# Patient Record
Sex: Female | Born: 1950
Health system: Southern US, Community
[De-identification: ages and names within clinical notes are randomized; demographics above are authoritative.]

## PROBLEM LIST (undated history)

## (undated) DIAGNOSIS — M1990 Unspecified osteoarthritis, unspecified site: Secondary | ICD-10-CM

## (undated) DIAGNOSIS — I2510 Atherosclerotic heart disease of native coronary artery without angina pectoris: Secondary | ICD-10-CM

## (undated) DIAGNOSIS — J449 Chronic obstructive pulmonary disease, unspecified: Secondary | ICD-10-CM

## (undated) DIAGNOSIS — IMO0002 Reserved for concepts with insufficient information to code with codable children: Secondary | ICD-10-CM

## (undated) DIAGNOSIS — D649 Anemia, unspecified: Secondary | ICD-10-CM

## (undated) DIAGNOSIS — R7989 Other specified abnormal findings of blood chemistry: Secondary | ICD-10-CM

## (undated) DIAGNOSIS — R748 Abnormal levels of other serum enzymes: Secondary | ICD-10-CM

## (undated) DIAGNOSIS — Z8719 Personal history of other diseases of the digestive system: Secondary | ICD-10-CM

## (undated) DIAGNOSIS — N390 Urinary tract infection, site not specified: Secondary | ICD-10-CM

## (undated) DIAGNOSIS — R0602 Shortness of breath: Secondary | ICD-10-CM

## (undated) DIAGNOSIS — E039 Hypothyroidism, unspecified: Secondary | ICD-10-CM

## (undated) DIAGNOSIS — I10 Essential (primary) hypertension: Secondary | ICD-10-CM

## (undated) DIAGNOSIS — E7800 Pure hypercholesterolemia, unspecified: Secondary | ICD-10-CM

## (undated) DIAGNOSIS — R9430 Abnormal result of cardiovascular function study, unspecified: Secondary | ICD-10-CM

## (undated) DIAGNOSIS — C801 Malignant (primary) neoplasm, unspecified: Secondary | ICD-10-CM

## (undated) DIAGNOSIS — M419 Scoliosis, unspecified: Secondary | ICD-10-CM

## (undated) DIAGNOSIS — I253 Aneurysm of heart: Secondary | ICD-10-CM

## (undated) DIAGNOSIS — G589 Mononeuropathy, unspecified: Secondary | ICD-10-CM

## (undated) DIAGNOSIS — F329 Major depressive disorder, single episode, unspecified: Secondary | ICD-10-CM

## (undated) DIAGNOSIS — F32A Depression, unspecified: Secondary | ICD-10-CM

## (undated) DIAGNOSIS — E785 Hyperlipidemia, unspecified: Secondary | ICD-10-CM

## (undated) DIAGNOSIS — K219 Gastro-esophageal reflux disease without esophagitis: Secondary | ICD-10-CM

## (undated) HISTORY — DX: Mononeuropathy, unspecified: G58.9

## (undated) HISTORY — DX: Aneurysm of heart: I25.3

## (undated) HISTORY — DX: Malignant (primary) neoplasm, unspecified: C80.1

## (undated) HISTORY — PX: OOPHORECTOMY: SHX86

## (undated) HISTORY — PX: BACK SURGERY: SHX140

## (undated) HISTORY — DX: Abnormal result of cardiovascular function study, unspecified: R94.30

## (undated) HISTORY — DX: Hyperlipidemia, unspecified: E78.5

## (undated) HISTORY — DX: Pure hypercholesterolemia, unspecified: E78.00

## (undated) HISTORY — DX: Essential (primary) hypertension: I10

## (undated) HISTORY — DX: Scoliosis, unspecified: M41.9

## (undated) HISTORY — DX: Abnormal levels of other serum enzymes: R74.8

## (undated) HISTORY — DX: Other specified abnormal findings of blood chemistry: R79.89

## (undated) HISTORY — DX: Atherosclerotic heart disease of native coronary artery without angina pectoris: I25.10

## (undated) HISTORY — PX: ANKLE SURGERY: SHX546

## (undated) HISTORY — DX: Hypothyroidism, unspecified: E03.9

## (undated) HISTORY — DX: Chronic obstructive pulmonary disease, unspecified: J44.9

## (undated) HISTORY — DX: Reserved for concepts with insufficient information to code with codable children: IMO0002

## (undated) HISTORY — DX: Shortness of breath: R06.02

---

## 1985-04-25 HISTORY — PX: TOTAL ABDOMINAL HYSTERECTOMY: SHX209

## 1985-04-25 HISTORY — PX: APPENDECTOMY: SHX54

## 1987-04-26 HISTORY — PX: PELVIC LAPAROSCOPY: SHX162

## 1998-05-14 ENCOUNTER — Other Ambulatory Visit: Admission: RE | Admit: 1998-05-14 | Discharge: 1998-05-14 | Payer: Self-pay | Admitting: Obstetrics and Gynecology

## 1999-09-01 ENCOUNTER — Other Ambulatory Visit: Admission: RE | Admit: 1999-09-01 | Discharge: 1999-09-01 | Payer: Self-pay | Admitting: Obstetrics and Gynecology

## 1999-11-26 ENCOUNTER — Other Ambulatory Visit: Admission: RE | Admit: 1999-11-26 | Discharge: 1999-11-26 | Payer: Self-pay | Admitting: Orthopaedic Surgery

## 2000-04-25 DIAGNOSIS — C801 Malignant (primary) neoplasm, unspecified: Secondary | ICD-10-CM

## 2000-04-25 HISTORY — DX: Malignant (primary) neoplasm, unspecified: C80.1

## 2000-09-28 ENCOUNTER — Other Ambulatory Visit: Admission: RE | Admit: 2000-09-28 | Discharge: 2000-09-28 | Payer: Self-pay | Admitting: Obstetrics and Gynecology

## 2001-04-25 HISTORY — PX: BREAST LUMPECTOMY: SHX2

## 2001-04-25 HISTORY — PX: CATARACT EXTRACTION: SUR2

## 2001-11-02 ENCOUNTER — Encounter: Payer: Self-pay | Admitting: General Surgery

## 2001-11-07 ENCOUNTER — Ambulatory Visit (HOSPITAL_COMMUNITY): Admission: RE | Admit: 2001-11-07 | Discharge: 2001-11-07 | Payer: Self-pay | Admitting: General Surgery

## 2001-11-07 ENCOUNTER — Encounter (INDEPENDENT_AMBULATORY_CARE_PROVIDER_SITE_OTHER): Payer: Self-pay | Admitting: *Deleted

## 2001-11-07 ENCOUNTER — Encounter: Admission: RE | Admit: 2001-11-07 | Discharge: 2001-11-07 | Payer: Self-pay | Admitting: General Surgery

## 2001-11-07 ENCOUNTER — Encounter: Payer: Self-pay | Admitting: General Surgery

## 2001-11-26 ENCOUNTER — Ambulatory Visit: Admission: RE | Admit: 2001-11-26 | Discharge: 2002-02-01 | Payer: Self-pay | Admitting: *Deleted

## 2002-02-22 ENCOUNTER — Other Ambulatory Visit: Admission: RE | Admit: 2002-02-22 | Discharge: 2002-02-22 | Payer: Self-pay | Admitting: Obstetrics and Gynecology

## 2002-09-26 ENCOUNTER — Encounter: Payer: Self-pay | Admitting: Internal Medicine

## 2002-09-26 ENCOUNTER — Inpatient Hospital Stay (HOSPITAL_COMMUNITY): Admission: RE | Admit: 2002-09-26 | Discharge: 2002-09-29 | Payer: Self-pay | Admitting: Internal Medicine

## 2003-02-21 ENCOUNTER — Ambulatory Visit: Admission: RE | Admit: 2003-02-21 | Discharge: 2003-02-21 | Payer: Self-pay | Admitting: *Deleted

## 2003-07-01 ENCOUNTER — Other Ambulatory Visit: Admission: RE | Admit: 2003-07-01 | Discharge: 2003-07-01 | Payer: Self-pay | Admitting: Obstetrics and Gynecology

## 2003-07-31 ENCOUNTER — Inpatient Hospital Stay (HOSPITAL_COMMUNITY): Admission: AD | Admit: 2003-07-31 | Discharge: 2003-08-02 | Payer: Self-pay | Admitting: Internal Medicine

## 2004-03-31 ENCOUNTER — Ambulatory Visit: Payer: Self-pay | Admitting: Internal Medicine

## 2004-04-09 ENCOUNTER — Ambulatory Visit: Payer: Self-pay | Admitting: Internal Medicine

## 2004-04-28 ENCOUNTER — Ambulatory Visit: Payer: Self-pay | Admitting: *Deleted

## 2004-08-23 ENCOUNTER — Encounter (INDEPENDENT_AMBULATORY_CARE_PROVIDER_SITE_OTHER): Payer: Self-pay | Admitting: *Deleted

## 2004-08-23 LAB — CONVERTED CEMR LAB

## 2004-09-06 ENCOUNTER — Ambulatory Visit: Payer: Self-pay | Admitting: Internal Medicine

## 2004-09-15 ENCOUNTER — Ambulatory Visit: Payer: Self-pay | Admitting: Internal Medicine

## 2005-02-07 ENCOUNTER — Ambulatory Visit: Payer: Self-pay | Admitting: Oncology

## 2005-03-25 ENCOUNTER — Ambulatory Visit: Payer: Self-pay | Admitting: Oncology

## 2005-05-09 ENCOUNTER — Ambulatory Visit: Payer: Self-pay | Admitting: Internal Medicine

## 2005-08-18 ENCOUNTER — Other Ambulatory Visit: Admission: RE | Admit: 2005-08-18 | Discharge: 2005-08-18 | Payer: Self-pay | Admitting: Obstetrics and Gynecology

## 2005-08-29 ENCOUNTER — Ambulatory Visit: Payer: Self-pay | Admitting: *Deleted

## 2005-09-07 ENCOUNTER — Ambulatory Visit: Payer: Self-pay

## 2005-09-07 ENCOUNTER — Ambulatory Visit: Payer: Self-pay | Admitting: *Deleted

## 2005-09-07 HISTORY — PX: CARDIOVASCULAR STRESS TEST: SHX262

## 2006-01-26 ENCOUNTER — Ambulatory Visit: Payer: Self-pay | Admitting: Internal Medicine

## 2006-03-01 ENCOUNTER — Ambulatory Visit: Payer: Self-pay | Admitting: Internal Medicine

## 2006-03-23 ENCOUNTER — Ambulatory Visit: Payer: Self-pay | Admitting: Internal Medicine

## 2006-03-23 ENCOUNTER — Ambulatory Visit: Payer: Self-pay | Admitting: Oncology

## 2006-04-17 LAB — CBC WITH DIFFERENTIAL/PLATELET
BASO%: 0.4 % (ref 0.0–2.0)
EOS%: 12.4 % — ABNORMAL HIGH (ref 0.0–7.0)
MCH: 30.6 pg (ref 26.0–34.0)
MCHC: 33.3 g/dL (ref 32.0–36.0)
MCV: 92 fL (ref 81.0–101.0)
MONO%: 7.2 % (ref 0.0–13.0)
NEUT%: 61.8 % (ref 39.6–76.8)
RDW: 12.8 % (ref 11.3–14.5)
lymph#: 1.2 10*3/uL (ref 0.9–3.3)

## 2006-05-19 DIAGNOSIS — G43909 Migraine, unspecified, not intractable, without status migrainosus: Secondary | ICD-10-CM | POA: Insufficient documentation

## 2006-07-26 ENCOUNTER — Ambulatory Visit: Payer: Self-pay | Admitting: *Deleted

## 2006-08-09 ENCOUNTER — Ambulatory Visit: Payer: Self-pay | Admitting: *Deleted

## 2006-08-09 LAB — CONVERTED CEMR LAB
Bilirubin, Direct: 0.1 mg/dL (ref 0.0–0.3)
Cholesterol: 167 mg/dL (ref 0–200)
GFR calc Af Amer: 111 mL/min
GFR calc non Af Amer: 92 mL/min
Glucose, Bld: 96 mg/dL (ref 70–99)
HDL: 48.5 mg/dL (ref >39.0)
LDL Cholesterol: 97 mg/dL (ref 0–99)
Sodium: 144 meq/L (ref 135–145)
Total CHOL/HDL Ratio: 3.4
Triglycerides: 106 mg/dL (ref 0–149)

## 2006-09-01 ENCOUNTER — Ambulatory Visit: Payer: Self-pay | Admitting: Internal Medicine

## 2006-09-01 LAB — CONVERTED CEMR LAB
BUN: 14 mg/dL (ref 6–23)
Basophils Absolute: 0 10*3/uL (ref 0.0–0.1)
Cholesterol: 154 mg/dL (ref 0–200)
Eosinophils Absolute: 0.6 10*3/uL (ref 0.0–0.6)
HCT: 39.9 % (ref 36.0–46.0)
Lymphocytes Relative: 14.5 % (ref 12.0–46.0)
MCHC: 33.1 g/dL (ref 30.0–36.0)
MCV: 91.2 fL (ref 78.0–100.0)
Neutro Abs: 4.2 10*3/uL (ref 1.4–7.7)
Neutrophils Relative %: 67.3 % (ref 43.0–77.0)
RBC: 4.38 M/uL (ref 3.87–5.11)
TSH: 3.32 microintl units/mL (ref 0.35–5.50)
Total CHOL/HDL Ratio: 3.2
Triglycerides: 74 mg/dL (ref 0–149)

## 2006-10-04 ENCOUNTER — Encounter: Payer: Self-pay | Admitting: Internal Medicine

## 2006-10-12 ENCOUNTER — Telehealth (INDEPENDENT_AMBULATORY_CARE_PROVIDER_SITE_OTHER): Payer: Self-pay | Admitting: *Deleted

## 2006-11-15 ENCOUNTER — Other Ambulatory Visit: Admission: RE | Admit: 2006-11-15 | Discharge: 2006-11-15 | Payer: Self-pay | Admitting: Obstetrics and Gynecology

## 2006-11-22 ENCOUNTER — Ambulatory Visit: Payer: Self-pay | Admitting: Cardiology

## 2006-11-22 LAB — CONVERTED CEMR LAB
Cholesterol: 149 mg/dL (ref 0–200)
HDL: 46 mg/dL (ref >39.0)
TSH: 5.97 microintl units/mL — ABNORMAL HIGH (ref 0.35–5.50)
Total CHOL/HDL Ratio: 3.2
Triglycerides: 65 mg/dL (ref 0–149)

## 2007-01-10 ENCOUNTER — Ambulatory Visit: Payer: Self-pay | Admitting: Internal Medicine

## 2007-02-08 ENCOUNTER — Ambulatory Visit: Payer: Self-pay | Admitting: Cardiology

## 2007-02-13 ENCOUNTER — Ambulatory Visit: Payer: Self-pay | Admitting: Cardiology

## 2007-02-13 ENCOUNTER — Ambulatory Visit: Payer: Self-pay

## 2007-02-13 ENCOUNTER — Encounter: Payer: Self-pay | Admitting: Cardiology

## 2007-02-13 ENCOUNTER — Encounter: Payer: Self-pay | Admitting: Internal Medicine

## 2007-02-15 ENCOUNTER — Ambulatory Visit: Payer: Self-pay | Admitting: Cardiology

## 2007-03-07 ENCOUNTER — Ambulatory Visit: Payer: Self-pay | Admitting: Oncology

## 2007-03-12 ENCOUNTER — Encounter: Payer: Self-pay | Admitting: Internal Medicine

## 2007-03-12 LAB — CBC WITH DIFFERENTIAL/PLATELET
Basophils Absolute: 0 10*3/uL (ref 0.0–0.1)
EOS%: 14.2 % — ABNORMAL HIGH (ref 0.0–7.0)
Eosinophils Absolute: 0.9 10*3/uL — ABNORMAL HIGH (ref 0.0–0.5)
HCT: 38.1 % (ref 34.8–46.6)
HGB: 13 g/dL (ref 11.6–15.9)
MCH: 29.2 pg (ref 26.0–34.0)
MONO#: 0.4 10*3/uL (ref 0.1–0.9)
NEUT#: 4.1 10*3/uL (ref 1.5–6.5)
NEUT%: 64.6 % (ref 39.6–76.8)
lymph#: 1 10*3/uL (ref 0.9–3.3)

## 2007-03-12 LAB — COMPREHENSIVE METABOLIC PANEL
BUN: 18 mg/dL (ref 6–23)
CO2: 24 mEq/L (ref 19–32)
Calcium: 9.7 mg/dL (ref 8.4–10.5)
Chloride: 107 mEq/L (ref 96–112)
Creatinine, Ser: 0.69 mg/dL (ref 0.40–1.20)
Glucose, Bld: 94 mg/dL (ref 70–99)

## 2007-03-12 LAB — LACTATE DEHYDROGENASE: LDH: 234 U/L (ref 94–250)

## 2007-03-28 ENCOUNTER — Telehealth (INDEPENDENT_AMBULATORY_CARE_PROVIDER_SITE_OTHER): Payer: Self-pay | Admitting: *Deleted

## 2007-04-05 ENCOUNTER — Ambulatory Visit: Payer: Self-pay | Admitting: Internal Medicine

## 2007-04-09 ENCOUNTER — Encounter (INDEPENDENT_AMBULATORY_CARE_PROVIDER_SITE_OTHER): Payer: Self-pay | Admitting: *Deleted

## 2007-08-09 ENCOUNTER — Ambulatory Visit: Payer: Self-pay | Admitting: Internal Medicine

## 2007-08-22 ENCOUNTER — Encounter: Payer: Self-pay | Admitting: Internal Medicine

## 2007-10-16 ENCOUNTER — Encounter: Payer: Self-pay | Admitting: Internal Medicine

## 2007-12-07 ENCOUNTER — Encounter
Admission: RE | Admit: 2007-12-07 | Discharge: 2008-03-06 | Payer: Self-pay | Admitting: Physical Medicine & Rehabilitation

## 2007-12-11 ENCOUNTER — Ambulatory Visit: Payer: Self-pay | Admitting: Physical Medicine & Rehabilitation

## 2008-01-14 ENCOUNTER — Ambulatory Visit: Payer: Self-pay | Admitting: Physical Medicine & Rehabilitation

## 2008-01-23 ENCOUNTER — Ambulatory Visit: Payer: Self-pay | Admitting: Cardiology

## 2008-01-23 LAB — CONVERTED CEMR LAB
Albumin: 3.9 g/dL (ref 3.5–5.2)
HDL: 57.8 mg/dL (ref >39.0)
LDL Cholesterol: 95 mg/dL (ref 0–99)
TSH: 6.02 microintl units/mL — ABNORMAL HIGH (ref 0.35–5.50)
Total Bilirubin: 0.8 mg/dL (ref 0.3–1.2)
Total CHOL/HDL Ratio: 3
Triglycerides: 91 mg/dL (ref 0–149)
VLDL: 18 mg/dL (ref 0–40)

## 2008-02-11 ENCOUNTER — Ambulatory Visit: Payer: Self-pay | Admitting: Physical Medicine & Rehabilitation

## 2008-02-19 ENCOUNTER — Ambulatory Visit: Payer: Self-pay | Admitting: Family Medicine

## 2008-02-22 ENCOUNTER — Ambulatory Visit: Payer: Self-pay | Admitting: Internal Medicine

## 2008-03-07 ENCOUNTER — Ambulatory Visit: Payer: Self-pay | Admitting: Oncology

## 2008-03-11 ENCOUNTER — Encounter: Payer: Self-pay | Admitting: Internal Medicine

## 2008-03-11 LAB — CBC WITH DIFFERENTIAL/PLATELET
BASO%: 0.5 % (ref 0.0–2.0)
Basophils Absolute: 0 10*3/uL (ref 0.0–0.1)
EOS%: 5.4 % (ref 0.0–7.0)
HGB: 13.5 g/dL (ref 11.6–15.9)
MCH: 31.5 pg (ref 26.0–34.0)
MCHC: 33.7 g/dL (ref 32.0–36.0)
RBC: 4.28 10*6/uL (ref 3.70–5.32)
RDW: 14.9 % — ABNORMAL HIGH (ref 11.3–14.5)
lymph#: 1 10*3/uL (ref 0.9–3.3)

## 2008-03-11 LAB — COMPREHENSIVE METABOLIC PANEL
ALT: 20 U/L (ref 0–35)
AST: 16 U/L (ref 0–37)
Albumin: 4.3 g/dL (ref 3.5–5.2)
Alkaline Phosphatase: 93 U/L (ref 39–117)
BUN: 14 mg/dL (ref 6–23)
CO2: 27 mEq/L (ref 19–32)
Calcium: 9.3 mg/dL (ref 8.4–10.5)
Chloride: 105 mEq/L (ref 96–112)
Creatinine, Ser: 0.78 mg/dL (ref 0.40–1.20)
Glucose, Bld: 90 mg/dL (ref 70–99)
Potassium: 4.2 mEq/L (ref 3.5–5.3)
Sodium: 143 mEq/L (ref 135–145)
Total Bilirubin: 0.6 mg/dL (ref 0.3–1.2)
Total Protein: 6.6 g/dL (ref 6.0–8.3)

## 2008-03-13 ENCOUNTER — Encounter
Admission: RE | Admit: 2008-03-13 | Discharge: 2008-03-13 | Payer: Self-pay | Admitting: Physical Medicine & Rehabilitation

## 2008-03-13 ENCOUNTER — Ambulatory Visit: Payer: Self-pay | Admitting: Physical Medicine & Rehabilitation

## 2008-03-17 ENCOUNTER — Encounter
Admission: RE | Admit: 2008-03-17 | Discharge: 2008-04-24 | Payer: Self-pay | Admitting: Physical Medicine & Rehabilitation

## 2008-03-18 ENCOUNTER — Encounter: Payer: Self-pay | Admitting: Internal Medicine

## 2008-06-23 ENCOUNTER — Ambulatory Visit: Payer: Self-pay | Admitting: Obstetrics and Gynecology

## 2008-07-02 ENCOUNTER — Ambulatory Visit: Payer: Self-pay | Admitting: Obstetrics and Gynecology

## 2008-07-21 ENCOUNTER — Ambulatory Visit: Payer: Self-pay | Admitting: Internal Medicine

## 2008-10-13 ENCOUNTER — Encounter
Admission: RE | Admit: 2008-10-13 | Discharge: 2008-10-31 | Payer: Self-pay | Admitting: Physical Medicine & Rehabilitation

## 2008-10-14 ENCOUNTER — Ambulatory Visit: Payer: Self-pay | Admitting: Physical Medicine & Rehabilitation

## 2008-10-23 ENCOUNTER — Ambulatory Visit: Payer: Self-pay | Admitting: Physical Medicine & Rehabilitation

## 2008-10-30 ENCOUNTER — Telehealth (INDEPENDENT_AMBULATORY_CARE_PROVIDER_SITE_OTHER): Payer: Self-pay | Admitting: *Deleted

## 2008-10-31 ENCOUNTER — Ambulatory Visit (HOSPITAL_COMMUNITY)
Admission: RE | Admit: 2008-10-31 | Discharge: 2008-10-31 | Payer: Self-pay | Admitting: Physical Medicine & Rehabilitation

## 2008-10-31 ENCOUNTER — Ambulatory Visit: Payer: Self-pay | Admitting: Physical Medicine & Rehabilitation

## 2008-11-21 ENCOUNTER — Ambulatory Visit: Payer: Self-pay | Admitting: Internal Medicine

## 2009-01-02 ENCOUNTER — Ambulatory Visit (HOSPITAL_COMMUNITY): Admission: RE | Admit: 2009-01-02 | Discharge: 2009-01-02 | Payer: Self-pay | Admitting: Orthopaedic Surgery

## 2009-01-06 ENCOUNTER — Encounter (INDEPENDENT_AMBULATORY_CARE_PROVIDER_SITE_OTHER): Payer: Self-pay | Admitting: *Deleted

## 2009-01-14 ENCOUNTER — Ambulatory Visit: Payer: Self-pay | Admitting: Obstetrics and Gynecology

## 2009-01-20 ENCOUNTER — Encounter
Admission: RE | Admit: 2009-01-20 | Discharge: 2009-04-20 | Payer: Self-pay | Admitting: Physical Medicine & Rehabilitation

## 2009-01-23 ENCOUNTER — Ambulatory Visit: Payer: Self-pay | Admitting: Physical Medicine & Rehabilitation

## 2009-02-19 ENCOUNTER — Encounter: Payer: Self-pay | Admitting: Cardiology

## 2009-02-20 ENCOUNTER — Ambulatory Visit: Payer: Self-pay | Admitting: Cardiology

## 2009-02-20 ENCOUNTER — Ambulatory Visit: Payer: Self-pay | Admitting: Physical Medicine & Rehabilitation

## 2009-02-25 LAB — CONVERTED CEMR LAB
ALT: 22 units/L (ref 0–35)
Albumin: 3.8 g/dL (ref 3.5–5.2)
Cholesterol: 216 mg/dL — ABNORMAL HIGH (ref 0–200)
Direct LDL: 154.8 mg/dL
HDL: 50.6 mg/dL (ref >39.00)
Total Protein: 6.5 g/dL (ref 6.0–8.3)
Triglycerides: 95 mg/dL (ref 0.0–149.0)
VLDL: 19 mg/dL (ref 0.0–40.0)

## 2009-03-17 ENCOUNTER — Encounter: Payer: Self-pay | Admitting: Internal Medicine

## 2009-03-20 ENCOUNTER — Ambulatory Visit: Payer: Self-pay | Admitting: Oncology

## 2009-03-24 ENCOUNTER — Encounter: Payer: Self-pay | Admitting: Internal Medicine

## 2009-03-24 LAB — CBC WITH DIFFERENTIAL/PLATELET
Basophils Absolute: 0 10*3/uL (ref 0.0–0.1)
Eosinophils Absolute: 0.5 10*3/uL (ref 0.0–0.5)
HCT: 39.5 % (ref 34.8–46.6)
HGB: 13.1 g/dL (ref 11.6–15.9)
MCV: 95.5 fL (ref 79.5–101.0)
MONO%: 8.4 % (ref 0.0–14.0)
NEUT#: 3 10*3/uL (ref 1.5–6.5)
NEUT%: 54.9 % (ref 38.4–76.8)
RDW: 13.8 % (ref 11.2–14.5)

## 2009-03-25 LAB — CANCER ANTIGEN 27.29: CA 27.29: 24 U/mL (ref 0–39)

## 2009-03-25 LAB — COMPREHENSIVE METABOLIC PANEL
Albumin: 4.1 g/dL (ref 3.5–5.2)
Alkaline Phosphatase: 81 U/L (ref 39–117)
BUN: 14 mg/dL (ref 6–23)
Calcium: 9.6 mg/dL (ref 8.4–10.5)
Chloride: 105 mEq/L (ref 96–112)
Creatinine, Ser: 0.64 mg/dL (ref 0.40–1.20)
Glucose, Bld: 88 mg/dL (ref 70–99)
Potassium: 4.5 mEq/L (ref 3.5–5.3)

## 2009-03-25 LAB — VITAMIN D 25 HYDROXY (VIT D DEFICIENCY, FRACTURES): Vit D, 25-Hydroxy: 22 ng/mL — ABNORMAL LOW (ref 30–89)

## 2009-04-14 ENCOUNTER — Ambulatory Visit: Payer: Self-pay | Admitting: Cardiology

## 2009-04-27 ENCOUNTER — Encounter: Payer: Self-pay | Admitting: Cardiology

## 2009-04-27 LAB — CONVERTED CEMR LAB
Albumin: 3.8 g/dL (ref 3.5–5.2)
Cholesterol: 158 mg/dL (ref 0–200)
LDL Cholesterol: 84 mg/dL (ref 0–99)
Total Bilirubin: 0.7 mg/dL (ref 0.3–1.2)
Triglycerides: 73 mg/dL (ref 0.0–149.0)
VLDL: 14.6 mg/dL (ref 0.0–40.0)

## 2009-05-15 ENCOUNTER — Telehealth (INDEPENDENT_AMBULATORY_CARE_PROVIDER_SITE_OTHER): Payer: Self-pay | Admitting: *Deleted

## 2009-05-20 ENCOUNTER — Encounter
Admission: RE | Admit: 2009-05-20 | Discharge: 2009-05-21 | Payer: Self-pay | Admitting: Physical Medicine & Rehabilitation

## 2009-05-21 ENCOUNTER — Ambulatory Visit: Payer: Self-pay | Admitting: Physical Medicine & Rehabilitation

## 2009-06-04 ENCOUNTER — Telehealth: Payer: Self-pay | Admitting: Internal Medicine

## 2009-06-17 ENCOUNTER — Encounter (INDEPENDENT_AMBULATORY_CARE_PROVIDER_SITE_OTHER): Payer: Self-pay | Admitting: *Deleted

## 2009-06-17 ENCOUNTER — Ambulatory Visit: Payer: Self-pay | Admitting: Internal Medicine

## 2009-06-23 ENCOUNTER — Encounter: Payer: Self-pay | Admitting: Internal Medicine

## 2009-08-10 ENCOUNTER — Ambulatory Visit: Payer: Self-pay | Admitting: Cardiology

## 2009-08-11 ENCOUNTER — Encounter
Admission: RE | Admit: 2009-08-11 | Discharge: 2009-08-11 | Payer: Self-pay | Admitting: Physical Medicine & Rehabilitation

## 2009-08-11 ENCOUNTER — Ambulatory Visit: Payer: Self-pay | Admitting: Physical Medicine & Rehabilitation

## 2009-10-27 ENCOUNTER — Ambulatory Visit: Payer: Self-pay | Admitting: Physical Medicine & Rehabilitation

## 2009-10-27 ENCOUNTER — Encounter
Admission: RE | Admit: 2009-10-27 | Discharge: 2009-10-27 | Payer: Self-pay | Admitting: Physical Medicine & Rehabilitation

## 2009-11-03 ENCOUNTER — Encounter: Payer: Self-pay | Admitting: Cardiology

## 2009-12-24 ENCOUNTER — Ambulatory Visit: Payer: Self-pay | Admitting: Internal Medicine

## 2009-12-24 DIAGNOSIS — R5383 Other fatigue: Secondary | ICD-10-CM

## 2009-12-24 DIAGNOSIS — R5381 Other malaise: Secondary | ICD-10-CM | POA: Insufficient documentation

## 2009-12-24 DIAGNOSIS — F32A Depression, unspecified: Secondary | ICD-10-CM | POA: Insufficient documentation

## 2009-12-24 DIAGNOSIS — F329 Major depressive disorder, single episode, unspecified: Secondary | ICD-10-CM

## 2009-12-28 ENCOUNTER — Encounter: Payer: Self-pay | Admitting: Internal Medicine

## 2009-12-30 ENCOUNTER — Ambulatory Visit: Payer: Self-pay | Admitting: Cardiology

## 2010-01-01 ENCOUNTER — Encounter: Payer: Self-pay | Admitting: Cardiology

## 2010-01-01 LAB — CONVERTED CEMR LAB
ALT: 24 units/L (ref 0–35)
AST: 34 units/L (ref 0–37)
Alkaline Phosphatase: 96 units/L (ref 39–117)
BUN: 14 mg/dL (ref 6–23)
Basophils Absolute: 0 10*3/uL (ref 0.0–0.1)
Bilirubin, Direct: 0.1 mg/dL (ref 0.0–0.3)
Chloride: 104 meq/L (ref 96–112)
Cholesterol: 129 mg/dL (ref 0–200)
Creatinine, Ser: 0.6 mg/dL (ref 0.4–1.2)
Eosinophils Absolute: 0.5 10*3/uL (ref 0.0–0.7)
Glucose, Bld: 80 mg/dL (ref 70–99)
HCT: 36.4 % (ref 36.0–46.0)
Hemoglobin: 12.2 g/dL (ref 12.0–15.0)
Lymphs Abs: 0.8 10*3/uL (ref 0.7–4.0)
MCHC: 33.6 g/dL (ref 30.0–36.0)
Monocytes Absolute: 0.4 10*3/uL (ref 0.1–1.0)
Monocytes Relative: 8.1 % (ref 3.0–12.0)
Neutro Abs: 3.3 10*3/uL (ref 1.4–7.7)
Platelets: 267 10*3/uL (ref 150.0–400.0)
Potassium: 4.6 meq/L (ref 3.5–5.1)
RDW: 13.3 % (ref 11.5–14.6)
TSH: 9.65 microintl units/mL — ABNORMAL HIGH (ref 0.35–5.50)
Total Bilirubin: 0.5 mg/dL (ref 0.3–1.2)
Total Protein: 6.2 g/dL (ref 6.0–8.3)

## 2010-01-20 ENCOUNTER — Encounter
Admission: RE | Admit: 2010-01-20 | Discharge: 2010-04-20 | Payer: Self-pay | Source: Home / Self Care | Attending: Physical Medicine & Rehabilitation | Admitting: Physical Medicine & Rehabilitation

## 2010-01-26 ENCOUNTER — Ambulatory Visit: Payer: Self-pay | Admitting: Physical Medicine & Rehabilitation

## 2010-03-02 ENCOUNTER — Ambulatory Visit: Payer: Self-pay | Admitting: Physical Medicine & Rehabilitation

## 2010-03-23 ENCOUNTER — Encounter: Payer: Self-pay | Admitting: Internal Medicine

## 2010-04-27 ENCOUNTER — Telehealth: Payer: Self-pay | Admitting: Internal Medicine

## 2010-04-29 ENCOUNTER — Encounter: Payer: Self-pay | Admitting: Internal Medicine

## 2010-05-12 ENCOUNTER — Telehealth: Payer: Self-pay | Admitting: Internal Medicine

## 2010-05-25 NOTE — Miscellaneous (Signed)
Summary: Orders Update  Clinical Lists Changes  Orders: Added new Test order of TLB-BMP (Basic Metabolic Panel-BMET) (80048-METABOL) - Signed Added new Test order of TLB-TSH (Thyroid Stimulating Hormone) (84443-TSH) - Signed Added new Test order of TLB-CBC Platelet - w/Differential (85025-CBCD) - Signed 

## 2010-05-25 NOTE — Progress Notes (Signed)
Summary: sample qvar,proair  Phone Note Call from Patient Call back at Home Phone [phone]   Caller: Patient Summary of Call: pt left VM that she is currently out of work and dr hopper usually will give her a sample on inhaler to help out with cost. pt would like to know if we have any samples of this medication. called pt back informed pt that we do have med. pt was given 1 qvar sample and 1 proair sample, pt aware samples ready for pick-up.........Marland KitchenFelecia Deloach CMA  May 15, 2009 11:17 AM

## 2010-05-25 NOTE — Letter (Signed)
Summary: Appointment - Reminder 2  [facility], Main Office  [address]   [place], [place] [address]   Phone: [phone]  Fax: [phone]     June 17, 2009 MRN: [id number]   [facility] [place] [address] [place], [place]  [address]   Dear [name],  Our records indicate that it is time to schedule a follow-up appointment with Dr. Myrtis Ser. It is very important that we reach you to schedule this appointment. We look forward to participating in your health care needs. Please contact us at the number listed above at your earliest convenience to schedule your appointment.  If you are unable to make an appointment at this time, give [place] a call so we can update our records.     Sincerely,   Migdalia Dk [facility] Scheduling Team

## 2010-05-25 NOTE — Letter (Signed)
Summary: Health Assessment/[employer]  Health Assessment/[employer]   Imported By: Lanelle Bal 01/18/2010 12:58:55  _____________________________________________________________________  External Attachment:    Type:   Image     Comment:   External Document

## 2010-05-25 NOTE — Progress Notes (Signed)
Summary: Disability concerns  Phone Note Call from Patient Call back at Home Phone [phone]   Caller: Patient Summary of Call: Message left on VM: Please contact patient to dicuss disability  I called patient and she said that we need to contact Hedi at disablity [phone] [id number] I called Hedi and she would like for Dr.Taquan Bralley to  1.) ok for patient to have PFTs  2.) ? If cane use is medical necessary  Dr.Desare Duddy please advise./Ch[facility]  June 04, 2009 9:01 AM   Follow-up for Phone Call        PFTS OK (Code asthma, COAD); cane should prescribed by her Orthopedist or Physical Therapy Follow-up by: Marga Melnick MD,  June 04, 2009 9:41 AM  Additional Follow-up for Phone Call Additional follow up Details #1::        verbal ok given to [facility] at disablity................Marland KitchenFelecia Deloach CMA  June 04, 2009 10:20 AM     Additional Follow-up for Phone Call Additional follow up Details #2::    PT SCHEDULED FOR PFTs ON 06-17-2009 & PATIENT IS AWARE.  Follow-up by: Magdalen Spatz [facility],  June 08, 2009 11:11 AM

## 2010-05-25 NOTE — Miscellaneous (Signed)
Summary: Orders Update pft charges  Clinical Lists Changes  Orders: Added new Service order of Carbon Monoxide diffusing w/capacity (94720) - Signed Added new Service order of Lung Volumes (94240) - Signed Added new Service order of Spirometry (Pre & Post) (94060) - Signed 

## 2010-05-25 NOTE — Letter (Signed)
Summary: Custom - Lipid  [facility], Main Office  [address]   [place], [place] [address]   Phone: [phone]  Fax: [phone]     April 27, 2009 MRN: [id number]   [facility] [place] [address] [place], [place]  [address]   Dear Ms. Sedalia Muta,  We have reviewed your cholesterol results.  They are as follows:     Total Cholesterol:    158 (Desirable: less than 200)       HDL  Cholesterol:     59.20  (Desirable: greater than 40 for men and 50 for women)       LDL Cholesterol:       84  (Desirable: less than 100 for low risk and less than 70 for moderate to high risk)       Triglycerides:       73.0  (Desirable: less than 150)  Our recommendations include:  LDL much improved on Simvastatin, continue current meds   Call our office at the number listed above if you have any questions.  Lowering your LDL cholesterol is important, but it is only one of a large number of "risk factors" that may indicate that you are at risk for heart disease, stroke or other complications of hardening of the arteries.  Other risk factors include:   A.  Cigarette Smoking* B.  High Blood Pressure* C.  Obesity* D.   Low HDL Cholesterol (see yours above)* E.   Diabetes Mellitus (higher risk if your is uncontrolled) F.  Family history of premature heart disease G.  Previous history of stroke or cardiovascular disease    *These are risk factors YOU HAVE CONTROL OVER.  For more information, visit .  There is now evidence that lowering the TOTAL CHOLESTEROL AND LDL CHOLESTEROL can reduce the risk of heart disease.  The American Heart Association recommends the following guidelines for the treatment of elevated cholesterol:  1.  If there is now current heart disease and less than two risk factors, TOTAL CHOLESTEROL should be less than 200 and LDL CHOLESTEROL should be less than 100. 2.  If there is current heart disease or two or more risk factors, TOTAL CHOLESTEROL should be less than 200 and  LDL CHOLESTEROL should be less than 70.  A diet low in cholesterol, saturated fat, and calories is the cornerstone of treatment for elevated cholesterol.  Cessation of smoking and exercise are also important in the management of elevated cholesterol and preventing vascular disease.  Studies have shown that 30 to 60 minutes of physical activity most days can help lower blood pressure, lower cholesterol, and keep your weight at a healthy level.  Drug therapy is used when cholesterol levels do not respond to therapeutic lifestyle changes (smoking cessation, diet, and exercise) and remains unacceptably high.  If medication is started, it is important to have you levels checked periodically to evaluate the need for further treatment options.  Thank you,    [facility] Team

## 2010-05-25 NOTE — Letter (Signed)
Summary: Custom - Lipid  [facility], Main Office  [address]   [place], [place] [address]   Phone: [phone]  Fax: [phone]     January 01, 2010 MRN: [id number]   [facility] Hannah Scott [address] [place], [place]  [address]   Dear [name],  We have reviewed your cholesterol results.  They are as follows:     Total Cholesterol:    129 (Desirable: less than 200)       HDL  Cholesterol:     49.50  (Desirable: greater than 40 for men and 50 for women)       LDL Cholesterol:       71  (Desirable: less than 100 for low risk and less than 70 for moderate to high risk)       Triglycerides:       42.0  (Desirable: less than 150)  Our recommendations include:  Looks Good, continue current medications   Call our office at the number listed above if you have any questions.  Lowering your LDL cholesterol is important, but it is only one of a large number of "risk factors" that may indicate that you are at risk for heart disease, stroke or other complications of hardening of the arteries.  Other risk factors include:   A.  Cigarette Smoking* B.  High Blood Pressure* C.  Obesity* D.   Low HDL Cholesterol (see yours above)* E.   Diabetes Mellitus (higher risk if your is uncontrolled) F.  Family history of premature heart disease G.  Previous history of stroke or cardiovascular disease    *These are risk factors YOU HAVE CONTROL OVER.  For more information, visit .  There is now evidence that lowering the TOTAL CHOLESTEROL AND LDL CHOLESTEROL can reduce the risk of heart disease.  The American Heart Association recommends the following guidelines for the treatment of elevated cholesterol:  1.  If there is now current heart disease and less than two risk factors, TOTAL CHOLESTEROL should be less than 200 and LDL CHOLESTEROL should be less than 100. 2.  If there is current heart disease or two or more risk factors, TOTAL CHOLESTEROL should be less than 200 and LDL  CHOLESTEROL should be less than 70.  A diet low in cholesterol, saturated fat, and calories is the cornerstone of treatment for elevated cholesterol.  Cessation of smoking and exercise are also important in the management of elevated cholesterol and preventing vascular disease.  Studies have shown that 30 to 60 minutes of physical activity most days can help lower blood pressure, lower cholesterol, and keep your weight at a healthy level.  Drug therapy is used when cholesterol levels do not respond to therapeutic lifestyle changes (smoking cessation, diet, and exercise) and remains unacceptably high.  If medication is started, it is important to have you levels checked periodically to evaluate the need for further treatment options.  Thank you,    [facility] Team

## 2010-05-25 NOTE — Assessment & Plan Note (Signed)
Summary: per check out/sf  Medications Added NORTRIPTYLINE HCL 50 MG CAPS (NORTRIPTYLINE HCL) once daily      Allergies Added:   Visit Type:  Follow-up Primary Provider:  Marga Melnick MD  CC:  CAD.  History of Present Illness: The patient is seen for follow up of coronary disease.  I saw her last October, 2010.  She's not been having any significant chest pain.  She has known coronary disease.  This has been mild in the past.  Her last Myoview was in 2008.  Current Medications (verified): 1)  Amlodipine Besylate 5 Mg Tabs (Amlodipine Besylate) .... Take One-Half Tablet By Mouth Daily 2)  Lotensin 40 Mg Tabs (Benazepril Hcl) .... 1/2 By Mouth Once Daily 3)  Metoprolol Tartrate 25 Mg  Tabs (Metoprolol Tartrate) .[name].. 1 By Mouth Bid 4)  Ipratropium-Albuterol 0.5-2.5 (3) Mg/56ml Soln (Ipratropium-Albuterol) .... As Directed 5)  Xopenex 1.25 Mg/17ml Nebu (Levalbuterol Hcl) .[name].. 1 Amp Every 6 Hrs As Needed 6)  Proair Hfa 108 (90 Base) Mcg/act Aers (Albuterol Sulfate) .[name].. 1-2 Puffs As Needed 7)  Qvar 80 Mcg/act Aers (Beclomethasone Dipropionate) .... 2 Puffs Every 12 Hrs ; Gargle & Swallow After Use 8)  Hydrocodone-Acetaminophen 10-325 Mg Tabs (Hydrocodone-Acetaminophen) .... Three Times A Day 9)  Aspirin 81 Mg Tbec (Aspirin) .... Take One Tablet By Mouth Daily 10)  Nortriptyline Hcl 50 Mg Caps (Nortriptyline Hcl) .... Once Daily 11)  Simvastatin 40 Mg Tabs (Simvastatin) .... Take One Tablet By Mouth Daily At Bedtime  Allergies (verified): 1)  ! * Advair  Past History:  Past Medical History: Asthma...PFTs February, 2011 moderate obstructive airway disease with response to bronchodilators.  Normal lung volumes.  Moderate reduction in diffusion capacity. Hyperlipidemia Hypertension Hypothyroidism....reassessed over time...[name] patient tells me she does not need treatment. Myocardial infarction, hx of CAD...90%  distal LAD in past. /  ..myoview 2008.. EF 70%..no ischemia EF  55-60%...echo..01/2007.. Atrial septal aneurysm...echo...2008 Pinched nerve lower back Breast cancer ...treated Kyphoscoliosis Shortness of breath Back pain  Overweight  Review of Systems       Patient denies fever, chills, headache, sweats, rash, change in vision, change in hearing, chest pain, cough, nausea vomiting, urinary symptoms.  All of the systems are reviewed and are negative.  Vital Signs:  Patient profile:   60 year old female Height:      62 inches Weight:      168 pounds BMI:     30.84 Pulse rate:   65 / minute BP sitting:   124 / 80  (left arm) Cuff size:   regular  Vitals Entered By: Hardin Negus, RMA (August 10, 2009 11:49 AM)  Physical Exam  General:  patient is stable. Eyes:  no xanthelasma. Neck:  no jugular venous distention. Lungs:  lungs are clear.  Respiratory not labored. Heart:  cardiac exam reveals S1-S2.  No clicks or significant murmurs Abdomen:  abdomen soft. Extremities:  no peripheral edema. Psych:  patient is oriented to person time and place her affect is normal.   Impression & Recommendations:  Problem # 1:  SHORTNESS OF BREATH (ICD-786.05)  Her updated medication list for this problem includes:    Amlodipine Besylate 5 Mg Tabs (Amlodipine besylate) .[name]... Take one-half tablet by mouth daily    Lotensin 40 Mg Tabs (Benazepril hcl) .[name]... 1/2 by mouth once daily    Metoprolol Tartrate 25 Mg Tabs (Metoprolol tartrate) .[name]... 1 by mouth bid    Aspirin 81 Mg Tbec (Aspirin) .[name]... Take one tablet by mouth daily  The patient has pulmonary disease.  She is on medications for it.  She is not having shortness of breath on a cardiac basis.  Problem # 2:  CAD (ICD-414.00)  Her updated medication list for this problem includes:    Amlodipine Besylate 5 Mg Tabs (Amlodipine besylate) .[name]... Take one-half tablet by mouth daily    Lotensin 40 Mg Tabs (Benazepril hcl) .[name]... 1/2 by mouth once daily    Metoprolol Tartrate 25 Mg Tabs (Metoprolol  tartrate) .[name]... 1 by mouth bid    Aspirin 81 Mg Tbec (Aspirin) .[name]... Take one tablet by mouth daily Coronary disease is stable.  No further workup.  Problem # 3:  SCOLIOSIS (ICD-737.30) The patient has significant scoliosis and is being considered for disability.  Problem # 4:  HYPERLIPIDEMIA (ICD-272.4)  Her updated medication list for this problem includes:    Simvastatin 40 Mg Tabs (Simvastatin) .[name]... Take one tablet by mouth daily at bedtime Lipids are being treated.  Patient Instructions: 1)  Follow up in 1 year

## 2010-05-25 NOTE — Assessment & Plan Note (Signed)
Summary: FOR MED REFILL//PH   Vital Signs:  Patient profile:   60 year old female Height:      59.5 inches Weight:      166.4 pounds BMI:     33.17 Temp:     98.3 degrees F oral Pulse rate:   80 / minute Resp:     18 per minute BP sitting:   118 / 70  (left arm) Cuff size:   large  Vitals Entered By: Shonna Chock CMA (December 24, 2009 10:55 AM) CC: CPX , patient states heart and chlosterol followed by Dr.Katz, COPD follow-up   Primary Care Provider:  Marga Melnick MD  CC:  CPX , patient states heart and chlosterol followed by Dr.Katz, and COPD follow-up.  History of Present Illness: Hypertension Follow-Up      This is a 60 year old woman who presents for Hypertension follow-up.  The patient reports fatigue(PMH of replacement for  hypothyroidism), but denies lightheadedness, urinary frequency, headaches, and rash.  Associated symptoms include exercise intolerance due to back issues  and dyspnea , mainly from deconditioning rather than COAD.  The patient denies the following associated symptoms: chest pain, chest pressure, palpitations, syncope, leg edema, and pedal edema.  Compliance with medications (by patient report) has been near 100%.  The patient reports that dietary compliance has been good.  The patient reports no exercise.  Adjunctive measures currently used by the patient include modified salt restriction.  BP @ home 120-/70-80. Asthma/COPD Follow-Up      The patient also presents for  Asthma/COPD follow-up.  The patient denies wheezing, cough, increased sputum, and nocturnal awakening.  Symptoms occur monthly.  The patient reports limitation of most activities.  Current treatment includes home nebulizer, MDI with spacer, and annual flu shot.Rescue used < 1 X /month.  Medication use includes controller med daily, QVAR 1 puff regularly & 2  with flares.  Symptoms are worse with exercise.    Allergies: 1)  ! * Advair  Review of Systems General:  Denies chills and fever;  Weight down with stress of illness. Eyes:  Denies blurring, double vision, and vision loss-both eyes. ENT:  Denies difficulty swallowing and hoarseness. GI:  Denies bloody stools, constipation, dark tarry stools, and diarrhea. MS:  Complains of low back pain; Dr Ophelia Charter, Ortho  & Dr Wynn Banker, Pain Center treat her back. Derm:  Denies changes in nail beds, dryness, and hair loss. Neuro:  Denies tingling; Numbness RLE from back after standing. Psych:  Complains of anxiety, depression, and easily tearful; denies easily angered and irritability. Endo:  Denies cold intolerance and heat intolerance.  Physical Exam  General:  Deconditioned but in no acute distress; alert,appropriate and cooperative throughout examination Neck:  No deformities, masses, or tenderness noted. Physiologic asymmetry of thyroid w/o nodules Lungs:  Normal respiratory effort, chest expands symmetrically. Lungs are clear to auscultation, no crackles or wheezes. Heart:  Normal rate and regular rhythm. S1  normal without gallop, murmur, click, rub . S2 increased Abdomen:  Bowel sounds positive,abdomen soft and non-tender without masses, organomegaly or hernias noted. Msk:  Scoliosis Pulses:  R and L carotid,radial,dorsalis pedis and posterior tibial pulses are full and equal bilaterally Extremities:  No clubbing, cyanosis, edema. OA finger changes  Neurologic:  alert & oriented X3 and DTRs symmetrical  but 0+ @ knees Skin:  Intact without suspicious lesions or rashes Cervical Nodes:  No lymphadenopathy noted Axillary Nodes:  No palpable lymphadenopathy Psych:  memory intact for recent and remote, flat  affect, and subdued.     Impression & Recommendations:  Problem # 1:  HYPERTENSION, ESSENTIAL NOS (ICD-401.9) well controlledHer updated medication list for this problem includes:    Lotensin 40 Mg Tabs (Benazepril hcl) .[name]... 1/2 by mouth once daily,    Metoprolol Tartrate 25 Mg Tabs (Metoprolol tartrate) .[name]... 1 by mouth  two times a day  Problem # 2:  FATIGUE (ICD-780.79) in context of PMH hypothyroidism  Problem # 3:  ASTHMA (ICD-493.90) Mixed asthma/ COAD The following medications were removed from the medication list:    Xopenex 1.25 Mg/74ml Nebu (Levalbuterol hcl) .[name]... 1 amp every 6 hrs as needed Her updated medication list for this problem includes:    Ipratropium-albuterol 0.5-2.5 (3) Mg/46ml Soln (Ipratropium-albuterol) .[name]... As directed    Proair Hfa 108 (90 Base) Mcg/act Aers (Albuterol sulfate) .[name]... 1-2 puffs as needed    Qvar 80 Mcg/act Aers (Beclomethasone dipropionate) .[name]... 2 puffs every 12 hrs ; gargle & swallow after use  Problem # 4:  HYPERLIPIDEMIA (ICD-272.4) as per Dr Myrtis Ser Her updated medication list for this problem includes:    Lipitor 20 Mg Tabs (Atorvastatin calcium) .[name]... Take one tablet by mouth daily.  Problem # 5:  DEPRESSION (ICD-311)  Her updated medication list for this problem includes:    Nortriptyline Hcl 75 Mg Caps (Nortriptyline hcl) .[name]... 1 by mouth once daily    Citalopram Hydrobromide 20 Mg Tabs (Citalopram hydrobromide) .[name]... 1 each am  Complete Medication List: 1)  Amlodipine Besylate 2.5 Mg Tabs (amlodipine Besylate)  .[name].. 1 once daily 2)  Lotensin 40 Mg Tabs (Benazepril hcl) .... 1/2 by mouth once daily, 3)  Metoprolol Tartrate 25 Mg Tabs (Metoprolol tartrate) .[name].. 1 by mouth two times a day 4)  Ipratropium-albuterol 0.5-2.5 (3) Mg/19ml Soln (Ipratropium-albuterol) .... As directed 5)  Proair Hfa 108 (90 Base) Mcg/act Aers (Albuterol sulfate) .[name].. 1-2 puffs as needed 6)  Qvar 80 Mcg/act Aers (Beclomethasone dipropionate) .... 2 puffs every 12 hrs ; gargle & swallow after use 7)  Hydrocodone-acetaminophen 10-325 Mg Tabs (Hydrocodone-acetaminophen) .[name].. 1 by mouth 4-5 x daily 8)  Aspirin 81 Mg Tbec (Aspirin) .... Take one tablet by mouth daily 9)  Nortriptyline Hcl 75 Mg Caps (Nortriptyline hcl) .[name].. 1 by mouth once daily 10)  Lipitor 20 Mg Tabs (Atorvastatin  calcium) .... Take one tablet by mouth daily. 11)  Citalopram Hydrobromide 20 Mg Tabs (Citalopram hydrobromide) .[name].. 1 each am  Other Orders: Flu Vaccine 15yrs + ([address]) Administration Flu vaccine - MCR (U0454)  Patient Instructions: 1)  Assess trial of Citalopram 20 mg once daily for depression.  Fasting labs with Lipids next week: 2)  BMP :401.9; 3)  TSH :780.79; 4)  CBC w/ Diff : 780.79. Flu Vaccine Consent Questions     Do you have a history of severe allergic reactions to this vaccine? no    Any prior history of allergic reactions to egg and/or gelatin? no    Do you have a sensitivity to the preservative Thimersol? no    Do you have a past history of Guillan-Barre Syndrome? no    Do you currently have an acute febrile illness? no    Have you ever had a severe reaction to latex? no    Vaccine information given and explained to patient? yes    Are you currently pregnant? no    Lot Number:[id number]   Exp Date:10/23/2010   Site Given  Left Deltoid IM-CCC] Prescriptions: CITALOPRAM HYDROBROMIDE 20 MG TABS (CITALOPRAM HYDROBROMIDE) 1 each  am  #30 x 5   Entered and Authorized by:   Marga Melnick MD   Signed by:   Marga Melnick MD on 12/24/2009   Method used:   Print then Give to Patient   RxID:   [id number] QVAR 80 MCG/ACT AERS (BECLOMETHASONE DIPROPIONATE) 2 puffs every 12 hrs ; gargle & swallow after use  #1 x 11   Entered and Authorized by:   Marga Melnick MD   Signed by:   Marga Melnick MD on 12/24/2009   Method used:   Print then Give to Patient   RxID:   [id number] PROAIR HFA 108 (90 BASE) MCG/ACT AERS (ALBUTEROL SULFATE) 1-2 puffs as needed  #1 x 2   Entered and Authorized by:   Marga Melnick MD   Signed by:   Marga Melnick MD on 12/24/2009   Method used:   Print then Give to Patient   RxID:   [id number] IPRATROPIUM-ALBUTEROL 0.5-2.5 (3) MG/3ML SOLN (IPRATROPIUM-ALBUTEROL) as directed  #1 box x 11   Entered and Authorized by:   Marga Melnick MD    Signed by:   Marga Melnick MD on 12/24/2009   Method used:   Print then Give to Patient   RxID:   [id number] LOTENSIN 40 MG TABS (BENAZEPRIL HCL) 1/2 by mouth once daily,  #90 x 3   Entered and Authorized by:   Marga Melnick MD   Signed by:   Marga Melnick MD on 12/24/2009   Method used:   Print then Give to Patient   RxID:   [id number] METOPROLOL TARTRATE 25 MG  TABS (METOPROLOL TARTRATE) 1 by mouth two times a day  #180 x 3   Entered and Authorized by:   Marga Melnick MD   Signed by:   Marga Melnick MD on 12/24/2009   Method used:   Print then Give to Patient   RxID:   [id number] AMLODIPINE BESYLATE 2.5 MG TABS (AMLODIPINE BESYLATE) 1 once daily  #90 x 3   Entered and Authorized by:   Marga Melnick MD   Signed by:   Marga Melnick MD on 12/24/2009   Method used:   Print then Give to Patient   RxID:   [id number]    .lbmedflu

## 2010-05-25 NOTE — Letter (Signed)
Summary: lipid reminder   [facility]  [address]   [place], [place] [address]   Phone: [phone]  Fax: [phone]        November 03, 2009 MRN: [id number]    [facility] [place] [address] [place], [place]  [address]    Dear Ms. Sedalia Muta,  Our records indicate it is time to check your cholesterol.  Please call our office to schedule an appt for labwork.  Please remember it is a fasting lab.     Sincerely,  Meredith Staggers, RN Willa Rough, MD This letter has been electronically signed by your physician.

## 2010-05-27 NOTE — Progress Notes (Signed)
Summary: Prior Auth  Phone Note Refill Request Call back at [phone] Message from:  Pharmacy on April 27, 2010 11:47 AM  Refills Requested: Medication #1:  IPRATROPIUM-ALBUTEROL 0.5-2.5 (3) MG/3ML SOLN as directed   Dosage confirmed as above?Dosage Confirmed Prior Auth from [facility] [address] in [place]  Initial call taken by: Harold Barban,  April 27, 2010 11:47 AM  Follow-up for Phone Call        Forms requested. Lucious Groves CMA  April 28, 2010 11:07 AM   form completed, pending signature. Lucious Groves CMA  April 29, 2010 8:47 AM   Are you finished with the patient form? It has not been returned to me. Lucious Groves CMA  May 03, 2010 11:30 AM   Additional Follow-up for Phone Call Additional follow up Details #1::        Forms faxed, awaiting insurance company reply. Lucious Groves CMA  May 03, 2010 3:45 PM      Appended Document: Prior Lelon Perla from [facility] called and stated this prior authorization was denied because it is not covered under Medicare Part D.  He states the pharmacy should resubmit request to Medicare Part B and they will cover it.  He also spoke with the patient and she is aware.  Appended Document: Prior Auth--Update    Phone Note Outgoing Call   Summary of Call: I called patient insurance company (her ID is [phone]) and requested paperwork for part B coverage. I was notified that there is no additional paperwork and the patient was advised that she can go pick up the prescription from [facility]. Initial call taken by: Lucious Groves CMA,  May 04, 2010 9:42 AM

## 2010-05-27 NOTE — Progress Notes (Signed)
Summary: rx preferance--Ipratrop/Albuterol  Phone Note Call from Patient   Summary of Call: In order for part B to cover patient Ipratropium/Alb the pharmacy needs a specific dx code and specific instructions for usage. It has been denied by Medicare part  D. I have spoken with patient pharmacy and they can split the two meds where patient insurance company will cover it and only cost the patient $4 each. Please advise of which method you prefer for the patient. Initial call taken by: Lucious Groves CMA,  May 12, 2010 9:41 AM  Follow-up for Phone Call        this generic  can be used  up to every 4-6 hrs  as needed for her advanced COPD, asthmatic bronchitis. It would not be qid unless during an exacerbation. It this is not sufficient , referral to Pul is needed Follow-up by: Marga Melnick MD,  May 12, 2010 12:50 PM  Additional Follow-up for Phone Call Additional follow up Details #1::        Sent more detailed prescription to see if it will be covered. Lucious Groves CMA  May 12, 2010 2:24 PM     New/Updated Medications: IPRATROPIUM-ALBUTEROL 0.5-2.5 (3) MG/3ML SOLN (IPRATROPIUM-ALBUTEROL) inhale as directed q 4-6 hrs as needed must not be qid unless during an exacerbation Prescriptions: IPRATROPIUM-ALBUTEROL 0.5-2.5 (3) MG/3ML SOLN (IPRATROPIUM-ALBUTEROL) inhale as directed q 4-6 hrs as needed must not be qid unless during an exacerbation  #1 month x 3   Entered by:   Lucious Groves CMA   Authorized by:   Marga Melnick MD   Signed by:   Lucious Groves CMA on 05/12/2010   Method used:   Faxed to ...       [facility] [address]* (retail)       [address]       [place], [place]  [address]       Ph: [phone]       Fax: [phone]   RxID:   [id number] IPRATROPIUM-ALBUTEROL 0.5-2.5 (3) MG/3ML SOLN (IPRATROPIUM-ALBUTEROL) inhale as directed q 4-6 hrs as needed must not be qid unless during an exacerbation  #1 month x 3   Entered by:   Lucious Groves CMA   Authorized by:   Marga Melnick MD   Signed by:   Lucious Groves CMA on 05/12/2010   Method used:   Electronically to        [facility] [id number]* (retail)       [address]       [place], [place]  [address]       Ph: [phone]       Fax: [phone]   RxID:   [id number]

## 2010-06-01 ENCOUNTER — Ambulatory Visit: Payer: Medicare Other | Attending: Physical Medicine & Rehabilitation

## 2010-06-01 ENCOUNTER — Ambulatory Visit (HOSPITAL_BASED_OUTPATIENT_CLINIC_OR_DEPARTMENT_OTHER): Payer: Medicare Other | Admitting: Physical Medicine & Rehabilitation

## 2010-06-01 DIAGNOSIS — M169 Osteoarthritis of hip, unspecified: Secondary | ICD-10-CM | POA: Insufficient documentation

## 2010-06-01 DIAGNOSIS — M4120 Other idiopathic scoliosis, site unspecified: Secondary | ICD-10-CM | POA: Insufficient documentation

## 2010-06-01 DIAGNOSIS — R5381 Other malaise: Secondary | ICD-10-CM | POA: Insufficient documentation

## 2010-06-01 DIAGNOSIS — Z79899 Other long term (current) drug therapy: Secondary | ICD-10-CM | POA: Insufficient documentation

## 2010-06-01 DIAGNOSIS — M1610 Unilateral primary osteoarthritis, unspecified hip: Secondary | ICD-10-CM

## 2010-06-01 DIAGNOSIS — F3289 Other specified depressive episodes: Secondary | ICD-10-CM | POA: Insufficient documentation

## 2010-06-01 DIAGNOSIS — M961 Postlaminectomy syndrome, not elsewhere classified: Secondary | ICD-10-CM | POA: Insufficient documentation

## 2010-06-01 DIAGNOSIS — F411 Generalized anxiety disorder: Secondary | ICD-10-CM | POA: Insufficient documentation

## 2010-06-01 DIAGNOSIS — R209 Unspecified disturbances of skin sensation: Secondary | ICD-10-CM | POA: Insufficient documentation

## 2010-06-01 DIAGNOSIS — F329 Major depressive disorder, single episode, unspecified: Secondary | ICD-10-CM | POA: Insufficient documentation

## 2010-06-02 NOTE — Medication Information (Signed)
Summary: Prior Authorization for Ipratropium Albuterol  Prior Authorization for Ipratropium Albuterol   Imported By: Lanelle Bal 05/24/2010 11:36:49  _____________________________________________________________________  External Attachment:    Type:   Image     Comment:   External Document

## 2010-08-31 ENCOUNTER — Encounter: Payer: Medicare Other | Admitting: Neurosurgery

## 2010-08-31 ENCOUNTER — Ambulatory Visit: Payer: Medicare Other | Admitting: Physical Medicine & Rehabilitation

## 2010-09-07 NOTE — Assessment & Plan Note (Signed)
A 60 year old female, I last saw her in October 23, 2008.  She had a left SI  injection which was not particularly helpful, had some minimal effect.  She restarted her Naprosyn not much in terms of pain relief from that.  She has had good relief with hydrocodone in the past.  I reviewed her  urine drug screen performed October 23, 2008, which was consistent.  He has  been on hydrocodone in the past 5 mg dos,  this was not quite enough for  her.  She continues to have back pain and hip pain.   Examination, she has a antalgic gait favoring the right hip.  She has  mildly decreased right hip internal and external rotation.   Pain level is 5/10.   PHYSICAL EXAMINATION:  VITAL SIGNS:  Blood pressure 126/50, pulse 72,  respirations 80, O2 sat 94% on room air.  GENERAL:  No acute distress.  Mood and affect appropriate.  EXTREMITIES:  Gait is with a cane.  She has some valgus deformity at the  right knee.  Lower extremity strength is normal.  She has mildly reduced  internal and external rotation bilateral hips.   IMPRESSION:  1. Lumbar post laminectomy syndrome, chronic postoperative pain.  We      will start some hydrocodone 7.5/325 t.i.d.  2. Hip pain with antalgic gait.  We will check PA pelvis as well as 2-      views of right hip.   I will see her back in followup, if any significant hip OA, I will refer  to ortho.      Erick Colace, M.D.  Electronically Signed     AEK/MedQ  D:  10/31/2008 15:46:02  T:  11/01/2008 04:39:10  Job #:  [id number]

## 2010-09-07 NOTE — Consult Note (Signed)
CONSULT REQUESTED:  Mark C. Ophelia Charter, MD.   [name] is a 60 year old female who has a history of back pain, dating  from the 1990s, however, has increased in the last 2 years.  She  developed right lower extremity weakness last year, was seen initially  by Dr. Ophelia Charter who referred her to [facility] to be  evaluated by Dr. Thereasa Solo.  She has CT myelogram indicating  severe stenosis at L4-L5.  Because of the significant thoracolumbar  scoliosis, she was elected to decompress at L4-L5 and fuse that  particular level.  She had no postoperative complications, and her right  lower extremity pain and numbness got better.  She continues with back  pain that goes into the buttocks.  She also has had some pain that goes  into the right hip.   She has been trialed on oxycodone postoperatively and last prescription  on Aug 28, 2007, for #90, she still has 43 left.  She really does not use  this.  She has tried some hydrocodone 5/500, #90 prescribed on October 16, 2007, and still has 19 left of these, this helps a little.   She no longer sees Dr. Senaida Ores.  She does see Dr. Myrtis Ser from  Cardiology.  She has a history of MI, but has not undergone stenting.  She has had nuclear medicine scan, showed no evidence of wall motion  abnormalities.  Her last Oswestry scale was 22/50.   Her pain average is 7/10, described as sharp, burning, and aching.  Interferes with activity at 9/10 level.  Worse during the daytime as  well as with walking and standing, and improves with sitting.  Relief  from meds is just a little 3/10.  She walks without assistance,  sometimes uses the cane.  She does drive.  She was last employed on  March 21, 2007.  She does need assistance with household duties.  She  has problems with sleep, trouble walking, and depression in terms of her  review of systems as well as wheezing, shortness of breath.  She sees  Dr. __________ for her asthma.   PAST SURGICAL  HISTORY:  Significant for hysterectomy, ankle surgery in  2001, and cataract in 2001 and 2002.   SOCIAL HISTORY:  Married, lives with her husband.  No longer smokes.  She does not drink alcohol.   FAMILY HISTORY:  Heart disease and high blood pressure.   OTHER TREATMENTS TRIALED:  She has not tried any physical therapy.   Her blood pressure is 132/66, pulse 65, respiratory rate is 18, and O2  sat is 96% on room air.   In general, in no acute distress.  Mood and affect appropriate.  She  does have obvious kyphosis in the thoracic area.  She has S-shaped  spine.  She has full strength bilateral deltoid, biceps, triceps, grip,  as well as hip flexion, knee extension, and ankle dorsiflexion.  She has  some reduction of right hip internal and external rotation on range of  motion, normal on the left side.  She has tenderness to palpation in the  lumbosacral junction, as well as over the PSIS, right greater than left  side.   She has essentially no extension of her lumbar spine, but she has good  flexion.  Her gait is mildly unsteady.  She full time uses a cane.  No  evidence of toe drag or knee instability.  Her ankle and knee range of  motions are normal.  Sensation is normal.   IMPRESSION:  Lumbar postlaminectomy syndrome with chronic postoperative  pain.  Her main pain is axial rather than radicular, the surgery did  help with her radicular symptoms.  We believe her most likely pain  generator would be her facets in either at L5-S1 or above the level of  the fusion.  Also, in the differential is sacroiliac pain.  Certainly,  she may have some multifactorial pain as well.   RECOMMENDATIONS:  1. We will try to get a copy of her most recent CT myelogram and MRI.  2. We will set her up for facet injections, diagnostic/therapeutic.  3. Give Valium 10 mg p.o. prior to injection.  4. Trial physical therapy plus/minus aquatic exercise program.   Thank you for this interesting  consultation.  We will keep you apprised  of her progress.      Erick Colace, M.D.  Electronically Signed     AEK/MedQ  D:12/11/2007 15:11:58  T:12/12/2007 08:13:51  Job #:  [id number]   cc:   Loraine Leriche C. Ophelia Charter, M.D.  Fax: [phone]

## 2010-09-07 NOTE — Procedures (Signed)
NAME:  Hannah Scott, Hannah Scott                   ACCOUNT NO.:  [id number]   MEDICAL RECORD NO.:  [id number]         PATIENT TYPE:  aecp   LOCATION:                                 FACILITY:   PHYSICIAN:  Erick Colace, M.D.DATE OF BIRTH:  [birth year]   DATE OF PROCEDURE:  DATE OF DISCHARGE:                               OPERATIVE REPORT   PROCEDURE:  Bilateral L5-S1 intra-articular lumbar facet injection.   INDICATIONS:  Lumbar axial pain with spondylosis, history of L4-5  instrumented fusion, Oswestry score of 42%.  The patient at least 50%  responsive to lumbar facet injections performed 1 month ago.   Duration of benefit 2 weeks.   Informed consent was obtained after describing the risks and benefits of  the procedure with the patient.  These include bleeding, bruising,  infection.  She elects to proceed and has given written consent.  The  patient placed prone on fluoroscopy table.  Betadine prep, sterile drape  25-gauge inch and half needle was used to anesthetize the skin and subcu  tissue with 1% lidocaine x2 mL at 2 sites and a 22-gauge 3-1/2-inch  spinal needle was inserted under fluoroscopic guidance targeting  inferior and medial recess at L5-S1 zygapophyseal joint.  Posterior  approach bone contact made, confirmed with lateral imaging.  Omnipaque  180 showed no intravascular uptake followed by injection of 0.5 mL and  2% lidocaine, 0.5 mL of 40 mg/mL of Depo-Medrol.  Same procedure was  repeated on the opposite side with same needle injected and technique.  The patient tolerated the procedure well.  Pre and post injection vitals  stable.  Post-injection instructions given.  Follow up in 1 month.  We  will initiate physical therapy.  Pre-injection 5/10, and post-injection  3/10.      Erick Colace, M.D.  Electronically Signed     AEK/MEDQ  D:  03/13/2008 09:06:58  T:  03/13/2008 10:10:28  Job:  [id number]   cc:   Loraine Leriche C. Ophelia Charter, M.D.  Fax: [phone]

## 2010-09-07 NOTE — Letter (Signed)
April 01, 2007    [facility]  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   She is presently on Pulmicort two puffs daily, Singulair 10 mg daily,  Foradil inhalations twice a day, albuterol sulfate and ipratropium in a  nebulizer every 4-6 hours as needed for reactive airways disease or  chronic asthmatic bronchitis.   She is also on amlodipine 2.5 mg daily, metoprolol 25 mg b.i.d., and  benazepril 20 mg daily for hypertension.  For hypothyroidism she takes  Levothyroxine 25 mcg daily and is on Lipitor 20 mg daily for  dyslipidemia.   The Pulmicort, Singulair, and Foradil are not available in generic form  & are necessary for control of her asthmatic condition.  The benazepril,  levothyroxine, metoprolol are available as generics through [facility] or  [facility]  for $10 for 90 day supply.  Additionally Lipitor could be  changed to Pravastatin 40 mg which is  also on that formulary.   Additionally, she can contact the [facility] pharmacy support program through  [facility] Department concerning the non generic meds.   A copy of this letter will be provided to Ms. Dvorsky to be sent to the  [facility]. These options can be discussed with her at her  next office visit.    Sincerely,      Titus Dubin. Alwyn Ren, MD,FACP,FCCP  Electronically Signed    WFH/MedQ  DD: 04/01/2007  DT: 04/01/2007  Job #: [id number]

## 2010-09-07 NOTE — Letter (Signed)
March 19, 2007    [facility]  [address] [address]  [place], [place]  [address]   RE:  Hannah Scott, Hannah Scott  MRN:  [id number]  /  DOB:  16-Feb-1951   To whom it may concern:   I follow Alane Mccaskill for her cardiac disease.  She has a history of  several medical problems.   Problems include:  1. History of breast cancer.  2. History of hypertension.  3. History of hypothyroidism.  4. Lipid abnormalities.  5. Significant kyphoscoliosis.  6. Coronary artery disease.  7. Ongoing shortness of breath.   The patient has good left ventricular function.  Currently, she does not  have any ischemia on a Myoview scan.   The medications she requires include Foradil Aerolizer, Singulair, and  Pulmicort for her pulmonary disease.  She needs Lotensin, metoprolol,  Norvasc, Lipitor, and an aspirin for her coronary disease and  hypertension.  She requires aspirin for her coronary artery disease.   The purpose of this letter is to list the diagnoses for this patient.  I  saw her last in the office on February 15, 2007.   This information will be sent to [facility] at their request.    Sincerely,      Luis Abed, MD, [facility]  Electronically Signed    JDK/MedQ  DD: 03/19/2007  DT: 03/19/2007  Job #: [id number]

## 2010-09-07 NOTE — Procedures (Signed)
NAME:  Hannah Scott, Hannah Scott                   ACCOUNT NO.:  [id number]   MEDICAL RECORD NO.:  [id number]          PATIENT TYPE:  REC   LOCATION:  [facility]                          FACILITY:  [facility]   PHYSICIAN:  Erick Colace, M.D.DATE OF BIRTH:  [birth year]   DATE OF PROCEDURE:  01/14/2008  DATE OF DISCHARGE:                               OPERATIVE REPORT   PROCEDURE:  Bilateral sacroiliac injection under fluoroscopic guidance.   INDICATIONS:  Sacroiliac disorder following lumbar fusion.   Pain is only partially responsive to medication management and other  conservative care interferes with activity and self-care Oswestry score  42%.   Informed consent was obtained after describing risks and benefits of the  procedure with the patient.  These include bleeding, bruising, and  infection.  She elects proceed and has given written consent.  The  patient was placed prone on the fluoroscopy table.  Betadine prep,  sterile drape.  A 25-gauge 1-1/2-inch needle was used to anesthetize  skin and subcu tissue, 1% lidocaine x2 mL.  Then, a 25-gauge 3-1/2-inch  spinal needle was inserted under fluoroscopic guidance, first starting  at the left SI joint.  AP, lateral, and oblique imaging utilized.  Omnipaque 180 mg done under live fluoro demonstrated good joint outline,  no intravascular uptake followed by injection of 1 mL of 2% MPF  lidocaine and 0.5 mL of 40 mg/mL Depo-Medrol.  Same procedure repeated  on the right side using same needle, technique, and injectate.  The  patient tolerated the procedure well.  Post injection instructions were  given.       Erick Colace, M.D.  Electronically Signed     AEK/MEDQ  D:  01/14/2008 09:14:07  T:  01/14/2008 22:41:57  Job:  [id number]

## 2010-09-07 NOTE — Assessment & Plan Note (Signed)
Hannah Scott follows up today.  I have not seen her since March 13, 2008.  At that time she underwent bilateral L5-S1 intra-articular facet  injections which was not particularly helpful.  She had a previous that  was helpful.  She did quite well with sacroiliac injections last fall,  had several weeks' duration of effect.  She does not recall exactly how  long.   She has had exacerbation of her pain, still limited left-sided low back  area.  Average pain is 8, currently 6.  She has tried some tramadol in  the past, really did not help a whole lot, really has not tried  nonsteroidals.  She also has had a few of her hydrocodone that she used  sparingly, but once again these have not been particularly helpful.   Sleep is fair.  Pain is worse with walking and standing.  Relief from  meds is a little.  She can climb steps.  She can drive.  She is not  employed since November 2008.  Pain with household duties, trouble  walking on review of systems.   She has some shortness of breath and wheezing.  She still does her  housework as much she can, but she can no longer vacuum.   PAST MEDICAL HISTORY:  1. Heart disease.  2. High blood pressure.  3. Thyroid disease.  4. Arthritis.   SOCIAL HISTORY:  Married, lives with her husband, on 100% of [facility].   PHYSICAL EXAMINATION:  VITAL SIGNS:  Blood pressure 149/87, pulse 98,  respirations 16, and O2 sat 94% on room air.  MUSCULOSKELETAL:  She has pain over left PSIS.  She has marked  kyphoscoliosis which is unchanged.  Lower extremity strength normal.  Straight leg raising test negative.  Deep tendon reflex 1+ bilateral  lower extremity.  Hip range of motion mildly reduced bilaterally.  She  has no lumbar extension.  She has good lumbar flexion.  Gait is mildly  unsteady, uses cane at times.  No knee instability or foot drag.  Sensation is intact.   IMPRESSION:  1. Lumbar post-laminectomy syndrome, chronic postoperative pain     appears to be more sacroiliac related.  We will repeat the left SI      injection.  2. We will have her take some Naprosyn 500 b.i.d.  We will check urine      drug screen and we could institute a higher dose hydrocodone      7.5/325 if the Naprosyn not particularly helpful.  Due to insurance      reasons, would like to have her injection prior to November 01, 2008.      Hannah Scott, M.D.  Electronically Signed     AEK/MedQ  D:  10/14/2008 10:28:43  T:  10/15/2008 00:48:51  Job #:  [id number]   cc:   Loraine Leriche C. Ophelia Charter, M.D.  Fax: [phone]

## 2010-09-07 NOTE — Assessment & Plan Note (Signed)
HEALTHCARE                            CARDIOLOGY OFFICE NOTE   NAME:Hannah Scott, Hannah Scott                          MRN:          [id number]  DATE:02/08/2007                            DOB:          1950/08/11    Hannah Scott is here for cardiology followup.  She had been followed by Dr.  Cecil Cranker in the past and I took over her cardiology care with  my note of November 22, 2006.  At that time, we cleared her for back surgery  at [facility] which has been done.  She says that the numbness in her leg is  much better.  She still has some back pain.  The surgery has allowed her  to have increased activity and with this she has noticed limiting  shortness of breath.  She filled out a form today about the possible  chest pain.  In the areas where she marked that she was having pain, she  actually meant that she was having shortness of breath.  She has not had  chest pain.  She is as I mentioned going about regular activity level.   The patient does have known coronary disease.  Her last studies were  done in 2007.   PAST MEDICAL HISTORY:   ALLERGIES:  No known drug allergies.   MEDICATIONS:  Foradil Aerolizer, Lotensin, metoprolol, Singulair,  Pulmicort, Norvasc, Lipitor, Synthroid, and aspirin.   OTHER MEDICAL PROBLEMS:  See the list below.   REVIEW OF SYSTEMS:  See the HPI above.  Otherwise here review of systems  is negative.   PHYSICAL EXAMINATION:  VITAL SIGNS:  Weight is 192 pounds.  This is down  4 pounds since July 2008.  Blood pressure is 150/92 with a pulse of 68.  NEUROLOGIC:  The patient is oriented to person, time, and place.  Affect  is normal.  HEENT:  Reveals no xanthelasma. She has normal extraocular motion.  NECK:  There are no carotid bruits.  There is no jugular venous  distention.  GENERAL:  The patient is significantly overweight.  LUNGS:  Clear.  Respiratory effort is not labored.  CARDIAC:  Reveals an S1 with an S2.  There are no clicks  or significant  murmurs.  ABDOMEN:  Obese but soft.  She has normal bowel sounds.  EXTREMITIES:  There is no significant peripheral edema.  She does have  significant kyphoscoliosis.   EKG shows no significant change.   PROBLEM LIST:  1. History of breast cancer, treated in the past.  2. History of hypertension.  Her pressure is borderline high today.  I      have not changed her medications.  3. History of hypothyroidism.  The patient had been off her Synthroid      for 3 months.  Dr. Alwyn Ren is following this along.  She is on low      dose replacement now.  4. Lipid abnormalities.  In July 2008, her LDL was 90, triglycerides      65, HDL 46.  This is on Lipitor 20.  She will be  reminded about her      diet and encouraged and over time we will consider pushing her      Lipitor higher to get her LDL closer to recommended level of 70.  5. Significant kyphoscoliosis.  She has had a surgical procedure by      Dr. Senaida Ores at [facility] and she is improving.  6. History of coronary disease.  There is a question of an myocardial      infarction in 1991 and 1998.  There is a 90% distal left anterior      descending artery lesion documented in the past.  Myoview done in      May 2007, revealed an ejection fraction of 70% and no ischemia.      She tolerated her back surgery well.  7. ONGOING SHORTNESS OF BREATH.  This has started since the patient      had the higher activity level after her back surgery.  There is no      chest pain.  We do need to reassess her cardiac status fully at      this time.  She will need a chest x-ray.  A 2D echocardiogram will      be done to be sure there has no significant change in her left      ventricle or any valvular changes.  She will have a dobutamine      Myoview.  She has had this type of study before to help assess her      cardiac status.  She does not known pulmonary disease and she is on      pulmonary medications.  Followup with Dr. Alwyn Ren will also  be      helpful in this regard.  I will see her to follow up the cardiac      evaluation.     Luis Abed, MD, [facility]  Electronically Signed    JDK/MedQ  DD: 02/08/2007  DT: 02/08/2007  Job #: [id number]   cc:   Titus Dubin. Alwyn Ren, MD,FACP,FCCP  Mark C. Ophelia Charter, M.D.

## 2010-09-07 NOTE — Procedures (Signed)
NAME:  Hannah Scott, Hannah Scott                   ACCOUNT NO.:  [id number]   MEDICAL RECORD NO.:  [id number]         PATIENT TYPE:  aecp   LOCATION:                                 FACILITY:   PHYSICIAN:  Andrew E. Kirsteins, M.D.DATE OF BIRTH:  10/23/1950   DATE OF PROCEDURE:  DATE OF DISCHARGE:                               OPERATIVE REPORT   PROCEDURE:  Bilateral L5-S1 intra-articular lumbar facet injection.   INDICATIONS:  Lumbar axial pain with spondylosis, history of L4-5  instrumented fusion, Oswestry score of 42%.  The patient at least 50%  responsive to lumbar facet injections performed 1 month ago.   Duration of benefit 2 weeks.   Informed consent was obtained after describing the risks and benefits of  the procedure with the patient.  These include bleeding, bruising,  infection.  She elects to proceed and has given written consent.  The  patient placed prone on fluoroscopy table.  Betadine prep, sterile drape  25-gauge inch and half needle was used to anesthetize the skin and subcu  tissue with 1% lidocaine x2 mL at 2 sites and a 22-gauge 3-1/2-inch  spinal needle was inserted under fluoroscopic guidance targeting  inferior and medial recess at L5-S1 zygapophyseal joint.  Posterior  approach bone contact made, confirmed with lateral imaging.  Omnipaque  180 showed no intravascular uptake followed by injection of 0.5 mL and  2% lidocaine, 0.5 mL of 40 mg/mL of Depo-Medrol.  Same procedure was  repeated on the opposite side with same needle injected and technique.  The patient tolerated the procedure well.  Pre and post injection vitals  stable.  Post-injection instructions given.  Follow up in 1 month.  We  will initiate physical therapy.  Pre-injection 5/10, and post-injection  3/10.      Andrew E. Kirsteins, M.D.  Electronically Signed     AEK/MEDQ  D:  03/13/2008 09:06:58  T:  03/13/2008 10:10:28  Job:  [id number]   cc:   Mark C. Yates, M.D.  Fax: [phone] 

## 2010-09-07 NOTE — Assessment & Plan Note (Signed)
[facility]                            CARDIOLOGY OFFICE NOTE   NAME:Scott, Hannah PORT                          MRN:          [id number]  DATE:02/15/2007                            DOB:          [birth year]    Miss Hannah Scott is here for followup. She continues to have exertional  shortness of breath. She is not having any significant chest pain. When  I saw her on February 08, 2007, we decided to proceed with a 2D echo and  the plan was to proceed with a dobutamine Myoview and a 2D echo and  potentially chest x-ray. The 2D echo was done on 02/13/07. She has good  LV function with an ejection fraction of 55-60%. There were no regional  wall motion abnormalities. Aortic root was of upper normal limits size.  There was an atrial septal aneurysm which was of no clinical  significance at this time. The patient also had a dobutamine Myoview.  This was done on February 13, 2007. She received dobutamine. She had a  normal contractility. There was no sign of scar or ischemia. There was  no definite sign of ischemia. At this point, it is my understanding that  a chest x-ray has not been done. Today, the patient does mention that  she still has exertional shortness of breath.   PAST MEDICAL HISTORY:   ALLERGIES:  No known drug allergies.   CURRENT MEDICATIONS:  1. Foradil Aerolizer.  2. Lotensin.  3. Metoprolol.  4. Singulair.  5. Pulmicort.  6. Norvasc.  7. Lipitor.  8. Synthroid.  9. Aspirin.   OTHER MEDICAL PROBLEMS:  See the list on my note of February 08, 2007.   REVIEW OF SYSTEMS:  Other than her shortness of breath, she is doing  relatively well. Unfortunately, she is losing her insurance and her  medication costs are difficult and we talked about generics. She is on  mostly generics at this time. Otherwise, review of systems is negative.   PHYSICAL EXAMINATION:  Blood pressure 134/74, pulse 73. The patient is  oriented to person, time and place and her  affect is normal.   We had a complete discussion about her situation. She is stable. She  does not need any further cardiac workup. I talked with her about  whether we should do pulmonary function studies or other pulmonary  evaluation. Dr.  Alwyn Scott is an experienced pulmonary physician and she  has followed with him for years. She will see Dr.  Alwyn Scott back for the  next step of her workup.   Note, we do not have a recent chest x-ray.     Hannah Abed, MD, [facility]  Electronically Signed    JDK/MedQ  DD: 02/15/2007  DT: 02/15/2007  Job #: [id number]   cc:   Titus Dubin. Hannah Ren, MD,FACP,FCCP  Mark C. Ophelia Charter, M.D.

## 2010-09-07 NOTE — Assessment & Plan Note (Signed)
A 60 year old female who I last saw on October 19, 2008.  She has a history  of postlaminectomy syndrome.  She is here for sacroiliac injection  today.  She has had prior injection which was helpful January 13, 2009, she went from 6-0.  Has not had a SI injection since that time.  Had facet injection which was not very helpful.  She had facet injection  that was only moderately helpful, went from 5-3.   She is not on any blood thinners, anti-inflammatories, or antibiotics.   She has a driver today.   Informed consent was obtained after describing risks and benefits of the  procedure with the patient.  These include bleeding bruising, infection.  She elects to proceed and has given written consent.  The patient placed  prone on fluoroscopy table.  Betadine prep, sterile drape.  A 25-gauge  inch and a half needle was used to anesthetize the skin and subcu  tissue, 1% lidocaine x2 mL.  Then, a 22-guage 3-1/2 inch spinal needle  was inserted under fluoroscopic guidance targeting the left SI joint.  AP, lateral, and oblique images utilized.  Omnipaque 180 under live  fluoro demonstrated no intravascular uptake.  Then, 0.5 mL of 40 mg/mL  Depo-Medrol injected with 1 mL of 2% lidocaine.  The patient tolerated  the procedure well.  Pre and postinjection vitals stable.  Postinjection  instructions given.  I reviewed her urine drug screen.  I felt her to be  appropriate for narcotic analgesic medications; however, she did not  fill the Naprosyn because of her restrictions for upcoming injection,  and therefore we will trial that first and failing this may trial her on  some hydrocodone.      Erick Colace, M.D.  Electronically Signed     AEK/MedQ  D:  10/23/2008 15:28:23  T:  10/24/2008 06:17:50  Job #:  [id number]

## 2010-09-07 NOTE — Assessment & Plan Note (Signed)
[facility]                            CARDIOLOGY OFFICE NOTE   NAME:Hannah Scott, Hannah Scott                          MRN:          [id number]  DATE:11/22/2006                            DOB:          Feb 24, 1951    Hannah Scott is seen today for several reasons.  Her primary cardiologist has  been Dr. Glennon Hamilton, who recently retired and I am seeing her to take  over her care.  In addition she needs clearance to have surgery at [facility]  for her significant kyphoscoliosis that is causing significant symptoms  for her.  She does have known coronary disease.  She has been stable  over the years.  Her last cath was actually done in 1998, showing a 90%  distal LAD lesion.  She had a Myoview scan done on Sep 07, 2005 with  dobutamine stress.  She had an ejection fraction of 72%.  There was no  sign of ischemia.  She has been stable and there is no need to repeat  her exercise testing.  She has not had any significant chest pain.  She  has not had syncope or presyncope.   It is of importance that there was some variation in the approach to  taking her Synthroid.  I do not have all of the details, but the patient  tells me today that she has now been off Synthroid on her own for  approximately three months.  It is very important to recheck her TSH  today and this will be done with a copy sent to Dr. Alwyn Ren and the  patient will check with Dr. Alwyn Ren to be sure that they are in agreement  in the approach.   PAST MEDICAL HISTORY:   ALLERGIES:  No known drug allergies.   MEDICATIONS:  1. Foradil Aerolizer.  2. Lotensin 20.  3. Metoprolol 25 b.i.d.  4. Singulair 10.  5. Pulmicort 200 b.i.d.  6. Norvasc 2.5.  7. Lipitor 20.  Her Lipitor dose was changed recently and it is now      time for a follow-up fasting lipid profile.   OTHER MEDICAL PROBLEMS:  See the complete list below.   REVIEW OF SYSTEMS:  Overall she is doing well.  She has difficulty  exercising because of  her back discomfort.  She also has some mild  shortness of breath.  There is no PND or orthopnea.  She has no signs of  overt congestive heart failure.  Otherwise her review of systems is  negative.   PHYSICAL EXAMINATION:  Her weight is 196 pounds which is stable for her.  Blood pressure 138/82 with pulse of 83.  The patient is oriented to person, time, and place.  Affect is normal.  HEENT:  Reveals no xanthelasma.  She has normal extraocular motion.  There are no carotid bruits.  There is no jugular venous distention.  LUNGS:  Clear.  Respiratory effort is not labored.  CARDIAC:  Reveals an S1 with an S2.  There are no clicks or significant  murmurs.  ABDOMEN:  Obese.  She is  overweight in general.  She has normal bowel  sounds.  There is no significant peripheral edema.   There is no need for an EKG today.  In April of 2008 her EKG showed some  mild nonspecific ST/T-wave abnormalities.   In May of 2008 her hemoglobin was 13.2 with a BUN of 14 and creatinine  0.8.  Her LDL at that time had been 91.   PROBLEMS:  1. History of breast cancer treated in the past.  2. History of hypertension, treated.  3. History of hypothyroidism.  As outlined above the patient has been      off Synthroid for three months.  It is very important that this be      rechecked and this will be done today.  Results will go to Dr.      Alwyn Ren and she will be in touch with Dr. Alwyn Ren to be sure that      they are in agreement about the approach of her thyroid.  4. Lipid abnormalities.  She will  have a fasting lipid profile today      to see the effect of her higher dose of Lipitor.  5. Significant scoliosis.  From my viewpoint she is cleared for      surgery by Dr. Senaida Ores at [facility].  No further cardiac workup is      needed.  As outlined her thyroid status needs to be clarified.  6. History of coronary disease with question of a myocardial      infarction in 1991 and 1998.  There is a 90% distal LAD.  We  know      that her Cardiolite in 2007 was stable and no further testing is      needed.  She is cleared for this procedure.   From a cardiology viewpoint she can be seen back this time in six months  for follow-up.     Luis Abed, MD, [facility]  Electronically Signed    JDK/MedQ  DD: 11/22/2006  DT: 11/22/2006  Job #: [id number]   cc:   Titus Dubin. Alwyn Ren, MD,FACP,FCCP  Vincent Peyer C. Ophelia Charter, M.D.

## 2010-09-07 NOTE — Procedures (Signed)
NAME:  Hannah Scott, Hannah Scott                   ACCOUNT NO.:  [id number]   MEDICAL RECORD NO.:  [id number]         PATIENT TYPE:  AECP   LOCATION:                                 FACILITY:   PHYSICIAN:  Erick Colace, M.D.DATE OF BIRTH:  [birth year]   DATE OF PROCEDURE:  DATE OF DISCHARGE:                               OPERATIVE REPORT   PROCEDURE:  Bilateral L5-S1 intra-articular lumbar facet injection.   INDICATIONS:  Lumbar axial pain with spondylosis, history of L4-5  instrumented fusion.  Pain is less than 50% responsive to sacroiliac  injections performed 1 month ago.  Pain interferes with self-care and  mobility.  Oswestry index score 52%.   Informed consent was obtained after describing risks and benefits of  procedure with the patient.  These include bleeding, bruising,  infection.  She elects to proceed and has given written consent.  The  patient placed prone on fluoroscopy table.  Betadine prep, sterile  drape.  A 25-gauge 1-1/2 needle was used to anesthetize the skin and  subcu tissue, 1% lidocaine x2 mL at each of two sites.  Then, a 22-gauge  3-1/2-inch spinal needle was inserted, and under fluoroscopic guidance  targeting the inferior and medial recess, posterior and anterior  approach, bone contact made, confirmed with lateral imaging.  Omnipaque  180 showed good joint outline.  No intravascular uptake followed by  injection of 0.5 mL of 2% MPF lidocaine plus 0.5 mL of 40 mg/mL Depo-  Medrol.  The same procedure was repeated on left side with the same  needle procedure and technique.  The patient tolerated the procedure  well.  Post injection instructions given.  Pre and post injection vitals  stable.  Pre injection pain level 4, post injection 2.  We will have her  come back in 1 month for possible reinjection at that time.      Erick Colace, M.D.  Electronically Signed     AEK/MEDQ  D:  02/11/2008 08:54:14  T:  02/11/2008 22:41:40  Job:  [id number]   cc:    Loraine Leriche C. Ophelia Charter, M.D.  Fax: [phone]

## 2010-09-07 NOTE — Assessment & Plan Note (Signed)
[facility]                            CARDIOLOGY OFFICE NOTE   NAME:Hannah Scott, Hannah Scott                          MRN:          [id number]  DATE:01/23/2008                            DOB:          1951-02-15    The patient is here to follow up her history of coronary artery disease  and shortness of breath.  We know that she has coronary artery disease.  There was a question of an MI in 47 and 1998.  She had a 90% distal  LAD lesion documented in the past.  Myoview in May 2007 revealed an  ejection fraction of 70% and no ischemia.  She had a dobutamine Myoview  also in October 2008 and there was no sign of ischemia.  She has normal  LV function.  She is not having any significant chest pain.  She is not  having any significant palpitations.  There has been no syncope or  presyncope.  She has no PND or orthopnea.  She does have some ongoing  shortness of breath.  We have discussed this before and we think it is  not cardiac.  This is stable.  Her exercise tolerance is limited because  of her back pain.  She has other significant medical problems that  include hypothyroidism.  Her TSH had been mildly elevated in the past  and she needs a repeat TSH.  She also has a history of hypertension.  She is on medications for this.  She has a history of lipid  abnormalities and she will need followup lipid studies.   PAST MEDICAL HISTORY:   ALLERGIES:  No known drug allergies.   MEDICATIONS:  1. Foradil Aerolizer.  2. Lotensin 20.  3. Metoprolol 25 b.i.d.  4. Singulair.  5. Pulmicort.  6. Lipitor 20.  7. Synthroid 25 mcg.  8. Aspirin 81 mg.   OTHER PAST MEDICAL HISTORY:  Significant kyphoscoliosis.  There is also  a history of breast cancer that was treated in the past.   REVIEW OF SYSTEMS:  Other than the HPI, the patient is not having any  significant GI symptoms.  She is not having any GU symptoms.  She has no  major skin rashes and no significant  headaches.  Her review of systems,  otherwise, is negative.   PHYSICAL EXAMINATION:  VITAL SIGNS:  Blood pressure is 134/86 with a  pulse of 60.  The patient's weight is 183 pounds.  She has lost 8 pounds  since the visit last year in October 2008.  GENERAL:  The patient is oriented to person, time, and place.  Affect is  normal.  HEENT:  No xanthelasma.  She has normal extraocular motion.  NECK:  There are no carotid bruits.  There is no jugular venous  distention.  LUNGS:  Clear.  Respiratory effort is not labored.  CARDIAC:  S1 with an S2.  There are no clicks or significant murmurs.  ABDOMEN:  Soft.  EXTREMITIES:  She has no significant peripheral edema.  She does have  kyphoscoliosis.  She also is significantly overweight.  LABS STUDIES:  Her EKG today shows normal sinus rhythm and no  significant changes.   PROBLEMS:  1. History of breast cancer treated in the past.  2. History of hypertension.  Her blood pressure is good today.  We do      not need to adjust her medicines at all.  3. History of hypothyroidism.  Her last TSH that I have documented      revealed that her TSH was slightly above 5.  She is on 25 mcg of      Synthroid.  She needs a TSH today to reassess this.  4. History of lipid abnormalities.  The patient is on Lipitor.  She      has not eaten this morning and we will obtain a fasting lipid      profile today.  5. History of significant kyphoscoliosis.  6. History of coronary artery disease.  I have reviewed all of her      data.  She is stable.  She is not having any significant symptoms.      We do not need to proceed with any testing today.  7. Shortness of breath.  I had worked this up previously.  Her      shortness of breath continues, but is unchanged.  I have chosen not      to order any further studies regarding this at this time.  8. Significant back pain.  She is working through this with her other      doctors.  9. Obesity.  It is good that she  has lost weight and I encouraged her      to continue to lose more.   Studies will be obtained as outlined above.  We will be in touch with  her with the results.  I will plan to see her in 1 year for followup.     Luis Abed, MD, [facility]  Electronically Signed    JDK/MedQ  DD: 01/23/2008  DT: 01/23/2008  Job #: [id number]   cc:   Titus Dubin. Alwyn Ren, MD,FACP,FCCP

## 2010-09-10 ENCOUNTER — Encounter: Payer: Self-pay | Admitting: *Deleted

## 2010-09-10 ENCOUNTER — Ambulatory Visit (INDEPENDENT_AMBULATORY_CARE_PROVIDER_SITE_OTHER): Payer: Medicare Other | Admitting: Internal Medicine

## 2010-09-10 VITALS — BP 132/90 | HR 98 | Temp 98.3°F | Wt 160.0 lb

## 2010-09-10 DIAGNOSIS — J45909 Unspecified asthma, uncomplicated: Secondary | ICD-10-CM

## 2010-09-10 DIAGNOSIS — J441 Chronic obstructive pulmonary disease with (acute) exacerbation: Secondary | ICD-10-CM

## 2010-09-10 MED ORDER — PREDNISONE 20 MG PO TABS: 20.0000 mg | ORAL_TABLET | Freq: Two times a day (BID) | ORAL | Status: AC

## 2010-09-10 MED ORDER — BECLOMETHASONE DIPROPIONATE 80 MCG/ACT IN AERS: 2.0000 | INHALATION_SPRAY | Freq: Two times a day (BID) | RESPIRATORY_TRACT | Status: DC

## 2010-09-10 NOTE — Progress Notes (Signed)
  Subjective:    Patient ID: Hannah Scott, female    DOB: 1950/08/06, 60 y.o.   MRN: [id number]  HPI COPD flare over past several weeks w/o trigger Cough:no Sputum production:no Hemoptysis:no Dyspnea (rest/exertional/PND):only DOE Wheezing:yes Chest pain, edema, palpitations:occasional racing Treatment/efficacy: Nebulizer daily or qod Use of rescue inhaler:ran out  1 week ago Use of maintenance inhaler:QVAR 1-2 puffs nightly Smoking:never Past medical history: Asthma since 2 months old; no extrinsic component Family history pulmonary disease: no  The major and minor symptoms of rhinosinusitis were reviewed. These include nasal congestion/obstruction; nasal purulence; facial pain; anosmia;  fever; headache; halitosis; earache and dental pain.None are present.   Review of Systems     Objective:   Physical Exam General appearance is of good health and nourishment; no acute distress or increased work of breathing is present.  No  lymphadenopathy about the head, neck, or axilla noted.   Eyes: No conjunctival inflammation or lid edema is present. There is no scleral icterus.  Ears:  External ear exam shows no significant lesions or deformities.  Otoscopic examination reveals clear canals, tympanic membranes are intact bilaterally without bulging, retraction, inflammation or discharge.  Nose:  External nasal examination shows no deformity or inflammation. Nasal mucosa are pink and moist without lesions or exudates. No septal dislocation or dislocation.No obstruction to airflow.   Oral exam: Dental hygiene is good; lips and gums are healthy appearing.There is no oropharyngeal erythema or exudate noted.   Neck:  No deformities, thyromegaly, masses, or tenderness noted.   [name]   Heart:  Normal rate and regular rhythm. S1 and S2 normal without gallop, murmur, click, rub . S4 gallop   Lungs:Chest clear to auscultation; no wheezes, rhonchi,rales ,or rubs present.No increased work of breathing.BS  decreased    Extremities:  No cyanosis, edema, or clubbing  noted . Marked OA hand changes RUE . Marked scoliosis   Skin: Warm & dry w/o jaundice or tenting.           Assessment & Plan:

## 2010-09-10 NOTE — Discharge Summary (Signed)
NAME:  Hannah Scott, Hannah Scott                             ACCOUNT NO.:  [id number]   MEDICAL RECORD NO.:  [id number]                   PATIENT TYPE:  INP   LOCATION:  3733                                 FACILITY:  [facility]   PHYSICIAN:  Sean A. Everardo All, M.D. [facility]           DATE OF BIRTH:  [birth year]   DATE OF ADMISSION:  07/31/2003  DATE OF DISCHARGE:  08/02/2003                                 DISCHARGE SUMMARY   REASON FOR ADMISSION:  Asthma exacerbation.   HISTORY OF PRESENT ILLNESS:  The patient is a 59 year old woman admitted by  Dr. Alwyn Ren on July 31, 2003 with an asthma exacerbation.  Please refer to  her history and physical for details.   HOSPITAL COURSE:  The patient was admitted and treated with bronchodilators  and steroids.  Her outpatient medications have also continued.  On this, she  has steadily improved.  By August 02, 2003 she still had some wheezing, but  states that she was dramatically improved and was in her opinion ready for  discharge.  She was thus sent home in good condition.   DIAGNOSES AT THE TIME OF DISCHARGE:  1. Asthma exacerbation, probably due to a recent upper respiratory     infection.  2. Other chronic medical problems as noted in the history and physical.   MEDICATIONS:  1. Proventil and Atrovent nebulizer treatment every four hours as needed, as     prior to admission.  2. Foradil 12 mcg inhalation daily.  3. Norvasc 2.5 mg daily.  4. Avelox 400 mg daily for three more days.  5. Monopril 10 mg daily.  6. Synthroid 50 mcg daily except 75 on Wednesdays.  7. Lopressor 25 mg twice daily.  8. Aspirin 325 mg daily.  9. Zocor 20 mg daily.  10.      Prednisone 40 mg daily pending followup appointment.  11.      Effexor XR 75 mg daily.  12.      Singulair 10 mg daily.   No restriction on diet or activity.  Followup will be with Dr. Alwyn Ren within  seven days.                                                Sean A. Everardo All, M.D. [facility]    SAE/MEDQ  D:   08/02/2003  T:  08/03/2003  Job:  [id number]   cc:   Titus Dubin. Alwyn Ren, M.D. [facility]

## 2010-09-10 NOTE — Assessment & Plan Note (Signed)
[facility]                            CARDIOLOGY OFFICE NOTE   NAME:Hannah Scott, Hannah Scott                          MRN:          [id number]  DATE:07/26/2006                            DOB:          05/23/1950    Hannah Scott is a very pleasant 60 year old, obese, white female with  coronary artery disease with subendocardial infarction in 1991 and again  in 1998.   She has had no recent chest discomfort. She has had non-ischemic  Cardiolite a year ago. She also has hyperlipidemia, hypertension and  treated hypothyroidism.   She has had breast cancer treated with lumpectomy, radiation, and  tamoxifen.   Cardiac status is quite stable at this time. She is being seen at [facility]  for evaluation concerning her kyphoscoliosis.   MEDICATIONS:  1. Foradil Aerolizer.  2. Effexor 75.  3. Synthroid 50 mcg daily.  4. Lipitor 10.  5. Lotensin 20.  6. Metoprolol 25 b.i.d.  7. Singulair 10.  8. Pulmicort.  9. Aspirin 325 which was reduced to 162.  10.Norvasc 2.5.   PHYSICAL EXAMINATION:  Blood pressure is 130/93. Pulse 70 and in normal  sinus rhythm.  GENERAL APPEARANCE: Obese.  JVP is not elevated. Carotid pulse palpable  without bruits.  LUNGS:  Clear.  There is significant kyphoscoliosis.  CARDIAC: Normal. There is no murmur.  ABDOMEN: Normal.  EXTREMITIES: Normal.   EKG reveals minor T changes.   Diagnoses as above. The patient is being seen for consultation  concerning possible surgery for kyphoscoliosis. Although the patient has  coronary disease, she had a negative Cardiolite a year ago and should be  at reasonably low risk.   Concerning followup, the patient states that her husband sees Hannah Scott  and hopefully she can follow with Hannah Scott as well.     Cecil Cranker, MD, [facility]  Electronically Signed    EJL/MedQ  DD: 07/26/2006  DT: 07/26/2006  Job #: [id number]

## 2010-09-10 NOTE — Patient Instructions (Signed)
Increase the Qvar to 2 puffs every 12 hours; gargle and spit after use. Use the ProAir  every 4 hours as needed, 1-2 sprays. If you're not dramatically better over 24-36 hours, fill the prescription for the prednisone.

## 2010-09-10 NOTE — H&P (Signed)
NAME:  Hannah Scott, Hannah Scott                             ACCOUNT NO.:  [id number]   MEDICAL RECORD NO.:  [id number]                   PATIENT TYPE:  INP   LOCATION:  3733                                 FACILITY:  [facility]   PHYSICIAN:  Titus Dubin. Alwyn Ren, M.D. [facility]         DATE OF BIRTH:  04/30/1950   DATE OF ADMISSION:  07/31/2003  DATE OF DISCHARGE:                                HISTORY & PHYSICAL   HISTORY OF PRESENT ILLNESS:  Hannah Scott is a 60 year old white female  admitted with status asthmaticus.   Symptoms began July 25, 2003 with shortness of breath and wheezing. She  began taking Augmentin which had been prescribed on March 4 should she have  progression from a viral nonspecific respiratory tract infection to  rhinosinusitis. From March 2 to March 3, she began to have green sputum  despite taking antibiotics. Also during this time, she had an exacerbation  of her asthma; she thought she was better off April 4 through April 6. In  the evening of April 6, she had progression of her asthma symptoms despite  using a nebulizer every four hours.   In the office, she was found to exhibit clinical findings of status  asthmaticus with an O2 saturation of 88% on room air with an inability to  complete a peak flow. Even after nebulizer treatments, peak flow was only 60  liters per minute, and she had audible wheezing at 3 feet prompting  admission.   Admission was necessary not only because of the status asthmaticus but  because she has underlying coronary artery disease and is followed by  [facility] cardiology. Additionally, she has had a lumpectomy in 2003 followed  by radiation. She was hospitalized in 2000 with asthma. She was hospitalized  with status asthmaticus in June of 2004. In 1988, she had total abdominal  hysterectomy and oophorectomy. She has had myocardial infarction in 1991 and  1997.   Medical issues include hypertension, hyperlipidemia, hypothyroidism,  scoliosis with  progressive neuropathy, and the asthma.   FAMILY HISTORY:  Includes breast cancer in a maternal great aunt, stroke in  a grandfather, stroke in maternal great aunt, strokes __________ throughout  the maternal side of family, heart attack in father at 72, and MI in several  paternal uncles. His mother had congestive heart failure. Two uncles had  asthma.   She has never smoked and does not drink. She has headaches when she uses  Advair.   MEDICATIONS:  1. Norvasc 2.5 mg daily.  2. Lipitor 10 mg daily.  3. Lotensin 10 mg daily.  4. Enteric-coated aspirin daily.  5. Singulair 10 mg daily.  6. Synthroid 50 mcg daily except for one and a half on Wednesday.  7. Nebulizer with albuterol and Atrovent every four hours as needed.  8. Foradil inhalation every 12 hours.  9. Toprol 25 mg b.i.d.  10.      Effexor XR  75 mg daily.  11.      Her rescue drugs include Maxair metered dose inhaler.   REVIEW OF SYSTEMS:  Include some dyspepsia. She has no GI symptomatology  otherwise. Specifically, there has been no rectal bleeding and no melena.  She has no genitourinary symptoms.   PHYSICAL EXAMINATION:  GENERAL:  At the time of exam, she was sitting in a  wheelchair with a pulse ranging from 96 to 109, respiratory rate 28 to 30,  blood pressure 130/89.  HEENT:  She has some arterial narrowing. Tympanic membranes are slightly  dull. She has no lymphadenopathy about the head, neck, or axilla.  LUNGS:  She is using accessory muscles and is in obvious respiratory  distress at rest sitting in the wheelchair.  HEART:  She exhibits a tachycardia with a flow murmur. Heart sounds are  somewhat obscured by the diffuse wheezing.  ABDOMEN:  Protuberant but nontender.  EXTREMITIES:  Homan's sign is negative. She has no pedal edema. Pedal pulses  are intact. There is no cyanosis.   ASSESSMENT/PLAN:  She is now admitted with status asthmaticus unresponsive  to nebulizer treatments x2 in the office. It is  recommended that once she is  stable that followup be with pulmonary department as this is her second  episode of status asthmaticus within a year. The concept of early treatment  rather than waiting until she has had problems for several days was  discussed. It is anticipated that consultation with pulmonary may allow such  early intervention and prevent hospitalizations. She will be monitored on  telemetry and cardiac enzymes collected should she have any cardiac  symptoms.                                                Titus Dubin. Alwyn Ren, M.D. [facility]    WFH/MEDQ  D:  08/01/2003  T:  08/01/2003  Job:  [id number]

## 2010-09-10 NOTE — Discharge Summary (Signed)
NAME:  SHEQUITA, PEPLINSKI                             ACCOUNT NO.:  [id number]   MEDICAL RECORD NO.:  [id number]                   PATIENT TYPE:  INP   LOCATION:  5506                                 FACILITY:  [facility]   PHYSICIAN:  Wanda Plump, MD [facility]                 DATE OF BIRTH:  [birth year]   DATE OF ADMISSION:  09/26/2002  DATE OF DISCHARGE:  09/29/2002                                 DISCHARGE SUMMARY   ADMISSION DIAGNOSIS:  Status asthmaticus.   DISCHARGE DIAGNOSES:  1. Status asthmaticus, improved.  2. Coronary artery disease.  3. History of breast cancer, status post lumpectomy and radiation therapy.   BRIEF HISTORY AND PHYSICAL:  Mrs. Bateson is a 60 year old white female who was  admitted by Dr. Alwyn Ren for a history of exacerbation of asthma and a low-  grade fever.  On physical exam, she was in obvious respiratory distress with  a respiratory rate of 28 to 35, despite oxygen and nebulizer treatments, her  pulse was 101, blood pressure 112/92, initial O2 sat was 86 on room air, and  it went up to 94 on 2 liters.  Please see history and physical for further  details.   LABORATORY AND X-RAYS:  Portable chest x-ray showed no acute disease.  Initial CBC showed a white count of 10.9 with a hemoglobin of 12.9 and  platelets of 236.  Sodium was 144, potassium 3.6, chloride 107, CO2 of 27,  glucose 155, creatinine 0.8, calcium 9.2.  Three sets of cardiac enzymes  showed a CK of 301, 275, and 277; all of the three panels had negative  troponin tests and a negative CK as well.   HOSPITAL COURSE:  The patient was admitted to the hospital and started on IV  fluids, IV Solu-Medrol.  She was continued with her home medications,  received nebulization treatments.  She was initially at the step-down unit.  Her condition gradually improved; she was transferred to the regular floor  and was switched from IV antibiotics and steroids to p.o. medications.  She  continued to do well, and by September 29, 2002, she was feeling much better and  willing to go home.   DISCHARGE MEDICATIONS:  At this point, I think she got maximal hospital  benefit, and I am planning to discharge her home on her routine doses of:  1. Effexor.  2. Norvasc.  3. Synthroid.  4. Lipitor.  5. Lopressor.  6. Lotensin.  7. Singulair.  8. Pulmicort.  9. Aspirin.  10.      Tamoxifen.  11.      Additionally, she is going to take prednisone 10 mg three p.o.     daily until she goes to see her primary care doctor, Dr. Alwyn Ren.  12.      She is also going to have albuterol and Atrovent nebulizations four  times per day with an extra one or two nebulizations per day if needed.  13.      I will keep her on Ceftin 250 mg twice per day for seven days and     Zithromax 250 mg once per day for two days.  14.      She is to have over-the-counter Robitussin-DM two teaspoons four     times per day.   FOLLOW UP:  She was advised to call Dr. Alwyn Ren on Monday for an appointment  next week and to return to the emergency room if she feels worse.                                               Wanda Plump, MD [facility]    JEP/MEDQ  D:  09/29/2002  T:  09/29/2002  Job:  [id number]

## 2010-09-10 NOTE — Op Note (Signed)
[place]  Patient:    [name], [name] Visit Number: [id number] MRN: [id number]          Service Type: DSU Location: [facility] 2899 [id number] Attending Physician:  Delsa Bern Dictated by:   Lorne Skeens. Hoxworth, M.D. Proc. Date: 11/07/01 Admit Date:  11/07/2001 Discharge Date: 11/07/2001                             Operative Report  PREOPERATIVE DIAGNOSIS:  Ductal carcinoma in situ, right breast.  POSTOPERATIVE DIAGNOSIS:  Ductal carcinoma in situ, right breast.  PROCEDURE:  Needle-localization right partial mastectomy.  SURGEON:  Lorne Skeens. Hoxworth, M.D.  ANESTHESIA:  General.  BRIEF HISTORY:  Hannah Scott is a 60 year old white female who on a recent screening mammogram was found to have a 2 cm cluster of pleomorphic calcifications in the inferior aspect of the right breast.  A large-core needle biopsy has revealed carcinoma in situ.  Following discussion of treatment options, we elected to proceed with needle-localization right partial mastectomy.  The nature of the procedure, its indications, risks of bleeding, infection, and possible need for further surgery were discussed and understood preoperatively.  She is now brought to the operating room for this procedure.  DESCRIPTION OF PROCEDURE:  The patient was brought to the operating room and placed in the supine position on the operating table, and general endotracheal anesthesia was induced.  She had had a previous needle localization of the clustered calcifications in the inferior right breast.  The breast was sterilely prepped and draped.  A curvilinear incision was made at the areolar border near the wire insertion site, and dissection was carried down to the subcutaneous tissue.  The wire was brought into the incision.  A generous portion of breast tissue was then excised around the shaft of the wire down into the central and inferior breast.  This was sent for specimen  mammography, which confirmed calcifications within the specimens, and they appeared to extend up to the superior anterior margin underneath the nipple.  There was still approximately 0.5-1 cm of breast tissue beneath the nipple, and a further excision was performed of the entire anterior and superior margin back to the skin of the nipple and into the more superior breast tissue.  This was oriented as well and sent for permanent sections.  Hemostasis was obtained with cautery.  The wound was then closed with running subcuticular 4-0 Monocryl and Steri-Strips.  Sponge and instrument counts were correct.  The patient was taken to recovery in good condition. Dictated by:   Lorne Skeens. Hoxworth, M.D. Attending Physician:  Delsa Bern DD:  11/07/01 TD:  11/12/01 Job: [id number]

## 2010-09-10 NOTE — H&P (Signed)
NAME:  Hannah Scott, Hannah Scott                             ACCOUNT NO.:  [id number]   MEDICAL RECORD NO.:  [id number]                   PATIENT TYPE:  INP   LOCATION:  2904                                 FACILITY:  [facility]   PHYSICIAN:  Titus Dubin. Alwyn Ren, M.D. [facility]         DATE OF BIRTH:  Jul 04, 1950   DATE OF ADMISSION:  09/26/2002  DATE OF DISCHARGE:                                HISTORY & PHYSICAL   Her symptoms began on May 31 as a sore throat with almost simultaneous  initiation of some greenish discharge from her head. This progressed to  involve the chest as well with green sputum. Her asthma began to flare on  June 2 in the evening.  She initiated cold pills and used her metered dose  inhaler rescue drug every three hours with only a few minutes of relief with  each treatment.  She had a low grade fever to 100.   When seen in the office, she was using accessory muscles and was unable to  converse.   Significantly, she had a lumpectomy and radiation to the right breast in  2003.  She was hospitalized in 2000 with asthma.  In 1998 she had total  abdominal hysterectomy and oophorectomy. She has had two myocardial  infarctions in 1991 and 1997.   PAST MEDICAL HISTORY:  Include hypothyroidism, depression, hyperlipidemia,  and hypertension.  She also has scoliosis.   REVIEW OF SYSTEMS:  Surprisingly negative except for the acute asthma.  This  was associated with rhinitis and postnasal drainage.  She denies chest pain,  abdominal pain, reflux, or genitourinary symptoms. She has had no  significant musculoskeletal problems in the immediate past.   On May 26, she was seen with some pleuritic pain. At that time, chest x-ray  revealed scoliosis with no definite acute process.  Hiatal hernia was  suggested.  Plan was to perform rib detail or bone scan if the symptoms  persisted.  She was treated with Mobic with apparent relief.   FAMILY HISTORY:  Positive for stroke, breast cancer, and  myocardial  infarction.  Her father died at 76.  Her paternal uncle has had asthma.  Mother had congestive heart failure.   She does not smoke or drink.  She is intolerant to Advair which causes  headaches.   PHYSICAL EXAMINATION:  GENERAL: She is obvious respiratory distress with  respiratory rate of 28 to 35 despite oxygen and nebulized treatment.  VITAL SIGNS:  Pulse 101, blood pressure 112/92.  HEENT:  She has full extraocular motion. She is using accessory muscles of  the neck to breathe and exhibits pursed lip breathing.  Tympanic membranes  are slightly erythematous. There is no purulence in the nares and there is  no significant erythema of the oropharynx. She has no lymphadenopathy about  the head, neck, or axilla.  She exhibits some S4 tach at the apex.  Otherwise the heart  sounds are obscured by the diffuse wheezing.  ABDOMEN:  Protuberant, but nontender.  EXTREMITIES: She has no edema.  Homan's sign is negative.  Pedal pulses are  present.  She has scattered bruising over the forearms.   LABORATORY DATA:  O2 saturations were 86% on room air and 94% on 2 liters of  oxygen.   She was to be admitted for status asthmaticus in the setting of previous  coronary artery disease.  Initially, she declined non-emergency transport to  the hospital, but I explained the risks from a cardiopulmonary standpoint in  view of the low O2 saturations with possible potentiation of angina.  She  remitted and arrangements were made for transport with oxygen.  Because of  this, her coronary artery disease, EKG, and cardiac enzymes will be  collected.  She will be empirically started on Rocephin and Azithromycin.  Pulmonary toilette will be continued.   Her home medications were essentially all continued; they are listed on the  admission orders. The only significant change was that her Pulmicort was  increased to four puffs every 12 hours until stable at which time, it would  be weaned.                                                Titus Dubin. Alwyn Ren, M.D. [facility]    WFH/MEDQ  D:  09/27/2002  T:  09/27/2002  Job:  [id number]

## 2010-09-14 ENCOUNTER — Encounter: Payer: Medicare Other | Attending: Neurosurgery | Admitting: Neurosurgery

## 2010-09-14 DIAGNOSIS — G894 Chronic pain syndrome: Secondary | ICD-10-CM

## 2010-09-14 DIAGNOSIS — M961 Postlaminectomy syndrome, not elsewhere classified: Secondary | ICD-10-CM | POA: Insufficient documentation

## 2010-09-14 DIAGNOSIS — M1610 Unilateral primary osteoarthritis, unspecified hip: Secondary | ICD-10-CM | POA: Insufficient documentation

## 2010-09-14 DIAGNOSIS — M169 Osteoarthritis of hip, unspecified: Secondary | ICD-10-CM | POA: Insufficient documentation

## 2010-09-14 DIAGNOSIS — F3289 Other specified depressive episodes: Secondary | ICD-10-CM | POA: Insufficient documentation

## 2010-09-14 DIAGNOSIS — M545 Low back pain, unspecified: Secondary | ICD-10-CM | POA: Insufficient documentation

## 2010-09-14 DIAGNOSIS — Z79899 Other long term (current) drug therapy: Secondary | ICD-10-CM | POA: Insufficient documentation

## 2010-09-14 DIAGNOSIS — M48061 Spinal stenosis, lumbar region without neurogenic claudication: Secondary | ICD-10-CM

## 2010-09-14 DIAGNOSIS — F329 Major depressive disorder, single episode, unspecified: Secondary | ICD-10-CM | POA: Insufficient documentation

## 2010-09-14 DIAGNOSIS — M4120 Other idiopathic scoliosis, site unspecified: Secondary | ICD-10-CM | POA: Insufficient documentation

## 2010-09-15 NOTE — Assessment & Plan Note (Signed)
HISTORY OF PRESENT ILLNESS:  Hannah Scott is a 60 year old female who is seen here for kyphoscoliosis and back pain.  She also has some right hip osteoarthritis and post laminectomy syndrome.  She has been evaluated in Orthopedic Surgery before.  She has had trouble since her back surgery and still has a severe kyphoscoliotic tilt to the right.  She continues on her hydrocodone 10/325 about 5 times a day because it is the only medicine that she is taking at this point.  She is weaning herself off gabapentin.  Her pain level is about 6-7, activity level is up to level 10.  Pain is same 24 hours a day.  Walking, standing aggravate.  Rest and medication tend to help.  MOBILITY:  She uses a cane.  She is somewhat unstable but she can climb steps.  She does drive.  REVIEW OF SYSTEMS:  Notable for those difficulties as well as some numbness and tingling and trouble with ambulation and depression.  PAST MEDICAL HISTORY/SOCIAL HISTORY:  Unchanged.  PHYSICAL EXAMINATION:  VITAL SIGNS:  Blood pressure is 156/82, pulse 105, respirations 20, and O2 sats 97% on room air. NEUROLOGIC:  She does have good sensation in her lower extremities.  She is about 4/5 on motor strength in lower extremities.  She is alert and oriented x3.  Affect is somewhat depressed.  Constitutionally, she is mildly obese and does walk with a severely kyphotic gait.  IMPRESSION: 1. Severe lumbar scoliosis/stenosis. 2. Some depression.  PLAN: 1. We will go ahead and refill her hydrocodone 10/325 one p.o. up to 5     times a day, 150 with no refill. 2. She will follow up in the clinic in 3 months.  Her questions were     encouraged and answered.     Gerlad Pelzel L. Blima Dessert Electronically Signed    RLW/MedQ D:  09/14/2010 11:16:48  T:  09/15/2010 01:31:15  Job #:  [id number]

## 2010-10-05 ENCOUNTER — Other Ambulatory Visit: Payer: Self-pay | Admitting: Internal Medicine

## 2010-10-05 NOTE — Telephone Encounter (Signed)
Due for labs ASAP TSH (244.9), last TSH in 12/2009 was out of range, we have indicated on several refills that lab appointment is due. Please schedule labs with in 1-2 weeks.  Send phone note back to me to fill meds for 30 day supply once appointment scheduled.

## 2010-10-07 ENCOUNTER — Other Ambulatory Visit: Payer: Self-pay | Admitting: Internal Medicine

## 2010-10-07 NOTE — Telephone Encounter (Signed)
Start Levoxyl 0.25 mg once daily & recheck TSH in 9 weeks( 244.9). Allergic cell count elevated ; otherwise labs very good. [place]  COPIED FROM 12/2009, LABS OVERDUE

## 2010-10-11 NOTE — Telephone Encounter (Signed)
Patient has appt for Fri 6/22 at 9:30 for  TSH (244.9)--asked if she can have cholesterol too---has trouble giving blood so she wants it "all done at once" if she has to get stuck----if so, what are orders and codes??  Thanks  Maralyn Sago will call her back before appt to tell her if she is getting other labs drawn too)

## 2010-10-13 NOTE — Telephone Encounter (Signed)
Lipid/Hep 272.4/995.20  

## 2010-10-14 ENCOUNTER — Other Ambulatory Visit: Payer: Self-pay | Admitting: Internal Medicine

## 2010-10-14 DIAGNOSIS — T887XXA Unspecified adverse effect of drug or medicament, initial encounter: Secondary | ICD-10-CM

## 2010-10-14 DIAGNOSIS — E785 Hyperlipidemia, unspecified: Secondary | ICD-10-CM

## 2010-10-14 DIAGNOSIS — E039 Hypothyroidism, unspecified: Secondary | ICD-10-CM

## 2010-10-14 NOTE — Telephone Encounter (Signed)
Additional labs added for 6/22 lab, called patient and gave her info too

## 2010-10-15 ENCOUNTER — Other Ambulatory Visit (INDEPENDENT_AMBULATORY_CARE_PROVIDER_SITE_OTHER): Payer: Medicare Other

## 2010-10-15 DIAGNOSIS — E039 Hypothyroidism, unspecified: Secondary | ICD-10-CM

## 2010-10-15 DIAGNOSIS — T887XXA Unspecified adverse effect of drug or medicament, initial encounter: Secondary | ICD-10-CM

## 2010-10-15 DIAGNOSIS — I10 Essential (primary) hypertension: Secondary | ICD-10-CM

## 2010-10-15 DIAGNOSIS — E785 Hyperlipidemia, unspecified: Secondary | ICD-10-CM

## 2010-10-15 LAB — HEPATIC FUNCTION PANEL
Alkaline Phosphatase: 81 U/L (ref 39–117)
Bilirubin, Direct: 0.1 mg/dL (ref 0.0–0.3)
Total Bilirubin: 0.5 mg/dL (ref 0.3–1.2)
Total Protein: 6.1 g/dL (ref 6.0–8.3)

## 2010-10-15 LAB — LIPID PANEL
Cholesterol: 147 mg/dL (ref 0–200)
LDL Cholesterol: 83 mg/dL (ref 0–99)
Total CHOL/HDL Ratio: 3
VLDL: 14.2 mg/dL (ref 0.0–40.0)

## 2010-10-15 NOTE — Progress Notes (Signed)
Labs only

## 2010-12-14 ENCOUNTER — Ambulatory Visit: Payer: Medicare Other | Admitting: Neurosurgery

## 2010-12-28 ENCOUNTER — Encounter: Payer: Medicare Other | Attending: Neurosurgery | Admitting: Neurosurgery

## 2010-12-28 DIAGNOSIS — R209 Unspecified disturbances of skin sensation: Secondary | ICD-10-CM | POA: Insufficient documentation

## 2010-12-28 DIAGNOSIS — M545 Low back pain, unspecified: Secondary | ICD-10-CM | POA: Insufficient documentation

## 2010-12-28 DIAGNOSIS — M1610 Unilateral primary osteoarthritis, unspecified hip: Secondary | ICD-10-CM | POA: Insufficient documentation

## 2010-12-28 DIAGNOSIS — F329 Major depressive disorder, single episode, unspecified: Secondary | ICD-10-CM | POA: Insufficient documentation

## 2010-12-28 DIAGNOSIS — R29898 Other symptoms and signs involving the musculoskeletal system: Secondary | ICD-10-CM | POA: Insufficient documentation

## 2010-12-28 DIAGNOSIS — M961 Postlaminectomy syndrome, not elsewhere classified: Secondary | ICD-10-CM | POA: Insufficient documentation

## 2010-12-28 DIAGNOSIS — M4120 Other idiopathic scoliosis, site unspecified: Secondary | ICD-10-CM | POA: Insufficient documentation

## 2010-12-28 DIAGNOSIS — M169 Osteoarthritis of hip, unspecified: Secondary | ICD-10-CM | POA: Insufficient documentation

## 2010-12-28 DIAGNOSIS — M48061 Spinal stenosis, lumbar region without neurogenic claudication: Secondary | ICD-10-CM | POA: Insufficient documentation

## 2010-12-28 DIAGNOSIS — F3289 Other specified depressive episodes: Secondary | ICD-10-CM | POA: Insufficient documentation

## 2010-12-28 NOTE — Assessment & Plan Note (Signed)
This is a patient of Dr. Wynn Banker who is seen for chronic low back pain due to kyphoscoliosis.  She has some right hip osteoarthritis and has had a laminectomy in the past.  She has postlaminectomy syndrome.  She rates her pain as 6 to an 8.  It is a burning pain.  It is tingling and aching.  General activity level is 10.  Pain is worse during the day, evening, and night.  Sleep patterns are poor.  Pain is worse with walking, standing, and most activities.  Rest and medication tend to help.  She uses a cane for ambulation.  She has a kyphotic gait.  She can climb steps and drive.  She is on disability.  REVIEW OF SYSTEMS:  Notable for those difficulties describe above as well as some weakness, paresthesias, and trouble with depression.  No suicidal thoughts or aberrant behaviors.  She does need help with meal prep, household duties, and shopping.  Her Oswestry score is 72.  PAST MEDICAL HISTORY:  Unchanged.  SOCIAL HISTORY:  Unchanged.  FAMILY HISTORY:  Unchanged.  PHYSICAL EXAMINATION:  Her blood pressure 134/69, pulse 75, respirations 16, and O2 sats 96% on room air.  She is weak in her lower extremities. She is about 4/5 with confrontational testing.  Her sensation is intact. Constitutionally, she is within normal limits.  She is alert and orient x3.  She does still appear somewhat depressed and again she has a kyphotic gait.  ASSESSMENT: 1. Severe lumbar scoliosis and stenosis. 2. Depression. 3. Postlaminectomy syndrome with chronic back pain.  PLAN: 1. Refill hydrocodone 10/325 up to 5 times a day, #150 with no refill. 2. She asked about medicine for breakthrough pain given her heart     condition.  We are not going to try any NSAIDs, but we will give     her some tramadol to use b.i.d. as needed, provide her a     prescription for #30 with 2 refills. 3. We did discuss reevaluation with a spine surgeon.  I am going to     refer her to Dr. Sharolyn Douglas.  She may or may not  keep the     appointment, she states, but we did make a referral.  We will see     her back here in 3 months, otherwise.  Her questions were     encouraged and answered.     Amit Meloy L. Blima Dessert Electronically Signed    RLW/MedQ D:  12/28/2010 10:23:39  T:  12/28/2010 12:55:56  Job #:  [id number]

## 2011-01-23 ENCOUNTER — Other Ambulatory Visit: Payer: Self-pay | Admitting: Internal Medicine

## 2011-01-31 ENCOUNTER — Other Ambulatory Visit: Payer: Self-pay | Admitting: Cardiology

## 2011-02-15 ENCOUNTER — Other Ambulatory Visit: Payer: Self-pay | Admitting: Internal Medicine

## 2011-03-23 ENCOUNTER — Ambulatory Visit (INDEPENDENT_AMBULATORY_CARE_PROVIDER_SITE_OTHER): Payer: Medicare Other

## 2011-03-23 DIAGNOSIS — Z23 Encounter for immunization: Secondary | ICD-10-CM

## 2011-03-28 ENCOUNTER — Other Ambulatory Visit: Payer: Self-pay | Admitting: Internal Medicine

## 2011-03-29 ENCOUNTER — Encounter: Payer: Medicare Other | Attending: Neurosurgery

## 2011-03-29 ENCOUNTER — Ambulatory Visit (HOSPITAL_BASED_OUTPATIENT_CLINIC_OR_DEPARTMENT_OTHER): Payer: Medicare Other | Admitting: Physical Medicine & Rehabilitation

## 2011-03-29 DIAGNOSIS — M4120 Other idiopathic scoliosis, site unspecified: Secondary | ICD-10-CM

## 2011-03-29 DIAGNOSIS — M48061 Spinal stenosis, lumbar region without neurogenic claudication: Secondary | ICD-10-CM | POA: Insufficient documentation

## 2011-03-29 DIAGNOSIS — M169 Osteoarthritis of hip, unspecified: Secondary | ICD-10-CM | POA: Insufficient documentation

## 2011-03-29 DIAGNOSIS — M545 Low back pain, unspecified: Secondary | ICD-10-CM | POA: Insufficient documentation

## 2011-03-29 DIAGNOSIS — F329 Major depressive disorder, single episode, unspecified: Secondary | ICD-10-CM | POA: Insufficient documentation

## 2011-03-29 DIAGNOSIS — R29898 Other symptoms and signs involving the musculoskeletal system: Secondary | ICD-10-CM | POA: Insufficient documentation

## 2011-03-29 DIAGNOSIS — M961 Postlaminectomy syndrome, not elsewhere classified: Secondary | ICD-10-CM | POA: Insufficient documentation

## 2011-03-29 DIAGNOSIS — R209 Unspecified disturbances of skin sensation: Secondary | ICD-10-CM | POA: Insufficient documentation

## 2011-03-29 DIAGNOSIS — F3289 Other specified depressive episodes: Secondary | ICD-10-CM | POA: Insufficient documentation

## 2011-03-29 DIAGNOSIS — M25559 Pain in unspecified hip: Secondary | ICD-10-CM

## 2011-03-29 DIAGNOSIS — M1610 Unilateral primary osteoarthritis, unspecified hip: Secondary | ICD-10-CM | POA: Insufficient documentation

## 2011-03-29 NOTE — Assessment & Plan Note (Signed)
REASON FOR VISIT:  Back pain and right hip pain.  HISTORY:  A 60 year old female with chronic back pain.  She has a severe kyphoscoliosis.  She has undergone lumbar fusion surgery and has post laminectomy syndrome.  She also has hip pain.  She has been referred to Dr. Sharolyn Douglas, for surgical referral.  She has had previous right hip x- ray showing osteoarthritis.  There is subchondral sclerosis, hypertrophic spurring on the x-ray done on October 31, 2008.  Her last urine drug screen was consistent on Sep 14, 2010.  Her current pain medicine is hydrocodone 10/325 five times per day.  PHYSICAL EXAMINATION:  BACK:  Her back shows severe kyphoscoliosis.  She has a S-shaped curve, thoracic lumbar.  She walks with a severely kyphotic posture.  She has decreased weightbearing on the right hip which she states is secondary to pain. EXTREMITIES:  Decreased right hip internal rotation.  Left hip has normal internal rotation, no pain, limitation with external rotation. Right quad strength is 4/5 and left is 5/5.  She has normal reflexes bilateral knees and ankles.  IMPRESSION: 1. Lumbar post laminectomy syndrome, has a history of severe     kyphoscoliosis certainly she has many potential causes for back and     hip pain. 2. Right hip osteoarthritis I am not sure whether this is causing her     present right lower extremity complaints, this should not because     the quad weakness but certainly can cause decreased internal     rotation.  PLAN: 1. Given that she is really not getting adequate relief with her     current pain medications, we will convert her to a long-acting i.e.     morphine sulfate 30 mg b.i.d. MS Contin formulation.  Possibility     of needing to go to t.i.d. on this. 2. Right hip injection under ultrasound guidance. 3. Follow up in 2 weeks on her medication change.  At the same time,     we will do the injection.  Discussed with the patient and agrees     with plan.  She is  on aspirin, does not need to come off this prior     to the injection.     Erick Colace, M.D. Electronically Signed    AEK/MedQ D:  03/29/2011 11:11:05  T:  03/29/2011 12:31:56  Job #:  [id number]  cc:   Titus Dubin. Alwyn Ren, MD,FACP,FCCP [phone] [address] [place], [place] [address]  Luis Abed, MD, [facility] [address]  [address] [place] [address]

## 2011-03-31 ENCOUNTER — Encounter: Payer: Self-pay | Admitting: Obstetrics and Gynecology

## 2011-04-12 ENCOUNTER — Ambulatory Visit (HOSPITAL_BASED_OUTPATIENT_CLINIC_OR_DEPARTMENT_OTHER): Payer: Medicare Other | Admitting: Physical Medicine & Rehabilitation

## 2011-04-12 DIAGNOSIS — M1610 Unilateral primary osteoarthritis, unspecified hip: Secondary | ICD-10-CM

## 2011-04-12 NOTE — Procedures (Signed)
NAME:  Hannah Scott, Hannah Scott                   ACCOUNT NO.:  [id number]  MEDICAL RECORD NO.:  [id number]           PATIENT TYPE:  O  LOCATION:  TPC                          FACILITY:  [facility]  PHYSICIAN:  Erick Colace, M.D.DATE OF BIRTH:  May 06, 1950  DATE OF PROCEDURE:  04/12/2011 DATE OF DISCHARGE:                              OPERATIVE REPORT  PROCEDURE:  Right hip intra-articular injection under ultrasound guidance.  INDICATION:  Hip osteoarthritis with pain only partially response to medication management including narcotic analgesics.  Informed consent was obtained after describing risks and benefits of the procedure with the patient.  These include bleeding, bruising, and infection.  She elects to proceed and has given written consent.  The patient placed in a supine position.  Area marked and prepped with Betadine and alcohol after it was pre-scanned with a 4 Hz transducer.  A 22-gauge, 8 cm echo block needle was inserted under long axis views. The junction between the femoral head and femoral neck was targeted. Bone contact was made.  After negative drawback for blood, a solution containing 1 mL of 40 mg/mL Depo-Medrol and 4 mL of 1% lidocaine were injected.  The patient tolerated procedure well.  Postprocedure instructions given.  ADDENDUM:  The patient started on fentanyl patch 25 mcg q.72 h. in place of morphine which produced improved analgesia over hydrocodone, but not able to tolerate from a standpoint of nausea.  We will see in 2 weeks, followup on injection as well as fentanyl patch, may need to go up to 50 mcg.  If the hip injection not helpful, consider referral to Orthopedics for possible replacement.     Erick Colace, M.D. Electronically Signed    AEK/MEDQ  D:  04/12/2011 11:33:28  T:  04/12/2011 13:53:15  Job:  [id number]

## 2011-05-03 ENCOUNTER — Encounter: Payer: Self-pay | Admitting: Internal Medicine

## 2011-05-03 ENCOUNTER — Ambulatory Visit (INDEPENDENT_AMBULATORY_CARE_PROVIDER_SITE_OTHER): Payer: Medicare Other | Admitting: Internal Medicine

## 2011-05-03 ENCOUNTER — Ambulatory Visit (INDEPENDENT_AMBULATORY_CARE_PROVIDER_SITE_OTHER)
Admission: RE | Admit: 2011-05-03 | Discharge: 2011-05-03 | Disposition: A | Discharge status: Home / Self Care | Payer: Medicare Other | Source: Ambulatory Visit | Attending: Internal Medicine | Admitting: Internal Medicine

## 2011-05-03 VITALS — BP 114/80 | HR 78 | Temp 98.6°F | Wt 145.2 lb

## 2011-05-03 DIAGNOSIS — J209 Acute bronchitis, unspecified: Secondary | ICD-10-CM

## 2011-05-03 MED ORDER — PREDNISONE 20 MG PO TABS: 20.0000 mg | ORAL_TABLET | Freq: Two times a day (BID) | ORAL | Status: AC

## 2011-05-03 MED ORDER — AMOXICILLIN-POT CLAVULANATE 875-125 MG PO TABS: 1.0000 | ORAL_TABLET | Freq: Two times a day (BID) | ORAL | Status: DC

## 2011-05-03 NOTE — Patient Instructions (Signed)
QVAR one-2  inhalations every 12 hours; gargle and spit after use Order for x-rays entered into  the computer; these will be performed at [address] [facility]. across from [facility]. No appointment is necessary.  Use ProAir 1-2 puffs every 4-6 hours as needed for acute shortness of breath. A nebulizer will be ordered through [facility].

## 2011-05-03 NOTE — Progress Notes (Signed)
  Subjective:    Patient ID: Hannah Scott, female    DOB: Jun 29, 1950, 61 y.o.   MRN: [id number]  HPI Onset:04/29/11 as dry cough & burning in throat Trigger:no Course:to green sputum Treatment/efficacy:Alks Seltzer , Tylenol with some benefit Associated symptoms/signs:  URI symptoms: No facial pain, frontal headaches,  dental pain,or ear ache/otic discharge. Scant nasal discharge Extrinsic symptoms: No itchy eyes, sneezing, or angioedema Infectious symptoms: temp to 101+ with  chills, sweats Chest symptoms: No pleuritic pain,  Hemoptysis. Increased dyspnea & wheezing GI symptoms: No dyspepsia, dysphagia, reflux symptoms Occupational/environmental exposures:no Smoking history:never ACE inhibitor administration:Benazepril Past medical history/family history pulmonary disease: PMH asthma ; Mother & uncles on both sides had asthma    Review of Systems     Objective:   Physical Exam General appearance:chronically ill appearing ;in  no acute distress .No  lymphadenopathy about the head, neck, or axilla noted.   Eyes: No conjunctival inflammation or lid edema is present.   Ears:  External ear exam shows no significant lesions or deformities.  Otoscopic examination reveals clear canals, tympanic membranes are intact bilaterally without bulging, retraction, inflammation or discharge.  Nose:  External nasal examination shows no deformity or inflammation. Nasal mucosa are dry without lesions or exudates. No septal dislocation or deviation.No obstruction to airflow.   Oral exam: Dental hygiene is good; lips and gums are healthy appearing.There is minimal oropharyngeal erythema w/o  exudate .   Heart:  Normal rate and regular rhythm. S1 and S2 normal without gallop, murmur, click, rub or other extra sounds.   Lungs:Coarse diffuse rhonchi & rales. No rubs present.No  increased work of breathing.    Extremities:  No cyanosis, edema, or clubbing  noted .Osteoarthritic hand changes. Decreased  height.   Skin: Warm & dry      Assessment & Plan:   #1 bronchitis with purulent secretions and fever. Reactive airways as suggested by rhonchi and rales. There is no evidence of rhinosinusitis.  Plan: See orders and recommendations

## 2011-05-06 ENCOUNTER — Encounter: Payer: Medicare Other | Attending: Neurosurgery

## 2011-05-06 ENCOUNTER — Ambulatory Visit (HOSPITAL_BASED_OUTPATIENT_CLINIC_OR_DEPARTMENT_OTHER): Payer: Medicare Other | Admitting: Physical Medicine & Rehabilitation

## 2011-05-06 DIAGNOSIS — R29898 Other symptoms and signs involving the musculoskeletal system: Secondary | ICD-10-CM | POA: Insufficient documentation

## 2011-05-06 DIAGNOSIS — M1610 Unilateral primary osteoarthritis, unspecified hip: Secondary | ICD-10-CM

## 2011-05-06 DIAGNOSIS — M961 Postlaminectomy syndrome, not elsewhere classified: Secondary | ICD-10-CM

## 2011-05-06 DIAGNOSIS — M48061 Spinal stenosis, lumbar region without neurogenic claudication: Secondary | ICD-10-CM | POA: Insufficient documentation

## 2011-05-06 DIAGNOSIS — F3289 Other specified depressive episodes: Secondary | ICD-10-CM | POA: Insufficient documentation

## 2011-05-06 DIAGNOSIS — M545 Low back pain, unspecified: Secondary | ICD-10-CM | POA: Insufficient documentation

## 2011-05-06 DIAGNOSIS — F329 Major depressive disorder, single episode, unspecified: Secondary | ICD-10-CM | POA: Insufficient documentation

## 2011-05-06 DIAGNOSIS — M4120 Other idiopathic scoliosis, site unspecified: Secondary | ICD-10-CM | POA: Insufficient documentation

## 2011-05-06 DIAGNOSIS — R209 Unspecified disturbances of skin sensation: Secondary | ICD-10-CM | POA: Insufficient documentation

## 2011-05-06 DIAGNOSIS — M169 Osteoarthritis of hip, unspecified: Secondary | ICD-10-CM | POA: Insufficient documentation

## 2011-05-07 NOTE — Assessment & Plan Note (Signed)
HISTORY:  Hannah Scott is a 61 year old female, who has severe lumbar scoliosis and spinal stenosis primarily affecting L1-L2 nerve roots on the right side.  In addition, she has severe right hip osteoarthritis, hypertrophic spurring and subchondral sclerosis.  She has been managed with narcotic analgesics initially just with hydrocodone, then we trialed her on fentanyl patch, which she could not tolerate.  We trialed her on MS Contin, which she could not tolerate during the day, but she could tolerate at night.  When she takes it at night, it helps her sleep through the night.  Pain level at last visit was 7/10, currently 7/10, interferes with activity at a 10, however.  Needs help with meal prep, household duties and shopping.  REVIEW OF SYSTEMS:  Positive for weakness, numbness, spasms and depression.  Her current pain medications include hydrocodone 10/325.  She has 141 tablets left.  She can take up to 5 a day.  She still has close to 30 MS Contin 30 mg per day.  PHYSICAL EXAMINATION:  GENERAL:  This is a kyphotic female with profound levoconvex scoliosis. EXTREMITIES:  She has negative straight leg raise test.  Her hip internal rotation on the right side is nil.  On the left, it is normal. External rotation is 75% of normal on the right and normal on the left. Gait is forward flexed.  She bears little weight on the right lower extremity due to pain.  She is unable to extend her right hip.  IMPRESSION: 1. Right hip osteoarthritis.  I think this is contributing to her     overall disability more than she realizes.  2.  Lumbar scoliosis.     Prior history of fusion performed at [facility].  She has multilevel     stenosis as well, but no significant radicular discomfort that I     could appreciate.  PLAN: 1. I will ask her to call Dr. Ophelia Charter to discuss right hip replacement.     She states they have discussed this in the past.  The patient has     some insurance issues and now does have  insurance once again 2. In the meantime, we  will place the patient on hydrocodone 10/325     b.i.d. during the day and morphine ER 30 mg at bedtime.  Discussed     with the patient, agrees with plan.     Erick Colace, M.D. Electronically Signed    AEK/MedQ D:  05/06/2011 16:13:08  T:  05/07/2011 19:42:00  Job #:  [id number]  cc:   Loraine Leriche C. Ophelia Charter, M.D. Fax: [phone]  Titus Dubin. Alwyn Ren, MD,FACP,FCCP [phone] [address] [place], [place] [address]

## 2011-05-23 ENCOUNTER — Telehealth: Payer: Self-pay

## 2011-05-23 NOTE — Telephone Encounter (Signed)
Message left on Voicemail: Patient is still awaiting for Prior Auth process to be completed so that she may receive her medications for her Nebulizer. Patient contacted [facility] in [place] and they stated they have faxed this to our office x 2 for Prior Auth Request

## 2011-05-24 MED ORDER — IPRATROPIUM-ALBUTEROL 0.5-2.5 (3) MG/3ML IN SOLN: 3.0000 mL | RESPIRATORY_TRACT | Status: DC | PRN

## 2011-05-24 NOTE — Telephone Encounter (Signed)
Spoke to pharmacy Rx needs to include Dx code and Rx is dated from 05-06-10 which is expired. New Rx is needed with new info.

## 2011-05-31 ENCOUNTER — Other Ambulatory Visit: Payer: Self-pay

## 2011-05-31 NOTE — Telephone Encounter (Signed)
Patient was here with husband and stated she ran into some problems with [facility] delaying filling her meds and would now like to switch to [facility] in [place].   Dr.Hopper please review dose/instruction for Duo-neb, once reviewed and ok'd send back to me to forward rx to pharmacy

## 2011-05-31 NOTE — Telephone Encounter (Signed)
Generic DuoNeb 1 ampule every 4-6 hours as needed dispense 120

## 2011-05-31 NOTE — Telephone Encounter (Signed)
Mrs.Malenfant called back and indicated [facility] finally got her medication ready and will not charge her for med due to the delay in filling. Patient would like to Void this message

## 2011-06-03 ENCOUNTER — Ambulatory Visit: Payer: Medicare Other | Admitting: Physical Medicine & Rehabilitation

## 2011-06-16 ENCOUNTER — Telehealth: Payer: Self-pay | Admitting: Physical Medicine & Rehabilitation

## 2011-06-16 MED ORDER — MORPHINE SULFATE CR 30 MG PO TB12: 30.0000 mg | ORAL_TABLET | Freq: Every day | ORAL | Status: DC

## 2011-06-16 NOTE — Telephone Encounter (Signed)
Spoke to pt because last time she came in we documented that she wasn't taking this med. She states that you and her discussed her taking this only QHS. Last fill she had on this was 03/29/2011 MS Contin ER 30mg  1 BID #60. Okay to refill? Thanks.

## 2011-06-16 NOTE — Telephone Encounter (Signed)
Pt aware rx is ready for pickup.  

## 2011-06-16 NOTE — Telephone Encounter (Signed)
Pt needs r/f on morphine sulfate.  Would like to p/u today.

## 2011-06-16 NOTE — Telephone Encounter (Signed)
May give Rx for Morphine qhs #30

## 2011-06-21 DIAGNOSIS — N809 Endometriosis, unspecified: Secondary | ICD-10-CM | POA: Insufficient documentation

## 2011-06-29 ENCOUNTER — Ambulatory Visit: Payer: Medicare Other | Admitting: Obstetrics and Gynecology

## 2011-07-01 ENCOUNTER — Ambulatory Visit (HOSPITAL_BASED_OUTPATIENT_CLINIC_OR_DEPARTMENT_OTHER): Payer: Medicare Other | Admitting: Physical Medicine & Rehabilitation

## 2011-07-01 ENCOUNTER — Encounter: Payer: Medicare Other | Attending: Neurosurgery

## 2011-07-01 ENCOUNTER — Encounter: Payer: Self-pay | Admitting: Physical Medicine & Rehabilitation

## 2011-07-01 DIAGNOSIS — M1610 Unilateral primary osteoarthritis, unspecified hip: Secondary | ICD-10-CM | POA: Insufficient documentation

## 2011-07-01 DIAGNOSIS — M545 Low back pain, unspecified: Secondary | ICD-10-CM | POA: Insufficient documentation

## 2011-07-01 DIAGNOSIS — M1611 Unilateral primary osteoarthritis, right hip: Secondary | ICD-10-CM

## 2011-07-01 DIAGNOSIS — M48062 Spinal stenosis, lumbar region with neurogenic claudication: Secondary | ICD-10-CM | POA: Insufficient documentation

## 2011-07-01 DIAGNOSIS — M24559 Contracture, unspecified hip: Secondary | ICD-10-CM

## 2011-07-01 DIAGNOSIS — R29898 Other symptoms and signs involving the musculoskeletal system: Secondary | ICD-10-CM | POA: Insufficient documentation

## 2011-07-01 DIAGNOSIS — M169 Osteoarthritis of hip, unspecified: Secondary | ICD-10-CM | POA: Insufficient documentation

## 2011-07-01 DIAGNOSIS — F3289 Other specified depressive episodes: Secondary | ICD-10-CM | POA: Insufficient documentation

## 2011-07-01 DIAGNOSIS — M4120 Other idiopathic scoliosis, site unspecified: Secondary | ICD-10-CM | POA: Insufficient documentation

## 2011-07-01 DIAGNOSIS — M961 Postlaminectomy syndrome, not elsewhere classified: Secondary | ICD-10-CM | POA: Insufficient documentation

## 2011-07-01 DIAGNOSIS — M24551 Contracture, right hip: Secondary | ICD-10-CM

## 2011-07-01 DIAGNOSIS — R209 Unspecified disturbances of skin sensation: Secondary | ICD-10-CM | POA: Insufficient documentation

## 2011-07-01 DIAGNOSIS — M48061 Spinal stenosis, lumbar region without neurogenic claudication: Secondary | ICD-10-CM | POA: Insufficient documentation

## 2011-07-01 DIAGNOSIS — F329 Major depressive disorder, single episode, unspecified: Secondary | ICD-10-CM | POA: Insufficient documentation

## 2011-07-01 MED ORDER — HYDROCODONE-ACETAMINOPHEN 10-325 MG PO TABS: 1.0000 | ORAL_TABLET | Freq: Three times a day (TID) | ORAL | Status: DC | PRN

## 2011-07-01 MED ORDER — MORPHINE SULFATE CR 30 MG PO TB12: 30.0000 mg | ORAL_TABLET | Freq: Every day | ORAL | Status: DC

## 2011-07-01 NOTE — Progress Notes (Signed)
Subjective:    Patient ID: Hannah Scott, female    DOB: 1951/03/01, 61 y.o.   MRN: [id number]  HPI HISTORY: [name] is a 61 year old female, who has severe lumbar scoliosis  and spinal stenosis primarily affecting L1-L2 nerve roots on the right  side. In addition, she has severe right hip osteoarthritis,  hypertrophic spurring and subchondral sclerosis. She has been managed  with narcotic analgesics initially just with hydrocodone, then we  trialed her on fentanyl patch, which she could not tolerate. We trialed  her on MS Contin, which she could not tolerate during the day, but she  could tolerate at night. When she takes it at night, it helps her sleep  through the night.  Pain Inventory Average Pain 8 Pain Right Now 7 My pain is sharp, burning, tingling and aching  In the last 24 hours, has pain interfered with the following? General activity 10 Relation with others 10 Enjoyment of life 10 What TIME of day is your pain at its worst? Morning, Daytime, Evening, Night Sleep (in general) Poor  Pain is worse with: walking and standing Pain improves with: medication Relief from Meds: 5  Mobility use a cane ability to climb steps?  yes do you drive?  yes  Function disabled: date disabled 2008 I need assistance with the following:  meal prep, household duties and shopping  Neuro/Psych weakness numbness tingling trouble walking depression  Prior Studies Any changes since last visit?  no  Physicians involved in your care Any changes since last visit?  no       Review of Systems  Constitutional: Positive for unexpected weight change (Weight Loss).  HENT: Negative.   Eyes: Negative.   Respiratory: Negative.   Cardiovascular: Negative.   Gastrointestinal: Negative.   Genitourinary: Negative.   Musculoskeletal: Negative.   Skin: Negative.   Neurological: Positive for numbness.  Hematological: Negative.   Psychiatric/Behavioral: Negative.        Objective:   Physical Exam  Constitutional: She is oriented to person, place, and time. She appears well-developed and well-nourished.  HENT:  Head: Normocephalic and atraumatic.  Eyes: Conjunctivae are normal. Pupils are equal, round, and reactive to light.  Neck: Normal range of motion.  Musculoskeletal:       Lumbar back: She exhibits decreased range of motion, tenderness and pain.       Arms:      Scoliosis as illustrated  Neurological: She is alert and oriented to person, place, and time. No sensory deficit. Gait abnormal.  Reflex Scores:      Patellar reflexes are 1+ on the right side and 1+ on the left side.      Achilles reflexes are 1+ on the right side and 1+ on the left side.      Gait is forward flexed at the hips decreased right hip range of motion. Right toe drag Motor strength is normal in the arms Motor strength is 4/5 in the hip flexors, knee extensors, ankle dorsiflexors are 4 minus/5 Hip range of motion is reduced on the right side with internal and external rotation as well as flexion  Psychiatric: She has a normal mood and affect. Her behavior is normal. Judgment and thought content normal.          Assessment & Plan:  1. Lumbar and thoracic scoliosis with chronic pain 2 lumbar postlaminectomy syndrome 3. Lumbar spinal stenosis with intermittent claudication 4. Right hip osteoarthritis severe patient is considering surgery Chronic pain syndrome Discussed medication management although it  is not perfect it does help to some degree and is not producing any significant side effects. Discussed epidural steroid injections at this point we'll hold off. Over a half of the 25 minute visit was spent counseling and coordination care

## 2011-07-01 NOTE — Patient Instructions (Signed)
Hip Hemiarthroplasty The hip joint is located where the upper end of the femur meets the pelvis socket (acetabulum). The femur, or thigh bone, looks like a long stem with a ball on the end. The acetabulum is a socket or cup-like structure in the pelvis, or hip bone. This "ball and socket" allows your hip to move. During Hip hemiarthroplasty, your surgeon removes the diseased bone tissue and cartilage from the hip joint. The healthy parts of the hip are left intact. The head of the femur (the ball) and the socket (acetabulum) is replaced with new, artificial parts. The new hip is made of materials that allow a normal movement of the joint. This surgery usually lasts 2 to 3 hours.  The purpose of this surgery is to reduce pain and improve range of motion. It is one of the most successful joint replacement surgeries. It most often greatly improves the quality of life. LET YOUR CAREGIVERS KNOW ABOUT:  Allergies   Medications taken including herbs, eye drops, over the counter medications, and creams   Use of steroids (by mouth or creams)   Previous problems with anesthetics or Novocaine   Possibility of pregnancy, if this applies   History of blood clots (thrombophlebitis)   History of bleeding or blood problems   Previous surgery   Other health problems  BEFORE THE PROCEDURE  Do not eat or drink anything for as long as directed by your caregiver prior to surgery.   You should be present 60 minutes prior to your procedure or as directed.  Prior to surgery an IV (intravenous line for giving fluids) may be started. You may be given an anesthetic (medications and gas to help you sleep) during the procedure.  AFTER THE PROCEDURE After surgery, you will be taken to the recovery area. There a nurse will watch and check your progress. You may have a catheter (a long, narrow, hollow tube) in your bladder following surgery. The catheter helps you pass your water. Once you're awake, stable, and taking  fluids well, barring other problems you'll be returned to your room. You will receive physical therapy until you are doing well and your caregiver feels it is safe for you to be transferred home or to an extended care facility. HOME CARE INSTRUCTIONS   You may resume normal diet and activities as directed or allowed.   Change dressings if necessary or as directed.   Only take over-the-counter or prescription medicines for pain, discomfort, or fever as directed by your caregiver.  SEEK IMMEDIATE MEDICAL CARE IF: You develop:  Swelling of your calf or leg or develop shortness of breath or chest pain.   Redness, swelling, or increasing pain in the wound.   Pus coming from wound.   An unexplained oral temperature over 102 F (38.9 C) develops.   A foul smell coming from the wound or dressing.   A breaking open of the wound (edges not staying together) after sutures or staples have been removed.  MAKE SURE YOU:   Understand these instructions.   Will watch your condition.   Will get help right away if you are not doing well or get worse.  Document Released: 04/26/2006 Document Revised: 03/31/2011 Document Reviewed: 05/23/2006 [facility] Patient Information 2012 [place], [place].

## 2011-07-14 ENCOUNTER — Other Ambulatory Visit (HOSPITAL_COMMUNITY)
Admission: RE | Admit: 2011-07-14 | Discharge: 2011-07-14 | Disposition: A | Discharge status: Home / Self Care | Payer: Medicare Other | Source: Ambulatory Visit | Attending: Obstetrics and Gynecology | Admitting: Obstetrics and Gynecology

## 2011-07-14 ENCOUNTER — Ambulatory Visit (INDEPENDENT_AMBULATORY_CARE_PROVIDER_SITE_OTHER): Payer: Medicare Other | Admitting: Obstetrics and Gynecology

## 2011-07-14 ENCOUNTER — Encounter: Payer: Self-pay | Admitting: Obstetrics and Gynecology

## 2011-07-14 VITALS — BP 134/80 | Ht <= 58 in | Wt 142.0 lb

## 2011-07-14 DIAGNOSIS — Z01419 Encounter for gynecological examination (general) (routine) without abnormal findings: Secondary | ICD-10-CM

## 2011-07-14 DIAGNOSIS — N816 Rectocele: Secondary | ICD-10-CM

## 2011-07-14 DIAGNOSIS — C50919 Malignant neoplasm of unspecified site of unspecified female breast: Secondary | ICD-10-CM

## 2011-07-14 DIAGNOSIS — M419 Scoliosis, unspecified: Secondary | ICD-10-CM | POA: Insufficient documentation

## 2011-07-14 DIAGNOSIS — N8111 Cystocele, midline: Secondary | ICD-10-CM

## 2011-07-14 DIAGNOSIS — R351 Nocturia: Secondary | ICD-10-CM

## 2011-07-14 DIAGNOSIS — IMO0002 Reserved for concepts with insufficient information to code with codable children: Secondary | ICD-10-CM

## 2011-07-14 DIAGNOSIS — R3915 Urgency of urination: Secondary | ICD-10-CM

## 2011-07-14 NOTE — Progress Notes (Signed)
The patient came back to see me today for further followup. We've been watching her with a large cystocele. She is aware of it. She will have nocturia up to twice a night. Because of her back problems she has to wake up for that and then goes to the bathroom and she isn't sure whether she would wait just wake to void. She will occasionally have some incontinence if she waits to long and is associated with urgency. A lthough we have treated her for UTIs in the past she has been free of infection for a year. She will very occasionally have to use perineal pressure to pass stool. She is having no vaginal bleeding. She has had no pelvic pain. She has not had recurrence of her breast cancer. She had normal mammogram recently. She has had  normal bone densities. 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 Patient has also had a right S&O for endometriosis.  ROS: 12 system review done. Pertinent positives above. Other positives include spinal stenosis with post laminectomy syndrome with pain, hypothyroidism, hyperlipidemia,hypertension, history of myocardial infarction,asthma, osteoarthritis and scoliosis.patient also has a history of migraines.  HEENT: Within normal limits. Kennon Portela present Neck: No masses. Supraclavicular lymph nodes: Not enlarged. Breasts: Examined in both sitting and lying position. Divot in right breast from lumpectomy-no masses. Left breast no masses. Abdomen: Soft no masses guarding or rebound. No hernias. Pelvic: External within normal limits. BUS within normal limits. Vaginal examination shows good estrogen effect, large second-degree cystocele. No enterocele or vault prolapse. Small rectal defect consistent with rectocele.. Cervix and uterus absent. Adnexa within normal limits. Rectovaginal confirmatory. Extremities within normal limits.  Assessment: #1. 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  Plan: Continue yearly mammograms. Discussed anterior and posterior repair if symptoms get worse. Discussed medication for urgency symptoms get worse. I think patient now can come every 2 years unless  she's having a problem. It has been 26 years since her ovary was removed.

## 2011-07-15 LAB — URINALYSIS W MICROSCOPIC + REFLEX CULTURE
Bacteria, UA: NONE SEEN
Bilirubin Urine: NEGATIVE
Casts: NONE SEEN
Crystals: NONE SEEN
Glucose, UA: NEGATIVE mg/dL
Ketones, ur: NEGATIVE mg/dL
Nitrite: NEGATIVE
Specific Gravity, Urine: 1.012 (ref 1.005–1.030)
pH: 6 (ref 5.0–8.0)

## 2011-07-26 ENCOUNTER — Encounter: Payer: Self-pay | Admitting: Physical Medicine & Rehabilitation

## 2011-08-02 ENCOUNTER — Encounter: Payer: Medicare Other | Attending: Physical Medicine & Rehabilitation | Admitting: *Deleted

## 2011-08-02 ENCOUNTER — Encounter: Payer: Self-pay | Admitting: *Deleted

## 2011-08-02 VITALS — BP 148/74 | HR 109 | Resp 18 | Ht 60.0 in | Wt 141.0 lb

## 2011-08-02 DIAGNOSIS — M4120 Other idiopathic scoliosis, site unspecified: Secondary | ICD-10-CM | POA: Insufficient documentation

## 2011-08-02 DIAGNOSIS — M169 Osteoarthritis of hip, unspecified: Secondary | ICD-10-CM

## 2011-08-02 DIAGNOSIS — M48062 Spinal stenosis, lumbar region with neurogenic claudication: Secondary | ICD-10-CM

## 2011-08-02 DIAGNOSIS — Z79899 Other long term (current) drug therapy: Secondary | ICD-10-CM | POA: Insufficient documentation

## 2011-08-02 DIAGNOSIS — M1610 Unilateral primary osteoarthritis, unspecified hip: Secondary | ICD-10-CM | POA: Insufficient documentation

## 2011-08-02 DIAGNOSIS — Z981 Arthrodesis status: Secondary | ICD-10-CM | POA: Insufficient documentation

## 2011-08-02 MED ORDER — HYDROCODONE-ACETAMINOPHEN 10-325 MG PO TABS: 1.0000 | ORAL_TABLET | Freq: Three times a day (TID) | ORAL | Status: DC | PRN

## 2011-08-02 MED ORDER — MORPHINE SULFATE CR 30 MG PO TB12: 30.0000 mg | ORAL_TABLET | Freq: Every day | ORAL | Status: DC

## 2011-08-02 NOTE — Progress Notes (Signed)
Using pain meds as needed. Still experiences severe nausea as level of morphine increases in her system, so she stops it for a few days. Pill counts WNL. No questions voiced for MD. States keeps meds in safe place at home.

## 2011-08-17 ENCOUNTER — Encounter: Payer: Self-pay | Admitting: Cardiology

## 2011-08-17 DIAGNOSIS — E039 Hypothyroidism, unspecified: Secondary | ICD-10-CM | POA: Insufficient documentation

## 2011-08-17 DIAGNOSIS — I10 Essential (primary) hypertension: Secondary | ICD-10-CM | POA: Insufficient documentation

## 2011-08-17 DIAGNOSIS — I2510 Atherosclerotic heart disease of native coronary artery without angina pectoris: Secondary | ICD-10-CM | POA: Insufficient documentation

## 2011-08-17 DIAGNOSIS — I253 Aneurysm of heart: Secondary | ICD-10-CM | POA: Insufficient documentation

## 2011-08-17 DIAGNOSIS — E785 Hyperlipidemia, unspecified: Secondary | ICD-10-CM | POA: Insufficient documentation

## 2011-08-17 DIAGNOSIS — R9430 Abnormal result of cardiovascular function study, unspecified: Secondary | ICD-10-CM | POA: Insufficient documentation

## 2011-08-17 DIAGNOSIS — R0602 Shortness of breath: Secondary | ICD-10-CM | POA: Insufficient documentation

## 2011-08-17 DIAGNOSIS — J45909 Unspecified asthma, uncomplicated: Secondary | ICD-10-CM | POA: Insufficient documentation

## 2011-08-19 ENCOUNTER — Encounter: Payer: Self-pay | Admitting: Cardiology

## 2011-08-19 ENCOUNTER — Ambulatory Visit (INDEPENDENT_AMBULATORY_CARE_PROVIDER_SITE_OTHER): Payer: Medicare Other | Admitting: Cardiology

## 2011-08-19 VITALS — BP 147/77 | HR 75 | Ht 59.0 in | Wt 141.8 lb

## 2011-08-19 DIAGNOSIS — I2510 Atherosclerotic heart disease of native coronary artery without angina pectoris: Secondary | ICD-10-CM

## 2011-08-19 DIAGNOSIS — R5381 Other malaise: Secondary | ICD-10-CM

## 2011-08-19 DIAGNOSIS — R9430 Abnormal result of cardiovascular function study, unspecified: Secondary | ICD-10-CM

## 2011-08-19 DIAGNOSIS — R0989 Other specified symptoms and signs involving the circulatory and respiratory systems: Secondary | ICD-10-CM

## 2011-08-19 DIAGNOSIS — R0602 Shortness of breath: Secondary | ICD-10-CM

## 2011-08-19 DIAGNOSIS — I10 Essential (primary) hypertension: Secondary | ICD-10-CM

## 2011-08-19 NOTE — Assessment & Plan Note (Signed)
At this point the patient's shortness of breath is probably multifactorial. However we will obtain a 2-D echo to reassess LV function. The echo will also assist in deciding if the EKG change represents a significant finding.

## 2011-08-19 NOTE — Assessment & Plan Note (Signed)
The patient's last exercise test was in 2008. She is having some exertional shortness of breath. It is possible that this could represent ischemia. However it does not appear to be a significant recent change. I will consider exercise testing over time.

## 2011-08-19 NOTE — Assessment & Plan Note (Signed)
I feel that the patient's fatigue is not cardiac in origin. She will be checking with her primary physician soon.

## 2011-08-19 NOTE — Assessment & Plan Note (Signed)
Blood pressures controlled. No change in therapy. 

## 2011-08-19 NOTE — Progress Notes (Signed)
HPI  Patient is seen today for her cardiology followup. She does have known coronary artery disease.I saw her last April, 2011. As part of today's evaluation I have carefully reviewed all of the prior cardiac history and updated the current electronic medical record. We know that she had a 90% distal LAD in the past. Nuclear scan in 2008 showed a normal ejection fraction and no ischemia. She's also had normal ejection fraction by echo in 2008. There is history of an atrial septal aneurysm by echo in the past.  Recently she's felt fatigued in general. She does have some shortness of breath. It does not sound like PND orthopnea. There may be an exertional component. She's not having any significant chest pain. She does have some rare palpitations.    Allergies  Allergen Reactions  . Advair Hfa     REACTION: headache    Current Outpatient Prescriptions  Medication Sig Dispense Refill  . albuterol (PROAIR HFA) 108 (90 BASE) MCG/ACT inhaler Inhale 1-2 puffs into the lungs as needed.        [name] amLODipine (NORVASC) 2.5 MG tablet TAKE ONE TABLET BY MOUTH EVERY DAY  90 tablet  1  . aspirin 81 MG tablet Take 81 mg by mouth daily.        . beclomethasone (QVAR) 80 MCG/ACT inhaler Inhale 2 puffs into the lungs every 12 (twelve) hours. Gargle and swallow after use  8.7 Inhaler  11  . benazepril (LOTENSIN) 40 MG tablet TAKE ONE-HALF TABLET BY MOUTH EVERY DAY  90 tablet  0  . HYDROcodone-acetaminophen (NORCO) 10-325 MG per tablet Take 1 tablet by mouth every 8 (eight) hours as needed for pain.  90 tablet  0  . ipratropium-albuterol (DUONEB) 0.5-2.5 (3) MG/3ML SOLN Take 3 mLs by nebulization every 4 (four) hours as needed. Must not be qid unless during exacerbation. Dx 493.90  360 mL  0  . LIPITOR 20 MG tablet TAKE ONE TABLET BY MOUTH EVERY DAY  30 each  10  . metoprolol tartrate (LOPRESSOR) 25 MG tablet TAKE ONE TABLET BY MOUTH TWICE DAILY  180 tablet  1  . morphine (MS CONTIN) 30 MG 12 hr tablet Take 1  tablet (30 mg total) by mouth at bedtime.  30 tablet  0    History   Social History  . Marital Status: Married    Spouse Name: N/A    Number of Children: N/A  . Years of Education: N/A   Occupational History  . Not on file.   Social History Main Topics  . Smoking status: Never Smoker   . Smokeless tobacco: Not on file  . Alcohol Use: No  . Drug Use: No  . Sexually Active: No   Other Topics Concern  . Not on file   Social History Narrative  . No narrative on file    Family History  Problem Relation Age of Onset  . Heart attack Father   . Heart disease Father   . Heart failure Mother   . Pneumonia Mother   . Hypertension Mother   . Heart disease Mother   . Stroke Maternal Grandfather   . Hypertension Maternal Grandfather   . Asthma Paternal Uncle     PAT UNCLES  . Heart attack Paternal Uncle     Past Medical History  Diagnosis Date  . Asthma     PFTs, February, 2011, moderate obstructive disease with response to bronchodilators, normal lung volumes, moderate reduction in diffusing capacity  . Obstructive airway  disease   . Hyperlipidemia   . Hypertension   . Hypothyroidism     Patient has had in the past that she does not need treatment  . CAD (coronary artery disease)     90% distal LAD in the past  /   nuclear, 2008, no ischemia, ejection fraction 70%  . Atrial septal aneurysm     Echo, 2008  . Pinched nerve     lower back  . History of breast cancer   . Kyphoscoliosis   . Shortness of breath   . Back pain   . Overweight   . Endometriosis 1989    RIGHT TUBE  . Endometriosis 1987    LEFT TUBE/OVARY W FOCAL IN-SITU ENDOMETRIAL ADENOCARCINOMA  . Cancer 2002    DUCTAL CIS--S/P LUMPECTOMY, RADIATION AND 6 WEEKS OF TAMOXIFEN  . Scoliosis   . Ejection fraction     EF 60%, echo, October, 2008    Past Surgical History  Procedure Date  . Cataract extraction   . Cardiovascular stress test 09/07/05    Nuclear, was negative  . Breast lumpectomy 2003     radiation on right  . Appendectomy 1987    AT TAH  . Total abdominal hysterectomy 1987    LSO, APPENDECTOMY  . Pelvic laparoscopy 1989    RSO, LYSIS OF ADHESIONS  . Oophorectomy     LSO -RSO  . Back surgery     Fusion  . Ankle surgery     ROS  Patient denies fever, chills, headache, sweats, rash, change in vision, change in hearing, chest pain, cough, nausea vomiting, urinary symptoms. All other systems are reviewed and are negative.  PHYSICAL EXAM  Patient is oriented to person time and place. Affect is normal. She has a significant abnormality with one of her legs causing her gait to be difficult and she walks with a cane. There is no jugulovenous distention. There are no carotid bruits. Lungs are clear. Respiratory effort is nonlabored. Cardiac exam reveals S1 and S2. There are no clicks or significant murmurs. The abdomen is soft. There is no peripheral edema. There are no skin rashes.  Filed Vitals:   08/19/11 1041  BP: 147/77  Pulse: 75  Height: 4\' 11"  (1.499 m)  Weight: 141 lb 12.8 oz (64.32 kg)   EKG is done today and reviewed by me. There is decreased R wave in lead V3. This is a change from the past.  ASSESSMENT & PLAN

## 2011-08-19 NOTE — Patient Instructions (Signed)
Your physician has requested that you have an echocardiogram. Echocardiography is a painless test that uses sound waves to create images of your heart. It provides your doctor with information about the size and shape of your heart and how well your heart's chambers and valves are working. This procedure takes approximately one hour. There are no restrictions for this procedure.  Your physician recommends that you schedule a follow-up appointment in: 8 weeks  

## 2011-08-31 ENCOUNTER — Other Ambulatory Visit: Payer: Self-pay

## 2011-08-31 ENCOUNTER — Ambulatory Visit (HOSPITAL_COMMUNITY): Payer: Medicare Other | Attending: Cardiovascular Disease

## 2011-08-31 DIAGNOSIS — M4120 Other idiopathic scoliosis, site unspecified: Secondary | ICD-10-CM | POA: Insufficient documentation

## 2011-08-31 DIAGNOSIS — R5381 Other malaise: Secondary | ICD-10-CM | POA: Insufficient documentation

## 2011-08-31 DIAGNOSIS — I10 Essential (primary) hypertension: Secondary | ICD-10-CM | POA: Insufficient documentation

## 2011-08-31 DIAGNOSIS — R0609 Other forms of dyspnea: Secondary | ICD-10-CM | POA: Insufficient documentation

## 2011-08-31 DIAGNOSIS — R0989 Other specified symptoms and signs involving the circulatory and respiratory systems: Secondary | ICD-10-CM | POA: Insufficient documentation

## 2011-08-31 DIAGNOSIS — E785 Hyperlipidemia, unspecified: Secondary | ICD-10-CM | POA: Insufficient documentation

## 2011-08-31 DIAGNOSIS — I2510 Atherosclerotic heart disease of native coronary artery without angina pectoris: Secondary | ICD-10-CM

## 2011-08-31 DIAGNOSIS — R0602 Shortness of breath: Secondary | ICD-10-CM

## 2011-09-02 ENCOUNTER — Other Ambulatory Visit (HOSPITAL_COMMUNITY): Payer: Medicare Other

## 2011-09-03 ENCOUNTER — Other Ambulatory Visit: Payer: Self-pay | Admitting: Internal Medicine

## 2011-09-05 ENCOUNTER — Telehealth: Payer: Self-pay | Admitting: Cardiology

## 2011-09-05 NOTE — Telephone Encounter (Signed)
New Problem: ° ° ° °Patient returned your call.  Please call back. °

## 2011-09-05 NOTE — Progress Notes (Signed)
N/A.  LMTC. 

## 2011-09-05 NOTE — Telephone Encounter (Signed)
Pt was given results of echocardiogram.

## 2011-09-06 ENCOUNTER — Ambulatory Visit (HOSPITAL_BASED_OUTPATIENT_CLINIC_OR_DEPARTMENT_OTHER): Payer: Medicare Other | Admitting: Physical Medicine & Rehabilitation

## 2011-09-06 ENCOUNTER — Encounter: Payer: Self-pay | Admitting: Physical Medicine & Rehabilitation

## 2011-09-06 ENCOUNTER — Encounter: Payer: Medicare Other | Attending: Neurosurgery

## 2011-09-06 VITALS — BP 139/69 | HR 89 | Resp 16 | Ht 58.25 in | Wt 140.4 lb

## 2011-09-06 DIAGNOSIS — M961 Postlaminectomy syndrome, not elsewhere classified: Secondary | ICD-10-CM | POA: Insufficient documentation

## 2011-09-06 DIAGNOSIS — F329 Major depressive disorder, single episode, unspecified: Secondary | ICD-10-CM | POA: Insufficient documentation

## 2011-09-06 DIAGNOSIS — M545 Low back pain, unspecified: Secondary | ICD-10-CM | POA: Insufficient documentation

## 2011-09-06 DIAGNOSIS — M48062 Spinal stenosis, lumbar region with neurogenic claudication: Secondary | ICD-10-CM

## 2011-09-06 DIAGNOSIS — M169 Osteoarthritis of hip, unspecified: Secondary | ICD-10-CM | POA: Insufficient documentation

## 2011-09-06 DIAGNOSIS — R209 Unspecified disturbances of skin sensation: Secondary | ICD-10-CM | POA: Insufficient documentation

## 2011-09-06 DIAGNOSIS — R29898 Other symptoms and signs involving the musculoskeletal system: Secondary | ICD-10-CM | POA: Insufficient documentation

## 2011-09-06 DIAGNOSIS — M1610 Unilateral primary osteoarthritis, unspecified hip: Secondary | ICD-10-CM | POA: Insufficient documentation

## 2011-09-06 DIAGNOSIS — M4120 Other idiopathic scoliosis, site unspecified: Secondary | ICD-10-CM | POA: Insufficient documentation

## 2011-09-06 DIAGNOSIS — M24559 Contracture, unspecified hip: Secondary | ICD-10-CM

## 2011-09-06 DIAGNOSIS — M24551 Contracture, right hip: Secondary | ICD-10-CM

## 2011-09-06 DIAGNOSIS — M1611 Unilateral primary osteoarthritis, right hip: Secondary | ICD-10-CM

## 2011-09-06 DIAGNOSIS — F3289 Other specified depressive episodes: Secondary | ICD-10-CM | POA: Insufficient documentation

## 2011-09-06 DIAGNOSIS — M48061 Spinal stenosis, lumbar region without neurogenic claudication: Secondary | ICD-10-CM | POA: Insufficient documentation

## 2011-09-06 MED ORDER — HYDROCODONE-ACETAMINOPHEN 10-325 MG PO TABS: 1.0000 | ORAL_TABLET | Freq: Three times a day (TID) | ORAL | Status: DC | PRN

## 2011-09-06 MED ORDER — MORPHINE SULFATE ER 30 MG PO TBCR: 30.0000 mg | EXTENDED_RELEASE_TABLET | Freq: Every day | ORAL | Status: DC

## 2011-09-06 NOTE — Patient Instructions (Signed)
I recommend that you see Dr. Ophelia Charter for a orthopedic consultation in regards to your hip  If you're nerve pain increases in the right thigh we can do an epidural injection

## 2011-09-06 NOTE — Progress Notes (Signed)
  Subjective:    Patient ID: Hannah Scott, female    DOB: 1950/06/12, 61 y.o.   MRN: [id number]  HPI HISTORY: Hannah Scott is a 61 year old female, who has severe lumbar scoliosis  and spinal stenosis primarily affecting L1-L2 nerve roots on the right  side. In addition, she has severe right hip osteoarthritis,  hypertrophic spurring and subchondral sclerosis. She has been managed  with narcotic analgesics initially just with hydrocodone, then we  trialed her on fentanyl patch, which she could not tolerate. We trialed  her on MS Contin, which she could not tolerate during the day, but she  could tolerate at night. When she takes it at night, it helps her sleep  through the night.   Pain Inventory Average Pain 7 Pain Right Now 6 My pain is constant, sharp, burning, tingling and aching  In the last 24 hours, has pain interfered with the following? General activity 10 Relation with others 10 Enjoyment of life 10 What TIME of day is your pain at its worst? all of the time Sleep (in general) Poor  Pain is worse with: walking and standing Pain improves with: rest and medication Relief from Meds: 4  Mobility use a cane ability to climb steps?  yes do you drive?  yes  Function disabled: date disabled 2008 I need assistance with the following:  meal prep, household duties and shopping  Neuro/Psych weakness numbness tingling depression  Prior Studies Any changes since last visit?  no  Physicians involved in your care Any changes since last visit?  no        Review of Systems  Neurological: Positive for numbness.  Psychiatric/Behavioral: Positive for dysphoric mood.  All other systems reviewed and are negative.       Objective:   Physical Exam        Assessment & Plan:   1. Lumbar and thoracic scoliosis with chronic pain  2 lumbar postlaminectomy syndrome  3. Lumbar spinal stenosis with intermittent claudication  4. Right hip osteoarthritis severe patient is  considering surgery  Chronic pain syndrome  Discussed medication management although it is not perfect it does help to some degree and is not producing any significant side effects.  Discussed epidural steroid injections at this point we'll hold off.  Over a half of the 25 minute visit was spent counseling and coordination care

## 2011-10-06 ENCOUNTER — Encounter: Payer: Medicare Other | Attending: Physical Medicine & Rehabilitation | Admitting: Physical Medicine and Rehabilitation

## 2011-10-06 ENCOUNTER — Encounter: Payer: Self-pay | Admitting: Physical Medicine and Rehabilitation

## 2011-10-06 VITALS — BP 145/74 | HR 83 | Resp 14 | Ht 59.0 in | Wt 143.0 lb

## 2011-10-06 DIAGNOSIS — Z853 Personal history of malignant neoplasm of breast: Secondary | ICD-10-CM | POA: Insufficient documentation

## 2011-10-06 DIAGNOSIS — M24559 Contracture, unspecified hip: Secondary | ICD-10-CM

## 2011-10-06 DIAGNOSIS — M48062 Spinal stenosis, lumbar region with neurogenic claudication: Secondary | ICD-10-CM

## 2011-10-06 DIAGNOSIS — R209 Unspecified disturbances of skin sensation: Secondary | ICD-10-CM | POA: Insufficient documentation

## 2011-10-06 DIAGNOSIS — J45909 Unspecified asthma, uncomplicated: Secondary | ICD-10-CM | POA: Insufficient documentation

## 2011-10-06 DIAGNOSIS — M545 Low back pain, unspecified: Secondary | ICD-10-CM | POA: Insufficient documentation

## 2011-10-06 DIAGNOSIS — G8929 Other chronic pain: Secondary | ICD-10-CM | POA: Insufficient documentation

## 2011-10-06 DIAGNOSIS — M961 Postlaminectomy syndrome, not elsewhere classified: Secondary | ICD-10-CM

## 2011-10-06 DIAGNOSIS — I10 Essential (primary) hypertension: Secondary | ICD-10-CM | POA: Insufficient documentation

## 2011-10-06 DIAGNOSIS — F3289 Other specified depressive episodes: Secondary | ICD-10-CM | POA: Insufficient documentation

## 2011-10-06 DIAGNOSIS — Z981 Arthrodesis status: Secondary | ICD-10-CM | POA: Insufficient documentation

## 2011-10-06 DIAGNOSIS — E785 Hyperlipidemia, unspecified: Secondary | ICD-10-CM | POA: Insufficient documentation

## 2011-10-06 DIAGNOSIS — M169 Osteoarthritis of hip, unspecified: Secondary | ICD-10-CM

## 2011-10-06 DIAGNOSIS — F329 Major depressive disorder, single episode, unspecified: Secondary | ICD-10-CM | POA: Insufficient documentation

## 2011-10-06 DIAGNOSIS — M4120 Other idiopathic scoliosis, site unspecified: Secondary | ICD-10-CM | POA: Insufficient documentation

## 2011-10-06 DIAGNOSIS — G43909 Migraine, unspecified, not intractable, without status migrainosus: Secondary | ICD-10-CM | POA: Insufficient documentation

## 2011-10-06 DIAGNOSIS — E039 Hypothyroidism, unspecified: Secondary | ICD-10-CM | POA: Insufficient documentation

## 2011-10-06 DIAGNOSIS — I2510 Atherosclerotic heart disease of native coronary artery without angina pectoris: Secondary | ICD-10-CM | POA: Insufficient documentation

## 2011-10-06 MED ORDER — HYDROCODONE-ACETAMINOPHEN 10-325 MG PO TABS: 1.0000 | ORAL_TABLET | Freq: Three times a day (TID) | ORAL | Status: DC | PRN

## 2011-10-06 MED ORDER — MORPHINE SULFATE ER 30 MG PO TBCR: 30.0000 mg | EXTENDED_RELEASE_TABLET | Freq: Every day | ORAL | Status: DC

## 2011-10-06 NOTE — Patient Instructions (Signed)
Continue with medication, continue with walking program. 

## 2011-10-06 NOTE — Progress Notes (Signed)
Subjective:    Patient ID: Hannah Scott, female    DOB: Dec 29, 1950, 61 y.o.   MRN: [id number]  HPI The patient complains about chronic low back pain pain which radiates into right LE.  The patient also complains about numbness and tingling, intermittendly  After prolonged standing. The problem has been stable .  Pain Inventory Average Pain 7 Pain Right Now 6 My pain is constant, burning, tingling and aching  In the last 24 hours, has pain interfered with the following? General activity 10 Relation with others 10 Enjoyment of life 10 What TIME of day is your pain at its worst? all the time Sleep (in general) Poor  Pain is worse with: walking and standing Pain improves with: rest and medication Relief from Meds: 4  Mobility use a cane ability to climb steps?  yes do you drive?  yes  Function disabled: date disabled 2008 I need assistance with the following:  meal prep, household duties and shopping  Neuro/Psych weakness numbness tingling trouble walking depression  Prior Studies Any changes since last visit?  no  Physicians involved in your care Any changes since last visit?  no   Family History  Problem Relation Age of Onset  . Heart attack Father   . Heart disease Father   . Heart failure Mother   . Pneumonia Mother   . Hypertension Mother   . Heart disease Mother   . Stroke Maternal Grandfather   . Hypertension Maternal Grandfather   . Asthma Paternal Uncle     PAT UNCLES  . Heart attack Paternal Uncle    History   Social History  . Marital Status: Married    Spouse Name: N/A    Number of Children: N/A  . Years of Education: N/A   Social History Main Topics  . Smoking status: Never Smoker   . Smokeless tobacco: Never Used  . Alcohol Use: No  . Drug Use: No  . Sexually Active: No   Other Topics Concern  . None   Social History Narrative  . None   Past Surgical History  Procedure Date  . Cataract extraction   . Cardiovascular stress  test 09/07/05    Nuclear, was negative  . Breast lumpectomy 2003    radiation on right  . Appendectomy 1987    AT [facility]  . Total abdominal hysterectomy 1987    LSO, APPENDECTOMY  . Pelvic laparoscopy 1989    RSO, LYSIS OF ADHESIONS  . Oophorectomy     LSO -RSO  . Back surgery     Fusion  . Ankle surgery    Past Medical History  Diagnosis Date  . Asthma     PFTs, February, 2011, moderate obstructive disease with response to bronchodilators, normal lung volumes, moderate reduction in diffusing capacity  . Obstructive airway disease   . Hyperlipidemia   . Hypertension   . Hypothyroidism     Patient has had in the past that she does not need treatment  . CAD (coronary artery disease)     90% distal LAD in the past  /   nuclear, 2008, no ischemia, ejection fraction 70%  . Atrial septal aneurysm     Echo, 2008  . Pinched nerve     lower back  . History of breast cancer   . Kyphoscoliosis   . Shortness of breath   . Back pain   . Overweight   . Endometriosis 1989    RIGHT TUBE  . Endometriosis 1987  LEFT TUBE/OVARY W FOCAL IN-SITU ENDOMETRIAL ADENOCARCINOMA  . Cancer 2002    DUCTAL CIS--S/P LUMPECTOMY, RADIATION AND 6 WEEKS OF TAMOXIFEN  . Scoliosis   . Ejection fraction     EF 60%, echo, October, 2008   BP 145/74  Pulse 83  Resp 14  Ht 4\' 11"  (1.499 m)  Wt 143 lb (64.864 kg)  BMI 28.88 kg/m2  SpO2 96%     Review of Systems  Neurological: Positive for weakness and numbness.  All other systems reviewed and are negative.       Objective:   Physical Exam  Symmetric normal motor tone is noted throughout. Normal muscle bulk. Muscle testing reveals 5/5 muscle strength of the upper extremity, and 5/5 of the lower extremity. Full range of motion in upper and lower extremities. ROM of spine is severely restricted.   DTR in the upper and lower extremity are present and symmetric 2+. No clonus is noted.  Patient arises from chair with difficulty.Wide based gait with  extreme kyphotic posture, almost in a 90 degree angle . Severe thoracolumbar scoliosis.      Assessment & Plan:  This is year old female female with 1. Chronic low back pain, status post posterior fusion in the lumbar spine 2. Severe scoliosis 3. Migraines 4. CAD 5. depression Plan : Continue with medication, refilled pain medication today. Continue with walking as complains allow. Walking or exercising in the pool would be beneficial for the patient, but she has no access to a pool. Follow up in 1 month.

## 2011-10-07 ENCOUNTER — Ambulatory Visit: Payer: Medicare Other | Admitting: Physical Medicine and Rehabilitation

## 2011-10-17 ENCOUNTER — Other Ambulatory Visit: Payer: Self-pay | Admitting: Internal Medicine

## 2011-10-19 ENCOUNTER — Ambulatory Visit: Payer: Medicare Other | Admitting: Cardiology

## 2011-10-20 ENCOUNTER — Ambulatory Visit (INDEPENDENT_AMBULATORY_CARE_PROVIDER_SITE_OTHER): Payer: Medicare Other | Admitting: Internal Medicine

## 2011-10-20 ENCOUNTER — Encounter: Payer: Self-pay | Admitting: Internal Medicine

## 2011-10-20 VITALS — BP 120/78 | HR 79 | Temp 98.3°F | Wt 142.8 lb

## 2011-10-20 DIAGNOSIS — R946 Abnormal results of thyroid function studies: Secondary | ICD-10-CM

## 2011-10-20 DIAGNOSIS — R5381 Other malaise: Secondary | ICD-10-CM

## 2011-10-20 DIAGNOSIS — E785 Hyperlipidemia, unspecified: Secondary | ICD-10-CM

## 2011-10-20 MED ORDER — SERTRALINE HCL 50 MG PO TABS: ORAL_TABLET | ORAL | Status: DC

## 2011-10-20 NOTE — Patient Instructions (Addendum)
Please try to go on My Chart within the next 24 hours to allow me to release the results directly to you.  

## 2011-10-20 NOTE — Progress Notes (Signed)
Subjective:    Patient ID: Hannah Scott, female    DOB: 08-Sep-1950, 61 y.o.   MRN: [id number]  HPI FATIGUE Onset: several months  Fatigue @ rest : yes   ; with exertion: in context of RAD & deconditioning  Primarily motivational fatigue: yes  Primarily physical fatigue: no New medications: no                                                                       PMH/ FH of thyroid disease: thyroid supplement D/Ced last year 6/12 because her TSH was 1.97 off thyroid supplement. Her TSH had been 9.65 in September 2011    Review of Systems Fever/ chills : no  Night sweats: yes                                                                                           Vision changes ( blurred/ double/ loss): no                                                                                                 Hoarseness or swallowing dysfunction: no                                                                                         Bowel changes( constipation/ diarrhea): no                                                                                     Weight change: lost 25-30 lbs in past year, weight stable in past several months  Exertional chest pain: no Dyspnea on exertion: stable Cough: no Hemoptysis: no Leg swelling: no Orthopnea: no PND: no Melena/ rectal bleeding: no Adenopathy: no Severe snoring: no  Daytime sleepiness: yes Skin / hair / nail changes: no Temperature intolerance( heat/  cold) : hot flashes                                                                                      Feeling depressed: yes  Anhedonia:yes  Altered appetite: yes  Poor sleep/ Apnea : poor sleep, has not noticed apnea Abnormal bruising / bleeding or enlarged lymph nodes: bruises easily, no change from baseline     Objective:   Physical Exam Gen.:  well-nourished in appearance. Alert, appropriate and cooperative throughout exam but appears fatigued Head: Normocephalic without obvious  abnormalities  Eyes: No corneal or conjunctival inflammation noted. No lid lag.Extraocular motion intact.  Neck: No deformities, masses, or tenderness noted. Range of motion slightly limited. Thyroid enlarged. Lungs: Normal respiratory effort. Lungs are essentially clear to auscultation without rales, wheezes, or increased work of breathing. Heart: Normal rate and rhythm. Normal S1; accentuated S2. No gallop, click, or rub. Grade 1/2 over 6 systolic murmur   Abdomen: Bowel sounds normal; abdomen soft and nontender. No masses, organomegaly or hernias noted. Genitalia: deferred   .                                                                                   Musculoskeletal/extremities: Significant scoliosis. No clubbing, cyanosis,or edema noted. Arthritic changes of DIP and PIP joints. Range of motion  normal .Tone & strength  normal.Joints normal. Nail health  good. Vascular: Dorsalis pedis pulses are full and equal.  Neurologic: Alert and oriented x3. Deep tendon reflexes symmetrical and normal.          Skin: Intact without suspicious lesions or rashes. Lymph: No cervical, axillary lymphadenopathy present. Psych: Mood and affect are normal. Normally interactive                                                                                         Assessment & Plan:  #1 fatigue, multifactorial  #2 past medical history of abnormal thyroid function test  #3 clinical neurotransmitter deficiency  Plan: See orders & recommendations

## 2011-10-21 LAB — CBC WITH DIFFERENTIAL/PLATELET
Basophils Absolute: 0 10*3/uL (ref 0.0–0.1)
Eosinophils Relative: 6.1 % — ABNORMAL HIGH (ref 0.0–5.0)
HCT: 39.2 % (ref 36.0–46.0)
Hemoglobin: 12.8 g/dL (ref 12.0–15.0)
Lymphocytes Relative: 19.4 % (ref 12.0–46.0)
Monocytes Relative: 8.8 % (ref 3.0–12.0)
Platelets: 262 10*3/uL (ref 150.0–400.0)
RDW: 13.8 % (ref 11.5–14.6)
WBC: 5.5 10*3/uL (ref 4.5–10.5)

## 2011-10-21 LAB — HEPATIC FUNCTION PANEL
ALT: 18 U/L (ref 0–35)
Albumin: 4 g/dL (ref 3.5–5.2)
Total Protein: 6.5 g/dL (ref 6.0–8.3)

## 2011-10-21 LAB — T4, FREE: Free T4: 1.32 ng/dL (ref 0.60–1.60)

## 2011-10-21 LAB — LIPID PANEL
Cholesterol: 132 mg/dL (ref 0–200)
HDL: 64.7 mg/dL (ref >39.00)
LDL Cholesterol: 57 mg/dL (ref 0–99)
Triglycerides: 54 mg/dL (ref 0.0–149.0)
VLDL: 10.8 mg/dL (ref 0.0–40.0)

## 2011-10-21 LAB — BASIC METABOLIC PANEL
CO2: 29 mEq/L (ref 19–32)
Chloride: 105 mEq/L (ref 96–112)
Glucose, Bld: 91 mg/dL (ref 70–99)
Sodium: 141 mEq/L (ref 135–145)

## 2011-10-25 ENCOUNTER — Encounter: Payer: Self-pay | Admitting: *Deleted

## 2011-10-25 ENCOUNTER — Other Ambulatory Visit (INDEPENDENT_AMBULATORY_CARE_PROVIDER_SITE_OTHER): Payer: Medicare Other

## 2011-10-25 ENCOUNTER — Telehealth: Payer: Self-pay | Admitting: Internal Medicine

## 2011-10-25 DIAGNOSIS — E039 Hypothyroidism, unspecified: Secondary | ICD-10-CM

## 2011-10-25 NOTE — Telephone Encounter (Signed)
Pt coming in today @ 3pm for labs.

## 2011-10-25 NOTE — Telephone Encounter (Signed)
Message copied by Marshell Garfinkel on Tue Oct 25, 2011 11:06 AM ------      Message from: Pecola Lawless      Created: Mon Oct 24, 2011  4:29 PM       Please call to schedule Free T3      ----- Message -----         From: Vance Gather         Sent: 10/24/2011   4:11 PM           To: Pecola Lawless, MD             T3 free can not be added pt will have to come back in for  Blood draw..      ----- Message -----         From: Pecola Lawless, MD         Sent: 10/23/2011   4:46 PM           To: Vance Gather            Please add a free T3 if possible. If not, ask patient to return for this (794.5)

## 2011-10-26 LAB — T3, FREE: T3, Free: 4.5 pg/mL — ABNORMAL HIGH (ref 2.3–4.2)

## 2011-10-27 ENCOUNTER — Other Ambulatory Visit: Payer: Self-pay | Admitting: Internal Medicine

## 2011-10-27 DIAGNOSIS — E0590 Thyrotoxicosis, unspecified without thyrotoxic crisis or storm: Secondary | ICD-10-CM

## 2011-10-28 ENCOUNTER — Encounter: Payer: Self-pay | Admitting: Cardiology

## 2011-10-28 ENCOUNTER — Encounter: Payer: Self-pay | Admitting: *Deleted

## 2011-10-31 ENCOUNTER — Ambulatory Visit: Payer: Medicare Other | Admitting: Cardiology

## 2011-10-31 ENCOUNTER — Telehealth: Payer: Self-pay | Admitting: Internal Medicine

## 2011-10-31 NOTE — Telephone Encounter (Signed)
The thyroid function test suggests overactive thyroid. The uptake scan will help to determine whether this can be treated with radioactive iodine to correct the hyperactive state. If she does not  feel comfortable with these recommendations; she should come back and review these results.

## 2011-10-31 NOTE — Telephone Encounter (Signed)
I spoke with patient and discussed labs (copy was mailed-patient did not receive yet). After discussing labs patient ok'd having uptake scan

## 2011-10-31 NOTE — Telephone Encounter (Signed)
Patient is scheduled for her Thyroid Uptake scan on 7/15 & 11/08/11 of next week.  When I called to inform patient of her appointment, she states she doesn't know why she is having this scan, and what this scan is.  Please inform patient.

## 2011-11-03 ENCOUNTER — Encounter: Payer: Self-pay | Admitting: Physical Medicine and Rehabilitation

## 2011-11-03 ENCOUNTER — Encounter: Payer: Medicare Other | Attending: Physical Medicine & Rehabilitation | Admitting: Physical Medicine and Rehabilitation

## 2011-11-03 VITALS — BP 138/46 | HR 76 | Resp 16 | Ht 59.0 in | Wt 144.8 lb

## 2011-11-03 DIAGNOSIS — I10 Essential (primary) hypertension: Secondary | ICD-10-CM | POA: Insufficient documentation

## 2011-11-03 DIAGNOSIS — J45909 Unspecified asthma, uncomplicated: Secondary | ICD-10-CM | POA: Insufficient documentation

## 2011-11-03 DIAGNOSIS — F329 Major depressive disorder, single episode, unspecified: Secondary | ICD-10-CM | POA: Insufficient documentation

## 2011-11-03 DIAGNOSIS — Z981 Arthrodesis status: Secondary | ICD-10-CM | POA: Insufficient documentation

## 2011-11-03 DIAGNOSIS — E039 Hypothyroidism, unspecified: Secondary | ICD-10-CM | POA: Insufficient documentation

## 2011-11-03 DIAGNOSIS — E785 Hyperlipidemia, unspecified: Secondary | ICD-10-CM | POA: Insufficient documentation

## 2011-11-03 DIAGNOSIS — Z853 Personal history of malignant neoplasm of breast: Secondary | ICD-10-CM | POA: Insufficient documentation

## 2011-11-03 DIAGNOSIS — G43909 Migraine, unspecified, not intractable, without status migrainosus: Secondary | ICD-10-CM | POA: Insufficient documentation

## 2011-11-03 DIAGNOSIS — M4120 Other idiopathic scoliosis, site unspecified: Secondary | ICD-10-CM | POA: Insufficient documentation

## 2011-11-03 DIAGNOSIS — G8929 Other chronic pain: Secondary | ICD-10-CM | POA: Insufficient documentation

## 2011-11-03 DIAGNOSIS — M419 Scoliosis, unspecified: Secondary | ICD-10-CM

## 2011-11-03 DIAGNOSIS — F3289 Other specified depressive episodes: Secondary | ICD-10-CM | POA: Insufficient documentation

## 2011-11-03 DIAGNOSIS — M545 Low back pain, unspecified: Secondary | ICD-10-CM

## 2011-11-03 DIAGNOSIS — R209 Unspecified disturbances of skin sensation: Secondary | ICD-10-CM | POA: Insufficient documentation

## 2011-11-03 DIAGNOSIS — I2510 Atherosclerotic heart disease of native coronary artery without angina pectoris: Secondary | ICD-10-CM | POA: Insufficient documentation

## 2011-11-03 MED ORDER — HYDROCODONE-ACETAMINOPHEN 10-325 MG PO TABS: 1.0000 | ORAL_TABLET | Freq: Three times a day (TID) | ORAL | Status: DC | PRN

## 2011-11-03 MED ORDER — MORPHINE SULFATE ER 30 MG PO TBCR: 30.0000 mg | EXTENDED_RELEASE_TABLET | Freq: Every day | ORAL | Status: DC

## 2011-11-03 NOTE — Progress Notes (Signed)
Subjective:    Patient ID: Hannah Scott, female    DOB: Aug 19, 1950, 61 y.o.   MRN: [id number]  HPI The patient complains about chronic low back pain pain which radiates into right LE. The patient also complains about numbness and tingling, intermittendly After prolonged standing.  The problem has been stable .   Pain Inventory Average Pain 7 Pain Right Now 6 My pain is constant, burning, tingling and aching  In the last 24 hours, has pain interfered with the following? General activity 10 Relation with others 10 Enjoyment of life 10 What TIME of day is your pain at its worst? all of the time Sleep (in general) Poor  Pain is worse with: walking and standing Pain improves with: medication Relief from Meds: 4  Mobility use a cane ability to climb steps?  yes do you drive?  yes  Function disabled: date disabled 2008 I need assistance with the following:  meal prep, household duties and shopping  Neuro/Psych weakness numbness tingling trouble walking depression  Prior Studies Any changes since last visit?  no  Physicians involved in your care Any changes since last visit?  no   Family History  Problem Relation Age of Onset  . Heart attack Father   . Heart disease Father   . Heart failure Mother   . Pneumonia Mother   . Hypertension Mother   . Heart disease Mother   . Stroke Maternal Grandfather   . Hypertension Maternal Grandfather   . Asthma Paternal Uncle     PAT UNCLES  . Heart attack Paternal Uncle    History   Social History  . Marital Status: Married    Spouse Name: N/A    Number of Children: N/A  . Years of Education: N/A   Social History Main Topics  . Smoking status: Never Smoker   . Smokeless tobacco: Never Used  . Alcohol Use: No  . Drug Use: No  . Sexually Active: No   Other Topics Concern  . None   Social History Narrative  . None   Past Surgical History  Procedure Date  . Cataract extraction   . Cardiovascular stress test  09/07/05    Nuclear, was negative  . Breast lumpectomy 2003    radiation on right  . Appendectomy 1987    AT [facility]  . Total abdominal hysterectomy 1987    LSO, APPENDECTOMY  . Pelvic laparoscopy 1989    RSO, LYSIS OF ADHESIONS  . Oophorectomy     LSO -RSO  . Back surgery     Fusion  . Ankle surgery    Past Medical History  Diagnosis Date  . Asthma     PFTs, February, 2011, moderate obstructive disease with response to bronchodilators, normal lung volumes, moderate reduction in diffusing capacity  . Obstructive airway disease   . Hyperlipidemia   . Hypertension   . Hypothyroidism     Patient has had in the past that she does not need treatment  . CAD (coronary artery disease)     90% distal LAD in the past  /   nuclear, 2008, no ischemia, ejection fraction 70%  . Atrial septal aneurysm     Echo, 2008  . Pinched nerve     lower back  . Kyphoscoliosis   . Shortness of breath   . Endometriosis 1989    RIGHT TUBE  . Endometriosis 1987    LEFT TUBE/OVARY W FOCAL IN-SITU ENDOMETRIAL ADENOCARCINOMA  . Cancer 2002    DUCTAL  CIS--S/P LUMPECTOMY, RADIATION AND 6 WEEKS OF TAMOXIFEN  . Ejection fraction     EF 60%, echo, October, 2008   BP 138/46  Pulse 76  Resp 16  Ht 4\' 11"  (1.499 m)  Wt 144 lb 12.8 oz (65.681 kg)  BMI 29.25 kg/m2  SpO2 92%   Review of Systems  Musculoskeletal: Positive for back pain and gait problem.  Neurological: Positive for weakness and numbness.       Tingling  Psychiatric/Behavioral: Positive for dysphoric mood.  All other systems reviewed and are negative.       Objective:   Physical Exam Physical Exam  Symmetric normal motor tone is noted throughout. Normal muscle bulk. Muscle testing reveals 5/5 muscle strength of the upper extremity, and 5/5 of the lower extremity. Full range of motion in upper and lower extremities. ROM of spine is severely restricted.  DTR in the upper and lower extremity are present and symmetric 2+. No clonus is  noted.  Patient arises from chair with difficulty.Wide based gait with extreme kyphotic posture, almost in a 90 degree angle .  Severe thoracolumbar scoliosis.        Assessment & Plan:  This is year old female female with  1. Chronic low back pain, status post posterior fusion in the lumbar spine  2. Severe scoliosis  3. Migraines  4. CAD  5. depression  Plan :  Continue with medication, refilled pain medication today. Continue with walking as complains allow. Walking or exercising in the pool would be beneficial for the patient, but she has no access to a pool.  Follow up in 1 month.

## 2011-11-03 NOTE — Patient Instructions (Signed)
Try to stay as active as possible, continue with medications

## 2011-11-07 ENCOUNTER — Encounter (HOSPITAL_COMMUNITY)
Admission: RE | Admit: 2011-11-07 | Discharge: 2011-11-07 | Disposition: A | Discharge status: Home / Self Care | Payer: Medicare Other | Source: Ambulatory Visit | Attending: Internal Medicine | Admitting: Internal Medicine

## 2011-11-07 DIAGNOSIS — E0590 Thyrotoxicosis, unspecified without thyrotoxic crisis or storm: Secondary | ICD-10-CM | POA: Insufficient documentation

## 2011-11-08 ENCOUNTER — Encounter (HOSPITAL_COMMUNITY)
Admission: RE | Admit: 2011-11-08 | Discharge: 2011-11-08 | Disposition: A | Discharge status: Home / Self Care | Payer: Medicare Other | Source: Ambulatory Visit | Attending: Internal Medicine | Admitting: Internal Medicine

## 2011-11-08 DIAGNOSIS — E0590 Thyrotoxicosis, unspecified without thyrotoxic crisis or storm: Secondary | ICD-10-CM | POA: Insufficient documentation

## 2011-11-08 MED ORDER — SODIUM PERTECHNETATE TC 99M INJECTION
10.0000 | Freq: Once | INTRAVENOUS | Status: AC | PRN
  Administered 2011-11-08: 10 via INTRAVENOUS

## 2011-11-08 MED ORDER — SODIUM IODIDE I 131 CAPSULE: 10.0000 | Freq: Once | INTRAVENOUS | Status: AC | PRN

## 2011-11-11 ENCOUNTER — Telehealth: Payer: Self-pay

## 2011-11-11 DIAGNOSIS — E041 Nontoxic single thyroid nodule: Secondary | ICD-10-CM

## 2011-11-11 DIAGNOSIS — E0590 Thyrotoxicosis, unspecified without thyrotoxic crisis or storm: Secondary | ICD-10-CM

## 2011-11-11 NOTE — Telephone Encounter (Signed)
Message copied by Maurice Small on Fri Nov 11, 2011 12:07 PM ------      Message from: Pecola Lawless      Created: Wed Nov 09, 2011  6:02 PM       There is an overactive thyroid associated with thyroid nodules. Because of the history of coronary disease; I recommend we coordinate your care with an Endocrinologist to determine the optimal intervention/therapy. Please inform me  if you have a preference for the Endocrinologist. Alfonse Flavors

## 2011-11-17 NOTE — Telephone Encounter (Signed)
Pt left VM that she would like result of thyroid scan. .Left message to call office

## 2011-11-17 NOTE — Telephone Encounter (Signed)
I spoke with patient, patient ok with any endo

## 2011-12-01 ENCOUNTER — Encounter: Payer: Self-pay | Admitting: Endocrinology

## 2011-12-01 ENCOUNTER — Ambulatory Visit (INDEPENDENT_AMBULATORY_CARE_PROVIDER_SITE_OTHER): Payer: Medicare Other | Admitting: Endocrinology

## 2011-12-01 VITALS — BP 122/82 | HR 88 | Temp 98.5°F | Wt 144.0 lb

## 2011-12-01 DIAGNOSIS — E042 Nontoxic multinodular goiter: Secondary | ICD-10-CM | POA: Insufficient documentation

## 2011-12-01 NOTE — Patient Instructions (Addendum)
Let' check the ultrasound.  you will receive a phone call, about a day and time for an appointment   Based on the results, i hope to order for you a treatment pill of radioactive iodine.  Although it is a larger amount of radiation, you will again notice no symptoms from this.  The pill is gone from your body in a few days (during which you should stay away from other people), but takes several months to work.  Therefore, please return here approximately 6-8 weeks after the treatment.  This treatment has been available for many years, and the only known side-effect is an underactive thyroid.  It is possible that i would eventually prescribe for you a thyroid hormone pill, which is very inexpensive.  You don't have to worry about side-effects of this thyroid hormone pill, because it is the same molecule your thyroid makes.

## 2011-12-01 NOTE — Progress Notes (Signed)
Subjective:    Patient ID: Hannah Scott, female    DOB: [birth year], 61 y.o.   MRN: [id number]  HPI Pt reports few months of slight intermittent palpitations in the chest, and assoc fatigue.  She does not notice the goiter.  She was on synthroid for a few years, for mild hypothyroidism, but it was stopped more than 1 year ago.   Past Medical History  Diagnosis Date  . Asthma     PFTs, February, 2011, moderate obstructive disease with response to bronchodilators, normal lung volumes, moderate reduction in diffusing capacity  . Obstructive airway disease   . Hyperlipidemia   . Hypertension   . Hypothyroidism     Patient has had in the past that she does not need treatment  . CAD (coronary artery disease)     90% distal LAD in the past  /   nuclear, 2008, no ischemia, ejection fraction 70%  . Atrial septal aneurysm     Echo, 2008  . Pinched nerve     lower back  . Kyphoscoliosis   . Shortness of breath   . Endometriosis 1989    RIGHT TUBE  . Endometriosis 1987    LEFT TUBE/OVARY W FOCAL IN-SITU ENDOMETRIAL ADENOCARCINOMA  . Cancer 2002    DUCTAL CIS--S/P LUMPECTOMY, RADIATION AND 6 WEEKS OF TAMOXIFEN  . Ejection fraction     EF 60%, echo, October, 2008    Past Surgical History  Procedure Date  . Cataract extraction   . Cardiovascular stress test 09/07/05    Nuclear, was negative  . Breast lumpectomy 2003    radiation on right  . Appendectomy 1987    AT [facility]  . Total abdominal hysterectomy 1987    LSO, APPENDECTOMY  . Pelvic laparoscopy 1989    RSO, LYSIS OF ADHESIONS  . Oophorectomy     LSO -RSO  . Back surgery     Fusion  . Ankle surgery     History   Social History  . Marital Status: Married    Spouse Name: N/A    Number of Children: N/A  . Years of Education: N/A   Occupational History  . Not on file.   Social History Main Topics  . Smoking status: Never Smoker   . Smokeless tobacco: Never Used  . Alcohol Use: No  . Drug Use: No  . Sexually Active: No     Other Topics Concern  . Not on file   Social History Narrative  . No narrative on file    Current Outpatient Prescriptions on File Prior to Visit  Medication Sig Dispense Refill  . albuterol (PROAIR HFA) 108 (90 BASE) MCG/ACT inhaler Inhale 1-2 puffs into the lungs as needed.        [name] amLODipine (NORVASC) 2.5 MG tablet TAKE ONE TABLET BY MOUTH EVERY DAY  90 tablet  1  . aspirin 81 MG tablet Take 81 mg by mouth daily.        . beclomethasone (QVAR) 80 MCG/ACT inhaler Inhale 2 puffs into the lungs every 12 (twelve) hours. Gargle and swallow after use  8.7 Inhaler  11  . benazepril (LOTENSIN) 40 MG tablet TAKE ONE-HALF TABLET BY MOUTH EVERY DAY  45 tablet  1  . HYDROcodone-acetaminophen (NORCO) 10-325 MG per tablet Take 1 tablet by mouth every 8 (eight) hours as needed for pain.  90 tablet  0  . ipratropium-albuterol (DUONEB) 0.5-2.5 (3) MG/3ML SOLN Take 3 mLs by nebulization every 4 (four) hours as needed. Must  not be qid unless during exacerbation. Dx 493.90  360 mL  0  . LIPITOR 20 MG tablet TAKE ONE TABLET BY MOUTH EVERY DAY  30 each  10  . metoprolol tartrate (LOPRESSOR) 25 MG tablet TAKE ONE TABLET BY MOUTH TWICE DAILY  180 tablet  1  . morphine (MS CONTIN) 30 MG 12 hr tablet Take 1 tablet (30 mg total) by mouth at bedtime.  30 tablet  0  . sertraline (ZOLOFT) 50 MG tablet 1/2 day  30 tablet  2    Allergies  Allergen Reactions  . Fluticasone-Salmeterol     REACTION: headache    Family History  Problem Relation Age of Onset  . Heart attack Father   . Heart disease Father   . Heart failure Mother   . Pneumonia Mother   . Hypertension Mother   . Heart disease Mother   . Stroke Maternal Grandfather   . Hypertension Maternal Grandfather   . Asthma Paternal Uncle     PAT UNCLES  . Heart attack Paternal Uncle   mother had resection of a benign goiter  BP 122/82  Pulse 88  Temp 98.5 F (36.9 C) (Oral)  Wt 144 lb (65.318 kg)  SpO2 93%    Review of Systems denies  headache, hoarseness, double vision, palpitations, diarrhea, polyuria, myalgias, excessive diaphoresis, numbness, tremor, anxiety, hypoglycemia, and rhinorrhea She has lost 25 lbs x 1 year.  She attributes doe to asthma.  She has easy bruising. Pt reports few years of severe low-back pain, and depression.     Objective:   Physical Exam VS: see vs page GEN: no distress HEAD: head: no deformity eyes: no periorbital swelling, no proptosis external nose and ears are normal mouth: no lesion seen NECK: thyroid is 4x normal size, R>L, multinodular surface.   CHEST WALL: chronic gross deformity LUNGS:  Clear to auscultation CV: reg rate and rhythm, no murmur MUSCULOSKELETAL: muscle bulk and strength are grossly normal.  no obvious joint swelling.  gait is steady, with a walker EXTEMITIES: no deformity.  no ulcer on the feet.  feet are of normal color and temp.  no edema PULSES: dorsalis pedis intact bilat.  no carotid bruit NEURO:  cn 2-12 grossly intact.   readily moves all 4's.  sensation is intact to touch on the feet.  No tremor. SKIN:  Normal texture and temperature.  No rash or suspicious lesion is visible.  Not diaphoretic. NODES:  None palpable at the neck.   PSYCH: alert, oriented x3.  Does not appear anxious nor depressed.  Lab Results  Component Value Date   TSH 0.02* 10/20/2011      Assessment & Plan:  Hyperthyroidism, due to multinodular goiter, new H/o hypothyroidism, usually due to chronic thyroiditis.  Therefore, she probable has 2 diseases.  She would therefore need a baseline ultrasound Weight-loss, possibly due to hyperthyroidism

## 2011-12-06 ENCOUNTER — Ambulatory Visit
Admission: RE | Admit: 2011-12-06 | Discharge: 2011-12-06 | Disposition: A | Discharge status: Home / Self Care | Payer: Medicare Other | Source: Ambulatory Visit | Attending: Endocrinology | Admitting: Endocrinology

## 2011-12-06 DIAGNOSIS — E042 Nontoxic multinodular goiter: Secondary | ICD-10-CM

## 2011-12-09 ENCOUNTER — Encounter
Payer: Medicare Other | Attending: Physical Medicine and Rehabilitation | Admitting: Physical Medicine and Rehabilitation

## 2011-12-09 ENCOUNTER — Encounter: Payer: Self-pay | Admitting: Physical Medicine and Rehabilitation

## 2011-12-09 VITALS — BP 141/68 | HR 87 | Resp 14 | Ht 59.0 in | Wt 143.0 lb

## 2011-12-09 DIAGNOSIS — R209 Unspecified disturbances of skin sensation: Secondary | ICD-10-CM | POA: Insufficient documentation

## 2011-12-09 DIAGNOSIS — G8929 Other chronic pain: Secondary | ICD-10-CM | POA: Insufficient documentation

## 2011-12-09 DIAGNOSIS — F3289 Other specified depressive episodes: Secondary | ICD-10-CM | POA: Insufficient documentation

## 2011-12-09 DIAGNOSIS — M545 Low back pain, unspecified: Secondary | ICD-10-CM | POA: Insufficient documentation

## 2011-12-09 DIAGNOSIS — Z981 Arthrodesis status: Secondary | ICD-10-CM | POA: Insufficient documentation

## 2011-12-09 DIAGNOSIS — G43909 Migraine, unspecified, not intractable, without status migrainosus: Secondary | ICD-10-CM | POA: Insufficient documentation

## 2011-12-09 DIAGNOSIS — F329 Major depressive disorder, single episode, unspecified: Secondary | ICD-10-CM | POA: Insufficient documentation

## 2011-12-09 DIAGNOSIS — M419 Scoliosis, unspecified: Secondary | ICD-10-CM

## 2011-12-09 DIAGNOSIS — I2510 Atherosclerotic heart disease of native coronary artery without angina pectoris: Secondary | ICD-10-CM | POA: Insufficient documentation

## 2011-12-09 DIAGNOSIS — M961 Postlaminectomy syndrome, not elsewhere classified: Secondary | ICD-10-CM

## 2011-12-09 DIAGNOSIS — M4120 Other idiopathic scoliosis, site unspecified: Secondary | ICD-10-CM

## 2011-12-09 MED ORDER — MORPHINE SULFATE ER 30 MG PO TBCR
30.0000 mg | EXTENDED_RELEASE_TABLET | Freq: Every day | ORAL | Status: DC
Start: 2011-12-09 — End: 2012-01-06

## 2011-12-09 MED ORDER — HYDROCODONE-ACETAMINOPHEN 10-325 MG PO TABS: 1.0000 | ORAL_TABLET | Freq: Three times a day (TID) | ORAL | Status: DC | PRN

## 2011-12-09 NOTE — Patient Instructions (Signed)
Try to stay as active as possible 

## 2011-12-09 NOTE — Progress Notes (Signed)
Subjective:    Patient ID: Hannah Scott, female    DOB: October 12, 1950, 61 y.o.   MRN: [id number]  HPI The patient complains about chronic low back pain pain which radiates into right LE. The patient also complains about numbness and tingling, intermittendly After prolonged standing.  The problem has been stable .  Pain Inventory Average Pain 7 Pain Right Now 6 My pain is constant, burning, tingling and aching  In the last 24 hours, has pain interfered with the following? General activity 10 Relation with others 10 Enjoyment of life 10 What TIME of day is your pain at its worst? day, evening and night Sleep (in general) Fair  Pain is worse with: walking and standing Pain improves with: rest and medication Relief from Meds: 5  Mobility use a cane ability to climb steps?  yes do you drive?  yes  Function disabled: date disabled 2008 I need assistance with the following:  meal prep, household duties and shopping  Neuro/Psych weakness numbness tingling trouble walking depression  Prior Studies Any changes since last visit?  no  Physicians involved in your care Any changes since last visit?  no   Family History  Problem Relation Age of Onset  . Heart attack Father   . Heart disease Father   . Heart failure Mother   . Pneumonia Mother   . Hypertension Mother   . Heart disease Mother   . Stroke Maternal Grandfather   . Hypertension Maternal Grandfather   . Asthma Paternal Uncle     PAT UNCLES  . Heart attack Paternal Uncle    History   Social History  . Marital Status: Married    Spouse Name: N/A    Number of Children: N/A  . Years of Education: N/A   Social History Main Topics  . Smoking status: Never Smoker   . Smokeless tobacco: Never Used  . Alcohol Use: No  . Drug Use: No  . Sexually Active: No   Other Topics Concern  . None   Social History Narrative  . None   Past Surgical History  Procedure Date  . Cataract extraction   . Cardiovascular  stress test 09/07/05    Nuclear, was negative  . Breast lumpectomy 2003    radiation on right  . Appendectomy 1987    AT [facility]  . Total abdominal hysterectomy 1987    LSO, APPENDECTOMY  . Pelvic laparoscopy 1989    RSO, LYSIS OF ADHESIONS  . Oophorectomy     LSO -RSO  . Back surgery     Fusion  . Ankle surgery    Past Medical History  Diagnosis Date  . Asthma     PFTs, February, 2011, moderate obstructive disease with response to bronchodilators, normal lung volumes, moderate reduction in diffusing capacity  . Obstructive airway disease   . Hyperlipidemia   . Hypertension   . Hypothyroidism     Patient has had in the past that she does not need treatment  . CAD (coronary artery disease)     90% distal LAD in the past  /   nuclear, 2008, no ischemia, ejection fraction 70%  . Atrial septal aneurysm     Echo, 2008  . Pinched nerve     lower back  . Kyphoscoliosis   . Shortness of breath   . Endometriosis 1989    RIGHT TUBE  . Endometriosis 1987    LEFT TUBE/OVARY W FOCAL IN-SITU ENDOMETRIAL ADENOCARCINOMA  . Cancer 2002  DUCTAL CIS--S/P LUMPECTOMY, RADIATION AND 6 WEEKS OF TAMOXIFEN  . Ejection fraction     EF 60%, echo, October, 2008   BP 141/68  Pulse 87  Resp 14  Ht 4\' 11"  (1.499 m)  Wt 143 lb (64.864 kg)  BMI 28.88 kg/m2  SpO2 96%     Review of Systems  Musculoskeletal: Positive for myalgias, back pain and arthralgias.  Neurological: Positive for weakness and numbness.  Psychiatric/Behavioral: Positive for dysphoric mood.  All other systems reviewed and are negative.       Objective:   Physical Exam  Constitutional: She is oriented to person, place, and time. She appears well-developed and well-nourished.  HENT:  Head: Normocephalic.  Musculoskeletal: She exhibits tenderness.  Neurological: She is alert and oriented to person, place, and time.  Skin: Skin is warm and dry.  Psychiatric: She has a normal mood and affect.    Symmetric normal motor  tone is noted throughout. Normal muscle bulk. Muscle testing reveals 5/5 muscle strength of the upper extremity, and 5/5 of the lower extremity. Full range of motion in upper and lower extremities. ROM of spine is severely restricted.  DTR in the upper and lower extremity are present and symmetric 2+. No clonus is noted.  Patient arises from chair with difficulty.Wide based gait with extreme kyphotic posture, almost in a 90 degree angle .  Severe thoracolumbar scoliosis.        Assessment & Plan:  This is year old female female with  1. Chronic low back pain, status post posterior fusion in the lumbar spine  2. Severe scoliosis  3. Migraines  4. CAD  5. depression  Plan :  Continue with medication, refilled pain medication today. Continue with walking as complains allow. Walking or exercising in the pool would be beneficial for the patient, but she has no access to a pool[place]  Follow up in 1 month.

## 2011-12-20 ENCOUNTER — Telehealth: Payer: Self-pay

## 2011-12-20 DIAGNOSIS — E0590 Thyrotoxicosis, unspecified without thyrotoxic crisis or storm: Secondary | ICD-10-CM

## 2011-12-20 NOTE — Telephone Encounter (Signed)
i ordered Ret 6-8 weeks later

## 2011-12-20 NOTE — Telephone Encounter (Signed)
Pt informed of referral

## 2011-12-20 NOTE — Telephone Encounter (Signed)
Pt called to inform MD that she has decided to proceed with radioactive iodine treatment.

## 2011-12-28 ENCOUNTER — Ambulatory Visit: Payer: Medicare Other | Admitting: Cardiology

## 2011-12-30 ENCOUNTER — Encounter (HOSPITAL_COMMUNITY)
Admission: RE | Admit: 2011-12-30 | Discharge: 2011-12-30 | Disposition: A | Discharge status: Home / Self Care | Payer: Medicare Other | Source: Ambulatory Visit | Attending: Endocrinology | Admitting: Endocrinology

## 2011-12-30 DIAGNOSIS — E0590 Thyrotoxicosis, unspecified without thyrotoxic crisis or storm: Secondary | ICD-10-CM

## 2011-12-30 DIAGNOSIS — E0520 Thyrotoxicosis with toxic multinodular goiter without thyrotoxic crisis or storm: Secondary | ICD-10-CM | POA: Insufficient documentation

## 2011-12-30 MED ORDER — SODIUM IODIDE I 131 CAPSULE
32.0000 | Freq: Once | INTRAVENOUS | Status: AC | PRN
  Administered 2011-12-30: 32 via ORAL

## 2012-01-02 ENCOUNTER — Ambulatory Visit: Payer: Medicare Other | Admitting: Cardiology

## 2012-01-06 ENCOUNTER — Encounter
Payer: Medicare Other | Attending: Physical Medicine and Rehabilitation | Admitting: Physical Medicine and Rehabilitation

## 2012-01-06 ENCOUNTER — Encounter: Payer: Self-pay | Admitting: Physical Medicine and Rehabilitation

## 2012-01-06 VITALS — BP 162/89 | HR 78 | Resp 12 | Wt 146.0 lb

## 2012-01-06 DIAGNOSIS — M545 Low back pain, unspecified: Secondary | ICD-10-CM | POA: Insufficient documentation

## 2012-01-06 DIAGNOSIS — M4120 Other idiopathic scoliosis, site unspecified: Secondary | ICD-10-CM

## 2012-01-06 DIAGNOSIS — J45909 Unspecified asthma, uncomplicated: Secondary | ICD-10-CM | POA: Insufficient documentation

## 2012-01-06 DIAGNOSIS — I2510 Atherosclerotic heart disease of native coronary artery without angina pectoris: Secondary | ICD-10-CM | POA: Insufficient documentation

## 2012-01-06 DIAGNOSIS — F329 Major depressive disorder, single episode, unspecified: Secondary | ICD-10-CM | POA: Insufficient documentation

## 2012-01-06 DIAGNOSIS — E039 Hypothyroidism, unspecified: Secondary | ICD-10-CM | POA: Insufficient documentation

## 2012-01-06 DIAGNOSIS — Z981 Arthrodesis status: Secondary | ICD-10-CM | POA: Insufficient documentation

## 2012-01-06 DIAGNOSIS — G8928 Other chronic postprocedural pain: Secondary | ICD-10-CM | POA: Insufficient documentation

## 2012-01-06 DIAGNOSIS — G43909 Migraine, unspecified, not intractable, without status migrainosus: Secondary | ICD-10-CM | POA: Insufficient documentation

## 2012-01-06 DIAGNOSIS — M419 Scoliosis, unspecified: Secondary | ICD-10-CM

## 2012-01-06 DIAGNOSIS — R209 Unspecified disturbances of skin sensation: Secondary | ICD-10-CM | POA: Insufficient documentation

## 2012-01-06 DIAGNOSIS — I10 Essential (primary) hypertension: Secondary | ICD-10-CM | POA: Insufficient documentation

## 2012-01-06 DIAGNOSIS — F3289 Other specified depressive episodes: Secondary | ICD-10-CM | POA: Insufficient documentation

## 2012-01-06 DIAGNOSIS — E785 Hyperlipidemia, unspecified: Secondary | ICD-10-CM | POA: Insufficient documentation

## 2012-01-06 MED ORDER — MORPHINE SULFATE ER 30 MG PO TBCR: 30.0000 mg | EXTENDED_RELEASE_TABLET | Freq: Every day | ORAL | Status: DC

## 2012-01-06 MED ORDER — HYDROCODONE-ACETAMINOPHEN 10-325 MG PO TABS: 1.0000 | ORAL_TABLET | Freq: Three times a day (TID) | ORAL | Status: DC | PRN

## 2012-01-06 NOTE — Patient Instructions (Signed)
Try to be as active as tolerated. 

## 2012-01-06 NOTE — Progress Notes (Signed)
Subjective:    Patient ID: Hannah Scott, female    DOB: 04/04/1951, 61 y.o.   MRN: [id number]  HPI The patient complains about chronic low back pain pain which radiates into right LE. The patient also complains about numbness and tingling, intermittendly after prolonged standing.  The problem has been stable . Hx of PSF, Hx of severe scoliosis, is not considering further surgery at this point.  Pain Inventory Average Pain 7 Pain Right Now 6 My pain is burning, tingling and aching  In the last 24 hours, has pain interfered with the following? General activity 10 Relation with others 10 Enjoyment of life 10 What TIME of day is your pain at its worst? all of the time Sleep (in general) Poor  Pain is worse with: walking and standing Pain improves with: rest and medication Relief from Meds: 6  Mobility use a cane ability to climb steps?  yes do you drive?  yes  Function disabled: date disabled 2008 I need assistance with the following:  meal prep, household duties and shopping  Neuro/Psych weakness numbness tingling depression  Prior Studies Any changes since last visit?  no  Physicians involved in your care Any changes since last visit?  no   Family History  Problem Relation Age of Onset  . Heart attack Father   . Heart disease Father   . Heart failure Mother   . Pneumonia Mother   . Hypertension Mother   . Heart disease Mother   . Stroke Maternal Grandfather   . Hypertension Maternal Grandfather   . Asthma Paternal Uncle     PAT UNCLES  . Heart attack Paternal Uncle    History   Social History  . Marital Status: Married    Spouse Name: N/A    Number of Children: N/A  . Years of Education: N/A   Social History Main Topics  . Smoking status: Never Smoker   . Smokeless tobacco: Never Used  . Alcohol Use: No  . Drug Use: No  . Sexually Active: No   Other Topics Concern  . None   Social History Narrative  . None   Past Surgical History    Procedure Date  . Cataract extraction   . Cardiovascular stress test 09/07/05    Nuclear, was negative  . Breast lumpectomy 2003    radiation on right  . Appendectomy 1987    AT [facility]  . Total abdominal hysterectomy 1987    LSO, APPENDECTOMY  . Pelvic laparoscopy 1989    RSO, LYSIS OF ADHESIONS  . Oophorectomy     LSO -RSO  . Back surgery     Fusion  . Ankle surgery    Past Medical History  Diagnosis Date  . Asthma     PFTs, February, 2011, moderate obstructive disease with response to bronchodilators, normal lung volumes, moderate reduction in diffusing capacity  . Obstructive airway disease   . Hyperlipidemia   . Hypertension   . Hypothyroidism     Patient has had in the past that she does not need treatment  . CAD (coronary artery disease)     90% distal LAD in the past  /   nuclear, 2008, no ischemia, ejection fraction 70%  . Atrial septal aneurysm     Echo, 2008  . Pinched nerve     lower back  . Kyphoscoliosis   . Shortness of breath   . Endometriosis 1989    RIGHT TUBE  . Endometriosis 1987    LEFT  TUBE/OVARY W FOCAL IN-SITU ENDOMETRIAL ADENOCARCINOMA  . Cancer 2002    DUCTAL CIS--S/P LUMPECTOMY, RADIATION AND 6 WEEKS OF TAMOXIFEN  . Ejection fraction     EF 60%, echo, October, 2008   BP 162/89  Pulse 78  Resp 12  Wt 146 lb (66.225 kg)  SpO2 98%    Review of Systems  Musculoskeletal: Positive for back pain and gait problem.  Neurological: Positive for weakness and numbness.       Tingling  Psychiatric/Behavioral: Positive for dysphoric mood.  All other systems reviewed and are negative.       Objective:   Physical Exam Constitutional: She is oriented to person, place, and time. She appears well-developed and well-nourished.  HENT:  Head: Normocephalic.  Musculoskeletal: She exhibits tenderness.  Neurological: She is alert and oriented to person, place, and time.  Skin: Skin is warm and dry.  Psychiatric: She has a normal mood and affect.    Symmetric normal motor tone is noted throughout. Normal muscle bulk. Muscle testing reveals 5/5 muscle strength of the upper extremity, and 5/5 of the lower extremity. Full range of motion in upper and lower extremities. ROM of spine is severely restricted.  DTR in the upper and lower extremity are present and symmetric 2+. No clonus is noted.  Patient arises from chair with difficulty.Wide based gait with extreme kyphotic posture, almost in a 90 degree angle of her upper body .  Severe thoracolumbar scoliosis.        Assessment & Plan:  This is a 61 year old female with  1. Chronic low back pain, status post posterior fusion in the lumbar spine  2. Severe scoliosis  3. Migraines  4. CAD  5. depression  Plan :  Continue with medication, refilled pain medication today. Continue with walking as complains allow. Walking or exercising in the pool would be beneficial for the patient, but she has no access to a pool. [place] Wanted to prescribe aquatic PT, but patient denies.  Suggested seeing a pain psychologist to help her to cope with her situation, but patient denies. Follow up in 1 month.

## 2012-02-08 ENCOUNTER — Encounter
Payer: Medicare Other | Attending: Physical Medicine and Rehabilitation | Admitting: Physical Medicine and Rehabilitation

## 2012-02-08 ENCOUNTER — Encounter: Payer: Self-pay | Admitting: Physical Medicine and Rehabilitation

## 2012-02-08 VITALS — BP 132/49 | HR 88 | Resp 14 | Ht 62.0 in | Wt 147.0 lb

## 2012-02-08 DIAGNOSIS — M4120 Other idiopathic scoliosis, site unspecified: Secondary | ICD-10-CM | POA: Insufficient documentation

## 2012-02-08 DIAGNOSIS — E785 Hyperlipidemia, unspecified: Secondary | ICD-10-CM | POA: Insufficient documentation

## 2012-02-08 DIAGNOSIS — M545 Low back pain, unspecified: Secondary | ICD-10-CM | POA: Insufficient documentation

## 2012-02-08 DIAGNOSIS — Z981 Arthrodesis status: Secondary | ICD-10-CM | POA: Insufficient documentation

## 2012-02-08 DIAGNOSIS — I10 Essential (primary) hypertension: Secondary | ICD-10-CM | POA: Insufficient documentation

## 2012-02-08 DIAGNOSIS — G8929 Other chronic pain: Secondary | ICD-10-CM | POA: Insufficient documentation

## 2012-02-08 DIAGNOSIS — M419 Scoliosis, unspecified: Secondary | ICD-10-CM

## 2012-02-08 DIAGNOSIS — M963 Postlaminectomy kyphosis: Secondary | ICD-10-CM

## 2012-02-08 DIAGNOSIS — I2510 Atherosclerotic heart disease of native coronary artery without angina pectoris: Secondary | ICD-10-CM | POA: Insufficient documentation

## 2012-02-08 DIAGNOSIS — E039 Hypothyroidism, unspecified: Secondary | ICD-10-CM | POA: Insufficient documentation

## 2012-02-08 DIAGNOSIS — F3289 Other specified depressive episodes: Secondary | ICD-10-CM | POA: Insufficient documentation

## 2012-02-08 DIAGNOSIS — M961 Postlaminectomy syndrome, not elsewhere classified: Secondary | ICD-10-CM

## 2012-02-08 DIAGNOSIS — G43909 Migraine, unspecified, not intractable, without status migrainosus: Secondary | ICD-10-CM | POA: Insufficient documentation

## 2012-02-08 DIAGNOSIS — F329 Major depressive disorder, single episode, unspecified: Secondary | ICD-10-CM | POA: Insufficient documentation

## 2012-02-08 DIAGNOSIS — R209 Unspecified disturbances of skin sensation: Secondary | ICD-10-CM | POA: Insufficient documentation

## 2012-02-08 MED ORDER — HYDROCODONE-ACETAMINOPHEN 10-325 MG PO TABS: 1.0000 | ORAL_TABLET | Freq: Three times a day (TID) | ORAL | Status: DC | PRN

## 2012-02-08 MED ORDER — MORPHINE SULFATE ER 30 MG PO TBCR: 30.0000 mg | EXTENDED_RELEASE_TABLET | Freq: Every day | ORAL | Status: DC

## 2012-02-08 NOTE — Progress Notes (Signed)
Subjective:    Patient ID: Hannah Scott, female    DOB: 04/14/1951, 61 y.o.   MRN: [id number]  HPI The patient complains about chronic low back pain pain which radiates into right LE. The patient also complains about numbness and tingling, intermittendly after prolonged standing.  The problem has been stable . Hx of PSF, Hx of severe scoliosis, is not considering further surgery at this point.  Pain Inventory Average Pain 8 Pain Right Now 7 My pain is burning, tingling and aching  In the last 24 hours, has pain interfered with the following? General activity 10 Relation with others 10 Enjoyment of life 10 What TIME of day is your pain at its worst? All Day Sleep (in general) Poor  Pain is worse with: walking and standing Pain improves with: rest and medication Relief from Meds: 4  Mobility use a cane ability to climb steps?  yes do you drive?  yes  Function disabled: date disabled 2003 I need assistance with the following:  meal prep, household duties and shopping  Neuro/Psych weakness numbness tingling  Prior Studies Any changes since last visit?  no  Physicians involved in your care Any changes since last visit?  no   Family History  Problem Relation Age of Onset  . Heart attack Father   . Heart disease Father   . Heart failure Mother   . Pneumonia Mother   . Hypertension Mother   . Heart disease Mother   . Stroke Maternal Grandfather   . Hypertension Maternal Grandfather   . Asthma Paternal Uncle     PAT UNCLES  . Heart attack Paternal Uncle    History   Social History  . Marital Status: Married    Spouse Name: N/A    Number of Children: N/A  . Years of Education: N/A   Social History Main Topics  . Smoking status: Never Smoker   . Smokeless tobacco: Never Used  . Alcohol Use: No  . Drug Use: No  . Sexually Active: No   Other Topics Concern  . None   Social History Narrative  . None   Past Surgical History  Procedure Date  . Cataract  extraction   . Cardiovascular stress test 09/07/05    Nuclear, was negative  . Breast lumpectomy 2003    radiation on right  . Appendectomy 1987    AT [facility]  . Total abdominal hysterectomy 1987    LSO, APPENDECTOMY  . Pelvic laparoscopy 1989    RSO, LYSIS OF ADHESIONS  . Oophorectomy     LSO -RSO  . Back surgery     Fusion  . Ankle surgery    Past Medical History  Diagnosis Date  . Asthma     PFTs, February, 2011, moderate obstructive disease with response to bronchodilators, normal lung volumes, moderate reduction in diffusing capacity  . Obstructive airway disease   . Hyperlipidemia   . Hypertension   . Hypothyroidism     Patient has had in the past that she does not need treatment  . CAD (coronary artery disease)     90% distal LAD in the past  /   nuclear, 2008, no ischemia, ejection fraction 70%  . Atrial septal aneurysm     Echo, 2008  . Pinched nerve     lower back  . Kyphoscoliosis   . Shortness of breath   . Endometriosis 1989    RIGHT TUBE  . Endometriosis 1987    LEFT TUBE/OVARY W FOCAL IN-SITU  ENDOMETRIAL ADENOCARCINOMA  . Cancer 2002    DUCTAL CIS--S/P LUMPECTOMY, RADIATION AND 6 WEEKS OF TAMOXIFEN  . Ejection fraction     EF 60%, echo, October, 2008   BP 132/49  Pulse 88  Resp 14  Ht 5\' 2"  (1.575 m)  Wt 147 lb (66.679 kg)  BMI 26.89 kg/m2  SpO2 94%      Review of Systems  HENT: Negative.   Eyes: Negative.   Respiratory: Negative.   Cardiovascular: Negative.   Gastrointestinal: Negative.   Genitourinary: Negative.   Musculoskeletal: Positive for myalgias, back pain and arthralgias.  Skin: Negative.   Neurological: Positive for weakness and numbness.  Hematological: Negative.   Psychiatric/Behavioral: Negative.        Objective:   Physical Exam Constitutional: She is oriented to person, place, and time. She appears well-developed and well-nourished.  HENT:  Head: Normocephalic.  Musculoskeletal: She exhibits tenderness.    Neurological: She is alert and oriented to person, place, and time.  Skin: Skin is warm and dry.  Psychiatric: She has a normal mood and affect.  Symmetric normal motor tone is noted throughout. Normal muscle bulk. Muscle testing reveals 5/5 muscle strength of the upper extremity, and 5/5 of the lower extremity. Full range of motion in upper and lower extremities. ROM of spine is severely restricted.  DTR in the upper and lower extremity are present and symmetric 2+. No clonus is noted.  Patient arises from chair with difficulty.Wide based gait with extreme kyphotic posture, almost in a 90 degree angle of her upper body .  Severe thoracolumbar scoliosis.        Assessment & Plan:  This is a 61 year old female with  1. Chronic low back pain, status post posterior fusion in the lumbar spine  2. Severe scoliosis  3. Migraines  4. CAD  5. depression  Plan :  Continue with medication, refilled pain medication today. Continue with walking as complains allow. Walking or exercising in the pool would be beneficial for the patient, but she has no access to a pool. [place] Wanted to prescribe aquatic PT, but patient denies. Suggested seeing a pain psychologist to help her to cope with her situation, but patient denies.  Follow up in 1 month.

## 2012-02-08 NOTE — Patient Instructions (Signed)
Try to stay as active as pain allows. 

## 2012-02-15 ENCOUNTER — Ambulatory Visit (INDEPENDENT_AMBULATORY_CARE_PROVIDER_SITE_OTHER): Payer: Medicare Other | Admitting: Endocrinology

## 2012-02-15 ENCOUNTER — Encounter: Payer: Self-pay | Admitting: Endocrinology

## 2012-02-15 VITALS — BP 130/84 | HR 60 | Temp 98.7°F | Resp 16 | Wt 149.1 lb

## 2012-02-15 DIAGNOSIS — Z23 Encounter for immunization: Secondary | ICD-10-CM

## 2012-02-15 DIAGNOSIS — E0590 Thyrotoxicosis, unspecified without thyrotoxic crisis or storm: Secondary | ICD-10-CM

## 2012-02-15 NOTE — Progress Notes (Signed)
Subjective:    Patient ID: Hannah Scott, female    DOB: 1950/12/19, 61 y.o.   MRN: [id number]  HPI Pt is 7 weeks s/p i-131 rx for hyperthyroidism, due to multinodular goiter.  She still does not feel well.  She has intermittent crying spells.  Past Medical History  Diagnosis Date  . Asthma     PFTs, February, 2011, moderate obstructive disease with response to bronchodilators, normal lung volumes, moderate reduction in diffusing capacity  . Obstructive airway disease   . Hyperlipidemia   . Hypertension   . Hypothyroidism     Patient has had in the past that she does not need treatment  . CAD (coronary artery disease)     90% distal LAD in the past  /   nuclear, 2008, no ischemia, ejection fraction 70%  . Atrial septal aneurysm     Echo, 2008  . Pinched nerve     lower back  . Kyphoscoliosis   . Shortness of breath   . Endometriosis 1989    RIGHT TUBE  . Endometriosis 1987    LEFT TUBE/OVARY W FOCAL IN-SITU ENDOMETRIAL ADENOCARCINOMA  . Cancer 2002    DUCTAL CIS--S/P LUMPECTOMY, RADIATION AND 6 WEEKS OF TAMOXIFEN  . Ejection fraction     EF 60%, echo, October, 2008    Past Surgical History  Procedure Date  . Cataract extraction   . Cardiovascular stress test 09/07/05    Nuclear, was negative  . Breast lumpectomy 2003    radiation on right  . Appendectomy 1987    AT [facility]  . Total abdominal hysterectomy 1987    LSO, APPENDECTOMY  . Pelvic laparoscopy 1989    RSO, LYSIS OF ADHESIONS  . Oophorectomy     LSO -RSO  . Back surgery     Fusion  . Ankle surgery     History   Social History  . Marital Status: Married    Spouse Name: N/A    Number of Children: N/A  . Years of Education: N/A   Occupational History  . Not on file.   Social History Main Topics  . Smoking status: Never Smoker   . Smokeless tobacco: Never Used  . Alcohol Use: No  . Drug Use: No  . Sexually Active: No   Other Topics Concern  . Not on file   Social History Narrative  . No narrative  on file    Current Outpatient Prescriptions on File Prior to Visit  Medication Sig Dispense Refill  . albuterol (PROAIR HFA) 108 (90 BASE) MCG/ACT inhaler Inhale 1-2 puffs into the lungs as needed.        [name] amLODipine (NORVASC) 2.5 MG tablet TAKE ONE TABLET BY MOUTH EVERY DAY  90 tablet  1  . aspirin 81 MG tablet Take 81 mg by mouth daily.        . beclomethasone (QVAR) 80 MCG/ACT inhaler Inhale 2 puffs into the lungs every 12 (twelve) hours. Gargle and swallow after use  8.7 Inhaler  11  . benazepril (LOTENSIN) 40 MG tablet TAKE ONE-HALF TABLET BY MOUTH EVERY DAY  45 tablet  1  . HYDROcodone-acetaminophen (NORCO) 10-325 MG per tablet Take 1 tablet by mouth every 8 (eight) hours as needed for pain.  90 tablet  0  . ipratropium-albuterol (DUONEB) 0.5-2.5 (3) MG/3ML SOLN Take 3 mLs by nebulization every 4 (four) hours as needed. Must not be qid unless during exacerbation. Dx 493.90  360 mL  0  . LIPITOR 20 MG  tablet TAKE ONE TABLET BY MOUTH EVERY DAY  30 each  10  . metoprolol tartrate (LOPRESSOR) 25 MG tablet TAKE ONE TABLET BY MOUTH TWICE DAILY  180 tablet  1  . morphine (MS CONTIN) 30 MG 12 hr tablet Take 1 tablet (30 mg total) by mouth at bedtime.  30 tablet  0  . sertraline (ZOLOFT) 50 MG tablet 1/2 day  30 tablet  2    Allergies  Allergen Reactions  . Fluticasone-Salmeterol     REACTION: headache    Family History  Problem Relation Age of Onset  . Heart attack Father   . Heart disease Father   . Heart failure Mother   . Pneumonia Mother   . Hypertension Mother   . Heart disease Mother   . Stroke Maternal Grandfather   . Hypertension Maternal Grandfather   . Asthma Paternal Uncle     PAT UNCLES  . Heart attack Paternal Uncle     BP 130/84  Pulse 60  Temp 98.7 F (37.1 C) (Oral)  Resp 16  Wt 149 lb 1 oz (67.614 kg)  SpO2 96%  Review of Systems Denies fever and weight change.      Objective:   Physical Exam VITAL SIGNS:  See vs page GENERAL: no distress Neck:  thyroid is 4x normal size, (R>L), with multinodular surface.  Lab Results  Component Value Date   TSH 2.632 02/15/2012      Assessment & Plan:  Hyperthyroidism, better after i-131 rx

## 2012-02-15 NOTE — Patient Instructions (Addendum)
blood tests are being requested for you today.  You will be contacted with results. Please come back for a follow-up appointment in 4-6 weeks

## 2012-02-16 LAB — TSH: TSH: 2.632 u[IU]/mL (ref 0.350–4.500)

## 2012-02-29 ENCOUNTER — Ambulatory Visit (INDEPENDENT_AMBULATORY_CARE_PROVIDER_SITE_OTHER): Payer: Medicare Other | Admitting: Internal Medicine

## 2012-02-29 ENCOUNTER — Encounter: Payer: Self-pay | Admitting: Internal Medicine

## 2012-02-29 VITALS — BP 140/86 | HR 70 | Temp 98.1°F | Wt 152.0 lb

## 2012-02-29 DIAGNOSIS — J45909 Unspecified asthma, uncomplicated: Secondary | ICD-10-CM

## 2012-02-29 DIAGNOSIS — J441 Chronic obstructive pulmonary disease with (acute) exacerbation: Secondary | ICD-10-CM

## 2012-02-29 MED ORDER — PREDNISONE 10 MG PO TABS: ORAL_TABLET | ORAL | Status: DC

## 2012-02-29 MED ORDER — ALBUTEROL SULFATE HFA 108 (90 BASE) MCG/ACT IN AERS: 1.0000 | INHALATION_SPRAY | Freq: Four times a day (QID) | RESPIRATORY_TRACT | Status: DC | PRN

## 2012-02-29 MED ORDER — BECLOMETHASONE DIPROPIONATE 80 MCG/ACT IN AERS: 2.0000 | INHALATION_SPRAY | Freq: Two times a day (BID) | RESPIRATORY_TRACT | Status: DC

## 2012-02-29 MED ORDER — AZITHROMYCIN 250 MG PO TABS: ORAL_TABLET | ORAL | Status: DC

## 2012-02-29 NOTE — Patient Instructions (Addendum)
Rest, fluids , tylenol For cough, take Mucinex DM twice a day as needed  For wheezing: nebulizations every 6 hours x 2 days , then as needed prednisone as prescribed   Take the antibiotic as prescribed  (zpack) Call if no better in few days Call anytime if the symptoms are severe

## 2012-02-29 NOTE — Progress Notes (Signed)
  Subjective:    Patient ID: Hannah Scott, female    DOB: [birth year], 61 y.o.   MRN: [id number]  HPI 61 year old lady with history of asthma, CAD with several days history of postnasal dripping and increasing wheezing. Initially she was only taking inhalers, now is using nebulizations that help temporarily. Good compliance with medications. In the past, her symptoms have been well-controlled, she reports usually 2 exacerbations a year with current regimen  Past Medical History  Diagnosis Date  . Asthma     PFTs, February, 2011, moderate obstructive disease with response to bronchodilators, normal lung volumes, moderate reduction in diffusing capacity  . Obstructive airway disease   . Hyperlipidemia   . Hypertension   . Hypothyroidism     Patient has had in the past that she does not need treatment  . CAD (coronary artery disease)     90% distal LAD in the past  /   nuclear, 2008, no ischemia, ejection fraction 70%  . Atrial septal aneurysm     Echo, 2008  . Pinched nerve     lower back  . Kyphoscoliosis   . Shortness of breath   . Endometriosis 1989    RIGHT TUBE  . Endometriosis 1987    LEFT TUBE/OVARY W FOCAL IN-SITU ENDOMETRIAL ADENOCARCINOMA  . Cancer 2002    DUCTAL CIS--S/P LUMPECTOMY, RADIATION AND 6 WEEKS OF TAMOXIFEN  . Ejection fraction     EF 60%, echo, October, 2008   Past Surgical History  Procedure Date  . Cataract extraction   . Cardiovascular stress test 09/07/05    Nuclear, was negative  . Breast lumpectomy 2003    radiation on right  . Appendectomy 1987    AT [facility]  . Total abdominal hysterectomy 1987    LSO, APPENDECTOMY  . Pelvic laparoscopy 1989    RSO, LYSIS OF ADHESIONS  . Oophorectomy     LSO -RSO  . Back surgery     Fusion  . Ankle surgery    History   Social History  . Marital Status: Married    Spouse Name: N/A    Number of Children: N/A  . Years of Education: N/A   Occupational History  . Not on file.   Social History Main Topics    . Smoking status: Never Smoker   . Smokeless tobacco: Never Used  . Alcohol Use: No  . Drug Use: No  . Sexually Active: No   Other Topics Concern  . Not on file   Social History Narrative  . No narrative on file    Review of Systems No fever or chills Some cough with small amount of clear sputum. She is more short of breath than baseline but symptoms are not severe. Denies any chest pain, lower extremity edema. She had a flu shot 2 weeks ago.     Objective:   Physical Exam General -- alert, . No apparent distress.  HEENT -- TMs normal, throat w/o redness, face symmetric and not tender to palpation, nose not congested EOMI, PERRLA Lungs --  mild wheezing bilaterally, no rales or rhonchi. No increased work of breathing Heart-- normal rate, regular rhythm, no murmur, and no gallop.    Neurologic-- alert & oriented X3 and strength normal in all extremities. Psych-- Cognition and judgment appear intact. Alert and cooperative with normal attention span and concentration.  not anxious appearing and not depressed appearing.      Assessment & Plan:

## 2012-02-29 NOTE — Assessment & Plan Note (Addendum)
Presents with asthma exacerbation. The patient states that she usually gets better with a round of prednisone Plan: Prednisone, Z-Pak, see instructions. Refill Qvar She needs also a new nebulizer, her nebulizer is several years old. Will ask one of the home health agencies to deliver one to her house.

## 2012-03-01 ENCOUNTER — Other Ambulatory Visit: Payer: Self-pay

## 2012-03-01 MED ORDER — ALBUTEROL SULFATE (2.5 MG/3ML) 0.083% IN NEBU: INHALATION_SOLUTION | RESPIRATORY_TRACT | Status: DC

## 2012-03-01 NOTE — Addendum Note (Signed)
Addended by: Edwena Felty T on: 03/01/2012 10:21 AM   Modules accepted: Orders

## 2012-03-01 NOTE — Telephone Encounter (Signed)
Spoke with patient's husband, informed him rx for nebulized solution sent to local pharmacy. I also spoke with [facility] to verify rx for nebulizer received. Melissa indicated they will deliver nebulizer as soon as they can.

## 2012-03-14 ENCOUNTER — Encounter
Payer: Medicare Other | Attending: Physical Medicine and Rehabilitation | Admitting: Physical Medicine and Rehabilitation

## 2012-03-14 ENCOUNTER — Encounter: Payer: Self-pay | Admitting: Physical Medicine and Rehabilitation

## 2012-03-14 VITALS — BP 134/60 | HR 82 | Resp 14 | Ht 60.0 in | Wt 155.0 lb

## 2012-03-14 DIAGNOSIS — Z981 Arthrodesis status: Secondary | ICD-10-CM | POA: Insufficient documentation

## 2012-03-14 DIAGNOSIS — I10 Essential (primary) hypertension: Secondary | ICD-10-CM | POA: Insufficient documentation

## 2012-03-14 DIAGNOSIS — I2510 Atherosclerotic heart disease of native coronary artery without angina pectoris: Secondary | ICD-10-CM | POA: Insufficient documentation

## 2012-03-14 DIAGNOSIS — M4120 Other idiopathic scoliosis, site unspecified: Secondary | ICD-10-CM | POA: Insufficient documentation

## 2012-03-14 DIAGNOSIS — F329 Major depressive disorder, single episode, unspecified: Secondary | ICD-10-CM | POA: Insufficient documentation

## 2012-03-14 DIAGNOSIS — M545 Low back pain, unspecified: Secondary | ICD-10-CM | POA: Insufficient documentation

## 2012-03-14 DIAGNOSIS — E039 Hypothyroidism, unspecified: Secondary | ICD-10-CM | POA: Insufficient documentation

## 2012-03-14 DIAGNOSIS — E785 Hyperlipidemia, unspecified: Secondary | ICD-10-CM | POA: Insufficient documentation

## 2012-03-14 DIAGNOSIS — F3289 Other specified depressive episodes: Secondary | ICD-10-CM | POA: Insufficient documentation

## 2012-03-14 DIAGNOSIS — R209 Unspecified disturbances of skin sensation: Secondary | ICD-10-CM | POA: Insufficient documentation

## 2012-03-14 DIAGNOSIS — G8929 Other chronic pain: Secondary | ICD-10-CM | POA: Insufficient documentation

## 2012-03-14 DIAGNOSIS — G43909 Migraine, unspecified, not intractable, without status migrainosus: Secondary | ICD-10-CM | POA: Insufficient documentation

## 2012-03-14 DIAGNOSIS — M961 Postlaminectomy syndrome, not elsewhere classified: Secondary | ICD-10-CM

## 2012-03-14 DIAGNOSIS — M419 Scoliosis, unspecified: Secondary | ICD-10-CM

## 2012-03-14 MED ORDER — MORPHINE SULFATE ER 30 MG PO TBCR: 30.0000 mg | EXTENDED_RELEASE_TABLET | Freq: Every day | ORAL | Status: DC

## 2012-03-14 MED ORDER — HYDROCODONE-ACETAMINOPHEN 10-325 MG PO TABS: 1.0000 | ORAL_TABLET | Freq: Three times a day (TID) | ORAL | Status: DC | PRN

## 2012-03-14 NOTE — Patient Instructions (Signed)
Stay as active as pain permits 

## 2012-03-14 NOTE — Progress Notes (Signed)
Subjective:    Patient ID: Hannah Scott, female    DOB: 1950/05/27, 61 y.o.   MRN: [id number]  HPI The patient complains about chronic low back pain pain which radiates into right LE. The patient also complains about numbness and tingling, intermittendly after prolonged standing.  The problem has been stable . Hx of PSF, Hx of severe scoliosis, is not considering further surgery at this point.  Pain Inventory Average Pain 7 Pain Right Now 6 My pain is constant, burning, tingling and aching  In the last 24 hours, has pain interfered with the following? General activity 10 Relation with others 10 Enjoyment of life 10 What TIME of day is your pain at its worst? all of the time Sleep (in general) Poor  Pain is worse with: walking and standing Pain improves with: rest and medication Relief from Meds: 4  Mobility use a cane  Function disabled: date disabled  I need assistance with the following:  meal prep and household duties  Neuro/Psych weakness numbness tingling  Prior Studies Any changes since last visit?  no  Physicians involved in your care Any changes since last visit?  no   Family History  Problem Relation Age of Onset  . Heart attack Father   . Heart disease Father   . Heart failure Mother   . Pneumonia Mother   . Hypertension Mother   . Heart disease Mother   . Stroke Maternal Grandfather   . Hypertension Maternal Grandfather   . Asthma Paternal Uncle     PAT UNCLES  . Heart attack Paternal Uncle    History   Social History  . Marital Status: Married    Spouse Name: N/A    Number of Children: N/A  . Years of Education: N/A   Social History Main Topics  . Smoking status: Never Smoker   . Smokeless tobacco: Never Used  . Alcohol Use: No  . Drug Use: No  . Sexually Active: No   Other Topics Concern  . None   Social History Narrative  . None   Past Surgical History  Procedure Date  . Cataract extraction   . Cardiovascular stress test  09/07/05    Nuclear, was negative  . Breast lumpectomy 2003    radiation on right  . Appendectomy 1987    AT [facility]  . Total abdominal hysterectomy 1987    LSO, APPENDECTOMY  . Pelvic laparoscopy 1989    RSO, LYSIS OF ADHESIONS  . Oophorectomy     LSO -RSO  . Back surgery     Fusion  . Ankle surgery    Past Medical History  Diagnosis Date  . Asthma     PFTs, February, 2011, moderate obstructive disease with response to bronchodilators, normal lung volumes, moderate reduction in diffusing capacity  . Obstructive airway disease   . Hyperlipidemia   . Hypertension   . Hypothyroidism     Patient has had in the past that she does not need treatment  . CAD (coronary artery disease)     90% distal LAD in the past  /   nuclear, 2008, no ischemia, ejection fraction 70%  . Atrial septal aneurysm     Echo, 2008  . Pinched nerve     lower back  . Kyphoscoliosis   . Shortness of breath   . Endometriosis 1989    RIGHT TUBE  . Endometriosis 1987    LEFT TUBE/OVARY W FOCAL IN-SITU ENDOMETRIAL ADENOCARCINOMA  . Cancer 2002  DUCTAL CIS--S/P LUMPECTOMY, RADIATION AND 6 WEEKS OF TAMOXIFEN  . Ejection fraction     EF 60%, echo, October, 2008   BP 134/60  Pulse 82  Resp 14  Ht 5' (1.524 m)  Wt 155 lb (70.308 kg)  BMI 30.27 kg/m2  SpO2 94%    Review of Systems  Musculoskeletal: Positive for back pain.  Neurological: Positive for weakness and numbness.       Tingling  All other systems reviewed and are negative.       Objective:   Physical Exam Constitutional: She is oriented to person, place, and time. She appears well-developed and well-nourished.  HENT:  Head: Normocephalic.  Musculoskeletal: She exhibits tenderness.  Neurological: She is alert and oriented to person, place, and time.  Skin: Skin is warm and dry.  Psychiatric: She has a normal mood and affect.  Symmetric normal motor tone is noted throughout. Normal muscle bulk. Muscle testing reveals 5/5 muscle  strength of the upper extremity, and 5/5 of the lower extremity. Full range of motion in upper and lower extremities. ROM of spine is severely restricted.  DTR in the upper and lower extremity are present and symmetric 2+. No clonus is noted.  Patient arises from chair with difficulty.Wide based gait with extreme kyphotic posture, almost in a 90 degree angle of her upper body .  Severe thoracolumbar scoliosis.        Assessment & Plan:  This is a 61 year old female with  1. Chronic low back pain, status post posterior fusion in the lumbar spine  2. Severe scoliosis  3. Migraines  4. CAD  5. depression  Plan :  Continue with medication, refilled pain medication today. Continue with walking as complains allow. Walking or exercising in the pool would be beneficial for the patient, but she has no access to a pool. [place] Wanted to prescribe aquatic PT, but patient denies. Suggested seeing a pain psychologist to help her to cope with her situation, but patient denies.  Follow up in 1 month.

## 2012-03-16 ENCOUNTER — Telehealth: Payer: Self-pay | Admitting: Internal Medicine

## 2012-03-16 MED ORDER — PREDNISONE 20 MG PO TABS: ORAL_TABLET | ORAL | Status: DC

## 2012-03-16 NOTE — Telephone Encounter (Signed)
Prednisone 20 mg 1/2 tid # 9

## 2012-03-16 NOTE — Telephone Encounter (Signed)
Hopp please advise  

## 2012-03-16 NOTE — Telephone Encounter (Signed)
RX sent electronically. Patient aware

## 2012-03-16 NOTE — Telephone Encounter (Signed)
Patient called stating that she was in last week for an asthma flare up and it is flaring up again and would like to know if she can have a refill of the prednisone 20mg  Sig: 4 tablets x 2 days, 3 tabs x 2 days, 2 tabs x 2 days, 1 tab x 2 days called into her pharmacy, [address], [place]. Please advise and inform patient of advice.

## 2012-03-19 ENCOUNTER — Other Ambulatory Visit: Payer: Self-pay | Admitting: Internal Medicine

## 2012-03-20 NOTE — Telephone Encounter (Signed)
Rx sent.    MW 

## 2012-03-28 ENCOUNTER — Ambulatory Visit: Payer: Medicare Other | Admitting: Endocrinology

## 2012-04-02 ENCOUNTER — Other Ambulatory Visit: Payer: Self-pay

## 2012-04-02 MED ORDER — ATORVASTATIN CALCIUM 20 MG PO TABS: 20.0000 mg | ORAL_TABLET | Freq: Every day | ORAL | Status: DC

## 2012-04-10 ENCOUNTER — Encounter: Payer: Self-pay | Admitting: Physical Medicine & Rehabilitation

## 2012-04-10 ENCOUNTER — Encounter: Payer: Medicare Other | Attending: Physical Medicine and Rehabilitation

## 2012-04-10 ENCOUNTER — Ambulatory Visit (HOSPITAL_BASED_OUTPATIENT_CLINIC_OR_DEPARTMENT_OTHER): Payer: Medicare Other | Admitting: Physical Medicine & Rehabilitation

## 2012-04-10 VITALS — BP 137/78 | HR 70 | Resp 14 | Ht 59.0 in | Wt 159.0 lb

## 2012-04-10 DIAGNOSIS — M1611 Unilateral primary osteoarthritis, right hip: Secondary | ICD-10-CM

## 2012-04-10 DIAGNOSIS — M961 Postlaminectomy syndrome, not elsewhere classified: Secondary | ICD-10-CM

## 2012-04-10 DIAGNOSIS — F3289 Other specified depressive episodes: Secondary | ICD-10-CM | POA: Insufficient documentation

## 2012-04-10 DIAGNOSIS — G43909 Migraine, unspecified, not intractable, without status migrainosus: Secondary | ICD-10-CM | POA: Insufficient documentation

## 2012-04-10 DIAGNOSIS — E785 Hyperlipidemia, unspecified: Secondary | ICD-10-CM | POA: Insufficient documentation

## 2012-04-10 DIAGNOSIS — M4120 Other idiopathic scoliosis, site unspecified: Secondary | ICD-10-CM | POA: Insufficient documentation

## 2012-04-10 DIAGNOSIS — J45909 Unspecified asthma, uncomplicated: Secondary | ICD-10-CM | POA: Insufficient documentation

## 2012-04-10 DIAGNOSIS — M545 Low back pain, unspecified: Secondary | ICD-10-CM | POA: Insufficient documentation

## 2012-04-10 DIAGNOSIS — E039 Hypothyroidism, unspecified: Secondary | ICD-10-CM | POA: Insufficient documentation

## 2012-04-10 DIAGNOSIS — M419 Scoliosis, unspecified: Secondary | ICD-10-CM

## 2012-04-10 DIAGNOSIS — M549 Dorsalgia, unspecified: Secondary | ICD-10-CM

## 2012-04-10 DIAGNOSIS — Z5181 Encounter for therapeutic drug level monitoring: Secondary | ICD-10-CM

## 2012-04-10 DIAGNOSIS — Z981 Arthrodesis status: Secondary | ICD-10-CM | POA: Insufficient documentation

## 2012-04-10 DIAGNOSIS — M169 Osteoarthritis of hip, unspecified: Secondary | ICD-10-CM

## 2012-04-10 DIAGNOSIS — I10 Essential (primary) hypertension: Secondary | ICD-10-CM | POA: Insufficient documentation

## 2012-04-10 DIAGNOSIS — F329 Major depressive disorder, single episode, unspecified: Secondary | ICD-10-CM | POA: Insufficient documentation

## 2012-04-10 DIAGNOSIS — R209 Unspecified disturbances of skin sensation: Secondary | ICD-10-CM | POA: Insufficient documentation

## 2012-04-10 DIAGNOSIS — I2510 Atherosclerotic heart disease of native coronary artery without angina pectoris: Secondary | ICD-10-CM | POA: Insufficient documentation

## 2012-04-10 DIAGNOSIS — G8928 Other chronic postprocedural pain: Secondary | ICD-10-CM | POA: Insufficient documentation

## 2012-04-10 MED ORDER — MORPHINE SULFATE ER 30 MG PO TBCR: 30.0000 mg | EXTENDED_RELEASE_TABLET | Freq: Every day | ORAL | Status: DC

## 2012-04-10 MED ORDER — HYDROCODONE-ACETAMINOPHEN 10-325 MG PO TABS: 1.0000 | ORAL_TABLET | Freq: Three times a day (TID) | ORAL | Status: DC | PRN

## 2012-04-10 NOTE — Patient Instructions (Signed)
Your right leg is not shorter than the left instead it is a combination between the scoliosis in your spine as well as the hip arthritis You'll see Hannah Scott next visit Continue current medications I think you would benefit from a hip replacement I think you would benefit from pain psychology evaluation I think you would benefit from aquatic exercise

## 2012-04-10 NOTE — Progress Notes (Signed)
Subjective:    Patient ID: Hannah Scott, female    DOB: 22-Mar-1951, 61 y.o.   MRN: [id number]  HPI  Hannah Scott is a 61 year old female, who has severe lumbar scoliosis  and spinal stenosis primarily affecting L1-L2 nerve roots on the right  side. In addition, she has severe right hip osteoarthritis,  hypertrophic spurring and subchondral sclerosis. She has been managed  with narcotic analgesics initially just with hydrocodone, then we  trialed her on fentanyl patch, which she could not tolerate. We trialed  her on MS Contin, which she could not tolerate during the day, but she  could tolerate at night. When she takes it at night, it helps her sleep  through the night.  Pain Inventory Average Pain 8 Pain Right Now 7 My pain is constant, burning, tingling and aching  In the last 24 hours, has pain interfered with the following? General activity 10 Relation with others 10 Enjoyment of life 10 What TIME of day is your pain at its worst? varies Sleep (in general) Fair  Pain is worse with: walking, bending and some activites Pain improves with: medication Relief from Meds: 4  Mobility use a cane ability to climb steps?  yes do you drive?  yes  Function disabled: date disabled 2008 I need assistance with the following:  meal prep, household duties and shopping  Neuro/Psych weakness numbness tingling depression  Prior Studies Any changes since last visit?  no  Physicians involved in your care Any changes since last visit?  no   Family History  Problem Relation Age of Onset  . Heart attack Father   . Heart disease Father   . Heart failure Mother   . Pneumonia Mother   . Hypertension Mother   . Heart disease Mother   . Stroke Maternal Grandfather   . Hypertension Maternal Grandfather   . Asthma Paternal Uncle     PAT UNCLES  . Heart attack Paternal Uncle    History   Social History  . Marital Status: Married    Spouse Name: N/A    Number of Children: N/A  .  Years of Education: N/A   Social History Main Topics  . Smoking status: Never Smoker   . Smokeless tobacco: Never Used  . Alcohol Use: No  . Drug Use: No  . Sexually Active: No   Other Topics Concern  . None   Social History Narrative  . None   Past Surgical History  Procedure Date  . Cataract extraction   . Cardiovascular stress test 09/07/05    Nuclear, was negative  . Breast lumpectomy 2003    radiation on right  . Appendectomy 1987    AT [facility]  . Total abdominal hysterectomy 1987    LSO, APPENDECTOMY  . Pelvic laparoscopy 1989    RSO, LYSIS OF ADHESIONS  . Oophorectomy     LSO -RSO  . Back surgery     Fusion  . Ankle surgery    Past Medical History  Diagnosis Date  . Asthma     PFTs, February, 2011, moderate obstructive disease with response to bronchodilators, normal lung volumes, moderate reduction in diffusing capacity  . Obstructive airway disease   . Hyperlipidemia   . Hypertension   . Hypothyroidism     Patient has had in the past that she does not need treatment  . CAD (coronary artery disease)     90% distal LAD in the past  /   nuclear, 2008, no ischemia, ejection fraction  70%  . Atrial septal aneurysm     Echo, 2008  . Pinched nerve     lower back  . Kyphoscoliosis   . Shortness of breath   . Endometriosis 1989    RIGHT TUBE  . Endometriosis 1987    LEFT TUBE/OVARY W FOCAL IN-SITU ENDOMETRIAL ADENOCARCINOMA  . Cancer 2002    DUCTAL CIS--S/P LUMPECTOMY, RADIATION AND 6 WEEKS OF TAMOXIFEN  . Ejection fraction     EF 60%, echo, October, 2008   BP 137/78  Pulse 70  Resp 14  Ht 4\' 11"  (1.499 m)  Wt 159 lb (72.122 kg)  BMI 32.11 kg/m2  SpO2 97%     Review of Systems  Musculoskeletal: Positive for myalgias, back pain and arthralgias.  Neurological: Positive for weakness and numbness.  Psychiatric/Behavioral: Positive for dysphoric mood.  All other systems reviewed and are negative.       Objective:   Physical Exam  oriented to  person, place, and time. She appears well-developed and well-nourished.  HENT:  Head: Normocephalic and atraumatic.  Eyes: Conjunctivae are normal. Pupils are equal, round, and reactive to light.  Neck: Normal range of motion.  Musculoskeletal:       Lumbar back: She exhibits decreased range of motion, tenderness and pain.       Arms:      Scoliosis as illustrated  Neurological: She is alert and oriented to person, place, and time. No sensory deficit. Gait abnormal.  Reflex Scores:      Patellar reflexes are 1+ on the right side and 1+ on the left side.      Achilles reflexes are 1+ on the right side and 1+ on the left side.      Gait is forward flexed at the hips decreased right hip range of motion. Right toe drag Motor strength is normal in the arms Motor strength is 4/5 in the hip flexors, knee extensors, ankle dorsiflexors are 4 minus/5 Hip range of motion is reduced on the right side with internal and external rotation as well as flexion  Psychiatric: She has a normal mood and affect. Her behavior is normal. Judgment and thought content normal.       Assessment & Plan:  1. Lumbar and thoracic scoliosis with chronic pain 2 lumbar postlaminectomy syndrome 3. Lumbar spinal stenosis with intermittent claudication 4. Right hip osteoarthritis severe patient is considering surgery Chronic pain syndrome Discussed medication management although it is not perfect it does help to some degree and is not producing any significant side effects.

## 2012-04-11 ENCOUNTER — Ambulatory Visit: Payer: Medicare Other | Admitting: Endocrinology

## 2012-04-12 ENCOUNTER — Encounter: Payer: Self-pay | Admitting: Obstetrics and Gynecology

## 2012-05-02 ENCOUNTER — Ambulatory Visit (INDEPENDENT_AMBULATORY_CARE_PROVIDER_SITE_OTHER)
Admission: RE | Admit: 2012-05-02 | Discharge: 2012-05-02 | Disposition: A | Discharge status: Home / Self Care | Payer: Medicare Other | Source: Ambulatory Visit | Attending: Internal Medicine | Admitting: Internal Medicine

## 2012-05-02 ENCOUNTER — Encounter: Payer: Self-pay | Admitting: Internal Medicine

## 2012-05-02 ENCOUNTER — Ambulatory Visit (INDEPENDENT_AMBULATORY_CARE_PROVIDER_SITE_OTHER): Payer: Medicare Other | Admitting: Internal Medicine

## 2012-05-02 VITALS — BP 134/86 | HR 72 | Temp 98.1°F | Wt 160.0 lb

## 2012-05-02 DIAGNOSIS — J45909 Unspecified asthma, uncomplicated: Secondary | ICD-10-CM

## 2012-05-02 DIAGNOSIS — R5383 Other fatigue: Secondary | ICD-10-CM

## 2012-05-02 DIAGNOSIS — R5381 Other malaise: Secondary | ICD-10-CM

## 2012-05-02 DIAGNOSIS — R079 Chest pain, unspecified: Secondary | ICD-10-CM

## 2012-05-02 DIAGNOSIS — E039 Hypothyroidism, unspecified: Secondary | ICD-10-CM

## 2012-05-02 LAB — TROPONIN I: Troponin I: <0.01 ng/mL (ref <0.06)

## 2012-05-02 LAB — D-DIMER, QUANTITATIVE: D-Dimer, Quant: 0.62 ug/mL-FEU — ABNORMAL HIGH (ref 0.00–0.48)

## 2012-05-02 MED ORDER — MOMETASONE FURO-FORMOTEROL FUM 200-5 MCG/ACT IN AERO: 2.0000 | INHALATION_SPRAY | Freq: Two times a day (BID) | RESPIRATORY_TRACT | Status: DC

## 2012-05-02 MED ORDER — SERTRALINE HCL 50 MG PO TABS: ORAL_TABLET | ORAL | Status: DC

## 2012-05-02 NOTE — Patient Instructions (Addendum)
Order for x-rays entered into  the computer; these will be performed at [address] [facility]. across from [facility]. No appointment is necessary.  If you activate My Chart; the results can be released to you as soon as they populate from the lab. If you choose not to use this program; the labs have to be reviewed, copied & mailed   causing a delay in getting the results to you.  Dulera 2 inhalations every 12 hours; gargle and spit after use  Take the EKG to any emergency room or preop visits. There are nonspecific changes; as long as there is no new change these are not clinically significant

## 2012-05-02 NOTE — Progress Notes (Signed)
  Subjective:    Patient ID: Hannah Scott, female    DOB: 1951-04-07, 62 y.o.   MRN: [id number]  HPI  She has significant chest tightness even without wheezing 2-3 X/ day even @ rest.The rescue agent helps. She is on Qvar but no other maintenance agents. When she took Advair it caused headache. She's not tried the other maintenance agents such as  Dulera or Symbicort.  She denies palpitations or dysrhythmias. She also has no claudication    Review of Systems FATIGUE: Onset: several months   Fatigue without exertion: yes   Physical limitations: ADL mainly compromised by back issues Components of motivational & physical fatigue  Symptoms Fever: no  Night sweats: no  Weight loss: no; progressive weight gain since I 131 Rx  Exertional chest pain: no Cough: minor, dry  Hemoptysis:no  New medications:no Leg swelling: no  PND: only in early am  Melena:no Adenopathy: no Severe snoring:? Daytime sleepiness: some  Skin/hair/nails changes: skin dry chronically  Feeling depressed:yes ; Sertraline initially helped Anhedonia: yes Altered appetite: fair  Poor sleep: yes due to back pain        Objective:   Physical Exam Gen.:  Adequately nourished in appearance. Alert, appropriate and cooperative throughout exam. Head: Normocephalic without obvious abnormalities  Eyes: No corneal or conjunctival inflammation noted.  Extraocular motion intact. No lid lag or proptosis.  Nose: External nasal exam reveals no deformity or inflammation. Nasal mucosa are pink and moist. No lesions or exudates noted.  Mouth: Oral mucosa and oropharynx reveal no lesions or exudates. Teeth in good repair. Neck: No deformities, masses, or tenderness noted.  Thyroid normal. Lungs: Normal respiratory effort; chest expands symmetrically. Lungs are clear to auscultation without rales, wheezes, or increased work of breathing.DECREASED BS Heart: Normal rate and rhythm. Normal S1 and S2. No gallop, click, or rub. Grade  1/2 systolic basilar murmur. Abdomen:  abdomen soft and nontender. No masses, organomegaly or hernias noted.                                                                                   Musculoskeletal/extremities: Severe kyphoscoliosis noted of  the thoracic / lumbar spine. No clubbing, cyanosis, edema, or deformity noted. Joints : mild DIP changes. Nail health  good. Vascular: Carotid, radial artery, dorsalis pedis and  posterior tibial pulses are full and equal. No bruits present. Neurologic: Alert and oriented x3.  Skin: Intact without suspicious lesions or rashes.Dry hands Lymph: No cervical, axillary lymphadenopathy present. Psych: Mood and affect are normal. Normally interactive                                                                                         Assessment & Plan:

## 2012-05-03 ENCOUNTER — Encounter: Payer: Self-pay | Admitting: Cardiology

## 2012-05-03 ENCOUNTER — Ambulatory Visit (INDEPENDENT_AMBULATORY_CARE_PROVIDER_SITE_OTHER): Payer: Medicare Other | Admitting: Cardiology

## 2012-05-03 ENCOUNTER — Encounter: Payer: Self-pay | Admitting: Internal Medicine

## 2012-05-03 ENCOUNTER — Telehealth: Payer: Self-pay

## 2012-05-03 ENCOUNTER — Ambulatory Visit (INDEPENDENT_AMBULATORY_CARE_PROVIDER_SITE_OTHER): Payer: Medicare Other | Admitting: Internal Medicine

## 2012-05-03 ENCOUNTER — Other Ambulatory Visit: Payer: Self-pay | Admitting: Internal Medicine

## 2012-05-03 VITALS — BP 124/80 | HR 57 | Temp 98.4°F | Resp 16 | Wt 159.0 lb

## 2012-05-03 VITALS — BP 138/82 | HR 70 | Ht 59.0 in | Wt 158.1 lb

## 2012-05-03 DIAGNOSIS — E039 Hypothyroidism, unspecified: Secondary | ICD-10-CM

## 2012-05-03 DIAGNOSIS — R0600 Dyspnea, unspecified: Secondary | ICD-10-CM

## 2012-05-03 DIAGNOSIS — I2510 Atherosclerotic heart disease of native coronary artery without angina pectoris: Secondary | ICD-10-CM

## 2012-05-03 DIAGNOSIS — R791 Abnormal coagulation profile: Secondary | ICD-10-CM

## 2012-05-03 DIAGNOSIS — M419 Scoliosis, unspecified: Secondary | ICD-10-CM

## 2012-05-03 DIAGNOSIS — E890 Postprocedural hypothyroidism: Secondary | ICD-10-CM

## 2012-05-03 DIAGNOSIS — R7989 Other specified abnormal findings of blood chemistry: Secondary | ICD-10-CM | POA: Insufficient documentation

## 2012-05-03 DIAGNOSIS — R748 Abnormal levels of other serum enzymes: Secondary | ICD-10-CM | POA: Insufficient documentation

## 2012-05-03 DIAGNOSIS — J45909 Unspecified asthma, uncomplicated: Secondary | ICD-10-CM

## 2012-05-03 LAB — BASIC METABOLIC PANEL
CO2: 31 mEq/L (ref 19–32)
Calcium: 9.2 mg/dL (ref 8.4–10.5)
Creatinine, Ser: 0.8 mg/dL (ref 0.4–1.2)
Glucose, Bld: 100 mg/dL — ABNORMAL HIGH (ref 70–99)

## 2012-05-03 LAB — CBC WITH DIFFERENTIAL/PLATELET
Basophils Relative: 0.1 % (ref 0.0–3.0)
Eosinophils Relative: 5 % (ref 0.0–5.0)
HCT: 39.2 % (ref 36.0–46.0)
Hemoglobin: 12.8 g/dL (ref 12.0–15.0)
Lymphs Abs: 0.7 10*3/uL (ref 0.7–4.0)
Monocytes Relative: 4.5 % (ref 3.0–12.0)
Neutro Abs: 5.7 10*3/uL (ref 1.4–7.7)
RBC: 4.02 Mil/uL (ref 3.87–5.11)
WBC: 7 10*3/uL (ref 4.5–10.5)

## 2012-05-03 LAB — TSH: TSH: 81.43 u[IU]/mL — ABNORMAL HIGH (ref 0.35–5.50)

## 2012-05-03 MED ORDER — LEVOTHYROXINE SODIUM 75 MCG PO TABS: 75.0000 ug | ORAL_TABLET | Freq: Every day | ORAL | Status: DC

## 2012-05-03 NOTE — Assessment & Plan Note (Signed)
Patient has a mildly elevated d-dimer. I will leave it up to Dr. Alwyn Ren to see if he wants to follow this up.

## 2012-05-03 NOTE — Progress Notes (Signed)
Subjective:     Patient ID: Hannah Scott, female   DOB: 02/03/1951, 62 y.o.   MRN: [id number]  HPI [name] is a 62 y/o woman, a pt of Dr. George Hugh, sent over from Dr. Henrietta Hoover office for an acute visit for severe hypothyroidism.  Pt has developed hyperthyroidism in the last year. She has previously been hypothyroid and has been on Synthroid in the past. She had a Thyroid Uptake and Scan showing toxic MNG in 10/2011. She had RAI ablation on 12/30/2011. Last nl TSH 2.5 mo ago.   Today she saw Dr. Myrtis Ser for evaluation for SOB, fatigue and an elevated CPK obtained in Dr. Frederik Pear office. He called me with the below TSH: Lab Results  Component Value Date   TSH 81.43 Verified by manual dilution.* 05/02/2012   She has gained 20 lbs in these last 2.5 months. She feels exhausted. She is a little constipated. She does not have a lot of hair loss. She noticed hoarseness. She has back pain and cannot sleep well. She cannot concentrate to activities as well as before.   She had 2 AMI's she mentions, one in 26 and 1998.   Review of Systems Constitutional: + weight gain/loss, + fatigue, + subjective hypothermia Eyes: no blurry vision, no xerophthalmia ENT: no sore throat, no nodules palpated in throat, no dysphagia/odynophagia, + hoarseness Cardiovascular: no CP/has SOB/palpitations/leg swelling Respiratory: no cough/has SOB initially from asthma Gastrointestinal: no N/V/D/ a little C Musculoskeletal: + muscle/joint aches and back pain  Skin: no rashes, dry skin that is not new Neurological: no  - pinched nerve in back >> Right leg numbness Psychiatric: + depression/ + anxiety >> worse lately     Objective:   Physical Exam BP 124/80  Pulse 57  Temp 98.4 F (36.9 C) (Oral)  Resp 16  Wt 159 lb (72.122 kg)  SpO2 95% Constitutional: overweight, appears fatigued, slow speech Eyes: PERRLA, EOMI, no exophthalmos ENT: moist mucous membranes, symmetric slight thyromegaly, no cervical  lymphadenopathy Cardiovascular: RRR, No MRG Respiratory: CTA B Gastrointestinal: abdomen soft, NT, ND, BS+ Neurological: no tremor with outstretched hands, DTR with delayed relaxation in all 4 Skin: very dry    Assessment:     1. Severe, post-ablative hpothyroidism    Plan:     Pt with high TSH after ablation for Toxic MNG. - will start Synthroid brand name, at 75 mcg daily - we discussed proper intake of Levothyroxine, >30 min before b'fast, separated by >4h from antacids, calcium, iron, MVI - will schedule her to come back to see Dr Everardo All in 6 weeks for clinical eval. and repeat thyroid tests

## 2012-05-03 NOTE — Telephone Encounter (Signed)
Left message on VM for patient to return call when available  

## 2012-05-03 NOTE — Telephone Encounter (Signed)
Message copied by Maurice Small on Thu May 03, 2012  3:53 PM ------      Message from: Pecola Lawless      Created: Thu May 03, 2012  3:35 PM       Please call Rx  to drug store for Synthroid 25 mcg #30 to pharmacy . 1 qd x 5 then 2 qd

## 2012-05-03 NOTE — Patient Instructions (Addendum)
Please take Synthroid every day on an an empty stomach, separated by at least 4 h from calcium, iron or acid reflux medication.

## 2012-05-03 NOTE — Assessment & Plan Note (Signed)
Coronary disease is stable. There is no EKG change. The troponin yesterday was normal. The abnormal CPK is probably related to the hypothyroidism.

## 2012-05-03 NOTE — Progress Notes (Signed)
Patient ID: Hannah Scott, female   DOB: [birth year], 62 y.o.   MRN: [id number]   HPI  The patient was added to my schedule today to help with evaluation of chest tightness. She was seen yesterday by primary care. There was concern about her tightness. D-dimer was ordered and was slightly elevated. CPK was ordered and it came back 299 with an MB of 11.5. Troponin was normal. There was concern about these labs and the patient was referred to me for further followup. Today she tells me that she feels general marked fatigue. She's also gained 20 pounds. She did have her thyroid ablated in the fall of 2013. Several months ago her TSH was 2.6. It is my understanding that she was to have returned to see endocrinology 4-6 weeks later. I do not see any evidence of an office visit at that time.  As part of today's evaluation I have reviewed her records extensively. The first set of labs that came through were her cardiac enzymes. The other labs were not available until I went looking for them. Eventually these came back showing normal renal function and a normal hemoglobin. However her TSH is dramatically elevated at 81.  Allergies  Allergen Reactions  . Fluticasone-Salmeterol     REACTION: headache  Never tried Symbicort or Dulera    Current Outpatient Prescriptions  Medication Sig Dispense Refill  . albuterol (PROAIR HFA) 108 (90 BASE) MCG/ACT inhaler Inhale 1-2 puffs into the lungs every 6 (six) hours as needed.  1 Inhaler  6  . amLODipine (NORVASC) 2.5 MG tablet TAKE ONE TABLET BY MOUTH EVERY DAY  90 tablet  1  . aspirin 81 MG tablet Take 81 mg by mouth daily.        [name] atorvastatin (LIPITOR) 20 MG tablet Take 1 tablet (20 mg total) by mouth daily.  90 tablet  1  . beclomethasone (QVAR) 80 MCG/ACT inhaler Inhale 2 puffs into the lungs every 12 (twelve) hours. Gargle and swallow after use  8.7 Inhaler  11  . benazepril (LOTENSIN) 40 MG tablet TAKE ONE-HALF TABLET BY MOUTH EVERY DAY  45 tablet  1  .  HYDROcodone-acetaminophen (NORCO) 10-325 MG per tablet Take 1 tablet by mouth every 8 (eight) hours as needed for pain.  90 tablet  0  . ipratropium-albuterol (DUONEB) 0.5-2.5 (3) MG/3ML SOLN Take 3 mLs by nebulization every 4 (four) hours as needed. Must not be qid unless during exacerbation. Dx 493.90  360 mL  0  . metoprolol tartrate (LOPRESSOR) 25 MG tablet TAKE ONE TABLET BY MOUTH TWICE DAILY  180 tablet  1  . mometasone-formoterol (DULERA) 200-5 MCG/ACT AERO Inhale 2 puffs into the lungs 2 (two) times daily.  8.8 g  5  . morphine (MS CONTIN) 30 MG 12 hr tablet Take 1 tablet (30 mg total) by mouth at bedtime.  30 tablet  0  . sertraline (ZOLOFT) 50 MG tablet 1 qd  30 tablet  5    History   Social History  . Marital Status: Married    Spouse Name: N/A    Number of Children: N/A  . Years of Education: N/A   Occupational History  . Not on file.   Social History Main Topics  . Smoking status: Never Smoker   . Smokeless tobacco: Never Used  . Alcohol Use: No  . Drug Use: No  . Sexually Active: No   Other Topics Concern  . Not on file   Social History Narrative  .  No narrative on file    Family History  Problem Relation Age of Onset  . Heart attack Father   . Heart disease Father   . Heart failure Mother   . Pneumonia Mother   . Hypertension Mother   . Heart disease Mother   . Stroke Maternal Grandfather   . Hypertension Maternal Grandfather   . Asthma Paternal Uncle     PAT UNCLES  . Heart attack Paternal Uncle     Past Medical History  Diagnosis Date  . Asthma     PFTs, February, 2011, moderate obstructive disease with response to bronchodilators, normal lung volumes, moderate reduction in diffusing capacity  . Obstructive airway disease   . Hyperlipidemia   . Hypertension   . Hypothyroidism     Patient has had in the past that she does not need treatment  . CAD (coronary artery disease)     90% distal LAD in the past  /   nuclear, 2008, no ischemia,  ejection fraction 70%  . Atrial septal aneurysm     Echo, 2008  . Pinched nerve     lower back  . Kyphoscoliosis   . Shortness of breath   . Endometriosis 1989    RIGHT TUBE  . Endometriosis 1987    LEFT TUBE/OVARY W FOCAL IN-SITU ENDOMETRIAL ADENOCARCINOMA  . Cancer 2002    DUCTAL CIS--S/P LUMPECTOMY, RADIATION AND 6 WEEKS OF TAMOXIFEN  . Ejection fraction     EF 60%, echo, October, 2008  . Elevated CPK     January, 2014  . D-dimer, elevated     January, 2014    Past Surgical History  Procedure Date  . Cataract extraction   . Cardiovascular stress test 09/07/05    Nuclear, was negative  . Breast lumpectomy 2003    radiation on right  . Appendectomy 1987    AT TAH  . Total abdominal hysterectomy 1987    LSO, APPENDECTOMY  . Pelvic laparoscopy 1989    RSO, LYSIS OF ADHESIONS  . Oophorectomy     LSO -RSO  . Back surgery     Fusion  . Ankle surgery     Patient Active Problem List  Diagnosis  . DEPRESSION  . ESOPHAGEAL REFLUX  . FATIGUE  . MIGRAINES, HX OF  . Endometriosis  . Spinal stenosis, lumbar region, with neurogenic claudication  . Lumbar postlaminectomy syndrome  . Osteoarthritis of right hip  . Contracture of right hip  . Scoliosis  . Asthma  . Hypothyroidism  . CAD (coronary artery disease)  . Ejection fraction  . Atrial septal aneurysm  . Hyperlipidemia  . Hypertension  . Shortness of breath  . Multinodular goiter  . Hyperthyroidism  . Elevated CPK  . D-dimer, elevated    ROS   Patient denies fever, chills, headache, sweats, rash, change in vision, change in hearing, cough, nausea vomiting, urinary symptoms. All other systems are reviewed and are negative.  PHYSICAL EXAM   Patient is gaining weight. She is oriented to person time and place. Affect is normal. There is no jugular venous distention. Lungs are clear. Respiratory effort is nonlabored. Cardiac exam reveals S1 and S2. There no clicks or significant murmurs. The abdomen is soft.  There is no peripheral edema. There no musculoskeletal deformities. There are no skin rashes.  Filed Vitals:   05/03/12 1413  BP: 138/82  Pulse: 70  Height: 4\' 11"  (1.499 m)  Weight: 158 lb 1.9 oz (71.723 kg)  SpO2: 98%  I reviewed the EKG done yesterday the primary care office. There was no acute change.  ASSESSMENT & PLAN

## 2012-05-03 NOTE — Assessment & Plan Note (Signed)
Troponin is normal. Elevated CPK is probably related to hypothyroidism.

## 2012-05-03 NOTE — Assessment & Plan Note (Addendum)
It appears that over time the patient has become markedly hypothyroid after her I-131 ablation. I will arrange for very early followup with the endocrinology team to get her treatment started. I believe that her elevated CPK and her weight gain is related to this problem. I suspect that her overall fatigue he is also related to this problem. The patient received radioactive iodine in the fall of 2013. 2-3 months ago her TSH was in the range of 2.6. TSH today is 81. I will be contacting her primary physician and endocrinology to arrange for immediate followup.

## 2012-05-03 NOTE — Patient Instructions (Addendum)
You need to go to Dr George Hugh office this afternoon when you leave our clinic.  They are in the [facility] at: [address].  They are off [address]  Their phone number is: [phone].

## 2012-05-04 ENCOUNTER — Telehealth: Payer: Self-pay | Admitting: *Deleted

## 2012-05-04 NOTE — Telephone Encounter (Signed)
Hannah Scott, It is OK, she can continue the generic for now.

## 2012-05-04 NOTE — Telephone Encounter (Signed)
CALLED PATIENT TO CLARIFY SHE IS TAKING THE SYNTHROID DOSE OF 75MG . PATIENT STATES SHE TOOK HER FIRST DOSE THIS AM AND WHEN HER HUSBAND PICKED UP MEDICATION FROM PHARMACY THEY HAD FILLED IT FOR THE GENERIC OF LEVOTHYROXINE AND NOT THE BRAND NAME. AS YOU HAD DISCUSSED WITH HER YESTERDAY OF TAKING ONLY THE BRAND NAME. PLEASE ADVISE IF NEED TO CHANGE.

## 2012-05-04 NOTE — Telephone Encounter (Signed)
PATIENT NOTIFIED OF OK TO CONTINUE GENERIC SYNTHROID FOR NOW.

## 2012-05-04 NOTE — Telephone Encounter (Signed)
Message copied by Elnora Morrison on Fri May 04, 2012  2:13 PM ------      Message from: Carlus Pavlov      Created: Fri May 04, 2012  1:36 PM       Fannie Knee,      Can you call pt and make sure she is taking the 75 mcg Synthroid that I rx'ed and not the lower dose Rx'd by Dr. Alwyn Ren yesterday?      Thank you,      CG

## 2012-05-04 NOTE — Telephone Encounter (Signed)
Spoke with patient, Patient was seen by Endocrinologist yesterday and started on Thyroid medication

## 2012-05-09 ENCOUNTER — Encounter: Payer: Medicare Other | Admitting: Physical Medicine and Rehabilitation

## 2012-05-11 ENCOUNTER — Other Ambulatory Visit: Payer: Self-pay | Admitting: Internal Medicine

## 2012-05-14 ENCOUNTER — Encounter
Payer: Medicare Other | Attending: Physical Medicine and Rehabilitation | Admitting: Physical Medicine and Rehabilitation

## 2012-05-14 ENCOUNTER — Encounter: Payer: Self-pay | Admitting: Physical Medicine and Rehabilitation

## 2012-05-14 VITALS — BP 142/71 | HR 76 | Resp 14 | Ht 59.0 in | Wt 157.6 lb

## 2012-05-14 DIAGNOSIS — G8929 Other chronic pain: Secondary | ICD-10-CM

## 2012-05-14 DIAGNOSIS — M961 Postlaminectomy syndrome, not elsewhere classified: Secondary | ICD-10-CM

## 2012-05-14 DIAGNOSIS — M419 Scoliosis, unspecified: Secondary | ICD-10-CM

## 2012-05-14 DIAGNOSIS — M549 Dorsalgia, unspecified: Secondary | ICD-10-CM

## 2012-05-14 DIAGNOSIS — M545 Low back pain, unspecified: Secondary | ICD-10-CM | POA: Insufficient documentation

## 2012-05-14 DIAGNOSIS — M4120 Other idiopathic scoliosis, site unspecified: Secondary | ICD-10-CM | POA: Insufficient documentation

## 2012-05-14 DIAGNOSIS — F329 Major depressive disorder, single episode, unspecified: Secondary | ICD-10-CM | POA: Insufficient documentation

## 2012-05-14 DIAGNOSIS — Z981 Arthrodesis status: Secondary | ICD-10-CM | POA: Insufficient documentation

## 2012-05-14 DIAGNOSIS — F3289 Other specified depressive episodes: Secondary | ICD-10-CM | POA: Insufficient documentation

## 2012-05-14 DIAGNOSIS — G43909 Migraine, unspecified, not intractable, without status migrainosus: Secondary | ICD-10-CM | POA: Insufficient documentation

## 2012-05-14 DIAGNOSIS — I2510 Atherosclerotic heart disease of native coronary artery without angina pectoris: Secondary | ICD-10-CM | POA: Insufficient documentation

## 2012-05-14 MED ORDER — MORPHINE SULFATE ER 30 MG PO TBCR: 30.0000 mg | EXTENDED_RELEASE_TABLET | Freq: Every day | ORAL | Status: DC

## 2012-05-14 MED ORDER — HYDROCODONE-ACETAMINOPHEN 10-325 MG PO TABS: 1.0000 | ORAL_TABLET | Freq: Three times a day (TID) | ORAL | Status: DC | PRN

## 2012-05-14 NOTE — Progress Notes (Signed)
Subjective:    Patient ID: Hannah Scott, female    DOB: 07/09/1950, 62 y.o.   MRN: [id number]  HPI The patient complains about chronic low back pain pain which radiates into right LE. The patient also complains about numbness and tingling, intermittendly after prolonged standing.  The problem has been stable . Hx of PSF, Hx of severe scoliosis, is not considering further surgery at this point. Patient has seen endocrinologist , her TSH was 109, she is taking Synthroid right now, she states, that she is feeling better, but her thyroid medication might be increased at her next visit with the endocrinologist.  Pain Inventory Average Pain 8 Pain Right Now 6 My pain is constant, burning, tingling and aching  In the last 24 hours, has pain interfered with the following? General activity 10 Relation with others 10 Enjoyment of life 10 What TIME of day is your pain at its worst? daytime and night Sleep (in general) Poor  Pain is worse with: walking and standing Pain improves with: rest and medication Relief from Meds: 4  Mobility use a cane ability to climb steps?  yes do you drive?  yes  Function disabled: date disabled 2008 I need assistance with the following:  meal prep, household duties and shopping  Neuro/Psych weakness numbness tingling trouble walking  Prior Studies Any changes since last visit?  no  Physicians involved in your care Any changes since last visit?  no   Family History  Problem Relation Age of Onset  . Heart attack Father   . Heart disease Father   . Heart failure Mother   . Pneumonia Mother   . Hypertension Mother   . Heart disease Mother   . Stroke Maternal Grandfather   . Hypertension Maternal Grandfather   . Asthma Paternal Uncle     PAT UNCLES  . Heart attack Paternal Uncle    History   Social History  . Marital Status: Married    Spouse Name: N/A    Number of Children: N/A  . Years of Education: N/A   Social History Main Topics    . Smoking status: Never Smoker   . Smokeless tobacco: Never Used  . Alcohol Use: No  . Drug Use: No  . Sexually Active: No   Other Topics Concern  . None   Social History Narrative  . None   Past Surgical History  Procedure Date  . Cataract extraction   . Cardiovascular stress test 09/07/05    Nuclear, was negative  . Breast lumpectomy 2003    radiation on right  . Appendectomy 1987    AT TAH  . Total abdominal hysterectomy 1987    LSO, APPENDECTOMY  . Pelvic laparoscopy 1989    RSO, LYSIS OF ADHESIONS  . Oophorectomy     LSO -RSO  . Back surgery     Fusion  . Ankle surgery    Past Medical History  Diagnosis Date  . Asthma     PFTs, February, 2011, moderate obstructive disease with response to bronchodilators, normal lung volumes, moderate reduction in diffusing capacity  . Obstructive airway disease   . Hyperlipidemia   . Hypertension   . Hypothyroidism     Patient has had in the past that she does not need treatment  . CAD (coronary artery disease)     90% distal LAD in the past  /   nuclear, 2008, no ischemia, ejection fraction 70%  . Atrial septal aneurysm     Echo, 2008  .  Pinched nerve     lower back  . Kyphoscoliosis   . Shortness of breath   . Endometriosis 1989    RIGHT TUBE  . Endometriosis 1987    LEFT TUBE/OVARY W FOCAL IN-SITU ENDOMETRIAL ADENOCARCINOMA  . Cancer 2002    DUCTAL CIS--S/P LUMPECTOMY, RADIATION AND 6 WEEKS OF TAMOXIFEN  . Ejection fraction     EF 60%, echo, October, 2008  . Elevated CPK     January, 2014  . D-dimer, elevated     January, 2014   BP 142/71  Pulse 76  Resp 14  Ht 4\' 11"  (1.499 m)  Wt 157 lb 9.6 oz (71.487 kg)  BMI 31.83 kg/m2  SpO2 97%   Review of Systems  Musculoskeletal: Positive for gait problem.  Neurological: Positive for weakness and numbness.       Tingling   All other systems reviewed and are negative.       Objective:   Physical Exam Constitutional: She is oriented to person, place, and  time. She appears well-developed and well-nourished.  HENT:  Head: Normocephalic.  Musculoskeletal: She exhibits tenderness.  Neurological: She is alert and oriented to person, place, and time.  Skin: Skin is warm and dry.  Psychiatric: She has a normal mood and affect.  Symmetric normal motor tone is noted throughout. Normal muscle bulk. Muscle testing reveals 5/5 muscle strength of the upper extremity, and 5/5 of the lower extremity, except right iliopsoas 4/5 with pain. Full range of motion in upper and lower extremities, except right hip, restriction into IR and Ext, with pain. ROM of spine is severely restricted.  DTR in the upper and lower extremity are present and symmetric 2+. No clonus is noted.  Patient arises from chair with difficulty.Wide based gait with extreme kyphotic posture, almost in a 90 degree angle of her upper body .  Severe thoracolumbar scoliosis.        Assessment & Plan:  This is a 62 year old female with  1. Chronic low back pain, status post posterior fusion in the lumbar spine  2. Severe scoliosis  3. Migraines  4. CAD  5. depression  Plan :  Continue with medication, refilled pain medication today. Continue with walking as complains allow. Walking or exercising in the pool would be beneficial for the patient, but she has no access to a pool[place] Wanted to prescribe aquatic PT, but patient denies. Suggested seeing a pain psychologist to help her to cope with her situation, but patient denies.  Follow up in 1 month.

## 2012-05-14 NOTE — Patient Instructions (Signed)
Try to stay as active as pain permits. 

## 2012-05-23 ENCOUNTER — Telehealth: Payer: Self-pay | Admitting: Endocrinology

## 2012-05-23 ENCOUNTER — Other Ambulatory Visit: Payer: Self-pay | Admitting: Internal Medicine

## 2012-05-23 ENCOUNTER — Encounter: Payer: Self-pay | Admitting: Internal Medicine

## 2012-05-23 DIAGNOSIS — E039 Hypothyroidism, unspecified: Secondary | ICD-10-CM

## 2012-05-23 MED ORDER — LEVOTHYROXINE SODIUM 100 MCG PO TABS: 100.0000 ug | ORAL_TABLET | Freq: Every day | ORAL | Status: DC

## 2012-05-23 NOTE — Telephone Encounter (Signed)
PATIENT NOTIFIED OF INSTRUCTIONS PER DR. ELLISON OF LEVOTHYROXINE. PATIENT WILL WAIT AND COME IN FOR LAB WORK WHEN HER APPT. WITH DR. Everardo All ON 2/21.

## 2012-05-23 NOTE — Progress Notes (Signed)
Pt called that she started to feel tired again, just as before starting the thyroid hh and she is wondering if we can increase her dose further. Since she has been ~3 weeks on the Levothyroxine, let's have her return next week for labs and see if needs adjustment. She will then have an appt with Dr. Everardo All on 02/20 for f/u.

## 2012-05-23 NOTE — Telephone Encounter (Signed)
Please increase to 100 mcg/day. i have sent a prescription to your pharmacy i'll see you next time.

## 2012-05-23 NOTE — Addendum Note (Signed)
Addended by: Romero Belling on: 05/23/2012 02:21 PM   Modules accepted: Orders

## 2012-06-14 ENCOUNTER — Encounter: Payer: Self-pay | Admitting: Physical Medicine and Rehabilitation

## 2012-06-14 ENCOUNTER — Encounter
Payer: Medicare Other | Attending: Physical Medicine and Rehabilitation | Admitting: Physical Medicine and Rehabilitation

## 2012-06-14 VITALS — BP 140/51 | HR 79 | Resp 14 | Ht 60.0 in | Wt 154.0 lb

## 2012-06-14 DIAGNOSIS — F3289 Other specified depressive episodes: Secondary | ICD-10-CM | POA: Insufficient documentation

## 2012-06-14 DIAGNOSIS — G43909 Migraine, unspecified, not intractable, without status migrainosus: Secondary | ICD-10-CM | POA: Insufficient documentation

## 2012-06-14 DIAGNOSIS — M545 Low back pain, unspecified: Secondary | ICD-10-CM | POA: Insufficient documentation

## 2012-06-14 DIAGNOSIS — G8929 Other chronic pain: Secondary | ICD-10-CM | POA: Insufficient documentation

## 2012-06-14 DIAGNOSIS — I2510 Atherosclerotic heart disease of native coronary artery without angina pectoris: Secondary | ICD-10-CM | POA: Insufficient documentation

## 2012-06-14 DIAGNOSIS — Z981 Arthrodesis status: Secondary | ICD-10-CM | POA: Insufficient documentation

## 2012-06-14 DIAGNOSIS — M4120 Other idiopathic scoliosis, site unspecified: Secondary | ICD-10-CM | POA: Insufficient documentation

## 2012-06-14 DIAGNOSIS — M47817 Spondylosis without myelopathy or radiculopathy, lumbosacral region: Secondary | ICD-10-CM

## 2012-06-14 DIAGNOSIS — F329 Major depressive disorder, single episode, unspecified: Secondary | ICD-10-CM | POA: Insufficient documentation

## 2012-06-14 MED ORDER — HYDROCODONE-ACETAMINOPHEN 10-325 MG PO TABS: 1.0000 | ORAL_TABLET | Freq: Three times a day (TID) | ORAL | Status: DC | PRN

## 2012-06-14 MED ORDER — MORPHINE SULFATE ER 30 MG PO TBCR: 30.0000 mg | EXTENDED_RELEASE_TABLET | Freq: Every day | ORAL | Status: DC

## 2012-06-14 NOTE — Progress Notes (Signed)
Subjective:    Patient ID: Hannah Scott, female    DOB: 11-Aug-1950, 62 y.o.   MRN: [id number]  HPI The patient complains about chronic low back pain pain which radiates into right LE. The patient also complains about numbness and tingling, intermittendly after prolonged standing.  The problem has been stable . Hx of PSF, Hx of severe scoliosis, is not considering further surgery at this point.  Patient has seen endocrinologist , her TSH was 87, she is taking Synthroid right now, she states,that she is seeing her endocrinologist next week for a follow up.  Pain Inventory Average Pain 8 Pain Right Now 7 My pain is constant, burning, tingling and aching  In the last 24 hours, has pain interfered with the following? General activity 10 Relation with others 10 Enjoyment of life 10 What TIME of day is your pain at its worst? all the time Sleep (in general) Poor  Pain is worse with: walking and standing Pain improves with: rest and medication Relief from Meds: 5  Mobility use a cane ability to climb steps?  yes do you drive?  yes  Function disabled: date disabled 2008 I need assistance with the following:  meal prep, household duties and shopping  Neuro/Psych weakness numbness tingling trouble walking depression  Prior Studies Any changes since last visit?  no  Physicians involved in your care Any changes since last visit?  no   Family History  Problem Relation Age of Onset  . Heart attack Father   . Heart disease Father   . Heart failure Mother   . Pneumonia Mother   . Hypertension Mother   . Heart disease Mother   . Stroke Maternal Grandfather   . Hypertension Maternal Grandfather   . Asthma Paternal Uncle     PAT UNCLES  . Heart attack Paternal Uncle    History   Social History  . Marital Status: Married    Spouse Name: N/A    Number of Children: N/A  . Years of Education: N/A   Social History Main Topics  . Smoking status: Never Smoker   . Smokeless  tobacco: Never Used  . Alcohol Use: No  . Drug Use: No  . Sexually Active: No   Other Topics Concern  . None   Social History Narrative  . None   Past Surgical History  Procedure Laterality Date  . Cataract extraction    . Cardiovascular stress test  09/07/05    Nuclear, was negative  . Breast lumpectomy  2003    radiation on right  . Appendectomy  1987    AT TAH  . Total abdominal hysterectomy  1987    LSO, APPENDECTOMY  . Pelvic laparoscopy  1989    RSO, LYSIS OF ADHESIONS  . Oophorectomy      LSO -RSO  . Back surgery      Fusion  . Ankle surgery     Past Medical History  Diagnosis Date  . Asthma     PFTs, February, 2011, moderate obstructive disease with response to bronchodilators, normal lung volumes, moderate reduction in diffusing capacity  . Obstructive airway disease   . Hyperlipidemia   . Hypertension   . Hypothyroidism     Patient has had in the past that she does not need treatment  . CAD (coronary artery disease)     90% distal LAD in the past  /   nuclear, 2008, no ischemia, ejection fraction 70%  . Atrial septal aneurysm  Echo, 2008  . Pinched nerve     lower back  . Kyphoscoliosis   . Shortness of breath   . Endometriosis 1989    RIGHT TUBE  . Endometriosis 1987    LEFT TUBE/OVARY W FOCAL IN-SITU ENDOMETRIAL ADENOCARCINOMA  . Cancer 2002    DUCTAL CIS--S/P LUMPECTOMY, RADIATION AND 6 WEEKS OF TAMOXIFEN  . Ejection fraction     EF 60%, echo, October, 2008  . Elevated CPK     January, 2014  . D-dimer, elevated     January, 2014   BP 140/51  Pulse 79  Resp 14  Ht 5' (1.524 m)  Wt 154 lb (69.854 kg)  BMI 30.08 kg/m2  SpO2 91%      Review of Systems  Musculoskeletal: Positive for back pain and gait problem.  Neurological: Positive for weakness and numbness.  Psychiatric/Behavioral: Positive for dysphoric mood.  All other systems reviewed and are negative.       Objective:   Physical Exam Constitutional: She is oriented to  person, place, and time. She appears well-developed and well-nourished.  HENT:  Head: Normocephalic.  Musculoskeletal: She exhibits tenderness.  Neurological: She is alert and oriented to person, place, and time.  Skin: Skin is warm and dry.  Psychiatric: She has a normal mood and affect.  Symmetric normal motor tone is noted throughout. Normal muscle bulk. Muscle testing reveals 5/5 muscle strength of the upper extremity, and 5/5 of the lower extremity, except right iliopsoas 4/5 with pain. Full range of motion in upper and lower extremities, except right hip, restriction into IR and Ext, with pain. ROM of spine is severely restricted.  DTR in the upper and lower extremity are present and symmetric 2+. No clonus is noted.  Patient arises from chair with difficulty.Wide based gait with extreme kyphotic posture, almost in a 90 degree angle of her upper body .  Severe thoracolumbar scoliosis.        Assessment & Plan:  This is a 62 year old female with  1. Chronic low back pain, status post posterior fusion in the lumbar spine  2. Severe scoliosis  3. Migraines  4. CAD  5. depression  Plan :  Continue with medication, refilled pain medication today. Continue with walking as complains allow. Walking or exercising in the pool would be beneficial for the patient, but she has no access to a pool[place] Wanted to prescribe aquatic PT, but patient denies. Suggested seeing a pain psychologist to help her to cope with her situation, but patient denies.  Follow up in 1 month.

## 2012-06-14 NOTE — Patient Instructions (Signed)
Stay as active as pain permits 

## 2012-06-15 ENCOUNTER — Ambulatory Visit (INDEPENDENT_AMBULATORY_CARE_PROVIDER_SITE_OTHER): Payer: Medicare Other | Admitting: Endocrinology

## 2012-06-15 VITALS — BP 134/80 | HR 78 | Wt 151.0 lb

## 2012-06-15 DIAGNOSIS — E039 Hypothyroidism, unspecified: Secondary | ICD-10-CM

## 2012-06-15 LAB — TSH: TSH: 0.96 u[IU]/mL (ref 0.35–5.50)

## 2012-06-15 NOTE — Patient Instructions (Addendum)
A thyroid blood test is being requested for you today.  We'll contact you with results. Please come back for a follow-up appointment in 2 months.

## 2012-06-15 NOTE — Progress Notes (Signed)
Subjective:    Patient ID: Hannah Scott, female    DOB: Apr 27, 1950, 62 y.o.   MRN: [id number]  HPI Pt had i-131 rx for hyperthyroidism, due to multinodular goiter, in September, 2013.  In early 2014, TSH was high, and synthroid was started.  Since then, she feels much better in general.   Past Medical History  Diagnosis Date  . Asthma     PFTs, February, 2011, moderate obstructive disease with response to bronchodilators, normal lung volumes, moderate reduction in diffusing capacity  . Obstructive airway disease   . Hyperlipidemia   . Hypertension   . Hypothyroidism     Patient has had in the past that she does not need treatment  . CAD (coronary artery disease)     90% distal LAD in the past  /   nuclear, 2008, no ischemia, ejection fraction 70%  . Atrial septal aneurysm     Echo, 2008  . Pinched nerve     lower back  . Kyphoscoliosis   . Shortness of breath   . Endometriosis 1989    RIGHT TUBE  . Endometriosis 1987    LEFT TUBE/OVARY W FOCAL IN-SITU ENDOMETRIAL ADENOCARCINOMA  . Cancer 2002    DUCTAL CIS--S/P LUMPECTOMY, RADIATION AND 6 WEEKS OF TAMOXIFEN  . Ejection fraction     EF 60%, echo, October, 2008  . Elevated CPK     January, 2014  . D-dimer, elevated     January, 2014    Past Surgical History  Procedure Laterality Date  . Cataract extraction    . Cardiovascular stress test  09/07/05    Nuclear, was negative  . Breast lumpectomy  2003    radiation on right  . Appendectomy  1987    AT TAH  . Total abdominal hysterectomy  1987    LSO, APPENDECTOMY  . Pelvic laparoscopy  1989    RSO, LYSIS OF ADHESIONS  . Oophorectomy      LSO -RSO  . Back surgery      Fusion  . Ankle surgery      History   Social History  . Marital Status: Married    Spouse Name: N/A    Number of Children: N/A  . Years of Education: N/A   Occupational History  . Not on file.   Social History Main Topics  . Smoking status: Never Smoker   . Smokeless tobacco: Never Used  .  Alcohol Use: No  . Drug Use: No  . Sexually Active: No   Other Topics Concern  . Not on file   Social History Narrative  . No narrative on file    Current Outpatient Prescriptions on File Prior to Visit  Medication Sig Dispense Refill  . albuterol (PROAIR HFA) 108 (90 BASE) MCG/ACT inhaler Inhale 1-2 puffs into the lungs every 6 (six) hours as needed.  1 Inhaler  6  . amLODipine (NORVASC) 2.5 MG tablet TAKE ONE TABLET BY MOUTH EVERY DAY  90 tablet  1  . aspirin 81 MG tablet Take 81 mg by mouth daily.        [name] atorvastatin (LIPITOR) 20 MG tablet Take 1 tablet (20 mg total) by mouth daily.  90 tablet  1  . beclomethasone (QVAR) 80 MCG/ACT inhaler Inhale 2 puffs into the lungs every 12 (twelve) hours. Gargle and swallow after use  8.7 Inhaler  11  . benazepril (LOTENSIN) 40 MG tablet TAKE ONE-HALF TABLET BY MOUTH EVERY DAY  45 tablet  1  .  HYDROcodone-acetaminophen (NORCO) 10-325 MG per tablet Take 1 tablet by mouth every 8 (eight) hours as needed for pain.  90 tablet  0  . ipratropium-albuterol (DUONEB) 0.5-2.5 (3) MG/3ML SOLN Take 3 mLs by nebulization every 4 (four) hours as needed. Must not be qid unless during exacerbation. Dx 493.90  360 mL  0  . levothyroxine (SYNTHROID, LEVOTHROID) 100 MCG tablet Take 1 tablet (100 mcg total) by mouth daily.  30 tablet  0  . metoprolol tartrate (LOPRESSOR) 25 MG tablet TAKE ONE TABLET BY MOUTH TWICE DAILY  180 tablet  1  . mometasone-formoterol (DULERA) 200-5 MCG/ACT AERO Inhale 2 puffs into the lungs 2 (two) times daily.  8.8 g  5  . morphine (MS CONTIN) 30 MG 12 hr tablet Take 1 tablet (30 mg total) by mouth at bedtime.  30 tablet  0  . sertraline (ZOLOFT) 50 MG tablet 1 qd  30 tablet  5   No current facility-administered medications on file prior to visit.    Allergies  Allergen Reactions  . Fluticasone-Salmeterol     REACTION: headache  Never tried Symbicort or Dulera   Family History  Problem Relation Age of Onset  . Heart attack  Father   . Heart disease Father   . Heart failure Mother   . Pneumonia Mother   . Hypertension Mother   . Heart disease Mother   . Stroke Maternal Grandfather   . Hypertension Maternal Grandfather   . Asthma Paternal Uncle     PAT UNCLES  . Heart attack Paternal Uncle    BP 134/80  Pulse 78  Wt 151 lb (68.493 kg)  BMI 29.49 kg/m2  SpO2 98%  Review of Systems She has lost a few lbs.     Objective:   Physical Exam VITAL SIGNS:  See vs page GENERAL: no distress Neck: thyroid is 2x normal size, with multinodular surface.  Lab Results  Component Value Date   TSH 0.96 06/15/2012      Assessment & Plan:  Post-i-131 hypothyroidism, well-replaced

## 2012-06-19 ENCOUNTER — Other Ambulatory Visit: Payer: Self-pay | Admitting: Endocrinology

## 2012-07-12 ENCOUNTER — Encounter
Payer: Medicare Other | Attending: Physical Medicine and Rehabilitation | Admitting: Physical Medicine and Rehabilitation

## 2012-07-12 ENCOUNTER — Encounter: Payer: Self-pay | Admitting: Physical Medicine and Rehabilitation

## 2012-07-12 VITALS — BP 135/57 | HR 61 | Resp 14 | Ht 60.0 in | Wt 153.0 lb

## 2012-07-12 DIAGNOSIS — Z981 Arthrodesis status: Secondary | ICD-10-CM | POA: Insufficient documentation

## 2012-07-12 DIAGNOSIS — F329 Major depressive disorder, single episode, unspecified: Secondary | ICD-10-CM | POA: Insufficient documentation

## 2012-07-12 DIAGNOSIS — M545 Low back pain, unspecified: Secondary | ICD-10-CM | POA: Insufficient documentation

## 2012-07-12 DIAGNOSIS — M961 Postlaminectomy syndrome, not elsewhere classified: Secondary | ICD-10-CM

## 2012-07-12 DIAGNOSIS — J45909 Unspecified asthma, uncomplicated: Secondary | ICD-10-CM | POA: Insufficient documentation

## 2012-07-12 DIAGNOSIS — G43909 Migraine, unspecified, not intractable, without status migrainosus: Secondary | ICD-10-CM | POA: Insufficient documentation

## 2012-07-12 DIAGNOSIS — F3289 Other specified depressive episodes: Secondary | ICD-10-CM | POA: Insufficient documentation

## 2012-07-12 DIAGNOSIS — I2510 Atherosclerotic heart disease of native coronary artery without angina pectoris: Secondary | ICD-10-CM | POA: Insufficient documentation

## 2012-07-12 DIAGNOSIS — M4120 Other idiopathic scoliosis, site unspecified: Secondary | ICD-10-CM

## 2012-07-12 DIAGNOSIS — R209 Unspecified disturbances of skin sensation: Secondary | ICD-10-CM | POA: Insufficient documentation

## 2012-07-12 DIAGNOSIS — E039 Hypothyroidism, unspecified: Secondary | ICD-10-CM | POA: Insufficient documentation

## 2012-07-12 DIAGNOSIS — G8929 Other chronic pain: Secondary | ICD-10-CM | POA: Insufficient documentation

## 2012-07-12 DIAGNOSIS — I10 Essential (primary) hypertension: Secondary | ICD-10-CM | POA: Insufficient documentation

## 2012-07-12 DIAGNOSIS — E785 Hyperlipidemia, unspecified: Secondary | ICD-10-CM | POA: Insufficient documentation

## 2012-07-12 MED ORDER — MORPHINE SULFATE ER 30 MG PO TBCR: 30.0000 mg | EXTENDED_RELEASE_TABLET | Freq: Every day | ORAL | Status: DC

## 2012-07-12 MED ORDER — HYDROCODONE-ACETAMINOPHEN 10-325 MG PO TABS: 1.0000 | ORAL_TABLET | Freq: Three times a day (TID) | ORAL | Status: DC | PRN

## 2012-07-12 NOTE — Progress Notes (Signed)
Subjective:    Patient ID: Hannah Scott, female    DOB: Jan 31, 1951, 62 y.o.   MRN: [id number]  HPI The patient complains about chronic low back pain pain which radiates into right LE. The patient also complains about numbness and tingling, intermittendly after prolonged standing.  The problem has been stable . Hx of PSF, Hx of severe scoliosis, is not considering further surgery at this point.  Patient has seen endocrinologist , she is taking Synthroid right now, she states,that she is seeing her endocrinologist next week for a follow up.  Pain Inventory Average Pain 7 Pain Right Now 6 My pain is constant, burning, tingling and aching  In the last 24 hours, has pain interfered with the following? General activity 10 Relation with others 10 Enjoyment of life 10 What TIME of day is your pain at its worst? all the time Sleep (in general) Poor  Pain is worse with: walking and standing Pain improves with: rest and medication Relief from Meds: 5  Mobility use a cane ability to climb steps?  yes do you drive?  yes  Function disabled: date disabled 08 I need assistance with the following:  meal prep, household duties and shopping  Neuro/Psych weakness numbness tingling trouble walking  Prior Studies Any changes since last visit?  no  Physicians involved in your care Any changes since last visit?  no   Family History  Problem Relation Age of Onset  . Heart attack Father   . Heart disease Father   . Heart failure Mother   . Pneumonia Mother   . Hypertension Mother   . Heart disease Mother   . Stroke Maternal Grandfather   . Hypertension Maternal Grandfather   . Asthma Paternal Uncle     PAT UNCLES  . Heart attack Paternal Uncle    History   Social History  . Marital Status: Married    Spouse Name: N/A    Number of Children: N/A  . Years of Education: N/A   Social History Main Topics  . Smoking status: Never Smoker   . Smokeless tobacco: Never Used  .  Alcohol Use: No  . Drug Use: No  . Sexually Active: No   Other Topics Concern  . None   Social History Narrative  . None   Past Surgical History  Procedure Laterality Date  . Cataract extraction    . Cardiovascular stress test  09/07/05    Nuclear, was negative  . Breast lumpectomy  2003    radiation on right  . Appendectomy  1987    AT TAH  . Total abdominal hysterectomy  1987    LSO, APPENDECTOMY  . Pelvic laparoscopy  1989    RSO, LYSIS OF ADHESIONS  . Oophorectomy      LSO -RSO  . Back surgery      Fusion  . Ankle surgery     Past Medical History  Diagnosis Date  . Asthma     PFTs, February, 2011, moderate obstructive disease with response to bronchodilators, normal lung volumes, moderate reduction in diffusing capacity  . Obstructive airway disease   . Hyperlipidemia   . Hypertension   . Hypothyroidism     Patient has had in the past that she does not need treatment  . CAD (coronary artery disease)     90% distal LAD in the past  /   nuclear, 2008, no ischemia, ejection fraction 70%  . Atrial septal aneurysm     Echo, 2008  .  Pinched nerve     lower back  . Kyphoscoliosis   . Shortness of breath   . Endometriosis 1989    RIGHT TUBE  . Endometriosis 1987    LEFT TUBE/OVARY W FOCAL IN-SITU ENDOMETRIAL ADENOCARCINOMA  . Cancer 2002    DUCTAL CIS--S/P LUMPECTOMY, RADIATION AND 6 WEEKS OF TAMOXIFEN  . Ejection fraction     EF 60%, echo, October, 2008  . Elevated CPK     January, 2014  . D-dimer, elevated     January, 2014   BP 135/57  Pulse 61  Resp 14  Ht 5' (1.524 m)  Wt 153 lb (69.4 kg)  BMI 29.88 kg/m2  SpO2 94%     Review of Systems  Musculoskeletal: Positive for back pain and gait problem.  Neurological: Positive for weakness and numbness.  All other systems reviewed and are negative.       Objective:   Physical Exam Constitutional: She is oriented to person, place, and time. She appears well-developed and well-nourished.  HENT:   Head: Normocephalic.  Musculoskeletal: She exhibits tenderness.  Neurological: She is alert and oriented to person, place, and time.  Skin: Skin is warm and dry.  Psychiatric: She has a normal mood and affect.  Symmetric normal motor tone is noted throughout. Normal muscle bulk. Muscle testing reveals 5/5 muscle strength of the upper extremity, and 5/5 of the lower extremity, except right iliopsoas 4/5 with pain. Full range of motion in upper and lower extremities, except right hip, restriction into IR and Ext, with pain. ROM of spine is severely restricted.  DTR in the upper and lower extremity are present and symmetric 2+. No clonus is noted.  Patient arises from chair with difficulty.Wide based gait with extreme kyphotic posture, almost in a 90 degree angle of her upper body .  Severe thoracolumbar scoliosis.        Assessment & Plan:  This is a 62 year old female with  1. Chronic low back pain, status post posterior fusion in the lumbar spine  2. Severe scoliosis  3. Migraines  4. CAD  5. depression  Plan :  Continue with medication, refilled pain medication today. Continue with walking as complains allow. Walking or exercising in the pool would be beneficial for the patient, but she has no access to a pool[place] Wanted to prescribe aquatic PT, but patient denies. Suggested seeing a pain psychologist to help her to cope with her situation, but patient denies.  Follow up in 1 month.

## 2012-07-12 NOTE — Patient Instructions (Signed)
Stay as active as tolerated. 

## 2012-07-22 ENCOUNTER — Other Ambulatory Visit: Payer: Self-pay | Admitting: Endocrinology

## 2012-07-23 ENCOUNTER — Other Ambulatory Visit: Payer: Self-pay | Admitting: *Deleted

## 2012-07-23 MED ORDER — LEVOTHYROXINE SODIUM 100 MCG PO TABS: 100.0000 ug | ORAL_TABLET | Freq: Every day | ORAL | Status: DC

## 2012-08-09 ENCOUNTER — Encounter: Payer: Self-pay | Admitting: Physical Medicine and Rehabilitation

## 2012-08-09 ENCOUNTER — Encounter
Payer: Medicare Other | Attending: Physical Medicine and Rehabilitation | Admitting: Physical Medicine and Rehabilitation

## 2012-08-09 VITALS — BP 146/52 | HR 75 | Resp 16 | Ht 60.0 in | Wt 156.4 lb

## 2012-08-09 DIAGNOSIS — G43909 Migraine, unspecified, not intractable, without status migrainosus: Secondary | ICD-10-CM | POA: Insufficient documentation

## 2012-08-09 DIAGNOSIS — F329 Major depressive disorder, single episode, unspecified: Secondary | ICD-10-CM | POA: Insufficient documentation

## 2012-08-09 DIAGNOSIS — I2510 Atherosclerotic heart disease of native coronary artery without angina pectoris: Secondary | ICD-10-CM | POA: Insufficient documentation

## 2012-08-09 DIAGNOSIS — M545 Low back pain, unspecified: Secondary | ICD-10-CM | POA: Insufficient documentation

## 2012-08-09 DIAGNOSIS — G8929 Other chronic pain: Secondary | ICD-10-CM | POA: Insufficient documentation

## 2012-08-09 DIAGNOSIS — Z981 Arthrodesis status: Secondary | ICD-10-CM | POA: Insufficient documentation

## 2012-08-09 DIAGNOSIS — F3289 Other specified depressive episodes: Secondary | ICD-10-CM | POA: Insufficient documentation

## 2012-08-09 DIAGNOSIS — M4120 Other idiopathic scoliosis, site unspecified: Secondary | ICD-10-CM

## 2012-08-09 DIAGNOSIS — M961 Postlaminectomy syndrome, not elsewhere classified: Secondary | ICD-10-CM

## 2012-08-09 MED ORDER — MORPHINE SULFATE ER 30 MG PO TBCR: 30.0000 mg | EXTENDED_RELEASE_TABLET | Freq: Every day | ORAL | Status: DC

## 2012-08-09 MED ORDER — HYDROCODONE-ACETAMINOPHEN 10-325 MG PO TABS: 1.0000 | ORAL_TABLET | Freq: Three times a day (TID) | ORAL | Status: DC | PRN

## 2012-08-09 NOTE — Progress Notes (Signed)
Subjective:    Patient ID: Hannah Scott, female    DOB: [birth year], 62 y.o.   MRN: [id number]  HPI The patient complains about chronic low back pain pain which radiates into right LE. The patient also complains about numbness and tingling, intermittendly after prolonged standing.  The problem has been stable . Hx of PSF, Hx of severe scoliosis, is not considering further surgery at this point.  Patient has seen endocrinologist , she is taking Synthroid right now, she states,that she is seeing her endocrinologist for a follow up.  Pain Inventory Average Pain 7 Pain Right Now 6 My pain is constant, burning, tingling and aching  In the last 24 hours, has pain interfered with the following? General activity 10 Relation with others 10 Enjoyment of life 10 What TIME of day is your pain at its worst? n/a Sleep (in general) n/a  Pain is worse with: walking and standing Pain improves with: rest and medication Relief from Meds: 4  Mobility use a cane ability to climb steps?  yes do you drive?  yes  Function disabled: date disabled 2008 I need assistance with the following:  meal prep, household duties and shopping  Neuro/Psych weakness numbness tingling trouble walking  Prior Studies Any changes since last visit?  no  Physicians involved in your care Any changes since last visit?  no   Family History  Problem Relation Age of Onset  . Heart attack Father   . Heart disease Father   . Heart failure Mother   . Pneumonia Mother   . Hypertension Mother   . Heart disease Mother   . Stroke Maternal Grandfather   . Hypertension Maternal Grandfather   . Asthma Paternal Uncle     PAT UNCLES  . Heart attack Paternal Uncle    History   Social History  . Marital Status: Married    Spouse Name: N/A    Number of Children: N/A  . Years of Education: N/A   Social History Main Topics  . Smoking status: Never Smoker   . Smokeless tobacco: Never Used  . Alcohol Use: No  .  Drug Use: No  . Sexually Active: No   Other Topics Concern  . None   Social History Narrative  . None   Past Surgical History  Procedure Laterality Date  . Cataract extraction    . Cardiovascular stress test  09/07/05    Nuclear, was negative  . Breast lumpectomy  2003    radiation on right  . Appendectomy  1987    AT TAH  . Total abdominal hysterectomy  1987    LSO, APPENDECTOMY  . Pelvic laparoscopy  1989    RSO, LYSIS OF ADHESIONS  . Oophorectomy      LSO -RSO  . Back surgery      Fusion  . Ankle surgery     Past Medical History  Diagnosis Date  . Asthma     PFTs, February, 2011, moderate obstructive disease with response to bronchodilators, normal lung volumes, moderate reduction in diffusing capacity  . Obstructive airway disease   . Hyperlipidemia   . Hypertension   . Hypothyroidism     Patient has had in the past that she does not need treatment  . CAD (coronary artery disease)     90% distal LAD in the past  /   nuclear, 2008, no ischemia, ejection fraction 70%  . Atrial septal aneurysm     Echo, 2008  . Pinched nerve  lower back  . Kyphoscoliosis   . Shortness of breath   . Endometriosis 1989    RIGHT TUBE  . Endometriosis 1987    LEFT TUBE/OVARY W FOCAL IN-SITU ENDOMETRIAL ADENOCARCINOMA  . Cancer 2002    DUCTAL CIS--S/P LUMPECTOMY, RADIATION AND 6 WEEKS OF TAMOXIFEN  . Ejection fraction     EF 60%, echo, October, 2008  . Elevated CPK     January, 2014  . D-dimer, elevated     January, 2014   BP 146/52  Pulse 75  Resp 16  Ht 5' (1.524 m)  Wt 156 lb 6.4 oz (70.943 kg)  BMI 30.54 kg/m2  SpO2 96%       Review of Systems  Musculoskeletal: Positive for gait problem.  Neurological: Positive for weakness and numbness.  All other systems reviewed and are negative.       Objective:   Physical Exam Constitutional: She is oriented to person, place, and time. She appears well-developed and well-nourished.  HENT:  Head: Normocephalic.   Musculoskeletal: She exhibits tenderness.  Neurological: She is alert and oriented to person, place, and time.  Skin: Skin is warm and dry.  Psychiatric: She has a normal mood and affect.  Symmetric normal motor tone is noted throughout. Normal muscle bulk. Muscle testing reveals 5/5 muscle strength of the upper extremity, and 5/5 of the lower extremity, except right iliopsoas 4/5 with pain. Full range of motion in upper and lower extremities, except right hip, restriction into IR and Ext, with pain. ROM of spine is severely restricted.  DTR in the upper and lower extremity are present and symmetric 2+. No clonus is noted.  Patient arises from chair with difficulty.Wide based gait with extreme kyphotic posture, almost in a 90 degree angle of her upper body .  Severe thoracolumbar scoliosis.        Assessment & Plan:  This is a 62 year old female with  1. Chronic low back pain, status post posterior fusion in the lumbar spine  2. Severe scoliosis  3. Migraines  4. CAD  5. depression  Plan :  Continue with medication, refilled pain medication today. Continue with walking as complains allow. Walking or exercising in the pool would be beneficial for the patient, but she has no access to a pool. [place] Wanted to prescribe aquatic PT, but patient denies. Suggested seeing a pain psychologist to help her to cope with her situation, but patient denies.  Follow up in 1 month.

## 2012-08-09 NOTE — Patient Instructions (Signed)
Stay as active as tolerated. 

## 2012-08-15 ENCOUNTER — Encounter: Payer: Self-pay | Admitting: Endocrinology

## 2012-08-15 ENCOUNTER — Ambulatory Visit (INDEPENDENT_AMBULATORY_CARE_PROVIDER_SITE_OTHER): Payer: Medicare Other | Admitting: Endocrinology

## 2012-08-15 VITALS — BP 126/80 | HR 75 | Wt 155.0 lb

## 2012-08-15 DIAGNOSIS — E039 Hypothyroidism, unspecified: Secondary | ICD-10-CM

## 2012-08-15 MED ORDER — LEVOTHYROXINE SODIUM 50 MCG PO TABS: 50.0000 ug | ORAL_TABLET | Freq: Every day | ORAL | Status: DC

## 2012-08-15 NOTE — Patient Instructions (Addendum)
A thyroid blood test is being requested for you today.  We'll contact you with results. Please come back for a follow-up appointment in 4 months.    most of the time, a "lumpy thyroid" will eventually become overactive again.  this is usually a slow process, happening over the span of many years.

## 2012-08-15 NOTE — Progress Notes (Signed)
Subjective:    Patient ID: Hannah Scott, female    DOB: 1951-02-14, 62 y.o.   MRN: [id number]  HPI Pt had i-131 rx for hyperthyroidism, due to multinodular goiter, in September, 2013.  In early 2014, TSH was high, and synthroid was started.  Since then, she feels much better in general. pt states she feels well in general, except for hair loss.   Past Medical History  Diagnosis Date  . Asthma     PFTs, February, 2011, moderate obstructive disease with response to bronchodilators, normal lung volumes, moderate reduction in diffusing capacity  . Obstructive airway disease   . Hyperlipidemia   . Hypertension   . Hypothyroidism     Patient has had in the past that she does not need treatment  . CAD (coronary artery disease)     90% distal LAD in the past  /   nuclear, 2008, no ischemia, ejection fraction 70%  . Atrial septal aneurysm     Echo, 2008  . Pinched nerve     lower back  . Kyphoscoliosis   . Shortness of breath   . Endometriosis 1989    RIGHT TUBE  . Endometriosis 1987    LEFT TUBE/OVARY W FOCAL IN-SITU ENDOMETRIAL ADENOCARCINOMA  . Cancer 2002    DUCTAL CIS--S/P LUMPECTOMY, RADIATION AND 6 WEEKS OF TAMOXIFEN  . Ejection fraction     EF 60%, echo, October, 2008  . Elevated CPK     January, 2014  . D-dimer, elevated     January, 2014    Past Surgical History  Procedure Laterality Date  . Cataract extraction    . Cardiovascular stress test  09/07/05    Nuclear, was negative  . Breast lumpectomy  2003    radiation on right  . Appendectomy  1987    AT TAH  . Total abdominal hysterectomy  1987    LSO, APPENDECTOMY  . Pelvic laparoscopy  1989    RSO, LYSIS OF ADHESIONS  . Oophorectomy      LSO -RSO  . Back surgery      Fusion  . Ankle surgery      History   Social History  . Marital Status: Married    Spouse Name: N/A    Number of Children: N/A  . Years of Education: N/A   Occupational History  . Not on file.   Social History Main Topics  . Smoking  status: Never Smoker   . Smokeless tobacco: Never Used  . Alcohol Use: No  . Drug Use: No  . Sexually Active: No   Other Topics Concern  . Not on file   Social History Narrative  . No narrative on file    Current Outpatient Prescriptions on File Prior to Visit  Medication Sig Dispense Refill  . albuterol (PROAIR HFA) 108 (90 BASE) MCG/ACT inhaler Inhale 1-2 puffs into the lungs every 6 (six) hours as needed.  1 Inhaler  6  . amLODipine (NORVASC) 2.5 MG tablet TAKE ONE TABLET BY MOUTH EVERY DAY  90 tablet  1  . aspirin 81 MG tablet Take 81 mg by mouth daily.        [name] atorvastatin (LIPITOR) 20 MG tablet Take 1 tablet (20 mg total) by mouth daily.  90 tablet  1  . beclomethasone (QVAR) 80 MCG/ACT inhaler Inhale 2 puffs into the lungs every 12 (twelve) hours. Gargle and swallow after use  8.7 Inhaler  11  . benazepril (LOTENSIN) 40 MG tablet TAKE ONE-HALF TABLET  BY MOUTH EVERY DAY  45 tablet  1  . HYDROcodone-acetaminophen (NORCO) 10-325 MG per tablet Take 1 tablet by mouth every 8 (eight) hours as needed for pain.  90 tablet  0  . ipratropium-albuterol (DUONEB) 0.5-2.5 (3) MG/3ML SOLN Take 3 mLs by nebulization every 4 (four) hours as needed. Must not be qid unless during exacerbation. Dx 493.90  360 mL  0  . metoprolol tartrate (LOPRESSOR) 25 MG tablet TAKE ONE TABLET BY MOUTH TWICE DAILY  180 tablet  1  . mometasone-formoterol (DULERA) 200-5 MCG/ACT AERO Inhale 2 puffs into the lungs 2 (two) times daily.  8.8 g  5  . morphine (MS CONTIN) 30 MG 12 hr tablet Take 1 tablet (30 mg total) by mouth at bedtime.  30 tablet  0  . sertraline (ZOLOFT) 50 MG tablet 1 qd  30 tablet  5   No current facility-administered medications on file prior to visit.    Allergies  Allergen Reactions  . Fluticasone-Salmeterol     REACTION: headache  Never tried Symbicort or Dulera    Family History  Problem Relation Age of Onset  . Heart attack Father   . Heart disease Father   . Heart failure Mother    . Pneumonia Mother   . Hypertension Mother   . Heart disease Mother   . Stroke Maternal Grandfather   . Hypertension Maternal Grandfather   . Asthma Paternal Uncle     PAT UNCLES  . Heart attack Paternal Uncle     BP 126/80  Pulse 75  Wt 155 lb (70.308 kg)  BMI 30.27 kg/m2  SpO2 98%  Review of Systems Denies weight change    Objective:   Physical Exam VITAL SIGNS:  See vs page GENERAL: no distress Neck: thyroid is slightly enlarged, (L>R), with multinodular surface.    Lab Results  Component Value Date   TSH 0.17* 08/15/2012      Assessment & Plan:  Post-i-131 hypothyroidism.  She needs reduced rx.  She is at risk for recurrent hyperthyroidism.

## 2012-09-06 ENCOUNTER — Encounter: Payer: Medicare Other | Admitting: Physical Medicine and Rehabilitation

## 2012-09-10 ENCOUNTER — Encounter: Payer: Self-pay | Admitting: Physical Medicine and Rehabilitation

## 2012-09-10 ENCOUNTER — Encounter
Payer: Medicare Other | Attending: Physical Medicine and Rehabilitation | Admitting: Physical Medicine and Rehabilitation

## 2012-09-10 VITALS — BP 131/77 | HR 53 | Resp 14 | Ht 60.0 in | Wt 156.0 lb

## 2012-09-10 DIAGNOSIS — F3289 Other specified depressive episodes: Secondary | ICD-10-CM | POA: Insufficient documentation

## 2012-09-10 DIAGNOSIS — M4120 Other idiopathic scoliosis, site unspecified: Secondary | ICD-10-CM | POA: Insufficient documentation

## 2012-09-10 DIAGNOSIS — M961 Postlaminectomy syndrome, not elsewhere classified: Secondary | ICD-10-CM

## 2012-09-10 DIAGNOSIS — M79609 Pain in unspecified limb: Secondary | ICD-10-CM | POA: Insufficient documentation

## 2012-09-10 DIAGNOSIS — M4000 Postural kyphosis, site unspecified: Secondary | ICD-10-CM | POA: Insufficient documentation

## 2012-09-10 DIAGNOSIS — M545 Low back pain, unspecified: Secondary | ICD-10-CM | POA: Insufficient documentation

## 2012-09-10 DIAGNOSIS — F329 Major depressive disorder, single episode, unspecified: Secondary | ICD-10-CM | POA: Insufficient documentation

## 2012-09-10 DIAGNOSIS — R209 Unspecified disturbances of skin sensation: Secondary | ICD-10-CM | POA: Insufficient documentation

## 2012-09-10 DIAGNOSIS — I2510 Atherosclerotic heart disease of native coronary artery without angina pectoris: Secondary | ICD-10-CM | POA: Insufficient documentation

## 2012-09-10 DIAGNOSIS — Z981 Arthrodesis status: Secondary | ICD-10-CM | POA: Insufficient documentation

## 2012-09-10 DIAGNOSIS — G43909 Migraine, unspecified, not intractable, without status migrainosus: Secondary | ICD-10-CM | POA: Insufficient documentation

## 2012-09-10 DIAGNOSIS — G8929 Other chronic pain: Secondary | ICD-10-CM | POA: Insufficient documentation

## 2012-09-10 MED ORDER — HYDROCODONE-ACETAMINOPHEN 10-325 MG PO TABS: 1.0000 | ORAL_TABLET | Freq: Three times a day (TID) | ORAL | Status: DC | PRN

## 2012-09-10 MED ORDER — MORPHINE SULFATE ER 30 MG PO TBCR: 30.0000 mg | EXTENDED_RELEASE_TABLET | Freq: Every day | ORAL | Status: DC

## 2012-09-10 NOTE — Progress Notes (Signed)
Subjective:    Patient ID: Hannah Scott, female    DOB: [birth year], 62 y.o.   MRN: [id number]  HPI The patient complains about chronic low back pain pain which radiates into right LE. The patient also complains about numbness and tingling, intermittendly after prolonged standing.  The problem has been stable . Hx of PSF, Hx of severe scoliosis, is not considering further surgery at this point.  Patient has seen endocrinologist , she is taking Synthroid right now, she states,that she is seeing her endocrinologist for a follow up.  Pain Inventory Average Pain 8 Pain Right Now 7 My pain is constant, burning, tingling and aching  In the last 24 hours, has pain interfered with the following? General activity 10 Relation with others 10 Enjoyment of life 10 What TIME of day is your pain at its worst? constant Sleep (in general) Poor  Pain is worse with: walking and standing Pain improves with: rest and medication Relief from Meds: 4  Mobility use a cane ability to climb steps?  yes do you drive?  yes Do you have any goals in this area?  no  Function disabled: date disabled 2008 I need assistance with the following:  meal prep, household duties and shopping Do you have any goals in this area?  no  Neuro/Psych weakness numbness tingling  Prior Studies Any changes since last visit?  no  Physicians involved in your care Any changes since last visit?  no   Family History  Problem Relation Age of Onset  . Heart attack Father   . Heart disease Father   . Heart failure Mother   . Pneumonia Mother   . Hypertension Mother   . Heart disease Mother   . Stroke Maternal Grandfather   . Hypertension Maternal Grandfather   . Asthma Paternal Uncle     PAT UNCLES  . Heart attack Paternal Uncle    History   Social History  . Marital Status: Married    Spouse Name: N/A    Number of Children: N/A  . Years of Education: N/A   Social History Main Topics  . Smoking status:  Never Smoker   . Smokeless tobacco: Never Used  . Alcohol Use: No  . Drug Use: No  . Sexually Active: No   Other Topics Concern  . None   Social History Narrative  . None   Past Surgical History  Procedure Laterality Date  . Cataract extraction    . Cardiovascular stress test  09/07/05    Nuclear, was negative  . Breast lumpectomy  2003    radiation on right  . Appendectomy  1987    [facility]  . Total abdominal hysterectomy  1987    LSO, APPENDECTOMY  . Pelvic laparoscopy  1989    RSO, LYSIS OF ADHESIONS  . Oophorectomy      LSO -RSO  . Back surgery      Fusion  . Ankle surgery     Past Medical History  Diagnosis Date  . Asthma     PFTs, February, 2011, moderate obstructive disease with response to bronchodilators, normal lung volumes, moderate reduction in diffusing capacity  . Obstructive airway disease   . Hyperlipidemia   . Hypertension   . Hypothyroidism     Patient has had in the past that she does not need treatment  . CAD (coronary artery disease)     90% distal LAD in the past  /   nuclear, 2008, no ischemia, ejection fraction  70%  . Atrial septal aneurysm     Echo, 2008  . Pinched nerve     lower back  . Kyphoscoliosis   . Shortness of breath   . Endometriosis 1989    RIGHT TUBE  . Endometriosis 1987    LEFT TUBE/OVARY W FOCAL IN-SITU ENDOMETRIAL ADENOCARCINOMA  . Cancer 2002    DUCTAL CIS--S/P LUMPECTOMY, RADIATION AND 6 WEEKS OF TAMOXIFEN  . Ejection fraction     EF 60%, echo, October, 2008  . Elevated CPK     January, 2014  . D-dimer, elevated     January, 2014   BP 131/77  Pulse 53  Resp 14  Ht 5' (1.524 m)  Wt 156 lb (70.761 kg)  BMI 30.47 kg/m2  SpO2 95%     Review of Systems  Musculoskeletal: Positive for back pain.  Neurological: Positive for weakness and numbness.  All other systems reviewed and are negative.       Objective:   Physical Exam Constitutional: She is oriented to person, place, and time. She appears  well-developed and well-nourished.  HENT:  Head: Normocephalic.  Musculoskeletal: She exhibits tenderness.  Neurological: She is alert and oriented to person, place, and time.  Skin: Skin is warm and dry.  Psychiatric: She has a normal mood and affect.  Symmetric normal motor tone is noted throughout. Normal muscle bulk. Muscle testing reveals 5/5 muscle strength of the upper extremity, and 5/5 of the lower extremity, except right iliopsoas 4/5 with pain. Full range of motion in upper and lower extremities, except right hip, restriction into IR and Ext, with pain. ROM of spine is severely restricted.  DTR in the upper and lower extremity are present and symmetric 2+. No clonus is noted.  Patient arises from chair with difficulty.Wide based gait with extreme kyphotic posture, almost in a 90 degree angle of her upper body .  Severe thoracolumbar scoliosis.        Assessment & Plan:  This is a 62 year old female with  1. Chronic low back pain, status post posterior fusion in the lumbar spine  2. Severe scoliosis, kyphosis  3. Migraines  4. CAD  5. depression  Plan :  Continue with medication, refilled pain medication today. Continue with walking as complains allow. Walking or exercising in the pool would be beneficial for the patient, but she has no access to a pool[place] Wanted to prescribe aquatic PT, but patient denies. Suggested seeing a pain psychologist to help her to cope with her situation, but patient denies.  Follow up in 1 month.

## 2012-09-10 NOTE — Patient Instructions (Signed)
Stay as active as tolerated. 

## 2012-10-08 ENCOUNTER — Encounter: Payer: Medicare Other | Admitting: Physical Medicine and Rehabilitation

## 2012-10-08 ENCOUNTER — Telehealth: Payer: Self-pay

## 2012-10-08 MED ORDER — MORPHINE SULFATE ER 30 MG PO TBCR: 30.0000 mg | EXTENDED_RELEASE_TABLET | Freq: Every day | ORAL | Status: DC

## 2012-10-08 MED ORDER — HYDROCODONE-ACETAMINOPHEN 10-325 MG PO TABS: 1.0000 | ORAL_TABLET | Freq: Three times a day (TID) | ORAL | Status: DC | PRN

## 2012-10-08 NOTE — Telephone Encounter (Signed)
Patient called and was unable to keep appointment due to her husband being in the hospital.  She would like to know if she can just get her script and reschedule.  Scripts printed patient aware and will make a follow up appointment.

## 2012-10-12 ENCOUNTER — Other Ambulatory Visit: Payer: Self-pay | Admitting: Internal Medicine

## 2012-10-12 NOTE — Telephone Encounter (Signed)
Refill done.  

## 2012-11-01 ENCOUNTER — Encounter (HOSPITAL_BASED_OUTPATIENT_CLINIC_OR_DEPARTMENT_OTHER): Payer: Medicare Other | Attending: Internal Medicine

## 2012-11-01 DIAGNOSIS — S91009A Unspecified open wound, unspecified ankle, initial encounter: Secondary | ICD-10-CM | POA: Insufficient documentation

## 2012-11-01 DIAGNOSIS — S81009A Unspecified open wound, unspecified knee, initial encounter: Secondary | ICD-10-CM | POA: Insufficient documentation

## 2012-11-01 DIAGNOSIS — B9689 Other specified bacterial agents as the cause of diseases classified elsewhere: Secondary | ICD-10-CM | POA: Insufficient documentation

## 2012-11-01 DIAGNOSIS — X58XXXA Exposure to other specified factors, initial encounter: Secondary | ICD-10-CM | POA: Insufficient documentation

## 2012-11-01 NOTE — Progress Notes (Signed)
[facility]  NAMEMIRELLA, Hannah Scott                   ACCOUNT NO.:  [id number]  MEDICAL RECORD NO.:  [id number]      DATE OF BIRTH:  [birth year]  PHYSICIAN:  Maxwell Caul, M.D. VISIT DATE:  11/01/2012                                  OFFICE VISIT   LOCATION:  [facility].  HISTORY OF PRESENT ILLNESS:  Ms. 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  She went to an Urgent Care.  This was cauterized to control the bleeding and she tells me she required 9 sutures. Unfortunately things did not go well.  She was put on antibiotics.  Ten days later, the stitches were removed.  The culture was done, that grew 3 organisms including methicillin-sensitive Staph aureus, Burkholderia complex and a Bacillus species.  I am not completely certain about her antibiotic duration; however, it appears that she has had courses of Keflex, Cipro, and now was on Levaquin.  There is still a fair amount of discomfort in the area and this has not been close to healing.  She was recently prescribed gentamicin topical; however, she felt this made the erythema worse.  Therefore, she discontinued this.  She has not been systemically unwell.  She has had no fever, chills.  The area has not been x-rayed.  She is not a diabetic and she does not have a relevant wound history.  There is no suggestion of PAD.  PAST MEDICAL HISTORY:  Includes asthma, hypertension, severe lumbar scoliosis, thyroid treatment, depression, hyperlipidemia.  PAST SURGICAL HISTORY:  Left ankle ligament repair, appendectomy, hysterectomy, and bilateral cataracts.  MEDICATION LIST:  Reviewed.  She is on amlodipine 2.5 daily, metoprolol 25 b.i.d., Lotensin 20 daily, QVAR 80 mg 2 puffs twice daily, aspirin 81 daily, morphine 30 mg nightly, hydrocodone APAP 2-3 times a day, Lipitor 20 daily, ProAir HFA p.r.n., Atrovent nebulizers p.r.n.,  Synthroid 50 mcg daily, Zoloft 50 daily, and Levaquin 750 mg daily.  PHYSICAL EXAMINATION:  VITAL SIGNS:  Temperature is 98.1, pulse 92, respirations 18, blood pressure 131/80. GENERAL:  She does not look systemically unwell. RESPIRATORY:  Severe kyphoscoliosis; however, the air entry bilaterally is quite normal.  There is no wheezing or crackles. CARDIAC:  Heart sounds are normal.  There is no signs of congestive heart failure. EXTREMITIES:  Left ABI is normal at 1.27.  Dorsalis pedis pulse is easily palpable and capillary refill time appears to be normal. WOUND EXAM:  The areas over the left Achilles measures 4.5 x 0.7 x 0.2. This was covered with a tough greenish adherent eschar and biofilm. This underwent debridement with a curette.  The surface eschar and biofilm were almost completely removed.  There was still some adherent eschar present, postdebridement.  The surrounding tissue had a pinkish color to it, although any degree of infection here appears to be under control.  On the lateral aspect of the wound, some surrounding tissue edema, which will need to be controlled.  IMPRESSIONS:  Traumatic wound over the Achilles area as noted above. Debridement as noted above.  I extended her Levaquin, so she will complete 2 weeks of treatment.  No additional cultures were done.  We  dressed this with Santyl and alginate, and put her in a Kerlix, Coban wrap.  We will have her back for a nurse check early next week and I will see her again in a week's time.          ______________________________ Maxwell Caul, M.D.     MGR/MEDQ  D:  11/01/2012  T:  11/01/2012  Job:  [id number]

## 2012-11-06 ENCOUNTER — Encounter
Payer: Medicare Other | Attending: Physical Medicine and Rehabilitation | Admitting: Physical Medicine and Rehabilitation

## 2012-11-06 ENCOUNTER — Encounter: Payer: Self-pay | Admitting: Physical Medicine and Rehabilitation

## 2012-11-06 VITALS — BP 137/74 | HR 96 | Resp 14 | Ht 60.0 in | Wt 163.4 lb

## 2012-11-06 DIAGNOSIS — Z981 Arthrodesis status: Secondary | ICD-10-CM | POA: Insufficient documentation

## 2012-11-06 DIAGNOSIS — F329 Major depressive disorder, single episode, unspecified: Secondary | ICD-10-CM | POA: Insufficient documentation

## 2012-11-06 DIAGNOSIS — F3289 Other specified depressive episodes: Secondary | ICD-10-CM | POA: Insufficient documentation

## 2012-11-06 DIAGNOSIS — M545 Low back pain, unspecified: Secondary | ICD-10-CM | POA: Insufficient documentation

## 2012-11-06 DIAGNOSIS — M4120 Other idiopathic scoliosis, site unspecified: Secondary | ICD-10-CM

## 2012-11-06 DIAGNOSIS — G43909 Migraine, unspecified, not intractable, without status migrainosus: Secondary | ICD-10-CM | POA: Insufficient documentation

## 2012-11-06 DIAGNOSIS — E785 Hyperlipidemia, unspecified: Secondary | ICD-10-CM | POA: Insufficient documentation

## 2012-11-06 DIAGNOSIS — E039 Hypothyroidism, unspecified: Secondary | ICD-10-CM | POA: Insufficient documentation

## 2012-11-06 DIAGNOSIS — I10 Essential (primary) hypertension: Secondary | ICD-10-CM | POA: Insufficient documentation

## 2012-11-06 DIAGNOSIS — Z79899 Other long term (current) drug therapy: Secondary | ICD-10-CM | POA: Insufficient documentation

## 2012-11-06 DIAGNOSIS — I2510 Atherosclerotic heart disease of native coronary artery without angina pectoris: Secondary | ICD-10-CM | POA: Insufficient documentation

## 2012-11-06 DIAGNOSIS — J45909 Unspecified asthma, uncomplicated: Secondary | ICD-10-CM | POA: Insufficient documentation

## 2012-11-06 DIAGNOSIS — G8929 Other chronic pain: Secondary | ICD-10-CM | POA: Insufficient documentation

## 2012-11-06 DIAGNOSIS — M961 Postlaminectomy syndrome, not elsewhere classified: Secondary | ICD-10-CM

## 2012-11-06 DIAGNOSIS — Z5181 Encounter for therapeutic drug level monitoring: Secondary | ICD-10-CM

## 2012-11-06 MED ORDER — MORPHINE SULFATE ER 30 MG PO TBCR: 30.0000 mg | EXTENDED_RELEASE_TABLET | Freq: Every day | ORAL | Status: DC

## 2012-11-06 MED ORDER — HYDROCODONE-ACETAMINOPHEN 10-325 MG PO TABS: 1.0000 | ORAL_TABLET | Freq: Three times a day (TID) | ORAL | Status: DC | PRN

## 2012-11-06 NOTE — Patient Instructions (Addendum)
Continue with staying as active as tolerated, you can try Arnica cream for your joint pain

## 2012-11-06 NOTE — Progress Notes (Signed)
Subjective:    Patient ID: Hannah Scott, female    DOB: 1950/09/20, 62 y.o.   MRN: [id number]  HPI The patient complains about chronic low back pain pain which radiates into right LE. The patient also complains about numbness and tingling, intermittendly after prolonged standing.  The problem has been stable . Hx of PSF, Hx of severe scoliosis, is not considering further surgery at this point.  Patient has seen endocrinologist , she is taking Synthroid right now, she states,that she is seeing her endocrinologist for a follow up. Patient reports that a cinderblock fell on her left ankle on June the7th, she got 9 stitches, which got infected she is still on Ax and is following up with wound care at [facility] Pain Inventory Average Pain 8 Pain Right Now 7 My pain is constant, burning, tingling and aching  In the last 24 hours, has pain interfered with the following? General activity 10 Relation with others 10 Enjoyment of life 10 What TIME of day is your pain at its worst? all Sleep (in general) Poor  Pain is worse with: walking and standing Pain improves with: rest and medication Relief from Meds: 4  Mobility use a cane ability to climb steps?  yes do you drive?  yes  Function disabled: date disabled 2008 I need assistance with the following:  meal prep, household duties and shopping  Neuro/Psych weakness numbness tingling trouble walking  Prior Studies Any changes since last visit?  no  Physicians involved in your care Any changes since last visit?  no   Family History  Problem Relation Age of Onset  . Heart attack Father   . Heart disease Father   . Heart failure Mother   . Pneumonia Mother   . Hypertension Mother   . Heart disease Mother   . Stroke Maternal Grandfather   . Hypertension Maternal Grandfather   . Asthma Paternal Uncle     PAT UNCLES  . Heart attack Paternal Uncle    History   Social History  . Marital Status: Married    Spouse Name: N/A   Number of Children: N/A  . Years of Education: N/A   Social History Main Topics  . Smoking status: Never Smoker   . Smokeless tobacco: Never Used  . Alcohol Use: No  . Drug Use: No  . Sexually Active: No   Other Topics Concern  . None   Social History Narrative  . None   Past Surgical History  Procedure Laterality Date  . Cataract extraction    . Cardiovascular stress test  09/07/05    Nuclear, was negative  . Breast lumpectomy  2003    radiation on right  . Appendectomy  1987    [facility]  . Total abdominal hysterectomy  1987    LSO, APPENDECTOMY  . Pelvic laparoscopy  1989    RSO, LYSIS OF ADHESIONS  . Oophorectomy      LSO -RSO  . Back surgery      Fusion  . Ankle surgery     Past Medical History  Diagnosis Date  . Asthma     PFTs, February, 2011, moderate obstructive disease with response to bronchodilators, normal lung volumes, moderate reduction in diffusing capacity  . Obstructive airway disease   . Hyperlipidemia   . Hypertension   . Hypothyroidism     Patient has had in the past that she does not need treatment  . CAD (coronary artery disease)     90% distal LAD  in the past  /   nuclear, 2008, no ischemia, ejection fraction 70%  . Atrial septal aneurysm     Echo, 2008  . Pinched nerve     lower back  . Kyphoscoliosis   . Shortness of breath   . Endometriosis 1989    RIGHT TUBE  . Endometriosis 1987    LEFT TUBE/OVARY W FOCAL IN-SITU ENDOMETRIAL ADENOCARCINOMA  . Cancer 2002    DUCTAL CIS--S/P LUMPECTOMY, RADIATION AND 6 WEEKS OF TAMOXIFEN  . Ejection fraction     EF 60%, echo, October, 2008  . Elevated CPK     January, 2014  . D-dimer, elevated     January, 2014   BP 137/74  Pulse 96  Resp 14  Ht 5' (1.524 m)  Wt 163 lb 6.4 oz (74.118 kg)  BMI 31.91 kg/m2  SpO2 93%    Review of Systems  Musculoskeletal: Positive for gait problem.  All other systems reviewed and are negative.       Objective:   Physical Exam Constitutional: She  is oriented to person, place, and time. She appears well-developed and well-nourished.  HENT:  Head: Normocephalic.  Musculoskeletal: She exhibits tenderness.  Neurological: She is alert and oriented to person, place, and time.  Skin: Skin is warm and dry.  Psychiatric: She has a normal mood and affect.  Symmetric normal motor tone is noted throughout. Normal muscle bulk. Muscle testing reveals 5/5 muscle strength of the upper extremity, and 5/5 of the lower extremity, except right iliopsoas 4/5 with pain. Full range of motion in upper and lower extremities, except right hip, restriction into IR and Ext, with pain. ROM of spine is severely restricted.  DTR in the upper and lower extremity are present and symmetric 2+. No clonus is noted.  Patient arises from chair with difficulty.Wide based gait with extreme kyphotic posture, almost in a 90 degree angle of her upper body .  Severe thoracolumbar scoliosis.  Left lower leg wrapped      Assessment & Plan:  This is a 62 year old female with  1. Chronic low back pain, status post posterior fusion in the lumbar spine  2. Severe scoliosis, kyphosis  3. Migraines  4. CAD  5. depression  6. Wound /sutured,after a cinderblock fell on her left ankle on June the7th, she got 9 stitches, which got infected she is still on Ax and is following up with wound care at [facility]. Plan :  Continue with medication, refilled pain medication today. Continue with walking as complains allow. Walking or exercising in the pool would be beneficial for the patient, but she has no access to a pool. [place] Wanted to prescribe aquatic PT, but patient denies. Suggested seeing a pain psychologist to help her to cope with her situation, but patient denies.  Follow up in 1 month.

## 2012-11-09 ENCOUNTER — Other Ambulatory Visit: Payer: Self-pay | Admitting: Internal Medicine

## 2012-11-14 ENCOUNTER — Telehealth: Payer: Self-pay | Admitting: *Deleted

## 2012-11-14 DIAGNOSIS — E042 Nontoxic multinodular goiter: Secondary | ICD-10-CM

## 2012-11-14 NOTE — Telephone Encounter (Signed)
Patient called to see if she can have her labs done now (tsh). Patient stated that she is feeling off. Please advise?

## 2012-11-14 NOTE — Telephone Encounter (Signed)
Ok, i ordered 

## 2012-11-15 ENCOUNTER — Other Ambulatory Visit: Payer: Self-pay | Admitting: Endocrinology

## 2012-11-15 ENCOUNTER — Other Ambulatory Visit (INDEPENDENT_AMBULATORY_CARE_PROVIDER_SITE_OTHER): Payer: Medicare Other

## 2012-11-15 DIAGNOSIS — E039 Hypothyroidism, unspecified: Secondary | ICD-10-CM

## 2012-11-15 MED ORDER — LEVOTHYROXINE SODIUM 75 MCG PO TABS: 75.0000 ug | ORAL_TABLET | Freq: Every day | ORAL | Status: DC

## 2012-11-15 NOTE — Telephone Encounter (Signed)
Pt advised.

## 2012-12-03 ENCOUNTER — Other Ambulatory Visit: Payer: Self-pay | Admitting: Internal Medicine

## 2012-12-05 ENCOUNTER — Encounter
Payer: Medicare Other | Attending: Physical Medicine and Rehabilitation | Admitting: Physical Medicine and Rehabilitation

## 2012-12-05 ENCOUNTER — Encounter: Payer: Self-pay | Admitting: Physical Medicine and Rehabilitation

## 2012-12-05 VITALS — BP 120/61 | HR 84 | Resp 14 | Ht 60.0 in | Wt 161.2 lb

## 2012-12-05 DIAGNOSIS — G43909 Migraine, unspecified, not intractable, without status migrainosus: Secondary | ICD-10-CM | POA: Insufficient documentation

## 2012-12-05 DIAGNOSIS — R209 Unspecified disturbances of skin sensation: Secondary | ICD-10-CM | POA: Insufficient documentation

## 2012-12-05 DIAGNOSIS — Z981 Arthrodesis status: Secondary | ICD-10-CM | POA: Insufficient documentation

## 2012-12-05 DIAGNOSIS — Z853 Personal history of malignant neoplasm of breast: Secondary | ICD-10-CM | POA: Insufficient documentation

## 2012-12-05 DIAGNOSIS — I10 Essential (primary) hypertension: Secondary | ICD-10-CM | POA: Insufficient documentation

## 2012-12-05 DIAGNOSIS — F329 Major depressive disorder, single episode, unspecified: Secondary | ICD-10-CM | POA: Insufficient documentation

## 2012-12-05 DIAGNOSIS — M961 Postlaminectomy syndrome, not elsewhere classified: Secondary | ICD-10-CM

## 2012-12-05 DIAGNOSIS — M545 Low back pain, unspecified: Secondary | ICD-10-CM | POA: Insufficient documentation

## 2012-12-05 DIAGNOSIS — I2510 Atherosclerotic heart disease of native coronary artery without angina pectoris: Secondary | ICD-10-CM | POA: Insufficient documentation

## 2012-12-05 DIAGNOSIS — M4120 Other idiopathic scoliosis, site unspecified: Secondary | ICD-10-CM | POA: Insufficient documentation

## 2012-12-05 DIAGNOSIS — E785 Hyperlipidemia, unspecified: Secondary | ICD-10-CM | POA: Insufficient documentation

## 2012-12-05 DIAGNOSIS — M4000 Postural kyphosis, site unspecified: Secondary | ICD-10-CM | POA: Insufficient documentation

## 2012-12-05 DIAGNOSIS — E039 Hypothyroidism, unspecified: Secondary | ICD-10-CM | POA: Insufficient documentation

## 2012-12-05 DIAGNOSIS — G8929 Other chronic pain: Secondary | ICD-10-CM | POA: Insufficient documentation

## 2012-12-05 DIAGNOSIS — F3289 Other specified depressive episodes: Secondary | ICD-10-CM | POA: Insufficient documentation

## 2012-12-05 DIAGNOSIS — J45909 Unspecified asthma, uncomplicated: Secondary | ICD-10-CM | POA: Insufficient documentation

## 2012-12-05 MED ORDER — HYDROCODONE-ACETAMINOPHEN 10-325 MG PO TABS: 1.0000 | ORAL_TABLET | Freq: Three times a day (TID) | ORAL | Status: DC | PRN

## 2012-12-05 MED ORDER — MORPHINE SULFATE ER 30 MG PO TBCR: 30.0000 mg | EXTENDED_RELEASE_TABLET | Freq: Every day | ORAL | Status: DC

## 2012-12-05 NOTE — Progress Notes (Signed)
Subjective:    Patient ID: Hannah Scott, female    DOB: May 05, 1950, 62 y.o.   MRN: [id number]  HPI The patient complains about chronic low back pain pain which radiates into right LE. The patient also complains about numbness and tingling, intermittendly after prolonged standing.  The problem has been stable . Hx of PSF, Hx of severe scoliosis, is not considering further surgery at this point.  Patient has seen endocrinologist , she is taking Synthroid right now, she states,that she is seeing her endocrinologist for a follow up in 2 weeks.  Patient reports that a cinderblock fell on her left ankle on June the7th, she got 9 stitches, which got infected was on Ax and following up with wound care at [facility], the wound/incision has finally healed.  Pain Inventory Average Pain 8 Pain Right Now 7 My pain is constant, burning, tingling and aching  In the last 24 hours, has pain interfered with the following? General activity 10 Relation with others 10 Enjoyment of life 10 What TIME of day is your pain at its worst? all day Sleep (in general) Poor  Pain is worse with: walking and standing Pain improves with: rest and medication Relief from Meds: 4  Mobility walk with assistance use a cane ability to climb steps?  yes do you drive?  yes  Function disabled: date disabled 2008 I need assistance with the following:  meal prep, household duties and shopping  Neuro/Psych weakness numbness tingling  Prior Studies Any changes since last visit?  no  Physicians involved in your care Any changes since last visit?  no   Family History  Problem Relation Age of Onset  . Heart attack Father   . Heart disease Father   . Heart failure Mother   . Pneumonia Mother   . Hypertension Mother   . Heart disease Mother   . Stroke Maternal Grandfather   . Hypertension Maternal Grandfather   . Asthma Paternal Uncle     PAT UNCLES  . Heart attack Paternal Uncle    History   Social History  .  Marital Status: Married    Spouse Name: N/A    Number of Children: N/A  . Years of Education: N/A   Social History Main Topics  . Smoking status: Never Smoker   . Smokeless tobacco: Never Used  . Alcohol Use: No  . Drug Use: No  . Sexual Activity: No   Other Topics Concern  . None   Social History Narrative  . None   Past Surgical History  Procedure Laterality Date  . Cataract extraction    . Cardiovascular stress test  09/07/05    Nuclear, was negative  . Breast lumpectomy  2003    radiation on right  . Appendectomy  1987    [facility]  . Total abdominal hysterectomy  1987    LSO, APPENDECTOMY  . Pelvic laparoscopy  1989    RSO, LYSIS OF ADHESIONS  . Oophorectomy      LSO -RSO  . Back surgery      Fusion  . Ankle surgery     Past Medical History  Diagnosis Date  . Asthma     PFTs, February, 2011, moderate obstructive disease with response to bronchodilators, normal lung volumes, moderate reduction in diffusing capacity  . Obstructive airway disease   . Hyperlipidemia   . Hypertension   . Hypothyroidism     Patient has had in the past that she does not need treatment  .  CAD (coronary artery disease)     90% distal LAD in the past  /   nuclear, 2008, no ischemia, ejection fraction 70%  . Atrial septal aneurysm     Echo, 2008  . Pinched nerve     lower back  . Kyphoscoliosis   . Shortness of breath   . Endometriosis 1989    RIGHT TUBE  . Endometriosis 1987    LEFT TUBE/OVARY W FOCAL IN-SITU ENDOMETRIAL ADENOCARCINOMA  . Cancer 2002    DUCTAL CIS--S/P LUMPECTOMY, RADIATION AND 6 WEEKS OF TAMOXIFEN  . Ejection fraction     EF 60%, echo, October, 2008  . Elevated CPK     January, 2014  . D-dimer, elevated     January, 2014   BP 120/61  Pulse 84  Resp 14  Ht 5' (1.524 m)  Wt 161 lb 3.2 oz (73.12 kg)  BMI 31.48 kg/m2  SpO2 95%     Review of Systems  Neurological: Positive for weakness and numbness.       Tingling  All other systems reviewed and are  negative.       Objective:   Physical Exam Constitutional: She is oriented to person, place, and time. She appears well-developed and well-nourished.  HENT:  Head: Normocephalic.  Musculoskeletal: She exhibits tenderness.  Neurological: She is alert and oriented to person, place, and time.  Skin: Skin is warm and dry.  Psychiatric: She has a normal mood and affect.  Symmetric normal motor tone is noted throughout. Normal muscle bulk. Muscle testing reveals 5/5 muscle strength of the upper extremity, and 5/5 of the lower extremity, except right iliopsoas 4/5 with pain. Full range of motion in upper and lower extremities, except right hip, restriction into IR and Ext, with pain. ROM of spine is severely restricted.  DTR in the upper and lower extremity are present and symmetric 2+. No clonus is noted.  Patient arises from chair with difficulty.Wide based gait with extreme kyphotic posture, almost in a 90 degree angle of her upper body .  Severe thoracolumbar scoliosis.  Left lower leg , 6cm long wound/incision, has healed, mild redness around it, no increase in temperature         Assessment & Plan:  This is a 62 year old female with  1. Chronic low back pain, status post posterior fusion in the lumbar spine  2. Severe scoliosis, kyphosis  3. Migraines  4. CAD  5. depression  6. Wound /sutured,after a cinderblock fell on her left ankle on June the7th, she got 9 stitches, which got infected she was on Ax and now finally the wound has healed.   Plan :  Continue with medication, refilled pain medication today. Continue with walking as complains allow. Walking or exercising in the pool would be beneficial for the patient, but she has no access to a pool[place] Wanted to prescribe aquatic PT, but patient denies. Suggested seeing a pain psychologist to help her to cope with her situation, but patient denies.  Follow up in 1 month.

## 2012-12-05 NOTE — Patient Instructions (Signed)
Stay as active as tolerated. 

## 2012-12-19 ENCOUNTER — Encounter: Payer: Self-pay | Admitting: Endocrinology

## 2012-12-19 ENCOUNTER — Ambulatory Visit (INDEPENDENT_AMBULATORY_CARE_PROVIDER_SITE_OTHER): Payer: Medicare Other | Admitting: Endocrinology

## 2012-12-19 VITALS — BP 136/80 | HR 80 | Wt 162.0 lb

## 2012-12-19 DIAGNOSIS — E039 Hypothyroidism, unspecified: Secondary | ICD-10-CM

## 2012-12-19 NOTE — Progress Notes (Signed)
Subjective:    Patient ID: Hannah Scott, female    DOB: 23-Dec-1950, 62 y.o.   MRN: [id number]  HPI Pt had i-131 rx for hyperthyroidism, due to multinodular goiter, in September, 2013.  In early 2014, TSH was high, and synthroid was started.  Last month, she developed weakness of the arms, and assoc depression.  She has TSH done, and synthroid was increased.  Since then, sxs are not improved.   Past Medical History  Diagnosis Date  . Asthma     PFTs, February, 2011, moderate obstructive disease with response to bronchodilators, normal lung volumes, moderate reduction in diffusing capacity  . Obstructive airway disease   . Hyperlipidemia   . Hypertension   . Hypothyroidism     Patient has had in the past that she does not need treatment  . CAD (coronary artery disease)     90% distal LAD in the past  /   nuclear, 2008, no ischemia, ejection fraction 70%  . Atrial septal aneurysm     Echo, 2008  . Pinched nerve     lower back  . Kyphoscoliosis   . Shortness of breath   . Endometriosis 1989    RIGHT TUBE  . Endometriosis 1987    LEFT TUBE/OVARY W FOCAL IN-SITU ENDOMETRIAL ADENOCARCINOMA  . Cancer 2002    DUCTAL CIS--S/P LUMPECTOMY, RADIATION AND 6 WEEKS OF TAMOXIFEN  . Ejection fraction     EF 60%, echo, October, 2008  . Elevated CPK     January, 2014  . D-dimer, elevated     January, 2014    Past Surgical History  Procedure Laterality Date  . Cataract extraction    . Cardiovascular stress test  09/07/05    Nuclear, was negative  . Breast lumpectomy  2003    radiation on right  . Appendectomy  1987    AT [facility]  . Total abdominal hysterectomy  1987    LSO, APPENDECTOMY  . Pelvic laparoscopy  1989    RSO, LYSIS OF ADHESIONS  . Oophorectomy      LSO -RSO  . Back surgery      Fusion  . Ankle surgery      History   Social History  . Marital Status: Married    Spouse Name: N/A    Number of Children: N/A  . Years of Education: N/A   Occupational History  . Not on  file.   Social History Main Topics  . Smoking status: Never Smoker   . Smokeless tobacco: Never Used  . Alcohol Use: No  . Drug Use: No  . Sexual Activity: No   Other Topics Concern  . Not on file   Social History Narrative  . No narrative on file    Current Outpatient Prescriptions on File Prior to Visit  Medication Sig Dispense Refill  . albuterol (PROAIR HFA) 108 (90 BASE) MCG/ACT inhaler Inhale 1-2 puffs into the lungs every 6 (six) hours as needed.  1 Inhaler  6  . amLODipine (NORVASC) 2.5 MG tablet TAKE ONE TABLET BY MOUTH EVERY DAY  90 tablet  1  . aspirin 81 MG tablet Take 81 mg by mouth daily.        [name] atorvastatin (LIPITOR) 20 MG tablet Take 1 tablet (20 mg total) by mouth daily.  90 tablet  1  . beclomethasone (QVAR) 80 MCG/ACT inhaler Inhale 2 puffs into the lungs every 12 (twelve) hours. Gargle and swallow after use  8.7 Inhaler  11  .  benazepril (LOTENSIN) 40 MG tablet TAKE ONE-HALF TABLET BY MOUTH EVERY DAY  45 tablet  1  . HYDROcodone-acetaminophen (NORCO) 10-325 MG per tablet Take 1 tablet by mouth every 8 (eight) hours as needed for pain.  90 tablet  0  . ipratropium-albuterol (DUONEB) 0.5-2.5 (3) MG/3ML SOLN Take 3 mLs by nebulization every 4 (four) hours as needed. Must not be qid unless during exacerbation. Dx 493.90  360 mL  0  . levothyroxine (SYNTHROID, LEVOTHROID) 75 MCG tablet Take 1 tablet (75 mcg total) by mouth daily before breakfast.  30 tablet  1  . metoprolol tartrate (LOPRESSOR) 25 MG tablet TAKE ONE TABLET BY MOUTH TWICE DAILY  180 tablet  1  . mometasone-formoterol (DULERA) 200-5 MCG/ACT AERO Inhale 2 puffs into the lungs 2 (two) times daily.  8.8 g  5  . morphine (MS CONTIN) 30 MG 12 hr tablet Take 1 tablet (30 mg total) by mouth at bedtime.  30 tablet  0  . sertraline (ZOLOFT) 50 MG tablet TAKE ONE TABLET BY MOUTH EVERY DAY  30 tablet  5   No current facility-administered medications on file prior to visit.    Allergies  Allergen Reactions  .  Fluticasone-Salmeterol     REACTION: headache  Never tried Symbicort or Dulera    Family History  Problem Relation Age of Onset  . Heart attack Father   . Heart disease Father   . Heart failure Mother   . Pneumonia Mother   . Hypertension Mother   . Heart disease Mother   . Stroke Maternal Grandfather   . Hypertension Maternal Grandfather   . Asthma Paternal Uncle     PAT UNCLES  . Heart attack Paternal Uncle     BP 136/80  Pulse 80  Wt 162 lb (73.483 kg)  BMI 31.64 kg/m2  SpO2 91%  Review of Systems She has fatigue and weight gain.  She has myalgias    Objective:   Physical Exam VITAL SIGNS:  See vs page GENERAL: no distress Neck: thyroid is slightly enlarged, (L>R), with multinodular surface.   Lab Results  Component Value Date   TSH 1.96 12/19/2012      Assessment & Plan:  Multinodular goiter, clinically unchanged Hypothyroidism, due to i-131 rx, well-replaced Weight gain, not thyroid-related

## 2012-12-19 NOTE — Patient Instructions (Addendum)
A thyroid blood test is being requested for you today.  We'll contact you with results. Please come back for a follow-up appointment in 3 months.    most of the time, a "lumpy thyroid" will eventually become overactive again.  this is usually a slow process, happening over the span of many years.

## 2012-12-25 ENCOUNTER — Encounter: Payer: Self-pay | Admitting: Endocrinology

## 2013-01-02 ENCOUNTER — Encounter
Payer: Medicare Other | Attending: Physical Medicine and Rehabilitation | Admitting: Physical Medicine and Rehabilitation

## 2013-01-02 ENCOUNTER — Encounter: Payer: Self-pay | Admitting: Physical Medicine and Rehabilitation

## 2013-01-02 VITALS — BP 150/70 | HR 84 | Resp 14 | Ht 60.0 in | Wt 163.0 lb

## 2013-01-02 DIAGNOSIS — F329 Major depressive disorder, single episode, unspecified: Secondary | ICD-10-CM | POA: Insufficient documentation

## 2013-01-02 DIAGNOSIS — M961 Postlaminectomy syndrome, not elsewhere classified: Secondary | ICD-10-CM

## 2013-01-02 DIAGNOSIS — J45909 Unspecified asthma, uncomplicated: Secondary | ICD-10-CM | POA: Insufficient documentation

## 2013-01-02 DIAGNOSIS — M545 Low back pain, unspecified: Secondary | ICD-10-CM | POA: Insufficient documentation

## 2013-01-02 DIAGNOSIS — G8929 Other chronic pain: Secondary | ICD-10-CM | POA: Insufficient documentation

## 2013-01-02 DIAGNOSIS — R209 Unspecified disturbances of skin sensation: Secondary | ICD-10-CM | POA: Insufficient documentation

## 2013-01-02 DIAGNOSIS — I10 Essential (primary) hypertension: Secondary | ICD-10-CM | POA: Insufficient documentation

## 2013-01-02 DIAGNOSIS — E039 Hypothyroidism, unspecified: Secondary | ICD-10-CM | POA: Insufficient documentation

## 2013-01-02 DIAGNOSIS — E785 Hyperlipidemia, unspecified: Secondary | ICD-10-CM | POA: Insufficient documentation

## 2013-01-02 DIAGNOSIS — M4120 Other idiopathic scoliosis, site unspecified: Secondary | ICD-10-CM | POA: Insufficient documentation

## 2013-01-02 DIAGNOSIS — G43909 Migraine, unspecified, not intractable, without status migrainosus: Secondary | ICD-10-CM | POA: Insufficient documentation

## 2013-01-02 DIAGNOSIS — F3289 Other specified depressive episodes: Secondary | ICD-10-CM | POA: Insufficient documentation

## 2013-01-02 DIAGNOSIS — M4000 Postural kyphosis, site unspecified: Secondary | ICD-10-CM | POA: Insufficient documentation

## 2013-01-02 DIAGNOSIS — Z79899 Other long term (current) drug therapy: Secondary | ICD-10-CM | POA: Insufficient documentation

## 2013-01-02 DIAGNOSIS — Z981 Arthrodesis status: Secondary | ICD-10-CM | POA: Insufficient documentation

## 2013-01-02 DIAGNOSIS — I2510 Atherosclerotic heart disease of native coronary artery without angina pectoris: Secondary | ICD-10-CM | POA: Insufficient documentation

## 2013-01-02 MED ORDER — MORPHINE SULFATE ER 30 MG PO TBCR: 30.0000 mg | EXTENDED_RELEASE_TABLET | Freq: Every day | ORAL | Status: DC

## 2013-01-02 MED ORDER — HYDROCODONE-ACETAMINOPHEN 10-325 MG PO TABS: 1.0000 | ORAL_TABLET | Freq: Three times a day (TID) | ORAL | Status: DC | PRN

## 2013-01-02 NOTE — Patient Instructions (Signed)
Stay as active as tolerated. 

## 2013-01-02 NOTE — Progress Notes (Signed)
Subjective:    Patient ID: Hannah Scott, female    DOB: [birth year], 62 y.o.   MRN: [id number]  HPI  The patient complains about chronic low back pain pain which radiates into right LE. The patient also complains about numbness and tingling, intermittendly after prolonged standing.  The problem has been stable . Hx of PSF, Hx of severe scoliosis, is not considering further surgery at this point.  Patient has seen endocrinologist , she is taking Synthroid right now, she states,that she is seeing her endocrinologist for a follow up in 2 weeks.  Patient reports that a cinderblock fell on her left ankle on June the7th, she got 9 stitches, which got infected was on Ax and following up with wound care at [facility], the wound/incision has finally healed.  Pain Inventory Average Pain 8 Pain Right Now 7 My pain is burning and aching  In the last 24 hours, has pain interfered with the following? General activity 10 Relation with others 10 Enjoyment of life 10 What TIME of day is your pain at its worst? all day Sleep (in general) Poor  Pain is worse with: walking and standing Pain improves with: rest and medication Relief from Meds: 4  Mobility walk with assistance use a cane ability to climb steps?  yes do you drive?  yes  Function disabled: date disabled 2008 I need assistance with the following:  meal prep, household duties and shopping  Neuro/Psych weakness numbness tingling trouble walking  Prior Studies Any changes since last visit?  no  Physicians involved in your care Any changes since last visit?  no   Family History  Problem Relation Age of Onset  . Heart attack Father   . Heart disease Father   . Heart failure Mother   . Pneumonia Mother   . Hypertension Mother   . Heart disease Mother   . Stroke Maternal Grandfather   . Hypertension Maternal Grandfather   . Asthma Paternal Uncle     PAT UNCLES  . Heart attack Paternal Uncle    History   Social History  .  Marital Status: Married    Spouse Name: N/A    Number of Children: N/A  . Years of Education: N/A   Social History Main Topics  . Smoking status: Never Smoker   . Smokeless tobacco: Never Used  . Alcohol Use: No  . Drug Use: No  . Sexual Activity: No   Other Topics Concern  . None   Social History Narrative  . None   Past Surgical History  Procedure Laterality Date  . Cataract extraction    . Cardiovascular stress test  09/07/05    Nuclear, was negative  . Breast lumpectomy  2003    radiation on right  . Appendectomy  1987    AT [facility]  . Total abdominal hysterectomy  1987    LSO, APPENDECTOMY  . Pelvic laparoscopy  1989    RSO, LYSIS OF ADHESIONS  . Oophorectomy      LSO -RSO  . Back surgery      Fusion  . Ankle surgery     Past Medical History  Diagnosis Date  . Asthma     PFTs, February, 2011, moderate obstructive disease with response to bronchodilators, normal lung volumes, moderate reduction in diffusing capacity  . Obstructive airway disease   . Hyperlipidemia   . Hypertension   . Hypothyroidism     Patient has had in the past that she does not need treatment  .  CAD (coronary artery disease)     90% distal LAD in the past  /   nuclear, 2008, no ischemia, ejection fraction 70%  . Atrial septal aneurysm     Echo, 2008  . Pinched nerve     lower back  . Kyphoscoliosis   . Shortness of breath   . Endometriosis 1989    RIGHT TUBE  . Endometriosis 1987    LEFT TUBE/OVARY W FOCAL IN-SITU ENDOMETRIAL ADENOCARCINOMA  . Cancer 2002    DUCTAL CIS--S/P LUMPECTOMY, RADIATION AND 6 WEEKS OF TAMOXIFEN  . Ejection fraction     EF 60%, echo, October, 2008  . Elevated CPK     January, 2014  . D-dimer, elevated     January, 2014   BP 150/70  Pulse 84  Resp 14  Ht 5' (1.524 m)  Wt 163 lb (73.936 kg)  BMI 31.83 kg/m2  SpO2 94%     Review of Systems  Musculoskeletal: Positive for gait problem.  Neurological: Positive for weakness and numbness.        Tingling       Objective:   Physical Exam Constitutional: She is oriented to person, place, and time. She appears well-developed and well-nourished.  HENT:  Head: Normocephalic.  Musculoskeletal: She exhibits tenderness.  Neurological: She is alert and oriented to person, place, and time.  Skin: Skin is warm and dry.  Psychiatric: She has a normal mood and affect.  Symmetric normal motor tone is noted throughout. Normal muscle bulk. Muscle testing reveals 5/5 muscle strength of the upper extremity, and 5/5 of the lower extremity, except right iliopsoas 4/5 with pain. Full range of motion in upper and lower extremities, except right hip, restriction into IR and Ext, with pain. ROM of spine is severely restricted.  DTR in the upper and lower extremity are present and symmetric 2+. No clonus is noted.  Patient arises from chair with difficulty.Wide based gait with extreme kyphotic posture, almost in a 90 degree angle of her upper body .  Severe thoracolumbar scoliosis.  Left lower leg , 6cm long wound/incision, has healed well,no signs of infection.        Assessment & Plan:  This is a 62 year old female with  1. Chronic low back pain, status post posterior fusion in the lumbar spine  2. Severe scoliosis, kyphosis  3. Migraines  4. CAD  5. depression  6. Wound /sutured,after a cinderblock fell on her left ankle on June the7th, she got 9 stitches, which got infected she was on Ax and now finally the wound has healed.  Plan :  Continue with medication, refilled pain medication today. Continue with walking as complains allow. Walking or exercising in the pool would be beneficial for the patient, but she has no access to a pool[place] Wanted to prescribe aquatic PT, but patient denies. Suggested seeing a pain psychologist to help her to cope with her situation, but patient denies.  Follow up in 1 month.

## 2013-01-29 ENCOUNTER — Other Ambulatory Visit: Payer: Self-pay | Admitting: Endocrinology

## 2013-01-29 ENCOUNTER — Ambulatory Visit (INDEPENDENT_AMBULATORY_CARE_PROVIDER_SITE_OTHER): Payer: Medicare Other

## 2013-01-29 DIAGNOSIS — Z23 Encounter for immunization: Secondary | ICD-10-CM

## 2013-01-30 ENCOUNTER — Encounter: Payer: Self-pay | Admitting: Physical Medicine and Rehabilitation

## 2013-01-30 ENCOUNTER — Encounter
Payer: Medicare Other | Attending: Physical Medicine and Rehabilitation | Admitting: Physical Medicine and Rehabilitation

## 2013-01-30 VITALS — BP 147/66 | HR 79 | Resp 14 | Ht 60.0 in | Wt 164.0 lb

## 2013-01-30 DIAGNOSIS — F329 Major depressive disorder, single episode, unspecified: Secondary | ICD-10-CM | POA: Insufficient documentation

## 2013-01-30 DIAGNOSIS — F3289 Other specified depressive episodes: Secondary | ICD-10-CM | POA: Insufficient documentation

## 2013-01-30 DIAGNOSIS — Z981 Arthrodesis status: Secondary | ICD-10-CM | POA: Insufficient documentation

## 2013-01-30 DIAGNOSIS — R209 Unspecified disturbances of skin sensation: Secondary | ICD-10-CM | POA: Insufficient documentation

## 2013-01-30 DIAGNOSIS — I2510 Atherosclerotic heart disease of native coronary artery without angina pectoris: Secondary | ICD-10-CM | POA: Insufficient documentation

## 2013-01-30 DIAGNOSIS — E039 Hypothyroidism, unspecified: Secondary | ICD-10-CM | POA: Insufficient documentation

## 2013-01-30 DIAGNOSIS — M545 Low back pain, unspecified: Secondary | ICD-10-CM | POA: Insufficient documentation

## 2013-01-30 DIAGNOSIS — M4000 Postural kyphosis, site unspecified: Secondary | ICD-10-CM | POA: Insufficient documentation

## 2013-01-30 DIAGNOSIS — M4120 Other idiopathic scoliosis, site unspecified: Secondary | ICD-10-CM | POA: Insufficient documentation

## 2013-01-30 DIAGNOSIS — M961 Postlaminectomy syndrome, not elsewhere classified: Secondary | ICD-10-CM

## 2013-01-30 DIAGNOSIS — G8929 Other chronic pain: Secondary | ICD-10-CM | POA: Insufficient documentation

## 2013-01-30 DIAGNOSIS — I10 Essential (primary) hypertension: Secondary | ICD-10-CM | POA: Insufficient documentation

## 2013-01-30 DIAGNOSIS — E785 Hyperlipidemia, unspecified: Secondary | ICD-10-CM | POA: Insufficient documentation

## 2013-01-30 DIAGNOSIS — G43909 Migraine, unspecified, not intractable, without status migrainosus: Secondary | ICD-10-CM | POA: Insufficient documentation

## 2013-01-30 MED ORDER — HYDROCODONE-ACETAMINOPHEN 10-325 MG PO TABS: 1.0000 | ORAL_TABLET | Freq: Three times a day (TID) | ORAL | Status: DC | PRN

## 2013-01-30 MED ORDER — MORPHINE SULFATE ER 30 MG PO TBCR: 30.0000 mg | EXTENDED_RELEASE_TABLET | Freq: Every day | ORAL | Status: DC

## 2013-01-30 NOTE — Progress Notes (Signed)
Subjective:    Patient ID: Hannah Scott, female    DOB: February 16, 1951, 62 y.o.   MRN: [id number]  HPI The patient complains about chronic low back pain pain which radiates into right LE. The patient also complains about numbness and tingling, intermittendly after prolonged standing.  The problem has been stable . Hx of PSF, Hx of severe scoliosis, is not considering further surgery at this point.  Patient has seen endocrinologist , she is taking Synthroid right now, she states,that she is seeing her endocrinologist for a follow up in 6 weeks.  Patient reports that a cinderblock fell on her left ankle on June the7th, she got 9 stitches, which got infected was on Ax and following up with wound care at [facility], the wound/incision has finally healed.  Pain Inventory Average Pain 8 Pain Right Now 7 My pain is constant, tingling and aching  In the last 24 hours, has pain interfered with the following? General activity 10 Relation with others 10 Enjoyment of life 10 What TIME of day is your pain at its worst? all day Sleep (in general) Poor  Pain is worse with: walking and standing Pain improves with: rest and medication Relief from Meds: 5  Mobility walk with assistance use a cane ability to climb steps?  yes do you drive?  yes  Function disabled: date disabled 2008 I need assistance with the following:  meal prep, household duties and shopping  Neuro/Psych weakness numbness tingling trouble walking depression  Prior Studies Any changes since last visit?  yes  Physicians involved in your care Any changes since last visit?  yes   Family History  Problem Relation Age of Onset  . Heart attack Father   . Heart disease Father   . Heart failure Mother   . Pneumonia Mother   . Hypertension Mother   . Heart disease Mother   . Stroke Maternal Grandfather   . Hypertension Maternal Grandfather   . Asthma Paternal Uncle     PAT UNCLES  . Heart attack Paternal Uncle    History    Social History  . Marital Status: Married    Spouse Name: N/A    Number of Children: N/A  . Years of Education: N/A   Social History Main Topics  . Smoking status: Never Smoker   . Smokeless tobacco: Never Used  . Alcohol Use: No  . Drug Use: No  . Sexual Activity: No   Other Topics Concern  . None   Social History Narrative  . None   Past Surgical History  Procedure Laterality Date  . Cataract extraction    . Cardiovascular stress test  09/07/05    Nuclear, was negative  . Breast lumpectomy  2003    radiation on right  . Appendectomy  1987    AT [facility]  . Total abdominal hysterectomy  1987    LSO, APPENDECTOMY  . Pelvic laparoscopy  1989    RSO, LYSIS OF ADHESIONS  . Oophorectomy      LSO -RSO  . Back surgery      Fusion  . Ankle surgery     Past Medical History  Diagnosis Date  . Asthma     PFTs, February, 2011, moderate obstructive disease with response to bronchodilators, normal lung volumes, moderate reduction in diffusing capacity  . Obstructive airway disease   . Hyperlipidemia   . Hypertension   . Hypothyroidism     Patient has had in the past that she does not need treatment  .  CAD (coronary artery disease)     90% distal LAD in the past  /   nuclear, 2008, no ischemia, ejection fraction 70%  . Atrial septal aneurysm     Echo, 2008  . Pinched nerve     lower back  . Kyphoscoliosis   . Shortness of breath   . Endometriosis 1989    RIGHT TUBE  . Endometriosis 1987    LEFT TUBE/OVARY W FOCAL IN-SITU ENDOMETRIAL ADENOCARCINOMA  . Cancer 2002    DUCTAL CIS--S/P LUMPECTOMY, RADIATION AND 6 WEEKS OF TAMOXIFEN  . Ejection fraction     EF 60%, echo, October, 2008  . Elevated CPK     January, 2014  . D-dimer, elevated     January, 2014   BP 147/66  Pulse 79  Resp 14  Ht 5' (1.524 m)  Wt 164 lb (74.39 kg)  BMI 32.03 kg/m2  SpO2 93%     Review of Systems  Musculoskeletal: Positive for back pain and gait problem.  Neurological: Positive for  weakness and numbness.       Tingling  Psychiatric/Behavioral: Positive for dysphoric mood.  All other systems reviewed and are negative.       Objective:   Physical Exam Constitutional: She is oriented to person, place, and time. She appears well-developed and well-nourished.  HENT:  Head: Normocephalic.  Musculoskeletal: She exhibits tenderness.  Neurological: She is alert and oriented to person, place, and time.  Skin: Skin is warm and dry.  Psychiatric: She has a normal mood and affect.  Symmetric normal motor tone is noted throughout. Normal muscle bulk. Muscle testing reveals 5/5 muscle strength of the upper extremity, and 5/5 of the lower extremity, except right iliopsoas 4/5 with pain. Full range of motion in upper and lower extremities, except right hip, restriction into IR and Ext, with pain. ROM of spine is severely restricted.  DTR in the upper and lower extremity are present and symmetric 2+. No clonus is noted.  Patient arises from chair with difficulty.Wide based gait with extreme kyphotic posture, almost in a 90 degree angle of her upper body .  Severe thoracolumbar scoliosis.  Left lower leg , 6cm long wound/incision, has healed well,no signs of infection.        Assessment & Plan:  This is a 62 year old female with  1. Chronic low back pain, status post posterior fusion in the lumbar spine  2. Severe scoliosis, kyphosis  3. Migraines  4. CAD  5. depression  6. Wound /sutured,after a cinderblock fell on her left ankle on June the7th, she got 9 stitches, which got infected she was on Ax and now finally the wound has healed.  Plan :  Continue with medication, refilled pain medication today. Continue with walking as complains allow. Walking or exercising in the pool would be beneficial for the patient, but she has no access to a pool. [place] Wanted to prescribe aquatic PT, but patient denies. Suggested seeing a pain psychologist to  help her to cope with her situation, but patient denies.  Follow up in 1 month.

## 2013-01-30 NOTE — Patient Instructions (Signed)
Stay as active as tolerated. 

## 2013-02-22 ENCOUNTER — Other Ambulatory Visit: Payer: Self-pay | Admitting: *Deleted

## 2013-02-22 DIAGNOSIS — J45909 Unspecified asthma, uncomplicated: Secondary | ICD-10-CM

## 2013-02-22 MED ORDER — MOMETASONE FURO-FORMOTEROL FUM 200-5 MCG/ACT IN AERO: 2.0000 | INHALATION_SPRAY | Freq: Two times a day (BID) | RESPIRATORY_TRACT | Status: DC

## 2013-02-22 NOTE — Telephone Encounter (Signed)
Dulera refill sent to pharmacy 

## 2013-02-27 ENCOUNTER — Other Ambulatory Visit: Payer: Self-pay | Admitting: *Deleted

## 2013-02-27 MED ORDER — LEVOTHYROXINE SODIUM 75 MCG PO TABS: 75.0000 ug | ORAL_TABLET | Freq: Every day | ORAL | Status: DC

## 2013-02-28 ENCOUNTER — Encounter
Payer: Medicare Other | Attending: Physical Medicine and Rehabilitation | Admitting: Physical Medicine and Rehabilitation

## 2013-02-28 ENCOUNTER — Encounter: Payer: Self-pay | Admitting: Physical Medicine and Rehabilitation

## 2013-02-28 VITALS — BP 153/75 | HR 122 | Resp 16 | Ht 60.0 in | Wt 166.4 lb

## 2013-02-28 DIAGNOSIS — F3289 Other specified depressive episodes: Secondary | ICD-10-CM | POA: Insufficient documentation

## 2013-02-28 DIAGNOSIS — M545 Low back pain, unspecified: Secondary | ICD-10-CM | POA: Insufficient documentation

## 2013-02-28 DIAGNOSIS — M4120 Other idiopathic scoliosis, site unspecified: Secondary | ICD-10-CM | POA: Insufficient documentation

## 2013-02-28 DIAGNOSIS — G8929 Other chronic pain: Secondary | ICD-10-CM | POA: Insufficient documentation

## 2013-02-28 DIAGNOSIS — G43909 Migraine, unspecified, not intractable, without status migrainosus: Secondary | ICD-10-CM | POA: Insufficient documentation

## 2013-02-28 DIAGNOSIS — Z981 Arthrodesis status: Secondary | ICD-10-CM | POA: Insufficient documentation

## 2013-02-28 DIAGNOSIS — F329 Major depressive disorder, single episode, unspecified: Secondary | ICD-10-CM | POA: Insufficient documentation

## 2013-02-28 DIAGNOSIS — I2510 Atherosclerotic heart disease of native coronary artery without angina pectoris: Secondary | ICD-10-CM | POA: Insufficient documentation

## 2013-02-28 DIAGNOSIS — M961 Postlaminectomy syndrome, not elsewhere classified: Secondary | ICD-10-CM

## 2013-02-28 MED ORDER — MORPHINE SULFATE ER 30 MG PO TBCR: 30.0000 mg | EXTENDED_RELEASE_TABLET | Freq: Every day | ORAL | Status: DC

## 2013-02-28 MED ORDER — HYDROCODONE-ACETAMINOPHEN 10-325 MG PO TABS: 1.0000 | ORAL_TABLET | Freq: Three times a day (TID) | ORAL | Status: DC | PRN

## 2013-02-28 NOTE — Progress Notes (Signed)
Subjective:    Patient ID: Hannah Scott, female    DOB: 1950/07/28, 62 y.o.   MRN: [id number]  HPI The patient complains about chronic low back pain pain which radiates into right LE. The patient also complains about numbness and tingling, intermittendly after prolonged standing.  The problem has been stable . Hx of PSF, Hx of severe scoliosis, is not considering further surgery at this point.  Patient has seen endocrinologist , she is taking Synthroid right now, she states,that she is seeing her endocrinologist for a follow up in 2 weeks.  Patient reports that a cinderblock fell on her left ankle on June the7th, she got 9 stitches, which got infected was on Ax and following up with wound care at [facility], the wound/incision has finally healed last month.   Pain Inventory Average Pain 8 Pain Right Now 7 My pain is constant, burning, tingling and aching  In the last 24 hours, has pain interfered with the following? General activity 10 Relation with others 10 Enjoyment of life 10 What TIME of day is your pain at its worst? all Sleep (in general) Fair  Pain is worse with: walking and standing Pain improves with: rest and medication Relief from Meds: 7  Mobility use a cane ability to climb steps?  yes do you drive?  yes  Function disabled: date disabled . I need assistance with the following:  meal prep, household duties and shopping  Neuro/Psych weakness numbness  Prior Studies Any changes since last visit?  no  Physicians involved in your care Any changes since last visit?  no   Family History  Problem Relation Age of Onset  . Heart attack Father   . Heart disease Father   . Heart failure Mother   . Pneumonia Mother   . Hypertension Mother   . Heart disease Mother   . Stroke Maternal Grandfather   . Hypertension Maternal Grandfather   . Asthma Paternal Uncle     PAT UNCLES  . Heart attack Paternal Uncle    History   Social History  . Marital Status: Married   Spouse Name: N/A    Number of Children: N/A  . Years of Education: N/A   Social History Main Topics  . Smoking status: Never Smoker   . Smokeless tobacco: Never Used  . Alcohol Use: No  . Drug Use: No  . Sexual Activity: No   Other Topics Concern  . None   Social History Narrative  . None   Past Surgical History  Procedure Laterality Date  . Cataract extraction    . Cardiovascular stress test  09/07/05    Nuclear, was negative  . Breast lumpectomy  2003    radiation on right  . Appendectomy  1987    AT [facility]  . Total abdominal hysterectomy  1987    LSO, APPENDECTOMY  . Pelvic laparoscopy  1989    RSO, LYSIS OF ADHESIONS  . Oophorectomy      LSO -RSO  . Back surgery      Fusion  . Ankle surgery     Past Medical History  Diagnosis Date  . Asthma     PFTs, February, 2011, moderate obstructive disease with response to bronchodilators, normal lung volumes, moderate reduction in diffusing capacity  . Obstructive airway disease   . Hyperlipidemia   . Hypertension   . Hypothyroidism     Patient has had in the past that she does not need treatment  . CAD (coronary artery disease)  90% distal LAD in the past  /   nuclear, 2008, no ischemia, ejection fraction 70%  . Atrial septal aneurysm     Echo, 2008  . Pinched nerve     lower back  . Kyphoscoliosis   . Shortness of breath   . Endometriosis 1989    RIGHT TUBE  . Endometriosis 1987    LEFT TUBE/OVARY W FOCAL IN-SITU ENDOMETRIAL ADENOCARCINOMA  . Cancer 2002    DUCTAL CIS--S/P LUMPECTOMY, RADIATION AND 6 WEEKS OF TAMOXIFEN  . Ejection fraction     EF 60%, echo, October, 2008  . Elevated CPK     January, 2014  . D-dimer, elevated     January, 2014   BP 153/75  Pulse 122  Resp 16  Ht 5' (1.524 m)  Wt 166 lb 6.4 oz (75.479 kg)  BMI 32.50 kg/m2  SpO2 95%   Review of Systems  Neurological: Positive for weakness and numbness.  All other systems reviewed and are negative.       Objective:   Physical  Exam Constitutional: She is oriented to person, place, and time. She appears well-developed and well-nourished.  HENT:  Head: Normocephalic.  Musculoskeletal: She exhibits tenderness.  Neurological: She is alert and oriented to person, place, and time.  Skin: Skin is warm and dry.  Psychiatric: She has a normal mood and affect.  Symmetric normal motor tone is noted throughout. Normal muscle bulk. Muscle testing reveals 5/5 muscle strength of the upper extremity, and 5/5 of the lower extremity, except right iliopsoas 4/5 with pain. Full range of motion in upper and lower extremities, except right hip, restriction into IR and Ext, with pain. ROM of spine is severely restricted.  DTR in the upper and lower extremity are present and symmetric 2+. No clonus is noted.  Patient arises from chair with difficulty.Wide based gait with extreme kyphotic posture, almost in a 90 degree angle of her upper body .  Severe thoracolumbar scoliosis.  Left lower leg , 6cm long wound/incision, has healed well,no signs of infection.        Assessment & Plan:  This is a 62 year old female with  1. Chronic low back pain, status post posterior fusion in the lumbar spine  2. Severe scoliosis, kyphosis  3. Migraines  4. CAD  5. depression  6. Wound /sutured,after a cinderblock fell on her left ankle on June the7th, she got 9 stitches, which got infected she was on Ax and now finally the wound has healed.  Plan :  Continue with medication, refilled pain medication today. Continue with walking as complains allow. Walking or exercising in the pool would be beneficial for the patient, but she has no access to a pool. [place] Wanted to prescribe aquatic PT, but patient denies. Suggested seeing a pain psychologist to help her to cope with her situation, but patient denies.  Follow up in 1 month.

## 2013-02-28 NOTE — Patient Instructions (Signed)
Try to stay as active as tolerated 

## 2013-03-08 ENCOUNTER — Ambulatory Visit (INDEPENDENT_AMBULATORY_CARE_PROVIDER_SITE_OTHER): Payer: Medicare Other | Admitting: Cardiology

## 2013-03-08 ENCOUNTER — Encounter: Payer: Self-pay | Admitting: Cardiology

## 2013-03-08 VITALS — BP 120/82 | HR 74 | Ht 60.0 in | Wt 164.0 lb

## 2013-03-08 DIAGNOSIS — I2510 Atherosclerotic heart disease of native coronary artery without angina pectoris: Secondary | ICD-10-CM

## 2013-03-08 NOTE — Patient Instructions (Signed)
**Note De-Identified Ardyth Kelso Obfuscation** Your physician has requested that you have a lexiscan myoview. For further information please visit [email or web address] Please follow instruction sheet, as given.  Your physician recommends that you schedule a follow-up appointment in: 8 weeks

## 2013-03-08 NOTE — Progress Notes (Signed)
HPI  Patient is seen today to followup coronary disease. I had seen her last in January, 2014. At that time there was concern about lab abnormalities. However her TSH then came back and it was markedly elevated. Since that time her meds have been adjusted by the endocrinology team. It seems that there have been continued readjustments of her thyroid replacement.  The patient does have coronary disease. In the past she had a distal LAD lesion. She had a nuclear scan in 2008 that showed no definite ischemia. She tells me now that frequently when walking she gets tightness in her neck bilaterally. She stops and this feels better. It is not present all the time. She's not having any chest pain.  Allergies  Allergen Reactions  . Fluticasone-Salmeterol     REACTION: headache  Never tried Symbicort or Dulera    Current Outpatient Prescriptions  Medication Sig Dispense Refill  . albuterol (PROAIR HFA) 108 (90 BASE) MCG/ACT inhaler Inhale 1-2 puffs into the lungs every 6 (six) hours as needed.  1 Inhaler  6  . amLODipine (NORVASC) 2.5 MG tablet TAKE ONE TABLET BY MOUTH EVERY DAY  90 tablet  1  . aspirin 81 MG tablet Take 81 mg by mouth daily.        [name] atorvastatin (LIPITOR) 20 MG tablet Take 1 tablet (20 mg total) by mouth daily.  90 tablet  1  . beclomethasone (QVAR) 80 MCG/ACT inhaler Inhale 2 puffs into the lungs every 12 (twelve) hours. Gargle and swallow after use  8.7 Inhaler  11  . benazepril (LOTENSIN) 40 MG tablet TAKE ONE-HALF TABLET BY MOUTH EVERY DAY  45 tablet  1  . HYDROcodone-acetaminophen (NORCO) 10-325 MG per tablet Take 1 tablet by mouth every 8 (eight) hours as needed.  90 tablet  0  . ipratropium-albuterol (DUONEB) 0.5-2.5 (3) MG/3ML SOLN Take 3 mLs by nebulization every 4 (four) hours as needed. Must not be qid unless during exacerbation. Dx 493.90  360 mL  0  . levothyroxine (SYNTHROID, LEVOTHROID) 75 MCG tablet Take 1 tablet (75 mcg total) by mouth daily before breakfast.   30 tablet  1  . metoprolol tartrate (LOPRESSOR) 25 MG tablet TAKE ONE TABLET BY MOUTH TWICE DAILY  180 tablet  1  . mometasone-formoterol (DULERA) 200-5 MCG/ACT AERO Inhale 2 puffs into the lungs 2 (two) times daily.  8.8 g  5  . morphine (MS CONTIN) 30 MG 12 hr tablet Take 1 tablet (30 mg total) by mouth at bedtime.  30 tablet  0  . sertraline (ZOLOFT) 50 MG tablet TAKE ONE TABLET BY MOUTH EVERY DAY  30 tablet  5   No current facility-administered medications for this visit.    History   Social History  . Marital Status: Married    Spouse Name: N/A    Number of Children: N/A  . Years of Education: N/A   Occupational History  . Not on file.   Social History Main Topics  . Smoking status: Never Smoker   . Smokeless tobacco: Never Used  . Alcohol Use: No  . Drug Use: No  . Sexual Activity: No   Other Topics Concern  . Not on file   Social History Narrative  . No narrative on file    Family History  Problem Relation Age of Onset  . Heart attack Father   . Heart disease Father   . Heart failure Mother   . Pneumonia Mother   . Hypertension Mother   .  Heart disease Mother   . Stroke Maternal Grandfather   . Hypertension Maternal Grandfather   . Asthma Paternal Uncle     PAT UNCLES  . Heart attack Paternal Uncle     Past Medical History  Diagnosis Date  . Asthma     PFTs, February, 2011, moderate obstructive disease with response to bronchodilators, normal lung volumes, moderate reduction in diffusing capacity  . Obstructive airway disease   . Hyperlipidemia   . Hypertension   . Hypothyroidism     Patient has had in the past that she does not need treatment  . CAD (coronary artery disease)     90% distal LAD in the past  /   nuclear, 2008, no ischemia, ejection fraction 70%  . Atrial septal aneurysm     Echo, 2008  . Pinched nerve     lower back  . Kyphoscoliosis   . Shortness of breath   . Endometriosis 1989    RIGHT TUBE  . Endometriosis 1987    LEFT  TUBE/OVARY W FOCAL IN-SITU ENDOMETRIAL ADENOCARCINOMA  . Cancer 2002    DUCTAL CIS--S/P LUMPECTOMY, RADIATION AND 6 WEEKS OF TAMOXIFEN  . Ejection fraction     EF 60%, echo, October, 2008  . Elevated CPK     January, 2014  . D-dimer, elevated     January, 2014    Past Surgical History  Procedure Laterality Date  . Cataract extraction    . Cardiovascular stress test  09/07/05    Nuclear, was negative  . Breast lumpectomy  2003    radiation on right  . Appendectomy  1987    AT TAH  . Total abdominal hysterectomy  1987    LSO, APPENDECTOMY  . Pelvic laparoscopy  1989    RSO, LYSIS OF ADHESIONS  . Oophorectomy      LSO -RSO  . Back surgery      Fusion  . Ankle surgery      Patient Active Problem List   Diagnosis Date Noted  . Elevated CPK   . D-dimer, elevated   . H/O hyperthyroidism 12/20/2011  . Multinodular goiter 12/01/2011  . Asthma   . Hypothyroidism   . CAD (coronary artery disease)   . Ejection fraction   . Atrial septal aneurysm   . Hyperlipidemia   . Hypertension   . Shortness of breath   . Scoliosis   . Spinal stenosis, lumbar region, with neurogenic claudication 07/01/2011  . Lumbar postlaminectomy syndrome 07/01/2011  . Osteoarthritis of right hip 07/01/2011  . Contracture of right hip 07/01/2011  . Endometriosis   . DEPRESSION 12/24/2009  . FATIGUE 12/24/2009  . ESOPHAGEAL REFLUX 04/09/2007  . MIGRAINES, HX OF 05/19/2006    ROS   Patient denies fever, chills, headache, sweats, rash, change in vision, change in hearing, chest pain, cough, nausea or vomiting, urinary symptoms. All other systems are reviewed and are negative.  PHYSICAL EXAM  Patient is oriented to person time and place. Affect is normal. There is no jugulovenous distention. Lungs are clear. Respiratory effort is nonlabored. Cardiac exam reveals S1 and S2. There no clicks or significant murmurs. The abdomen is soft. There is no peripheral edema. There no musculoskeletal deformities.  There are no skin rashes.  Filed Vitals:   03/08/13 1520  BP: 120/82  Pulse: 74  Height: 5' (1.524 m)  Weight: 164 lb (74.39 kg)   EKG is done today and reviewed by me. There is no acute abnormality. There is decreased anterior R  wave progression. There is actually decreased voltage in leads V2 V3 and V4. I'm not sure that this represents a diagnostic change.  ASSESSMENT & PLAN

## 2013-03-08 NOTE — Assessment & Plan Note (Signed)
The patient has known coronary disease. She now has exertional sensation of neck tightness. I'm not sure that this represents angina. We will proceed with a nuclear stress study. I will then see her back for followup.

## 2013-03-15 ENCOUNTER — Ambulatory Visit (INDEPENDENT_AMBULATORY_CARE_PROVIDER_SITE_OTHER): Payer: Medicare Other | Admitting: Endocrinology

## 2013-03-15 ENCOUNTER — Encounter: Payer: Self-pay | Admitting: Endocrinology

## 2013-03-15 VITALS — BP 160/100 | HR 68 | Temp 98.3°F | Resp 16

## 2013-03-15 DIAGNOSIS — E039 Hypothyroidism, unspecified: Secondary | ICD-10-CM

## 2013-03-15 NOTE — Progress Notes (Signed)
Subjective:    Patient ID: Hannah Scott, female    DOB: [birth year], 62 y.o.   MRN: [id number]  HPI Pt had i-131 rx for hyperthyroidism, due to multinodular goiter, in September, 2013.  In early 2014, TSH was high, and synthroid was started.  She has hoarseness and hair loss.   Past Medical History  Diagnosis Date  . Asthma     PFTs, February, 2011, moderate obstructive disease with response to bronchodilators, normal lung volumes, moderate reduction in diffusing capacity  . Obstructive airway disease   . Hyperlipidemia   . Hypertension   . Hypothyroidism     Patient has had in the past that she does not need treatment  . CAD (coronary artery disease)     90% distal LAD in the past  /   nuclear, 2008, no ischemia, ejection fraction 70%  . Atrial septal aneurysm     Echo, 2008  . Pinched nerve     lower back  . Kyphoscoliosis   . Shortness of breath   . Endometriosis 1989    RIGHT TUBE  . Endometriosis 1987    LEFT TUBE/OVARY W FOCAL IN-SITU ENDOMETRIAL ADENOCARCINOMA  . Cancer 2002    DUCTAL CIS--S/P LUMPECTOMY, RADIATION AND 6 WEEKS OF TAMOXIFEN  . Ejection fraction     EF 60%, echo, October, 2008  . Elevated CPK     January, 2014  . D-dimer, elevated     January, 2014    Past Surgical History  Procedure Laterality Date  . Cataract extraction    . Cardiovascular stress test  09/07/05    Nuclear, was negative  . Breast lumpectomy  2003    radiation on right  . Appendectomy  1987    AT TAH  . Total abdominal hysterectomy  1987    LSO, APPENDECTOMY  . Pelvic laparoscopy  1989    RSO, LYSIS OF ADHESIONS  . Oophorectomy      LSO -RSO  . Back surgery      Fusion  . Ankle surgery      History   Social History  . Marital Status: Married    Spouse Name: N/A    Number of Children: N/A  . Years of Education: N/A   Occupational History  . Not on file.   Social History Main Topics  . Smoking status: Never Smoker   . Smokeless tobacco: Never Used  . Alcohol Use:  No  . Drug Use: No  . Sexual Activity: No   Other Topics Concern  . Not on file   Social History Narrative  . No narrative on file    Current Outpatient Prescriptions on File Prior to Visit  Medication Sig Dispense Refill  . albuterol (PROAIR HFA) 108 (90 BASE) MCG/ACT inhaler Inhale 1-2 puffs into the lungs every 6 (six) hours as needed.  1 Inhaler  6  . amLODipine (NORVASC) 2.5 MG tablet TAKE ONE TABLET BY MOUTH EVERY DAY  90 tablet  1  . aspirin 81 MG tablet Take 81 mg by mouth daily.        [name] atorvastatin (LIPITOR) 20 MG tablet Take 1 tablet (20 mg total) by mouth daily.  90 tablet  1  . beclomethasone (QVAR) 80 MCG/ACT inhaler Inhale 2 puffs into the lungs every 12 (twelve) hours. Gargle and swallow after use  8.7 Inhaler  11  . benazepril (LOTENSIN) 40 MG tablet TAKE ONE-HALF TABLET BY MOUTH EVERY DAY  45 tablet  1  . HYDROcodone-acetaminophen (NORCO)  10-325 MG per tablet Take 1 tablet by mouth every 8 (eight) hours as needed.  90 tablet  0  . ipratropium-albuterol (DUONEB) 0.5-2.5 (3) MG/3ML SOLN Take 3 mLs by nebulization every 4 (four) hours as needed. Must not be qid unless during exacerbation. Dx 493.90  360 mL  0  . levothyroxine (SYNTHROID, LEVOTHROID) 75 MCG tablet Take 1 tablet (75 mcg total) by mouth daily before breakfast.  30 tablet  1  . metoprolol tartrate (LOPRESSOR) 25 MG tablet TAKE ONE TABLET BY MOUTH TWICE DAILY  180 tablet  1  . mometasone-formoterol (DULERA) 200-5 MCG/ACT AERO Inhale 2 puffs into the lungs 2 (two) times daily.  8.8 g  5  . morphine (MS CONTIN) 30 MG 12 hr tablet Take 1 tablet (30 mg total) by mouth at bedtime.  30 tablet  0  . sertraline (ZOLOFT) 50 MG tablet TAKE ONE TABLET BY MOUTH EVERY DAY  30 tablet  5   No current facility-administered medications on file prior to visit.   Allergies  Allergen Reactions  . Fluticasone-Salmeterol     REACTION: headache  Never tried Symbicort or Dulera   Family History  Problem Relation Age of Onset   . Heart attack Father   . Heart disease Father   . Heart failure Mother   . Pneumonia Mother   . Hypertension Mother   . Heart disease Mother   . Stroke Maternal Grandfather   . Hypertension Maternal Grandfather   . Asthma Paternal Uncle     PAT UNCLES  . Heart attack Paternal Uncle     BP 160/100  Pulse 68  Temp(Src) 98.3 F (36.8 C) (Oral)  Resp 16  Review of Systems She has weight gain.      Objective:   Physical Exam VITAL SIGNS:  See vs page GENERAL: no distress NECK: There is no palpable thyroid enlargement.  No thyroid nodule is palpable.  No palpable lymphadenopathy at the anterior neck.   Lab Results  Component Value Date   TSH 1.49 03/15/2013      Assessment & Plan:  Hypothyroidism, due to i-131 rx, well-replaced

## 2013-03-15 NOTE — Patient Instructions (Signed)
A thyroid blood test is being requested for you today.  We'll contact you with results. Please come back for a follow-up appointment in 3 months.    Most of the time, your thyroid will start working more or less than it is now.  This means that your need for the levothyroxinw will go up or down with time, bult seldom stays the same.   

## 2013-03-26 ENCOUNTER — Encounter: Payer: Self-pay | Admitting: Cardiology

## 2013-03-28 ENCOUNTER — Ambulatory Visit (HOSPITAL_COMMUNITY): Payer: Medicare Other | Attending: Cardiology | Admitting: Radiology

## 2013-03-28 ENCOUNTER — Encounter: Payer: Self-pay | Admitting: Cardiology

## 2013-03-28 VITALS — BP 156/104 | HR 63 | Ht 60.0 in | Wt 166.0 lb

## 2013-03-28 DIAGNOSIS — E785 Hyperlipidemia, unspecified: Secondary | ICD-10-CM | POA: Insufficient documentation

## 2013-03-28 DIAGNOSIS — Z8249 Family history of ischemic heart disease and other diseases of the circulatory system: Secondary | ICD-10-CM | POA: Insufficient documentation

## 2013-03-28 DIAGNOSIS — R079 Chest pain, unspecified: Secondary | ICD-10-CM

## 2013-03-28 DIAGNOSIS — J45909 Unspecified asthma, uncomplicated: Secondary | ICD-10-CM | POA: Insufficient documentation

## 2013-03-28 DIAGNOSIS — R0602 Shortness of breath: Secondary | ICD-10-CM | POA: Insufficient documentation

## 2013-03-28 DIAGNOSIS — I10 Essential (primary) hypertension: Secondary | ICD-10-CM | POA: Insufficient documentation

## 2013-03-28 DIAGNOSIS — I2510 Atherosclerotic heart disease of native coronary artery without angina pectoris: Secondary | ICD-10-CM | POA: Insufficient documentation

## 2013-03-28 DIAGNOSIS — R0789 Other chest pain: Secondary | ICD-10-CM | POA: Insufficient documentation

## 2013-03-28 DIAGNOSIS — I252 Old myocardial infarction: Secondary | ICD-10-CM | POA: Insufficient documentation

## 2013-03-28 MED ORDER — TECHNETIUM TC 99M SESTAMIBI GENERIC - CARDIOLITE
30.0000 | Freq: Once | INTRAVENOUS | Status: AC | PRN
  Administered 2013-03-28: 30 via INTRAVENOUS

## 2013-03-28 MED ORDER — TECHNETIUM TC 99M SESTAMIBI GENERIC - CARDIOLITE
10.0000 | Freq: Once | INTRAVENOUS | Status: AC | PRN
  Administered 2013-03-28: 10 via INTRAVENOUS

## 2013-03-28 MED ORDER — REGADENOSON 0.4 MG/5ML IV SOLN
0.4000 mg | Freq: Once | INTRAVENOUS | Status: AC
  Administered 2013-03-28: 0.4 mg via INTRAVENOUS

## 2013-03-28 NOTE — Progress Notes (Signed)
[facility] SITE 3 NUCLEAR MED [address] [place], [place] [address] [phone]    Cardiology Nuclear Med Study  Hannah Scott is a 62 y.o. female     MRN : [id number]     DOB: 07-03-1950  Procedure Date: 03/28/2013  Nuclear Med Background Indication for Stress Test:  Evaluation for Ischemia History:  CAD, MI, Cath 1998, Echo 2008 EF 60%, MPI 2008 (ischemia) EF 70%, Asthma, Atrial Septal Aneurysm Cardiac Risk Factors: Family History - CAD, Hypertension and Lipids  Symptoms:  Chest Tightness and SOB   Nuclear Pre-Procedure Caffeine/Decaff Intake:  None NPO After: 7:00pm   Lungs:  clear O2 Sat: 94% on room air. IV 0.9% NS with Angio Cath:  22g  IV Site: R Antecubital  IV Started by:  Bonnita Levan, RN  Chest Size (in):  40 Cup Size: C  Height: 5' (1.524 m)  Weight:  166 lb (75.297 kg)  BMI:  Body mass index is 32.42 kg/(m^2). Tech Comments:  N/A    Nuclear Med Study 1 or 2 day study: 1 day  Stress Test Type:  Lexiscan  Reading MD: Tobias Alexander, MD  Order Authorizing Provider:  Willa Rough, MD  Resting Radionuclide: Technetium 11m Sestamibi  Resting Radionuclide Dose: 11.0 mCi   Stress Radionuclide:  Technetium 47m Sestamibi  Stress Radionuclide Dose: 33.0 mCi           Stress Protocol Rest HR: 63 Stress HR: 103  Rest BP: 156/104 Stress BP: 170/90  Exercise Time (min): n/a METS: n/a   Predicted Max HR: 157 bpm % Max HR: 65.61 bpm Rate Pressure Product: 21308   Dose of Adenosine (mg):  n/a Dose of Lexiscan: 0.4 mg  Dose of Atropine (mg): n/a Dose of Dobutamine: n/a mcg/kg/min (at max HR)  Stress Test Technologist: Nelson Chimes, BS-ES  Nuclear Technologist:  Harlow Asa, CNMT     Rest Procedure:  Myocardial perfusion imaging was performed at rest 45 minutes following the intravenous administration of Technetium 45m Sestamibi. Rest ECG: NSR - Normal EKG  Stress Procedure:  The patient received IV Lexiscan 0.4 mg over 15-seconds.  Technetium 22m  Sestamibi injected at 30-seconds.  Quantitative spect images were obtained after a 45 minute delay.  During the infusion of Lexiscan, patient felt like her heart was racing.   This resolved in recovery.  Stress ECG: No significant change from baseline ECG  QPS Raw Data Images:  Normal; no motion artifact; normal heart/lung ratio. Stress Images:  There is decreased uptake in the basal and mid inferolateral walls and basal inferior wall.  Rest Images:  There is decreased uptake in the basal inferolateral wall.  Subtraction (SDS):  There is a small area moderate severity ischemia in the basal inferior and mid inferolateral walls. (SDS 4) Transient Ischemic Dilatation (Normal <1.22):  0.96 Lung/Heart Ratio (Normal <0.45):  0.39  Quantitative Gated Spect Images QGS EDV:  95 ml QGS ESV:  40 ml  Impression Exercise Capacity:  Lexiscan with no exercise. BP Response:  Normal blood pressure response. Clinical Symptoms:  No significant symptoms noted. ECG Impression:  No significant ST segment change suggestive of ischemia. Comparison with Prior Nuclear Study: No images to compare  Overall Impression:  Intermediate risk stress nuclear study with a small area moderate severity reversible defect in PLVB territory. This read might be affected by a poor quality study with extracardiac attenuation.   LV Ejection Fraction: 58%.  LV Wall Motion:  NL LV Function; NL Wall Motion.  Tobias Alexander, H 03/28/2013

## 2013-03-29 ENCOUNTER — Encounter: Payer: Self-pay | Admitting: Cardiology

## 2013-04-01 ENCOUNTER — Other Ambulatory Visit: Payer: Self-pay | Admitting: *Deleted

## 2013-04-01 MED ORDER — MORPHINE SULFATE ER 30 MG PO TBCR: 30.0000 mg | EXTENDED_RELEASE_TABLET | Freq: Every day | ORAL | Status: DC

## 2013-04-01 MED ORDER — LEVOTHYROXINE SODIUM 75 MCG PO TABS: 75.0000 ug | ORAL_TABLET | Freq: Every day | ORAL | Status: DC

## 2013-04-01 MED ORDER — HYDROCODONE-ACETAMINOPHEN 10-325 MG PO TABS: 1.0000 | ORAL_TABLET | Freq: Three times a day (TID) | ORAL | Status: DC | PRN

## 2013-04-01 NOTE — Telephone Encounter (Signed)
rx printed early for controlled medication for the visit with RN on 04/04/13 (to be signed by MD)

## 2013-04-01 NOTE — Telephone Encounter (Signed)
rx printed early for controlled medication for the visit with RN on 04/05/13 (to be signed by MD) (reprinted due to wrong MD on first rxs, changed to DR Kirsteins)

## 2013-04-02 ENCOUNTER — Ambulatory Visit: Payer: Medicare Other | Admitting: Physical Medicine and Rehabilitation

## 2013-04-03 ENCOUNTER — Encounter: Payer: Self-pay | Admitting: Internal Medicine

## 2013-04-04 ENCOUNTER — Encounter: Payer: Medicare Other | Admitting: Physical Medicine and Rehabilitation

## 2013-04-04 ENCOUNTER — Encounter: Payer: Medicare Other | Attending: Physical Medicine & Rehabilitation | Admitting: *Deleted

## 2013-04-04 VITALS — BP 158/70 | HR 75 | Resp 14 | Ht 60.0 in | Wt 166.0 lb

## 2013-04-04 DIAGNOSIS — M419 Scoliosis, unspecified: Secondary | ICD-10-CM

## 2013-04-04 DIAGNOSIS — M961 Postlaminectomy syndrome, not elsewhere classified: Secondary | ICD-10-CM

## 2013-04-04 DIAGNOSIS — M24551 Contracture, right hip: Secondary | ICD-10-CM

## 2013-04-04 DIAGNOSIS — M48062 Spinal stenosis, lumbar region with neurogenic claudication: Secondary | ICD-10-CM

## 2013-04-04 DIAGNOSIS — M1611 Unilateral primary osteoarthritis, right hip: Secondary | ICD-10-CM

## 2013-04-04 NOTE — Progress Notes (Signed)
Here for pill count and medication refills. MS Contin 30 mg q hs # 30  Fill date 03/01/13   Today NV# 6, Hydrocodone 10-325 mg # 90  1 q 8hr fill date 03/26/13 Today NV# 66. All counts are appropriate.  Refills given.  Follow up in one month for pill count and med refills with RN/PA

## 2013-04-04 NOTE — Patient Instructions (Signed)
Please schedule one month follow up with RN for med management  

## 2013-04-05 ENCOUNTER — Encounter: Payer: Self-pay | Admitting: Cardiology

## 2013-04-05 ENCOUNTER — Ambulatory Visit (INDEPENDENT_AMBULATORY_CARE_PROVIDER_SITE_OTHER): Payer: Medicare Other | Admitting: Cardiology

## 2013-04-05 VITALS — BP 152/90 | HR 65 | Ht 60.0 in | Wt 167.6 lb

## 2013-04-05 DIAGNOSIS — R0602 Shortness of breath: Secondary | ICD-10-CM

## 2013-04-05 DIAGNOSIS — E039 Hypothyroidism, unspecified: Secondary | ICD-10-CM

## 2013-04-05 DIAGNOSIS — I2510 Atherosclerotic heart disease of native coronary artery without angina pectoris: Secondary | ICD-10-CM

## 2013-04-05 MED ORDER — AMLODIPINE BESYLATE 5 MG PO TABS: ORAL_TABLET | ORAL | Status: DC

## 2013-04-05 NOTE — Assessment & Plan Note (Addendum)
Patient's thyroid is being treated. Her TSH on March 15, 2013 was in the normal range at 1.5.

## 2013-04-05 NOTE — Progress Notes (Signed)
HPI  Patient is seen today to follow up coronary disease. She also has some shortness of breath. We know from the past that she had a distal LAD lesion. When I saw her last she was having some neck discomfort with exercise. More recently she is having some shortness of breath with exertion. This is not always associated with neck discomfort. She's not had any syncope or presyncope. She's had an abnormality noted on her mammogram and she's very concerned about this.  After I saw her last we did a nuclear stress study. The study was technically difficult. There was question of a small area of ischemia. Wall motion appears normal.  Allergies  Allergen Reactions  . Fluticasone-Salmeterol     REACTION: headache.  She is tolerating Dulera well.    Current Outpatient Prescriptions  Medication Sig Dispense Refill  . albuterol (PROAIR HFA) 108 (90 BASE) MCG/ACT inhaler Inhale 1-2 puffs into the lungs every 6 (six) hours as needed.  1 Inhaler  6  . amLODipine (NORVASC) 2.5 MG tablet TAKE ONE TABLET BY MOUTH EVERY DAY  90 tablet  1  . aspirin 81 MG tablet Take 81 mg by mouth daily.        [name] atorvastatin (LIPITOR) 20 MG tablet Take 1 tablet (20 mg total) by mouth daily.  90 tablet  1  . beclomethasone (QVAR) 80 MCG/ACT inhaler Inhale 2 puffs into the lungs every 12 (twelve) hours. Gargle and swallow after use  8.7 Inhaler  11  . benazepril (LOTENSIN) 40 MG tablet TAKE ONE-HALF TABLET BY MOUTH EVERY DAY  45 tablet  1  . HYDROcodone-acetaminophen (NORCO) 10-325 MG per tablet Take 1 tablet by mouth every 8 (eight) hours as needed.  90 tablet  0  . ipratropium-albuterol (DUONEB) 0.5-2.5 (3) MG/3ML SOLN Take 3 mLs by nebulization every 4 (four) hours as needed. Must not be qid unless during exacerbation. Dx 493.90  360 mL  0  . levothyroxine (SYNTHROID, LEVOTHROID) 75 MCG tablet Take 1 tablet (75 mcg total) by mouth daily before breakfast.  30 tablet  4  . metoprolol tartrate (LOPRESSOR) 25 MG tablet  TAKE ONE TABLET BY MOUTH TWICE DAILY  180 tablet  1  . mometasone-formoterol (DULERA) 200-5 MCG/ACT AERO Inhale 2 puffs into the lungs 2 (two) times daily.  8.8 g  5  . morphine (MS CONTIN) 30 MG 12 hr tablet Take 1 tablet (30 mg total) by mouth at bedtime.  30 tablet  0  . sertraline (ZOLOFT) 50 MG tablet TAKE ONE TABLET BY MOUTH EVERY DAY  30 tablet  5   No current facility-administered medications for this visit.    History   Social History  . Marital Status: Married    Spouse Name: N/A    Number of Children: N/A  . Years of Education: N/A   Occupational History  . Not on file.   Social History Main Topics  . Smoking status: Never Smoker   . Smokeless tobacco: Never Used  . Alcohol Use: No  . Drug Use: No  . Sexual Activity: No   Other Topics Concern  . Not on file   Social History Narrative  . No narrative on file    Family History  Problem Relation Age of Onset  . Heart attack Father   . Heart disease Father   . Heart failure Mother   . Pneumonia Mother   . Hypertension Mother   . Heart disease Mother   . Stroke Maternal Grandfather   .  Hypertension Maternal Grandfather   . Asthma Paternal Uncle     PAT UNCLES  . Heart attack Paternal Uncle     Past Medical History  Diagnosis Date  . Asthma     PFTs, February, 2011, moderate obstructive disease with response to bronchodilators, normal lung volumes, moderate reduction in diffusing capacity  . Obstructive airway disease   . Hyperlipidemia   . Hypertension   . Hypothyroidism     Patient has had in the past that she does not need treatment  . CAD (coronary artery disease)     90% distal LAD in the past  /   nuclear, 2008, no ischemia, ejection fraction 70%  . Atrial septal aneurysm     Echo, 2008  . Pinched nerve     lower back  . Kyphoscoliosis   . Shortness of breath   . Endometriosis 1989    RIGHT TUBE  . Endometriosis 1987    LEFT TUBE/OVARY W FOCAL IN-SITU ENDOMETRIAL ADENOCARCINOMA  . Cancer  2002    DUCTAL CIS--S/P LUMPECTOMY, RADIATION AND 6 WEEKS OF TAMOXIFEN  . Ejection fraction     EF 60%, echo, October, 2008  . Elevated CPK     January, 2014  . D-dimer, elevated     January, 2014    Past Surgical History  Procedure Laterality Date  . Cataract extraction    . Cardiovascular stress test  09/07/05    Nuclear, was negative  . Breast lumpectomy  2003    radiation on right  . Appendectomy  1987    AT TAH  . Total abdominal hysterectomy  1987    LSO, APPENDECTOMY  . Pelvic laparoscopy  1989    RSO, LYSIS OF ADHESIONS  . Oophorectomy      LSO -RSO  . Back surgery      Fusion  . Ankle surgery      Patient Active Problem List   Diagnosis Date Noted  . Elevated CPK   . D-dimer, elevated   . H/O hyperthyroidism 12/20/2011  . Multinodular goiter 12/01/2011  . Asthma   . Hypothyroidism   . CAD (coronary artery disease)   . Ejection fraction   . Atrial septal aneurysm   . Hyperlipidemia   . Hypertension   . Shortness of breath   . Scoliosis   . Spinal stenosis, lumbar region, with neurogenic claudication 07/01/2011  . Lumbar postlaminectomy syndrome 07/01/2011  . Osteoarthritis of right hip 07/01/2011  . Contracture of right hip 07/01/2011  . Endometriosis   . DEPRESSION 12/24/2009  . FATIGUE 12/24/2009  . ESOPHAGEAL REFLUX 04/09/2007  . MIGRAINES, HX OF 05/19/2006    ROS   Patient denies fever, chills, headache, sweats, rash, change in vision, change in hearing, cough, nausea vomiting, urinary symptoms. She is concerned about the abnormal breast finding. All other systems are reviewed and are negative.  PHYSICAL EXAM   Patient is stable today. She walks with a cane. She is oriented to person time and place. Affect is normal. There is no jugulovenous distention. Lungs are clear. Respiratory effort is nonlabored. Cardiac exam reveals S1 and S2. There no clicks or significant murmurs. The abdomen is soft. There is no peripheral edema.    Filed Vitals:    04/05/13 1032  BP: 152/90  Pulse: 65  Height: 5' (1.524 m)  Weight: 167 lb 9.6 oz (76.023 kg)     ASSESSMENT & PLAN

## 2013-04-05 NOTE — Assessment & Plan Note (Signed)
Patient is having some exertional shortness of breath. She does not have any volume overload. I will see if these increase in the amlodipine helps with this.

## 2013-04-05 NOTE — Patient Instructions (Signed)
Increase Amlodipine to 5mg  daily. An Rx has been sent to your pharmacy  Ok to hold Aspirin for any upcoming breast procedure.  You have an appt scheduled with Dr.Katz for 05/27/13 @ 12pm

## 2013-04-05 NOTE — Assessment & Plan Note (Addendum)
The patient has known coronary disease from the past. Her most recent nuclear study raises the question of slight ischemia. However the study was technically difficult. I discussed with her the possibility of more aggressive evaluation. She's had headaches from nitrates in the past. She is on a small dose of amlodipine. This dose will be increased in all follow her clinically. It will be okay for her to proceed with her breast evaluation.

## 2013-04-22 ENCOUNTER — Other Ambulatory Visit: Payer: Self-pay | Admitting: Cardiology

## 2013-04-26 ENCOUNTER — Other Ambulatory Visit: Payer: Self-pay | Admitting: Internal Medicine

## 2013-04-26 NOTE — Telephone Encounter (Signed)
Benazepril refilled per protocol. JG//CMA 

## 2013-04-29 ENCOUNTER — Ambulatory Visit: Payer: Medicare Other | Admitting: Cardiology

## 2013-05-02 ENCOUNTER — Other Ambulatory Visit: Payer: Self-pay | Admitting: *Deleted

## 2013-05-02 MED ORDER — HYDROCODONE-ACETAMINOPHEN 10-325 MG PO TABS: 1.0000 | ORAL_TABLET | Freq: Three times a day (TID) | ORAL | Status: DC | PRN

## 2013-05-02 MED ORDER — MORPHINE SULFATE ER 30 MG PO TBCR: 30.0000 mg | EXTENDED_RELEASE_TABLET | Freq: Every day | ORAL | Status: DC

## 2013-05-02 NOTE — Telephone Encounter (Signed)
RX printed early for controlled medication for the visit with RN on 05/06/13 (to be signed by MD) 

## 2013-05-06 ENCOUNTER — Encounter: Payer: Medicare Other | Admitting: *Deleted

## 2013-05-07 ENCOUNTER — Encounter: Payer: Medicare Other | Attending: Physical Medicine & Rehabilitation | Admitting: *Deleted

## 2013-05-07 ENCOUNTER — Encounter: Payer: Self-pay | Admitting: *Deleted

## 2013-05-07 VITALS — BP 144/59 | HR 69 | Resp 14 | Wt 167.0 lb

## 2013-05-07 DIAGNOSIS — M24551 Contracture, right hip: Secondary | ICD-10-CM

## 2013-05-07 DIAGNOSIS — M1611 Unilateral primary osteoarthritis, right hip: Secondary | ICD-10-CM

## 2013-05-07 DIAGNOSIS — M169 Osteoarthritis of hip, unspecified: Secondary | ICD-10-CM | POA: Insufficient documentation

## 2013-05-07 DIAGNOSIS — M961 Postlaminectomy syndrome, not elsewhere classified: Secondary | ICD-10-CM

## 2013-05-07 DIAGNOSIS — M4120 Other idiopathic scoliosis, site unspecified: Secondary | ICD-10-CM | POA: Insufficient documentation

## 2013-05-07 DIAGNOSIS — M48062 Spinal stenosis, lumbar region with neurogenic claudication: Secondary | ICD-10-CM | POA: Insufficient documentation

## 2013-05-07 DIAGNOSIS — Z79899 Other long term (current) drug therapy: Secondary | ICD-10-CM | POA: Insufficient documentation

## 2013-05-07 DIAGNOSIS — M24559 Contracture, unspecified hip: Secondary | ICD-10-CM | POA: Insufficient documentation

## 2013-05-07 DIAGNOSIS — M1610 Unilateral primary osteoarthritis, unspecified hip: Secondary | ICD-10-CM | POA: Insufficient documentation

## 2013-05-07 DIAGNOSIS — M419 Scoliosis, unspecified: Secondary | ICD-10-CM

## 2013-05-07 NOTE — Patient Instructions (Signed)
Follow up in one month with Dr Letta Pate or Algis Liming PA

## 2013-05-07 NOTE — Progress Notes (Signed)
Here for pill count and medication refills. She missed her appointment yesterday because she was in the ED with her husband who has now been put on the ventilator.  He started out with sinus infection and possible flu which has progressed to pneumonia. MS Contin 30 mg # 30  Fill date  04/10/13  Today NV# 3  Hydrocodone 10-325 # 45  Fill date 05/01/13 Today NV# 74 Pill counts are appropriate.  VSS    Pain level:6  Today her opioid risk score is 0.  Follow up in one month with Dr Letta Pate or PA

## 2013-05-24 ENCOUNTER — Other Ambulatory Visit: Payer: Self-pay | Admitting: Internal Medicine

## 2013-05-24 ENCOUNTER — Encounter: Payer: Self-pay | Admitting: Internal Medicine

## 2013-05-24 NOTE — Telephone Encounter (Signed)
Zoloft refilled per protocol. JG//CMA

## 2013-05-27 ENCOUNTER — Ambulatory Visit (INDEPENDENT_AMBULATORY_CARE_PROVIDER_SITE_OTHER): Payer: Medicare Other | Admitting: Cardiology

## 2013-05-27 ENCOUNTER — Encounter: Payer: Self-pay | Admitting: Cardiology

## 2013-05-27 VITALS — BP 142/69 | HR 62 | Ht 60.0 in | Wt 164.0 lb

## 2013-05-27 DIAGNOSIS — R748 Abnormal levels of other serum enzymes: Secondary | ICD-10-CM

## 2013-05-27 DIAGNOSIS — I2510 Atherosclerotic heart disease of native coronary artery without angina pectoris: Secondary | ICD-10-CM

## 2013-05-27 DIAGNOSIS — E785 Hyperlipidemia, unspecified: Secondary | ICD-10-CM

## 2013-05-27 MED ORDER — ATORVASTATIN CALCIUM 40 MG PO TABS: 40.0000 mg | ORAL_TABLET | Freq: Every day | ORAL | Status: DC

## 2013-05-27 NOTE — Assessment & Plan Note (Signed)
Historically she's had a controlled LDL on 20 of atorvastatin. We do not have recent labs. Guidelines call for 40 mg of atorvastatin and she will be switched to this.

## 2013-05-27 NOTE — Assessment & Plan Note (Signed)
She's feeling better with the increased dose of amlodipine. I will continue to follow her clinically. I'm changing the dose of her atorvastatin based on guidelines.

## 2013-05-27 NOTE — Progress Notes (Signed)
HPI  Patient is seen to followup coronary disease. I saw her last April 05, 2013. Just prior to that she had had a nuclear stress study. There was question of a small area of ischemia. She does have known distal LAD disease from the past. I increased the dose of her amlodipine. She has not had any recurrence of her symptoms. Her symptom was an unusual sensation in the neck. I'm not sure if this really was ischemia or not. She has not had any significant return.  Allergies  Allergen Reactions  . Fluticasone-Salmeterol     REACTION: headache.  She is tolerating Dulera well.    Current Outpatient Prescriptions  Medication Sig Dispense Refill  . albuterol (PROAIR HFA) 108 (90 BASE) MCG/ACT inhaler Inhale 1-2 puffs into the lungs every 6 (six) hours as needed.  1 Inhaler  6  . amLODipine (NORVASC) 5 MG tablet TAKE ONE TABLET BY MOUTH EVERY DAY  30 tablet  11  . aspirin 81 MG tablet Take 81 mg by mouth daily.        [name] atorvastatin (LIPITOR) 20 MG tablet TAKE ONE TABLET BY MOUTH EVERY DAY  90 tablet  2  . beclomethasone (QVAR) 80 MCG/ACT inhaler Inhale 2 puffs into the lungs every 12 (twelve) hours. Gargle and swallow after use  8.7 Inhaler  11  . benazepril (LOTENSIN) 40 MG tablet TAKE ONE-HALF TABLET BY MOUTH ONCE DAILY  45 tablet  0  . HYDROcodone-acetaminophen (NORCO) 10-325 MG per tablet Take 1 tablet by mouth every 8 (eight) hours as needed.  90 tablet  0  . ipratropium-albuterol (DUONEB) 0.5-2.5 (3) MG/3ML SOLN Take 3 mLs by nebulization every 4 (four) hours as needed. Must not be qid unless during exacerbation. Dx 493.90  360 mL  0  . levothyroxine (SYNTHROID, LEVOTHROID) 75 MCG tablet Take 1 tablet (75 mcg total) by mouth daily before breakfast.  30 tablet  4  . metoprolol tartrate (LOPRESSOR) 25 MG tablet TAKE ONE TABLET BY MOUTH TWICE DAILY  180 tablet  1  . mometasone-formoterol (DULERA) 200-5 MCG/ACT AERO Inhale 2 puffs into the lungs 2 (two) times daily.  8.8 g  5  . morphine  (MS CONTIN) 30 MG 12 hr tablet Take 1 tablet (30 mg total) by mouth at bedtime.  30 tablet  0  . sertraline (ZOLOFT) 50 MG tablet TAKE ONE TABLET BY MOUTH ONCE DAILY  30 tablet  0   No current facility-administered medications for this visit.    History   Social History  . Marital Status: Married    Spouse Name: N/A    Number of Children: N/A  . Years of Education: N/A   Occupational History  . Not on file.   Social History Main Topics  . Smoking status: Never Smoker   . Smokeless tobacco: Never Used  . Alcohol Use: No  . Drug Use: No  . Sexual Activity: No   Other Topics Concern  . Not on file   Social History Narrative  . No narrative on file    Family History  Problem Relation Age of Onset  . Heart attack Father   . Heart disease Father   . Heart failure Mother   . Pneumonia Mother   . Hypertension Mother   . Heart disease Mother   . Stroke Maternal Grandfather   . Hypertension Maternal Grandfather   . Asthma Paternal Uncle     PAT UNCLES  . Heart attack Paternal Uncle  Past Medical History  Diagnosis Date  . Asthma     PFTs, February, 2011, moderate obstructive disease with response to bronchodilators, normal lung volumes, moderate reduction in diffusing capacity  . Obstructive airway disease   . Hyperlipidemia   . Hypertension   . Hypothyroidism     Patient has had in the past that she does not need treatment  . CAD (coronary artery disease)     90% distal LAD in the past  /   nuclear, 2008, no ischemia, ejection fraction 70%  . Atrial septal aneurysm     Echo, 2008  . Pinched nerve     lower back  . Kyphoscoliosis   . Shortness of breath   . Endometriosis 1989    RIGHT TUBE  . Endometriosis 1987    LEFT TUBE/OVARY W FOCAL IN-SITU ENDOMETRIAL ADENOCARCINOMA  . Cancer 2002    DUCTAL CIS--S/P LUMPECTOMY, RADIATION AND 6 WEEKS OF TAMOXIFEN  . Ejection fraction     EF 60%, echo, October, 2008  . Elevated CPK     January, 2014  . D-dimer,  elevated     January, 2014    Past Surgical History  Procedure Laterality Date  . Cataract extraction    . Cardiovascular stress test  09/07/05    Nuclear, was negative  . Breast lumpectomy  2003    radiation on right  . Appendectomy  1987    AT TAH  . Total abdominal hysterectomy  1987    LSO, APPENDECTOMY  . Pelvic laparoscopy  1989    RSO, LYSIS OF ADHESIONS  . Oophorectomy      LSO -RSO  . Back surgery      Fusion  . Ankle surgery      Patient Active Problem List   Diagnosis Date Noted  . Elevated CPK   . D-dimer, elevated   . H/O hyperthyroidism 12/20/2011  . Multinodular goiter 12/01/2011  . Asthma   . Hypothyroidism   . CAD (coronary artery disease)   . Ejection fraction   . Atrial septal aneurysm   . Hyperlipidemia   . Hypertension   . Shortness of breath   . Scoliosis   . Spinal stenosis, lumbar region, with neurogenic claudication 07/01/2011  . Lumbar postlaminectomy syndrome 07/01/2011  . Osteoarthritis of right hip 07/01/2011  . Contracture of right hip 07/01/2011  . Endometriosis   . DEPRESSION 12/24/2009  . FATIGUE 12/24/2009  . ESOPHAGEAL REFLUX 04/09/2007  . MIGRAINES, HX OF 05/19/2006    ROS   Patient denies fever, chills, headache, sweats, rash, change in vision, change in hearing, chest pain, cough, nausea vomiting, urinary symptoms. All other systems are reviewed and are negative.  PHYSICAL EXAM   The patient comes in with her rolling chair. She has significant spinal disease. She is overweight. She is oriented to person time and place. Affect is normal. Lungs reveal a few scattered rhonchi. There is no respiratory distress. Cardiac exam reveals S1 and S2. There no clicks or significant murmurs. The abdomen is soft. There is no peripheral edema.  Filed Vitals:   05/27/13 1157  BP: 142/69  Pulse: 62  Height: 5' (1.524 m)  Weight: 164 lb (74.39 kg)     ASSESSMENT & PLAN

## 2013-05-27 NOTE — Patient Instructions (Signed)
Your physician has recommended you make the following change in your medication: increase Atorvastatin to 40 mg at bedtime. You may take 2 of your 20 mg tablets to equal 40 mg until you finish current bottle.  Your physician wants you to follow-up in: 6 months. You will receive a reminder letter in the mail two months in advance. If you don't receive a letter, please call our office to schedule the follow-up appointment.

## 2013-06-07 ENCOUNTER — Ambulatory Visit (HOSPITAL_BASED_OUTPATIENT_CLINIC_OR_DEPARTMENT_OTHER): Payer: Medicare Other | Admitting: Physical Medicine & Rehabilitation

## 2013-06-07 ENCOUNTER — Encounter: Payer: Self-pay | Admitting: Physical Medicine & Rehabilitation

## 2013-06-07 ENCOUNTER — Encounter: Payer: Medicare Other | Attending: Physical Medicine and Rehabilitation

## 2013-06-07 VITALS — BP 143/67 | HR 86 | Resp 14 | Ht 60.0 in | Wt 166.4 lb

## 2013-06-07 DIAGNOSIS — M169 Osteoarthritis of hip, unspecified: Secondary | ICD-10-CM

## 2013-06-07 DIAGNOSIS — I2510 Atherosclerotic heart disease of native coronary artery without angina pectoris: Secondary | ICD-10-CM | POA: Insufficient documentation

## 2013-06-07 DIAGNOSIS — M4120 Other idiopathic scoliosis, site unspecified: Secondary | ICD-10-CM | POA: Insufficient documentation

## 2013-06-07 DIAGNOSIS — M48062 Spinal stenosis, lumbar region with neurogenic claudication: Secondary | ICD-10-CM

## 2013-06-07 DIAGNOSIS — F329 Major depressive disorder, single episode, unspecified: Secondary | ICD-10-CM | POA: Insufficient documentation

## 2013-06-07 DIAGNOSIS — F3289 Other specified depressive episodes: Secondary | ICD-10-CM | POA: Insufficient documentation

## 2013-06-07 DIAGNOSIS — G8929 Other chronic pain: Secondary | ICD-10-CM | POA: Insufficient documentation

## 2013-06-07 DIAGNOSIS — M545 Low back pain, unspecified: Secondary | ICD-10-CM | POA: Insufficient documentation

## 2013-06-07 DIAGNOSIS — Z981 Arthrodesis status: Secondary | ICD-10-CM | POA: Insufficient documentation

## 2013-06-07 DIAGNOSIS — G43909 Migraine, unspecified, not intractable, without status migrainosus: Secondary | ICD-10-CM | POA: Insufficient documentation

## 2013-06-07 DIAGNOSIS — M961 Postlaminectomy syndrome, not elsewhere classified: Secondary | ICD-10-CM

## 2013-06-07 DIAGNOSIS — M419 Scoliosis, unspecified: Secondary | ICD-10-CM

## 2013-06-07 DIAGNOSIS — M1610 Unilateral primary osteoarthritis, unspecified hip: Secondary | ICD-10-CM

## 2013-06-07 DIAGNOSIS — M1611 Unilateral primary osteoarthritis, right hip: Secondary | ICD-10-CM

## 2013-06-07 MED ORDER — MORPHINE SULFATE ER 30 MG PO TBCR: 30.0000 mg | EXTENDED_RELEASE_TABLET | Freq: Every day | ORAL | Status: DC

## 2013-06-07 MED ORDER — HYDROCODONE-ACETAMINOPHEN 10-325 MG PO TABS: 1.0000 | ORAL_TABLET | Freq: Three times a day (TID) | ORAL | Status: DC | PRN

## 2013-06-07 NOTE — Patient Instructions (Signed)
Please stay active.  Walk 10 min 3 times aday

## 2013-06-07 NOTE — Progress Notes (Signed)
Subjective:    Patient ID: Hannah Scott, female    DOB: 06/30/1950, 63 y.o.   MRN: [id number]  HPI   Pain Inventory Average Pain 9 Pain Right Now 7 My pain is constant, burning, tingling and aching  In the last 24 hours, has pain interfered with the following? General activity 10 Relation with others 10 Enjoyment of life 10 What TIME of day is your pain at its worst? all Sleep (in general) Poor  Pain is worse with: walking and standing Pain improves with: rest and medication Relief from Meds: 6  Mobility use a cane use a walker  Function I need assistance with the following:  household duties and shopping  Neuro/Psych weakness numbness  Prior Studies Any changes since last visit?  no  Physicians involved in your care Any changes since last visit?  no   Family History  Problem Relation Age of Onset  . Heart attack Father   . Heart disease Father   . Heart failure Mother   . Pneumonia Mother   . Hypertension Mother   . Heart disease Mother   . Stroke Maternal Grandfather   . Hypertension Maternal Grandfather   . Asthma Paternal Uncle     PAT UNCLES  . Heart attack Paternal Uncle    History   Social History  . Marital Status: Married    Spouse Name: N/A    Number of Children: N/A  . Years of Education: N/A   Social History Main Topics  . Smoking status: Never Smoker   . Smokeless tobacco: Never Used  . Alcohol Use: No  . Drug Use: No  . Sexual Activity: No   Other Topics Concern  . None   Social History Narrative  . None   Past Surgical History  Procedure Laterality Date  . Cataract extraction    . Cardiovascular stress test  09/07/05    Nuclear, was negative  . Breast lumpectomy  2003    radiation on right  . Appendectomy  1987    AT TAH  . Total abdominal hysterectomy  1987    LSO, APPENDECTOMY  . Pelvic laparoscopy  1989    RSO, LYSIS OF ADHESIONS  . Oophorectomy      LSO -RSO  . Back surgery      Fusion  . Ankle surgery       Past Medical History  Diagnosis Date  . Asthma     PFTs, February, 2011, moderate obstructive disease with response to bronchodilators, normal lung volumes, moderate reduction in diffusing capacity  . Obstructive airway disease   . Hyperlipidemia   . Hypertension   . Hypothyroidism     Patient has had in the past that she does not need treatment  . CAD (coronary artery disease)     90% distal LAD in the past  /   nuclear, 2008, no ischemia, ejection fraction 70%  . Atrial septal aneurysm     Echo, 2008  . Pinched nerve     lower back  . Kyphoscoliosis   . Shortness of breath   . Endometriosis 1989    RIGHT TUBE  . Endometriosis 1987    LEFT TUBE/OVARY W FOCAL IN-SITU ENDOMETRIAL ADENOCARCINOMA  . Cancer 2002    DUCTAL CIS--S/P LUMPECTOMY, RADIATION AND 6 WEEKS OF TAMOXIFEN  . Ejection fraction     EF 60%, echo, October, 2008  . Elevated CPK     January, 2014  . D-dimer, elevated     January,  2014   BP 143/67  Pulse 86  Resp 14  Ht 5' (1.524 m)  Wt 166 lb 6.4 oz (75.479 kg)  BMI 32.50 kg/m2  SpO2 92%  Opioid Risk Score:   Fall Risk Score: Moderate Fall Risk (6-13 points) (educated and home fallrisk prevention handout given) Review of Systems  Musculoskeletal: Positive for back pain.  Neurological: Positive for weakness and numbness.  All other systems reviewed and are negative.       Objective:   Physical Exam Severe right hip flexion contracture Ambulates in a forward flexed posture from both the hip area as well as from thoracic kyphosis, right foot with toe-touch secondary to leg length discrepancy Motor strength normal right knee extensor ankle dorsiflexor as well as on the left. 4/5 bilateral hip flexors.        Assessment & Plan:  1.  Lumbar post laminectomy syndrome, s/p lumbar fusion  2.  R hip OA has been putting off surgery, Will call Dr Lorin Mercy when she is ready to schedule, patient will also notify our clinic if she decides to have hip  surgery

## 2013-06-14 ENCOUNTER — Ambulatory Visit: Payer: Medicare Other | Admitting: Endocrinology

## 2013-06-24 ENCOUNTER — Other Ambulatory Visit: Payer: Self-pay | Admitting: *Deleted

## 2013-06-24 MED ORDER — BENAZEPRIL HCL 40 MG PO TABS: ORAL_TABLET | ORAL | Status: DC

## 2013-06-24 MED ORDER — SERTRALINE HCL 50 MG PO TABS: ORAL_TABLET | ORAL | Status: DC

## 2013-06-24 NOTE — Telephone Encounter (Signed)
Rx's sent to the pharmacy by e-script.//AB/CMA 

## 2013-07-02 ENCOUNTER — Encounter: Payer: Self-pay | Admitting: Endocrinology

## 2013-07-02 ENCOUNTER — Ambulatory Visit (INDEPENDENT_AMBULATORY_CARE_PROVIDER_SITE_OTHER): Payer: Medicare Other | Admitting: Endocrinology

## 2013-07-02 VITALS — BP 130/86 | HR 77 | Temp 98.5°F | Ht 60.0 in | Wt 166.0 lb

## 2013-07-02 DIAGNOSIS — E890 Postprocedural hypothyroidism: Secondary | ICD-10-CM

## 2013-07-02 LAB — TSH: TSH: 0.24 u[IU]/mL — AB (ref 0.35–5.50)

## 2013-07-02 NOTE — Progress Notes (Signed)
Subjective:    Patient ID: Hannah Scott, female    DOB: 08-12-1950, 63 y.o.   MRN: [id number]  HPI Pt had i-131 rx for hyperthyroidism, due to multinodular goiter, in September, 2013.  In early 2014, TSH was high, and synthroid was started.  She has depression, due to the recent death of her husband.   Past Medical History  Diagnosis Date  . Asthma     PFTs, February, 2011, moderate obstructive disease with response to bronchodilators, normal lung volumes, moderate reduction in diffusing capacity  . Obstructive airway disease   . Hyperlipidemia   . Hypertension   . Hypothyroidism     Patient has had in the past that she does not need treatment  . CAD (coronary artery disease)     90% distal LAD in the past  /   nuclear, 2008, no ischemia, ejection fraction 70%  . Atrial septal aneurysm     Echo, 2008  . Pinched nerve     lower back  . Kyphoscoliosis   . Shortness of breath   . Endometriosis 1989    RIGHT TUBE  . Endometriosis 1987    LEFT TUBE/OVARY W FOCAL IN-SITU ENDOMETRIAL ADENOCARCINOMA  . Cancer 2002    DUCTAL CIS--S/P LUMPECTOMY, RADIATION AND 6 WEEKS OF TAMOXIFEN  . Ejection fraction     EF 60%, echo, October, 2008  . Elevated CPK     January, 2014  . D-dimer, elevated     January, 2014    Past Surgical History  Procedure Laterality Date  . Cataract extraction    . Cardiovascular stress test  09/07/05    Nuclear, was negative  . Breast lumpectomy  2003    radiation on right  . Appendectomy  1987    AT TAH  . Total abdominal hysterectomy  1987    LSO, APPENDECTOMY  . Pelvic laparoscopy  1989    RSO, LYSIS OF ADHESIONS  . Oophorectomy      LSO -RSO  . Back surgery      Fusion  . Ankle surgery      History   Social History  . Marital Status: Married    Spouse Name: N/A    Number of Children: N/A  . Years of Education: N/A   Occupational History  . Not on file.   Social History Main Topics  . Smoking status: Never Smoker   . Smokeless tobacco:  Never Used  . Alcohol Use: No  . Drug Use: No  . Sexual Activity: No   Other Topics Concern  . Not on file   Social History Narrative  . No narrative on file    Current Outpatient Prescriptions on File Prior to Visit  Medication Sig Dispense Refill  . albuterol (PROAIR HFA) 108 (90 BASE) MCG/ACT inhaler Inhale 1-2 puffs into the lungs every 6 (six) hours as needed.  1 Inhaler  6  . amLODipine (NORVASC) 5 MG tablet TAKE ONE TABLET BY MOUTH EVERY DAY  30 tablet  11  . aspirin 81 MG tablet Take 81 mg by mouth daily.        [name] atorvastatin (LIPITOR) 40 MG tablet Take 1 tablet (40 mg total) by mouth at bedtime.  90 tablet  2  . beclomethasone (QVAR) 80 MCG/ACT inhaler Inhale 2 puffs into the lungs every 12 (twelve) hours. Gargle and swallow after use  8.7 Inhaler  11  . benazepril (LOTENSIN) 40 MG tablet PT NEEDS COMPLETE PHYSICAL.  TAKE ONE-HALF TABLET BY  MOUTH ONCE DAILY.  25 tablet  0  . ipratropium-albuterol (DUONEB) 0.5-2.5 (3) MG/3ML SOLN Take 3 mLs by nebulization every 4 (four) hours as needed. Must not be qid unless during exacerbation. Dx 493.90  360 mL  0  . metoprolol tartrate (LOPRESSOR) 25 MG tablet TAKE ONE TABLET BY MOUTH TWICE DAILY  180 tablet  1  . mometasone-formoterol (DULERA) 200-5 MCG/ACT AERO Inhale 2 puffs into the lungs 2 (two) times daily.  8.8 g  5  . sertraline (ZOLOFT) 50 MG tablet PT NEEDS COMPLETE PHYSICAL.  TAKE ONE TABLET BY MOUTH ONCE DAILY.  15 tablet  0   No current facility-administered medications on file prior to visit.    Allergies  Allergen Reactions  . Fluticasone-Salmeterol     REACTION: headache.  She is tolerating Dulera well.    Family History  Problem Relation Age of Onset  . Heart attack Father   . Heart disease Father   . Heart failure Mother   . Pneumonia Mother   . Hypertension Mother   . Heart disease Mother   . Stroke Maternal Grandfather   . Hypertension Maternal Grandfather   . Asthma Paternal Uncle     PAT UNCLES  .  Heart attack Paternal Uncle     BP 130/86  Pulse 77  Temp(Src) 98.5 F (36.9 C) (Oral)  Ht 5' (1.524 m)  Wt 166 lb (75.297 kg)  BMI 32.42 kg/m2  SpO2 94%  Review of Systems She has hair loss.      Objective:   Physical Exam VITAL SIGNS:  See vs page GENERAL: no distress NECK: There is no palpable thyroid enlargement.  No thyroid nodule is palpable.  No palpable lymphadenopathy at the anterior neck.    Lab Results  Component Value Date   TSH 0.24* 07/02/2013      Assessment & Plan:  Post-i-131 hypothyroidism: slightly overreplaced.

## 2013-07-02 NOTE — Patient Instructions (Signed)
A thyroid blood test is being requested for you today.  We'll contact you with results. Please come back for a follow-up appointment in 3 months.    Most of the time, your thyroid will start working more or less than it is now.  This means that your need for the levothyroxinw will go up or down with time, bult seldom stays the same.

## 2013-07-03 ENCOUNTER — Other Ambulatory Visit: Payer: Self-pay | Admitting: *Deleted

## 2013-07-03 MED ORDER — HYDROCODONE-ACETAMINOPHEN 10-325 MG PO TABS: 1.0000 | ORAL_TABLET | Freq: Three times a day (TID) | ORAL | Status: DC | PRN

## 2013-07-03 MED ORDER — LEVOTHYROXINE SODIUM 50 MCG PO TABS: 50.0000 ug | ORAL_TABLET | Freq: Every day | ORAL | Status: DC

## 2013-07-03 MED ORDER — MORPHINE SULFATE ER 30 MG PO TBCR: 30.0000 mg | EXTENDED_RELEASE_TABLET | Freq: Every day | ORAL | Status: DC

## 2013-07-03 NOTE — Telephone Encounter (Signed)
RX printed for MD to sign for RN visit 07/05/13 

## 2013-07-05 ENCOUNTER — Encounter: Payer: Self-pay | Admitting: *Deleted

## 2013-07-05 ENCOUNTER — Encounter: Payer: Medicare Other | Attending: Physical Medicine & Rehabilitation | Admitting: *Deleted

## 2013-07-05 VITALS — BP 133/59 | HR 67 | Resp 14

## 2013-07-05 DIAGNOSIS — Z981 Arthrodesis status: Secondary | ICD-10-CM | POA: Insufficient documentation

## 2013-07-05 DIAGNOSIS — M4120 Other idiopathic scoliosis, site unspecified: Secondary | ICD-10-CM | POA: Insufficient documentation

## 2013-07-05 DIAGNOSIS — I2510 Atherosclerotic heart disease of native coronary artery without angina pectoris: Secondary | ICD-10-CM | POA: Insufficient documentation

## 2013-07-05 DIAGNOSIS — M545 Low back pain, unspecified: Secondary | ICD-10-CM | POA: Insufficient documentation

## 2013-07-05 DIAGNOSIS — M419 Scoliosis, unspecified: Secondary | ICD-10-CM

## 2013-07-05 DIAGNOSIS — J45909 Unspecified asthma, uncomplicated: Secondary | ICD-10-CM | POA: Insufficient documentation

## 2013-07-05 DIAGNOSIS — E785 Hyperlipidemia, unspecified: Secondary | ICD-10-CM | POA: Insufficient documentation

## 2013-07-05 DIAGNOSIS — M961 Postlaminectomy syndrome, not elsewhere classified: Secondary | ICD-10-CM | POA: Insufficient documentation

## 2013-07-05 DIAGNOSIS — G8929 Other chronic pain: Secondary | ICD-10-CM | POA: Insufficient documentation

## 2013-07-05 DIAGNOSIS — M1611 Unilateral primary osteoarthritis, right hip: Secondary | ICD-10-CM

## 2013-07-05 DIAGNOSIS — M48062 Spinal stenosis, lumbar region with neurogenic claudication: Secondary | ICD-10-CM

## 2013-07-05 DIAGNOSIS — E039 Hypothyroidism, unspecified: Secondary | ICD-10-CM | POA: Insufficient documentation

## 2013-07-05 DIAGNOSIS — I10 Essential (primary) hypertension: Secondary | ICD-10-CM | POA: Insufficient documentation

## 2013-07-05 NOTE — Patient Instructions (Signed)
Follow up next month with RN for med refill and 2 months with Dr Letta Pate

## 2013-07-05 NOTE — Progress Notes (Signed)
Here for pill count and medication refills.  Hydrocodone 10/325 # 37 Fill date 06/29/13   Today NV# 66  MS Contin 30 mg # 30  Fill date 06/08/13 Today NV# 4  Pill counts are appropriate. Refills given   VSS   She has had no falls.  She is a moderate fall risk and walks with a cane.  She was educated and given a handout on previous visit.  She has been dealing with tying up loose ends with her husbands death in 05-14-22.  We talked about the possibility of getting a pet to keep her company.  She said her kids have talked about it with her too.  She will return next month for a med refill and then two months with Dr Letta Pate.

## 2013-07-08 ENCOUNTER — Encounter: Payer: Self-pay | Admitting: Internal Medicine

## 2013-07-08 ENCOUNTER — Other Ambulatory Visit (INDEPENDENT_AMBULATORY_CARE_PROVIDER_SITE_OTHER): Payer: Medicare Other

## 2013-07-08 ENCOUNTER — Ambulatory Visit (INDEPENDENT_AMBULATORY_CARE_PROVIDER_SITE_OTHER): Payer: Medicare Other | Admitting: Internal Medicine

## 2013-07-08 VITALS — BP 152/92 | HR 72 | Temp 98.5°F | Resp 14 | Wt 166.0 lb

## 2013-07-08 DIAGNOSIS — E785 Hyperlipidemia, unspecified: Secondary | ICD-10-CM

## 2013-07-08 DIAGNOSIS — I10 Essential (primary) hypertension: Secondary | ICD-10-CM

## 2013-07-08 DIAGNOSIS — J45909 Unspecified asthma, uncomplicated: Secondary | ICD-10-CM

## 2013-07-08 DIAGNOSIS — F3289 Other specified depressive episodes: Secondary | ICD-10-CM

## 2013-07-08 DIAGNOSIS — F329 Major depressive disorder, single episode, unspecified: Secondary | ICD-10-CM

## 2013-07-08 LAB — LIPID PANEL
CHOL/HDL RATIO: 2
CHOLESTEROL: 164 mg/dL (ref 0–200)
HDL: 74.5 mg/dL (ref >39.00)
LDL Cholesterol: 79 mg/dL (ref 0–99)
Triglycerides: 51 mg/dL (ref 0.0–149.0)
VLDL: 10.2 mg/dL (ref 0.0–40.0)

## 2013-07-08 LAB — BASIC METABOLIC PANEL
BUN: 15 mg/dL (ref 6–23)
CO2: 30 meq/L (ref 19–32)
CREATININE: 0.6 mg/dL (ref 0.4–1.2)
Calcium: 9.3 mg/dL (ref 8.4–10.5)
Chloride: 102 mEq/L (ref 96–112)
GFR: 103.03 mL/min (ref >60.00)
Glucose, Bld: 96 mg/dL (ref 70–99)
POTASSIUM: 4.3 meq/L (ref 3.5–5.1)
Sodium: 138 mEq/L (ref 135–145)

## 2013-07-08 LAB — HEPATIC FUNCTION PANEL
ALK PHOS: 73 U/L (ref 39–117)
ALT: 22 U/L (ref 0–35)
AST: 26 U/L (ref 0–37)
Albumin: 4.4 g/dL (ref 3.5–5.2)
BILIRUBIN TOTAL: 0.6 mg/dL (ref 0.3–1.2)
Bilirubin, Direct: 0.1 mg/dL (ref 0.0–0.3)
Total Protein: 7.1 g/dL (ref 6.0–8.3)

## 2013-07-08 LAB — CK: Total CK: 110 U/L (ref 7–177)

## 2013-07-08 MED ORDER — SERTRALINE HCL 50 MG PO TABS: ORAL_TABLET | ORAL | Status: DC

## 2013-07-08 MED ORDER — BENAZEPRIL HCL 40 MG PO TABS: ORAL_TABLET | ORAL | Status: DC

## 2013-07-08 NOTE — Assessment & Plan Note (Signed)
Qvar sample

## 2013-07-08 NOTE — Patient Instructions (Signed)
Your next office appointment will be determined based upon review of your pending labs . Those instructions will be transmitted to you through My Chart.   Minimal Blood Pressure Goal= AVERAGE < 140/90;  Ideal is an AVERAGE < 135/85. This AVERAGE should be calculated from @ least 5-7 BP readings taken @ different times of day on different days of week. You should not respond to isolated BP readings , but rather the AVERAGE for that week .Please bring your  blood pressure cuff to office visits to verify that it is reliable.It  can also be checked against the blood pressure device at the pharmacy. Finger or wrist cuffs are not dependable; an arm cuff is. 

## 2013-07-08 NOTE — Assessment & Plan Note (Signed)
Lipids, LFTs,CK 

## 2013-07-08 NOTE — Progress Notes (Signed)
   Subjective:    Patient ID: Hannah Scott, female    DOB: 09/30/1950, 63 y.o.   MRN: [id number]  HPI Overall she is feeling well. Uses albuterol very sporadically. States she does have fatigue which is related to her thyroid she believes.Dr Hannah Scott is addressing this. Blood pressure range / average :120+/75; always < 130+/85  Compliant with anti hypertemsive medication. No lightheadedness or other adverse medication effect described.  A heart healthy /low salt diet is followed. Not exercising due to back issues.  She is unsure whether sertraline is helping her mood as she just lost her husband.She questions increasing it. Counseling except with minister declined.  She wants lipids checked, not done by Dr Hannah Scott recently.      Review of Systems  Constitutional: Positive for fatigue. Negative for chills and diaphoresis.  Respiratory: Positive for shortness of breath (on exertion.).  No nocturnal awakening from shortness of breath.  Cardiovascular: Positive for palpitations (Occasional). Negative for leg swelling.  Significant headaches, epistaxis, chest pain,or  claudication absent. Neurological: Negative for dizziness and light-headedness.         Objective:   Physical Exam  She appears adequately nourished. No distress.  HENT:  Head: Normocephalic and atraumatic.  Mouth/Throat: No oropharyngeal exudate.  Eyes: Pupils are equal, round, and reactive to light. Right eye exhibits clear, non purulent discharge. Left eye exhibits no discharge. No scleral icterus.  Neck: No thyromegaly present. No carotid bruits. Cardiovascular: Normal rate, regular rhythm and normal heart sounds. Exam reveals no gallop and no friction rub.  No murmur heard.  Pulmonary/Chest: Effort normal and breath sounds normal.  Lymphadenopathy:  She has no cervical adenopathy.  MS: severe kyphoscoliosis with dramatic loss of height Neurological: She is alert and oriented to person, place, and time.  Skin:  Skin is warm and dry.  Psychiatric: She has a depressed mood and affect. Intermittent tearing up.Her behavior is normal.            Assessment & Plan:  See Current Assessment & Plan in Problem List under specific Diagnosis

## 2013-07-08 NOTE — Progress Notes (Signed)
   Subjective:    Patient ID: Hannah Scott, female    DOB: 01-16-1951, 63 y.o.   MRN: [id number]  HPI Comments: Here for med refill. Overall she is feeling well. Uses albuterol very sporadically. States she does have fatigue which is related to her thyroid she believes.    Review of Systems  Constitutional: Positive for fatigue. Negative for chills and diaphoresis.  Respiratory: Positive for shortness of breath (on exertion.).        No nocturnal wakening from shortness of breath.  Cardiovascular: Positive for palpitations (Occasional). Negative for leg swelling.  Neurological: Negative for dizziness and light-headedness.       Objective:   Physical Exam  Constitutional: She is oriented to person, place, and time. She appears well-developed and well-nourished. No distress.  HENT:  Head: Normocephalic and atraumatic.  Mouth/Throat: No oropharyngeal exudate.  Eyes: Pupils are equal, round, and reactive to light. Right eye exhibits discharge. Left eye exhibits no discharge. No scleral icterus.  Neck: No thyromegaly present.  Cardiovascular: Normal rate, regular rhythm and normal heart sounds.  Exam reveals no gallop and no friction rub.   No murmur heard. Pulmonary/Chest: Effort normal and breath sounds normal.  Lymphadenopathy:    She has no cervical adenopathy.  Neurological: She is alert and oriented to person, place, and time.  Skin: Skin is warm and dry.  Psychiatric: She has a normal mood and affect. Her behavior is normal.          Assessment & Plan:

## 2013-07-08 NOTE — Assessment & Plan Note (Signed)
Refill meds BMET

## 2013-07-08 NOTE — Assessment & Plan Note (Signed)
Options discussed Renew Zoloft

## 2013-07-29 ENCOUNTER — Encounter: Payer: Self-pay | Admitting: Internal Medicine

## 2013-07-30 NOTE — Telephone Encounter (Signed)
Message copied by Harl Bowie on Tue Jul 30, 2013  2:19 PM ------      Message from: Hendricks Limes      Created: Mon Jul 29, 2013  6:52 PM       Please week sample of the high dose [facility] @ the front if that is available. ------

## 2013-07-30 NOTE — Telephone Encounter (Signed)
Spoke with the pt and informed her that we have a sample of the [facility] and I will leave it up front for her to pick-up.  Pt understood and agreed.//AB/CMA

## 2013-08-05 ENCOUNTER — Encounter: Payer: Medicare Other | Attending: Physical Medicine & Rehabilitation | Admitting: Registered Nurse

## 2013-08-05 ENCOUNTER — Encounter: Payer: Self-pay | Admitting: Registered Nurse

## 2013-08-05 VITALS — BP 150/75 | HR 64 | Resp 14 | Ht 60.0 in | Wt 166.0 lb

## 2013-08-05 DIAGNOSIS — M169 Osteoarthritis of hip, unspecified: Secondary | ICD-10-CM

## 2013-08-05 DIAGNOSIS — M4120 Other idiopathic scoliosis, site unspecified: Secondary | ICD-10-CM

## 2013-08-05 DIAGNOSIS — M1610 Unilateral primary osteoarthritis, unspecified hip: Secondary | ICD-10-CM

## 2013-08-05 DIAGNOSIS — M1611 Unilateral primary osteoarthritis, right hip: Secondary | ICD-10-CM

## 2013-08-05 DIAGNOSIS — Z79899 Other long term (current) drug therapy: Secondary | ICD-10-CM | POA: Insufficient documentation

## 2013-08-05 DIAGNOSIS — M961 Postlaminectomy syndrome, not elsewhere classified: Secondary | ICD-10-CM

## 2013-08-05 DIAGNOSIS — M419 Scoliosis, unspecified: Secondary | ICD-10-CM

## 2013-08-05 DIAGNOSIS — M24559 Contracture, unspecified hip: Secondary | ICD-10-CM | POA: Insufficient documentation

## 2013-08-05 DIAGNOSIS — M48062 Spinal stenosis, lumbar region with neurogenic claudication: Secondary | ICD-10-CM

## 2013-08-05 MED ORDER — TRAZODONE HCL 50 MG PO TABS
50.0000 mg | ORAL_TABLET | Freq: Every day | ORAL | Status: DC
Start: 2013-08-05 — End: 2013-10-21

## 2013-08-05 MED ORDER — HYDROCODONE-ACETAMINOPHEN 10-325 MG PO TABS: 1.0000 | ORAL_TABLET | Freq: Three times a day (TID) | ORAL | Status: DC | PRN

## 2013-08-05 MED ORDER — MORPHINE SULFATE ER 30 MG PO TBCR: 30.0000 mg | EXTENDED_RELEASE_TABLET | Freq: Every day | ORAL | Status: DC

## 2013-08-05 NOTE — Patient Instructions (Signed)
Trazodone ordered for insomnia. Start taking a half of tablet for four days if not affective use a whole tablet as needed. Call office with any questions.

## 2013-08-05 NOTE — Progress Notes (Signed)
Subjective:    Patient ID: Hannah Scott, female    DOB: [birth year], 63 y.o.   MRN: [id number]  HPI: [name] is a 63 year old female who returns for follow up for chronic pain and medication refill. She says her pain is in her lower back. Prolong standing and walking aggravates her pain. She rates her pain 7, she describes her pain as constant, burning, tingling and aching. She's taking short walks for her exercise regime with frequent rest periods.  She's living a sedentary lifestyle, with the loss of her husband and the pain she feels has lead to decrease activity. She has become stressed and depressed with her overall condition. Admits to feeling anxious at times. She became tearful when speaking about how she feels. Emotional support given. She denies anti-anxiety medication at this time. She is on Zoloft, will speak to her primary about increasing the dose. She's having insomnia, and will start trazodone. Will start at 25 mg hs for four days will increase dose to 50 mg if needed.   Pain Inventory Average Pain 7  Pain Right Now 7 My pain is constant, burning, tingling and aching  In the last 24 hours, has pain interfered with the following? General activity 10 Relation with others 10 Enjoyment of life 10 What TIME of day is your pain at its worst? n/a Sleep (in general) Poor  Pain is worse with: walking and standing Pain improves with: rest and medication Relief from Meds: 6  Mobility use a cane ability to climb steps?  yes do you drive?  yes  Function disabled: date disabled 2008  Neuro/Psych weakness numbness tingling depression  Prior Studies Any changes since last visit?  no  Physicians involved in your care Any changes since last visit?  no   Family History  Problem Relation Age of Onset  . Heart attack Father   . Heart disease Father   . Heart failure Mother   . Pneumonia Mother   . Hypertension Mother   . Heart disease Mother   . Stroke Maternal  Grandfather   . Hypertension Maternal Grandfather   . Asthma Paternal Uncle     PAT UNCLES  . Heart attack Paternal Uncle    History   Social History  . Marital Status: Married    Spouse Name: N/A    Number of Children: N/A  . Years of Education: N/A   Social History Main Topics  . Smoking status: Never Smoker   . Smokeless tobacco: Never Used  . Alcohol Use: No  . Drug Use: No  . Sexual Activity: No   Other Topics Concern  . None   Social History Narrative  . None   Past Surgical History  Procedure Laterality Date  . Cataract extraction    . Cardiovascular stress test  09/07/05    Nuclear, was negative  . Breast lumpectomy  2003    radiation on right  . Appendectomy  1987    AT [facility]  . Total abdominal hysterectomy  1987    LSO, APPENDECTOMY  . Pelvic laparoscopy  1989    RSO, LYSIS OF ADHESIONS  . Oophorectomy      LSO -RSO  . Back surgery      Fusion  . Ankle surgery     Past Medical History  Diagnosis Date  . Asthma     PFTs, February, 2011, moderate obstructive disease with response to bronchodilators, normal lung volumes, moderate reduction in diffusing capacity  .  Obstructive airway disease   . Hyperlipidemia   . Hypertension   . Hypothyroidism     Patient has had in the past that she does not need treatment  . CAD (coronary artery disease)     90% distal LAD in the past  /   nuclear, 2008, no ischemia, ejection fraction 70%  . Atrial septal aneurysm     Echo, 2008  . Pinched nerve     lower back  . Kyphoscoliosis   . Shortness of breath   . Endometriosis 1989    RIGHT TUBE  . Endometriosis 1987    LEFT TUBE/OVARY W FOCAL IN-SITU ENDOMETRIAL ADENOCARCINOMA  . Cancer 2002    DUCTAL CIS--S/P LUMPECTOMY, RADIATION AND 6 WEEKS OF TAMOXIFEN  . Ejection fraction     EF 60%, echo, October, 2008  . Elevated CPK     January, 2014  . D-dimer, elevated     January, 2014   BP 150/75  Pulse 64  Resp 14  Ht 5' (1.524 m)  Wt 166 lb (75.297 kg)   BMI 32.42 kg/m2  SpO2 96%  Opioid Risk Score:   Fall Risk Score: Moderate Fall Risk (6-13 points) (patient educated handout declined)   Review of Systems  Musculoskeletal: Positive for back pain.  Neurological: Positive for weakness and numbness.       Tingling  Psychiatric/Behavioral: Positive for dysphoric mood.  All other systems reviewed and are negative.      Objective:   Physical Exam  Nursing note and vitals reviewed. Constitutional: She appears well-developed and well-nourished.  HENT:  Head: Normocephalic.  Neck: Normal range of motion. Neck supple.  Cardiovascular: Normal rate, regular rhythm and normal heart sounds.   Pulmonary/Chest: Effort normal and breath sounds normal.  Musculoskeletal:  Normal Muscle Bulk: Muscle Testing Reveals:  Upper Extremities 5/5. Right Lower Extremity with decreased ROM. Right hip pain radiates to Right Knee. Left Leg Flexion: Tenderness notes. Antalgic Gait: Narrow Gait, uses a cane to walk. Ambulates in a forward flexed posture.  Thoracic Kyphosis           Assessment & Plan:  1. Lumbar post laminectomy syndrome, s/p lumbar fusion:  Refilled: Hydrocodone 10/325mg  one tablet every 8 hours #90, MS CONTIN 30 mg HS #30 2. Right Hip OA: Surgery Pending. 3.Depression: On Zoloft. Encourage Counseling and speaking to her clergy. Has Family Support and Friends.  30 minutes of face to face patient care time was spent during this visit. All questions were encouraged and answered.

## 2013-08-12 ENCOUNTER — Other Ambulatory Visit: Payer: Self-pay

## 2013-08-12 MED ORDER — METOPROLOL TARTRATE 25 MG PO TABS: ORAL_TABLET | ORAL | Status: DC

## 2013-09-03 ENCOUNTER — Encounter: Payer: Self-pay | Admitting: Physical Medicine & Rehabilitation

## 2013-09-03 ENCOUNTER — Encounter: Payer: Medicare Other | Attending: Physical Medicine and Rehabilitation

## 2013-09-03 ENCOUNTER — Ambulatory Visit (HOSPITAL_BASED_OUTPATIENT_CLINIC_OR_DEPARTMENT_OTHER): Payer: Medicare Other | Admitting: Physical Medicine & Rehabilitation

## 2013-09-03 VITALS — BP 150/74 | HR 88 | Resp 14 | Wt 171.0 lb

## 2013-09-03 DIAGNOSIS — M1610 Unilateral primary osteoarthritis, unspecified hip: Secondary | ICD-10-CM

## 2013-09-03 DIAGNOSIS — G8929 Other chronic pain: Secondary | ICD-10-CM | POA: Insufficient documentation

## 2013-09-03 DIAGNOSIS — I2510 Atherosclerotic heart disease of native coronary artery without angina pectoris: Secondary | ICD-10-CM | POA: Insufficient documentation

## 2013-09-03 DIAGNOSIS — M48062 Spinal stenosis, lumbar region with neurogenic claudication: Secondary | ICD-10-CM

## 2013-09-03 DIAGNOSIS — F329 Major depressive disorder, single episode, unspecified: Secondary | ICD-10-CM | POA: Insufficient documentation

## 2013-09-03 DIAGNOSIS — G43909 Migraine, unspecified, not intractable, without status migrainosus: Secondary | ICD-10-CM | POA: Insufficient documentation

## 2013-09-03 DIAGNOSIS — M1611 Unilateral primary osteoarthritis, right hip: Secondary | ICD-10-CM

## 2013-09-03 DIAGNOSIS — F3289 Other specified depressive episodes: Secondary | ICD-10-CM | POA: Insufficient documentation

## 2013-09-03 DIAGNOSIS — M545 Low back pain, unspecified: Secondary | ICD-10-CM | POA: Insufficient documentation

## 2013-09-03 DIAGNOSIS — M4120 Other idiopathic scoliosis, site unspecified: Secondary | ICD-10-CM | POA: Insufficient documentation

## 2013-09-03 DIAGNOSIS — M961 Postlaminectomy syndrome, not elsewhere classified: Secondary | ICD-10-CM

## 2013-09-03 DIAGNOSIS — M169 Osteoarthritis of hip, unspecified: Secondary | ICD-10-CM

## 2013-09-03 DIAGNOSIS — Z981 Arthrodesis status: Secondary | ICD-10-CM | POA: Insufficient documentation

## 2013-09-03 MED ORDER — HYDROCODONE-ACETAMINOPHEN 10-325 MG PO TABS: 1.0000 | ORAL_TABLET | Freq: Three times a day (TID) | ORAL | Status: DC | PRN

## 2013-09-03 MED ORDER — MORPHINE SULFATE ER 30 MG PO TBCR: 30.0000 mg | EXTENDED_RELEASE_TABLET | Freq: Every day | ORAL | Status: DC

## 2013-09-03 NOTE — Progress Notes (Signed)
Subjective:    Patient ID: Hannah Scott, female    DOB: 1950/09/29, 63 y.o.   MRN: [id number]  HPI Has appointment with orthopedics to discuss right hip replacement. Continues to have right hip pain as well as low back pain. Sometimes takes a half of hydrocodone in between. We discussed that she may take Tylenol one or 2 tablets in between as needed. No other new medical problems since last visit in February. Sleep is slightly improved on trazodone Pain Inventory Average Pain 8 Pain Right Now 7 My pain is constant, burning, tingling and aching  In the last 24 hours, has pain interfered with the following? General activity 10 Relation with others 10 Enjoyment of life 10 What TIME of day is your pain at its worst? daytime evening and night Sleep (in general) Fair  Pain is worse with: walking and standing Pain improves with: rest and medication Relief from Meds: 6  Mobility use a cane ability to climb steps?  yes do you drive?  yes  Function disabled: date disabled 2008 I need assistance with the following:  household duties and shopping  Neuro/Psych weakness numbness depression  Prior Studies Any changes since last visit?  no  Physicians involved in your care Any changes since last visit?  no   Family History  Problem Relation Age of Onset  . Heart attack Father   . Heart disease Father   . Heart failure Mother   . Pneumonia Mother   . Hypertension Mother   . Heart disease Mother   . Stroke Maternal Grandfather   . Hypertension Maternal Grandfather   . Asthma Paternal Uncle     PAT UNCLES  . Heart attack Paternal Uncle    History   Social History  . Marital Status: Married    Spouse Name: N/A    Number of Children: N/A  . Years of Education: N/A   Social History Main Topics  . Smoking status: Never Smoker   . Smokeless tobacco: Never Used  . Alcohol Use: No  . Drug Use: No  . Sexual Activity: No   Other Topics Concern  . None   Social  History Narrative  . None   Past Surgical History  Procedure Laterality Date  . Cataract extraction    . Cardiovascular stress test  09/07/05    Nuclear, was negative  . Breast lumpectomy  2003    radiation on right  . Appendectomy  1987    AT [facility]  . Total abdominal hysterectomy  1987    LSO, APPENDECTOMY  . Pelvic laparoscopy  1989    RSO, LYSIS OF ADHESIONS  . Oophorectomy      LSO -RSO  . Back surgery      Fusion  . Ankle surgery     Past Medical History  Diagnosis Date  . Asthma     PFTs, February, 2011, moderate obstructive disease with response to bronchodilators, normal lung volumes, moderate reduction in diffusing capacity  . Obstructive airway disease   . Hyperlipidemia   . Hypertension   . Hypothyroidism     Patient has had in the past that she does not need treatment  . CAD (coronary artery disease)     90% distal LAD in the past  /   nuclear, 2008, no ischemia, ejection fraction 70%  . Atrial septal aneurysm     Echo, 2008  . Pinched nerve     lower back  . Kyphoscoliosis   . Shortness of  breath   . Endometriosis 1989    RIGHT TUBE  . Endometriosis 1987    LEFT TUBE/OVARY W FOCAL IN-SITU ENDOMETRIAL ADENOCARCINOMA  . Cancer 2002    DUCTAL CIS--S/P LUMPECTOMY, RADIATION AND 6 WEEKS OF TAMOXIFEN  . Ejection fraction     EF 60%, echo, October, 2008  . Elevated CPK     January, 2014  . D-dimer, elevated     January, 2014   BP 150/74  Pulse 88  Resp 14  Wt 171 lb (77.565 kg)  SpO2 95%  Opioid Risk Score:   Fall Risk Score: Moderate Fall Risk (6-13 points) (educated and handout given at previous visit for fall prevention in the home)  Review of Systems  Neurological: Positive for weakness and numbness.  Psychiatric/Behavioral: Positive for dysphoric mood.  All other systems reviewed and are negative.      Objective:   Physical Exam Sensation normal in the L3 and L4 dermatomes Hip flexor strength 3 minus on the right side, 5/5 on left  side Knee extensor strength 5/5 bilateral Ankle dorsiflexor strength 5/5 bilateral Deep tendon reflexes 2+ bilateral knees, 1+ bilateral Achilles Hip range of motion absent internal rotation on the right side Normal internal rotation on the left side Normal external rotation bilateral hips Ambulates in forward flexed posture with decreased movement on the right side, leg length discrepancy for touching with right foot       Assessment & Plan:  #1. Lumbar postlaminectomy syndrome status post lumbar fusion. She does have some intermittent L3 radicular symptoms on the right side with some sensory abnormalities but no persistent neurologic abnormalities Unable to do epidural steroid injections secondary to being unable to lay in a prone position  Continue morphine 30 mg at night Continue hydrocodone 10 mg 4 times per day Overall goal is to reduce medications however may have to go up postoperatively Orthopedic surgery to manage postop pain medications for about 1-2 months  2. Right hip osteoarthritis with contracture. Will see orthopedic surgery next week. Hopefully will have improved mobility if she has total hip replacement and this may in turn help with low back issues as well.

## 2013-09-03 NOTE — Patient Instructions (Signed)
If patient undergoes total hip replacement then orthopedic surgery will prescribed pain medicine for the first month or 2 postoperatively  Please notify our office once surgery is scheduled

## 2013-09-20 ENCOUNTER — Other Ambulatory Visit: Payer: Self-pay | Admitting: Internal Medicine

## 2013-10-01 ENCOUNTER — Other Ambulatory Visit: Payer: Self-pay

## 2013-10-01 DIAGNOSIS — F3289 Other specified depressive episodes: Secondary | ICD-10-CM

## 2013-10-01 DIAGNOSIS — F329 Major depressive disorder, single episode, unspecified: Secondary | ICD-10-CM

## 2013-10-01 MED ORDER — SERTRALINE HCL 50 MG PO TABS: ORAL_TABLET | ORAL | Status: DC

## 2013-10-01 NOTE — Telephone Encounter (Signed)
90

## 2013-10-02 ENCOUNTER — Ambulatory Visit (INDEPENDENT_AMBULATORY_CARE_PROVIDER_SITE_OTHER): Payer: Medicare Other | Admitting: Endocrinology

## 2013-10-02 ENCOUNTER — Encounter: Payer: Self-pay | Admitting: Endocrinology

## 2013-10-02 VITALS — BP 134/90 | HR 78 | Temp 98.3°F | Ht 60.0 in | Wt 171.0 lb

## 2013-10-02 DIAGNOSIS — E890 Postprocedural hypothyroidism: Secondary | ICD-10-CM

## 2013-10-02 LAB — TSH: TSH: 15.91 u[IU]/mL — ABNORMAL HIGH (ref 0.35–4.50)

## 2013-10-02 MED ORDER — LEVOTHYROXINE SODIUM 75 MCG PO TABS: 75.0000 ug | ORAL_TABLET | Freq: Every day | ORAL | Status: DC

## 2013-10-02 NOTE — Patient Instructions (Signed)
A thyroid blood test is being requested for you today.  We'll contact you with results. Please come back for a follow-up appointment in 3 months.    Most of the time, your thyroid will start working more or less than it is now.  This means that your need for the levothyroxine will go up or down with time, bult seldom stays the same.

## 2013-10-02 NOTE — Progress Notes (Signed)
Subjective:    Patient ID: Hannah Scott, female    DOB: Jun 06, 1950, 63 y.o.   MRN: [id number]  HPI Pt returns for f/u of post-I-131 hypothyroidism (she had i-131 rx for hyperthyroidism, due to multinodular goiter, in September, 2013; in early 2014, TSH was high, and synthroid was started; she continues to have depression, due to the recent death of her husband.  In 2013/07/23, synthroid was reduced to 50 mcg/day.   Past Medical History  Diagnosis Date  . Asthma     PFTs, February, 2011, moderate obstructive disease with response to bronchodilators, normal lung volumes, moderate reduction in diffusing capacity  . Obstructive airway disease   . Hyperlipidemia   . Hypertension   . Hypothyroidism     Patient has had in the past that she does not need treatment  . CAD (coronary artery disease)     90% distal LAD in the past  /   nuclear, 2008, no ischemia, ejection fraction 70%  . Atrial septal aneurysm     Echo, 2008  . Pinched nerve     lower back  . Kyphoscoliosis   . Shortness of breath   . Endometriosis 1989    RIGHT TUBE  . Endometriosis 1987    LEFT TUBE/OVARY W FOCAL IN-SITU ENDOMETRIAL ADENOCARCINOMA  . Cancer 2002    DUCTAL CIS--S/P LUMPECTOMY, RADIATION AND 6 WEEKS OF TAMOXIFEN  . Ejection fraction     EF 60%, echo, October, 2008  . Elevated CPK     January, 2014  . D-dimer, elevated     January, 2014    Past Surgical History  Procedure Laterality Date  . Cataract extraction    . Cardiovascular stress test  09/07/05    Nuclear, was negative  . Breast lumpectomy  2003    radiation on right  . Appendectomy  1987    AT [facility]  . Total abdominal hysterectomy  1987    LSO, APPENDECTOMY  . Pelvic laparoscopy  1989    RSO, LYSIS OF ADHESIONS  . Oophorectomy      LSO -RSO  . Back surgery      Fusion  . Ankle surgery      History   Social History  . Marital Status: Married    Spouse Name: N/A    Number of Children: N/A  . Years of Education: N/A    Occupational History  . Not on file.   Social History Main Topics  . Smoking status: Never Smoker   . Smokeless tobacco: Never Used  . Alcohol Use: No  . Drug Use: No  . Sexual Activity: No   Other Topics Concern  . Not on file   Social History Narrative  . No narrative on file    Current Outpatient Prescriptions on File Prior to Visit  Medication Sig Dispense Refill  . albuterol (PROAIR HFA) 108 (90 BASE) MCG/ACT inhaler Inhale 1-2 puffs into the lungs every 6 (six) hours as needed.  1 Inhaler  6  . amLODipine (NORVASC) 5 MG tablet TAKE ONE TABLET BY MOUTH EVERY DAY  30 tablet  11  . aspirin 81 MG tablet Take 81 mg by mouth daily.        [name] atorvastatin (LIPITOR) 40 MG tablet Take 1 tablet (40 mg total) by mouth at bedtime.  90 tablet  2  . benazepril (LOTENSIN) 40 MG tablet TAKE ONE-HALF TABLET BY MOUTH ONCE DAILY.  45 tablet  1  . HYDROcodone-acetaminophen (NORCO) 10-325 MG  per tablet Take 1 tablet by mouth every 8 (eight) hours as needed.  90 tablet  0  . ipratropium-albuterol (DUONEB) 0.5-2.5 (3) MG/3ML SOLN Take 3 mLs by nebulization every 4 (four) hours as needed. Must not be qid unless during exacerbation. Dx 493.90  360 mL  0  . metoprolol tartrate (LOPRESSOR) 25 MG tablet TAKE ONE TABLET BY MOUTH TWICE DAILY  180 tablet  1  . mometasone-formoterol (DULERA) 200-5 MCG/ACT AERO Inhale 2 puffs into the lungs 2 (two) times daily.  8.8 g  5  . morphine (MS CONTIN) 30 MG 12 hr tablet Take 1 tablet (30 mg total) by mouth at bedtime.  30 tablet  0  . sertraline (ZOLOFT) 50 MG tablet TAKE ONE TABLET BY MOUTH ONCE DAILY.  90 tablet  0  . traZODone (DESYREL) 50 MG tablet Take 1 tablet (50 mg total) by mouth at bedtime.  30 tablet  2   No current facility-administered medications on file prior to visit.    Allergies  Allergen Reactions  . Fluticasone-Salmeterol     REACTION: headache.  She is tolerating Dulera well.    Family History  Problem Relation Age of Onset  . Heart  attack Father   . Heart disease Father   . Heart failure Mother   . Pneumonia Mother   . Hypertension Mother   . Heart disease Mother   . Stroke Maternal Grandfather   . Hypertension Maternal Grandfather   . Asthma Paternal Uncle     PAT UNCLES  . Heart attack Paternal Uncle     BP 134/90  Pulse 78  Temp(Src) 98.3 F (36.8 C) (Oral)  Ht 5' (1.524 m)  Wt 171 lb (77.565 kg)  BMI 33.40 kg/m2  SpO2 93%  Review of Systems She also reports fatigue and tremor.     Objective:   Physical Exam VITAL SIGNS:  See vs page GENERAL: no distress Skin: not diaphoretic.  Skin is dry.   Neuro: moderate tremor of the hands.   Lab Results  Component Value Date   TSH 15.91* 10/02/2013      Assessment & Plan:  Hypothyroidism: mild exacerbation Depression: this complicates the rx of thyroid sxs.  I'll work around this as best I can. Tremor: not thyroid-related.    Patient is advised the following: Patient Instructions  A thyroid blood test is being requested for you today.  We'll contact you with results. Please come back for a follow-up appointment in 3 months.    Most of the time, your thyroid will start working more or less than it is now.  This means that your need for the levothyroxine will go up or down with time, bult seldom stays the same.

## 2013-10-07 ENCOUNTER — Encounter: Payer: Self-pay | Admitting: Registered Nurse

## 2013-10-07 ENCOUNTER — Encounter: Payer: Medicare Other | Attending: Physical Medicine & Rehabilitation | Admitting: Registered Nurse

## 2013-10-07 VITALS — BP 140/54 | HR 78 | Resp 14 | Wt 170.8 lb

## 2013-10-07 DIAGNOSIS — M48062 Spinal stenosis, lumbar region with neurogenic claudication: Secondary | ICD-10-CM

## 2013-10-07 DIAGNOSIS — M24559 Contracture, unspecified hip: Secondary | ICD-10-CM | POA: Insufficient documentation

## 2013-10-07 DIAGNOSIS — M169 Osteoarthritis of hip, unspecified: Secondary | ICD-10-CM

## 2013-10-07 DIAGNOSIS — Z5181 Encounter for therapeutic drug level monitoring: Secondary | ICD-10-CM

## 2013-10-07 DIAGNOSIS — M961 Postlaminectomy syndrome, not elsewhere classified: Secondary | ICD-10-CM

## 2013-10-07 DIAGNOSIS — M1611 Unilateral primary osteoarthritis, right hip: Secondary | ICD-10-CM

## 2013-10-07 DIAGNOSIS — M1610 Unilateral primary osteoarthritis, unspecified hip: Secondary | ICD-10-CM | POA: Insufficient documentation

## 2013-10-07 DIAGNOSIS — Z79899 Other long term (current) drug therapy: Secondary | ICD-10-CM

## 2013-10-07 DIAGNOSIS — M4120 Other idiopathic scoliosis, site unspecified: Secondary | ICD-10-CM | POA: Insufficient documentation

## 2013-10-07 MED ORDER — HYDROCODONE-ACETAMINOPHEN 10-325 MG PO TABS: 1.0000 | ORAL_TABLET | Freq: Three times a day (TID) | ORAL | Status: DC | PRN

## 2013-10-07 MED ORDER — MORPHINE SULFATE ER 30 MG PO TBCR: 30.0000 mg | EXTENDED_RELEASE_TABLET | Freq: Every day | ORAL | Status: DC

## 2013-10-07 NOTE — Progress Notes (Signed)
Subjective:    Patient ID: Hannah Scott, female    DOB: 1950/05/27, 63 y.o.   MRN: [id number]  HPI: [name] is a 63 year old femal who returns for follow up for chronic pain and medication refill. She says her pain is located in her lower back. She arrived to clinic today using her walker ([place]). She rates her pain 7. She says she hasn't been feeling well lately her throid level is high and she has been extremely exhausted. Her PMD is following and her synthroid has been adjusted. Her current exercise regime is some walking. She is in need of Right Hip replacement she will decide on date as soon as she has her family support in place. Dr. Lorin Mercy will be performing surgery [place].   Pain Inventory Average Pain 8 Pain Right Now 7 My pain is constant, burning, tingling and aching  In the last 24 hours, has pain interfered with the following? General activity 10 Relation with others 10 Enjoyment of life 10 What TIME of day is your pain at its worst? all Sleep (in general) Fair  Pain is worse with: walking and standing Pain improves with: rest and medication Relief from Meds: 4  Mobility use a cane use a walker ability to climb steps?  yes do you drive?  yes  Function disabled: date disabled . I need assistance with the following:  household duties and shopping  Neuro/Psych numbness tingling trouble walking depression  Prior Studies Any changes since last visit?  no  Physicians involved in your care Any changes since last visit?  no   Family History  Problem Relation Age of Onset  . Heart attack Father   . Heart disease Father   . Heart failure Mother   . Pneumonia Mother   . Hypertension Mother   . Heart disease Mother   . Stroke Maternal Grandfather   . Hypertension Maternal Grandfather   . Asthma Paternal Uncle     PAT UNCLES  . Heart attack Paternal Uncle    History   Social History  . Marital Status: Married    Spouse Name: N/A      Number of Children: N/A  . Years of Education: N/A   Social History Main Topics  . Smoking status: Never Smoker   . Smokeless tobacco: Never Used  . Alcohol Use: No  . Drug Use: No  . Sexual Activity: No   Other Topics Concern  . None   Social History Narrative  . None   Past Surgical History  Procedure Laterality Date  . Cataract extraction    . Cardiovascular stress test  09/07/05    Nuclear, was negative  . Breast lumpectomy  2003    radiation on right  . Appendectomy  1987    AT [facility]  . Total abdominal hysterectomy  1987    LSO, APPENDECTOMY  . Pelvic laparoscopy  1989    RSO, LYSIS OF ADHESIONS  . Oophorectomy      LSO -RSO  . Back surgery      Fusion  . Ankle surgery     Past Medical History  Diagnosis Date  . Asthma     PFTs, February, 2011, moderate obstructive disease with response to bronchodilators, normal lung volumes, moderate reduction in diffusing capacity  . Obstructive airway disease   . Hyperlipidemia   . Hypertension   . Hypothyroidism     Patient has had in the past that she does not need  treatment  . CAD (coronary artery disease)     90% distal LAD in the past  /   nuclear, 2008, no ischemia, ejection fraction 70%  . Atrial septal aneurysm     Echo, 2008  . Pinched nerve     lower back  . Kyphoscoliosis   . Shortness of breath   . Endometriosis 1989    RIGHT TUBE  . Endometriosis 1987    LEFT TUBE/OVARY W FOCAL IN-SITU ENDOMETRIAL ADENOCARCINOMA  . Cancer 2002    DUCTAL CIS--S/P LUMPECTOMY, RADIATION AND 6 WEEKS OF TAMOXIFEN  . Ejection fraction     EF 60%, echo, October, 2008  . Elevated CPK     January, 2014  . D-dimer, elevated     January, 2014   BP 140/54  Pulse 78  Resp 14  Wt 170 lb 12.8 oz (77.474 kg)  SpO2 93%  Opioid Risk Score:   Fall Risk Score: Moderate Fall Risk (6-13 points) (previously educated and handout given for fall prevention in the home)  Review of Systems  Musculoskeletal: Positive for gait  problem.  Neurological: Positive for numbness.       Tingling  Psychiatric/Behavioral: Positive for dysphoric mood.  All other systems reviewed and are negative.      Objective:   Physical Exam  Nursing note and vitals reviewed. Constitutional: She is oriented to person, place, and time. She appears well-developed and well-nourished.  HENT:  Head: Normocephalic and atraumatic.  Neck: Normal range of motion. Neck supple.  Cardiovascular: Normal rate, regular rhythm and normal heart sounds.   Pulmonary/Chest: Effort normal and breath sounds normal.  Musculoskeletal:  Normal Muscle Bulk and Muscle testing Reveals: Upper Extremities: Full ROM and Muscle Strength 5/5. Thoracic Kyphosis/ Ambulates in a forward flexion. Spine Forward Flexion 30 degrees Lumbar Paraspinal Tenderness Noted: L-3- L-5 Lower Extremities: Right Leg Flexion Produces pain into Lumbar, Decreased ROM. Left Leg: Full ROM and Muscle Strength 5/5 Arises From Chair with Ease Antalgic Gait  Neurological: She is alert and oriented to person, place, and time.  Skin: Skin is warm and dry.  Psychiatric: She has a normal mood and affect.          Assessment & Plan:  1 Lumbar post laminectomy syndrome, s/p lumbar fusion:  Refilled: Hydrocodone 10/325mg  one tablet every 8 hours #90, MS CONTIN 30 mg HS #30  2. Right Hip OA: Surgery Pending. She has to schedule the date when family support is in line. 3.Depression: On Zoloft. Has Family Support and Friends.   20 minutes of face to face patient care time was spent during this visit. All questions were encouraged and answered.

## 2013-10-09 ENCOUNTER — Ambulatory Visit: Payer: Medicare Other | Admitting: Internal Medicine

## 2013-10-14 ENCOUNTER — Encounter: Payer: Self-pay | Admitting: Internal Medicine

## 2013-10-14 ENCOUNTER — Ambulatory Visit (INDEPENDENT_AMBULATORY_CARE_PROVIDER_SITE_OTHER): Payer: Medicare Other | Admitting: Internal Medicine

## 2013-10-14 VITALS — BP 130/86 | HR 86 | Temp 98.3°F | Wt 171.4 lb

## 2013-10-14 DIAGNOSIS — E039 Hypothyroidism, unspecified: Secondary | ICD-10-CM

## 2013-10-14 DIAGNOSIS — R609 Edema, unspecified: Secondary | ICD-10-CM

## 2013-10-14 MED ORDER — HYDROCHLOROTHIAZIDE 12.5 MG PO CAPS: 12.5000 mg | ORAL_CAPSULE | Freq: Every day | ORAL | Status: DC

## 2013-10-14 NOTE — Progress Notes (Signed)
   Subjective:    Patient ID: Hannah Scott, female    DOB: 24-Apr-1951, 63 y.o.   MRN: [id number]  HPI  She describes painless edema of her ankles for the last 2 - 2.5 weeks. It has been worse the last week. There was no specific trigger such as increased salt intake, immobilization, prolonged travel  She has chronic exertional dyspnea related to her kyphoscoliosis and reactive airways disease.  She is on amlodipine. She is not on a diuretic her creatinine was  0.6 on 07/08/13.  Thyroid dysfunction is being addressed; TSH was 15.91 on 10/02/13.    Review of Systems  She denies fever, chills, or sweats  She's had no chest pain, palpitations, or paroxysmal nocturnal dyspnea     Objective:   Physical Exam  Significant or distinguishing  findings on physical exam are documented first.  Below that are other systems examined & findings. She has profound kyphoscoliosis. It is very difficult for her to get up on the exam table and to lie supine or sit up without help. Breath sounds are decreased w/o rales, rhonchi, or wheezes. She has an S4 gallop with a grade 1 systolic flow murmur. There is one half-1+ pedal edema.   Appears well-nourished & in no acute distress  No carotid bruits are present.No neck pain distention present at 10 - 15 degrees. Thyroid normal to palpation  There is no evidence of aortic aneurysm or renal artery bruits  Abdomen soft with no organomegaly or masses. No HJR  No clubbing or cyanosis  present.  Pedal pulses are intact   No ischemic skin changes are present . Fingernails healthy   Alert and oriented. Strength decreased generally          Assessment & Plan:  #1 edema  #2 hypothyroidism  Plan: Hold amlodipine. Initiate HCTZ 12.5 mg daily.  If there is failure to improve then cardiopulmonary workup be pursued to rule out a component of cor pulmonale.

## 2013-10-14 NOTE — Progress Notes (Signed)
Pre visit review using our clinic review tool, if applicable. No additional management support is needed unless otherwise documented below in the visit note. 

## 2013-10-14 NOTE — Patient Instructions (Signed)
   Please stop the amlodipine and initiate HCTZ 12.5 mg daily. Continue to restrict sodium intake.  If the edema fails to improve with the low dose diuretic and correction of your hypothyroidism; additional studies would be indicated.

## 2013-10-18 ENCOUNTER — Other Ambulatory Visit (HOSPITAL_COMMUNITY): Payer: Self-pay | Admitting: Orthopaedic Surgery

## 2013-10-21 ENCOUNTER — Encounter (HOSPITAL_COMMUNITY): Payer: Self-pay

## 2013-10-23 ENCOUNTER — Encounter (HOSPITAL_COMMUNITY)
Admission: RE | Admit: 2013-10-23 | Discharge: 2013-10-23 | Disposition: A | Discharge status: Home / Self Care | Payer: Medicare Other | Source: Ambulatory Visit | Attending: Orthopaedic Surgery | Admitting: Orthopaedic Surgery

## 2013-10-23 ENCOUNTER — Encounter (HOSPITAL_COMMUNITY): Payer: Self-pay

## 2013-10-23 DIAGNOSIS — Z01818 Encounter for other preprocedural examination: Secondary | ICD-10-CM | POA: Insufficient documentation

## 2013-10-23 DIAGNOSIS — Z01812 Encounter for preprocedural laboratory examination: Secondary | ICD-10-CM | POA: Insufficient documentation

## 2013-10-23 HISTORY — DX: Anemia, unspecified: D64.9

## 2013-10-23 HISTORY — DX: Major depressive disorder, single episode, unspecified: F32.9

## 2013-10-23 HISTORY — DX: Unspecified osteoarthritis, unspecified site: M19.90

## 2013-10-23 HISTORY — DX: Gastro-esophageal reflux disease without esophagitis: K21.9

## 2013-10-23 HISTORY — DX: Urinary tract infection, site not specified: N39.0

## 2013-10-23 HISTORY — DX: Depression, unspecified: F32.A

## 2013-10-23 HISTORY — DX: Personal history of other diseases of the digestive system: Z87.19

## 2013-10-23 LAB — COMPREHENSIVE METABOLIC PANEL
ALT: 16 U/L (ref 0–35)
AST: 22 U/L (ref 0–37)
Albumin: 3.5 g/dL (ref 3.5–5.2)
Alkaline Phosphatase: 163 U/L — ABNORMAL HIGH (ref 39–117)
Anion gap: 13 (ref 5–15)
BUN: 11 mg/dL (ref 6–23)
CALCIUM: 9.1 mg/dL (ref 8.4–10.5)
CO2: 26 meq/L (ref 19–32)
CREATININE: 0.6 mg/dL (ref 0.50–1.10)
Chloride: 100 mEq/L (ref 96–112)
GFR calc non Af Amer: >90 mL/min (ref >90)
GLUCOSE: 99 mg/dL (ref 70–99)
Potassium: 4.5 mEq/L (ref 3.7–5.3)
Sodium: 139 mEq/L (ref 137–147)
Total Bilirubin: 0.2 mg/dL — ABNORMAL LOW (ref 0.3–1.2)
Total Protein: 6.6 g/dL (ref 6.0–8.3)

## 2013-10-23 LAB — URINALYSIS, ROUTINE W REFLEX MICROSCOPIC
Bilirubin Urine: NEGATIVE
Glucose, UA: NEGATIVE mg/dL
Hgb urine dipstick: NEGATIVE
KETONES UR: NEGATIVE mg/dL
Nitrite: NEGATIVE
Protein, ur: NEGATIVE mg/dL
Specific Gravity, Urine: 1.016 (ref 1.005–1.030)
Urobilinogen, UA: 1 mg/dL (ref 0.0–1.0)
pH: 6.5 (ref 5.0–8.0)

## 2013-10-23 LAB — CBC
HCT: 36.7 % (ref 36.0–46.0)
Hemoglobin: 11.6 g/dL — ABNORMAL LOW (ref 12.0–15.0)
MCH: 29.6 pg (ref 26.0–34.0)
MCHC: 31.6 g/dL (ref 30.0–36.0)
MCV: 93.6 fL (ref 78.0–100.0)
PLATELETS: 353 10*3/uL (ref 150–400)
RBC: 3.92 MIL/uL (ref 3.87–5.11)
RDW: 12.9 % (ref 11.5–15.5)
WBC: 6 10*3/uL (ref 4.0–10.5)

## 2013-10-23 LAB — TYPE AND SCREEN
ABO/RH(D): O POS
Antibody Screen: NEGATIVE

## 2013-10-23 LAB — SURGICAL PCR SCREEN
MRSA, PCR: NEGATIVE
Staphylococcus aureus: NEGATIVE

## 2013-10-23 LAB — PROTIME-INR
INR: 0.98 (ref 0.00–1.49)
PROTHROMBIN TIME: 13 s (ref 11.6–15.2)

## 2013-10-23 LAB — URINE MICROSCOPIC-ADD ON

## 2013-10-23 LAB — ABO/RH: ABO/RH(D): O POS

## 2013-10-23 LAB — APTT: aPTT: 28 seconds (ref 24–37)

## 2013-10-23 NOTE — Pre-Procedure Instructions (Addendum)
Hannah Scott  10/23/2013   Your procedure is scheduled on: 10/28/13   Report to [facility] short stay admitting at 72 AM.  Call this number if you have problems the morning of surgery: [phone]   Remember:   Do not eat food or drink liquids after midnight.   Take these medicines the morning of surgery with A SIP OF WATER: PAIN MED, INHALERS,NEBULIZER IF NEEDED(BRING INHALER WITH YOU),LEVOTHYROXINE,METOPROLOL     Take all meds as ordered until day of surgery except as instructed below or per dr    Hannah Scott all herbel meds, nsaids (aleve,naproxen,advil,ibuprofen) starting now including aspirin, vitamins   Do not wear jewelry, make-up or nail polish.  Do not wear lotions, powders, or perfumes. You may wear deodorant.  Do not shave 48 hours prior to surgery. Men may shave face and neck.  Do not bring valuables to the hospital.  [facility] is not responsible                  for any belongings or valuables.               Contacts, dentures or bridgework may not be worn into surgery.  Leave suitcase in the car. After surgery it may be brought to your room.  For patients admitted to the hospital, discharge time is determined by your                treatment team.               Patients discharged the day of surgery will not be allowed to drive  home.  Name and phone number of your driver:   Special Instructions:  Special Instructions: [place] - Preparing for Surgery  Before surgery, you can play an important role.  Because skin is not sterile, your skin needs to be as free of germs as possible.  You can reduce the number of germs on you skin by washing with CHG (chlorahexidine gluconate) soap before surgery.  CHG is an antiseptic cleaner which kills germs and bonds with the skin to continue killing germs even after washing.  Please DO NOT use if you have an allergy to CHG or antibacterial soaps.  If your skin becomes reddened/irritated stop using the CHG and inform your nurse when you  arrive at Short Stay.  Do not shave (including legs and underarms) for at least 48 hours prior to the first CHG shower.  You may shave your face.  Please follow these instructions carefully:   1.  Shower with CHG Soap the night before surgery and the morning of Surgery.  2.  If you choose to wash your hair, wash your hair first as usual with your normal shampoo.  3.  After you shampoo, rinse your hair and body thoroughly to remove the Shampoo.  4.  Use CHG as you would any other liquid soap.  You can apply chg directly  to the skin and wash gently with scrungie or a clean washcloth.  5.  Apply the CHG Soap to your body ONLY FROM THE NECK DOWN.  Do not use on open wounds or open sores.  Avoid contact with your eyes ears, mouth and genitals (private parts).  Wash genitals (private parts)       with your normal soap.  6.  Wash thoroughly, paying special attention to the area where your surgery will be performed.  7.  Thoroughly rinse your body with warm water from the neck down.  8.  DO NOT shower/wash with your normal soap after using and rinsing off the CHG Soap.  9.  Pat yourself dry with a clean towel.            10.  Wear clean pajamas.            11.  Place clean sheets on your bed the night of your first shower and do not sleep with pets.  Day of Surgery  Do not apply any lotions/deodorants the morning of surgery.  Please wear clean clothes to the hospital/surgery center.   Please read over the following fact sheets that you were given: Pain Booklet, Coughing and Deep Breathing, Blood Transfusion Information, Total Joint Packet, MRSA Information and Surgical Site Infection Prevention

## 2013-10-23 NOTE — H&P (Signed)
TOTAL HIP ADMISSION H&P  Patient is admitted for right total hip arthroplasty.  Subjective:  Chief Complaint: right hip pain  HPI: Hannah Scott, 63 y.o. female, has a history of pain and functional disability in the right hip(s) due to arthritis and patient has failed non-surgical conservative treatments for greater than 12 weeks to include NSAID's and/or analgesics, use of assistive devices and activity modification.  Onset of symptoms was gradual starting 3 years ago with gradually worsening course since that time.The patient noted no past surgery on the right hip(s).  Patient currently rates pain in the right hip at 8 out of 10 with activity. Patient has night pain, worsening of pain with activity and weight bearing, trendelenberg gait and 30 degree flexion contracture of the right hip. Patient has evidence of subchondral cysts, subchondral sclerosis, periarticular osteophytes and joint space narrowing by imaging studies. This condition presents safety issues increasing the risk of falls. There is no current active infection.  Patient Active Problem List   Diagnosis Date Noted  . Other postablative hypothyroidism 07/02/2013  . Elevated CPK   . D-dimer, elevated   . Multinodular goiter 12/01/2011  . Asthma   . Hypothyroidism   . CAD (coronary artery disease)   . Ejection fraction   . Atrial septal aneurysm   . Hyperlipidemia   . Hypertension   . Shortness of breath   . Scoliosis   . Spinal stenosis, lumbar region, with neurogenic claudication 07/01/2011  . Lumbar postlaminectomy syndrome 07/01/2011  . Osteoarthritis of right hip 07/01/2011  . Contracture of right hip 07/01/2011  . Endometriosis   . DEPRESSION 12/24/2009  . FATIGUE 12/24/2009  . ESOPHAGEAL REFLUX 04/09/2007  . MIGRAINES, HX OF 05/19/2006   Past Medical History  Diagnosis Date  . Asthma     PFTs, February, 2011, moderate obstructive disease with response to bronchodilators, normal lung volumes, moderate reduction  in diffusing capacity  . Obstructive airway disease   . Hyperlipidemia   . Hypertension   . Hypothyroidism     Patient has had in the past that she does not need treatment  . CAD (coronary artery disease)     90% distal LAD in the past  /   nuclear, 2008, no ischemia, ejection fraction 70%  . Atrial septal aneurysm     Echo, 2008-not noted on 13 echo  . Pinched nerve     lower back  . Kyphoscoliosis   . Shortness of breath   . Endometriosis 1989    RIGHT TUBE  . Endometriosis 1987    LEFT TUBE/OVARY W FOCAL IN-SITU ENDOMETRIAL ADENOCARCINOMA  . Cancer 2002    DUCTAL CIS--S/P LUMPECTOMY, RADIATION AND 6 WEEKS OF TAMOXIFEN  . Ejection fraction     EF 60%, echo, October, 2008  . Elevated CPK     January, 2014  . D-dimer, elevated     January, 2014  . Depression   . UTI (lower urinary tract infection)   . GERD (gastroesophageal reflux disease)     occ  . H/O hiatal hernia     ?  [name] Arthritis   . Anemia     hx    Past Surgical History  Procedure Laterality Date  . Cataract extraction Bilateral 01,03  . Cardiovascular stress test  09/07/05    Nuclear, was negative  . Breast lumpectomy  2003    radiation on right  . Appendectomy  1987    AT [facility]  . Total abdominal hysterectomy  1987  LSO, APPENDECTOMY  . Pelvic laparoscopy  1989    RSO, LYSIS OF ADHESIONS  . Oophorectomy      LSO -RSO  . Back surgery      Fusion  . Ankle surgery Left     ligament    No prescriptions prior to admission   Allergies  Allergen Reactions  . Tape     Prefers paper tape  . Fluticasone-Salmeterol     REACTION: headache.  She is tolerating Dulera well.    History  Substance Use Topics  . Smoking status: Never Smoker   . Smokeless tobacco: Never Used  . Alcohol Use: No    Family History  Problem Relation Age of Onset  . Heart attack Father   . Heart disease Father   . Heart failure Mother   . Pneumonia Mother   . Hypertension Mother   . Heart disease Mother   . Stroke  Maternal Grandfather   . Hypertension Maternal Grandfather   . Asthma Paternal Uncle     PAT UNCLES  . Heart attack Paternal Uncle      Review of Systems  Musculoskeletal: Positive for joint pain.       Right hip paiin  All other systems reviewed and are negative.   Objective:  Physical Exam  Constitutional: She is oriented to person, place, and time. She appears well-developed and well-nourished.  HENT:  Head: Normocephalic and atraumatic.  Eyes: EOM are normal. Pupils are equal, round, and reactive to light.  Neck: Normal range of motion.  Cardiovascular: Normal rate.   Respiratory: Effort normal.  GI: Soft.  Musculoskeletal:  She is ambulatory with a cane.  She cannot get up to an upright position.  Lower extremity reflexes 2+.  She has 15 degrees valgus of her left knee and 20 degrees varus of her right knee.  There is 30 degree hip flexion contracture, right hip, none on the left, severe pain with only 10 degrees of internal rotation of her right hip.    Neurological: She is alert and oriented to person, place, and time.  Skin: Skin is warm and dry.    Vital signs in last 24 hours: Temp:  [98.6 F (37 C)] 98.6 F (37 C) (07/01 1504) Pulse Rate:  [77] 77 (07/01 1504) Resp:  [18] 18 (07/01 1504) BP: (106)/(70) 106/70 mmHg (07/01 1504) SpO2:  [92 %] 92 % (07/01 1504) Weight:  [76.885 kg (169 lb 8 oz)] 76.885 kg (169 lb 8 oz) (07/01 1504)  Labs:   Estimated body mass index is 33.47 kg/(m^2) as calculated from the following:   Height as of 10/02/13: 5' (1.524 m).   Weight as of 10/14/13: 77.747 kg (171 lb 6.4 oz).   Imaging Review Plain radiographs demonstrate severe degenerative joint disease of the right hip(s). The bone quality appears to be adequate for age and reported activity level.  Assessment/Plan:  End stage arthritis, right hip(s)  The patient history, physical examination, clinical judgement of the provider and imaging studies are consistent with end  stage degenerative joint disease of the right hip(s) and total hip arthroplasty is deemed medically necessary. The treatment options including medical management, injection therapy, arthroscopy and arthroplasty were discussed at length. The risks and benefits of total hip arthroplasty were presented and reviewed. The risks due to aseptic loosening, infection, stiffness, dislocation/subluxation,  thromboembolic complications and other imponderables were discussed.  The patient acknowledged the explanation, agreed to proceed with the plan and consent was signed. Patient is being admitted  for inpatient treatment for surgery, pain control, PT, OT, prophylactic antibiotics, VTE prophylaxis, progressive ambulation and ADL's and discharge planning.The patient is planning to be discharged home with home health services

## 2013-10-24 NOTE — Progress Notes (Signed)
Spoke with patient and informed her of time change--she will arrive at 9:30am

## 2013-10-24 NOTE — Progress Notes (Addendum)
Anesthesia Chart Review:  Hannah Scott is a 63 year old female scheduled for right THA on 10/28/13 by Dr. Lorin Mercy.  History includes non-smoker, CAD ("90% distal LAD in the past" 2007 or sooner), atrial septal aneurysm on 2008 echo, GERD, question of hiatal hernia, anemia, COPD, asthma, arthritis, kyphoscoliosis, HLD, HTN, right breast cancer s/p lumpectomy and radiation '03, hyperthyroidism due to multinodular goiter s/p i-131 therapy 2013/2014 now with secondary hypothyroidism, appendectomy, hysterectomy, back surgery.  PCP is Dr. Mercy Moore. Endocrinologist is Dr. Renato Shin.  Cardiologist is Dr. Ron Parker.   Nuclear stress test on 03/28/13 showed: Overall Impression: Intermediate risk stress nuclear study with a small area moderate severity reversible defect in PLVB territory. This read might be affected by a poor quality study with extracardiac attenuation. LV Ejection Fraction: 58%. LV Wall Motion: NL LV Function; NL Wall Motion. (Hannah Scott has known distal LAD disease. She gets headaches with nitrates, so Dr. Ron Parker chose to increase her amlodipine and follow her clinically.)  Echo on 08/31/11 showed: - Left ventricle: The cavity size was normal. Wall thickness was increased in a pattern of mild LVH. Systolic function was normal. The estimated ejection fraction was 55%. Wall motion was normal; there were no regional wall motion abnormalities. - Left atrium: The atrium was mildly dilated. - Atrial septum: No defect or patent foramen ovale was identified. - Pulmonic valve: Trivial regurgitation. - Tricuspid valve: Mild regurgitation. - Pulmonary arteries: PA peak pressure: 49mm Hg (S).  Her last cath was at least 8 years or more ago.  There is no report in Epic or E-chart.  EKG on 03/08/13 showed: NSR, low voltage QRS, cannot rule out anterior infarct (age undetermined).  CXR on 10/23/13 showed: Bibasilar atelectasis, with some improvement on the left.  Hiatal hernia.  Preoperative labs noted.     Hannah Scott with known CAD involving the distal LAD by records.  Her last stress test was intermediate risk but poor quality.  I called and spoke with Hannah Scott.  She denies any recurrent neck tightness (which is why she initially had the stress test last year) and denies chest pain.  Unless she is having an asthma flare, she hasn't noticed any new SOB.  She has had very limited activity for the past 2 1/2 weeks due to severe hip pain. She was seen by Dr. Linna Darner on 10/14/13 for pedal edema.  He stopped her amlodipine and started her on low dose HCTZ.  Hannah Scott reports her edema has improved. I reviewed with anesthesiologist Dr. Tobias Alexander who agrees that she will need cardiac clearance.  I have notified [place] at Dr. Lorin Mercy office.  She contacted Dr. Ron Parker who asked that I contact him personally which I did. I gave him all the information that Hannah Scott gave me including that her amlodipine is now discontinued.  Dr. Ron Parker felt that because she had not had any new/recurrent chest/neck pain or new SOB that she would not need to see him again prior to surgery and could proceed as planned in no acute changes.  I left a voice message with Sherrie with update.  George Hugh Carnegie [facility] Short Stay Center/Anesthesiology Phone [phone] 10/24/2013 11:16 AM

## 2013-10-24 NOTE — Anesthesia Preprocedure Evaluation (Addendum)
Anesthesia Evaluation  Patient identified by MRN, date of birth, ID band Patient awake    Reviewed: Allergy & Precautions, H&P , NPO status , Patient's Chart, lab work & pertinent test results  History of Anesthesia Complications Negative for: history of anesthetic complications  Airway Mallampati: II TM Distance: >3 FB     Dental  (+) Teeth Intact, Dental Advisory Given   Pulmonary shortness of breath, asthma , COPD   Pulmonary exam normal       Cardiovascular hypertension, + CAD  90% distal LAD in the past  /   nuclear, 2008, no ischemia, ejection fraction 70%   Neuro/Psych PSYCHIATRIC DISORDERS Depression negative neurological ROS     GI/Hepatic Neg liver ROS, hiatal hernia, GERD-  ,  Endo/Other  Hypothyroidism   Renal/GU negative Renal ROS     Musculoskeletal   Abdominal   Peds  Hematology   Anesthesia Other Findings Missing filling upper tooth  Reproductive/Obstetrics                         Anesthesia Physical Anesthesia Plan  ASA: III  Anesthesia Plan: General   Post-op Pain Management:    Induction:   Airway Management Planned: Oral ETT  Additional Equipment:   Intra-op Plan:   Post-operative Plan: Extubation in OR  Informed Consent: I have reviewed the patients History and Physical, chart, labs and discussed the procedure including the risks, benefits and alternatives for the proposed anesthesia with the patient or authorized representative who has indicated his/her understanding and acceptance.   Dental advisory given and Dental Advisory Given  Plan Discussed with: CRNA, Anesthesiologist and Surgeon  Anesthesia Plan Comments: (See my anesthesia note.  Dr. Ron Parker felt patient could proceed with surgery. Myra Gianotti, PA-C)      Anesthesia Quick Evaluation

## 2013-10-27 MED ORDER — CEFAZOLIN SODIUM-DEXTROSE 2-3 GM-% IV SOLR
2.0000 g | INTRAVENOUS | Status: AC
  Administered 2013-10-28: 2 g via INTRAVENOUS
  Filled 2013-10-27: qty 50

## 2013-10-28 ENCOUNTER — Inpatient Hospital Stay (HOSPITAL_COMMUNITY): Payer: Medicare Other

## 2013-10-28 ENCOUNTER — Inpatient Hospital Stay (HOSPITAL_COMMUNITY): Payer: Medicare Other | Admitting: Certified Registered Nurse Anesthetist

## 2013-10-28 ENCOUNTER — Encounter (HOSPITAL_COMMUNITY): Payer: Self-pay | Admitting: Certified Registered Nurse Anesthetist

## 2013-10-28 ENCOUNTER — Inpatient Hospital Stay (HOSPITAL_COMMUNITY)
Admission: RE | Admit: 2013-10-28 | Discharge: 2013-10-31 | DRG: 470 | Disposition: A | Discharge status: Home Health | Payer: Medicare Other | Source: Ambulatory Visit | Attending: Orthopaedic Surgery | Admitting: Orthopaedic Surgery

## 2013-10-28 ENCOUNTER — Encounter (HOSPITAL_COMMUNITY): Payer: Medicare Other | Admitting: Vascular Surgery

## 2013-10-28 ENCOUNTER — Encounter (HOSPITAL_COMMUNITY)
Admission: RE | Disposition: A | Discharge status: Home Health | Payer: Self-pay | Source: Ambulatory Visit | Attending: Orthopaedic Surgery

## 2013-10-28 DIAGNOSIS — K449 Diaphragmatic hernia without obstruction or gangrene: Secondary | ICD-10-CM | POA: Diagnosis present

## 2013-10-28 DIAGNOSIS — F3289 Other specified depressive episodes: Secondary | ICD-10-CM | POA: Diagnosis present

## 2013-10-28 DIAGNOSIS — I2510 Atherosclerotic heart disease of native coronary artery without angina pectoris: Secondary | ICD-10-CM | POA: Diagnosis present

## 2013-10-28 DIAGNOSIS — K219 Gastro-esophageal reflux disease without esophagitis: Secondary | ICD-10-CM | POA: Diagnosis present

## 2013-10-28 DIAGNOSIS — D62 Acute posthemorrhagic anemia: Secondary | ICD-10-CM | POA: Diagnosis not present

## 2013-10-28 DIAGNOSIS — M1611 Unilateral primary osteoarthritis, right hip: Secondary | ICD-10-CM

## 2013-10-28 DIAGNOSIS — M1610 Unilateral primary osteoarthritis, unspecified hip: Secondary | ICD-10-CM | POA: Diagnosis not present

## 2013-10-28 DIAGNOSIS — Z9089 Acquired absence of other organs: Secondary | ICD-10-CM

## 2013-10-28 DIAGNOSIS — F329 Major depressive disorder, single episode, unspecified: Secondary | ICD-10-CM | POA: Diagnosis present

## 2013-10-28 DIAGNOSIS — M169 Osteoarthritis of hip, unspecified: Secondary | ICD-10-CM | POA: Diagnosis not present

## 2013-10-28 DIAGNOSIS — J988 Other specified respiratory disorders: Secondary | ICD-10-CM | POA: Diagnosis present

## 2013-10-28 DIAGNOSIS — Z888 Allergy status to other drugs, medicaments and biological substances status: Secondary | ICD-10-CM

## 2013-10-28 DIAGNOSIS — M4120 Other idiopathic scoliosis, site unspecified: Secondary | ICD-10-CM | POA: Diagnosis not present

## 2013-10-28 DIAGNOSIS — J45909 Unspecified asthma, uncomplicated: Secondary | ICD-10-CM | POA: Diagnosis present

## 2013-10-28 DIAGNOSIS — Z825 Family history of asthma and other chronic lower respiratory diseases: Secondary | ICD-10-CM

## 2013-10-28 DIAGNOSIS — I10 Essential (primary) hypertension: Secondary | ICD-10-CM | POA: Diagnosis present

## 2013-10-28 DIAGNOSIS — Z9849 Cataract extraction status, unspecified eye: Secondary | ICD-10-CM

## 2013-10-28 DIAGNOSIS — Z823 Family history of stroke: Secondary | ICD-10-CM

## 2013-10-28 DIAGNOSIS — E890 Postprocedural hypothyroidism: Secondary | ICD-10-CM | POA: Diagnosis present

## 2013-10-28 DIAGNOSIS — Z8249 Family history of ischemic heart disease and other diseases of the circulatory system: Secondary | ICD-10-CM

## 2013-10-28 DIAGNOSIS — E785 Hyperlipidemia, unspecified: Secondary | ICD-10-CM | POA: Diagnosis present

## 2013-10-28 DIAGNOSIS — Z8542 Personal history of malignant neoplasm of other parts of uterus: Secondary | ICD-10-CM

## 2013-10-28 DIAGNOSIS — Z981 Arthrodesis status: Secondary | ICD-10-CM

## 2013-10-28 HISTORY — PX: TOTAL HIP ARTHROPLASTY: SHX124

## 2013-10-28 SURGERY — ARTHROPLASTY, HIP, TOTAL, ANTERIOR APPROACH
Anesthesia: General | Site: Hip | Laterality: Right

## 2013-10-28 MED ORDER — HYDROMORPHONE HCL PF 1 MG/ML IJ SOLN
INTRAMUSCULAR | Status: AC
  Filled 2013-10-28: qty 1

## 2013-10-28 MED ORDER — OXYCODONE HCL 5 MG/5ML PO SOLN: 5.0000 mg | Freq: Once | ORAL | Status: DC | PRN

## 2013-10-28 MED ORDER — METOCLOPRAMIDE HCL 10 MG PO TABS
5.0000 mg | ORAL_TABLET | Freq: Three times a day (TID) | ORAL | Status: DC | PRN
Start: 2013-10-28 — End: 2013-10-31

## 2013-10-28 MED ORDER — LIDOCAINE HCL (CARDIAC) 20 MG/ML IV SOLN
INTRAVENOUS | Status: AC
  Filled 2013-10-28: qty 5

## 2013-10-28 MED ORDER — FLEET ENEMA 7-19 GM/118ML RE ENEM: 1.0000 | ENEMA | Freq: Once | RECTAL | Status: AC | PRN

## 2013-10-28 MED ORDER — PROMETHAZINE HCL 25 MG/ML IJ SOLN
INTRAMUSCULAR | Status: AC
  Filled 2013-10-28: qty 1

## 2013-10-28 MED ORDER — ASPIRIN EC 325 MG PO TBEC
325.0000 mg | DELAYED_RELEASE_TABLET | Freq: Every day | ORAL | Status: DC
  Administered 2013-10-29 – 2013-10-31 (×3): 325 mg via ORAL
  Filled 2013-10-28 (×4): qty 1

## 2013-10-28 MED ORDER — PROMETHAZINE HCL 25 MG/ML IJ SOLN
6.2500 mg | INTRAMUSCULAR | Status: DC | PRN
  Administered 2013-10-28: 12.5 mg via INTRAVENOUS

## 2013-10-28 MED ORDER — SERTRALINE HCL 50 MG PO TABS
50.0000 mg | ORAL_TABLET | Freq: Every day | ORAL | Status: DC
  Administered 2013-10-28 – 2013-10-30 (×3): 50 mg via ORAL
  Filled 2013-10-28 (×4): qty 1

## 2013-10-28 MED ORDER — LEVOTHYROXINE SODIUM 75 MCG PO TABS
75.0000 ug | ORAL_TABLET | Freq: Every day | ORAL | Status: DC
  Administered 2013-10-29 – 2013-10-31 (×3): 75 ug via ORAL
  Filled 2013-10-28 (×4): qty 1

## 2013-10-28 MED ORDER — DIPHENHYDRAMINE HCL 12.5 MG/5ML PO ELIX: 12.5000 mg | ORAL_SOLUTION | ORAL | Status: DC | PRN

## 2013-10-28 MED ORDER — ALBUTEROL SULFATE (2.5 MG/3ML) 0.083% IN NEBU: 2.5000 mg | INHALATION_SOLUTION | Freq: Four times a day (QID) | RESPIRATORY_TRACT | Status: DC | PRN

## 2013-10-28 MED ORDER — METHOCARBAMOL 500 MG PO TABS
ORAL_TABLET | ORAL | Status: AC
  Filled 2013-10-28: qty 1

## 2013-10-28 MED ORDER — PROPOFOL 10 MG/ML IV BOLUS
INTRAVENOUS | Status: AC
  Filled 2013-10-28: qty 20

## 2013-10-28 MED ORDER — ESMOLOL HCL 10 MG/ML IV SOLN
INTRAVENOUS | Status: AC
  Filled 2013-10-28: qty 10

## 2013-10-28 MED ORDER — BISACODYL 10 MG RE SUPP
10.0000 mg | Freq: Every day | RECTAL | Status: DC | PRN
  Administered 2013-10-30: 10 mg via RECTAL
  Filled 2013-10-28 (×2): qty 1

## 2013-10-28 MED ORDER — ACETAMINOPHEN 325 MG PO TABS: 650.0000 mg | ORAL_TABLET | Freq: Four times a day (QID) | ORAL | Status: DC | PRN

## 2013-10-28 MED ORDER — MORPHINE SULFATE ER 15 MG PO TBCR
30.0000 mg | EXTENDED_RELEASE_TABLET | Freq: Every day | ORAL | Status: DC
  Administered 2013-10-28 – 2013-10-30 (×3): 30 mg via ORAL
  Filled 2013-10-28 (×3): qty 2

## 2013-10-28 MED ORDER — HYDROMORPHONE HCL PF 1 MG/ML IJ SOLN
1.0000 mg | INTRAMUSCULAR | Status: DC | PRN
  Administered 2013-10-28 (×2): 1 mg via INTRAVENOUS
  Filled 2013-10-28 (×2): qty 1

## 2013-10-28 MED ORDER — METOPROLOL TARTRATE 25 MG PO TABS
25.0000 mg | ORAL_TABLET | Freq: Two times a day (BID) | ORAL | Status: DC
  Administered 2013-10-30 (×2): 25 mg via ORAL
  Filled 2013-10-28 (×7): qty 1

## 2013-10-28 MED ORDER — BENAZEPRIL HCL 20 MG PO TABS
20.0000 mg | ORAL_TABLET | Freq: Every day | ORAL | Status: DC
  Administered 2013-10-28: 20 mg via ORAL
  Filled 2013-10-28 (×4): qty 1

## 2013-10-28 MED ORDER — MIDAZOLAM HCL 5 MG/5ML IJ SOLN
INTRAMUSCULAR | Status: DC | PRN
  Administered 2013-10-28: 2 mg via INTRAVENOUS

## 2013-10-28 MED ORDER — KCL IN DEXTROSE-NACL 20-5-0.45 MEQ/L-%-% IV SOLN
INTRAVENOUS | Status: DC
  Administered 2013-10-28: 18:00:00 via INTRAVENOUS
  Administered 2013-10-29: 75 mL/h via INTRAVENOUS
  Filled 2013-10-28 (×6): qty 1000

## 2013-10-28 MED ORDER — ALBUTEROL SULFATE HFA 108 (90 BASE) MCG/ACT IN AERS
1.0000 | INHALATION_SPRAY | Freq: Four times a day (QID) | RESPIRATORY_TRACT | Status: DC | PRN
Start: 2013-10-28 — End: 2013-10-28

## 2013-10-28 MED ORDER — NEOSTIGMINE METHYLSULFATE 10 MG/10ML IV SOLN
INTRAVENOUS | Status: AC
Start: 2013-10-28 — End: 2013-10-28
  Filled 2013-10-28: qty 1

## 2013-10-28 MED ORDER — METHOCARBAMOL 500 MG PO TABS
500.0000 mg | ORAL_TABLET | Freq: Four times a day (QID) | ORAL | Status: DC | PRN
  Administered 2013-10-28 – 2013-10-30 (×4): 500 mg via ORAL
  Filled 2013-10-28 (×4): qty 1

## 2013-10-28 MED ORDER — VECURONIUM BROMIDE 10 MG IV SOLR
INTRAVENOUS | Status: DC | PRN
  Administered 2013-10-28: 1 mg via INTRAVENOUS

## 2013-10-28 MED ORDER — TRAZODONE 25 MG HALF TABLET
25.0000 mg | ORAL_TABLET | Freq: Every day | ORAL | Status: DC
  Administered 2013-10-28 – 2013-10-30 (×3): 25 mg via ORAL
  Filled 2013-10-28 (×4): qty 1

## 2013-10-28 MED ORDER — ONDANSETRON HCL 4 MG/2ML IJ SOLN: 4.0000 mg | Freq: Four times a day (QID) | INTRAMUSCULAR | Status: DC | PRN

## 2013-10-28 MED ORDER — ATORVASTATIN CALCIUM 40 MG PO TABS
40.0000 mg | ORAL_TABLET | Freq: Every day | ORAL | Status: DC
  Administered 2013-10-28 – 2013-10-30 (×3): 40 mg via ORAL
  Filled 2013-10-28 (×4): qty 1

## 2013-10-28 MED ORDER — PHENOL 1.4 % MT LIQD: 1.0000 | OROMUCOSAL | Status: DC | PRN

## 2013-10-28 MED ORDER — CHLORHEXIDINE GLUCONATE 4 % EX LIQD
60.0000 mL | Freq: Once | CUTANEOUS | Status: DC
  Filled 2013-10-28: qty 60

## 2013-10-28 MED ORDER — MENTHOL 3 MG MT LOZG: 1.0000 | LOZENGE | OROMUCOSAL | Status: DC | PRN

## 2013-10-28 MED ORDER — MIDAZOLAM HCL 2 MG/2ML IJ SOLN
INTRAMUSCULAR | Status: AC
  Filled 2013-10-28: qty 2

## 2013-10-28 MED ORDER — LACTATED RINGERS IV SOLN
INTRAVENOUS | Status: DC
  Administered 2013-10-28: 10:00:00 via INTRAVENOUS

## 2013-10-28 MED ORDER — STERILE WATER FOR INJECTION IJ SOLN
INTRAMUSCULAR | Status: AC
  Filled 2013-10-28: qty 10

## 2013-10-28 MED ORDER — ZOLPIDEM TARTRATE 5 MG PO TABS: 5.0000 mg | ORAL_TABLET | Freq: Every evening | ORAL | Status: DC | PRN

## 2013-10-28 MED ORDER — METHOCARBAMOL 500 MG PO TABS: 500.0000 mg | ORAL_TABLET | Freq: Four times a day (QID) | ORAL | Status: DC | PRN

## 2013-10-28 MED ORDER — PROPOFOL 10 MG/ML IV BOLUS
INTRAVENOUS | Status: DC | PRN
  Administered 2013-10-28: 150 mg via INTRAVENOUS

## 2013-10-28 MED ORDER — OXYCODONE HCL 5 MG PO TABS
15.0000 mg | ORAL_TABLET | ORAL | Status: DC | PRN
  Administered 2013-10-28: 15 mg via ORAL
  Administered 2013-10-29: 10 mg via ORAL
  Administered 2013-10-29 – 2013-10-31 (×7): 15 mg via ORAL
  Filled 2013-10-28 (×9): qty 3

## 2013-10-28 MED ORDER — GLYCOPYRROLATE 0.2 MG/ML IJ SOLN
INTRAMUSCULAR | Status: AC
  Filled 2013-10-28: qty 3

## 2013-10-28 MED ORDER — FENTANYL CITRATE 0.05 MG/ML IJ SOLN
INTRAMUSCULAR | Status: DC | PRN
Start: 2013-10-28 — End: 2013-10-28
  Administered 2013-10-28 (×2): 50 ug via INTRAVENOUS
  Administered 2013-10-28: 100 ug via INTRAVENOUS
  Administered 2013-10-28 (×4): 50 ug via INTRAVENOUS
  Administered 2013-10-28: 100 ug via INTRAVENOUS

## 2013-10-28 MED ORDER — ASPIRIN EC 325 MG PO TBEC: 325.0000 mg | DELAYED_RELEASE_TABLET | Freq: Every day | ORAL | Status: DC

## 2013-10-28 MED ORDER — GLYCOPYRROLATE 0.2 MG/ML IJ SOLN
INTRAMUSCULAR | Status: DC | PRN
  Administered 2013-10-28: 0.6 mg via INTRAVENOUS

## 2013-10-28 MED ORDER — OXYCODONE HCL 5 MG PO TABS: 5.0000 mg | ORAL_TABLET | Freq: Once | ORAL | Status: DC | PRN

## 2013-10-28 MED ORDER — ONDANSETRON HCL 4 MG/2ML IJ SOLN
INTRAMUSCULAR | Status: AC
  Filled 2013-10-28: qty 2

## 2013-10-28 MED ORDER — HYDROCHLOROTHIAZIDE 12.5 MG PO CAPS
12.5000 mg | ORAL_CAPSULE | Freq: Every day | ORAL | Status: DC
  Administered 2013-10-28: 12.5 mg via ORAL
  Filled 2013-10-28 (×4): qty 1

## 2013-10-28 MED ORDER — ONDANSETRON HCL 4 MG PO TABS: 4.0000 mg | ORAL_TABLET | Freq: Four times a day (QID) | ORAL | Status: DC | PRN

## 2013-10-28 MED ORDER — OXYCODONE-ACETAMINOPHEN 5-325 MG PO TABS: 1.0000 | ORAL_TABLET | ORAL | Status: DC | PRN

## 2013-10-28 MED ORDER — ARTIFICIAL TEARS OP OINT
TOPICAL_OINTMENT | OPHTHALMIC | Status: AC
  Filled 2013-10-28: qty 3.5

## 2013-10-28 MED ORDER — SENNOSIDES-DOCUSATE SODIUM 8.6-50 MG PO TABS: 1.0000 | ORAL_TABLET | Freq: Every evening | ORAL | Status: DC | PRN

## 2013-10-28 MED ORDER — DOCUSATE SODIUM 100 MG PO CAPS
100.0000 mg | ORAL_CAPSULE | Freq: Two times a day (BID) | ORAL | Status: DC
  Administered 2013-10-28 – 2013-10-31 (×6): 100 mg via ORAL
  Filled 2013-10-28 (×7): qty 1

## 2013-10-28 MED ORDER — ONDANSETRON HCL 4 MG/2ML IJ SOLN
INTRAMUSCULAR | Status: DC | PRN
  Administered 2013-10-28: 4 mg via INTRAVENOUS

## 2013-10-28 MED ORDER — NEOSTIGMINE METHYLSULFATE 10 MG/10ML IV SOLN
INTRAVENOUS | Status: DC | PRN
  Administered 2013-10-28: 4 mg via INTRAVENOUS

## 2013-10-28 MED ORDER — ROCURONIUM BROMIDE 50 MG/5ML IV SOLN
INTRAVENOUS | Status: AC
  Filled 2013-10-28: qty 1

## 2013-10-28 MED ORDER — LIDOCAINE HCL (CARDIAC) 20 MG/ML IV SOLN
INTRAVENOUS | Status: DC | PRN
  Administered 2013-10-28: 50 mg via INTRAVENOUS

## 2013-10-28 MED ORDER — OXYCODONE HCL 5 MG PO TABS
ORAL_TABLET | ORAL | Status: AC
  Filled 2013-10-28: qty 3

## 2013-10-28 MED ORDER — FENTANYL CITRATE 0.05 MG/ML IJ SOLN
INTRAMUSCULAR | Status: AC
  Filled 2013-10-28: qty 5

## 2013-10-28 MED ORDER — METOCLOPRAMIDE HCL 5 MG/ML IJ SOLN: 5.0000 mg | Freq: Three times a day (TID) | INTRAMUSCULAR | Status: DC | PRN

## 2013-10-28 MED ORDER — ACETAMINOPHEN 650 MG RE SUPP: 650.0000 mg | Freq: Four times a day (QID) | RECTAL | Status: DC | PRN

## 2013-10-28 MED ORDER — ROCURONIUM BROMIDE 100 MG/10ML IV SOLN
INTRAVENOUS | Status: DC | PRN
  Administered 2013-10-28: 50 mg via INTRAVENOUS

## 2013-10-28 MED ORDER — MOMETASONE FURO-FORMOTEROL FUM 200-5 MCG/ACT IN AERO
2.0000 | INHALATION_SPRAY | Freq: Two times a day (BID) | RESPIRATORY_TRACT | Status: DC
  Administered 2013-10-28 – 2013-10-31 (×6): 2 via RESPIRATORY_TRACT
  Filled 2013-10-28 (×2): qty 8.8

## 2013-10-28 MED ORDER — DEXTROSE 5 % IV SOLN
500.0000 mg | Freq: Four times a day (QID) | INTRAVENOUS | Status: DC | PRN
  Filled 2013-10-28: qty 5

## 2013-10-28 MED ORDER — LACTATED RINGERS IV SOLN
INTRAVENOUS | Status: DC | PRN
  Administered 2013-10-28 (×2): via INTRAVENOUS

## 2013-10-28 MED ORDER — HYDROMORPHONE HCL PF 1 MG/ML IJ SOLN
0.2500 mg | INTRAMUSCULAR | Status: DC | PRN
  Administered 2013-10-28: 1 mg via INTRAVENOUS

## 2013-10-28 MED ORDER — IPRATROPIUM-ALBUTEROL 0.5-2.5 (3) MG/3ML IN SOLN: 3.0000 mL | RESPIRATORY_TRACT | Status: DC | PRN

## 2013-10-28 MED ORDER — KETOROLAC TROMETHAMINE 15 MG/ML IJ SOLN
15.0000 mg | Freq: Four times a day (QID) | INTRAMUSCULAR | Status: AC
  Administered 2013-10-28 – 2013-10-29 (×4): 15 mg via INTRAVENOUS
  Filled 2013-10-28 (×7): qty 1

## 2013-10-28 MED ORDER — EPHEDRINE SULFATE 50 MG/ML IJ SOLN
INTRAMUSCULAR | Status: AC
  Filled 2013-10-28: qty 1

## 2013-10-28 SURGICAL SUPPLY — 51 items
ADH SKN CLS LQ APL DERMABOND (GAUZE/BANDAGES/DRESSINGS) ×1
BLADE SAW SGTL 18X1.27X75 (BLADE) ×2 IMPLANT
BLADE SURG ROTATE 9660 (MISCELLANEOUS) IMPLANT
CAPT HIP PF MOP ×1 IMPLANT
CELLS DAT CNTRL 66122 CELL SVR (MISCELLANEOUS) ×1 IMPLANT
COVER SURGICAL LIGHT HANDLE (MISCELLANEOUS) ×2 IMPLANT
DERMABOND ADHESIVE PROPEN (GAUZE/BANDAGES/DRESSINGS) ×1
DERMABOND ADVANCED .7 DNX6 (GAUZE/BANDAGES/DRESSINGS) ×1 IMPLANT
DRAPE C-ARM 42X72 X-RAY (DRAPES) ×2 IMPLANT
DRAPE STERI IOBAN 125X83 (DRAPES) ×2 IMPLANT
DRAPE U-SHAPE 47X51 STRL (DRAPES) ×6 IMPLANT
DRSG AQUACEL AG ADV 3.5X10 (GAUZE/BANDAGES/DRESSINGS) ×1 IMPLANT
DRSG MEPILEX BORDER 4X8 (GAUZE/BANDAGES/DRESSINGS) ×2 IMPLANT
DURAPREP 26ML APPLICATOR (WOUND CARE) ×2 IMPLANT
ELECT BLADE 4.0 EZ CLEAN MEGAD (MISCELLANEOUS)
ELECT BLADE TIP CTD 4 INCH (ELECTRODE) ×2 IMPLANT
ELECT CAUTERY BLADE 6.4 (BLADE) ×2 IMPLANT
ELECT REM PT RETURN 9FT ADLT (ELECTROSURGICAL) ×2
ELECTRODE BLDE 4.0 EZ CLN MEGD (MISCELLANEOUS) IMPLANT
ELECTRODE REM PT RTRN 9FT ADLT (ELECTROSURGICAL) ×1 IMPLANT
FACESHIELD WRAPAROUND (MASK) ×4 IMPLANT
FACESHIELD WRAPAROUND OR TEAM (MASK) ×2 IMPLANT
GLOVE BIOGEL PI IND STRL 7.5 (GLOVE) ×1 IMPLANT
GLOVE BIOGEL PI IND STRL 8 (GLOVE) ×1 IMPLANT
GLOVE BIOGEL PI INDICATOR 7.5 (GLOVE) ×1
GLOVE BIOGEL PI INDICATOR 8 (GLOVE) ×1
GLOVE ECLIPSE 7.0 STRL STRAW (GLOVE) ×2 IMPLANT
GLOVE ORTHO TXT STRL SZ7.5 (GLOVE) ×2 IMPLANT
GOWN STRL REUS W/ TWL LRG LVL3 (GOWN DISPOSABLE) ×2 IMPLANT
GOWN STRL REUS W/ TWL XL LVL3 (GOWN DISPOSABLE) ×1 IMPLANT
GOWN STRL REUS W/TWL LRG LVL3 (GOWN DISPOSABLE) ×4
GOWN STRL REUS W/TWL XL LVL3 (GOWN DISPOSABLE) ×2
KIT BASIN OR (CUSTOM PROCEDURE TRAY) ×2 IMPLANT
KIT ROOM TURNOVER OR (KITS) ×2 IMPLANT
MANIFOLD NEPTUNE II (INSTRUMENTS) ×2 IMPLANT
NS IRRIG 1000ML POUR BTL (IV SOLUTION) ×2 IMPLANT
PACK TOTAL JOINT (CUSTOM PROCEDURE TRAY) ×2 IMPLANT
PAD ARMBOARD 7.5X6 YLW CONV (MISCELLANEOUS) ×4 IMPLANT
RETRACTOR WND ALEXIS 18 MED (MISCELLANEOUS) ×1 IMPLANT
RTRCTR WOUND ALEXIS 18CM MED (MISCELLANEOUS) ×2
SUT ETHIBOND NAB CT1 #1 30IN (SUTURE) IMPLANT
SUT VIC AB 0 CT1 27 (SUTURE) ×2
SUT VIC AB 0 CT1 27XBRD ANBCTR (SUTURE) ×1 IMPLANT
SUT VIC AB 2-0 CT1 27 (SUTURE) ×2
SUT VIC AB 2-0 CT1 TAPERPNT 27 (SUTURE) ×1 IMPLANT
SUT VICRYL 4-0 PS2 18IN ABS (SUTURE) ×2 IMPLANT
SUT VLOC 180 0 24IN GS25 (SUTURE) ×2 IMPLANT
TOWEL OR 17X24 6PK STRL BLUE (TOWEL DISPOSABLE) ×2 IMPLANT
TOWEL OR 17X26 10 PK STRL BLUE (TOWEL DISPOSABLE) ×4 IMPLANT
TRAY FOLEY CATH 16FRSI W/METER (SET/KITS/TRAYS/PACK) IMPLANT
WATER STERILE IRR 1000ML POUR (IV SOLUTION) ×4 IMPLANT

## 2013-10-28 NOTE — Brief Op Note (Cosign Needed)
10/28/2013  2:58 PM  PATIENT:  Hannah Scott  63 y.o. female  PRE-OPERATIVE DIAGNOSIS:  Right Hip Osteoarthritis  POST-OPERATIVE DIAGNOSIS:  Right Hip Osteoarthritis  PROCEDURE:  Procedure(s) with comments: TOTAL HIP ARTHROPLASTY ANTERIOR APPROACH (Right) - Right Total Hip Arthroplasty, Direct Anterior Approach  SURGEON:  Surgeon(s) and Role:    * Marybelle Killings, MD - Primary  PHYSICIAN ASSISTANT: [facility]  ASSISTANTS: none   ANESTHESIA:   general  EBL:  Total I/O In: 1000 [I.V.:1000] Out: 350 [Blood:350]  BLOOD ADMINISTERED:none  DRAINS: none   LOCAL MEDICATIONS USED:  NONE  SPECIMEN:  No Specimen  DISPOSITION OF SPECIMEN:  N/A  COUNTS:  YES  TOURNIQUET:  * No tourniquets in log *  DICTATION: .Note written in EPIC  PLAN OF CARE: Admit to inpatient   PATIENT DISPOSITION:  PACU - hemodynamically stable.   Delay start of Pharmacological VTE agent (>24hrs) due to surgical blood loss or risk of bleeding: no

## 2013-10-28 NOTE — Progress Notes (Signed)
MD on call Notified that Pt had a bp of 102/38 hr 85 pt was scheduled to receive 25mg  of lopressor that she takes BID medication was placed on hold while awaiting MD order. MD states to hold medication for that dose. Arthor Captain LPN

## 2013-10-28 NOTE — Anesthesia Procedure Notes (Signed)
Procedure Name: Intubation Date/Time: 10/28/2013 12:37 PM Performed by: Neldon Newport Pre-anesthesia Checklist: Patient identified, Timeout performed, Emergency Drugs available, Suction available and Patient being monitored Patient Re-evaluated:Patient Re-evaluated prior to inductionOxygen Delivery Method: Circle system utilized Preoxygenation: Pre-oxygenation with 100% oxygen Intubation Type: IV induction Ventilation: Mask ventilation without difficulty Laryngoscope Size: Mac and 3 Grade View: Grade I Tube type: Oral Tube size: 7.0 mm Number of attempts: 1 Placement Confirmation: positive ETCO2,  ETT inserted through vocal cords under direct vision and breath sounds checked- equal and bilateral Secured at: 22 cm Tube secured with: Tape Dental Injury: Teeth and Oropharynx as per pre-operative assessment

## 2013-10-28 NOTE — Discharge Instructions (Signed)
Keep hip incision dry for 5 days post op then may wet while bathing. Therapy daily . Call if fever or chills or increased drainage. Go to ER if acutely short of breath or call for ambulance. Return for follow up in 2 weeks. May full weight bear on the surgical leg unless told otherwise. In house walking for first 2 weeks.  Ice packs as needed for pain and swelling

## 2013-10-28 NOTE — Transfer of Care (Signed)
Immediate Anesthesia Transfer of Care Note  Patient: Hannah Scott  Procedure(s) Performed: Procedure(s) with comments: TOTAL HIP ARTHROPLASTY ANTERIOR APPROACH (Right) - Right Total Hip Arthroplasty, Direct Anterior Approach  Patient Location: PACU  Anesthesia Type:General  Level of Consciousness: awake and alert   Airway & Oxygen Therapy: Patient Spontanous Breathing and Patient connected to nasal cannula oxygen  Post-op Assessment: Report given to PACU RN, Post -op Vital signs reviewed and stable and Patient moving all extremities X 4  Post vital signs: Reviewed and stable  Complications: No apparent anesthesia complications

## 2013-10-28 NOTE — Anesthesia Postprocedure Evaluation (Signed)
Anesthesia Post Note  Patient: Hannah Scott  Procedure(s) Performed: Procedure(s) (LRB): TOTAL HIP ARTHROPLASTY ANTERIOR APPROACH (Right)  Anesthesia type: General  Patient location: PACU  Post pain: Pain level controlled  Post assessment: Patient's Cardiovascular Status Stable  Last Vitals:  Filed Vitals:   10/28/13 1600  BP: 137/68  Pulse: 58  Temp:   Resp: 17    Post vital signs: Reviewed and stable  Level of consciousness: alert  Complications: No apparent anesthesia complications

## 2013-10-28 NOTE — Interval H&P Note (Signed)
History and Physical Interval Note:  10/28/2013 11:23 AM  Hannah Scott  has presented today for surgery, with the diagnosis of Right Hip Osteoarthritis  The various methods of treatment have been discussed with the patient and family. After consideration of risks, benefits and other options for treatment, the patient has consented to  Procedure(s) with comments: TOTAL HIP ARTHROPLASTY ANTERIOR APPROACH (Right) - Right Total Hip Arthroplasty, Direct Anterior Approach as a surgical intervention .  The patient's history has been reviewed, patient examined, no change in status, stable for surgery.  I have reviewed the patient's chart and labs.  Questions were answered to the patient's satisfaction.     Darline Faith C

## 2013-10-28 NOTE — Plan of Care (Signed)
Problem: Consults Goal: Diagnosis- Total Joint Replacement Primary Total Hip Right     

## 2013-10-29 ENCOUNTER — Encounter (HOSPITAL_COMMUNITY): Payer: Self-pay | Admitting: General Practice

## 2013-10-29 LAB — BASIC METABOLIC PANEL
Anion gap: 15 (ref 5–15)
BUN: 16 mg/dL (ref 6–23)
CO2: 22 mEq/L (ref 19–32)
Calcium: 8.3 mg/dL — ABNORMAL LOW (ref 8.4–10.5)
Chloride: 98 mEq/L (ref 96–112)
Creatinine, Ser: 1.03 mg/dL (ref 0.50–1.10)
GFR calc Af Amer: 65 mL/min — ABNORMAL LOW (ref >90)
GFR, EST NON AFRICAN AMERICAN: 56 mL/min — AB (ref >90)
GLUCOSE: 133 mg/dL — AB (ref 70–99)
Potassium: 4.6 mEq/L (ref 3.7–5.3)
Sodium: 135 mEq/L — ABNORMAL LOW (ref 137–147)

## 2013-10-29 LAB — CBC
HEMATOCRIT: 31.3 % — AB (ref 36.0–46.0)
Hemoglobin: 9.9 g/dL — ABNORMAL LOW (ref 12.0–15.0)
MCH: 30.6 pg (ref 26.0–34.0)
MCHC: 31.6 g/dL (ref 30.0–36.0)
MCV: 96.6 fL (ref 78.0–100.0)
Platelets: 346 10*3/uL (ref 150–400)
RBC: 3.24 MIL/uL — ABNORMAL LOW (ref 3.87–5.11)
RDW: 13 % (ref 11.5–15.5)
WBC: 8.3 10*3/uL (ref 4.0–10.5)

## 2013-10-29 NOTE — Progress Notes (Signed)
Subjective: 1 Day Post-Op Procedure(s) (LRB): TOTAL HIP ARTHROPLASTY ANTERIOR APPROACH (Right) Patient reports pain as 7 on 0-10 scale.    Objective: Vital signs in last 24 hours: Temp:  [97.4 F (36.3 C)-98.3 F (36.8 C)] 97.5 F (36.4 C) (07/07 0537) Pulse Rate:  [53-148] 79 (07/07 0537) Resp:  [15-23] 16 (07/07 0537) BP: (82-164)/(38-81) 82/40 mmHg (07/07 0700) SpO2:  [92 %-100 %] 99 % (07/07 0537) Weight:  [76.658 kg (169 lb)] 76.658 kg (169 lb) (07/06 0956)  Intake/Output from previous day: 07/06 0701 - 07/07 0700 In: 1400 [I.V.:1400] Out: 350 [Blood:350] Intake/Output this shift:     Recent Labs  10/29/13 0555  HGB 9.9*    Recent Labs  10/29/13 0555  WBC 8.3  RBC 3.24*  HCT 31.3*  PLT 346    Recent Labs  10/29/13 0555  NA 135*  K 4.6  CL 98  CO2 22  BUN 16  CREATININE 1.03  GLUCOSE 133*  CALCIUM 8.3*   No results found for this basename: LABPT, INR,  in the last 72 hours  Neurologically intact  Assessment/Plan: 1 Day Post-Op Procedure(s) (LRB): TOTAL HIP ARTHROPLASTY ANTERIOR APPROACH (Right) Up with therapy , she was only able to ambulate to BR before surgery for several weeks. She is deconditioned and will be slow and weak with PT.   YATES,MARK C 10/29/2013, 7:45 AM

## 2013-10-29 NOTE — Evaluation (Signed)
Physical Therapy Evaluation Patient Details Name: Hannah Scott MRN: [id number] DOB: [birth year] Today's Date: 10/29/2013   History of Present Illness  pt presents post R THA.    Clinical Impression  Pt very painful and c/o weakness in Bil UEs.  Pt unable to amb today and had to have 3-in-1 removed and recliner placed behind pt as pt states too weak to attempt to pivot to recliner.  Will continue to follow, but may need to consider SNF pending pt progress.      Follow Up Recommendations Home health PT;Supervision/Assistance - 24 hour    Equipment Recommendations  Rolling walker with 5" wheels;3in1 (PT)    Recommendations for Other Services       Precautions / Restrictions Precautions Precautions: Fall Precaution Comments: Direct Anterior Restrictions Weight Bearing Restrictions: Yes RLE Weight Bearing: Weight bearing as tolerated      Mobility  Bed Mobility               General bed mobility comments: pt sitting on 3-in-1 on arrival.    Transfers Overall transfer level: Needs assistance Equipment used: Rolling walker (2 wheeled) Transfers: Sit to/from Stand Sit to Stand: Mod assist         General transfer comment: cues for UE use, trunk/hip extension with coming to stand.  cues to control descent to sitting.    Ambulation/Gait                Stairs            Wheelchair Mobility    Modified Rankin (Stroke Patients Only)       Balance Overall balance assessment: Needs assistance Sitting-balance support: Single extremity supported;Feet supported Sitting balance-Leahy Scale: Poor     Standing balance support: Bilateral upper extremity supported Standing balance-Leahy Scale: Poor Standing balance comment: pt relies heavily on RW.                               Pertinent Vitals/Pain 7-8/10.  Rang for pain meds.      Home Living Family/patient expects to be discharged to:: Private residence Living Arrangements:  Alone Available Help at Discharge: Family;Available 24 hours/day Type of Home: House Home Access: Stairs to enter Entrance Stairs-Rails: Right Entrance Stairs-Number of Steps: 2 Home Layout: One level Home Equipment: None Additional Comments: pt's family lives near by and plans to take turns caring for pt.      Prior Function Level of Independence: Independent               Hand Dominance        Extremity/Trunk Assessment   Upper Extremity Assessment: Defer to OT evaluation           Lower Extremity Assessment: RLE deficits/detail RLE Deficits / Details: Strength and ROM limited by pain.      Cervical / Trunk Assessment: Kyphotic  Communication   Communication: No difficulties  Cognition Arousal/Alertness: Awake/alert Behavior During Therapy: WFL for tasks assessed/performed Overall Cognitive Status: Within Functional Limits for tasks assessed                      General Comments      Exercises Total Joint Exercises Ankle Circles/Pumps: AROM;Both;10 reps Quad Sets: AROM;Both;10 reps Long Arc Quad: AROM;Right;10 reps      Assessment/Plan    PT Assessment Patient needs continued PT services  PT Diagnosis Abnormality of gait;Acute pain   PT Problem List  Decreased strength;Decreased range of motion;Decreased activity tolerance;Decreased balance;Decreased mobility;Decreased knowledge of use of DME;Pain  PT Treatment Interventions DME instruction;Stair training;Gait training;Functional mobility training;Therapeutic activities;Therapeutic exercise;Balance training;Patient/family education   PT Goals (Current goals can be found in the Care Plan section) Acute Rehab PT Goals Patient Stated Goal: Walk without pain.   PT Goal Formulation: With patient Time For Goal Achievement: 11/05/13 Potential to Achieve Goals: Good    Frequency 7X/week   Barriers to discharge        Co-evaluation               End of Session Equipment Utilized  During Treatment: Gait belt Activity Tolerance: Patient tolerated treatment well Patient left: in chair;with call bell/phone within reach Nurse Communication: Mobility status         Time: 6712-4580 PT Time Calculation (min): 24 min   Charges:   PT Evaluation $Initial PT Evaluation Tier I: 1 Procedure PT Treatments $Therapeutic Activity: 8-22 mins   PT G CodesCatarina Hartshorn, [place] [phone] 10/29/2013, 11:45 AM

## 2013-10-29 NOTE — Evaluation (Signed)
Occupational Therapy Evaluation Patient Details Name: Hannah Scott MRN: [id number] DOB: 27-Jun-1950 Today's Date: 10/29/2013    History of Present Illness pt presents post R THA.     Clinical Impression   Pt admitted with the above diagnoses and presents with below problem list. Pt will benefit from continued acute OT to address the below listed deficits and maximize independence with basic ADLs prior to d/c home with 24 hour supervision/assistance. PTA pt was mod I with ADLs, using a cane and then eventually walker just before admission. Pt is currently performing LB ADLs with mod A. Pt completed stand-pivot to [facility] with Mod A for sit>stand and min A for pivoting. Pt states her son and daughter can provide 24 hour supervision/assistance at d/c.      Follow Up Recommendations  Supervision/Assistance - 24 hour;No OT follow up    Equipment Recommendations  None recommended by OT;Other (comment) (pt has equipment recommended by OT)    Recommendations for Other Services       Precautions / Restrictions Precautions Precautions: Fall Precaution Comments: Direct Anterior Restrictions Weight Bearing Restrictions: Yes RLE Weight Bearing: Weight bearing as tolerated      Mobility Bed Mobility               General bed mobility comments: pt in recliner  Transfers Overall transfer level: Needs assistance Equipment used: Rolling walker (2 wheeled) Transfers: Sit to/from [facility] Sit to Stand: Mod assist Stand pivot transfers: Min assist       General transfer comment: cues for hand placement, technique, walker placement in front during pivoting    Balance Overall balance assessment: Needs assistance Sitting-balance support: Single extremity supported;Feet supported Sitting balance-Leahy Scale: Poor     Standing balance support: Bilateral upper extremity supported;During functional activity Standing balance-Leahy Scale: Poor Standing balance comment:  relies on RW for balance                             ADL Overall ADL's : Needs assistance/impaired Eating/Feeding: Set up;Sitting   Grooming: Set up;Sitting   Upper Body Bathing: Set up;Sitting   Lower Body Bathing: Moderate assistance;Sit to/from stand   Upper Body Dressing : Set up;Sitting   Lower Body Dressing: Moderate assistance;Sit to/from stand;With adaptive equipment   Toilet Transfer: Moderate assistance;Stand-pivot;BSC;RW   Toileting- Clothing Manipulation and Hygiene: Minimal assistance;Sit to/from stand   Tub/ Banker: Moderate assistance;Stand-pivot;3 in 1;Rolling walker   Functional mobility during ADLs: Minimal assistance;Rolling walker General ADL Comments: Min A functional mobility to steady balance. Educated pt on techniques and use of AE for ADLs during recovery. Educated on importance of having someone with her 24 hour/day at time of d/c. Discussed shower transfer options.      Vision                     Perception     Praxis      Pertinent Vitals/Pain 4/10 pain in RLE. Initial vitals: O2: 93, BP: 84/52, bpm: 84; after stand pivot to BSC: O2: 95.      Hand Dominance     Extremity/Trunk Assessment Upper Extremity Assessment Upper Extremity Assessment: Overall WFL for tasks assessed;Generalized weakness   Lower Extremity Assessment Lower Extremity Assessment: Defer to PT evaluation RLE Deficits / Details: Strength and ROM limited by pain.   RLE: Unable to fully assess due to pain RLE Coordination: decreased fine motor;decreased gross motor   Cervical / Trunk  Assessment Cervical / Trunk Assessment: Kyphotic   Communication Communication Communication: No difficulties   Cognition Arousal/Alertness: Awake/alert;Lethargic Behavior During Therapy: WFL for tasks assessed/performed Overall Cognitive Status: Within Functional Limits for tasks assessed                     General Comments       Exercises  Exercises: Total Joint     Shoulder Instructions      Home Living Family/patient expects to be discharged to:: Private residence Living Arrangements: Alone Available Help at Discharge: Family;Available 24 hours/day Type of Home: House Home Access: Stairs to enter [employer] of Steps: 2 Entrance Stairs-Rails: Right Home Layout: One level     Bathroom Shower/Tub: Teacher, early years/pre: Standard Bathroom Accessibility: Yes How Accessible: Accessible via walker Home Equipment: Bedside commode;Tub bench;Adaptive equipment Adaptive Equipment: Reacher Additional Comments: pt's family lives near by and plans to take turns caring for pt.  Pt reports she has an alternative entrance to house with ramp and 1 step with rails on right.      Prior Functioning/Environment Level of Independence: Independent with assistive device(s)        Comments: pt reports using cane and eventually walker prior to admission    OT Diagnosis: Generalized weakness;Acute pain   OT Problem List: Impaired balance (sitting and/or standing);Decreased knowledge of precautions;Decreased knowledge of use of DME or AE;Pain;Decreased strength   OT Treatment/Interventions: Self-care/ADL training;Therapeutic exercise;DME and/or AE instruction;Therapeutic activities;Patient/family education;Balance training    OT Goals(Current goals can be found in the care plan section) Acute Rehab OT Goals Patient Stated Goal: walk better OT Goal Formulation: With patient Time For Goal Achievement: 11/05/13 Potential to Achieve Goals: Good ADL Goals Pt Will Perform Lower Body Bathing: with supervision;with adaptive equipment;sit to/from stand Pt Will Perform Lower Body Dressing: with supervision;with adaptive equipment;sit to/from stand Pt Will Transfer to Toilet: with supervision;ambulating (3n1 over toilet) Pt Will Perform Toileting - Clothing Manipulation and hygiene: with supervision;sit to/from  stand Pt Will Perform Tub/Shower Transfer: with supervision;ambulating;3 in 1;rolling walker  OT Frequency: Min 3X/week   Barriers to D/C:            Co-evaluation              End of Session Equipment Utilized During Treatment: Gait belt;Rolling walker Nurse Communication: Other (comment) (vitals, emesis)  Activity Tolerance: Patient limited by lethargy;Patient limited by pain Patient left: in chair;with call bell/phone within reach   Time: 1031-1118 OT Time Calculation (min): 47 min Charges:  OT General Charges $OT Visit: 1 Procedure OT Evaluation $Initial OT Evaluation Tier I: 1 Procedure OT Treatments $Self Care/Home Management : 23-37 mins G-Codes:    Hortencia Pilar 11/15/13, 12:24 PM

## 2013-10-29 NOTE — Care Management Note (Signed)
CARE MANAGEMENT NOTE 10/29/2013  Patient:  Hannah Scott, Hannah Scott   Account Number:  [id number]  Date Initiated:  10/29/2013  Documentation initiated by:  Ricki Miller  Subjective/Objective Assessment:   63 yr old female s/p right total hip arthroplasty.     Action/Plan:   Case manager spoke with patient concerning home health and DME needs. She has rolling walker and 3in1. Will wait for PT eval to determine HH needs.   Anticipated DC Date:  10/30/2013   Anticipated DC Plan:        DC Planning Services  CM consult      Choice offered to / List presented to:  C-1 Patient           [facility].   Status of service:  In process, will continue to follow

## 2013-10-29 NOTE — Op Note (Signed)
NAME:  Hannah Scott, Hannah Scott                   ACCOUNT NO.:  [id number]  MEDICAL RECORD NO.:  [id number]  LOCATION:  [id number]                        FACILITY:  [facility]  PHYSICIAN:  Equilla Que C. Lorin Mercy, M.D.    DATE OF BIRTH:  18-Sep-1950  DATE OF PROCEDURE:  10/28/2013 DATE OF DISCHARGE:                              OPERATIVE REPORT   PREOPERATIVE DIAGNOSIS:  Right hip osteoarthritis.  POSTOPERATIVE DIAGNOSIS:  Right hip osteoarthritis.  PROCEDURE:  Right tunnel total hip arthroplasty.  SURGEON:  Giovonni Poirier C. Lorin Mercy, M.D.  ASSISTANT:  Phillips Hay, PA-C, medically necessary and present for the entire procedure.  ANESTHESIA:  General.  COMPONENTS:  Corail #11 stem, +5 mm neck, 54 mm no hole acetabular shell, with 32 mm ball and polyethylene liner.  COMPLICATIONS:  None.  INDICATIONS:  This 63 year old female has had progressive hip osteoarthritis for years.  She has had intra-articular injections.  Pain got so severe, she cannot walk with an ambulator.  She cannot walk with a walker and has been nonambulatory for the last 2 weeks.  Going from bed to chair and having difficulty making it to the bathroom.  Hip x-ray show bone-on-bone changes.  PROCEDURE IN DETAIL:  After induction of general anesthesia, the patient was placed on the [facility] table with both feet placed in boots carefully. The patient had severe scoliosis, hip flexion contracture, and still had 20-degree hip flexion contracture even under general anesthesia. Tincture benzoin tape was applied to the panniculus to expose the groin. Two 10/15 drapes were applied followed by DuraPrep.  Preoperative Ancef was given prophylactically.  Sterile sticky drapes were applied.  The large shower curtain Betadine, Steri-Drape half sheet above and across the opposite side for the second assistant.  Time-out procedure was completed.  Direct anterior approach was made.  Subcutaneous tissue was sharply dissected with Bovie electrocautery.  Fascia was cleaned  off. The subcutaneous tissue was split, elevated with an Allis, and direct anterior interval was identified with blunt __________ placed over the top of the anterior capsule medially.  The second cover was placed laterally over the neck.  Capsule was opened after dividing fully through the fat identifying the transverse __________.  Capsule was opened.  It almost appeared as if the patient had a femoral neck fracture that had healed with nonunion due to the large joint spurs.  It was difficult to identify the head from the neck due to the large spur formation which is not well appreciated on plain radiograph.  My oscillating saw was placed in the area that felt to represent the neck was actually too low, was readjusted directly under C-arm.  Once it was in good position, angulation neck was cut.  Head was removed with a corkscrew with some difficulty, and there was raw burnished eburnated bone and the weightbearing portion of the head without changes to suggest avascular necrosis.  There was no evidence of infection. Sequential reaming up to a 53 for 54 cup placed under direct fluoroscopy with 45 degrees abduction and 15/20 of cup flexion, medialized, and inserted.  No overhang secured centralizer hole filled and then the permanent no lip poly was inserted.  Final hydraulic hook was  applied. Leg was taken down and over after excellent rotated to 90 degrees. Capsule was removed posteriorly.  Large trochanteric retractor was placed and then sequential reaming with cookie cutter, peanut, sequential broaching up to 11, which filled the canal well.  There was minimal cancellous bone proximally, however, the #11 size filled the canal with symmetric shape and had excellent stability.  It was 7 mm above the lesser trochanter and trial sizes showed a +5 neck restored leg length.  The permanent Corail #11 stem was inserted followed by the +5 neck.  The hip reduced, checked for stability.  AP and  lateral fluoroscopic views were obtained under fluoroscopic visualization showing both lesser trochanters to make sure leg length had been exactly restored.  The patient's leg would not come out into full extension with correction hip flexion.  No iliopsoas release was required.  Some small venous bleeders coagulated where the hook had been placed which were some branches coming medial in the subtroch region above the lesser trochanter.  Fascia was closed with V-Loc suture, 2-0 Vicryl, and subcutaneous tissue, skin subcuticular closure and Dermabond followed by postop dressing.  Instrument count and needle count was correct.  The patient tolerated the procedure well and was transferred to the recovery room in stable condition.     Shelie Lansing C. Lorin Mercy, M.D.     MCY/MEDQ  D:  10/28/2013  T:  10/29/2013  Job:  [id number]

## 2013-10-29 NOTE — Progress Notes (Signed)
Pt bp 80/40 rt arm manually pt is asymptomatic and requested to sit on bedside commode with rt leg elevated. MD was notified of earlier hypotension and metoprolol 25 mg was held. Will inform day Shift RN Arthor Captain LPN

## 2013-10-29 NOTE — Progress Notes (Signed)
Clinical Social Worker received referral for possible ST-SNF placement. Per CM, patient is going home with home health.  Chart reviewed.  Spoke with RN Case Manager who will follow up with patient to discuss home health needs.    CSW signing off - please re consult if social work needs arise.  Jeanette Caprice, MSW, [place]

## 2013-10-29 NOTE — Progress Notes (Signed)
Utilization review completed.  

## 2013-10-30 LAB — CBC
HCT: 26.3 % — ABNORMAL LOW (ref 36.0–46.0)
Hemoglobin: 8.6 g/dL — ABNORMAL LOW (ref 12.0–15.0)
MCH: 30.4 pg (ref 26.0–34.0)
MCHC: 32.7 g/dL (ref 30.0–36.0)
MCV: 92.9 fL (ref 78.0–100.0)
PLATELETS: 265 10*3/uL (ref 150–400)
RBC: 2.83 MIL/uL — AB (ref 3.87–5.11)
RDW: 13 % (ref 11.5–15.5)
WBC: 7.4 10*3/uL (ref 4.0–10.5)

## 2013-10-30 NOTE — Progress Notes (Signed)
Agree with note. [phone] [address] [place] 10/30/2013 Nestor Lewandowsky, OTR/L Pager: [phone]

## 2013-10-30 NOTE — Progress Notes (Signed)
Physical Therapy Treatment Patient Details Name: MELLA INCLAN MRN: [id number] DOB: January 15, 1951 Today's Date: 10/30/2013    History of Present Illness pt presents post R THA.      PT Comments    Pt continues to make slow progress.  She was able to increase amb distance today to 10' x2, but needs encouragement to attempt amb.  Definitely worth considering ST-SNF for continued rehab if pt would be agreeable.  Will continue to follow.    Follow Up Recommendations  Home health PT;Supervision/Assistance - 24 hour     Equipment Recommendations  Rolling walker with 5" wheels;3in1 (PT)    Recommendations for Other Services       Precautions / Restrictions Precautions Precautions: Fall Precaution Comments: Direct Anterior Restrictions Weight Bearing Restrictions: Yes RLE Weight Bearing: Weight bearing as tolerated    Mobility  Bed Mobility               General bed mobility comments: pt in recliner  Transfers Overall transfer level: Needs assistance Equipment used: Rolling walker (2 wheeled) Transfers: Sit to/from Stand Sit to Stand: Min guard;Min assist         General transfer comment: pt becomes MinA with fatigue.  Repeated transfers x4.    Ambulation/Gait Ambulation/Gait assistance: Min assist Ambulation Distance (Feet): 10 Feet (x2) Assistive device: Rolling walker (2 wheeled) Gait Pattern/deviations: Step-to pattern;Decreased step length - left;Decreased stance time - right;Trunk flexed     General Gait Details: pt able to increased amb distance, though needs encouragement to do so.  pt continues to remain very flexed and fatigues quickly.     Stairs            Wheelchair Mobility    Modified Rankin (Stroke Patients Only)       Balance Overall balance assessment: Needs assistance Sitting-balance support: Single extremity supported Sitting balance-Leahy Scale: Fair     Standing balance support: Bilateral upper extremity supported;During  functional activity Standing balance-Leahy Scale: Poor Standing balance comment: relies on RW for balance with heavy anterior lean while walking                    Cognition Arousal/Alertness: Awake/alert Behavior During Therapy: WFL for tasks assessed/performed Overall Cognitive Status: Within Functional Limits for tasks assessed                      Exercises Total Joint Exercises Hip ABduction/ADduction: AAROM;Right;15 reps Long Arc Quad: AROM;Right;15 reps Marching in Standing: AROM;Right;15 reps;Seated    General Comments        Pertinent Vitals/Pain 7/10.  Premedicated.      Home Living                      Prior Function            PT Goals (current goals can now be found in the care plan section) Acute Rehab PT Goals Patient Stated Goal: walk better Time For Goal Achievement: 11/05/13 Potential to Achieve Goals: Good Progress towards PT goals: Progressing toward goals    Frequency  7X/week    PT Plan Current plan remains appropriate    Co-evaluation             End of Session Equipment Utilized During Treatment: Gait belt Activity Tolerance: Patient tolerated treatment well Patient left: in chair;with call bell/phone within reach     Time: 1112-1139 PT Time Calculation (min): 27 min  Charges:  $Gait Training: 8-22 mins $  Therapeutic Exercise: 8-22 mins                    G CodesCatarina Hartshorn, [place] [phone] 10/30/2013, 11:45 AM

## 2013-10-30 NOTE — Progress Notes (Signed)
Physical Therapy Note   10/29/13 1500  PT Visit Information  Last PT Received On 10/30/13  Assistance Needed +1  History of Present Illness pt presents post R THA.    PT Time Calculation  PT Start Time 1326  PT Stop Time 1359  PT Time Calculation (min) 33 min  Subjective Data  Patient Stated Goal walk better  Precautions  Precautions Fall  Precaution Comments Direct Anterior  Restrictions  Weight Bearing Restrictions Yes  RLE Weight Bearing WBAT  Cognition  Arousal/Alertness Awake/alert;Lethargic  Behavior During Therapy WFL for tasks assessed/performed  Overall Cognitive Status Within Functional Limits for tasks assessed  Bed Mobility  General bed mobility comments pt in recliner  Transfers  Overall transfer level Needs assistance  Equipment used Rolling walker (2 wheeled)  Transfers Sit to/from Stand  Sit to Stand Min assist  General transfer comment pt required less A than this am to come to stand.    Ambulation/Gait  Ambulation/Gait assistance Min assist  Ambulation Distance (Feet) 5 Feet  Assistive device Rolling walker (2 wheeled)  Gait Pattern/deviations Step-to pattern;Decreased step length - left;Decreased stance time - right;Trunk flexed  General Gait Details pt with very flexed posture and states this is normal for her.  Max encouragement needed to amb.    Exercises  Exercises Total Joint  Total Joint Exercises  Long Arc Quad AROM;Right;10 reps  Marching in Standing AROM;Right;10 reps;Seated  PT - End of Session  Equipment Utilized During Treatment Gait belt  Activity Tolerance Patient tolerated treatment well  Patient left in chair;with call bell/phone within reach  Nurse Communication Mobility status  PT - Assessment/Plan  PT Plan Current plan remains appropriate  PT Frequency 7X/week  Follow Up Recommendations Home health PT;Supervision/Assistance - 24 hour  PT equipment Rolling walker with 5" wheels;3in1 (PT)  PT Goal Progression  Progress towards  PT goals Progressing toward goals  Acute Rehab PT Goals  Time For Goal Achievement 11/05/13  Potential to Achieve Goals Good  PT General Charges  $$ ACUTE PT VISIT 1 Procedure  PT Treatments  $Gait Training 8-22 mins  $Therapeutic Exercise 8-22 mins   [employer], [place]

## 2013-10-30 NOTE — Progress Notes (Signed)
Occupational Therapy Treatment and Discharge Patient Details Name: Hannah Scott MRN: [id number] DOB: [birth year] Today's Date: 10/30/2013    History of present illness pt presents post R THA.     OT comments  Focus of today's session was on practicing and tub transfer and reviewing LB dressing/bathing using AE. Reviewed  LB dressing/bathing sequence and technique, proper tub transfer technique using the tub bench and had pt demonstrate knowledge of use of the AE hip kit. Pt will have 24 hour supervision at home, has all DME and AE needs, and feels confident in self care tasks. No further OT is needed, we will sign off.  Follow Up Recommendations  Supervision/Assistance - 24 hour;No OT follow up    Equipment Recommendations  None recommended by OT;Other (comment) (pt has equipment recommended)       Precautions / Restrictions Precautions Precautions: Fall Precaution Comments: Direct Anterior Restrictions Weight Bearing Restrictions: Yes RLE Weight Bearing: Weight bearing as tolerated       Mobility Bed Mobility               General bed mobility comments: pt in recliner  Transfers Overall transfer level: Needs assistance Equipment used: Rolling walker (2 wheeled) Transfers: Sit to/from Stand Sit to Stand: Min guard         General transfer comment: with increased time    Balance Overall balance assessment: Needs assistance Sitting-balance support: Single extremity supported Sitting balance-Leahy Scale: Fair     Standing balance support: Bilateral upper extremity supported;During functional activity Standing balance-Leahy Scale: Poor Standing balance comment: relies on RW for balance with heavy anterior lean while walking                   ADL Overall ADL's : Needs assistance/impaired             Lower Body Bathing: Sit to/from stand;With adaptive equipment;Min guard       Lower Body Dressing: Sit to/from stand;With adaptive equipment;Minimal  assistance (to pull pants up)           Tub/ Shower Transfer: Rolling walker;Tub bench;Ambulation;Min guard     General ADL Comments: Educated and had pt demonstrate tub transfer using the tub bench. Pt states that she will most likely continue doing a sponge bath until she gets stronger and that she could benefit from a long handled sponge to assist with washing her LB and back. Pt demonstrated knowledge of AE that she has from a previous sx for LB dressing. Pt c/o numbness and tingling in R hand prior to tx, stating that it happens often in both hands.                Cognition   Behavior During Therapy: WFL for tasks assessed/performed Overall Cognitive Status: Within Functional Limits for tasks assessed                                Pertinent Vitals/ Pain       Pt c/o of pain at beginning of tx as 6/10. Pt had just received pain medicine and stated that her pain decreased throughout tx.            Progress Toward Goals  OT Goals(current goals can now be found in the care plan section)  Progress towards OT goals:  (All education completed)  Acute Rehab OT Goals Patient Stated Goal: walk better OT Goal Formulation: With patient Time For Goal  Achievement: 11/05/13 Potential to Achieve Goals: Good  Plan Discharge plan remains appropriate       End of Session Equipment Utilized During Treatment: Gait belt;Rolling walker   Activity Tolerance Patient tolerated treatment well   Patient Left in chair;with call bell/phone within reach           Time:  -     Charges:    Lyda Perone 10/30/2013, 10:51 AM

## 2013-10-30 NOTE — Progress Notes (Signed)
Physical Therapy Note   10/30/13 1400  PT Visit Information  Last PT Received On 10/30/13  Assistance Needed +1  History of Present Illness pt presents post R THA.    PT Time Calculation  PT Start Time 1325  PT Stop Time 1349  PT Time Calculation (min) 24 min  Subjective Data  Patient Stated Goal walk better  Precautions  Precautions Fall  Precaution Comments Direct Anterior  Restrictions  Weight Bearing Restrictions Yes  RLE Weight Bearing WBAT  Cognition  Arousal/Alertness Awake/alert  Behavior During Therapy WFL for tasks assessed/performed  Overall Cognitive Status Within Functional Limits for tasks assessed  Bed Mobility  General bed mobility comments pt in recliner  Transfers  Overall transfer level Needs assistance  Equipment used Rolling walker (2 wheeled)  Sit to Stand Min guard;Min assist  General transfer comment pt becomes MinA with fatigue.  Repeated transfers x4.    Ambulation/Gait  Ambulation/Gait assistance Min assist  Ambulation Distance (Feet) 15 Feet (x2)  Assistive device Rolling walker (2 wheeled)  Gait Pattern/deviations Step-to pattern;Decreased step length - left;Decreased stance time - right;Trunk flexed  General Gait Details pt able to increase amb distance this pm.  Needs seated rest break in between bouts of gait.  pt remains with flexed posture relying heavily on RW.    PT - End of Session  Equipment Utilized During Treatment Gait belt  Activity Tolerance Patient tolerated treatment well  Patient left in chair;with call bell/phone within reach  Nurse Communication Mobility status  PT - Assessment/Plan  PT Plan Current plan remains appropriate  PT Frequency 7X/week  Follow Up Recommendations Home health PT;Supervision/Assistance - 24 hour  PT equipment Rolling walker with 5" wheels;3in1 (PT)  PT Goal Progression  Progress towards PT goals Progressing toward goals  Acute Rehab PT Goals  Time For Goal Achievement 11/05/13  Potential to  Achieve Goals Good  PT General Charges  $$ ACUTE PT VISIT 1 Procedure  PT Treatments  $Gait Training 23-37 mins   [place], [place] [phone]

## 2013-10-30 NOTE — Progress Notes (Signed)
Subjective: 2 Days Post-Op Procedure(s) (LRB): TOTAL HIP ARTHROPLASTY ANTERIOR APPROACH (Right) Patient reports pain as moderate.    Objective: Vital signs in last 24 hours: Temp:  [98.2 F (36.8 C)-99.6 F (37.6 C)] 98.6 F (37 C) (07/08 0646) Pulse Rate:  [102-119] 103 (07/08 0646) Resp:  [16-18] 16 (07/08 0646) BP: (96-120)/(36-69) 113/44 mmHg (07/08 0646) SpO2:  [91 %-100 %] 96 % (07/08 0646)  Intake/Output from previous day: 07/07 0701 - 07/08 0700 In: 1440 [P.O.:1440] Out: -  Intake/Output this shift:     Recent Labs  10/29/13 0555 10/30/13 0550  HGB 9.9* 8.6*    Recent Labs  10/29/13 0555 10/30/13 0550  WBC 8.3 7.4  RBC 3.24* 2.83*  HCT 31.3* 26.3*  PLT 346 265    Recent Labs  10/29/13 0555  NA 135*  K 4.6  CL 98  CO2 22  BUN 16  CREATININE 1.03  GLUCOSE 133*  CALCIUM 8.3*   No results found for this basename: LABPT, INR,  in the last 72 hours  Neurologically intact  Assessment/Plan: 2 Days Post-Op Procedure(s) (LRB): TOTAL HIP ARTHROPLASTY ANTERIOR APPROACH (Right) Up with therapy  Is moving slow due to lack of ambulation before surgery.  Hgb 8.6.    Continue therapy, not likely home today.   Ardith Lewman C 10/30/2013, 7:51 AM

## 2013-10-31 LAB — CBC
HEMATOCRIT: 25.3 % — AB (ref 36.0–46.0)
Hemoglobin: 8.2 g/dL — ABNORMAL LOW (ref 12.0–15.0)
MCH: 30.3 pg (ref 26.0–34.0)
MCHC: 32.4 g/dL (ref 30.0–36.0)
MCV: 93.4 fL (ref 78.0–100.0)
PLATELETS: 289 10*3/uL (ref 150–400)
RBC: 2.71 MIL/uL — ABNORMAL LOW (ref 3.87–5.11)
RDW: 13.1 % (ref 11.5–15.5)
WBC: 8.4 10*3/uL (ref 4.0–10.5)

## 2013-10-31 NOTE — Progress Notes (Signed)
Subjective: 3 Days Post-Op Procedure(s) (LRB): TOTAL HIP ARTHROPLASTY ANTERIOR APPROACH (Right) Patient reports pain as 3 on 0-10 scale.    Objective: Vital signs in last 24 hours: Temp:  [98.3 F (36.8 C)-99.4 F (37.4 C)] 98.3 F (36.8 C) (07/09 0620) Pulse Rate:  [88-97] 89 (07/09 0620) Resp:  [16-17] 16 (07/09 0620) BP: (108-125)/(46-52) 125/50 mmHg (07/09 0620) SpO2:  [91 %-99 %] 96 % (07/09 0620)  Intake/Output from previous day: 07/08 0701 - 07/09 0700 In: 600 [P.O.:600] Out: -  Intake/Output this shift:     Recent Labs  10/29/13 0555 10/30/13 0550 10/31/13 0352  HGB 9.9* 8.6* 8.2*    Recent Labs  10/30/13 0550 10/31/13 0352  WBC 7.4 8.4  RBC 2.83* 2.71*  HCT 26.3* 25.3*  PLT 265 289    Recent Labs  10/29/13 0555  NA 135*  K 4.6  CL 98  CO2 22  BUN 16  CREATININE 1.03  GLUCOSE 133*  CALCIUM 8.3*   No results found for this basename: LABPT, INR,  in the last 72 hours  Neurologically intact  Assessment/Plan: 3 Days Post-Op Procedure(s) (LRB): TOTAL HIP ARTHROPLASTY ANTERIOR APPROACH (Right) Discharge home with home health   Acute blood loss anemia.   Hannah Scott C 10/31/2013, 7:45 AM

## 2013-10-31 NOTE — Progress Notes (Signed)
Pt, per rept, c/o numbness and noted to have a moderate to large amount of bruising to right hip posterior and upper yesterday. Pt denies a change in the numbness in her hip and bruising remains about the "same". Per rept, all repted to MD on rounds yesterday. Pt also c/o R hand numbness that pt states was present PTA due to her history of kyphosis and numbness hasn't "changed' since admission. Will continue to monitor.

## 2013-10-31 NOTE — Progress Notes (Signed)
Pt BP 97/33 pulse 97. Pt denies any adverse s/sx related to standing and low BP. Rept to Dr. Lorin Mercy and per his order will  hold pt's Lotensin, HCTZ and Lopressor today.. Will have pt check her BP at home and will proceed with discharge.

## 2013-10-31 NOTE — Progress Notes (Signed)
Physical Therapy Treatment Patient Details Name: Hannah Scott MRN: [id number] DOB: 10/23/1950 Today's Date: 10/31/2013    History of Present Illness pt presents post R THA.      PT Comments    Pt indicates she will have 24hr care from family at home and has a ramp to enter home.  Pt indicates plan is for D/C home today.  Will continue to follow if remains on acute.    Follow Up Recommendations  Home health PT;Supervision/Assistance - 24 hour     Equipment Recommendations  Rolling walker with 5" wheels;3in1 (PT)    Recommendations for Other Services       Precautions / Restrictions Precautions Precautions: Fall Precaution Comments: Direct Anterior Restrictions Weight Bearing Restrictions: Yes RLE Weight Bearing: Weight bearing as tolerated    Mobility  Bed Mobility               General bed mobility comments: pt in recliner  Transfers Overall transfer level: Needs assistance Equipment used: Rolling walker (2 wheeled) Transfers: Sit to/from Stand Sit to Stand: Min guard         General transfer comment: pt demos good use of UEs.  Repeated transfers x3  Ambulation/Gait Ambulation/Gait assistance: Min assist Ambulation Distance (Feet): 15 Feet (x2) Assistive device: Rolling walker (2 wheeled) Gait Pattern/deviations: Step-to pattern;Decreased step length - left;Decreased stance time - right;Trunk flexed     General Gait Details: pt indicates increased fatigue today limiting mobility.     Stairs            Wheelchair Mobility    Modified Rankin (Stroke Patients Only)       Balance                                    Cognition Arousal/Alertness: Awake/alert Behavior During Therapy: WFL for tasks assessed/performed Overall Cognitive Status: Within Functional Limits for tasks assessed                      Exercises      General Comments        Pertinent Vitals/Pain 7/10 during mobility.  Premedicated.       Home Living                      Prior Function            PT Goals (current goals can now be found in the care plan section) Acute Rehab PT Goals Patient Stated Goal: walk better Time For Goal Achievement: 11/05/13 Potential to Achieve Goals: Good Progress towards PT goals: Progressing toward goals    Frequency  7X/week    PT Plan Current plan remains appropriate    Co-evaluation             End of Session Equipment Utilized During Treatment: Gait belt Activity Tolerance: Patient tolerated treatment well Patient left: in chair;with call bell/phone within reach     Time: 0825-0852 PT Time Calculation (min): 27 min  Charges:  $Gait Training: 23-37 mins                    G CodesCatarina Scott, [place] 10/31/2013, 8:56 AM

## 2013-10-31 NOTE — Care Management Note (Signed)
CARE MANAGEMENT NOTE 10/31/2013  Patient:  Hannah Scott, Hannah Scott   Account Number:  [id number]  Date Initiated:  10/29/2013  Documentation initiated by:  Ricki Miller  Subjective/Objective Assessment:   63 yr old female s/p right total hip arthroplasty.     Action/Plan:   Case manager spoke with patient concerning home health and DME needs. She has rolling walker and 3in1. Will wait for PT eval to determine HH needs.   Anticipated DC Date:  10/30/2013   Anticipated DC Plan:  [place]  CM consult      Choice offered to / List presented to:  C-1 Patient           [place].   Status of service:  Completed, signed off Medicare Important Message given?  NA - LOS <3 / Initial given by admissions (If response is "NO", the following Medicare IM given date fields will be blank) Date Medicare IM given:   Medicare IM given by:   Date Additional Medicare IM given:   Additional Medicare IM given by:    Discharge Disposition:  [place]  Per UR Regulation:  Reviewed for med. necessity/level of care/duration of stay

## 2013-11-04 ENCOUNTER — Encounter: Payer: Medicare Other | Admitting: Registered Nurse

## 2013-11-18 NOTE — Discharge Summary (Signed)
Physician Discharge Summary  Patient ID: Hannah Scott MRN: [id number] DOB/AGE: [birth year] 63 y.o.  Admit date: 10/28/2013 Discharge date: 10/31/2013  Admission Diagnoses:  Osteoarthritis of right hip  Discharge Diagnoses:  Principal Problem:   Osteoarthritis of right hip Active Problems:   Postoperative anemia due to acute blood loss   Past Medical History  Diagnosis Date  . Asthma     PFTs, February, 2011, moderate obstructive disease with response to bronchodilators, normal lung volumes, moderate reduction in diffusing capacity  . Obstructive airway disease   . Hyperlipidemia   . Hypertension   . Hypothyroidism     Patient has had in the past that she does not need treatment  . CAD (coronary artery disease)     90% distal LAD in the past  /   nuclear, 2008, no ischemia, ejection fraction 70%  . Atrial septal aneurysm     Echo, 2008-not noted on 13 echo  . Pinched nerve     lower back  . Kyphoscoliosis   . Shortness of breath   . Endometriosis 1989    RIGHT TUBE  . Endometriosis 1987    LEFT TUBE/OVARY W FOCAL IN-SITU ENDOMETRIAL ADENOCARCINOMA  . Cancer 2002    DUCTAL CIS--S/P LUMPECTOMY, RADIATION AND 6 WEEKS OF TAMOXIFEN  . Ejection fraction     EF 60%, echo, October, 2008  . Elevated CPK     January, 2014  . D-dimer, elevated     January, 2014  . Depression   . UTI (lower urinary tract infection)   . GERD (gastroesophageal reflux disease)     occ  . H/O hiatal hernia     ?  [name] Arthritis   . Anemia     hx    Surgeries: Procedure(s): RIGHT TOTAL HIP ARTHROPLASTY ANTERIOR APPROACH on 10/28/2013   Consultants (if any):  NONE  Discharged Condition: Improved  Hospital Course: Hannah Scott is an 63 y.o. female who was admitted 10/28/2013 with a diagnosis of Osteoarthritis of right hip and went to the operating room on 10/28/2013 and underwent the above named procedures.    She was given perioperative antibiotics:      Anti-infectives   Start     Dose/Rate Route  Frequency Ordered Stop   10/28/13 0600  ceFAZolin (ANCEF) IVPB 2 g/50 mL premix     2 g 100 mL/hr over 30 Minutes Intravenous On call to O.R. 10/27/13 1918 10/28/13 1243    Pt slow with activity due to deconditioning pre op.  HTN meds held secondary to low BP which was stable at discharge. ABL anemia treated with oral iron supplement.  She was given sequential compression devices, early ambulation, and aspirin for DVT prophylaxis.  She benefited maximally from the hospital stay and there were no complications.    Recent vital signs:  Filed Vitals:   10/31/13 1037  BP: 97/33  Pulse: 97  Temp:   Resp:     Recent laboratory studies:  Lab Results  Component Value Date   HGB 8.2* 10/31/2013   HGB 8.6* 10/30/2013   HGB 9.9* 10/29/2013   Lab Results  Component Value Date   WBC 8.4 10/31/2013   PLT 289 10/31/2013   Lab Results  Component Value Date   INR 0.98 10/23/2013   Lab Results  Component Value Date   NA 135* 10/29/2013   K 4.6 10/29/2013   CL 98 10/29/2013   CO2 22 10/29/2013   BUN 16 10/29/2013   CREATININE 1.03 10/29/2013  GLUCOSE 133* 10/29/2013    Discharge Medications:     Medication List    STOP taking these medications       HYDROcodone-acetaminophen 10-325 MG per tablet  Commonly known as:  NORCO      TAKE these medications       albuterol 108 (90 BASE) MCG/ACT inhaler  Commonly known as:  PROVENTIL HFA;VENTOLIN HFA  Inhale 1-2 puffs into the lungs every 6 (six) hours as needed for wheezing or shortness of breath.     albuterol 108 (90 BASE) MCG/ACT inhaler  Commonly known as:  PROAIR HFA  Inhale 1-2 puffs into the lungs every 6 (six) hours as needed.     aspirin EC 325 MG tablet  Take 1 tablet (325 mg total) by mouth daily.     atorvastatin 40 MG tablet  Commonly known as:  LIPITOR  Take 1 tablet (40 mg total) by mouth at bedtime.     benazepril 20 MG tablet  Commonly known as:  LOTENSIN  Take 20 mg by mouth daily.     hydrochlorothiazide 12.5 MG capsule   Commonly known as:  MICROZIDE  Take 1 capsule (12.5 mg total) by mouth daily.     ipratropium-albuterol 0.5-2.5 (3) MG/3ML Soln  Commonly known as:  DUONEB  Take 3 mLs by nebulization every 4 (four) hours as needed. Must not be qid unless during exacerbation. Dx 493.90     levothyroxine 75 MCG tablet  Commonly known as:  SYNTHROID, LEVOTHROID  Take 1 tablet (75 mcg total) by mouth daily before breakfast.     methocarbamol 500 MG tablet  Commonly known as:  ROBAXIN  Take 1 tablet (500 mg total) by mouth every 6 (six) hours as needed for muscle spasms (spasm).     metoprolol tartrate 25 MG tablet  Commonly known as:  LOPRESSOR  Take 25 mg by mouth 2 (two) times daily.     mometasone-formoterol 200-5 MCG/ACT Aero  Commonly known as:  DULERA  Inhale 2 puffs into the lungs 2 (two) times daily.     morphine 30 MG 12 hr tablet  Commonly known as:  MS CONTIN  Take 1 tablet (30 mg total) by mouth at bedtime.     oxyCODONE-acetaminophen 5-325 MG per tablet  Commonly known as:  ROXICET  Take 1-2 tablets by mouth every 4 (four) hours as needed for severe pain.     sertraline 50 MG tablet  Commonly known as:  ZOLOFT  Take 50 mg by mouth at bedtime.     traZODone 50 MG tablet  Commonly known as:  DESYREL  Take 25 mg by mouth at bedtime.        Diagnostic Studies: Dg Chest 2 View  10/23/2013   CLINICAL DATA:  Preop hip surgery  EXAM: CHEST  2 VIEW  COMPARISON:  05/02/2012  FINDINGS: Eventration right hemidiaphragm unchanged. Right lower lobe atelectasis unchanged. Improvement in left lower lobe atelectasis. Moderate hiatal hernia unchanged  Negative for heart failure or edema.  No pleural effusion.  IMPRESSION: Bibasilar atelectasis, with some improvement on the left.  Hiatal hernia.   Electronically Signed   By: Franchot Gallo M.D.   On: 10/23/2013 16:13   Dg Hip Operative Right  10/28/2013   CLINICAL DATA:  Status post right total hip arthroplasty, anterior approach  EXAM: DG  OPERATIVE RIGHT HIP  TECHNIQUE: A single spot fluoroscopic AP image of the right hip is submitted.  COMPARISON:  Right hip radiographs 10/31/2008  FINDINGS: A single  intraoperative view of the right hip is submitted. The patient is status post right total hip arthroplasty. The hip appears located.  IMPRESSION: Status post right total hip arthroplasty without radiographic evidence for complication.   Electronically Signed   By: Lawrence Santiago M.D.   On: 10/28/2013 14:53    Disposition: 06-Home-Health Care Svc  DISCHARGE INSTRUCTIONS: Keep hip incision dry for 5 days post op then may wet while bathing. Therapy daily . Call if fever or chills or increased drainage. Go to ER if acutely short of breath or call for ambulance. Return for follow up in 2 weeks. May full weight bear on the surgical leg unless told otherwise. In house walking for first 2 weeks.  Ice packs as needed for pain and swelling Discharge Instructions   Full weight bearing    Complete by:  As directed   Laterality:  right  Extremity:  Lower           Follow-up Information   Follow up with YATES,MARK C, MD. Schedule an appointment as soon as possible for a visit in 2 weeks.   Specialty:  Orthopedic Surgery   Contact information:   [place] [address] [phone]        Signed: Epimenio Foot 11/18/2013, 3:38 PM

## 2013-11-27 ENCOUNTER — Ambulatory Visit (INDEPENDENT_AMBULATORY_CARE_PROVIDER_SITE_OTHER): Payer: Medicare Other | Admitting: Cardiology

## 2013-11-27 ENCOUNTER — Encounter: Payer: Self-pay | Admitting: Cardiology

## 2013-11-27 VITALS — BP 128/78 | HR 100 | Ht 60.0 in | Wt 167.0 lb

## 2013-11-27 DIAGNOSIS — R609 Edema, unspecified: Secondary | ICD-10-CM | POA: Insufficient documentation

## 2013-11-27 DIAGNOSIS — E785 Hyperlipidemia, unspecified: Secondary | ICD-10-CM

## 2013-11-27 DIAGNOSIS — D500 Iron deficiency anemia secondary to blood loss (chronic): Secondary | ICD-10-CM | POA: Insufficient documentation

## 2013-11-27 DIAGNOSIS — I10 Essential (primary) hypertension: Secondary | ICD-10-CM

## 2013-11-27 DIAGNOSIS — I2510 Atherosclerotic heart disease of native coronary artery without angina pectoris: Secondary | ICD-10-CM

## 2013-11-27 NOTE — Patient Instructions (Signed)
Your physician recommends that you continue on your current medications as directed. Please refer to the Current Medication list given to you today.  Your physician wants you to follow-up in: 1 year. You will receive a reminder letter in the mail two months in advance. If you don't receive a letter, please call our office to schedule the follow-up appointment.  Please contact your primary care MD concerning your anemia and possible Iron therapy.

## 2013-11-27 NOTE — Assessment & Plan Note (Signed)
The patient's last hemoglobin was 8.2 after her hip surgery. There was mention of transfusion. But the patient did not want this. She does not appear to be on iron at this time. I've encouraged her to followup with her primary care team to see about iron treatment and followup of her anemia. The evaluation of this problem is a new evaluation for me.

## 2013-11-27 NOTE — Progress Notes (Signed)
Patient ID: Hannah Scott, female   DOB: March 28, 1951, 63 y.o.   MRN: [id number]    HPI  Patient is seen today to followup coronary disease. She has been stable. She has nuclear stress test in December, 2014. There was a small area of ischemia. She has known distal LAD disease. We were using amlodipine around that time for a question of angina.  The patient had hip replacement. After that time she had some edema. Her amlodipine was stopped and she was treated with a diuretic for a period of time. Her edema improved and she is now stable without her diuretic and without amlodipine. Her blood pressure stable.  As part of today's evaluation I have reviewed the patient's hospital records concerning her hip surgery.  Allergies  Allergen Reactions  . Tape     Prefers paper tape  . Fluticasone-Salmeterol     REACTION: headache.  She is tolerating Dulera well.    Current Outpatient Prescriptions  Medication Sig Dispense Refill  . albuterol (PROAIR HFA) 108 (90 BASE) MCG/ACT inhaler Inhale 1-2 puffs into the lungs every 6 (six) hours as needed.  1 Inhaler  6  . albuterol (PROVENTIL HFA;VENTOLIN HFA) 108 (90 BASE) MCG/ACT inhaler Inhale 1-2 puffs into the lungs every 6 (six) hours as needed for wheezing or shortness of breath.      [name] aspirin EC 325 MG tablet Take 1 tablet (325 mg total) by mouth daily.  30 tablet  0  . atorvastatin (LIPITOR) 40 MG tablet Take 1 tablet (40 mg total) by mouth at bedtime.  90 tablet  2  . benazepril (LOTENSIN) 20 MG tablet Take 20 mg by mouth daily.      [name] HYDROcodone-acetaminophen (NORCO) 10-325 MG per tablet Take 1 tablet by mouth every 6 (six) hours as needed.      [name] ipratropium-albuterol (DUONEB) 0.5-2.5 (3) MG/3ML SOLN Take 3 mLs by nebulization every 4 (four) hours as needed. Must not be qid unless during exacerbation. Dx 493.90  360 mL  0  . levothyroxine (SYNTHROID, LEVOTHROID) 75 MCG tablet Take 1 tablet (75 mcg total) by mouth daily before breakfast.  30 tablet   5  . metoprolol tartrate (LOPRESSOR) 25 MG tablet Take 25 mg by mouth 2 (two) times daily.      . mometasone-formoterol (DULERA) 200-5 MCG/ACT AERO Inhale 2 puffs into the lungs 2 (two) times daily.  8.8 g  5  . sertraline (ZOLOFT) 50 MG tablet Take 50 mg by mouth at bedtime.      . traZODone (DESYREL) 50 MG tablet Take 25 mg by mouth at bedtime.       No current facility-administered medications for this visit.    History   Social History  . Marital Status: Married    Spouse Name: N/A    Number of Children: N/A  . Years of Education: N/A   Occupational History  . Not on file.   Social History Main Topics  . Smoking status: Never Smoker   . Smokeless tobacco: Never Used  . Alcohol Use: No  . Drug Use: No  . Sexual Activity: No   Other Topics Concern  . Not on file   Social History Narrative  . No narrative on file    Family History  Problem Relation Age of Onset  . Heart attack Father   . Heart disease Father   . Heart failure Mother   . Pneumonia Mother   . Hypertension Mother   . Heart disease Mother   .  Stroke Maternal Grandfather   . Hypertension Maternal Grandfather   . Asthma Paternal Uncle     PAT UNCLES  . Heart attack Paternal Uncle     Past Medical History  Diagnosis Date  . Asthma     PFTs, February, 2011, moderate obstructive disease with response to bronchodilators, normal lung volumes, moderate reduction in diffusing capacity  . Obstructive airway disease   . Hyperlipidemia   . Hypertension   . Hypothyroidism     Patient has had in the past that she does not need treatment  . CAD (coronary artery disease)     90% distal LAD in the past  /   nuclear, 2008, no ischemia, ejection fraction 70%  . Atrial septal aneurysm     Echo, 2008-not noted on 13 echo  . Pinched nerve     lower back  . Kyphoscoliosis   . Shortness of breath   . Endometriosis 1989    RIGHT TUBE  . Endometriosis 1987    LEFT TUBE/OVARY W FOCAL IN-SITU ENDOMETRIAL  ADENOCARCINOMA  . Cancer 2002    DUCTAL CIS--S/P LUMPECTOMY, RADIATION AND 6 WEEKS OF TAMOXIFEN  . Ejection fraction     EF 60%, echo, October, 2008  . Elevated CPK     January, 2014  . D-dimer, elevated     January, 2014  . Depression   . UTI (lower urinary tract infection)   . GERD (gastroesophageal reflux disease)     occ  . H/O hiatal hernia     ?  [name] Arthritis   . Anemia     hx    Past Surgical History  Procedure Laterality Date  . Cataract extraction Bilateral 01,03  . Cardiovascular stress test  09/07/05    Nuclear, was negative  . Breast lumpectomy  2003    radiation on right  . Appendectomy  1987    AT TAH  . Total abdominal hysterectomy  1987    LSO, APPENDECTOMY  . Pelvic laparoscopy  1989    RSO, LYSIS OF ADHESIONS  . Oophorectomy      LSO -RSO  . Back surgery      Fusion  . Ankle surgery Left     ligament  . Total hip arthroplasty Right 10/28/2013    dr Lorin Mercy  . Total hip arthroplasty Right 10/28/2013    Procedure: TOTAL HIP ARTHROPLASTY ANTERIOR APPROACH;  Surgeon: Marybelle Killings, MD;  Location: [facility];  Service: Orthopedics;  Laterality: Right;  Right Total Hip Arthroplasty, Direct Anterior Approach    Patient Active Problem List   Diagnosis Date Noted  . Postoperative anemia due to acute blood loss 10/31/2013  . Other postablative hypothyroidism 07/02/2013  . Elevated CPK   . D-dimer, elevated   . Multinodular goiter 12/01/2011  . Asthma   . Hypothyroidism   . CAD (coronary artery disease)   . Ejection fraction   . Atrial septal aneurysm   . Hyperlipidemia   . Hypertension   . Shortness of breath   . Scoliosis   . Spinal stenosis, lumbar region, with neurogenic claudication 07/01/2011  . Lumbar postlaminectomy syndrome 07/01/2011  . Osteoarthritis of right hip 07/01/2011  . Contracture of right hip 07/01/2011  . Endometriosis   . DEPRESSION 12/24/2009  . FATIGUE 12/24/2009  . ESOPHAGEAL REFLUX 04/09/2007  . MIGRAINES, HX OF 05/19/2006     ROS   Patient denies fever, chills, headache, sweats, rash, change in vision, change in hearing, chest pain, cough, nausea or vomiting, urinary  symptoms. All other systems are reviewed and are negative.  PHYSICAL EXAM  Patient is stable. She is walking with a rolling walker. She has kyphosis of the thoracic spine. She is oriented to person time and place. Affect is normal. Head is atraumatic. Sclera and conjunctiva are normal. There is no jugulovenous distention. Lungs are clear. Respiratory effort is nonlabored. Cardiac exam reveals S1 and S2. There no clicks or significant murmurs. Abdomen is soft. There is no peripheral edema today.  Filed Vitals:   11/27/13 0820  BP: 128/78  Pulse: 100  Height: 5' (1.524 m)  Weight: 167 lb (75.751 kg)   I reviewed a preoperative EKG from the hospital in July, 2015. There was normal sinus rhythm and no significant abnormality.  ASSESSMENT & PLAN

## 2013-11-27 NOTE — Assessment & Plan Note (Signed)
Blood pressure is controlled. No change in therapy. 

## 2013-11-27 NOTE — Assessment & Plan Note (Addendum)
Coronary disease is stable. We had used amlodipine at one point for the possibility of mild angina. She is off this medicine and not having any symptoms. No further workup.  As part of today's evaluation I spent greater than 25 minutes with her total care. More than half of this time has been spent directly with the patient. We reviewed all aspects of his cardiac status and her status after surgery for her hip. I also help to evaluate her anemia.

## 2013-11-27 NOTE — Assessment & Plan Note (Signed)
The patient's edema improved after diuresis over time. She does not have any further edema at this time.

## 2013-11-27 NOTE — Assessment & Plan Note (Signed)
She is receiving guidelines directed medical therapy.

## 2013-11-28 ENCOUNTER — Other Ambulatory Visit: Payer: Self-pay

## 2013-11-28 MED ORDER — BENAZEPRIL HCL 20 MG PO TABS: 20.0000 mg | ORAL_TABLET | Freq: Every day | ORAL | Status: DC

## 2013-12-18 ENCOUNTER — Other Ambulatory Visit: Payer: Self-pay

## 2013-12-18 MED ORDER — TRAZODONE HCL 50 MG PO TABS: 25.0000 mg | ORAL_TABLET | Freq: Every day | ORAL | Status: DC

## 2013-12-18 MED ORDER — MOMETASONE FURO-FORMOTEROL FUM 200-5 MCG/ACT IN AERO: 2.0000 | INHALATION_SPRAY | Freq: Two times a day (BID) | RESPIRATORY_TRACT | Status: DC

## 2013-12-23 ENCOUNTER — Telehealth: Payer: Self-pay | Admitting: Internal Medicine

## 2013-12-23 NOTE — Telephone Encounter (Signed)
Received call from [facility]. They would like to know if you want the patient to have a screening or diagnostic mammogram. She was to have follow-up mammogram 6 months from 03/2013. Please advise.

## 2013-12-23 NOTE — Telephone Encounter (Signed)
[facility] has been made aware. Thank you!

## 2013-12-23 NOTE — Telephone Encounter (Signed)
Diagnostic if 6 month F/U

## 2014-01-02 ENCOUNTER — Encounter: Payer: Self-pay | Admitting: Internal Medicine

## 2014-01-03 ENCOUNTER — Encounter: Payer: Self-pay | Admitting: Endocrinology

## 2014-01-03 ENCOUNTER — Ambulatory Visit (INDEPENDENT_AMBULATORY_CARE_PROVIDER_SITE_OTHER): Payer: Medicare Other | Admitting: Endocrinology

## 2014-01-03 VITALS — BP 142/98 | HR 82 | Temp 98.2°F | Ht 60.0 in | Wt 164.0 lb

## 2014-01-03 DIAGNOSIS — E039 Hypothyroidism, unspecified: Secondary | ICD-10-CM

## 2014-01-03 DIAGNOSIS — D500 Iron deficiency anemia secondary to blood loss (chronic): Secondary | ICD-10-CM

## 2014-01-03 DIAGNOSIS — Z23 Encounter for immunization: Secondary | ICD-10-CM

## 2014-01-03 DIAGNOSIS — E890 Postprocedural hypothyroidism: Secondary | ICD-10-CM

## 2014-01-03 LAB — CBC WITH DIFFERENTIAL/PLATELET
BASOS ABS: 0 10*3/uL (ref 0.0–0.1)
BASOS PCT: 0 % (ref 0–1)
EOS PCT: 8 % — AB (ref 0–5)
Eosinophils Absolute: 0.5 10*3/uL (ref 0.0–0.7)
HEMATOCRIT: 34 % — AB (ref 36.0–46.0)
Hemoglobin: 10.8 g/dL — ABNORMAL LOW (ref 12.0–15.0)
Lymphocytes Relative: 21 % (ref 12–46)
Lymphs Abs: 1.2 10*3/uL (ref 0.7–4.0)
MCH: 25 pg — ABNORMAL LOW (ref 26.0–34.0)
MCHC: 31.8 g/dL (ref 30.0–36.0)
MCV: 78.7 fL (ref 78.0–100.0)
Monocytes Absolute: 0.6 10*3/uL (ref 0.1–1.0)
Monocytes Relative: 11 % (ref 3–12)
NEUTROS ABS: 3.5 10*3/uL (ref 1.7–7.7)
Neutrophils Relative %: 60 % (ref 43–77)
PLATELETS: 386 10*3/uL (ref 150–400)
RBC: 4.32 MIL/uL (ref 3.87–5.11)
RDW: 16.3 % — AB (ref 11.5–15.5)
WBC: 5.8 10*3/uL (ref 4.0–10.5)

## 2014-01-03 LAB — IBC PANEL
%SAT: 5 % — ABNORMAL LOW (ref 20–55)
TIBC: 425 ug/dL (ref 250–470)
UIBC: 404 ug/dL — ABNORMAL HIGH (ref 125–400)

## 2014-01-03 LAB — IRON: Iron: 21 ug/dL — ABNORMAL LOW (ref 42–145)

## 2014-01-03 LAB — TSH: TSH: 2.467 u[IU]/mL (ref 0.350–4.500)

## 2014-01-03 NOTE — Patient Instructions (Addendum)
blood tests are being requested for you today.  We'll contact you with results. Please come back for a follow-up appointment in 2 months.    Most of the time, your thyroid will start working more or less than it is now.  This means that your need for the levothyroxine will go up or down with time, bult seldom stays the same.

## 2014-01-03 NOTE — Progress Notes (Signed)
Subjective:    Patient ID: Hannah Scott, female    DOB: 1950/04/26, 63 y.o.   MRN: [id number]  HPI The state of at least three ongoing medical problems is addressed today, with interval history of each noted here: Pt returns for f/u of post-I-131 hypothyroidism (she had i-131 rx for hyperthyroidism, due to multinodular goiter, in September, 2013; in early 2014, TSH was high, and synthroid was started; she continues to have depression, due to the recent death of her husband.  In Sep 27, 2013, synthroid was increased back up to 75 mcg/day.  She had severe anemia after surgery 2 mos ago.  She does not take fe tabs.  Denies BRBPR.   Mild hypocalcemia was noted in the hospital 2 mos ago.  Pt reports leg cramps.   Past Medical History  Diagnosis Date  . Asthma     PFTs, February, 2011, moderate obstructive disease with response to bronchodilators, normal lung volumes, moderate reduction in diffusing capacity  . Obstructive airway disease   . Hyperlipidemia   . Hypertension   . Hypothyroidism     Patient has had in the past that she does not need treatment  . CAD (coronary artery disease)     90% distal LAD in the past  /   nuclear, 2008, no ischemia, ejection fraction 70%  . Atrial septal aneurysm     Echo, 2008-not noted on 13 echo  . Pinched nerve     lower back  . Kyphoscoliosis   . Shortness of breath   . Endometriosis 1989    RIGHT TUBE  . Endometriosis 1987    LEFT TUBE/OVARY W FOCAL IN-SITU ENDOMETRIAL ADENOCARCINOMA  . Cancer 2002    DUCTAL CIS--S/P LUMPECTOMY, RADIATION AND 6 WEEKS OF TAMOXIFEN  . Ejection fraction     EF 60%, echo, October, 2008  . Elevated CPK     January, 2014  . D-dimer, elevated     January, 2014  . Depression   . UTI (lower urinary tract infection)   . GERD (gastroesophageal reflux disease)     occ  . H/O hiatal hernia     ?  [name] Arthritis   . Anemia     hx    Past Surgical History  Procedure Laterality Date  . Cataract extraction Bilateral  01,03  . Cardiovascular stress test  09/07/05    Nuclear, was negative  . Breast lumpectomy  2003    radiation on right  . Appendectomy  1987    AT [facility]  . Total abdominal hysterectomy  1987    LSO, APPENDECTOMY  . Pelvic laparoscopy  1989    RSO, LYSIS OF ADHESIONS  . Oophorectomy      LSO -RSO  . Back surgery      Fusion  . Ankle surgery Left     ligament  . Total hip arthroplasty Right 10/28/2013    dr Lorin Mercy  . Total hip arthroplasty Right 10/28/2013    Procedure: TOTAL HIP ARTHROPLASTY ANTERIOR APPROACH;  Surgeon: Marybelle Killings, MD;  Location: [facility];  Service: Orthopedics;  Laterality: Right;  Right Total Hip Arthroplasty, Direct Anterior Approach    History   Social History  . Marital Status: Married    Spouse Name: N/A    Number of Children: N/A  . Years of Education: N/A   Occupational History  . Not on file.   Social History Main Topics  . Smoking status: Never Smoker   . Smokeless tobacco: Never Used  .  Alcohol Use: No  . Drug Use: No  . Sexual Activity: No   Other Topics Concern  . Not on file   Social History Narrative  . No narrative on file    Current Outpatient Prescriptions on File Prior to Visit  Medication Sig Dispense Refill  . albuterol (PROAIR HFA) 108 (90 BASE) MCG/ACT inhaler Inhale 1-2 puffs into the lungs every 6 (six) hours as needed.  1 Inhaler  6  . aspirin EC 325 MG tablet Take 1 tablet (325 mg total) by mouth daily.  30 tablet  0  . atorvastatin (LIPITOR) 40 MG tablet Take 1 tablet (40 mg total) by mouth at bedtime.  90 tablet  2  . benazepril (LOTENSIN) 20 MG tablet Take 1 tablet (20 mg total) by mouth daily.  90 tablet  1  . ipratropium-albuterol (DUONEB) 0.5-2.5 (3) MG/3ML SOLN Take 3 mLs by nebulization every 4 (four) hours as needed. Must not be qid unless during exacerbation. Dx 493.90  360 mL  0  . levothyroxine (SYNTHROID, LEVOTHROID) 75 MCG tablet Take 1 tablet (75 mcg total) by mouth daily before breakfast.  30 tablet  5  .  metoprolol tartrate (LOPRESSOR) 25 MG tablet Take 25 mg by mouth 2 (two) times daily.      . mometasone-formoterol (DULERA) 200-5 MCG/ACT AERO Inhale 2 puffs into the lungs 2 (two) times daily.  8.8 g  5  . sertraline (ZOLOFT) 50 MG tablet Take 50 mg by mouth at bedtime.      . traZODone (DESYREL) 50 MG tablet Take 0.5 tablets (25 mg total) by mouth at bedtime.  30 tablet  0   No current facility-administered medications on file prior to visit.    Allergies  Allergen Reactions  . Tape     Prefers paper tape  . Fluticasone-Salmeterol     REACTION: headache.  She is tolerating Dulera well.    Family History  Problem Relation Age of Onset  . Heart attack Father   . Heart disease Father   . Heart failure Mother   . Pneumonia Mother   . Hypertension Mother   . Heart disease Mother   . Stroke Maternal Grandfather   . Hypertension Maternal Grandfather   . Asthma Paternal Uncle     PAT UNCLES  . Heart attack Paternal Uncle     BP 142/98  Pulse 82  Temp(Src) 98.2 F (36.8 C) (Oral)  Ht 5' (1.524 m)  Wt 164 lb (74.39 kg)  BMI 32.03 kg/m2  SpO2 92%   Review of Systems She has slight doe.  Denies falls.      Objective:   Physical Exam VITAL SIGNS:  See vs page GENERAL: no distress NECK: There is no palpable thyroid enlargement.  No thyroid nodule is palpable.  No palpable lymphadenopathy at the anterior neck. Gait: steady with a walker.       Assessment & Plan:  Postsurgical anemia.  therapy limited by intolerance of fe tabs Post-I-131 hypothyroidism, due for recheck. Hypocalcemia, new.  this usually resolved spontaneously, but we need to recheck.     Patient is advised the following: Patient Instructions  blood tests are being requested for you today.  We'll contact you with results. Please come back for a follow-up appointment in 2 months.    Most of the time, your thyroid will start working more or less than it is now.  This means that your need for the  levothyroxine will go up or down with time, bult  seldom stays the same.

## 2014-01-04 LAB — VITAMIN D 25 HYDROXY (VIT D DEFICIENCY, FRACTURES): Vit D, 25-Hydroxy: 26 ng/mL — ABNORMAL LOW (ref 30–89)

## 2014-01-06 LAB — PTH, INTACT AND CALCIUM
CALCIUM: 9.4 mg/dL (ref 8.4–10.5)
PTH: 46 pg/mL (ref 14–64)

## 2014-01-10 ENCOUNTER — Encounter: Payer: Self-pay | Admitting: Physical Medicine & Rehabilitation

## 2014-01-10 ENCOUNTER — Encounter: Payer: Medicare Other | Attending: Physical Medicine & Rehabilitation

## 2014-01-10 ENCOUNTER — Ambulatory Visit (HOSPITAL_BASED_OUTPATIENT_CLINIC_OR_DEPARTMENT_OTHER): Payer: Medicare Other | Admitting: Physical Medicine & Rehabilitation

## 2014-01-10 VITALS — BP 165/87 | HR 90 | Resp 14 | Wt 165.0 lb

## 2014-01-10 DIAGNOSIS — M4120 Other idiopathic scoliosis, site unspecified: Secondary | ICD-10-CM | POA: Diagnosis not present

## 2014-01-10 DIAGNOSIS — M24551 Contracture, right hip: Secondary | ICD-10-CM

## 2014-01-10 DIAGNOSIS — M419 Scoliosis, unspecified: Secondary | ICD-10-CM | POA: Insufficient documentation

## 2014-01-10 DIAGNOSIS — M24559 Contracture, unspecified hip: Secondary | ICD-10-CM | POA: Diagnosis not present

## 2014-01-10 DIAGNOSIS — M48062 Spinal stenosis, lumbar region with neurogenic claudication: Secondary | ICD-10-CM

## 2014-01-10 MED ORDER — GABAPENTIN 300 MG PO CAPS: 300.0000 mg | ORAL_CAPSULE | Freq: Every day | ORAL | Status: DC

## 2014-01-10 MED ORDER — TRAZODONE HCL 50 MG PO TABS: 50.0000 mg | ORAL_TABLET | Freq: Every evening | ORAL | Status: DC | PRN

## 2014-01-10 MED ORDER — HYDROCODONE-ACETAMINOPHEN 10-325 MG PO TABS: 1.0000 | ORAL_TABLET | Freq: Four times a day (QID) | ORAL | Status: DC | PRN

## 2014-01-10 NOTE — Patient Instructions (Addendum)
We'll see how the gabapentin helps. It was helpful several years ago May use trazodone on an as-needed basis

## 2014-01-10 NOTE — Progress Notes (Signed)
Subjective:    Patient ID: Hannah Scott, female    DOB: Mar 18, 1951, 63 y.o.   MRN: [id number]  HPI No postoperative complications after right hip replacement surgery. Has followed up with orthopedic surgery. Has completed home health PT Is able to stand upright with foot flat for short periods of time  Continues to have low back pain. Has stopped morphine 30 mg at night and has not noticed any major difference  Has some shooting pains as well as numbness and right thigh. This worsens with standing as well as laying down at night Pain Inventory Average Pain 6 Pain Right Now 6 My pain is constant, sharp, burning and aching  In the last 24 hours, has pain interfered with the following? General activity 10 Relation with others 10 Enjoyment of life 10 What TIME of day is your pain at its worst? all Sleep (in general) Fair  Pain is worse with: walking and standing Pain improves with: medication Relief from Meds: 5  Mobility use a cane use a walker ability to climb steps?  yes do you drive?  yes  Function disabled: date disabled . I need assistance with the following:  household duties and shopping  Neuro/Psych weakness numbness  Prior Studies Any changes since last visit?  no  Physicians involved in your care Any changes since last visit?  no   Family History  Problem Relation Age of Onset  . Heart attack Father   . Heart disease Father   . Heart failure Mother   . Pneumonia Mother   . Hypertension Mother   . Heart disease Mother   . Stroke Maternal Grandfather   . Hypertension Maternal Grandfather   . Asthma Paternal Uncle     PAT UNCLES  . Heart attack Paternal Uncle    History   Social History  . Marital Status: Married    Spouse Name: N/A    Number of Children: N/A  . Years of Education: N/A   Social History Main Topics  . Smoking status: Never Smoker   . Smokeless tobacco: Never Used  . Alcohol Use: No  . Drug Use: No  . Sexual Activity: No    Other Topics Concern  . None   Social History Narrative  . None   Past Surgical History  Procedure Laterality Date  . Cataract extraction Bilateral 01,03  . Cardiovascular stress test  09/07/05    Nuclear, was negative  . Breast lumpectomy  2003    radiation on right  . Appendectomy  1987    AT [facility]  . Total abdominal hysterectomy  1987    LSO, APPENDECTOMY  . Pelvic laparoscopy  1989    RSO, LYSIS OF ADHESIONS  . Oophorectomy      LSO -RSO  . Back surgery      Fusion  . Ankle surgery Left     ligament  . Total hip arthroplasty Right 10/28/2013    dr Lorin Mercy  . Total hip arthroplasty Right 10/28/2013    Procedure: TOTAL HIP ARTHROPLASTY ANTERIOR APPROACH;  Surgeon: Marybelle Killings, MD;  Location: [facility];  Service: Orthopedics;  Laterality: Right;  Right Total Hip Arthroplasty, Direct Anterior Approach   Past Medical History  Diagnosis Date  . Asthma     PFTs, February, 2011, moderate obstructive disease with response to bronchodilators, normal lung volumes, moderate reduction in diffusing capacity  . Obstructive airway disease   . Hyperlipidemia   . Hypertension   . Hypothyroidism  Patient has had in the past that she does not need treatment  . CAD (coronary artery disease)     90% distal LAD in the past  /   nuclear, 2008, no ischemia, ejection fraction 70%  . Atrial septal aneurysm     Echo, 2008-not noted on 13 echo  . Pinched nerve     lower back  . Kyphoscoliosis   . Shortness of breath   . Endometriosis 1989    RIGHT TUBE  . Endometriosis 1987    LEFT TUBE/OVARY W FOCAL IN-SITU ENDOMETRIAL ADENOCARCINOMA  . Cancer 2002    DUCTAL CIS--S/P LUMPECTOMY, RADIATION AND 6 WEEKS OF TAMOXIFEN  . Ejection fraction     EF 60%, echo, October, 2008  . Elevated CPK     January, 2014  . D-dimer, elevated     January, 2014  . Depression   . UTI (lower urinary tract infection)   . GERD (gastroesophageal reflux disease)     occ  . H/O hiatal hernia     ?  [name] Arthritis    . Anemia     hx   BP 165/87  Pulse 90  Resp 14  Wt 165 lb (74.844 kg)  SpO2 98%  Opioid Risk Score:   Fall Risk Score: Moderate Fall Risk (6-13 points) (previoulsy educated and given handout )  Review of Systems  Musculoskeletal: Positive for back pain.  Neurological: Positive for weakness and numbness.  All other systems reviewed and are negative.      Objective:   Physical Exam Right hip flexion contracture approximately 30 Able to stand for short duration with right foot flat, approximately 30 flexion at the hip  Motor strength is 4/5 in the right quad TA gastroc and 3 minus hip flexor 5/5 left quad TA gastroc and hip flexor  Ambulates with a rolling walker/posture.  Right dextro convex thoracic or lumbar scoliosis and kyphosis, severe        Assessment & Plan:  #1. Lumbar postlaminectomy syndrome status post lumbar fusion, his intermittent radicular symptoms mainly at night. We will prescribe gabapentin 300 mg each bedtime We'll discontinue morphine Change hydrocodone 10 mg 3 times per day May use trazodone 50 mg at night for sleep as needed  2. Right hip osteoarthritis with contracture. Contracture improved but still moderately severe at the hip flexors. Has completed PT. Now is able to stand upright with foot flat on the right side  Overall pain medication requirements have been reduced since surgery.  Return to clinic one month

## 2014-02-10 ENCOUNTER — Encounter: Payer: Medicare Other | Attending: Physical Medicine & Rehabilitation

## 2014-02-10 ENCOUNTER — Encounter: Payer: Self-pay | Admitting: Physical Medicine & Rehabilitation

## 2014-02-10 ENCOUNTER — Ambulatory Visit (HOSPITAL_BASED_OUTPATIENT_CLINIC_OR_DEPARTMENT_OTHER): Payer: Medicare Other | Admitting: Physical Medicine & Rehabilitation

## 2014-02-10 DIAGNOSIS — G894 Chronic pain syndrome: Secondary | ICD-10-CM

## 2014-02-10 DIAGNOSIS — M4806 Spinal stenosis, lumbar region: Secondary | ICD-10-CM | POA: Diagnosis not present

## 2014-02-10 DIAGNOSIS — M4126 Other idiopathic scoliosis, lumbar region: Secondary | ICD-10-CM | POA: Insufficient documentation

## 2014-02-10 DIAGNOSIS — Z5181 Encounter for therapeutic drug level monitoring: Secondary | ICD-10-CM

## 2014-02-10 DIAGNOSIS — M961 Postlaminectomy syndrome, not elsewhere classified: Secondary | ICD-10-CM | POA: Diagnosis not present

## 2014-02-10 DIAGNOSIS — M1611 Unilateral primary osteoarthritis, right hip: Secondary | ICD-10-CM | POA: Diagnosis present

## 2014-02-10 DIAGNOSIS — Z79899 Other long term (current) drug therapy: Secondary | ICD-10-CM

## 2014-02-10 MED ORDER — HYDROCODONE-ACETAMINOPHEN 10-325 MG PO TABS: 1.0000 | ORAL_TABLET | Freq: Four times a day (QID) | ORAL | Status: DC | PRN

## 2014-02-10 MED ORDER — GABAPENTIN 300 MG PO CAPS: 300.0000 mg | ORAL_CAPSULE | Freq: Every day | ORAL | Status: DC

## 2014-02-10 MED ORDER — ACETAMINOPHEN-CODEINE #4 300-60 MG PO TABS: 1.0000 | ORAL_TABLET | Freq: Three times a day (TID) | ORAL | Status: DC | PRN

## 2014-02-10 NOTE — Progress Notes (Signed)
Subjective:    Patient ID: Hannah Scott, female    DOB: 1950-08-30, 63 y.o.   MRN: [id number]  HPI Gabapentin 300 mg at night is very helpful for her legs jumping at night Her on morphine Doing well on increased hydrocodone 10 mg 3 times a day No longer needing trazodone.  No falls Walking with walker  No new medical issues since last visit. Sleep improving may go around 4 hours before awakening Pain Inventory Average Pain 6 Pain Right Now 6 My pain is constant, burning, tingling and aching  In the last 24 hours, has pain interfered with the following? General activity 10 Relation with others 10 Enjoyment of life 10 What TIME of day is your pain at its worst? all Sleep (in general) n/a  Pain is worse with: walking and standing Pain improves with: rest and medication Relief from Meds: 5  Mobility use a cane use a walker ability to climb steps?  yes do you drive?  yes  Function disabled: date disabled . I need assistance with the following:  household duties and shopping  Neuro/Psych weakness numbness tingling trouble walking  Prior Studies Any changes since last visit?  no  Physicians involved in your care Any changes since last visit?  no   Family History  Problem Relation Age of Onset  . Heart attack Father   . Heart disease Father   . Heart failure Mother   . Pneumonia Mother   . Hypertension Mother   . Heart disease Mother   . Stroke Maternal Grandfather   . Hypertension Maternal Grandfather   . Asthma Paternal Uncle     PAT UNCLES  . Heart attack Paternal Uncle    History   Social History  . Marital Status: Married    Spouse Name: N/A    Number of Children: N/A  . Years of Education: N/A   Social History Main Topics  . Smoking status: Never Smoker   . Smokeless tobacco: Never Used  . Alcohol Use: No  . Drug Use: No  . Sexual Activity: No   Other Topics Concern  . None   Social History Narrative  . None   Past Surgical  History  Procedure Laterality Date  . Cataract extraction Bilateral 01,03  . Cardiovascular stress test  09/07/05    Nuclear, was negative  . Breast lumpectomy  2003    radiation on right  . Appendectomy  1987    AT [facility]  . Total abdominal hysterectomy  1987    LSO, APPENDECTOMY  . Pelvic laparoscopy  1989    RSO, LYSIS OF ADHESIONS  . Oophorectomy      LSO -RSO  . Back surgery      Fusion  . Ankle surgery Left     ligament  . Total hip arthroplasty Right 10/28/2013    dr Lorin Mercy  . Total hip arthroplasty Right 10/28/2013    Procedure: TOTAL HIP ARTHROPLASTY ANTERIOR APPROACH;  Surgeon: Marybelle Killings, MD;  Location: [facility];  Service: Orthopedics;  Laterality: Right;  Right Total Hip Arthroplasty, Direct Anterior Approach   Past Medical History  Diagnosis Date  . Asthma     PFTs, February, 2011, moderate obstructive disease with response to bronchodilators, normal lung volumes, moderate reduction in diffusing capacity  . Obstructive airway disease   . Hyperlipidemia   . Hypertension   . Hypothyroidism     Patient has had in the past that she does not need treatment  . CAD (  coronary artery disease)     90% distal LAD in the past  /   nuclear, 2008, no ischemia, ejection fraction 70%  . Atrial septal aneurysm     Echo, 2008-not noted on 13 echo  . Pinched nerve     lower back  . Kyphoscoliosis   . Shortness of breath   . Endometriosis 1989    RIGHT TUBE  . Endometriosis 1987    LEFT TUBE/OVARY W FOCAL IN-SITU ENDOMETRIAL ADENOCARCINOMA  . Cancer 2002    DUCTAL CIS--S/P LUMPECTOMY, RADIATION AND 6 WEEKS OF TAMOXIFEN  . Ejection fraction     EF 60%, echo, October, 2008  . Elevated CPK     January, 2014  . D-dimer, elevated     January, 2014  . Depression   . UTI (lower urinary tract infection)   . GERD (gastroesophageal reflux disease)     occ  . H/O hiatal hernia     ?  [name] Arthritis   . Anemia     hx   There were no vitals taken for this visit.  Opioid Risk Score:     Fall Risk Score: Moderate Fall Risk (6-13 points) (previously educated and given handout) Review of Systems  Musculoskeletal: Positive for gait problem.  Neurological: Positive for weakness.       Tingling  All other systems reviewed and are negative.      Objective:   Physical Exam  Lower extremity strength 4/5 in the hip flexor knee extensor ankle dorsiflexor plantar flexor Ambulates with a rolling walker Also short distances without walker or holding onto other objects. Forward flexed posture. Able to stand with feet flat on the ground Severe kyphoscoliosis of the lumbar spine,  dextro convex     Assessment & Plan:  1. Lumbar postlaminectomy syndrome L4-L5 level performed over 5 years ago with chronic postoperative pain  2. Severe thoraco lumbar scoliosis, acquired  3. Hip osteoarthritis status post hip replacement 10/28/2013 with residual hip contracture  4. Lower extremity jumping sensation this appears to be restless legs rather than radicular discomfort. It is better on gabapentin  Continue hydrocodone 10 mg 3 times a day We did discuss that this would require monthly visits since it is a schedule 2 medication  I gave her an additional 20 tablets of Tylenol #4 to be taken 2 tablets 3 times a day in place of hydrocodone for 3 days. Will discuss how the substitution went next month and if helpful she can convert to this medication and have quarterly visits instead of monthly

## 2014-02-10 NOTE — Patient Instructions (Signed)
Try substituting Tylenol #4 one to 2 tablets 3 times a day as needed in place of hydrocodone for 2-3 days  We will discuss how this went for you next month

## 2014-02-11 ENCOUNTER — Encounter (HOSPITAL_BASED_OUTPATIENT_CLINIC_OR_DEPARTMENT_OTHER): Payer: Medicare Other | Attending: General Surgery

## 2014-02-11 DIAGNOSIS — S81811A Laceration without foreign body, right lower leg, initial encounter: Secondary | ICD-10-CM | POA: Diagnosis not present

## 2014-02-11 NOTE — H&P (Signed)
NAME:  Hannah Scott, Hannah Scott                   ACCOUNT NO.:  [id number]  MEDICAL RECORD NO.:  [id number]  LOCATION:  [facility]                          FACILITY:  [facility]  PHYSICIAN:  Elesa Hacker, M.D.        DATE OF BIRTH:  02-22-1951  DATE OF ADMISSION:  02/10/2014 DATE OF DISCHARGE:                             HISTORY & PHYSICAL   CHIEF COMPLAINT:  Wound on right leg.  HISTORY OF PRESENT ILLNESS:  Approximately 8 weeks ago, the patient banged her leg on a car door.  She did not seek medical help at that time or treatment for this since then.  PAST MEDICAL HISTORY:  Significant for, 1. Asthma. 2. Obstructive airway disease. 3. Hyperlipidemia. 4. Hypertension. 5. Hypothyroidism. 6. Coronary artery disease. 7. Anemia. 8. Arthritis. 9. Endometriosis. 10.Breast cancer. 11.Depression. 12.UTI. 13.GERD. 14.Hiatal hernia. 15.MI x2.  PAST SURGICAL HISTORY: 1. Cataract extraction. 2. Breast lumpectomy and radiation. 3. Appendectomy. 4. Hysterectomy. 5. Oophorectomy. 6. Back surgery. 7. Ankle surgery. 8. Right hip arthroplasty in July of this year. 9. Lumbar laminectomy.  SOCIAL HISTORY:  Cigarettes none.  Alcohol none.  MEDICATIONS:  Albuterol, aspirin, Lipitor, Lotensin, Neurontin, Norco, DuoNeb, Synthroid, Lopressor, Zoloft, and Desyrel.  REVIEW OF SYSTEMS:  As above.  PHYSICAL EXAMINATION:  VITAL SIGNS:  Temperature 98.4, pulse 70, respirations 16, blood pressure 162/65. GENERAL APPEARANCE:  Well developed, well nourished, no distress. CHEST:  Clear. HEART:  Regular rhythm. LOWER EXTREMITIES:  An eschar 3.5 x 1.3.  This was removed revealing a shaggy base to the wound.  Dorsalis pedis pulse is palpable.  ABI is 1.0.  IMPRESSION:  Trauma, left leg.  PLAN OF TREATMENT:  We will start with Santyl and Hydrogel.  We will see her in 7 days.     Elesa Hacker, M.D.     RA/MEDQ  D:  02/11/2014  T:  02/11/2014  Job:  [id number]

## 2014-02-20 DIAGNOSIS — S81811A Laceration without foreign body, right lower leg, initial encounter: Secondary | ICD-10-CM | POA: Diagnosis not present

## 2014-02-24 ENCOUNTER — Other Ambulatory Visit: Payer: Self-pay

## 2014-02-24 ENCOUNTER — Encounter: Payer: Self-pay | Admitting: Physical Medicine & Rehabilitation

## 2014-02-24 NOTE — Telephone Encounter (Signed)
Do not see on present medication list

## 2014-02-24 NOTE — Telephone Encounter (Signed)
OK X 2 mos; needs F/U

## 2014-02-25 MED ORDER — BENAZEPRIL HCL 40 MG PO TABS: ORAL_TABLET | ORAL | Status: DC

## 2014-02-27 ENCOUNTER — Encounter (HOSPITAL_BASED_OUTPATIENT_CLINIC_OR_DEPARTMENT_OTHER): Payer: Medicare Other | Attending: Internal Medicine

## 2014-02-27 DIAGNOSIS — X58XXXD Exposure to other specified factors, subsequent encounter: Secondary | ICD-10-CM | POA: Diagnosis not present

## 2014-02-27 DIAGNOSIS — S81801D Unspecified open wound, right lower leg, subsequent encounter: Secondary | ICD-10-CM | POA: Insufficient documentation

## 2014-03-03 ENCOUNTER — Encounter: Payer: Self-pay | Admitting: Endocrinology

## 2014-03-03 ENCOUNTER — Ambulatory Visit (INDEPENDENT_AMBULATORY_CARE_PROVIDER_SITE_OTHER): Payer: Medicare Other | Admitting: Endocrinology

## 2014-03-03 VITALS — BP 152/94 | HR 114 | Temp 98.0°F | Ht 60.0 in | Wt 162.0 lb

## 2014-03-03 DIAGNOSIS — D62 Acute posthemorrhagic anemia: Secondary | ICD-10-CM

## 2014-03-03 DIAGNOSIS — E042 Nontoxic multinodular goiter: Secondary | ICD-10-CM

## 2014-03-03 DIAGNOSIS — E890 Postprocedural hypothyroidism: Secondary | ICD-10-CM

## 2014-03-03 LAB — IBC PANEL
IRON: 51 ug/dL (ref 42–145)
Saturation Ratios: 12.4 % — ABNORMAL LOW (ref 20.0–50.0)
Transferrin: 294.8 mg/dL (ref 212.0–360.0)

## 2014-03-03 LAB — CBC WITH DIFFERENTIAL/PLATELET
BASOS PCT: 0.4 % (ref 0.0–3.0)
Basophils Absolute: 0 10*3/uL (ref 0.0–0.1)
Eosinophils Absolute: 0.5 10*3/uL (ref 0.0–0.7)
Eosinophils Relative: 9 % — ABNORMAL HIGH (ref 0.0–5.0)
HCT: 33.5 % — ABNORMAL LOW (ref 36.0–46.0)
Hemoglobin: 10.5 g/dL — ABNORMAL LOW (ref 12.0–15.0)
LYMPHS ABS: 0.8 10*3/uL (ref 0.7–4.0)
Lymphocytes Relative: 14.3 % (ref 12.0–46.0)
MCHC: 31.5 g/dL (ref 30.0–36.0)
MCV: 75.1 fl — AB (ref 78.0–100.0)
MONO ABS: 0.4 10*3/uL (ref 0.1–1.0)
Monocytes Relative: 6.4 % (ref 3.0–12.0)
Neutro Abs: 4 10*3/uL (ref 1.4–7.7)
Neutrophils Relative %: 69.9 % (ref 43.0–77.0)
PLATELETS: 348 10*3/uL (ref 150.0–400.0)
RBC: 4.45 Mil/uL (ref 3.87–5.11)
RDW: 19.5 % — ABNORMAL HIGH (ref 11.5–15.5)
WBC: 5.7 10*3/uL (ref 4.0–10.5)

## 2014-03-03 LAB — TSH: TSH: 1.03 u[IU]/mL (ref 0.35–4.50)

## 2014-03-03 NOTE — Patient Instructions (Addendum)
blood tests are being requested for you today.  We'll contact you with results. Also, let's recheck the ultrasound.  you will receive a phone call, about a day and time for an appointment Please come back for a follow-up appointment in 4 months.    Most of the time, your thyroid will start working more or less than it is now.  This means that your need for the levothyroxine will go up or down with time, bult seldom stays the same.

## 2014-03-03 NOTE — Progress Notes (Signed)
Subjective:    Patient ID: Hannah Scott, female    DOB: April 06, 1951, 63 y.o.   MRN: [id number]  HPI The state of at least three ongoing medical problems is addressed today, with interval history of each noted here: Pt returns for f/u of post-I-131 hypothyroidism (she had i-131 rx for hyperthyroidism, due to multinodular goiter, in September, 2013; in early 2014, TSH was high, and synthroid was started). She does not notice the goiter.  She denies weight change She had severe anemia after surgery 4 mos ago.  She stopped fe tabs, due to nausea.  Denies BRBPR.    Past Medical History  Diagnosis Date  . Asthma     PFTs, February, 2011, moderate obstructive disease with response to bronchodilators, normal lung volumes, moderate reduction in diffusing capacity  . Obstructive airway disease   . Hyperlipidemia   . Hypertension   . Hypothyroidism     Patient has had in the past that she does not need treatment  . CAD (coronary artery disease)     90% distal LAD in the past  /   nuclear, 2008, no ischemia, ejection fraction 70%  . Atrial septal aneurysm     Echo, 2008-not noted on 13 echo  . Pinched nerve     lower back  . Kyphoscoliosis   . Shortness of breath   . Endometriosis 1989    RIGHT TUBE  . Endometriosis 1987    LEFT TUBE/OVARY W FOCAL IN-SITU ENDOMETRIAL ADENOCARCINOMA  . Cancer 2002    DUCTAL CIS--S/P LUMPECTOMY, RADIATION AND 6 WEEKS OF TAMOXIFEN  . Ejection fraction     EF 60%, echo, October, 2008  . Elevated CPK     January, 2014  . D-dimer, elevated     January, 2014  . Depression   . UTI (lower urinary tract infection)   . GERD (gastroesophageal reflux disease)     occ  . H/O hiatal hernia     ?  [name] Arthritis   . Anemia     hx    Past Surgical History  Procedure Laterality Date  . Cataract extraction Bilateral 01,03  . Cardiovascular stress test  09/07/05    Nuclear, was negative  . Breast lumpectomy  2003    radiation on right  . Appendectomy  1987    AT  [facility]  . Total abdominal hysterectomy  1987    LSO, APPENDECTOMY  . Pelvic laparoscopy  1989    RSO, LYSIS OF ADHESIONS  . Oophorectomy      LSO -RSO  . Back surgery      Fusion  . Ankle surgery Left     ligament  . Total hip arthroplasty Right 10/28/2013    dr Lorin Mercy  . Total hip arthroplasty Right 10/28/2013    Procedure: TOTAL HIP ARTHROPLASTY ANTERIOR APPROACH;  Surgeon: Marybelle Killings, MD;  Location: [facility];  Service: Orthopedics;  Laterality: Right;  Right Total Hip Arthroplasty, Direct Anterior Approach    History   Social History  . Marital Status: Married    Spouse Name: N/A    Number of Children: N/A  . Years of Education: N/A   Occupational History  . Not on file.   Social History Main Topics  . Smoking status: Never Smoker   . Smokeless tobacco: Never Used  . Alcohol Use: No  . Drug Use: No  . Sexual Activity: No   Other Topics Concern  . Not on file   Social History Narrative  Current Outpatient Prescriptions on File Prior to Visit  Medication Sig Dispense Refill  . acetaminophen-codeine (TYLENOL #4) 300-60 MG per tablet Take 1-2 tablets by mouth every 8 (eight) hours as needed for moderate pain. 20 tablet 0  . albuterol (PROAIR HFA) 108 (90 BASE) MCG/ACT inhaler Inhale 1-2 puffs into the lungs every 6 (six) hours as needed. 1 Inhaler 6  . aspirin EC 325 MG tablet Take 1 tablet (325 mg total) by mouth daily. 30 tablet 0  . atorvastatin (LIPITOR) 40 MG tablet Take 1 tablet (40 mg total) by mouth at bedtime. 90 tablet 2  . benazepril (LOTENSIN) 20 MG tablet Take 1 tablet (20 mg total) by mouth daily. 90 tablet 1  . benazepril (LOTENSIN) 40 MG tablet Take 1/2 tablet by mouth every other day 60 tablet 0  . gabapentin (NEURONTIN) 300 MG capsule Take 1 capsule (300 mg total) by mouth at bedtime. 30 capsule 5  . HYDROcodone-acetaminophen (NORCO) 10-325 MG per tablet Take 1 tablet by mouth every 6 (six) hours as needed. 90 tablet 0  . ipratropium-albuterol (DUONEB)  0.5-2.5 (3) MG/3ML SOLN Take 3 mLs by nebulization every 4 (four) hours as needed. Must not be qid unless during exacerbation. Dx 493.90 360 mL 0  . levothyroxine (SYNTHROID, LEVOTHROID) 75 MCG tablet Take 1 tablet (75 mcg total) by mouth daily before breakfast. 30 tablet 5  . metoprolol tartrate (LOPRESSOR) 25 MG tablet Take 25 mg by mouth 2 (two) times daily.    . mometasone-formoterol (DULERA) 200-5 MCG/ACT AERO Inhale 2 puffs into the lungs 2 (two) times daily. 8.8 g 5  . sertraline (ZOLOFT) 50 MG tablet Take 50 mg by mouth at bedtime.     No current facility-administered medications on file prior to visit.    Allergies  Allergen Reactions  . Tape     Prefers paper tape  . Fluticasone-Salmeterol     REACTION: headache.  She is tolerating Dulera well.    Family History  Problem Relation Age of Onset  . Heart attack Father   . Heart disease Father   . Heart failure Mother   . Pneumonia Mother   . Hypertension Mother   . Heart disease Mother   . Stroke Maternal Grandfather   . Hypertension Maternal Grandfather   . Asthma Paternal Uncle     PAT UNCLES  . Heart attack Paternal Uncle     BP 152/94 mmHg  Pulse 114  Temp(Src) 98 F (36.7 C) (Oral)  Ht 5' (1.524 m)  Wt 162 lb (73.483 kg)  BMI 31.64 kg/m2  SpO2 90%  Review of Systems Denies hematuria.  She has intermittent hoarseness.      Objective:   Physical Exam VITAL SIGNS:  See vs page GENERAL: no distress NECK: There is no palpable thyroid enlargement.  No thyroid nodule is palpable.  No palpable lymphadenopathy at the anterior neck.   Lab Results  Component Value Date   TSH 1.03 03/03/2014   Lab Results  Component Value Date   WBC 5.7 03/03/2014   HGB 10.5* 03/03/2014   HCT 33.5* 03/03/2014   MCV 75.1* 03/03/2014   PLT 348.0 03/03/2014   Lab Results  Component Value Date   IRON 51 03/03/2014   TIBC 425 01/03/2014       Assessment & Plan:  Postsurgical anemia.  therapy limited by intolerance of  fe tabs Post-I-131 hypothyroidism, well-replaced fe-deficiency, persistent.    Patient is advised the following: Patient Instructions  blood tests are being requested  for you today.  We'll contact you with results. Also, let's recheck the ultrasound.  you will receive a phone call, about a day and time for an appointment Please come back for a follow-up appointment in 4 months.    Most of the time, your thyroid will start working more or less than it is now.  This means that your need for the levothyroxine will go up or down with time, bult seldom stays the same.    addendum: please see Dr Linna Darner about anemia.

## 2014-03-04 ENCOUNTER — Other Ambulatory Visit: Payer: Self-pay | Admitting: *Deleted

## 2014-03-04 MED ORDER — METOPROLOL TARTRATE 25 MG PO TABS: 25.0000 mg | ORAL_TABLET | Freq: Two times a day (BID) | ORAL | Status: DC

## 2014-03-04 NOTE — Telephone Encounter (Signed)
Pt stated pharmacy been trying to get her metoprolol fill since Friday they have still not received. Inform pt we haven't received any request, but will go ahead and send refill into [facility]...Johny Chess

## 2014-03-06 ENCOUNTER — Other Ambulatory Visit: Payer: Medicare Other

## 2014-03-06 DIAGNOSIS — S81801D Unspecified open wound, right lower leg, subsequent encounter: Secondary | ICD-10-CM | POA: Diagnosis not present

## 2014-03-10 ENCOUNTER — Encounter: Payer: Medicare Other | Attending: Physical Medicine & Rehabilitation

## 2014-03-10 ENCOUNTER — Ambulatory Visit (HOSPITAL_BASED_OUTPATIENT_CLINIC_OR_DEPARTMENT_OTHER): Payer: Medicare Other | Admitting: Physical Medicine & Rehabilitation

## 2014-03-10 ENCOUNTER — Encounter: Payer: Self-pay | Admitting: Physical Medicine & Rehabilitation

## 2014-03-10 VITALS — BP 158/63 | HR 84 | Resp 14 | Wt 162.0 lb

## 2014-03-10 DIAGNOSIS — M961 Postlaminectomy syndrome, not elsewhere classified: Secondary | ICD-10-CM | POA: Insufficient documentation

## 2014-03-10 DIAGNOSIS — M4126 Other idiopathic scoliosis, lumbar region: Secondary | ICD-10-CM | POA: Insufficient documentation

## 2014-03-10 DIAGNOSIS — M1611 Unilateral primary osteoarthritis, right hip: Secondary | ICD-10-CM | POA: Insufficient documentation

## 2014-03-10 DIAGNOSIS — M48062 Spinal stenosis, lumbar region with neurogenic claudication: Secondary | ICD-10-CM

## 2014-03-10 DIAGNOSIS — M4806 Spinal stenosis, lumbar region: Secondary | ICD-10-CM | POA: Insufficient documentation

## 2014-03-10 DIAGNOSIS — M419 Scoliosis, unspecified: Secondary | ICD-10-CM

## 2014-03-10 MED ORDER — HYDROCODONE-ACETAMINOPHEN 10-325 MG PO TABS: 1.0000 | ORAL_TABLET | Freq: Four times a day (QID) | ORAL | Status: DC | PRN

## 2014-03-10 NOTE — Progress Notes (Signed)
Subjective:    Patient ID: Hannah Scott, female    DOB: 1950/09/06, 63 y.o.   MRN: [id number]  HPI Tried Tylenol for in place of hydrocodone. When she was not active this medication did help with her pain however when her activity level increased it was not adequate for breakthrough pain.  Continues to get good relief from gabapentin at night has had 2 nights where it did not adequately relieve her pain. At this point she is satisfied with her current dosage. Pain Inventory Average Pain 7 Pain Right Now 6 My pain is constant, burning, tingling and aching  In the last 24 hours, has pain interfered with the following? General activity 10 Relation with others 10 Enjoyment of life 10 What TIME of day is your pain at its worst? all Sleep (in general) Poor  Pain is worse with: some activites Pain improves with: medication Relief from Meds: 6  Mobility use a cane use a walker ability to climb steps?  yes do you drive?  yes  Function disabled: date disabled . I need assistance with the following:  household duties and shopping  Neuro/Psych weakness numbness tingling  Prior Studies Any changes since last visit?  no  Physicians involved in your care Any changes since last visit?  no   Family History  Problem Relation Age of Onset  . Heart attack Father   . Heart disease Father   . Heart failure Mother   . Pneumonia Mother   . Hypertension Mother   . Heart disease Mother   . Stroke Maternal Grandfather   . Hypertension Maternal Grandfather   . Asthma Paternal Uncle     PAT UNCLES  . Heart attack Paternal Uncle    History   Social History  . Marital Status: Married    Spouse Name: N/A    Number of Children: N/A  . Years of Education: N/A   Social History Main Topics  . Smoking status: Never Smoker   . Smokeless tobacco: Never Used  . Alcohol Use: No  . Drug Use: No  . Sexual Activity: No   Other Topics Concern  . None   Social History Narrative    Past Surgical History  Procedure Laterality Date  . Cataract extraction Bilateral 01,03  . Cardiovascular stress test  09/07/05    Nuclear, was negative  . Breast lumpectomy  2003    radiation on right  . Appendectomy  1987    [facility]  . Total abdominal hysterectomy  1987    LSO, APPENDECTOMY  . Pelvic laparoscopy  1989    RSO, LYSIS OF ADHESIONS  . Oophorectomy      LSO -RSO  . Back surgery      Fusion  . Ankle surgery Left     ligament  . Total hip arthroplasty Right 10/28/2013    dr Lorin Mercy  . Total hip arthroplasty Right 10/28/2013    Procedure: TOTAL HIP ARTHROPLASTY ANTERIOR APPROACH;  Surgeon: Marybelle Killings, MD;  Location: [facility];  Service: Orthopedics;  Laterality: Right;  Right Total Hip Arthroplasty, Direct Anterior Approach   Past Medical History  Diagnosis Date  . Asthma     PFTs, February, 2011, moderate obstructive disease with response to bronchodilators, normal lung volumes, moderate reduction in diffusing capacity  . Obstructive airway disease   . Hyperlipidemia   . Hypertension   . Hypothyroidism     Patient has had in the past that she does not need treatment  .  CAD (coronary artery disease)     90% distal LAD in the past  /   nuclear, 2008, no ischemia, ejection fraction 70%  . Atrial septal aneurysm     Echo, 2008-not noted on 13 echo  . Pinched nerve     lower back  . Kyphoscoliosis   . Shortness of breath   . Endometriosis 1989    RIGHT TUBE  . Endometriosis 1987    LEFT TUBE/OVARY W FOCAL IN-SITU ENDOMETRIAL ADENOCARCINOMA  . Cancer 2002    DUCTAL CIS--S/P LUMPECTOMY, RADIATION AND 6 WEEKS OF TAMOXIFEN  . Ejection fraction     EF 60%, echo, October, 2008  . Elevated CPK     January, 2014  . D-dimer, elevated     January, 2014  . Depression   . UTI (lower urinary tract infection)   . GERD (gastroesophageal reflux disease)     occ  . H/O hiatal hernia     ?  [name] Arthritis   . Anemia     hx   BP 158/63 mmHg  Pulse 84  Resp 14  Wt 162 lb  (73.483 kg)  SpO2 94%  Opioid Risk Score:   Fall Risk Score:    Review of Systems  Neurological: Positive for weakness and numbness.       Tingling  All other systems reviewed and are negative.      Objective:   Physical Exam  Constitutional: She is oriented to person, place, and time.  Obese  HENT:  Head: Normocephalic and atraumatic.  Eyes: Conjunctivae and EOM are normal. Pupils are equal, round, and reactive to light.  Neurological: She is alert and oriented to person, place, and time.  Psychiatric: She has a normal mood and affect.  Nursing note and vitals reviewed.   Severe kyphoscoliosis of the lumbar spine, dextro convex No tenderness palpation along the lumbar spine 4/5 bilateral hip flexors, knee extensors, ankle dorsiflexors and plantar flexors Ambulates with rolling walker. Decreased hip extension, kyphotic posture Right hip range of motion reduced with extension internal/external rotation, Better than preop however      Assessment & Plan:  1. Lumbar postlaminectomy syndrome L4-L5 level performed over 5 years ago with chronic postoperative pain  2. Severe thoraco lumbar scoliosis, acquired  3. Hip osteoarthritis status post hip replacement 10/28/2013 with residual hip contracture  4. Lower extremity jumping sensation this appears to be restless legs rather than radicular discomfort. It is better on gabapentin  Continue hydrocodone 10 mg 3 times a day We discussed there is no reason to use both the Tylenol #4 and the hydrocodone.We will DC the Tylenol No. 4

## 2014-03-10 NOTE — Patient Instructions (Signed)
If gabapentin does not relieve leg symptoms at night we can increase the dose from 300 mg to 400 mg  Will discontinue Tylenol No. 4  Continue hydrocodone 10 mg 3 times per day

## 2014-03-12 ENCOUNTER — Ambulatory Visit
Admission: RE | Admit: 2014-03-12 | Discharge: 2014-03-12 | Disposition: A | Discharge status: Home / Self Care | Payer: Medicare Other | Source: Ambulatory Visit | Attending: Endocrinology | Admitting: Endocrinology

## 2014-03-12 DIAGNOSIS — E042 Nontoxic multinodular goiter: Secondary | ICD-10-CM

## 2014-03-27 ENCOUNTER — Encounter: Payer: Self-pay | Admitting: Internal Medicine

## 2014-03-27 ENCOUNTER — Other Ambulatory Visit: Payer: Self-pay | Admitting: Endocrinology

## 2014-04-07 ENCOUNTER — Encounter: Payer: Self-pay | Admitting: Internal Medicine

## 2014-04-09 ENCOUNTER — Encounter: Payer: Self-pay | Admitting: Registered Nurse

## 2014-04-09 ENCOUNTER — Encounter: Payer: Medicare Other | Attending: Physical Medicine & Rehabilitation | Admitting: Registered Nurse

## 2014-04-09 VITALS — BP 150/78 | HR 73 | Resp 14

## 2014-04-09 DIAGNOSIS — M4126 Other idiopathic scoliosis, lumbar region: Secondary | ICD-10-CM | POA: Diagnosis not present

## 2014-04-09 DIAGNOSIS — M4806 Spinal stenosis, lumbar region: Secondary | ICD-10-CM | POA: Diagnosis not present

## 2014-04-09 DIAGNOSIS — M419 Scoliosis, unspecified: Secondary | ICD-10-CM

## 2014-04-09 DIAGNOSIS — M1611 Unilateral primary osteoarthritis, right hip: Secondary | ICD-10-CM | POA: Diagnosis not present

## 2014-04-09 DIAGNOSIS — Z79899 Other long term (current) drug therapy: Secondary | ICD-10-CM

## 2014-04-09 DIAGNOSIS — G894 Chronic pain syndrome: Secondary | ICD-10-CM

## 2014-04-09 DIAGNOSIS — Z5181 Encounter for therapeutic drug level monitoring: Secondary | ICD-10-CM

## 2014-04-09 DIAGNOSIS — M961 Postlaminectomy syndrome, not elsewhere classified: Secondary | ICD-10-CM | POA: Diagnosis not present

## 2014-04-09 MED ORDER — HYDROCODONE-ACETAMINOPHEN 10-325 MG PO TABS: 1.0000 | ORAL_TABLET | Freq: Four times a day (QID) | ORAL | Status: DC | PRN

## 2014-04-09 NOTE — Progress Notes (Signed)
Subjective:    Patient ID: Hannah Scott, female    DOB: Aug 30, 1950, 63 y.o.   MRN: [id number]  HPI: Hannah Scott is a 63 year old female who returns for follow up for chronic pain and medication refill. She says her pain is located in her lower back. She rates her pain 6. Her current exercise regime is walking for short distances using her cadillac walker.  Hannah Scott tearful talking about the holidays as it relates to the loss of her husband. Emotional support given. She counsels with her Doristine Bosworth and talks with various widows at her church.  She admits to depression, no suicidal ideation or plan.  Pain Inventory Average Pain 7 Pain Right Now 6 My pain is constant, burning, tingling and aching  In the last 24 hours, has pain interfered with the following? General activity 10 Relation with others 10 Enjoyment of life 10 What TIME of day is your pain at its worst? all Sleep (in general) Fair  Pain is worse with: walking and standing Pain improves with: rest and medication Relief from Meds: 4  Mobility walk with assistance use a cane use a walker ability to climb steps?  yes do you drive?  yes  Function disabled: date disabled . I need assistance with the following:  household duties and shopping  Neuro/Psych weakness numbness tingling trouble walking  Prior Studies Any changes since last visit?  no  Physicians involved in your care Any changes since last visit?  no   Family History  Problem Relation Age of Onset  . Heart attack Father   . Heart disease Father   . Heart failure Mother   . Pneumonia Mother   . Hypertension Mother   . Heart disease Mother   . Stroke Maternal Grandfather   . Hypertension Maternal Grandfather   . Asthma Paternal Uncle     PAT UNCLES  . Heart attack Paternal Uncle    History   Social History  . Marital Status: Married    Spouse Name: N/A    Number of Children: N/A  . Years of Education: N/A   Social History Main Topics    . Smoking status: Never Smoker   . Smokeless tobacco: Never Used  . Alcohol Use: No  . Drug Use: No  . Sexual Activity: No   Other Topics Concern  . None   Social History Narrative   Past Surgical History  Procedure Laterality Date  . Cataract extraction Bilateral 01,03  . Cardiovascular stress test  09/07/05    Nuclear, was negative  . Breast lumpectomy  2003    radiation on right  . Appendectomy  1987    AT [facility]  . Total abdominal hysterectomy  1987    LSO, APPENDECTOMY  . Pelvic laparoscopy  1989    RSO, LYSIS OF ADHESIONS  . Oophorectomy      LSO -RSO  . Back surgery      Fusion  . Ankle surgery Left     ligament  . Total hip arthroplasty Right 10/28/2013    dr Lorin Mercy  . Total hip arthroplasty Right 10/28/2013    Procedure: TOTAL HIP ARTHROPLASTY ANTERIOR APPROACH;  Surgeon: Marybelle Killings, MD;  Location: [facility];  Service: Orthopedics;  Laterality: Right;  Right Total Hip Arthroplasty, Direct Anterior Approach   Past Medical History  Diagnosis Date  . Asthma     PFTs, February, 2011, moderate obstructive disease with response to bronchodilators, normal lung volumes, moderate reduction  in diffusing capacity  . Obstructive airway disease   . Hyperlipidemia   . Hypertension   . Hypothyroidism     Patient has had in the past that she does not need treatment  . CAD (coronary artery disease)     90% distal LAD in the past  /   nuclear, 2008, no ischemia, ejection fraction 70%  . Atrial septal aneurysm     Echo, 2008-not noted on 13 echo  . Pinched nerve     lower back  . Kyphoscoliosis   . Shortness of breath   . Endometriosis 1989    RIGHT TUBE  . Endometriosis 1987    LEFT TUBE/OVARY W FOCAL IN-SITU ENDOMETRIAL ADENOCARCINOMA  . Cancer 2002    DUCTAL CIS--S/P LUMPECTOMY, RADIATION AND 6 WEEKS OF TAMOXIFEN  . Ejection fraction     EF 60%, echo, October, 2008  . Elevated CPK     January, 2014  . D-dimer, elevated     January, 2014  . Depression   . UTI (lower  urinary tract infection)   . GERD (gastroesophageal reflux disease)     occ  . H/O hiatal hernia     ?  [name] Arthritis   . Anemia     hx   BP 150/78 mmHg  Pulse 73  Resp 14  SpO2 95%  Opioid Risk Score:   Fall Risk Score: Moderate Fall Risk (6-13 points) (pt has rec'd pamphlet during previous visit) Review of Systems  Musculoskeletal: Positive for gait problem.  Neurological: Positive for weakness and numbness.       Tingling  All other systems reviewed and are negative.      Objective:   Physical Exam  Constitutional: She is oriented to person, place, and time. She appears well-developed and well-nourished.  HENT:  Head: Normocephalic and atraumatic.  Neck: Normal range of motion. Neck supple.  Cardiovascular: Normal rate and regular rhythm.   Pulmonary/Chest: Effort normal and breath sounds normal.  Musculoskeletal:  Normal Muscle Bulk and Muscle testing Reveals: Upper Extremities: Full ROM and Muscle strength 5/5 Lumbar Scoliosis Noted Back without spinal or paraspinal tenderness Lower Extremities: Right Leg Flexion Produces pain into Right Hip Arises from chair with ease Narrow Based gait  Neurological: She is alert and oriented to person, place, and time.  Skin: Skin is warm and dry.  Psychiatric: She has a normal mood and affect.  Nursing note and vitals reviewed.         Assessment & Plan:  1 Lumbar post laminectomy syndrome, s/p lumbar fusion:  Refilled: Hydrocodone 10/325mg  one tablet every 8 hours #90 2. Right Hip OA: S/P Right Hip Replacement 10/28/2013. 3.Depression: On Zoloft. Has Family Support and Friends.  Counseling with Doristine Bosworth.  20 minutes of face to face patient care time was spent during this visit. All questions were encouraged and answered.

## 2014-04-10 ENCOUNTER — Encounter: Payer: Self-pay | Admitting: Oncology

## 2014-04-14 ENCOUNTER — Other Ambulatory Visit: Payer: Self-pay | Admitting: Internal Medicine

## 2014-04-14 MED ORDER — BENAZEPRIL HCL 40 MG PO TABS: ORAL_TABLET | ORAL | Status: DC

## 2014-04-14 MED ORDER — SERTRALINE HCL 50 MG PO TABS: 50.0000 mg | ORAL_TABLET | Freq: Every day | ORAL | Status: DC

## 2014-04-14 NOTE — Telephone Encounter (Signed)
Patient states pharmacy ([facility]) is faxing over refill requests.  She wanted to make sure that benazepril script is for 40mg  taking 1/2 pill daily for insurance issues.  Also should be a request for sertraline.

## 2014-04-14 NOTE — Telephone Encounter (Signed)
Okay for refill on Setraline?

## 2014-04-14 NOTE — Telephone Encounter (Signed)
Benazepril #45 Sertraline #90

## 2014-04-21 ENCOUNTER — Encounter: Payer: Self-pay | Admitting: Internal Medicine

## 2014-04-21 ENCOUNTER — Ambulatory Visit (INDEPENDENT_AMBULATORY_CARE_PROVIDER_SITE_OTHER): Payer: Medicare Other | Admitting: Internal Medicine

## 2014-04-21 VITALS — BP 154/108 | HR 99 | Temp 98.3°F | Wt 161.5 lb

## 2014-04-21 DIAGNOSIS — J449 Chronic obstructive pulmonary disease, unspecified: Secondary | ICD-10-CM

## 2014-04-21 DIAGNOSIS — J310 Chronic rhinitis: Secondary | ICD-10-CM

## 2014-04-21 DIAGNOSIS — J441 Chronic obstructive pulmonary disease with (acute) exacerbation: Secondary | ICD-10-CM

## 2014-04-21 MED ORDER — PREDNISONE 20 MG PO TABS: 20.0000 mg | ORAL_TABLET | Freq: Two times a day (BID) | ORAL | Status: DC

## 2014-04-21 MED ORDER — IPRATROPIUM-ALBUTEROL 0.5-2.5 (3) MG/3ML IN SOLN: 3.0000 mL | RESPIRATORY_TRACT | Status: DC | PRN

## 2014-04-21 MED ORDER — AMOXICILLIN 500 MG PO CAPS: 500.0000 mg | ORAL_CAPSULE | Freq: Three times a day (TID) | ORAL | Status: DC

## 2014-04-21 NOTE — Progress Notes (Signed)
Pre visit review using our clinic review tool, if applicable. No additional management support is needed unless otherwise documented below in the visit note. 

## 2014-04-21 NOTE — Patient Instructions (Addendum)
Plain Mucinex (NOT D) for thick secretions ;force NON dairy fluids .   Nasal cleansing in the shower as discussed with lather of mild shampoo.After 10 seconds wash off lather while  exhaling through nostrils. Make sure that all residual soap is removed to prevent irritation.   Nasacort AQ 1 spray in each nostril twice a day as needed. Use the "crossover" technique into opposite nostril spraying toward opposite ear @ 45 degree angle, not straight up into nostril.  Plain Allegra (NOT D )  160 daily , Loratidine 10 mg , OR Zyrtec 10 mg @ bedtime  as needed for itchy eyes & sneezing.   Breo one inhalation daily; gargle and spit after use. Lot #: [id number] Exp 07/17 Instructions written on box by Dr Luis Abed the  prescription for antibiotic it there is not dramatic improvement in the next 72 hours.

## 2014-04-21 NOTE — Progress Notes (Signed)
   Subjective:    Patient ID: Hannah Scott, female    DOB: 04-13-1951, 63 y.o.   MRN: [id number]  HPI  She's had progressive wheezing over the last week. It initially was low-grade but is now significant. The only trigger has been postnasal drainage. She's been using her nebulizer twice a day.  She has run out of the controlling agent,Dulera, and cannot afford to refill it  Her only other symptom was a slight scratchy throat.  Her wheezing and dyspnea have been significant.  She denies any symptoms or signs of an upper respiratory tract infection.  Review of Systems Frontal headache, facial pain , nasal purulence, dental pain, sore throat , otic pain or otic discharge denied. No fever , chills or sweats.     Objective:   Physical Exam  Pertinent or positive findings include: Breath sounds are distant with very faint wheezing in a homogenous pattern She has profound kyphoscoliosis and ambulates with a cane.  General appearance:adequately  nourished; no acute distress or increased work of breathing is present.  No  lymphadenopathy about the head, neck, or axilla noted.  Eyes: No conjunctival inflammation or lid edema is present. There is no scleral icterus. Ears:  External ear exam shows no significant lesions or deformities.  Otoscopic examination reveals clear canals, tympanic membranes are intact bilaterally without bulging, retraction, inflammation or discharge. Nose:  External nasal examination shows no deformity or inflammation. Nasal mucosa are pink and moist without lesions or exudates. No septal dislocation or deviation.No obstruction to airflow.  Oral exam: Dental hygiene is good; lips and gums are healthy appearing.There is no oropharyngeal erythema or exudate noted.  Neck:  No deformities, thyromegaly, masses, or tenderness noted.    Heart:  Normal rate and regular rhythm. S1 and S2 normal without gallop, murmur, click, rub or other extra sounds.  Extremities:  No cyanosis,  edema, or clubbing  noted  Skin: Warm & dry w/o jaundice or tenting.       Assessment & Plan:  #1 flare of asthmatic bronchitis acute on chronic in the context of nonallergic rhinitis  Plan: See orders and recommendations

## 2014-04-30 ENCOUNTER — Encounter: Payer: Self-pay | Admitting: Internal Medicine

## 2014-05-01 ENCOUNTER — Other Ambulatory Visit: Payer: Self-pay | Admitting: Internal Medicine

## 2014-05-01 DIAGNOSIS — J4521 Mild intermittent asthma with (acute) exacerbation: Secondary | ICD-10-CM

## 2014-05-01 MED ORDER — FLUTICASONE FUROATE-VILANTEROL 100-25 MCG/INH IN AEPB: 1.0000 | INHALATION_SPRAY | Freq: Every day | RESPIRATORY_TRACT | Status: DC

## 2014-05-05 ENCOUNTER — Other Ambulatory Visit: Payer: Self-pay

## 2014-05-05 MED ORDER — METOPROLOL TARTRATE 25 MG PO TABS: 25.0000 mg | ORAL_TABLET | Freq: Two times a day (BID) | ORAL | Status: DC

## 2014-05-09 ENCOUNTER — Encounter: Payer: Self-pay | Admitting: Registered Nurse

## 2014-05-09 ENCOUNTER — Encounter: Payer: Medicare Other | Attending: Physical Medicine & Rehabilitation | Admitting: Registered Nurse

## 2014-05-09 VITALS — BP 139/78 | HR 67

## 2014-05-09 DIAGNOSIS — M4126 Other idiopathic scoliosis, lumbar region: Secondary | ICD-10-CM | POA: Insufficient documentation

## 2014-05-09 DIAGNOSIS — G894 Chronic pain syndrome: Secondary | ICD-10-CM

## 2014-05-09 DIAGNOSIS — M419 Scoliosis, unspecified: Secondary | ICD-10-CM

## 2014-05-09 DIAGNOSIS — M1611 Unilateral primary osteoarthritis, right hip: Secondary | ICD-10-CM | POA: Insufficient documentation

## 2014-05-09 DIAGNOSIS — M961 Postlaminectomy syndrome, not elsewhere classified: Secondary | ICD-10-CM | POA: Insufficient documentation

## 2014-05-09 DIAGNOSIS — Z5181 Encounter for therapeutic drug level monitoring: Secondary | ICD-10-CM

## 2014-05-09 DIAGNOSIS — M4806 Spinal stenosis, lumbar region: Secondary | ICD-10-CM | POA: Diagnosis not present

## 2014-05-09 DIAGNOSIS — Z79899 Other long term (current) drug therapy: Secondary | ICD-10-CM

## 2014-05-09 MED ORDER — HYDROCODONE-ACETAMINOPHEN 10-325 MG PO TABS: 1.0000 | ORAL_TABLET | Freq: Four times a day (QID) | ORAL | Status: DC | PRN

## 2014-05-09 NOTE — Progress Notes (Signed)
Subjective:    Patient ID: Hannah Scott, female    DOB: 01/30/1951, 64 y.o.   MRN: [id number]  HPI: Hannah Scott is a 64 year old female who returns for follow up for chronic pain and medication refill. She says her pain is located in her lower back. She rates her pain 6. Her current exercise regime is walking for short distances using her cadillac walker and performing stretching exercises occasionally.  Pain Inventory Average Pain 7 Pain Right Now 6 My pain is burning, tingling and aching  In the last 24 hours, has pain interfered with the following? General activity 10 Relation with others 10 Enjoyment of life 10 What TIME of day is your pain at its worst? all Sleep (in general) Poor  Pain is worse with: walking and standing Pain improves with: rest and medication Relief from Meds: 5  Mobility walk with assistance use a cane use a walker ability to climb steps?  yes do you drive?  yes  Function disabled: date disabled . I need assistance with the following:  household duties and shopping  Neuro/Psych weakness numbness tingling trouble walking  Prior Studies Any changes since last visit?  no  Physicians involved in your care Any changes since last visit?  no   Family History  Problem Relation Age of Onset  . Heart attack Father   . Heart disease Father   . Heart failure Mother   . Pneumonia Mother   . Hypertension Mother   . Heart disease Mother   . Stroke Maternal Grandfather   . Hypertension Maternal Grandfather   . Asthma Paternal Uncle     PAT UNCLES  . Heart attack Paternal Uncle    History   Social History  . Marital Status: Married    Spouse Name: N/A    Number of Children: N/A  . Years of Education: N/A   Social History Main Topics  . Smoking status: Never Smoker   . Smokeless tobacco: Never Used  . Alcohol Use: No  . Drug Use: No  . Sexual Activity: No   Other Topics Concern  . None   Social History Narrative   Past  Surgical History  Procedure Laterality Date  . Cataract extraction Bilateral 01,03  . Cardiovascular stress test  09/07/05    Nuclear, was negative  . Breast lumpectomy  2003    radiation on right  . Appendectomy  1987    [facility]  . Total abdominal hysterectomy  1987    LSO, APPENDECTOMY  . Pelvic laparoscopy  1989    RSO, LYSIS OF ADHESIONS  . Oophorectomy      LSO -RSO  . Back surgery      Fusion  . Ankle surgery Left     ligament  . Total hip arthroplasty Right 10/28/2013    dr Lorin Mercy  . Total hip arthroplasty Right 10/28/2013    Procedure: TOTAL HIP ARTHROPLASTY ANTERIOR APPROACH;  Surgeon: Marybelle Killings, MD;  Location: [place] [facility];  Service: Orthopedics;  Laterality: Right;  Right Total Hip Arthroplasty, Direct Anterior Approach   Past Medical History  Diagnosis Date  . Asthma     PFTs, February, 2011, moderate obstructive disease with response to bronchodilators, normal lung volumes, moderate reduction in diffusing capacity  . Obstructive airway disease   . Hyperlipidemia   . Hypertension   . Hypothyroidism     Patient has had in the past that she does not need treatment  . CAD (coronary  artery disease)     90% distal LAD in the past  /   nuclear, 2008, no ischemia, ejection fraction 70%  . Atrial septal aneurysm     Echo, 2008-not noted on 13 echo  . Pinched nerve     lower back  . Kyphoscoliosis   . Shortness of breath   . Endometriosis 1989    RIGHT TUBE  . Endometriosis 1987    LEFT TUBE/OVARY W FOCAL IN-SITU ENDOMETRIAL ADENOCARCINOMA  . Cancer 2002    DUCTAL CIS--S/P LUMPECTOMY, RADIATION AND 6 WEEKS OF TAMOXIFEN  . Ejection fraction     EF 60%, echo, October, 2008  . Elevated CPK     January, 2014  . D-dimer, elevated     January, 2014  . Depression   . UTI (lower urinary tract infection)   . GERD (gastroesophageal reflux disease)     occ  . H/O hiatal hernia     ?  [name] Arthritis   . Anemia     hx   BP 139/78 mmHg  Pulse 67  SpO2 97%  Opioid Risk  Score:   Fall Risk Score: Moderate Fall Risk (6-13 points) Review of Systems  Musculoskeletal: Positive for gait problem.  Neurological: Positive for weakness and numbness.       Tingling       Objective:   Physical Exam  Constitutional: She is oriented to person, place, and time. She appears well-developed and well-nourished.  HENT:  Head: Normocephalic and atraumatic.  Neck: Normal range of motion. Neck supple.  Cardiovascular: Normal rate and regular rhythm.   Pulmonary/Chest: Effort normal and breath sounds normal.  Musculoskeletal:  Normal Muscle Bulk and Muscle Testing Reveals: Upper Extremities: Full ROM and Muscle strength 5/5 Lumbar Paraspinal Tenderness: L-3- L-5 Lumbar Scoliosis Lower Extremities: Full ROM and Muscle strength 5/5 Arises from chair with ease Using Cadillac walker    Neurological: She is alert and oriented to person, place, and time.  Skin: Skin is warm and dry.  Psychiatric: She has a normal mood and affect.  Nursing note and vitals reviewed.         Assessment & Plan:  1 Lumbar post laminectomy syndrome, s/p lumbar fusion:  Refilled: Hydrocodone 10/325mg  one tablet every 8 hours #90 2. Right Hip OA: S/P Right Hip Replacement 10/28/2013. 3.Depression: On Zoloft. Has Family Support and Friends.  Counseling with Doristine Bosworth.  20 minutes of face to face patient care time was spent during this visit. All questions were encouraged and answered.

## 2014-06-03 ENCOUNTER — Other Ambulatory Visit: Payer: Self-pay | Admitting: Cardiology

## 2014-06-13 ENCOUNTER — Encounter: Payer: Self-pay | Admitting: Registered Nurse

## 2014-06-13 ENCOUNTER — Other Ambulatory Visit: Payer: Self-pay | Admitting: Physical Medicine & Rehabilitation

## 2014-06-13 ENCOUNTER — Encounter: Payer: Medicare Other | Attending: Physical Medicine & Rehabilitation | Admitting: Registered Nurse

## 2014-06-13 VITALS — BP 153/71 | HR 72 | Resp 14

## 2014-06-13 DIAGNOSIS — M4126 Other idiopathic scoliosis, lumbar region: Secondary | ICD-10-CM

## 2014-06-13 DIAGNOSIS — M4806 Spinal stenosis, lumbar region: Secondary | ICD-10-CM | POA: Diagnosis not present

## 2014-06-13 DIAGNOSIS — M419 Scoliosis, unspecified: Secondary | ICD-10-CM

## 2014-06-13 DIAGNOSIS — M961 Postlaminectomy syndrome, not elsewhere classified: Secondary | ICD-10-CM

## 2014-06-13 DIAGNOSIS — M1611 Unilateral primary osteoarthritis, right hip: Secondary | ICD-10-CM | POA: Diagnosis present

## 2014-06-13 DIAGNOSIS — G894 Chronic pain syndrome: Secondary | ICD-10-CM

## 2014-06-13 DIAGNOSIS — Z5181 Encounter for therapeutic drug level monitoring: Secondary | ICD-10-CM

## 2014-06-13 DIAGNOSIS — Z79899 Other long term (current) drug therapy: Secondary | ICD-10-CM

## 2014-06-13 MED ORDER — HYDROCODONE-ACETAMINOPHEN 10-325 MG PO TABS: 1.0000 | ORAL_TABLET | Freq: Four times a day (QID) | ORAL | Status: DC | PRN

## 2014-06-13 NOTE — Progress Notes (Signed)
Subjective:    Patient ID: Hannah Scott, female    DOB: [birth year], 64 y.o.   MRN: [id number]  HPI: Ms. EMELI GOGUEN is a 64 year old female who returns for follow up for chronic pain and medication refill. She says her pain is located in her lower back. She rates her pain 7. Her current exercise regime is walking for short distances using her cadillac walker.  Pain Inventory Average Pain 8 Pain Right Now 7 My pain is constant, burning, tingling and aching  In the last 24 hours, has pain interfered with the following? General activity 10 Relation with others 10 Enjoyment of life 10 What TIME of day is your pain at its worst? daytime, evening, night Sleep (in general) Poor  Pain is worse with: walking and standing Pain improves with: rest and medication Relief from Meds: 4  Mobility use a cane use a walker ability to climb steps?  yes do you drive?  yes  Function disabled: date disabled . I need assistance with the following:  household duties and shopping  Neuro/Psych weakness numbness tingling trouble walking  Prior Studies Any changes since last visit?  no  Physicians involved in your care Any changes since last visit?  no   Family History  Problem Relation Age of Onset  . Heart attack Father   . Heart disease Father   . Heart failure Mother   . Pneumonia Mother   . Hypertension Mother   . Heart disease Mother   . Stroke Maternal Grandfather   . Hypertension Maternal Grandfather   . Asthma Paternal Uncle     PAT UNCLES  . Heart attack Paternal Uncle    History   Social History  . Marital Status: Married    Spouse Name: N/A  . Number of Children: N/A  . Years of Education: N/A   Social History Main Topics  . Smoking status: Never Smoker   . Smokeless tobacco: Never Used  . Alcohol Use: No  . Drug Use: No  . Sexual Activity: No   Other Topics Concern  . None   Social History Narrative   Past Surgical History  Procedure Laterality Date    . Cataract extraction Bilateral 01,03  . Cardiovascular stress test  09/07/05    Nuclear, was negative  . Breast lumpectomy  2003    radiation on right  . Appendectomy  1987    [facility]  . Total abdominal hysterectomy  1987    LSO, APPENDECTOMY  . Pelvic laparoscopy  1989    RSO, LYSIS OF ADHESIONS  . Oophorectomy      LSO -RSO  . Back surgery      Fusion  . Ankle surgery Left     ligament  . Total hip arthroplasty Right 10/28/2013    dr Lorin Mercy  . Total hip arthroplasty Right 10/28/2013    Procedure: TOTAL HIP ARTHROPLASTY ANTERIOR APPROACH;  Surgeon: Marybelle Killings, MD;  Location: [facility];  Service: Orthopedics;  Laterality: Right;  Right Total Hip Arthroplasty, Direct Anterior Approach   Past Medical History  Diagnosis Date  . Asthma     PFTs, February, 2011, moderate obstructive disease with response to bronchodilators, normal lung volumes, moderate reduction in diffusing capacity  . Obstructive airway disease   . Hyperlipidemia   . Hypertension   . Hypothyroidism     Patient has had in the past that she does not need treatment  . CAD (coronary artery disease)  90% distal LAD in the past  /   nuclear, 2008, no ischemia, ejection fraction 70%  . Atrial septal aneurysm     Echo, 2008-not noted on 13 echo  . Pinched nerve     lower back  . Kyphoscoliosis   . Shortness of breath   . Endometriosis 1989    RIGHT TUBE  . Endometriosis 1987    LEFT TUBE/OVARY W FOCAL IN-SITU ENDOMETRIAL ADENOCARCINOMA  . Cancer 2002    DUCTAL CIS--S/P LUMPECTOMY, RADIATION AND 6 WEEKS OF TAMOXIFEN  . Ejection fraction     EF 60%, echo, October, 2008  . Elevated CPK     January, 2014  . D-dimer, elevated     January, 2014  . Depression   . UTI (lower urinary tract infection)   . GERD (gastroesophageal reflux disease)     occ  . H/O hiatal hernia     ?  [name] Arthritis   . Anemia     hx   BP 153/71 mmHg  Pulse 72  Resp 14  SpO2 96%  Opioid Risk Score:   Fall Risk Score: Low Fall Risk  (0-5 points) (patient previously educated) Review of Systems  Musculoskeletal: Positive for gait problem.  Neurological: Positive for weakness and numbness.       Tingling  All other systems reviewed and are negative.      Objective:   Physical Exam  Constitutional: She is oriented to person, place, and time. She appears well-developed and well-nourished.  HENT:  Head: Normocephalic and atraumatic.  Neck: Normal range of motion. Neck supple.  Cardiovascular: Normal rate and regular rhythm.   Pulmonary/Chest: Effort normal and breath sounds normal.  Musculoskeletal:  Normal Muscle Bulk and Muscle Testing Reveals: Upper Extremities: Full ROM and Muscle Strength 5/5 Lumbar Scoliosis Lumbar Paraspinal Tenderness: L-3- L-5 Lower Extremities: Full ROM and Muscle Strength 5/5 Right Lower Extremity Flexion Produces pain into Patella Arises from chair with ease/ Using cadillac walker for support Narrow Based Gait   Neurological: She is alert and oriented to person, place, and time.  Skin: Skin is warm and dry.  Psychiatric: She has a normal mood and affect.  Nursing note and vitals reviewed.         Assessment & Plan:  1 Lumbar post laminectomy syndrome, s/p lumbar fusion:  Refilled: Hydrocodone 10/325mg  one tablet every 8 hours #90 2. Right Hip OA: S/P Right Hip Replacement 10/28/2013. 3.Depression: On Zoloft. Has Family Support and Friends.  Counseling with Doristine Bosworth.  20 minutes of face to face patient care time was spent during this visit. All questions were encouraged and answered.  F/U in 1 month

## 2014-06-14 LAB — PMP ALCOHOL METABOLITE (ETG): Ethyl Glucuronide (EtG): NEGATIVE ng/mL

## 2014-06-16 LAB — OPIATES/OPIOIDS (LC/MS-MS)
Codeine Urine: NEGATIVE ng/mL (ref <50)
Hydrocodone: 3049 ng/mL (ref <50)
Hydromorphone: 231 ng/mL (ref <50)
Morphine Urine: NEGATIVE ng/mL (ref <50)
NOROXYCODONE, UR: NEGATIVE ng/mL (ref <50)
Norhydrocodone, Ur: 2934 ng/mL (ref <50)
Oxycodone, ur: NEGATIVE ng/mL (ref <50)
Oxymorphone: NEGATIVE ng/mL (ref <50)

## 2014-06-17 LAB — PRESCRIPTION MONITORING PROFILE (SOLSTAS)
AMPHETAMINE/METH: NEGATIVE ng/mL
BARBITURATE SCREEN, URINE: NEGATIVE ng/mL
Benzodiazepine Screen, Urine: NEGATIVE ng/mL
Buprenorphine, Urine: NEGATIVE ng/mL
CARISOPRODOL, URINE: NEGATIVE ng/mL
COCAINE METABOLITES: NEGATIVE ng/mL
CREATININE, URINE: 74.81 mg/dL (ref >20.0)
Cannabinoid Scrn, Ur: NEGATIVE ng/mL
Fentanyl, Ur: NEGATIVE ng/mL
MDMA URINE: NEGATIVE ng/mL
Meperidine, Ur: NEGATIVE ng/mL
Methadone Screen, Urine: NEGATIVE ng/mL
Nitrites, Initial: NEGATIVE ug/mL
OXYCODONE SCRN UR: NEGATIVE ng/mL
PH URINE, INITIAL: 5.5 pH (ref 4.5–8.9)
Propoxyphene: NEGATIVE ng/mL
TAPENTADOLUR: NEGATIVE ng/mL
TRAMADOL UR: NEGATIVE ng/mL
Zolpidem, Urine: NEGATIVE ng/mL

## 2014-06-27 ENCOUNTER — Other Ambulatory Visit: Payer: Self-pay | Admitting: Endocrinology

## 2014-07-09 NOTE — Progress Notes (Signed)
Urine drug screen for this encounter is consistent for prescribed medication 

## 2014-07-11 ENCOUNTER — Encounter: Payer: Self-pay | Admitting: Registered Nurse

## 2014-07-11 ENCOUNTER — Encounter: Payer: Medicare Other | Attending: Physical Medicine & Rehabilitation | Admitting: Registered Nurse

## 2014-07-11 VITALS — BP 143/51 | HR 65 | Resp 14

## 2014-07-11 DIAGNOSIS — G894 Chronic pain syndrome: Secondary | ICD-10-CM

## 2014-07-11 DIAGNOSIS — M4806 Spinal stenosis, lumbar region: Secondary | ICD-10-CM | POA: Insufficient documentation

## 2014-07-11 DIAGNOSIS — M1611 Unilateral primary osteoarthritis, right hip: Secondary | ICD-10-CM | POA: Insufficient documentation

## 2014-07-11 DIAGNOSIS — M419 Scoliosis, unspecified: Secondary | ICD-10-CM

## 2014-07-11 DIAGNOSIS — M4126 Other idiopathic scoliosis, lumbar region: Secondary | ICD-10-CM | POA: Insufficient documentation

## 2014-07-11 DIAGNOSIS — Z5181 Encounter for therapeutic drug level monitoring: Secondary | ICD-10-CM

## 2014-07-11 DIAGNOSIS — M961 Postlaminectomy syndrome, not elsewhere classified: Secondary | ICD-10-CM | POA: Insufficient documentation

## 2014-07-11 DIAGNOSIS — Z79899 Other long term (current) drug therapy: Secondary | ICD-10-CM

## 2014-07-11 MED ORDER — HYDROCODONE-ACETAMINOPHEN 10-325 MG PO TABS: 1.0000 | ORAL_TABLET | Freq: Four times a day (QID) | ORAL | Status: DC | PRN

## 2014-07-11 NOTE — Progress Notes (Signed)
Subjective:    Patient ID: Hannah Scott, female    DOB: 01-22-1951, 64 y.o.   MRN: [id number]  HPI: [name] is a 64 year old female who returns for follow up for chronic pain and medication refill. She says her pain is located in her lower back and right hip. She rates her pain 7. Her current exercise regime is walking for short distances using her cadillac walker.  Pain Inventory Average Pain 8 Pain Right Now 7 My pain is constant, burning, tingling and aching  In the last 24 hours, has pain interfered with the following? General activity 10 Relation with others 10 Enjoyment of life 10 What TIME of day is your pain at its worst? all Sleep (in general) Good  Pain is worse with: walking and standing Pain improves with: rest and medication Relief from Meds: 6  Mobility use a cane use a walker ability to climb steps?  yes do you drive?  yes  Function disabled: date disabled 2008 I need assistance with the following:  household duties and shopping  Neuro/Psych weakness numbness tingling trouble walking depression  Prior Studies Any changes since last visit?  no  Physicians involved in your care Any changes since last visit?  no   Family History  Problem Relation Age of Onset  . Heart attack Father   . Heart disease Father   . Heart failure Mother   . Pneumonia Mother   . Hypertension Mother   . Heart disease Mother   . Stroke Maternal Grandfather   . Hypertension Maternal Grandfather   . Asthma Paternal Uncle     PAT UNCLES  . Heart attack Paternal Uncle    History   Social History  . Marital Status: Married    Spouse Name: N/A  . Number of Children: N/A  . Years of Education: N/A   Social History Main Topics  . Smoking status: Never Smoker   . Smokeless tobacco: Never Used  . Alcohol Use: No  . Drug Use: No  . Sexual Activity: No   Other Topics Concern  . None   Social History Narrative   Past Surgical History  Procedure  Laterality Date  . Cataract extraction Bilateral 01,03  . Cardiovascular stress test  09/07/05    Nuclear, was negative  . Breast lumpectomy  2003    radiation on right  . Appendectomy  1987    AT [facility]  . Total abdominal hysterectomy  1987    LSO, APPENDECTOMY  . Pelvic laparoscopy  1989    RSO, LYSIS OF ADHESIONS  . Oophorectomy      LSO -RSO  . Back surgery      Fusion  . Ankle surgery Left     ligament  . Total hip arthroplasty Right 10/28/2013    dr Lorin Mercy  . Total hip arthroplasty Right 10/28/2013    Procedure: TOTAL HIP ARTHROPLASTY ANTERIOR APPROACH;  Surgeon: Marybelle Killings, MD;  Location: [facility];  Service: Orthopedics;  Laterality: Right;  Right Total Hip Arthroplasty, Direct Anterior Approach   Past Medical History  Diagnosis Date  . Asthma     PFTs, February, 2011, moderate obstructive disease with response to bronchodilators, normal lung volumes, moderate reduction in diffusing capacity  . Obstructive airway disease   . Hyperlipidemia   . Hypertension   . Hypothyroidism     Patient has had in the past that she does not need treatment  . CAD (coronary artery disease)  90% distal LAD in the past  /   nuclear, 2008, no ischemia, ejection fraction 70%  . Atrial septal aneurysm     Echo, 2008-not noted on 13 echo  . Pinched nerve     lower back  . Kyphoscoliosis   . Shortness of breath   . Endometriosis 1989    RIGHT TUBE  . Endometriosis 1987    LEFT TUBE/OVARY W FOCAL IN-SITU ENDOMETRIAL ADENOCARCINOMA  . Cancer 2002    DUCTAL CIS--S/P LUMPECTOMY, RADIATION AND 6 WEEKS OF TAMOXIFEN  . Ejection fraction     EF 60%, echo, October, 2008  . Elevated CPK     January, 2014  . D-dimer, elevated     January, 2014  . Depression   . UTI (lower urinary tract infection)   . GERD (gastroesophageal reflux disease)     occ  . H/O hiatal hernia     ?  [name] Arthritis   . Anemia     hx   BP 155/55 mmHg  Pulse 57  Resp 14  SpO2 97%  Opioid Risk Score:   Fall Risk  Score: Low Fall Risk (0-5 points) (previously educated and given handout)`1  Depression screen PHQ 2/9  Depression screen PHQ 2/9 07/11/2014  Decreased Interest 3  Down, Depressed, Hopeless 2  PHQ - 2 Score 5  Altered sleeping 3  Tired, decreased energy 3  Change in appetite 0  Feeling bad or failure about yourself  0  Trouble concentrating 0  Moving slowly or fidgety/restless 0  Suicidal thoughts 0  PHQ-9 Score 11    Review of Systems  Musculoskeletal: Positive for gait problem.  Neurological: Positive for weakness and numbness.       Tingling  Psychiatric/Behavioral: Positive for dysphoric mood.  All other systems reviewed and are negative.      Objective:   Physical Exam  Constitutional: She is oriented to person, place, and time. She appears well-developed and well-nourished.  HENT:  Head: Normocephalic and atraumatic.  Neck: Normal range of motion. Neck supple.  Cardiovascular: Normal rate and regular rhythm.   Pulmonary/Chest: Effort normal and breath sounds normal.  Musculoskeletal:  Normal Muscle Bulk and Muscle Testing Reveals: Upper Extremities: Full ROM and Muscle strength 5/5 Lumbar Scoliosis Lumbar Paraspinal Tenderness: L-3- L-5 Lower Extremities: Full ROM and Muscle Strength 5/5 Right Lower Extremity Flexion Produces Pain into Right Hip Arises from chair slowly, using cadillac walker Wide Based gait    Neurological: She is alert and oriented to person, place, and time.  Skin: Skin is warm and dry.  Psychiatric: She has a normal mood and affect.  Nursing note and vitals reviewed.         Assessment & Plan:  1 Lumbar post laminectomy syndrome, s/p lumbar fusion:  Refilled: Hydrocodone 10/325mg  one tablet every 8 hours #90 2. Right Hip OA: S/P Right Hip Replacement 10/28/2013. 3.Depression: On Zoloft. Has Family Support and Friends.  Counseling with Doristine Bosworth.  20 minutes of face to face patient care time was spent during this visit. All  questions were encouraged and answered.  F/U in 1 month

## 2014-07-15 ENCOUNTER — Encounter: Payer: Self-pay | Admitting: Registered Nurse

## 2014-07-16 ENCOUNTER — Other Ambulatory Visit: Payer: Self-pay

## 2014-07-16 MED ORDER — SERTRALINE HCL 50 MG PO TABS: 50.0000 mg | ORAL_TABLET | Freq: Every day | ORAL | Status: DC

## 2014-07-16 NOTE — Telephone Encounter (Signed)
OK X 3 mos 

## 2014-08-08 ENCOUNTER — Other Ambulatory Visit: Payer: Self-pay

## 2014-08-08 ENCOUNTER — Other Ambulatory Visit: Payer: Self-pay | Admitting: *Deleted

## 2014-08-08 MED ORDER — BENAZEPRIL HCL 40 MG PO TABS: ORAL_TABLET | ORAL | Status: DC

## 2014-08-08 MED ORDER — GABAPENTIN 300 MG PO CAPS: 300.0000 mg | ORAL_CAPSULE | Freq: Every day | ORAL | Status: DC

## 2014-08-08 NOTE — Telephone Encounter (Signed)
Recd fax refill request for Gabapentin 300 mg capsules - take one cap QD at bedtime.  Sent in electronically

## 2014-08-11 ENCOUNTER — Encounter: Payer: Medicare Other | Attending: Physical Medicine & Rehabilitation | Admitting: Registered Nurse

## 2014-08-11 ENCOUNTER — Encounter: Payer: Self-pay | Admitting: Registered Nurse

## 2014-08-11 VITALS — BP 138/66 | HR 85 | Resp 16

## 2014-08-11 DIAGNOSIS — M1611 Unilateral primary osteoarthritis, right hip: Secondary | ICD-10-CM

## 2014-08-11 DIAGNOSIS — G894 Chronic pain syndrome: Secondary | ICD-10-CM | POA: Diagnosis not present

## 2014-08-11 DIAGNOSIS — M419 Scoliosis, unspecified: Secondary | ICD-10-CM

## 2014-08-11 DIAGNOSIS — Z79899 Other long term (current) drug therapy: Secondary | ICD-10-CM

## 2014-08-11 DIAGNOSIS — M4806 Spinal stenosis, lumbar region: Secondary | ICD-10-CM | POA: Insufficient documentation

## 2014-08-11 DIAGNOSIS — Z5181 Encounter for therapeutic drug level monitoring: Secondary | ICD-10-CM

## 2014-08-11 DIAGNOSIS — M4126 Other idiopathic scoliosis, lumbar region: Secondary | ICD-10-CM | POA: Diagnosis not present

## 2014-08-11 DIAGNOSIS — M961 Postlaminectomy syndrome, not elsewhere classified: Secondary | ICD-10-CM | POA: Insufficient documentation

## 2014-08-11 MED ORDER — HYDROCODONE-ACETAMINOPHEN 10-325 MG PO TABS: 1.0000 | ORAL_TABLET | Freq: Four times a day (QID) | ORAL | Status: DC | PRN

## 2014-08-11 NOTE — Progress Notes (Signed)
Subjective:    Patient ID: Hannah Scott, female    DOB: Oct 24, 1950, 64 y.o.   MRN: [id number]  HPI: [name] is a 64 year old female who returns for follow up for chronic pain and medication refill. She says her pain is located in her lower back and right hip. She rates her pain 6. Her current exercise regime is walking for short distances using her cadillac walker.  Pain Inventory Average Pain 7 Pain Right Now 6 My pain is constant, burning, tingling and aching  In the last 24 hours, has pain interfered with the following? General activity 10 Relation with others 10 Enjoyment of life 10 What TIME of day is your pain at its worst? all Sleep (in general) NA  Pain is worse with: walking and standing Pain improves with: rest and medication Relief from Meds: 6  Mobility use a cane use a walker ability to climb steps?  yes do you drive?  yes  Function disabled: date disabled . I need assistance with the following:  household duties  Neuro/Psych weakness numbness tingling trouble walking  Prior Studies Any changes since last visit?  no  Physicians involved in your care Any changes since last visit?  no   Family History  Problem Relation Age of Onset  . Heart attack Father   . Heart disease Father   . Heart failure Mother   . Pneumonia Mother   . Hypertension Mother   . Heart disease Mother   . Stroke Maternal Grandfather   . Hypertension Maternal Grandfather   . Asthma Paternal Uncle     PAT UNCLES  . Heart attack Paternal Uncle    History   Social History  . Marital Status: Married    Spouse Name: N/A  . Number of Children: N/A  . Years of Education: N/A   Social History Main Topics  . Smoking status: Never Smoker   . Smokeless tobacco: Never Used  . Alcohol Use: No  . Drug Use: No  . Sexual Activity: No   Other Topics Concern  . None   Social History Narrative   Past Surgical History  Procedure Laterality Date  . Cataract extraction  Bilateral 01,03  . Cardiovascular stress test  09/07/05    Nuclear, was negative  . Breast lumpectomy  2003    radiation on right  . Appendectomy  1987    AT [facility]  . Total abdominal hysterectomy  1987    LSO, APPENDECTOMY  . Pelvic laparoscopy  1989    RSO, LYSIS OF ADHESIONS  . Oophorectomy      LSO -RSO  . Back surgery      Fusion  . Ankle surgery Left     ligament  . Total hip arthroplasty Right 10/28/2013    dr Lorin Mercy  . Total hip arthroplasty Right 10/28/2013    Procedure: TOTAL HIP ARTHROPLASTY ANTERIOR APPROACH;  Surgeon: Marybelle Killings, MD;  Location: [facility];  Service: Orthopedics;  Laterality: Right;  Right Total Hip Arthroplasty, Direct Anterior Approach   Past Medical History  Diagnosis Date  . Asthma     PFTs, February, 2011, moderate obstructive disease with response to bronchodilators, normal lung volumes, moderate reduction in diffusing capacity  . Obstructive airway disease   . Hyperlipidemia   . Hypertension   . Hypothyroidism     Patient has had in the past that she does not need treatment  . CAD (coronary artery disease)     90%  distal LAD in the past  /   nuclear, 2008, no ischemia, ejection fraction 70%  . Atrial septal aneurysm     Echo, 2008-not noted on 13 echo  . Pinched nerve     lower back  . Kyphoscoliosis   . Shortness of breath   . Endometriosis 1989    RIGHT TUBE  . Endometriosis 1987    LEFT TUBE/OVARY W FOCAL IN-SITU ENDOMETRIAL ADENOCARCINOMA  . Cancer 2002    DUCTAL CIS--S/P LUMPECTOMY, RADIATION AND 6 WEEKS OF TAMOXIFEN  . Ejection fraction     EF 60%, echo, October, 2008  . Elevated CPK     January, 2014  . D-dimer, elevated     January, 2014  . Depression   . UTI (lower urinary tract infection)   . GERD (gastroesophageal reflux disease)     occ  . H/O hiatal hernia     ?  [name] Arthritis   . Anemia     hx   BP 138/66 mmHg  Pulse 85  Resp 16  SpO2 95%  Opioid Risk Score:   Fall Risk Score: Moderate Fall Risk (6-13  points)`1  Depression screen PHQ 2/9  Depression screen PHQ 2/9 07/11/2014  Decreased Interest 3  Down, Depressed, Hopeless 2  PHQ - 2 Score 5  Altered sleeping 3  Tired, decreased energy 3  Change in appetite 0  Feeling bad or failure about yourself  0  Trouble concentrating 0  Moving slowly or fidgety/restless 0  Suicidal thoughts 0  PHQ-9 Score 11    Review of Systems  Musculoskeletal: Positive for gait problem.  Neurological: Positive for weakness and numbness.       Tingling  All other systems reviewed and are negative.      Objective:   Physical Exam  Constitutional: She is oriented to person, place, and time. She appears well-developed and well-nourished.  HENT:  Head: Normocephalic and atraumatic.  Neck: Normal range of motion. Neck supple.  Cardiovascular: Normal rate and regular rhythm.   Pulmonary/Chest: Effort normal and breath sounds normal.  Musculoskeletal:  Normal Muscle Bulk and Muscle Testing Reveals: Upper Extremities: Full ROM and Muscle Strength 5/5 Lumbar Scoliosis Lumbar Paraspinal Tenderness: L-3- L-5 Right Greater Trochanteric Tenderness Lower Extremities: Full ROM and Muscle Strength 5/5 Right Lower Extremity Flexion Produces Pain into Right Hip Arises from chair with ease Narrow Based gait  Neurological: She is alert and oriented to person, place, and time.  Skin: Skin is warm and dry.  Psychiatric: She has a normal mood and affect.  Nursing note and vitals reviewed.         Assessment & Plan:  1 Lumbar post laminectomy syndrome, s/p lumbar fusion:  Refilled: Hydrocodone 10/325mg  one tablet every 8 hours #90 2. Right Hip OA: S/P Right Hip Replacement 10/28/2013. 3.Depression: On Zoloft. Has Family Support and Friends. Counseling with Doristine Bosworth. 4. Right Greater Trochanteric Tenderness: Continue with Ice and Heat Therapy.    20 minutes of face to face patient care time was spent during this visit. All questions were encouraged and  answered.  F/U in 1 month

## 2014-08-22 ENCOUNTER — Other Ambulatory Visit: Payer: Self-pay | Admitting: Endocrinology

## 2014-09-03 ENCOUNTER — Encounter: Payer: Self-pay | Admitting: Internal Medicine

## 2014-09-09 ENCOUNTER — Encounter: Payer: Medicare Other | Attending: Physical Medicine & Rehabilitation

## 2014-09-09 ENCOUNTER — Encounter: Payer: Self-pay | Admitting: Physical Medicine & Rehabilitation

## 2014-09-09 ENCOUNTER — Other Ambulatory Visit: Payer: Self-pay | Admitting: Physical Medicine & Rehabilitation

## 2014-09-09 ENCOUNTER — Ambulatory Visit (HOSPITAL_BASED_OUTPATIENT_CLINIC_OR_DEPARTMENT_OTHER): Payer: Medicare Other | Admitting: Physical Medicine & Rehabilitation

## 2014-09-09 VITALS — BP 186/96 | HR 70 | Resp 14

## 2014-09-09 DIAGNOSIS — M4126 Other idiopathic scoliosis, lumbar region: Secondary | ICD-10-CM | POA: Diagnosis not present

## 2014-09-09 DIAGNOSIS — Z79899 Other long term (current) drug therapy: Secondary | ICD-10-CM | POA: Diagnosis not present

## 2014-09-09 DIAGNOSIS — Z5181 Encounter for therapeutic drug level monitoring: Secondary | ICD-10-CM | POA: Diagnosis not present

## 2014-09-09 DIAGNOSIS — M961 Postlaminectomy syndrome, not elsewhere classified: Secondary | ICD-10-CM | POA: Diagnosis not present

## 2014-09-09 DIAGNOSIS — M419 Scoliosis, unspecified: Secondary | ICD-10-CM

## 2014-09-09 DIAGNOSIS — M4806 Spinal stenosis, lumbar region: Secondary | ICD-10-CM | POA: Diagnosis not present

## 2014-09-09 DIAGNOSIS — M24551 Contracture, right hip: Secondary | ICD-10-CM

## 2014-09-09 DIAGNOSIS — M48062 Spinal stenosis, lumbar region with neurogenic claudication: Secondary | ICD-10-CM

## 2014-09-09 DIAGNOSIS — M1611 Unilateral primary osteoarthritis, right hip: Secondary | ICD-10-CM | POA: Diagnosis present

## 2014-09-09 MED ORDER — HYDROCODONE-ACETAMINOPHEN 10-325 MG PO TABS: 1.0000 | ORAL_TABLET | Freq: Four times a day (QID) | ORAL | Status: DC | PRN

## 2014-09-09 MED ORDER — GABAPENTIN 300 MG PO CAPS: 300.0000 mg | ORAL_CAPSULE | Freq: Every day | ORAL | Status: DC

## 2014-09-09 NOTE — Patient Instructions (Signed)
Continue current medicines.

## 2014-09-09 NOTE — Progress Notes (Signed)
Subjective:    Patient ID: Hannah Scott, female    DOB: 1950/12/05, 64 y.o.   MRN: [id number]  HPI Does dressing bathing Mod I Needs help with vacuum, light housework Now able to place foot flat on the right side Pain Inventory Average Pain 7 Pain Right Now 6 My pain is intermittent, burning, tingling and aching  In the last 24 hours, has pain interfered with the following? General activity 10 Relation with others 10 Enjoyment of life 10 What TIME of day is your pain at its worst? ALL Sleep (in general) Poor  Pain is worse with: walking, standing and some activites Pain improves with: rest and medication Relief from Meds: 5  Mobility walk without assistance use a walker how many minutes can you walk? 5 ability to climb steps?  yes do you drive?  yes  Function retired  Neuro/Psych weakness numbness trouble walking  Prior Studies Any changes since last visit?  no  Physicians involved in your care Any changes since last visit?  no   Family History  Problem Relation Age of Onset  . Heart attack Father   . Heart disease Father   . Heart failure Mother   . Pneumonia Mother   . Hypertension Mother   . Heart disease Mother   . Stroke Maternal Grandfather   . Hypertension Maternal Grandfather   . Asthma Paternal Uncle     PAT UNCLES  . Heart attack Paternal Uncle    History   Social History  . Marital Status: Married    Spouse Name: N/A  . Number of Children: N/A  . Years of Education: N/A   Social History Main Topics  . Smoking status: Never Smoker   . Smokeless tobacco: Never Used  . Alcohol Use: No  . Drug Use: No  . Sexual Activity: No   Other Topics Concern  . None   Social History Narrative   Past Surgical History  Procedure Laterality Date  . Cataract extraction Bilateral 01,03  . Cardiovascular stress test  09/07/05    Nuclear, was negative  . Breast lumpectomy  2003    radiation on right  . Appendectomy  1987    AT [facility]  . Total  abdominal hysterectomy  1987    LSO, APPENDECTOMY  . Pelvic laparoscopy  1989    RSO, LYSIS OF ADHESIONS  . Oophorectomy      LSO -RSO  . Back surgery      Fusion  . Ankle surgery Left     ligament  . Total hip arthroplasty Right 10/28/2013    dr Lorin Mercy  . Total hip arthroplasty Right 10/28/2013    Procedure: TOTAL HIP ARTHROPLASTY ANTERIOR APPROACH;  Surgeon: Marybelle Killings, MD;  Location: [facility];  Service: Orthopedics;  Laterality: Right;  Right Total Hip Arthroplasty, Direct Anterior Approach   Past Medical History  Diagnosis Date  . Asthma     PFTs, February, 2011, moderate obstructive disease with response to bronchodilators, normal lung volumes, moderate reduction in diffusing capacity  . Obstructive airway disease   . Hyperlipidemia   . Hypertension   . Hypothyroidism     Patient has had in the past that she does not need treatment  . CAD (coronary artery disease)     90% distal LAD in the past  /   nuclear, 2008, no ischemia, ejection fraction 70%  . Atrial septal aneurysm     Echo, 2008-not noted on 13 echo  . Pinched nerve  lower back  . Kyphoscoliosis   . Shortness of breath   . Endometriosis 1989    RIGHT TUBE  . Endometriosis 1987    LEFT TUBE/OVARY W FOCAL IN-SITU ENDOMETRIAL ADENOCARCINOMA  . Cancer 2002    DUCTAL CIS--S/P LUMPECTOMY, RADIATION AND 6 WEEKS OF TAMOXIFEN  . Ejection fraction     EF 60%, echo, October, 2008  . Elevated CPK     January, 2014  . D-dimer, elevated     January, 2014  . Depression   . UTI (lower urinary tract infection)   . GERD (gastroesophageal reflux disease)     occ  . H/O hiatal hernia     ?  [name] Arthritis   . Anemia     hx   BP 186/96 mmHg  Pulse 70  Resp 14  SpO2 97%  Opioid Risk Score:   Fall Risk Score: Low Fall Risk (0-5 points)`1  Depression screen PHQ 2/9  Depression screen PHQ 2/9 07/11/2014  Decreased Interest 3  Down, Depressed, Hopeless 2  PHQ - 2 Score 5  Altered sleeping 3  Tired, decreased energy  3  Change in appetite 0  Feeling bad or failure about yourself  0  Trouble concentrating 0  Moving slowly or fidgety/restless 0  Suicidal thoughts 0  PHQ-9 Score 11     Review of Systems  HENT: Negative.   Eyes: Negative.   Respiratory: Negative.   Cardiovascular: Negative.   Gastrointestinal: Negative.   Endocrine: Negative.   Genitourinary: Negative.   Musculoskeletal: Positive for myalgias, back pain and arthralgias.       Right hand/arm numbness  Skin: Negative.   Allergic/Immunologic: Negative.   Neurological: Positive for weakness and numbness.       Trouble walking  Hematological: Negative.   Psychiatric/Behavioral: Negative.        Objective:   Physical Exam  Constitutional: She is oriented to person, place, and time.  Neurological: She is alert and oriented to person, place, and time. She has normal strength.  Psychiatric: She has a normal mood and affect.  Nursing note and vitals reviewed.   Patient with a forward flexed posture, able to stand with foot flat on the right side. Lumbar spine has S-shaped scoliosis thoracic lumbar area convex right No tenderness to palpation along the lumbosacral junction. Mood and affect are appropriate Lower extremity strength is normal. Does have a mild left hip flexion contracture      Assessment & Plan:  1. Thoracic lumbar scoliosis with chronic pain, patient has lumbar postlaminectomy syndrome as well. She is tolerating current medication dosing. No signs of aberrant drug behavior  Continue hydrocodone 10 mg 3 times a day Continue gabapentin 3 mg daily at bedtime  Continue opioid monitoring program. This consists of regular clinic visits, examinations, urine drug screen, pill counts as well as use of [place] controlled substance reporting System.

## 2014-09-10 LAB — PMP ALCOHOL METABOLITE (ETG): ETGU: NEGATIVE ng/mL

## 2014-09-13 LAB — OPIATES/OPIOIDS (LC/MS-MS)
CODEINE URINE: NEGATIVE ng/mL (ref <50)
HYDROCODONE: 6505 ng/mL (ref <50)
Hydromorphone: 288 ng/mL (ref <50)
Morphine Urine: NEGATIVE ng/mL (ref <50)
NORHYDROCODONE, UR: 5508 ng/mL (ref <50)
Noroxycodone, Ur: NEGATIVE ng/mL (ref <50)
Oxycodone, ur: NEGATIVE ng/mL (ref <50)
Oxymorphone: NEGATIVE ng/mL (ref <50)

## 2014-09-16 LAB — PRESCRIPTION MONITORING PROFILE (SOLSTAS)
Amphetamine/Meth: NEGATIVE ng/mL
BARBITURATE SCREEN, URINE: NEGATIVE ng/mL
Benzodiazepine Screen, Urine: NEGATIVE ng/mL
Buprenorphine, Urine: NEGATIVE ng/mL
CANNABINOID SCRN UR: NEGATIVE ng/mL
CREATININE, URINE: 126.78 mg/dL (ref >20.0)
Carisoprodol, Urine: NEGATIVE ng/mL
Cocaine Metabolites: NEGATIVE ng/mL
Fentanyl, Ur: NEGATIVE ng/mL
MDMA URINE: NEGATIVE ng/mL
METHADONE SCREEN, URINE: NEGATIVE ng/mL
Meperidine, Ur: NEGATIVE ng/mL
Nitrites, Initial: NEGATIVE ug/mL
Oxycodone Screen, Ur: NEGATIVE ng/mL
PH URINE, INITIAL: 5.7 pH (ref 4.5–8.9)
PROPOXYPHENE: NEGATIVE ng/mL
TAPENTADOLUR: NEGATIVE ng/mL
Tramadol Scrn, Ur: NEGATIVE ng/mL
Zolpidem, Urine: NEGATIVE ng/mL

## 2014-09-17 ENCOUNTER — Ambulatory Visit (INDEPENDENT_AMBULATORY_CARE_PROVIDER_SITE_OTHER): Payer: Medicare Other | Admitting: Endocrinology

## 2014-09-17 ENCOUNTER — Encounter: Payer: Self-pay | Admitting: Endocrinology

## 2014-09-17 VITALS — BP 132/80 | HR 76 | Temp 97.9°F | Ht 60.0 in | Wt 162.0 lb

## 2014-09-17 DIAGNOSIS — D500 Iron deficiency anemia secondary to blood loss (chronic): Secondary | ICD-10-CM

## 2014-09-17 DIAGNOSIS — E890 Postprocedural hypothyroidism: Secondary | ICD-10-CM | POA: Diagnosis not present

## 2014-09-17 LAB — CBC WITH DIFFERENTIAL/PLATELET
Basophils Absolute: 0 10*3/uL (ref 0.0–0.1)
Basophils Relative: 0.7 % (ref 0.0–3.0)
Eosinophils Absolute: 0.6 10*3/uL (ref 0.0–0.7)
Eosinophils Relative: 11.3 % — ABNORMAL HIGH (ref 0.0–5.0)
HCT: 36.6 % (ref 36.0–46.0)
Hemoglobin: 11.9 g/dL — ABNORMAL LOW (ref 12.0–15.0)
LYMPHS ABS: 1.1 10*3/uL (ref 0.7–4.0)
LYMPHS PCT: 21.9 % (ref 12.0–46.0)
MCHC: 32.6 g/dL (ref 30.0–36.0)
MCV: 83.6 fl (ref 78.0–100.0)
Monocytes Absolute: 0.5 10*3/uL (ref 0.1–1.0)
Monocytes Relative: 9.2 % (ref 3.0–12.0)
NEUTROS ABS: 2.9 10*3/uL (ref 1.4–7.7)
Neutrophils Relative %: 56.9 % (ref 43.0–77.0)
Platelets: 277 10*3/uL (ref 150.0–400.0)
RBC: 4.38 Mil/uL (ref 3.87–5.11)
RDW: 17.4 % — ABNORMAL HIGH (ref 11.5–15.5)
WBC: 5.1 10*3/uL (ref 4.0–10.5)

## 2014-09-17 LAB — TSH: TSH: 0.2 u[IU]/mL — AB (ref 0.35–4.50)

## 2014-09-17 LAB — IBC PANEL
IRON: 37 ug/dL — AB (ref 42–145)
SATURATION RATIOS: 8.2 % — AB (ref 20.0–50.0)
Transferrin: 321 mg/dL (ref 212.0–360.0)

## 2014-09-17 LAB — T4, FREE: Free T4: 1.2 ng/dL (ref 0.60–1.60)

## 2014-09-17 MED ORDER — LEVOTHYROXINE SODIUM 50 MCG PO TABS: 50.0000 ug | ORAL_TABLET | Freq: Every day | ORAL | Status: DC

## 2014-09-17 NOTE — Patient Instructions (Addendum)
blood tests are requested for you today.  We'll let you know about the results. Please come back for a follow-up appointment in 6 months.    here are some tests for blood in the bowels.  please follow the instructions, and return to the lab.   Most of the time, your thyroid will start working more or less than it is now.  This means that your need for the levothyroxine will go up or down with time, bult seldom stays the same.

## 2014-09-17 NOTE — Progress Notes (Signed)
Subjective:    Patient ID: Hannah Scott, female    DOB: Oct 27, 1950, 64 y.o.   MRN: [id number]  HPI Pt returns for f/u of post-I-131 hypothyroidism (she had i-131 rx for hyperthyroidism, due to multinodular goiter, in September, 2013; in early 2014, TSH was high, and synthroid was started; f/u US in late 2015 showed the goiter was smaller). She does not notice the goiter.  pt states she feels well in general, except for fatigue.   Postop anemia was noted in 2015: she denies BRBPR.   Past Medical History  Diagnosis Date  . Asthma     PFTs, February, 2011, moderate obstructive disease with response to bronchodilators, normal lung volumes, moderate reduction in diffusing capacity  . Obstructive airway disease   . Hyperlipidemia   . Hypertension   . Hypothyroidism     Patient has had in the past that she does not need treatment  . CAD (coronary artery disease)     90% distal LAD in the past  /   nuclear, 2008, no ischemia, ejection fraction 70%  . Atrial septal aneurysm     Echo, 2008-not noted on 13 echo  . Pinched nerve     lower back  . Kyphoscoliosis   . Shortness of breath   . Endometriosis 1989    RIGHT TUBE  . Endometriosis 1987    LEFT TUBE/OVARY W FOCAL IN-SITU ENDOMETRIAL ADENOCARCINOMA  . Cancer 2002    DUCTAL CIS--S/P LUMPECTOMY, RADIATION AND 6 WEEKS OF TAMOXIFEN  . Ejection fraction     EF 60%, echo, October, 2008  . Elevated CPK     January, 2014  . D-dimer, elevated     January, 2014  . Depression   . UTI (lower urinary tract infection)   . GERD (gastroesophageal reflux disease)     occ  . H/O hiatal hernia     ?  [name] Arthritis   . Anemia     hx    Past Surgical History  Procedure Laterality Date  . Cataract extraction Bilateral 01,03  . Cardiovascular stress test  09/07/05    Nuclear, was negative  . Breast lumpectomy  2003    radiation on right  . Appendectomy  1987    AT [facility]  . Total abdominal hysterectomy  1987    LSO, APPENDECTOMY  . Pelvic  laparoscopy  1989    RSO, LYSIS OF ADHESIONS  . Oophorectomy      LSO -RSO  . Back surgery      Fusion  . Ankle surgery Left     ligament  . Total hip arthroplasty Right 10/28/2013    dr Lorin Mercy  . Total hip arthroplasty Right 10/28/2013    Procedure: TOTAL HIP ARTHROPLASTY ANTERIOR APPROACH;  Surgeon: Marybelle Killings, MD;  Location: [facility];  Service: Orthopedics;  Laterality: Right;  Right Total Hip Arthroplasty, Direct Anterior Approach    History   Social History  . Marital Status: Married    Spouse Name: N/A  . Number of Children: N/A  . Years of Education: N/A   Occupational History  . Not on file.   Social History Main Topics  . Smoking status: Never Smoker   . Smokeless tobacco: Never Used  . Alcohol Use: No  . Drug Use: No  . Sexual Activity: No   Other Topics Concern  . Not on file   Social History Narrative    Current Outpatient Prescriptions on File Prior to Visit  Medication Sig Dispense  Refill  . albuterol (PROAIR HFA) 108 (90 BASE) MCG/ACT inhaler Inhale 1-2 puffs into the lungs every 6 (six) hours as needed. 1 Inhaler 6  . aspirin EC 325 MG tablet Take 1 tablet (325 mg total) by mouth daily. 30 tablet 0  . atorvastatin (LIPITOR) 40 MG tablet TAKE ONE TABLET BY MOUTH AT BEDTIME 90 tablet 3  . benazepril (LOTENSIN) 40 MG tablet Take 1/2 tablet by mouth daily 30 tablet 5  . Fluticasone Furoate-Vilanterol 100-25 MCG/INH AEPB Inhale 1 puff into the lungs daily. 60 each 5  . gabapentin (NEURONTIN) 300 MG capsule Take 1 capsule (300 mg total) by mouth at bedtime. 30 capsule 5  . HYDROcodone-acetaminophen (NORCO) 10-325 MG per tablet Take 1 tablet by mouth every 6 (six) hours as needed. 90 tablet 0  . ipratropium-albuterol (DUONEB) 0.5-2.5 (3) MG/3ML SOLN Take 3 mLs by nebulization every 4 (four) hours as needed. Must not be qid unless during exacerbation. Dx 493.90 360 mL 2  . metoprolol tartrate (LOPRESSOR) 25 MG tablet Take 1 tablet (25 mg total) by mouth 2 (two)  times daily. 60 tablet 5  . sertraline (ZOLOFT) 50 MG tablet Take 1 tablet (50 mg total) by mouth at bedtime. 90 tablet 0   No current facility-administered medications on file prior to visit.    Allergies  Allergen Reactions  . Tape     Prefers paper tape  . Fluticasone-Salmeterol     REACTION: headache.  She is tolerating Dulera well.    Family History  Problem Relation Age of Onset  . Heart attack Father   . Heart disease Father   . Heart failure Mother   . Pneumonia Mother   . Hypertension Mother   . Heart disease Mother   . Stroke Maternal Grandfather   . Hypertension Maternal Grandfather   . Asthma Paternal Uncle     PAT UNCLES  . Heart attack Paternal Uncle     BP 132/80 mmHg  Pulse 76  Temp(Src) 97.9 F (36.6 C) (Oral)  Ht 5' (1.524 m)  Wt 162 lb (73.483 kg)  BMI 31.64 kg/m2  SpO2 91%   Review of Systems She denies weight change.  Denies hematuria    Objective:   Physical Exam VITAL SIGNS:  See vs page GENERAL: no distress NECK: There is no palpable thyroid enlargement.  No thyroid nodule is palpable.  No palpable lymphadenopathy at the anterior neck. Skin: normal texture and temp.  Neuro: no tremor PSYCH: Alert and well-oriented.  Does not appear anxious nor depressed.   Lab Results  Component Value Date   TSH 0.20* 09/17/2014       Assessment & Plan:  Post-I-131 hypothyroidism: slightly overreplaced. Postop anemia: due for recheck Multinodular goiter: improved with i-131 rx.    Patient is advised the following: Patient Instructions  blood tests are requested for you today.  We'll let you know about the results. Please come back for a follow-up appointment in 6 months.    here are some tests for blood in the bowels.  please follow the instructions, and return to the lab.   Most of the time, your thyroid will start working more or less than it is now.  This means that your need for the levothyroxine will go up or down with time, bult seldom  stays the same.    addendum: i have sent a prescription to your pharmacy, to reduce the synthroid.

## 2014-09-24 NOTE — Progress Notes (Signed)
Urine drug screen for this encounter is consistent for prescribed medication 

## 2014-10-06 ENCOUNTER — Other Ambulatory Visit: Payer: Self-pay | Admitting: Emergency Medicine

## 2014-10-06 MED ORDER — METOPROLOL TARTRATE 25 MG PO TABS: 25.0000 mg | ORAL_TABLET | Freq: Two times a day (BID) | ORAL | Status: DC

## 2014-10-13 ENCOUNTER — Encounter: Payer: Self-pay | Admitting: Registered Nurse

## 2014-10-13 ENCOUNTER — Encounter: Payer: Medicare Other | Attending: Physical Medicine & Rehabilitation | Admitting: Registered Nurse

## 2014-10-13 VITALS — BP 155/79 | HR 72 | Resp 16

## 2014-10-13 DIAGNOSIS — M48062 Spinal stenosis, lumbar region with neurogenic claudication: Secondary | ICD-10-CM

## 2014-10-13 DIAGNOSIS — M4806 Spinal stenosis, lumbar region: Secondary | ICD-10-CM | POA: Insufficient documentation

## 2014-10-13 DIAGNOSIS — M961 Postlaminectomy syndrome, not elsewhere classified: Secondary | ICD-10-CM

## 2014-10-13 DIAGNOSIS — Z79899 Other long term (current) drug therapy: Secondary | ICD-10-CM

## 2014-10-13 DIAGNOSIS — M1611 Unilateral primary osteoarthritis, right hip: Secondary | ICD-10-CM | POA: Diagnosis present

## 2014-10-13 DIAGNOSIS — Z5181 Encounter for therapeutic drug level monitoring: Secondary | ICD-10-CM

## 2014-10-13 DIAGNOSIS — M419 Scoliosis, unspecified: Secondary | ICD-10-CM

## 2014-10-13 DIAGNOSIS — M4126 Other idiopathic scoliosis, lumbar region: Secondary | ICD-10-CM | POA: Diagnosis not present

## 2014-10-13 DIAGNOSIS — M24551 Contracture, right hip: Secondary | ICD-10-CM

## 2014-10-13 MED ORDER — HYDROCODONE-ACETAMINOPHEN 10-325 MG PO TABS: 1.0000 | ORAL_TABLET | Freq: Four times a day (QID) | ORAL | Status: DC | PRN

## 2014-10-13 NOTE — Progress Notes (Signed)
Subjective:    Patient ID: Hannah Scott, female    DOB: 1950-09-01, 64 y.o.   MRN: [id number]  HPI: [name] is a 64 year old female who returns for follow up for chronic pain and medication refill. She says her pain is located in her lower back. She rates her pain 7. Her current exercise regime is walking for short distances using her cadillac walker.  Pain Inventory Average Pain 7 Pain Right Now 7 My pain is constant, burning, tingling and aching  In the last 24 hours, has pain interfered with the following? General activity 10 Relation with others 10 Enjoyment of life 10 What TIME of day is your pain at its worst? all Sleep (in general) Poor  Pain is worse with: walking and standing Pain improves with: rest and medication Relief from Meds: 5  Mobility use a cane use a walker ability to climb steps?  yes do you drive?  yes  Function disabled: date disabled . I need assistance with the following:  household duties and shopping  Neuro/Psych weakness numbness tingling trouble walking  Prior Studies Any changes since last visit?  no  Physicians involved in your care Any changes since last visit?  no   Family History  Problem Relation Age of Onset  . Heart attack Father   . Heart disease Father   . Heart failure Mother   . Pneumonia Mother   . Hypertension Mother   . Heart disease Mother   . Stroke Maternal Grandfather   . Hypertension Maternal Grandfather   . Asthma Paternal Uncle     PAT UNCLES  . Heart attack Paternal Uncle    History   Social History  . Marital Status: Married    Spouse Name: N/A  . Number of Children: N/A  . Years of Education: N/A   Social History Main Topics  . Smoking status: Never Smoker   . Smokeless tobacco: Never Used  . Alcohol Use: No  . Drug Use: No  . Sexual Activity: No   Other Topics Concern  . None   Social History Narrative   Past Surgical History  Procedure Laterality Date  . Cataract  extraction Bilateral 01,03  . Cardiovascular stress test  09/07/05    Nuclear, was negative  . Breast lumpectomy  2003    radiation on right  . Appendectomy  1987    AT [facility]  . Total abdominal hysterectomy  1987    LSO, APPENDECTOMY  . Pelvic laparoscopy  1989    RSO, LYSIS OF ADHESIONS  . Oophorectomy      LSO -RSO  . Back surgery      Fusion  . Ankle surgery Left     ligament  . Total hip arthroplasty Right 10/28/2013    dr Lorin Mercy  . Total hip arthroplasty Right 10/28/2013    Procedure: TOTAL HIP ARTHROPLASTY ANTERIOR APPROACH;  Surgeon: Marybelle Killings, MD;  Location: [facility];  Service: Orthopedics;  Laterality: Right;  Right Total Hip Arthroplasty, Direct Anterior Approach   Past Medical History  Diagnosis Date  . Asthma     PFTs, February, 2011, moderate obstructive disease with response to bronchodilators, normal lung volumes, moderate reduction in diffusing capacity  . Obstructive airway disease   . Hyperlipidemia   . Hypertension   . Hypothyroidism     Patient has had in the past that she does not need treatment  . CAD (coronary artery disease)     90% distal  LAD in the past  /   nuclear, 2008, no ischemia, ejection fraction 70%  . Atrial septal aneurysm     Echo, 2008-not noted on 13 echo  . Pinched nerve     lower back  . Kyphoscoliosis   . Shortness of breath   . Endometriosis 1989    RIGHT TUBE  . Endometriosis 1987    LEFT TUBE/OVARY W FOCAL IN-SITU ENDOMETRIAL ADENOCARCINOMA  . Cancer 2002    DUCTAL CIS--S/P LUMPECTOMY, RADIATION AND 6 WEEKS OF TAMOXIFEN  . Ejection fraction     EF 60%, echo, October, 2008  . Elevated CPK     January, 2014  . D-dimer, elevated     January, 2014  . Depression   . UTI (lower urinary tract infection)   . GERD (gastroesophageal reflux disease)     occ  . H/O hiatal hernia     ?  [name] Arthritis   . Anemia     hx   BP 155/79 mmHg  Pulse 72  Resp 16  SpO2 93%  Opioid Risk Score:   Fall Risk Score: Moderate Fall Risk (6-13  points) (previously educated and given handout)`1  Depression screen PHQ 2/9  Depression screen PHQ 2/9 07/11/2014  Decreased Interest 3  Down, Depressed, Hopeless 2  PHQ - 2 Score 5  Altered sleeping 3  Tired, decreased energy 3  Change in appetite 0  Feeling bad or failure about yourself  0  Trouble concentrating 0  Moving slowly or fidgety/restless 0  Suicidal thoughts 0  PHQ-9 Score 11    Review of Systems  Musculoskeletal: Positive for gait problem.  Neurological: Positive for weakness and numbness.       Tingling  All other systems reviewed and are negative.      Objective:   Physical Exam  Constitutional: She is oriented to person, place, and time. She appears well-developed and well-nourished.  HENT:  Head: Normocephalic and atraumatic.  Cardiovascular: Normal rate and regular rhythm.   Pulmonary/Chest: Effort normal and breath sounds normal.  Musculoskeletal:  Normal Muscle Bulk and Muscle Testing Reveals: Upper Extremities: Full ROM and Muscle Strength 5/5 Lumbar Scoliosis Lumbar Paraspinal Tenderness: L-3- L-5 Lower Extremities: Full ROM and Muscle Strength 5/5 Right Lower Extremity Flexion Produces pain into Hip Arises from chair slowly/ Using Cadillac Walker Narrow Based gait   Neurological: She is alert and oriented to person, place, and time.  Skin: Skin is warm and dry.  Psychiatric: She has a normal mood and affect.  Nursing note and vitals reviewed.         Assessment & Plan:  1 Lumbar post laminectomy syndrome, s/p lumbar fusion:  Refilled: Hydrocodone 10/325mg  one tablet every 8 hours #90 2. Right Hip OA: S/P Right Hip Replacement 10/28/2013. 3.Depression: On Zoloft. Has Family Support and Friends. Counseling with Doristine Bosworth. 4. Right Greater Trochanteric Tenderness: Continue with Ice and Heat Therapy.   20 minutes of face to face patient care time was spent during this visit. All questions were encouraged and answered.  F/U in 1 month

## 2014-10-20 ENCOUNTER — Encounter: Payer: Self-pay | Admitting: Internal Medicine

## 2014-10-20 ENCOUNTER — Other Ambulatory Visit: Payer: Self-pay

## 2014-10-20 ENCOUNTER — Ambulatory Visit (INDEPENDENT_AMBULATORY_CARE_PROVIDER_SITE_OTHER): Payer: Medicare Other | Admitting: Internal Medicine

## 2014-10-20 VITALS — BP 180/112 | HR 80 | Temp 98.4°F | Resp 18 | Wt 168.0 lb

## 2014-10-20 DIAGNOSIS — M419 Scoliosis, unspecified: Secondary | ICD-10-CM | POA: Diagnosis not present

## 2014-10-20 DIAGNOSIS — J441 Chronic obstructive pulmonary disease with (acute) exacerbation: Secondary | ICD-10-CM

## 2014-10-20 DIAGNOSIS — J4530 Mild persistent asthma, uncomplicated: Secondary | ICD-10-CM

## 2014-10-20 DIAGNOSIS — I10 Essential (primary) hypertension: Secondary | ICD-10-CM

## 2014-10-20 DIAGNOSIS — J449 Chronic obstructive pulmonary disease, unspecified: Secondary | ICD-10-CM

## 2014-10-20 MED ORDER — ALBUTEROL SULFATE HFA 108 (90 BASE) MCG/ACT IN AERS: 1.0000 | INHALATION_SPRAY | Freq: Four times a day (QID) | RESPIRATORY_TRACT | Status: DC | PRN

## 2014-10-20 MED ORDER — HYDROCHLOROTHIAZIDE 12.5 MG PO CAPS: 12.5000 mg | ORAL_CAPSULE | Freq: Every day | ORAL | Status: DC

## 2014-10-20 MED ORDER — METOPROLOL TARTRATE 50 MG PO TABS: ORAL_TABLET | ORAL | Status: DC

## 2014-10-20 MED ORDER — IPRATROPIUM-ALBUTEROL 0.5-2.5 (3) MG/3ML IN SOLN: 3.0000 mL | RESPIRATORY_TRACT | Status: DC | PRN

## 2014-10-20 NOTE — Progress Notes (Signed)
   Subjective:    Patient ID: Hannah Scott, female    DOB: 03-22-1951, 64 y.o.   MRN: [id number]  HPI She describes severe fatigue;" I can hardly keep my head up". She's had some wheezing intermittently. She is here for refill of her albuterol as well as her DuoNeb. She uses the DuoNeb several times a week.  She's had rare heartburn which is relieved by TUMS. She denies reflux per se.  Dr. Loanne Drilling is treating her thyroid disease. On 5/25 the TSH was 0.2 & free T4 1.20. The thyroid dose was decreased from 75 g to 50 g.Hgb was 11.9.  She states her blood pressure at home is in the 130-150s over 70s-80s range. Her systolic blood pressure has been up since her hip replacement in July of last year.  Last PFTs in 2011 revealed moderate obstruction with BD response. No restriction despite spinal deformities.  Review of Systems She does have some postnasal drainage which is clear. This is associated with some hoarseness. She denies any sinus pressure or pain. She does not have watery, itchy eyes.She has no associated fever, chills, or sweats.   She denies chest pain or palpitations. There is no claudication but she is limited in activities. She's not had edema since she stopped her amlodipine.  She also denies dysphagia. She has no associated abdominal pain.  She has been depressed since her husband's death.    Objective:   Physical Exam Pertinent or positive findings include: Fundi could not be visualized optimally. Pupils are small. There is accentuation of the first and second heart sounds. Breath sounds are slightly decreased but surprisingly clear. She has dramatic kyphoscoliosis.She became upset when our primary focus was on her severe hypertension;risk was discussed.  General appearance :adequately nourished; in no distress.  Eyes: No conjunctival inflammation or scleral icterus is present.  Heart:  Normal rate and regular rhythm. S1 and S2 normal without gallop, murmur, click, rub  or other extra sounds    Lungs:Chest clear to auscultation; no wheezes, rhonchi,rales ,or rubs present.No increased work of breathing.   Abdomen: bowel sounds normal, soft and non-tender without masses, organomegaly or hernias noted.  No guarding or rebound.   Vascular : all pulses equal ; no bruits present.  Skin:Warm & dry.  Intact without suspicious lesions or rashes ; no tenting or jaundice   Lymphatic: No lymphadenopathy is noted about the head, neck, axilla   Neuro: Strength, tone decreased     Assessment & Plan:  #1 chronic asthmatic bronchitis with recent exacerbation. Clinically today she appears stable.Pulmonary assessment of status will be requested.R/O component of cor pulmonale.  #2 severely uncontrolled hypertension with dramatic risk   #3 PMH of edema with amlodipine Plan: See orders and recommendations

## 2014-10-20 NOTE — Patient Instructions (Signed)
Minimal Blood Pressure Goal= AVERAGE < 140/90;  Ideal is an AVERAGE < 135/85. This AVERAGE should be calculated from @ least 5-7 BP readings taken @ different times of day on different days of week. You should not respond to isolated BP readings , but rather the AVERAGE for that week .Please bring your  blood pressure cuff to office visits to verify that it is reliable.It  can also be checked against the blood pressure device at the pharmacy. Finger or wrist cuffs are not dependable; an arm cuff is.  Make appointment in 10-14 days for BP follow up

## 2014-10-20 NOTE — Progress Notes (Signed)
Pre visit review using our clinic review tool, if applicable. No additional management support is needed unless otherwise documented below in the visit note. 

## 2014-10-22 ENCOUNTER — Other Ambulatory Visit: Payer: Self-pay | Admitting: Emergency Medicine

## 2014-10-22 MED ORDER — ALBUTEROL SULFATE 108 (90 BASE) MCG/ACT IN AEPB: 1.0000 | INHALATION_SPRAY | Freq: Four times a day (QID) | RESPIRATORY_TRACT | Status: DC | PRN

## 2014-11-14 ENCOUNTER — Other Ambulatory Visit: Payer: Self-pay | Admitting: Emergency Medicine

## 2014-11-14 DIAGNOSIS — J4521 Mild intermittent asthma with (acute) exacerbation: Secondary | ICD-10-CM

## 2014-11-14 MED ORDER — FLUTICASONE FUROATE-VILANTEROL 100-25 MCG/INH IN AEPB: 1.0000 | INHALATION_SPRAY | Freq: Every day | RESPIRATORY_TRACT | Status: DC

## 2014-11-18 ENCOUNTER — Encounter: Payer: Self-pay | Admitting: Registered Nurse

## 2014-11-18 ENCOUNTER — Encounter: Payer: Medicare Other | Attending: Physical Medicine & Rehabilitation | Admitting: Registered Nurse

## 2014-11-18 VITALS — BP 140/70 | HR 78 | Resp 14

## 2014-11-18 DIAGNOSIS — Z5181 Encounter for therapeutic drug level monitoring: Secondary | ICD-10-CM | POA: Diagnosis not present

## 2014-11-18 DIAGNOSIS — M4126 Other idiopathic scoliosis, lumbar region: Secondary | ICD-10-CM | POA: Diagnosis not present

## 2014-11-18 DIAGNOSIS — Z79899 Other long term (current) drug therapy: Secondary | ICD-10-CM

## 2014-11-18 DIAGNOSIS — M961 Postlaminectomy syndrome, not elsewhere classified: Secondary | ICD-10-CM | POA: Diagnosis not present

## 2014-11-18 DIAGNOSIS — M24551 Contracture, right hip: Secondary | ICD-10-CM

## 2014-11-18 DIAGNOSIS — M1611 Unilateral primary osteoarthritis, right hip: Secondary | ICD-10-CM | POA: Diagnosis present

## 2014-11-18 DIAGNOSIS — M419 Scoliosis, unspecified: Secondary | ICD-10-CM

## 2014-11-18 DIAGNOSIS — M48062 Spinal stenosis, lumbar region with neurogenic claudication: Secondary | ICD-10-CM

## 2014-11-18 DIAGNOSIS — M4806 Spinal stenosis, lumbar region: Secondary | ICD-10-CM | POA: Diagnosis not present

## 2014-11-18 MED ORDER — HYDROCODONE-ACETAMINOPHEN 10-325 MG PO TABS: 1.0000 | ORAL_TABLET | Freq: Four times a day (QID) | ORAL | Status: DC | PRN

## 2014-11-18 NOTE — Progress Notes (Signed)
Subjective:    Patient ID: Hannah Scott, female    DOB: 02/10/1951, 64 y.o.   MRN: [id number]  HPI: Hannah Scott is a 64 year old female who returns for follow up for chronic pain and medication refill. She says her pain is located in her lower back. She rates her pain 6. Her current exercise regime is walking for short distances using her cadillac walker.  Pain Inventory Average Pain 7 Pain Right Now 6 My pain is constant, burning, tingling and aching  In the last 24 hours, has pain interfered with the following? General activity 10 Relation with others 10 Enjoyment of life 10 What TIME of day is your pain at its worst? ALL Sleep (in general) Poor  Pain is worse with: bending, standing and some activites Pain improves with: rest and medication Relief from Meds: 5  Mobility use a walker how many minutes can you walk? 5 ability to climb steps?  yes do you drive?  no  Function disabled: date disabled .  Neuro/Psych weakness numbness tingling trouble walking  Prior Studies Any changes since last visit?  no  Physicians involved in your care Any changes since last visit?  no/   Family History  Problem Relation Age of Onset  . Heart attack Father   . Heart disease Father   . Heart failure Mother   . Pneumonia Mother   . Hypertension Mother   . Heart disease Mother   . Stroke Maternal Grandfather   . Hypertension Maternal Grandfather   . Asthma Paternal Uncle     PAT UNCLES  . Heart attack Paternal Uncle    History   Social History  . Marital Status: Married    Spouse Name: N/A  . Number of Children: N/A  . Years of Education: N/A   Social History Main Topics  . Smoking status: Never Smoker   . Smokeless tobacco: Never Used  . Alcohol Use: No  . Drug Use: No  . Sexual Activity: No   Other Topics Concern  . None   Social History Narrative   Past Surgical History  Procedure Laterality Date  . Cataract extraction Bilateral 01,03  .  Cardiovascular stress test  09/07/05    Nuclear, was negative  . Breast lumpectomy  2003    radiation on right  . Appendectomy  1987    [facility]  . Total abdominal hysterectomy  1987    LSO, APPENDECTOMY  . Pelvic laparoscopy  1989    RSO, LYSIS OF ADHESIONS  . Oophorectomy      LSO -RSO  . Back surgery      Fusion  . Ankle surgery Left     ligament  . Total hip arthroplasty Right 10/28/2013    dr Lorin Mercy  . Total hip arthroplasty Right 10/28/2013    Procedure: TOTAL HIP ARTHROPLASTY ANTERIOR APPROACH;  Surgeon: Marybelle Killings, MD;  Location: [facility];  Service: Orthopedics;  Laterality: Right;  Right Total Hip Arthroplasty, Direct Anterior Approach   Past Medical History  Diagnosis Date  . Asthma     PFTs, February, 2011, moderate obstructive disease with response to bronchodilators, normal lung volumes, moderate reduction in diffusing capacity  . Obstructive airway disease   . Hyperlipidemia   . Hypertension   . Hypothyroidism     Patient has had in the past that she does not need treatment  . CAD (coronary artery disease)     90% distal LAD in the past  /  nuclear, 2008, no ischemia, ejection fraction 70%  . Atrial septal aneurysm     Echo, 2008-not noted on 13 echo  . Pinched nerve     lower back  . Kyphoscoliosis   . Shortness of breath   . Endometriosis 1989    RIGHT TUBE  . Endometriosis 1987    LEFT TUBE/OVARY W FOCAL IN-SITU ENDOMETRIAL ADENOCARCINOMA  . Cancer 2002    DUCTAL CIS--S/P LUMPECTOMY, RADIATION AND 6 WEEKS OF TAMOXIFEN  . Ejection fraction     EF 60%, echo, October, 2008  . Elevated CPK     January, 2014  . D-dimer, elevated     January, 2014  . Depression   . UTI (lower urinary tract infection)   . GERD (gastroesophageal reflux disease)     occ  . H/O hiatal hernia     ?  [name] Arthritis   . Anemia     hx   BP 140/70 mmHg  Pulse 78  Resp 14  SpO2 96%  Opioid Risk Score:   Fall Risk Score:  `1  Depression screen PHQ 2/9  Depression screen PHQ  2/9 07/11/2014  Decreased Interest 3  Down, Depressed, Hopeless 2  PHQ - 2 Score 5  Altered sleeping 3  Tired, decreased energy 3  Change in appetite 0  Feeling bad or failure about yourself  0  Trouble concentrating 0  Moving slowly or fidgety/restless 0  Suicidal thoughts 0  PHQ-9 Score 11     Review of Systems  HENT: Negative.   Eyes: Negative.   Respiratory: Negative.   Cardiovascular: Negative.   Gastrointestinal: Negative.   Endocrine: Negative.   Genitourinary: Negative.   Musculoskeletal: Positive for myalgias, back pain, joint swelling and arthralgias.       Right hip pain, right hand/arm pain  Skin: Negative.   Allergic/Immunologic: Negative.   Neurological: Positive for weakness and numbness.       Tingling, trouble walking  Hematological: Negative.   Psychiatric/Behavioral: Negative.        Objective:   Physical Exam  Constitutional: She is oriented to person, place, and time. She appears well-developed and well-nourished.  HENT:  Head: Normocephalic and atraumatic.  Neck: Normal range of motion. Neck supple.  Cardiovascular: Normal rate and regular rhythm.   Pulmonary/Chest: Effort normal and breath sounds normal.  Musculoskeletal:  Normal Muscle Bulk and Muscle Testing Reveals: Upper Extremities: Full ROM and Muscle Strength 5/5 Lumbar Scoliosis Lumbar Paraspinal Tenderness: L-3- L-5 Right Greater Trochanteric Tenderness Lower Extremities: Full ROM and Muscle Strength 5/5 Arises from chair slowly/ using cadillac walker for support Narrow Based gait   Neurological: She is alert and oriented to person, place, and time.  Skin: Skin is warm and dry.  Psychiatric: She has a normal mood and affect.  Nursing note and vitals reviewed.         Assessment & Plan:  1 Lumbar post laminectomy syndrome, s/p lumbar fusion:  Refilled: Hydrocodone 10/325mg  one tablet every 8 hours #90 2. Right Hip OA: S/P Right Hip Replacement 10/28/2013. 3.Depression:  On Zoloft. Has Family Support and Friends. Counseling with Doristine Bosworth. 4. Right Greater Trochanteric Tenderness: Continue with Ice and Heat Therapy.   20 minutes of face to face patient care time was spent during this visit. All questions were encouraged and answered.  F/U in 1 month

## 2014-11-24 ENCOUNTER — Other Ambulatory Visit: Payer: Self-pay | Admitting: *Deleted

## 2014-11-24 ENCOUNTER — Other Ambulatory Visit (INDEPENDENT_AMBULATORY_CARE_PROVIDER_SITE_OTHER): Payer: Medicare Other

## 2014-11-24 DIAGNOSIS — E039 Hypothyroidism, unspecified: Secondary | ICD-10-CM

## 2014-11-24 LAB — T4, FREE: Free T4: 0.93 ng/dL (ref 0.60–1.60)

## 2014-11-24 LAB — TSH: TSH: 3.42 u[IU]/mL (ref 0.35–4.50)

## 2014-12-12 ENCOUNTER — Ambulatory Visit: Payer: Medicare Other | Admitting: Endocrinology

## 2014-12-18 ENCOUNTER — Encounter: Payer: Self-pay | Admitting: Registered Nurse

## 2014-12-18 ENCOUNTER — Encounter: Payer: Medicare Other | Attending: Physical Medicine & Rehabilitation | Admitting: Registered Nurse

## 2014-12-18 VITALS — BP 163/87 | HR 73 | Resp 14

## 2014-12-18 DIAGNOSIS — M1611 Unilateral primary osteoarthritis, right hip: Secondary | ICD-10-CM | POA: Diagnosis present

## 2014-12-18 DIAGNOSIS — M419 Scoliosis, unspecified: Secondary | ICD-10-CM

## 2014-12-18 DIAGNOSIS — M4126 Other idiopathic scoliosis, lumbar region: Secondary | ICD-10-CM | POA: Diagnosis not present

## 2014-12-18 DIAGNOSIS — Z79899 Other long term (current) drug therapy: Secondary | ICD-10-CM

## 2014-12-18 DIAGNOSIS — M961 Postlaminectomy syndrome, not elsewhere classified: Secondary | ICD-10-CM | POA: Diagnosis not present

## 2014-12-18 DIAGNOSIS — M24551 Contracture, right hip: Secondary | ICD-10-CM

## 2014-12-18 DIAGNOSIS — M4806 Spinal stenosis, lumbar region: Secondary | ICD-10-CM | POA: Insufficient documentation

## 2014-12-18 DIAGNOSIS — Z5181 Encounter for therapeutic drug level monitoring: Secondary | ICD-10-CM

## 2014-12-18 MED ORDER — HYDROCODONE-ACETAMINOPHEN 10-325 MG PO TABS: 1.0000 | ORAL_TABLET | Freq: Four times a day (QID) | ORAL | Status: DC | PRN

## 2014-12-18 NOTE — Progress Notes (Deleted)
Subjective:    Patient ID: Hannah Scott, female    DOB: May 04, 1950, 64 y.o.   MRN: [id number]  HPI: [name] is a 64 year old female who returns for follow up for chronic pain and medication refill. She says her pain is located in her lower back.Also complaining of headache.  She rates her pain 6. Her current exercise regime is walking for short distances using her cadillac walker     Review of Systems  Musculoskeletal: Positive for gait problem.  Neurological: Positive for weakness and numbness.  All other systems reviewed and are negative.  Pain Inventory Average Pain 8 Pain Right Now 7 My pain is constant, burning, tingling and aching  In the last 24 hours, has pain interfered with the following? General activity 10 Relation with others 10 Enjoyment of life 10 What TIME of day is your pain at its worst? daytime, evening, night Sleep (in general) Poor  Pain is worse with: walking and standing Pain improves with: rest and medication Relief from Meds:6        Objective:   Physical Exam        Assessment & Plan:    Mobility walk without assistance use a cane use a walker ability to climb steps?  yes do you drive?  yes  Function disabled: date disabled . I need assistance with the following:  household duties and shopping  Neuro/Psych weakness numbness trouble walking  Prior Studies Any changes since last visit?  no  Physicians involved in your care Any changes since last visit?  no   Family History  Problem Relation Age of Onset  . Heart attack Father   . Heart disease Father   . Heart failure Mother   . Pneumonia Mother   . Hypertension Mother   . Heart disease Mother   . Stroke Maternal Grandfather   . Hypertension Maternal Grandfather   . Asthma Paternal Uncle     PAT UNCLES  . Heart attack Paternal Uncle    Social History   Social History  . Marital Status: Married    Spouse Name: N/A  . Number of Children: N/A  . Years  of Education: N/A   Social History Main Topics  . Smoking status: Never Smoker   . Smokeless tobacco: Never Used  . Alcohol Use: No  . Drug Use: No  . Sexual Activity: No   Other Topics Concern  . None   Social History Narrative   Past Surgical History  Procedure Laterality Date  . Cataract extraction Bilateral 01,03  . Cardiovascular stress test  09/07/05    Nuclear, was negative  . Breast lumpectomy  2003    radiation on right  . Appendectomy  1987    [facility]  . Total abdominal hysterectomy  1987    LSO, APPENDECTOMY  . Pelvic laparoscopy  1989    RSO, LYSIS OF ADHESIONS  . Oophorectomy      LSO -RSO  . Back surgery      Fusion  . Ankle surgery Left     ligament  . Total hip arthroplasty Right 10/28/2013    dr Lorin Mercy  . Total hip arthroplasty Right 10/28/2013    Procedure: TOTAL HIP ARTHROPLASTY ANTERIOR APPROACH;  Surgeon: Marybelle Killings, MD;  Location: [facility];  Service: Orthopedics;  Laterality: Right;  Right Total Hip Arthroplasty, Direct Anterior Approach   Past Medical History  Diagnosis Date  . Asthma     PFTs, February, 2011,  moderate obstructive disease with response to bronchodilators, normal lung volumes, moderate reduction in diffusing capacity  . Obstructive airway disease   . Hyperlipidemia   . Hypertension   . Hypothyroidism     Patient has had in the past that she does not need treatment  . CAD (coronary artery disease)     90% distal LAD in the past  /   nuclear, 2008, no ischemia, ejection fraction 70%  . Atrial septal aneurysm     Echo, 2008-not noted on 13 echo  . Pinched nerve     lower back  . Kyphoscoliosis   . Shortness of breath   . Endometriosis 1989    RIGHT TUBE  . Endometriosis 1987    LEFT TUBE/OVARY W FOCAL IN-SITU ENDOMETRIAL ADENOCARCINOMA  . Cancer 2002    DUCTAL CIS--S/P LUMPECTOMY, RADIATION AND 6 WEEKS OF TAMOXIFEN  . Ejection fraction     EF 60%, echo, October, 2008  . Elevated CPK     January, 2014  . D-dimer, elevated       January, 2014  . Depression   . UTI (lower urinary tract infection)   . GERD (gastroesophageal reflux disease)     occ  . H/O hiatal hernia     ?  [name] Arthritis   . Anemia     hx   BP 173/86 mmHg  Pulse 73  Resp 14  SpO2 93%  Opioid Risk Score:   Fall Risk Score:  `1  Depression screen PHQ 2/9  Depression screen PHQ 2/9 07/11/2014  Decreased Interest 3  Down, Depressed, Hopeless 2  PHQ - 2 Score 5  Altered sleeping 3  Tired, decreased energy 3  Change in appetite 0  Feeling bad or failure about yourself  0  Trouble concentrating 0  Moving slowly or fidgety/restless 0  Suicidal thoughts 0  PHQ-9 Score 11

## 2014-12-19 NOTE — Progress Notes (Signed)
Subjective:    Patient ID: Hannah Scott, female    DOB: [birth year], 64 y.o.   MRN: [id number]  HPI:[name] is a 64 year old female who returns for follow up for chronic pain and medication refill. She says her pain is located in her lower back.Also states she has a headache today. She rates her pain 7. Her current exercise regime is walking for short distances using her cadillac walker. Arrived to office hypertensive blood pressure rechecked twice. Hannah Scott states she's compliant with her antihypertensives. She will take her hydrochlorothiazide when she get's home. Educated on medication compliance and instructed to keep blood pressure log she verbalizes understanding. Instructed to follow up with her PCP she verbalizes understanding.   Pain Inventory Average Pain 8 Pain Right Now 7 My pain is constant, burning, tingling and aching  In the last 24 hours, has pain interfered with the following? General activity 10 Relation with others 10 Enjoyment of life 10 What TIME of day is your pain at its worst? daytime, evening, night Sleep (in general) Poor  Pain is worse with: walking and standing Pain improves with: rest and medication Relief from Meds: 6  Mobility walk with assistance use a cane use a walker ability to climb steps?  yes do you drive?  yes  Function disabled: date disabled . I need assistance with the following:  household duties and shopping  Neuro/Psych weakness numbness trouble walking  Prior Studies Any changes since last visit?  no  Physicians involved in your care Any changes since last visit?  no   Family History  Problem Relation Age of Onset  . Heart attack Father   . Heart disease Father   . Heart failure Mother   . Pneumonia Mother   . Hypertension Mother   . Heart disease Mother   . Stroke Maternal Grandfather   . Hypertension Maternal Grandfather   . Asthma Paternal Uncle     PAT UNCLES  . Heart attack Paternal Uncle    Social  History   Social History  . Marital Status: Married    Spouse Name: N/A  . Number of Children: N/A  . Years of Education: N/A   Social History Main Topics  . Smoking status: Never Smoker   . Smokeless tobacco: Never Used  . Alcohol Use: No  . Drug Use: No  . Sexual Activity: No   Other Topics Concern  . None   Social History Narrative   Past Surgical History  Procedure Laterality Date  . Cataract extraction Bilateral 01,03  . Cardiovascular stress test  09/07/05    Nuclear, was negative  . Breast lumpectomy  2003    radiation on right  . Appendectomy  1987    AT [facility]  . Total abdominal hysterectomy  1987    LSO, APPENDECTOMY  . Pelvic laparoscopy  1989    RSO, LYSIS OF ADHESIONS  . Oophorectomy      LSO -RSO  . Back surgery      Fusion  . Ankle surgery Left     ligament  . Total hip arthroplasty Right 10/28/2013    dr Lorin Mercy  . Total hip arthroplasty Right 10/28/2013    Procedure: TOTAL HIP ARTHROPLASTY ANTERIOR APPROACH;  Surgeon: Marybelle Killings, MD;  Location: [facility];  Service: Orthopedics;  Laterality: Right;  Right Total Hip Arthroplasty, Direct Anterior Approach   Past Medical History  Diagnosis Date  . Asthma     PFTs, February, 2011,  moderate obstructive disease with response to bronchodilators, normal lung volumes, moderate reduction in diffusing capacity  . Obstructive airway disease   . Hyperlipidemia   . Hypertension   . Hypothyroidism     Patient has had in the past that she does not need treatment  . CAD (coronary artery disease)     90% distal LAD in the past  /   nuclear, 2008, no ischemia, ejection fraction 70%  . Atrial septal aneurysm     Echo, 2008-not noted on 13 echo  . Pinched nerve     lower back  . Kyphoscoliosis   . Shortness of breath   . Endometriosis 1989    RIGHT TUBE  . Endometriosis 1987    LEFT TUBE/OVARY W FOCAL IN-SITU ENDOMETRIAL ADENOCARCINOMA  . Cancer 2002    DUCTAL CIS--S/P LUMPECTOMY, RADIATION AND 6 WEEKS OF TAMOXIFEN    . Ejection fraction     EF 60%, echo, October, 2008  . Elevated CPK     January, 2014  . D-dimer, elevated     January, 2014  . Depression   . UTI (lower urinary tract infection)   . GERD (gastroesophageal reflux disease)     occ  . H/O hiatal hernia     ?  [name] Arthritis   . Anemia     hx   BP 163/87 mmHg  Pulse 73  Resp 14  SpO2 93%  Opioid Risk Score:   Fall Risk Score:  `1  Depression screen PHQ 2/9  Depression screen PHQ 2/9 07/11/2014  Decreased Interest 3  Down, Depressed, Hopeless 2  PHQ - 2 Score 5  Altered sleeping 3  Tired, decreased energy 3  Change in appetite 0  Feeling bad or failure about yourself  0  Trouble concentrating 0  Moving slowly or fidgety/restless 0  Suicidal thoughts 0  PHQ-9 Score 11     Review of Systems  Musculoskeletal: Positive for gait problem.  Neurological: Positive for weakness and numbness.  All other systems reviewed and are negative.      Objective:   Physical Exam  Constitutional: She is oriented to person, place, and time. She appears well-developed and well-nourished.  HENT:  Head: Normocephalic and atraumatic.  Neck: Normal range of motion. Neck supple.  Cardiovascular: Normal rate and regular rhythm.   Pulmonary/Chest: Effort normal and breath sounds normal.  Musculoskeletal:  Normal Muscle Bulk and Muscle Testing Reveals: Upper Extremities: Full ROM and Muscle Strength 5/5 Lumbar Scoliosis Lumbar Paraspinal Tenderness: L-3- L-4 Lower Extremities: Full ROM and Muscle Strength 5/5 Arises from chair slowly using cadillac walker for support Antalgic gait  Neurological: She is alert and oriented to person, place, and time.  Skin: Skin is warm and dry.  Psychiatric: She has a normal mood and affect.  Nursing note and vitals reviewed.         Assessment & Plan:  1 Lumbar post laminectomy syndrome, s/p lumbar fusion:  Refilled: Hydrocodone 10/325mg  one tablet every 8 hours #90 2. Right Hip OA: S/P Right  Hip Replacement 10/28/2013. 3.Depression: On Zoloft. Has Family Support and Friends. Counseling with Doristine Bosworth. 4. Right Greater Trochanteric Tenderness: Continue with Ice and Heat Therapy.   20 minutes of face to face patient care time was spent during this visit. All questions were encouraged and answered.  F/U in 1 month

## 2015-01-20 ENCOUNTER — Encounter: Payer: Self-pay | Admitting: Registered Nurse

## 2015-01-20 ENCOUNTER — Encounter: Payer: Medicare Other | Attending: Physical Medicine & Rehabilitation | Admitting: Registered Nurse

## 2015-01-20 VITALS — BP 148/90 | HR 80 | Resp 14

## 2015-01-20 DIAGNOSIS — M24551 Contracture, right hip: Secondary | ICD-10-CM

## 2015-01-20 DIAGNOSIS — M419 Scoliosis, unspecified: Secondary | ICD-10-CM

## 2015-01-20 DIAGNOSIS — M1611 Unilateral primary osteoarthritis, right hip: Secondary | ICD-10-CM | POA: Insufficient documentation

## 2015-01-20 DIAGNOSIS — M961 Postlaminectomy syndrome, not elsewhere classified: Secondary | ICD-10-CM | POA: Diagnosis not present

## 2015-01-20 DIAGNOSIS — M4806 Spinal stenosis, lumbar region: Secondary | ICD-10-CM | POA: Insufficient documentation

## 2015-01-20 DIAGNOSIS — Z79899 Other long term (current) drug therapy: Secondary | ICD-10-CM | POA: Diagnosis not present

## 2015-01-20 DIAGNOSIS — Z5181 Encounter for therapeutic drug level monitoring: Secondary | ICD-10-CM | POA: Diagnosis not present

## 2015-01-20 DIAGNOSIS — M4126 Other idiopathic scoliosis, lumbar region: Secondary | ICD-10-CM | POA: Diagnosis not present

## 2015-01-20 MED ORDER — HYDROCODONE-ACETAMINOPHEN 10-325 MG PO TABS: 1.0000 | ORAL_TABLET | Freq: Four times a day (QID) | ORAL | Status: DC | PRN

## 2015-01-20 NOTE — Progress Notes (Signed)
Subjective:    Patient ID: Hannah Scott, female    DOB: 1951-03-23, 64 y.o.   MRN: [id number]  HPI: [name] is a 64 year old female who returns for follow up for chronic pain and medication refill. She says her pain is located in her lower back and right hip. Also states at times she has tingling in her right arm no pain at this moment. She rates her pain 6. Her current exercise regime is walking for short distances using her cadillac walker also using her stationary bicycle.   Pain Inventory Average Pain 7 Pain Right Now 6 My pain is constant, burning, tingling and aching  In the last 24 hours, has pain interfered with the following? General activity 10 Relation with others 10 Enjoyment of life 10 What TIME of day is your pain at its worst? daytime, evening, night Sleep (in general) Poor  Pain is worse with: walking and standing Pain improves with: rest and medication Relief from Meds: 6  Mobility walk with assistance use a cane use a walker ability to climb steps?  yes do you drive?  yes  Function disabled: date disabled . I need assistance with the following:  household duties  Neuro/Psych weakness numbness trouble walking  Prior Studies Any changes since last visit?  no  Physicians involved in your care Any changes since last visit?  no   Family History  Problem Relation Age of Onset  . Heart attack Father   . Heart disease Father   . Heart failure Mother   . Pneumonia Mother   . Hypertension Mother   . Heart disease Mother   . Stroke Maternal Grandfather   . Hypertension Maternal Grandfather   . Asthma Paternal Uncle     PAT UNCLES  . Heart attack Paternal Uncle    Social History   Social History  . Marital Status: Married    Spouse Name: N/A  . Number of Children: N/A  . Years of Education: N/A   Social History Main Topics  . Smoking status: Never Smoker   . Smokeless tobacco: Never Used  . Alcohol Use: No  . Drug Use: No  .  Sexual Activity: No   Other Topics Concern  . None   Social History Narrative   Past Surgical History  Procedure Laterality Date  . Cataract extraction Bilateral 01,03  . Cardiovascular stress test  09/07/05    Nuclear, was negative  . Breast lumpectomy  2003    radiation on right  . Appendectomy  1987    AT [facility]  . Total abdominal hysterectomy  1987    LSO, APPENDECTOMY  . Pelvic laparoscopy  1989    RSO, LYSIS OF ADHESIONS  . Oophorectomy      LSO -RSO  . Back surgery      Fusion  . Ankle surgery Left     ligament  . Total hip arthroplasty Right 10/28/2013    dr Lorin Mercy  . Total hip arthroplasty Right 10/28/2013    Procedure: TOTAL HIP ARTHROPLASTY ANTERIOR APPROACH;  Surgeon: Marybelle Killings, MD;  Location: [facility];  Service: Orthopedics;  Laterality: Right;  Right Total Hip Arthroplasty, Direct Anterior Approach   Past Medical History  Diagnosis Date  . Asthma     PFTs, February, 2011, moderate obstructive disease with response to bronchodilators, normal lung volumes, moderate reduction in diffusing capacity  . Obstructive airway disease   . Hyperlipidemia   . Hypertension   . Hypothyroidism  Patient has had in the past that she does not need treatment  . CAD (coronary artery disease)     90% distal LAD in the past  /   nuclear, 2008, no ischemia, ejection fraction 70%  . Atrial septal aneurysm     Echo, 2008-not noted on 13 echo  . Pinched nerve     lower back  . Kyphoscoliosis   . Shortness of breath   . Endometriosis 1989    RIGHT TUBE  . Endometriosis 1987    LEFT TUBE/OVARY W FOCAL IN-SITU ENDOMETRIAL ADENOCARCINOMA  . Cancer 2002    DUCTAL CIS--S/P LUMPECTOMY, RADIATION AND 6 WEEKS OF TAMOXIFEN  . Ejection fraction     EF 60%, echo, October, 2008  . Elevated CPK     January, 2014  . D-dimer, elevated     January, 2014  . Depression   . UTI (lower urinary tract infection)   . GERD (gastroesophageal reflux disease)     occ  . H/O hiatal hernia     ?  [name]  Arthritis   . Anemia     hx   BP 148/90 mmHg  Pulse 80  Resp 14  SpO2 96%  Opioid Risk Score:   Fall Risk Score:  `1  Depression screen PHQ 2/9  Depression screen PHQ 2/9 07/11/2014  Decreased Interest 3  Down, Depressed, Hopeless 2  PHQ - 2 Score 5  Altered sleeping 3  Tired, decreased energy 3  Change in appetite 0  Feeling bad or failure about yourself  0  Trouble concentrating 0  Moving slowly or fidgety/restless 0  Suicidal thoughts 0  PHQ-9 Score 11     Review of Systems  Musculoskeletal: Positive for gait problem.  Neurological: Positive for weakness and numbness.  All other systems reviewed and are negative.      Objective:   Physical Exam  Constitutional: She is oriented to person, place, and time. She appears well-developed and well-nourished.  HENT:  Head: Normocephalic and atraumatic.  Neck: Normal range of motion. Neck supple.  Cardiovascular: Normal rate and regular rhythm.   Pulmonary/Chest: Effort normal and breath sounds normal.  Musculoskeletal:  Normal Muscle Bulk and Muscle Testing Reveals:  Upper Extremities: Full ROM and Muscle Strength 5/5 Lumbar Scoliosis Lumbar Paraspinal Tenderness: L-3- L-5 Lower Extremities: Full ROM and Muscle Strength 5/5 Right Lower Extremity Flexion Produces Pain into Right Hip Arises from chair slowly/ Using cadillac walker for support Antalgic Gait    Neurological: She is alert and oriented to person, place, and time.  Skin: Skin is warm and dry.  Psychiatric: She has a normal mood and affect.  Nursing note and vitals reviewed.         Assessment & Plan:  1 Lumbar post laminectomy syndrome, s/p lumbar fusion:  Refilled: Hydrocodone 10/325mg  one tablet every 8 hours #90 2. Right Hip OA: S/P Right Hip Replacement 10/28/2013. 3.Depression: No Longer Taking  Zoloft. Has Family Support and Friends. Counseling with Doristine Bosworth. 4. Right Greater Trochanteric Tenderness: Continue with Ice and Heat Therapy.     20 minutes of face to face patient care time was spent during this visit. All questions were encouraged and answered.  F/U in 1 month

## 2015-01-28 ENCOUNTER — Other Ambulatory Visit: Payer: Self-pay

## 2015-01-28 DIAGNOSIS — I10 Essential (primary) hypertension: Secondary | ICD-10-CM

## 2015-01-28 MED ORDER — METOPROLOL TARTRATE 50 MG PO TABS: ORAL_TABLET | ORAL | Status: DC

## 2015-02-10 ENCOUNTER — Other Ambulatory Visit: Payer: Self-pay

## 2015-02-10 MED ORDER — GABAPENTIN 300 MG PO CAPS: 300.0000 mg | ORAL_CAPSULE | Freq: Every day | ORAL | Status: DC

## 2015-02-10 NOTE — Telephone Encounter (Signed)
Sent in Rx to Pharmacy.

## 2015-02-11 ENCOUNTER — Ambulatory Visit (INDEPENDENT_AMBULATORY_CARE_PROVIDER_SITE_OTHER): Payer: Medicare Other | Admitting: Internal Medicine

## 2015-02-11 ENCOUNTER — Ambulatory Visit (INDEPENDENT_AMBULATORY_CARE_PROVIDER_SITE_OTHER): Payer: Medicare Other | Admitting: Endocrinology

## 2015-02-11 ENCOUNTER — Encounter: Payer: Self-pay | Admitting: Internal Medicine

## 2015-02-11 ENCOUNTER — Encounter: Payer: Self-pay | Admitting: Endocrinology

## 2015-02-11 VITALS — BP 156/100 | HR 73 | Temp 98.3°F | Resp 14 | Wt 172.0 lb

## 2015-02-11 VITALS — BP 160/88 | HR 73 | Temp 98.0°F | Resp 18 | Wt 173.0 lb

## 2015-02-11 DIAGNOSIS — I10 Essential (primary) hypertension: Secondary | ICD-10-CM

## 2015-02-11 DIAGNOSIS — F329 Major depressive disorder, single episode, unspecified: Secondary | ICD-10-CM

## 2015-02-11 DIAGNOSIS — M4806 Spinal stenosis, lumbar region: Secondary | ICD-10-CM | POA: Diagnosis not present

## 2015-02-11 DIAGNOSIS — E039 Hypothyroidism, unspecified: Secondary | ICD-10-CM | POA: Diagnosis not present

## 2015-02-11 DIAGNOSIS — E785 Hyperlipidemia, unspecified: Secondary | ICD-10-CM | POA: Diagnosis not present

## 2015-02-11 DIAGNOSIS — M48062 Spinal stenosis, lumbar region with neurogenic claudication: Secondary | ICD-10-CM

## 2015-02-11 DIAGNOSIS — Z23 Encounter for immunization: Secondary | ICD-10-CM | POA: Diagnosis not present

## 2015-02-11 DIAGNOSIS — E042 Nontoxic multinodular goiter: Secondary | ICD-10-CM | POA: Diagnosis not present

## 2015-02-11 DIAGNOSIS — J4530 Mild persistent asthma, uncomplicated: Secondary | ICD-10-CM

## 2015-02-11 DIAGNOSIS — F32A Depression, unspecified: Secondary | ICD-10-CM

## 2015-02-11 DIAGNOSIS — G43909 Migraine, unspecified, not intractable, without status migrainosus: Secondary | ICD-10-CM

## 2015-02-11 DIAGNOSIS — D500 Iron deficiency anemia secondary to blood loss (chronic): Secondary | ICD-10-CM

## 2015-02-11 NOTE — Assessment & Plan Note (Signed)
Mild flare of asthma Likely related to post nasal drip, allergies Start zyrtec or other allergy medication

## 2015-02-11 NOTE — Assessment & Plan Note (Signed)
Secondary to hip replacement surgery July 2015 Will have CBC rechecked

## 2015-02-11 NOTE — Assessment & Plan Note (Signed)
Mild in nature Was on Zoloft and it did not help and she discontinued the medication Does not want to try a different medication

## 2015-02-11 NOTE — Progress Notes (Signed)
Subjective:    Patient ID: Hannah Scott, female    DOB: 08/27/1950, 64 y.o.   MRN: [id number]  HPI She is here to establish with a new pcp and for follow up of her routine medical problems.   Arthritis:  She states she was diagnosed with RA years ago. She knows she has OA.  She has chronic lower back pain.  She saw orthopedics and surgery is not an option.  She takes the hydrocodone helps some.  She walks with a walker.  She can not stand for long periods of time and has pain with walking.  She takes the gabapentin for her back pain.    Hip pain s/sp THR on the right.  The left hip has arthritis and does not hurt that much.  Her knees do not bother her.    Migraines headaches:  She has occasional migraines.  She takes otc medications and goes to a dark room.    Hypertension, CAD: She is taking her medication daily. She is compliant with a low sodium diet.  She denies chest pain, palpitations, edema, shortness of breath and regular headaches. She is exercising regularly.  She does  monitor her blood pressure at home and it is usually 130/80.  She has had two MIs.   She states they were spasms.  She does follow with a cardiologist.    Hypothyroidism:  She is taking all of her medications as prescribed.   Hyperlipidemia: She is taking her medication daily. She is compliant with a low fat/cholesterol diet. She is exercising regularly. She denies myalgias.   Asthma:  She has been having sob, wheezing and sinus drainage for for several weeks.  It is getting worse.  Using her nebulizer has helped and she used it a frew times.  The Breo she uses daily and she has used her resuce inhaler daily to every other day.  She has had a rare cough.  Depression:  She has some depression.  She gets down because of her physical limitations and she lost her husband.  Her sone recently moved away.  She was on zoloft and it did not help much.   Medications and allergies reviewed with patient and updated if  appropriate.  Patient Active Problem List   Diagnosis Date Noted  . Lumbar scoliosis 01/10/2014  . Hypocalcemia 01/03/2014  . Anemia due to blood loss 11/27/2013  . Edema 11/27/2013  . Elevated CPK   . D-dimer, elevated   . Multinodular goiter 12/01/2011  . Asthma   . Hypothyroidism   . CAD (coronary artery disease)   . Ejection fraction   . Atrial septal aneurysm   . Hyperlipidemia   . Hypertension   . Shortness of breath   . Scoliosis   . Spinal stenosis, lumbar region, with neurogenic claudication 07/01/2011  . Lumbar postlaminectomy syndrome 07/01/2011  . Osteoarthritis of right hip 07/01/2011  . Contracture of right hip 07/01/2011  . Endometriosis   . DEPRESSION 12/24/2009  . FATIGUE 12/24/2009  . MIGRAINES, HX OF 05/19/2006    Past Medical History  Diagnosis Date  . Asthma     PFTs, February, 2011, moderate obstructive disease with response to bronchodilators, normal lung volumes, moderate reduction in diffusing capacity  . Obstructive airway disease ([place])   . Hyperlipidemia   . Hypertension   . Hypothyroidism     Patient has had in the past that she does not need treatment  . CAD (coronary artery disease)  90% distal LAD in the past  /   nuclear, 2008, no ischemia, ejection fraction 70%  . Atrial septal aneurysm     Echo, 2008-not noted on 13 echo  . Pinched nerve     lower back  . Kyphoscoliosis   . Shortness of breath   . Endometriosis 1989    RIGHT TUBE  . Endometriosis 1987    LEFT TUBE/OVARY W FOCAL IN-SITU ENDOMETRIAL ADENOCARCINOMA  . Cancer ([place]) 2002    DUCTAL CIS--S/P LUMPECTOMY, RADIATION AND 6 WEEKS OF TAMOXIFEN  . Ejection fraction     EF 60%, echo, October, 2008  . Elevated CPK     January, 2014  . D-dimer, elevated     January, 2014  . Depression   . UTI (lower urinary tract infection)   . GERD (gastroesophageal reflux disease)     occ  . H/O hiatal hernia     ?  [name] Arthritis   . Anemia     hx    Past Surgical History    Procedure Laterality Date  . Cataract extraction Bilateral 01,03  . Cardiovascular stress test  09/07/05    Nuclear, was negative  . Breast lumpectomy  2003    radiation on right  . Appendectomy  1987    AT [facility]  . Total abdominal hysterectomy  1987    LSO, APPENDECTOMY  . Pelvic laparoscopy  1989    RSO, LYSIS OF ADHESIONS  . Oophorectomy      LSO -RSO  . Back surgery      Fusion  . Ankle surgery Left     ligament  . Total hip arthroplasty Right 10/28/2013    dr Lorin Mercy  . Total hip arthroplasty Right 10/28/2013    Procedure: TOTAL HIP ARTHROPLASTY ANTERIOR APPROACH;  Surgeon: Marybelle Killings, MD;  Location: [facility];  Service: Orthopedics;  Laterality: Right;  Right Total Hip Arthroplasty, Direct Anterior Approach    Social History   Social History  . Marital Status: Married    Spouse Name: N/A  . Number of Children: N/A  . Years of Education: N/A   Social History Main Topics  . Smoking status: Never Smoker   . Smokeless tobacco: Never Used  . Alcohol Use: No  . Drug Use: No  . Sexual Activity: No   Other Topics Concern  . None   Social History Narrative    Review of Systems  Constitutional: Negative for fever and chills.  HENT: Positive for congestion (occasional), postnasal drip and rhinorrhea. Negative for ear pain and sore throat.   Respiratory: Positive for shortness of breath and wheezing. Negative for cough.   Cardiovascular: Positive for palpitations (rare, transient). Negative for chest pain and leg swelling.  Gastrointestinal: Negative for abdominal pain, diarrhea, constipation and blood in stool.  Genitourinary: Negative for dysuria and hematuria.  Musculoskeletal: Positive for back pain and arthralgias.  Neurological: Positive for light-headedness (rare). Negative for dizziness and headaches.  Psychiatric/Behavioral: Positive for dysphoric mood. The patient is nervous/anxious.        Objective:   Filed Vitals:   02/11/15 1309  BP: 160/88  Pulse: 73   Temp: 98 F (36.7 C)  Resp: 18   Filed Weights   02/11/15 1309  Weight: 173 lb (78.472 kg)   Body mass index is 33.79 kg/(m^2).   Physical Exam  Constitutional: She appears well-developed and well-nourished. No distress.  HENT:  Head: Normocephalic and atraumatic.  Mouth/Throat: Oropharynx is clear and moist.  Eyes: Conjunctivae are  normal.  Neck: Neck supple. No tracheal deviation present. No thyromegaly present.  No carotid bruit  Cardiovascular: Normal rate and regular rhythm.   Murmur (1/6 systolic murmur) heard. Pulmonary/Chest: Effort normal and breath sounds normal. No respiratory distress. She has no wheezes.  Musculoskeletal: She exhibits no edema.  Lymphadenopathy:    She has no cervical adenopathy.  Skin: Skin is warm and dry.  Psychiatric: She has a normal mood and affect. Her behavior is normal.        Assessment & Plan:   Flu vaccine given today  Reviewed HM and discussed testing she should have done  See Problem List.  Follow up in 6 months

## 2015-02-11 NOTE — Progress Notes (Signed)
Subjective:    Patient ID: Hannah Scott, female    DOB: 11/05/1950, 64 y.o.   MRN: [id number]  HPI Pt returns for f/u of post-I-131 hypothyroidism (she had i-131 rx for hyperthyroidism, due to multinodular goiter, in September, 2013; in early 2014, TSH was high, and synthroid was started; f/u US in late 2015 showed the goiter was smaller). She does not notice the goiter.  pt reports hair loss Past Medical History  Diagnosis Date  . Asthma     PFTs, February, 2011, moderate obstructive disease with response to bronchodilators, normal lung volumes, moderate reduction in diffusing capacity  . Obstructive airway disease ([place])   . Hyperlipidemia   . Hypertension   . Hypothyroidism     Patient has had in the past that she does not need treatment  . CAD (coronary artery disease)     90% distal LAD in the past  /   nuclear, 2008, no ischemia, ejection fraction 70%  . Atrial septal aneurysm     Echo, 2008-not noted on 13 echo  . Pinched nerve     lower back  . Kyphoscoliosis   . Shortness of breath   . Endometriosis 1989    RIGHT TUBE  . Endometriosis 1987    LEFT TUBE/OVARY W FOCAL IN-SITU ENDOMETRIAL ADENOCARCINOMA  . Cancer ([place]) 2002    DUCTAL CIS--S/P LUMPECTOMY, RADIATION AND 6 WEEKS OF TAMOXIFEN  . Ejection fraction     EF 60%, echo, October, 2008  . Elevated CPK     January, 2014  . D-dimer, elevated     January, 2014  . Depression   . UTI (lower urinary tract infection)   . GERD (gastroesophageal reflux disease)     occ  . H/O hiatal hernia     ?  [name] Arthritis   . Anemia     hx    Past Surgical History  Procedure Laterality Date  . Cataract extraction Bilateral 01,03  . Cardiovascular stress test  09/07/05    Nuclear, was negative  . Breast lumpectomy  2003    radiation on right  . Appendectomy  1987    AT [facility]  . Total abdominal hysterectomy  1987    LSO, APPENDECTOMY  . Pelvic laparoscopy  1989    RSO, LYSIS OF ADHESIONS  . Oophorectomy      LSO -RSO  .  Back surgery      Fusion  . Ankle surgery Left     ligament  . Total hip arthroplasty Right 10/28/2013    dr Lorin Mercy  . Total hip arthroplasty Right 10/28/2013    Procedure: TOTAL HIP ARTHROPLASTY ANTERIOR APPROACH;  Surgeon: Marybelle Killings, MD;  Location: [facility];  Service: Orthopedics;  Laterality: Right;  Right Total Hip Arthroplasty, Direct Anterior Approach    Social History   Social History  . Marital Status: Married    Spouse Name: N/A  . Number of Children: N/A  . Years of Education: N/A   Occupational History  . Not on file.   Social History Main Topics  . Smoking status: Never Smoker   . Smokeless tobacco: Never Used  . Alcohol Use: No  . Drug Use: No  . Sexual Activity: No   Other Topics Concern  . Not on file   Social History Narrative    Current Outpatient Prescriptions on File Prior to Visit  Medication Sig Dispense Refill  . albuterol (PROAIR HFA) 108 (90 BASE) MCG/ACT inhaler Inhale 1-2 puffs into the  lungs every 6 (six) hours as needed. 1 Inhaler 2  . Albuterol Sulfate (PROAIR RESPICLICK) 169 (90 BASE) MCG/ACT AEPB Inhale 1-2 puffs into the lungs every 6 (six) hours as needed. 1 each 1  . aspirin EC 325 MG tablet Take 1 tablet (325 mg total) by mouth daily. 30 tablet 0  . atorvastatin (LIPITOR) 40 MG tablet TAKE ONE TABLET BY MOUTH AT BEDTIME 90 tablet 3  . benazepril (LOTENSIN) 40 MG tablet Take 1/2 tablet by mouth daily 30 tablet 5  . Fluticasone Furoate-Vilanterol 100-25 MCG/INH AEPB Inhale 1 puff into the lungs daily. 60 each 5  . gabapentin (NEURONTIN) 300 MG capsule Take 1 capsule (300 mg total) by mouth at bedtime. 30 capsule 5  . hydrochlorothiazide (MICROZIDE) 12.5 MG capsule Take 1 capsule (12.5 mg total) by mouth daily. 30 capsule 2  . HYDROcodone-acetaminophen (NORCO) 10-325 MG tablet Take 1 tablet by mouth every 6 (six) hours as needed. 90 tablet 0  . ipratropium-albuterol (DUONEB) 0.5-2.5 (3) MG/3ML SOLN Take 3 mLs by nebulization every 4 (four)  hours as needed. Must not be qid unless during exacerbation. Dx 493.90 360 mL 1  . metoprolol (LOPRESSOR) 50 MG tablet 1 q 12 hrs 60 tablet 5   No current facility-administered medications on file prior to visit.    Allergies  Allergen Reactions  . Norvasc [Amlodipine Besylate]     edema  . Tape     Prefers paper tape  . Fluticasone-Salmeterol     REACTION: headache.  She is tolerating Dulera well.    Family History  Problem Relation Age of Onset  . Heart attack Father   . Heart disease Father   . Heart failure Mother   . Pneumonia Mother   . Hypertension Mother   . Heart disease Mother   . Stroke Maternal Grandfather   . Hypertension Maternal Grandfather   . Asthma Paternal Uncle     PAT UNCLES  . Heart attack Paternal Uncle     BP 156/100 mmHg  Pulse 73  Temp(Src) 98.3 F (36.8 C) (Oral)  Resp 14  Wt 172 lb (78.019 kg)  SpO2 95%  Review of Systems She has weight gain.     Objective:   Physical Exam VITAL SIGNS:  See vs page GENERAL: no distress NECK: There is no palpable thyroid enlargement.  No thyroid nodule is palpable.  No palpable lymphadenopathy at the anterior neck.   Lab Results  Component Value Date   TSH 9.26* 02/12/2015      Assessment & Plan:  Post-I-131 hypothyroidism: she needs increased rx (pt requests lipid and bmet also) HTN: pt will see her new PCP today, to f/u this.    Patient is advised the following: Patient Instructions  blood tests are requested for you today.  We'll let you know about the results. Please come back for a follow-up appointment in 6 months.    Most of the time, your thyroid will start working more or less than it is now.  This means that your need for the levothyroxine will go up or down with time, bult seldom stays the same.     addendum: i have sent a prescription to your pharmacy, to increase synthroid.

## 2015-02-11 NOTE — Assessment & Plan Note (Addendum)
Follows with Dr. Loanne Drilling with endo Bloodwork and medication adjustment per endocrine

## 2015-02-11 NOTE — Assessment & Plan Note (Signed)
Occasional migraines Takes over-the-counter medications and goes to a dark room

## 2015-02-11 NOTE — Patient Instructions (Addendum)
blood tests are requested for you today.  We'll let you know about the results. Please come back for a follow-up appointment in 6 months.    Most of the time, your thyroid will start working more or less than it is now.  This means that your need for the levothyroxine will go up or down with time, bult seldom stays the same.

## 2015-02-11 NOTE — Assessment & Plan Note (Signed)
Blood pressure elevated here today, but he is controlled at home and usually runs 130/80 Continue current medications

## 2015-02-11 NOTE — Patient Instructions (Signed)
  We have reviewed your prior records including labs and tests today.  All other Health Maintenance issues reviewed.   Discussed getting a dexa, colonoscopy and shingles vaccine.  We can give you the prevnar vaccine at your next visit.   Flu vaccine administered today.   Medications reviewed and updated.  Try taking a daily anti-histamine to help with the asthma symptoms.  If they persist please call or return.   Please schedule followup in 6 months

## 2015-02-11 NOTE — Assessment & Plan Note (Signed)
Chronic pain in lower Uses walker to ambulate, unable to stand for long periods of time Has seen orthopedics in the past and surgery is not an option Takes hydrocodone as needed for pain Takes gabapentin nightly

## 2015-02-11 NOTE — Progress Notes (Signed)
Pre visit review using our clinic review tool, if applicable. No additional management support is needed unless otherwise documented below in the visit note. 

## 2015-02-12 ENCOUNTER — Other Ambulatory Visit (INDEPENDENT_AMBULATORY_CARE_PROVIDER_SITE_OTHER): Payer: Medicare Other

## 2015-02-12 DIAGNOSIS — E039 Hypothyroidism, unspecified: Secondary | ICD-10-CM | POA: Diagnosis not present

## 2015-02-12 DIAGNOSIS — E785 Hyperlipidemia, unspecified: Secondary | ICD-10-CM | POA: Diagnosis not present

## 2015-02-12 LAB — LIPID PANEL
CHOL/HDL RATIO: 2
Cholesterol: 120 mg/dL (ref 0–200)
HDL: 56.5 mg/dL (ref >39.00)
LDL CALC: 55 mg/dL (ref 0–99)
NonHDL: 63.92
TRIGLYCERIDES: 46 mg/dL (ref 0.0–149.0)
VLDL: 9.2 mg/dL (ref 0.0–40.0)

## 2015-02-12 LAB — BASIC METABOLIC PANEL
BUN: 9 mg/dL (ref 6–23)
CHLORIDE: 105 meq/L (ref 96–112)
CO2: 31 meq/L (ref 19–32)
Calcium: 9.3 mg/dL (ref 8.4–10.5)
Creatinine, Ser: 0.64 mg/dL (ref 0.40–1.20)
GFR: 98.82 mL/min (ref >60.00)
Glucose, Bld: 94 mg/dL (ref 70–99)
Potassium: 5 mEq/L (ref 3.5–5.1)
SODIUM: 142 meq/L (ref 135–145)

## 2015-02-12 LAB — TSH: TSH: 9.26 u[IU]/mL — AB (ref 0.35–4.50)

## 2015-02-12 MED ORDER — LEVOTHYROXINE SODIUM 75 MCG PO TABS: 75.0000 ug | ORAL_TABLET | Freq: Every day | ORAL | Status: DC

## 2015-02-19 ENCOUNTER — Encounter: Payer: Medicare Other | Attending: Physical Medicine & Rehabilitation | Admitting: Registered Nurse

## 2015-02-19 ENCOUNTER — Encounter: Payer: Self-pay | Admitting: Registered Nurse

## 2015-02-19 VITALS — BP 152/81 | HR 90 | Resp 14

## 2015-02-19 DIAGNOSIS — M24551 Contracture, right hip: Secondary | ICD-10-CM | POA: Diagnosis not present

## 2015-02-19 DIAGNOSIS — M1611 Unilateral primary osteoarthritis, right hip: Secondary | ICD-10-CM | POA: Diagnosis not present

## 2015-02-19 DIAGNOSIS — Z79899 Other long term (current) drug therapy: Secondary | ICD-10-CM

## 2015-02-19 DIAGNOSIS — M4806 Spinal stenosis, lumbar region: Secondary | ICD-10-CM | POA: Insufficient documentation

## 2015-02-19 DIAGNOSIS — M419 Scoliosis, unspecified: Secondary | ICD-10-CM

## 2015-02-19 DIAGNOSIS — M961 Postlaminectomy syndrome, not elsewhere classified: Secondary | ICD-10-CM

## 2015-02-19 DIAGNOSIS — Z5181 Encounter for therapeutic drug level monitoring: Secondary | ICD-10-CM | POA: Diagnosis not present

## 2015-02-19 DIAGNOSIS — M4126 Other idiopathic scoliosis, lumbar region: Secondary | ICD-10-CM | POA: Diagnosis not present

## 2015-02-19 MED ORDER — HYDROCODONE-ACETAMINOPHEN 10-325 MG PO TABS: 1.0000 | ORAL_TABLET | Freq: Four times a day (QID) | ORAL | Status: DC | PRN

## 2015-02-19 NOTE — Progress Notes (Signed)
Subjective:    Patient ID: Hannah Scott, female    DOB: 10/15/1950, 64 y.o.   MRN: [id number]  HPI: Hannah Scott is a 64 year old female who returns for follow up for chronic pain and medication refill. She says her pain is located in her lower back and right hip. She rates her pain 6. Her current exercise regime is walking for short distances using her cadillac walker also using her stationary bicycle.  Pain Inventory Average Pain 7 Pain Right Now 6 My pain is constant, burning, tingling and aching  In the last 24 hours, has pain interfered with the following? General activity 10 Relation with others 10 Enjoyment of life 10 What TIME of day is your pain at its worst? NA Sleep (in general) Poor  Pain is worse with: walking and standing Pain improves with: rest and medication Relief from Meds: 4  Mobility walk with assistance use a cane use a walker ability to climb steps?  yes do you drive?  yes  Function disabled: date disabled NA  Neuro/Psych weakness numbness tingling depression  Prior Studies Any changes since last visit?  no  Physicians involved in your care Any changes since last visit?  no   Family History  Problem Relation Age of Onset  . Heart attack Father   . Heart disease Father   . Heart failure Mother   . Pneumonia Mother   . Hypertension Mother   . Heart disease Mother   . Stroke Maternal Grandfather   . Hypertension Maternal Grandfather   . Asthma Paternal Uncle     PAT UNCLES  . Heart attack Paternal Uncle    Social History   Social History  . Marital Status: Married    Spouse Name: N/A  . Number of Children: N/A  . Years of Education: N/A   Social History Main Topics  . Smoking status: Never Smoker   . Smokeless tobacco: Never Used  . Alcohol Use: No  . Drug Use: No  . Sexual Activity: No   Other Topics Concern  . None   Social History Narrative   Past Surgical History  Procedure Laterality Date  . Cataract  extraction Bilateral 01,03  . Cardiovascular stress test  09/07/05    Nuclear, was negative  . Breast lumpectomy  2003    radiation on right  . Appendectomy  1987    AT [facility]  . Total abdominal hysterectomy  1987    LSO, APPENDECTOMY  . Pelvic laparoscopy  1989    RSO, LYSIS OF ADHESIONS  . Oophorectomy      LSO -RSO  . Back surgery      Fusion  . Ankle surgery Left     ligament  . Total hip arthroplasty Right 10/28/2013    dr Lorin Mercy  . Total hip arthroplasty Right 10/28/2013    Procedure: TOTAL HIP ARTHROPLASTY ANTERIOR APPROACH;  Surgeon: Marybelle Killings, MD;  Location: [facility];  Service: Orthopedics;  Laterality: Right;  Right Total Hip Arthroplasty, Direct Anterior Approach   Past Medical History  Diagnosis Date  . Asthma     PFTs, February, 2011, moderate obstructive disease with response to bronchodilators, normal lung volumes, moderate reduction in diffusing capacity  . Obstructive airway disease ([place])   . Hyperlipidemia   . Hypertension   . Hypothyroidism     Patient has had in the past that she does not need treatment  . CAD (coronary artery disease)     90%  distal LAD in the past  /   nuclear, 2008, no ischemia, ejection fraction 70%  . Atrial septal aneurysm     Echo, 2008-not noted on 13 echo  . Pinched nerve     lower back  . Kyphoscoliosis   . Shortness of breath   . Endometriosis 1989    RIGHT TUBE  . Endometriosis 1987    LEFT TUBE/OVARY W FOCAL IN-SITU ENDOMETRIAL ADENOCARCINOMA  . Cancer ([place]) 2002    DUCTAL CIS--S/P LUMPECTOMY, RADIATION AND 6 WEEKS OF TAMOXIFEN  . Ejection fraction     EF 60%, echo, October, 2008  . Elevated CPK     January, 2014  . D-dimer, elevated     January, 2014  . Depression   . UTI (lower urinary tract infection)   . GERD (gastroesophageal reflux disease)     occ  . H/O hiatal hernia     ?  [name] Arthritis   . Anemia     hx   BP 152/81 mmHg  Pulse 90  Resp 14  SpO2 96%  Opioid Risk Score:   Fall Risk Score:   `1  Depression screen PHQ 2/9  Depression screen PHQ 2/9 07/11/2014  Decreased Interest 3  Down, Depressed, Hopeless 2  PHQ - 2 Score 5  Altered sleeping 3  Tired, decreased energy 3  Change in appetite 0  Feeling bad or failure about yourself  0  Trouble concentrating 0  Moving slowly or fidgety/restless 0  Suicidal thoughts 0  PHQ-9 Score 11     Review of Systems  Neurological: Positive for weakness and numbness.       Tingling  Psychiatric/Behavioral:       Depression  All other systems reviewed and are negative.      Objective:   Physical Exam  Constitutional: She is oriented to person, place, and time. She appears well-developed and well-nourished.  HENT:  Head: Normocephalic and atraumatic.  Neck: Normal range of motion. Neck supple.  Pulmonary/Chest: Effort normal and breath sounds normal.  Musculoskeletal:  Normal Muscle Bulk and Muscle Testing Reveals:  Upper Extremities: Full ROM and Muscle Strength 5/5 Lumbar Scoliosis Lumbar Paraspinal Tenderness: L-3- L-5 Lower Extremities: Full ROM and Muscle Strength 5/5 Arises from chair slowly/ Using cadillac walker for support Antalgic gait  Neurological: She is alert and oriented to person, place, and time.  Skin: Skin is warm and dry.  Psychiatric: She has a normal mood and affect.  Nursing note and vitals reviewed.         Assessment & Plan:  1 Lumbar post laminectomy syndrome, s/p lumbar fusion:  Refilled: Hydrocodone 10/325mg  one tablet every 8 hours #90 2. Right Hip OA: S/P Right Hip Replacement 10/28/2013. 3.Depression: No Longer Taking Zoloft. Has Family Support and Friends. Counseling with Doristine Bosworth. 4. Right Greater Trochanteric Tenderness: Continue with Ice and Heat Therapy.   20 minutes of face to face patient care time was spent during this visit. All questions were encouraged and answered.  F/U in 1 month

## 2015-03-17 ENCOUNTER — Encounter: Payer: Self-pay | Admitting: Physical Medicine & Rehabilitation

## 2015-03-17 ENCOUNTER — Ambulatory Visit (HOSPITAL_BASED_OUTPATIENT_CLINIC_OR_DEPARTMENT_OTHER): Payer: Medicare Other | Admitting: Physical Medicine & Rehabilitation

## 2015-03-17 ENCOUNTER — Encounter: Payer: Medicare Other | Attending: Physical Medicine & Rehabilitation

## 2015-03-17 VITALS — BP 146/83 | HR 113

## 2015-03-17 DIAGNOSIS — M961 Postlaminectomy syndrome, not elsewhere classified: Secondary | ICD-10-CM | POA: Insufficient documentation

## 2015-03-17 DIAGNOSIS — M4126 Other idiopathic scoliosis, lumbar region: Secondary | ICD-10-CM | POA: Diagnosis not present

## 2015-03-17 DIAGNOSIS — M48062 Spinal stenosis, lumbar region with neurogenic claudication: Secondary | ICD-10-CM

## 2015-03-17 DIAGNOSIS — M1611 Unilateral primary osteoarthritis, right hip: Secondary | ICD-10-CM | POA: Diagnosis not present

## 2015-03-17 DIAGNOSIS — M4806 Spinal stenosis, lumbar region: Secondary | ICD-10-CM | POA: Diagnosis not present

## 2015-03-17 DIAGNOSIS — M419 Scoliosis, unspecified: Secondary | ICD-10-CM

## 2015-03-17 MED ORDER — HYDROCODONE-ACETAMINOPHEN 10-325 MG PO TABS: 1.0000 | ORAL_TABLET | Freq: Four times a day (QID) | ORAL | Status: DC | PRN

## 2015-03-17 NOTE — Progress Notes (Signed)
Subjective:    Patient ID: Hannah Scott, female    DOB: 10-15-1950, 64 y.o.   MRN: [id number]  HPI 64 year old female with history of lumbar kyphoscoliosis. She has undergone decompression of lumbar spine. In addition she's had right hip osteoarthritis and has undergone right total hip replacement by Dr. Lorin Mercy Approximately 18 months ago. Patient states that when she is not active, the 3 hydrocodone 10 mg tablets per day is helpful. When she is more active and doing housework or going shopping she needs some extra pain medication. Patient continues to take gabapentin at night for her shooting pains in the legs. Pain Inventory Average Pain 8 Pain Right Now 7 My pain is constant, sharp, tingling and aching  In the last 24 hours, has pain interfered with the following? General activity 10 Relation with others 10 Enjoyment of life 10 What TIME of day is your pain at its worst? NA Sleep (in general) NA  Pain is worse with: walking and standing Pain improves with: rest and medication Relief from Meds: 5  Mobility walk with assistance use a cane use a walker ability to climb steps?  yes do you drive?  yes  Function disabled: date disabled NA I need assistance with the following:  household duties  Neuro/Psych weakness numbness trouble walking  Prior Studies Any changes since last visit?  no  Physicians involved in your care Any changes since last visit?  no   Family History  Problem Relation Age of Onset  . Heart attack Father   . Heart disease Father   . Heart failure Mother   . Pneumonia Mother   . Hypertension Mother   . Heart disease Mother   . Stroke Maternal Grandfather   . Hypertension Maternal Grandfather   . Asthma Paternal Uncle     PAT UNCLES  . Heart attack Paternal Uncle    Social History   Social History  . Marital Status: Married    Spouse Name: N/A  . Number of Children: N/A  . Years of Education: N/A   Social History Main Topics  .  Smoking status: Never Smoker   . Smokeless tobacco: Never Used  . Alcohol Use: No  . Drug Use: No  . Sexual Activity: No   Other Topics Concern  . None   Social History Narrative   Past Surgical History  Procedure Laterality Date  . Cataract extraction Bilateral 01,03  . Cardiovascular stress test  09/07/05    Nuclear, was negative  . Breast lumpectomy  2003    radiation on right  . Appendectomy  1987    AT [facility]  . Total abdominal hysterectomy  1987    LSO, APPENDECTOMY  . Pelvic laparoscopy  1989    RSO, LYSIS OF ADHESIONS  . Oophorectomy      LSO -RSO  . Back surgery      Fusion  . Ankle surgery Left     ligament  . Total hip arthroplasty Right 10/28/2013    dr Lorin Mercy  . Total hip arthroplasty Right 10/28/2013    Procedure: TOTAL HIP ARTHROPLASTY ANTERIOR APPROACH;  Surgeon: Marybelle Killings, MD;  Location: [facility];  Service: Orthopedics;  Laterality: Right;  Right Total Hip Arthroplasty, Direct Anterior Approach   Past Medical History  Diagnosis Date  . Asthma     PFTs, February, 2011, moderate obstructive disease with response to bronchodilators, normal lung volumes, moderate reduction in diffusing capacity  . Obstructive airway disease ([place])   . Hyperlipidemia   .  Hypertension   . Hypothyroidism     Patient has had in the past that she does not need treatment  . CAD (coronary artery disease)     90% distal LAD in the past  /   nuclear, 2008, no ischemia, ejection fraction 70%  . Atrial septal aneurysm     Echo, 2008-not noted on 13 echo  . Pinched nerve     lower back  . Kyphoscoliosis   . Shortness of breath   . Endometriosis 1989    RIGHT TUBE  . Endometriosis 1987    LEFT TUBE/OVARY W FOCAL IN-SITU ENDOMETRIAL ADENOCARCINOMA  . Cancer ([place]) 2002    DUCTAL CIS--S/P LUMPECTOMY, RADIATION AND 6 WEEKS OF TAMOXIFEN  . Ejection fraction     EF 60%, echo, October, 2008  . Elevated CPK     January, 2014  . D-dimer, elevated     January, 2014  . Depression   . UTI  (lower urinary tract infection)   . GERD (gastroesophageal reflux disease)     occ  . H/O hiatal hernia     ?  [name] Arthritis   . Anemia     hx   BP 146/83 mmHg  Pulse 113  SpO2 97%  Opioid Risk Score:   Fall Risk Score:  `1  Depression screen PHQ 2/9  Depression screen PHQ 2/9 07/11/2014  Decreased Interest 3  Down, Depressed, Hopeless 2  PHQ - 2 Score 5  Altered sleeping 3  Tired, decreased energy 3  Change in appetite 0  Feeling bad or failure about yourself  0  Trouble concentrating 0  Moving slowly or fidgety/restless 0  Suicidal thoughts 0  PHQ-9 Score 11     Review of Systems  All other systems reviewed and are negative.      Objective:   Physical Exam  Constitutional: She appears well-developed and well-nourished.  HENT:  Head: Normocephalic and atraumatic.  Eyes: Conjunctivae and EOM are normal. Pupils are equal, round, and reactive to light.  Neck: Normal range of motion.  Neurological: She is alert.  Psychiatric: She has a normal mood and affect.  Nursing note and vitals reviewed.   Thoracic kyphosis, dextroconvex scoliosis in the upper lumbar and lower thoracic area Difficulty laying supine. Bilateral hip flexion contracture approximately 15 Decreased left hip internal rotation Knee extension is full, knee flexion is partial active but passively she can get to full range. Numbness over the right lateral thigh.    Assessment & Plan:  1. Lumbar postlaminectomy syndrome with chronic axial pain as well as radicular pain. She is taking hydrocodone 10 over 3 times a day but has days where she needs to take 4 tablets per day. With this reason we will go up to 100 tablets per month which will give her 10 days per month when she can take 4 tablets per day. Continue gabapentin at bedtime 2. Right greater than left hip contracture status post right THR, this is contributing to her overall disability 3. Severe kyphoscoliosis. Ambulating with a walker. Limited  functional mobility. Anticipate need for wheelchair in the next year or so

## 2015-03-17 NOTE — Patient Instructions (Signed)
Trigger Finger °Trigger finger (digital tendinitis and stenosing tenosynovitis) is a common disorder that causes an often painful catching of the fingers or thumb. It occurs as a clicking, snapping, or locking of a finger in the palm of the hand. This is caused by a problem with the tendons that flex or bend the fingers sliding smoothly through their sheaths. The condition may occur in any finger or a couple fingers at the same time.  °The finger may lock with the finger curled or suddenly straighten out with a snap. This is more common in patients with rheumatoid arthritis and diabetes. Left untreated, the condition may get worse to the point where the finger becomes locked in flexion, like making a fist, or less commonly locked with the finger straightened out. °CAUSES  °· Inflammation and scarring that lead to swelling around the tendon sheath. °· Repeated or forceful movements. °· Rheumatoid arthritis, an autoimmune disease that affects joints. °· Gout. °· Diabetes mellitus. °SIGNS AND SYMPTOMS °· Soreness and swelling of your finger. °· A painful clicking or snapping as you bend and straighten your finger. °DIAGNOSIS  °Your health care provider will do a physical exam of your finger to diagnose trigger finger. °TREATMENT  °· Splinting for 6-8 weeks may be helpful. °· Nonsteroidal anti-inflammatory medicines (NSAIDs) can help to relieve the pain and inflammation. °· Cortisone injections, along with splinting, may speed up recovery. Several injections may be required. Cortisone may give relief after one injection. °· Surgery is another treatment that may be used if conservative treatments do not work. Surgery can be minor, without incisions (a cut does not have to be made), and can be done with a needle through the skin. °· Other surgical choices involve an open procedure in which the surgeon opens the hand through a small incision and cuts the pulley so the tendon can again slide smoothly. Your hand will still  work fine. °HOME CARE INSTRUCTIONS °· Apply ice to the injured area, twice per day: °¨ Put ice in a plastic bag. °¨ Place a towel between your skin and the bag. °¨ Leave the ice on for 20 minutes, 3-4 times a day. °· Rest your hand often. °MAKE SURE YOU:  °· Understand these instructions. °· Will watch your condition. °· Will get help right away if you are not doing well or get worse. °  °This information is not intended to replace advice given to you by your health care provider. Make sure you discuss any questions you have with your health care provider. °  °Document Released: 01/30/2004 Document Revised: 12/12/2012 Document Reviewed: 09/11/2012 °Elsevier Interactive Patient Education ©2016 [employer] ° °

## 2015-04-08 LAB — HM MAMMOGRAPHY: HM Mammogram: NEGATIVE

## 2015-04-13 ENCOUNTER — Encounter: Payer: Self-pay | Admitting: Registered Nurse

## 2015-04-13 ENCOUNTER — Encounter: Payer: Medicare Other | Attending: Physical Medicine & Rehabilitation | Admitting: Registered Nurse

## 2015-04-13 VITALS — BP 159/77 | HR 62

## 2015-04-13 DIAGNOSIS — M4126 Other idiopathic scoliosis, lumbar region: Secondary | ICD-10-CM | POA: Insufficient documentation

## 2015-04-13 DIAGNOSIS — M419 Scoliosis, unspecified: Secondary | ICD-10-CM | POA: Diagnosis not present

## 2015-04-13 DIAGNOSIS — M1611 Unilateral primary osteoarthritis, right hip: Secondary | ICD-10-CM | POA: Insufficient documentation

## 2015-04-13 DIAGNOSIS — M4806 Spinal stenosis, lumbar region: Secondary | ICD-10-CM | POA: Diagnosis not present

## 2015-04-13 DIAGNOSIS — M961 Postlaminectomy syndrome, not elsewhere classified: Secondary | ICD-10-CM | POA: Insufficient documentation

## 2015-04-13 DIAGNOSIS — Z79899 Other long term (current) drug therapy: Secondary | ICD-10-CM

## 2015-04-13 DIAGNOSIS — M24551 Contracture, right hip: Secondary | ICD-10-CM | POA: Diagnosis not present

## 2015-04-13 DIAGNOSIS — M48062 Spinal stenosis, lumbar region with neurogenic claudication: Secondary | ICD-10-CM

## 2015-04-13 DIAGNOSIS — Z5181 Encounter for therapeutic drug level monitoring: Secondary | ICD-10-CM

## 2015-04-13 MED ORDER — HYDROCODONE-ACETAMINOPHEN 10-325 MG PO TABS: 1.0000 | ORAL_TABLET | Freq: Four times a day (QID) | ORAL | Status: DC | PRN

## 2015-04-13 NOTE — Progress Notes (Signed)
Subjective:    Patient ID: Hannah Scott, female    DOB: [birth year], 64 y.o.   MRN: [id number]  HPI: [name] is a 64 year old female who returns for follow up for chronic pain and medication refill. She says her pain is located in her lower back and right hip. She rates her pain 7. Her current exercise regime is walking for short distances using her cadillac walker also using her stationary bicycle.  Pain Inventory Average Pain 8 Pain Right Now 7 My pain is constant, burning, tingling and aching  In the last 24 hours, has pain interfered with the following? General activity 10 Relation with others 10 Enjoyment of life 10 What TIME of day is your pain at its worst? all Sleep (in general) Poor  Pain is worse with: walking and standing Pain improves with: rest and medication Relief from Meds: 4  Mobility use a cane use a walker ability to climb steps?  yes do you drive?  yes  Function disabled: date disabled . I need assistance with the following:  household duties  Neuro/Psych weakness numbness tingling trouble walking  Prior Studies Any changes since last visit?  no  Physicians involved in your care Any changes since last visit?  no   Family History  Problem Relation Age of Onset  . Heart attack Father   . Heart disease Father   . Heart failure Mother   . Pneumonia Mother   . Hypertension Mother   . Heart disease Mother   . Stroke Maternal Grandfather   . Hypertension Maternal Grandfather   . Asthma Paternal Uncle     PAT UNCLES  . Heart attack Paternal Uncle    Social History   Social History  . Marital Status: Married    Spouse Name: N/A  . Number of Children: N/A  . Years of Education: N/A   Social History Main Topics  . Smoking status: Never Smoker   . Smokeless tobacco: Never Used  . Alcohol Use: No  . Drug Use: No  . Sexual Activity: No   Other Topics Concern  . None   Social History Narrative   Past Surgical History    Procedure Laterality Date  . Cataract extraction Bilateral 01,03  . Cardiovascular stress test  09/07/05    Nuclear, was negative  . Breast lumpectomy  2003    radiation on right  . Appendectomy  1987    AT [facility]  . Total abdominal hysterectomy  1987    LSO, APPENDECTOMY  . Pelvic laparoscopy  1989    RSO, LYSIS OF ADHESIONS  . Oophorectomy      LSO -RSO  . Back surgery      Fusion  . Ankle surgery Left     ligament  . Total hip arthroplasty Right 10/28/2013    dr Lorin Mercy  . Total hip arthroplasty Right 10/28/2013    Procedure: TOTAL HIP ARTHROPLASTY ANTERIOR APPROACH;  Surgeon: Marybelle Killings, MD;  Location: [place];  Service: Orthopedics;  Laterality: Right;  Right Total Hip Arthroplasty, Direct Anterior Approach   Past Medical History  Diagnosis Date  . Asthma     PFTs, February, 2011, moderate obstructive disease with response to bronchodilators, normal lung volumes, moderate reduction in diffusing capacity  . Obstructive airway disease ([place])   . Hyperlipidemia   . Hypertension   . Hypothyroidism     Patient has had in the past that she does not need treatment  . CAD (  coronary artery disease)     90% distal LAD in the past  /   nuclear, 2008, no ischemia, ejection fraction 70%  . Atrial septal aneurysm     Echo, 2008-not noted on 13 echo  . Pinched nerve     lower back  . Kyphoscoliosis   . Shortness of breath   . Endometriosis 1989    RIGHT TUBE  . Endometriosis 1987    LEFT TUBE/OVARY W FOCAL IN-SITU ENDOMETRIAL ADENOCARCINOMA  . Cancer ([place]) 2002    DUCTAL CIS--S/P LUMPECTOMY, RADIATION AND 6 WEEKS OF TAMOXIFEN  . Ejection fraction     EF 60%, echo, October, 2008  . Elevated CPK     January, 2014  . D-dimer, elevated     January, 2014  . Depression   . UTI (lower urinary tract infection)   . GERD (gastroesophageal reflux disease)     occ  . H/O hiatal hernia     ?  [name] Arthritis   . Anemia     hx   BP 159/77 mmHg  Pulse 62  Opioid Risk Score:   Fall Risk  Score:  `1  Depression screen PHQ 2/9  Depression screen PHQ 2/9 07/11/2014  Decreased Interest 3  Down, Depressed, Hopeless 2  PHQ - 2 Score 5  Altered sleeping 3  Tired, decreased energy 3  Change in appetite 0  Feeling bad or failure about yourself  0  Trouble concentrating 0  Moving slowly or fidgety/restless 0  Suicidal thoughts 0  PHQ-9 Score 11     Review of Systems  All other systems reviewed and are negative.      Objective:   Physical Exam  Constitutional: She is oriented to person, place, and time. She appears well-developed and well-nourished.  HENT:  Head: Normocephalic and atraumatic.  Neck: Normal range of motion. Neck supple.  Cardiovascular: Normal rate and regular rhythm.   Pulmonary/Chest: Effort normal and breath sounds normal.  Musculoskeletal:  Normal Muscle Bulk and Muscle Testing Reveals: Upper Extremities: Full ROM and Muscle Strength 5/5 Lumbar Paraspinal Tenderness: L-3- L-5 Lumbar Scoliosis Lower Extremities: Full ROM and Muscle Strength 5/5 Arises from chair slolwy using walker for support Antalgic gait  Neurological: She is alert and oriented to person, place, and time.  Skin: Skin is warm and dry.  Psychiatric: She has a normal mood and affect.  Nursing note and vitals reviewed.         Assessment & Plan:  1 Lumbar post laminectomy syndrome, s/p lumbar fusion:  Refilled: Hydrocodone 10/325mg  one tablet every 8 hours #100 2. Right Hip OA: S/P Right Hip Replacement 10/28/2013. 3.Depression: No Longer Taking Zoloft. Has Family Support and Friends. Counseling with Doristine Bosworth. 4. Right Greater Trochanteric Tenderness: Continue with Ice and Heat Therapy.   20 minutes of face to face patient care time was spent during this visit. All questions were encouraged and answered.  F/U in 1 month

## 2015-04-16 ENCOUNTER — Encounter: Payer: Self-pay | Admitting: Internal Medicine

## 2015-04-22 ENCOUNTER — Ambulatory Visit (INDEPENDENT_AMBULATORY_CARE_PROVIDER_SITE_OTHER): Payer: Medicare Other | Admitting: Emergency Medicine

## 2015-04-22 ENCOUNTER — Emergency Department (HOSPITAL_COMMUNITY): Payer: Medicare Other

## 2015-04-22 ENCOUNTER — Inpatient Hospital Stay (HOSPITAL_COMMUNITY)
Admission: EM | Admit: 2015-04-22 | Discharge: 2015-04-28 | DRG: 189 | Disposition: A | Discharge status: Home / Self Care | Payer: Medicare Other | Attending: Internal Medicine | Admitting: Internal Medicine

## 2015-04-22 ENCOUNTER — Encounter (HOSPITAL_COMMUNITY): Payer: Self-pay | Admitting: *Deleted

## 2015-04-22 VITALS — BP 132/84 | HR 110 | Temp 98.7°F | Resp 30

## 2015-04-22 DIAGNOSIS — E039 Hypothyroidism, unspecified: Secondary | ICD-10-CM | POA: Diagnosis present

## 2015-04-22 DIAGNOSIS — I119 Hypertensive heart disease without heart failure: Secondary | ICD-10-CM | POA: Diagnosis present

## 2015-04-22 DIAGNOSIS — J4542 Moderate persistent asthma with status asthmaticus: Secondary | ICD-10-CM

## 2015-04-22 DIAGNOSIS — Z96641 Presence of right artificial hip joint: Secondary | ICD-10-CM | POA: Diagnosis present

## 2015-04-22 DIAGNOSIS — N809 Endometriosis, unspecified: Secondary | ICD-10-CM | POA: Diagnosis present

## 2015-04-22 DIAGNOSIS — Z823 Family history of stroke: Secondary | ICD-10-CM

## 2015-04-22 DIAGNOSIS — J9601 Acute respiratory failure with hypoxia: Secondary | ICD-10-CM | POA: Diagnosis not present

## 2015-04-22 DIAGNOSIS — J45902 Unspecified asthma with status asthmaticus: Secondary | ICD-10-CM | POA: Diagnosis present

## 2015-04-22 DIAGNOSIS — I10 Essential (primary) hypertension: Secondary | ICD-10-CM | POA: Diagnosis not present

## 2015-04-22 DIAGNOSIS — K219 Gastro-esophageal reflux disease without esophagitis: Secondary | ICD-10-CM | POA: Diagnosis present

## 2015-04-22 DIAGNOSIS — Z825 Family history of asthma and other chronic lower respiratory diseases: Secondary | ICD-10-CM

## 2015-04-22 DIAGNOSIS — Z7982 Long term (current) use of aspirin: Secondary | ICD-10-CM | POA: Diagnosis not present

## 2015-04-22 DIAGNOSIS — J101 Influenza due to other identified influenza virus with other respiratory manifestations: Secondary | ICD-10-CM

## 2015-04-22 DIAGNOSIS — Z8249 Family history of ischemic heart disease and other diseases of the circulatory system: Secondary | ICD-10-CM | POA: Diagnosis not present

## 2015-04-22 DIAGNOSIS — Z923 Personal history of irradiation: Secondary | ICD-10-CM | POA: Diagnosis not present

## 2015-04-22 DIAGNOSIS — F329 Major depressive disorder, single episode, unspecified: Secondary | ICD-10-CM | POA: Diagnosis present

## 2015-04-22 DIAGNOSIS — I2510 Atherosclerotic heart disease of native coronary artery without angina pectoris: Secondary | ICD-10-CM | POA: Diagnosis present

## 2015-04-22 DIAGNOSIS — M419 Scoliosis, unspecified: Secondary | ICD-10-CM | POA: Diagnosis present

## 2015-04-22 DIAGNOSIS — R0602 Shortness of breath: Secondary | ICD-10-CM | POA: Diagnosis present

## 2015-04-22 DIAGNOSIS — J9602 Acute respiratory failure with hypercapnia: Secondary | ICD-10-CM | POA: Diagnosis present

## 2015-04-22 DIAGNOSIS — M549 Dorsalgia, unspecified: Secondary | ICD-10-CM | POA: Diagnosis present

## 2015-04-22 DIAGNOSIS — E785 Hyperlipidemia, unspecified: Secondary | ICD-10-CM | POA: Diagnosis present

## 2015-04-22 DIAGNOSIS — J45909 Unspecified asthma, uncomplicated: Secondary | ICD-10-CM | POA: Diagnosis present

## 2015-04-22 DIAGNOSIS — I253 Aneurysm of heart: Secondary | ICD-10-CM | POA: Diagnosis present

## 2015-04-22 DIAGNOSIS — R000 Tachycardia, unspecified: Secondary | ICD-10-CM | POA: Diagnosis present

## 2015-04-22 DIAGNOSIS — G8929 Other chronic pain: Secondary | ICD-10-CM | POA: Diagnosis present

## 2015-04-22 DIAGNOSIS — M1990 Unspecified osteoarthritis, unspecified site: Secondary | ICD-10-CM | POA: Diagnosis present

## 2015-04-22 DIAGNOSIS — J45901 Unspecified asthma with (acute) exacerbation: Secondary | ICD-10-CM | POA: Diagnosis not present

## 2015-04-22 DIAGNOSIS — F32A Depression, unspecified: Secondary | ICD-10-CM | POA: Diagnosis present

## 2015-04-22 DIAGNOSIS — Z853 Personal history of malignant neoplasm of breast: Secondary | ICD-10-CM | POA: Diagnosis not present

## 2015-04-22 LAB — BASIC METABOLIC PANEL
Anion gap: 15 (ref 5–15)
BUN: 9 mg/dL (ref 6–20)
CHLORIDE: 105 mmol/L (ref 101–111)
CO2: 21 mmol/L — ABNORMAL LOW (ref 22–32)
CREATININE: 0.85 mg/dL (ref 0.44–1.00)
Calcium: 9.4 mg/dL (ref 8.9–10.3)
GFR calc non Af Amer: >60 mL/min (ref >60)
Glucose, Bld: 273 mg/dL — ABNORMAL HIGH (ref 65–99)
POTASSIUM: 3.4 mmol/L — AB (ref 3.5–5.1)
SODIUM: 141 mmol/L (ref 135–145)

## 2015-04-22 LAB — CBC WITH DIFFERENTIAL/PLATELET
HCT: 38.5 % (ref 36.0–46.0)
Hemoglobin: 11.8 g/dL — ABNORMAL LOW (ref 12.0–15.0)
MCH: 28.1 pg (ref 26.0–34.0)
MCHC: 30.6 g/dL (ref 30.0–36.0)
MCV: 91.7 fL (ref 78.0–100.0)
PLATELETS: 228 10*3/uL (ref 150–400)
RBC: 4.2 MIL/uL (ref 3.87–5.11)
RDW: 14.3 % (ref 11.5–15.5)
WBC: 5.4 10*3/uL (ref 4.0–10.5)

## 2015-04-22 LAB — I-STAT ARTERIAL BLOOD GAS, ED
Acid-base deficit: 1 mmol/L (ref 0.0–2.0)
Bicarbonate: 25.2 mEq/L — ABNORMAL HIGH (ref 20.0–24.0)
O2 Saturation: 100 %
PCO2 ART: 46.2 mmHg — AB (ref 35.0–45.0)
Patient temperature: 98.6
TCO2: 27 mmol/L (ref 0–100)
pH, Arterial: 7.344 — ABNORMAL LOW (ref 7.350–7.450)
pO2, Arterial: 224 mmHg — ABNORMAL HIGH (ref 80.0–100.0)

## 2015-04-22 LAB — INFLUENZA PANEL BY PCR (TYPE A & B)
H1N1FLUPCR: DETECTED — AB
INFLAPCR: POSITIVE — AB
INFLBPCR: NEGATIVE

## 2015-04-22 LAB — MRSA PCR SCREENING: MRSA by PCR: NEGATIVE

## 2015-04-22 LAB — PROTIME-INR
INR: 1.01 (ref 0.00–1.49)
PROTHROMBIN TIME: 13.5 s (ref 11.6–15.2)

## 2015-04-22 LAB — TROPONIN I: Troponin I: <0.03 ng/mL (ref <0.031)

## 2015-04-22 MED ORDER — ALBUTEROL SULFATE (2.5 MG/3ML) 0.083% IN NEBU
2.5000 mg | INHALATION_SOLUTION | RESPIRATORY_TRACT | Status: DC | PRN
  Filled 2015-04-22: qty 3

## 2015-04-22 MED ORDER — GABAPENTIN 300 MG PO CAPS
300.0000 mg | ORAL_CAPSULE | Freq: Every day | ORAL | Status: DC
  Administered 2015-04-22 – 2015-04-27 (×6): 300 mg via ORAL
  Filled 2015-04-22 (×6): qty 1

## 2015-04-22 MED ORDER — DM-GUAIFENESIN ER 30-600 MG PO TB12
1.0000 | ORAL_TABLET | Freq: Two times a day (BID) | ORAL | Status: DC
  Administered 2015-04-22 – 2015-04-28 (×12): 1 via ORAL
  Filled 2015-04-22 (×12): qty 1

## 2015-04-22 MED ORDER — ALBUTEROL (5 MG/ML) CONTINUOUS INHALATION SOLN
10.0000 mg/h | INHALATION_SOLUTION | Freq: Once | RESPIRATORY_TRACT | Status: AC
  Administered 2015-04-22: 10 mg/h via RESPIRATORY_TRACT
  Filled 2015-04-22: qty 20

## 2015-04-22 MED ORDER — ALBUTEROL SULFATE (2.5 MG/3ML) 0.083% IN NEBU
5.0000 mg | INHALATION_SOLUTION | Freq: Once | RESPIRATORY_TRACT | Status: AC
  Administered 2015-04-22: 5 mg via RESPIRATORY_TRACT

## 2015-04-22 MED ORDER — AZITHROMYCIN 500 MG PO TABS
500.0000 mg | ORAL_TABLET | Freq: Every day | ORAL | Status: AC
  Administered 2015-04-22: 500 mg via ORAL
  Filled 2015-04-22: qty 1

## 2015-04-22 MED ORDER — METHYLPREDNISOLONE SODIUM SUCC 125 MG IJ SOLR
125.0000 mg | Freq: Once | INTRAMUSCULAR | Status: AC
  Administered 2015-04-22: 125 mg via INTRAVENOUS

## 2015-04-22 MED ORDER — HEPARIN SODIUM (PORCINE) 5000 UNIT/ML IJ SOLN
5000.0000 [IU] | Freq: Three times a day (TID) | INTRAMUSCULAR | Status: DC
  Administered 2015-04-22 – 2015-04-27 (×14): 5000 [IU] via SUBCUTANEOUS
  Filled 2015-04-22 (×14): qty 1

## 2015-04-22 MED ORDER — METOPROLOL TARTRATE 1 MG/ML IV SOLN: 5.0000 mg | Freq: Once | INTRAVENOUS | Status: DC

## 2015-04-22 MED ORDER — BENAZEPRIL HCL 20 MG PO TABS
20.0000 mg | ORAL_TABLET | Freq: Every day | ORAL | Status: DC
  Administered 2015-04-23 – 2015-04-28 (×6): 20 mg via ORAL
  Filled 2015-04-22 (×7): qty 1

## 2015-04-22 MED ORDER — ATORVASTATIN CALCIUM 40 MG PO TABS
40.0000 mg | ORAL_TABLET | Freq: Every day | ORAL | Status: DC
  Administered 2015-04-22 – 2015-04-27 (×6): 40 mg via ORAL
  Filled 2015-04-22 (×6): qty 1

## 2015-04-22 MED ORDER — LEVOTHYROXINE SODIUM 75 MCG PO TABS
75.0000 ug | ORAL_TABLET | Freq: Every day | ORAL | Status: DC
  Administered 2015-04-23 – 2015-04-28 (×6): 75 ug via ORAL
  Filled 2015-04-22 (×6): qty 1

## 2015-04-22 MED ORDER — METHYLPREDNISOLONE SODIUM SUCC 125 MG IJ SOLR
60.0000 mg | Freq: Three times a day (TID) | INTRAMUSCULAR | Status: DC
  Administered 2015-04-22 – 2015-04-23 (×2): 60 mg via INTRAVENOUS
  Filled 2015-04-22 (×2): qty 2

## 2015-04-22 MED ORDER — METOPROLOL TARTRATE 1 MG/ML IV SOLN
5.0000 mg | INTRAVENOUS | Status: DC | PRN
  Administered 2015-04-22 – 2015-04-25 (×2): 5 mg via INTRAVENOUS
  Filled 2015-04-22 (×2): qty 5

## 2015-04-22 MED ORDER — HYDROCODONE-ACETAMINOPHEN 10-325 MG PO TABS
1.0000 | ORAL_TABLET | Freq: Three times a day (TID) | ORAL | Status: DC
  Administered 2015-04-22 – 2015-04-28 (×17): 1 via ORAL
  Filled 2015-04-22 (×17): qty 1

## 2015-04-22 MED ORDER — IPRATROPIUM BROMIDE 0.02 % IN SOLN
0.5000 mg | Freq: Once | RESPIRATORY_TRACT | Status: AC
  Administered 2015-04-22: 0.5 mg via RESPIRATORY_TRACT

## 2015-04-22 MED ORDER — ASPIRIN EC 325 MG PO TBEC
325.0000 mg | DELAYED_RELEASE_TABLET | Freq: Every day | ORAL | Status: DC
  Administered 2015-04-23 – 2015-04-28 (×6): 325 mg via ORAL
  Filled 2015-04-22 (×6): qty 1

## 2015-04-22 MED ORDER — METOPROLOL TARTRATE 50 MG PO TABS
50.0000 mg | ORAL_TABLET | Freq: Two times a day (BID) | ORAL | Status: DC
  Administered 2015-04-22 – 2015-04-28 (×12): 50 mg via ORAL
  Filled 2015-04-22 (×12): qty 1

## 2015-04-22 MED ORDER — AZITHROMYCIN 500 MG PO TABS
250.0000 mg | ORAL_TABLET | Freq: Every day | ORAL | Status: DC
  Administered 2015-04-23: 250 mg via ORAL
  Filled 2015-04-22: qty 1

## 2015-04-22 MED ORDER — LORAZEPAM 2 MG/ML IJ SOLN
0.5000 mg | Freq: Once | INTRAMUSCULAR | Status: AC
  Administered 2015-04-22: 0.5 mg via INTRAVENOUS
  Filled 2015-04-22: qty 1

## 2015-04-22 MED ORDER — LEVALBUTEROL HCL 1.25 MG/0.5ML IN NEBU: 1.2500 mg | INHALATION_SOLUTION | Freq: Four times a day (QID) | RESPIRATORY_TRACT | Status: DC

## 2015-04-22 MED ORDER — IPRATROPIUM BROMIDE 0.02 % IN SOLN
0.5000 mg | RESPIRATORY_TRACT | Status: DC
  Administered 2015-04-22 – 2015-04-23 (×2): 0.5 mg via RESPIRATORY_TRACT
  Filled 2015-04-22 (×2): qty 2.5

## 2015-04-22 MED ORDER — HYDROCHLOROTHIAZIDE 12.5 MG PO CAPS
12.5000 mg | ORAL_CAPSULE | Freq: Every day | ORAL | Status: DC | PRN
  Filled 2015-04-22: qty 1

## 2015-04-22 NOTE — H&P (Signed)
[facility] History and Physical  TONA SKUPIEN [id number] DOB: Dec 14, 1950 DOA: 04/22/2015  Referring physician: ED physician PCP: Binnie Rail, MD  Specialists:   Chief Complaint: Cough, shortness of breath and wheezing  HPI: Hannah Scott is a 64 y.o. female with PMH of asthma, hypertension, hyperlipidemia, GERD, hypothyroidism, depression, CAD, atrioseptal aneurysm, breast cancer 2032 (S/P of lumpectomy, radiation therapy and tamoxifen treatment) cyst, endometriosis, scoliosis, who presents with cough, short of breath and wheezing.  Patient reports that she has been having cough, shortness of breath, chest congestion, chest tightness in the past 4 days. She was seen in urgent care today, and received breathing treatment, but without significant improvement. Currently she has productive cough with greenish colored sputum production. Patient has chest tightness, but no chest pain. Patient reports have chronic back pain. No abdominal pain, diarrhea, symptoms of UTI, unilateral weakness.  In ED, patient was found to have WBC 5.4, temperature 98.7, tachycardia, tachypnea, potassium 3.4, renal functioning okay. Chest x-ray has no infiltration, but showed stable elevation of her right hemidiaphragm. ABG showed pH 7.344, PCO2 46.2, PO2 224. Patient's admitted to inpatient for further evaluation and treatment.  Where does patient live?   At home   Can patient participate in ADLs?  Some   Review of Systems:   General: no fevers, chills, no changes in body weight, has fatigue HEENT: no blurry vision, hearing changes or sore throat Pulm: has dyspnea, coughing, wheezing CV: no chest pain, palpitations Abd: no nausea, vomiting, abdominal pain, diarrhea, constipation GU: no dysuria, burning on urination, increased urinary frequency, hematuria  Ext: no leg edema Neuro: no unilateral weakness, numbness, or tingling, no vision change or hearing loss Skin: no rash MSK: has back pain Heme: No  easy bruising.  Travel history: No recent long distant travel.  Allergy:  Allergies  Allergen Reactions  . Fluticasone-Salmeterol Other (See Comments)    REACTION: headache.  She is tolerating Dulera well.  [name] Norvasc [Amlodipine Besylate] Swelling and Other (See Comments)    edema  . Tape Other (See Comments)    Prefers paper tape    Past Medical History  Diagnosis Date  . Asthma     PFTs, February, 2011, moderate obstructive disease with response to bronchodilators, normal lung volumes, moderate reduction in diffusing capacity  . Obstructive airway disease ([place])   . Hyperlipidemia   . Hypertension   . Hypothyroidism     Patient has had in the past that she does not need treatment  . CAD (coronary artery disease)     90% distal LAD in the past  /   nuclear, 2008, no ischemia, ejection fraction 70%  . Atrial septal aneurysm     Echo, 2008-not noted on 13 echo  . Pinched nerve     lower back  . Kyphoscoliosis   . Shortness of breath   . Endometriosis 1989    RIGHT TUBE  . Endometriosis 1987    LEFT TUBE/OVARY W FOCAL IN-SITU ENDOMETRIAL ADENOCARCINOMA  . Cancer ([place]) 2002    DUCTAL CIS--S/P LUMPECTOMY, RADIATION AND 6 WEEKS OF TAMOXIFEN  . Ejection fraction     EF 60%, echo, October, 2008  . Elevated CPK     January, 2014  . D-dimer, elevated     January, 2014  . Depression   . UTI (lower urinary tract infection)   . GERD (gastroesophageal reflux disease)     occ  . H/O hiatal hernia     ?  [name] Arthritis   .  Anemia     hx    Past Surgical History  Procedure Laterality Date  . Cataract extraction Bilateral 01,03  . Cardiovascular stress test  09/07/05    Nuclear, was negative  . Breast lumpectomy  2003    radiation on right  . Appendectomy  1987    AT [facility]  . Total abdominal hysterectomy  1987    LSO, APPENDECTOMY  . Pelvic laparoscopy  1989    RSO, LYSIS OF ADHESIONS  . Oophorectomy      LSO -RSO  . Back surgery      Fusion  . Ankle surgery Left      ligament  . Total hip arthroplasty Right 10/28/2013    dr Lorin Mercy  . Total hip arthroplasty Right 10/28/2013    Procedure: TOTAL HIP ARTHROPLASTY ANTERIOR APPROACH;  Surgeon: Marybelle Killings, MD;  Location: [place];  Service: Orthopedics;  Laterality: Right;  Right Total Hip Arthroplasty, Direct Anterior Approach    Social History:  reports that she has never smoked. She has never used smokeless tobacco. She reports that she does not drink alcohol or use illicit drugs.  Family History:  Family History  Problem Relation Age of Onset  . Heart attack Father   . Heart disease Father   . Heart failure Mother   . Pneumonia Mother   . Hypertension Mother   . Heart disease Mother   . Stroke Maternal Grandfather   . Hypertension Maternal Grandfather   . Asthma Paternal Uncle     PAT UNCLES  . Heart attack Paternal Uncle      Prior to Admission medications   Medication Sig Start Date End Date Taking? Authorizing Provider  albuterol (PROAIR HFA) 108 (90 BASE) MCG/ACT inhaler Inhale 1-2 puffs into the lungs every 6 (six) hours as needed. 10/20/14  Yes Hendricks Limes, MD  aspirin EC 325 MG tablet Take 1 tablet (325 mg total) by mouth daily. 10/28/13  Yes Phillips Hay, PA-C  atorvastatin (LIPITOR) 40 MG tablet TAKE ONE TABLET BY MOUTH AT BEDTIME 06/05/14  Yes Carlena Bjornstad, MD  benazepril (LOTENSIN) 40 MG tablet Take 1/2 tablet by mouth daily Patient taking differently: Take 20 mg by mouth daily. Take 1/2 tablet by mouth daily 08/08/14  Yes Hendricks Limes, MD  Fluticasone Furoate-Vilanterol 100-25 MCG/INH AEPB Inhale 1 puff into the lungs daily. 11/14/14  Yes Hendricks Limes, MD  gabapentin (NEURONTIN) 300 MG capsule Take 1 capsule (300 mg total) by mouth at bedtime. 02/10/15  Yes Bayard Hugger, NP  hydrochlorothiazide (MICROZIDE) 12.5 MG capsule Take 1 capsule (12.5 mg total) by mouth daily. Patient taking differently: Take 12.5 mg by mouth daily as needed (for fluid).  10/20/14  Yes Hendricks Limes,  MD  HYDROcodone-acetaminophen [facility]) 10-325 MG tablet Take 1 tablet by mouth every 6 (six) hours as needed. Patient taking differently: Take 1 tablet by mouth 3 (three) times daily.  04/13/15  Yes Bayard Hugger, NP  ipratropium-albuterol (DUONEB) 0.5-2.5 (3) MG/3ML SOLN Take 3 mLs by nebulization every 4 (four) hours as needed. Must not be qid unless during exacerbation. Dx 493.90 10/20/14  Yes Hendricks Limes, MD  levothyroxine (SYNTHROID, LEVOTHROID) 75 MCG tablet Take 1 tablet (75 mcg total) by mouth daily before breakfast. 02/12/15  Yes Renato Shin, MD  metoprolol (LOPRESSOR) 50 MG tablet 1 q 12 hrs Patient taking differently: Take 50 mg by mouth 2 (two) times daily. 1 q 12 hrs 01/28/15  Yes Hendricks Limes,  MD    Physical Exam: Filed Vitals:   04/22/15 1930 04/22/15 1945 04/22/15 2015 04/22/15 2030  BP: 160/87 161/84 139/74 147/90  Pulse: 151 148 141 143  Temp:      TempSrc:      Resp: 26     SpO2: 94% 93% 93% 93%   General: Not in acute distress HEENT:       Eyes: PERRL, EOMI, no scleral icterus.       ENT: No discharge from the ears and nose, no pharynx injection, no tonsillar enlargement.        Neck: No JVD, no bruit, no mass felt. Heme: No neck lymph node enlargement. Cardiac: S1/S2, RRR, tachycardia, No murmurs, No gallops or rubs. Pulm: decreased air movement bilaterally. Has descending bilaterally. No rales or rubs. Abd: Soft, nondistended, nontender, no rebound pain, no organomegaly, BS present. Ext: No pitting leg edema bilaterally. 2+DP/PT pulse bilaterally. Musculoskeletal: No joint deformities, No joint redness or warmth, no limitation of ROM in spin. Skin: No rashes.  Neuro: Alert, oriented X3, cranial nerves II-XII grossly intact, muscle strength 5/5 in all extremities, sensation to light touch intact.  Psych: Patient is not psychotic, no suicidal or hemocidal ideation.  Labs on Admission:  Basic Metabolic Panel:  Recent Labs Lab 04/22/15 1804  NA 141   K 3.4*  CL 105  CO2 21*  GLUCOSE 273*  BUN 9  CREATININE 0.85  CALCIUM 9.4   Liver Function Tests: No results for input(s): AST, ALT, ALKPHOS, BILITOT, PROT, ALBUMIN in the last 168 hours. No results for input(s): LIPASE, AMYLASE in the last 168 hours. No results for input(s): AMMONIA in the last 168 hours. CBC:  Recent Labs Lab 04/22/15 1804  WBC 5.4  HGB 11.8*  HCT 38.5  MCV 91.7  PLT 228   Cardiac Enzymes:  Recent Labs Lab 04/22/15 1804  TROPONINI <0.03    BNP (last 3 results) No results for input(s): BNP in the last 8760 hours.  ProBNP (last 3 results) No results for input(s): PROBNP in the last 8760 hours.  CBG: No results for input(s): GLUCAP in the last 168 hours.  Radiological Exams on Admission: Dg Chest Port 1 View  04/22/2015  CLINICAL DATA:  Shortness of Breath.  History of asthma and COPD. EXAM: PORTABLE CHEST 1 VIEW COMPARISON:  10/23/2013 FINDINGS: Elevation of the right hemidiaphragm is stable. Mild cardiomegaly. No confluent airspace opacities or effusions. No acute bony abnormality. Moderate-sized hiatal hernia, stable. IMPRESSION: Stable elevation of the right hemidiaphragm. Hiatal hernia. No acute findings. Electronically Signed   By: Rolm Baptise M.D.   On: 04/22/2015 18:20    EKG: Independently reviewed. QTC 580, poor R-wave progression, sinus tachycardia   Assessment/Plan Principal Problem:   Asthma exacerbation Active Problems:   Depression   Endometriosis   Scoliosis   Hypothyroidism   CAD (coronary artery disease)   Atrial septal aneurysm   Hypertension   Shortness of breath   Acute respiratory failure with hypoxia (HCC)   Status asthmaticus   Back pain  Acute respiratory failure with hypoxia ([facility]CC) due to asthma exacerbation: Patient's cough, shortness of breath and wheezing are consistent with asthma exacerbation. No pneumonia on chest x-ray. No history of congestive heart failure. No chest pain, less likely to have PE. ABG  showed pH 7.344, PCO2 46.2, PO2 224.   -will admit patient to SDU -Prn BiPAP -Nebulizers: scheduled Atrovent q3h and prn Xopenex -Solu-Medrol 60 mg IV tid -Oral azithromycin for 5 days.  -Mucinex for  cough  -Urine S. pneumococcal antigen -Follow up blood culture x2, sputum culture, Flu pcr  Sinus tachycardia: Likely due to hypoxia, which has been possibly worsened by albuterol nebs -prn Metoprolol IV for HR>130  Hypothyroidism: Last TSH was 9.8-6 on 02/12/15. Her Synthroid dose was increased from 50-75 g daily -Continue home Synthroid  Depression: Stable, no suicidal or homicidal ideations. Not all medications sent home.  -Observe  CAD (coronary artery disease): No chest pain - On ASA, metoprolol and Lipitor  HLD: Last LDL was 55 on 02/12/15 -Continue home medications: Lipitor  HTN:  -Lotensin, HCTZ, metoprolol  Back pain: -When necessary Norco  DVT ppx: SQ Heparin        Code Status: partial code (OK with CPR, but not intubation) Family Communication:  Yes, patient's  son     at bed side Disposition Plan: Admit to inpatient   Date of Service 04/22/2015    Ivor Costa [facility] Pager [phone]  If 7PM-7AM, please contact night-coverage [email or web address] Password [facility] 04/22/2015, 8:56 PM

## 2015-04-22 NOTE — ED Notes (Signed)
HR 140s-MD Jeneen Rinks aware.  Pt on continuous neb.

## 2015-04-22 NOTE — ED Provider Notes (Addendum)
CSN: [id number]     Arrival date & time 04/22/15  1416 History   First MD Initiated Contact with Patient 04/22/15 1715     Chief Complaint  Patient presents with  . Asthma      HPI  Patient presents for evaluation of difficulty breathing. Has a history of asthma. Uses home nebulizers. She states on Sunday, 4 days ago she started feeling like things were worse. Has been using her nebulized albuterol nearly 3 4 hours since that time. Feeling little improvement. She went to urgent care today. She states she received "5 or 7"  treatments with minimal relief.  Denies pain. No fever. Cough is productive of some yellow-green sputum. No hemoptysis.  Stay she has had asthma "my whole life". She is a lifetime nonsmoker. Had a previous heart attack. No history of congestive heart failure. Does not have orthopnea. No extremity swelling.  She has been admitted to hospital. She has never been on a ventilator. She states that she would not want to be. Is adamant that she would not want to be intubated should she develop worsening respiratory failure.  Does NOT have a written DO NOT RESUSCITATE.  Pt received IV Sol[facility]-Medrol 125mg IV at [facility] prior to transfer.  Past Medical History  Diagnosis Date  . Asthma     PFTs, February, 2011, moderate obstructive disease with response to bronchodilators, normal lung volumes, moderate reduction in diffusing capacity  . Obstructive airway disease (HCC)   . Hyperlipidemia   . Hypertension   . Hypothyroidism     Patient has had in the past that she does not need treatment  . CAD (coronary artery disease)     90 % distal LAD in the past  /   nuclear, 2008, no ischemia, ejection fraction 70%  . Atrial septal aneurysm     Echo, 2008-not noted on 13 echo  . Pinched nerve     lower back  . Kyphoscoliosis   . Shortness of breath   . Endometriosis 1989    RIGHT TUBE  . Endometriosis 1987    LEFT TUBE/OVARY W FOCAL IN-SITU ENDOMETRIAL ADENOCARCINOMA  . Cancer  ([place]) 2002    DUCTAL CIS--S/P LUMPECTOMY, RADIATION AND 6 WEEKS OF TAMOXIFEN  . Ejection fraction     EF 60%, echo, October, 2008  . Elevated CPK     January, 2014  . D-dimer, elevated     January, 2014  . Depression   . UTI (lower urinary tract infection)   . GERD (gastroesophageal reflux disease)     occ  . H/O hiatal hernia     ?  [name] Arthritis   . Anemia     hx   Past Surgical History  Procedure Laterality Date  . Cataract extraction Bilateral 01,03  . Cardiovascular stress test  09/07/05    Nuclear, was negative  . Breast lumpectomy  2003    radiation on right  . Appendectomy  1987    AT [facility]  . Total abdominal hysterectomy  1987    LSO, APPENDECTOMY  . Pelvic laparoscopy  1989    RSO, LYSIS OF ADHESIONS  . Oophorectomy      LSO -RSO  . Back surgery      Fusion  . Ankle surgery Left     ligament  . Total hip arthroplasty Right 10/28/2013    dr Lorin Mercy  . Total hip arthroplasty Right 10/28/2013    Procedure: TOTAL HIP ARTHROPLASTY ANTERIOR APPROACH;  Surgeon: Marybelle Killings, MD;  Location: [facility];  Service: Orthopedics;  Laterality: Right;  Right Total Hip Arthroplasty, Direct Anterior Approach   Family History  Problem Relation Age of Onset  . Heart attack Father   . Heart disease Father   . Heart failure Mother   . Pneumonia Mother   . Hypertension Mother   . Heart disease Mother   . Stroke Maternal Grandfather   . Hypertension Maternal Grandfather   . Asthma Paternal Uncle     PAT UNCLES  . Heart attack Paternal Uncle    Social History  Substance Use Topics  . Smoking status: Never Smoker   . Smokeless tobacco: Never Used  . Alcohol Use: No   OB History    Gravida Para Term Preterm AB TAB SAB Ectopic Multiple Living   2 2 2       2      Review of Systems  Constitutional: Negative for fever, chills, diaphoresis, appetite change and fatigue.  HENT: Negative for mouth sores, sore throat and trouble swallowing.   Eyes: Negative for visual disturbance.   Respiratory: Positive for cough, shortness of breath and wheezing. Negative for chest tightness.   Cardiovascular: Negative for chest pain.  Gastrointestinal: Negative for nausea, vomiting, abdominal pain, diarrhea and abdominal distention.  Endocrine: Negative for polydipsia, polyphagia and polyuria.  Genitourinary: Negative for dysuria, frequency and hematuria.  Musculoskeletal: Negative for gait problem.  Skin: Negative for color change, pallor and rash.  Neurological: Negative for dizziness, syncope, light-headedness and headaches.  Hematological: Does not bruise/bleed easily.  Psychiatric/Behavioral: Negative for behavioral problems and confusion.      Allergies  Fluticasone-salmeterol; Norvasc; and Tape  Home Medications   Prior to Admission medications   Medication Sig Start Date End Date Taking? Authorizing Provider  albuterol (PROAIR HFA) 108 (90 BASE) MCG/ACT inhaler Inhale 1-2 puffs into the lungs every 6 (six) hours as needed. 10/20/14  Yes Hendricks Limes, MD  aspirin EC 325 MG tablet Take 1 tablet (325 mg total) by mouth daily. 10/28/13  Yes Phillips Hay, PA-C  atorvastatin (LIPITOR) 40 MG tablet TAKE ONE TABLET BY MOUTH AT BEDTIME 06/05/14  Yes Carlena Bjornstad, MD  benazepril (LOTENSIN) 40 MG tablet Take 1/2 tablet by mouth daily Patient taking differently: Take 20 mg by mouth daily. Take 1/2 tablet by mouth daily 08/08/14  Yes Hendricks Limes, MD  Fluticasone Furoate-Vilanterol 100-25 MCG/INH AEPB Inhale 1 puff into the lungs daily. 11/14/14  Yes Hendricks Limes, MD  gabapentin (NEURONTIN) 300 MG capsule Take 1 capsule (300 mg total) by mouth at bedtime. 02/10/15  Yes Bayard Hugger, NP  hydrochlorothiazide (MICROZIDE) 12.5 MG capsule Take 1 capsule (12.5 mg total) by mouth daily. Patient taking differently: Take 12.5 mg by mouth daily as needed (for fluid).  10/20/14  Yes Hendricks Limes, MD  HYDROcodone-acetaminophen [facility]) 10-325 MG tablet Take 1 tablet by mouth  every 6 (six) hours as needed. Patient taking differently: Take 1 tablet by mouth 3 (three) times daily.  04/13/15  Yes Bayard Hugger, NP  ipratropium-albuterol (DUONEB) 0.5-2.5 (3) MG/3ML SOLN Take 3 mLs by nebulization every 4 (four) hours as needed. Must not be qid unless during exacerbation. Dx 493.90 10/20/14  Yes Hendricks Limes, MD  levothyroxine (SYNTHROID, LEVOTHROID) 75 MCG tablet Take 1 tablet (75 mcg total) by mouth daily before breakfast. 02/12/15  Yes Renato Shin, MD  metoprolol (LOPRESSOR) 50 MG tablet 1 q 12 hrs Patient taking differently: Take 50 mg by mouth 2 (two) times  daily. 1 q 12 hrs 01/28/15  Yes Hendricks Limes, MD   BP 151/77 mmHg  Pulse 150  Temp(Src) 98.7 F (37.1 C) (Oral)  Resp 21  SpO2 96% Physical Exam  Constitutional: She is oriented to person, place, and time. She appears well-developed and well-nourished. No distress.  HENT:  Head: Normocephalic.  Eyes: Conjunctivae are normal. Pupils are equal, round, and reactive to light. No scleral icterus.  Neck: Normal range of motion. Neck supple. No thyromegaly present.  Cardiovascular: Regular rhythm.  Tachycardia present.  Exam reveals no gallop and no friction rub.   No murmur heard. Pulmonary/Chest: Accessory muscle usage present. Tachypnea noted. No respiratory distress. She has decreased breath sounds in the right upper field, the right middle field, the right lower field, the left upper field, the left middle field and the left lower field. She has wheezes in the right upper field, the right middle field, the right lower field, the left upper field, the left middle field and the left lower field. She has no rales.  Speaking 3-4 words at a time.  Abdominal: Soft. Bowel sounds are normal. She exhibits no distension. There is no tenderness. There is no rebound.  Musculoskeletal: Normal range of motion.  Neurological: She is alert and oriented to person, place, and time.  Skin: Skin is warm and dry. No rash  noted.  Psychiatric: She has a normal mood and affect. Her behavior is normal.    ED Course  Procedures (including critical care time) Labs Review Labs Reviewed  CBC WITH DIFFERENTIAL/PLATELET - Abnormal; Notable for the following:    Hemoglobin 11.8 (*)    All other components within normal limits  I-STAT ARTERIAL BLOOD GAS, ED - Abnormal; Notable for the following:    pH, Arterial 7.344 (*)    pCO2 arterial 46.2 (*)    pO2, Arterial 224.0 (*)    Bicarbonate 25.2 (*)    All other components within normal limits  TROPONIN I  BASIC METABOLIC PANEL  BLOOD GAS, ARTERIAL    Imaging Review Dg Chest Port 1 View  04/22/2015  CLINICAL DATA:  Shortness of Breath.  History of asthma and COPD. EXAM: PORTABLE CHEST 1 VIEW COMPARISON:  10/23/2013 FINDINGS: Elevation of the right hemidiaphragm is stable. Mild cardiomegaly. No confluent airspace opacities or effusions. No acute bony abnormality. Moderate-sized hiatal hernia, stable. IMPRESSION: Stable elevation of the right hemidiaphragm. Hiatal hernia. No acute findings. Electronically Signed   By: Rolm Baptise M.D.   On: 04/22/2015 18:20   I have personally reviewed and evaluated these images and lab results as part of my medical decision-making.   EKG Interpretation   Date/Time:  Wednesday April 22 2015 17:49:06 EST Ventricular Rate:  117 PR Interval:  126 QRS Duration: 84 QT Interval:  416 QTC Calculation: 580 R Axis:   46 Text Interpretation:  Sinus tachycardia Prolonged QT interval Confirmed by  Jeneen Rinks  MD, [place] ([id number]) on 04/22/2015 6:08:33 PM      MDM   Final diagnoses:  Status asthmaticus, moderate persistent  Acute respiratory failure with hypercapnia ([place])     CRITICAL CARE Performed by: Tanna Furry JOSEPH   Total critical care time: 60 minutes into new his nebulized albuterol with continuous heart monitoring. Reevaluation 3 during this time period.   Critical care time was exclusive of separately billable  procedures and treating other patients.  Critical care was necessary to treat or prevent imminent or life-threatening deterioration.  Critical care was time spent personally by me on  the following activities: development of treatment plan with patient and/or surrogate as well as nursing, discussions with consultants, evaluation of patient's response to treatment, examination of patient, obtaining history from patient or surrogate, ordering and performing treatments and interventions, ordering and review of laboratory studies, ordering and review of radiographic studies, pulse oximetry and re-evaluation of patient's condition.  Given nebulized albuterol over an hour. Reexam 3 shows minimal improvement in aeration. However, she states she has some relief. Does not feel fatigued. ABGs shows respiratory failure with pH 7.34, PCO2 46, PO2 224.  X-ray shows no fluid or infiltrates.  Is mentating well. Is oxygenating well. Plan is admission. I have placed a call to the hospitalist to discuss disposition.    Tanna Furry, MD 04/22/15 1918  Tanna Furry, MD 04/22/15 [phone]

## 2015-04-22 NOTE — Progress Notes (Signed)
Subjective:  Patient ID: Hannah Scott, female    DOB: 1950-07-28  Age: 64 y.o. MRN: [id number]  CC: Asthma   HPI Hannah Scott presents  she has a history of asthma and COPD. She has been ill for the last 3 or 4 days. She says that she has not slept for the last 2 nights due to shortness of breath and wheezing. She has been using her nebulizer at home with little improvement in her shortness of breath and wheezing. She has a nonproductive cough no fever or chills. She has no chest pain tightness heaviness or pressure. She has no nausea vomiting or stool change.  History Hannah Scott has a past medical history of Asthma; Obstructive airway disease ([place]); Hyperlipidemia; Hypertension; Hypothyroidism; CAD (coronary artery disease); Atrial septal aneurysm; Pinched nerve; Kyphoscoliosis; Shortness of breath; Endometriosis (1989); Endometriosis (1987); Cancer ([place]) (2002); Ejection fraction; Elevated CPK; D-dimer, elevated; Depression; UTI (lower urinary tract infection); GERD (gastroesophageal reflux disease); H/O hiatal hernia; Arthritis; and Anemia.   She has past surgical history that includes Cataract extraction (Bilateral, 01,03); Cardiovascular stress test (09/07/05); Breast lumpectomy (2003); Appendectomy (1987); Total abdominal hysterectomy (1987); Pelvic laparoscopy (1989); Oophorectomy; Back surgery; Ankle surgery (Left); Total hip arthroplasty (Right, 10/28/2013); and Total hip arthroplasty (Right, 10/28/2013).   Her  family history includes Asthma in her paternal uncle; Heart attack in her father and paternal uncle; Heart disease in her father and mother; Heart failure in her mother; Hypertension in her maternal grandfather and mother; Pneumonia in her mother; Stroke in her maternal grandfather.  She   reports that she has never smoked. She has never used smokeless tobacco. She reports that she does not drink alcohol or use illicit drugs.  Outpatient Prescriptions Prior to Visit  Medication Sig  Dispense Refill  . albuterol (PROAIR HFA) 108 (90 BASE) MCG/ACT inhaler Inhale 1-2 puffs into the lungs every 6 (six) hours as needed. 1 Inhaler 2  . aspirin EC 325 MG tablet Take 1 tablet (325 mg total) by mouth daily. 30 tablet 0  . atorvastatin (LIPITOR) 40 MG tablet TAKE ONE TABLET BY MOUTH AT BEDTIME 90 tablet 3  . benazepril (LOTENSIN) 40 MG tablet Take 1/2 tablet by mouth daily 30 tablet 5  . Fluticasone Furoate-Vilanterol 100-25 MCG/INH AEPB Inhale 1 puff into the lungs daily. 60 each 5  . gabapentin (NEURONTIN) 300 MG capsule Take 1 capsule (300 mg total) by mouth at bedtime. 30 capsule 5  . hydrochlorothiazide (MICROZIDE) 12.5 MG capsule Take 1 capsule (12.5 mg total) by mouth daily. 30 capsule 2  . HYDROcodone-acetaminophen (NORCO) 10-325 MG tablet Take 1 tablet by mouth every 6 (six) hours as needed. 100 tablet 0  . ipratropium-albuterol (DUONEB) 0.5-2.5 (3) MG/3ML SOLN Take 3 mLs by nebulization every 4 (four) hours as needed. Must not be qid unless during exacerbation. Dx 493.90 360 mL 1  . levothyroxine (SYNTHROID, LEVOTHROID) 75 MCG tablet Take 1 tablet (75 mcg total) by mouth daily before breakfast. 30 tablet 11  . metoprolol (LOPRESSOR) 50 MG tablet 1 q 12 hrs 60 tablet 5   No facility-administered medications prior to visit.    Social History   Social History  . Marital Status: Married    Spouse Name: N/A  . Number of Children: N/A  . Years of Education: N/A   Social History Main Topics  . Smoking status: Never Smoker   . Smokeless tobacco: Never Used  . Alcohol Use: No  . Drug Use: No  . Sexual Activity:  No   Other Topics Concern  . None   Social History Narrative     Review of Systems  Constitutional: Negative for fever, chills and appetite change.  HENT: Negative for congestion, ear pain, postnasal drip, sinus pressure and sore throat.   Eyes: Negative for pain and redness.  Respiratory: Positive for chest tightness, shortness of breath and wheezing.  Negative for cough.   Cardiovascular: Negative for leg swelling.  Gastrointestinal: Negative for nausea, vomiting, abdominal pain, diarrhea, constipation and blood in stool.  Endocrine: Negative for polyuria.  Genitourinary: Negative for dysuria, urgency, frequency and flank pain.  Musculoskeletal: Negative for gait problem.  Skin: Negative for rash.  Neurological: Negative for weakness and headaches.  Psychiatric/Behavioral: Negative for confusion and decreased concentration. The patient is not nervous/anxious.     Objective:  BP 132/84 mmHg  Pulse 110  Temp(Src) 98.7 F (37.1 C)  Resp 30  SpO2 90%  Physical Exam  Constitutional: She is oriented to person, place, and time. She appears well-developed and well-nourished. No distress.  HENT:  Head: Normocephalic and atraumatic.  Right Ear: External ear normal.  Left Ear: External ear normal.  Nose: Nose normal.  Eyes: Conjunctivae and EOM are normal. Pupils are equal, round, and reactive to light. No scleral icterus.  Neck: Normal range of motion. Neck supple. No tracheal deviation present.  Cardiovascular: Normal rate, regular rhythm and normal heart sounds.   Pulmonary/Chest: Accessory muscle usage present. She is in respiratory distress. She has decreased breath sounds. She has wheezes. She has no rhonchi. She has no rales.  Her respiratory effort is increased. She is able to speak in complete sentences however. She has generalized wheezes and poor air movement. She's not cyanotic  Abdominal: She exhibits no mass. There is no tenderness. There is no rebound and no guarding.  Musculoskeletal: She exhibits no edema.  Lymphadenopathy:    She has no cervical adenopathy.  Neurological: She is alert and oriented to person, place, and time. Coordination normal.  Skin: Skin is warm and dry. No rash noted.  Psychiatric: She has a normal mood and affect. Her behavior is normal.      Assessment & Plan:   Hannah Scott was seen today for  asthma.  Diagnoses and all orders for this visit:  Status asthmaticus, moderate persistent -     albuterol (PROVENTIL) (2.5 MG/3ML) 0.083% nebulizer solution 5 mg; Take 6 mLs (5 mg total) by nebulization once. -     ipratropium (ATROVENT) nebulizer solution 0.5 mg; Take 2.5 mLs (0.5 mg total) by nebulization once. -     methylPREDNISolone sodium succinate (SOLU-MEDROL) 125 mg/2 mL injection 125 mg; Inject 2 mLs (125 mg total) into the vein once.   I am having Ms. Clauson maintain her aspirin EC, atorvastatin, benazepril, hydrochlorothiazide, albuterol, ipratropium-albuterol, Fluticasone Furoate-Vilanterol, metoprolol, gabapentin, levothyroxine, and HYDROcodone-acetaminophen. We administered albuterol, ipratropium, and methylPREDNISolone sodium succinate.  Meds ordered this encounter  Medications  . albuterol (PROVENTIL) (2.5 MG/3ML) 0.083% nebulizer solution 5 mg    Sig:   . ipratropium (ATROVENT) nebulizer solution 0.5 mg    Sig:   . methylPREDNISolone sodium succinate (SOLU-MEDROL) 125 mg/2 mL injection 125 mg    Sig:    She was given a total of 3 nebulized aerosol treatments the first with 2 albuterol and 1 Atrovent dose in the second 2 with 2 albuterol doses each. She had modest improvement and increased wheezing actually with each treatment. She was also given 125 mg of Solu-Medrol intravenously. She was hydrated  with 1 L saline. She didn't really improve enough to merit sending her home she is very fatigued and a decision was made in consultation with the patient to send her to the emergency room for direct admission of the hospital  Appropriate red flag conditions were discussed with the patient as well as actions that should be taken.  Patient expressed his understanding.  Follow-up: No Follow-up on file.  Roselee Culver, MD

## 2015-04-22 NOTE — Progress Notes (Signed)
Spoke with Dr Blaine Hamper regarding pts heart rate of 140's resting, anxiety and questioning Bipap orders. Pt is on room air  And sats are 96%. Will hold off on Bipap for now and use as needed, will give Ativan for anxiety and metoprolol for heart rate.

## 2015-04-22 NOTE — ED Notes (Signed)
Pt sent by [facility] for further eval of asthma. Pt states that she received multiple nebulizer at urgent care and has some relief. Pt states that she has had cough, congestion and nasal drainage as well.

## 2015-04-22 NOTE — Progress Notes (Signed)
Pt refused to wear BIPAP. RA/Sats 98%.

## 2015-04-22 NOTE — ED Notes (Signed)
Xray at bedside, RT at bedside

## 2015-04-23 DIAGNOSIS — J9601 Acute respiratory failure with hypoxia: Principal | ICD-10-CM

## 2015-04-23 DIAGNOSIS — I2510 Atherosclerotic heart disease of native coronary artery without angina pectoris: Secondary | ICD-10-CM

## 2015-04-23 DIAGNOSIS — J45901 Unspecified asthma with (acute) exacerbation: Secondary | ICD-10-CM

## 2015-04-23 DIAGNOSIS — J101 Influenza due to other identified influenza virus with other respiratory manifestations: Secondary | ICD-10-CM

## 2015-04-23 LAB — EXPECTORATED SPUTUM ASSESSMENT W GRAM STAIN, RFLX TO RESP C

## 2015-04-23 LAB — EXPECTORATED SPUTUM ASSESSMENT W REFEX TO RESP CULTURE

## 2015-04-23 LAB — STREP PNEUMONIAE URINARY ANTIGEN: Strep Pneumo Urinary Antigen: NEGATIVE

## 2015-04-23 MED ORDER — IPRATROPIUM-ALBUTEROL 0.5-2.5 (3) MG/3ML IN SOLN: 3.0000 mL | RESPIRATORY_TRACT | Status: DC | PRN

## 2015-04-23 MED ORDER — OSELTAMIVIR PHOSPHATE 75 MG PO CAPS
75.0000 mg | ORAL_CAPSULE | Freq: Two times a day (BID) | ORAL | Status: AC
  Administered 2015-04-23 – 2015-04-27 (×10): 75 mg via ORAL
  Filled 2015-04-23 (×11): qty 1

## 2015-04-23 MED ORDER — ACETAMINOPHEN 325 MG PO TABS
650.0000 mg | ORAL_TABLET | ORAL | Status: DC | PRN
  Administered 2015-04-23 (×2): 650 mg via ORAL
  Filled 2015-04-23 (×2): qty 2

## 2015-04-23 MED ORDER — PNEUMOCOCCAL VAC POLYVALENT 25 MCG/0.5ML IJ INJ
0.5000 mL | INJECTION | INTRAMUSCULAR | Status: DC
  Filled 2015-04-23: qty 0.5

## 2015-04-23 MED ORDER — METHYLPREDNISOLONE SODIUM SUCC 125 MG IJ SOLR
60.0000 mg | Freq: Two times a day (BID) | INTRAMUSCULAR | Status: DC
  Administered 2015-04-23: 60 mg via INTRAVENOUS
  Filled 2015-04-23: qty 2

## 2015-04-23 MED ORDER — HYDRALAZINE HCL 20 MG/ML IJ SOLN
5.0000 mg | Freq: Once | INTRAMUSCULAR | Status: AC
  Administered 2015-04-23: 5 mg via INTRAVENOUS
  Filled 2015-04-23: qty 1

## 2015-04-23 MED ORDER — IPRATROPIUM-ALBUTEROL 0.5-2.5 (3) MG/3ML IN SOLN
3.0000 mL | Freq: Four times a day (QID) | RESPIRATORY_TRACT | Status: DC
Start: 2015-04-23 — End: 2015-04-26
  Administered 2015-04-23 – 2015-04-26 (×14): 3 mL via RESPIRATORY_TRACT
  Filled 2015-04-23 (×15): qty 3

## 2015-04-23 NOTE — Progress Notes (Signed)
PROGRESS NOTE  Hannah Scott [id number] DOB: 1950-10-19 DOA: 04/22/2015 PCP: Binnie Rail, MD  Assessment/Plan: Acute respiratory failure with hypoxia ([place]) due to asthma exacerbation: Patient's cough, shortness of breath and wheezing are consistent with asthma exacerbation. No pneumonia on chest x-ray. No history of congestive heart failure. No chest pain, less likely to have PE. ABG showed pH 7.344, PCO2 46.2, PO2 224.  -flu A positive -Nebulizers: scheduled Atrovent q3h and prn Xopenex -Solu-Medrol 60 mg IV bid -d/c abx  -Mucinex for cough  -Urine S. pneumococcal antigen negative  Flu a + -tamiflu  Sinus tachycardia: Likely due to hypoxia, which has been possibly worsened by albuterol nebs -prn Metoprolol IV for HR>130  Hypothyroidism: Last TSH was 9.26 on 02/12/15. Her Synthroid dose was increased from 50 to 75 g daily -Continue home Synthroid  Depression: Stable, no suicidal or homicidal ideations. Not all medications sent home.  -Observe  CAD (coronary artery disease): No chest pain - On ASA, metoprolol and Lipitor  HLD: Last LDL was 55 on 02/12/15 -Continue home medications: Lipitor  HTN:  -Lotensin, HCTZ, metoprolol  Back pain: -When necessary Norco  Code Status: partial Family Communication: patient Disposition Plan: tx to tele   Consultants:    Procedures:      HPI/Subjective: Still not feeling well  Objective: Filed Vitals:   04/23/15 1054 04/23/15 1124  BP: 158/81   Pulse: 117 98  Temp:    Resp:  20    Intake/Output Summary (Last 24 hours) at 04/23/15 1226 Last data filed at 04/22/15 2046  Gross per 24 hour  Intake      0 ml  Output    275 ml  Net   -275 ml   Filed Weights   04/22/15 2100  Weight: 75.751 kg (167 lb)    Exam:   General:  Awake, ill appaering  Cardiovascular: rrr  Respiratory: wheezing b/l  Abdomen: +BS, soft  Musculoskeletal: no edema   Data Reviewed: Basic Metabolic Panel:  Recent Labs Lab  04/22/15 1804  NA 141  K 3.4*  CL 105  CO2 21*  GLUCOSE 273*  BUN 9  CREATININE 0.85  CALCIUM 9.4   Liver Function Tests: No results for input(s): AST, ALT, ALKPHOS, BILITOT, PROT, ALBUMIN in the last 168 hours. No results for input(s): LIPASE, AMYLASE in the last 168 hours. No results for input(s): AMMONIA in the last 168 hours. CBC:  Recent Labs Lab 04/22/15 1804  WBC 5.4  HGB 11.8*  HCT 38.5  MCV 91.7  PLT 228   Cardiac Enzymes:  Recent Labs Lab 04/22/15 1804  TROPONINI <0.03   BNP (last 3 results) No results for input(s): BNP in the last 8760 hours.  ProBNP (last 3 results) No results for input(s): PROBNP in the last 8760 hours.  CBG: No results for input(s): GLUCAP in the last 168 hours.  Recent Results (from the past 240 hour(s))  Culture, sputum-assessment     Status: None   Collection Time: 04/22/15  7:48 PM  Result Value Ref Range Status   Specimen Description SPUTUM  Final   Special Requests NONE  Final   Sputum evaluation   Final    MICROSCOPIC FINDINGS SUGGEST THAT THIS SPECIMEN IS NOT REPRESENTATIVE OF LOWER RESPIRATORY SECRETIONS. PLEASE RECOLLECT. Gram Stain Report Called to,Read Back By and Verified With: S VIVERITO,RN AT 1147 04/23/15 BY L BENFIELD    Report Status 04/23/2015 FINAL  Final  Culture, blood (routine x 2) Call MD if unable to obtain  prior to antibiotics being given     Status: None (Preliminary result)   Collection Time: 04/22/15  8:06 PM  Result Value Ref Range Status   Specimen Description BLOOD LEFT ARM  Final   Special Requests IN PEDIATRIC BOTTLE [place]  Final   Culture NO GROWTH < 12 HOURS  Final   Report Status PENDING  Incomplete  Culture, blood (routine x 2) Call MD if unable to obtain prior to antibiotics being given     Status: None (Preliminary result)   Collection Time: 04/22/15  8:08 PM  Result Value Ref Range Status   Specimen Description BLOOD RIGHT WRIST  Final   Special Requests BOTTLES DRAWN AEROBIC AND  ANAEROBIC 5CC  Final   Culture NO GROWTH < 12 HOURS  Final   Report Status PENDING  Incomplete  MRSA PCR Screening     Status: None   Collection Time: 04/22/15  9:23 PM  Result Value Ref Range Status   MRSA by PCR NEGATIVE NEGATIVE Final    Comment:        The GeneXpert MRSA Assay (FDA approved for NASAL specimens only), is one component of a comprehensive MRSA colonization surveillance program. It is not intended to diagnose MRSA infection nor to guide or monitor treatment for MRSA infections.      Studies: Dg Chest Port 1 View  04/22/2015  CLINICAL DATA:  Shortness of Breath.  History of asthma and COPD. EXAM: PORTABLE CHEST 1 VIEW COMPARISON:  10/23/2013 FINDINGS: Elevation of the right hemidiaphragm is stable. Mild cardiomegaly. No confluent airspace opacities or effusions. No acute bony abnormality. Moderate-sized hiatal hernia, stable. IMPRESSION: Stable elevation of the right hemidiaphragm. Hiatal hernia. No acute findings. Electronically Signed   By: Rolm Baptise M.D.   On: 04/22/2015 18:20    Scheduled Meds: . aspirin EC  325 mg Oral Daily  . atorvastatin  40 mg Oral QHS  . benazepril  20 mg Oral Daily  . dextromethorphan-guaiFENesin  1 tablet Oral BID  . gabapentin  300 mg Oral QHS  . heparin  5,000 Units Subcutaneous 3 times per day  . HYDROcodone-acetaminophen  1 tablet Oral TID  . ipratropium-albuterol  3 mL Nebulization QID  . levothyroxine  75 mcg Oral QAC breakfast  . methylPREDNISolone (SOLU-MEDROL) injection  60 mg Intravenous TID  . metoprolol  50 mg Oral BID  . oseltamivir  75 mg Oral BID  . [START ON 04/24/2015] pneumococcal 23 valent vaccine  0.5 mL Intramuscular Tomorrow-1000   Continuous Infusions:  Antibiotics Given (last 72 hours)    Date/Time Action Medication Dose   04/22/15 2300 Given   azithromycin (ZITHROMAX) tablet 500 mg 500 mg   04/23/15 0329 Given   oseltamivir (TAMIFLU) capsule 75 mg 75 mg   04/23/15 1055 Given   azithromycin  (ZITHROMAX) tablet 250 mg 250 mg   04/23/15 1056 Given   oseltamivir (TAMIFLU) capsule 75 mg 75 mg      Principal Problem:   Asthma exacerbation Active Problems:   Depression   Endometriosis   Scoliosis   Hypothyroidism   CAD (coronary artery disease)   Atrial septal aneurysm   Hypertension   Shortness of breath   Acute respiratory failure with hypoxia ([facility])   Status asthmaticus   Back pain    Time spent: 25 min    [facility] Pager [phone]. If 7PM-7AM, please contact night-coverage at [email or web address], password [facility] 04/23/2015, 12:26 PM  LOS: 1 day

## 2015-04-23 NOTE — Progress Notes (Signed)
Occupational Therapy Treatment Patient Details Name: Hannah Scott MRN: [id number] DOB: [birth year] Today's Date: 04/23/2015    History of present illness Pt admitted with asthma exacerbation and also found to be flu and H1N1 positive.    OT comments  Pt was independent with assistive devices prior to admission.  Presents with generalized weakness, decreased activity tolerance, and chronic back pain.  Pt requiring minimum assistance for transfers and ADL. Pt likely to progress well as medical issues resolve. She will have assistance of her family at home. Will follow acutely, but do not anticipate pt will need OT upon discharge.  Follow Up Recommendations  No OT follow up    Equipment Recommendations  None recommended by OT    Recommendations for Other Services      Precautions / Restrictions Precautions Precautions: Fall Restrictions Weight Bearing Restrictions: No       Mobility Bed Mobility               General bed mobility comments: pt in chair, appearing sleepy, but declining back to bed  Transfers Overall transfer level: Needs assistance Equipment used: 1 person hand held assist Transfers: Sit to/from Stand;Stand Pivot Transfers Sit to Stand: Min assist Stand pivot transfers: Min assist       General transfer comment: Pt with flexed posture at baseline, assisted to rise and to steady as she pivoted to/from 3 in 1    Balance Overall balance assessment: Needs assistance         Standing balance support: Bilateral upper extremity supported;During functional activity Standing balance-Leahy Scale: Poor Standing balance comment: relies on UEs for support.                    ADL Overall ADL's : Needs assistance/impaired Eating/Feeding: Independent;Sitting   Grooming: Wash/dry hands;Sitting;Set up   Upper Body Bathing: Set up;Sitting   Lower Body Bathing: Minimal assistance;Sit to/from stand   Upper Body Dressing : Set up;Sitting   Lower Body  Dressing: Minimal assistance;Sit to/from stand   Toilet Transfer: Minimal assistance;Stand-pivot;BSC   Toileting- Clothing Manipulation and Hygiene: Supervision/safety;Sitting/lateral lean         General ADL Comments: Pt with decreased activity tolerance. Only wanting to pivot chair<>3 in 1      Vision                     Perception     Praxis      Cognition   Behavior During Therapy: Flat affect Overall Cognitive Status: Within Functional Limits for tasks assessed                       Extremity/Trunk Assessment  Upper Extremity Assessment Upper Extremity Assessment: Overall WFL for tasks assessed   Lower Extremity Assessment Lower Extremity Assessment: Generalized weakness   Cervical / Trunk Assessment Cervical / Trunk Assessment: Kyphotic (scoliosis)    Exercises General Exercises - Lower Extremity Ankle Circles/Pumps: AROM;5 reps;Both;Seated Long Arc Quad: AROM;Both;10 reps;Seated   Shoulder Instructions       General Comments      Pertinent Vitals/ Pain       Pain Assessment: Faces Faces Pain Scale: Hurts little more Pain Location: back Pain Descriptors / Indicators: Aching Pain Intervention(s): Limited activity within patient's tolerance;Monitored during session;Premedicated before session;Repositioned  Home Living Family/patient expects to be discharged to:: Private residence Living Arrangements: Alone Available Help at Discharge: Family;Available PRN/intermittently Type of Home: House Home Access: Stairs to enter [employer] of  Steps: 1 Entrance Stairs-Rails: Right Home Layout: One level     Bathroom Shower/Tub: Teacher, early years/pre: Standard     Home Equipment: Environmental consultant - 4 wheels;Bedside commode;Tub bench   Additional Comments: pt states her family will assist at home      Prior Functioning/Environment Level of Independence: Independent with assistive device(s)            Frequency Min  2X/week     Progress Toward Goals  OT Goals(current goals can now be found in the care plan section)     Acute Rehab OT Goals Patient Stated Goal: to go home OT Goal Formulation: With patient Time For Goal Achievement: 04/30/15 Potential to Achieve Goals: Good ADL Goals Pt Will Perform Grooming: with modified independence;standing Pt Will Perform Lower Body Bathing: with modified independence;sit to/from stand Pt Will Perform Lower Body Dressing: with modified independence;sit to/from stand Pt Will Transfer to Toilet: with modified independence;ambulating Pt Will Perform Toileting - Clothing Manipulation and hygiene: with modified independence;sit to/from stand Pt Will Perform Tub/Shower Transfer: Tub transfer;ambulating;tub bench;rolling walker  Plan      Co-evaluation                 End of Session Equipment Utilized During Treatment: Gait belt   Activity Tolerance Patient limited by fatigue   Patient Left in chair;with call bell/phone within reach   Nurse Communication          Time: 1520-1540 OT Time Calculation (min): 20 min  Charges: OT General Charges $OT Visit: 1 Procedure OT Evaluation $Initial OT Evaluation Tier I: 1 Procedure  Malka So 04/23/2015, 3:56 PM  [phone]

## 2015-04-23 NOTE — Progress Notes (Signed)
NP made aware of pts BP with systolic's in the [id number].

## 2015-04-23 NOTE — Progress Notes (Signed)
Patient family visiting. Informed them that the patient has influenza and that masks are to be worn. They refused. Further informed them it was very contagious and they said "I know."  Milford Cage, RN

## 2015-04-23 NOTE — Progress Notes (Signed)
NP made aware of its positive influenza panel and H1N1 detected.

## 2015-04-23 NOTE — Evaluation (Signed)
Physical Therapy Evaluation Patient Details Name: Hannah Scott MRN: [id number] DOB: 15-Jun-1950 Today's Date: 04/23/2015   History of Present Illness  Pt admitted with asthma exacerbation and also found to be flu and H1N1 positive.   Clinical Impression  Pt admitted with above diagnosis. Pt currently with functional limitations due to the deficits listed below (see PT Problem List). Pt should be fine to go home with family with 24 hour assist at d/c.  States she is close to baseline.  [place] acutely.  Pt will benefit from skilled PT to increase their independence and safety with mobility to allow discharge to the venue listed below.      Follow Up Recommendations Home health PT;Supervision/Assistance - 24 hour    Equipment Recommendations  Other (comment) (TBA)    Recommendations for Other Services       Precautions / Restrictions Precautions Precautions: Fall Restrictions Weight Bearing Restrictions: No      Mobility  Bed Mobility               General bed mobility comments: on 3N1 on arrival  Transfers Overall transfer level: Needs assistance Equipment used: 2 person hand held assist Transfers: Sit to/from [employer] Sit to Stand: Min assist Stand pivot transfers: Min assist       General transfer comment: Pt needed min assist to power up and then to take pivotal steps to chair.  Pt needed Ues for support.  Pt stooped over and states this is her baseline due to scoliosis.  Pt needed no assist to clean herself.    Ambulation/Gait             General Gait Details: unable due to fatigue per pt  Stairs            Wheelchair Mobility    Modified Rankin (Stroke Patients Only)       Balance Overall balance assessment: Needs assistance         Standing balance support: Bilateral upper extremity supported;During functional activity Standing balance-Leahy Scale: Poor Standing balance comment: relies on UEs for support.                              Pertinent Vitals/Pain Pain Assessment: Faces Faces Pain Scale: Hurts even more Pain Location: back Pain Descriptors / Indicators: Aching Pain Intervention(s): Limited activity within patient's tolerance;Monitored during session;Repositioned  HR 113-141 bpm, >90% on 3LO2.    Home Living Family/patient expects to be discharged to:: Private residence Living Arrangements: Alone Available Help at Discharge: Family;Available PRN/intermittently Type of Home: House Home Access: Stairs to enter Entrance Stairs-Rails: Right Entrance Stairs-Number of Steps: 1 Home Layout: One level Home Equipment: Walker - 4 wheels;Bedside commode;Tub bench Additional Comments: pt states her family will assist at home    Prior Function Level of Independence: Independent with assistive device(s)               Hand Dominance        Extremity/Trunk Assessment   Upper Extremity Assessment: Defer to OT evaluation           Lower Extremity Assessment: Generalized weakness      Cervical / Trunk Assessment: Kyphotic;Other exceptions (scoliosis)  Communication   Communication: No difficulties  Cognition Arousal/Alertness: Awake/alert Behavior During Therapy: Flat affect Overall Cognitive Status: Within Functional Limits for tasks assessed  General Comments General comments (skin integrity, edema, etc.): Pt states she will be fine to go home with family assist even though deconditioned.      Exercises General Exercises - Lower Extremity Ankle Circles/Pumps: AROM;5 reps;Both;Seated Long Arc Quad: AROM;Both;10 reps;Seated      Assessment/Plan    PT Assessment Patient needs continued PT services  PT Diagnosis Generalized weakness   PT Problem List Decreased activity tolerance;Decreased strength;Decreased balance;Decreased mobility;Decreased knowledge of use of DME;Decreased safety awareness;Decreased knowledge of  precautions;Pain  PT Treatment Interventions DME instruction;Gait training;Therapeutic activities;Therapeutic exercise;Functional mobility training;Stair training;Balance training;Patient/family education   PT Goals (Current goals can be found in the Care Plan section) Acute Rehab PT Goals Patient Stated Goal: to go home PT Goal Formulation: With patient Time For Goal Achievement: 05/07/15 Potential to Achieve Goals: Good    Frequency Min 3X/week   Barriers to discharge        Co-evaluation               End of Session Equipment Utilized During Treatment: Gait belt;Oxygen Activity Tolerance: Patient limited by fatigue;Patient limited by pain Patient left: in chair;with call bell/phone within reach Nurse Communication: Mobility status         Time: 1020-1043 PT Time Calculation (min) (ACUTE ONLY): 23 min   Charges:   PT Evaluation $Initial PT Evaluation Tier I: 1 Procedure PT Treatments $Therapeutic Activity: 8-22 mins   PT G CodesDenice Paradise 04/25/2015, 2:12 PM Demesha Boorman,PT Acute Rehabilitation [phone] (pager)

## 2015-04-23 NOTE — Progress Notes (Signed)
Utilization Review Completed.Hannah Scott T12/29/2016  

## 2015-04-23 NOTE — Progress Notes (Signed)
Report given to [place], RN on 5W. Patient will be transported via bed with belongings. Belongings include two bags with clothing/belongings and a hurrycane. Son, [name] was present for transport of patient. Called and informed her son, Francee Piccolo, of the transfer as well. Called [facility] to alert them of the patient's transfer and that she will continue to be on telemetry on 5W.  Milford Cage, RN

## 2015-04-24 DIAGNOSIS — E039 Hypothyroidism, unspecified: Secondary | ICD-10-CM

## 2015-04-24 MED ORDER — SODIUM CHLORIDE 0.9 % IV SOLN
INTRAVENOUS | Status: DC
  Administered 2015-04-24 – 2015-04-26 (×4): via INTRAVENOUS

## 2015-04-24 MED ORDER — DOXYCYCLINE HYCLATE 100 MG PO TABS
100.0000 mg | ORAL_TABLET | Freq: Two times a day (BID) | ORAL | Status: DC
  Administered 2015-04-24 – 2015-04-28 (×9): 100 mg via ORAL
  Filled 2015-04-24 (×9): qty 1

## 2015-04-24 MED ORDER — METHYLPREDNISOLONE SODIUM SUCC 125 MG IJ SOLR
60.0000 mg | Freq: Three times a day (TID) | INTRAMUSCULAR | Status: DC
  Administered 2015-04-24 – 2015-04-26 (×7): 60 mg via INTRAVENOUS
  Filled 2015-04-24 (×7): qty 2

## 2015-04-24 MED ORDER — ALBUTEROL SULFATE (2.5 MG/3ML) 0.083% IN NEBU: 2.5000 mg | INHALATION_SOLUTION | RESPIRATORY_TRACT | Status: DC | PRN

## 2015-04-24 NOTE — Progress Notes (Signed)
PT Cancellation Note  Patient Details Name: Hannah Scott MRN: [id number] DOB: July 14, 1950   Cancelled Treatment:    Reason Eval/Treat Not Completed: Patient declined, no reason specified.  Patient declined PT twice due to asthma and difficulty breathing.  Pt states she would be happy to work with PT if she was breathing better.  Pt reports she is familiar with LE therex she can do in sitting. Encouraged pt to perform these later if feeling better.  Will check back.   [name] 04/24/2015, 12:00 PM

## 2015-04-24 NOTE — Progress Notes (Signed)
PROGRESS NOTE  Hannah Scott [id number] DOB: December 06, 1950 DOA: 04/22/2015 PCP: Binnie Rail, MD  Assessment/Plan: Acute respiratory failure with hypoxia ([place]) due to asthma exacerbation: Patient's cough, shortness of breath and wheezing are consistent with asthma exacerbation. No pneumonia on chest x-ray. No history of congestive heart failure. No chest pain, less likely to have PE. ABG showed pH 7.344, PCO2 46.2, PO2 224.  -flu A positive -Nebulizers: scheduled Atrovent q3h and prn Xopenex -Solu-Medrol 60 mg IV bid- increase to TID as patient feels worse today -add doxy -Mucinex for cough  -Urine S. pneumococcal antigen negative  Flu a + -tamiflu  Sinus tachycardia: Likely due to hypoxia, which has been possibly worsened by albuterol nebs -prn Metoprolol IV for HR>130  Hypothyroidism: Last TSH was 9.26 on 02/12/15. Her Synthroid dose was increased from 50 to 75 g daily -Continue home Synthroid  Depression: Stable, no suicidal or homicidal ideations. Not all medications sent home.  -Observe  CAD (coronary artery disease): No chest pain - On ASA, metoprolol and Lipitor  HLD: Last LDL was 55 on 02/12/15 -Continue home medications: Lipitor  HTN:  -Lotensin, HCTZ, metoprolol  Back pain: -When necessary Norco  Code Status: partial Family Communication: patient Disposition Plan: tx to tele   Consultants:    Procedures:      HPI/Subjective: Husband died 1 year ago here with the flu   Objective: Filed Vitals:   04/23/15 1742 04/23/15 2111  BP: 160/82 121/63  Pulse: 96 107  Temp: 98.3 F (36.8 C) 98.2 F (36.8 C)  Resp: 16 20    Intake/Output Summary (Last 24 hours) at 04/24/15 0831 Last data filed at 04/23/15 1700  Gross per 24 hour  Intake      0 ml  Output    400 ml  Net   -400 ml   Filed Weights   04/22/15 2100 04/23/15 1742  Weight: 75.751 kg (167 lb) 72.576 kg (160 lb)    Exam:   General:  Awake, ill appaering, dry  mouth  Cardiovascular: rrr  Respiratory: wheezing b/l- coarse breath sounds  Abdomen: +BS, soft  Musculoskeletal: no edema   Data Reviewed: Basic Metabolic Panel:  Recent Labs Lab 04/22/15 1804  NA 141  K 3.4*  CL 105  CO2 21*  GLUCOSE 273*  BUN 9  CREATININE 0.85  CALCIUM 9.4   Liver Function Tests: No results for input(s): AST, ALT, ALKPHOS, BILITOT, PROT, ALBUMIN in the last 168 hours. No results for input(s): LIPASE, AMYLASE in the last 168 hours. No results for input(s): AMMONIA in the last 168 hours. CBC:  Recent Labs Lab 04/22/15 1804  WBC 5.4  HGB 11.8*  HCT 38.5  MCV 91.7  PLT 228   Cardiac Enzymes:  Recent Labs Lab 04/22/15 1804  TROPONINI <0.03   BNP (last 3 results) No results for input(s): BNP in the last 8760 hours.  ProBNP (last 3 results) No results for input(s): PROBNP in the last 8760 hours.  CBG: No results for input(s): GLUCAP in the last 168 hours.  Recent Results (from the past 240 hour(s))  Culture, sputum-assessment     Status: None   Collection Time: 04/22/15  7:48 PM  Result Value Ref Range Status   Specimen Description SPUTUM  Final   Special Requests NONE  Final   Sputum evaluation   Final    MICROSCOPIC FINDINGS SUGGEST THAT THIS SPECIMEN IS NOT REPRESENTATIVE OF LOWER RESPIRATORY SECRETIONS. PLEASE RECOLLECT. Gram Stain Report Called to,Read Back By and Verified  With: Loretha Brasil AT 1147 04/23/15 BY L BENFIELD    Report Status 04/23/2015 FINAL  Final  Culture, blood (routine x 2) Call MD if unable to obtain prior to antibiotics being given     Status: None (Preliminary result)   Collection Time: 04/22/15  8:06 PM  Result Value Ref Range Status   Specimen Description BLOOD LEFT ARM  Final   Special Requests IN PEDIATRIC BOTTLE [place]  Final   Culture NO GROWTH < 12 HOURS  Final   Report Status PENDING  Incomplete  Culture, blood (routine x 2) Call MD if unable to obtain prior to antibiotics being given     Status:  None (Preliminary result)   Collection Time: 04/22/15  8:08 PM  Result Value Ref Range Status   Specimen Description BLOOD RIGHT WRIST  Final   Special Requests BOTTLES DRAWN AEROBIC AND ANAEROBIC 5CC  Final   Culture NO GROWTH < 12 HOURS  Final   Report Status PENDING  Incomplete  MRSA PCR Screening     Status: None   Collection Time: 04/22/15  9:23 PM  Result Value Ref Range Status   MRSA by PCR NEGATIVE NEGATIVE Final    Comment:        The GeneXpert MRSA Assay (FDA approved for NASAL specimens only), is one component of a comprehensive MRSA colonization surveillance program. It is not intended to diagnose MRSA infection nor to guide or monitor treatment for MRSA infections.      Studies: Dg Chest Port 1 View  04/22/2015  CLINICAL DATA:  Shortness of Breath.  History of asthma and COPD. EXAM: PORTABLE CHEST 1 VIEW COMPARISON:  10/23/2013 FINDINGS: Elevation of the right hemidiaphragm is stable. Mild cardiomegaly. No confluent airspace opacities or effusions. No acute bony abnormality. Moderate-sized hiatal hernia, stable. IMPRESSION: Stable elevation of the right hemidiaphragm. Hiatal hernia. No acute findings. Electronically Signed   By: Rolm Baptise M.D.   On: 04/22/2015 18:20    Scheduled Meds: . aspirin EC  325 mg Oral Daily  . atorvastatin  40 mg Oral QHS  . benazepril  20 mg Oral Daily  . dextromethorphan-guaiFENesin  1 tablet Oral BID  . doxycycline  100 mg Oral Q12H  . gabapentin  300 mg Oral QHS  . heparin  5,000 Units Subcutaneous 3 times per day  . HYDROcodone-acetaminophen  1 tablet Oral TID  . ipratropium-albuterol  3 mL Nebulization QID  . levothyroxine  75 mcg Oral QAC breakfast  . methylPREDNISolone (SOLU-MEDROL) injection  60 mg Intravenous Q8H  . metoprolol  50 mg Oral BID  . oseltamivir  75 mg Oral BID  . pneumococcal 23 valent vaccine  0.5 mL Intramuscular Tomorrow-1000   Continuous Infusions: . sodium chloride     Antibiotics Given (last 72  hours)    Date/Time Action Medication Dose   04/22/15 2300 Given   azithromycin (ZITHROMAX) tablet 500 mg 500 mg   04/23/15 0329 Given   oseltamivir (TAMIFLU) capsule 75 mg 75 mg   04/23/15 1055 Given   azithromycin (ZITHROMAX) tablet 250 mg 250 mg   04/23/15 1056 Given   oseltamivir (TAMIFLU) capsule 75 mg 75 mg   04/23/15 2153 Given   oseltamivir (TAMIFLU) capsule 75 mg 75 mg      Principal Problem:   Asthma exacerbation Active Problems:   Depression   Endometriosis   Scoliosis   Hypothyroidism   CAD (coronary artery disease)   Atrial septal aneurysm   Hypertension   Shortness of breath  Acute respiratory failure with hypoxia (HCC)   Status asthmaticus   Back pain   Influenza A    Time spent: 25 min    [place] Pager [phone]. If 7PM-7AM, please contact night-coverage at [email or web address], password [facility] 04/24/2015, 8:31 AM  LOS: 2 days

## 2015-04-25 LAB — CBC
HEMATOCRIT: 39.2 % (ref 36.0–46.0)
Hemoglobin: 11.8 g/dL — ABNORMAL LOW (ref 12.0–15.0)
MCH: 28 pg (ref 26.0–34.0)
MCHC: 30.1 g/dL (ref 30.0–36.0)
MCV: 92.9 fL (ref 78.0–100.0)
Platelets: 259 10*3/uL (ref 150–400)
RBC: 4.22 MIL/uL (ref 3.87–5.11)
RDW: 14.1 % (ref 11.5–15.5)
WBC: 6.2 10*3/uL (ref 4.0–10.5)

## 2015-04-25 LAB — BASIC METABOLIC PANEL
Anion gap: 11 (ref 5–15)
BUN: 20 mg/dL (ref 6–20)
CALCIUM: 9.3 mg/dL (ref 8.9–10.3)
CO2: 31 mmol/L (ref 22–32)
CREATININE: 0.45 mg/dL (ref 0.44–1.00)
Chloride: 100 mmol/L — ABNORMAL LOW (ref 101–111)
GFR calc non Af Amer: >60 mL/min (ref >60)
Glucose, Bld: 124 mg/dL — ABNORMAL HIGH (ref 65–99)
Potassium: 4.9 mmol/L (ref 3.5–5.1)
Sodium: 142 mmol/L (ref 135–145)

## 2015-04-25 NOTE — Progress Notes (Signed)
PROGRESS NOTE  Hannah Scott [id number] DOB: 1950-06-17 DOA: 04/22/2015 PCP: Binnie Rail, MD  Assessment/Plan: Acute respiratory failure with hypoxia ([place]) due to asthma exacerbation: Patient's cough, shortness of breath and wheezing are consistent with asthma exacerbation. No pneumonia on chest x-ray. No history of congestive heart failure. No chest pain, less likely to have PE. ABG showed pH 7.344, PCO2 46.2, PO2 224.  -flu A positive -Nebulizers: scheduled Atrovent q3h and prn Xopenex -Solu-Medrol 60 mg IV bid- increase to TID as patient feels worse today -add doxy -Mucinex for cough  -Urine S. pneumococcal antigen negative  Flu a + -tamiflu  Sinus tachycardia: Likely due to hypoxia, which has been possibly worsened by albuterol nebs -prn Metoprolol IV for HR>130  Hypothyroidism: Last TSH was 9.26 on 02/12/15. Her Synthroid dose was increased from 50 to 75 g daily -Continue home Synthroid  Depression: Stable, no suicidal or homicidal ideations. Not all medications sent home.  -Observe  CAD (coronary artery disease): No chest pain - On ASA, metoprolol and Lipitor  HLD: Last LDL was 55 on 02/12/15 -Continue home medications: Lipitor  HTN:  -Lotensin, HCTZ, metoprolol  Back pain: -When necessary Norco  Code Status: partial Family Communication: patient Disposition Plan: tx to tele   Consultants:    Procedures:      HPI/Subjective: Breathing finally started getting better yesterday Able to take a bath   Objective: Filed Vitals:   04/25/15 0658 04/25/15 1000  BP: 164/75 150/67  Pulse: 78 107  Temp:    Resp:      Intake/Output Summary (Last 24 hours) at 04/25/15 1204 Last data filed at 04/25/15 0547  Gross per 24 hour  Intake 1134.17 ml  Output    600 ml  Net 534.17 ml   Filed Weights   04/22/15 2100 04/23/15 1742  Weight: 75.751 kg (167 lb) 72.576 kg (160 lb)    Exam:   General:  Awake, breathing easier  Cardiovascular:  rrr  Respiratory: moving more air, decreased wheezing  Abdomen: +BS, soft  Musculoskeletal: no edema   Data Reviewed: Basic Metabolic Panel:  Recent Labs Lab 04/22/15 1804 04/25/15 0639  NA 141 142  K 3.4* 4.9  CL 105 100*  CO2 21* 31  GLUCOSE 273* 124*  BUN 9 20  CREATININE 0.85 0.45  CALCIUM 9.4 9.3   Liver Function Tests: No results for input(s): AST, ALT, ALKPHOS, BILITOT, PROT, ALBUMIN in the last 168 hours. No results for input(s): LIPASE, AMYLASE in the last 168 hours. No results for input(s): AMMONIA in the last 168 hours. CBC:  Recent Labs Lab 04/22/15 1804 04/25/15 0639  WBC 5.4 6.2  HGB 11.8* 11.8*  HCT 38.5 39.2  MCV 91.7 92.9  PLT 228 259   Cardiac Enzymes:  Recent Labs Lab 04/22/15 1804  TROPONINI <0.03   BNP (last 3 results) No results for input(s): BNP in the last 8760 hours.  ProBNP (last 3 results) No results for input(s): PROBNP in the last 8760 hours.  CBG: No results for input(s): GLUCAP in the last 168 hours.  Recent Results (from the past 240 hour(s))  Culture, sputum-assessment     Status: None   Collection Time: 04/22/15  7:48 PM  Result Value Ref Range Status   Specimen Description SPUTUM  Final   Special Requests NONE  Final   Sputum evaluation   Final    MICROSCOPIC FINDINGS SUGGEST THAT THIS SPECIMEN IS NOT REPRESENTATIVE OF LOWER RESPIRATORY SECRETIONS. PLEASE RECOLLECT. Gram Stain Report Called to,Read  Back By and Verified With: S VIVERITO,RN AT 1147 04/23/15 BY L BENFIELD    Report Status 04/23/2015 FINAL  Final  Culture, blood (routine x 2) Call MD if unable to obtain prior to antibiotics being given     Status: None (Preliminary result)   Collection Time: 04/22/15  8:06 PM  Result Value Ref Range Status   Specimen Description BLOOD LEFT ARM  Final   Special Requests IN PEDIATRIC BOTTLE [place]  Final   Culture NO GROWTH 3 DAYS  Final   Report Status PENDING  Incomplete  Culture, blood (routine x 2) Call MD if  unable to obtain prior to antibiotics being given     Status: None (Preliminary result)   Collection Time: 04/22/15  8:08 PM  Result Value Ref Range Status   Specimen Description BLOOD RIGHT WRIST  Final   Special Requests BOTTLES DRAWN AEROBIC AND ANAEROBIC 5CC  Final   Culture NO GROWTH 3 DAYS  Final   Report Status PENDING  Incomplete  MRSA PCR Screening     Status: None   Collection Time: 04/22/15  9:23 PM  Result Value Ref Range Status   MRSA by PCR NEGATIVE NEGATIVE Final    Comment:        The GeneXpert MRSA Assay (FDA approved for NASAL specimens only), is one component of a comprehensive MRSA colonization surveillance program. It is not intended to diagnose MRSA infection nor to guide or monitor treatment for MRSA infections.      Studies: No results found.  Scheduled Meds: . aspirin EC  325 mg Oral Daily  . atorvastatin  40 mg Oral QHS  . benazepril  20 mg Oral Daily  . dextromethorphan-guaiFENesin  1 tablet Oral BID  . doxycycline  100 mg Oral Q12H  . gabapentin  300 mg Oral QHS  . heparin  5,000 Units Subcutaneous 3 times per day  . HYDROcodone-acetaminophen  1 tablet Oral TID  . ipratropium-albuterol  3 mL Nebulization QID  . levothyroxine  75 mcg Oral QAC breakfast  . methylPREDNISolone (SOLU-MEDROL) injection  60 mg Intravenous Q8H  . metoprolol  50 mg Oral BID  . oseltamivir  75 mg Oral BID  . pneumococcal 23 valent vaccine  0.5 mL Intramuscular Tomorrow-1000   Continuous Infusions: . sodium chloride 50 mL/hr at 04/24/15 1719   Antibiotics Given (last 72 hours)    Date/Time Action Medication Dose   04/22/15 2300 Given   azithromycin (ZITHROMAX) tablet 500 mg 500 mg   04/23/15 0329 Given   oseltamivir (TAMIFLU) capsule 75 mg 75 mg   04/23/15 1055 Given   azithromycin (ZITHROMAX) tablet 250 mg 250 mg   04/23/15 1056 Given   oseltamivir (TAMIFLU) capsule 75 mg 75 mg   04/23/15 2153 Given   oseltamivir (TAMIFLU) capsule 75 mg 75 mg   04/24/15 0930  Given   oseltamivir (TAMIFLU) capsule 75 mg 75 mg   04/24/15 0930 Given   doxycycline (VIBRA-TABS) tablet 100 mg 100 mg   04/24/15 2117 Given   oseltamivir (TAMIFLU) capsule 75 mg 75 mg   04/24/15 2117 Given   doxycycline (VIBRA-TABS) tablet 100 mg 100 mg   04/25/15 1000 Given   oseltamivir (TAMIFLU) capsule 75 mg 75 mg   04/25/15 1000 Given   doxycycline (VIBRA-TABS) tablet 100 mg 100 mg      Principal Problem:   Asthma exacerbation Active Problems:   Depression   Endometriosis   Scoliosis   Hypothyroidism   CAD (coronary artery disease)  Atrial septal aneurysm   Hypertension   Shortness of breath   Acute respiratory failure with hypoxia (HCC)   Status asthmaticus   Back pain   Influenza A    Time spent: 25 min    [place] [facility] Pager [phone]. If 7PM-7AM, please contact night-coverage at [email or web address], password [facility] 04/25/2015, 12:04 PM  LOS: 3 days

## 2015-04-26 DIAGNOSIS — I10 Essential (primary) hypertension: Secondary | ICD-10-CM

## 2015-04-26 MED ORDER — METHYLPREDNISOLONE SODIUM SUCC 40 MG IJ SOLR
40.0000 mg | Freq: Three times a day (TID) | INTRAMUSCULAR | Status: DC
  Administered 2015-04-26 – 2015-04-27 (×3): 40 mg via INTRAVENOUS
  Filled 2015-04-26 (×3): qty 1

## 2015-04-26 MED ORDER — IPRATROPIUM-ALBUTEROL 0.5-2.5 (3) MG/3ML IN SOLN
3.0000 mL | Freq: Two times a day (BID) | RESPIRATORY_TRACT | Status: DC
  Administered 2015-04-27 – 2015-04-28 (×3): 3 mL via RESPIRATORY_TRACT
  Filled 2015-04-26 (×2): qty 3

## 2015-04-26 NOTE — Progress Notes (Signed)
SATURATION QUALIFICATIONS: (This note is used to comply with regulatory documentation for home oxygen)  Patient Saturations on Room Air at Rest = 97%  Patient Saturations on Room Air while Ambulating = 95%   Please briefly explain why patient needs home oxygen: does not qualify

## 2015-04-26 NOTE — Progress Notes (Signed)
PROGRESS NOTE  Hannah Scott [id number] DOB: 11-Dec-1950 DOA: 04/22/2015 PCP: Binnie Rail, MD  Assessment/Plan: Acute respiratory failure with hypoxia ([place]) due to asthma exacerbation: Patient's cough, shortness of breath and wheezing are consistent with asthma exacerbation.  -flu A positive -Nebulizers: scheduled Atrovent q3h and prn Xopenex -Solu-Medrol wean slowly -add doxy -Mucinex for cough  -Urine S. pneumococcal antigen negative  Flu a + -tamiflu x 5 days  Sinus tachycardia: Likely due to hypoxia, which has been possibly worsened by albuterol nebs -prn Metoprolol IV for HR>130  Hypothyroidism: Last TSH was 9.26 on 02/12/15. Her Synthroid dose was increased from 50 to 75 g daily -Continue home Synthroid  Depression: Stable, no suicidal or homicidal ideations. Not all medications sent home.  -Observe  CAD (coronary artery disease): No chest pain - On ASA, metoprolol and Lipitor  HLD: Last LDL was 55 on 02/12/15 -Continue home medications: Lipitor  HTN:  -Lotensin, HCTZ, metoprolol  Back pain: -When necessary Norco  Code Status: partial Family Communication: patient Disposition Plan: home 1-2 days- does not need O2   Consultants:    Procedures:      HPI/Subjective: Still wheezing some  Objective: Filed Vitals:   04/26/15 0517 04/26/15 1048  BP: 170/80 147/71  Pulse: 92 104  Temp: 98 F (36.7 C)   Resp: 18     Intake/Output Summary (Last 24 hours) at 04/26/15 1255 Last data filed at 04/26/15 1117  Gross per 24 hour  Intake 1222.5 ml  Output   2500 ml  Net -1277.5 ml   Filed Weights   04/22/15 2100 04/23/15 1742  Weight: 75.751 kg (167 lb) 72.576 kg (160 lb)    Exam:   General:  Awake, breathing easier  Cardiovascular: rrr  Respiratory: moving more air, decreased wheezing  Abdomen: +BS, soft  Musculoskeletal: no edema   Data Reviewed: Basic Metabolic Panel:  Recent Labs Lab 04/22/15 1804 04/25/15 0639  NA 141 142   K 3.4* 4.9  CL 105 100*  CO2 21* 31  GLUCOSE 273* 124*  BUN 9 20  CREATININE 0.85 0.45  CALCIUM 9.4 9.3   Liver Function Tests: No results for input(s): AST, ALT, ALKPHOS, BILITOT, PROT, ALBUMIN in the last 168 hours. No results for input(s): LIPASE, AMYLASE in the last 168 hours. No results for input(s): AMMONIA in the last 168 hours. CBC:  Recent Labs Lab 04/22/15 1804 04/25/15 0639  WBC 5.4 6.2  HGB 11.8* 11.8*  HCT 38.5 39.2  MCV 91.7 92.9  PLT 228 259   Cardiac Enzymes:  Recent Labs Lab 04/22/15 1804  TROPONINI <0.03   BNP (last 3 results) No results for input(s): BNP in the last 8760 hours.  ProBNP (last 3 results) No results for input(s): PROBNP in the last 8760 hours.  CBG: No results for input(s): GLUCAP in the last 168 hours.  Recent Results (from the past 240 hour(s))  Culture, sputum-assessment     Status: None   Collection Time: 04/22/15  7:48 PM  Result Value Ref Range Status   Specimen Description SPUTUM  Final   Special Requests NONE  Final   Sputum evaluation   Final    MICROSCOPIC FINDINGS SUGGEST THAT THIS SPECIMEN IS NOT REPRESENTATIVE OF LOWER RESPIRATORY SECRETIONS. PLEASE RECOLLECT. Gram Stain Report Called to,Read Back By and Verified With: S VIVERITO,RN AT 1147 04/23/15 BY L BENFIELD    Report Status 04/23/2015 FINAL  Final  Culture, blood (routine x 2) Call MD if unable to obtain prior to antibiotics  being given     Status: None (Preliminary result)   Collection Time: 04/22/15  8:06 PM  Result Value Ref Range Status   Specimen Description BLOOD LEFT ARM  Final   Special Requests IN PEDIATRIC BOTTLE [place]  Final   Culture NO GROWTH 3 DAYS  Final   Report Status PENDING  Incomplete  Culture, blood (routine x 2) Call MD if unable to obtain prior to antibiotics being given     Status: None (Preliminary result)   Collection Time: 04/22/15  8:08 PM  Result Value Ref Range Status   Specimen Description BLOOD RIGHT WRIST  Final   Special  Requests BOTTLES DRAWN AEROBIC AND ANAEROBIC 5CC  Final   Culture NO GROWTH 3 DAYS  Final   Report Status PENDING  Incomplete  MRSA PCR Screening     Status: None   Collection Time: 04/22/15  9:23 PM  Result Value Ref Range Status   MRSA by PCR NEGATIVE NEGATIVE Final    Comment:        The GeneXpert MRSA Assay (FDA approved for NASAL specimens only), is one component of a comprehensive MRSA colonization surveillance program. It is not intended to diagnose MRSA infection nor to guide or monitor treatment for MRSA infections.      Studies: No results found.  Scheduled Meds: . aspirin EC  325 mg Oral Daily  . atorvastatin  40 mg Oral QHS  . benazepril  20 mg Oral Daily  . dextromethorphan-guaiFENesin  1 tablet Oral BID  . doxycycline  100 mg Oral Q12H  . gabapentin  300 mg Oral QHS  . heparin  5,000 Units Subcutaneous 3 times per day  . HYDROcodone-acetaminophen  1 tablet Oral TID  . ipratropium-albuterol  3 mL Nebulization QID  . levothyroxine  75 mcg Oral QAC breakfast  . methylPREDNISolone (SOLU-MEDROL) injection  40 mg Intravenous Q8H  . metoprolol  50 mg Oral BID  . oseltamivir  75 mg Oral BID  . pneumococcal 23 valent vaccine  0.5 mL Intramuscular Tomorrow-1000   Continuous Infusions:   Antibiotics Given (last 72 hours)    Date/Time Action Medication Dose   04/23/15 2153 Given   oseltamivir (TAMIFLU) capsule 75 mg 75 mg   04/24/15 0930 Given   oseltamivir (TAMIFLU) capsule 75 mg 75 mg   04/24/15 0930 Given   doxycycline (VIBRA-TABS) tablet 100 mg 100 mg   04/24/15 2117 Given   oseltamivir (TAMIFLU) capsule 75 mg 75 mg   04/24/15 2117 Given   doxycycline (VIBRA-TABS) tablet 100 mg 100 mg   04/25/15 1000 Given   oseltamivir (TAMIFLU) capsule 75 mg 75 mg   04/25/15 1000 Given   doxycycline (VIBRA-TABS) tablet 100 mg 100 mg   04/25/15 2121 Given   oseltamivir (TAMIFLU) capsule 75 mg 75 mg   04/25/15 2122 Given   doxycycline (VIBRA-TABS) tablet 100 mg 100 mg    04/26/15 1049 Given   oseltamivir (TAMIFLU) capsule 75 mg 75 mg   04/26/15 1049 Given   doxycycline (VIBRA-TABS) tablet 100 mg 100 mg      Principal Problem:   Asthma exacerbation Active Problems:   Depression   Endometriosis   Scoliosis   Hypothyroidism   CAD (coronary artery disease)   Atrial septal aneurysm   Hypertension   Shortness of breath   Acute respiratory failure with hypoxia (HCC)   Status asthmaticus   Back pain   Influenza A    Time spent: 25 min    [place]  [facility] Pager [phone]. If 7PM-7AM, please contact night-coverage at [email or web address], password [facility] 04/26/2015, 12:55 PM  LOS: 4 days

## 2015-04-27 LAB — CULTURE, BLOOD (ROUTINE X 2)
CULTURE: NO GROWTH
Culture: NO GROWTH

## 2015-04-27 MED ORDER — PREDNISONE 50 MG PO TABS
50.0000 mg | ORAL_TABLET | Freq: Every day | ORAL | Status: DC
  Administered 2015-04-28: 50 mg via ORAL
  Filled 2015-04-27: qty 1

## 2015-04-27 NOTE — Progress Notes (Signed)
Physical Therapy Treatment Patient Details Name: Hannah Scott MRN: [id number] DOB: 1950-08-19 Today's Date: 04/27/2015    History of Present Illness Pt admitted with asthma exacerbation and also found to be flu and H1N1 positive.     PT Comments    Pt is feeling better and confident in her ability to manage at home; she is currently declining HHPT; noted her O2 sats remained stable with amb yesterday with RN, and pt walked on Room air and was mildly dyspneic, recovered quickly with seated rest  From a mobility standpoint, she is ready for dc home; While she could benefit from HHPT follow-up, she is declining   Follow Up Recommendations  No PT follow up;Supervision - Intermittent     Equipment Recommendations  None recommended by PT (Pt states she is well-equipped to manage at home)    Recommendations for Other Services       Precautions / Restrictions Precautions Precautions: Fall Restrictions Weight Bearing Restrictions: No    Mobility  Bed Mobility                  Transfers Overall transfer level: Needs assistance Equipment used: None Transfers: Sit to/from Stand Sit to Stand: Supervision         General transfer comment: Pt with flexed posture at baseline, Noted heavy dependence on UE push, but she did not need physical assist  Ambulation/Gait Ambulation/Gait assistance: Min guard (without physical contact) Ambulation Distance (Feet): 150 Feet Assistive device: Rolling walker (2 wheeled) Gait Pattern/deviations: Step-through pattern;Trunk flexed     General Gait Details: Walked with forearms on RW secondary to extreme trunk flexion from scoliosis/kyphosis; Cues to self-monitor for activity tolerance; walked on Room Air and dyspnea increased to 2/4   Stairs Stairs:  (Pt declined stairs)          Wheelchair Mobility    Modified Rankin (Stroke Patients Only)       Balance             Standing balance-Leahy Scale: Fair                      Cognition Arousal/Alertness: Awake/alert Behavior During Therapy: Flat affect Overall Cognitive Status: Within Functional Limits for tasks assessed                      Exercises      General Comments General comments (skin integrity, edema, etc.): [name] continues to state that she will be just fine at home, and she is declining HHPT      Pertinent Vitals/Pain Pain Assessment: Faces Faces Pain Scale: Hurts little more Pain Location: back and tingling in bi hands Pain Descriptors / Indicators: Aching;Pins and needles Pain Intervention(s): Monitored during session;Premedicated before session;Repositioned    Home Living                      Prior Function            PT Goals (current goals can now be found in the care plan section) Acute Rehab PT Goals Patient Stated Goal: to go home PT Goal Formulation: With patient Time For Goal Achievement: 05/07/15 Potential to Achieve Goals: Good Progress towards PT goals: Progressing toward goals (gait goal distance surpassed)    Frequency  Min 3X/week    PT Plan Discharge plan needs to be updated    Co-evaluation             End of  Session   Activity Tolerance: Patient tolerated treatment well Patient left: in chair;with call bell/phone within reach     Time: 1152-1204 PT Time Calculation (min) (ACUTE ONLY): 12 min  Charges:  $Gait Training: 8-22 mins                    G Codes:      Roney Marion Hamff 04/27/2015, 12:24 PM  Roney Marion, [place] Pager [phone] Office [phone]

## 2015-04-27 NOTE — Progress Notes (Signed)
PROGRESS NOTE  Hannah Scott [id number] DOB: 1950-09-23 DOA: 04/22/2015 PCP: Binnie Rail, MD  Assessment/Plan: Acute respiratory failure with hypoxia ([place]) due to asthma exacerbation: Patient's cough, shortness of breath and wheezing are consistent with asthma exacerbation.  -flu A positive -Nebulizers: scheduled Atrovent q3h and prn Xopenex -Solu-Medrol wean to PO prednisone for the AM -doxy -Mucinex for cough  -Urine S. pneumococcal antigen negative  Flu a + -tamiflu x 5 days  Sinus tachycardia: Likely due to hypoxia, which has been possibly worsened by albuterol nebs  Hypothyroidism: Last TSH was 9.26 on 02/12/15. Her Synthroid dose was increased from 50 to 75 g daily -Continue home Synthroid  Depression: Stable, no suicidal or homicidal ideations. Not all medications sent home.  -Observe  CAD (coronary artery disease): No chest pain - On ASA, metoprolol and Lipitor  HLD: Last LDL was 55 on 02/12/15 -Continue home medications: Lipitor  HTN:  -Lotensin, HCTZ, metoprolol  Back pain: -When necessary Norco  Code Status: partial Family Communication: patient Disposition Plan: home in AM does not need O2   Consultants:    Procedures:      HPI/Subjective: Breathing improved from admission, anxious to go home  Objective: Filed Vitals:   04/27/15 0550 04/27/15 0915  BP: 178/77 144/70  Pulse: 84 110  Temp: 97.6 F (36.4 C)   Resp: 20     Intake/Output Summary (Last 24 hours) at 04/27/15 1111 Last data filed at 04/26/15 1117  Gross per 24 hour  Intake      0 ml  Output    300 ml  Net   -300 ml   Filed Weights   04/22/15 2100 04/23/15 1742  Weight: 75.751 kg (167 lb) 72.576 kg (160 lb)    Exam:   General:  Awake, breathing easier  Cardiovascular: rrr  Respiratory: moving more air, decreased wheezing  Abdomen: +BS, soft  Musculoskeletal: no edema   Data Reviewed: Basic Metabolic Panel:  Recent Labs Lab 04/22/15 1804  04/25/15 0639  NA 141 142  K 3.4* 4.9  CL 105 100*  CO2 21* 31  GLUCOSE 273* 124*  BUN 9 20  CREATININE 0.85 0.45  CALCIUM 9.4 9.3   Liver Function Tests: No results for input(s): AST, ALT, ALKPHOS, BILITOT, PROT, ALBUMIN in the last 168 hours. No results for input(s): LIPASE, AMYLASE in the last 168 hours. No results for input(s): AMMONIA in the last 168 hours. CBC:  Recent Labs Lab 04/22/15 1804 04/25/15 0639  WBC 5.4 6.2  HGB 11.8* 11.8*  HCT 38.5 39.2  MCV 91.7 92.9  PLT 228 259   Cardiac Enzymes:  Recent Labs Lab 04/22/15 1804  TROPONINI <0.03   BNP (last 3 results) No results for input(s): BNP in the last 8760 hours.  ProBNP (last 3 results) No results for input(s): PROBNP in the last 8760 hours.  CBG: No results for input(s): GLUCAP in the last 168 hours.  Recent Results (from the past 240 hour(s))  Culture, sputum-assessment     Status: None   Collection Time: 04/22/15  7:48 PM  Result Value Ref Range Status   Specimen Description SPUTUM  Final   Special Requests NONE  Final   Sputum evaluation   Final    MICROSCOPIC FINDINGS SUGGEST THAT THIS SPECIMEN IS NOT REPRESENTATIVE OF LOWER RESPIRATORY SECRETIONS. PLEASE RECOLLECT. Gram Stain Report Called to,Read Back By and Verified With: S VIVERITO,RN AT 1147 04/23/15 BY L BENFIELD    Report Status 04/23/2015 FINAL  Final  Culture, blood (  routine x 2) Call MD if unable to obtain prior to antibiotics being given     Status: None   Collection Time: 04/22/15  8:06 PM  Result Value Ref Range Status   Specimen Description BLOOD LEFT ARM  Final   Special Requests IN PEDIATRIC BOTTLE [place]  Final   Culture NO GROWTH 5 DAYS  Final   Report Status 04/27/2015 FINAL  Final  Culture, blood (routine x 2) Call MD if unable to obtain prior to antibiotics being given     Status: None   Collection Time: 04/22/15  8:08 PM  Result Value Ref Range Status   Specimen Description BLOOD RIGHT WRIST  Final   Special Requests  BOTTLES DRAWN AEROBIC AND ANAEROBIC 5CC  Final   Culture NO GROWTH 5 DAYS  Final   Report Status 04/27/2015 FINAL  Final  MRSA PCR Screening     Status: None   Collection Time: 04/22/15  9:23 PM  Result Value Ref Range Status   MRSA by PCR NEGATIVE NEGATIVE Final    Comment:        The GeneXpert MRSA Assay (FDA approved for NASAL specimens only), is one component of a comprehensive MRSA colonization surveillance program. It is not intended to diagnose MRSA infection nor to guide or monitor treatment for MRSA infections.      Studies: No results found.  Scheduled Meds: . aspirin EC  325 mg Oral Daily  . atorvastatin  40 mg Oral QHS  . benazepril  20 mg Oral Daily  . dextromethorphan-guaiFENesin  1 tablet Oral BID  . doxycycline  100 mg Oral Q12H  . gabapentin  300 mg Oral QHS  . HYDROcodone-acetaminophen  1 tablet Oral TID  . ipratropium-albuterol  3 mL Nebulization BID  . levothyroxine  75 mcg Oral QAC breakfast  . metoprolol  50 mg Oral BID  . oseltamivir  75 mg Oral BID  . pneumococcal 23 valent vaccine  0.5 mL Intramuscular Tomorrow-1000  . [START ON 04/28/2015] predniSONE  50 mg Oral Q breakfast   Continuous Infusions:   Antibiotics Given (last 72 hours)    Date/Time Action Medication Dose   04/24/15 2117 Given   oseltamivir (TAMIFLU) capsule 75 mg 75 mg   04/24/15 2117 Given   doxycycline (VIBRA-TABS) tablet 100 mg 100 mg   04/25/15 1000 Given   oseltamivir (TAMIFLU) capsule 75 mg 75 mg   04/25/15 1000 Given   doxycycline (VIBRA-TABS) tablet 100 mg 100 mg   04/25/15 2121 Given   oseltamivir (TAMIFLU) capsule 75 mg 75 mg   04/25/15 2122 Given   doxycycline (VIBRA-TABS) tablet 100 mg 100 mg   04/26/15 1049 Given   oseltamivir (TAMIFLU) capsule 75 mg 75 mg   04/26/15 1049 Given   doxycycline (VIBRA-TABS) tablet 100 mg 100 mg   04/26/15 2123 Given   oseltamivir (TAMIFLU) capsule 75 mg 75 mg   04/26/15 2124 Given   doxycycline (VIBRA-TABS) tablet 100 mg 100  mg   04/27/15 0915 Given   doxycycline (VIBRA-TABS) tablet 100 mg 100 mg      Principal Problem:   Asthma exacerbation Active Problems:   Depression   Endometriosis   Scoliosis   Hypothyroidism   CAD (coronary artery disease)   Atrial septal aneurysm   Hypertension   Shortness of breath   Acute respiratory failure with hypoxia (HCC)   Status asthmaticus   Back pain   Influenza A    Time spent: 25 min    [place]  [facility] Pager [phone]. If 7PM-7AM, please contact night-coverage at [email or web address], password [facility] 04/27/2015, 11:11 AM  LOS: 5 days

## 2015-04-27 NOTE — Progress Notes (Signed)
Occupational Therapy Cancel and Discharge Patient Details Name: Hannah Scott MRN: [id number] DOB: 1950/07/13 Today's Date: 04/27/2015   Cancelled Treatment:    Reason Eval/Treat Not Completed: Patient declined, no reason specified.   Upon entering room, pt found seated in recliner with two family members present. As soon as therapist introduced self, pt rolled eyes and stated "I just walked the hallway". Therapist explained difference between OT and PT. Pt expressed that she did not need therapy and stated "I have scoliosis, that's why I have a hard time walking. I have asthma, that's why I have a hard time breathing". OT then proceeded to explain energy conservation strategies, however patient would cut therapist off and continued to state she did not need OT. Patient adamantly refusing even with OT explaining role and how OT could help patient. At this time, am signing off. Please send text page to OT services if any questions, concerns, or with new orders: [phone] OR call office at [phone].    Patient discharged from OT services secondary to Pt adamantly refusing and does not want OT to come back.  Please see latest therapy progress note for current level of functioning and progress toward goals.    Progress and discharge plan discussed with patient and/or caregiver: Patient/Caregiver agrees with plan. Family member present in room stating she would be available to assist patient prn post acute d/c.    Chrys Racer , MS, OTR/L, CLT ,Pager: [phone]  04/27/2015, 1:50 PM

## 2015-04-28 MED ORDER — PANTOPRAZOLE SODIUM 40 MG PO TBEC
40.0000 mg | DELAYED_RELEASE_TABLET | Freq: Every day | ORAL | Status: DC
  Administered 2015-04-28: 40 mg via ORAL
  Filled 2015-04-28: qty 1

## 2015-04-28 MED ORDER — DOXYCYCLINE HYCLATE 100 MG PO TABS: 100.0000 mg | ORAL_TABLET | Freq: Two times a day (BID) | ORAL | Status: DC

## 2015-04-28 MED ORDER — PREDNISONE 10 MG PO TABS: ORAL_TABLET | ORAL | Status: DC

## 2015-04-28 MED ORDER — DM-GUAIFENESIN ER 30-600 MG PO TB12: 1.0000 | ORAL_TABLET | Freq: Two times a day (BID) | ORAL | Status: DC

## 2015-04-28 MED ORDER — PANTOPRAZOLE SODIUM 40 MG PO TBEC: 40.0000 mg | DELAYED_RELEASE_TABLET | Freq: Every day | ORAL | Status: DC

## 2015-04-28 NOTE — Care Management Important Message (Signed)
  Important Message  Patient Details  Name: Hannah Scott MRN: [id number] Date of Birth: 03/05/1951   Medicare Important Message Given:  Yes    Carles Collet, RN 04/28/2015, 10:11 AMImportant Message  Patient Details  Name: Hannah Scott MRN: [id number] Date of Birth: [birth year]   Medicare Important Message Given:  Yes    Carles Collet, RN 04/28/2015, 10:11 AM

## 2015-04-28 NOTE — Care Management Note (Signed)
Case Management Note  Patient Details  Name: IDMAN VITTITOE MRN: [id number] Date of Birth: June 07, 1950  Subjective/Objective:                  Patient admitted from home, independent. Patient treated for asthma and the flu.  Action/Plan:  CM spoke with patient. No CM needs identified, patient DC to home, self care  Expected Discharge Date:                  Expected Discharge Plan:  Home/Self Care  In-House Referral:     Discharge planning Services  CM Consult  Post Acute Care Choice:    Choice offered to:     DME Arranged:    DME Agency:     HH Arranged:    [place] Agency:     Status of Service:  Completed, signed off  Medicare Important Message Given:  Yes Date Medicare IM Given:    Medicare IM give by:    Date Additional Medicare IM Given:    Additional Medicare Important Message give by:     If discussed at [place] of Stay Meetings, dates discussed:    Additional Comments:  Carles Collet, RN 04/28/2015, 11:47 AM

## 2015-04-28 NOTE — Progress Notes (Signed)
Pt given discharge instructions, prescriptions, and care notes. Pt verbalized understanding AEB no further questions or concerns at this time. IV was discontinued, no redness, pain, or swelling noted at this time. Telemetry discontinued and Centralized Telemetry was notified. Pt left the floor via wheelchair with staff in stable condition. 

## 2015-04-28 NOTE — Discharge Summary (Signed)
Physician Discharge Summary  Hannah Scott [id number] DOB: [birth year] DOA: 04/22/2015  PCP: Binnie Rail, MD  Admit date: 04/22/2015 Discharge date: 04/28/2015  Time spent: 35 minutes  Recommendations for Outpatient Follow-up:  1. Outpatient referral to pulm-- ? Dr. Lake Bells 2. Still with depression from husbands death last year-- may need SSRI   Discharge Diagnoses:  Principal Problem:   Asthma exacerbation Active Problems:   Depression   Endometriosis   Scoliosis   Hypothyroidism   CAD (coronary artery disease)   Atrial septal aneurysm   Hypertension   Shortness of breath   Acute respiratory failure with hypoxia ([place])   Status asthmaticus   Back pain   Influenza A   Discharge Condition: improved  Diet recommendation: cardiac  Filed Weights   04/22/15 2100 04/23/15 1742  Weight: 75.751 kg (167 lb) 72.576 kg (160 lb)    History of present illness:  Hannah Scott is a 65 y.o. female with PMH of asthma, hypertension, hyperlipidemia, GERD, hypothyroidism, depression, CAD, atrioseptal aneurysm, breast cancer 2032 (S/P of lumpectomy, radiation therapy and tamoxifen treatment) cyst, endometriosis, scoliosis, who presents with cough, short of breath and wheezing.  Patient reports that she has been having cough, shortness of breath, chest congestion, chest tightness in the past 4 days. She was seen in urgent care today, and received breathing treatment, but without significant improvement. Currently she has productive cough with greenish colored sputum production. Patient has chest tightness, but no chest pain. Patient reports have chronic back pain. No abdominal pain, diarrhea, symptoms of UTI, unilateral weakness.  In ED, patient was found to have WBC 5.4, temperature 98.7, tachycardia, tachypnea, potassium 3.4, renal functioning okay. Chest x-ray has no infiltration, but showed stable elevation of her right hemidiaphragm. ABG showed pH 7.344, PCO2 46.2, PO2 224. Patient's  admitted to inpatient for further evaluation and treatment.  Hospital Course:  Acute respiratory failure with hypoxia (HCC) due to asthma exacerbation: Patient's cough, shortness of breath and wheezing are consistent with asthma exacerbation.  -flu A positive-- treated -Nebulizers: continue at home -Solu-Medrol wean to PO prednisone and taper -doxy -Mucinex for cough  -Urine S. pneumococcal antigen negative  Flu a + -tamiflu x 5 days  Sinus tachycardia: resolved  Hypothyroidism: Last TSH was 9.26 on 02/12/15. Her Synthroid dose was increased from 50 to 75 g daily -Continue home Synthroid  Depression: Stable, no suicidal or homicidal ideations. Not all medications sent home.  -Observe  CAD (coronary artery disease): No chest pain - On ASA, metoprolol and Lipitor  HLD: Last LDL was 55 on 02/12/15 -Continue home medications: Lipitor  HTN:  -Lotensin, HCTZ, metoprolol  Back pain: -When necessary Norco   Procedures:    Consultations:    Discharge Exam: Filed Vitals:   04/27/15 2102 04/28/15 0507  BP: 151/90 153/79  Pulse: 93 73  Temp: 98.6 F (37 C) 97.8 F (36.6 C)  Resp: 20 20    General: awake, walking the unit  Discharge Instructions   Discharge Instructions    Diet - low sodium heart healthy    Complete by:  As directed      Increase activity slowly    Complete by:  As directed           Current Discharge Medication List    START taking these medications   Details  dextromethorphan-guaiFENesin (MUCINEX DM) 30-600 MG 12hr tablet Take 1 tablet by mouth 2 (two) times daily. Qty: 10 tablet, Refills: 0    doxycycline (VIBRA-TABS) 100 MG  tablet Take 1 tablet (100 mg total) by mouth every 12 (twelve) hours. Qty: 6 tablet, Refills: 0    pantoprazole (PROTONIX) 40 MG tablet Take 1 tablet (40 mg total) by mouth daily. Qty: 10 tablet, Refills: 0    predniSONE (DELTASONE) 10 MG tablet 50 mg x 2 days, then 40 mg x 2 days, then 30 mg x2 days then  20 mg x 2 days then 10 mg x 2 days then d/c Qty: 30 tablet, Refills: 0      CONTINUE these medications which have NOT CHANGED   Details  albuterol (PROAIR HFA) 108 (90 BASE) MCG/ACT inhaler Inhale 1-2 puffs into the lungs every 6 (six) hours as needed. Qty: 1 Inhaler, Refills: 2   Associated Diagnoses: Chronic asthmatic bronchitis with acute exacerbation (HCC)    aspirin EC 325 MG tablet Take 1 tablet (325 mg total) by mouth daily. Qty: 30 tablet, Refills: 0    atorvastatin (LIPITOR) 40 MG tablet TAKE ONE TABLET BY MOUTH AT BEDTIME Qty: 90 tablet, Refills: 3    benazepril (LOTENSIN) 40 MG tablet Take 1/2 tablet by mouth daily Qty: 30 tablet, Refills: 5    Fluticasone Furoate-Vilanterol 100-25 MCG/INH AEPB Inhale 1 puff into the lungs daily. Qty: 60 each, Refills: 5   Associated Diagnoses: Asthma, mild intermittent, with acute exacerbation    gabapentin (NEURONTIN) 300 MG capsule Take 1 capsule (300 mg total) by mouth at bedtime. Qty: 30 capsule, Refills: 5    hydrochlorothiazide (MICROZIDE) 12.5 MG capsule Take 1 capsule (12.5 mg total) by mouth daily. Qty: 30 capsule, Refills: 2   Associated Diagnoses: Essential hypertension    HYDROcodone-acetaminophen (NORCO) 10-325 MG tablet Take 1 tablet by mouth every 6 (six) hours as needed. Qty: 100 tablet, Refills: 0    ipratropium-albuterol (DUONEB) 0.5-2.5 (3) MG/3ML SOLN Take 3 mLs by nebulization every 4 (four) hours as needed. Must not be qid unless during exacerbation. Dx 493.90 Qty: 360 mL, Refills: 1   Associated Diagnoses: Chronic asthmatic bronchitis with acute exacerbation (HCC)    levothyroxine (SYNTHROID, LEVOTHROID) 75 MCG tablet Take 1 tablet (75 mcg total) by mouth daily before breakfast. Qty: 30 tablet, Refills: 11    metoprolol (LOPRESSOR) 50 MG tablet 1 q 12 hrs Qty: 60 tablet, Refills: 5   Associated Diagnoses: Essential hypertension       Allergies  Allergen Reactions  . Fluticasone-Salmeterol Other (See  Comments)    REACTION: headache.  She is tolerating Dulera well.  [name] Norvasc [Amlodipine Besylate] Swelling and Other (See Comments)    edema  . Tape Other (See Comments)    Prefers paper tape   Follow-up Information    Follow up with Binnie Rail, MD In 1 week.   Specialty:  Internal Medicine   Why:  patient would benefit from referral to [facility] pulm-- Dr. Lake Bells prefered    Contact information:   [place] [id number] [phone]        The results of significant diagnostics from this hospitalization (including imaging, microbiology, ancillary and laboratory) are listed below for reference.    Significant Diagnostic Studies: Dg Chest Port 1 View  04/22/2015  CLINICAL DATA:  Shortness of Breath.  History of asthma and COPD. EXAM: PORTABLE CHEST 1 VIEW COMPARISON:  10/23/2013 FINDINGS: Elevation of the right hemidiaphragm is stable. Mild cardiomegaly. No confluent airspace opacities or effusions. No acute bony abnormality. Moderate-sized hiatal hernia, stable. IMPRESSION: Stable elevation of the right hemidiaphragm. Hiatal hernia. No acute findings. Electronically Signed  By: Rolm Baptise M.D.   On: 04/22/2015 18:20    Microbiology: Recent Results (from the past 240 hour(s))  Culture, sputum-assessment     Status: None   Collection Time: 04/22/15  7:48 PM  Result Value Ref Range Status   Specimen Description SPUTUM  Final   Special Requests NONE  Final   Sputum evaluation   Final    MICROSCOPIC FINDINGS SUGGEST THAT THIS SPECIMEN IS NOT REPRESENTATIVE OF LOWER RESPIRATORY SECRETIONS. PLEASE RECOLLECT. Gram Stain Report Called to,Read Back By and Verified With: S VIVERITO,RN AT 1147 04/23/15 BY L BENFIELD    Report Status 04/23/2015 FINAL  Final  Culture, blood (routine x 2) Call MD if unable to obtain prior to antibiotics being given     Status: None   Collection Time: 04/22/15  8:06 PM  Result Value Ref Range Status   Specimen Description BLOOD LEFT ARM  Final    Special Requests IN PEDIATRIC BOTTLE [place]  Final   Culture NO GROWTH 5 DAYS  Final   Report Status 04/27/2015 FINAL  Final  Culture, blood (routine x 2) Call MD if unable to obtain prior to antibiotics being given     Status: None   Collection Time: 04/22/15  8:08 PM  Result Value Ref Range Status   Specimen Description BLOOD RIGHT WRIST  Final   Special Requests BOTTLES DRAWN AEROBIC AND ANAEROBIC 5CC  Final   Culture NO GROWTH 5 DAYS  Final   Report Status 04/27/2015 FINAL  Final  MRSA PCR Screening     Status: None   Collection Time: 04/22/15  9:23 PM  Result Value Ref Range Status   MRSA by PCR NEGATIVE NEGATIVE Final    Comment:        The GeneXpert MRSA Assay (FDA approved for NASAL specimens only), is one component of a comprehensive MRSA colonization surveillance program. It is not intended to diagnose MRSA infection nor to guide or monitor treatment for MRSA infections.      Labs: Basic Metabolic Panel:  Recent Labs Lab 04/22/15 1804 04/25/15 0639  NA 141 142  K 3.4* 4.9  CL 105 100*  CO2 21* 31  GLUCOSE 273* 124*  BUN 9 20  CREATININE 0.85 0.45  CALCIUM 9.4 9.3   Liver Function Tests: No results for input(s): AST, ALT, ALKPHOS, BILITOT, PROT, ALBUMIN in the last 168 hours. No results for input(s): LIPASE, AMYLASE in the last 168 hours. No results for input(s): AMMONIA in the last 168 hours. CBC:  Recent Labs Lab 04/22/15 1804 04/25/15 0639  WBC 5.4 6.2  HGB 11.8* 11.8*  HCT 38.5 39.2  MCV 91.7 92.9  PLT 228 259   Cardiac Enzymes:  Recent Labs Lab 04/22/15 1804  TROPONINI <0.03   BNP: BNP (last 3 results) No results for input(s): BNP in the last 8760 hours.  ProBNP (last 3 results) No results for input(s): PROBNP in the last 8760 hours.  CBG: No results for input(s): GLUCAP in the last 168 hours.     Signed:  Geradine Girt DO  [facility] 04/28/2015, 9:32 AM

## 2015-04-29 ENCOUNTER — Telehealth: Payer: Self-pay | Admitting: *Deleted

## 2015-04-29 NOTE — Telephone Encounter (Signed)
Transition Care Management Follow-up Telephone Call   Date discharged? 04/28/15   How have you been since you were released from the hospital? Pt states she is doing fine jus a little weak   Do you understand why you were in the hospital? YES   Do you understand the discharge instructions? YES   Where were you discharged to? Home   Items Reviewed:  Medications reviewed: YES  Allergies reviewed: YES  Dietary changes reviewed: YES  Referrals reviewed: YES, pt states she hasn't receive call with appt for pulmonologist yet   Functional Questionnaire:   Activities of Daily Living (ADLs):   She states she are independent in the following: ambulation, bathing and hygiene, feeding, continence, grooming, toileting and dressing States she doesn't require assistance   Any transportation issues/concerns?: NO   Any patient concerns? NO   Confirmed importance and date/time of follow-up visits scheduled YES, made appt 04/29/15  Provider Appointment booked with Dr. Billey Gosling  Confirmed with patient if condition begins to worsen call PCP or go to the ER.  Patient was given the office number and encouraged to call back with question or concerns.  : YES

## 2015-05-06 ENCOUNTER — Ambulatory Visit (INDEPENDENT_AMBULATORY_CARE_PROVIDER_SITE_OTHER): Payer: Medicare Other | Admitting: Internal Medicine

## 2015-05-06 ENCOUNTER — Encounter: Payer: Self-pay | Admitting: Internal Medicine

## 2015-05-06 VITALS — BP 138/80 | HR 67 | Temp 97.8°F | Resp 18 | Wt 165.0 lb

## 2015-05-06 DIAGNOSIS — J9601 Acute respiratory failure with hypoxia: Secondary | ICD-10-CM | POA: Diagnosis not present

## 2015-05-06 DIAGNOSIS — Z23 Encounter for immunization: Secondary | ICD-10-CM

## 2015-05-06 DIAGNOSIS — J45901 Unspecified asthma with (acute) exacerbation: Secondary | ICD-10-CM

## 2015-05-06 DIAGNOSIS — J4521 Mild intermittent asthma with (acute) exacerbation: Secondary | ICD-10-CM

## 2015-05-06 MED ORDER — FLUTICASONE FUROATE-VILANTEROL 100-25 MCG/INH IN AEPB: 1.0000 | INHALATION_SPRAY | Freq: Every day | RESPIRATORY_TRACT | Status: DC

## 2015-05-06 NOTE — Progress Notes (Signed)
Pre visit review using our clinic review tool, if applicable. No additional management support is needed unless otherwise documented below in the visit note. 

## 2015-05-06 NOTE — Patient Instructions (Addendum)
Prevnar vaccine administered today.   Medications reviewed and updated. No changes recommended at this time.  Your prescription(s) have been submitted to your pharmacy. Please take as directed and contact our office if you believe you are having problem(s) with the medication(s).  A referral was ordered for pulmonary

## 2015-05-06 NOTE — Progress Notes (Signed)
Subjective:    Patient ID: Hannah Scott, female    DOB: 1950-09-21, 65 y.o.   MRN: [id number]  HPI She is here for follow-up from her recent hospitalization. She was admitted 12/28 and discharged 1/3.  She presented to the emergency room with several days of cough that was productive, she had shortness of breath and tightness in her chest. She had gone to urgent care that day and received nebulizer treatments without much improvement. She was not experiencing chest pain. She was sent to the ED and admitted with acute respiratory failure with hypoxia related to an asthma exacerbation. Her flu A was positive and she was treated with Tamiflu for 5 days. She was placed on antibiotics and Solu-Medrol. She received nebulizer treatments and Mucinex. She had sinus tachycardia when she was admitted and that resolved during her hospitalization. She was discharged home on a prednisone taper, doxycycline, a short course of pantoprazole and Mucinex DM. Her other medications were continued without change.  She is almost done with the prednisone and has finished the antibiotics.  She did hear some wheezing yesterday and has had a little tightness.  She did use her nebulizer and it helped.  She has a mild cough and is not bringing up much phlegm.  She denies fever.  She is out of her breo and has not used it since being out of the hospital - she needs a refill.    She was recommended to see a pulmonologist when she was in the hospital.  She also is due for the prevnar.    Medications and allergies reviewed with patient and updated if appropriate.  Patient Active Problem List   Diagnosis Date Noted  . Influenza A 04/23/2015  . Acute respiratory failure with hypoxia ([place]) 04/22/2015  . Status asthmaticus 04/22/2015  . Back pain 04/22/2015  . Acute respiratory failure with hypercapnia ([place])   . Lumbar scoliosis 01/10/2014  . Hypocalcemia 01/03/2014  . Anemia due to blood loss 11/27/2013  . Edema 11/27/2013    . Elevated CPK   . D-dimer, elevated   . Multinodular goiter 12/01/2011  . Asthma exacerbation   . Hypothyroidism   . CAD (coronary artery disease)   . Ejection fraction   . Atrial septal aneurysm   . Hyperlipidemia   . Hypertension   . Shortness of breath   . Scoliosis   . Spinal stenosis, lumbar region, with neurogenic claudication 07/01/2011  . Lumbar postlaminectomy syndrome 07/01/2011  . Osteoarthritis of right hip 07/01/2011  . Contracture of right hip 07/01/2011  . Endometriosis   . Depression 12/24/2009  . FATIGUE 12/24/2009  . Migraine headache 05/19/2006    Current Outpatient Prescriptions on File Prior to Visit  Medication Sig Dispense Refill  . albuterol (PROAIR HFA) 108 (90 BASE) MCG/ACT inhaler Inhale 1-2 puffs into the lungs every 6 (six) hours as needed. 1 Inhaler 2  . aspirin EC 325 MG tablet Take 1 tablet (325 mg total) by mouth daily. 30 tablet 0  . atorvastatin (LIPITOR) 40 MG tablet TAKE ONE TABLET BY MOUTH AT BEDTIME 90 tablet 3  . benazepril (LOTENSIN) 40 MG tablet Take 1/2 tablet by mouth daily (Patient taking differently: Take 20 mg by mouth daily. Take 1/2 tablet by mouth daily) 30 tablet 5  . dextromethorphan-guaiFENesin (MUCINEX DM) 30-600 MG 12hr tablet Take 1 tablet by mouth 2 (two) times daily. 10 tablet 0  . Fluticasone Furoate-Vilanterol 100-25 MCG/INH AEPB Inhale 1 puff into the lungs daily. [place]  each 5  . gabapentin (NEURONTIN) 300 MG capsule Take 1 capsule (300 mg total) by mouth at bedtime. 30 capsule 5  . hydrochlorothiazide (MICROZIDE) 12.5 MG capsule Take 1 capsule (12.5 mg total) by mouth daily. (Patient taking differently: Take 12.5 mg by mouth daily as needed (for fluid). ) 30 capsule 2  . HYDROcodone-acetaminophen (NORCO) 10-325 MG tablet Take 1 tablet by mouth every 6 (six) hours as needed. (Patient taking differently: Take 1 tablet by mouth 3 (three) times daily. ) 100 tablet 0  . ipratropium-albuterol (DUONEB) 0.5-2.5 (3) MG/3ML SOLN  Take 3 mLs by nebulization every 4 (four) hours as needed. Must not be qid unless during exacerbation. Dx 493.90 360 mL 1  . levothyroxine (SYNTHROID, LEVOTHROID) 75 MCG tablet Take 1 tablet (75 mcg total) by mouth daily before breakfast. 30 tablet 11  . metoprolol (LOPRESSOR) 50 MG tablet 1 q 12 hrs (Patient taking differently: Take 50 mg by mouth 2 (two) times daily. 1 q 12 hrs) 60 tablet 5  . pantoprazole (PROTONIX) 40 MG tablet Take 1 tablet (40 mg total) by mouth daily. 10 tablet 0  . predniSONE (DELTASONE) 10 MG tablet 50 mg x 2 days, then 40 mg x 2 days, then 30 mg x2 days then 20 mg x 2 days then 10 mg x 2 days then d/c 30 tablet 0   No current facility-administered medications on file prior to visit.    Past Medical History  Diagnosis Date  . Asthma     PFTs, February, 2011, moderate obstructive disease with response to bronchodilators, normal lung volumes, moderate reduction in diffusing capacity  . Obstructive airway disease ([place])   . Hyperlipidemia   . Hypertension   . Hypothyroidism     Patient has had in the past that she does not need treatment  . CAD (coronary artery disease)     90% distal LAD in the past  /   nuclear, 2008, no ischemia, ejection fraction 70%  . Atrial septal aneurysm     Echo, 2008-not noted on 13 echo  . Pinched nerve     lower back  . Kyphoscoliosis   . Shortness of breath   . Endometriosis 1989    RIGHT TUBE  . Endometriosis 1987    LEFT TUBE/OVARY W FOCAL IN-SITU ENDOMETRIAL ADENOCARCINOMA  . Cancer ([place]) 2002    DUCTAL CIS--S/P LUMPECTOMY, RADIATION AND 6 WEEKS OF TAMOXIFEN  . Ejection fraction     EF 60%, echo, October, 2008  . Elevated CPK     January, 2014  . D-dimer, elevated     January, 2014  . Depression   . UTI (lower urinary tract infection)   . GERD (gastroesophageal reflux disease)     occ  . H/O hiatal hernia     ?  [name] Arthritis   . Anemia     hx    Past Surgical History  Procedure Laterality Date  . Cataract  extraction Bilateral 01,03  . Cardiovascular stress test  09/07/05    Nuclear, was negative  . Breast lumpectomy  2003    radiation on right  . Appendectomy  1987    AT TAH  . Total abdominal hysterectomy  1987    LSO, APPENDECTOMY  . Pelvic laparoscopy  1989    RSO, LYSIS OF ADHESIONS  . Oophorectomy      LSO -RSO  . Back surgery      Fusion  . Ankle surgery Left     ligament  .  Total hip arthroplasty Right 10/28/2013    dr Lorin Mercy  . Total hip arthroplasty Right 10/28/2013    Procedure: TOTAL HIP ARTHROPLASTY ANTERIOR APPROACH;  Surgeon: Marybelle Killings, MD;  Location: [facility];  Service: Orthopedics;  Laterality: Right;  Right Total Hip Arthroplasty, Direct Anterior Approach    Social History   Social History  . Marital Status: Married    Spouse Name: N/A  . Number of Children: N/A  . Years of Education: N/A   Social History Main Topics  . Smoking status: Never Smoker   . Smokeless tobacco: Never Used  . Alcohol Use: No  . Drug Use: No  . Sexual Activity: No   Other Topics Concern  . None   Social History Narrative    Family History  Problem Relation Age of Onset  . Heart attack Father   . Heart disease Father   . Heart failure Mother   . Pneumonia Mother   . Hypertension Mother   . Heart disease Mother   . Stroke Maternal Grandfather   . Hypertension Maternal Grandfather   . Asthma Paternal Uncle     PAT UNCLES  . Heart attack Paternal Uncle     Review of Systems  Constitutional: Positive for fatigue. Negative for fever.       Appetite improved  HENT: Positive for postnasal drip. Negative for congestion and sore throat.   Respiratory: Positive for cough, chest tightness, shortness of breath (little) and wheezing.   Cardiovascular: Negative for chest pain, palpitations and leg swelling.  Neurological: Positive for light-headedness (occasional). Negative for dizziness and headaches.       Objective:   Filed Vitals:   05/06/15 0920  BP: 138/80  Pulse: 67   Temp: 97.8 F (36.6 C)  Resp: 18   Filed Weights   05/06/15 0920  Weight: 165 lb (74.844 kg)   Body mass index is 32.22 kg/(m^2).   Physical Exam  Constitutional: She appears well-developed and well-nourished. No distress.  HENT:  Head: Normocephalic and atraumatic.  Right Ear: External ear normal.  Left Ear: External ear normal.  Mouth/Throat: Oropharynx is clear and moist. No oropharyngeal exudate.  Bilateral ear canals and tympanic membranes normal  Neck: Neck supple. No tracheal deviation present. No thyromegaly present.  Cardiovascular: Normal rate, regular rhythm and normal heart sounds.   Pulmonary/Chest: Effort normal. No respiratory distress. She has wheezes (mild, expiration only, not diffuse). She has no rales.  Musculoskeletal: She exhibits no edema.  Lymphadenopathy:    She has no cervical adenopathy.  Skin: She is not diaphoretic.         Assessment & Plan:   Follow up from hospital --   Asthma exacerbation, acute respiratory failure with hypoxia, influenza A Reviewed hospital course, imagine and tests Overall doing well Symptoms much improved, but still having some mild wheeze and tightness that improves with neb treatment Complete prednisone taper Neb treatments as needed Restart Breo Will refer to pulm  prevnar today  She will monitor her wheeze, chest tightness closely and if it worsens or does not resolve she will call - may need additional steroids.

## 2015-05-14 ENCOUNTER — Encounter: Payer: Self-pay | Admitting: Endocrinology

## 2015-05-14 ENCOUNTER — Ambulatory Visit (INDEPENDENT_AMBULATORY_CARE_PROVIDER_SITE_OTHER): Payer: Medicare Other | Admitting: Endocrinology

## 2015-05-14 VITALS — BP 148/84 | HR 80 | Temp 97.4°F | Resp 14 | Wt 161.0 lb

## 2015-05-14 DIAGNOSIS — E039 Hypothyroidism, unspecified: Secondary | ICD-10-CM

## 2015-05-14 DIAGNOSIS — R5383 Other fatigue: Secondary | ICD-10-CM | POA: Diagnosis not present

## 2015-05-14 DIAGNOSIS — R5381 Other malaise: Secondary | ICD-10-CM | POA: Diagnosis not present

## 2015-05-14 LAB — GLUCOSE, POCT (MANUAL RESULT ENTRY): POC Glucose: 122 mg/dl — AB (ref 70–99)

## 2015-05-14 LAB — TSH: TSH: 2.89 u[IU]/mL (ref 0.35–4.50)

## 2015-05-14 NOTE — Patient Instructions (Signed)
blood tests are requested for you today.  We'll let you know about the results. Please come back for a follow-up appointment in 6 months.    Most of the time, your thyroid will start working more or less than it is now.  This means that your need for the levothyroxine will go up or down with time, bult seldom stays the same.    

## 2015-05-14 NOTE — Progress Notes (Signed)
Subjective:    Patient ID: Hannah Scott, female    DOB: Jun 05, 1950, 65 y.o.   MRN: [id number]  HPI Pt returns for f/u of post-I-131 hypothyroidism (she had i-131 rx for hyperthyroidism, due to multinodular goiter, in September, 2013; in early 2014, TSH was high, and synthroid was started; f/u US in late 2015 showed the goiter was smaller). She does not notice the goiter.  pt reports fatigue.  Past Medical History  Diagnosis Date  . Asthma     PFTs, February, 2011, moderate obstructive disease with response to bronchodilators, normal lung volumes, moderate reduction in diffusing capacity  . Obstructive airway disease ([place])   . Hyperlipidemia   . Hypertension   . Hypothyroidism     Patient has had in the past that she does not need treatment  . CAD (coronary artery disease)     90% distal LAD in the past  /   nuclear, 2008, no ischemia, ejection fraction 70%  . Atrial septal aneurysm     Echo, 2008-not noted on 13 echo  . Pinched nerve     lower back  . Kyphoscoliosis   . Shortness of breath   . Endometriosis 1989    RIGHT TUBE  . Endometriosis 1987    LEFT TUBE/OVARY W FOCAL IN-SITU ENDOMETRIAL ADENOCARCINOMA  . Cancer ([place]) 2002    DUCTAL CIS--S/P LUMPECTOMY, RADIATION AND 6 WEEKS OF TAMOXIFEN  . Ejection fraction     EF 60%, echo, October, 2008  . Elevated CPK     January, 2014  . D-dimer, elevated     January, 2014  . Depression   . UTI (lower urinary tract infection)   . GERD (gastroesophageal reflux disease)     occ  . H/O hiatal hernia     ?  [name] Arthritis   . Anemia     hx    Past Surgical History  Procedure Laterality Date  . Cataract extraction Bilateral 01,03  . Cardiovascular stress test  09/07/05    Nuclear, was negative  . Breast lumpectomy  2003    radiation on right  . Appendectomy  1987    AT [facility]  . Total abdominal hysterectomy  1987    LSO, APPENDECTOMY  . Pelvic laparoscopy  1989    RSO, LYSIS OF ADHESIONS  . Oophorectomy      LSO -RSO  .  Back surgery      Fusion  . Ankle surgery Left     ligament  . Total hip arthroplasty Right 10/28/2013    dr Lorin Mercy  . Total hip arthroplasty Right 10/28/2013    Procedure: TOTAL HIP ARTHROPLASTY ANTERIOR APPROACH;  Surgeon: Marybelle Killings, MD;  Location: [facility];  Service: Orthopedics;  Laterality: Right;  Right Total Hip Arthroplasty, Direct Anterior Approach    Social History   Social History  . Marital Status: Married    Spouse Name: N/A  . Number of Children: N/A  . Years of Education: N/A   Occupational History  . Not on file.   Social History Main Topics  . Smoking status: Never Smoker   . Smokeless tobacco: Never Used  . Alcohol Use: No  . Drug Use: No  . Sexual Activity: No   Other Topics Concern  . Not on file   Social History Narrative    Current Outpatient Prescriptions on File Prior to Visit  Medication Sig Dispense Refill  . albuterol (PROAIR HFA) 108 (90 BASE) MCG/ACT inhaler Inhale 1-2 puffs into the  lungs every 6 (six) hours as needed. 1 Inhaler 2  . aspirin EC 325 MG tablet Take 1 tablet (325 mg total) by mouth daily. 30 tablet 0  . atorvastatin (LIPITOR) 40 MG tablet TAKE ONE TABLET BY MOUTH AT BEDTIME 90 tablet 3  . benazepril (LOTENSIN) 40 MG tablet Take 1/2 tablet by mouth daily (Patient taking differently: Take 20 mg by mouth daily. Take 1/2 tablet by mouth daily) 30 tablet 5  . Fluticasone Furoate-Vilanterol 100-25 MCG/INH AEPB Inhale 1 puff into the lungs daily. 60 each 5  . gabapentin (NEURONTIN) 300 MG capsule Take 1 capsule (300 mg total) by mouth at bedtime. 30 capsule 5  . hydrochlorothiazide (MICROZIDE) 12.5 MG capsule Take 1 capsule (12.5 mg total) by mouth daily. (Patient taking differently: Take 12.5 mg by mouth daily as needed (for fluid). ) 30 capsule 2  . HYDROcodone-acetaminophen (NORCO) 10-325 MG tablet Take 1 tablet by mouth every 6 (six) hours as needed. (Patient taking differently: Take 1 tablet by mouth 3 (three) times daily. ) 100 tablet  0  . ipratropium-albuterol (DUONEB) 0.5-2.5 (3) MG/3ML SOLN Take 3 mLs by nebulization every 4 (four) hours as needed. Must not be qid unless during exacerbation. Dx 493.90 360 mL 1  . levothyroxine (SYNTHROID, LEVOTHROID) 75 MCG tablet Take 1 tablet (75 mcg total) by mouth daily before breakfast. 30 tablet 11  . metoprolol (LOPRESSOR) 50 MG tablet 1 q 12 hrs (Patient taking differently: Take 50 mg by mouth 2 (two) times daily. 1 q 12 hrs) 60 tablet 5   No current facility-administered medications on file prior to visit.    Allergies  Allergen Reactions  . Fluticasone-Salmeterol Other (See Comments)    REACTION: headache.  She is tolerating Dulera well.  [name] Norvasc [Amlodipine Besylate] Swelling and Other (See Comments)    edema  . Tape Other (See Comments)    Prefers paper tape    Family History  Problem Relation Age of Onset  . Heart attack Father   . Heart disease Father   . Heart failure Mother   . Pneumonia Mother   . Hypertension Mother   . Heart disease Mother   . Stroke Maternal Grandfather   . Hypertension Maternal Grandfather   . Asthma Paternal Uncle     PAT UNCLES  . Heart attack Paternal Uncle     BP 148/84 mmHg  Pulse 80  Temp(Src) 97.4 F (36.3 C) (Oral)  Resp 14  Wt 161 lb (73.029 kg)  SpO2 95%  Review of Systems Denies weight change    Objective:   Physical Exam VITAL SIGNS:  See vs page.  GENERAL: no distress. NECK: There is no palpable thyroid enlargement.  No thyroid nodule is palpable.  No palpable lymphadenopathy at the anterior neck.   Lab Results  Component Value Date   TSH 2.89 05/14/2015   Cbg=123 (pt was recently on steroids)    Assessment & Plan:  post-I-131 hypothyroidism. Well-replaced. Fatigue: not explained by hyperglycemia.  Patient is advised the following: Patient Instructions  blood tests are requested for you today.  We'll let you know about the results.   Please come back for a follow-up appointment in 6 months.      Most of the time, your thyroid will start working more or less than it is now.  This means that your need for the levothyroxine will go up or down with time, bult seldom stays the same.

## 2015-05-18 ENCOUNTER — Encounter: Payer: Medicare Other | Admitting: Registered Nurse

## 2015-05-20 ENCOUNTER — Ambulatory Visit (INDEPENDENT_AMBULATORY_CARE_PROVIDER_SITE_OTHER): Payer: Medicare Other | Admitting: Internal Medicine

## 2015-05-20 ENCOUNTER — Encounter: Payer: Self-pay | Admitting: Internal Medicine

## 2015-05-20 VITALS — BP 136/76 | HR 82 | Temp 98.3°F | Resp 18

## 2015-05-20 DIAGNOSIS — R5381 Other malaise: Secondary | ICD-10-CM | POA: Insufficient documentation

## 2015-05-20 DIAGNOSIS — F329 Major depressive disorder, single episode, unspecified: Secondary | ICD-10-CM

## 2015-05-20 DIAGNOSIS — R5383 Other fatigue: Secondary | ICD-10-CM | POA: Diagnosis not present

## 2015-05-20 DIAGNOSIS — F32A Depression, unspecified: Secondary | ICD-10-CM

## 2015-05-20 MED ORDER — DULOXETINE HCL 20 MG PO CPEP: 20.0000 mg | ORAL_CAPSULE | Freq: Every day | ORAL | Status: DC

## 2015-05-20 NOTE — Progress Notes (Signed)
Subjective:    Patient ID: Hannah Scott, female    DOB: [birth year], 65 y.o.   MRN: [id number]  HPI I saw her two weeks ago after her hospitalization, and comes in today complaining of lack of energy that is getting worse.   She has no energy. Probably started in the fall and it is getting worse.  She just wants to sleep.  She is still getting SOB.  She has an appt with cardiology and pulmonary.  She just had her thyroid rechecked and it is not the thyroid.  She is tired of having no energy and just wants to feel better.  Difficulty sleeping: Sleep has been bad for a long time.  Sleeps a couple of hours and then wakes up.  Then gets another couple of hours.  This is chronic and there has been no change.  She thinks she gets 4-5 hours of hours.  She wakes up tired and stays tired.  Her back pain and swelling typically wakes her up.  She does have shortness of breath and maybe slightly worse than her baseline, bypass is not that has improved since the hospital. She denies any chest pain, coughing, wheezing.  ? Depression: She states she could possibly be depressed. She does not do much outside of her house except church.  She talks or sees her kids often.  No activities she finds pleasure in - can't move around much.  She cries a lot.    Back pain, leg pain: Her pain medication helps, but does not typically all of the pain. The gabapentin helps at night with her leg pain.  Her orthopedic recommended increasing the gabapentin, but she did not want to take more than what she was taking.  Medications and allergies reviewed with patient and updated if appropriate.  Patient Active Problem List   Diagnosis Date Noted  . Influenza A 04/23/2015  . Acute respiratory failure with hypoxia ([place]) 04/22/2015  . Status asthmaticus 04/22/2015  . Back pain 04/22/2015  . Acute respiratory failure with hypercapnia ([place])   . Lumbar scoliosis 01/10/2014  . Hypocalcemia 01/03/2014  . Anemia due to blood loss  11/27/2013  . Edema 11/27/2013  . Elevated CPK   . D-dimer, elevated   . Multinodular goiter 12/01/2011  . Asthma exacerbation   . Hypothyroidism   . CAD (coronary artery disease)   . Ejection fraction   . Atrial septal aneurysm   . Hyperlipidemia   . Hypertension   . Shortness of breath   . Scoliosis   . Spinal stenosis, lumbar region, with neurogenic claudication 07/01/2011  . Lumbar postlaminectomy syndrome 07/01/2011  . Osteoarthritis of right hip 07/01/2011  . Contracture of right hip 07/01/2011  . Endometriosis   . Depression 12/24/2009  . FATIGUE 12/24/2009  . Migraine headache 05/19/2006    Current Outpatient Prescriptions on File Prior to Visit  Medication Sig Dispense Refill  . albuterol (PROAIR HFA) 108 (90 BASE) MCG/ACT inhaler Inhale 1-2 puffs into the lungs every 6 (six) hours as needed. 1 Inhaler 2  . aspirin EC 325 MG tablet Take 1 tablet (325 mg total) by mouth daily. 30 tablet 0  . atorvastatin (LIPITOR) 40 MG tablet TAKE ONE TABLET BY MOUTH AT BEDTIME 90 tablet 3  . benazepril (LOTENSIN) 40 MG tablet Take 1/2 tablet by mouth daily (Patient taking differently: Take 20 mg by mouth daily. Take 1/2 tablet by mouth daily) 30 tablet 5  . Fluticasone Furoate-Vilanterol 100-25 MCG/INH AEPB Inhale 1  puff into the lungs daily. 60 each 5  . gabapentin (NEURONTIN) 300 MG capsule Take 1 capsule (300 mg total) by mouth at bedtime. 30 capsule 5  . hydrochlorothiazide (MICROZIDE) 12.5 MG capsule Take 1 capsule (12.5 mg total) by mouth daily. (Patient taking differently: Take 12.5 mg by mouth daily as needed (for fluid). ) 30 capsule 2  . HYDROcodone-acetaminophen (NORCO) 10-325 MG tablet Take 1 tablet by mouth every 6 (six) hours as needed. (Patient taking differently: Take 1 tablet by mouth 3 (three) times daily. ) 100 tablet 0  . ipratropium-albuterol (DUONEB) 0.5-2.5 (3) MG/3ML SOLN Take 3 mLs by nebulization every 4 (four) hours as needed. Must not be qid unless during  exacerbation. Dx 493.90 360 mL 1  . levothyroxine (SYNTHROID, LEVOTHROID) 75 MCG tablet Take 1 tablet (75 mcg total) by mouth daily before breakfast. 30 tablet 11  . metoprolol (LOPRESSOR) 50 MG tablet 1 q 12 hrs (Patient taking differently: Take 50 mg by mouth 2 (two) times daily. 1 q 12 hrs) 60 tablet 5   No current facility-administered medications on file prior to visit.    Past Medical History  Diagnosis Date  . Asthma     PFTs, February, 2011, moderate obstructive disease with response to bronchodilators, normal lung volumes, moderate reduction in diffusing capacity  . Obstructive airway disease ([place])   . Hyperlipidemia   . Hypertension   . Hypothyroidism     Patient has had in the past that she does not need treatment  . CAD (coronary artery disease)     90% distal LAD in the past  /   nuclear, 2008, no ischemia, ejection fraction 70%  . Atrial septal aneurysm     Echo, 2008-not noted on 13 echo  . Pinched nerve     lower back  . Kyphoscoliosis   . Shortness of breath   . Endometriosis 1989    RIGHT TUBE  . Endometriosis 1987    LEFT TUBE/OVARY W FOCAL IN-SITU ENDOMETRIAL ADENOCARCINOMA  . Cancer ([place]) 2002    DUCTAL CIS--S/P LUMPECTOMY, RADIATION AND 6 WEEKS OF TAMOXIFEN  . Ejection fraction     EF 60%, echo, October, 2008  . Elevated CPK     January, 2014  . D-dimer, elevated     January, 2014  . Depression   . UTI (lower urinary tract infection)   . GERD (gastroesophageal reflux disease)     occ  . H/O hiatal hernia     ?  [name] Arthritis   . Anemia     hx    Past Surgical History  Procedure Laterality Date  . Cataract extraction Bilateral 01,03  . Cardiovascular stress test  09/07/05    Nuclear, was negative  . Breast lumpectomy  2003    radiation on right  . Appendectomy  1987    AT TAH  . Total abdominal hysterectomy  1987    LSO, APPENDECTOMY  . Pelvic laparoscopy  1989    RSO, LYSIS OF ADHESIONS  . Oophorectomy      LSO -RSO  . Back surgery       Fusion  . Ankle surgery Left     ligament  . Total hip arthroplasty Right 10/28/2013    dr Lorin Mercy  . Total hip arthroplasty Right 10/28/2013    Procedure: TOTAL HIP ARTHROPLASTY ANTERIOR APPROACH;  Surgeon: Marybelle Killings, MD;  Location: [facility];  Service: Orthopedics;  Laterality: Right;  Right Total Hip Arthroplasty, Direct Anterior Approach  Social History   Social History  . Marital Status: Married    Spouse Name: N/A  . Number of Children: N/A  . Years of Education: N/A   Social History Main Topics  . Smoking status: Never Smoker   . Smokeless tobacco: Never Used  . Alcohol Use: No  . Drug Use: No  . Sexual Activity: No   Other Topics Concern  . Not on file   Social History Narrative    Family History  Problem Relation Age of Onset  . Heart attack Father   . Heart disease Father   . Heart failure Mother   . Pneumonia Mother   . Hypertension Mother   . Heart disease Mother   . Stroke Maternal Grandfather   . Hypertension Maternal Grandfather   . Asthma Paternal Uncle     PAT UNCLES  . Heart attack Paternal Uncle     Review of Systems  Constitutional: Positive for appetite change (decreased, nothing tastes good) and fatigue. Negative for fever, chills and unexpected weight change.  HENT: Negative for congestion, ear pain and sore throat.   Respiratory: Positive for cough (minimal, dry), shortness of breath (with moderate exertion - worse than baseline) and wheezing (little, occasional).   Cardiovascular: Positive for palpitations (occasional). Negative for chest pain and leg swelling.  Gastrointestinal: Negative for nausea, abdominal pain, diarrhea, constipation and blood in stool.       No GERD  Genitourinary: Negative for dysuria and hematuria.  Neurological: Positive for light-headedness (occasional with fast heart rate). Negative for dizziness and headaches.  Psychiatric/Behavioral: Positive for sleep disturbance and dysphoric mood.       Objective:    Filed Vitals:   05/20/15 1613  BP: 136/76  Pulse: 82  Temp: 98.3 F (36.8 C)  Resp: 18   There were no vitals filed for this visit. There is no weight on file to calculate BMI.   Physical Exam  Constitutional: She appears well-developed and well-nourished.  Neck: Neck supple. No tracheal deviation present. No thyromegaly present.  Cardiovascular: Normal rate, regular rhythm and normal heart sounds.   Pulmonary/Chest: Effort normal. No respiratory distress. She has wheezes (a couple of very mild wh with expiration). She has rales (Dry crackles at bases, mild).  Abdominal: Soft. She exhibits no distension. There is no tenderness.  Musculoskeletal: She exhibits no edema.  Lymphadenopathy:    She has no cervical adenopathy.  Psychiatric:  Depressed affect       Assessment & Plan:   See Problem List for Assessment and Plan of chronic medical problems.  Follow-up in 4 weeks, sooner if needed

## 2015-05-20 NOTE — Patient Instructions (Addendum)
We will start cymbalta 20 mg daily.  It was sent to your pharmacy.  Take as prescribed and call if you have any side effects or questions.    Follow up with me in 4 weeks, sooner if needed.

## 2015-05-20 NOTE — Assessment & Plan Note (Signed)
She has a history of depression and most likely she has depression now. This is likely contributing to her decreased energy Discussed trying Cymbalta, which may also help with some of her chronic pain. She was on sertraline in the past and it did not seem to help Start  Cymbalta 20 mg daily Discussed possible side effects-call if she experiences any or discontinue medication Follow-up in 4 weeks, sooner if needed

## 2015-05-20 NOTE — Progress Notes (Signed)
Pre visit review using our clinic review tool, if applicable. No additional management support is needed unless otherwise documented below in the visit note. 

## 2015-05-20 NOTE — Assessment & Plan Note (Signed)
Her fatigue and lack of energy is likely multifactorial Recent blood work within the past couple of months reveals no obvious cause Depression will be treated with Cymbalta She will see the pulmonologist and cardiologist rule out cardiac or pulmonary causes Her chronic pain and poor sleep is likely contributing as well. Advised her to consider increasing the gabapentin at night to see if that helps with both Follow-up in 4 weeks

## 2015-05-22 ENCOUNTER — Encounter: Payer: Medicare Other | Attending: Physical Medicine & Rehabilitation | Admitting: Registered Nurse

## 2015-05-22 ENCOUNTER — Encounter: Payer: Self-pay | Admitting: Registered Nurse

## 2015-05-22 VITALS — BP 133/52 | HR 73 | Resp 14

## 2015-05-22 DIAGNOSIS — G894 Chronic pain syndrome: Secondary | ICD-10-CM | POA: Diagnosis not present

## 2015-05-22 DIAGNOSIS — Z79899 Other long term (current) drug therapy: Secondary | ICD-10-CM | POA: Diagnosis not present

## 2015-05-22 DIAGNOSIS — M1611 Unilateral primary osteoarthritis, right hip: Secondary | ICD-10-CM | POA: Diagnosis present

## 2015-05-22 DIAGNOSIS — M961 Postlaminectomy syndrome, not elsewhere classified: Secondary | ICD-10-CM

## 2015-05-22 DIAGNOSIS — M419 Scoliosis, unspecified: Secondary | ICD-10-CM

## 2015-05-22 DIAGNOSIS — Z5181 Encounter for therapeutic drug level monitoring: Secondary | ICD-10-CM

## 2015-05-22 DIAGNOSIS — M4126 Other idiopathic scoliosis, lumbar region: Secondary | ICD-10-CM | POA: Insufficient documentation

## 2015-05-22 DIAGNOSIS — M48062 Spinal stenosis, lumbar region with neurogenic claudication: Secondary | ICD-10-CM

## 2015-05-22 DIAGNOSIS — M24551 Contracture, right hip: Secondary | ICD-10-CM

## 2015-05-22 DIAGNOSIS — M4806 Spinal stenosis, lumbar region: Secondary | ICD-10-CM | POA: Insufficient documentation

## 2015-05-22 MED ORDER — HYDROCODONE-ACETAMINOPHEN 10-325 MG PO TABS: 1.0000 | ORAL_TABLET | Freq: Four times a day (QID) | ORAL | Status: DC | PRN

## 2015-05-22 NOTE — Progress Notes (Signed)
Subjective:    Patient ID: Hannah Scott, female    DOB: 26-Apr-1950, 65 y.o.   MRN: [id number]  HPI: [name] is a 65 year old female who returns for follow up for chronic pain and medication refill. She says her pain is located in her lower back and right hip occassionally. She rates her pain 6. Her current exercise regime is walking for short distances using her cadillac walker also using her stationary bicycle. Hannah Scott was admitted at [facility] on 04/22/2015 and discharged  04/28/2015 for Asthma Exacerbation.  Pain Inventory Average Pain 7 Pain Right Now 6 My pain is constant, burning, tingling and aching  In the last 24 hours, has pain interfered with the following? General activity 10 Relation with others 10 Enjoyment of life 10 What TIME of day is your pain at its worst? daytime, evening, night  Sleep (in general) Poor  Pain is worse with: walking and standing Pain improves with: rest and medication Relief from Meds: 7  Mobility walk with assistance use a cane use a walker ability to climb steps?  yes do you drive?  yes  Function disabled: date disabled . I need assistance with the following:  household duties  Neuro/Psych weakness numbness tingling  Prior Studies Any changes since last visit?  no  Physicians involved in your care Any changes since last visit?  no   Family History  Problem Relation Age of Onset  . Heart attack Father   . Heart disease Father   . Heart failure Mother   . Pneumonia Mother   . Hypertension Mother   . Heart disease Mother   . Stroke Maternal Grandfather   . Hypertension Maternal Grandfather   . Asthma Paternal Uncle     PAT UNCLES  . Heart attack Paternal Uncle    Social History   Social History  . Marital Status: Married    Spouse Name: N/A  . Number of Children: N/A  . Years of Education: N/A   Social History Main Topics  . Smoking status: Never Smoker   . Smokeless tobacco: Never Used  . Alcohol  Use: No  . Drug Use: No  . Sexual Activity: No   Other Topics Concern  . None   Social History Narrative   Past Surgical History  Procedure Laterality Date  . Cataract extraction Bilateral 01,03  . Cardiovascular stress test  09/07/05    Nuclear, was negative  . Breast lumpectomy  2003    radiation on right  . Appendectomy  1987    AT TAH  . Total abdominal hysterectomy  1987    LSO, APPENDECTOMY  . Pelvic laparoscopy  1989    RSO, LYSIS OF ADHESIONS  . Oophorectomy      LSO -RSO  . Back surgery      Fusion  . Ankle surgery Left     ligament  . Total hip arthroplasty Right 10/28/2013    dr Lorin Mercy  . Total hip arthroplasty Right 10/28/2013    Procedure: TOTAL HIP ARTHROPLASTY ANTERIOR APPROACH;  Surgeon: Marybelle Killings, MD;  Location: [facility];  Service: Orthopedics;  Laterality: Right;  Right Total Hip Arthroplasty, Direct Anterior Approach   Past Medical History  Diagnosis Date  . Asthma     PFTs, February, 2011, moderate obstructive disease with response to bronchodilators, normal lung volumes, moderate reduction in diffusing capacity  . Obstructive airway disease ([place])   . Hyperlipidemia   . Hypertension   .  Hypothyroidism     Patient has had in the past that she does not need treatment  . CAD (coronary artery disease)     90% distal LAD in the past  /   nuclear, 2008, no ischemia, ejection fraction 70%  . Atrial septal aneurysm     Echo, 2008-not noted on 13 echo  . Pinched nerve     lower back  . Kyphoscoliosis   . Shortness of breath   . Endometriosis 1989    RIGHT TUBE  . Endometriosis 1987    LEFT TUBE/OVARY W FOCAL IN-SITU ENDOMETRIAL ADENOCARCINOMA  . Cancer ([place]) 2002    DUCTAL CIS--S/P LUMPECTOMY, RADIATION AND 6 WEEKS OF TAMOXIFEN  . Ejection fraction     EF 60%, echo, October, 2008  . Elevated CPK     January, 2014  . D-dimer, elevated     January, 2014  . Depression   . UTI (lower urinary tract infection)   . GERD (gastroesophageal reflux disease)      occ  . H/O hiatal hernia     ?  [name] Arthritis   . Anemia     hx   BP 133/52 mmHg  Pulse 73  Resp 14  SpO2 97%  Opioid Risk Score:   Fall Risk Score:  `1  Depression screen PHQ 2/9  Depression screen PHQ 2/9 07/11/2014  Decreased Interest 3  Down, Depressed, Hopeless 2  PHQ - 2 Score 5  Altered sleeping 3  Tired, decreased energy 3  Change in appetite 0  Feeling bad or failure about yourself  0  Trouble concentrating 0  Moving slowly or fidgety/restless 0  Suicidal thoughts 0  PHQ-9 Score 11     Review of Systems  All other systems reviewed and are negative.      Objective:   Physical Exam  Constitutional: She is oriented to person, place, and time. She appears well-developed and well-nourished.  HENT:  Head: Normocephalic and atraumatic.  Neck: Normal range of motion. Neck supple.  Cardiovascular: Normal rate and regular rhythm.   Pulmonary/Chest: Effort normal and breath sounds normal.  Musculoskeletal:  Normal Muscle Bulk and Muscle Testing Reveals: Upper Extremities: Full ROM and Muscle Strength 5/5 Lumbar Scoliosis Lumbar Paraspinal Tenderness: L-3-L-5 Lower Extremities: Full ROM and Muscle Strength 5/5 Arises from chair slowly using cadillac walker Antalgic Gait  Neurological: She is alert and oriented to person, place, and time.  Skin: Skin is warm and dry.  Psychiatric: She has a normal mood and affect.  Nursing note and vitals reviewed.         Assessment & Plan:  1 Lumbar post laminectomy syndrome, s/p lumbar fusion:  Refilled: Hydrocodone 10/325mg  one tablet every 6 hours #100 2. Right Hip OA: S/P Right Hip Replacement 10/28/2013. 3.Depression: No Longer Taking Zoloft. Has Family Support and Friends. Counseling with Doristine Bosworth. 4. Right Greater Trochanteric Tenderness: No complaints today. Continue with Ice and Heat Therapy.   20 minutes of face to face patient care time was spent during this visit. All questions were encouraged and  answered.  F/U in 1 month

## 2015-05-28 LAB — TOXASSURE SELECT,+ANTIDEPR,UR: PDF: 0

## 2015-05-29 NOTE — Progress Notes (Signed)
Urine drug screen for this encounter is consistent for prescribed medication 

## 2015-06-08 ENCOUNTER — Ambulatory Visit (INDEPENDENT_AMBULATORY_CARE_PROVIDER_SITE_OTHER): Payer: Medicare Other | Admitting: Cardiology

## 2015-06-08 ENCOUNTER — Encounter: Payer: Self-pay | Admitting: Cardiology

## 2015-06-08 VITALS — BP 122/74 | HR 65 | Ht 60.0 in | Wt 165.0 lb

## 2015-06-08 DIAGNOSIS — I2510 Atherosclerotic heart disease of native coronary artery without angina pectoris: Secondary | ICD-10-CM

## 2015-06-08 DIAGNOSIS — R0609 Other forms of dyspnea: Secondary | ICD-10-CM | POA: Insufficient documentation

## 2015-06-08 DIAGNOSIS — E785 Hyperlipidemia, unspecified: Secondary | ICD-10-CM | POA: Diagnosis not present

## 2015-06-08 DIAGNOSIS — I10 Essential (primary) hypertension: Secondary | ICD-10-CM

## 2015-06-08 DIAGNOSIS — R0600 Dyspnea, unspecified: Secondary | ICD-10-CM | POA: Insufficient documentation

## 2015-06-08 NOTE — Progress Notes (Signed)
Patient ID: Hannah Scott, female   DOB: May 15, 1950, 66 y.o.   MRN: [id number]      Cardiology Office Note  Date:  06/08/2015   ID:  Hannah Scott, DOB Nov 28, 1950, MRN [id number]  PCP:  Binnie Rail, MD  Cardiologist:  Dorothy Spark, MD , previously Dr Ron Parker  Chief complain: Re-establish care   History of Present Illness: Hannah Scott is a 65 y.o. female who presents for follow-up for coronary artery disease. The patient had 2 prior myocardial infarction and to cardiac catheterization the last one in 1999. She was told at the time that her myocardial infarction was result of coronary spasm and she didn't get any stents placed. She has known distal LAD disease.  She has been followed by Dr. Ron Parker and was stable, her last stress test was done in December 2014 where she had mild ischemia and conservative management was decided. She was taking amlodipine for years but developed significant lower extremity edema that resolved after she discontinued. In December 2016 she was hospitalized with acute respiratory failure secondary to asthma attack triggered by influenza A. She states that despite recovery from these her shortness of breath has never resolved. She feels short of breath intermittently on exertion and at rest. She is currently not wheezing and doesn't need to use rescue inhalers. She is otherwise compliant to her medications and denies any orthopnea, paroxysmal nocturnal dyspnea, palpitations or syncope.  Past Medical History  Diagnosis Date  . Asthma     PFTs, February, 2011, moderate obstructive disease with response to bronchodilators, normal lung volumes, moderate reduction in diffusing capacity  . Obstructive airway disease ([place])   . Hyperlipidemia   . Hypertension   . Hypothyroidism     Patient has had in the past that she does not need treatment  . CAD (coronary artery disease)     90% distal LAD in the past  /   nuclear, 2008, no ischemia, ejection fraction 70%  . Atrial septal  aneurysm     Echo, 2008-not noted on 13 echo  . Pinched nerve     lower back  . Kyphoscoliosis   . Shortness of breath   . Endometriosis 1989    RIGHT TUBE  . Endometriosis 1987    LEFT TUBE/OVARY W FOCAL IN-SITU ENDOMETRIAL ADENOCARCINOMA  . Cancer ([place]) 2002    DUCTAL CIS--S/P LUMPECTOMY, RADIATION AND 6 WEEKS OF TAMOXIFEN  . Ejection fraction     EF 60%, echo, October, 2008  . Elevated CPK     January, 2014  . D-dimer, elevated     January, 2014  . Depression   . UTI (lower urinary tract infection)   . GERD (gastroesophageal reflux disease)     occ  . H/O hiatal hernia     ?  [name] Arthritis   . Anemia     hx   Past Surgical History  Procedure Laterality Date  . Cataract extraction Bilateral 01,03  . Cardiovascular stress test  09/07/05    Nuclear, was negative  . Breast lumpectomy  2003    radiation on right  . Appendectomy  1987    AT TAH  . Total abdominal hysterectomy  1987    LSO, APPENDECTOMY  . Pelvic laparoscopy  1989    RSO, LYSIS OF ADHESIONS  . Oophorectomy      LSO -RSO  . Back surgery      Fusion  . Ankle surgery Left     ligament  .  Total hip arthroplasty Right 10/28/2013    dr Lorin Mercy  . Total hip arthroplasty Right 10/28/2013    Procedure: TOTAL HIP ARTHROPLASTY ANTERIOR APPROACH;  Surgeon: Marybelle Killings, MD;  Location: [facility];  Service: Orthopedics;  Laterality: Right;  Right Total Hip Arthroplasty, Direct Anterior Approach    Current Outpatient Prescriptions  Medication Sig Dispense Refill  . albuterol (PROAIR HFA) 108 (90 BASE) MCG/ACT inhaler Inhale 1-2 puffs into the lungs every 6 (six) hours as needed. 1 Inhaler 2  . aspirin EC 325 MG tablet Take 1 tablet (325 mg total) by mouth daily. 30 tablet 0  . atorvastatin (LIPITOR) 40 MG tablet TAKE ONE TABLET BY MOUTH AT BEDTIME 90 tablet 3  . DULoxetine (CYMBALTA) 20 MG capsule Take 1 capsule (20 mg total) by mouth daily. 30 capsule 3  . Fluticasone Furoate-Vilanterol 100-25 MCG/INH AEPB Inhale 1 puff  into the lungs daily. 60 each 5  . gabapentin (NEURONTIN) 300 MG capsule Take 1 capsule (300 mg total) by mouth at bedtime. 30 capsule 5  . hydrochlorothiazide (MICROZIDE) 12.5 MG capsule Take 12.5 mg by mouth as needed.    [name] HYDROcodone-acetaminophen (NORCO) 10-325 MG tablet Take 1 tablet by mouth every 6 (six) hours as needed. 100 tablet 0  . ipratropium-albuterol (DUONEB) 0.5-2.5 (3) MG/3ML SOLN Take 3 mLs by nebulization every 4 (four) hours as needed. Must not be qid unless during exacerbation. Dx 493.90 360 mL 1  . levothyroxine (SYNTHROID, LEVOTHROID) 75 MCG tablet Take 1 tablet (75 mcg total) by mouth daily before breakfast. 30 tablet 11   No current facility-administered medications for this visit.    Allergies:   Fluticasone-salmeterol; Norvasc; and Tape    Social History:  The patient  reports that she has never smoked. She has never used smokeless tobacco. She reports that she does not drink alcohol or use illicit drugs.   Family History:  The patient's family history includes Asthma in her paternal uncle; Heart attack in her father and paternal uncle; Heart disease in her father and mother; Heart failure in her mother; Hypertension in her maternal grandfather and mother; Pneumonia in her mother; Stroke in her maternal grandfather.   ROS:  Please see the history of present illness.   Otherwise, review of systems are positive for none.   All other systems are reviewed and negative.   PHYSICAL EXAM: VS:  Ht 5' (1.524 m)  Wt 165 lb (74.844 kg)  BMI 32.22 kg/m2 , BMI Body mass index is 32.22 kg/(m^2). GEN: Well nourished, well developed, in no acute distress HEENT: normal Neck: no JVD, carotid bruits, or masses Cardiac: RRR; no murmurs, rubs, or gallops,no edema  Respiratory:  clear to auscultation bilaterally, normal work of breathing GI: soft, nontender, nondistended, + BS MS: no deformity or atrophy Skin: warm and dry, no rash Neuro:  Strength and sensation are  intact Psych: euthymic mood, full affect  EKG:  EKG is not ordered today. The ekg ordered during hospitalization in December 2016 shows sinus tachycardia, otherwise EKG unchanged from prior in 2015.  Recent Labs: 04/25/2015: BUN 20; Creatinine, Ser 0.45; Hemoglobin 11.8*; Platelets 259; Potassium 4.9; Sodium 142 05/14/2015: TSH 2.89   Lipid Panel    Component Value Date/Time   CHOL 120 02/12/2015 1014   TRIG 46.0 02/12/2015 1014   HDL 56.50 02/12/2015 1014   CHOLHDL 2 02/12/2015 1014   VLDL 9.2 02/12/2015 1014   LDLCALC 55 02/12/2015 1014   LDLDIRECT 154.8 02/20/2009 1116     Wt  Readings from Last 3 Encounters:  06/08/15 165 lb (74.844 kg)  05/14/15 161 lb (73.029 kg)  05/06/15 165 lb (74.844 kg)    TTE: 08/31/2011 Left ventricle: The cavity size was normal. Wall thickness was increased in a pattern of mild LVH. Systolic function was normal. The estimated ejection fraction was 55%. Wall motion was normal; there were no regional wall motion abnormalities. - Left atrium: The atrium was mildly dilated. - Atrial septum: No defect or patent foramen ovale was identified. - Pulmonary arteries: PA peak pressure: 68mm Hg (S).    ASSESSMENT AND PLAN:  1. Coronary artery disease  - known LAD disease and prior to myocardial infarctions, now worsening dyspnea on exertion and shortness of breath at rest. We'll schedule a Lexiscan nuclear stress test to evaluate for ischemia.   2. Hyperlipidemia  - on atorvastatin 40 mg daily that swirl tolerated and her lipids are at goal.   3. Hypertension  - well controlled   4. Anemia - almost resolved, post surgical sec to blood loss.  Follow up in 2 months.  Signed, Dorothy Spark, MD  06/08/2015 1:48 PM    [facility] [address], [place], [place]  [address] Phone: [phone]; Fax: [phone]

## 2015-06-08 NOTE — Patient Instructions (Signed)
Your physician recommends that you continue on your current medications as directed. Please refer to the Current Medication list given to you today.  Your physician has requested that you have a lexiscan myoview. For further information please visit [email or web address]. Please follow instruction sheet, as given.  Your physician recommends that you schedule a follow-up appointment at Dr. Francesca Oman next available appointment day.

## 2015-06-15 ENCOUNTER — Telehealth (HOSPITAL_COMMUNITY): Payer: Self-pay | Admitting: Radiology

## 2015-06-15 NOTE — Telephone Encounter (Signed)
Patient given detailed instructions per Myocardial Perfusion Study Information Sheet for the test on 06/30/2015 at 7:45. Patient notified to arrive 15 minutes early and that it is imperative to arrive on time for appointment to keep from having the test rescheduled.  If you need to cancel or reschedule your appointment, please call the office within 24 hours of your appointment. Failure to do so may result in a cancellation of your appointment, and a $50 no show fee. Patient verbalized understanding.EHK

## 2015-06-17 ENCOUNTER — Ambulatory Visit: Payer: Medicare Other | Admitting: Internal Medicine

## 2015-06-18 ENCOUNTER — Encounter (HOSPITAL_COMMUNITY): Payer: Medicare Other

## 2015-06-25 ENCOUNTER — Telehealth (HOSPITAL_COMMUNITY): Payer: Self-pay | Admitting: *Deleted

## 2015-06-25 NOTE — Telephone Encounter (Signed)
Patient given detailed instructions per Myocardial Perfusion Study Information Sheet for the test on 06/30/15. Patient notified to arrive 15 minutes early and that it is imperative to arrive on time for appointment to keep from having the test rescheduled.  If you need to cancel or reschedule your appointment, please call the office within 24 hours of your appointment. Failure to do so may result in a cancellation of your appointment, and a $50 no show fee. Patient verbalized understanding.Oliverio Cho J Mack Thurmon,RN  

## 2015-06-29 ENCOUNTER — Encounter: Payer: Self-pay | Admitting: Registered Nurse

## 2015-06-29 ENCOUNTER — Encounter: Payer: Medicare Other | Attending: Physical Medicine & Rehabilitation | Admitting: Registered Nurse

## 2015-06-29 VITALS — BP 142/66 | HR 81 | Resp 14

## 2015-06-29 DIAGNOSIS — M419 Scoliosis, unspecified: Secondary | ICD-10-CM

## 2015-06-29 DIAGNOSIS — Z79899 Other long term (current) drug therapy: Secondary | ICD-10-CM

## 2015-06-29 DIAGNOSIS — M1611 Unilateral primary osteoarthritis, right hip: Secondary | ICD-10-CM | POA: Diagnosis not present

## 2015-06-29 DIAGNOSIS — G894 Chronic pain syndrome: Secondary | ICD-10-CM

## 2015-06-29 DIAGNOSIS — M4806 Spinal stenosis, lumbar region: Secondary | ICD-10-CM | POA: Insufficient documentation

## 2015-06-29 DIAGNOSIS — M961 Postlaminectomy syndrome, not elsewhere classified: Secondary | ICD-10-CM | POA: Diagnosis not present

## 2015-06-29 DIAGNOSIS — M24551 Contracture, right hip: Secondary | ICD-10-CM

## 2015-06-29 DIAGNOSIS — Z5181 Encounter for therapeutic drug level monitoring: Secondary | ICD-10-CM

## 2015-06-29 DIAGNOSIS — M4126 Other idiopathic scoliosis, lumbar region: Secondary | ICD-10-CM | POA: Diagnosis not present

## 2015-06-29 DIAGNOSIS — M48062 Spinal stenosis, lumbar region with neurogenic claudication: Secondary | ICD-10-CM

## 2015-06-29 MED ORDER — HYDROCODONE-ACETAMINOPHEN 10-325 MG PO TABS: 1.0000 | ORAL_TABLET | Freq: Four times a day (QID) | ORAL | Status: DC | PRN

## 2015-06-29 NOTE — Progress Notes (Signed)
Subjective:    Patient ID: Hannah Scott, female    DOB: 1950-08-30, 65 y.o.   MRN: [id number]  HPI: Hannah Scott is a 65 year old female who returns for follow up for chronic pain and medication refill. She states her pain is located in her lower back and right hip occassionally. She rates her pain 6. Her current exercise regime is walking for short distances using her cadillac walker or cane in her home and using her stationary bicycle.  Pain Inventory Average Pain 7 Pain Right Now 6 My pain is constant, tingling and aching  In the last 24 hours, has pain interfered with the following? General activity 10 Relation with others 10 Enjoyment of life 10 What TIME of day is your pain at its worst? all Sleep (in general) Poor  Pain is worse with: walking and standing Pain improves with: rest and medication Relief from Meds: 6  Mobility walk with assistance use a cane use a walker ability to climb steps?  yes do you drive?  yes  Function disabled: date disabled . I need assistance with the following:  household duties  Neuro/Psych weakness numbness trouble walking  Prior Studies Any changes since last visit?  no  Physicians involved in your care Any changes since last visit?  no   Family History  Problem Relation Age of Onset  . Heart attack Father   . Heart disease Father   . Heart failure Mother   . Pneumonia Mother   . Hypertension Mother   . Heart disease Mother   . Stroke Maternal Grandfather   . Hypertension Maternal Grandfather   . Asthma Paternal Uncle     PAT UNCLES  . Heart attack Paternal Uncle    Social History   Social History  . Marital Status: Married    Spouse Name: N/A  . Number of Children: N/A  . Years of Education: N/A   Social History Main Topics  . Smoking status: Never Smoker   . Smokeless tobacco: Never Used  . Alcohol Use: No  . Drug Use: No  . Sexual Activity: No   Other Topics Concern  . None   Social History  Narrative   Past Surgical History  Procedure Laterality Date  . Cataract extraction Bilateral 01,03  . Cardiovascular stress test  09/07/05    Nuclear, was negative  . Breast lumpectomy  2003    radiation on right  . Appendectomy  1987    AT TAH  . Total abdominal hysterectomy  1987    LSO, APPENDECTOMY  . Pelvic laparoscopy  1989    RSO, LYSIS OF ADHESIONS  . Oophorectomy      LSO -RSO  . Back surgery      Fusion  . Ankle surgery Left     ligament  . Total hip arthroplasty Right 10/28/2013    dr Lorin Mercy  . Total hip arthroplasty Right 10/28/2013    Procedure: TOTAL HIP ARTHROPLASTY ANTERIOR APPROACH;  Surgeon: Marybelle Killings, MD;  Location: [facility];  Service: Orthopedics;  Laterality: Right;  Right Total Hip Arthroplasty, Direct Anterior Approach   Past Medical History  Diagnosis Date  . Asthma     PFTs, February, 2011, moderate obstructive disease with response to bronchodilators, normal lung volumes, moderate reduction in diffusing capacity  . Obstructive airway disease ([place])   . Hyperlipidemia   . Hypertension   . Hypothyroidism     Patient has had in the past that she does  not need treatment  . CAD (coronary artery disease)     90% distal LAD in the past  /   nuclear, 2008, no ischemia, ejection fraction 70%  . Atrial septal aneurysm     Echo, 2008-not noted on 13 echo  . Pinched nerve     lower back  . Kyphoscoliosis   . Shortness of breath   . Endometriosis 1989    RIGHT TUBE  . Endometriosis 1987    LEFT TUBE/OVARY W FOCAL IN-SITU ENDOMETRIAL ADENOCARCINOMA  . Cancer ([place]) 2002    DUCTAL CIS--S/P LUMPECTOMY, RADIATION AND 6 WEEKS OF TAMOXIFEN  . Ejection fraction     EF 60%, echo, October, 2008  . Elevated CPK     January, 2014  . D-dimer, elevated     January, 2014  . Depression   . UTI (lower urinary tract infection)   . GERD (gastroesophageal reflux disease)     occ  . H/O hiatal hernia     ?  [name] Arthritis   . Anemia     hx   BP 142/66 mmHg  Pulse 81   Resp 14  SpO2 95%  Opioid Risk Score:   Fall Risk Score:  `1  Depression screen PHQ 2/9  Depression screen PHQ 2/9 07/11/2014  Decreased Interest 3  Down, Depressed, Hopeless 2  PHQ - 2 Score 5  Altered sleeping 3  Tired, decreased energy 3  Change in appetite 0  Feeling bad or failure about yourself  0  Trouble concentrating 0  Moving slowly or fidgety/restless 0  Suicidal thoughts 0  PHQ-9 Score 11     Review of Systems  All other systems reviewed and are negative.      Objective:   Physical Exam  Constitutional: She is oriented to person, place, and time. She appears well-developed and well-nourished.  HENT:  Head: Normocephalic and atraumatic.  Neck: Normal range of motion. Neck supple.  Cardiovascular: Normal rate and regular rhythm.   Pulmonary/Chest: Effort normal and breath sounds normal.  Musculoskeletal:  Normal Muscle Bulk and Muscle Testing Reveals: Upper Extremities: Full ROM and Muscle Strength 5/5 Lumbar Scoliosis Lumbar Paraspinal Tenderness: L-3- L-5 Lower Extremities: Full ROM and Muscle Strength 5/5 Right Lower Extremity Flexion Produces Pain into Right Hip Left: Full ROM and Muscle Strength 5/5 Arises from chair slowly using walker for support Antalgic gait  Neurological: She is alert and oriented to person, place, and time.  Skin: Skin is warm and dry.  Psychiatric: She has a normal mood and affect.  Nursing note and vitals reviewed.         Assessment & Plan:  1 Lumbar post laminectomy syndrome, s/p lumbar fusion:  Refilled: Hydrocodone 10/325mg  one tablet every 6 hours #100 2. Right Hip OA: S/P Right Hip Replacement 10/28/2013. 3.Depression: No Longer Taking Zoloft. Has Family Support and Friends. Counseling with Hannah Scott. 4. Right Greater Trochanteric Tenderness: Continue with Ice and Heat Therapy.   20 minutes of face to face patient care time was spent during this visit. All questions were encouraged and answered.  F/U in 1  month

## 2015-06-30 ENCOUNTER — Ambulatory Visit (HOSPITAL_COMMUNITY): Payer: Medicare Other | Attending: Cardiology

## 2015-06-30 DIAGNOSIS — R0602 Shortness of breath: Secondary | ICD-10-CM | POA: Insufficient documentation

## 2015-06-30 DIAGNOSIS — R0600 Dyspnea, unspecified: Secondary | ICD-10-CM

## 2015-06-30 DIAGNOSIS — R0609 Other forms of dyspnea: Secondary | ICD-10-CM | POA: Diagnosis not present

## 2015-06-30 DIAGNOSIS — I2510 Atherosclerotic heart disease of native coronary artery without angina pectoris: Secondary | ICD-10-CM

## 2015-06-30 DIAGNOSIS — Z8249 Family history of ischemic heart disease and other diseases of the circulatory system: Secondary | ICD-10-CM | POA: Diagnosis not present

## 2015-06-30 DIAGNOSIS — I10 Essential (primary) hypertension: Secondary | ICD-10-CM | POA: Insufficient documentation

## 2015-06-30 DIAGNOSIS — R9439 Abnormal result of other cardiovascular function study: Secondary | ICD-10-CM | POA: Insufficient documentation

## 2015-06-30 LAB — MYOCARDIAL PERFUSION IMAGING
Estimated workload: 1 METS
LV dias vol: 86 mL
LV sys vol: 38 mL
Peak HR: 90 {beats}/min
Percent of predicted max HR: 58 %
RATE: 0.41
Rest HR: 69 {beats}/min
SDS: 4
SRS: 4
SSS: 8
Stage 1 DBP: 91 mmHg
Stage 1 Grade: 0 %
Stage 1 HR: 71 {beats}/min
Stage 1 SBP: 153 mmHg
Stage 1 Speed: 0 mph
Stage 2 Grade: 0 %
Stage 2 HR: 71 {beats}/min
Stage 2 Speed: 0 mph
Stage 3 DBP: 78 mmHg
Stage 3 Grade: 0 %
Stage 3 HR: 85 {beats}/min
Stage 3 SBP: 152 mmHg
Stage 3 Speed: 0 mph
Stage 4 Grade: 0 %
Stage 4 HR: 90 {beats}/min
Stage 4 Speed: 0 mph
Stage 5 DBP: 84 mmHg
Stage 5 Grade: 0 %
Stage 5 HR: 90 {beats}/min
Stage 5 SBP: 156 mmHg
Stage 5 Speed: 0 mph
Stage 6 DBP: 86 mmHg
Stage 6 Grade: 0 %
Stage 6 HR: 81 {beats}/min
Stage 6 SBP: 161 mmHg
Stage 6 Speed: 0 mph
TID: 1

## 2015-06-30 MED ORDER — TECHNETIUM TC 99M SESTAMIBI GENERIC - CARDIOLITE
30.0000 | Freq: Once | INTRAVENOUS | Status: AC | PRN
  Administered 2015-06-30: 30 via INTRAVENOUS

## 2015-06-30 MED ORDER — REGADENOSON 0.4 MG/5ML IV SOLN
0.4000 mg | Freq: Once | INTRAVENOUS | Status: AC
  Administered 2015-06-30: 0.4 mg via INTRAVENOUS

## 2015-06-30 MED ORDER — TECHNETIUM TC 99M SESTAMIBI GENERIC - CARDIOLITE
10.7000 | Freq: Once | INTRAVENOUS | Status: AC | PRN
  Administered 2015-06-30: 11 via INTRAVENOUS

## 2015-07-03 ENCOUNTER — Ambulatory Visit (INDEPENDENT_AMBULATORY_CARE_PROVIDER_SITE_OTHER): Payer: Medicare Other | Admitting: Internal Medicine

## 2015-07-03 ENCOUNTER — Encounter: Payer: Self-pay | Admitting: Internal Medicine

## 2015-07-03 VITALS — BP 132/86 | HR 63 | Temp 98.6°F | Resp 16 | Wt 166.0 lb

## 2015-07-03 DIAGNOSIS — F32A Depression, unspecified: Secondary | ICD-10-CM

## 2015-07-03 DIAGNOSIS — F329 Major depressive disorder, single episode, unspecified: Secondary | ICD-10-CM

## 2015-07-03 MED ORDER — DULOXETINE HCL 30 MG PO CPEP: 30.0000 mg | ORAL_CAPSULE | Freq: Every day | ORAL | Status: DC

## 2015-07-03 NOTE — Progress Notes (Signed)
Patient received education resource, including the self-management goal and tool. Patient verbalized understanding. 

## 2015-07-03 NOTE — Progress Notes (Signed)
Subjective:    Patient ID: Hannah Scott, female    DOB: [birth year], 65 y.o.   MRN: [id number]  HPI She is here for follow up of depression and fatigue.   We started cymbalta 4 weeks ago.  She denies any side effects.  She is not crying as much and does not want to sit down and do nothing.  She feels a little better overall.  She can focus a little better.  Since starting the medication she has very little increase in energy.  She still getting out of breath. She has seen cardiology and had a stress test and it was negative.  She has an appointment with pulmonary at the end of this month.     She wonders about increasing the dose of the cymbalta.     Medications and allergies reviewed with patient and updated if appropriate.  Patient Active Problem List   Diagnosis Date Noted  . Essential hypertension 06/08/2015  . Coronary artery disease involving native coronary artery of native heart without angina pectoris 06/08/2015  . DOE (dyspnea on exertion) 06/08/2015  . Fatigue 05/20/2015  . Influenza A 04/23/2015  . Acute respiratory failure with hypoxia ([place]) 04/22/2015  . Status asthmaticus 04/22/2015  . Back pain 04/22/2015  . Acute respiratory failure with hypercapnia ([place])   . Lumbar scoliosis 01/10/2014  . Hypocalcemia 01/03/2014  . Anemia due to blood loss 11/27/2013  . Edema 11/27/2013  . Elevated CPK   . D-dimer, elevated   . Multinodular goiter 12/01/2011  . Asthma exacerbation   . Hypothyroidism   . CAD (coronary artery disease)   . Ejection fraction   . Atrial septal aneurysm   . Hyperlipidemia   . Hypertension   . Shortness of breath   . Scoliosis   . Spinal stenosis, lumbar region, with neurogenic claudication 07/01/2011  . Lumbar postlaminectomy syndrome 07/01/2011  . Osteoarthritis of right hip 07/01/2011  . Contracture of right hip 07/01/2011  . Endometriosis   . Depression 12/24/2009  . Migraine headache 05/19/2006    Current Outpatient Prescriptions on  File Prior to Visit  Medication Sig Dispense Refill  . albuterol (PROAIR HFA) 108 (90 BASE) MCG/ACT inhaler Inhale 1-2 puffs into the lungs every 6 (six) hours as needed. 1 Inhaler 2  . aspirin EC 325 MG tablet Take 1 tablet (325 mg total) by mouth daily. 30 tablet 0  . atorvastatin (LIPITOR) 40 MG tablet TAKE ONE TABLET BY MOUTH AT BEDTIME 90 tablet 3  . benazepril (LOTENSIN) 40 MG tablet Take 20 mg by mouth daily.    . DULoxetine (CYMBALTA) 20 MG capsule Take 1 capsule (20 mg total) by mouth daily. 30 capsule 3  . Fluticasone Furoate-Vilanterol 100-25 MCG/INH AEPB Inhale 1 puff into the lungs daily. 60 each 5  . gabapentin (NEURONTIN) 300 MG capsule Take 1 capsule (300 mg total) by mouth at bedtime. 30 capsule 5  . hydrochlorothiazide (MICROZIDE) 12.5 MG capsule Take 12.5 mg by mouth as needed.    [name] HYDROcodone-acetaminophen (NORCO) 10-325 MG tablet Take 1 tablet by mouth every 6 (six) hours as needed. 100 tablet 0  . ipratropium-albuterol (DUONEB) 0.5-2.5 (3) MG/3ML SOLN Take 3 mLs by nebulization every 4 (four) hours as needed. Must not be qid unless during exacerbation. Dx 493.90 360 mL 1  . levothyroxine (SYNTHROID, LEVOTHROID) 75 MCG tablet Take 1 tablet (75 mcg total) by mouth daily before breakfast. 30 tablet 11  . metoprolol (LOPRESSOR) 50 MG tablet Take 50 mg  by mouth 2 (two) times daily.     No current facility-administered medications on file prior to visit.    Past Medical History  Diagnosis Date  . Asthma     PFTs, February, 2011, moderate obstructive disease with response to bronchodilators, normal lung volumes, moderate reduction in diffusing capacity  . Obstructive airway disease ([place])   . Hyperlipidemia   . Hypertension   . Hypothyroidism     Patient has had in the past that she does not need treatment  . CAD (coronary artery disease)     90% distal LAD in the past  /   nuclear, 2008, no ischemia, ejection fraction 70%  . Atrial septal aneurysm     Echo, 2008-not  noted on 13 echo  . Pinched nerve     lower back  . Kyphoscoliosis   . Shortness of breath   . Endometriosis 1989    RIGHT TUBE  . Endometriosis 1987    LEFT TUBE/OVARY W FOCAL IN-SITU ENDOMETRIAL ADENOCARCINOMA  . Cancer ([place]) 2002    DUCTAL CIS--S/P LUMPECTOMY, RADIATION AND 6 WEEKS OF TAMOXIFEN  . Ejection fraction     EF 60%, echo, October, 2008  . Elevated CPK     January, 2014  . D-dimer, elevated     January, 2014  . Depression   . UTI (lower urinary tract infection)   . GERD (gastroesophageal reflux disease)     occ  . H/O hiatal hernia     ?  [name] Arthritis   . Anemia     hx    Past Surgical History  Procedure Laterality Date  . Cataract extraction Bilateral 01,03  . Cardiovascular stress test  09/07/05    Nuclear, was negative  . Breast lumpectomy  2003    radiation on right  . Appendectomy  1987    AT TAH  . Total abdominal hysterectomy  1987    LSO, APPENDECTOMY  . Pelvic laparoscopy  1989    RSO, LYSIS OF ADHESIONS  . Oophorectomy      LSO -RSO  . Back surgery      Fusion  . Ankle surgery Left     ligament  . Total hip arthroplasty Right 10/28/2013    dr Lorin Mercy  . Total hip arthroplasty Right 10/28/2013    Procedure: TOTAL HIP ARTHROPLASTY ANTERIOR APPROACH;  Surgeon: Marybelle Killings, MD;  Location: [facility];  Service: Orthopedics;  Laterality: Right;  Right Total Hip Arthroplasty, Direct Anterior Approach    Social History   Social History  . Marital Status: Married    Spouse Name: N/A  . Number of Children: N/A  . Years of Education: N/A   Social History Main Topics  . Smoking status: Never Smoker   . Smokeless tobacco: Never Used  . Alcohol Use: No  . Drug Use: No  . Sexual Activity: No   Other Topics Concern  . None   Social History Narrative    Family History  Problem Relation Age of Onset  . Heart attack Father   . Heart disease Father   . Heart failure Mother   . Pneumonia Mother   . Hypertension Mother   . Heart disease Mother     . Stroke Maternal Grandfather   . Hypertension Maternal Grandfather   . Asthma Paternal Uncle     PAT UNCLES  . Heart attack Paternal Uncle     Review of Systems  Constitutional: Positive for appetite change (still decreased).  Respiratory: Positive for  shortness of breath. Negative for cough and wheezing.   Cardiovascular: Positive for palpitations (infrequent). Negative for chest pain and leg swelling.  Neurological: Positive for light-headedness (infrequent with palptations.) and headaches (occasional only).       Objective:   Filed Vitals:   07/03/15 1610  BP: 132/86  Pulse: 63  Temp: 98.6 F (37 C)  Resp: 16   Filed Weights   07/03/15 1610  Weight: 166 lb (75.297 kg)   Body mass index is 32.42 kg/(m^2).   Physical Exam  Constitutional: She is oriented to person, place, and time. She appears well-developed and well-nourished. No distress.  Neurological: She is alert and oriented to person, place, and time.  Psychiatric: She has a normal mood and affect. Her behavior is normal. Judgment and thought content normal.        Assessment & Plan:   See Problem List for Assessment and Plan of chronic medical problems.  Follow up in 6 months, sooner if needed

## 2015-07-03 NOTE — Patient Instructions (Addendum)
We have increased the cymbalta to  30 mg daily.  If you have any questions or concerns please call or come.    Please followup in 6 months

## 2015-07-04 NOTE — Assessment & Plan Note (Signed)
Tolerating cymbalta 20 mg daily She has seen improvement in her depression and we both feel she would benefit from an increase in dose - hopefully it will further improve her depression and she will tolerate the higher dose Increase cymbalta to 30 mg daily She will call with questions or concerns.

## 2015-07-08 ENCOUNTER — Other Ambulatory Visit: Payer: Self-pay | Admitting: Cardiology

## 2015-07-22 ENCOUNTER — Ambulatory Visit (INDEPENDENT_AMBULATORY_CARE_PROVIDER_SITE_OTHER): Payer: Medicare Other | Admitting: Cardiology

## 2015-07-22 ENCOUNTER — Encounter: Payer: Self-pay | Admitting: Cardiology

## 2015-07-22 VITALS — BP 120/60 | HR 88 | Ht 60.0 in | Wt 164.0 lb

## 2015-07-22 DIAGNOSIS — I2510 Atherosclerotic heart disease of native coronary artery without angina pectoris: Secondary | ICD-10-CM

## 2015-07-22 DIAGNOSIS — E785 Hyperlipidemia, unspecified: Secondary | ICD-10-CM | POA: Diagnosis not present

## 2015-07-22 DIAGNOSIS — I2583 Coronary atherosclerosis due to lipid rich plaque: Secondary | ICD-10-CM

## 2015-07-22 DIAGNOSIS — I10 Essential (primary) hypertension: Secondary | ICD-10-CM | POA: Diagnosis not present

## 2015-07-22 MED ORDER — ATORVASTATIN CALCIUM 20 MG PO TABS: 20.0000 mg | ORAL_TABLET | Freq: Every day | ORAL | Status: DC

## 2015-07-22 NOTE — Patient Instructions (Signed)
Your physician has recommended you make the following change in your medication:  1.) decrease atorvastatin to 20 mg once daily--pick up your new prescription  Your physician recommends that you return for lab work in: 6 months prior to your next appointment (BMET, CBC, TSH, LFTS, LIPIDS) FASTING  Your physician wants you to follow-up in: [place].   You will receive a reminder letter in the mail two months in advance. If you don't receive a letter, please call our office to schedule the follow-up appointment.

## 2015-07-22 NOTE — Addendum Note (Signed)
Addended by: Roberts Gaudy on: 07/22/2015 01:08 PM   Modules accepted: Orders, Medications

## 2015-07-22 NOTE — Progress Notes (Signed)
Patient ID: Hannah Scott, female   DOB: 03/25/1951, 65 y.o.   MRN: [id number]      Cardiology Office Note  Date:  07/22/2015   ID:  Hannah Scott, DOB 1950/05/13, MRN [id number]  PCP:  Binnie Rail, MD  Cardiologist:  Dorothy Spark, MD , previously Dr Ron Parker  Chief complain: Re-establish care   History of Present Illness:  Hannah Scott is a 65 y.o. female who presents for follow-up for coronary artery disease. The patient had 2 prior myocardial infarction and to cardiac catheterization the last one in 1999. She was told at the time that her myocardial infarction was result of coronary spasm and she didn't get any stents placed. She has known distal LAD disease.  She has been followed by Dr. Ron Parker and was stable, her last stress test was done in December 2014 where she had mild ischemia and conservative management was decided. She was taking amlodipine for years but developed significant lower extremity edema that resolved after she discontinued. In December 2016 she was hospitalized with acute respiratory failure secondary to asthma attack triggered by influenza A. She states that despite recovery from these her shortness of breath has never resolved. She feels short of breath intermittently on exertion and at rest. She is currently not wheezing and doesn't need to use rescue inhalers. She is otherwise compliant to her medications and denies any orthopnea, paroxysmal nocturnal dyspnea, palpitations or syncope.  07/22/2015 - 6 months follow up, the recent stress test was negative for ischemia, normal LVEF, she denies worsening DOE, CP, syncope, LE edema, she has occasional very short palpitations that only lasts seconds and they're not associated with any other symptoms. Her major limitation for walking is history of scoliosis and back pain, she has limited walking abilities with a walker. She denies any orthopnea. He is compliant with her medicines and has no side effects from atorvastatin.  Past  Medical History  Diagnosis Date  . Asthma     PFTs, February, 2011, moderate obstructive disease with response to bronchodilators, normal lung volumes, moderate reduction in diffusing capacity  . Obstructive airway disease ([place])   . Hyperlipidemia   . Hypertension   . Hypothyroidism     Patient has had in the past that she does not need treatment  . CAD (coronary artery disease)     90% distal LAD in the past  /   nuclear, 2008, no ischemia, ejection fraction 70%  . Atrial septal aneurysm     Echo, 2008-not noted on 13 echo  . Pinched nerve     lower back  . Kyphoscoliosis   . Shortness of breath   . Endometriosis 1989    RIGHT TUBE  . Endometriosis 1987    LEFT TUBE/OVARY W FOCAL IN-SITU ENDOMETRIAL ADENOCARCINOMA  . Cancer ([place]) 2002    DUCTAL CIS--S/P LUMPECTOMY, RADIATION AND 6 WEEKS OF TAMOXIFEN  . Ejection fraction     EF 60%, echo, October, 2008  . Elevated CPK     January, 2014  . D-dimer, elevated     January, 2014  . Depression   . UTI (lower urinary tract infection)   . GERD (gastroesophageal reflux disease)     occ  . H/O hiatal hernia     ?  [name] Arthritis   . Anemia     hx   Past Surgical History  Procedure Laterality Date  . Cataract extraction Bilateral 01,03  . Cardiovascular stress test  09/07/05  Nuclear, was negative  . Breast lumpectomy  2003    radiation on right  . Appendectomy  1987    AT TAH  . Total abdominal hysterectomy  1987    LSO, APPENDECTOMY  . Pelvic laparoscopy  1989    RSO, LYSIS OF ADHESIONS  . Oophorectomy      LSO -RSO  . Back surgery      Fusion  . Ankle surgery Left     ligament  . Total hip arthroplasty Right 10/28/2013    dr Lorin Mercy  . Total hip arthroplasty Right 10/28/2013    Procedure: TOTAL HIP ARTHROPLASTY ANTERIOR APPROACH;  Surgeon: Marybelle Killings, MD;  Location: [facility];  Service: Orthopedics;  Laterality: Right;  Right Total Hip Arthroplasty, Direct Anterior Approach    Current Outpatient Prescriptions    Medication Sig Dispense Refill  . albuterol (PROAIR HFA) 108 (90 BASE) MCG/ACT inhaler Inhale 1-2 puffs into the lungs every 6 (six) hours as needed. 1 Inhaler 2  . aspirin EC 325 MG tablet Take 1 tablet (325 mg total) by mouth daily. 30 tablet 0  . atorvastatin (LIPITOR) 40 MG tablet TAKE ONE TABLET BY MOUTH AT BEDTIME 90 tablet 0  . benazepril (LOTENSIN) 40 MG tablet Take 20 mg by mouth daily.    . DULoxetine (CYMBALTA) 30 MG capsule Take 1 capsule (30 mg total) by mouth daily. 30 capsule 5  . Fluticasone Furoate-Vilanterol 100-25 MCG/INH AEPB Inhale 1 puff into the lungs daily. 60 each 5  . gabapentin (NEURONTIN) 300 MG capsule Take 1 capsule (300 mg total) by mouth at bedtime. 30 capsule 5  . hydrochlorothiazide (MICROZIDE) 12.5 MG capsule Take 12.5 mg by mouth as needed.    [name] HYDROcodone-acetaminophen (NORCO) 10-325 MG tablet Take 1 tablet by mouth every 6 (six) hours as needed. 100 tablet 0  . ipratropium-albuterol (DUONEB) 0.5-2.5 (3) MG/3ML SOLN Take 3 mLs by nebulization every 4 (four) hours as needed. Must not be qid unless during exacerbation. Dx 493.90 360 mL 1  . levothyroxine (SYNTHROID, LEVOTHROID) 75 MCG tablet Take 1 tablet (75 mcg total) by mouth daily before breakfast. 30 tablet 11  . metoprolol (LOPRESSOR) 50 MG tablet Take 50 mg by mouth 2 (two) times daily.     No current facility-administered medications for this visit.    Allergies:   Fluticasone-salmeterol; Norvasc; and Tape    Social History:  The patient  reports that she has never smoked. She has never used smokeless tobacco. She reports that she does not drink alcohol or use illicit drugs.   Family History:  The patient's family history includes Asthma in her paternal uncle; Heart attack in her father and paternal uncle; Heart disease in her father and mother; Heart failure in her mother; Hypertension in her maternal grandfather and mother; Pneumonia in her mother; Stroke in her maternal grandfather.   ROS:   Please see the history of present illness.   Otherwise, review of systems are positive for none.   All other systems are reviewed and negative.   PHYSICAL EXAM: VS:  BP 120/60 mmHg  Pulse 88  Ht 5' (1.524 m)  Wt 164 lb (74.39 kg)  BMI 32.03 kg/m2  SpO2 94% , BMI Body mass index is 32.03 kg/(m^2). GEN: Well nourished, well developed, in no acute distress HEENT: normal Neck: no JVD, carotid bruits, or masses Cardiac: RRR; no murmurs, rubs, or gallops,no edema  Respiratory:  clear to auscultation bilaterally, normal work of breathing GI: soft, nontender, nondistended, + BS  MS: no deformity or atrophy Skin: warm and dry, no rash Neuro:  Strength and sensation are intact Psych: euthymic mood, full affect  EKG:  EKG is not ordered today. The ekg ordered during hospitalization in December 2016 shows sinus tachycardia, otherwise EKG unchanged from prior in 2015.  Recent Labs: 04/25/2015: BUN 20; Creatinine, Ser 0.45; Hemoglobin 11.8*; Platelets 259; Potassium 4.9; Sodium 142 05/14/2015: TSH 2.89   Lipid Panel    Component Value Date/Time   CHOL 120 02/12/2015 1014   TRIG 46.0 02/12/2015 1014   HDL 56.50 02/12/2015 1014   CHOLHDL 2 02/12/2015 1014   VLDL 9.2 02/12/2015 1014   LDLCALC 55 02/12/2015 1014   LDLDIRECT 154.8 02/20/2009 1116     Wt Readings from Last 3 Encounters:  07/22/15 164 lb (74.39 kg)  07/03/15 166 lb (75.297 kg)  06/08/15 165 lb (74.844 kg)    TTE: 08/31/2011 Left ventricle: The cavity size was normal. Wall thickness was increased in a pattern of mild LVH. Systolic function was normal. The estimated ejection fraction was 55%. Wall motion was normal; there were no regional wall motion abnormalities. - Left atrium: The atrium was mildly dilated. - Atrial septum: No defect or patent foramen ovale was identified. - Pulmonary arteries: PA peak pressure: 54mm Hg (S).  Lexiscan nuclear stress test: 06/30/2015  Nuclear stress EF: 55%.  There was no ST  segment deviation noted during stress.  Defect 1: There is a small defect of mild severity present in the apical anterior location. This may represent distal LAD disease vs. apical thinning. There is no evidence of reversible ischemia  This is a low risk study.    ASSESSMENT AND PLAN:  1. Coronary artery disease  - known LAD disease and prior to myocardial infarctions, Normal stress test on 06/30/2015. We'll continue aspirin, atorvastatin, Dennis April and metoprolol.   2. Hyperlipidemia  - on atorvastatin 40 mg daily, her most recent lipid panel was excellent decrease atorvastatin 20 mg daily..   3. Hypertension  - well controlled , continue metoprolol and benazepril.  Follow up in 6 months. Lipids, CMP, CBC and TSH prior to that appointment.  Signed, Dorothy Spark, MD  07/22/2015 8:50 AM    [facility] [address], [place], [place]  [address] Phone: [phone]; Fax: [phone]

## 2015-07-23 ENCOUNTER — Encounter: Payer: Self-pay | Admitting: Pulmonary Disease

## 2015-07-23 ENCOUNTER — Ambulatory Visit (INDEPENDENT_AMBULATORY_CARE_PROVIDER_SITE_OTHER): Payer: Medicare Other | Admitting: Pulmonary Disease

## 2015-07-23 VITALS — BP 132/84 | HR 82 | Ht 60.0 in | Wt 164.0 lb

## 2015-07-23 DIAGNOSIS — J45901 Unspecified asthma with (acute) exacerbation: Secondary | ICD-10-CM | POA: Diagnosis not present

## 2015-07-23 DIAGNOSIS — R0600 Dyspnea, unspecified: Secondary | ICD-10-CM | POA: Insufficient documentation

## 2015-07-23 NOTE — Progress Notes (Signed)
Subjective:    Patient ID: Hannah Scott, female    DOB: Jun 09, 1950, 65 y.o.   MRN: [id number]  Synopsis: Former patient of Dr. Linna Darner who has asthma. Echocardiogram in 2013 showed a normal LVEF, PA pressure slightly elevated at 35 mmHg  HPI Chief Complaint  Patient presents with  . Advice Only    referred by Dr. Quay Burow for asthma.      Hannah Scott is here to see me to establish care for asthma.  She was hospitalized in December 2016 for an asthma flare up and she was referred here for further evaluation.  She says that she has had asthma since she was 67 months old.  She has struggled with attacks of her asthma in the winter when she gets a virus of some sort.  However, most of the year she will have no problems with dyspnea and wheezing.  With some exception though she notes that in the last few months she has been more short of breath than typical.  She isn't sure if she just haven't recovered from the flare she had in asthma.  She is not still coughing since that flare up and she is not bringing up mucus now.  She is no longer wheezing.  She notes that her dyspnea wasn't too bad prior to the episode in 03/2015.  She hasn't been exercising much due to back pain.  She has severe scoliosis and cna't do too much.  She will try to ride her stationary bike.    She says that she had lung function testing years ago.    She has been on Breo for about 2 years now, no recent changes to her medicines.  She had been on another inhaler prior that.    She has had two heart attacks, the last one was in 2001.  She saw cardiology yesterday and her recent stress testing was fine.  Her blood pressure has been better controlled recently.   She feels generally weaker, but not specifically in any one muscle group.  Past Medical History  Diagnosis Date  . Asthma     PFTs, February, 2011, moderate obstructive disease with response to bronchodilators, normal lung volumes, moderate reduction in diffusing capacity  .  Obstructive airway disease ([place])   . Hyperlipidemia   . Hypertension   . Hypothyroidism     Patient has had in the past that she does not need treatment  . CAD (coronary artery disease)     90% distal LAD in the past  /   nuclear, 2008, no ischemia, ejection fraction 70%  . Atrial septal aneurysm     Echo, 2008-not noted on 13 echo  . Pinched nerve     lower back  . Kyphoscoliosis   . Shortness of breath   . Endometriosis 1989    RIGHT TUBE  . Endometriosis 1987    LEFT TUBE/OVARY W FOCAL IN-SITU ENDOMETRIAL ADENOCARCINOMA  . Cancer ([place]) 2002    DUCTAL CIS--S/P LUMPECTOMY, RADIATION AND 6 WEEKS OF TAMOXIFEN  . Ejection fraction     EF 60%, echo, October, 2008  . Elevated CPK     January, 2014  . D-dimer, elevated     January, 2014  . Depression   . UTI (lower urinary tract infection)   . GERD (gastroesophageal reflux disease)     occ  . H/O hiatal hernia     ?  [name] Arthritis   . Anemia     hx  Family History  Problem Relation Age of Onset  . Heart attack Father   . Heart disease Father   . Heart failure Mother   . Pneumonia Mother   . Hypertension Mother   . Heart disease Mother   . Stroke Maternal Grandfather   . Hypertension Maternal Grandfather   . Asthma Paternal Uncle     PAT UNCLES  . Heart attack Paternal Uncle      Social History   Social History  . Marital Status: Married    Spouse Name: N/A  . Number of Children: N/A  . Years of Education: N/A   Occupational History  . Not on file.   Social History Main Topics  . Smoking status: Never Smoker   . Smokeless tobacco: Never Used  . Alcohol Use: No  . Drug Use: No  . Sexual Activity: No   Other Topics Concern  . Not on file   Social History Narrative     Allergies  Allergen Reactions  . Fluticasone-Salmeterol Other (See Comments)    REACTION: headache.  She is tolerating Dulera well.  [name] Norvasc [Amlodipine Besylate] Swelling and Other (See Comments)    edema  . Tape Other (See  Comments)    Prefers paper tape     Outpatient Prescriptions Prior to Visit  Medication Sig Dispense Refill  . albuterol (PROVENTIL HFA;VENTOLIN HFA) 108 (90 Base) MCG/ACT inhaler Inhale 1-2 puffs into the lungs every 6 (six) hours as needed for wheezing or shortness of breath.    [name] aspirin EC 325 MG tablet Take 1 tablet (325 mg total) by mouth daily. 30 tablet 0  . atorvastatin (LIPITOR) 20 MG tablet Take 1 tablet (20 mg total) by mouth daily. 90 tablet 3  . benazepril (LOTENSIN) 40 MG tablet Take 20 mg by mouth daily.    . DULoxetine (CYMBALTA) 30 MG capsule Take 1 capsule (30 mg total) by mouth daily. 30 capsule 5  . Fluticasone Furoate-Vilanterol 100-25 MCG/INH AEPB Inhale 1 puff into the lungs daily. 60 each 5  . gabapentin (NEURONTIN) 300 MG capsule Take 1 capsule (300 mg total) by mouth at bedtime. 30 capsule 5  . hydrochlorothiazide (MICROZIDE) 12.5 MG capsule Take 12.5 mg by mouth as directed.     [name] HYDROcodone-acetaminophen (NORCO) 10-325 MG tablet Take 1 tablet by mouth every 6 (six) hours as needed. 100 tablet 0  . ipratropium-albuterol (DUONEB) 0.5-2.5 (3) MG/3ML SOLN Take 3 mLs by nebulization as directed.     [name] levothyroxine (SYNTHROID, LEVOTHROID) 75 MCG tablet Take 1 tablet (75 mcg total) by mouth daily before breakfast. 30 tablet 11  . metoprolol (LOPRESSOR) 50 MG tablet Take 50 mg by mouth 2 (two) times daily.     No facility-administered medications prior to visit.       Review of Systems  Constitutional: Negative for fever and unexpected weight change.  HENT: Positive for postnasal drip. Negative for congestion, dental problem, ear pain, nosebleeds, rhinorrhea, sinus pressure, sneezing, sore throat and trouble swallowing.   Eyes: Negative for redness and itching.  Respiratory: Positive for chest tightness, shortness of breath and wheezing. Negative for cough.   Cardiovascular: Negative for palpitations and leg swelling.  Gastrointestinal: Negative for nausea and  vomiting.  Genitourinary: Negative for dysuria.  Musculoskeletal: Negative for joint swelling.  Skin: Negative for rash.  Neurological: Negative for headaches.  Hematological: Does not bruise/bleed easily.  Psychiatric/Behavioral: Negative for dysphoric mood. The patient is not nervous/anxious.        Objective:  Physical Exam  Filed Vitals:   07/23/15 1528  BP: 132/84  Pulse: 82  Height: 5' (1.524 m)  Weight: 164 lb (74.39 kg)  SpO2: 95%   RA  Gen: frail, chronically ill appearing, obese, no acute distress HENT: NCAT, OP clear, neck supple without masses Eyes: PERRL, EOMi Lymph: no cervical lymphadenopathy PULM: few crackles bases, no wheezing CV: RRR, no mgr, no JVD GI: BS+, soft, nontender, no hsm Derm: no rash or skin breakdown MSK: normal bulk and tone Neuro: A&Ox4, CN II-XII intact, strength 5/5 in all 4 extremities Psyche: normal mood and affect    CBC    Component Value Date/Time   WBC 6.2 04/25/2015 0639   WBC 5.4 03/24/2009 0941   RBC 4.22 04/25/2015 0639   RBC 4.13 03/24/2009 0941   HGB 11.8* 04/25/2015 0639   HGB 13.1 03/24/2009 0941   HCT 39.2 04/25/2015 0639   HCT 39.5 03/24/2009 0941   PLT 259 04/25/2015 0639   PLT 323 03/24/2009 0941   MCV 92.9 04/25/2015 0639   MCV 95.5 03/24/2009 0941   MCH 28.0 04/25/2015 0639   MCH 31.8 03/24/2009 0941   MCHC 30.1 04/25/2015 0639   MCHC 33.3 03/24/2009 0941   RDW 14.1 04/25/2015 0639   RDW 13.8 03/24/2009 0941   LYMPHSABS 1.1 09/17/2014 0806   LYMPHSABS 1.5 03/24/2009 0941   MONOABS 0.5 09/17/2014 0806   MONOABS 0.5 03/24/2009 0941   EOSABS 0.6 09/17/2014 0806   EOSABS 0.5 03/24/2009 0941   BASOSABS 0.0 09/17/2014 0806   BASOSABS 0.0 03/24/2009 0941   December 2016 chest x-ray images personally reviewed showing low lung volumes, cardiomegaly, question atelectasis left base      Assessment & Plan:  Asthma exacerbation She had a severe exacerbation of asthma in December 2016 due to influenza.  Since then she has continued to struggle with shortness of breath but she has not had evidence of active asthma in that she is no longer coughing, wheezing, or producing mucus. Generally she is generally weak, but there is no focal muscle weakness. I believe that her dyspnea is primarily related to generalized weakness and deconditioning at this point. Her chest x-ray from December 2016 did not show evidence of other pulmonary pathology in her lungs are clear today on exam.  Plan: Ambulatory O2 saturation monitoring Simple spirometry Continue Breo for now Focus our care on encouraging her to exercise more, make a referral to pulmonary rehabilitation though she may not be able to do this for financial reasons If she cannot participate in pulmonary rehabilitation and I have encouraged her to exercise more at home Follow-up 3 months  Dyspnea As above     Current outpatient prescriptions:  .  albuterol (PROVENTIL HFA;VENTOLIN HFA) 108 (90 Base) MCG/ACT inhaler, Inhale 1-2 puffs into the lungs every 6 (six) hours as needed for wheezing or shortness of breath., Disp: , Rfl:  .  aspirin EC 325 MG tablet, Take 1 tablet (325 mg total) by mouth daily., Disp: 30 tablet, Rfl: 0 .  atorvastatin (LIPITOR) 20 MG tablet, Take 1 tablet (20 mg total) by mouth daily., Disp: 90 tablet, Rfl: 3 .  benazepril (LOTENSIN) 40 MG tablet, Take 20 mg by mouth daily., Disp: , Rfl:  .  DULoxetine (CYMBALTA) 30 MG capsule, Take 1 capsule (30 mg total) by mouth daily., Disp: 30 capsule, Rfl: 5 .  Fluticasone Furoate-Vilanterol 100-25 MCG/INH AEPB, Inhale 1 puff into the lungs daily., Disp: 60 each, Rfl: 5 .  gabapentin (NEURONTIN) 300  MG capsule, Take 1 capsule (300 mg total) by mouth at bedtime., Disp: 30 capsule, Rfl: 5 .  hydrochlorothiazide (MICROZIDE) 12.5 MG capsule, Take 12.5 mg by mouth as directed. , Disp: , Rfl:  .  HYDROcodone-acetaminophen (NORCO) 10-325 MG tablet, Take 1 tablet by mouth every 6 (six) hours as  needed., Disp: 100 tablet, Rfl: 0 .  ipratropium-albuterol (DUONEB) 0.5-2.5 (3) MG/3ML SOLN, Take 3 mLs by nebulization as directed. , Disp: , Rfl:  .  levothyroxine (SYNTHROID, LEVOTHROID) 75 MCG tablet, Take 1 tablet (75 mcg total) by mouth daily before breakfast., Disp: 30 tablet, Rfl: 11 .  metoprolol (LOPRESSOR) 50 MG tablet, Take 50 mg by mouth 2 (two) times daily., Disp: , Rfl:

## 2015-07-23 NOTE — Assessment & Plan Note (Signed)
As above.

## 2015-07-23 NOTE — Addendum Note (Signed)
Addended by: Len Blalock on: 07/23/2015 04:11 PM   Modules accepted: Orders

## 2015-07-23 NOTE — Patient Instructions (Signed)
I want you to try to exercise more. We will make a referral to pulmonary rehabilitation, but if it is something that you cannot afford then I want you to try to use your exercise bike at home more frequently Keep Taking Breo as you're doing We will see you back in 3 months or sooner if needed

## 2015-07-23 NOTE — Assessment & Plan Note (Signed)
She had a severe exacerbation of asthma in December 2016 due to influenza. Since then she has continued to struggle with shortness of breath but she has not had evidence of active asthma in that she is no longer coughing, wheezing, or producing mucus. Generally she is generally weak, but there is no focal muscle weakness. I believe that her dyspnea is primarily related to generalized weakness and deconditioning at this point. Her chest x-ray from December 2016 did not show evidence of other pulmonary pathology in her lungs are clear today on exam.  Plan: Ambulatory O2 saturation monitoring Simple spirometry Continue Breo for now Focus our care on encouraging her to exercise more, make a referral to pulmonary rehabilitation though she may not be able to do this for financial reasons If she cannot participate in pulmonary rehabilitation and I have encouraged her to exercise more at home Follow-up 3 months

## 2015-07-23 NOTE — Addendum Note (Signed)
Addended by: Len Blalock on: 07/23/2015 05:01 PM   Modules accepted: Orders

## 2015-07-30 ENCOUNTER — Encounter: Payer: Medicare Other | Attending: Physical Medicine & Rehabilitation | Admitting: Registered Nurse

## 2015-07-30 ENCOUNTER — Encounter: Payer: Self-pay | Admitting: Registered Nurse

## 2015-07-30 VITALS — BP 155/80 | HR 72 | Resp 14

## 2015-07-30 DIAGNOSIS — M961 Postlaminectomy syndrome, not elsewhere classified: Secondary | ICD-10-CM | POA: Diagnosis not present

## 2015-07-30 DIAGNOSIS — M4806 Spinal stenosis, lumbar region: Secondary | ICD-10-CM | POA: Diagnosis not present

## 2015-07-30 DIAGNOSIS — M1611 Unilateral primary osteoarthritis, right hip: Secondary | ICD-10-CM | POA: Diagnosis not present

## 2015-07-30 DIAGNOSIS — M4126 Other idiopathic scoliosis, lumbar region: Secondary | ICD-10-CM | POA: Insufficient documentation

## 2015-07-30 DIAGNOSIS — Z79899 Other long term (current) drug therapy: Secondary | ICD-10-CM

## 2015-07-30 DIAGNOSIS — G894 Chronic pain syndrome: Secondary | ICD-10-CM

## 2015-07-30 DIAGNOSIS — M24551 Contracture, right hip: Secondary | ICD-10-CM

## 2015-07-30 DIAGNOSIS — Z5181 Encounter for therapeutic drug level monitoring: Secondary | ICD-10-CM

## 2015-07-30 DIAGNOSIS — M48062 Spinal stenosis, lumbar region with neurogenic claudication: Secondary | ICD-10-CM

## 2015-07-30 DIAGNOSIS — M419 Scoliosis, unspecified: Secondary | ICD-10-CM

## 2015-07-30 MED ORDER — HYDROCODONE-ACETAMINOPHEN 10-325 MG PO TABS: 1.0000 | ORAL_TABLET | Freq: Four times a day (QID) | ORAL | Status: DC | PRN

## 2015-07-30 NOTE — Progress Notes (Signed)
Subjective:    Patient ID: Hannah Scott, female    DOB: 1951-04-11, 65 y.o.   MRN: [id number]  HPI: Hannah Scott is a 64 year old female who returns for follow up for chronic pain and medication refill. She states her pain is located in her lower back and right hip. She rates her pain 6. Her current exercise regime is walking for short distances using her cadillac walker or cane in her home and using her stationary bicycle 3- 4 times a week for 5-10 minutes..  Pain Inventory Average Pain 7 Pain Right Now 6 My pain is constant, burning, tingling and aching  In the last 24 hours, has pain interfered with the following? General activity 10 Relation with others 10 Enjoyment of life 10 What TIME of day is your pain at its worst? daytime, evening, night Sleep (in general) Poor  Pain is worse with: walking and standing Pain improves with: rest and medication Relief from Meds: 4  Mobility Do you have any goals in this area?  no  Function disabled: date disabled 2008 I need assistance with the following:  household duties and shopping Do you have any goals in this area?  no  Neuro/Psych weakness numbness tingling trouble walking  Prior Studies Any changes since last visit?  no  Physicians involved in your care Any changes since last visit?  no   Family History  Problem Relation Age of Onset  . Heart attack Father   . Heart disease Father   . Heart failure Mother   . Pneumonia Mother   . Hypertension Mother   . Heart disease Mother   . Stroke Maternal Grandfather   . Hypertension Maternal Grandfather   . Asthma Paternal Uncle     PAT UNCLES  . Heart attack Paternal Uncle    Social History   Social History  . Marital Status: Married    Spouse Name: N/A  . Number of Children: N/A  . Years of Education: N/A   Social History Main Topics  . Smoking status: Never Smoker   . Smokeless tobacco: Never Used  . Alcohol Use: No  . Drug Use: No  . Sexual Activity:  No   Other Topics Concern  . None   Social History Narrative   Past Surgical History  Procedure Laterality Date  . Cataract extraction Bilateral 01,03  . Cardiovascular stress test  09/07/05    Nuclear, was negative  . Breast lumpectomy  2003    radiation on right  . Appendectomy  1987    AT [facility]  . Total abdominal hysterectomy  1987    LSO, APPENDECTOMY  . Pelvic laparoscopy  1989    RSO, LYSIS OF ADHESIONS  . Oophorectomy      LSO -RSO  . Back surgery      Fusion  . Ankle surgery Left     ligament  . Total hip arthroplasty Right 10/28/2013    dr Lorin Mercy  . Total hip arthroplasty Right 10/28/2013    Procedure: TOTAL HIP ARTHROPLASTY ANTERIOR APPROACH;  Surgeon: Marybelle Killings, MD;  Location: [facility];  Service: Orthopedics;  Laterality: Right;  Right Total Hip Arthroplasty, Direct Anterior Approach   Past Medical History  Diagnosis Date  . Asthma     PFTs, February, 2011, moderate obstructive disease with response to bronchodilators, normal lung volumes, moderate reduction in diffusing capacity  . Obstructive airway disease ([place])   . Hyperlipidemia   . Hypertension   . Hypothyroidism  Patient has had in the past that she does not need treatment  . CAD (coronary artery disease)     90% distal LAD in the past  /   nuclear, 2008, no ischemia, ejection fraction 70%  . Atrial septal aneurysm     Echo, 2008-not noted on 13 echo  . Pinched nerve     lower back  . Kyphoscoliosis   . Shortness of breath   . Endometriosis 1989    RIGHT TUBE  . Endometriosis 1987    LEFT TUBE/OVARY W FOCAL IN-SITU ENDOMETRIAL ADENOCARCINOMA  . Cancer ([place]) 2002    DUCTAL CIS--S/P LUMPECTOMY, RADIATION AND 6 WEEKS OF TAMOXIFEN  . Ejection fraction     EF 60%, echo, October, 2008  . Elevated CPK     January, 2014  . D-dimer, elevated     January, 2014  . Depression   . UTI (lower urinary tract infection)   . GERD (gastroesophageal reflux disease)     occ  . H/O hiatal hernia     ?  [name]  Arthritis   . Anemia     hx   BP 155/80 mmHg  Pulse 72  Resp 14  SpO2 100%  Opioid Risk Score:   Fall Risk Score:  `1  Depression screen PHQ 2/9  Depression screen PHQ 2/9 07/11/2014  Decreased Interest 3  Down, Depressed, Hopeless 2  PHQ - 2 Score 5  Altered sleeping 3  Tired, decreased energy 3  Change in appetite 0  Feeling bad or failure about yourself  0  Trouble concentrating 0  Moving slowly or fidgety/restless 0  Suicidal thoughts 0  PHQ-9 Score 11     Review of Systems  Musculoskeletal: Positive for gait problem.  Neurological: Positive for weakness and numbness.       Tingling   All other systems reviewed and are negative.      Objective:   Physical Exam  Constitutional: She is oriented to person, place, and time. She appears well-developed and well-nourished.  HENT:  Head: Normocephalic and atraumatic.  Neck: Normal range of motion. Neck supple.  Cardiovascular: Normal rate and regular rhythm.   Pulmonary/Chest: Effort normal and breath sounds normal.  Musculoskeletal:  Normal Muscle Bulk and Muscle Testing Reveals: Upper Extremities: Full ROM and Muscle Strength 5/5 Lumbar Scoliosis Lumbar Paraspinal Tenderness: L-3- L-5 Right Greater Trochanteric Tenderness Lower Extremities: Full ROM and Muscle Strength 5/5 Arises from chair with ease using walker for support Trendelenburg  Gait    Neurological: She is alert and oriented to person, place, and time.  Skin: Skin is warm and dry.  Psychiatric: She has a normal mood and affect.  Nursing note and vitals reviewed.         Assessment & Plan:  1 Lumbar post laminectomy syndrome, s/p lumbar fusion:  Refilled: Hydrocodone 10/325mg  one tablet every 6 hours #100. We will continue the opioid monitoring program, this consists of regular clinic visits, examinations, urine drug screen, pill counts as well as use of [place] Controlled Substance Reporting system. 2. Right Hip OA: S/P Right Hip  Replacement 10/28/2013. 3.Depression: No Longer Taking Zoloft. Has Family Support and Friends. Counseling with Doristine Bosworth. 4. Right Greater Trochanteric Tenderness: Continue with Ice and Heat Therapy.   20 minutes of face to face patient care time was spent during this visit. All questions were encouraged and answered.  F/U in 1 month

## 2015-08-11 ENCOUNTER — Other Ambulatory Visit: Payer: Self-pay | Admitting: Emergency Medicine

## 2015-08-11 MED ORDER — METOPROLOL TARTRATE 50 MG PO TABS: 50.0000 mg | ORAL_TABLET | Freq: Two times a day (BID) | ORAL | Status: DC

## 2015-08-20 ENCOUNTER — Telehealth: Payer: Self-pay | Admitting: Internal Medicine

## 2015-08-20 MED ORDER — BENAZEPRIL HCL 40 MG PO TABS: 20.0000 mg | ORAL_TABLET | Freq: Every day | ORAL | Status: DC

## 2015-08-20 NOTE — Telephone Encounter (Signed)
Patient is requesting benazepril to be sent to [facility]. [place] [phone].  Patient only has one pill left.

## 2015-08-21 ENCOUNTER — Ambulatory Visit (INDEPENDENT_AMBULATORY_CARE_PROVIDER_SITE_OTHER): Payer: Medicare Other | Admitting: Women's Health

## 2015-08-21 ENCOUNTER — Encounter: Payer: Self-pay | Admitting: Women's Health

## 2015-08-21 VITALS — BP 122/80 | Ht 60.0 in | Wt 164.0 lb

## 2015-08-21 DIAGNOSIS — R350 Frequency of micturition: Secondary | ICD-10-CM | POA: Diagnosis not present

## 2015-08-21 DIAGNOSIS — N3001 Acute cystitis with hematuria: Secondary | ICD-10-CM | POA: Diagnosis not present

## 2015-08-21 LAB — URINALYSIS W MICROSCOPIC + REFLEX CULTURE
BILIRUBIN URINE: NEGATIVE
CASTS: NONE SEEN [LPF]
Crystals: NONE SEEN [HPF]
Glucose, UA: NEGATIVE
KETONES UR: NEGATIVE
Nitrite: NEGATIVE
Specific Gravity, Urine: 1.015 (ref 1.001–1.035)
Yeast: NONE SEEN [HPF]
pH: 8 (ref 5.0–8.0)

## 2015-08-21 MED ORDER — CIPROFLOXACIN HCL 250 MG PO TABS: 250.0000 mg | ORAL_TABLET | Freq: Two times a day (BID) | ORAL | Status: DC

## 2015-08-21 NOTE — Progress Notes (Signed)
Patient ID: Hannah Scott, female   DOB: 04/27/1950, 65 y.o.   MRN: [id number] Presents with complaint of increased urinary frequency, urgency and burning for 1 day. Denies vaginal discharge, abdominal pain or fever. History of a hysterectomy on no HRT. 2002 breast cancer survivor. Walks with a walker, severe scoliosis.]  Exam: Appears well. No CVAT. UA: +3 blood, +2 leukocytes, packed WBCs, packed RBCs, many bacteria  UTI with hematuria  Plan: Cipro 250 twice daily for 3 days prescription, proper use given and reviewed. Return to office in 2 weeks for test of cure UA and resolution of hematuria.

## 2015-08-21 NOTE — Patient Instructions (Signed)

## 2015-08-23 LAB — URINE CULTURE: Colony Count: >=100000

## 2015-08-28 IMAGING — CR DG CHEST 2V
2 series · 2 of 2 positions shown · non-contrast
Comparison: 05/02/2012

CLINICAL DATA: Preop hip surgery

EXAM:
CHEST  2 VIEW

[w chest lat]
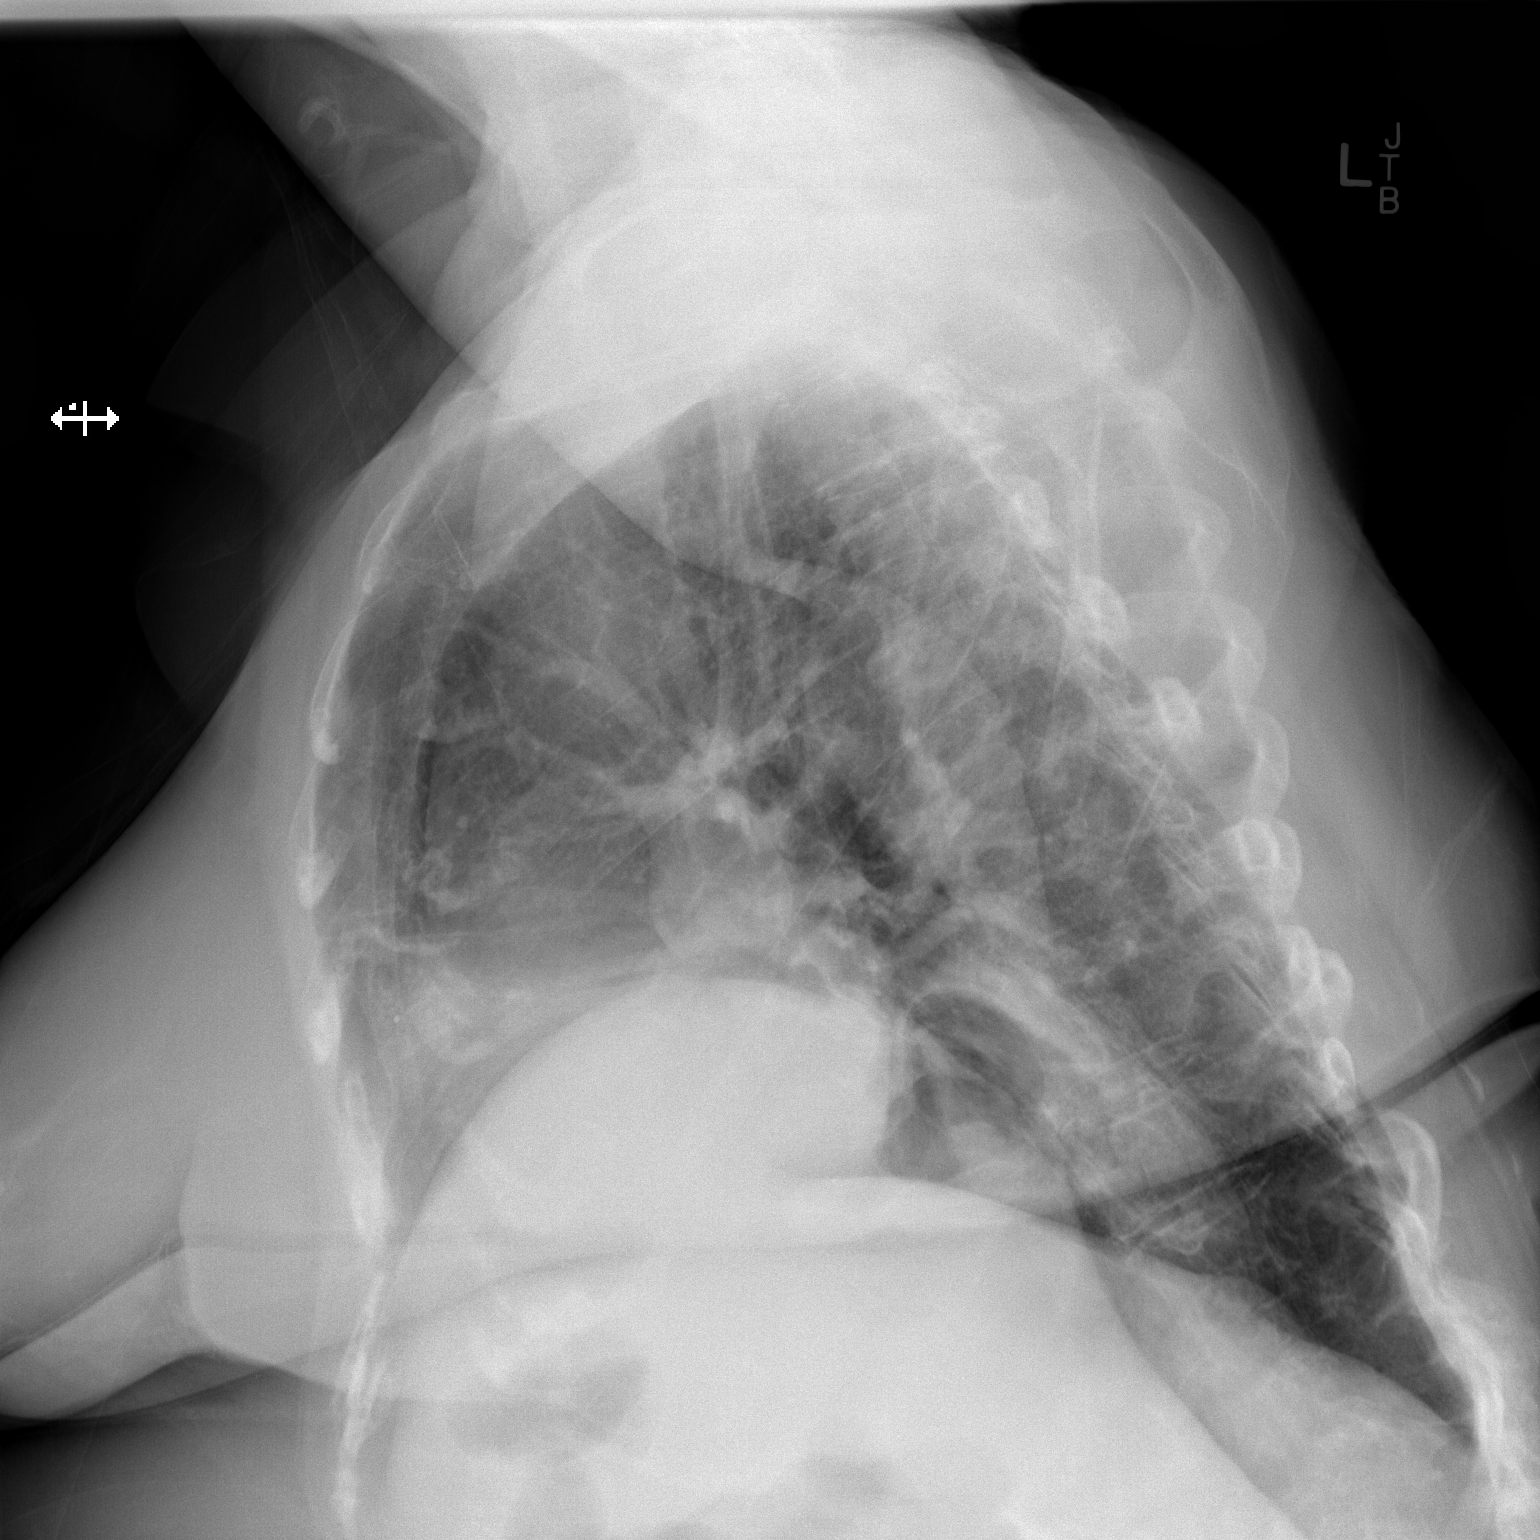

[x chest ap]
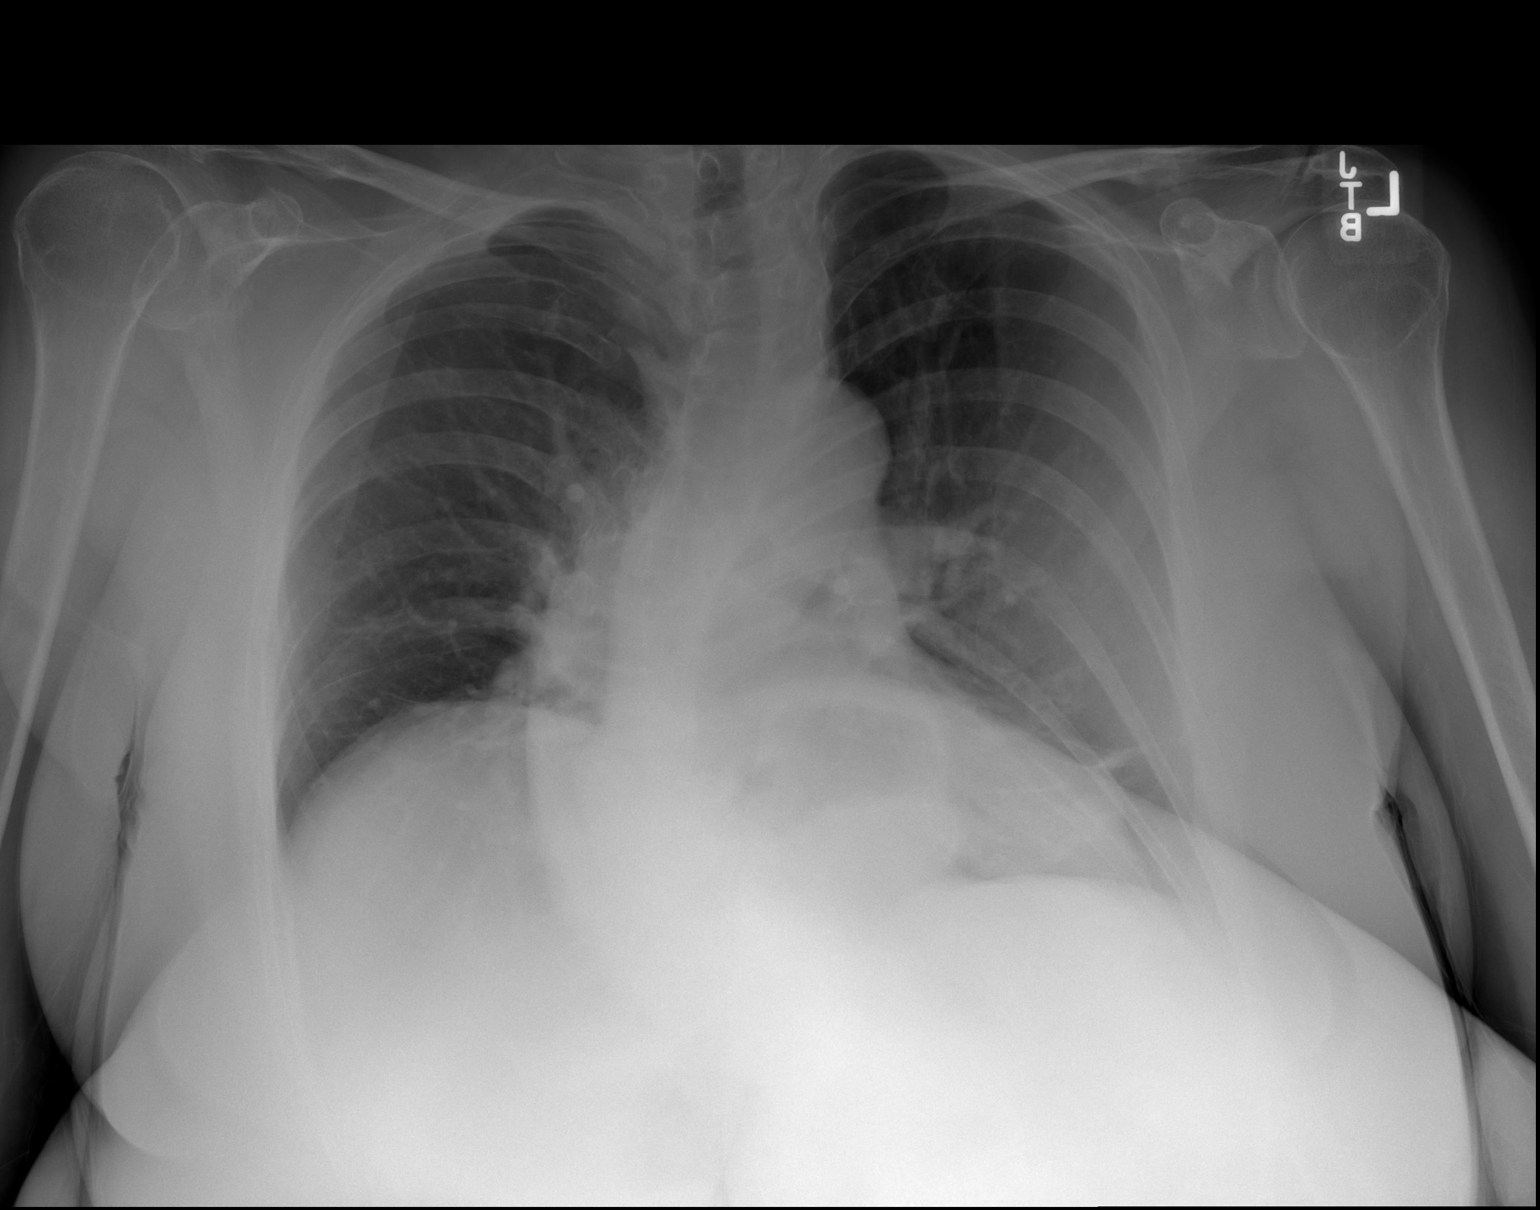

[2 of 2 positions shown; findings below may reference images not displayed]

FINDINGS: Eventration right hemidiaphragm unchanged. Right lower lobe
atelectasis unchanged. Improvement in left lower lobe atelectasis.
Moderate hiatal hernia unchanged

Negative for heart failure or edema.  No pleural effusion.
IMPRESSION: Bibasilar atelectasis, with some improvement on the left.

Hiatal hernia.

## 2015-08-31 ENCOUNTER — Other Ambulatory Visit: Payer: Medicare Other

## 2015-09-01 ENCOUNTER — Encounter: Payer: Medicare Other | Attending: Physical Medicine & Rehabilitation | Admitting: Registered Nurse

## 2015-09-01 ENCOUNTER — Other Ambulatory Visit: Payer: Medicare Other

## 2015-09-01 ENCOUNTER — Encounter: Payer: Self-pay | Admitting: Registered Nurse

## 2015-09-01 VITALS — BP 156/94 | HR 68 | Resp 15

## 2015-09-01 DIAGNOSIS — G894 Chronic pain syndrome: Secondary | ICD-10-CM | POA: Diagnosis not present

## 2015-09-01 DIAGNOSIS — M4806 Spinal stenosis, lumbar region: Secondary | ICD-10-CM | POA: Insufficient documentation

## 2015-09-01 DIAGNOSIS — Z79899 Other long term (current) drug therapy: Secondary | ICD-10-CM

## 2015-09-01 DIAGNOSIS — M4126 Other idiopathic scoliosis, lumbar region: Secondary | ICD-10-CM

## 2015-09-01 DIAGNOSIS — Z5181 Encounter for therapeutic drug level monitoring: Secondary | ICD-10-CM | POA: Diagnosis not present

## 2015-09-01 DIAGNOSIS — M961 Postlaminectomy syndrome, not elsewhere classified: Secondary | ICD-10-CM | POA: Insufficient documentation

## 2015-09-01 DIAGNOSIS — M24551 Contracture, right hip: Secondary | ICD-10-CM

## 2015-09-01 DIAGNOSIS — M1611 Unilateral primary osteoarthritis, right hip: Secondary | ICD-10-CM | POA: Insufficient documentation

## 2015-09-01 DIAGNOSIS — M48062 Spinal stenosis, lumbar region with neurogenic claudication: Secondary | ICD-10-CM

## 2015-09-01 DIAGNOSIS — N3001 Acute cystitis with hematuria: Secondary | ICD-10-CM | POA: Diagnosis not present

## 2015-09-01 DIAGNOSIS — M419 Scoliosis, unspecified: Secondary | ICD-10-CM

## 2015-09-01 MED ORDER — HYDROCODONE-ACETAMINOPHEN 10-325 MG PO TABS: 1.0000 | ORAL_TABLET | Freq: Four times a day (QID) | ORAL | Status: DC | PRN

## 2015-09-01 NOTE — Progress Notes (Signed)
Subjective:    Patient ID: Hannah Scott, female    DOB: 27-Aug-1950, 65 y.o.   MRN: [id number]  HPI: [name] is a 65 year old female who returns for follow up for chronic pain and medication refill. She states her pain is located in her lower back mainly right side and right hip. She rates her pain 7. Her current exercise regime is walking for short distances using her cadillac walker or cane in her home and using her stationary bicycle 3- 4 times a week for 5-10 minutes..  Pain Inventory Average Pain 7 Pain Right Now 7 My pain is constant, burning, tingling and aching  In the last 24 hours, has pain interfered with the following? General activity 10 Relation with others 10 Enjoyment of life 10 What TIME of day is your pain at its worst? morning, daytime, evening, night Sleep (in general) Poor  Pain is worse with: walking and standing Pain improves with: rest and medication Relief from Meds: 5  Mobility use a cane use a walker ability to climb steps?  yes do you drive?  yes  Function disabled: date disabled NA  Neuro/Psych weakness numbness tingling trouble walking  Prior Studies Any changes since last visit?  no  Physicians involved in your care Any changes since last visit?  no   Family History  Problem Relation Age of Onset  . Heart attack Father   . Heart disease Father   . Heart failure Mother   . Pneumonia Mother   . Hypertension Mother   . Heart disease Mother   . Stroke Maternal Grandfather   . Hypertension Maternal Grandfather   . Asthma Paternal Uncle     PAT UNCLES  . Heart attack Paternal Uncle    Social History   Social History  . Marital Status: Married    Spouse Name: N/A  . Number of Children: N/A  . Years of Education: N/A   Social History Main Topics  . Smoking status: Never Smoker   . Smokeless tobacco: Never Used  . Alcohol Use: No  . Drug Use: No  . Sexual Activity: No   Other Topics Concern  . None   Social  History Narrative   Past Surgical History  Procedure Laterality Date  . Cataract extraction Bilateral 01,03  . Cardiovascular stress test  09/07/05    Nuclear, was negative  . Breast lumpectomy  2003    radiation on right  . Appendectomy  1987    AT [facility]  . Total abdominal hysterectomy  1987    LSO, APPENDECTOMY  . Pelvic laparoscopy  1989    RSO, LYSIS OF ADHESIONS  . Oophorectomy      LSO -RSO  . Back surgery      Fusion  . Ankle surgery Left     ligament  . Total hip arthroplasty Right 10/28/2013    dr Lorin Mercy  . Total hip arthroplasty Right 10/28/2013    Procedure: TOTAL HIP ARTHROPLASTY ANTERIOR APPROACH;  Surgeon: Marybelle Killings, MD;  Location: [place];  Service: Orthopedics;  Laterality: Right;  Right Total Hip Arthroplasty, Direct Anterior Approach   Past Medical History  Diagnosis Date  . Asthma     PFTs, February, 2011, moderate obstructive disease with response to bronchodilators, normal lung volumes, moderate reduction in diffusing capacity  . Obstructive airway disease ([place])   . Hyperlipidemia   . Hypertension   . Hypothyroidism     Patient has had in the past  that she does not need treatment  . CAD (coronary artery disease)     90% distal LAD in the past  /   nuclear, 2008, no ischemia, ejection fraction 70%  . Atrial septal aneurysm     Echo, 2008-not noted on 13 echo  . Pinched nerve     lower back  . Kyphoscoliosis   . Shortness of breath   . Endometriosis 1989    RIGHT TUBE  . Endometriosis 1987    LEFT TUBE/OVARY W FOCAL IN-SITU ENDOMETRIAL ADENOCARCINOMA  . Cancer ([place]) 2002    DUCTAL CIS--S/P LUMPECTOMY, RADIATION AND 6 WEEKS OF TAMOXIFEN  . Ejection fraction     EF 60%, echo, October, 2008  . Elevated CPK     January, 2014  . D-dimer, elevated     January, 2014  . Depression   . UTI (lower urinary tract infection)   . GERD (gastroesophageal reflux disease)     occ  . H/O hiatal hernia     ?  [name] Arthritis   . Anemia     hx   BP 156/94 mmHg   Pulse 68  Resp 15  SpO2 97%  Opioid Risk Score:   Fall Risk Score:  `1  Depression screen PHQ 2/9  Depression screen PHQ 2/9 07/11/2014  Decreased Interest 3  Down, Depressed, Hopeless 2  PHQ - 2 Score 5  Altered sleeping 3  Tired, decreased energy 3  Change in appetite 0  Feeling bad or failure about yourself  0  Trouble concentrating 0  Moving slowly or fidgety/restless 0  Suicidal thoughts 0  PHQ-9 Score 11     Review of Systems  Musculoskeletal: Positive for gait problem.  Neurological: Positive for weakness and numbness.       Tingling   All other systems reviewed and are negative.      Objective:   Physical Exam  Constitutional: She is oriented to person, place, and time. She appears well-developed and well-nourished.  HENT:  Head: Normocephalic and atraumatic.  Neck: Normal range of motion. Neck supple.  Cardiovascular: Normal rate and regular rhythm.   Pulmonary/Chest: Effort normal and breath sounds normal.  Musculoskeletal:  Normal Muscle Bulk and Muscle Testing Reveals: Upper Extremities: Full ROM and Muscle Strength 5/5 Lumbar Paraspinal Tenderness: L-3-L-5 Right Greater Trochanteric Tenderness Lower Extremities: Full ROM and Muscle Strength 5/5 Arises from chair with ease Narrow based Gait  Neurological: She is alert and oriented to person, place, and time.  Skin: Skin is warm and dry.  Psychiatric: She has a normal mood and affect.  Nursing note and vitals reviewed.         Assessment & Plan:  1 Lumbar post laminectomy syndrome, s/p lumbar fusion:  Refilled: Hydrocodone 10/325mg  one tablet every 6 hours #100. We will continue the opioid monitoring program, this consists of regular clinic visits, examinations, urine drug screen, pill counts as well as use of [place] Controlled Substance Reporting system. 2. Right Hip OA: S/P Right Hip Replacement 10/28/2013. 3.Depression: No Longer Taking Zoloft. Has Family Support and Friends.  Counseling with Doristine Bosworth. 4. Right Greater Trochanteric Tenderness: Continue with Ice and Heat Therapy.   20 minutes of face to face patient care time was spent during this visit. All questions were encouraged and answered.  F/U in 1 month

## 2015-09-02 LAB — URINALYSIS W MICROSCOPIC + REFLEX CULTURE
Bacteria, UA: NONE SEEN [HPF]
Bilirubin Urine: NEGATIVE
CASTS: NONE SEEN [LPF]
CRYSTALS: NONE SEEN [HPF]
Glucose, UA: NEGATIVE
HGB URINE DIPSTICK: NEGATIVE
KETONES UR: NEGATIVE
Nitrite: NEGATIVE
PH: 7 (ref 5.0–8.0)
Protein, ur: NEGATIVE
Specific Gravity, Urine: 1.021 (ref 1.001–1.035)
Yeast: NONE SEEN [HPF]

## 2015-09-03 LAB — URINE CULTURE: Colony Count: 30000

## 2015-09-14 ENCOUNTER — Other Ambulatory Visit: Payer: Self-pay | Admitting: *Deleted

## 2015-09-14 MED ORDER — GABAPENTIN 300 MG PO CAPS: 300.0000 mg | ORAL_CAPSULE | Freq: Every day | ORAL | Status: DC

## 2015-09-28 ENCOUNTER — Encounter: Payer: Self-pay | Admitting: Physical Medicine & Rehabilitation

## 2015-09-28 ENCOUNTER — Encounter: Payer: Medicare Other | Attending: Physical Medicine & Rehabilitation

## 2015-09-28 ENCOUNTER — Ambulatory Visit (HOSPITAL_BASED_OUTPATIENT_CLINIC_OR_DEPARTMENT_OTHER): Payer: Medicare Other | Admitting: Physical Medicine & Rehabilitation

## 2015-09-28 VITALS — BP 144/80 | HR 71 | Resp 14

## 2015-09-28 DIAGNOSIS — M4806 Spinal stenosis, lumbar region: Secondary | ICD-10-CM | POA: Insufficient documentation

## 2015-09-28 DIAGNOSIS — M1611 Unilateral primary osteoarthritis, right hip: Secondary | ICD-10-CM | POA: Insufficient documentation

## 2015-09-28 DIAGNOSIS — Z79899 Other long term (current) drug therapy: Secondary | ICD-10-CM | POA: Diagnosis not present

## 2015-09-28 DIAGNOSIS — M4126 Other idiopathic scoliosis, lumbar region: Secondary | ICD-10-CM

## 2015-09-28 DIAGNOSIS — G894 Chronic pain syndrome: Secondary | ICD-10-CM

## 2015-09-28 DIAGNOSIS — M419 Scoliosis, unspecified: Secondary | ICD-10-CM

## 2015-09-28 DIAGNOSIS — M961 Postlaminectomy syndrome, not elsewhere classified: Secondary | ICD-10-CM

## 2015-09-28 DIAGNOSIS — M48062 Spinal stenosis, lumbar region with neurogenic claudication: Secondary | ICD-10-CM

## 2015-09-28 DIAGNOSIS — Z5181 Encounter for therapeutic drug level monitoring: Secondary | ICD-10-CM

## 2015-09-28 MED ORDER — GABAPENTIN 400 MG PO CAPS: 400.0000 mg | ORAL_CAPSULE | Freq: Every day | ORAL | Status: DC

## 2015-09-28 MED ORDER — HYDROCODONE-ACETAMINOPHEN 10-325 MG PO TABS: 1.0000 | ORAL_TABLET | Freq: Four times a day (QID) | ORAL | Status: DC | PRN

## 2015-09-28 NOTE — Progress Notes (Signed)
Subjective:    Patient ID: Hannah Scott, female    DOB: July 05, 1950, 65 y.o.   MRN: [id number]  HPI   Pain Inventory Average Pain 7 Pain Right Now 7 My pain is constant, burning, tingling and aching  In the last 24 hours, has pain interfered with the following? General activity 10 Relation with others 10 Enjoyment of life 10 What TIME of day is your pain at its worst? all Sleep (in general) Poor  Pain is worse with: walking and standing Pain improves with: rest and medication Relief from Meds: 6  Mobility walk with assistance use a cane ability to climb steps?  yes do you drive?  yes  Function disabled: date disabled . I need assistance with the following:  household duties  Neuro/Psych weakness numbness tingling trouble walking  Prior Studies Any changes since last visit?  no  Physicians involved in your care Any changes since last visit?  no   Family History  Problem Relation Age of Onset  . Heart attack Father   . Heart disease Father   . Heart failure Mother   . Pneumonia Mother   . Hypertension Mother   . Heart disease Mother   . Stroke Maternal Grandfather   . Hypertension Maternal Grandfather   . Asthma Paternal Uncle     PAT UNCLES  . Heart attack Paternal Uncle    Social History   Social History  . Marital Status: Married    Spouse Name: N/A  . Number of Children: N/A  . Years of Education: N/A   Social History Main Topics  . Smoking status: Never Smoker   . Smokeless tobacco: Never Used  . Alcohol Use: No  . Drug Use: No  . Sexual Activity: No   Other Topics Concern  . None   Social History Narrative   Past Surgical History  Procedure Laterality Date  . Cataract extraction Bilateral 01,03  . Cardiovascular stress test  09/07/05    Nuclear, was negative  . Breast lumpectomy  2003    radiation on right  . Appendectomy  1987    AT [facility]  . Total abdominal hysterectomy  1987    LSO, APPENDECTOMY  . Pelvic laparoscopy  1989      RSO, LYSIS OF ADHESIONS  . Oophorectomy      LSO -RSO  . Back surgery      Fusion  . Ankle surgery Left     ligament  . Total hip arthroplasty Right 10/28/2013    dr Lorin Mercy  . Total hip arthroplasty Right 10/28/2013    Procedure: TOTAL HIP ARTHROPLASTY ANTERIOR APPROACH;  Surgeon: Marybelle Killings, MD;  Location: [place];  Service: Orthopedics;  Laterality: Right;  Right Total Hip Arthroplasty, Direct Anterior Approach   Past Medical History  Diagnosis Date  . Asthma     PFTs, February, 2011, moderate obstructive disease with response to bronchodilators, normal lung volumes, moderate reduction in diffusing capacity  . Obstructive airway disease ([place])   . Hyperlipidemia   . Hypertension   . Hypothyroidism     Patient has had in the past that she does not need treatment  . CAD (coronary artery disease)     90% distal LAD in the past  /   nuclear, 2008, no ischemia, ejection fraction 70%  . Atrial septal aneurysm     Echo, 2008-not noted on 13 echo  . Pinched nerve     lower back  . Kyphoscoliosis   . Shortness  of breath   . Endometriosis 1989    RIGHT TUBE  . Endometriosis 1987    LEFT TUBE/OVARY W FOCAL IN-SITU ENDOMETRIAL ADENOCARCINOMA  . Cancer ([place]) 2002    DUCTAL CIS--S/P LUMPECTOMY, RADIATION AND 6 WEEKS OF TAMOXIFEN  . Ejection fraction     EF 60%, echo, October, 2008  . Elevated CPK     January, 2014  . D-dimer, elevated     January, 2014  . Depression   . UTI (lower urinary tract infection)   . GERD (gastroesophageal reflux disease)     occ  . H/O hiatal hernia     ?  [name] Arthritis   . Anemia     hx   BP 144/80 mmHg  Pulse 71  Resp 14  SpO2 96%  Opioid Risk Score:   Fall Risk Score:  `1  Depression screen PHQ 2/9  Depression screen PHQ 2/9 07/11/2014  Decreased Interest 3  Down, Depressed, Hopeless 2  PHQ - 2 Score 5  Altered sleeping 3  Tired, decreased energy 3  Change in appetite 0  Feeling bad or failure about yourself  0  Trouble concentrating  0  Moving slowly or fidgety/restless 0  Suicidal thoughts 0  PHQ-9 Score 11     Review of Systems  All other systems reviewed and are negative.      Objective:   Physical Exam  Constitutional: She is oriented to person, place, and time. She appears well-developed and well-nourished.  HENT:  Head: Normocephalic and atraumatic.  Eyes: Conjunctivae and EOM are normal. Pupils are equal, round, and reactive to light.  Musculoskeletal:       Right hip: She exhibits decreased range of motion. She exhibits no deformity.       Left hip: She exhibits decreased range of motion.       Right knee: She exhibits deformity. She exhibits normal range of motion. No tenderness found.       Left knee: She exhibits deformity. She exhibits normal range of motion. No tenderness found.       Lumbar back: She exhibits decreased range of motion, tenderness, deformity and pain.  Some pain with left hip internal rotation.  Mild bilateral valgus knee deformities and no pain with range of motion or to palpation no evidence of knee effusion  Neurological: She is alert and oriented to person, place, and time.  Psychiatric: She has a normal mood and affect.  Nursing note and vitals reviewed.  Dextroscoliosis thoracolumbar Kyphosis Thoracic spine Right Hip Good ROM with internal and external rotationAs well as flexion. Extension to neutral Sensation is intact to light touch in bilateral L3 L4 L5 and S1 dermatomal distribution       Assessment & Plan:  1 Lumbar post laminectomy syndrome, s/p lumbar fusion:  Refilled: Hydrocodone 10/325mg  one tablet every 6 hours #100. Patient also has radicular pain relieved by gabapentin 300 mg daily at bedtime but feels like she might need a bit higher dose to allow her to sleep better at night. We will continue the opioid monitoring program, this consists of regular clinic visits, examinations, urine drug screen, pill counts as well as use of [place] Controlled  Substance Reporting system. 2. Right Hip OA: S/P Right Hip Replacement 10/28/2013. 3.Bilateral valgus knees nonpainful likely osteoarthritis but asymptomatic at this point

## 2015-09-28 NOTE — Patient Instructions (Addendum)
Please get an ankle weight and strap around your ankle to do exercise every other day  Practice sitting down and standing up  Try the increased dose of gabapentin 400 mg for 1-2 months. If it is still not enough consider going up to 600 mg a day. If you have side effects such as too much drowsiness the day after, consider going back down to 300 mg.

## 2015-10-06 LAB — TOXASSURE SELECT,+ANTIDEPR,UR: PDF: 0

## 2015-10-08 ENCOUNTER — Encounter: Payer: Medicare Other | Admitting: Women's Health

## 2015-10-12 DIAGNOSIS — J449 Chronic obstructive pulmonary disease, unspecified: Secondary | ICD-10-CM | POA: Diagnosis not present

## 2015-10-14 NOTE — Progress Notes (Signed)
Urine drug screen for this encounter is consistent for prescribed medication 

## 2015-10-22 ENCOUNTER — Encounter: Payer: Self-pay | Admitting: Women's Health

## 2015-10-22 ENCOUNTER — Ambulatory Visit (INDEPENDENT_AMBULATORY_CARE_PROVIDER_SITE_OTHER): Payer: Medicare Other | Admitting: Women's Health

## 2015-10-22 VITALS — BP 130/80 | Ht 60.0 in | Wt 173.0 lb

## 2015-10-22 DIAGNOSIS — Z8543 Personal history of malignant neoplasm of ovary: Secondary | ICD-10-CM | POA: Diagnosis not present

## 2015-10-22 DIAGNOSIS — N8110 Cystocele, unspecified: Secondary | ICD-10-CM

## 2015-10-22 DIAGNOSIS — Z01419 Encounter for gynecological examination (general) (routine) without abnormal findings: Secondary | ICD-10-CM | POA: Diagnosis not present

## 2015-10-22 DIAGNOSIS — Z853 Personal history of malignant neoplasm of breast: Secondary | ICD-10-CM | POA: Diagnosis not present

## 2015-10-22 NOTE — Patient Instructions (Signed)
Zostavac and get dexa    Menopause is a normal process in which your reproductive ability comes to an end. This process happens gradually over a span of months to years, usually between the ages of 54 and 61. Menopause is complete when you have missed 12 consecutive menstrual periods. It is important to talk with your health care provider about some of the most common conditions that affect postmenopausal women, such as heart disease, cancer, and bone loss (osteoporosis). Adopting a healthy lifestyle and getting preventive care can help to promote your health and wellness. Those actions can also lower your chances of developing some of these common conditions. WHAT SHOULD I KNOW ABOUT MENOPAUSE? During menopause, you may experience a number of symptoms, such as:  Moderate-to-severe hot flashes.  Night sweats.  Decrease in sex drive.  Mood swings.  Headaches.  Tiredness.  Irritability.  Memory problems.  Insomnia. Choosing to treat or not to treat menopausal changes is an individual decision that you make with your health care provider. WHAT SHOULD I KNOW ABOUT HORMONE REPLACEMENT THERAPY AND SUPPLEMENTS? Hormone therapy products are effective for treating symptoms that are associated with menopause, such as hot flashes and night sweats. Hormone replacement carries certain risks, especially as you become older. If you are thinking about using estrogen or estrogen with progestin treatments, discuss the benefits and risks with your health care provider. WHAT SHOULD I KNOW ABOUT HEART DISEASE AND STROKE? Heart disease, heart attack, and stroke become more likely as you age. This may be due, in part, to the hormonal changes that your body experiences during menopause. These can affect how your body processes dietary fats, triglycerides, and cholesterol. Heart attack and stroke are both medical emergencies. There are many things that you can do to help prevent heart disease and  stroke:  Have your blood pressure checked at least every 1-2 years. High blood pressure causes heart disease and increases the risk of stroke.  If you are 3-81 years old, ask your health care provider if you should take aspirin to prevent a heart attack or a stroke.  Do not use any tobacco products, including cigarettes, chewing tobacco, or electronic cigarettes. If you need help quitting, ask your health care provider.  It is important to eat a healthy diet and maintain a healthy weight.  Be sure to include plenty of vegetables, fruits, low-fat dairy products, and lean protein.  Avoid eating foods that are high in solid fats, added sugars, or salt (sodium).  Get regular exercise. This is one of the most important things that you can do for your health.  Try to exercise for at least 150 minutes each week. The type of exercise that you do should increase your heart rate and make you sweat. This is known as moderate-intensity exercise.  Try to do strengthening exercises at least twice each week. Do these in addition to the moderate-intensity exercise.  Know your numbers.Ask your health care provider to check your cholesterol and your blood glucose. Continue to have your blood tested as directed by your health care provider. WHAT SHOULD I KNOW ABOUT CANCER SCREENING? There are several types of cancer. Take the following steps to reduce your risk and to catch any cancer development as early as possible. Breast Cancer  Practice breast self-awareness.  This means understanding how your breasts normally appear and feel.  It also means doing regular breast self-exams. Let your health care provider know about any changes, no matter how small.  If you are 40  or older, have a clinician do a breast exam (clinical breast exam or CBE) every year. Depending on your age, family history, and medical history, it may be recommended that you also have a yearly breast X-ray (mammogram).  If you have a  family history of breast cancer, talk with your health care provider about genetic screening.  If you are at high risk for breast cancer, talk with your health care provider about having an MRI and a mammogram every year.  Breast cancer (BRCA) gene test is recommended for women who have family members with BRCA-related cancers. Results of the assessment will determine the need for genetic counseling and BRCA1 and for BRCA2 testing. BRCA-related cancers include these types:  Breast. This occurs in males or females.  Ovarian.  Tubal. This may also be called fallopian tube cancer.  Cancer of the abdominal or pelvic lining (peritoneal cancer).  Prostate.  Pancreatic. Cervical, Uterine, and Ovarian Cancer Your health care provider may recommend that you be screened regularly for cancer of the pelvic organs. These include your ovaries, uterus, and vagina. This screening involves a pelvic exam, which includes checking for microscopic changes to the surface of your cervix (Pap test).  For women ages 21-65, health care providers may recommend a pelvic exam and a Pap test every three years. For women ages 73-65, they may recommend the Pap test and pelvic exam, combined with testing for human papilloma virus (HPV), every five years. Some types of HPV increase your risk of cervical cancer. Testing for HPV may also be done on women of any age who have unclear Pap test results.  Other health care providers may not recommend any screening for nonpregnant women who are considered low risk for pelvic cancer and have no symptoms. Ask your health care provider if a screening pelvic exam is right for you.  If you have had past treatment for cervical cancer or a condition that could lead to cancer, you need Pap tests and screening for cancer for at least 20 years after your treatment. If Pap tests have been discontinued for you, your risk factors (such as having a new sexual partner) need to be reassessed to  determine if you should start having screenings again. Some women have medical problems that increase the chance of getting cervical cancer. In these cases, your health care provider may recommend that you have screening and Pap tests more often.  If you have a family history of uterine cancer or ovarian cancer, talk with your health care provider about genetic screening.  If you have vaginal bleeding after reaching menopause, tell your health care provider.  There are currently no reliable tests available to screen for ovarian cancer. Lung Cancer Lung cancer screening is recommended for adults 15-12 years old who are at high risk for lung cancer because of a history of smoking. A yearly low-dose CT scan of the lungs is recommended if you:  Currently smoke.  Have a history of at least 30 pack-years of smoking and you currently smoke or have quit within the past 15 years. A pack-year is smoking an average of one pack of cigarettes per day for one year. Yearly screening should:  Continue until it has been 15 years since you quit.  Stop if you develop a health problem that would prevent you from having lung cancer treatment. Colorectal Cancer  This type of cancer can be detected and can often be prevented.  Routine colorectal cancer screening usually begins at age 26 and continues  through age 62.  If you have risk factors for colon cancer, your health care provider may recommend that you be screened at an earlier age.  If you have a family history of colorectal cancer, talk with your health care provider about genetic screening.  Your health care provider may also recommend using home test kits to check for hidden blood in your stool.  A small camera at the end of a tube can be used to examine your colon directly (sigmoidoscopy or colonoscopy). This is done to check for the earliest forms of colorectal cancer.  Direct examination of the colon should be repeated every 5-10 years until age  49. However, if early forms of precancerous polyps or small growths are found or if you have a family history or genetic risk for colorectal cancer, you may need to be screened more often. Skin Cancer  Check your skin from head to toe regularly.  Monitor any moles. Be sure to tell your health care provider:  About any new moles or changes in moles, especially if there is a change in a mole's shape or color.  If you have a mole that is larger than the size of a pencil eraser.  If any of your family members has a history of skin cancer, especially at a Anissa Abbs age, talk with your health care provider about genetic screening.  Always use sunscreen. Apply sunscreen liberally and repeatedly throughout the day.  Whenever you are outside, protect yourself by wearing long sleeves, pants, a wide-brimmed hat, and sunglasses. WHAT SHOULD I KNOW ABOUT OSTEOPOROSIS? Osteoporosis is a condition in which bone destruction happens more quickly than new bone creation. After menopause, you may be at an increased risk for osteoporosis. To help prevent osteoporosis or the bone fractures that can happen because of osteoporosis, the following is recommended:  If you are 66-77 years old, get at least 1,000 mg of calcium and at least 600 mg of vitamin D per day.  If you are older than age 18 but younger than age 12, get at least 1,200 mg of calcium and at least 600 mg of vitamin D per day.  If you are older than age 78, get at least 1,200 mg of calcium and at least 800 mg of vitamin D per day. Smoking and excessive alcohol intake increase the risk of osteoporosis. Eat foods that are rich in calcium and vitamin D, and do weight-bearing exercises several times each week as directed by your health care provider. WHAT SHOULD I KNOW ABOUT HOW MENOPAUSE AFFECTS [place]? Depression may occur at any age, but it is more common as you become older. Common symptoms of depression include:  Low or sad mood.  Changes  in sleep patterns.  Changes in appetite or eating patterns.  Feeling an overall lack of motivation or enjoyment of activities that you previously enjoyed.  Frequent crying spells. Talk with your health care provider if you think that you are experiencing depression. WHAT SHOULD I KNOW ABOUT IMMUNIZATIONS? It is important that you get and maintain your immunizations. These include:  Tetanus, diphtheria, and pertussis (Tdap) booster vaccine.  Influenza every year before the flu season begins.  Pneumonia vaccine.  Shingles vaccine. Your health care provider may also recommend other immunizations.   This information is not intended to replace advice given to you by your health care provider. Make sure you discuss any questions you have with your health care provider.   Document Released: 06/03/2005 Document Revised: 05/02/2014 Document Reviewed:  12/12/2013 Elsevier Interactive Patient Education [employer].

## 2015-10-22 NOTE — Progress Notes (Signed)
Hannah Scott 1951-02-13 [id number]    History:    Presents for breast and pelvic exam. 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 Normal Paps after. 2002 right breast cancer  carcinoma in situ had a lumpectomy and radiation. Normal mammograms after. History of an [place] and 38. Hypertension/hypercholesterolemia/hyperthyroidism managed by cardiologist and primary care. History of a cystocele without leakage of urine. Walks with a walker due to scoliosis, back pain is biggest problem. 2005 negative colonoscopy.  Past medical history, past surgical history, family history and social history were all reviewed and documented in the EPIC chart. Has 2 sons that live locally that helped. Husband died 2 years ago.  ROS:  A ROS was performed and pertinent positives and negatives are included.  Exam:  Filed Vitals:   10/22/15 1455  BP: 130/80    General appearance:  Scoliosis Thyroid:  Symmetrical, normal in size, without palpable masses or nodularity. Respiratory  Auscultation:  Clear without wheezing or rhonchi Cardiovascular  Auscultation:  Regular rate, without rubs, murmurs or gallops  Edema/varicosities:  Not grossly evident Abdominal  Soft,nontender, without masses, guarding or rebound.  Liver/spleen:  No organomegaly noted  Hernia:  None appreciated  Skin  Inspection:  Grossly normal   Breasts: Examined lying and sitting.     Right: Well-healed scar nipple inverted since lumpectomy    Left: Without masses retractions discharge or adenopathy  Gentitourinary   Inguinal/mons:  Normal without inguinal adenopathy  External genitalia: +2 cystocele  BUS/Urethra/Skene's glands:  Normal  Vagina:  Normal  Cervix:  And uterus absent Adnexa/parametria:     Rt: Without masses or tenderness.   Lt: Without masses or tenderness.  Anus and perineum: Normal  Digital rectal exam: Normal sphincter tone without palpated masses or  tenderness  Assessment/Plan:  65 y.o. W WF G2 P2 for breast and pelvic exam.  1987 TAH with BSO adenocarcinoma left ovary with normal Paps after +2 cystocele asymptomatic 2002 right breast cancer rectal carcinoma in situ lumpectomy with radiation Severe scoliosis-walks with a walker chronic pain on MI 1991 and 98 Hypertension/hypercholesterolemia/hyperthyroidism/depression-primary care and  cardiologist manages labs and meds  Plan: Repeat colonoscopy, will call GI. SBE's, continue annual 3-D screening mammogram. DEXA will schedule at [facility]. Home safety, fall prevention and importance of walking as much as she can reviewed. Calcium rich diet, started to have vitamin D level checked at primary care. Encouraged to drink plenty of water, history of UTIs in the past cystocele without symptoms. Instructed to call if urinary problems/concerns. Pap, reviewed if Pap normal will not need repeat Paps.   Huel Cote [facility], 3:33 PM 10/22/2015

## 2015-10-26 LAB — PAP, TP IMAGING W/ HPV RNA, RFLX HPV TYPE 16,18/45: HPV mRNA, High Risk: NOT DETECTED

## 2015-10-28 ENCOUNTER — Ambulatory Visit: Payer: Medicare Other | Admitting: Pulmonary Disease

## 2015-10-30 ENCOUNTER — Telehealth: Payer: Self-pay | Admitting: Pulmonary Disease

## 2015-10-30 DIAGNOSIS — J45902 Unspecified asthma with status asthmaticus: Secondary | ICD-10-CM

## 2015-10-30 NOTE — Telephone Encounter (Signed)
New order entered. Nothing further needed.  

## 2015-11-02 ENCOUNTER — Telehealth (HOSPITAL_COMMUNITY): Payer: Self-pay | Admitting: *Deleted

## 2015-11-09 ENCOUNTER — Encounter: Payer: Self-pay | Admitting: Registered Nurse

## 2015-11-09 ENCOUNTER — Encounter: Payer: Medicare Other | Attending: Physical Medicine & Rehabilitation | Admitting: Registered Nurse

## 2015-11-09 VITALS — BP 127/80 | HR 63

## 2015-11-09 DIAGNOSIS — M1611 Unilateral primary osteoarthritis, right hip: Secondary | ICD-10-CM | POA: Insufficient documentation

## 2015-11-09 DIAGNOSIS — M961 Postlaminectomy syndrome, not elsewhere classified: Secondary | ICD-10-CM | POA: Diagnosis not present

## 2015-11-09 DIAGNOSIS — M419 Scoliosis, unspecified: Secondary | ICD-10-CM

## 2015-11-09 DIAGNOSIS — G894 Chronic pain syndrome: Secondary | ICD-10-CM

## 2015-11-09 DIAGNOSIS — M4126 Other idiopathic scoliosis, lumbar region: Secondary | ICD-10-CM | POA: Diagnosis not present

## 2015-11-09 DIAGNOSIS — Z79899 Other long term (current) drug therapy: Secondary | ICD-10-CM

## 2015-11-09 DIAGNOSIS — M24551 Contracture, right hip: Secondary | ICD-10-CM | POA: Diagnosis not present

## 2015-11-09 DIAGNOSIS — M4806 Spinal stenosis, lumbar region: Secondary | ICD-10-CM | POA: Insufficient documentation

## 2015-11-09 DIAGNOSIS — M48062 Spinal stenosis, lumbar region with neurogenic claudication: Secondary | ICD-10-CM

## 2015-11-09 DIAGNOSIS — Z5181 Encounter for therapeutic drug level monitoring: Secondary | ICD-10-CM

## 2015-11-09 MED ORDER — HYDROCODONE-ACETAMINOPHEN 10-325 MG PO TABS: 1.0000 | ORAL_TABLET | Freq: Four times a day (QID) | ORAL | Status: DC | PRN

## 2015-11-09 MED ORDER — GABAPENTIN 400 MG PO CAPS: 400.0000 mg | ORAL_CAPSULE | Freq: Every day | ORAL | Status: DC

## 2015-11-09 NOTE — Progress Notes (Signed)
Subjective:    Patient ID: Hannah Scott, female    DOB: 11-29-1950, 65 y.o.   MRN: [id number]  HPI:  Ms. MAELIE SIRMAN is a 65 year old female who returns for follow up for chronic pain and medication refill. She states her pain is located in her mid-lower back mainly right side and right hip. She rates her pain 7. Her current exercise regime is walking for short distances using her cadillac walker or cane in her home and using her stationary bicycle  weekly for 5-10 minutes..   Pain Inventory Average Pain 8 Pain Right Now 7 My pain is constant, burning, tingling and aching  In the last 24 hours, has pain interfered with the following? General activity 10 Relation with others 10 Enjoyment of life 10 What TIME of day is your pain at its worst? all Sleep (in general) Poor  Pain is worse with: walking and standing Pain improves with: rest and medication Relief from Meds: 4  Mobility use a cane use a walker ability to climb steps?  yes do you drive?  yes  Function disabled: date disabled . I need assistance with the following:  household duties and shopping  Neuro/Psych weakness numbness tingling trouble walking  Prior Studies Any changes since last visit?  no  Physicians involved in your care Any changes since last visit?  no   Family History  Problem Relation Age of Onset  . Heart attack Father   . Heart disease Father   . Heart failure Mother   . Pneumonia Mother   . Hypertension Mother   . Heart disease Mother   . Stroke Maternal Grandfather   . Hypertension Maternal Grandfather   . Asthma Paternal Uncle     PAT UNCLES  . Heart attack Paternal Uncle    Social History   Social History  . Marital Status: Married    Spouse Name: N/A  . Number of Children: N/A  . Years of Education: N/A   Social History Main Topics  . Smoking status: Never Smoker   . Smokeless tobacco: Never Used  . Alcohol Use: No  . Drug Use: No  . Sexual Activity: No   Other  Topics Concern  . None   Social History Narrative   Past Surgical History  Procedure Laterality Date  . Cataract extraction Bilateral 01,03  . Cardiovascular stress test  09/07/05    Nuclear, was negative  . Breast lumpectomy  2003    radiation on right  . Appendectomy  1987    AT TAH  . Total abdominal hysterectomy  1987    LSO, APPENDECTOMY  . Pelvic laparoscopy  1989    RSO, LYSIS OF ADHESIONS  . Oophorectomy      LSO -RSO  . Back surgery      Fusion  . Ankle surgery Left     ligament  . Total hip arthroplasty Right 10/28/2013    dr Lorin Mercy  . Total hip arthroplasty Right 10/28/2013    Procedure: TOTAL HIP ARTHROPLASTY ANTERIOR APPROACH;  Surgeon: Marybelle Killings, MD;  Location: [place];  Service: Orthopedics;  Laterality: Right;  Right Total Hip Arthroplasty, Direct Anterior Approach   Past Medical History  Diagnosis Date  . Asthma     PFTs, February, 2011, moderate obstructive disease with response to bronchodilators, normal lung volumes, moderate reduction in diffusing capacity  . Obstructive airway disease ([place])   . Hyperlipidemia   . Hypertension   . Hypothyroidism  Patient has had in the past that she does not need treatment  . CAD (coronary artery disease)     90% distal LAD in the past  /   nuclear, 2008, no ischemia, ejection fraction 70%  . Atrial septal aneurysm     Echo, 2008-not noted on 13 echo  . Pinched nerve     lower back  . Kyphoscoliosis   . Shortness of breath   . Endometriosis 1989    RIGHT TUBE  . Endometriosis 1987    LEFT TUBE/OVARY W FOCAL IN-SITU ENDOMETRIAL ADENOCARCINOMA  . Cancer ([place]) 2002    DUCTAL CIS--S/P LUMPECTOMY, RADIATION AND 6 WEEKS OF TAMOXIFEN  . Ejection fraction     EF 60%, echo, October, 2008  . Elevated CPK     January, 2014  . D-dimer, elevated     January, 2014  . Depression   . UTI (lower urinary tract infection)   . GERD (gastroesophageal reflux disease)     occ  . H/O hiatal hernia     ?  [name] Arthritis   .  Anemia     hx   BP 127/80 mmHg  Pulse 63  SpO2 93%  Opioid Risk Score:   Fall Risk Score:  `1  Depression screen PHQ 2/9  Depression screen PHQ 2/9 07/11/2014  Decreased Interest 3  Down, Depressed, Hopeless 2  PHQ - 2 Score 5  Altered sleeping 3  Tired, decreased energy 3  Change in appetite 0  Feeling bad or failure about yourself  0  Trouble concentrating 0  Moving slowly or fidgety/restless 0  Suicidal thoughts 0  PHQ-9 Score 11    Review of Systems  All other systems reviewed and are negative.      Objective:   Physical Exam  Constitutional: She is oriented to person, place, and time. She appears well-developed and well-nourished.  HENT:  Head: Normocephalic and atraumatic.  Neck: Normal range of motion. Neck supple.  Cardiovascular: Normal rate and regular rhythm.   Pulmonary/Chest: Effort normal and breath sounds normal.  Musculoskeletal:  Normal Muscle Bulk and Muscle Testing Reveals: Upper Extremities: Full ROM and Muscle Strength 5/5 Lumbar Paraspinal Tenderness: L-3- L-5 Lumbar Scoliosis Lower Extremities: Full ROM and Muscle Strength 5/5 Arises from chair with ease using walker for support Narrow Based Gait   Neurological: She is alert and oriented to person, place, and time.  Skin: Skin is warm and dry.  Psychiatric: She has a normal mood and affect.  Nursing note and vitals reviewed.         Assessment & Plan:  1 Lumbar post laminectomy syndrome, s/p lumbar fusion:  Refilled: Hydrocodone 10/325mg  one tablet every 6 hours #100. We will continue the opioid monitoring program, this consists of regular clinic visits, examinations, urine drug screen, pill counts as well as use of [place] Controlled Substance Reporting system. 2. Right Hip OA: S/P Right Hip Replacement 10/28/2013. 3.Depression: Has Family Support and Friends. Counseling with Doristine Bosworth. 4. Right Greater Trochanteric Tenderness: Continue with Ice and Heat Therapy.   20  minutes of face to face patient care time was spent during this visit. All questions were encouraged and answered.  F/U in 1 month

## 2015-12-03 ENCOUNTER — Other Ambulatory Visit: Payer: Self-pay | Admitting: *Deleted

## 2015-12-03 DIAGNOSIS — N6452 Nipple discharge: Secondary | ICD-10-CM

## 2015-12-03 DIAGNOSIS — Z853 Personal history of malignant neoplasm of breast: Secondary | ICD-10-CM

## 2015-12-08 ENCOUNTER — Encounter: Payer: Medicare Other | Admitting: Registered Nurse

## 2015-12-10 ENCOUNTER — Encounter: Payer: Self-pay | Admitting: Oncology

## 2015-12-10 DIAGNOSIS — Z853 Personal history of malignant neoplasm of breast: Secondary | ICD-10-CM | POA: Diagnosis not present

## 2015-12-11 ENCOUNTER — Encounter: Payer: Self-pay | Admitting: Registered Nurse

## 2015-12-11 ENCOUNTER — Encounter: Payer: Medicare Other | Attending: Physical Medicine & Rehabilitation | Admitting: Registered Nurse

## 2015-12-11 VITALS — BP 144/64 | HR 74 | Resp 14

## 2015-12-11 DIAGNOSIS — M4126 Other idiopathic scoliosis, lumbar region: Secondary | ICD-10-CM | POA: Diagnosis not present

## 2015-12-11 DIAGNOSIS — M961 Postlaminectomy syndrome, not elsewhere classified: Secondary | ICD-10-CM | POA: Diagnosis not present

## 2015-12-11 DIAGNOSIS — G894 Chronic pain syndrome: Secondary | ICD-10-CM

## 2015-12-11 DIAGNOSIS — M24551 Contracture, right hip: Secondary | ICD-10-CM

## 2015-12-11 DIAGNOSIS — M48062 Spinal stenosis, lumbar region with neurogenic claudication: Secondary | ICD-10-CM

## 2015-12-11 DIAGNOSIS — M419 Scoliosis, unspecified: Secondary | ICD-10-CM

## 2015-12-11 DIAGNOSIS — M1611 Unilateral primary osteoarthritis, right hip: Secondary | ICD-10-CM | POA: Diagnosis not present

## 2015-12-11 DIAGNOSIS — M4806 Spinal stenosis, lumbar region: Secondary | ICD-10-CM

## 2015-12-11 DIAGNOSIS — Z5181 Encounter for therapeutic drug level monitoring: Secondary | ICD-10-CM

## 2015-12-11 DIAGNOSIS — Z79899 Other long term (current) drug therapy: Secondary | ICD-10-CM

## 2015-12-11 MED ORDER — HYDROCODONE-ACETAMINOPHEN 10-325 MG PO TABS: 1.0000 | ORAL_TABLET | Freq: Four times a day (QID) | ORAL | 0 refills | Status: DC | PRN

## 2015-12-11 NOTE — Progress Notes (Signed)
Subjective:    Patient ID: Hannah Scott, female    DOB: 05/13/1950, 65 y.o.   MRN: [id number]  HPI: [name] is a 65 year old female who returns for follow up for chronic pain and medication refill. She states her pain is located in her lower back mainly right side and right hip. She rates her pain 7. Her current exercise regime is walking for short distances using her cadillac walker or cane in her home and using her stationary bicycle  weekly for 5-10 minutes..  Pain Inventory Average Pain 7 Pain Right Now 7 My pain is constant, burning, tingling and aching  In the last 24 hours, has pain interfered with the following? General activity 10 Relation with others 10 Enjoyment of life 10 What TIME of day is your pain at its worst? all Sleep (in general) Poor  Pain is worse with: walking and standing Pain improves with: rest and medication Relief from Meds: 5  Mobility walk with assistance use a cane use a walker ability to climb steps?  yes do you drive?  yes  Function disabled: date disabled . I need assistance with the following:  household duties  Neuro/Psych weakness numbness trouble walking depression  Prior Studies Any changes since last visit?  no  Physicians involved in your care Any changes since last visit?  no   Family History  Problem Relation Age of Onset  . Heart attack Father   . Heart disease Father   . Heart failure Mother   . Pneumonia Mother   . Hypertension Mother   . Heart disease Mother   . Stroke Maternal Grandfather   . Hypertension Maternal Grandfather   . Asthma Paternal Uncle     PAT UNCLES  . Heart attack Paternal Uncle    Social History   Social History  . Marital status: Married    Spouse name: N/A  . Number of children: N/A  . Years of education: N/A   Social History Main Topics  . Smoking status: Never Smoker  . Smokeless tobacco: Never Used  . Alcohol use No  . Drug use: No  . Sexual activity: No   Other  Topics Concern  . None   Social History Narrative  . None   Past Surgical History:  Procedure Laterality Date  . ANKLE SURGERY Left    ligament  . APPENDECTOMY  1987   AT TAH  . BACK SURGERY     Fusion  . BREAST LUMPECTOMY  2003   radiation on right  . CARDIOVASCULAR STRESS TEST  09/07/05   Nuclear, was negative  . CATARACT EXTRACTION Bilateral 01,03  . OOPHORECTOMY     LSO -RSO  . PELVIC LAPAROSCOPY  1989   RSO, LYSIS OF ADHESIONS  . TOTAL ABDOMINAL HYSTERECTOMY  1987   LSO, APPENDECTOMY  . TOTAL HIP ARTHROPLASTY Right 10/28/2013   dr Lorin Mercy  . TOTAL HIP ARTHROPLASTY Right 10/28/2013   Procedure: TOTAL HIP ARTHROPLASTY ANTERIOR APPROACH;  Surgeon: Marybelle Killings, MD;  Location: [place];  Service: Orthopedics;  Laterality: Right;  Right Total Hip Arthroplasty, Direct Anterior Approach   Past Medical History:  Diagnosis Date  . Anemia    hx  . Arthritis   . Asthma    PFTs, February, 2011, moderate obstructive disease with response to bronchodilators, normal lung volumes, moderate reduction in diffusing capacity  . Atrial septal aneurysm    Echo, 2008-not noted on 13 echo  . CAD (coronary artery disease)  90% distal LAD in the past  /   nuclear, 2008, no ischemia, ejection fraction 70%  . Cancer ([place]) 2002   DUCTAL CIS--S/P LUMPECTOMY, RADIATION AND 6 WEEKS OF TAMOXIFEN  . D-dimer, elevated    January, 2014  . Depression   . Ejection fraction    EF 60%, echo, October, 2008  . Elevated CPK    January, 2014  . Endometriosis 1989   RIGHT TUBE  . Endometriosis 1987   LEFT TUBE/OVARY W FOCAL IN-SITU ENDOMETRIAL ADENOCARCINOMA  . GERD (gastroesophageal reflux disease)    occ  . H/O hiatal hernia    ?  [name] Hyperlipidemia   . Hypertension   . Hypothyroidism    Patient has had in the past that she does not need treatment  . Kyphoscoliosis   . Obstructive airway disease ([place])   . Pinched nerve    lower back  . Shortness of breath   . UTI (lower urinary tract infection)      BP (!) 144/64 (BP Location: Left Arm, Patient Position: Sitting, Cuff Size: Normal)   Pulse 74   Resp 14   SpO2 91%   Opioid Risk Score:   Fall Risk Score:  `1  Depression screen PHQ 2/9  Depression screen PHQ 2/9 07/11/2014  Decreased Interest 3  Down, Depressed, Hopeless 2  PHQ - 2 Score 5  Altered sleeping 3  Tired, decreased energy 3  Change in appetite 0  Feeling bad or failure about yourself  0  Trouble concentrating 0  Moving slowly or fidgety/restless 0  Suicidal thoughts 0  PHQ-9 Score 11  Some recent data might be hidden    Review of Systems  HENT: Negative.   Eyes: Negative.   Respiratory: Negative.   Cardiovascular: Negative.   Gastrointestinal: Negative.   Endocrine: Negative.   Genitourinary: Negative.   Musculoskeletal: Positive for back pain and gait problem.  Allergic/Immunologic: Negative.   Neurological: Positive for weakness and numbness.  Psychiatric/Behavioral: Positive for dysphoric mood.  All other systems reviewed and are negative.      Objective:   Physical Exam  Constitutional: She is oriented to person, place, and time. She appears well-developed and well-nourished.  HENT:  Head: Normocephalic and atraumatic.  Neck: Normal range of motion. Neck supple.  Cardiovascular: Normal rate and regular rhythm.   Pulmonary/Chest: Effort normal and breath sounds normal.  Musculoskeletal:  Normal Muscle Bulk and Muscle Testing Reveals: Upper Extremities: Full ROM and Muscle Strength 5/5 Lumbar Paraspinal Tenderness: L-3- L-5 Lumbar Scoliosis Lower Extremities: Full ROM and Muscle Strength 5/5 Arises from chair with ease Using walker for support Narrow based gait   Neurological: She is alert and oriented to person, place, and time.  Skin: Skin is warm and dry.  Psychiatric: She has a normal mood and affect.  Nursing note and vitals reviewed.         Assessment & Plan:  1 Lumbar post laminectomy syndrome, s/p lumbar fusion:   Refilled: Hydrocodone 10/325mg  one tablet every 6 hours #100. We will continue the opioid monitoring program, this consists of regular clinic visits, examinations, urine drug screen, pill counts as well as use of [place] Controlled Substance Reporting system. 2. Right Hip OA: S/P Right Hip Replacement 10/28/2013. 3.Depression: Has Family Support and Friends. Counseling with Doristine Bosworth. 4. Right Greater Trochanteric Tenderness: Continue with Ice and Heat Therapy.   20 minutes of face to face patient care time was spent during this visit. All questions were encouraged and answered.  F/U  in 1 month

## 2015-12-16 ENCOUNTER — Other Ambulatory Visit: Payer: Self-pay | Admitting: Radiology

## 2015-12-16 DIAGNOSIS — D242 Benign neoplasm of left breast: Secondary | ICD-10-CM | POA: Diagnosis not present

## 2015-12-16 DIAGNOSIS — N6452 Nipple discharge: Secondary | ICD-10-CM | POA: Diagnosis not present

## 2015-12-18 ENCOUNTER — Encounter: Payer: Self-pay | Admitting: Oncology

## 2016-01-01 ENCOUNTER — Ambulatory Visit: Payer: Self-pay | Admitting: General Surgery

## 2016-01-01 DIAGNOSIS — D242 Benign neoplasm of left breast: Secondary | ICD-10-CM

## 2016-01-06 ENCOUNTER — Encounter: Payer: Self-pay | Admitting: Internal Medicine

## 2016-01-06 ENCOUNTER — Telehealth: Payer: Self-pay | Admitting: Cardiology

## 2016-01-06 ENCOUNTER — Ambulatory Visit (INDEPENDENT_AMBULATORY_CARE_PROVIDER_SITE_OTHER): Payer: Medicare Other | Admitting: Internal Medicine

## 2016-01-06 ENCOUNTER — Other Ambulatory Visit (INDEPENDENT_AMBULATORY_CARE_PROVIDER_SITE_OTHER): Payer: Medicare Other

## 2016-01-06 VITALS — BP 164/92 | HR 67 | Temp 98.5°F | Resp 16 | Wt 159.0 lb

## 2016-01-06 DIAGNOSIS — Z23 Encounter for immunization: Secondary | ICD-10-CM

## 2016-01-06 DIAGNOSIS — I10 Essential (primary) hypertension: Secondary | ICD-10-CM

## 2016-01-06 DIAGNOSIS — F32A Depression, unspecified: Secondary | ICD-10-CM

## 2016-01-06 DIAGNOSIS — E039 Hypothyroidism, unspecified: Secondary | ICD-10-CM

## 2016-01-06 DIAGNOSIS — Z853 Personal history of malignant neoplasm of breast: Secondary | ICD-10-CM | POA: Insufficient documentation

## 2016-01-06 DIAGNOSIS — D242 Benign neoplasm of left breast: Secondary | ICD-10-CM | POA: Diagnosis not present

## 2016-01-06 DIAGNOSIS — F329 Major depressive disorder, single episode, unspecified: Secondary | ICD-10-CM

## 2016-01-06 LAB — LIPID PANEL
CHOL/HDL RATIO: 3
Cholesterol: 131 mg/dL (ref 0–200)
HDL: 50.1 mg/dL (ref >39.00)
LDL Cholesterol: 64 mg/dL (ref 0–99)
NONHDL: 80.56
Triglycerides: 83 mg/dL (ref 0.0–149.0)
VLDL: 16.6 mg/dL (ref 0.0–40.0)

## 2016-01-06 LAB — COMPREHENSIVE METABOLIC PANEL
ALT: 13 U/L (ref 0–35)
AST: 19 U/L (ref 0–37)
Albumin: 4.1 g/dL (ref 3.5–5.2)
Alkaline Phosphatase: 97 U/L (ref 39–117)
BILIRUBIN TOTAL: 0.5 mg/dL (ref 0.2–1.2)
BUN: 11 mg/dL (ref 6–23)
CO2: 34 meq/L — AB (ref 19–32)
CREATININE: 0.57 mg/dL (ref 0.40–1.20)
Calcium: 9.4 mg/dL (ref 8.4–10.5)
Chloride: 103 mEq/L (ref 96–112)
GFR: 112.64 mL/min (ref >60.00)
GLUCOSE: 108 mg/dL — AB (ref 70–99)
Potassium: 4.7 mEq/L (ref 3.5–5.1)
Sodium: 141 mEq/L (ref 135–145)
Total Protein: 6.4 g/dL (ref 6.0–8.3)

## 2016-01-06 LAB — CBC WITH DIFFERENTIAL/PLATELET
BASOS ABS: 0 10*3/uL (ref 0.0–0.1)
Basophils Relative: 0.4 % (ref 0.0–3.0)
EOS PCT: 11.6 % — AB (ref 0.0–5.0)
Eosinophils Absolute: 0.7 10*3/uL (ref 0.0–0.7)
HCT: 35.3 % — ABNORMAL LOW (ref 36.0–46.0)
HEMOGLOBIN: 11.2 g/dL — AB (ref 12.0–15.0)
LYMPHS PCT: 22.2 % (ref 12.0–46.0)
Lymphs Abs: 1.4 10*3/uL (ref 0.7–4.0)
MCHC: 31.8 g/dL (ref 30.0–36.0)
MCV: 74.3 fl — AB (ref 78.0–100.0)
MONOS PCT: 8.9 % (ref 3.0–12.0)
Monocytes Absolute: 0.6 10*3/uL (ref 0.1–1.0)
Neutro Abs: 3.6 10*3/uL (ref 1.4–7.7)
Neutrophils Relative %: 56.9 % (ref 43.0–77.0)
Platelets: 289 10*3/uL (ref 150.0–400.0)
RBC: 4.75 Mil/uL (ref 3.87–5.11)
RDW: 17.7 % — ABNORMAL HIGH (ref 11.5–15.5)
WBC: 6.2 10*3/uL (ref 4.0–10.5)

## 2016-01-06 LAB — TSH: TSH: 0.14 u[IU]/mL — AB (ref 0.35–4.50)

## 2016-01-06 NOTE — Assessment & Plan Note (Signed)
Will have a lumpectomy October 2017

## 2016-01-06 NOTE — Assessment & Plan Note (Signed)
Check tsh  Titrate med dose if needed Typically follows with endocrine - will see him next year

## 2016-01-06 NOTE — Assessment & Plan Note (Signed)
Improved with cymbalta 30 mg daily Discussed possibly increasing dose Continue cymbalta 30 mg daily for now

## 2016-01-06 NOTE — Assessment & Plan Note (Signed)
BP elevated here, well controlled at home - has had overall controlled Continue current medication at current doses Cmp, tsh

## 2016-01-06 NOTE — Progress Notes (Signed)
Pre visit review using our clinic review tool, if applicable. No additional management support is needed unless otherwise documented below in the visit note. 

## 2016-01-06 NOTE — Telephone Encounter (Signed)
New message ° ° ° °Pt verbalized that she is returning call for rn  °

## 2016-01-06 NOTE — Patient Instructions (Signed)
Monitor your BP at home   Test(s) ordered today. Your results will be released to [place] (or called to you) after review, usually within 72hours after test completion. If any changes need to be made, you will be notified at that same time.  All other Health Maintenance issues reviewed.   All recommended immunizations and age-appropriate screenings are up-to-date or discussed.  Flu vaccine administered today.   Medications reviewed and updated.  No changes recommended at this time.    Please followup in 6 months

## 2016-01-06 NOTE — Telephone Encounter (Signed)
Informed the pt that I received a fax from [facility] in regards to her needing cardiac clearance for her upcoming/unscheduled breast surgery.  Offered the pt an appt with Dr Meda Coffee on 02/01/16 at 0830 for her routine 6 month follow-up appt and we can start the clearance process then.  Informed the pt that Dr Meda Coffee did want her to have her labs done prior to that visit, to check a bmet, LFT, TSH, CBC W DIFF, and Lipids.  Informed the pt that I scheduled her a lab appt a week prior to her 10/9 appt for 01/26/16, to check labs mentioned above.  Informed the pt that Raquel Sarna CMA at [facility], is aware that the pt was offered these appt dates for cardiac clearance requested, and agreed with this.  Per the pt, she agrees to come in and see Dr Meda Coffee on 10/9 for f/u and clearance needed.  Per the pt, she states that she is going to her PCP office today at 4 pm, Dr Quay Burow, for routine follow-up.  Pt states that she will see if Dr Quay Burow will order and draw the labs Dr Meda Coffee recommended, at her appt today.  Informed the pt that Dr Quay Burow can see in our charting system the orders for labs Dr Meda Coffee requested, and if she agrees to do this today at the pts appt with her, that's fine too.  Pt states she will request this to Dr Quay Burow, for this will save her a commute twice to our office.  Pt states she will let me know tomorrow if Dr Quay Burow agreed to draw her labs at her appt today, or not.  Pt states to leave her lab appt with our office on 10/3 there, until she notify's our office tomorrow to cancel this or not.  Informed the pt that would be perfectly fine, and we will speak with her tomorrow.  Pt verbalized understanding, agrees with this plan, and very gracious for all the assistance provided.

## 2016-01-06 NOTE — Progress Notes (Signed)
Subjective:    Patient ID: Hannah Scott, female    DOB: 03-May-1950, 65 y.o.   MRN: [id number]  HPI She is here for follow up.  Hypothyroidism:  She is taking her medication daily.  She is feeling more tired and feels her thyroid is off. She follows with endocrine, but would rather not follow up with him now due to cost and having so many other things going on now.    Hypertension: She is taking her medication daily. She is compliant with a low sodium diet.  She denies chest pain, palpitations, edema, shortness of breath and lightheadedness. She is not exercising regularly.  She does monitor her blood pressure at home and it is better controlled.    Depression: She is taking her medication daily as prescribed. She denies any side effects from the medication. She feels her depression is better controlled.  She has had a lot happen in the past month and it is difficult to know if her depression is controlled or not.     She had biopsy of her left breast and has a papilloma.  She will be having a lumpectomy next month after getting cardiac clearance.  She is anxious due to her prior history of breast cancer.     Medications and allergies reviewed with patient and updated if appropriate.  Patient Active Problem List   Diagnosis Date Noted  . Dyspnea 07/23/2015  . Essential hypertension 06/08/2015  . Coronary artery disease involving native coronary artery of native heart without angina pectoris 06/08/2015  . DOE (dyspnea on exertion) 06/08/2015  . Fatigue 05/20/2015  . Influenza A 04/23/2015  . Acute respiratory failure with hypoxia ([place]) 04/22/2015  . Status asthmaticus 04/22/2015  . Back pain 04/22/2015  . Acute respiratory failure with hypercapnia ([place])   . Lumbar scoliosis 01/10/2014  . Hypocalcemia 01/03/2014  . Anemia due to blood loss 11/27/2013  . Edema 11/27/2013  . Elevated CPK   . D-dimer, elevated   . Multinodular goiter 12/01/2011  . Asthma exacerbation   .  Hypothyroidism   . CAD (coronary artery disease)   . Ejection fraction   . Atrial septal aneurysm   . Hyperlipidemia   . Shortness of breath   . Scoliosis   . Spinal stenosis, lumbar region, with neurogenic claudication 07/01/2011  . Lumbar postlaminectomy syndrome 07/01/2011  . Osteoarthritis of right hip 07/01/2011  . Contracture of right hip 07/01/2011  . Endometriosis   . Depression 12/24/2009  . Migraine headache 05/19/2006    Current Outpatient Prescriptions on File Prior to Visit  Medication Sig Dispense Refill  . albuterol (PROVENTIL HFA;VENTOLIN HFA) 108 (90 Base) MCG/ACT inhaler Inhale 1-2 puffs into the lungs every 6 (six) hours as needed for wheezing or shortness of breath.    [name] aspirin EC 325 MG tablet Take 1 tablet (325 mg total) by mouth daily. 30 tablet 0  . atorvastatin (LIPITOR) 20 MG tablet Take 1 tablet (20 mg total) by mouth daily. 90 tablet 3  . benazepril (LOTENSIN) 40 MG tablet Take 0.5 tablets (20 mg total) by mouth daily. 90 tablet 3  . DULoxetine (CYMBALTA) 30 MG capsule Take 1 capsule (30 mg total) by mouth daily. 30 capsule 5  . Fluticasone Furoate-Vilanterol 100-25 MCG/INH AEPB Inhale 1 puff into the lungs daily. 60 each 5  . gabapentin (NEURONTIN) 400 MG capsule Take 1 capsule (400 mg total) by mouth at bedtime. 30 capsule 2  . hydrochlorothiazide (MICROZIDE) 12.5 MG capsule Take 12.5  mg by mouth as directed.     [name] HYDROcodone-acetaminophen (NORCO) 10-325 MG tablet Take 1 tablet by mouth every 6 (six) hours as needed. 100 tablet 0  . ipratropium-albuterol (DUONEB) 0.5-2.5 (3) MG/3ML SOLN Take 3 mLs by nebulization as directed.     [name] levothyroxine (SYNTHROID, LEVOTHROID) 75 MCG tablet Take 1 tablet (75 mcg total) by mouth daily before breakfast. 30 tablet 11   No current facility-administered medications on file prior to visit.     Past Medical History:  Diagnosis Date  . Anemia    hx  . Arthritis   . Asthma    PFTs, February, 2011, moderate  obstructive disease with response to bronchodilators, normal lung volumes, moderate reduction in diffusing capacity  . Atrial septal aneurysm    Echo, 2008-not noted on 13 echo  . CAD (coronary artery disease)    90% distal LAD in the past  /   nuclear, 2008, no ischemia, ejection fraction 70%  . Cancer ([place]) 2002   DUCTAL CIS--S/P LUMPECTOMY, RADIATION AND 6 WEEKS OF TAMOXIFEN  . D-dimer, elevated    January, 2014  . Depression   . Ejection fraction    EF 60%, echo, October, 2008  . Elevated CPK    January, 2014  . Endometriosis 1989   RIGHT TUBE  . Endometriosis 1987   LEFT TUBE/OVARY W FOCAL IN-SITU ENDOMETRIAL ADENOCARCINOMA  . GERD (gastroesophageal reflux disease)    occ  . H/O hiatal hernia    ?  [name] Hyperlipidemia   . Hypertension   . Hypothyroidism    Patient has had in the past that she does not need treatment  . Kyphoscoliosis   . Obstructive airway disease ([place])   . Pinched nerve    lower back  . Shortness of breath   . UTI (lower urinary tract infection)     Past Surgical History:  Procedure Laterality Date  . ANKLE SURGERY Left    ligament  . APPENDECTOMY  1987   AT [facility]  . BACK SURGERY     Fusion  . BREAST LUMPECTOMY  2003   radiation on right  . CARDIOVASCULAR STRESS TEST  09/07/05   Nuclear, was negative  . CATARACT EXTRACTION Bilateral 01,03  . OOPHORECTOMY     LSO -RSO  . PELVIC LAPAROSCOPY  1989   RSO, LYSIS OF ADHESIONS  . TOTAL ABDOMINAL HYSTERECTOMY  1987   LSO, APPENDECTOMY  . TOTAL HIP ARTHROPLASTY Right 10/28/2013   dr Lorin Mercy  . TOTAL HIP ARTHROPLASTY Right 10/28/2013   Procedure: TOTAL HIP ARTHROPLASTY ANTERIOR APPROACH;  Surgeon: Marybelle Killings, MD;  Location: [facility];  Service: Orthopedics;  Laterality: Right;  Right Total Hip Arthroplasty, Direct Anterior Approach    Social History   Social History  . Marital status: Married    Spouse name: N/A  . Number of children: N/A  . Years of education: N/A   Social History Main Topics  .  Smoking status: Never Smoker  . Smokeless tobacco: Never Used  . Alcohol use No  . Drug use: No  . Sexual activity: No   Other Topics Concern  . Not on file   Social History Narrative  . No narrative on file    Family History  Problem Relation Age of Onset  . Heart attack Father   . Heart disease Father   . Heart failure Mother   . Pneumonia Mother   . Hypertension Mother   . Heart disease Mother   . Stroke Maternal  Grandfather   . Hypertension Maternal Grandfather   . Asthma Paternal Uncle     PAT UNCLES  . Heart attack Paternal Uncle     Review of Systems  Constitutional: Positive for appetite change (stress related - decreased) and fatigue. Negative for chills and fever.  Respiratory: Negative for cough, shortness of breath and wheezing.   Cardiovascular: Negative for chest pain, palpitations and leg swelling.  Neurological: Positive for headaches (occasional). Negative for dizziness and light-headedness.       Objective:   Vitals:   01/06/16 1550  BP: (!) 164/92  Pulse: 67  Resp: 16  Temp: 98.5 F (36.9 C)   Filed Weights   01/06/16 1550  Weight: 159 lb (72.1 kg)   Body mass index is 31.05 kg/m.   Physical Exam Constitutional: Appears well-developed and well-nourished. No distress.  HENT:  Head: Normocephalic and atraumatic.  Neck: Neck supple. No tracheal deviation present. No thyromegaly present.  Cardiovascular: Normal rate, regular rhythm and normal heart sounds.   2/6 systolic murmur heard.  No carotid bruit  Pulmonary/Chest: Effort normal and breath sounds normal. No respiratory distress. No has no wheezes. No rales.  Musculoskeletal: No edema. severe scoliosis Lymphadenopathy: No cervical adenopathy.  Skin: Skin is warm and dry. Not diaphoretic.  Psychiatric: Normal mood and affect. Behavior is normal.       Assessment & Plan:   See Problem List for Assessment and Plan of chronic medical problems.

## 2016-01-07 ENCOUNTER — Other Ambulatory Visit: Payer: Self-pay | Admitting: Internal Medicine

## 2016-01-08 ENCOUNTER — Encounter: Payer: Self-pay | Admitting: Endocrinology

## 2016-01-08 NOTE — Telephone Encounter (Signed)
Pt had her labs done at her PCP office, Dr Quay Burow, on 01/06/16.  Dr Quay Burow checked a cmet, TSH, cbc w diff, and lipids.  This was resulted by Dr Quay Burow and results with recommendations given to the pt.  These labs were recommended initially by Dr Meda Coffee, but the pt wanted her PCP to do it, for its an easier commute, than coming all the way to our office for lab.  Will cancel the pts lab appt at our office for 10/3.  Pt to still follow-up with Dr Meda Coffee on 10/9, for follow-up and cardiac clearance needed.

## 2016-01-08 NOTE — Telephone Encounter (Signed)
I contacted the patient and advised via voicemail to follow the advise of Dr. Quay Burow per Dr. Loanne Drilling.

## 2016-01-12 ENCOUNTER — Other Ambulatory Visit: Payer: Self-pay | Admitting: Internal Medicine

## 2016-01-13 ENCOUNTER — Encounter: Payer: Self-pay | Admitting: *Deleted

## 2016-01-13 ENCOUNTER — Encounter: Payer: Medicare Other | Attending: Physical Medicine & Rehabilitation | Admitting: Registered Nurse

## 2016-01-13 ENCOUNTER — Encounter: Payer: Self-pay | Admitting: Registered Nurse

## 2016-01-13 VITALS — BP 135/90 | HR 72 | Resp 14

## 2016-01-13 DIAGNOSIS — M4126 Other idiopathic scoliosis, lumbar region: Secondary | ICD-10-CM | POA: Diagnosis not present

## 2016-01-13 DIAGNOSIS — M1611 Unilateral primary osteoarthritis, right hip: Secondary | ICD-10-CM | POA: Insufficient documentation

## 2016-01-13 DIAGNOSIS — M961 Postlaminectomy syndrome, not elsewhere classified: Secondary | ICD-10-CM

## 2016-01-13 DIAGNOSIS — M419 Scoliosis, unspecified: Secondary | ICD-10-CM

## 2016-01-13 DIAGNOSIS — M4806 Spinal stenosis, lumbar region: Secondary | ICD-10-CM | POA: Diagnosis not present

## 2016-01-13 DIAGNOSIS — G894 Chronic pain syndrome: Secondary | ICD-10-CM

## 2016-01-13 DIAGNOSIS — M24551 Contracture, right hip: Secondary | ICD-10-CM | POA: Diagnosis not present

## 2016-01-13 DIAGNOSIS — Z5181 Encounter for therapeutic drug level monitoring: Secondary | ICD-10-CM

## 2016-01-13 DIAGNOSIS — M48062 Spinal stenosis, lumbar region with neurogenic claudication: Secondary | ICD-10-CM

## 2016-01-13 DIAGNOSIS — Z79899 Other long term (current) drug therapy: Secondary | ICD-10-CM

## 2016-01-13 MED ORDER — HYDROCODONE-ACETAMINOPHEN 10-325 MG PO TABS: 1.0000 | ORAL_TABLET | Freq: Four times a day (QID) | ORAL | 0 refills | Status: DC | PRN

## 2016-01-13 NOTE — Progress Notes (Signed)
Subjective:    Patient ID: Hannah Scott, female    DOB: Nov 29, 1950, 65 y.o.   MRN: [id number]  HPI:  Ms. KAHMILA SARTWELL is a 65 year old female who returns for follow up for chronic pain and medication refill. She states her pain is located in her lower back mainly right side and occasionally in her right hip. She rates her pain 7. Her current exercise regime is walking for short distances using her cadillac walker or cane in her home and using her stationary bicycle weekly for 5-10 minutes..  Pain Inventory Average Pain 8 Pain Right Now 7 My pain is constant, burning, tingling and aching  In the last 24 hours, has pain interfered with the following? General activity 10 Relation with others 10 Enjoyment of life 10 What TIME of day is your pain at its worst? all Sleep (in general) Poor  Pain is worse with: walking and standing Pain improves with: rest and medication Relief from Meds: 4  Mobility walk with assistance use a cane use a walker ability to climb steps?  yes do you drive?  yes  Function disabled: date disabled . I need assistance with the following:  household duties and shopping  Neuro/Psych weakness numbness tingling depression  Prior Studies Any changes since last visit?  no  Physicians involved in your care Any changes since last visit?  no   Family History  Problem Relation Age of Onset  . Heart failure Mother   . Pneumonia Mother   . Hypertension Mother   . Heart disease Mother   . Stroke Maternal Grandfather   . Hypertension Maternal Grandfather   . Heart attack Father   . Heart disease Father   . Asthma Paternal Uncle     PAT UNCLES  . Heart attack Paternal Uncle    Social History   Social History  . Marital status: Married    Spouse name: N/A  . Number of children: N/A  . Years of education: N/A   Social History Main Topics  . Smoking status: Never Smoker  . Smokeless tobacco: Never Used  . Alcohol use No  . Drug use: No  .  Sexual activity: No   Other Topics Concern  . None   Social History Narrative  . None   Past Surgical History:  Procedure Laterality Date  . ANKLE SURGERY Left    ligament  . APPENDECTOMY  1987   AT [facility]  . BACK SURGERY     Fusion  . BREAST LUMPECTOMY  2003   radiation on right  . CARDIOVASCULAR STRESS TEST  09/07/05   Nuclear, was negative  . CATARACT EXTRACTION Bilateral 01,03  . OOPHORECTOMY     LSO -RSO  . PELVIC LAPAROSCOPY  1989   RSO, LYSIS OF ADHESIONS  . TOTAL ABDOMINAL HYSTERECTOMY  1987   LSO, APPENDECTOMY  . TOTAL HIP ARTHROPLASTY Right 10/28/2013   dr Lorin Mercy  . TOTAL HIP ARTHROPLASTY Right 10/28/2013   Procedure: TOTAL HIP ARTHROPLASTY ANTERIOR APPROACH;  Surgeon: Marybelle Killings, MD;  Location: [facility];  Service: Orthopedics;  Laterality: Right;  Right Total Hip Arthroplasty, Direct Anterior Approach   Past Medical History:  Diagnosis Date  . Anemia    hx  . Arthritis   . Asthma    PFTs, February, 2011, moderate obstructive disease with response to bronchodilators, normal lung volumes, moderate reduction in diffusing capacity  . Atrial septal aneurysm    Echo, 2008-not noted on 13 echo  .  CAD (coronary artery disease)    90% distal LAD in the past  /   nuclear, 2008, no ischemia, ejection fraction 70%  . Cancer ([place]) 2002   DUCTAL CIS--S/P LUMPECTOMY, RADIATION AND 6 WEEKS OF TAMOXIFEN  . D-dimer, elevated    January, 2014  . Depression   . Ejection fraction    EF 60%, echo, October, 2008  . Elevated CPK    January, 2014  . Endometriosis 1989   RIGHT TUBE  . Endometriosis 1987   LEFT TUBE/OVARY W FOCAL IN-SITU ENDOMETRIAL ADENOCARCINOMA  . GERD (gastroesophageal reflux disease)    occ  . H/O hiatal hernia    ?  [name] Hyperlipidemia   . Hypertension   . Hypothyroidism    Patient has had in the past that she does not need treatment  . Kyphoscoliosis   . Obstructive airway disease ([place])   . Pinched nerve    lower back  . Shortness of breath   . UTI  (lower urinary tract infection)    BP 135/90 (BP Location: Right Arm, Patient Position: Sitting, Cuff Size: Normal)   Pulse 72   Resp 14   SpO2 96%   Opioid Risk Score:   Fall Risk Score:  `1  Depression screen PHQ 2/9  Depression screen [facility] 2/9 01/06/2016 07/11/2014  Decreased Interest 0 3  Down, Depressed, Hopeless 1 2  PHQ - 2 Score 1 5  Altered sleeping - 3  Tired, decreased energy - 3  Change in appetite - 0  Feeling bad or failure about yourself  - 0  Trouble concentrating - 0  Moving slowly or fidgety/restless - 0  Suicidal thoughts - 0  PHQ-9 Score - 11  Some recent data might be hidden    Review of Systems  HENT: Negative.   Cardiovascular: Negative.   Gastrointestinal: Negative.   Endocrine: Negative.   Genitourinary: Negative.   Musculoskeletal: Positive for back pain.  Skin: Negative.   Allergic/Immunologic: Negative.   Neurological: Positive for weakness and numbness.       Tingling  Psychiatric/Behavioral: Positive for dysphoric mood.  All other systems reviewed and are negative.      Objective:   Physical Exam  Constitutional: She is oriented to person, place, and time. She appears well-developed and well-nourished.  HENT:  Head: Normocephalic and atraumatic.  Neck: Normal range of motion. Neck supple.  Cardiovascular: Normal rate and regular rhythm.   Pulmonary/Chest: Effort normal and breath sounds normal.  Musculoskeletal:  Normal Muscle Bulk and Muscle Testing Reveals: Upper Extremities: Full ROM and Muscle Strength 5/5 Lumbar Paraspinal Tenderness: L-3- L-5 Lumbar Scoliosis Lower Extremities: Full ROM and Muscle Strength 5/5 Arises from chair with ease Using walker for support  Narrow Based Gait  Neurological: She is alert and oriented to person, place, and time.  Skin: Skin is warm and dry.  Psychiatric: She has a normal mood and affect.  Nursing note and vitals reviewed.         Assessment & Plan:  1 Lumbar post laminectomy  syndrome, s/p lumbar fusion:  Refilled: Hydrocodone 10/325mg  one tablet every 6 hours #100. We will continue the opioid monitoring program, this consists of regular clinic visits, examinations, urine drug screen, pill counts as well as use of [place] Controlled Substance Reporting system. 2. Right Hip OA: S/P Right Hip Replacement 10/28/2013. 3.Depression: Has Family Support and Friends. Counseling with Doristine Bosworth. 4. Right Greater Trochanteric Tenderness: Continue with Ice and Heat Therapy.   20 minutes of face to  face patient care time was spent during this visit. All questions were encouraged and answered.  F/U in 1 month

## 2016-01-14 ENCOUNTER — Encounter: Payer: Self-pay | Admitting: Cardiology

## 2016-01-26 ENCOUNTER — Other Ambulatory Visit: Payer: Medicare Other

## 2016-02-01 ENCOUNTER — Ambulatory Visit (INDEPENDENT_AMBULATORY_CARE_PROVIDER_SITE_OTHER): Payer: Medicare Other | Admitting: Cardiology

## 2016-02-01 VITALS — BP 126/72 | HR 73 | Ht 60.0 in | Wt 161.0 lb

## 2016-02-01 DIAGNOSIS — I10 Essential (primary) hypertension: Secondary | ICD-10-CM

## 2016-02-01 DIAGNOSIS — E782 Mixed hyperlipidemia: Secondary | ICD-10-CM | POA: Diagnosis not present

## 2016-02-01 DIAGNOSIS — Z01810 Encounter for preprocedural cardiovascular examination: Secondary | ICD-10-CM

## 2016-02-01 DIAGNOSIS — I2510 Atherosclerotic heart disease of native coronary artery without angina pectoris: Secondary | ICD-10-CM

## 2016-02-01 NOTE — Patient Instructions (Signed)
Medication Instructions:   Your physician recommends that you continue on your current medications as directed. Please refer to the Current Medication list given to you today.    Follow-Up:  Your physician wants you to follow-up in: [place] will receive a reminder letter in the mail two months in advance. If you don't receive a letter, please call our office to schedule the follow-up appointment.    WE WILL FORWARD DR NELSON'S OFFICE NOTE TO DR Excell Seltzer FOR HIS REVIEW AND FOR YOUR CLEARANCE FOR SURGERY     If you need a refill on your cardiac medications before your next appointment, please call your pharmacy.

## 2016-02-01 NOTE — Progress Notes (Signed)
Patient ID: Hannah Scott, female   DOB: 1950-11-02, 65 y.o.   MRN: [id number]      Cardiology Office Note  Date:  02/01/2016   ID:  Hannah Scott, DOB June 22, 1950, MRN [id number]  PCP:  Binnie Rail, MD  Cardiologist:  Ena Dawley, MD , previously Dr Ron Parker  Chief complain: Re-establish care   History of Present Illness:  Hannah Scott is a 65 y.o. female with h/o 2 prior MIs and to cardiac catheterization the last one in 1999. She was told at the time that her myocardial infarction was result of coronary spasm and she didn't get any stents placed. She has known distal LAD disease.  She has been followed by Dr. Ron Parker and was stable, her last stress test was done in December 2014 where she had mild ischemia and conservative management was decided. She was taking amlodipine for years but developed significant lower extremity edema that resolved after she discontinued. In December 2016 she was hospitalized with acute respiratory failure secondary to asthma attack triggered by influenza A. She states that despite recovery from these her shortness of breath has never resolved. She feels short of breath intermittently on exertion and at rest. She is currently not wheezing and doesn't need to use rescue inhalers.  02/01/16 - 6 months follow up, the patient has been doing well, denies any chest pain and has stable dyspnea on exertion. No recent asthma attacks or hospitalization. She has been compliant to her meds and tolerates them well. She underwent Lexiscan nuclear stress test in March 2017 that was negative for ischemia, normal LVEF. She also denies any recent LE edema, orthopnea no palpitations or syncope.  She walks with a walker very slowly but still able to perform all activities of daily living including shopping.  She has history of breast cancer and recently was found to have a new lump in her left breast, she is scheduled for lumpectomy on 02/12/2016.  Past Medical History:  Diagnosis Date  .  Anemia    hx  . Arthritis   . Asthma    PFTs, February, 2011, moderate obstructive disease with response to bronchodilators, normal lung volumes, moderate reduction in diffusing capacity  . Atrial septal aneurysm    Echo, 2008-not noted on 13 echo  . CAD (coronary artery disease)    90% distal LAD in the past  /   nuclear, 2008, no ischemia, ejection fraction 70%  . Cancer ([place]) 2002   DUCTAL CIS--S/P LUMPECTOMY, RADIATION AND 6 WEEKS OF TAMOXIFEN  . D-dimer, elevated    January, 2014  . Depression   . Ejection fraction    EF 60%, echo, October, 2008  . Elevated CPK    January, 2014  . Endometriosis 1989   RIGHT TUBE  . Endometriosis 1987   LEFT TUBE/OVARY W FOCAL IN-SITU ENDOMETRIAL ADENOCARCINOMA  . GERD (gastroesophageal reflux disease)    occ  . H/O hiatal hernia    ?  [name] Hyperlipidemia   . Hypertension   . Hypothyroidism    Patient has had in the past that she does not need treatment  . Kyphoscoliosis   . Obstructive airway disease ([place])   . Pinched nerve    lower back  . Shortness of breath   . UTI (lower urinary tract infection)    Past Surgical History:  Procedure Laterality Date  . ANKLE SURGERY Left    ligament  . APPENDECTOMY  1987   AT [facility]  . BACK SURGERY  Fusion  . BREAST LUMPECTOMY  2003   radiation on right  . CARDIOVASCULAR STRESS TEST  09/07/05   Nuclear, was negative  . CATARACT EXTRACTION Bilateral 01,03  . OOPHORECTOMY     LSO -RSO  . PELVIC LAPAROSCOPY  1989   RSO, LYSIS OF ADHESIONS  . TOTAL ABDOMINAL HYSTERECTOMY  1987   LSO, APPENDECTOMY  . TOTAL HIP ARTHROPLASTY Right 10/28/2013   dr Lorin Mercy  . TOTAL HIP ARTHROPLASTY Right 10/28/2013   Procedure: TOTAL HIP ARTHROPLASTY ANTERIOR APPROACH;  Surgeon: Marybelle Killings, MD;  Location: [facility];  Service: Orthopedics;  Laterality: Right;  Right Total Hip Arthroplasty, Direct Anterior Approach    Current Outpatient Prescriptions  Medication Sig Dispense Refill  . albuterol (PROVENTIL HFA;VENTOLIN  HFA) 108 (90 Base) MCG/ACT inhaler Inhale 1-2 puffs into the lungs every 6 (six) hours as needed for wheezing or shortness of breath.    [name] aspirin EC 325 MG tablet Take 1 tablet (325 mg total) by mouth daily. 30 tablet 0  . atorvastatin (LIPITOR) 20 MG tablet Take 1 tablet (20 mg total) by mouth daily. 90 tablet 3  . benazepril (LOTENSIN) 40 MG tablet Take 0.5 tablets (20 mg total) by mouth daily. 90 tablet 3  . DULoxetine (CYMBALTA) 30 MG capsule Take 1 capsule (30 mg total) by mouth daily. 30 capsule 5  . Fluticasone Furoate-Vilanterol 100-25 MCG/INH AEPB Inhale 1 puff into the lungs daily. 60 each 5  . gabapentin (NEURONTIN) 400 MG capsule Take 1 capsule (400 mg total) by mouth at bedtime. 30 capsule 2  . hydrochlorothiazide (MICROZIDE) 12.5 MG capsule Take 12.5 mg by mouth as directed.     [name] HYDROcodone-acetaminophen (NORCO) 10-325 MG tablet Take 1 tablet by mouth every 6 (six) hours as needed. 100 tablet 0  . ipratropium-albuterol (DUONEB) 0.5-2.5 (3) MG/3ML SOLN Take 3 mLs by nebulization as directed.     [name] levothyroxine (SYNTHROID, LEVOTHROID) 75 MCG tablet Take 1 tablet (75 mcg total) by mouth daily before breakfast. 30 tablet 11  . metoprolol (LOPRESSOR) 50 MG tablet TAKE ONE TABLET TWICE DAILY 60 tablet 5  . metoprolol tartrate (LOPRESSOR) 25 MG tablet Take 25 mg by mouth 2 (two) times daily.     No current facility-administered medications for this visit.     Allergies:   Fluticasone-salmeterol; Norvasc [amlodipine besylate]; and Tape    Social History:  The patient  reports that she has never smoked. She has never used smokeless tobacco. She reports that she does not drink alcohol or use drugs.   Family History:  The patient's family history includes Asthma in her paternal uncle; Heart attack in her father and paternal uncle; Heart disease in her father and mother; Heart failure in her mother; Hypertension in her maternal grandfather and mother; Pneumonia in her mother; Stroke in  her maternal grandfather.   ROS:  Please see the history of present illness.   Otherwise, review of systems are positive for none.   All other systems are reviewed and negative.   PHYSICAL EXAM: VS:  There were no vitals taken for this visit. , BMI There is no height or weight on file to calculate BMI. GEN: Well nourished, well developed, in no acute distress  HEENT: normal  Neck: no JVD, carotid bruits, or masses Cardiac: RRR; no murmurs, rubs, or gallops,no edema  Respiratory:  clear to auscultation bilaterally, normal work of breathing GI: soft, nontender, nondistended, + BS MS: no deformity or atrophy  Skin: warm and dry, no rash  Neuro:  Strength and sensation are intact Psych: euthymic mood, full affect  EKG:  EKG is not ordered today. The ekg ordered during hospitalization in December 2016 shows sinus tachycardia, otherwise EKG unchanged from prior in 2015.  Recent Labs: 01/06/2016: ALT 13; BUN 11; Creatinine, Ser 0.57; Hemoglobin 11.2; Platelets 289.0; Potassium 4.7; Sodium 141; TSH 0.14   Lipid Panel    Component Value Date/Time   CHOL 131 01/06/2016 1630   TRIG 83.0 01/06/2016 1630   HDL 50.10 01/06/2016 1630   CHOLHDL 3 01/06/2016 1630   VLDL 16.6 01/06/2016 1630   LDLCALC 64 01/06/2016 1630   LDLDIRECT 154.8 02/20/2009 1116     Wt Readings from Last 3 Encounters:  01/06/16 159 lb (72.1 kg)  10/22/15 173 lb (78.5 kg)  08/21/15 164 lb (74.4 kg)    TTE: 08/31/2011 Left ventricle: The cavity size was normal. Wall thickness was increased in a pattern of mild LVH. Systolic function was normal. The estimated ejection fraction was 55%. Wall motion was normal; there were no regional wall motion abnormalities. - Left atrium: The atrium was mildly dilated. - Atrial septum: No defect or patent foramen ovale was identified. - Pulmonary arteries: PA peak pressure: 64mm Hg (S).  Lexiscan nuclear stress test: 06/30/2015  Nuclear stress EF: 55%.  There was no ST  segment deviation noted during stress.  Defect 1: There is a small defect of mild severity present in the apical anterior location. This may represent distal LAD disease vs. apical thinning. There is no evidence of reversible ischemia  This is a low risk study.    ASSESSMENT AND PLAN:  1. Coronary artery disease  - known LAD disease and prior to myocardial infarctions, Normal stress test on 06/30/2015. We'll continue aspirin, atorvastatin, lisinopril and metoprolol. Unchanged EKG today.   2. Hyperlipidemia  - on atorvastatin 40 mg daily, her most recent lipid panel was excellent, with decreased atorvastatin to 20 mg daily and all of her lipids are still at goal on most recent blood work in September 2017.   3. Hypertension  - well controlled , continue metoprolol and benazepril.  4. Preoperative evaluation for lumpectomy in her left breast, patient is currently considered a low risk for an intermediate risk surgery, there is currently no contraindication for this patient to undergo the surgery. Please continue metoprolol in the perioperative period  5. Hypothyroidism on Synthroid, most recent TSH very low adjusted by her primary care physician.  Follow up in 1 year.  Signed, Ena Dawley, MD  02/01/2016 8:35 AM    [address], [place], [place]  [address] Phone: [phone]; Fax: [phone]

## 2016-02-05 ENCOUNTER — Encounter (HOSPITAL_BASED_OUTPATIENT_CLINIC_OR_DEPARTMENT_OTHER): Payer: Self-pay | Admitting: *Deleted

## 2016-02-10 DIAGNOSIS — Z01818 Encounter for other preprocedural examination: Secondary | ICD-10-CM | POA: Diagnosis not present

## 2016-02-10 DIAGNOSIS — D242 Benign neoplasm of left breast: Secondary | ICD-10-CM | POA: Diagnosis not present

## 2016-02-10 DIAGNOSIS — Z0000 Encounter for general adult medical examination without abnormal findings: Secondary | ICD-10-CM | POA: Diagnosis not present

## 2016-02-10 NOTE — Progress Notes (Signed)
Boost drink given with instructions to complete by 0415, pt verbalized understanding.

## 2016-02-11 ENCOUNTER — Ambulatory Visit: Payer: Medicare Other | Admitting: Registered Nurse

## 2016-02-11 ENCOUNTER — Telehealth: Payer: Self-pay | Admitting: *Deleted

## 2016-02-11 MED ORDER — GABAPENTIN 400 MG PO CAPS: 400.0000 mg | ORAL_CAPSULE | Freq: Every day | ORAL | 5 refills | Status: DC

## 2016-02-11 NOTE — Telephone Encounter (Signed)
Requesting refill ongabapentin. Sent to pharmacy and [facility] notified.

## 2016-02-12 ENCOUNTER — Ambulatory Visit (HOSPITAL_BASED_OUTPATIENT_CLINIC_OR_DEPARTMENT_OTHER)
Admission: RE | Admit: 2016-02-12 | Discharge: 2016-02-12 | Disposition: A | Discharge status: Home / Self Care | Payer: Medicare Other | Source: Ambulatory Visit | Attending: General Surgery | Admitting: General Surgery

## 2016-02-12 ENCOUNTER — Encounter (HOSPITAL_BASED_OUTPATIENT_CLINIC_OR_DEPARTMENT_OTHER)
Admission: RE | Disposition: A | Discharge status: Home / Self Care | Payer: Self-pay | Source: Ambulatory Visit | Attending: General Surgery

## 2016-02-12 ENCOUNTER — Encounter (HOSPITAL_BASED_OUTPATIENT_CLINIC_OR_DEPARTMENT_OTHER): Payer: Self-pay | Admitting: *Deleted

## 2016-02-12 ENCOUNTER — Ambulatory Visit (HOSPITAL_BASED_OUTPATIENT_CLINIC_OR_DEPARTMENT_OTHER): Payer: Medicare Other | Admitting: Anesthesiology

## 2016-02-12 DIAGNOSIS — R921 Mammographic calcification found on diagnostic imaging of breast: Secondary | ICD-10-CM | POA: Diagnosis not present

## 2016-02-12 DIAGNOSIS — Z7982 Long term (current) use of aspirin: Secondary | ICD-10-CM | POA: Insufficient documentation

## 2016-02-12 DIAGNOSIS — I2510 Atherosclerotic heart disease of native coronary artery without angina pectoris: Secondary | ICD-10-CM | POA: Insufficient documentation

## 2016-02-12 DIAGNOSIS — Z79891 Long term (current) use of opiate analgesic: Secondary | ICD-10-CM | POA: Insufficient documentation

## 2016-02-12 DIAGNOSIS — M1990 Unspecified osteoarthritis, unspecified site: Secondary | ICD-10-CM | POA: Diagnosis not present

## 2016-02-12 DIAGNOSIS — I10 Essential (primary) hypertension: Secondary | ICD-10-CM | POA: Diagnosis not present

## 2016-02-12 DIAGNOSIS — N6012 Diffuse cystic mastopathy of left breast: Secondary | ICD-10-CM | POA: Diagnosis not present

## 2016-02-12 DIAGNOSIS — Z79899 Other long term (current) drug therapy: Secondary | ICD-10-CM | POA: Insufficient documentation

## 2016-02-12 DIAGNOSIS — I252 Old myocardial infarction: Secondary | ICD-10-CM | POA: Insufficient documentation

## 2016-02-12 DIAGNOSIS — J449 Chronic obstructive pulmonary disease, unspecified: Secondary | ICD-10-CM | POA: Diagnosis not present

## 2016-02-12 DIAGNOSIS — Z923 Personal history of irradiation: Secondary | ICD-10-CM | POA: Insufficient documentation

## 2016-02-12 DIAGNOSIS — J45909 Unspecified asthma, uncomplicated: Secondary | ICD-10-CM | POA: Diagnosis not present

## 2016-02-12 DIAGNOSIS — E039 Hypothyroidism, unspecified: Secondary | ICD-10-CM | POA: Insufficient documentation

## 2016-02-12 DIAGNOSIS — D242 Benign neoplasm of left breast: Secondary | ICD-10-CM | POA: Diagnosis not present

## 2016-02-12 HISTORY — PX: BREAST LUMPECTOMY WITH RADIOACTIVE SEED LOCALIZATION: SHX6424

## 2016-02-12 SURGERY — BREAST LUMPECTOMY WITH RADIOACTIVE SEED LOCALIZATION
Anesthesia: General | Site: Breast | Laterality: Left

## 2016-02-12 MED ORDER — CHLORHEXIDINE GLUCONATE CLOTH 2 % EX PADS: 6.0000 | MEDICATED_PAD | Freq: Once | CUTANEOUS | Status: DC

## 2016-02-12 MED ORDER — PROPOFOL 10 MG/ML IV BOLUS
INTRAVENOUS | Status: DC | PRN
  Administered 2016-02-12: 150 mg via INTRAVENOUS

## 2016-02-12 MED ORDER — DEXAMETHASONE SODIUM PHOSPHATE 10 MG/ML IJ SOLN
INTRAMUSCULAR | Status: AC
  Filled 2016-02-12: qty 1

## 2016-02-12 MED ORDER — HYDROCODONE-ACETAMINOPHEN 5-325 MG PO TABS: 1.0000 | ORAL_TABLET | ORAL | 0 refills | Status: DC | PRN

## 2016-02-12 MED ORDER — ACETAMINOPHEN 500 MG PO TABS
ORAL_TABLET | ORAL | Status: AC
  Filled 2016-02-12: qty 2

## 2016-02-12 MED ORDER — ONDANSETRON HCL 4 MG/2ML IJ SOLN
INTRAMUSCULAR | Status: DC | PRN
  Administered 2016-02-12: 4 mg via INTRAVENOUS

## 2016-02-12 MED ORDER — GABAPENTIN 300 MG PO CAPS
300.0000 mg | ORAL_CAPSULE | ORAL | Status: AC
  Administered 2016-02-12: 300 mg via ORAL

## 2016-02-12 MED ORDER — SCOPOLAMINE 1 MG/3DAYS TD PT72: 1.0000 | MEDICATED_PATCH | Freq: Once | TRANSDERMAL | Status: DC | PRN

## 2016-02-12 MED ORDER — BUPIVACAINE-EPINEPHRINE 0.25% -1:200000 IJ SOLN
INTRAMUSCULAR | Status: AC
  Filled 2016-02-12: qty 1

## 2016-02-12 MED ORDER — LIDOCAINE 2% (20 MG/ML) 5 ML SYRINGE
INTRAMUSCULAR | Status: AC
  Filled 2016-02-12: qty 5

## 2016-02-12 MED ORDER — ONDANSETRON HCL 4 MG/2ML IJ SOLN
INTRAMUSCULAR | Status: AC
  Filled 2016-02-12: qty 2

## 2016-02-12 MED ORDER — CEFAZOLIN SODIUM-DEXTROSE 2-4 GM/100ML-% IV SOLN
INTRAVENOUS | Status: AC
  Filled 2016-02-12: qty 100

## 2016-02-12 MED ORDER — GABAPENTIN 300 MG PO CAPS
ORAL_CAPSULE | ORAL | Status: AC
  Filled 2016-02-12: qty 1

## 2016-02-12 MED ORDER — FENTANYL CITRATE (PF) 100 MCG/2ML IJ SOLN
INTRAMUSCULAR | Status: AC
  Filled 2016-02-12: qty 2

## 2016-02-12 MED ORDER — LACTATED RINGERS IV SOLN
INTRAVENOUS | Status: DC
  Administered 2016-02-12 (×2): via INTRAVENOUS

## 2016-02-12 MED ORDER — CELECOXIB 200 MG PO CAPS
ORAL_CAPSULE | ORAL | Status: AC
  Filled 2016-02-12: qty 2

## 2016-02-12 MED ORDER — BUPIVACAINE-EPINEPHRINE (PF) 0.25% -1:200000 IJ SOLN
INTRAMUSCULAR | Status: DC | PRN
  Administered 2016-02-12: 10 mL via PERINEURAL

## 2016-02-12 MED ORDER — ACETAMINOPHEN 500 MG PO TABS
1000.0000 mg | ORAL_TABLET | ORAL | Status: AC
  Administered 2016-02-12: 1000 mg via ORAL

## 2016-02-12 MED ORDER — EPHEDRINE SULFATE-NACL 50-0.9 MG/10ML-% IV SOSY
PREFILLED_SYRINGE | INTRAVENOUS | Status: DC | PRN
Start: 2016-02-12 — End: 2016-02-12
  Administered 2016-02-12: 5 mg via INTRAVENOUS
  Administered 2016-02-12: 10 mg via INTRAVENOUS
  Administered 2016-02-12: 5 mg via INTRAVENOUS

## 2016-02-12 MED ORDER — MIDAZOLAM HCL 2 MG/2ML IJ SOLN
1.0000 mg | INTRAMUSCULAR | Status: DC | PRN
  Administered 2016-02-12: 1 mg via INTRAVENOUS

## 2016-02-12 MED ORDER — CELECOXIB 400 MG PO CAPS
400.0000 mg | ORAL_CAPSULE | ORAL | Status: AC
  Administered 2016-02-12: 400 mg via ORAL

## 2016-02-12 MED ORDER — PROPOFOL 500 MG/50ML IV EMUL
INTRAVENOUS | Status: AC
  Filled 2016-02-12: qty 50

## 2016-02-12 MED ORDER — HYDROCODONE-ACETAMINOPHEN 5-325 MG PO TABS
ORAL_TABLET | ORAL | Status: AC
Start: 2016-02-12 — End: 2016-02-12
  Filled 2016-02-12: qty 1

## 2016-02-12 MED ORDER — PROMETHAZINE HCL 25 MG/ML IJ SOLN: 6.2500 mg | INTRAMUSCULAR | Status: DC | PRN

## 2016-02-12 MED ORDER — FENTANYL CITRATE (PF) 100 MCG/2ML IJ SOLN
50.0000 ug | INTRAMUSCULAR | Status: DC | PRN
  Administered 2016-02-12: 50 ug via INTRAVENOUS
  Administered 2016-02-12: 25 ug via INTRAVENOUS

## 2016-02-12 MED ORDER — GLYCOPYRROLATE 0.2 MG/ML IJ SOLN: 0.2000 mg | Freq: Once | INTRAMUSCULAR | Status: DC | PRN

## 2016-02-12 MED ORDER — LIDOCAINE 2% (20 MG/ML) 5 ML SYRINGE
INTRAMUSCULAR | Status: DC | PRN
  Administered 2016-02-12: 40 mg via INTRAVENOUS

## 2016-02-12 MED ORDER — CEFAZOLIN SODIUM-DEXTROSE 2-4 GM/100ML-% IV SOLN
2.0000 g | INTRAVENOUS | Status: AC
  Administered 2016-02-12: 2 g via INTRAVENOUS

## 2016-02-12 MED ORDER — HYDROMORPHONE HCL 1 MG/ML IJ SOLN: 0.2500 mg | INTRAMUSCULAR | Status: DC | PRN

## 2016-02-12 MED ORDER — HYDROCODONE-ACETAMINOPHEN 5-325 MG PO TABS
1.0000 | ORAL_TABLET | Freq: Once | ORAL | Status: AC | PRN
  Administered 2016-02-12: 1 via ORAL

## 2016-02-12 MED ORDER — MIDAZOLAM HCL 2 MG/2ML IJ SOLN
INTRAMUSCULAR | Status: AC
  Filled 2016-02-12: qty 2

## 2016-02-12 MED ORDER — DEXAMETHASONE SODIUM PHOSPHATE 4 MG/ML IJ SOLN
INTRAMUSCULAR | Status: DC | PRN
  Administered 2016-02-12: 10 mg via INTRAVENOUS

## 2016-02-12 SURGICAL SUPPLY — 50 items
ADH SKN CLS APL DERMABOND .7 (GAUZE/BANDAGES/DRESSINGS) ×1
BINDER BREAST LRG (GAUZE/BANDAGES/DRESSINGS) IMPLANT
BINDER BREAST MEDIUM (GAUZE/BANDAGES/DRESSINGS) IMPLANT
BINDER BREAST XLRG (GAUZE/BANDAGES/DRESSINGS) IMPLANT
BINDER BREAST XXLRG (GAUZE/BANDAGES/DRESSINGS) ×1 IMPLANT
BLADE SURG 15 STRL LF DISP TIS (BLADE) ×1 IMPLANT
BLADE SURG 15 STRL SS (BLADE) ×4
CANISTER SUC SOCK COL 7IN (MISCELLANEOUS) IMPLANT
CANISTER SUCT 1200ML W/VALVE (MISCELLANEOUS) IMPLANT
CHLORAPREP W/TINT 26ML (MISCELLANEOUS) ×2 IMPLANT
CLIP TI WIDE RED SMALL 6 (CLIP) ×1 IMPLANT
COVER BACK TABLE 60X90IN (DRAPES) ×2 IMPLANT
COVER MAYO STAND STRL (DRAPES) ×2 IMPLANT
COVER PROBE W GEL 5X96 (DRAPES) ×2 IMPLANT
DECANTER SPIKE VIAL GLASS SM (MISCELLANEOUS) IMPLANT
DERMABOND ADVANCED (GAUZE/BANDAGES/DRESSINGS) ×1
DERMABOND ADVANCED .7 DNX12 (GAUZE/BANDAGES/DRESSINGS) ×1 IMPLANT
DEVICE DUBIN W/COMP PLATE 8390 (MISCELLANEOUS) ×2 IMPLANT
DRAPE LAPAROSCOPIC ABDOMINAL (DRAPES) ×2 IMPLANT
DRAPE UTILITY XL STRL (DRAPES) ×2 IMPLANT
ELECT COATED BLADE 2.86 ST (ELECTRODE) ×2 IMPLANT
ELECT REM PT RETURN 9FT ADLT (ELECTROSURGICAL) ×2
ELECTRODE REM PT RTRN 9FT ADLT (ELECTROSURGICAL) ×1 IMPLANT
GLOVE BIOGEL PI IND STRL 7.0 (GLOVE) IMPLANT
GLOVE BIOGEL PI IND STRL 8 (GLOVE) ×1 IMPLANT
GLOVE BIOGEL PI INDICATOR 7.0 (GLOVE) ×2
GLOVE BIOGEL PI INDICATOR 8 (GLOVE) ×2
GLOVE ECLIPSE 6.5 STRL STRAW (GLOVE) ×1 IMPLANT
GLOVE ECLIPSE 7.5 STRL STRAW (GLOVE) ×3 IMPLANT
GLOVE SURG SS PI 8.0 STRL IVOR (GLOVE) ×1 IMPLANT
GOWN STRL REUS W/ TWL LRG LVL3 (GOWN DISPOSABLE) ×1 IMPLANT
GOWN STRL REUS W/ TWL XL LVL3 (GOWN DISPOSABLE) ×1 IMPLANT
GOWN STRL REUS W/TWL LRG LVL3 (GOWN DISPOSABLE) ×2
GOWN STRL REUS W/TWL XL LVL3 (GOWN DISPOSABLE) ×4
ILLUMINATOR WAVEGUIDE N/F (MISCELLANEOUS) IMPLANT
KIT MARKER MARGIN INK (KITS) ×2 IMPLANT
NDL HYPO 25X1 1.5 SAFETY (NEEDLE) ×1 IMPLANT
NEEDLE HYPO 25X1 1.5 SAFETY (NEEDLE) ×2 IMPLANT
NS IRRIG 1000ML POUR BTL (IV SOLUTION) IMPLANT
PACK BASIN DAY SURGERY FS (CUSTOM PROCEDURE TRAY) ×2 IMPLANT
PENCIL BUTTON HOLSTER BLD 10FT (ELECTRODE) ×2 IMPLANT
SLEEVE SCD COMPRESS KNEE MED (MISCELLANEOUS) ×2 IMPLANT
SPONGE LAP 4X18 X RAY DECT (DISPOSABLE) ×2 IMPLANT
SUT MON AB 5-0 PS2 18 (SUTURE) ×2 IMPLANT
SUT VICRYL 3-0 CR8 SH (SUTURE) ×2 IMPLANT
SYR CONTROL 10ML LL (SYRINGE) ×2 IMPLANT
TOWEL OR 17X24 6PK STRL BLUE (TOWEL DISPOSABLE) ×2 IMPLANT
TOWEL OR NON WOVEN STRL DISP B (DISPOSABLE) ×2 IMPLANT
TUBE CONNECTING 20X1/4 (TUBING) IMPLANT
YANKAUER SUCT BULB TIP NO VENT (SUCTIONS) IMPLANT

## 2016-02-12 NOTE — Anesthesia Postprocedure Evaluation (Signed)
Anesthesia Post Note  Patient: Hannah Scott  Procedure(s) Performed: Procedure(s) (LRB): LEFT BREAST LUMPECTOMY WITH RADIOACTIVE SEED LOCALIZATION (Left)  Patient location during evaluation: PACU Anesthesia Type: General Level of consciousness: awake and alert Pain management: pain level controlled Vital Signs Assessment: post-procedure vital signs reviewed and stable Respiratory status: spontaneous breathing, nonlabored ventilation, respiratory function stable and patient connected to nasal cannula oxygen Cardiovascular status: blood pressure returned to baseline and stable Postop Assessment: no signs of nausea or vomiting Anesthetic complications: no    Last Vitals:  Vitals:   02/12/16 0845 02/12/16 0900  BP: (!) 141/66 139/73  Pulse: 74 76  Resp: 19 13  Temp:      Last Pain:  Vitals:   02/12/16 0900  TempSrc:   PainSc: 0-No pain                 Tiajuana Amass

## 2016-02-12 NOTE — Anesthesia Procedure Notes (Signed)
Procedure Name: LMA Insertion Date/Time: 02/12/2016 7:33 AM Performed by: Lyndee Leo Pre-anesthesia Checklist: Patient identified, Emergency Drugs available, Suction available and Patient being monitored Patient Re-evaluated:Patient Re-evaluated prior to inductionOxygen Delivery Method: Circle system utilized Preoxygenation: Pre-oxygenation with 100% oxygen Intubation Type: IV induction Ventilation: Mask ventilation without difficulty LMA: LMA inserted LMA Size: 3.0 Number of attempts: 1 Airway Equipment and Method: Bite block Placement Confirmation: positive ETCO2 Tube secured with: Tape Dental Injury: Teeth and Oropharynx as per pre-operative assessment

## 2016-02-12 NOTE — Op Note (Signed)
Preoperative Diagnosis: LEFT BREAST PAPILLOMA  Postoprative Diagnosis: LEFT BREAST PAPILLOMA  Procedure: Procedure(s): LEFT BREAST LUMPECTOMY WITH RADIOACTIVE SEED LOCALIZATION   Surgeon: Excell Seltzer T   Assistants: None  Anesthesia:  General LMA anesthesia  Indications: Patient has a personal history of DCIS of the right breast and presents with persistent left nipple discharge. Ultrasound revealed an intraductal mass and large core needle biopsy returned showing papilloma. After discussion of options detailed elsewhere we have elected to proceed with radioactive seed localized lumpectomy of this area to rule out underlying malignancy.    Procedure Detail:  Patient had undergone accurate radioactive seed localization a day or 2 prior to the procedure. The seed was confirmed in the left breast in the holding area with the neoprobe. She was taken to the operating room, placed in the supine position on the operating table, and laryngeal mask general anesthesia induced. She received preoperative IV antibiotics. PAS were in place. The left breast was widely sterilely prepped and draped. Patient timeout was performed and correct procedure verified. The seed location was verified with the neoprobe and a circumareolar incision was made laterally and dissection carried down into the subcutaneous tissue. The dissection was widened and I did identify a dilated blood filled duct leading up to the nipple which was transected near the nipple and included with the specimen. Using the neoprobe for guidance a generous, approximately 3-4 cm area of tissue was excised with cautery. The tissue was very firm and fibrotic. The specimen was inked for margins and specimen x-ray showed the seed and marking clip centrally located as well as a second marking clip from the biopsy showing fibroadenoma also occluded at the periphery of the specimen. This was sent for permanent pathology. Hemostasis was obtained with  cautery and Vicryl sutures with complete hemostasis assured. The breast and subcutaneous tissue was closed with interrupted 3-0 Vicryl and the skin with running subcuticular 5-0 Monocryl and Dermabond. Sponge needle and instrument counts were correct.    Findings: As above  Estimated Blood Loss:  Minimal         Drains: None  Blood Given: none          Specimens: Left breast tissue oriented        Complications:  * No complications entered in OR log *         Disposition: PACU - hemodynamically stable.         Condition: stable

## 2016-02-12 NOTE — Discharge Instructions (Signed)
°Post Anesthesia Home Care Instructions ° °Activity: °Get plenty of rest for the remainder of the day. A responsible adult should stay with you for 24 hours following the procedure.  °For the next 24 hours, DO NOT: °-Drive a car °-Operate machinery °-Drink alcoholic beverages °-Take any medication unless instructed by your physician °-Make any legal decisions or sign important papers. ° °Meals: °Start with liquid foods such as gelatin or soup. Progress to regular foods as tolerated. Avoid greasy, spicy, heavy foods. If nausea and/or vomiting occur, drink only clear liquids until the nausea and/or vomiting subsides. Call your physician if vomiting continues. ° °Special Instructions/Symptoms: °Your throat may feel dry or sore from the anesthesia or the breathing tube placed in your throat during surgery. If this causes discomfort, gargle with warm salt water. The discomfort should disappear within 24 hours. ° °If you had a scopolamine patch placed behind your ear for the management of post- operative nausea and/or vomiting: ° °1. The medication in the patch is effective for 72 hours, after which it should be removed.  Wrap patch in a tissue and discard in the trash. Wash hands thoroughly with soap and water. °2. You may remove the patch earlier than 72 hours if you experience unpleasant side effects which may include dry mouth, dizziness or visual disturbances. °3. Avoid touching the patch. Wash your hands with soap and water after contact with the patch. °  ° ° ° ° ° ° ° ° °[facility] °Office Phone Number [phone] ° °BREAST BIOPSY/ PARTIAL MASTECTOMY: POST OP INSTRUCTIONS ° °Always review your discharge instruction sheet given to you by the facility where your surgery was performed. ° °IF YOU HAVE DISABILITY OR FAMILY LEAVE FORMS, YOU MUST BRING THEM TO THE OFFICE FOR PROCESSING.  DO NOT GIVE THEM TO YOUR DOCTOR. ° °1. A prescription for pain medication may be given to you upon discharge.  Take  your pain medication as prescribed, if needed.  If narcotic pain medicine is not needed, then you may take acetaminophen (Tylenol) or ibuprofen (Advil) as needed. °2. Take your usually prescribed medications unless otherwise directed °3. If you need a refill on your pain medication, please contact your pharmacy.  They will contact our office to request authorization.  Prescriptions will not be filled after 5pm or on week-ends. °4. You should eat very light the first 24 hours after surgery, such as soup, crackers, pudding, etc.  Resume your normal diet the day after surgery. °5. Most patients will experience some swelling and bruising in the breast.  Ice packs and a good support bra will help.  Swelling and bruising can take several days to resolve.  °6. It is common to experience some constipation if taking pain medication after surgery.  Increasing fluid intake and taking a stool softener will usually help or prevent this problem from occurring.  A mild laxative (Milk of Magnesia or Miralax) should be taken according to package directions if there are no bowel movements after 48 hours. °7. Unless discharge instructions indicate otherwise, you may remove your bandages 24-48 hours after surgery, and you may shower at that time.  You may have steri-strips (small skin tapes) in place directly over the incision.  These strips should be left on the skin for 7-10 days.  If your surgeon used skin glue on the incision, you may shower in 24 hours.  The glue will flake off over the next 2-3 weeks.  Any sutures or staples will be removed at the office   may resume regular daily activities (gradually increasing) beginning the next day.  Wearing a good support bra or sports bra minimizes pain and swelling.  You may have sexual intercourse when it is comfortable. a. You may drive when you no longer are taking prescription pain medication, you can comfortably wear a seatbelt, and you can  safely maneuver your car and apply brakes. b. RETURN TO WORK:  ______________________________________________________________________________________ 9. You should see your doctor in the office for a follow-up appointment approximately two weeks after your surgery.  Your doctors nurse will typically make your follow-up appointment when she calls you with your pathology report.  Expect your pathology report 2-3 business days after your surgery.  You may call to check if you do not hear from [place] after three days. 10. OTHER INSTRUCTIONS: _______________________________________________________________________________________________ _____________________________________________________________________________________________________________________________________ _____________________________________________________________________________________________________________________________________ _____________________________________________________________________________________________________________________________________  WHEN TO CALL YOUR DOCTOR: 1. Fever over 101.0 2. Nausea and/or vomiting. 3. Extreme swelling or bruising. 4. Continued bleeding from incision. 5. Increased pain, redness, or drainage from the incision.  The clinic staff is available to answer your questions during regular business hours.  Please dont hesitate to call and ask to speak to one of the nurses for clinical concerns.  If you have a medical emergency, go to the nearest emergency room or call 911.  A surgeon from [facility] is always on call at the hospital.  For further questions, please visit [email or web address]    Postoperative Anesthesia Instructions-Pediatric  Activity: Your child should rest for the remainder of the day. A responsible adult should stay with your child for 24 hours.  Meals: Your child should start with liquids and light foods such as gelatin or soup unless otherwise  instructed by the physician. Progress to regular foods as tolerated. Avoid spicy, greasy, and heavy foods. If nausea and/or vomiting occur, drink only clear liquids such as apple juice or Pedialyte until the nausea and/or vomiting subsides. Call your physician if vomiting continues.  Special Instructions/Symptoms: Your child may be drowsy for the rest of the day, although some children experience some hyperactivity a few hours after the surgery. Your child may also experience some irritability or crying episodes due to the operative procedure and/or anesthesia. Your child's throat may feel dry or sore from the anesthesia or the breathing tube placed in the throat during surgery. Use throat lozenges, sprays, or ice chips if needed.

## 2016-02-12 NOTE — Anesthesia Preprocedure Evaluation (Signed)
Anesthesia Evaluation  Patient identified by MRN, date of birth, ID band Patient awake    Reviewed: Allergy & Precautions, NPO status , Patient's Chart, lab work & pertinent test results, reviewed documented beta blocker date and time   Airway Mallampati: II  TM Distance: >3 FB Neck ROM: Full    Dental  (+) Dental Advisory Given   Pulmonary asthma , COPD,    breath sounds clear to auscultation       Cardiovascular hypertension, Pt. on medications and Pt. on home beta blockers + CAD and + DOE   Rhythm:Regular Rate:Normal     Neuro/Psych  Headaches, Depression    GI/Hepatic Neg liver ROS, hiatal hernia, GERD  ,  Endo/Other  Hypothyroidism   Renal/GU negative Renal ROS     Musculoskeletal  (+) Arthritis ,   Abdominal   Peds  Hematology negative hematology ROS (+)   Anesthesia Other Findings   Reproductive/Obstetrics                             Anesthesia Physical Anesthesia Plan  ASA: III  Anesthesia Plan: General   Post-op Pain Management:    Induction: Intravenous  Airway Management Planned: LMA  Additional Equipment:   Intra-op Plan:   Post-operative Plan: Extubation in OR  Informed Consent: I have reviewed the patients History and Physical, chart, labs and discussed the procedure including the risks, benefits and alternatives for the proposed anesthesia with the patient or authorized representative who has indicated his/her understanding and acceptance.   Dental advisory given  Plan Discussed with:   Anesthesia Plan Comments:         Anesthesia Quick Evaluation

## 2016-02-12 NOTE — Transfer of Care (Signed)
Immediate Anesthesia Transfer of Care Note  Patient: Hannah Scott  Procedure(s) Performed: Procedure(s): LEFT BREAST LUMPECTOMY WITH RADIOACTIVE SEED LOCALIZATION (Left)  Patient Location: PACU  Anesthesia Type:General  Level of Consciousness: awake, sedated and patient cooperative  Airway & Oxygen Therapy: Patient Spontanous Breathing and Patient connected to face mask oxygen  Post-op Assessment: Report given to RN and Post -op Vital signs reviewed and stable  Post vital signs: Reviewed and stable  Last Vitals:  Vitals:   02/12/16 0628  BP: (!) 161/79  Pulse: 69  Temp: 36.9 C    Last Pain:  Vitals:   02/12/16 0628  TempSrc: Oral  PainSc: 6       Patients Stated Pain Goal: 5 ([id number])  Complications: No apparent anesthesia complications

## 2016-02-12 NOTE — H&P (Signed)
History of Present Illness  Patient words: Left breast papilloma.  The patient is a 65 year old female who presents with a complaint of Breast problems. She returns to the office referred by Dr. Marcelo Baldy for recent abnormal ultrasound and biopsy revealing papilloma. She has a history of DCIS of the right breast treated with lumpectomy and radiation therapy in 2003. She has not had any breast problems since. A couple of months ago she noted some spontaneous brownish discharge from the left breast. She subsequently presented for imaging at [facility]. Digital tomosynthesis imaging showed category B density with no mammographic abnormality. A biopsy clip was noted. She subsequently underwent ultrasound which revealed a dilated duct in the left breast upper outer quadrant anterior depth with internal echoes. This was felt to be suspicious for a papilloma and ultrasound-guided biopsy was recommended. At the time of biopsy there were apparently 2 adjacent areas seen at 3:00 and 2:00. At 3:00 biopsy was obtained showing fibroadenoma and at 2:00 biopsy was obtained showing fibrotic ductal papilloma. From the report I'm not sure which marking clip is in the papilloma. She has not had any discharge in the last several weeks. Has not felt any breast lumps or had any skin changes.   Other Problems  Arthritis Asthma Back Pain Breast Cancer Depression Gastroesophageal Reflux Disease High blood pressure Migraine Headache Myocardial infarction Thyroid Disease  Past Surgical History  Breast Mass; Local Excision Right. Cataract Surgery Bilateral. Hip Surgery Right. Hysterectomy (not due to cancer) - Complete Spinal Surgery - Lower Back  Diagnostic Studies History  Colonoscopy >10 years ago Mammogram within last year Pap Smear 1-5 years ago  Allergies No Known Drug Allergies  Medication History  Hydrocodone-Acetaminophen (10-325MG  Tablet, Oral 2-3 a day)  Active. Atorvastatin Calcium (40MG  Tablet, Oral) Active. Metoprolol Tartrate (50MG  Tablet, Oral two times daily) Active. Benazepril HCl (40MG  Tablet, Oral) Active. Levothyroxine Sodium (75MCG Tablet, Oral) Active. Gabapentin (400MG  Capsule, Oral) Active. Aspirin (81MG  Tablet, Oral) Active. Medications Reconciled  Social Histor Caffeine use Carbonated beverages, Coffee, Tea. No alcohol use No drug use Tobacco use Never smoker.  Family History Alcohol Abuse Father. Arthritis Father, Mother. Heart Disease Father, Mother. Heart disease in female family member before age 13 Hypertension Mother. Migraine Headache Mother. Thyroid problems Mother.  Pregnancy / Birth History  Age at menarche 45 years. Age of menopause <44 Gravida 2 Maternal age 74-25 Para 2  Vitals   Weight: 158.8 lb Height: 60in Height was reported by patient. Body Surface Area: 1.69 m Body Mass Index: 31.01 kg/m  Temp.: 97.58F  Pulse: 76 (Regular)  BP: 130/90 (Sitting, Left Arm, Standard)       Physical Exam  The physical exam findings are as follows: Note:General: Alert, mildly obese Caucasian female, in no distress Skin: Warm and dry without rash or infection. HEENT: No palpable masses or thyromegaly. Sclera nonicteric. Pupils equal round and reactive. Lymph nodes: No cervical, supraclavicular, or inguinal nodes palpable. Breasts: On the right there is a inframammary healed lumpectomy scar with some slight thickening and some nipple inversion. On the left I cannot feel any masses. No skin changes or nipple crusting. No discharge elicited today. Lungs: Breath sounds clear and equal. No wheezing or increased work of breathing. Significant scoliosis. Cardiovascular: Regular rate and rhythm without murmer. No JVD or edema. Extremities: No edema or joint swelling or deformity. No chronic venous stasis changes. Neurologic: Alert and fully oriented. Walks with a walker due  to scoliosis . No focal weakness. Psychiatric: Normal mood and  affect. Thought content appropriate with normal judgement and insight    Assessment & Plan  PAPILLOMA OF LEFT BREAST (D24.2) Impression: Recent left nipple discharge and large core needle biopsy showing ductal papilloma. Personal history of DCIS of the right breast remotely. I discussed the diagnosis and implications with the patient. We discussed that there is a small, probably less than 10% incidence of underlying early cancer. I discussed options of close imaging follow-up versus excision. We discussed that excision is the general standard. Particularly with her personal history of breast cancer she feels strongly she would like this removed and I think this is reasonable. We discussed radioactive seed localized lumpectomy including the nature of surgery, expected recovery, risks of bleeding, infection, anesthetic complications or need for further surgery based on final diagnosis. She has a history of MI and we will obtain preoperative cardiac clearance. She already has an appointment with her cardiologist in about 2 weeks. Current Plans I recommended obtaining preoperative cardiac clearance. I am concerned about the health of the patient and the ability to tolerate the operation. Therefore, we will request clearance by cardiology to better assess operative risk & see if a reevaluation, further workup, etc is needed. Also recommendations on how medications such as for anticoagulation and blood pressure should be managed/held/restarted after surgery. Radioactive seed localized left breast lumpectomy under general anesthesia as an outpatient

## 2016-02-12 NOTE — Interval H&P Note (Signed)
History and Physical Interval Note:  02/12/2016 7:17 AM  Hannah Scott  has presented today for surgery, with the diagnosis of LEFT BREAST PAPILLOMA  The various methods of treatment have been discussed with the patient and family. After consideration of risks, benefits and other options for treatment, the patient has consented to  Procedure(s): LEFT BREAST LUMPECTOMY WITH RADIOACTIVE SEED LOCALIZATION (Left) as a surgical intervention .  The patient's history has been reviewed, patient examined, no change in status, stable for surgery.  I have reviewed the patient's chart and labs.  Questions were answered to the patient's satisfaction.     Zafir Schauer T

## 2016-02-15 ENCOUNTER — Encounter (HOSPITAL_BASED_OUTPATIENT_CLINIC_OR_DEPARTMENT_OTHER): Payer: Self-pay | Admitting: General Surgery

## 2016-02-16 ENCOUNTER — Other Ambulatory Visit: Payer: Self-pay | Admitting: Internal Medicine

## 2016-02-17 ENCOUNTER — Encounter: Payer: Medicare Other | Attending: Physical Medicine & Rehabilitation | Admitting: Registered Nurse

## 2016-02-17 ENCOUNTER — Encounter: Payer: Self-pay | Admitting: Registered Nurse

## 2016-02-17 VITALS — BP 138/92 | HR 74 | Resp 14

## 2016-02-17 DIAGNOSIS — G894 Chronic pain syndrome: Secondary | ICD-10-CM

## 2016-02-17 DIAGNOSIS — M961 Postlaminectomy syndrome, not elsewhere classified: Secondary | ICD-10-CM | POA: Diagnosis not present

## 2016-02-17 DIAGNOSIS — M24551 Contracture, right hip: Secondary | ICD-10-CM | POA: Diagnosis not present

## 2016-02-17 DIAGNOSIS — M4126 Other idiopathic scoliosis, lumbar region: Secondary | ICD-10-CM | POA: Diagnosis not present

## 2016-02-17 DIAGNOSIS — M419 Scoliosis, unspecified: Secondary | ICD-10-CM

## 2016-02-17 DIAGNOSIS — M48062 Spinal stenosis, lumbar region with neurogenic claudication: Secondary | ICD-10-CM | POA: Diagnosis not present

## 2016-02-17 DIAGNOSIS — M48061 Spinal stenosis, lumbar region without neurogenic claudication: Secondary | ICD-10-CM | POA: Insufficient documentation

## 2016-02-17 DIAGNOSIS — M1611 Unilateral primary osteoarthritis, right hip: Secondary | ICD-10-CM | POA: Insufficient documentation

## 2016-02-17 DIAGNOSIS — Z5181 Encounter for therapeutic drug level monitoring: Secondary | ICD-10-CM

## 2016-02-17 DIAGNOSIS — Z79899 Other long term (current) drug therapy: Secondary | ICD-10-CM

## 2016-02-17 MED ORDER — HYDROCODONE-ACETAMINOPHEN 10-325 MG PO TABS: 1.0000 | ORAL_TABLET | Freq: Four times a day (QID) | ORAL | 0 refills | Status: DC | PRN

## 2016-02-17 NOTE — Progress Notes (Signed)
Subjective:    Patient ID: Hannah Scott, female    DOB: 1950/12/31, 65 y.o.   MRN: [id number]  HPI: Hannah Scott is a 65 year old female who returns for follow up for chronic pain and medication refill. She states her pain is located in her lower back mainly right side and occasionally in her right hip. She rates her pain 6. Her current exercise regime is walking for short distances using her cadillac walker or cane in her home and using her stationary bicycle weekly for 5-10 minutes..  Hannah Scott had left breast lumpectomy with radioactive seed localization on 02/12/2016 by Dr. Excell Seltzer. She was given Hydrocodone prescription, she never filled it. NCCSR was reviewed, prescription not filled.   Pain Inventory Average Pain 6 Pain Right Now 6 My pain is constant, burning, tingling and aching  In the last 24 hours, has pain interfered with the following? General activity 10 Relation with others 10 Enjoyment of life 10 What TIME of day is your pain at its worst? all Sleep (in general) Poor  Pain is worse with: walking and standing Pain improves with: rest and medication Relief from Meds: 5  Mobility use a cane use a walker ability to climb steps?  yes do you drive?  yes  Function disabled: date disabled n/a  Neuro/Psych weakness numbness tingling depression  Prior Studies Any changes since last visit?  no  Physicians involved in your care Any changes since last visit?  no   Family History  Problem Relation Age of Onset  . Heart failure Mother   . Pneumonia Mother   . Hypertension Mother   . Heart disease Mother   . Stroke Maternal Grandfather   . Hypertension Maternal Grandfather   . Heart attack Father   . Heart disease Father   . Asthma Paternal Uncle     PAT UNCLES  . Heart attack Paternal Uncle    Social History   Social History  . Marital status: Married    Spouse name: N/A  . Number of children: N/A  . Years of education: N/A   Social History  Main Topics  . Smoking status: Never Smoker  . Smokeless tobacco: Never Used  . Alcohol use No  . Drug use: No  . Sexual activity: No   Other Topics Concern  . Not on file   Social History Narrative  . No narrative on file   Past Surgical History:  Procedure Laterality Date  . ANKLE SURGERY Left    ligament  . APPENDECTOMY  1987   AT [facility]  . BACK SURGERY     Fusion  . BREAST LUMPECTOMY  2003   radiation on right  . BREAST LUMPECTOMY WITH RADIOACTIVE SEED LOCALIZATION Left 02/12/2016   Procedure: LEFT BREAST LUMPECTOMY WITH RADIOACTIVE SEED LOCALIZATION;  Surgeon: Excell Seltzer, MD;  Location: [facility];  Service: General;  Laterality: Left;  . CARDIOVASCULAR STRESS TEST  09/07/05   Nuclear, was negative  . CATARACT EXTRACTION Bilateral 01,03  . OOPHORECTOMY     LSO -RSO  . PELVIC LAPAROSCOPY  1989   RSO, LYSIS OF ADHESIONS  . TOTAL ABDOMINAL HYSTERECTOMY  1987   LSO, APPENDECTOMY  . TOTAL HIP ARTHROPLASTY Right 10/28/2013   dr Lorin Mercy  . TOTAL HIP ARTHROPLASTY Right 10/28/2013   Procedure: TOTAL HIP ARTHROPLASTY ANTERIOR APPROACH;  Surgeon: Marybelle Killings, MD;  Location: [place];  Service: Orthopedics;  Laterality: Right;  Right Total Hip Arthroplasty, Direct Anterior Approach  Past Medical History:  Diagnosis Date  . Anemia    hx  . Arthritis   . Asthma    PFTs, February, 2011, moderate obstructive disease with response to bronchodilators, normal lung volumes, moderate reduction in diffusing capacity  . Atrial septal aneurysm    Echo, 2008-not noted on 13 echo  . CAD (coronary artery disease)    90% distal LAD in the past  /   nuclear, 2008, no ischemia, ejection fraction 70%  . Cancer ([place]) 2002   DUCTAL CIS--S/P LUMPECTOMY, RADIATION AND 6 WEEKS OF TAMOXIFEN  . D-dimer, elevated    January, 2014  . Depression   . Ejection fraction    EF 60%, echo, October, 2008  . Elevated CPK    January, 2014  . Endometriosis 1989   RIGHT TUBE  .  Endometriosis 1987   LEFT TUBE/OVARY W FOCAL IN-SITU ENDOMETRIAL ADENOCARCINOMA  . GERD (gastroesophageal reflux disease)    occ  . H/O hiatal hernia    ?  [name] Hyperlipidemia   . Hypertension   . Hypothyroidism    Patient has had in the past that she does not need treatment  . Kyphoscoliosis   . Obstructive airway disease ([place])   . Pinched nerve    lower back  . Shortness of breath   . UTI (lower urinary tract infection)    There were no vitals taken for this visit.  Opioid Risk Score:   Fall Risk Score:  `1  Depression screen PHQ 2/9  Depression screen [facility] 2/9 01/06/2016 07/11/2014  Decreased Interest 0 3  Down, Depressed, Hopeless 1 2  PHQ - 2 Score 1 5  Altered sleeping - 3  Tired, decreased energy - 3  Change in appetite - 0  Feeling bad or failure about yourself  - 0  Trouble concentrating - 0  Moving slowly or fidgety/restless - 0  Suicidal thoughts - 0  PHQ-9 Score - 11  Some recent data might be hidden    Review of Systems  All other systems reviewed and are negative.      Objective:   Physical Exam  Constitutional: She is oriented to person, place, and time. She appears well-developed and well-nourished.  HENT:  Head: Normocephalic and atraumatic.  Neck: Normal range of motion. Neck supple.  Cardiovascular: Normal rate and regular rhythm.   Pulmonary/Chest: Effort normal and breath sounds normal.  Musculoskeletal:  Normal Muscle Bulk and Muscle Testing Reveals: Upper Extremities: Full ROM and Muscle Strength 5/5 Back without spinal tenderness Lumbar Scoliosis Lower Extremities: Full ROM and Muscle Strength 5/5 Arises from Table slowly using walker for support Narrow Based gait  Neurological: She is alert and oriented to person, place, and time.  Skin: Skin is warm and dry.  Psychiatric: She has a normal mood and affect.  Nursing note and vitals reviewed.         Assessment & Plan:  1 Lumbar post laminectomy syndrome, s/p lumbar fusion:   Refilled: Hydrocodone 10/325mg  one tablet every 6 hours #100. We will continue the opioid monitoring program, this consists of regular clinic visits, examinations, urine drug screen, pill counts as well as use of [place] Controlled Substance Reporting system. 2. Right Hip OA: S/P Right Hip Replacement 10/28/2013. 3.Depression: Has Family Support and Friends. Counseling with Doristine Bosworth. 4. Right Greater Trochanteric Tenderness: No Complaints Today. Continue with Ice and Heat Therapy.   20 minutes of face to face patient care time was spent during this visit. All questions were encouraged and  answered.  F/U in 1 month

## 2016-03-08 ENCOUNTER — Other Ambulatory Visit: Payer: Self-pay | Admitting: Endocrinology

## 2016-03-14 ENCOUNTER — Ambulatory Visit (HOSPITAL_BASED_OUTPATIENT_CLINIC_OR_DEPARTMENT_OTHER): Payer: Medicare Other | Admitting: Physical Medicine & Rehabilitation

## 2016-03-14 ENCOUNTER — Encounter: Payer: Medicare Other | Attending: Physical Medicine & Rehabilitation

## 2016-03-14 ENCOUNTER — Encounter: Payer: Self-pay | Admitting: Physical Medicine & Rehabilitation

## 2016-03-14 VITALS — BP 129/83 | HR 74 | Resp 14

## 2016-03-14 DIAGNOSIS — M961 Postlaminectomy syndrome, not elsewhere classified: Secondary | ICD-10-CM | POA: Insufficient documentation

## 2016-03-14 DIAGNOSIS — M1611 Unilateral primary osteoarthritis, right hip: Secondary | ICD-10-CM | POA: Diagnosis not present

## 2016-03-14 DIAGNOSIS — M48061 Spinal stenosis, lumbar region without neurogenic claudication: Secondary | ICD-10-CM | POA: Diagnosis not present

## 2016-03-14 DIAGNOSIS — M48062 Spinal stenosis, lumbar region with neurogenic claudication: Secondary | ICD-10-CM

## 2016-03-14 DIAGNOSIS — M24551 Contracture, right hip: Secondary | ICD-10-CM

## 2016-03-14 DIAGNOSIS — M4126 Other idiopathic scoliosis, lumbar region: Secondary | ICD-10-CM | POA: Insufficient documentation

## 2016-03-14 MED ORDER — HYDROCODONE-ACETAMINOPHEN 10-325 MG PO TABS: 1.0000 | ORAL_TABLET | Freq: Four times a day (QID) | ORAL | 0 refills | Status: DC | PRN

## 2016-03-14 NOTE — Progress Notes (Signed)
Subjective:    Patient ID: Hannah Scott, female    DOB: 1951/04/14, 65 y.o.   MRN: [id number]  HPI  65 year old female with chronic low back pain. She has been on long-term hydrocodone taking 3-4 tablets per day, 100 tablets per month. Has a history of hip replacement 10/28/2013, but this is not her primary pain. Interval history positive for left breast lumpectomy, Dr. Excell Seltzer.  Patient uses cane for short distance ambulation and wheeled walker for longer distances. Independent with dressing, bathing Patient does light housekeeping as well as cooking. Children help her with vacuuming Pain Inventory Average Pain 7 Pain Right Now 7 My pain is constant, burning, tingling and aching  In the last 24 hours, has pain interfered with the following? General activity 10 Relation with others 10 Enjoyment of life 10 What TIME of day is your pain at its worst? all Sleep (in general) Fair  Pain is worse with: walking and standing Pain improves with: rest and medication Relief from Meds: 4  Mobility walk with assistance use a cane use a walker ability to climb steps?  yes do you drive?  yes  Function disabled: date disabled n/a I need assistance with the following:  household duties  Neuro/Psych weakness numbness trouble walking depression  Prior Studies Any changes since last visit?  no  Physicians involved in your care Any changes since last visit?  no   Family History  Problem Relation Age of Onset  . Heart failure Mother   . Pneumonia Mother   . Hypertension Mother   . Heart disease Mother   . Stroke Maternal Grandfather   . Hypertension Maternal Grandfather   . Heart attack Father   . Heart disease Father   . Asthma Paternal Uncle     PAT UNCLES  . Heart attack Paternal Uncle    Social History   Social History  . Marital status: Married    Spouse name: N/A  . Number of children: N/A  . Years of education: N/A   Social History Main Topics  . Smoking  status: Never Smoker  . Smokeless tobacco: Never Used  . Alcohol use No  . Drug use: No  . Sexual activity: No   Other Topics Concern  . None   Social History Narrative  . None   Past Surgical History:  Procedure Laterality Date  . ANKLE SURGERY Left    ligament  . APPENDECTOMY  1987   AT [facility]  . BACK SURGERY     Fusion  . BREAST LUMPECTOMY  2003   radiation on right  . BREAST LUMPECTOMY WITH RADIOACTIVE SEED LOCALIZATION Left 02/12/2016   Procedure: LEFT BREAST LUMPECTOMY WITH RADIOACTIVE SEED LOCALIZATION;  Surgeon: Excell Seltzer, MD;  Location: [facility];  Service: General;  Laterality: Left;  . CARDIOVASCULAR STRESS TEST  09/07/05   Nuclear, was negative  . CATARACT EXTRACTION Bilateral 01,03  . OOPHORECTOMY     LSO -RSO  . PELVIC LAPAROSCOPY  1989   RSO, LYSIS OF ADHESIONS  . TOTAL ABDOMINAL HYSTERECTOMY  1987   LSO, APPENDECTOMY  . TOTAL HIP ARTHROPLASTY Right 10/28/2013   dr Lorin Mercy  . TOTAL HIP ARTHROPLASTY Right 10/28/2013   Procedure: TOTAL HIP ARTHROPLASTY ANTERIOR APPROACH;  Surgeon: Marybelle Killings, MD;  Location: [facility];  Service: Orthopedics;  Laterality: Right;  Right Total Hip Arthroplasty, Direct Anterior Approach   Past Medical History:  Diagnosis Date  . Anemia    hx  . Arthritis   .  Asthma    PFTs, February, 2011, moderate obstructive disease with response to bronchodilators, normal lung volumes, moderate reduction in diffusing capacity  . Atrial septal aneurysm    Echo, 2008-not noted on 13 echo  . CAD (coronary artery disease)    90% distal LAD in the past  /   nuclear, 2008, no ischemia, ejection fraction 70%  . Cancer ([place]) 2002   DUCTAL CIS--S/P LUMPECTOMY, RADIATION AND 6 WEEKS OF TAMOXIFEN  . D-dimer, elevated    January, 2014  . Depression   . Ejection fraction    EF 60%, echo, October, 2008  . Elevated CPK    January, 2014  . Endometriosis 1989   RIGHT TUBE  . Endometriosis 1987   LEFT TUBE/OVARY W FOCAL IN-SITU  ENDOMETRIAL ADENOCARCINOMA  . GERD (gastroesophageal reflux disease)    occ  . H/O hiatal hernia    ?  [name] Hyperlipidemia   . Hypertension   . Hypothyroidism    Patient has had in the past that she does not need treatment  . Kyphoscoliosis   . Obstructive airway disease ([place])   . Pinched nerve    lower back  . Shortness of breath   . UTI (lower urinary tract infection)    BP 129/83   Pulse 74   Resp 14   SpO2 97%   Opioid Risk Score:   Fall Risk Score:  `1  Depression screen PHQ 2/9  Depression screen [facility] 2/9 01/06/2016 07/11/2014  Decreased Interest 0 3  Down, Depressed, Hopeless 1 2  PHQ - 2 Score 1 5  Altered sleeping - 3  Tired, decreased energy - 3  Change in appetite - 0  Feeling bad or failure about yourself  - 0  Trouble concentrating - 0  Moving slowly or fidgety/restless - 0  Suicidal thoughts - 0  PHQ-9 Score - 11  Some recent data might be hidden      Review of Systems  Constitutional: Negative.   HENT: Negative.   Eyes: Negative.   Respiratory: Negative.   Cardiovascular: Negative.   Gastrointestinal: Negative.   Endocrine: Negative.   Genitourinary: Negative.   Musculoskeletal: Negative.   Skin: Negative.   Allergic/Immunologic: Negative.   Neurological: Negative.   Hematological: Negative.   Psychiatric/Behavioral: Negative.   All other systems reviewed and are negative.      Objective:   Physical Exam  Constitutional: She is oriented to person, place, and time. She appears well-developed and well-nourished.  HENT:  Head: Normocephalic and atraumatic.  Eyes: Conjunctivae and EOM are normal. Pupils are equal, round, and reactive to light.  Neck: Normal range of motion.  Musculoskeletal: She exhibits deformity.  Neurological: She is alert and oriented to person, place, and time. Gait abnormal.  Psychiatric: She has a normal mood and affect.  Nursing note and vitals reviewed.   Mild left hip , reduced internal and external  rotation Right hip : Internal and external rotation. Right hip still has flexion contracture. levoconvex thoracic scoliosis,with kyphosis     Assessment & Plan:  1. Lumbar postlaminectomy syndrome with chronic back pain. Also has severe juvenile onset scoliosis. Continue hydrocodone 10/325 3 -4 tablets per day, 100 tablets per month Continue opioid monitoring program. This consists of regular clinic visits, examinations, urine drug screen, pill counts as well as use of [place] controlled substance reporting System. Last UDS 09/28/2015. Appropriate  2. Chronic radicular pain. Has had some issues with both legs feeling like they need to move. This symptom  is improved by gabapentin, but over time has recurred. If this persists, would recommend increasing gabapentin to 600 mg at bedtime  3. Right hip contracture. This is flexion. Her internal/external rotation have improved to normal following her total hip replacement approximately 2 years ago

## 2016-04-02 ENCOUNTER — Other Ambulatory Visit: Payer: Self-pay | Admitting: Internal Medicine

## 2016-04-02 DIAGNOSIS — J4521 Mild intermittent asthma with (acute) exacerbation: Secondary | ICD-10-CM

## 2016-04-07 ENCOUNTER — Encounter: Payer: Medicare Other | Attending: Physical Medicine & Rehabilitation | Admitting: Registered Nurse

## 2016-04-07 ENCOUNTER — Encounter: Payer: Self-pay | Admitting: Registered Nurse

## 2016-04-07 VITALS — BP 137/79 | HR 76 | Resp 14

## 2016-04-07 DIAGNOSIS — M48062 Spinal stenosis, lumbar region with neurogenic claudication: Secondary | ICD-10-CM | POA: Diagnosis not present

## 2016-04-07 DIAGNOSIS — M24551 Contracture, right hip: Secondary | ICD-10-CM

## 2016-04-07 DIAGNOSIS — M48061 Spinal stenosis, lumbar region without neurogenic claudication: Secondary | ICD-10-CM | POA: Diagnosis not present

## 2016-04-07 DIAGNOSIS — G894 Chronic pain syndrome: Secondary | ICD-10-CM

## 2016-04-07 DIAGNOSIS — M4126 Other idiopathic scoliosis, lumbar region: Secondary | ICD-10-CM | POA: Insufficient documentation

## 2016-04-07 DIAGNOSIS — M961 Postlaminectomy syndrome, not elsewhere classified: Secondary | ICD-10-CM | POA: Diagnosis not present

## 2016-04-07 DIAGNOSIS — Z79899 Other long term (current) drug therapy: Secondary | ICD-10-CM

## 2016-04-07 DIAGNOSIS — Z5181 Encounter for therapeutic drug level monitoring: Secondary | ICD-10-CM

## 2016-04-07 DIAGNOSIS — M419 Scoliosis, unspecified: Secondary | ICD-10-CM | POA: Diagnosis not present

## 2016-04-07 DIAGNOSIS — M1611 Unilateral primary osteoarthritis, right hip: Secondary | ICD-10-CM | POA: Insufficient documentation

## 2016-04-07 MED ORDER — HYDROCODONE-ACETAMINOPHEN 10-325 MG PO TABS: 1.0000 | ORAL_TABLET | Freq: Four times a day (QID) | ORAL | 0 refills | Status: DC | PRN

## 2016-04-07 NOTE — Progress Notes (Signed)
Subjective:    Patient ID: Hannah Scott, female    DOB: [birth year], 65 y.o.   MRN: [id number]  HPI: [name] is a 65 year old female who returns for follow up for chronic pain and medication refill. She states her pain is located in her lower back mainly right side.She rates her pain 6. Her current exercise regime is walking for short distances using her cadillac walker or cane in her home and using her stationary bicycle weekly for 5-10 minutes..  Pain Inventory Average Pain 7 Pain Right Now 6 My pain is constant, burning, tingling and aching  In the last 24 hours, has pain interfered with the following? General activity 10 Relation with others 10 Enjoyment of life 10 What TIME of day is your pain at its worst? all Sleep (in general) Poor  Pain is worse with: walking and standing Pain improves with: rest and medication Relief from Meds: 6  Mobility use a cane use a walker ability to climb steps?  yes do you drive?  yes  Function disabled: date disabled .  Neuro/Psych weakness numbness tingling trouble walking depression  Prior Studies Any changes since last visit?  no  Physicians involved in your care Any changes since last visit?  no   Family History  Problem Relation Age of Onset  . Heart failure Mother   . Pneumonia Mother   . Hypertension Mother   . Heart disease Mother   . Stroke Maternal Grandfather   . Hypertension Maternal Grandfather   . Heart attack Father   . Heart disease Father   . Asthma Paternal Uncle     PAT UNCLES  . Heart attack Paternal Uncle    Social History   Social History  . Marital status: Married    Spouse name: N/A  . Number of children: N/A  . Years of education: N/A   Social History Main Topics  . Smoking status: Never Smoker  . Smokeless tobacco: Never Used  . Alcohol use No  . Drug use: No  . Sexual activity: No   Other Topics Concern  . None   Social History Narrative  . None   Past Surgical  History:  Procedure Laterality Date  . ANKLE SURGERY Left    ligament  . APPENDECTOMY  1987   AT [facility]  . BACK SURGERY     Fusion  . BREAST LUMPECTOMY  2003   radiation on right  . BREAST LUMPECTOMY WITH RADIOACTIVE SEED LOCALIZATION Left 02/12/2016   Procedure: LEFT BREAST LUMPECTOMY WITH RADIOACTIVE SEED LOCALIZATION;  Surgeon: Excell Seltzer, MD;  Location: [place];  Service: General;  Laterality: Left;  . CARDIOVASCULAR STRESS TEST  09/07/05   Nuclear, was negative  . CATARACT EXTRACTION Bilateral 01,03  . OOPHORECTOMY     LSO -RSO  . PELVIC LAPAROSCOPY  1989   RSO, LYSIS OF ADHESIONS  . TOTAL ABDOMINAL HYSTERECTOMY  1987   LSO, APPENDECTOMY  . TOTAL HIP ARTHROPLASTY Right 10/28/2013   dr Lorin Mercy  . TOTAL HIP ARTHROPLASTY Right 10/28/2013   Procedure: TOTAL HIP ARTHROPLASTY ANTERIOR APPROACH;  Surgeon: Marybelle Killings, MD;  Location: [place];  Service: Orthopedics;  Laterality: Right;  Right Total Hip Arthroplasty, Direct Anterior Approach   Past Medical History:  Diagnosis Date  . Anemia    hx  . Arthritis   . Asthma    PFTs, February, 2011, moderate obstructive disease with response to bronchodilators, normal lung volumes, moderate reduction in diffusing  capacity  . Atrial septal aneurysm    Echo, 2008-not noted on 13 echo  . CAD (coronary artery disease)    90% distal LAD in the past  /   nuclear, 2008, no ischemia, ejection fraction 70%  . Cancer ([place]) 2002   DUCTAL CIS--S/P LUMPECTOMY, RADIATION AND 6 WEEKS OF TAMOXIFEN  . D-dimer, elevated    January, 2014  . Depression   . Ejection fraction    EF 60%, echo, October, 2008  . Elevated CPK    January, 2014  . Endometriosis 1989   RIGHT TUBE  . Endometriosis 1987   LEFT TUBE/OVARY W FOCAL IN-SITU ENDOMETRIAL ADENOCARCINOMA  . GERD (gastroesophageal reflux disease)    occ  . H/O hiatal hernia    ?  [name] Hyperlipidemia   . Hypertension   . Hypothyroidism    Patient has had in the past that she does  not need treatment  . Kyphoscoliosis   . Obstructive airway disease ([employer])   . Pinched nerve    lower back  . Shortness of breath   . UTI (lower urinary tract infection)    BP 137/79 (BP Location: Left Arm, Patient Position: Sitting, Cuff Size: Large)   Pulse 76   Resp 14   SpO2 94%   Opioid Risk Score:   Fall Risk Score:  `1  Depression screen PHQ 2/9  Depression screen [facility] 2/9 01/06/2016 07/11/2014  Decreased Interest 0 3  Down, Depressed, Hopeless 1 2  PHQ - 2 Score 1 5  Altered sleeping - 3  Tired, decreased energy - 3  Change in appetite - 0  Feeling bad or failure about yourself  - 0  Trouble concentrating - 0  Moving slowly or fidgety/restless - 0  Suicidal thoughts - 0  PHQ-9 Score - 11  Some recent data might be hidden    Review of Systems  HENT: Negative.   Eyes: Negative.   Respiratory: Negative.   Cardiovascular: Negative.   Gastrointestinal: Negative.   Endocrine: Negative.   Genitourinary: Negative.   Musculoskeletal: Positive for back pain and gait problem.  Skin: Negative.   Allergic/Immunologic: Negative.   Neurological: Positive for weakness and numbness.       Tingling  Psychiatric/Behavioral: Positive for dysphoric mood.       Objective:   Physical Exam  Constitutional: She is oriented to person, place, and time. She appears well-developed and well-nourished.  HENT:  Head: Normocephalic and atraumatic.  Neck: Normal range of motion. Neck supple.  Cardiovascular: Normal rate and regular rhythm.   Pulmonary/Chest: Effort normal and breath sounds normal.  Musculoskeletal:  Normal Muscle Bulk and Muscle Testing Reveals:  Upper Extremities: Full ROM and Muscle Strength 5/5 Lumbar Paraspinal Tenderness: L-4-L-5 Lumbar Scoliosis Lower Extremities: Full ROM and Muscle Strength 5/5 Arises from Table Slowly Narrow Based Gait  Neurological: She is alert and oriented to person, place, and time.  Skin: Skin is warm and dry.  Psychiatric: She has  a normal mood and affect.  Nursing note and vitals reviewed.         Assessment & Plan:  1 Lumbar post laminectomy syndrome, s/p lumbar fusion:  Refilled: Hydrocodone 10/325mg  one tablet every 6 hours #100. We will continue the opioid monitoring program, this consists of regular clinic visits, examinations, urine drug screen, pill counts as well as use of [place] Controlled Substance Reporting system. 2. Right Hip OA: S/P Right Hip Replacement 10/28/2013. 3.Depression: Has Family Support and Friends. Counseling with Doristine Bosworth. 4. Right Greater  Trochanteric Tenderness: No Complaints Today. Continue with Ice and Heat Therapy.   20 minutes of face to face patient care time was spent during this visit. All questions were encouraged and answered.  F/U in 1 month

## 2016-04-13 LAB — TOXASSURE SELECT,+ANTIDEPR,UR

## 2016-04-21 NOTE — Progress Notes (Signed)
Urine drug screen for this encounter is consistent for prescribed medication 

## 2016-05-17 ENCOUNTER — Encounter: Payer: Medicare Other | Attending: Physical Medicine & Rehabilitation | Admitting: Registered Nurse

## 2016-05-17 ENCOUNTER — Encounter: Payer: Self-pay | Admitting: Registered Nurse

## 2016-05-17 VITALS — BP 111/59 | HR 85 | Resp 14

## 2016-05-17 DIAGNOSIS — M48062 Spinal stenosis, lumbar region with neurogenic claudication: Secondary | ICD-10-CM | POA: Diagnosis not present

## 2016-05-17 DIAGNOSIS — M48061 Spinal stenosis, lumbar region without neurogenic claudication: Secondary | ICD-10-CM | POA: Insufficient documentation

## 2016-05-17 DIAGNOSIS — M419 Scoliosis, unspecified: Secondary | ICD-10-CM

## 2016-05-17 DIAGNOSIS — M1611 Unilateral primary osteoarthritis, right hip: Secondary | ICD-10-CM | POA: Insufficient documentation

## 2016-05-17 DIAGNOSIS — M961 Postlaminectomy syndrome, not elsewhere classified: Secondary | ICD-10-CM | POA: Diagnosis not present

## 2016-05-17 DIAGNOSIS — Z79899 Other long term (current) drug therapy: Secondary | ICD-10-CM

## 2016-05-17 DIAGNOSIS — M24551 Contracture, right hip: Secondary | ICD-10-CM | POA: Diagnosis not present

## 2016-05-17 DIAGNOSIS — G894 Chronic pain syndrome: Secondary | ICD-10-CM

## 2016-05-17 DIAGNOSIS — M4126 Other idiopathic scoliosis, lumbar region: Secondary | ICD-10-CM | POA: Insufficient documentation

## 2016-05-17 DIAGNOSIS — Z5181 Encounter for therapeutic drug level monitoring: Secondary | ICD-10-CM

## 2016-05-17 MED ORDER — HYDROCODONE-ACETAMINOPHEN 10-325 MG PO TABS: 1.0000 | ORAL_TABLET | Freq: Four times a day (QID) | ORAL | 0 refills | Status: DC | PRN

## 2016-05-17 NOTE — Progress Notes (Signed)
Subjective:    Patient ID: Hannah Scott, female    DOB: 1950/09/13, 66 y.o.   MRN: [id number]  HPI: [name] is a 66 year old female who returns for follow up appointment for chronic pain and medication refill. She states her pain is located in her lower back mainly right side.She rates her pain 7. Her current exercise regime is walking for short distances using her cadillac walker or cane in her home and using her stationary bicycle weekly for 5-10 minutes.  Pain Inventory Average Pain 7 Pain Right Now 7 My pain is constant, burning, tingling and aching  In the last 24 hours, has pain interfered with the following? General activity 10 Relation with others 10 Enjoyment of life 10 What TIME of day is your pain at its worst? All Sleep (in general) Poor  Pain is worse with: walking and standing Pain improves with: rest and medication Relief from Meds: 5  Mobility use a cane use a walker ability to climb steps?  yes do you drive?  yes  Function disabled: date disabled n/a I need assistance with the following:  household duties and shopping  Neuro/Psych weakness numbness tingling trouble walking depression  Prior Studies Any changes since last visit?  no  Physicians involved in your care Any changes since last visit?  no   Family History  Problem Relation Age of Onset  . Heart failure Mother   . Pneumonia Mother   . Hypertension Mother   . Heart disease Mother   . Stroke Maternal Grandfather   . Hypertension Maternal Grandfather   . Heart attack Father   . Heart disease Father   . Asthma Paternal Uncle     PAT UNCLES  . Heart attack Paternal Uncle    Social History   Social History  . Marital status: Married    Spouse name: N/A  . Number of children: N/A  . Years of education: N/A   Social History Main Topics  . Smoking status: Never Smoker  . Smokeless tobacco: Never Used  . Alcohol use No  . Drug use: No  . Sexual activity: No   Other  Topics Concern  . None   Social History Narrative  . None   Past Surgical History:  Procedure Laterality Date  . ANKLE SURGERY Left    ligament  . APPENDECTOMY  1987   AT [facility]  . BACK SURGERY     Fusion  . BREAST LUMPECTOMY  2003   radiation on right  . BREAST LUMPECTOMY WITH RADIOACTIVE SEED LOCALIZATION Left 02/12/2016   Procedure: LEFT BREAST LUMPECTOMY WITH RADIOACTIVE SEED LOCALIZATION;  Surgeon: Excell Seltzer, MD;  Location: [place];  Service: General;  Laterality: Left;  . CARDIOVASCULAR STRESS TEST  09/07/05   Nuclear, was negative  . CATARACT EXTRACTION Bilateral 01,03  . OOPHORECTOMY     LSO -RSO  . PELVIC LAPAROSCOPY  1989   RSO, LYSIS OF ADHESIONS  . TOTAL ABDOMINAL HYSTERECTOMY  1987   LSO, APPENDECTOMY  . TOTAL HIP ARTHROPLASTY Right 10/28/2013   dr Lorin Mercy  . TOTAL HIP ARTHROPLASTY Right 10/28/2013   Procedure: TOTAL HIP ARTHROPLASTY ANTERIOR APPROACH;  Surgeon: Marybelle Killings, MD;  Location: [place];  Service: Orthopedics;  Laterality: Right;  Right Total Hip Arthroplasty, Direct Anterior Approach   Past Medical History:  Diagnosis Date  . Anemia    hx  . Arthritis   . Asthma    PFTs, February, 2011, moderate obstructive  disease with response to bronchodilators, normal lung volumes, moderate reduction in diffusing capacity  . Atrial septal aneurysm    Echo, 2008-not noted on 13 echo  . CAD (coronary artery disease)    90% distal LAD in the past  /   nuclear, 2008, no ischemia, ejection fraction 70%  . Cancer ([place]) 2002   DUCTAL CIS--S/P LUMPECTOMY, RADIATION AND 6 WEEKS OF TAMOXIFEN  . D-dimer, elevated    January, 2014  . Depression   . Ejection fraction    EF 60%, echo, October, 2008  . Elevated CPK    January, 2014  . Endometriosis 1989   RIGHT TUBE  . Endometriosis 1987   LEFT TUBE/OVARY W FOCAL IN-SITU ENDOMETRIAL ADENOCARCINOMA  . GERD (gastroesophageal reflux disease)    occ  . H/O hiatal hernia    ?  [name] Hyperlipidemia   .  Hypertension   . Hypothyroidism    Patient has had in the past that she does not need treatment  . Kyphoscoliosis   . Obstructive airway disease ([place])   . Pinched nerve    lower back  . Shortness of breath   . UTI (lower urinary tract infection)    BP (!) 111/59   Pulse 85   Resp 14   SpO2 94%   Opioid Risk Score:   Fall Risk Score:  `1  Depression screen PHQ 2/9  Depression screen [facility] 2/9 01/06/2016 07/11/2014  Decreased Interest 0 3  Down, Depressed, Hopeless 1 2  PHQ - 2 Score 1 5  Altered sleeping - 3  Tired, decreased energy - 3  Change in appetite - 0  Feeling bad or failure about yourself  - 0  Trouble concentrating - 0  Moving slowly or fidgety/restless - 0  Suicidal thoughts - 0  PHQ-9 Score - 11  Some recent data might be hidden    Review of Systems  Constitutional: Negative.   HENT: Negative.   Eyes: Negative.   Respiratory: Negative.   Cardiovascular: Negative.   Gastrointestinal: Negative.   Endocrine: Negative.   Genitourinary: Negative.   Musculoskeletal: Negative.   Skin: Negative.   Allergic/Immunologic: Negative.   Neurological: Negative.   Hematological: Negative.   Psychiatric/Behavioral: Negative.   All other systems reviewed and are negative.      Objective:   Physical Exam  Constitutional: She is oriented to person, place, and time. She appears well-developed and well-nourished.  HENT:  Head: Normocephalic and atraumatic.  Neck: Normal range of motion. Neck supple.  Cardiovascular: Normal rate and regular rhythm.   Pulmonary/Chest: Effort normal and breath sounds normal.  Musculoskeletal:  Normal Muscle Bulk and Muscle testing Reveals: Upper Extremities: Full ROM and Muscle Strength 5/5 Lumbar Scoliosis Lumbar Paraspinal Tenderness: L-3-L-5 Lower Extremities: Full ROM and Muscle Strength 5/5 Arises from Table Slolwly using walker for support Antalgic gait  Neurological: She is alert and oriented to person, place, and time.    Skin: Skin is warm and dry.  Psychiatric: She has a normal mood and affect.  Nursing note and vitals reviewed.         Assessment & Plan:  1 Lumbar post laminectomy syndrome, s/p lumbar fusion:  Refilled: Hydrocodone 10/325mg  one tablet every 6 hours #100. We will continue the opioid monitoring program, this consists of regular clinic visits, examinations, urine drug screen, pill counts as well as use of [place] Controlled Substance Reporting system. 2. Right Hip OA: S/P Right Hip Replacement 10/28/2013. 3.Depression: Has Family Support and Friends. Counseling with  [place]. 4. Right Greater Trochanteric Tenderness: No Complaints Today. Continue with Ice and Heat Therapy.   20 minutes of face to face patient care time was spent during this visit. All questions were encouraged and answered.  F/U in 1 month

## 2016-06-16 ENCOUNTER — Encounter: Payer: Self-pay | Admitting: Family

## 2016-06-16 ENCOUNTER — Ambulatory Visit (INDEPENDENT_AMBULATORY_CARE_PROVIDER_SITE_OTHER): Payer: Medicare Other | Admitting: Family

## 2016-06-16 VITALS — BP 136/82 | HR 96 | Temp 98.8°F | Resp 18 | Ht 60.0 in | Wt 157.8 lb

## 2016-06-16 DIAGNOSIS — J4541 Moderate persistent asthma with (acute) exacerbation: Secondary | ICD-10-CM

## 2016-06-16 MED ORDER — PREDNISONE 20 MG PO TABS: ORAL_TABLET | ORAL | 0 refills | Status: DC

## 2016-06-16 MED ORDER — AZITHROMYCIN 250 MG PO TABS: ORAL_TABLET | ORAL | 0 refills | Status: DC

## 2016-06-16 MED ORDER — METHYLPREDNISOLONE ACETATE 80 MG/ML IJ SUSP
80.0000 mg | Freq: Once | INTRAMUSCULAR | Status: AC
  Administered 2016-06-16: 80 mg via INTRAMUSCULAR

## 2016-06-16 NOTE — Assessment & Plan Note (Signed)
Symptoms and exam consistent with acute asthma exacerbation with concern for developing bronchitis. In office injection of 80 mg of Depo-Medrol provided. Start prednisone taper. Sample of [place]. Written prescription for azithromycin provided if symptoms worsen or do not improve after starting prednisone. Follow-up if symptoms worsen or do not improve.

## 2016-06-16 NOTE — Addendum Note (Signed)
Addended by: Mauricio Po D on: 06/16/2016 09:22 PM   Modules accepted: Level of Service

## 2016-06-16 NOTE — Progress Notes (Signed)
Subjective:    Patient ID: Hannah Scott, female    DOB: 11-15-1950, 66 y.o.   MRN: [id number]  Chief Complaint  Patient presents with  . Asthma    states she is having an asthma flare up, SOB and wheezing    HPI:  Hannah Scott is a 66 y.o. female who  has a past medical history of Anemia; Arthritis; Asthma; Atrial septal aneurysm; CAD (coronary artery disease); Cancer ([place]) (2002); D-dimer, elevated; Depression; Ejection fraction; Elevated CPK; Endometriosis (1989); Endometriosis (1987); GERD (gastroesophageal reflux disease); H/O hiatal hernia; Hyperlipidemia; Hypertension; Hypothyroidism; Kyphoscoliosis; Obstructive airway disease ([place]); Pinched nerve; Shortness of breath; and UTI (lower urinary tract infection). and presents today to for an acute office visit.  Associated symptoms of shortness of breath and wheezing have been going on for about 1 week. Denies fevers. Endorses night time awakenings with coughing and wheezing. Reports taking the albuterol as prescribed which helps a little with her symptoms. Unable to afford Breo and has been without it. No recent antibiotics. Most recent breathing treatment about 2 hours prior to arrival.   Allergies  Allergen Reactions  . Fluticasone-Salmeterol Other (See Comments)    REACTION: headache.  She is tolerating Dulera well.  [name] Norvasc [Amlodipine Besylate] Swelling and Other (See Comments)    edema  . Tape Other (See Comments)    Prefers paper tape      Outpatient Medications Prior to Visit  Medication Sig Dispense Refill  . albuterol (PROVENTIL HFA;VENTOLIN HFA) 108 (90 Base) MCG/ACT inhaler Inhale 1-2 puffs into the lungs every 6 (six) hours as needed for wheezing or shortness of breath.    [name] atorvastatin (LIPITOR) 20 MG tablet Take 1 tablet (20 mg total) by mouth daily. 90 tablet 3  . benazepril (LOTENSIN) 40 MG tablet Take 0.5 tablets (20 mg total) by mouth daily. 90 tablet 3  . DULoxetine (CYMBALTA) 30 MG capsule TAKE ONE CAPSULE  EVERY DAY 30 capsule 2  . gabapentin (NEURONTIN) 400 MG capsule Take 1 capsule (400 mg total) by mouth at bedtime. 30 capsule 5  . HYDROcodone-acetaminophen (NORCO) 10-325 MG tablet Take 1 tablet by mouth every 6 (six) hours as needed. 100 tablet 0  . ipratropium-albuterol (DUONEB) 0.5-2.5 (3) MG/3ML SOLN Take 3 mLs by nebulization as directed.     [name] levothyroxine (SYNTHROID, LEVOTHROID) 75 MCG tablet TAKE ONE TABLET BY MOUTH DAILY BEFORE BREAKFAST 30 tablet 5  . metoprolol tartrate (LOPRESSOR) 25 MG tablet Take 25 mg by mouth 2 (two) times daily.    [name] BREO ELLIPTA 100-25 MCG/INH AEPB INHALE 1 PUFF BY MOUTH DAILY 60 each 5   No facility-administered medications prior to visit.      Past Medical History:  Diagnosis Date  . Anemia    hx  . Arthritis   . Asthma    PFTs, February, 2011, moderate obstructive disease with response to bronchodilators, normal lung volumes, moderate reduction in diffusing capacity  . Atrial septal aneurysm    Echo, 2008-not noted on 13 echo  . CAD (coronary artery disease)    90% distal LAD in the past  /   nuclear, 2008, no ischemia, ejection fraction 70%  . Cancer ([place]) 2002   DUCTAL CIS--S/P LUMPECTOMY, RADIATION AND 6 WEEKS OF TAMOXIFEN  . D-dimer, elevated    January, 2014  . Depression   . Ejection fraction    EF 60%, echo, October, 2008  . Elevated CPK    January, 2014  . Endometriosis 1989  RIGHT TUBE  . Endometriosis 1987   LEFT TUBE/OVARY W FOCAL IN-SITU ENDOMETRIAL ADENOCARCINOMA  . GERD (gastroesophageal reflux disease)    occ  . H/O hiatal hernia    ?  [name] Hyperlipidemia   . Hypertension   . Hypothyroidism    Patient has had in the past that she does not need treatment  . Kyphoscoliosis   . Obstructive airway disease ([place])   . Pinched nerve    lower back  . Shortness of breath   . UTI (lower urinary tract infection)       Review of Systems  Constitutional: Negative for chills and fever.  HENT: Positive for congestion.  Negative for ear pain, sinus pain, sinus pressure and sore throat.   Respiratory: Positive for cough, chest tightness, shortness of breath and wheezing.   Cardiovascular: Negative for chest pain, palpitations and leg swelling.      Objective:    BP 136/82 (BP Location: Left Arm, Patient Position: Sitting, Cuff Size: Normal)   Pulse 96   Temp 98.8 F (37.1 C) (Oral)   Resp 18   Ht 5' (1.524 m)   Wt 157 lb 12.8 oz (71.6 kg)   SpO2 90%   BMI 30.82 kg/m  Nursing note and vital signs reviewed.  BP Readings from Last 3 Encounters:  06/16/16 136/82  05/17/16 (!) 111/59  04/07/16 137/79   Physical Exam  Constitutional: She is oriented to person, place, and time. She appears well-developed and well-nourished. No distress.  HENT:  Right Ear: Hearing, tympanic membrane, external ear and ear canal normal.  Left Ear: Hearing, tympanic membrane, external ear and ear canal normal.  Nose: Nose normal.  Mouth/Throat: Uvula is midline, oropharynx is clear and moist and mucous membranes are normal.  Cardiovascular: Normal rate, regular rhythm, normal heart sounds and intact distal pulses.   Pulmonary/Chest: Breath sounds normal. No respiratory distress. She has no wheezes. She has no rales. She exhibits no tenderness.  Neurological: She is alert and oriented to person, place, and time.  Skin: Skin is warm and dry.  Psychiatric: She has a normal mood and affect. Her behavior is normal. Judgment and thought content normal.       Assessment & Plan:   Problem List Items Addressed This Visit      Respiratory   Asthma exacerbation - Primary    Symptoms and exam consistent with acute asthma exacerbation with concern for developing bronchitis. In office injection of 80 mg of Depo-Medrol provided. Start prednisone taper. Sample of [place]. Written prescription for azithromycin provided if symptoms worsen or do not improve after starting prednisone. Follow-up if symptoms worsen or do not improve.       Relevant Medications   predniSONE (DELTASONE) 20 MG tablet   azithromycin (ZITHROMAX) 250 MG tablet   methylPREDNISolone acetate (DEPO-MEDROL) injection 80 mg (Completed)       I have discontinued Ms. Deridder's BREO ELLIPTA. I am also having her start on predniSONE and azithromycin. Additionally, I am having her maintain her atorvastatin, albuterol, ipratropium-albuterol, benazepril, metoprolol tartrate, gabapentin, DULoxetine, levothyroxine, and HYDROcodone-acetaminophen. We administered methylPREDNISolone acetate.   Meds ordered this encounter  Medications  . predniSONE (DELTASONE) 20 MG tablet    Sig: Take 3 tablets by mouth for 3 days, 2 tablets by mouth for 3 days and 1 tablet by mouth for 3 days.    Dispense:  18 tablet    Refill:  0    Order Specific Question:   Supervising Provider    Answer:  McCulloch, Lee [id number]  . azithromycin (ZITHROMAX) 250 MG tablet    Sig: Take 2 tablets by mouth for 1 day and then 1 tablet daily for 4 days.    Dispense:  6 tablet    Refill:  0    Order Specific Question:   Supervising Provider    Answer:   Pricilla Holm A [id number]  . methylPREDNISolone acetate (DEPO-MEDROL) injection 80 mg     Follow-up: Return if symptoms worsen or fail to improve.  Mauricio Po, FNP

## 2016-06-16 NOTE — Patient Instructions (Addendum)
Thank you for choosing [facility].  SUMMARY AND INSTRUCTIONS:  Start the [facility] today.  Start the prednisone tomorrow.   Hold antibiotic unless symptoms are worsening or fever develops.  Check on prices of medications: Dulera, Symbicort   Medication:  Start the [facility] - 2 inhalations twice daily.   Your prescription(s) have been submitted to your pharmacy or been printed and provided for you. Please take as directed and contact our office if you believe you are having problem(s) with the medication(s) or have any questions.    Follow up:  If your symptoms worsen or fail to improve, please contact our office for further instruction, or in case of emergency go directly to the emergency room at the closest medical facility.     Bronchospasm, Adult A bronchospasm is when the tubes that carry air in and out of your lungs (airways) spasm or tighten. During a bronchospasm it is hard to breathe. This is because the airways get smaller. A bronchospasm can be triggered by:  Allergies. These may be to animals, pollen, food, or mold.  Infection. This is a common cause of bronchospasm.  Exercise.  Irritants. These include pollution, cigarette smoke, strong odors, aerosol sprays, and paint fumes.  Weather changes.  Stress.  Being emotional. Follow these instructions at home:  Always have a plan for getting help. Know when to call your doctor and local emergency services (911 in the [place] Know where you can get emergency care.  Only take medicines as told by your doctor.  If you were prescribed an inhaler or nebulizer machine, ask your doctor how to use it correctly. Always use a spacer with your inhaler if you were given one.  Stay calm during an attack. Try to relax and breathe more slowly.  Control your home environment:  Change your heating and air conditioning filter at least once a month.  Limit your use of fireplaces and wood stoves.  Do not  smoke. Do not   allow smoking in your home.  Avoid perfumes and fragrances.  Get rid of pests (such as roaches and mice) and their droppings.  Throw away plants if you see mold on them.  Keep your house clean and dust free.  Replace carpet with wood, tile, or vinyl flooring. Carpet can trap dander and dust.  Use allergy-proof pillows, mattress covers, and box spring covers.  Wash bed sheets and blankets every week in hot water. Dry them in a dryer.  Use blankets that are made of polyester or cotton.  Wash hands frequently. Contact a doctor if:  You have muscle aches.  You have chest pain.  The thick spit you spit or cough up (sputum) changes from clear or white to yellow, green, gray, or bloody.  The thick spit you spit or cough up gets thicker.  There are problems that may be related to the medicine you are given such as:  A rash.  Itching.  Swelling.  Trouble breathing. Get help right away if:  You feel you cannot breathe or catch your breath.  You cannot stop coughing.  Your treatment is not helping you breathe better.  You have very bad chest pain. This information is not intended to replace advice given to you by your health care provider. Make sure you discuss any questions you have with your health care provider. Document Released: 02/06/2009 Document Revised: 09/17/2015 Document Reviewed: 10/02/2012 Elsevier Interactive Patient Education  2017 [employer].

## 2016-06-22 ENCOUNTER — Encounter: Payer: Medicare Other | Admitting: Registered Nurse

## 2016-07-06 ENCOUNTER — Encounter: Payer: Self-pay | Admitting: Registered Nurse

## 2016-07-06 ENCOUNTER — Ambulatory Visit (INDEPENDENT_AMBULATORY_CARE_PROVIDER_SITE_OTHER): Payer: Medicare Other | Admitting: Internal Medicine

## 2016-07-06 ENCOUNTER — Encounter: Payer: Medicare Other | Attending: Physical Medicine & Rehabilitation | Admitting: Registered Nurse

## 2016-07-06 ENCOUNTER — Encounter: Payer: Self-pay | Admitting: Internal Medicine

## 2016-07-06 ENCOUNTER — Other Ambulatory Visit (INDEPENDENT_AMBULATORY_CARE_PROVIDER_SITE_OTHER): Payer: Medicare Other

## 2016-07-06 ENCOUNTER — Telehealth: Payer: Self-pay | Admitting: Registered Nurse

## 2016-07-06 VITALS — BP 160/100 | HR 94 | Temp 98.5°F | Resp 18 | Wt 157.0 lb

## 2016-07-06 VITALS — BP 143/70 | HR 77 | Resp 14

## 2016-07-06 DIAGNOSIS — Z79899 Other long term (current) drug therapy: Secondary | ICD-10-CM | POA: Diagnosis not present

## 2016-07-06 DIAGNOSIS — M1611 Unilateral primary osteoarthritis, right hip: Secondary | ICD-10-CM | POA: Insufficient documentation

## 2016-07-06 DIAGNOSIS — G894 Chronic pain syndrome: Secondary | ICD-10-CM | POA: Diagnosis not present

## 2016-07-06 DIAGNOSIS — E7800 Pure hypercholesterolemia, unspecified: Secondary | ICD-10-CM

## 2016-07-06 DIAGNOSIS — J4541 Moderate persistent asthma with (acute) exacerbation: Secondary | ICD-10-CM

## 2016-07-06 DIAGNOSIS — M961 Postlaminectomy syndrome, not elsewhere classified: Secondary | ICD-10-CM | POA: Insufficient documentation

## 2016-07-06 DIAGNOSIS — I10 Essential (primary) hypertension: Secondary | ICD-10-CM

## 2016-07-06 DIAGNOSIS — Z5181 Encounter for therapeutic drug level monitoring: Secondary | ICD-10-CM

## 2016-07-06 DIAGNOSIS — M4126 Other idiopathic scoliosis, lumbar region: Secondary | ICD-10-CM | POA: Insufficient documentation

## 2016-07-06 DIAGNOSIS — E039 Hypothyroidism, unspecified: Secondary | ICD-10-CM

## 2016-07-06 DIAGNOSIS — M48061 Spinal stenosis, lumbar region without neurogenic claudication: Secondary | ICD-10-CM | POA: Diagnosis not present

## 2016-07-06 DIAGNOSIS — F3289 Other specified depressive episodes: Secondary | ICD-10-CM

## 2016-07-06 DIAGNOSIS — Z23 Encounter for immunization: Secondary | ICD-10-CM

## 2016-07-06 DIAGNOSIS — M419 Scoliosis, unspecified: Secondary | ICD-10-CM | POA: Diagnosis not present

## 2016-07-06 DIAGNOSIS — M24551 Contracture, right hip: Secondary | ICD-10-CM | POA: Diagnosis not present

## 2016-07-06 DIAGNOSIS — R7303 Prediabetes: Secondary | ICD-10-CM

## 2016-07-06 LAB — CBC WITH DIFFERENTIAL/PLATELET
BASOS PCT: 0.5 % (ref 0.0–3.0)
Basophils Absolute: 0 10*3/uL (ref 0.0–0.1)
EOS ABS: 0.5 10*3/uL (ref 0.0–0.7)
Eosinophils Relative: 9.9 % — ABNORMAL HIGH (ref 0.0–5.0)
HCT: 36.6 % (ref 36.0–46.0)
HEMOGLOBIN: 11.4 g/dL — AB (ref 12.0–15.0)
LYMPHS PCT: 18.2 % (ref 12.0–46.0)
Lymphs Abs: 1 10*3/uL (ref 0.7–4.0)
MCHC: 31.2 g/dL (ref 30.0–36.0)
MCV: 78.8 fl (ref 78.0–100.0)
MONO ABS: 0.5 10*3/uL (ref 0.1–1.0)
MONOS PCT: 9.6 % (ref 3.0–12.0)
Neutro Abs: 3.4 10*3/uL (ref 1.4–7.7)
Neutrophils Relative %: 61.8 % (ref 43.0–77.0)
PLATELETS: 232 10*3/uL (ref 150.0–400.0)
RBC: 4.65 Mil/uL (ref 3.87–5.11)
RDW: 17.8 % — AB (ref 11.5–15.5)
WBC: 5.4 10*3/uL (ref 4.0–10.5)

## 2016-07-06 LAB — COMPREHENSIVE METABOLIC PANEL
ALBUMIN: 4 g/dL (ref 3.5–5.2)
ALT: 32 U/L (ref 0–35)
AST: 25 U/L (ref 0–37)
Alkaline Phosphatase: 85 U/L (ref 39–117)
BILIRUBIN TOTAL: 0.3 mg/dL (ref 0.2–1.2)
BUN: 16 mg/dL (ref 6–23)
CALCIUM: 9.5 mg/dL (ref 8.4–10.5)
CO2: 31 meq/L (ref 19–32)
CREATININE: 0.6 mg/dL (ref 0.40–1.20)
Chloride: 104 mEq/L (ref 96–112)
GFR: 106.01 mL/min (ref >60.00)
Glucose, Bld: 120 mg/dL — ABNORMAL HIGH (ref 70–99)
Potassium: 4.4 mEq/L (ref 3.5–5.1)
Sodium: 142 mEq/L (ref 135–145)
Total Protein: 6.1 g/dL (ref 6.0–8.3)

## 2016-07-06 LAB — TSH: TSH: 0.7 u[IU]/mL (ref 0.35–4.50)

## 2016-07-06 LAB — HEMOGLOBIN A1C: HEMOGLOBIN A1C: 6.2 % (ref 4.6–6.5)

## 2016-07-06 MED ORDER — HYDROCODONE-ACETAMINOPHEN 10-325 MG PO TABS: 1.0000 | ORAL_TABLET | Freq: Four times a day (QID) | ORAL | 0 refills | Status: DC | PRN

## 2016-07-06 MED ORDER — PREDNISONE 20 MG PO TABS
ORAL_TABLET | ORAL | 0 refills | Status: DC
Start: 2016-07-06 — End: 2016-10-01

## 2016-07-06 MED ORDER — METHYLPREDNISOLONE ACETATE 80 MG/ML IJ SUSP
80.0000 mg | Freq: Once | INTRAMUSCULAR | Status: AC
  Administered 2016-07-06: 80 mg via INTRAMUSCULAR

## 2016-07-06 NOTE — Progress Notes (Signed)
Subjective:    Patient ID: Hannah Scott, female    DOB: [birth year], 66 y.o.   MRN: [id number]  HPI The patient is here for follow up.  CAD, Hypertension: She is taking her medication daily. She is compliant with a low sodium diet.  She denies chest pain, edema,  and regular headaches. She does monitor her blood pressure at home.    Hypothyroidism:  She is following with endocrine.  She is taking her medication daily.  She states low energy and is concerned her thyroid may be off.  Hyperlipidemia: She is taking her medication daily. She is compliant with a low fat/cholesterol diet. She is not exercising regularly. She denies myalgias.   Depression: She is taking her medication daily as prescribed. She denies any side effects from the medication. She feels her depression is fairly controlled and she is happy with her current dose of medication.   Asthma:  Her asthma is acting up.  She is using the nebulizer 1-2 times a day and sometimes a night.  She is having some cough, wheezing, sputum with green color at times, SOB and chest tightness.  Last month she was here and had an asthma exacerbation and was given steroid injection and steroid taper.  She felt better, but once she got to the lowest dose of oral steroids she started to feel worse again.    Medications and allergies reviewed with patient and updated if appropriate.  Patient Active Problem List   Diagnosis Date Noted  . History of breast cancer 01/06/2016  . Intraductal papilloma of left breast 01/06/2016  . Essential hypertension 06/08/2015  . Coronary artery disease involving native coronary artery of native heart without angina pectoris 06/08/2015  . DOE (dyspnea on exertion) 06/08/2015  . Fatigue 05/20/2015  . Back pain 04/22/2015  . Lumbar scoliosis 01/10/2014  . Anemia due to blood loss 11/27/2013  . Elevated CPK   . Multinodular goiter 12/01/2011  . Asthma exacerbation   . Hypothyroidism   . CAD (coronary artery  disease)   . Atrial septal aneurysm   . Hyperlipidemia   . Scoliosis   . Spinal stenosis, lumbar region, with neurogenic claudication 07/01/2011  . Lumbar postlaminectomy syndrome 07/01/2011  . Osteoarthritis of right hip 07/01/2011  . Contracture of right hip 07/01/2011  . Endometriosis   . Depression 12/24/2009  . Migraine headache 05/19/2006    Current Outpatient Prescriptions on File Prior to Visit  Medication Sig Dispense Refill  . albuterol (PROVENTIL HFA;VENTOLIN HFA) 108 (90 Base) MCG/ACT inhaler Inhale 1-2 puffs into the lungs every 6 (six) hours as needed for wheezing or shortness of breath.    [name] atorvastatin (LIPITOR) 20 MG tablet Take 1 tablet (20 mg total) by mouth daily. 90 tablet 3  . benazepril (LOTENSIN) 40 MG tablet Take 0.5 tablets (20 mg total) by mouth daily. 90 tablet 3  . DULoxetine (CYMBALTA) 30 MG capsule TAKE ONE CAPSULE EVERY DAY 30 capsule 2  . gabapentin (NEURONTIN) 400 MG capsule Take 1 capsule (400 mg total) by mouth at bedtime. 30 capsule 5  . ipratropium-albuterol (DUONEB) 0.5-2.5 (3) MG/3ML SOLN Take 3 mLs by nebulization as directed.     [name] levothyroxine (SYNTHROID, LEVOTHROID) 75 MCG tablet TAKE ONE TABLET BY MOUTH DAILY BEFORE BREAKFAST 30 tablet 5  . metoprolol tartrate (LOPRESSOR) 25 MG tablet Take 25 mg by mouth 2 (two) times daily.     No current facility-administered medications on file prior to visit.  Past Medical History:  Diagnosis Date  . Anemia    hx  . Arthritis   . Asthma    PFTs, February, 2011, moderate obstructive disease with response to bronchodilators, normal lung volumes, moderate reduction in diffusing capacity  . Atrial septal aneurysm    Echo, 2008-not noted on 13 echo  . CAD (coronary artery disease)    90% distal LAD in the past  /   nuclear, 2008, no ischemia, ejection fraction 70%  . Cancer ([place]) 2002   DUCTAL CIS--S/P LUMPECTOMY, RADIATION AND 6 WEEKS OF TAMOXIFEN  . D-dimer, elevated    January, 2014  .  Depression   . Ejection fraction    EF 60%, echo, October, 2008  . Elevated CPK    January, 2014  . Endometriosis 1989   RIGHT TUBE  . Endometriosis 1987   LEFT TUBE/OVARY W FOCAL IN-SITU ENDOMETRIAL ADENOCARCINOMA  . GERD (gastroesophageal reflux disease)    occ  . H/O hiatal hernia    ?  [name] Hyperlipidemia   . Hypertension   . Hypothyroidism    Patient has had in the past that she does not need treatment  . Kyphoscoliosis   . Obstructive airway disease ([place])   . Pinched nerve    lower back  . Shortness of breath   . UTI (lower urinary tract infection)     Past Surgical History:  Procedure Laterality Date  . ANKLE SURGERY Left    ligament  . APPENDECTOMY  1987   AT [facility]  . BACK SURGERY     Fusion  . BREAST LUMPECTOMY  2003   radiation on right  . BREAST LUMPECTOMY WITH RADIOACTIVE SEED LOCALIZATION Left 02/12/2016   Procedure: LEFT BREAST LUMPECTOMY WITH RADIOACTIVE SEED LOCALIZATION;  Surgeon: Excell Seltzer, MD;  Location: [place];  Service: General;  Laterality: Left;  . CARDIOVASCULAR STRESS TEST  09/07/05   Nuclear, was negative  . CATARACT EXTRACTION Bilateral 01,03  . OOPHORECTOMY     LSO -RSO  . PELVIC LAPAROSCOPY  1989   RSO, LYSIS OF ADHESIONS  . TOTAL ABDOMINAL HYSTERECTOMY  1987   LSO, APPENDECTOMY  . TOTAL HIP ARTHROPLASTY Right 10/28/2013   dr Lorin Mercy  . TOTAL HIP ARTHROPLASTY Right 10/28/2013   Procedure: TOTAL HIP ARTHROPLASTY ANTERIOR APPROACH;  Surgeon: Marybelle Killings, MD;  Location: [place];  Service: Orthopedics;  Laterality: Right;  Right Total Hip Arthroplasty, Direct Anterior Approach    Social History   Social History  . Marital status: Married    Spouse name: N/A  . Number of children: N/A  . Years of education: N/A   Social History Main Topics  . Smoking status: Never Smoker  . Smokeless tobacco: Never Used  . Alcohol use No  . Drug use: No  . Sexual activity: No   Other Topics Concern  . Not on file   Social  History Narrative  . No narrative on file    Family History  Problem Relation Age of Onset  . Heart failure Mother   . Pneumonia Mother   . Hypertension Mother   . Heart disease Mother   . Stroke Maternal Grandfather   . Hypertension Maternal Grandfather   . Heart attack Father   . Heart disease Father   . Asthma Paternal Uncle     PAT UNCLES  . Heart attack Paternal Uncle     Review of Systems  Constitutional: Negative for chills and fever.  HENT: Positive for postnasal drip. Negative for  ear pain and sore throat.   Respiratory: Positive for cough, chest tightness, shortness of breath and wheezing.   Cardiovascular: Positive for palpitations (occ). Negative for chest pain and leg swelling.  Gastrointestinal: Negative for abdominal pain.       No gerd  Neurological: Negative for dizziness, light-headedness and headaches.       Objective:   Vitals:   07/06/16 1534  BP: (!) 160/100  Pulse: 94  Resp: 18  Temp: 98.5 F (36.9 C)   Wt Readings from Last 3 Encounters:  07/06/16 157 lb (71.2 kg)  06/16/16 157 lb 12.8 oz (71.6 kg)  02/12/16 163 lb (73.9 kg)   Body mass index is 30.66 kg/m.   Physical Exam    Constitutional: Appears well-developed and well-nourished. No respiratory distress, but appears ill.  HENT:  Head: Normocephalic and atraumatic.  Neck: Neck supple. No tracheal deviation present. No thyromegaly present.  No cervical lymphadenopathy Cardiovascular: Normal rate, regular rhythm and normal heart sounds.   No murmur heard. No carotid bruit .  trace edema Pulmonary/Chest: No respiratory distress. expiratory wheezes. No rales.  Skin: Skin is warm and dry. Not diaphoretic.  Psychiatric: Normal mood and affect. Behavior is normal.      Assessment & Plan:    See Problem List for Assessment and Plan of chronic medical problems.

## 2016-07-06 NOTE — Patient Instructions (Addendum)
Start over the counter zantac for heartburn.   Test(s) ordered today. Your results will be released to [place] (or called to you) after review, usually within 72hours after test completion. If any changes need to be made, you will be notified at that same time.  All other Health Maintenance issues reviewed.   All recommended immunizations and age-appropriate screenings are up-to-date or discussed.  Pneumovax immunization administered today.  Steroid injection today.  Medications reviewed and updated.  Changes include prednisone taper.  Your prescription(s) have been submitted to your pharmacy. Please take as directed and contact our office if you believe you are having problem(s) with the medication(s).    Please followup in 6 month, sooner if needed

## 2016-07-06 NOTE — Telephone Encounter (Signed)
On 07/06/2016 the [place] was reviewed no conflict was seen on the [place] with multiple prescribers. Hannah Scott has a signed narcotic contract with our office. If there were any discrepancies this would have been reported to her physician.

## 2016-07-06 NOTE — Assessment & Plan Note (Addendum)
Had treatment recently and did well short term - now feeling much worse again  Does not appear to have an infection at this time - will hold off on antibotics Depo-medrol 80 mg IM x 1 Prednisone taper - will give a longer taper Albuterol as needed Call if no improvement

## 2016-07-06 NOTE — Assessment & Plan Note (Addendum)
BP 140/70 earlier today at another doctor's office  -elevated here Will check at home. - she does monitor regularly and it is usually well controlled No change in meds today

## 2016-07-06 NOTE — Assessment & Plan Note (Signed)
Not fasting Continue statin 

## 2016-07-06 NOTE — Assessment & Plan Note (Signed)
Controlled, stable Continue current dose of medication  

## 2016-07-06 NOTE — Assessment & Plan Note (Addendum)
sees endocrine She feels the thyroid may be off - will check today

## 2016-07-06 NOTE — Progress Notes (Signed)
Pre visit review using our clinic review tool, if applicable. No additional management support is needed unless otherwise documented below in the visit note. 

## 2016-07-06 NOTE — Progress Notes (Signed)
Subjective:    Patient ID: Hannah Scott, female    DOB: Jan 24, 1951, 66 y.o.   MRN: [id number]  HPI: Hannah Scott is a 66 year old female who returns for follow up appointment for chronic pain and medication refill. She states her pain is located in her lower back mainly right side.She rates her pain 6. Her current exercise regime is walking for short distances using her cadillac walker or cane in her home and using her stationary bicycle weekly for 5-10 minutes.  Pain Inventory Average Pain 7 Pain Right Now 6 My pain is constant, tingling and aching  In the last 24 hours, has pain interfered with the following? General activity 10 Relation with others 10 Enjoyment of life 10 What TIME of day is your pain at its worst? all Sleep (in general) Poor  Pain is worse with: walking and standing Pain improves with: rest and medication Relief from Meds: 5  Mobility walk with assistance use a cane use a walker ability to climb steps?  yes do you drive?  yes  Function retired  Neuro/Psych weakness numbness tingling trouble walking  Prior Studies Any changes since last visit?  no  Physicians involved in your care Any changes since last visit?  no   Family History  Problem Relation Age of Onset  . Heart failure Mother   . Pneumonia Mother   . Hypertension Mother   . Heart disease Mother   . Stroke Maternal Grandfather   . Hypertension Maternal Grandfather   . Heart attack Father   . Heart disease Father   . Asthma Paternal Uncle     PAT UNCLES  . Heart attack Paternal Uncle    Social History   Social History  . Marital status: Married    Spouse name: N/A  . Number of children: N/A  . Years of education: N/A   Social History Main Topics  . Smoking status: Never Smoker  . Smokeless tobacco: Never Used  . Alcohol use No  . Drug use: No  . Sexual activity: No   Other Topics Concern  . None   Social History Narrative  . None   Past Surgical History:    Procedure Laterality Date  . ANKLE SURGERY Left    ligament  . APPENDECTOMY  1987   AT [facility]  . BACK SURGERY     Fusion  . BREAST LUMPECTOMY  2003   radiation on right  . BREAST LUMPECTOMY WITH RADIOACTIVE SEED LOCALIZATION Left 02/12/2016   Procedure: LEFT BREAST LUMPECTOMY WITH RADIOACTIVE SEED LOCALIZATION;  Surgeon: Excell Seltzer, MD;  Location: [facility];  Service: General;  Laterality: Left;  . CARDIOVASCULAR STRESS TEST  09/07/05   Nuclear, was negative  . CATARACT EXTRACTION Bilateral 01,03  . OOPHORECTOMY     LSO -RSO  . PELVIC LAPAROSCOPY  1989   RSO, LYSIS OF ADHESIONS  . TOTAL ABDOMINAL HYSTERECTOMY  1987   LSO, APPENDECTOMY  . TOTAL HIP ARTHROPLASTY Right 10/28/2013   dr Lorin Mercy  . TOTAL HIP ARTHROPLASTY Right 10/28/2013   Procedure: TOTAL HIP ARTHROPLASTY ANTERIOR APPROACH;  Surgeon: Marybelle Killings, MD;  Location: [facility];  Service: Orthopedics;  Laterality: Right;  Right Total Hip Arthroplasty, Direct Anterior Approach   Past Medical History:  Diagnosis Date  . Anemia    hx  . Arthritis   . Asthma    PFTs, February, 2011, moderate obstructive disease with response to bronchodilators, normal lung volumes, moderate reduction in diffusing  capacity  . Atrial septal aneurysm    Echo, 2008-not noted on 13 echo  . CAD (coronary artery disease)    90% distal LAD in the past  /   nuclear, 2008, no ischemia, ejection fraction 70%  . Cancer ([place]) 2002   DUCTAL CIS--S/P LUMPECTOMY, RADIATION AND 6 WEEKS OF TAMOXIFEN  . D-dimer, elevated    January, 2014  . Depression   . Ejection fraction    EF 60%, echo, October, 2008  . Elevated CPK    January, 2014  . Endometriosis 1989   RIGHT TUBE  . Endometriosis 1987   LEFT TUBE/OVARY W FOCAL IN-SITU ENDOMETRIAL ADENOCARCINOMA  . GERD (gastroesophageal reflux disease)    occ  . H/O hiatal hernia    ?  [name] Hyperlipidemia   . Hypertension   . Hypothyroidism    Patient has had in the past that she does not need  treatment  . Kyphoscoliosis   . Obstructive airway disease ([place])   . Pinched nerve    lower back  . Shortness of breath   . UTI (lower urinary tract infection)    BP (!) 143/70 (BP Location: Right Arm, Patient Position: Sitting, Cuff Size: Large)   Pulse 77   Resp 14   SpO2 91%   Opioid Risk Score:   Fall Risk Score:  `1  Depression screen PHQ 2/9  Depression screen [facility] 2/9 01/06/2016 07/11/2014  Decreased Interest 0 3  Down, Depressed, Hopeless 1 2  PHQ - 2 Score 1 5  Altered sleeping - 3  Tired, decreased energy - 3  Change in appetite - 0  Feeling bad or failure about yourself  - 0  Trouble concentrating - 0  Moving slowly or fidgety/restless - 0  Suicidal thoughts - 0  PHQ-9 Score - 11  Some recent data might be hidden    Review of Systems  HENT: Negative.   Eyes: Negative.   Respiratory: Negative.   Cardiovascular: Negative.   Gastrointestinal: Negative.   Endocrine: Negative.   Genitourinary: Negative.   Musculoskeletal: Positive for arthralgias, back pain and gait problem.  Skin: Negative.   Allergic/Immunologic: Negative.   Neurological: Positive for weakness and numbness.       Tingling  Hematological: Negative.   Psychiatric/Behavioral: Negative.   All other systems reviewed and are negative.      Objective:   Physical Exam  Constitutional: She is oriented to person, place, and time. She appears well-developed and well-nourished.  HENT:  Head: Normocephalic and atraumatic.  Neck: Normal range of motion. Neck supple.  Cardiovascular: Normal rate and regular rhythm.   Pulmonary/Chest: Effort normal and breath sounds normal.  Musculoskeletal:  Normal Muscle Bulk and Muscle Testing Reveals Upper Extremities: Full ROM and Muscle Strength 5/5 Back without spinal tenderness Lumbar Scoliosis Lower Extremities: Full ROM and Muscle Strength 5/5 Arises from Table slowly using walker for support Antalgic Gait  Neurological: She is alert and oriented to  person, place, and time.  Skin: Skin is warm and dry.  Psychiatric: She has a normal mood and affect.  Nursing note and vitals reviewed.         Assessment & Plan:  1 Lumbar post laminectomy syndrome, s/p lumbar fusion: 07/06/2016 Refilled: Hydrocodone 10/325mg  one tablet every 6 hours #100. We will continue the opioid monitoring program, this consists of regular clinic visits, examinations, urine drug screen, pill counts as well as use of [place] Controlled Substance Reporting system. 2. Right Hip OA: S/P Right Hip Replacement  10/28/2013. 07/06/2016 3.Depression: Has Family Support and Friends. Counseling with Doristine Bosworth. 07/06/2016 4. Right Greater Trochanteric Tenderness: No Complaints Today.  Continue with Ice and Heat Therapy. 07/06/2016  15 minutes of face to face patient care time was spent during this visit. All questions were encouraged and answered.   F/U in 1 month

## 2016-07-07 ENCOUNTER — Encounter: Payer: Self-pay | Admitting: Internal Medicine

## 2016-07-07 ENCOUNTER — Other Ambulatory Visit: Payer: Self-pay | Admitting: *Deleted

## 2016-07-07 ENCOUNTER — Other Ambulatory Visit: Payer: Self-pay | Admitting: Internal Medicine

## 2016-07-07 DIAGNOSIS — E119 Type 2 diabetes mellitus without complications: Secondary | ICD-10-CM | POA: Insufficient documentation

## 2016-07-07 DIAGNOSIS — R7303 Prediabetes: Secondary | ICD-10-CM | POA: Insufficient documentation

## 2016-07-07 MED ORDER — GABAPENTIN 400 MG PO CAPS: 400.0000 mg | ORAL_CAPSULE | Freq: Every day | ORAL | 5 refills | Status: DC

## 2016-07-25 ENCOUNTER — Other Ambulatory Visit: Payer: Self-pay | Admitting: Cardiology

## 2016-08-02 ENCOUNTER — Encounter: Payer: Self-pay | Admitting: Registered Nurse

## 2016-08-02 ENCOUNTER — Encounter: Payer: Medicare Other | Attending: Physical Medicine & Rehabilitation | Admitting: Registered Nurse

## 2016-08-02 VITALS — BP 136/82 | HR 80 | Resp 14

## 2016-08-02 DIAGNOSIS — M24551 Contracture, right hip: Secondary | ICD-10-CM | POA: Diagnosis not present

## 2016-08-02 DIAGNOSIS — M419 Scoliosis, unspecified: Secondary | ICD-10-CM | POA: Diagnosis not present

## 2016-08-02 DIAGNOSIS — M48061 Spinal stenosis, lumbar region without neurogenic claudication: Secondary | ICD-10-CM | POA: Insufficient documentation

## 2016-08-02 DIAGNOSIS — M1611 Unilateral primary osteoarthritis, right hip: Secondary | ICD-10-CM | POA: Insufficient documentation

## 2016-08-02 DIAGNOSIS — M4126 Other idiopathic scoliosis, lumbar region: Secondary | ICD-10-CM | POA: Diagnosis not present

## 2016-08-02 DIAGNOSIS — Z5181 Encounter for therapeutic drug level monitoring: Secondary | ICD-10-CM

## 2016-08-02 DIAGNOSIS — M961 Postlaminectomy syndrome, not elsewhere classified: Secondary | ICD-10-CM | POA: Diagnosis not present

## 2016-08-02 DIAGNOSIS — Z79899 Other long term (current) drug therapy: Secondary | ICD-10-CM

## 2016-08-02 DIAGNOSIS — G894 Chronic pain syndrome: Secondary | ICD-10-CM

## 2016-08-02 MED ORDER — HYDROCODONE-ACETAMINOPHEN 10-325 MG PO TABS: 1.0000 | ORAL_TABLET | Freq: Four times a day (QID) | ORAL | 0 refills | Status: DC | PRN

## 2016-08-02 NOTE — Progress Notes (Signed)
Subjective:    Patient ID: Hannah Scott, female    DOB: July 27, 1950, 66 y.o.   MRN: [id number]  HPI: [name] is a 66 year old female who returns for follow up appointmentfor chronic pain and medication refill. She states her pain is located in her lower back mainly right side.She rates her pain 7. Her current exercise regime is walking for short distances using her cadillac walker or cane in her home and using her stationary bicycle weekly for 5-10 minutes.  Her last UDS was on 04/07/16 it was consistent, she will be having a UDS today.    Pain Inventory Average Pain 7 Pain Right Now 7 My pain is constant, burning, tingling and aching  In the last 24 hours, has pain interfered with the following? General activity 10 Relation with others 10 Enjoyment of life 10 What TIME of day is your pain at its worst? all Sleep (in general) Poor  Pain is worse with: walking and standing Pain improves with: rest and medication Relief from Meds: 5  Mobility walk with assistance use a cane use a walker ability to climb steps?  yes do you drive?  yes  Function disabled: date disabled . I need assistance with the following:  household duties  Neuro/Psych weakness numbness tingling trouble walking depression  Prior Studies Any changes since last visit?  no  Physicians involved in your care Any changes since last visit?  no   Family History  Problem Relation Age of Onset  . Heart failure Mother   . Pneumonia Mother   . Hypertension Mother   . Heart disease Mother   . Stroke Maternal Grandfather   . Hypertension Maternal Grandfather   . Heart attack Father   . Heart disease Father   . Asthma Paternal Uncle     PAT UNCLES  . Heart attack Paternal Uncle    Social History   Social History  . Marital status: Married    Spouse name: N/A  . Number of children: N/A  . Years of education: N/A   Social History Main Topics  . Smoking status: Never Smoker  . Smokeless  tobacco: Never Used  . Alcohol use No  . Drug use: No  . Sexual activity: No   Other Topics Concern  . None   Social History Narrative  . None   Past Surgical History:  Procedure Laterality Date  . ANKLE SURGERY Left    ligament  . APPENDECTOMY  1987   AT [facility]  . BACK SURGERY     Fusion  . BREAST LUMPECTOMY  2003   radiation on right  . BREAST LUMPECTOMY WITH RADIOACTIVE SEED LOCALIZATION Left 02/12/2016   Procedure: LEFT BREAST LUMPECTOMY WITH RADIOACTIVE SEED LOCALIZATION;  Surgeon: Excell Seltzer, MD;  Location: [facility];  Service: General;  Laterality: Left;  . CARDIOVASCULAR STRESS TEST  09/07/05   Nuclear, was negative  . CATARACT EXTRACTION Bilateral 01,03  . OOPHORECTOMY     LSO -RSO  . PELVIC LAPAROSCOPY  1989   RSO, LYSIS OF ADHESIONS  . TOTAL ABDOMINAL HYSTERECTOMY  1987   LSO, APPENDECTOMY  . TOTAL HIP ARTHROPLASTY Right 10/28/2013   dr Lorin Mercy  . TOTAL HIP ARTHROPLASTY Right 10/28/2013   Procedure: TOTAL HIP ARTHROPLASTY ANTERIOR APPROACH;  Surgeon: Marybelle Killings, MD;  Location: [facility];  Service: Orthopedics;  Laterality: Right;  Right Total Hip Arthroplasty, Direct Anterior Approach   Past Medical History:  Diagnosis Date  . Anemia  hx  . Arthritis   . Asthma    PFTs, February, 2011, moderate obstructive disease with response to bronchodilators, normal lung volumes, moderate reduction in diffusing capacity  . Atrial septal aneurysm    Echo, 2008-not noted on 13 echo  . CAD (coronary artery disease)    90% distal LAD in the past  /   nuclear, 2008, no ischemia, ejection fraction 70%  . Cancer ([place]) 2002   DUCTAL CIS--S/P LUMPECTOMY, RADIATION AND 6 WEEKS OF TAMOXIFEN  . D-dimer, elevated    January, 2014  . Depression   . Ejection fraction    EF 60%, echo, October, 2008  . Elevated CPK    January, 2014  . Endometriosis 1989   RIGHT TUBE  . Endometriosis 1987   LEFT TUBE/OVARY W FOCAL IN-SITU ENDOMETRIAL ADENOCARCINOMA  . GERD  (gastroesophageal reflux disease)    occ  . H/O hiatal hernia    ?  [name] Hyperlipidemia   . Hypertension   . Hypothyroidism    Patient has had in the past that she does not need treatment  . Kyphoscoliosis   . Obstructive airway disease ([place])   . Pinched nerve    lower back  . Shortness of breath   . UTI (lower urinary tract infection)    BP 136/82 (BP Location: Left Arm, Patient Position: Sitting, Cuff Size: Normal)   Pulse 80   Resp 14   SpO2 93%   Opioid Risk Score:   Fall Risk Score:  `1  Depression screen PHQ 2/9  Depression screen [facility] 2/9 01/06/2016 07/11/2014  Decreased Interest 0 3  Down, Depressed, Hopeless 1 2  PHQ - 2 Score 1 5  Altered sleeping - 3  Tired, decreased energy - 3  Change in appetite - 0  Feeling bad or failure about yourself  - 0  Trouble concentrating - 0  Moving slowly or fidgety/restless - 0  Suicidal thoughts - 0  PHQ-9 Score - 11  Some recent data might be hidden    Review of Systems  HENT: Negative.   Eyes: Negative.   Respiratory: Negative.   Cardiovascular: Negative.   Gastrointestinal: Negative.   Endocrine: Negative.   Genitourinary: Negative.   Musculoskeletal: Positive for back pain.  Skin: Negative.   Allergic/Immunologic: Negative.   Neurological: Positive for weakness and numbness.       Tingling  Hematological: Negative.   Psychiatric/Behavioral: Positive for dysphoric mood.  All other systems reviewed and are negative.      Objective:   Physical Exam  Constitutional: She is oriented to person, place, and time. She appears well-developed and well-nourished.  HENT:  Head: Normocephalic and atraumatic.  Neck: Normal range of motion. Neck supple.  Cardiovascular: Normal rate and regular rhythm.   Pulmonary/Chest: Effort normal and breath sounds normal.  Musculoskeletal:  Normal Muscle Bulk and Muscle Testing Reveals: Upper Extremities: Full ROM and Muscle Strength 5/5 Lumbar Paraspinal Tenderness: L-3-L-5 Lower  Extremities: Full ROM and Muscle Strength 5/5 Arises from Table slowly using walker for support Narrow Based Gait   Neurological: She is alert and oriented to person, place, and time.  Skin: Skin is warm and dry.  Psychiatric: She has a normal mood and affect.  Nursing note and vitals reviewed.         Assessment & Plan:  1 Lumbar post laminectomy syndrome, s/p lumbar fusion: 08/02/2016 Refilled: Hydrocodone 10/325mg  one tablet every 6 hours #100. We will continue the opioid monitoring program, this consists of regular clinic visits,  examinations, urine drug screen, pill counts as well as use of [place] Controlled Substance Reporting system. 2. Right Hip OA: S/P Right Hip Replacement 10/28/2013. 08/02/2016 3.Depression: Has Family Support and Friends. Counseling with Doristine Bosworth. 08/02/2016 4. Right Greater Trochanteric Tenderness: No Complaints Today.  Continue with Ice and Heat Therapy. 08/02/2016  15 minutes of face to face patient care time was spent during this visit. All questions were encouraged and answered.   F/U in 1 month

## 2016-08-05 LAB — TOXASSURE SELECT,+ANTIDEPR,UR

## 2016-08-10 ENCOUNTER — Telehealth: Payer: Self-pay | Admitting: Physical Medicine & Rehabilitation

## 2016-08-10 NOTE — Telephone Encounter (Signed)
DOS 03/14/16 Jessicaann wrote a Check [phone], this check was not collected correctly and the money was not withdrawn from Patients account, please advise patient we need to collect $50.00.

## 2016-08-11 ENCOUNTER — Telehealth: Payer: Self-pay | Admitting: *Deleted

## 2016-08-11 NOTE — Telephone Encounter (Signed)
Urine drug screen for this encounter is consistent for prescribed medication 

## 2016-08-23 ENCOUNTER — Other Ambulatory Visit: Payer: Self-pay | Admitting: Internal Medicine

## 2016-08-30 ENCOUNTER — Telehealth: Payer: Self-pay | Admitting: Registered Nurse

## 2016-08-30 MED ORDER — HYDROCODONE-ACETAMINOPHEN 10-325 MG PO TABS: 1.0000 | ORAL_TABLET | Freq: Four times a day (QID) | ORAL | 0 refills | Status: DC | PRN

## 2016-08-30 NOTE — Telephone Encounter (Signed)
According to [facility] was reviewed, Hydrocodone prescription was picked up on 08/10/2016. Prescription Printed.

## 2016-08-31 ENCOUNTER — Encounter: Payer: Medicare Other | Admitting: Registered Nurse

## 2016-09-01 ENCOUNTER — Other Ambulatory Visit: Payer: Self-pay | Admitting: Endocrinology

## 2016-09-07 ENCOUNTER — Encounter: Payer: Self-pay | Admitting: Gynecology

## 2016-09-08 ENCOUNTER — Encounter: Payer: Medicare Other | Attending: Physical Medicine & Rehabilitation | Admitting: Registered Nurse

## 2016-09-08 ENCOUNTER — Encounter: Payer: Self-pay | Admitting: Registered Nurse

## 2016-09-08 ENCOUNTER — Telehealth: Payer: Self-pay | Admitting: Registered Nurse

## 2016-09-08 VITALS — BP 153/75 | HR 70

## 2016-09-08 DIAGNOSIS — M1611 Unilateral primary osteoarthritis, right hip: Secondary | ICD-10-CM | POA: Insufficient documentation

## 2016-09-08 DIAGNOSIS — Z5181 Encounter for therapeutic drug level monitoring: Secondary | ICD-10-CM | POA: Diagnosis not present

## 2016-09-08 DIAGNOSIS — M4126 Other idiopathic scoliosis, lumbar region: Secondary | ICD-10-CM | POA: Insufficient documentation

## 2016-09-08 DIAGNOSIS — M961 Postlaminectomy syndrome, not elsewhere classified: Secondary | ICD-10-CM | POA: Insufficient documentation

## 2016-09-08 DIAGNOSIS — M24551 Contracture, right hip: Secondary | ICD-10-CM

## 2016-09-08 DIAGNOSIS — M419 Scoliosis, unspecified: Secondary | ICD-10-CM

## 2016-09-08 DIAGNOSIS — M48061 Spinal stenosis, lumbar region without neurogenic claudication: Secondary | ICD-10-CM | POA: Insufficient documentation

## 2016-09-08 DIAGNOSIS — Z79899 Other long term (current) drug therapy: Secondary | ICD-10-CM

## 2016-09-08 DIAGNOSIS — G894 Chronic pain syndrome: Secondary | ICD-10-CM

## 2016-09-08 MED ORDER — HYDROCODONE-ACETAMINOPHEN 10-325 MG PO TABS: 1.0000 | ORAL_TABLET | Freq: Four times a day (QID) | ORAL | 0 refills | Status: DC | PRN

## 2016-09-08 NOTE — Progress Notes (Signed)
Subjective:    Patient ID: Hannah Scott, female    DOB: Oct 14, 1950, 66 y.o.   MRN: [id number]  HPI: [name] is a 66 year old female who returns for follow up appointmentfor chronic pain and medication refill. She states her pain is located in her lower back mainly right side.She rates her pain 7.  Her current exercise regime is walking for short distances using her cadillac walker or cane in her home and using her stationary bicycle weekly for 5-10 minutes.  Her last UDS was on 08/02/2016  it was consistent.     Pain Inventory Average Pain 7 Pain Right Now 7 My pain is tingling and aching  In the last 24 hours, has pain interfered with the following? General activity 10 Relation with others 10 Enjoyment of life 10 What TIME of day is your pain at its worst? all Sleep (in general) Poor  Pain is worse with: walking and standing Pain improves with: rest and medication Relief from Meds: 6  Mobility use a cane use a walker ability to climb steps?  yes do you drive?  yes  Function retired  Neuro/Psych weakness numbness tingling trouble walking depression  Prior Studies Any changes since last visit?  no  Physicians involved in your care Any changes since last visit?  no   Family History  Problem Relation Age of Onset  . Heart failure Mother   . Pneumonia Mother   . Hypertension Mother   . Heart disease Mother   . Stroke Maternal Grandfather   . Hypertension Maternal Grandfather   . Heart attack Father   . Heart disease Father   . Asthma Paternal Uncle        PAT UNCLES  . Heart attack Paternal Uncle    Social History   Social History  . Marital status: Married    Spouse name: N/A  . Number of children: N/A  . Years of education: N/A   Social History Main Topics  . Smoking status: Never Smoker  . Smokeless tobacco: Never Used  . Alcohol use No  . Drug use: No  . Sexual activity: No   Other Topics Concern  . None   Social History  Narrative  . None   Past Surgical History:  Procedure Laterality Date  . ANKLE SURGERY Left    ligament  . APPENDECTOMY  1987   AT [facility]  . BACK SURGERY     Fusion  . BREAST LUMPECTOMY  2003   radiation on right  . BREAST LUMPECTOMY WITH RADIOACTIVE SEED LOCALIZATION Left 02/12/2016   Procedure: LEFT BREAST LUMPECTOMY WITH RADIOACTIVE SEED LOCALIZATION;  Surgeon: Excell Seltzer, MD;  Location: [facility];  Service: General;  Laterality: Left;  . CARDIOVASCULAR STRESS TEST  09/07/05   Nuclear, was negative  . CATARACT EXTRACTION Bilateral 01,03  . OOPHORECTOMY     LSO -RSO  . PELVIC LAPAROSCOPY  1989   RSO, LYSIS OF ADHESIONS  . TOTAL ABDOMINAL HYSTERECTOMY  1987   LSO, APPENDECTOMY  . TOTAL HIP ARTHROPLASTY Right 10/28/2013   dr Lorin Mercy  . TOTAL HIP ARTHROPLASTY Right 10/28/2013   Procedure: TOTAL HIP ARTHROPLASTY ANTERIOR APPROACH;  Surgeon: Marybelle Killings, MD;  Location: [facility];  Service: Orthopedics;  Laterality: Right;  Right Total Hip Arthroplasty, Direct Anterior Approach   Past Medical History:  Diagnosis Date  . Anemia    hx  . Arthritis   . Asthma    PFTs, February, 2011, moderate  obstructive disease with response to bronchodilators, normal lung volumes, moderate reduction in diffusing capacity  . Atrial septal aneurysm    Echo, 2008-not noted on 13 echo  . CAD (coronary artery disease)    90% distal LAD in the past  /   nuclear, 2008, no ischemia, ejection fraction 70%  . Cancer ([place]) 2002   DUCTAL CIS--S/P LUMPECTOMY, RADIATION AND 6 WEEKS OF TAMOXIFEN  . D-dimer, elevated    January, 2014  . Depression   . Ejection fraction    EF 60%, echo, October, 2008  . Elevated CPK    January, 2014  . Endometriosis 1989   RIGHT TUBE  . Endometriosis 1987   LEFT TUBE/OVARY W FOCAL IN-SITU ENDOMETRIAL ADENOCARCINOMA  . GERD (gastroesophageal reflux disease)    occ  . H/O hiatal hernia    ?  [name] Hyperlipidemia   . Hypertension   . Hypothyroidism     Patient has had in the past that she does not need treatment  . Kyphoscoliosis   . Obstructive airway disease ([place])   . Pinched nerve    lower back  . Shortness of breath   . UTI (lower urinary tract infection)    BP (!) 153/75 (BP Location: Left Arm, Patient Position: Sitting, Cuff Size: Normal)   Pulse 70   SpO2 91%   Opioid Risk Score:  0 Fall Risk Score:  `1  Depression screen PHQ 2/9  Depression screen [facility] 2/9 09/08/2016 01/06/2016 07/11/2014  Decreased Interest 0 0 3  Down, Depressed, Hopeless 1 1 2   PHQ - 2 Score 1 1 5   Altered sleeping - - 3  Tired, decreased energy - - 3  Change in appetite - - 0  Feeling bad or failure about yourself  - - 0  Trouble concentrating - - 0  Moving slowly or fidgety/restless - - 0  Suicidal thoughts - - 0  PHQ-9 Score - - 11  Some recent data might be hidden    Review of Systems  HENT: Negative.   Eyes: Negative.   Respiratory: Negative.   Cardiovascular: Negative.   Gastrointestinal: Negative.   Endocrine: Negative.   Genitourinary: Negative.   Musculoskeletal: Positive for gait problem.  Skin: Negative.   Allergic/Immunologic: Negative.   Neurological: Positive for weakness and numbness.       Tingling  Hematological: Negative.   Psychiatric/Behavioral: Positive for dysphoric mood.  All other systems reviewed and are negative.      Objective:   Physical Exam  Constitutional: She is oriented to person, place, and time. She appears well-developed and well-nourished.  HENT:  Head: Normocephalic and atraumatic.  Neck: Normal range of motion. Neck supple.  Cardiovascular: Normal rate and regular rhythm.   Pulmonary/Chest: Effort normal and breath sounds normal.  Musculoskeletal:  Normal Muscle Bulk and Muscle Testing Reveals: Upper Extremities: Full ROM and Muscle Strength 5/5 Lumbar Paraspinal Tenderness: L-3-L-5 Lumbar Scoliosis Lower Extremities: Full ROM and Muscle Strength 5/5 Arises from Table Slowly using walker  for support Narrow Based Gait  Neurological: She is alert and oriented to person, place, and time.  Skin: Skin is warm and dry.  Psychiatric: She has a normal mood and affect.  Nursing note and vitals reviewed.         Assessment & Plan:  1 Lumbar post laminectomy syndrome, s/p lumbar fusion: 09/08/2016 Refilled: Hydrocodone 10/325mg  one tablet every 6 hours #100. Second script given to accommodate scheduled appointment.  We will continue the opioid monitoring program, this consists of  regular clinic visits, examinations, urine drug screen, pill counts as well as use of [place] Controlled Substance Reporting system. 2. Right Hip OA: S/P Right Hip Replacement 10/28/2013. 09/08/2016 3.Depression: Has Family Support and Friends. Counseling with Doristine Bosworth. 09/08/2016 4. Right Greater Trochanteric Tenderness: No Complaints Today. Continue with Ice and Heat Therapy. 09/08/2016  15 minutes of face to face patient care time was spent during this visit. All questions were encouraged and answered.   F/U in 1 month

## 2016-09-08 NOTE — Telephone Encounter (Signed)
On 09/08/2016 the [place] was reviewed no conflict was seen on the [place] with multiple prescribers. Ms. Mervine has a signed narcotic contract with our office. If there were any discrepancies this would have been reported to her physician.

## 2016-09-27 ENCOUNTER — Telehealth: Payer: Self-pay | Admitting: Emergency Medicine

## 2016-09-27 NOTE — Telephone Encounter (Signed)
Call her  -- how often is she using the neb treatments?  Her insurance co will not cover them 4 times a day unless she has an asthma flare

## 2016-09-27 NOTE — Telephone Encounter (Signed)
Received fax from [facility] stating pt can not take Duoneb four times a day unless during exacerbation. Pt last saw Pulm in 2017. Please advise.

## 2016-09-27 NOTE — Telephone Encounter (Signed)
LVM for pt to call back and discuss.  

## 2016-09-28 MED ORDER — IPRATROPIUM-ALBUTEROL 0.5-2.5 (3) MG/3ML IN SOLN: 3.0000 mL | RESPIRATORY_TRACT | 0 refills | Status: DC

## 2016-09-28 NOTE — Telephone Encounter (Signed)
Spoke with pt, she only used duoneb when needed, usually 2 xs a day maybe 1 time a month. Spoke with pharmacy, they were only needed refill auth. Refill sent.

## 2016-09-29 ENCOUNTER — Ambulatory Visit: Payer: Medicare Other | Admitting: Physical Medicine & Rehabilitation

## 2016-09-29 MED ORDER — IPRATROPIUM-ALBUTEROL 0.5-2.5 (3) MG/3ML IN SOLN: 3.0000 mL | Freq: Four times a day (QID) | RESPIRATORY_TRACT | 0 refills | Status: DC | PRN

## 2016-09-29 NOTE — Telephone Encounter (Signed)
Spoke with pt, informed RX has been resent to [facility].

## 2016-09-29 NOTE — Telephone Encounter (Signed)
Patient calling back in regard.  States pharmacy does not have script.  Patient use [facility].  Patient requesting follow up call.  Please call at [phone].

## 2016-09-29 NOTE — Addendum Note (Signed)
Addended by: Terence Lux on: 09/29/2016 03:08 PM   Modules accepted: Orders

## 2016-09-30 ENCOUNTER — Other Ambulatory Visit: Payer: Self-pay | Admitting: Internal Medicine

## 2016-10-01 ENCOUNTER — Ambulatory Visit (INDEPENDENT_AMBULATORY_CARE_PROVIDER_SITE_OTHER): Payer: Medicare Other | Admitting: Family Medicine

## 2016-10-01 ENCOUNTER — Encounter: Payer: Self-pay | Admitting: Family Medicine

## 2016-10-01 VITALS — BP 124/88 | Temp 98.2°F

## 2016-10-01 DIAGNOSIS — J209 Acute bronchitis, unspecified: Secondary | ICD-10-CM | POA: Diagnosis not present

## 2016-10-01 DIAGNOSIS — J4521 Mild intermittent asthma with (acute) exacerbation: Secondary | ICD-10-CM

## 2016-10-01 DIAGNOSIS — J45901 Unspecified asthma with (acute) exacerbation: Secondary | ICD-10-CM | POA: Diagnosis not present

## 2016-10-01 MED ORDER — IPRATROPIUM-ALBUTEROL 0.5-2.5 (3) MG/3ML IN SOLN
3.0000 mL | Freq: Four times a day (QID) | RESPIRATORY_TRACT | Status: DC
  Administered 2016-10-01: 3 mL via RESPIRATORY_TRACT

## 2016-10-01 MED ORDER — AZITHROMYCIN 250 MG PO TABS: ORAL_TABLET | ORAL | 0 refills | Status: DC

## 2016-10-01 MED ORDER — ALBUTEROL SULFATE HFA 108 (90 BASE) MCG/ACT IN AERS: 2.0000 | INHALATION_SPRAY | RESPIRATORY_TRACT | 0 refills | Status: DC | PRN

## 2016-10-01 MED ORDER — IPRATROPIUM-ALBUTEROL 0.5-2.5 (3) MG/3ML IN SOLN: 3.0000 mL | RESPIRATORY_TRACT | 0 refills | Status: DC | PRN

## 2016-10-01 MED ORDER — METHYLPREDNISOLONE 4 MG PO TBPK: ORAL_TABLET | ORAL | 0 refills | Status: DC

## 2016-10-01 NOTE — Progress Notes (Signed)
Pre visit review using our clinic review tool, if applicable. No additional management support is needed unless otherwise documented below in the visit note. 

## 2016-10-01 NOTE — Progress Notes (Signed)
   Subjective:    Patient ID: Hannah Scott, female    DOB: 05/06/1950, 66 y.o.   MRN: [id number]  HPI Here for one week of SOB, wheezing and coughing up green sputum. No fever. Using her nebulizer about twice a day.    Review of Systems  Constitutional: Negative.   HENT: Negative.   Eyes: Negative.   Respiratory: Positive for cough, chest tightness and shortness of breath.   Cardiovascular: Negative.        Objective:   Physical Exam  Constitutional:  Mildly SOB while sitting still  HENT:  Right Ear: External ear normal.  Left Ear: External ear normal.  Nose: Nose normal.  Mouth/Throat: Oropharynx is clear and moist.  Eyes: Conjunctivae are normal.  Neck: No thyromegaly present.  Cardiovascular: Normal rate, regular rhythm, normal heart sounds and intact distal pulses.   Pulmonary/Chest: She has no rales.  Quiet BS with diffuse wheezing, reduced airflow  Musculoskeletal:  Trace edema in the ankles  Lymphadenopathy:    She has cervical adenopathy.          Assessment & Plan:  She has a bronchitis which has triggered an acute flare of her asthma. Given a nebulizer treatment while here. We will treat with a Zpack and a Medrol dose pack. Refilled her nebulizer solution. Recheck prn.  Alysia Penna, MD

## 2016-10-03 ENCOUNTER — Ambulatory Visit: Payer: Medicare Other | Admitting: Internal Medicine

## 2016-10-17 ENCOUNTER — Ambulatory Visit (INDEPENDENT_AMBULATORY_CARE_PROVIDER_SITE_OTHER): Payer: Medicare Other | Admitting: Internal Medicine

## 2016-10-17 ENCOUNTER — Ambulatory Visit (INDEPENDENT_AMBULATORY_CARE_PROVIDER_SITE_OTHER)
Admission: RE | Admit: 2016-10-17 | Discharge: 2016-10-17 | Disposition: A | Discharge status: Home / Self Care | Payer: Medicare Other | Source: Ambulatory Visit | Attending: Internal Medicine | Admitting: Internal Medicine

## 2016-10-17 ENCOUNTER — Encounter: Payer: Self-pay | Admitting: Internal Medicine

## 2016-10-17 VITALS — BP 166/102 | HR 93 | Temp 98.1°F | Resp 18 | Wt 153.0 lb

## 2016-10-17 DIAGNOSIS — J4521 Mild intermittent asthma with (acute) exacerbation: Secondary | ICD-10-CM | POA: Diagnosis not present

## 2016-10-17 DIAGNOSIS — R0989 Other specified symptoms and signs involving the circulatory and respiratory systems: Secondary | ICD-10-CM | POA: Diagnosis not present

## 2016-10-17 DIAGNOSIS — J209 Acute bronchitis, unspecified: Secondary | ICD-10-CM | POA: Insufficient documentation

## 2016-10-17 MED ORDER — CEFDINIR 300 MG PO CAPS: 300.0000 mg | ORAL_CAPSULE | Freq: Two times a day (BID) | ORAL | 0 refills | Status: DC

## 2016-10-17 MED ORDER — PREDNISONE 10 MG PO TABS: ORAL_TABLET | ORAL | 0 refills | Status: DC

## 2016-10-17 NOTE — Assessment & Plan Note (Signed)
Not improved after recent antibiotic, medrol  likely  bacterial infection - causing asthma exacerbation Start omnicef Prednisone taper cxr today Continue neb treatments at home  Call if no improvement

## 2016-10-17 NOTE — Assessment & Plan Note (Signed)
Asthma exacerbation with wheezing, chest tightness, SOB and cough - likely secondary to bacterial infection Start omnicef Prednisone taper cxr today Continue neb treatments at home

## 2016-10-17 NOTE — Progress Notes (Signed)
/*   Subjective:    Patient ID: Hannah Scott, female    DOB: [birth year], 66 y.o.   MRN: [id number]  HPI She is here for an acute visit for asthma.  She saw Dr Sarajane Jews on 6/9 and was prescribed a zpak and medrol for bronchitis and asthma exacerbation.  Her symptoms improved.  Once she finished the medications her symptoms started getting worse again.    She now has wheezing, sob, chest tightness, mild coughing and will occasionally bring up green sputum.  She does not feel better and thinks she still has an infection.  She is using the nebulizer at home twice at it helps, but temporarily.   Medications and allergies reviewed with patient and updated if appropriate.  Patient Active Problem List   Diagnosis Date Noted  . Prediabetes 07/07/2016  . History of breast cancer 01/06/2016  . Intraductal papilloma of left breast 01/06/2016  . Essential hypertension 06/08/2015  . DOE (dyspnea on exertion) 06/08/2015  . Fatigue 05/20/2015  . Back pain 04/22/2015  . Lumbar scoliosis 01/10/2014  . Anemia due to blood loss 11/27/2013  . Elevated CPK   . Multinodular goiter 12/01/2011  . Asthma exacerbation   . Hypothyroidism   . CAD (coronary artery disease)   . Atrial septal aneurysm   . Hyperlipidemia   . Scoliosis   . Spinal stenosis, lumbar region, with neurogenic claudication 07/01/2011  . Lumbar postlaminectomy syndrome 07/01/2011  . Osteoarthritis of right hip 07/01/2011  . Contracture of right hip 07/01/2011  . Endometriosis   . Depression 12/24/2009  . Migraine headache 05/19/2006    Current Outpatient Prescriptions on File Prior to Visit  Medication Sig Dispense Refill  . albuterol (PROVENTIL HFA;VENTOLIN HFA) 108 (90 Base) MCG/ACT inhaler Inhale 2 puffs into the lungs every 4 (four) hours as needed for wheezing or shortness of breath. 1 Inhaler 0  . atorvastatin (LIPITOR) 20 MG tablet TAKE ONE TABLET EVERY DAY 90 tablet 1  . benazepril (LOTENSIN) 40 MG tablet TAKE 1/2 TABLET EVERY  DAY 45 tablet 1  . DULoxetine (CYMBALTA) 30 MG capsule TAKE ONE CAPSULE EVERY DAY 30 capsule 1  . gabapentin (NEURONTIN) 400 MG capsule Take 1 capsule (400 mg total) by mouth at bedtime. 30 capsule 5  . HYDROcodone-acetaminophen (NORCO) 10-325 MG tablet Take 1 tablet by mouth every 6 (six) hours as needed. 100 tablet 0  . ipratropium-albuterol (DUONEB) 0.5-2.5 (3) MG/3ML SOLN Take 3 mLs by nebulization every 4 (four) hours as needed (wheezing or SOB). During exacerbation 360 mL 0  . levothyroxine (SYNTHROID, LEVOTHROID) 75 MCG tablet TAKE ONE TABLET BY MOUTH DAILY BEFORE BREAKFAST 30 tablet 1  . metoprolol tartrate (LOPRESSOR) 25 MG tablet Take 25 mg by mouth 2 (two) times daily.     Current Facility-Administered Medications on File Prior to Visit  Medication Dose Route Frequency Provider Last Rate Last Dose  . ipratropium-albuterol (DUONEB) 0.5-2.5 (3) MG/3ML nebulizer solution 3 mL  3 mL Nebulization Q6H Laurey Morale, MD   3 mL at 10/01/16 8841    Past Medical History:  Diagnosis Date  . Anemia    hx  . Arthritis   . Asthma    PFTs, February, 2011, moderate obstructive disease with response to bronchodilators, normal lung volumes, moderate reduction in diffusing capacity  . Atrial septal aneurysm    Echo, 2008-not noted on 13 echo  . CAD (coronary artery disease)    90% distal LAD in the past  /   nuclear, 2008,  no ischemia, ejection fraction 70%  . Cancer ([place]) 2002   DUCTAL CIS--S/P LUMPECTOMY, RADIATION AND 6 WEEKS OF TAMOXIFEN  . D-dimer, elevated    January, 2014  . Depression   . Ejection fraction    EF 60%, echo, October, 2008  . Elevated CPK    January, 2014  . Endometriosis 1989   RIGHT TUBE  . Endometriosis 1987   LEFT TUBE/OVARY W FOCAL IN-SITU ENDOMETRIAL ADENOCARCINOMA  . GERD (gastroesophageal reflux disease)    occ  . H/O hiatal hernia    ?  [name] Hyperlipidemia   . Hypertension   . Hypothyroidism    Patient has had in the past that she does not need  treatment  . Kyphoscoliosis   . Obstructive airway disease ([place])   . Pinched nerve    lower back  . Shortness of breath   . UTI (lower urinary tract infection)     Past Surgical History:  Procedure Laterality Date  . ANKLE SURGERY Left    ligament  . APPENDECTOMY  1987   AT [facility]  . BACK SURGERY     Fusion  . BREAST LUMPECTOMY  2003   radiation on right  . BREAST LUMPECTOMY WITH RADIOACTIVE SEED LOCALIZATION Left 02/12/2016   Procedure: LEFT BREAST LUMPECTOMY WITH RADIOACTIVE SEED LOCALIZATION;  Surgeon: Excell Seltzer, MD;  Location: [facility];  Service: General;  Laterality: Left;  . CARDIOVASCULAR STRESS TEST  09/07/05   Nuclear, was negative  . CATARACT EXTRACTION Bilateral 01,03  . OOPHORECTOMY     LSO -RSO  . PELVIC LAPAROSCOPY  1989   RSO, LYSIS OF ADHESIONS  . TOTAL ABDOMINAL HYSTERECTOMY  1987   LSO, APPENDECTOMY  . TOTAL HIP ARTHROPLASTY Right 10/28/2013   dr Lorin Mercy  . TOTAL HIP ARTHROPLASTY Right 10/28/2013   Procedure: TOTAL HIP ARTHROPLASTY ANTERIOR APPROACH;  Surgeon: Marybelle Killings, MD;  Location: [facility];  Service: Orthopedics;  Laterality: Right;  Right Total Hip Arthroplasty, Direct Anterior Approach    Social History   Social History  . Marital status: Married    Spouse name: N/A  . Number of children: N/A  . Years of education: N/A   Social History Main Topics  . Smoking status: Never Smoker  . Smokeless tobacco: Never Used  . Alcohol use No  . Drug use: No  . Sexual activity: No   Other Topics Concern  . Not on file   Social History Narrative  . No narrative on file    Family History  Problem Relation Age of Onset  . Heart failure Mother   . Pneumonia Mother   . Hypertension Mother   . Heart disease Mother   . Stroke Maternal Grandfather   . Hypertension Maternal Grandfather   . Heart attack Father   . Heart disease Father   . Asthma Paternal Uncle        PAT UNCLES  . Heart attack Paternal Uncle     Review of  Systems  Constitutional: Negative for chills and fever.  HENT: Positive for postnasal drip. Negative for congestion, ear pain, sinus pressure and sore throat.   Respiratory: Positive for cough, chest tightness, shortness of breath and wheezing.   Gastrointestinal: Negative for diarrhea.  Neurological: Negative for light-headedness and headaches.       Objective:   Vitals:   10/17/16 1446 10/17/16 1501  BP: (!) 232/132 (!) 166/102  Pulse: 93   Resp: 18   Temp: 98.1 F (36.7 C)  Filed Weights   10/17/16 1446  Weight: 153 lb (69.4 kg)   Body mass index is 29.88 kg/m.  Wt Readings from Last 3 Encounters:  10/17/16 153 lb (69.4 kg)  07/06/16 157 lb (71.2 kg)  06/16/16 157 lb 12.8 oz (71.6 kg)     Physical Exam GENERAL APPEARANCE: Appears stated age, well appearing, NAD EYES: conjunctiva clear, no icterus HEENT: bilateral tympanic membranes and ear canals normal, oropharynx with no erythema, no thyromegaly, trachea midline, no cervical or supraclavicular lymphadenopathy LUNGS: Clear to auscultation without wheeze or crackles, unlabored breathing, good air entry bilaterally HEART: Normal S1,S2 without murmurs EXTREMITIES: Without clubbing, cyanosis, or edema        Assessment & Plan:   See Problem List for Assessment and Plan of chronic medical problems.

## 2016-10-17 NOTE — Patient Instructions (Signed)
Monitor your BP at home closely.    Have a chest xray today.   Start the antibiotic and prednisone taper and take as directed.   Call if no improvement

## 2016-10-18 ENCOUNTER — Ambulatory Visit (HOSPITAL_BASED_OUTPATIENT_CLINIC_OR_DEPARTMENT_OTHER): Payer: Medicare Other | Admitting: Physical Medicine & Rehabilitation

## 2016-10-18 ENCOUNTER — Encounter: Payer: Self-pay | Admitting: Physical Medicine & Rehabilitation

## 2016-10-18 ENCOUNTER — Other Ambulatory Visit: Payer: Self-pay | Admitting: Internal Medicine

## 2016-10-18 ENCOUNTER — Encounter: Payer: Medicare Other | Attending: Physical Medicine & Rehabilitation

## 2016-10-18 VITALS — BP 133/70 | HR 95 | Resp 14

## 2016-10-18 DIAGNOSIS — M48061 Spinal stenosis, lumbar region without neurogenic claudication: Secondary | ICD-10-CM | POA: Diagnosis not present

## 2016-10-18 DIAGNOSIS — M1611 Unilateral primary osteoarthritis, right hip: Secondary | ICD-10-CM | POA: Insufficient documentation

## 2016-10-18 DIAGNOSIS — J189 Pneumonia, unspecified organism: Secondary | ICD-10-CM

## 2016-10-18 DIAGNOSIS — M4126 Other idiopathic scoliosis, lumbar region: Secondary | ICD-10-CM | POA: Insufficient documentation

## 2016-10-18 DIAGNOSIS — Z5181 Encounter for therapeutic drug level monitoring: Secondary | ICD-10-CM | POA: Diagnosis not present

## 2016-10-18 DIAGNOSIS — M961 Postlaminectomy syndrome, not elsewhere classified: Secondary | ICD-10-CM | POA: Diagnosis not present

## 2016-10-18 DIAGNOSIS — G894 Chronic pain syndrome: Secondary | ICD-10-CM

## 2016-10-18 DIAGNOSIS — Z79899 Other long term (current) drug therapy: Secondary | ICD-10-CM | POA: Diagnosis not present

## 2016-10-18 DIAGNOSIS — J181 Lobar pneumonia, unspecified organism: Principal | ICD-10-CM

## 2016-10-18 MED ORDER — HYDROCODONE-ACETAMINOPHEN 10-325 MG PO TABS: 1.0000 | ORAL_TABLET | Freq: Three times a day (TID) | ORAL | 0 refills | Status: DC | PRN

## 2016-10-18 NOTE — Progress Notes (Signed)
Subjective:    Patient ID: Hannah Scott, female    DOB: 10-Nov-1950, 66 y.o.   MRN: [id number]  HPI 66 year old female with chronic low back pain and right hip pain. She underwent hip replacement 10/28/2013, this did not help with her back pain did relieve some of her hip pain but still has some residual contracture. She has history of severe kyphoscoliosis. She requires some assistance for housekeeping. Independent with all her self-care and mobility is with a walker.  Pain Inventory Average Pain 7 Pain Right Now 6 My pain is constant, burning, tingling and aching  In the last 24 hours, has pain interfered with the following? General activity 10 Relation with others 10 Enjoyment of life 10 What TIME of day is your pain at its worst? NA Sleep (in general) all  Pain is worse with: walking and standing Pain improves with: rest and medication Relief from Meds: 6  Mobility walk with assistance use a cane use a walker ability to climb steps?  yes do you drive?  yes  Function disabled: date disabled . I need assistance with the following:  toileting and household duties  Neuro/Psych weakness numbness tingling trouble walking depression  Prior Studies Any changes since last visit?  no  Physicians involved in your care Any changes since last visit?  no   Family History  Problem Relation Age of Onset  . Heart failure Mother   . Pneumonia Mother   . Hypertension Mother   . Heart disease Mother   . Stroke Maternal Grandfather   . Hypertension Maternal Grandfather   . Heart attack Father   . Heart disease Father   . Asthma Paternal Uncle        PAT UNCLES  . Heart attack Paternal Uncle    Social History   Social History  . Marital status: Married    Spouse name: N/A  . Number of children: N/A  . Years of education: N/A   Social History Main Topics  . Smoking status: Never Smoker  . Smokeless tobacco: Never Used  . Alcohol use No  . Drug use: No  .  Sexual activity: No   Other Topics Concern  . None   Social History Narrative  . None   Past Surgical History:  Procedure Laterality Date  . ANKLE SURGERY Left    ligament  . APPENDECTOMY  1987   AT [facility]  . BACK SURGERY     Fusion  . BREAST LUMPECTOMY  2003   radiation on right  . BREAST LUMPECTOMY WITH RADIOACTIVE SEED LOCALIZATION Left 02/12/2016   Procedure: LEFT BREAST LUMPECTOMY WITH RADIOACTIVE SEED LOCALIZATION;  Surgeon: Excell Seltzer, MD;  Location: [facility];  Service: General;  Laterality: Left;  . CARDIOVASCULAR STRESS TEST  09/07/05   Nuclear, was negative  . CATARACT EXTRACTION Bilateral 01,03  . OOPHORECTOMY     LSO -RSO  . PELVIC LAPAROSCOPY  1989   RSO, LYSIS OF ADHESIONS  . TOTAL ABDOMINAL HYSTERECTOMY  1987   LSO, APPENDECTOMY  . TOTAL HIP ARTHROPLASTY Right 10/28/2013   dr Lorin Mercy  . TOTAL HIP ARTHROPLASTY Right 10/28/2013   Procedure: TOTAL HIP ARTHROPLASTY ANTERIOR APPROACH;  Surgeon: Marybelle Killings, MD;  Location: [facility];  Service: Orthopedics;  Laterality: Right;  Right Total Hip Arthroplasty, Direct Anterior Approach   Past Medical History:  Diagnosis Date  . Anemia    hx  . Arthritis   . Asthma    PFTs, February, 2011, moderate obstructive  disease with response to bronchodilators, normal lung volumes, moderate reduction in diffusing capacity  . Atrial septal aneurysm    Echo, 2008-not noted on 13 echo  . CAD (coronary artery disease)    90% distal LAD in the past  /   nuclear, 2008, no ischemia, ejection fraction 70%  . Cancer ([place]) 2002   DUCTAL CIS--S/P LUMPECTOMY, RADIATION AND 6 WEEKS OF TAMOXIFEN  . D-dimer, elevated    January, 2014  . Depression   . Ejection fraction    EF 60%, echo, October, 2008  . Elevated CPK    January, 2014  . Endometriosis 1989   RIGHT TUBE  . Endometriosis 1987   LEFT TUBE/OVARY W FOCAL IN-SITU ENDOMETRIAL ADENOCARCINOMA  . GERD (gastroesophageal reflux disease)    occ  . H/O hiatal hernia     ?  [name] Hyperlipidemia   . Hypertension   . Hypothyroidism    Patient has had in the past that she does not need treatment  . Kyphoscoliosis   . Obstructive airway disease ([place])   . Pinched nerve    lower back  . Shortness of breath   . UTI (lower urinary tract infection)    BP 133/70 (BP Location: Left Arm, Patient Position: Sitting, Cuff Size: Normal)   Pulse 95   Resp 14   SpO2 93%   Opioid Risk Score:   Fall Risk Score:  `1  Depression screen PHQ 2/9  Depression screen [facility] 2/9 09/08/2016 01/06/2016 07/11/2014  Decreased Interest 0 0 3  Down, Depressed, Hopeless 1 1 2   PHQ - 2 Score 1 1 5   Altered sleeping - - 3  Tired, decreased energy - - 3  Change in appetite - - 0  Feeling bad or failure about yourself  - - 0  Trouble concentrating - - 0  Moving slowly or fidgety/restless - - 0  Suicidal thoughts - - 0  PHQ-9 Score - - 11  Some recent data might be hidden    Review of Systems  HENT: Negative.   Eyes: Negative.   Respiratory: Negative.   Cardiovascular: Negative.   Gastrointestinal: Negative.   Endocrine: Negative.   Genitourinary: Negative.   Musculoskeletal: Positive for back pain and gait problem.  Skin: Negative.   Allergic/Immunologic: Negative.   Neurological: Positive for weakness and numbness.       Tingling  Hematological: Negative.   Psychiatric/Behavioral: Positive for dysphoric mood.  All other systems reviewed and are negative.      Objective:   Physical Exam  Constitutional: She is oriented to person, place, and time. She appears well-developed and well-nourished.  HENT:  Head: Normocephalic and atraumatic.  Eyes: Conjunctivae and EOM are normal. Pupils are equal, round, and reactive to light.  Neck: Normal range of motion.  Neurological: She is alert and oriented to person, place, and time. Gait abnormal.  Motor strength is 4/5 bilateral, knee extensors, ankle dorsi flexors 3 minus, right hip flexor, 4 at the left hip flexor Gait is  using a walker. She has forward flexed posture with decreased stride length, right lower extremity. No evidence of foot drag  Psychiatric: She has a normal mood and affect.  Nursing note and vitals reviewed.         Assessment & Plan:  1. History of chronic low back pain, history of kyphoscoliosis, which is severe and deforming. Continued hydrocodone 10/325 one tablet 3 times a day, #90, 1 month supply. Continue Cymbalta 30 M per day Continue gabapentin 400 mg  daily at bedtime  Continue opioid monitoring program. This consists of regular clinic visits, examinations, urine drug screen, pill counts as well as use of [place] controlled substance reporting System. Last UDS. 2 months ago, which was consistent

## 2016-11-01 ENCOUNTER — Ambulatory Visit
Admission: RE | Admit: 2016-11-01 | Discharge: 2016-11-01 | Disposition: A | Discharge status: Home / Self Care | Payer: Medicare Other | Source: Ambulatory Visit | Attending: Internal Medicine | Admitting: Internal Medicine

## 2016-11-01 DIAGNOSIS — J181 Lobar pneumonia, unspecified organism: Principal | ICD-10-CM

## 2016-11-01 DIAGNOSIS — J189 Pneumonia, unspecified organism: Secondary | ICD-10-CM

## 2016-11-09 ENCOUNTER — Ambulatory Visit (INDEPENDENT_AMBULATORY_CARE_PROVIDER_SITE_OTHER)
Admission: RE | Admit: 2016-11-09 | Discharge: 2016-11-09 | Disposition: A | Discharge status: Home / Self Care | Payer: Medicare Other | Source: Ambulatory Visit | Attending: Internal Medicine | Admitting: Internal Medicine

## 2016-11-09 ENCOUNTER — Encounter: Payer: Self-pay | Admitting: Internal Medicine

## 2016-11-09 ENCOUNTER — Ambulatory Visit (INDEPENDENT_AMBULATORY_CARE_PROVIDER_SITE_OTHER): Payer: Medicare Other | Admitting: Family Medicine

## 2016-11-09 VITALS — BP 140/84 | HR 71 | Temp 98.0°F | Ht 60.0 in | Wt 154.0 lb

## 2016-11-09 DIAGNOSIS — J181 Lobar pneumonia, unspecified organism: Secondary | ICD-10-CM | POA: Diagnosis not present

## 2016-11-09 DIAGNOSIS — J4521 Mild intermittent asthma with (acute) exacerbation: Secondary | ICD-10-CM

## 2016-11-09 DIAGNOSIS — R05 Cough: Secondary | ICD-10-CM | POA: Diagnosis not present

## 2016-11-09 DIAGNOSIS — R0602 Shortness of breath: Secondary | ICD-10-CM | POA: Diagnosis not present

## 2016-11-09 MED ORDER — PREDNISONE 10 MG PO TABS: 10.0000 mg | ORAL_TABLET | Freq: Every day | ORAL | 0 refills | Status: DC

## 2016-11-09 NOTE — Progress Notes (Signed)
Pre visit review using our clinic review tool, if applicable. No additional management support is needed unless otherwise documented below in the visit note. 

## 2016-11-09 NOTE — Patient Instructions (Addendum)
Please have your follow up chest xray today  Please go to [email or web address] to enroll for patient assistance  You can also call the [phone] and ask if you can get your prescription from them  I have given you a Breo sample to use- it is once a day  I have also sent in a low dose of prednisone. If you are feeling better after 3-5 days of starting the [facility], you can stop the prednisone

## 2016-11-09 NOTE — Progress Notes (Signed)
Subjective:    Patient ID: Hannah Scott, female    DOB: [birth year], 66 y.o.   MRN: [id number]  HPI This is a 66 yo female who presents today with worsening of asthma symptoms. She was seen 10/01/16 and 10/17/16 and given steroids and antibiotics with improvement. Chest xray on 6/ 25 showed atelectasis or pneumonia. Tolerated prednisone well. Noticed that symptoms returned after prednisone finished. Hearing wheezing and feeling SOB. She has been using duo-neb nebulizer 4x/ day. Sputum production improved with treatment, much less cough now. No fever. Did duo-neb treatment about 1 hour ago.   Has been prescribed Brio in the past but couldn't afford to get it filled. Qvar on medication list    Past Medical History:  Diagnosis Date  . Anemia    hx  . Arthritis   . Asthma    PFTs, February, 2011, moderate obstructive disease with response to bronchodilators, normal lung volumes, moderate reduction in diffusing capacity  . Atrial septal aneurysm    Echo, 2008-not noted on 13 echo  . CAD (coronary artery disease)    90% distal LAD in the past  /   nuclear, 2008, no ischemia, ejection fraction 70%  . Cancer ([place]) 2002   DUCTAL CIS--S/P LUMPECTOMY, RADIATION AND 6 WEEKS OF TAMOXIFEN  . D-dimer, elevated    January, 2014  . Depression   . Ejection fraction    EF 60%, echo, October, 2008  . Elevated CPK    January, 2014  . Endometriosis 1989   RIGHT TUBE  . Endometriosis 1987   LEFT TUBE/OVARY W FOCAL IN-SITU ENDOMETRIAL ADENOCARCINOMA  . GERD (gastroesophageal reflux disease)    occ  . H/O hiatal hernia    ?  [name] Hyperlipidemia   . Hypertension   . Hypothyroidism    Patient has had in the past that she does not need treatment  . Kyphoscoliosis   . Obstructive airway disease ([place])   . Pinched nerve    lower back  . Shortness of breath   . UTI (lower urinary tract infection)    Past Surgical History:  Procedure Laterality Date  . ANKLE SURGERY Left    ligament  . APPENDECTOMY   1987   AT [facility]  . BACK SURGERY     Fusion  . BREAST LUMPECTOMY  2003   radiation on right  . BREAST LUMPECTOMY WITH RADIOACTIVE SEED LOCALIZATION Left 02/12/2016   Procedure: LEFT BREAST LUMPECTOMY WITH RADIOACTIVE SEED LOCALIZATION;  Surgeon: Excell Seltzer, MD;  Location: [facility];  Service: General;  Laterality: Left;  . CARDIOVASCULAR STRESS TEST  09/07/05   Nuclear, was negative  . CATARACT EXTRACTION Bilateral 01,03  . OOPHORECTOMY     LSO -RSO  . PELVIC LAPAROSCOPY  1989   RSO, LYSIS OF ADHESIONS  . TOTAL ABDOMINAL HYSTERECTOMY  1987   LSO, APPENDECTOMY  . TOTAL HIP ARTHROPLASTY Right 10/28/2013   dr Lorin Mercy  . TOTAL HIP ARTHROPLASTY Right 10/28/2013   Procedure: TOTAL HIP ARTHROPLASTY ANTERIOR APPROACH;  Surgeon: Marybelle Killings, MD;  Location: [facility];  Service: Orthopedics;  Laterality: Right;  Right Total Hip Arthroplasty, Direct Anterior Approach   Family History  Problem Relation Age of Onset  . Heart failure Mother   . Pneumonia Mother   . Hypertension Mother   . Heart disease Mother   . Stroke Maternal Grandfather   . Hypertension Maternal Grandfather   . Heart attack Father   . Heart disease Father   . Asthma Paternal  Uncle        PAT UNCLES  . Heart attack Paternal Uncle    Social History  Substance Use Topics  . Smoking status: Never Smoker  . Smokeless tobacco: Never Used  . Alcohol use No      Review of Systems Per HPI    Objective:   Physical Exam  Constitutional: She is oriented to person, place, and time. She appears well-developed and well-nourished. No distress.  obese  HENT:  Head: Normocephalic and atraumatic.  Mouth/Throat: Oropharynx is clear and moist.  Cardiovascular: Normal rate, regular rhythm and normal heart sounds.   Pulmonary/Chest: Effort normal and breath sounds normal. No respiratory distress. She has no wheezes. She has no rales.  Neurological: She is alert and oriented to person, place, and time.  Skin: Skin  is warm and dry. She is not diaphoretic.  Psychiatric: She has a normal mood and affect. Her behavior is normal. Judgment and thought content normal.  Vitals reviewed.        BP 140/84 (BP Location: Left Arm, Patient Position: Sitting, Cuff Size: Normal)   Pulse 71   Temp 98 F (36.7 C) (Oral)   Ht 5' (1.524 m)   Wt 154 lb (69.9 kg)   SpO2 (!) 89%   BMI 30.08 kg/m  Wt Readings from Last 3 Encounters:  11/09/16 154 lb (69.9 kg)  10/17/16 153 lb (69.4 kg)  07/06/16 157 lb (71.2 kg)   Repeat pulse ox 95  Assessment & Plan:  1. Mild intermittent asthma with exacerbation - patient needs to be on ICS but has been unable to afford, provided Breo sample provided to patient and she was given information to request assistance. She was instructed to call office if she is unable to get assistance. Discussed importance of transitioning from oral steroids to ICS. Will continue prednisone while getting on Breo. - Medication Samples have been provided to the patient. Drug name: Adair Patter       Strength: 200/25        Qty: 1  LOT: [id number]  Exp.Date: 01/2018 Dosing instructions: one puff daily  The patient has been instructed regarding the correct time, dose, and frequency of taking this medication, including desired effects and most common side effects.  - Meds ordered this encounter  Medications  . predniSONE (DELTASONE) 10 MG tablet    Sig: Take 1 tablet (10 mg total) by mouth daily with breakfast.    Dispense:  10 tablet    Refill:  0    Order Specific Question:   Supervising Provider    Answer:   Juleen China, ERICA [[id number]]    Elby Beck 8:16 AM 11/11/2016  - predniSONE (DELTASONE) 10 MG tablet; Take 1 tablet (10 mg total) by mouth daily with breakfast.  Dispense: 10 tablet; Refill: 0 - she will have her follow up CXR while here today (previously ordered by Dr. Quay Burow) - follow up as scheduled 9/18 with Dr. Quay Burow, sooner if symptoms not improved  Clarene Reamer,  FNP-BC  [facility]  11/11/2016 8:15 AM

## 2016-11-22 ENCOUNTER — Other Ambulatory Visit: Payer: Self-pay | Admitting: Endocrinology

## 2016-11-23 ENCOUNTER — Encounter: Payer: Self-pay | Admitting: Family Medicine

## 2016-12-22 ENCOUNTER — Other Ambulatory Visit: Payer: Self-pay | Admitting: Internal Medicine

## 2016-12-22 ENCOUNTER — Encounter: Payer: Self-pay | Admitting: Internal Medicine

## 2016-12-22 DIAGNOSIS — Z780 Asymptomatic menopausal state: Secondary | ICD-10-CM | POA: Diagnosis not present

## 2016-12-22 DIAGNOSIS — R928 Other abnormal and inconclusive findings on diagnostic imaging of breast: Secondary | ICD-10-CM | POA: Diagnosis not present

## 2016-12-22 DIAGNOSIS — M8589 Other specified disorders of bone density and structure, multiple sites: Secondary | ICD-10-CM | POA: Diagnosis not present

## 2016-12-22 LAB — HM MAMMOGRAPHY

## 2016-12-28 ENCOUNTER — Encounter: Payer: Self-pay | Admitting: Internal Medicine

## 2017-01-02 ENCOUNTER — Encounter: Payer: Self-pay | Admitting: Registered Nurse

## 2017-01-02 ENCOUNTER — Telehealth: Payer: Self-pay | Admitting: Registered Nurse

## 2017-01-02 ENCOUNTER — Encounter: Payer: Medicare Other | Attending: Physical Medicine & Rehabilitation | Admitting: Registered Nurse

## 2017-01-02 VITALS — BP 144/83 | HR 71

## 2017-01-02 DIAGNOSIS — Z5181 Encounter for therapeutic drug level monitoring: Secondary | ICD-10-CM

## 2017-01-02 DIAGNOSIS — M4126 Other idiopathic scoliosis, lumbar region: Secondary | ICD-10-CM | POA: Insufficient documentation

## 2017-01-02 DIAGNOSIS — M48061 Spinal stenosis, lumbar region without neurogenic claudication: Secondary | ICD-10-CM | POA: Diagnosis not present

## 2017-01-02 DIAGNOSIS — M1611 Unilateral primary osteoarthritis, right hip: Secondary | ICD-10-CM | POA: Insufficient documentation

## 2017-01-02 DIAGNOSIS — M419 Scoliosis, unspecified: Secondary | ICD-10-CM

## 2017-01-02 DIAGNOSIS — G894 Chronic pain syndrome: Secondary | ICD-10-CM

## 2017-01-02 DIAGNOSIS — Z79899 Other long term (current) drug therapy: Secondary | ICD-10-CM

## 2017-01-02 DIAGNOSIS — M961 Postlaminectomy syndrome, not elsewhere classified: Secondary | ICD-10-CM

## 2017-01-02 DIAGNOSIS — M24551 Contracture, right hip: Secondary | ICD-10-CM | POA: Diagnosis not present

## 2017-01-02 MED ORDER — HYDROCODONE-ACETAMINOPHEN 10-325 MG PO TABS: 1.0000 | ORAL_TABLET | Freq: Three times a day (TID) | ORAL | 0 refills | Status: DC | PRN

## 2017-01-02 NOTE — Telephone Encounter (Signed)
On 01/02/2017 the [facility] was reviewed no conflict was seen on the [place] with multiple prescribers. Hannah Scott has a signed narcotic contract with our office. If there were any discrepancies this would have been reported to her physician.

## 2017-01-02 NOTE — Progress Notes (Signed)
Subjective:    Patient ID: Hannah Scott, female    DOB: 08-09-1950, 66 y.o.   MRN: [id number]  HPI: Hannah Scott is a 66 year old female who returns for follow up appointmentfor chronic pain and medication refill. She states her pain is located in her lower back mainly right side and right hip. She rates her pain 7. Her current exercise regime is walking for short distances using her cadillac walker or cane in her home and using her stationary bicycle weekly for 5-10 minutes.  Her last UDS was on 08/02/2016  it was consistent.     Pain Inventory Average Pain 8 Pain Right Now 7 My pain is constant, burning, tingling and aching  In the last 24 hours, has pain interfered with the following? General activity 10 Relation with others 10 Enjoyment of life 10 What TIME of day is your pain at its worst? daytime evening and night Sleep (in general) Poor  Pain is worse with: walking and standing Pain improves with: rest and medication Relief from Meds: 4  Mobility use a cane use a walker ability to climb steps?  yes do you drive?  yes  Function disabled: date disabled . I need assistance with the following:  household duties  Neuro/Psych weakness numbness tingling trouble walking depression  Prior Studies Any changes since last visit?  no  Physicians involved in your care Any changes since last visit?  no   Family History  Problem Relation Age of Onset  . Heart failure Mother   . Pneumonia Mother   . Hypertension Mother   . Heart disease Mother   . Stroke Maternal Grandfather   . Hypertension Maternal Grandfather   . Heart attack Father   . Heart disease Father   . Asthma Paternal Uncle        PAT UNCLES  . Heart attack Paternal Uncle    Social History   Social History  . Marital status: Married    Spouse name: N/A  . Number of children: N/A  . Years of education: N/A   Social History Main Topics  . Smoking status: Never Smoker  . Smokeless tobacco:  Never Used  . Alcohol use No  . Drug use: No  . Sexual activity: No   Other Topics Concern  . None   Social History Narrative  . None   Past Surgical History:  Procedure Laterality Date  . ANKLE SURGERY Left    ligament  . APPENDECTOMY  1987   AT [facility]  . BACK SURGERY     Fusion  . BREAST LUMPECTOMY  2003   radiation on right  . BREAST LUMPECTOMY WITH RADIOACTIVE SEED LOCALIZATION Left 02/12/2016   Procedure: LEFT BREAST LUMPECTOMY WITH RADIOACTIVE SEED LOCALIZATION;  Surgeon: Excell Seltzer, MD;  Location: [place];  Service: General;  Laterality: Left;  . CARDIOVASCULAR STRESS TEST  09/07/05   Nuclear, was negative  . CATARACT EXTRACTION Bilateral 01,03  . OOPHORECTOMY     LSO -RSO  . PELVIC LAPAROSCOPY  1989   RSO, LYSIS OF ADHESIONS  . TOTAL ABDOMINAL HYSTERECTOMY  1987   LSO, APPENDECTOMY  . TOTAL HIP ARTHROPLASTY Right 10/28/2013   dr Lorin Mercy  . TOTAL HIP ARTHROPLASTY Right 10/28/2013   Procedure: TOTAL HIP ARTHROPLASTY ANTERIOR APPROACH;  Surgeon: Marybelle Killings, MD;  Location: [place];  Service: Orthopedics;  Laterality: Right;  Right Total Hip Arthroplasty, Direct Anterior Approach   Past Medical History:  Diagnosis Date  .  Anemia    hx  . Arthritis   . Asthma    PFTs, February, 2011, moderate obstructive disease with response to bronchodilators, normal lung volumes, moderate reduction in diffusing capacity  . Atrial septal aneurysm    Echo, 2008-not noted on 13 echo  . CAD (coronary artery disease)    90% distal LAD in the past  /   nuclear, 2008, no ischemia, ejection fraction 70%  . Cancer ([place]) 2002   DUCTAL CIS--S/P LUMPECTOMY, RADIATION AND 6 WEEKS OF TAMOXIFEN  . D-dimer, elevated    January, 2014  . Depression   . Ejection fraction    EF 60%, echo, October, 2008  . Elevated CPK    January, 2014  . Endometriosis 1989   RIGHT TUBE  . Endometriosis 1987   LEFT TUBE/OVARY W FOCAL IN-SITU ENDOMETRIAL ADENOCARCINOMA  . GERD  (gastroesophageal reflux disease)    occ  . H/O hiatal hernia    ?  [name] Hyperlipidemia   . Hypertension   . Hypothyroidism    Patient has had in the past that she does not need treatment  . Kyphoscoliosis   . Obstructive airway disease ([place])   . Pinched nerve    lower back  . Shortness of breath   . UTI (lower urinary tract infection)    BP (!) 144/83 (BP Location: Left Arm, Patient Position: Sitting, Cuff Size: Normal)   Pulse 71   SpO2 95%   Opioid Risk Score:  0 Fall Risk Score:  `1  Depression screen PHQ 2/9  Depression screen [facility] 2/9 01/02/2017 09/08/2016 01/06/2016 07/11/2014  Decreased Interest 0 0 0 3  Down, Depressed, Hopeless 1 1 1 2   PHQ - 2 Score 1 1 1 5   Altered sleeping - - - 3  Tired, decreased energy - - - 3  Change in appetite - - - 0  Feeling bad or failure about yourself  - - - 0  Trouble concentrating - - - 0  Moving slowly or fidgety/restless - - - 0  Suicidal thoughts - - - 0  PHQ-9 Score - - - 11  Some recent data might be hidden    Review of Systems  HENT: Negative.   Eyes: Negative.   Respiratory: Negative.   Cardiovascular: Negative.   Gastrointestinal: Negative.   Endocrine: Negative.   Genitourinary: Negative.   Musculoskeletal: Positive for gait problem.  Skin: Negative.   Allergic/Immunologic: Negative.   Neurological: Positive for weakness and numbness.       Tingling  Hematological: Negative.   Psychiatric/Behavioral: Positive for dysphoric mood.  All other systems reviewed and are negative.      Objective:   Physical Exam  Constitutional: She is oriented to person, place, and time. She appears well-developed and well-nourished.  HENT:  Head: Normocephalic and atraumatic.  Neck: Normal range of motion. Neck supple.  Cardiovascular: Normal rate and regular rhythm.   Pulmonary/Chest: Effort normal and breath sounds normal.  Musculoskeletal:  Normal Muscle Bulk and Muscle Testing Reveals: Upper Extremities: Full ROM and Muscle  Strength 5/5 Lumbar Paraspinal Tenderness: L-4-L-5 Mainly Right Side Lumbar Scoliosis Right Greater Trochanter Tenderness Lower Extremities: Full ROM and Muscle Strength 5/5 Right Lower Extremity Flexion Produces Pain into Right Hip Arises from Table Slowly using walker for support Narrow Based Gait  Neurological: She is alert and oriented to person, place, and time.  Skin: Skin is warm and dry.  Psychiatric: She has a normal mood and affect.  Nursing note and vitals reviewed.  Assessment & Plan:  1 Lumbar post laminectomy syndrome, s/p lumbar fusion: 01/02/2017 Refilled: Hydrocodone 10/325mg  one tablet every 6 hours #100. We will continue the opioid monitoring program, this consists of regular clinic visits, examinations, urine drug screen, pill counts as well as use of [place] Controlled Substance Reporting system. 2. Right Hip OA: S/P Right Hip Replacement 10/28/2013. 01/02/2017 3.Depression: Has Family Support and Friends. Counseling with Doristine Bosworth. 01/02/2017 4. Right Greater Trochanteric Tenderness: Continue with Ice and Heat Therapy. 01/02/2017  20 minutes of face to face patient care time was spent during this visit. All questions were encouraged and answered.  F/U in 1 month

## 2017-01-07 ENCOUNTER — Encounter: Payer: Self-pay | Admitting: Internal Medicine

## 2017-01-07 DIAGNOSIS — M810 Age-related osteoporosis without current pathological fracture: Secondary | ICD-10-CM | POA: Insufficient documentation

## 2017-01-09 NOTE — Progress Notes (Deleted)
Subjective:   Hannah Scott is a 66 y.o. female who presents for an Initial Medicare Annual Wellness Visit.  Review of Systems    No ROS.  Medicare Wellness Visit. Additional risk factors are reflected in the social history.     Sleep patterns: {SX; SLEEP PATTERNS:18802::"feels rested on waking","does not get up to void","gets up *** times nightly to void","sleeps *** hours nightly"}.    Home Safety/Smoke Alarms: Feels safe in home. Smoke alarms in place.  Living environment; residence and Firearm Safety: {Rehab home environment / accessibility:30080::"no firearms","firearms stored safely"}. Seat Belt Safety/Bike Helmet: Wears seat belt.     Objective:    There were no vitals filed for this visit. There is no height or weight on file to calculate BMI.   Current Medications (verified) Outpatient Encounter Prescriptions as of 01/10/2017  Medication Sig  . albuterol (PROVENTIL HFA;VENTOLIN HFA) 108 (90 Base) MCG/ACT inhaler Inhale 2 puffs into the lungs every 4 (four) hours as needed for wheezing or shortness of breath.  [name] atorvastatin (LIPITOR) 20 MG tablet TAKE ONE TABLET EVERY DAY  . benazepril (LOTENSIN) 40 MG tablet TAKE 1/2 TABLET EVERY DAY  . DULoxetine (CYMBALTA) 30 MG capsule TAKE ONE CAPSULE BY MOUTH EVERY DAY  . gabapentin (NEURONTIN) 400 MG capsule Take 1 capsule (400 mg total) by mouth at bedtime.  [name] HYDROcodone-acetaminophen (NORCO) 10-325 MG tablet Take 1 tablet by mouth every 8 (eight) hours as needed.  [name] ipratropium-albuterol (DUONEB) 0.5-2.5 (3) MG/3ML SOLN Take 3 mLs by nebulization every 4 (four) hours as needed (wheezing or SOB). During exacerbation  . levothyroxine (SYNTHROID, LEVOTHROID) 75 MCG tablet TAKE ONE TABLET BY MOUTH DAILY BEFORE BREAKFAST  . metoprolol tartrate (LOPRESSOR) 25 MG tablet Take 25 mg by mouth 2 (two) times daily.   No facility-administered encounter medications on file as of 01/10/2017.     Allergies (verified) Fluticasone-salmeterol;  Norvasc [amlodipine besylate]; and Tape   History: Past Medical History:  Diagnosis Date  . Anemia    hx  . Arthritis   . Asthma    PFTs, February, 2011, moderate obstructive disease with response to bronchodilators, normal lung volumes, moderate reduction in diffusing capacity  . Atrial septal aneurysm    Echo, 2008-not noted on 13 echo  . CAD (coronary artery disease)    90% distal LAD in the past  /   nuclear, 2008, no ischemia, ejection fraction 70%  . Cancer ([place]) 2002   DUCTAL CIS--S/P LUMPECTOMY, RADIATION AND 6 WEEKS OF TAMOXIFEN  . D-dimer, elevated    January, 2014  . Depression   . Ejection fraction    EF 60%, echo, October, 2008  . Elevated CPK    January, 2014  . Endometriosis 1989   RIGHT TUBE  . Endometriosis 1987   LEFT TUBE/OVARY W FOCAL IN-SITU ENDOMETRIAL ADENOCARCINOMA  . GERD (gastroesophageal reflux disease)    occ  . H/O hiatal hernia    ?  [name] Hyperlipidemia   . Hypertension   . Hypothyroidism    Patient has had in the past that she does not need treatment  . Kyphoscoliosis   . Obstructive airway disease ([place])   . Pinched nerve    lower back  . Shortness of breath   . UTI (lower urinary tract infection)    Past Surgical History:  Procedure Laterality Date  . ANKLE SURGERY Left    ligament  . APPENDECTOMY  1987   AT [facility]  . BACK SURGERY     Fusion  .  BREAST LUMPECTOMY  2003   radiation on right  . BREAST LUMPECTOMY WITH RADIOACTIVE SEED LOCALIZATION Left 02/12/2016   Procedure: LEFT BREAST LUMPECTOMY WITH RADIOACTIVE SEED LOCALIZATION;  Surgeon: Excell Seltzer, MD;  Location: [place];  Service: General;  Laterality: Left;  . CARDIOVASCULAR STRESS TEST  09/07/05   Nuclear, was negative  . CATARACT EXTRACTION Bilateral 01,03  . OOPHORECTOMY     LSO -RSO  . PELVIC LAPAROSCOPY  1989   RSO, LYSIS OF ADHESIONS  . TOTAL ABDOMINAL HYSTERECTOMY  1987   LSO, APPENDECTOMY  . TOTAL HIP ARTHROPLASTY Right 10/28/2013   dr Lorin Mercy    . TOTAL HIP ARTHROPLASTY Right 10/28/2013   Procedure: TOTAL HIP ARTHROPLASTY ANTERIOR APPROACH;  Surgeon: Marybelle Killings, MD;  Location: [place];  Service: Orthopedics;  Laterality: Right;  Right Total Hip Arthroplasty, Direct Anterior Approach   Family History  Problem Relation Age of Onset  . Heart failure Mother   . Pneumonia Mother   . Hypertension Mother   . Heart disease Mother   . Stroke Maternal Grandfather   . Hypertension Maternal Grandfather   . Heart attack Father   . Heart disease Father   . Asthma Paternal Uncle        PAT UNCLES  . Heart attack Paternal Uncle    Social History   Occupational History  . Not on file.   Social History Main Topics  . Smoking status: Never Smoker  . Smokeless tobacco: Never Used  . Alcohol use No  . Drug use: No  . Sexual activity: No    Tobacco Counseling Counseling given: Not Answered   Activities of Daily Living In your present state of health, do you have any difficulty performing the following activities: 02/12/2016  Hearing? N  Vision? N  Difficulty concentrating or making decisions? N  Walking or climbing stairs? N  Dressing or bathing? N  Some recent data might be hidden    Immunizations and Health Maintenance Immunization History  Administered Date(s) Administered  . Influenza Split 03/23/2011, 02/15/2012  . Influenza Whole 02/12/2003, 12/24/2009  . Influenza, High Dose Seasonal PF 01/06/2016  . Influenza,inj,Quad PF,6+ Mos 01/29/2013, 01/03/2014, 02/11/2015  . Pneumococcal Conjugate-13 05/06/2015  . Pneumococcal Polysaccharide-23 07/06/2016   Health Maintenance Due  Topic Date Due  . COLONOSCOPY  04/09/2014  . TETANUS/TDAP  11/14/2016  . INFLUENZA VACCINE  11/23/2016    Patient Care Team: Binnie Rail, MD as PCP - General (Internal Medicine)  Indicate any recent Medical Services you may have received from other than [facility] providers in the past year (date may be approximate).     Assessment:   This  is a routine wellness examination for Hannah Scott. Physical assessment deferred to PCP.   Hearing/Vision screen No exam data present  Dietary issues and exercise activities discussed:   Diet (meal preparation, eat out, water intake, caffeinated beverages, dairy products, fruits and vegetables): {Desc; diets:16563}   Goals    None     Depression Screen PHQ 2/9 Scores 01/02/2017 09/08/2016 01/06/2016 11/18/2014 07/11/2014  PHQ - 2 Score 1 1 1  - 5  PHQ- 9 Score - - - - 11  Exception Documentation - - - Other- indicate reason in comment box -  Not completed - - - already taken -    Fall Risk Fall Risk  01/02/2017 09/08/2016 08/02/2016 07/06/2016 05/17/2016  Falls in the past year? No No No No No  Risk for fall due to : - - - - -  Cognitive Function:        Screening Tests Health Maintenance  Topic Date Due  . COLONOSCOPY  04/09/2014  . TETANUS/TDAP  11/14/2016  . INFLUENZA VACCINE  11/23/2016  . MAMMOGRAM  12/22/2017  . DEXA SCAN  12/23/2018  . Hepatitis C Screening  Completed  . PNA vac Low Risk Adult  Completed      Plan:    I have personally reviewed and noted the following in the patient's chart:   . Medical and social history . Use of alcohol, tobacco or illicit drugs  . Current medications and supplements . Functional ability and status . Nutritional status . Physical activity . Advanced directives . List of other physicians . Vitals . Screenings to include cognitive, depression, and falls . Referrals and appointments  In addition, I have reviewed and discussed with patient certain preventive protocols, quality metrics, and best practice recommendations. A written personalized care plan for preventive services as well as general preventive health recommendations were provided to patient.     Michiel Cowboy, RN   01/09/2017

## 2017-01-09 NOTE — Progress Notes (Deleted)
Pre visit review using our clinic review tool, if applicable. No additional management support is needed unless otherwise documented below in the visit note. 

## 2017-01-10 ENCOUNTER — Ambulatory Visit (INDEPENDENT_AMBULATORY_CARE_PROVIDER_SITE_OTHER): Payer: Medicare Other | Admitting: Internal Medicine

## 2017-01-10 ENCOUNTER — Ambulatory Visit: Payer: Medicare Other

## 2017-01-10 ENCOUNTER — Encounter: Payer: Self-pay | Admitting: Internal Medicine

## 2017-01-10 ENCOUNTER — Other Ambulatory Visit (INDEPENDENT_AMBULATORY_CARE_PROVIDER_SITE_OTHER): Payer: Medicare Other

## 2017-01-10 VITALS — BP 138/82 | HR 80 | Temp 98.4°F | Resp 16 | Wt 156.0 lb

## 2017-01-10 DIAGNOSIS — F3289 Other specified depressive episodes: Secondary | ICD-10-CM

## 2017-01-10 DIAGNOSIS — E039 Hypothyroidism, unspecified: Secondary | ICD-10-CM

## 2017-01-10 DIAGNOSIS — R7303 Prediabetes: Secondary | ICD-10-CM | POA: Diagnosis not present

## 2017-01-10 DIAGNOSIS — M810 Age-related osteoporosis without current pathological fracture: Secondary | ICD-10-CM

## 2017-01-10 DIAGNOSIS — Z23 Encounter for immunization: Secondary | ICD-10-CM | POA: Diagnosis not present

## 2017-01-10 DIAGNOSIS — Z0000 Encounter for general adult medical examination without abnormal findings: Secondary | ICD-10-CM

## 2017-01-10 DIAGNOSIS — E7800 Pure hypercholesterolemia, unspecified: Secondary | ICD-10-CM

## 2017-01-10 DIAGNOSIS — J4530 Mild persistent asthma, uncomplicated: Secondary | ICD-10-CM | POA: Diagnosis not present

## 2017-01-10 DIAGNOSIS — I10 Essential (primary) hypertension: Secondary | ICD-10-CM | POA: Diagnosis not present

## 2017-01-10 LAB — COMPREHENSIVE METABOLIC PANEL
ALT: 8 U/L (ref 0–35)
AST: 16 U/L (ref 0–37)
Albumin: 3.7 g/dL (ref 3.5–5.2)
Alkaline Phosphatase: 75 U/L (ref 39–117)
BILIRUBIN TOTAL: 0.3 mg/dL (ref 0.2–1.2)
BUN: 14 mg/dL (ref 6–23)
CO2: 35 meq/L — AB (ref 19–32)
Calcium: 9.2 mg/dL (ref 8.4–10.5)
Chloride: 102 mEq/L (ref 96–112)
Creatinine, Ser: 0.59 mg/dL (ref 0.40–1.20)
GFR: 107.92 mL/min (ref >60.00)
GLUCOSE: 100 mg/dL — AB (ref 70–99)
Potassium: 4.6 mEq/L (ref 3.5–5.1)
SODIUM: 141 meq/L (ref 135–145)
Total Protein: 5.8 g/dL — ABNORMAL LOW (ref 6.0–8.3)

## 2017-01-10 LAB — VITAMIN D 25 HYDROXY (VIT D DEFICIENCY, FRACTURES): VITD: 10 ng/mL — ABNORMAL LOW (ref 30.00–100.00)

## 2017-01-10 LAB — TSH: TSH: 0.12 u[IU]/mL — AB (ref 0.35–4.50)

## 2017-01-10 LAB — HEMOGLOBIN A1C: Hgb A1c MFr Bld: 5.7 % (ref 4.6–6.5)

## 2017-01-10 MED ORDER — DULOXETINE HCL 60 MG PO CPEP: 60.0000 mg | ORAL_CAPSULE | Freq: Every day | ORAL | 3 refills | Status: DC

## 2017-01-10 MED ORDER — FLUTICASONE FUROATE-VILANTEROL 100-25 MCG/INH IN AEPB: 1.0000 | INHALATION_SPRAY | Freq: Every day | RESPIRATORY_TRACT | Status: DC

## 2017-01-10 NOTE — Patient Instructions (Addendum)
  Test(s) ordered today. Your results will be released to [place] (or called to you) after review, usually within 72hours after test completion. If any changes need to be made, you will be notified at that same time.  All other Health Maintenance issues reviewed.   All recommended immunizations and age-appropriate screenings are up-to-date or discussed.  Flu immunization administered today.   Medications reviewed and updated.  No changes recommended at this time.    Please followup in 6 months  Continue doing brain stimulating activities (puzzles, reading, adult coloring books, staying active) to keep memory sharp.   Continue to eat heart healthy diet (full of fruits, vegetables, whole grains, lean protein, water--limit salt, fat, and sugar intake) and increase physical activity as tolerated.   Hannah Scott , Thank you for taking time to come for your Medicare Wellness Visit. I appreciate your ongoing commitment to your health goals. Please review the following plan we discussed and let me know if I can assist you in the future.   These are the goals we discussed: Goals    . Stay as healthy and as independent as possible       This is a list of the screening recommended for you and due dates:  Health Maintenance  Topic Date Due  . Colon Cancer Screening  04/09/2014  . Tetanus Vaccine  11/14/2016  . Mammogram  12/22/2017  . DEXA scan (bone density measurement)  12/23/2018  . Flu Shot  Completed  .  Hepatitis C: One time screening is recommended by Center for Disease Control  (CDC) for  adults born from [birth year] through 1966.   Completed  . Pneumonia vaccines  Completed

## 2017-01-10 NOTE — Progress Notes (Signed)
Subjective:    Patient ID: Hannah Scott, female    DOB: 1950/11/29, 66 y.o.   MRN: [id number]  HPI The patient is here for follow up.  Osteoporosis: she is not able to exercise and is not currently taking calcium or vitamin D.  She just had her dexa done.   Hypertension: She is taking her medication daily. She is compliant with a low sodium diet.  She denies chest pain, frequent palpitations, edema, and regular headaches. She is not exercising regularly.  She does not monitor her blood pressure at home.    Hyperlipidemia: She is taking her medication daily. She is compliant with a low fat/cholesterol diet. She is not exercising regularly. She denies myalgias.   Hypothyroidism:  She is taking her medication daily.  She has had increased fatigue.   She would like her thyroid checked.   Depression: She is taking her medication daily as prescribed. She denies any side effects from the medication. She feels her depression is not well controlled.  She wonders about increasing the dose.      Medications and allergies reviewed with patient and updated if appropriate.  Patient Active Problem List   Diagnosis Date Noted  . Osteoporosis 01/07/2017  . Bronchitis, acute 10/17/2016  . Prediabetes 07/07/2016  . History of breast cancer 01/06/2016  . Intraductal papilloma of left breast 01/06/2016  . Essential hypertension 06/08/2015  . DOE (dyspnea on exertion) 06/08/2015  . Fatigue 05/20/2015  . Back pain 04/22/2015  . Lumbar scoliosis 01/10/2014  . Anemia due to blood loss 11/27/2013  . Elevated CPK   . Multinodular goiter 12/01/2011  . Asthma   . Hypothyroidism   . CAD (coronary artery disease)   . Atrial septal aneurysm   . Hyperlipidemia   . Scoliosis   . Spinal stenosis, lumbar region, with neurogenic claudication 07/01/2011  . Lumbar postlaminectomy syndrome 07/01/2011  . Osteoarthritis of right hip 07/01/2011  . Contracture of right hip 07/01/2011  . Endometriosis   .  Depression 12/24/2009  . Migraine headache 05/19/2006    Current Outpatient Prescriptions on File Prior to Visit  Medication Sig Dispense Refill  . albuterol (PROVENTIL HFA;VENTOLIN HFA) 108 (90 Base) MCG/ACT inhaler Inhale 2 puffs into the lungs every 4 (four) hours as needed for wheezing or shortness of breath. 1 Inhaler 0  . atorvastatin (LIPITOR) 20 MG tablet TAKE ONE TABLET EVERY DAY 90 tablet 1  . benazepril (LOTENSIN) 40 MG tablet TAKE 1/2 TABLET EVERY DAY 45 tablet 1  . DULoxetine (CYMBALTA) 30 MG capsule TAKE ONE CAPSULE BY MOUTH EVERY DAY 30 capsule 0  . gabapentin (NEURONTIN) 400 MG capsule Take 1 capsule (400 mg total) by mouth at bedtime. 30 capsule 5  . HYDROcodone-acetaminophen (NORCO) 10-325 MG tablet Take 1 tablet by mouth every 8 (eight) hours as needed. 90 tablet 0  . ipratropium-albuterol (DUONEB) 0.5-2.5 (3) MG/3ML SOLN Take 3 mLs by nebulization every 4 (four) hours as needed (wheezing or SOB). During exacerbation 360 mL 0  . levothyroxine (SYNTHROID, LEVOTHROID) 75 MCG tablet TAKE ONE TABLET BY MOUTH DAILY BEFORE BREAKFAST 30 tablet 2  . metoprolol tartrate (LOPRESSOR) 25 MG tablet Take 25 mg by mouth 2 (two) times daily.     No current facility-administered medications on file prior to visit.     Past Medical History:  Diagnosis Date  . Anemia    hx  . Arthritis   . Asthma    PFTs, February, 2011, moderate obstructive disease with response  to bronchodilators, normal lung volumes, moderate reduction in diffusing capacity  . Atrial septal aneurysm    Echo, 2008-not noted on 13 echo  . CAD (coronary artery disease)    90% distal LAD in the past  /   nuclear, 2008, no ischemia, ejection fraction 70%  . Cancer ([place]) 2002   DUCTAL CIS--S/P LUMPECTOMY, RADIATION AND 6 WEEKS OF TAMOXIFEN  . D-dimer, elevated    January, 2014  . Depression   . Ejection fraction    EF 60%, echo, October, 2008  . Elevated CPK    January, 2014  . Endometriosis 1989   RIGHT TUBE  .  Endometriosis 1987   LEFT TUBE/OVARY W FOCAL IN-SITU ENDOMETRIAL ADENOCARCINOMA  . GERD (gastroesophageal reflux disease)    occ  . H/O hiatal hernia    ?  [name] Hyperlipidemia   . Hypertension   . Hypothyroidism    Patient has had in the past that she does not need treatment  . Kyphoscoliosis   . Obstructive airway disease ([place])   . Pinched nerve    lower back  . Shortness of breath   . UTI (lower urinary tract infection)     Past Surgical History:  Procedure Laterality Date  . ANKLE SURGERY Left    ligament  . APPENDECTOMY  1987   AT [facility]  . BACK SURGERY     Fusion  . BREAST LUMPECTOMY  2003   radiation on right  . BREAST LUMPECTOMY WITH RADIOACTIVE SEED LOCALIZATION Left 02/12/2016   Procedure: LEFT BREAST LUMPECTOMY WITH RADIOACTIVE SEED LOCALIZATION;  Surgeon: Excell Seltzer, MD;  Location: [place];  Service: General;  Laterality: Left;  . CARDIOVASCULAR STRESS TEST  09/07/05   Nuclear, was negative  . CATARACT EXTRACTION Bilateral 01,03  . OOPHORECTOMY     [facility] -RSO  . PELVIC LAPAROSCOPY  1989   RSO, LYSIS OF ADHESIONS  . TOTAL ABDOMINAL HYSTERECTOMY  1987   LS[facility], APPENDECTOMY  . TOTAL HIP ARTHROPLASTY Right 10/28/2013   dr Lorin Mercy  . TOTAL HIP ARTHROPLASTY Right 10/28/2013   Procedure: TOTAL HIP ARTHROPLASTY ANTERIOR APPROACH;  Surgeon: Marybelle Killings, MD;  Location: [place];  Service: Orthopedics;  Laterality: Right;  Right Total Hip Arthroplasty, Direct Anterior Approach    Social History   Social History  . Marital status: Married    Spouse name: N/A  . Number of children: N/A  . Years of education: N/A   Social History Main Topics  . Smoking status: Never Smoker  . Smokeless tobacco: Never Used  . Alcohol use No  . Drug use: No  . Sexual activity: No   Other Topics Concern  . Not on file   Social History Narrative  . No narrative on file    Family History  Problem Relation Age of Onset  . Heart failure Mother   . Pneumonia Mother     . Hypertension Mother   . Heart disease Mother   . Stroke Maternal Grandfather   . Hypertension Maternal Grandfather   . Heart attack Father   . Heart disease Father   . Asthma Paternal Uncle        PAT UNCLES  . Heart attack Paternal Uncle     Review of Systems  Constitutional: Negative for chills and fever.  Respiratory: Positive for shortness of breath (minimal). Negative for cough and wheezing.   Cardiovascular: Positive for palpitations (chronic, occasional). Negative for chest pain and leg swelling.  Neurological: Positive for headaches (occasinal, not  new). Negative for light-headedness.       Objective:   Vitals:   01/10/17 1415  BP: 138/82  Pulse: 80  Resp: 16  Temp: 98.4 F (36.9 C)  SpO2: 94%   Wt Readings from Last 3 Encounters:  01/10/17 156 lb (70.8 kg)  11/09/16 154 lb (69.9 kg)  10/17/16 153 lb (69.4 kg)   Body mass index is 30.47 kg/m.   Physical Exam    Constitutional: Appears well-developed and well-nourished. No distress.  HENT:  Head: Normocephalic and atraumatic.  Neck: Neck supple. No tracheal deviation present. No thyromegaly present.  No cervical lymphadenopathy Cardiovascular: Normal rate, regular rhythm and normal heart sounds.   No murmur heard. No carotid bruit .  No edema Pulmonary/Chest: Effort normal . No respiratory distress. occasional wheeze. No rales.  Skin: Skin is warm and dry. Not diaphoretic.  Psychiatric: Normal mood and affect. Behavior is normal.      Assessment & Plan:    See Problem List for Assessment and Plan of chronic medical problems.

## 2017-01-10 NOTE — Assessment & Plan Note (Signed)
Continue statin. 

## 2017-01-10 NOTE — Progress Notes (Signed)
Pre visit review using our clinic review tool, if applicable. No additional management support is needed unless otherwise documented below in the visit note. 

## 2017-01-10 NOTE — Assessment & Plan Note (Signed)
Check a1c Low sugar / carb diet   

## 2017-01-10 NOTE — Assessment & Plan Note (Signed)
Not controlled Increase cymbalta to 60 mg daily

## 2017-01-10 NOTE — Assessment & Plan Note (Signed)
Discussed OP medications - she declined now, but will think about it Check vitamin D level Not sure if she can take calcium

## 2017-01-10 NOTE — Assessment & Plan Note (Signed)
Check tsh  Titrate med dose if needed  

## 2017-01-10 NOTE — Assessment & Plan Note (Signed)
BP well controlled Current regimen effective and well tolerated Continue current medications at current doses cmp  

## 2017-01-10 NOTE — Assessment & Plan Note (Addendum)
Must stay on dulera or breo  - when she tries to go off she starts wheezing Both are very expensive - she can not afford them - will give a sample today No active wheeze or cough

## 2017-01-10 NOTE — Progress Notes (Addendum)
Subjective:   Hannah Scott is a 66 y.o. female who presents for an Initial Medicare Annual Wellness Visit.  Review of Systems    No ROS.  Medicare Wellness Visit. Additional risk factors are reflected in the social history.   Cardiac Risk Factors include: advanced age (>21men, >38 women);hypertension Sleep patterns: has frequent nighttime awakenings, gets up 1-2 times nightly to void and sleeps 5-7 hours nightly. Patient reports insomnia issues, discussed recommended sleep tips and stress reduction tips.   Home Safety/Smoke Alarms: Feels safe in home. Smoke alarms in place.  Living environment; residence and Firearm Safety: 1-story house/ trailer, equipment: Radio producer, Type: [place], Walkers, Type: Conservation officer, nature and [employer], Type: Tub Surveyor, quantity, no firearms. Lives alone, no needs for DME, good support system Seat Belt Safety/Bike Helmet: Wears seat belt.     Objective:    Today's Vitals   01/10/17 1415 01/10/17 1527  BP: 138/82   Pulse: 80   Resp: 16   Temp: 98.4 F (36.9 C)   TempSrc: Oral   SpO2: 94%   Weight: 156 lb (70.8 kg)   PainSc:  6    Body mass index is 30.47 kg/m.   Current Medications (verified) Outpatient Encounter Prescriptions as of 01/10/2017  Medication Sig  . albuterol (PROVENTIL HFA;VENTOLIN HFA) 108 (90 Base) MCG/ACT inhaler Inhale 2 puffs into the lungs every 4 (four) hours as needed for wheezing or shortness of breath.  [name] atorvastatin (LIPITOR) 20 MG tablet TAKE ONE TABLET EVERY DAY  . benazepril (LOTENSIN) 40 MG tablet TAKE 1/2 TABLET EVERY DAY  . gabapentin (NEURONTIN) 400 MG capsule Take 1 capsule (400 mg total) by mouth at bedtime.  [name] HYDROcodone-acetaminophen (NORCO) 10-325 MG tablet Take 1 tablet by mouth every 8 (eight) hours as needed.  [name] ipratropium-albuterol (DUONEB) 0.5-2.5 (3) MG/3ML SOLN Take 3 mLs by nebulization every 4 (four) hours as needed (wheezing or SOB). During exacerbation  . levothyroxine (SYNTHROID, LEVOTHROID)  75 MCG tablet TAKE ONE TABLET BY MOUTH DAILY BEFORE BREAKFAST  . metoprolol tartrate (LOPRESSOR) 25 MG tablet Take 25 mg by mouth 2 (two) times daily.  . [DISCONTINUED] DULoxetine (CYMBALTA) 30 MG capsule TAKE ONE CAPSULE BY MOUTH EVERY DAY  . DULoxetine (CYMBALTA) 60 MG capsule Take 1 capsule (60 mg total) by mouth daily.  . fluticasone furoate-vilanterol (BREO ELLIPTA) 100-25 MCG/INH AEPB Inhale 1 puff into the lungs daily.   No facility-administered encounter medications on file as of 01/10/2017.     Allergies (verified) Fluticasone-salmeterol; Norvasc [amlodipine besylate]; and Tape   History: Past Medical History:  Diagnosis Date  . Anemia    hx  . Arthritis   . Asthma    PFTs, February, 2011, moderate obstructive disease with response to bronchodilators, normal lung volumes, moderate reduction in diffusing capacity  . Atrial septal aneurysm    Echo, 2008-not noted on 13 echo  . CAD (coronary artery disease)    90% distal LAD in the past  /   nuclear, 2008, no ischemia, ejection fraction 70%  . Cancer ([place]) 2002   DUCTAL CIS--S/P LUMPECTOMY, RADIATION AND 6 WEEKS OF TAMOXIFEN  . D-dimer, elevated    January, 2014  . Depression   . Ejection fraction    EF 60%, echo, October, 2008  . Elevated CPK    January, 2014  . Endometriosis 1989   RIGHT TUBE  . Endometriosis 1987   LEFT TUBE/OVARY W FOCAL IN-SITU ENDOMETRIAL ADENOCARCINOMA  . GERD (gastroesophageal reflux disease)    occ  .  H/O hiatal hernia    ?  [name] Hyperlipidemia   . Hypertension   . Hypothyroidism    Patient has had in the past that she does not need treatment  . Kyphoscoliosis   . Obstructive airway disease ([place])   . Pinched nerve    lower back  . Shortness of breath   . UTI (lower urinary tract infection)    Past Surgical History:  Procedure Laterality Date  . ANKLE SURGERY Left    ligament  . APPENDECTOMY  1987   AT [facility]  . BACK SURGERY     Fusion  . BREAST LUMPECTOMY  2003   radiation on right   . BREAST LUMPECTOMY WITH RADIOACTIVE SEED LOCALIZATION Left 02/12/2016   Procedure: LEFT BREAST LUMPECTOMY WITH RADIOACTIVE SEED LOCALIZATION;  Surgeon: Excell Seltzer, MD;  Location: [facility];  Service: General;  Laterality: Left;  . CARDIOVASCULAR STRESS TEST  09/07/05   Nuclear, was negative  . CATARACT EXTRACTION Bilateral 01,03  . OOPHORECTOMY     LSO -RSO  . PELVIC LAPAROSCOPY  1989   RSO, LYSIS OF ADHESIONS  . TOTAL ABDOMINAL HYSTERECTOMY  1987   LSO, APPENDECTOMY  . TOTAL HIP ARTHROPLASTY Right 10/28/2013   dr Lorin Mercy  . TOTAL HIP ARTHROPLASTY Right 10/28/2013   Procedure: TOTAL HIP ARTHROPLASTY ANTERIOR APPROACH;  Surgeon: Marybelle Killings, MD;  Location: [facility];  Service: Orthopedics;  Laterality: Right;  Right Total Hip Arthroplasty, Direct Anterior Approach   Family History  Problem Relation Age of Onset  . Heart failure Mother   . Pneumonia Mother   . Hypertension Mother   . Heart disease Mother   . Stroke Maternal Grandfather   . Hypertension Maternal Grandfather   . Heart attack Father   . Heart disease Father   . Asthma Paternal Uncle        PAT UNCLES  . Heart attack Paternal Uncle    Social History   Occupational History  . Not on file.   Social History Main Topics  . Smoking status: Never Smoker  . Smokeless tobacco: Never Used  . Alcohol use No  . Drug use: No  . Sexual activity: No    Tobacco Counseling Counseling given: Not Answered   Activities of Daily Living In your present state of health, do you have any difficulty performing the following activities: 01/10/2017 02/12/2016  Hearing? N N  Vision? N N  Difficulty concentrating or making decisions? N N  Walking or climbing stairs? N N  Dressing or bathing? N N  Doing errands, shopping? N -  Preparing Food and eating ? N -  Using the Toilet? N -  In the past six months, have you accidently leaked urine? N -  Do you have problems with loss of bowel control? N -  Managing your  Medications? N -  Managing your Finances? N -  Housekeeping or managing your Housekeeping? N -  Some recent data might be hidden    Immunizations and Health Maintenance Immunization History  Administered Date(s) Administered  . Influenza Split 03/23/2011, 02/15/2012  . Influenza Whole 02/12/2003, 12/24/2009  . Influenza, High Dose Seasonal PF 01/06/2016, 01/10/2017  . Influenza,inj,Quad PF,6+ Mos 01/29/2013, 01/03/2014, 02/11/2015  . Pneumococcal Conjugate-13 05/06/2015  . Pneumococcal Polysaccharide-23 07/06/2016   Health Maintenance Due  Topic Date Due  . COLONOSCOPY  04/09/2014  . TETANUS/TDAP  11/14/2016    Patient Care Team: Binnie Rail, MD as PCP - General (Internal Medicine)  Indicate any recent  Medical Services you may have received from other than [facility] providers in the past year (date may be approximate).     Assessment:   This is a routine wellness examination for Dharma. Physical assessment deferred to PCP.   Hearing/Vision screen  Visual Acuity Screening   Right eye Left eye Both eyes  Without correction:   20/32  With correction:     Comments: appointment yearly, has an upcoming appointment next week.  Hearing Screening Comments: Able to hear conversational tones w/o difficulty. No issues reported.    Dietary issues and exercise activities discussed: Current Exercise Habits: Home exercise routine, Type of exercise: walking;calisthenics, Time (Minutes): 20, Frequency (Times/Week): 4, Weekly Exercise (Minutes/Week): 80, Intensity: Mild, Exercise limited by: orthopedic condition(s)  Diet (meal preparation, eat out, water intake, caffeinated beverages, dairy products, fruits and vegetables): in general, a "healthy" diet  , well balanced   Reviewed heart healthy, encouraged patient to increase daily water intake.   Goals    . Stay as healthy and as independent as possible      Depression Screen PHQ 2/9 Scores 01/10/2017 01/02/2017 09/08/2016 01/06/2016  11/18/2014 07/11/2014  PHQ - 2 Score 2 1 1 1  - 5  PHQ- 9 Score 5 - - - - 11  Exception Documentation - - - - Other- indicate reason in comment box -  Not completed - - - - already taken -    Fall Risk Fall Risk  01/10/2017 01/02/2017 09/08/2016 08/02/2016 07/06/2016  Falls in the past year? No No No No No  Risk for fall due to : - - - - -    Cognitive Function: MMSE - Mini Mental State Exam 01/10/2017  Orientation to time 5  Orientation to Place 5  Registration 3  Attention/ Calculation 5  Recall 2  Language- name 2 objects 2  Language- repeat 1  Language- follow 3 step command 3  Language- read & follow direction 1  Write a sentence 1  Copy design 1  Total score 29        Screening Tests Health Maintenance  Topic Date Due  . COLONOSCOPY  04/09/2014  . TETANUS/TDAP  11/14/2016  . MAMMOGRAM  12/22/2017  . DEXA SCAN  12/23/2018  . INFLUENZA VACCINE  Completed  . Hepatitis C Screening  Completed  . PNA vac Low Risk Adult  Completed      Plan:    Continue doing brain stimulating activities (puzzles, reading, adult coloring books, staying active) to keep memory sharp.   Continue to eat heart healthy diet (full of fruits, vegetables, whole grains, lean protein, water--limit salt, fat, and sugar intake) and increase physical activity as tolerated.  I have personally reviewed and noted the following in the patient's chart:   . Medical and social history . Use of alcohol, tobacco or illicit drugs  . Current medications and supplements . Functional ability and status . Nutritional status . Physical activity . Advanced directives . List of other physicians . Vitals . Screenings to include cognitive, depression, and falls . Referrals and appointments  In addition, I have reviewed and discussed with patient certain preventive protocols, quality metrics, and best practice recommendations. A written personalized care plan for preventive services as well as general preventive  health recommendations were provided to patient.     Michiel Cowboy, RN   01/10/2017    Medical screening examination/treatment/procedure(s) were performed by non-physician practitioner and as supervising physician I was immediately available for consultation/collaboration. I agree with above. Claudina Lick  Quay Burow, MD

## 2017-01-17 ENCOUNTER — Telehealth: Payer: Self-pay | Admitting: Internal Medicine

## 2017-01-17 DIAGNOSIS — E039 Hypothyroidism, unspecified: Secondary | ICD-10-CM

## 2017-01-17 MED ORDER — VITAMIN D3 1.25 MG (50000 UT) PO TABS: 50000.0000 [IU] | ORAL_TABLET | ORAL | 2 refills | Status: DC

## 2017-01-17 MED ORDER — LEVOTHYROXINE SODIUM 50 MCG PO TABS: 50.0000 ug | ORAL_TABLET | Freq: Every day | ORAL | 0 refills | Status: DC

## 2017-01-17 NOTE — Telephone Encounter (Signed)
Noted, medications sent and labs ordered.

## 2017-01-17 NOTE — Telephone Encounter (Signed)
Pt given lab results from 9/18, would like Vit D and Synthroid sent to [facility], [place] verbalized understanding and had no questions Needs TSH blood worked entered was told to come in November for blood work.

## 2017-01-22 ENCOUNTER — Telehealth: Payer: Self-pay | Admitting: Internal Medicine

## 2017-01-25 ENCOUNTER — Other Ambulatory Visit: Payer: Self-pay

## 2017-01-25 MED ORDER — GABAPENTIN 400 MG PO CAPS: 400.0000 mg | ORAL_CAPSULE | Freq: Every day | ORAL | 5 refills | Status: DC

## 2017-01-25 NOTE — Telephone Encounter (Signed)
Patient returning [facility] call.

## 2017-01-27 ENCOUNTER — Telehealth: Payer: Self-pay | Admitting: Registered Nurse

## 2017-01-27 ENCOUNTER — Encounter: Payer: Medicare Other | Attending: Physical Medicine & Rehabilitation | Admitting: Registered Nurse

## 2017-01-27 ENCOUNTER — Encounter: Payer: Self-pay | Admitting: Registered Nurse

## 2017-01-27 VITALS — BP 116/75 | HR 71

## 2017-01-27 DIAGNOSIS — M1611 Unilateral primary osteoarthritis, right hip: Secondary | ICD-10-CM | POA: Diagnosis not present

## 2017-01-27 DIAGNOSIS — M48061 Spinal stenosis, lumbar region without neurogenic claudication: Secondary | ICD-10-CM | POA: Insufficient documentation

## 2017-01-27 DIAGNOSIS — G894 Chronic pain syndrome: Secondary | ICD-10-CM | POA: Diagnosis not present

## 2017-01-27 DIAGNOSIS — M4126 Other idiopathic scoliosis, lumbar region: Secondary | ICD-10-CM | POA: Diagnosis not present

## 2017-01-27 DIAGNOSIS — M419 Scoliosis, unspecified: Secondary | ICD-10-CM | POA: Diagnosis not present

## 2017-01-27 DIAGNOSIS — Z5181 Encounter for therapeutic drug level monitoring: Secondary | ICD-10-CM

## 2017-01-27 DIAGNOSIS — M24551 Contracture, right hip: Secondary | ICD-10-CM | POA: Diagnosis not present

## 2017-01-27 DIAGNOSIS — Z79899 Other long term (current) drug therapy: Secondary | ICD-10-CM

## 2017-01-27 DIAGNOSIS — M961 Postlaminectomy syndrome, not elsewhere classified: Secondary | ICD-10-CM | POA: Insufficient documentation

## 2017-01-27 MED ORDER — HYDROCODONE-ACETAMINOPHEN 10-325 MG PO TABS: 1.0000 | ORAL_TABLET | Freq: Three times a day (TID) | ORAL | 0 refills | Status: DC | PRN

## 2017-01-27 NOTE — Telephone Encounter (Signed)
On 01/27/2017 the [place] was reviewed no conflict was seen on the [place] with multiple prescribers. [name] has a signed narcotic contract with our office. If there were any discrepancies this would have been reported to her physician.

## 2017-01-27 NOTE — Progress Notes (Signed)
Subjective:    Patient ID: Hannah Scott, female    DOB: Jan 15, 1951, 66 y.o.   MRN: [id number]  HPI: [name] is a 66 year old female who returns for follow up appointmentfor chronic pain and medication refill. She states her pain is located in her lower back mainly right side She rates her pain 7. Her current exercise regime is walking for short distances using her cadillac walker or cane in her home and using her stationary bicycle weekly for 5-10 minutes.  [name] Morphine equivalent is 30 MME.  Her last UDS was on 08/02/2016  it was consistent.     Pain Inventory Average Pain 8 Pain Right Now 7 My pain is constant, burning, tingling and aching  In the last 24 hours, has pain interfered with the following? General activity 10 Relation with others 10 Enjoyment of life 10 What TIME of day is your pain at its worst? daytime evening and night Sleep (in general) Poor  Pain is worse with: walking and standing Pain improves with: rest and medication Relief from Meds: 6  Mobility walk with assistance use a cane use a walker ability to climb steps?  yes do you drive?  yes  Function disabled: date disabled . I need assistance with the following:  household duties  Neuro/Psych weakness numbness tingling trouble walking depression  Prior Studies Any changes since last visit?  no  Physicians involved in your care Any changes since last visit?  no   Family History  Problem Relation Age of Onset  . Heart failure Mother   . Pneumonia Mother   . Hypertension Mother   . Heart disease Mother   . Stroke Maternal Grandfather   . Hypertension Maternal Grandfather   . Heart attack Father   . Heart disease Father   . Asthma Paternal Uncle        PAT UNCLES  . Heart attack Paternal Uncle    Social History   Social History  . Marital status: Married    Spouse name: N/A  . Number of children: N/A  . Years of education: N/A   Social History Main Topics  .  Smoking status: Never Smoker  . Smokeless tobacco: Never Used  . Alcohol use No  . Drug use: No  . Sexual activity: No   Other Topics Concern  . Not on file   Social History Narrative  . No narrative on file   Past Surgical History:  Procedure Laterality Date  . ANKLE SURGERY Left    ligament  . APPENDECTOMY  1987   AT [facility]  . BACK SURGERY     Fusion  . BREAST LUMPECTOMY  2003   radiation on right  . BREAST LUMPECTOMY WITH RADIOACTIVE SEED LOCALIZATION Left 02/12/2016   Procedure: LEFT BREAST LUMPECTOMY WITH RADIOACTIVE SEED LOCALIZATION;  Surgeon: Excell Seltzer, MD;  Location: [facility];  Service: General;  Laterality: Left;  . CARDIOVASCULAR STRESS TEST  09/07/05   Nuclear, was negative  . CATARACT EXTRACTION Bilateral 01,03  . OOPHORECTOMY     LSO -RSO  . PELVIC LAPAROSCOPY  1989   RSO, LYSIS OF ADHESIONS  . TOTAL ABDOMINAL HYSTERECTOMY  1987   LSO, APPENDECTOMY  . TOTAL HIP ARTHROPLASTY Right 10/28/2013   dr Lorin Mercy  . TOTAL HIP ARTHROPLASTY Right 10/28/2013   Procedure: TOTAL HIP ARTHROPLASTY ANTERIOR APPROACH;  Surgeon: Marybelle Killings, MD;  Location: [facility];  Service: Orthopedics;  Laterality: Right;  Right Total Hip  Arthroplasty, Direct Anterior Approach   Past Medical History:  Diagnosis Date  . Anemia    hx  . Arthritis   . Asthma    PFTs, February, 2011, moderate obstructive disease with response to bronchodilators, normal lung volumes, moderate reduction in diffusing capacity  . Atrial septal aneurysm    Echo, 2008-not noted on 13 echo  . CAD (coronary artery disease)    90% distal LAD in the past  /   nuclear, 2008, no ischemia, ejection fraction 70%  . Cancer ([place]) 2002   DUCTAL CIS--S/P LUMPECTOMY, RADIATION AND 6 WEEKS OF TAMOXIFEN  . D-dimer, elevated    January, 2014  . Depression   . Ejection fraction    EF 60%, echo, October, 2008  . Elevated CPK    January, 2014  . Endometriosis 1989   RIGHT TUBE  . Endometriosis 1987   LEFT  TUBE/OVARY W FOCAL IN-SITU ENDOMETRIAL ADENOCARCINOMA  . GERD (gastroesophageal reflux disease)    occ  . H/O hiatal hernia    ?  [name] Hyperlipidemia   . Hypertension   . Hypothyroidism    Patient has had in the past that she does not need treatment  . Kyphoscoliosis   . Obstructive airway disease ([place])   . Pinched nerve    lower back  . Shortness of breath   . UTI (lower urinary tract infection)    There were no vitals taken for this visit.  Opioid Risk Score:  0 Fall Risk Score:  `1  Depression screen PHQ 2/9  Depression screen [facility] 2/9 01/27/2017 01/10/2017 01/02/2017 09/08/2016 01/06/2016 07/11/2014  Decreased Interest 1 0 0 0 0 3  Down, Depressed, Hopeless 1 2 1 1 1 2   PHQ - 2 Score 2 2 1 1 1 5   Altered sleeping - 1 - - - 3  Tired, decreased energy - 1 - - - 3  Change in appetite - 0 - - - 0  Feeling bad or failure about yourself  - 1 - - - 0  Trouble concentrating - 0 - - - 0  Moving slowly or fidgety/restless - 0 - - - 0  Suicidal thoughts - 0 - - - 0  PHQ-9 Score - 5 - - - 11  Difficult doing work/chores - Not difficult at all - - - -  Some recent data might be hidden    Review of Systems  HENT: Negative.   Eyes: Negative.   Respiratory: Negative.   Cardiovascular: Negative.   Gastrointestinal: Negative.   Endocrine: Negative.   Genitourinary: Negative.   Musculoskeletal: Positive for gait problem.  Skin: Negative.   Allergic/Immunologic: Negative.   Neurological: Positive for weakness and numbness.       Tingling  Hematological: Negative.   Psychiatric/Behavioral: Positive for dysphoric mood.  All other systems reviewed and are negative.      Objective:   Physical Exam  Constitutional: She is oriented to person, place, and time. She appears well-developed and well-nourished.  HENT:  Head: Normocephalic and atraumatic.  Neck: Normal range of motion. Neck supple.  Cardiovascular: Normal rate and regular rhythm.   Pulmonary/Chest: Effort normal and breath  sounds normal.  Musculoskeletal:  Normal Muscle Bulk and Muscle Testing Reveals: Upper Extremities: Full ROM and Muscle Strength 5/5 Lumbar Paraspinal Tenderness: L-4-L-5 Mainly Right Side Lumbar Scoliosis Lower Extremities: Full ROM and Muscle Strength 5/5 Arises from Table Slowly using walker for support Narrow Based Gait  Neurological: She is alert and oriented to person, place, and  time.  Skin: Skin is warm and dry.  Psychiatric: She has a normal mood and affect.  Nursing note and vitals reviewed.         Assessment & Plan:  1 Lumbar post laminectomy syndrome, s/p lumbar fusion: 01/27/2017 Refilled: Hydrocodone 10/325mg  one tablet every 6 hours #100. We will continue the opioid monitoring program, this consists of regular clinic visits, examinations, urine drug screen, pill counts as well as use of [place] Controlled Substance Reporting system. 2. Right Hip OA: S/P Right Hip Replacement 10/28/2013. 01/27/2017 3.Depression: Continue Cymbalta. Has Family Support and Friends. Counseling with Doristine Bosworth. 01/27/2017 4. Right Greater Trochanteric Tenderness: No complaints today. Continue with Ice and Heat Therapy.01/27/2017  15 minutes of face to face patient care time was spent during this visit. All questions were encouraged and answered.  F/U in 1 month

## 2017-02-06 DIAGNOSIS — H524 Presbyopia: Secondary | ICD-10-CM | POA: Diagnosis not present

## 2017-02-20 ENCOUNTER — Other Ambulatory Visit: Payer: Self-pay | Admitting: Internal Medicine

## 2017-02-21 ENCOUNTER — Other Ambulatory Visit: Payer: Self-pay | Admitting: Internal Medicine

## 2017-02-23 ENCOUNTER — Encounter: Payer: Self-pay | Admitting: Internal Medicine

## 2017-02-27 ENCOUNTER — Encounter: Payer: Medicare Other | Attending: Physical Medicine & Rehabilitation

## 2017-02-27 ENCOUNTER — Ambulatory Visit: Payer: Medicare Other | Admitting: Physical Medicine & Rehabilitation

## 2017-02-27 ENCOUNTER — Encounter: Payer: Self-pay | Admitting: Physical Medicine & Rehabilitation

## 2017-02-27 VITALS — BP 131/82 | HR 61

## 2017-02-27 DIAGNOSIS — M961 Postlaminectomy syndrome, not elsewhere classified: Secondary | ICD-10-CM

## 2017-02-27 DIAGNOSIS — M4126 Other idiopathic scoliosis, lumbar region: Secondary | ICD-10-CM | POA: Diagnosis not present

## 2017-02-27 DIAGNOSIS — M419 Scoliosis, unspecified: Secondary | ICD-10-CM | POA: Diagnosis not present

## 2017-02-27 DIAGNOSIS — M48061 Spinal stenosis, lumbar region without neurogenic claudication: Secondary | ICD-10-CM | POA: Diagnosis not present

## 2017-02-27 DIAGNOSIS — M1611 Unilateral primary osteoarthritis, right hip: Secondary | ICD-10-CM | POA: Diagnosis present

## 2017-02-27 MED ORDER — HYDROCODONE-ACETAMINOPHEN 10-325 MG PO TABS: 1.0000 | ORAL_TABLET | Freq: Three times a day (TID) | ORAL | 0 refills | Status: DC | PRN

## 2017-02-27 NOTE — Progress Notes (Signed)
Subjective:    Patient ID: Hannah Scott, female    DOB: 1951/04/07, 66 y.o.   MRN: [id number]  HPI Amb with walker mostly in home but tries to get out in yard. No falls Bathing and dressing independantly Drives without restrictions Takes Hydrocodone 10mg  3 times per day Early in am , in afternoon and at bedtime Takes gabapentin at hs for "legs jumping and tingling" Pain Inventory Average Pain 7 Pain Right Now 7 My pain is constant, burning, tingling and aching  In the last 24 hours, has pain interfered with the following? General activity 10 Relation with others 10 Enjoyment of life 10 What TIME of day is your pain at its worst? . Sleep (in general) Poor  Pain is worse with: walking and standing Pain improves with: rest and medication Relief from Meds: 6  Mobility use a cane use a walker ability to climb steps?  yes do you drive?  yes  Function I need assistance with the following:  household duties and shopping  Neuro/Psych weakness numbness tingling trouble walking depression  Prior Studies Any changes since last visit?  no  Physicians involved in your care Any changes since last visit?  no   Family History  Problem Relation Age of Onset  . Heart failure Mother   . Pneumonia Mother   . Hypertension Mother   . Heart disease Mother   . Stroke Maternal Grandfather   . Hypertension Maternal Grandfather   . Heart attack Father   . Heart disease Father   . Asthma Paternal Uncle        PAT UNCLES  . Heart attack Paternal Uncle    Social History   Socioeconomic History  . Marital status: Married    Spouse name: Not on file  . Number of children: Not on file  . Years of education: Not on file  . Highest education level: Not on file  Social Needs  . Financial resource strain: Not on file  . Food insecurity - worry: Not on file  . Food insecurity - inability: Not on file  . Transportation needs - medical: Not on file  . Transportation needs -  non-medical: Not on file  Occupational History  . Not on file  Tobacco Use  . Smoking status: Never Smoker  . Smokeless tobacco: Never Used  Substance and Sexual Activity  . Alcohol use: No    Alcohol/week: 0.0 oz  . Drug use: No  . Sexual activity: No    Birth control/protection: Surgical  Other Topics Concern  . Not on file  Social History Narrative  . Not on file   Past Surgical History:  Procedure Laterality Date  . ANKLE SURGERY Left    ligament  . APPENDECTOMY  1987   AT [facility]  . BACK SURGERY     Fusion  . BREAST LUMPECTOMY  2003   radiation on right  . CARDIOVASCULAR STRESS TEST  09/07/05   Nuclear, was negative  . CATARACT EXTRACTION Bilateral 01,03  . OOPHORECTOMY     LSO -RSO  . PELVIC LAPAROSCOPY  1989   RSO, LYSIS OF ADHESIONS  . TOTAL ABDOMINAL HYSTERECTOMY  1987   LSO, APPENDECTOMY  . TOTAL HIP ARTHROPLASTY Right 10/28/2013   dr Lorin Mercy   Past Medical History:  Diagnosis Date  . Anemia    hx  . Arthritis   . Asthma    PFTs, February, 2011, moderate obstructive disease with response to bronchodilators, normal lung volumes, moderate reduction in diffusing  capacity  . Atrial septal aneurysm    Echo, 2008-not noted on 13 echo  . CAD (coronary artery disease)    90% distal LAD in the past  /   nuclear, 2008, no ischemia, ejection fraction 70%  . Cancer ([place]) 2002   DUCTAL CIS--S/P LUMPECTOMY, RADIATION AND 6 WEEKS OF TAMOXIFEN  . D-dimer, elevated    January, 2014  . Depression   . Ejection fraction    EF 60%, echo, October, 2008  . Elevated CPK    January, 2014  . Endometriosis 1989   RIGHT TUBE  . Endometriosis 1987   LEFT TUBE/OVARY W FOCAL IN-SITU ENDOMETRIAL ADENOCARCINOMA  . GERD (gastroesophageal reflux disease)    occ  . H/O hiatal hernia    ?  [name] Hyperlipidemia   . Hypertension   . Hypothyroidism    Patient has had in the past that she does not need treatment  . Kyphoscoliosis   . Obstructive airway disease ([place])   . Pinched nerve      lower back  . Shortness of breath   . UTI (lower urinary tract infection)    There were no vitals taken for this visit.  Opioid Risk Score:   Fall Risk Score:  `1  Depression screen PHQ 2/9  Depression screen [facility] 2/9 01/27/2017 01/10/2017 01/02/2017 09/08/2016 01/06/2016 07/11/2014  Decreased Interest 1 0 0 0 0 3  Down, Depressed, Hopeless 1 2 1 1 1 2   PHQ - 2 Score 2 2 1 1 1 5   Altered sleeping - 1 - - - 3  Tired, decreased energy - 1 - - - 3  Change in appetite - 0 - - - 0  Feeling bad or failure about yourself  - 1 - - - 0  Trouble concentrating - 0 - - - 0  Moving slowly or fidgety/restless - 0 - - - 0  Suicidal thoughts - 0 - - - 0  PHQ-9 Score - 5 - - - 11  Difficult doing work/chores - Not difficult at all - - - -  Some recent data might be hidden    Review of Systems  Constitutional: Negative.   HENT: Negative.   Eyes: Negative.   Respiratory: Negative.   Cardiovascular: Negative.   Gastrointestinal: Negative.   Endocrine: Negative.   Genitourinary: Negative.   Musculoskeletal: Negative.   Skin: Negative.   Allergic/Immunologic: Negative.   Neurological: Negative.   Hematological: Negative.   Psychiatric/Behavioral: Negative.   All other systems reviewed and are negative.      Objective:   Physical Exam  Constitutional: She is oriented to person, place, and time. She appears well-developed and well-nourished. No distress.  HENT:  Head: Normocephalic and atraumatic.  Neurological: She is alert and oriented to person, place, and time.  Skin: She is not diaphoretic.  Psychiatric: She has a normal mood and affect. Her behavior is normal. Judgment and thought content normal.  Nursing note and vitals reviewed.  Dextroconvex kyphoscoliosis  4/5 in bilateral HF, KE, ADF Amb with forward flexed posture, no evidence of toe drag or knee instability  No tenderness to palpation along the lumbar paraspinal musculature. Has reduced range of motion in the right hip  compared to the left side.     Assessment & Plan:  1.  Severe Kyphoscoliosis, severe chronic pain, gets about 30% relief with her chronic pain medications.  We discussed that this is about average for narcotic analgesic  used on a chronic basis.  We will continue  current doses of hydrocodone 10 mg 3 times daily. Continue opioid monitoring program. This consists of regular clinic visits, examinations, urine drug screen, pill counts as well as use of [place] controlled substance reporting System. We reviewed PMP aware data.  Went over overdose risk as well as narcotic score. 2.  Chronic radicular pain, continue gabapentin 400 mg nightly  Continue monthly nurse practitioner visits MD visit in 6 months

## 2017-03-29 ENCOUNTER — Encounter: Payer: Self-pay | Admitting: Registered Nurse

## 2017-03-29 ENCOUNTER — Encounter: Payer: Medicare Other | Attending: Physical Medicine & Rehabilitation | Admitting: Registered Nurse

## 2017-03-29 VITALS — BP 135/78 | HR 75 | Resp 14

## 2017-03-29 DIAGNOSIS — M24551 Contracture, right hip: Secondary | ICD-10-CM

## 2017-03-29 DIAGNOSIS — M961 Postlaminectomy syndrome, not elsewhere classified: Secondary | ICD-10-CM

## 2017-03-29 DIAGNOSIS — G894 Chronic pain syndrome: Secondary | ICD-10-CM | POA: Diagnosis not present

## 2017-03-29 DIAGNOSIS — M1611 Unilateral primary osteoarthritis, right hip: Secondary | ICD-10-CM | POA: Insufficient documentation

## 2017-03-29 DIAGNOSIS — M5416 Radiculopathy, lumbar region: Secondary | ICD-10-CM | POA: Diagnosis not present

## 2017-03-29 DIAGNOSIS — Z79899 Other long term (current) drug therapy: Secondary | ICD-10-CM | POA: Diagnosis not present

## 2017-03-29 DIAGNOSIS — M4126 Other idiopathic scoliosis, lumbar region: Secondary | ICD-10-CM | POA: Diagnosis not present

## 2017-03-29 DIAGNOSIS — M48062 Spinal stenosis, lumbar region with neurogenic claudication: Secondary | ICD-10-CM

## 2017-03-29 DIAGNOSIS — M419 Scoliosis, unspecified: Secondary | ICD-10-CM | POA: Diagnosis not present

## 2017-03-29 DIAGNOSIS — M48061 Spinal stenosis, lumbar region without neurogenic claudication: Secondary | ICD-10-CM | POA: Diagnosis not present

## 2017-03-29 DIAGNOSIS — Z5181 Encounter for therapeutic drug level monitoring: Secondary | ICD-10-CM | POA: Diagnosis not present

## 2017-03-29 MED ORDER — HYDROCODONE-ACETAMINOPHEN 10-325 MG PO TABS: 1.0000 | ORAL_TABLET | Freq: Three times a day (TID) | ORAL | 0 refills | Status: DC | PRN

## 2017-03-29 NOTE — Progress Notes (Signed)
Subjective:    Patient ID: Hannah Scott, female    DOB: Sep 06, 1950, 65 y.o.   MRN: [id number]  HPI: Hannah Scott is a 66 year old female who returns for follow up appointmentfor chronic pain and medication refill. She states her pain is located in her lower back pain mainly right side and right hip pain. Also states with her increase activity she is experiencing increase intensity of pain, we will allow an increase in her Hydrocodone tablets #100. She rates her pain 7. Her current exercise regime is walking for short distances using her cadillac walker or cane in her home and using her stationary bicycle weekly for 5-10 minutes.  [name] Morphine equivalent is 33.33  MME.  Her last UDS was on 08/02/2016  it was consistent.     Pain Inventory Average Pain 8 Pain Right Now 7 My pain is constant, burning, tingling and aching  In the last 24 hours, has pain interfered with the following? General activity 10 Relation with others 10 Enjoyment of life 10 What TIME of day is your pain at its worst? daytime evening and night Sleep (in general) Poor  Pain is worse with: walking and standing Pain improves with: rest and medication Relief from Meds: 6  Mobility walk with assistance use a cane use a walker ability to climb steps?  yes do you drive?  yes  Function disabled: date disabled . I need assistance with the following:  household duties  Neuro/Psych weakness numbness tingling trouble walking depression  Prior Studies Any changes since last visit?  no  Physicians involved in your care Any changes since last visit?  no   Family History  Problem Relation Age of Onset  . Heart failure Mother   . Pneumonia Mother   . Hypertension Mother   . Heart disease Mother   . Stroke Maternal Grandfather   . Hypertension Maternal Grandfather   . Heart attack Father   . Heart disease Father   . Asthma Paternal Uncle        PAT UNCLES  . Heart attack Paternal Uncle     Social History   Socioeconomic History  . Marital status: Married    Spouse name: None  . Number of children: None  . Years of education: None  . Highest education level: None  Social Needs  . Financial resource strain: None  . Food insecurity - worry: None  . Food insecurity - inability: None  . Transportation needs - medical: None  . Transportation needs - non-medical: None  Occupational History  . None  Tobacco Use  . Smoking status: Never Smoker  . Smokeless tobacco: Never Used  Substance and Sexual Activity  . Alcohol use: No    Alcohol/week: 0.0 oz  . Drug use: No  . Sexual activity: No    Birth control/protection: Surgical  Other Topics Concern  . None  Social History Narrative  . None   Past Surgical History:  Procedure Laterality Date  . ANKLE SURGERY Left    ligament  . APPENDECTOMY  1987   AT [facility]  . BACK SURGERY     Fusion  . BREAST LUMPECTOMY  2003   radiation on right  . BREAST LUMPECTOMY WITH RADIOACTIVE SEED LOCALIZATION Left 02/12/2016   Procedure: LEFT BREAST LUMPECTOMY WITH RADIOACTIVE SEED LOCALIZATION;  Surgeon: Excell Seltzer, MD;  Location: [facility];  Service: General;  Laterality: Left;  . CARDIOVASCULAR STRESS TEST  09/07/05   Nuclear, was  negative  . CATARACT EXTRACTION Bilateral 01,03  . OOPHORECTOMY     LSO -RSO  . PELVIC LAPAROSCOPY  1989   RSO, LYSIS OF ADHESIONS  . TOTAL ABDOMINAL HYSTERECTOMY  1987   LSO, APPENDECTOMY  . TOTAL HIP ARTHROPLASTY Right 10/28/2013   dr Lorin Mercy  . TOTAL HIP ARTHROPLASTY Right 10/28/2013   Procedure: TOTAL HIP ARTHROPLASTY ANTERIOR APPROACH;  Surgeon: Marybelle Killings, MD;  Location: [facility];  Service: Orthopedics;  Laterality: Right;  Right Total Hip Arthroplasty, Direct Anterior Approach   Past Medical History:  Diagnosis Date  . Anemia    hx  . Arthritis   . Asthma    PFTs, February, 2011, moderate obstructive disease with response to bronchodilators, normal lung volumes, moderate  reduction in diffusing capacity  . Atrial septal aneurysm    Echo, 2008-not noted on 13 echo  . CAD (coronary artery disease)    90% distal LAD in the past  /   nuclear, 2008, no ischemia, ejection fraction 70%  . Cancer ([place]) 2002   DUCTAL CIS--S/P LUMPECTOMY, RADIATION AND 6 WEEKS OF TAMOXIFEN  . D-dimer, elevated    January, 2014  . Depression   . Ejection fraction    EF 60%, echo, October, 2008  . Elevated CPK    January, 2014  . Endometriosis 1989   RIGHT TUBE  . Endometriosis 1987   LEFT TUBE/OVARY W FOCAL IN-SITU ENDOMETRIAL ADENOCARCINOMA  . GERD (gastroesophageal reflux disease)    occ  . H/O hiatal hernia    ?  [name] Hyperlipidemia   . Hypertension   . Hypothyroidism    Patient has had in the past that she does not need treatment  . Kyphoscoliosis   . Obstructive airway disease ([place])   . Pinched nerve    lower back  . Shortness of breath   . UTI (lower urinary tract infection)    BP 135/78 (BP Location: Left Arm, Patient Position: Sitting, Cuff Size: Normal)   Pulse 75   Resp 14   SpO2 95%   Opioid Risk Score:  0 Fall Risk Score:  `1  Depression screen PHQ 2/9  Depression screen [facility] 2/9 01/27/2017 01/10/2017 01/02/2017 09/08/2016 01/06/2016 07/11/2014  Decreased Interest 1 0 0 0 0 3  Down, Depressed, Hopeless 1 2 1 1 1 2   PHQ - 2 Score 2 2 1 1 1 5   Altered sleeping - 1 - - - 3  Tired, decreased energy - 1 - - - 3  Change in appetite - 0 - - - 0  Feeling bad or failure about yourself  - 1 - - - 0  Trouble concentrating - 0 - - - 0  Moving slowly or fidgety/restless - 0 - - - 0  Suicidal thoughts - 0 - - - 0  PHQ-9 Score - 5 - - - 11  Difficult doing work/chores - Not difficult at all - - - -  Some recent data might be hidden    Review of Systems  HENT: Negative.   Eyes: Negative.   Respiratory: Negative.   Cardiovascular: Negative.   Gastrointestinal: Negative.   Endocrine: Negative.   Genitourinary: Negative.   Musculoskeletal: Positive for gait  problem.  Skin: Negative.   Allergic/Immunologic: Negative.   Neurological: Positive for weakness and numbness.       Tingling  Hematological: Negative.   Psychiatric/Behavioral: Positive for dysphoric mood.  All other systems reviewed and are negative.      Objective:   Physical Exam  Constitutional: She is oriented to person, place, and time. She appears well-developed and well-nourished.  HENT:  Head: Normocephalic and atraumatic.  Neck: Normal range of motion. Neck supple.  Cardiovascular: Normal rate and regular rhythm.  Pulmonary/Chest: Effort normal and breath sounds normal.  Musculoskeletal:  Normal Muscle Bulk and Muscle Testing Reveals: Upper Extremities: Full ROM and Muscle Strength 5/5 Lumbar Paraspinal Tenderness: L-4-L-5  Lumbar Scoliosis Right Greater Trochanter Tenderness Lower Extremities: Full ROM and Muscle Strength 5/5 Arises from Table Slowly using walker for support Narrow Based Gait  Neurological: She is alert and oriented to person, place, and time.  Skin: Skin is warm and dry.  Psychiatric: She has a normal mood and affect.  Nursing note and vitals reviewed.         Assessment & Plan:  1 Lumbar post laminectomy syndrome, s/p lumbar fusion: 03/29/2017 Refilled: Hydrocodone 10/325mg  one tablet every 6 hours #100. We will continue the opioid monitoring program, this consists of regular clinic visits, examinations, urine drug screen, pill counts as well as use of [place] Controlled Substance Reporting system. 2. Right Hip OA: S/P Right Hip Replacement 10/28/2013. 03/29/2017 3.Depression: Continue Cymbalta. Has Family Support and Friends. Counseling with Doristine Bosworth. 03/29/2017 4. Right Greater Trochanteric Tenderness: No complaints today. Continue with Ice and Heat Therapy.03/29/2017  15 minutes of face to face patient care time was spent during this visit. All questions were encouraged and answered.  F/U in 1 month

## 2017-04-20 ENCOUNTER — Other Ambulatory Visit: Payer: Self-pay | Admitting: Family Medicine

## 2017-04-20 ENCOUNTER — Encounter: Payer: Self-pay | Admitting: Internal Medicine

## 2017-04-20 DIAGNOSIS — J45901 Unspecified asthma with (acute) exacerbation: Secondary | ICD-10-CM | POA: Diagnosis not present

## 2017-04-20 MED ORDER — IPRATROPIUM-ALBUTEROL 0.5-2.5 (3) MG/3ML IN SOLN: 3.0000 mL | RESPIRATORY_TRACT | 0 refills | Status: DC | PRN

## 2017-04-27 ENCOUNTER — Encounter: Payer: Self-pay | Admitting: Internal Medicine

## 2017-04-28 NOTE — Telephone Encounter (Signed)
Please advise and I will send in RX

## 2017-04-29 MED ORDER — DULOXETINE HCL 60 MG PO CPEP: 60.0000 mg | ORAL_CAPSULE | Freq: Every day | ORAL | 3 refills | Status: DC

## 2017-05-03 ENCOUNTER — Encounter: Payer: Self-pay | Admitting: Registered Nurse

## 2017-05-03 ENCOUNTER — Encounter: Payer: Medicare Other | Attending: Physical Medicine & Rehabilitation | Admitting: Registered Nurse

## 2017-05-03 ENCOUNTER — Other Ambulatory Visit: Payer: Self-pay

## 2017-05-03 VITALS — BP 134/69 | HR 65

## 2017-05-03 DIAGNOSIS — M48061 Spinal stenosis, lumbar region without neurogenic claudication: Secondary | ICD-10-CM | POA: Insufficient documentation

## 2017-05-03 DIAGNOSIS — Z5181 Encounter for therapeutic drug level monitoring: Secondary | ICD-10-CM

## 2017-05-03 DIAGNOSIS — M1611 Unilateral primary osteoarthritis, right hip: Secondary | ICD-10-CM | POA: Diagnosis not present

## 2017-05-03 DIAGNOSIS — M4126 Other idiopathic scoliosis, lumbar region: Secondary | ICD-10-CM | POA: Insufficient documentation

## 2017-05-03 DIAGNOSIS — M24551 Contracture, right hip: Secondary | ICD-10-CM | POA: Diagnosis not present

## 2017-05-03 DIAGNOSIS — Z79899 Other long term (current) drug therapy: Secondary | ICD-10-CM

## 2017-05-03 DIAGNOSIS — M419 Scoliosis, unspecified: Secondary | ICD-10-CM | POA: Diagnosis not present

## 2017-05-03 DIAGNOSIS — G894 Chronic pain syndrome: Secondary | ICD-10-CM | POA: Diagnosis not present

## 2017-05-03 DIAGNOSIS — M961 Postlaminectomy syndrome, not elsewhere classified: Secondary | ICD-10-CM | POA: Insufficient documentation

## 2017-05-03 MED ORDER — HYDROCODONE-ACETAMINOPHEN 10-325 MG PO TABS: 1.0000 | ORAL_TABLET | Freq: Three times a day (TID) | ORAL | 0 refills | Status: DC | PRN

## 2017-05-03 NOTE — Progress Notes (Signed)
Subjective:    Patient ID: Hannah Scott, female    DOB: August 11, 1950, 67 y.o.   MRN: [id number]  HPI: [name] is a 67 year old female who returns for follow up appointmentfor chronic pain and medication refill. She states her pain is located in her lower back pain mainly right side and right hip pain. She rates her pain 6. Her current exercise regime is walking for short distances using her cadillac walker or cane in her home and stationary bicycle weekly for 5-10 minutes.  Hannah Scott equivalent is 34.11  MME.  Her last UDS was on 08/02/2016  it was consistent. Oral Swab was performed Today.   Pain Inventory Average Pain 7 Pain Right Now 6 My pain is constant, burning, tingling and aching  In the last 24 hours, has pain interfered with the following? General activity 10 Relation with others 10 Enjoyment of life 10 What TIME of day is your pain at its worst? daytime evening and night Sleep (in general) Poor  Pain is worse with: walking and standing Pain improves with: rest and medication Relief from Meds: 5  Mobility walk with assistance use a cane use a walker ability to climb steps?  yes do you drive?  yes  Function disabled: date disabled . I need assistance with the following:  household duties  Neuro/Psych weakness numbness tingling trouble walking spasms depression  Prior Studies Any changes since last visit?  no  Physicians involved in your care Any changes since last visit?  no   Family History  Problem Relation Age of Onset  . Heart failure Mother   . Pneumonia Mother   . Hypertension Mother   . Heart disease Mother   . Stroke Maternal Grandfather   . Hypertension Maternal Grandfather   . Heart attack Father   . Heart disease Father   . Asthma Paternal Uncle        PAT UNCLES  . Heart attack Paternal Uncle    Social History   Socioeconomic History  . Marital status: Married    Spouse name: None  . Number of children: None    . Years of education: None  . Highest education level: None  Social Needs  . Financial resource strain: None  . Food insecurity - worry: None  . Food insecurity - inability: None  . Transportation needs - medical: None  . Transportation needs - non-medical: None  Occupational History  . None  Tobacco Use  . Smoking status: Never Smoker  . Smokeless tobacco: Never Used  Substance and Sexual Activity  . Alcohol use: No    Alcohol/week: 0.0 oz  . Drug use: No  . Sexual activity: No    Birth control/protection: Surgical  Other Topics Concern  . None  Social History Narrative  . None   Past Surgical History:  Procedure Laterality Date  . ANKLE SURGERY Left    ligament  . APPENDECTOMY  1987   AT [facility]  . BACK SURGERY     Fusion  . BREAST LUMPECTOMY  2003   radiation on right  . BREAST LUMPECTOMY WITH RADIOACTIVE SEED LOCALIZATION Left 02/12/2016   Procedure: LEFT BREAST LUMPECTOMY WITH RADIOACTIVE SEED LOCALIZATION;  Surgeon: Excell Seltzer, MD;  Location: [facility];  Service: General;  Laterality: Left;  . CARDIOVASCULAR STRESS TEST  09/07/05   Nuclear, was negative  . CATARACT EXTRACTION Bilateral 01,03  . OOPHORECTOMY     LSO -RSO  . PELVIC LAPAROSCOPY  1989   RSO, LYSIS OF ADHESIONS  . TOTAL ABDOMINAL HYSTERECTOMY  1987   LSO, APPENDECTOMY  . TOTAL HIP ARTHROPLASTY Right 10/28/2013   dr Lorin Mercy  . TOTAL HIP ARTHROPLASTY Right 10/28/2013   Procedure: TOTAL HIP ARTHROPLASTY ANTERIOR APPROACH;  Surgeon: Marybelle Killings, MD;  Location: [facility];  Service: Orthopedics;  Laterality: Right;  Right Total Hip Arthroplasty, Direct Anterior Approach   Past Medical History:  Diagnosis Date  . Anemia    hx  . Arthritis   . Asthma    PFTs, February, 2011, moderate obstructive disease with response to bronchodilators, normal lung volumes, moderate reduction in diffusing capacity  . Atrial septal aneurysm    Echo, 2008-not noted on 13 echo  . CAD (coronary artery  disease)    90% distal LAD in the past  /   nuclear, 2008, no ischemia, ejection fraction 70%  . Cancer ([place]) 2002   DUCTAL CIS--S/P LUMPECTOMY, RADIATION AND 6 WEEKS OF TAMOXIFEN  . D-dimer, elevated    January, 2014  . Depression   . Ejection fraction    EF 60%, echo, October, 2008  . Elevated CPK    January, 2014  . Endometriosis 1989   RIGHT TUBE  . Endometriosis 1987   LEFT TUBE/OVARY W FOCAL IN-SITU ENDOMETRIAL ADENOCARCINOMA  . GERD (gastroesophageal reflux disease)    occ  . H/O hiatal hernia    ?  [name] Hyperlipidemia   . Hypertension   . Hypothyroidism    Patient has had in the past that she does not need treatment  . Kyphoscoliosis   . Obstructive airway disease ([place])   . Pinched nerve    lower back  . Shortness of breath   . UTI (lower urinary tract infection)    Pulse 65   SpO2 97%   Opioid Risk Score:  0 Fall Risk Score:  `1  Depression screen PHQ 2/9  Depression screen [facility] 2/9 05/03/2017 01/27/2017 01/10/2017 01/02/2017 09/08/2016 01/06/2016 07/11/2014  Decreased Interest 1 1 0 0 0 0 3  Down, Depressed, Hopeless 1 1 2 1 1 1 2   PHQ - 2 Score 2 2 2 1 1 1 5   Altered sleeping - - 1 - - - 3  Tired, decreased energy - - 1 - - - 3  Change in appetite - - 0 - - - 0  Feeling bad or failure about yourself  - - 1 - - - 0  Trouble concentrating - - 0 - - - 0  Moving slowly or fidgety/restless - - 0 - - - 0  Suicidal thoughts - - 0 - - - 0  PHQ-9 Score - - 5 - - - 11  Difficult doing work/chores - - Not difficult at all - - - -  Some recent data might be hidden    Review of Systems  HENT: Negative.   Eyes: Negative.   Respiratory: Negative.   Cardiovascular: Negative.   Gastrointestinal: Negative.   Endocrine: Negative.   Genitourinary: Negative.   Musculoskeletal: Positive for gait problem.  Skin: Negative.   Allergic/Immunologic: Negative.   Neurological: Positive for weakness and numbness.       Tingling  Hematological: Negative.   Psychiatric/Behavioral:  Positive for dysphoric mood.  All other systems reviewed and are negative.      Objective:   Physical Exam  Constitutional: She is oriented to person, place, and time. She appears well-developed and well-nourished.  HENT:  Head: Normocephalic and atraumatic.  Neck: Normal range of  motion. Neck supple.  Cardiovascular: Normal rate and regular rhythm.  Pulmonary/Chest: Effort normal and breath sounds normal.  Musculoskeletal:  Normal Muscle Bulk and Muscle Testing Reveals: Upper Extremities: Full ROM and Muscle Strength 5/5 Lumbar Paraspinal Tenderness: L-3-L-5  Lumbar Scoliosis Right Greater Trochanter Tenderness Lower Extremities: Full ROM and Muscle Strength 5/5 Arises from Table Slowly using walker for support Narrow Based Gait  Neurological: She is alert and oriented to person, place, and time.  Skin: Skin is warm and dry.  Psychiatric: She has a normal mood and affect.  Nursing note and vitals reviewed.         Assessment & Plan:  1 Lumbar post laminectomy syndrome, s/p lumbar fusion: 05/03/2017 Refilled: Hydrocodone 10/325mg  one tablet every 6 hours #100. We will continue the opioid monitoring program, this consists of regular clinic visits, examinations, urine drug screen, pill counts as well as use of [place] Controlled Substance Reporting system. 2. Right Hip OA: S/P Right Hip Replacement 10/28/2013. 05/03/2017 3.Depression: Continue Cymbalta. Has Family Support and Friends. Counseling with Doristine Bosworth. 05/03/2017 4. Right Greater Trochanteric Tenderness: Continue with Ice and Heat Therapy.05/03/2017  15 minutes of face to face patient care time was spent during this visit. All questions were encouraged and answered.  F/U in 1 month

## 2017-05-08 LAB — DRUG TOX MONITOR 1 W/CONF, ORAL FLD
Amphetamines: NEGATIVE ng/mL (ref <10)
Barbiturates: NEGATIVE ng/mL (ref <10)
Benzodiazepines: NEGATIVE ng/mL (ref <0.50)
Buprenorphine: NEGATIVE ng/mL (ref <0.10)
CODEINE: NEGATIVE ng/mL (ref <2.5)
Cocaine: NEGATIVE ng/mL (ref <5.0)
DIHYDROCODEINE: 19.1 ng/mL — AB (ref <2.5)
Fentanyl: NEGATIVE ng/mL (ref <0.10)
HYDROCODONE: 99.3 ng/mL — AB (ref <2.5)
Heroin Metabolite: NEGATIVE ng/mL (ref <1.0)
Hydromorphone: NEGATIVE ng/mL (ref <2.5)
MARIJUANA: NEGATIVE ng/mL (ref <2.5)
MDMA: NEGATIVE ng/mL (ref <10)
METHADONE: NEGATIVE ng/mL (ref <5.0)
Meprobamate: NEGATIVE ng/mL (ref <2.5)
Morphine: NEGATIVE ng/mL (ref <2.5)
NICOTINE METABOLITE: NEGATIVE ng/mL (ref <5.0)
Norhydrocodone: 3.7 ng/mL — ABNORMAL HIGH (ref <2.5)
Noroxycodone: NEGATIVE ng/mL (ref <2.5)
OPIATES: POSITIVE ng/mL — AB (ref <2.5)
OXYCODONE: NEGATIVE ng/mL (ref <2.5)
OXYMORPHONE: NEGATIVE ng/mL (ref <2.5)
PHENCYCLIDINE: NEGATIVE ng/mL (ref <10)
TAPENTADOL: NEGATIVE ng/mL (ref <5.0)
TRAMADOL: NEGATIVE ng/mL (ref <5.0)
ZOLPIDEM: NEGATIVE ng/mL (ref <5.0)

## 2017-05-08 LAB — DRUG TOX ALC METAB W/CON, ORAL FLD: ALCOHOL METABOLITE: NEGATIVE ng/mL (ref <25)

## 2017-05-09 ENCOUNTER — Telehealth: Payer: Self-pay | Admitting: *Deleted

## 2017-05-09 NOTE — Telephone Encounter (Signed)
Oral swab drug screen was consistent for prescribed medications.  ?

## 2017-05-10 ENCOUNTER — Other Ambulatory Visit: Payer: Self-pay | Admitting: Internal Medicine

## 2017-05-22 ENCOUNTER — Other Ambulatory Visit: Payer: Self-pay | Admitting: Cardiology

## 2017-05-25 ENCOUNTER — Other Ambulatory Visit: Payer: Self-pay | Admitting: Cardiology

## 2017-06-02 ENCOUNTER — Other Ambulatory Visit: Payer: Self-pay

## 2017-06-02 ENCOUNTER — Encounter: Payer: Self-pay | Admitting: Registered Nurse

## 2017-06-02 ENCOUNTER — Encounter: Payer: Medicare Other | Attending: Physical Medicine & Rehabilitation | Admitting: Registered Nurse

## 2017-06-02 VITALS — BP 147/92 | HR 79

## 2017-06-02 DIAGNOSIS — M24551 Contracture, right hip: Secondary | ICD-10-CM

## 2017-06-02 DIAGNOSIS — M1611 Unilateral primary osteoarthritis, right hip: Secondary | ICD-10-CM | POA: Insufficient documentation

## 2017-06-02 DIAGNOSIS — G894 Chronic pain syndrome: Secondary | ICD-10-CM

## 2017-06-02 DIAGNOSIS — M419 Scoliosis, unspecified: Secondary | ICD-10-CM | POA: Diagnosis not present

## 2017-06-02 DIAGNOSIS — M961 Postlaminectomy syndrome, not elsewhere classified: Secondary | ICD-10-CM | POA: Diagnosis not present

## 2017-06-02 DIAGNOSIS — Z79899 Other long term (current) drug therapy: Secondary | ICD-10-CM

## 2017-06-02 DIAGNOSIS — M48061 Spinal stenosis, lumbar region without neurogenic claudication: Secondary | ICD-10-CM | POA: Diagnosis not present

## 2017-06-02 DIAGNOSIS — M4126 Other idiopathic scoliosis, lumbar region: Secondary | ICD-10-CM | POA: Diagnosis not present

## 2017-06-02 DIAGNOSIS — Z5181 Encounter for therapeutic drug level monitoring: Secondary | ICD-10-CM | POA: Diagnosis not present

## 2017-06-02 MED ORDER — HYDROCODONE-ACETAMINOPHEN 10-325 MG PO TABS: 1.0000 | ORAL_TABLET | Freq: Three times a day (TID) | ORAL | 0 refills | Status: DC | PRN

## 2017-06-02 NOTE — Progress Notes (Signed)
Subjective:    Patient ID: Hannah Scott, female    DOB: Oct 28, 1950, 67 y.o.   MRN: [id number]  HPI: Hannah Scott is a 67 year old female who returns for follow up appointmentfor chronic pain and medication refill.She states her pain is located in her lower back. She rates her pain 6. Her current exercise regime is walking and using her stationary bicycle 5-10 minutes.  Ms. Ekman Morphine equivalent is 30.00  MME.  Oral Swab was performed on 05/03/2017  it was consistent.  Pain Inventory Average Pain 7 Pain Right Now 6 My pain is constant, burning, tingling and aching  In the last 24 hours, has pain interfered with the following? General activity 10 Relation with others 10 Enjoyment of life 10 What TIME of day is your pain at its worst? daytime evening and night Sleep (in general) Poor  Pain is worse with: walking and standing Pain improves with: rest and medication Relief from Meds: 4  Mobility walk with assistance use a cane use a walker ability to climb steps?  yes do you drive?  yes  Function disabled: date disabled . I need assistance with the following:  household duties  Neuro/Psych weakness numbness tingling trouble walking spasms depression  Prior Studies Any changes since last visit?  no  Physicians involved in your care Any changes since last visit?  no   Family History  Problem Relation Age of Onset  . Heart failure Mother   . Pneumonia Mother   . Hypertension Mother   . Heart disease Mother   . Stroke Maternal Grandfather   . Hypertension Maternal Grandfather   . Heart attack Father   . Heart disease Father   . Asthma Paternal Uncle        PAT UNCLES  . Heart attack Paternal Uncle    Social History   Socioeconomic History  . Marital status: Married    Spouse name: None  . Number of children: None  . Years of education: None  . Highest education level: None  Social Needs  . Financial resource strain: None  . Food insecurity -  worry: None  . Food insecurity - inability: None  . Transportation needs - medical: None  . Transportation needs - non-medical: None  Occupational History  . None  Tobacco Use  . Smoking status: Never Smoker  . Smokeless tobacco: Never Used  Substance and Sexual Activity  . Alcohol use: No    Alcohol/week: 0.0 oz  . Drug use: No  . Sexual activity: No    Birth control/protection: Surgical  Other Topics Concern  . None  Social History Narrative  . None   Past Surgical History:  Procedure Laterality Date  . ANKLE SURGERY Left    ligament  . APPENDECTOMY  1987   AT [facility]  . BACK SURGERY     Fusion  . BREAST LUMPECTOMY  2003   radiation on right  . BREAST LUMPECTOMY WITH RADIOACTIVE SEED LOCALIZATION Left 02/12/2016   Procedure: LEFT BREAST LUMPECTOMY WITH RADIOACTIVE SEED LOCALIZATION;  Surgeon: Excell Seltzer, MD;  Location: [place];  Service: General;  Laterality: Left;  . CARDIOVASCULAR STRESS TEST  09/07/05   Nuclear, was negative  . CATARACT EXTRACTION Bilateral 01,03  . OOPHORECTOMY     LSO -RSO  . PELVIC LAPAROSCOPY  1989   RSO, LYSIS OF ADHESIONS  . TOTAL ABDOMINAL HYSTERECTOMY  1987   LSO, APPENDECTOMY  . TOTAL HIP ARTHROPLASTY Right 10/28/2013  dr Lorin Mercy  . TOTAL HIP ARTHROPLASTY Right 10/28/2013   Procedure: TOTAL HIP ARTHROPLASTY ANTERIOR APPROACH;  Surgeon: Marybelle Killings, MD;  Location: [place];  Service: Orthopedics;  Laterality: Right;  Right Total Hip Arthroplasty, Direct Anterior Approach   Past Medical History:  Diagnosis Date  . Anemia    hx  . Arthritis   . Asthma    PFTs, February, 2011, moderate obstructive disease with response to bronchodilators, normal lung volumes, moderate reduction in diffusing capacity  . Atrial septal aneurysm    Echo, 2008-not noted on 13 echo  . CAD (coronary artery disease)    90% distal LAD in the past  /   nuclear, 2008, no ischemia, ejection fraction 70%  . Cancer ([place]) 2002   DUCTAL CIS--S/P  LUMPECTOMY, RADIATION AND 6 WEEKS OF TAMOXIFEN  . D-dimer, elevated    January, 2014  . Depression   . Ejection fraction    EF 60%, echo, October, 2008  . Elevated CPK    January, 2014  . Endometriosis 1989   RIGHT TUBE  . Endometriosis 1987   LEFT TUBE/OVARY W FOCAL IN-SITU ENDOMETRIAL ADENOCARCINOMA  . GERD (gastroesophageal reflux disease)    occ  . H/O hiatal hernia    ?  [name] Hyperlipidemia   . Hypertension   . Hypothyroidism    Patient has had in the past that she does not need treatment  . Kyphoscoliosis   . Obstructive airway disease ([place])   . Pinched nerve    lower back  . Shortness of breath   . UTI (lower urinary tract infection)    BP (!) 162/89   Pulse 79   SpO2 92%   Opioid Risk Score:  0 Fall Risk Score:  `1  Depression screen PHQ 2/9  Depression screen [facility] 2/9 06/02/2017 05/03/2017 01/27/2017 01/10/2017 01/02/2017 09/08/2016 01/06/2016  Decreased Interest 1 1 1  0 0 0 0  Down, Depressed, Hopeless 1 1 1 2 1 1 1   PHQ - 2 Score 2 2 2 2 1 1 1   Altered sleeping - - - 1 - - -  Tired, decreased energy - - - 1 - - -  Change in appetite - - - 0 - - -  Feeling bad or failure about yourself  - - - 1 - - -  Trouble concentrating - - - 0 - - -  Moving slowly or fidgety/restless - - - 0 - - -  Suicidal thoughts - - - 0 - - -  PHQ-9 Score - - - 5 - - -  Difficult doing work/chores - - - Not difficult at all - - -  Some recent data might be hidden    Review of Systems  HENT: Negative.   Eyes: Negative.   Respiratory: Negative.   Cardiovascular: Negative.   Gastrointestinal: Negative.   Endocrine: Negative.   Genitourinary: Negative.   Musculoskeletal: Positive for gait problem.  Skin: Negative.   Allergic/Immunologic: Negative.   Neurological: Positive for weakness and numbness.       Tingling  Hematological: Negative.   Psychiatric/Behavioral: Positive for dysphoric mood.  All other systems reviewed and are negative.      Objective:   Physical Exam    Constitutional: She is oriented to person, place, and time. She appears well-developed and well-nourished.  HENT:  Head: Normocephalic and atraumatic.  Neck: Normal range of motion. Neck supple.  Cardiovascular: Normal rate and regular rhythm.  Pulmonary/Chest: Effort normal and breath sounds normal.  Musculoskeletal:  Normal Muscle Bulk and Muscle Testing Reveals: Upper Extremities: Full ROM and Muscle Strength 5/5 Lumbar Paraspinal Tenderness: L-3-L-5 Lumbar Scoliosis Lower Extremities: Full ROM and Muscle Strength 5/5 Arises from Table Slowly using walker for support Narrow Based Gait  Neurological: She is alert and oriented to person, place, and time.  Skin: Skin is warm and dry.  Psychiatric: She has a normal mood and affect.  Nursing note and vitals reviewed.         Assessment & Plan:  1 Lumbar post laminectomy syndrome, s/p lumbar fusion: 06/02/2017 Refilled: Hydrocodone 10/325mg  one tablet every 6 hours #100. We will continue the opioid monitoring program, this consists of regular clinic visits, examinations, urine drug screen, pill counts as well as use of [place] Controlled Substance Reporting system. 2. Right Hip OA: S/P Right Hip Replacement 10/28/2013. 06/02/2017 3.Depression: Continue Cymbalta. Has Family Support and Friends. Counseling with Doristine Bosworth. 06/02/2017 4. Right Greater Trochanteric Tenderness: No complaints Today. Continue to Monitor. Continue with Ice and Heat Therapy.06/02/2017  15 minutes of face to face patient care time was spent during this visit. All questions were encouraged and answered.  F/U in 1 month

## 2017-06-19 ENCOUNTER — Ambulatory Visit: Payer: Medicare Other | Admitting: Cardiology

## 2017-06-19 ENCOUNTER — Encounter: Payer: Self-pay | Admitting: Cardiology

## 2017-06-19 VITALS — BP 140/90 | HR 77 | Ht 60.0 in | Wt 161.0 lb

## 2017-06-19 DIAGNOSIS — I2510 Atherosclerotic heart disease of native coronary artery without angina pectoris: Secondary | ICD-10-CM

## 2017-06-19 DIAGNOSIS — I10 Essential (primary) hypertension: Secondary | ICD-10-CM | POA: Diagnosis not present

## 2017-06-19 DIAGNOSIS — E782 Mixed hyperlipidemia: Secondary | ICD-10-CM

## 2017-06-19 MED ORDER — BENAZEPRIL HCL 40 MG PO TABS: 40.0000 mg | ORAL_TABLET | Freq: Every day | ORAL | 3 refills | Status: DC

## 2017-06-19 MED ORDER — ATORVASTATIN CALCIUM 20 MG PO TABS: 20.0000 mg | ORAL_TABLET | Freq: Every day | ORAL | 3 refills | Status: DC

## 2017-06-19 NOTE — Patient Instructions (Signed)
Medication Instructions:  1) INCREASE Benazepril to 40mg  once daily  Labwork: None  Testing/Procedures: .None  Follow-Up: Your physician wants you to follow-up in: 6 months with Dr. Meda Coffee.  You will receive a reminder letter in the mail two months in advance. If you don't receive a letter, please call our office to schedule the follow-up appointment.   Any Other Special Instructions Will Be Listed Below (If Applicable).     If you need a refill on your cardiac medications before your next appointment, please call your pharmacy.

## 2017-06-19 NOTE — Progress Notes (Signed)
Patient ID: Hannah Scott, female   DOB: 01/10/1951, 67 y.o.   MRN: [id number]      Cardiology Office Note  Date:  06/19/2017   ID:  Hannah Scott, DOB January 09, 1951, MRN [id number]  PCP:  Binnie Rail, MD  Cardiologist:  Ena Dawley, MD , previously Dr Ron Parker  Chief complain: Re-establish care   History of Present Illness:  Hannah Scott is a 67 y.o. female with h/o 2 prior MIs and to cardiac catheterization the last one in 1999. She was told at the time that her myocardial infarction was result of coronary spasm and she didn't get any stents placed. She has known distal LAD disease.  She has been followed by Dr. Ron Parker and was stable, her last stress test was done in December 2014 where she had mild ischemia and conservative management was decided. She was taking amlodipine for years but developed significant lower extremity edema that resolved after she discontinued. In December 2016 she was hospitalized with acute respiratory failure secondary to asthma attack triggered by influenza A. She states that despite recovery from these her shortness of breath has never resolved. She feels short of breath intermittently on exertion and at rest. She is currently not wheezing and doesn't need to use rescue inhalers.  02/01/16 - 6 months follow up, the patient has been doing well, denies any chest pain and has stable dyspnea on exertion. No recent asthma attacks or hospitalization. She has been compliant to her meds and tolerates them well. She underwent Lexiscan nuclear stress test in March 2017 that was negative for ischemia, normal LVEF. She also denies any recent LE edema, orthopnea no palpitations or syncope.  She walks with a walker very slowly but still able to perform all activities of daily living including shopping.  She has history of breast cancer and recently was found to have a new lump in her left breast, she is scheduled for lumpectomy on 02/12/2016.  06/19/2017 - the patient is coming after a  year, in the interim she has underwent breast lumpectomy that showed a benign lesion. She also underwent a lumbar laminectomy as a consequence of lifelong scoliosis. She denies any chest pain, she has stable dyspnea on exertion and shortness of breath at rest that she attributes to her asthma, this has been stable. She denies any palpitations dizziness or syncope. She is no lower extremity edema no claudications.  Past Medical History:  Diagnosis Date  . Anemia    hx  . Arthritis   . Asthma    PFTs, February, 2011, moderate obstructive disease with response to bronchodilators, normal lung volumes, moderate reduction in diffusing capacity  . Atrial septal aneurysm    Echo, 2008-not noted on 13 echo  . CAD (coronary artery disease)    90% distal LAD in the past  /   nuclear, 2008, no ischemia, ejection fraction 70%  . Cancer ([place]) 2002   DUCTAL CIS--S/P LUMPECTOMY, RADIATION AND 6 WEEKS OF TAMOXIFEN  . D-dimer, elevated    January, 2014  . Depression   . Ejection fraction    EF 60%, echo, October, 2008  . Elevated CPK    January, 2014  . Endometriosis 1989   RIGHT TUBE  . Endometriosis 1987   LEFT TUBE/OVARY W FOCAL IN-SITU ENDOMETRIAL ADENOCARCINOMA  . GERD (gastroesophageal reflux disease)    occ  . H/O hiatal hernia    ?  [name] Hyperlipidemia   . Hypertension   . Hypothyroidism    Patient  has had in the past that she does not need treatment  . Kyphoscoliosis   . Obstructive airway disease ([place])   . Pinched nerve    lower back  . Shortness of breath   . UTI (lower urinary tract infection)    Past Surgical History:  Procedure Laterality Date  . ANKLE SURGERY Left    ligament  . APPENDECTOMY  1987   AT [facility]  . BACK SURGERY     Fusion  . BREAST LUMPECTOMY  2003   radiation on right  . BREAST LUMPECTOMY WITH RADIOACTIVE SEED LOCALIZATION Left 02/12/2016   Procedure: LEFT BREAST LUMPECTOMY WITH RADIOACTIVE SEED LOCALIZATION;  Surgeon: Excell Seltzer, MD;  Location: [place];  Service: General;  Laterality: Left;  . CARDIOVASCULAR STRESS TEST  09/07/05   Nuclear, was negative  . CATARACT EXTRACTION Bilateral 01,03  . OOPHORECTOMY     LSO -RSO  . PELVIC LAPAROSCOPY  1989   RSO, LYSIS OF ADHESIONS  . TOTAL ABDOMINAL HYSTERECTOMY  1987   LSO, APPENDECTOMY  . TOTAL HIP ARTHROPLASTY Right 10/28/2013   dr Lorin Mercy  . TOTAL HIP ARTHROPLASTY Right 10/28/2013   Procedure: TOTAL HIP ARTHROPLASTY ANTERIOR APPROACH;  Surgeon: Marybelle Killings, MD;  Location: [place];  Service: Orthopedics;  Laterality: Right;  Right Total Hip Arthroplasty, Direct Anterior Approach    Current Outpatient Medications  Medication Sig Dispense Refill  . albuterol (PROVENTIL HFA;VENTOLIN HFA) 108 (90 Base) MCG/ACT inhaler Inhale 2 puffs into the lungs every 4 (four) hours as needed for wheezing or shortness of breath. 1 Inhaler 0  . atorvastatin (LIPITOR) 20 MG tablet Take 1 tablet (20 mg total) by mouth daily. Patient overdue for an appointment, needs to call and schedule for further refills 1st attempt 30 tablet 0  . benazepril (LOTENSIN) 40 MG tablet TAKE 1/2 TABLET EVERY DAY 45 tablet 1  . DULoxetine (CYMBALTA) 60 MG capsule Take 1 capsule (60 mg total) by mouth daily. 90 capsule 3  . fluticasone furoate-vilanterol (BREO ELLIPTA) 100-25 MCG/INH AEPB Inhale 1 puff into the lungs daily.    [name] gabapentin (NEURONTIN) 400 MG capsule Take 1 capsule (400 mg total) by mouth at bedtime. 30 capsule 5  . HYDROcodone-acetaminophen (NORCO) 10-325 MG tablet Take 1 tablet by mouth every 8 (eight) hours as needed. 100 tablet 0  . ipratropium-albuterol (DUONEB) 0.5-2.5 (3) MG/3ML SOLN Take 3 mLs by nebulization every 4 (four) hours as needed (wheezing or SOB). During exacerbation 360 mL 0  . levothyroxine (SYNTHROID, LEVOTHROID) 50 MCG tablet Take 1 tablet (50 mcg total) by mouth daily. --- Labs needed for refills. 90 tablet 0  . metoprolol tartrate (LOPRESSOR) 25 MG tablet Take 25 mg by mouth 2 (two)  times daily.     No current facility-administered medications for this visit.     Allergies:   Fluticasone-salmeterol; Norvasc [amlodipine besylate]; and Tape    Social History:  The patient  reports that  has never smoked. she has never used smokeless tobacco. She reports that she does not drink alcohol or use drugs.   Family History:  The patient's family history includes Asthma in her paternal uncle; Heart attack in her father and paternal uncle; Heart disease in her father and mother; Heart failure in her mother; Hypertension in her maternal grandfather and mother; Pneumonia in her mother; Stroke in her maternal grandfather.   ROS:  Please see the history of present illness.   Otherwise, review of systems are positive for none.  All other systems are reviewed and negative.   PHYSICAL EXAM: VS:  BP 140/90 (BP Location: Right Arm, Patient Position: Sitting, Cuff Size: Normal)   Pulse 77   Ht 5' (1.524 m)   Wt 161 lb (73 kg)   BMI 31.44 kg/m  , BMI Body mass index is 31.44 kg/m. GEN: Well nourished, well developed, in no acute distress  HEENT: normal  Neck: no JVD, carotid bruits, or masses Cardiac: RRR; no murmurs, rubs, or gallops,no edema  Respiratory:  clear to auscultation bilaterally, normal work of breathing GI: soft, nontender, nondistended, + BS MS: no deformity or atrophy  Skin: warm and dry, no rash Neuro:  Strength and sensation are intact Psych: euthymic mood, full affect  EKG:  EKG is ordered today and personally reviewed, and she was normal sinus rhythm Q-wave in anteriorly unchanged from prior.  Recent Labs: 07/06/2016: Hemoglobin 11.4; Platelets 232.0 01/10/2017: ALT 8; BUN 14; Creatinine, Ser 0.59; Potassium 4.6; Sodium 141; TSH 0.12   Lipid Panel    Component Value Date/Time   CHOL 131 01/06/2016 1630   TRIG 83.0 01/06/2016 1630   HDL 50.10 01/06/2016 1630   CHOLHDL 3 01/06/2016 1630   VLDL 16.6 01/06/2016 1630   LDLCALC 64 01/06/2016 1630    LDLDIRECT 154.8 02/20/2009 1116     Wt Readings from Last 3 Encounters:  06/19/17 161 lb (73 kg)  01/10/17 156 lb (70.8 kg)  11/09/16 154 lb (69.9 kg)    TTE: 08/31/2011 Left ventricle: The cavity size was normal. Wall thickness was increased in a pattern of mild LVH. Systolic function was normal. The estimated ejection fraction was 55%. Wall motion was normal; there were no regional wall motion abnormalities. - Left atrium: The atrium was mildly dilated. - Atrial septum: No defect or patent foramen ovale was identified. - Pulmonary arteries: PA peak pressure: 41mm Hg (S).  Lexiscan nuclear stress test: 06/30/2015  Nuclear stress EF: 55%.  There was no ST segment deviation noted during stress.  Defect 1: There is a small defect of mild severity present in the apical anterior location. This may represent distal LAD disease vs. apical thinning. There is no evidence of reversible ischemia  This is a low risk study.    ASSESSMENT AND PLAN:  1. Coronary artery disease  - known LAD disease and prior to myocardial infarctions, Normal stress test on 06/30/2015. Unchanged symptoms and ECG today, no ischemic workup needed right now. We'll continue aspirin, atorvastatin, lisinopril and metoprolol. Medications are tolerated well.   2. Hyperlipidemia  - on atorvastatin 20 mg daily, her lipid profile in September 2017 all of the lipids were at goal. She is going to follow with Dr. Quay Burow in March and her lipids will be checked at a time we will obtain the results.  3. Hypertension  - uncontrolled today and per patient recently was at the time and 140-150. Will increase Benazepril to 40 mg daily.  4. Hypothyroidism on Synthroid, most recent TSH very low, Dr. Quay Burow is going to repeat the lab in March.  Follow up in 6 months.  Signed, Ena Dawley, MD  06/19/2017 2:27 PM    [facility] [address], [place], [place]  [address] Phone: [phone]; Fax:  [phone]

## 2017-06-20 ENCOUNTER — Other Ambulatory Visit: Payer: Self-pay | Admitting: Internal Medicine

## 2017-06-30 ENCOUNTER — Encounter: Payer: Medicare Other | Admitting: Registered Nurse

## 2017-07-05 ENCOUNTER — Encounter: Payer: Medicare Other | Attending: Physical Medicine & Rehabilitation | Admitting: Registered Nurse

## 2017-07-05 ENCOUNTER — Encounter: Payer: Self-pay | Admitting: Registered Nurse

## 2017-07-05 VITALS — BP 119/81 | HR 68

## 2017-07-05 DIAGNOSIS — M4126 Other idiopathic scoliosis, lumbar region: Secondary | ICD-10-CM | POA: Insufficient documentation

## 2017-07-05 DIAGNOSIS — M961 Postlaminectomy syndrome, not elsewhere classified: Secondary | ICD-10-CM | POA: Diagnosis not present

## 2017-07-05 DIAGNOSIS — Z79899 Other long term (current) drug therapy: Secondary | ICD-10-CM | POA: Diagnosis not present

## 2017-07-05 DIAGNOSIS — M419 Scoliosis, unspecified: Secondary | ICD-10-CM

## 2017-07-05 DIAGNOSIS — M1611 Unilateral primary osteoarthritis, right hip: Secondary | ICD-10-CM | POA: Diagnosis present

## 2017-07-05 DIAGNOSIS — M24551 Contracture, right hip: Secondary | ICD-10-CM | POA: Diagnosis not present

## 2017-07-05 DIAGNOSIS — G894 Chronic pain syndrome: Secondary | ICD-10-CM | POA: Diagnosis not present

## 2017-07-05 DIAGNOSIS — Z5181 Encounter for therapeutic drug level monitoring: Secondary | ICD-10-CM | POA: Diagnosis not present

## 2017-07-05 DIAGNOSIS — M48061 Spinal stenosis, lumbar region without neurogenic claudication: Secondary | ICD-10-CM | POA: Diagnosis not present

## 2017-07-05 MED ORDER — HYDROCODONE-ACETAMINOPHEN 10-325 MG PO TABS: 1.0000 | ORAL_TABLET | Freq: Three times a day (TID) | ORAL | 0 refills | Status: DC | PRN

## 2017-07-05 NOTE — Progress Notes (Signed)
Subjective:    Patient ID: Hannah Scott, female    DOB: 1951-02-16, 67 y.o.   MRN: [id number]  HPI: Hannah Scott is a 67 year old female who returns for follow up appointmentfor chronic pain and medication refill. She states her pain is located in her lower back. She rates her pain 7. Her current exercise regime is walking.   Hannah Scott Morphine equivalent is 30.00 MME.  Oral Swab was performed on 05/03/2017  it was consistent.  Pain Inventory Average Pain 8 Pain Right Now 7 My pain is constant, burning, tingling and aching  In the last 24 hours, has pain interfered with the following? General activity 10 Relation with others 10 Enjoyment of life 10 What TIME of day is your pain at its worst? daytime evening and night Sleep (in general) Poor  Pain is worse with: walking and standing Pain improves with: rest and medication Relief from Meds: 6  Mobility walk with assistance use a cane use a walker ability to climb steps?  yes do you drive?  yes  Function disabled: date disabled . I need assistance with the following:  household duties  Neuro/Psych weakness numbness tingling trouble walking spasms depression  Prior Studies Any changes since last visit?  no  Physicians involved in your care Any changes since last visit?  no   Family History  Problem Relation Age of Onset  . Heart failure Mother   . Pneumonia Mother   . Hypertension Mother   . Heart disease Mother   . Stroke Maternal Grandfather   . Hypertension Maternal Grandfather   . Heart attack Father   . Heart disease Father   . Asthma Paternal Uncle        PAT UNCLES  . Heart attack Paternal Uncle    Social History   Socioeconomic History  . Marital status: Married    Spouse name: Not on file  . Number of children: Not on file  . Years of education: Not on file  . Highest education level: Not on file  Social Needs  . Financial resource strain: Not on file  . Food insecurity - worry: Not  on file  . Food insecurity - inability: Not on file  . Transportation needs - medical: Not on file  . Transportation needs - non-medical: Not on file  Occupational History  . Not on file  Tobacco Use  . Smoking status: Never Smoker  . Smokeless tobacco: Never Used  Substance and Sexual Activity  . Alcohol use: No    Alcohol/week: 0.0 oz  . Drug use: No  . Sexual activity: No    Birth control/protection: Surgical  Other Topics Concern  . Not on file  Social History Narrative  . Not on file   Past Surgical History:  Procedure Laterality Date  . ANKLE SURGERY Left    ligament  . APPENDECTOMY  1987   AT [facility]  . BACK SURGERY     Fusion  . BREAST LUMPECTOMY  2003   radiation on right  . BREAST LUMPECTOMY WITH RADIOACTIVE SEED LOCALIZATION Left 02/12/2016   Procedure: LEFT BREAST LUMPECTOMY WITH RADIOACTIVE SEED LOCALIZATION;  Surgeon: Excell Seltzer, MD;  Location: [facility];  Service: General;  Laterality: Left;  . CARDIOVASCULAR STRESS TEST  09/07/05   Nuclear, was negative  . CATARACT EXTRACTION Bilateral 01,03  . OOPHORECTOMY     LSO -RSO  . PELVIC LAPAROSCOPY  1989   RSO, LYSIS OF ADHESIONS  .  TOTAL ABDOMINAL HYSTERECTOMY  1987   LSO, APPENDECTOMY  . TOTAL HIP ARTHROPLASTY Right 10/28/2013   dr Lorin Mercy  . TOTAL HIP ARTHROPLASTY Right 10/28/2013   Procedure: TOTAL HIP ARTHROPLASTY ANTERIOR APPROACH;  Surgeon: Marybelle Killings, MD;  Location: [facility];  Service: Orthopedics;  Laterality: Right;  Right Total Hip Arthroplasty, Direct Anterior Approach   Past Medical History:  Diagnosis Date  . Anemia    hx  . Arthritis   . Asthma    PFTs, February, 2011, moderate obstructive disease with response to bronchodilators, normal lung volumes, moderate reduction in diffusing capacity  . Atrial septal aneurysm    Echo, 2008-not noted on 13 echo  . CAD (coronary artery disease)    90% distal LAD in the past  /   nuclear, 2008, no ischemia, ejection fraction 70%  .  Cancer ([place]) 2002   DUCTAL CIS--S/P LUMPECTOMY, RADIATION AND 6 WEEKS OF TAMOXIFEN  . D-dimer, elevated    January, 2014  . Depression   . Ejection fraction    EF 60%, echo, October, 2008  . Elevated CPK    January, 2014  . Endometriosis 1989   RIGHT TUBE  . Endometriosis 1987   LEFT TUBE/OVARY W FOCAL IN-SITU ENDOMETRIAL ADENOCARCINOMA  . GERD (gastroesophageal reflux disease)    occ  . H/O hiatal hernia    ?  [name] Hyperlipidemia   . Hypertension   . Hypothyroidism    Patient has had in the past that she does not need treatment  . Kyphoscoliosis   . Obstructive airway disease ([place])   . Pinched nerve    lower back  . Shortness of breath   . UTI (lower urinary tract infection)    There were no vitals taken for this visit.  Opioid Risk Score:  0 Fall Risk Score:  `1  Depression screen PHQ 2/9  Depression screen [facility] 2/9 06/02/2017 05/03/2017 01/27/2017 01/10/2017 01/02/2017 09/08/2016 01/06/2016  Decreased Interest 1 1 1  0 0 0 0  Down, Depressed, Hopeless 1 1 1 2 1 1 1   PHQ - 2 Score 2 2 2 2 1 1 1   Altered sleeping - - - 1 - - -  Tired, decreased energy - - - 1 - - -  Change in appetite - - - 0 - - -  Feeling bad or failure about yourself  - - - 1 - - -  Trouble concentrating - - - 0 - - -  Moving slowly or fidgety/restless - - - 0 - - -  Suicidal thoughts - - - 0 - - -  PHQ-9 Score - - - 5 - - -  Difficult doing work/chores - - - Not difficult at all - - -  Some recent data might be hidden    Review of Systems  HENT: Negative.   Eyes: Negative.   Respiratory: Negative.   Cardiovascular: Negative.   Gastrointestinal: Negative.   Endocrine: Negative.   Genitourinary: Negative.   Musculoskeletal: Positive for arthralgias, back pain, gait problem and myalgias.  Skin: Negative.   Allergic/Immunologic: Negative.   Neurological: Positive for weakness and numbness.       Tingling  Hematological: Negative.   Psychiatric/Behavioral: Positive for dysphoric mood.  All other  systems reviewed and are negative.      Objective:   Physical Exam  Constitutional: She is oriented to person, place, and time. She appears well-developed and well-nourished.  HENT:  Head: Normocephalic and atraumatic.  Neck: Normal range of motion. Neck supple.  Cardiovascular: Normal rate and regular rhythm.  Pulmonary/Chest: Effort normal and breath sounds normal.  Musculoskeletal:  Normal Muscle Bulk and Muscle Testing Reveals: Upper Extremities: Full ROM and Muscle Strength 5/5 Lumbar Paraspinal Tenderness: L-4-L-5 Lumbar Scoliosis Lower Extremities: Full ROM and Muscle Strength 5/5 Arises from Table Slowly using walker for support Narrow Based Gait  Neurological: She is alert and oriented to person, place, and time.  Skin: Skin is warm and dry.  Psychiatric: She has a normal mood and affect.  Nursing note and vitals reviewed.         Assessment & Plan:  1 Lumbar post laminectomy syndrome, s/p lumbar fusion: 07/05/2017 Refilled: Hydrocodone 10/325mg  one tablet every 6 hours #100. We will continue the opioid monitoring program, this consists of regular clinic visits, examinations, urine drug screen, pill counts as well as use of [place] Controlled Substance Reporting system. 2. Right Hip OA: S/P Right Hip Replacement 10/28/2013. 07/05/2017 3.Depression: Continue Cymbalta. Has Family Support and Friends. Counseling with Doristine Bosworth. 07/05/2017 4. Right Greater Trochanteric Tenderness: No complaints Today. Continue to Monitor. Continue with Ice and Heat Therapy.07/05/2017  15 minutes of face to face patient care time was spent during this visit. All questions were encouraged and answered.  F/U in 1 month

## 2017-07-12 ENCOUNTER — Ambulatory Visit: Payer: Medicare Other | Admitting: Internal Medicine

## 2017-07-12 ENCOUNTER — Encounter: Payer: Self-pay | Admitting: Internal Medicine

## 2017-07-12 ENCOUNTER — Other Ambulatory Visit (INDEPENDENT_AMBULATORY_CARE_PROVIDER_SITE_OTHER): Payer: Medicare Other

## 2017-07-12 VITALS — BP 152/90 | HR 81 | Temp 98.1°F | Resp 16 | Wt 162.0 lb

## 2017-07-12 DIAGNOSIS — I10 Essential (primary) hypertension: Secondary | ICD-10-CM | POA: Diagnosis not present

## 2017-07-12 DIAGNOSIS — I2510 Atherosclerotic heart disease of native coronary artery without angina pectoris: Secondary | ICD-10-CM | POA: Diagnosis not present

## 2017-07-12 DIAGNOSIS — F3289 Other specified depressive episodes: Secondary | ICD-10-CM | POA: Diagnosis not present

## 2017-07-12 DIAGNOSIS — E039 Hypothyroidism, unspecified: Secondary | ICD-10-CM

## 2017-07-12 DIAGNOSIS — J4530 Mild persistent asthma, uncomplicated: Secondary | ICD-10-CM | POA: Diagnosis not present

## 2017-07-12 DIAGNOSIS — E7849 Other hyperlipidemia: Secondary | ICD-10-CM

## 2017-07-12 DIAGNOSIS — M810 Age-related osteoporosis without current pathological fracture: Secondary | ICD-10-CM

## 2017-07-12 DIAGNOSIS — R7303 Prediabetes: Secondary | ICD-10-CM | POA: Diagnosis not present

## 2017-07-12 LAB — CBC WITH DIFFERENTIAL/PLATELET
BASOS PCT: 0.9 % (ref 0.0–3.0)
Basophils Absolute: 0 10*3/uL (ref 0.0–0.1)
EOS PCT: 10.6 % — AB (ref 0.0–5.0)
Eosinophils Absolute: 0.6 10*3/uL (ref 0.0–0.7)
HEMATOCRIT: 38.9 % (ref 36.0–46.0)
HEMOGLOBIN: 12.4 g/dL (ref 12.0–15.0)
LYMPHS PCT: 22.3 % (ref 12.0–46.0)
Lymphs Abs: 1.2 10*3/uL (ref 0.7–4.0)
MCHC: 31.8 g/dL (ref 30.0–36.0)
MCV: 82.8 fl (ref 78.0–100.0)
Monocytes Absolute: 0.4 10*3/uL (ref 0.1–1.0)
Monocytes Relative: 8.1 % (ref 3.0–12.0)
Neutro Abs: 3 10*3/uL (ref 1.4–7.7)
Neutrophils Relative %: 58.1 % (ref 43.0–77.0)
Platelets: 275 10*3/uL (ref 150.0–400.0)
RBC: 4.7 Mil/uL (ref 3.87–5.11)
RDW: 15.6 % — AB (ref 11.5–15.5)
WBC: 5.2 10*3/uL (ref 4.0–10.5)

## 2017-07-12 LAB — TSH: TSH: 8.28 u[IU]/mL — ABNORMAL HIGH (ref 0.35–4.50)

## 2017-07-12 LAB — VITAMIN D 25 HYDROXY (VIT D DEFICIENCY, FRACTURES): VITD: 21.44 ng/mL — ABNORMAL LOW (ref 30.00–100.00)

## 2017-07-12 LAB — HEMOGLOBIN A1C: HEMOGLOBIN A1C: 6 % (ref 4.6–6.5)

## 2017-07-12 NOTE — Assessment & Plan Note (Signed)
Chronic wheezing and shortness of breath-stable Asthma overall controlled Following with pulmonary Continue current inhalers

## 2017-07-12 NOTE — Assessment & Plan Note (Signed)
BP well controlled Current regimen effective and well tolerated Continue current medications at current doses cmp  

## 2017-07-12 NOTE — Patient Instructions (Addendum)

## 2017-07-12 NOTE — Assessment & Plan Note (Signed)
Check lipid panel  Continue daily statin Regular exercise and healthy diet encouraged  

## 2017-07-12 NOTE — Assessment & Plan Note (Signed)
Has been experiencing increased fatigue and some hair falling out She is concerned her thyroid may be off Check tsh  Titrate med dose if needed

## 2017-07-12 NOTE — Assessment & Plan Note (Signed)
Following with cardiology No chest pain or palpitations Has chronic shortness of breath from asthma-unchanged Check CBC, CMP, lipid panel Continue current medications

## 2017-07-12 NOTE — Progress Notes (Signed)
Subjective:    Patient ID: Hannah Scott, female    DOB: 06-05-1950, 67 y.o.   MRN: [id number]  HPI The patient is here for follow up.  CAD, Hypertension: She is taking her medication daily. She is compliant with a low sodium diet.  She denies chest pain, palpitations, edema, increased shortness of breath beyond her normal from her asthma and regular headaches. She is not exercising regularly.  She does monitor her blood pressure at home - typically 130-140's.    Asthma:  She is taking her inhalers.  She does have chronic wheezing and shortness of breath, which is unchanged.  She denies any cough, fever or chills.  She feels her asthma is adequately controlled.  Hypothyroidism:  She is taking her medication daily.  The past 3-4 weeks she has had no energy level.  Her hair is coming out more.    Osteoporosis:  She has declined taking medication in the past.  She had a very low vitamin D level last fall.  She did take the high-dose vitamin D for several weeks and did okay with it.  She did not tolerate the daily vitamin D and stopped it.  She is not able to exercise on a regular basis.  Prediabetes:  She is compliant with a low sugar/carbohydrate diet.  She is not exercising regularly.  Hyperlipidemia: She is taking her medication daily. She is compliant with a low fat/cholesterol diet. She is not exercising regularly. She denies myalgias.   Depression: She is taking her medication daily as prescribed. She denies any side effects from the medication. She feels her depression is fairly controlled and she is happy with her current dose of medication.   Chronic pain in back:  She is taking her pain medication and follows with Dr Marcello Moores for pain management. She does not sleep well and is tired all the time.   Medications and allergies reviewed with patient and updated if appropriate.  Patient Active Problem List   Diagnosis Date Noted  . Osteoporosis 01/07/2017  . Prediabetes 07/07/2016  .  History of breast cancer 01/06/2016  . Intraductal papilloma of left breast 01/06/2016  . Essential hypertension 06/08/2015  . DOE (dyspnea on exertion) 06/08/2015  . Fatigue 05/20/2015  . Back pain 04/22/2015  . Lumbar scoliosis 01/10/2014  . Anemia due to blood loss 11/27/2013  . Elevated CPK   . Multinodular goiter 12/01/2011  . Asthma   . Hypothyroidism   . CAD (coronary artery disease)   . Atrial septal aneurysm   . Hyperlipidemia   . Scoliosis   . Spinal stenosis, lumbar region, with neurogenic claudication 07/01/2011  . Lumbar postlaminectomy syndrome 07/01/2011  . Osteoarthritis of right hip 07/01/2011  . Contracture of right hip 07/01/2011  . Endometriosis   . Depression 12/24/2009  . Migraine headache 05/19/2006    Current Outpatient Medications on File Prior to Visit  Medication Sig Dispense Refill  . albuterol (PROVENTIL HFA;VENTOLIN HFA) 108 (90 Base) MCG/ACT inhaler Inhale 2 puffs into the lungs every 4 (four) hours as needed for wheezing or shortness of breath. 1 Inhaler 0  . atorvastatin (LIPITOR) 20 MG tablet Take 1 tablet (20 mg total) by mouth daily. 90 tablet 3  . benazepril (LOTENSIN) 40 MG tablet Take 1 tablet (40 mg total) by mouth daily. 90 tablet 3  . DULoxetine (CYMBALTA) 60 MG capsule Take 1 capsule (60 mg total) by mouth daily. 90 capsule 3  . fluticasone furoate-vilanterol (BREO ELLIPTA) 100-25 MCG/INH AEPB  Inhale 1 puff into the lungs daily.    [name] gabapentin (NEURONTIN) 400 MG capsule Take 1 capsule (400 mg total) by mouth at bedtime. 30 capsule 5  . HYDROcodone-acetaminophen (NORCO) 10-325 MG tablet Take 1 tablet by mouth every 8 (eight) hours as needed. 100 tablet 0  . ipratropium-albuterol (DUONEB) 0.5-2.5 (3) MG/3ML SOLN Take 3 mLs by nebulization every 4 (four) hours as needed (wheezing or SOB). During exacerbation 360 mL 0  . levothyroxine (SYNTHROID, LEVOTHROID) 50 MCG tablet Take 1 tablet (50 mcg total) by mouth daily. --- Labs needed for  refills. 90 tablet 0  . metoprolol tartrate (LOPRESSOR) 50 MG tablet TAKE ONE TABLET BY MOUTH TWICE DAILY 60 tablet 0   No current facility-administered medications on file prior to visit.     Past Medical History:  Diagnosis Date  . Anemia    hx  . Arthritis   . Asthma    PFTs, February, 2011, moderate obstructive disease with response to bronchodilators, normal lung volumes, moderate reduction in diffusing capacity  . Atrial septal aneurysm    Echo, 2008-not noted on 13 echo  . CAD (coronary artery disease)    90% distal LAD in the past  /   nuclear, 2008, no ischemia, ejection fraction 70%  . Cancer ([place]) 2002   DUCTAL CIS--S/P LUMPECTOMY, RADIATION AND 6 WEEKS OF TAMOXIFEN  . D-dimer, elevated    January, 2014  . Depression   . Ejection fraction    EF 60%, echo, October, 2008  . Elevated CPK    January, 2014  . Endometriosis 1989   RIGHT TUBE  . Endometriosis 1987   LEFT TUBE/OVARY W FOCAL IN-SITU ENDOMETRIAL ADENOCARCINOMA  . GERD (gastroesophageal reflux disease)    occ  . H/O hiatal hernia    ?  [name] Hyperlipidemia   . Hypertension   . Hypothyroidism    Patient has had in the past that she does not need treatment  . Kyphoscoliosis   . Obstructive airway disease ([place])   . Pinched nerve    lower back  . Shortness of breath   . UTI (lower urinary tract infection)     Past Surgical History:  Procedure Laterality Date  . ANKLE SURGERY Left    ligament  . APPENDECTOMY  1987   AT [facility]  . BACK SURGERY     Fusion  . BREAST LUMPECTOMY  2003   radiation on right  . BREAST LUMPECTOMY WITH RADIOACTIVE SEED LOCALIZATION Left 02/12/2016   Procedure: LEFT BREAST LUMPECTOMY WITH RADIOACTIVE SEED LOCALIZATION;  Surgeon: Excell Seltzer, MD;  Location: [facility];  Service: General;  Laterality: Left;  . CARDIOVASCULAR STRESS TEST  09/07/05   Nuclear, was negative  . CATARACT EXTRACTION Bilateral 01,03  . OOPHORECTOMY     LSO -RSO  . PELVIC LAPAROSCOPY   1989   RSO, LYSIS OF ADHESIONS  . TOTAL ABDOMINAL HYSTERECTOMY  1987   LSO, APPENDECTOMY  . TOTAL HIP ARTHROPLASTY Right 10/28/2013   dr Lorin Mercy  . TOTAL HIP ARTHROPLASTY Right 10/28/2013   Procedure: TOTAL HIP ARTHROPLASTY ANTERIOR APPROACH;  Surgeon: Marybelle Killings, MD;  Location: [facility];  Service: Orthopedics;  Laterality: Right;  Right Total Hip Arthroplasty, Direct Anterior Approach    Social History   Socioeconomic History  . Marital status: Married    Spouse name: None  . Number of children: None  . Years of education: None  . Highest education level: None  Social Needs  . Financial resource strain: None  .  Food insecurity - worry: None  . Food insecurity - inability: None  . Transportation needs - medical: None  . Transportation needs - non-medical: None  Occupational History  . None  Tobacco Use  . Smoking status: Never Smoker  . Smokeless tobacco: Never Used  Substance and Sexual Activity  . Alcohol use: No    Alcohol/week: 0.0 oz  . Drug use: No  . Sexual activity: No    Birth control/protection: Surgical  Other Topics Concern  . None  Social History Narrative  . None    Family History  Problem Relation Age of Onset  . Heart failure Mother   . Pneumonia Mother   . Hypertension Mother   . Heart disease Mother   . Stroke Maternal Grandfather   . Hypertension Maternal Grandfather   . Heart attack Father   . Heart disease Father   . Asthma Paternal Uncle        PAT UNCLES  . Heart attack Paternal Uncle     Review of Systems  Constitutional: Positive for fatigue. Negative for chills and fever.  Respiratory: Positive for shortness of breath (at baseline) and wheezing (at baseline). Negative for cough.   Cardiovascular: Negative for chest pain, palpitations and leg swelling.  Gastrointestinal: Negative for abdominal pain.       GERD occ  Skin:       Hair coming out  Neurological: Positive for light-headedness (rare) and headaches (occ). Negative for  dizziness.       Objective:   Vitals:   07/12/17 1517  BP: (!) 152/90  Pulse: 81  Resp: 16  Temp: 98.1 F (36.7 C)  SpO2: 95%   BP Readings from Last 3 Encounters:  07/12/17 (!) 152/90  07/05/17 119/81  06/19/17 140/90   Wt Readings from Last 3 Encounters:  07/12/17 162 lb (73.5 kg)  06/19/17 161 lb (73 kg)  01/10/17 156 lb (70.8 kg)   Body mass index is 31.64 kg/m.   Physical Exam    Constitutional: Appears well-developed and well-nourished. No distress.  HENT:  Head: Normocephalic and atraumatic.  Neck: Neck supple. No tracheal deviation present. No thyromegaly present.  No cervical lymphadenopathy Cardiovascular: Normal rate, regular rhythm and normal heart sounds.   1/6 systolic murmur heard. No carotid bruit .  No edema Pulmonary/Chest: Effort normal and breath sounds normal. No respiratory distress. No has no wheezes. No rales.  Skin: Skin is warm and dry. Not diaphoretic.  Psychiatric: Normal mood and affect. Behavior is normal.      Assessment & Plan:    See Problem List for Assessment and Plan of chronic medical problems.

## 2017-07-12 NOTE — Assessment & Plan Note (Signed)
Deferred any medication at this time Vitamin d level low last time - has taken high dose  - did not tolerated daily otc vitamin d Will recheck level Will retry daily dose,  But if not tolerated may need to consider high dose every 2 weeks

## 2017-07-12 NOTE — Assessment & Plan Note (Signed)
Controlled, stable Continue current dose of medication  

## 2017-07-12 NOTE — Assessment & Plan Note (Signed)
Check a1c Low sugar / carb diet Stressed regular exercise   

## 2017-07-13 LAB — COMPREHENSIVE METABOLIC PANEL
ALBUMIN: 4.4 g/dL (ref 3.5–5.2)
ALT: 11 U/L (ref 0–35)
AST: 20 U/L (ref 0–37)
Alkaline Phosphatase: 80 U/L (ref 39–117)
BUN: 11 mg/dL (ref 6–23)
CALCIUM: 9.8 mg/dL (ref 8.4–10.5)
CHLORIDE: 101 meq/L (ref 96–112)
CO2: 31 mEq/L (ref 19–32)
Creatinine, Ser: 0.63 mg/dL (ref 0.40–1.20)
GFR: 99.9 mL/min (ref >60.00)
Glucose, Bld: 101 mg/dL — ABNORMAL HIGH (ref 70–99)
POTASSIUM: 5 meq/L (ref 3.5–5.1)
Sodium: 140 mEq/L (ref 135–145)
Total Bilirubin: 0.1 mg/dL — ABNORMAL LOW (ref 0.2–1.2)
Total Protein: 6.4 g/dL (ref 6.0–8.3)

## 2017-07-13 LAB — LIPID PANEL
CHOLESTEROL: 152 mg/dL (ref 0–200)
HDL: 70.6 mg/dL (ref >39.00)
LDL Cholesterol: 66 mg/dL (ref 0–99)
NonHDL: 81.34
Total CHOL/HDL Ratio: 2
Triglycerides: 75 mg/dL (ref 0.0–149.0)
VLDL: 15 mg/dL (ref 0.0–40.0)

## 2017-07-14 ENCOUNTER — Other Ambulatory Visit: Payer: Self-pay | Admitting: Emergency Medicine

## 2017-07-14 ENCOUNTER — Other Ambulatory Visit: Payer: Self-pay | Admitting: Internal Medicine

## 2017-07-14 DIAGNOSIS — E039 Hypothyroidism, unspecified: Secondary | ICD-10-CM

## 2017-07-14 MED ORDER — LEVOTHYROXINE SODIUM 75 MCG PO TABS: 75.0000 ug | ORAL_TABLET | Freq: Every day | ORAL | 1 refills | Status: DC

## 2017-07-19 ENCOUNTER — Other Ambulatory Visit: Payer: Self-pay | Admitting: *Deleted

## 2017-07-19 MED ORDER — GABAPENTIN 400 MG PO CAPS: 400.0000 mg | ORAL_CAPSULE | Freq: Every day | ORAL | 5 refills | Status: DC

## 2017-08-09 ENCOUNTER — Encounter: Payer: Medicare Other | Attending: Physical Medicine & Rehabilitation | Admitting: Registered Nurse

## 2017-08-09 ENCOUNTER — Encounter: Payer: Self-pay | Admitting: Registered Nurse

## 2017-08-09 VITALS — BP 110/74 | HR 74 | Ht 59.0 in | Wt 164.0 lb

## 2017-08-09 DIAGNOSIS — Z5181 Encounter for therapeutic drug level monitoring: Secondary | ICD-10-CM

## 2017-08-09 DIAGNOSIS — M419 Scoliosis, unspecified: Secondary | ICD-10-CM

## 2017-08-09 DIAGNOSIS — M1611 Unilateral primary osteoarthritis, right hip: Secondary | ICD-10-CM | POA: Insufficient documentation

## 2017-08-09 DIAGNOSIS — M48061 Spinal stenosis, lumbar region without neurogenic claudication: Secondary | ICD-10-CM | POA: Insufficient documentation

## 2017-08-09 DIAGNOSIS — M24551 Contracture, right hip: Secondary | ICD-10-CM

## 2017-08-09 DIAGNOSIS — M4126 Other idiopathic scoliosis, lumbar region: Secondary | ICD-10-CM | POA: Insufficient documentation

## 2017-08-09 DIAGNOSIS — G894 Chronic pain syndrome: Secondary | ICD-10-CM | POA: Diagnosis not present

## 2017-08-09 DIAGNOSIS — M961 Postlaminectomy syndrome, not elsewhere classified: Secondary | ICD-10-CM | POA: Insufficient documentation

## 2017-08-09 DIAGNOSIS — M5416 Radiculopathy, lumbar region: Secondary | ICD-10-CM | POA: Diagnosis not present

## 2017-08-09 DIAGNOSIS — Z79899 Other long term (current) drug therapy: Secondary | ICD-10-CM

## 2017-08-09 MED ORDER — GABAPENTIN 600 MG PO TABS: 600.0000 mg | ORAL_TABLET | Freq: Every day | ORAL | 3 refills | Status: DC

## 2017-08-09 MED ORDER — HYDROCODONE-ACETAMINOPHEN 10-325 MG PO TABS: 1.0000 | ORAL_TABLET | Freq: Three times a day (TID) | ORAL | 0 refills | Status: DC | PRN

## 2017-08-09 NOTE — Patient Instructions (Signed)
For Your Neuropathic Pain we will Increase Gabapentin dose  to 600 mg at HS

## 2017-08-09 NOTE — Progress Notes (Signed)
Subjective:    Patient ID: Hannah Scott, female    DOB: 1950-06-05, 67 y.o.   MRN: [id number]  HPI: Hannah Scott is a 67 year old female who returns for follow up appointment for chronic pain and medication refill. She states her pain is located in her lower back. She rates her pain 6. Her current exercise regime is walking.   Hannah Scott Morphine Equivalent is 25.49 MME.   Oral Swab was Performed on 05/03/2017 it was consistent.   Pain Inventory Average Pain 7 Pain Right Now 6 My pain is burning, tingling and aching  In the last 24 hours, has pain interfered with the following? General activity 10 Relation with others 10 Enjoyment of life 10 What TIME of day is your pain at its worst? all Sleep (in general) Poor  Pain is worse with: walking and standing Pain improves with: rest and medication Relief from Meds: 4  Mobility walk with assistance use a cane use a walker ability to climb steps?  yes do you drive?  yes  Function disabled: date disabled .  Neuro/Psych weakness numbness tingling trouble walking spasms depression  Prior Studies Any changes since last visit?  no  Physicians involved in your care Any changes since last visit?  no   Family History  Problem Relation Age of Onset  . Heart failure Mother   . Pneumonia Mother   . Hypertension Mother   . Heart disease Mother   . Stroke Maternal Grandfather   . Hypertension Maternal Grandfather   . Heart attack Father   . Heart disease Father   . Asthma Paternal Uncle        PAT UNCLES  . Heart attack Paternal Uncle    Social History   Socioeconomic History  . Marital status: Married    Spouse name: Not on file  . Number of children: Not on file  . Years of education: Not on file  . Highest education level: Not on file  Occupational History  . Not on file  Social Needs  . Financial resource strain: Not on file  . Food insecurity:    Worry: Not on file    Inability: Not on file  .  Transportation needs:    Medical: Not on file    Non-medical: Not on file  Tobacco Use  . Smoking status: Never Smoker  . Smokeless tobacco: Never Used  Substance and Sexual Activity  . Alcohol use: No    Alcohol/week: 0.0 oz  . Drug use: No  . Sexual activity: Never    Birth control/protection: Surgical  Lifestyle  . Physical activity:    Days per week: Not on file    Minutes per session: Not on file  . Stress: Not on file  Relationships  . Social connections:    Talks on phone: Not on file    Gets together: Not on file    Attends religious service: Not on file    Active member of club or organization: Not on file    Attends meetings of clubs or organizations: Not on file    Relationship status: Not on file  Other Topics Concern  . Not on file  Social History Narrative  . Not on file   Past Surgical History:  Procedure Laterality Date  . ANKLE SURGERY Left    ligament  . APPENDECTOMY  1987   AT [facility]  . BACK SURGERY     Fusion  . BREAST LUMPECTOMY  2003  radiation on right  . BREAST LUMPECTOMY WITH RADIOACTIVE SEED LOCALIZATION Left 02/12/2016   Procedure: LEFT BREAST LUMPECTOMY WITH RADIOACTIVE SEED LOCALIZATION;  Surgeon: Excell Seltzer, MD;  Location: [facility];  Service: General;  Laterality: Left;  . CARDIOVASCULAR STRESS TEST  09/07/05   Nuclear, was negative  . CATARACT EXTRACTION Bilateral 01,03  . OOPHORECTOMY     LSO -RSO  . PELVIC LAPAROSCOPY  1989   RSO, LYSIS OF ADHESIONS  . TOTAL ABDOMINAL HYSTERECTOMY  1987   LSO, APPENDECTOMY  . TOTAL HIP ARTHROPLASTY Right 10/28/2013   dr Lorin Mercy  . TOTAL HIP ARTHROPLASTY Right 10/28/2013   Procedure: TOTAL HIP ARTHROPLASTY ANTERIOR APPROACH;  Surgeon: Marybelle Killings, MD;  Location: [facility];  Service: Orthopedics;  Laterality: Right;  Right Total Hip Arthroplasty, Direct Anterior Approach   Past Medical History:  Diagnosis Date  . Anemia    hx  . Arthritis   . Asthma    PFTs, February, 2011,  moderate obstructive disease with response to bronchodilators, normal lung volumes, moderate reduction in diffusing capacity  . Atrial septal aneurysm    Echo, 2008-not noted on 13 echo  . CAD (coronary artery disease)    90% distal LAD in the past  /   nuclear, 2008, no ischemia, ejection fraction 70%  . Cancer ([place]) 2002   DUCTAL CIS--S/P LUMPECTOMY, RADIATION AND 6 WEEKS OF TAMOXIFEN  . D-dimer, elevated    January, 2014  . Depression   . Ejection fraction    EF 60%, echo, October, 2008  . Elevated CPK    January, 2014  . Endometriosis 1989   RIGHT TUBE  . Endometriosis 1987   LEFT TUBE/OVARY W FOCAL IN-SITU ENDOMETRIAL ADENOCARCINOMA  . GERD (gastroesophageal reflux disease)    occ  . H/O hiatal hernia    ?  [name] Hyperlipidemia   . Hypertension   . Hypothyroidism    Patient has had in the past that she does not need treatment  . Kyphoscoliosis   . Obstructive airway disease ([place])   . Pinched nerve    lower back  . Shortness of breath   . UTI (lower urinary tract infection)    There were no vitals taken for this visit.  Opioid Risk Score:   Fall Risk Score:  `1  Depression screen PHQ 2/9  Depression screen [facility] 2/9 06/02/2017 05/03/2017 01/27/2017 01/10/2017 01/02/2017 09/08/2016 01/06/2016  Decreased Interest 1 1 1  0 0 0 0  Down, Depressed, Hopeless 1 1 1 2 1 1 1   PHQ - 2 Score 2 2 2 2 1 1 1   Altered sleeping - - - 1 - - -  Tired, decreased energy - - - 1 - - -  Change in appetite - - - 0 - - -  Feeling bad or failure about yourself  - - - 1 - - -  Trouble concentrating - - - 0 - - -  Moving slowly or fidgety/restless - - - 0 - - -  Suicidal thoughts - - - 0 - - -  PHQ-9 Score - - - 5 - - -  Difficult doing work/chores - - - Not difficult at all - - -  Some recent data might be hidden     Review of Systems  Constitutional: Negative.   HENT: Negative.   Eyes: Negative.   Respiratory: Negative.   Cardiovascular: Negative.   Gastrointestinal: Negative.   Endocrine:  Negative.   Genitourinary: Negative.   Musculoskeletal: Positive for arthralgias,  back pain, gait problem and myalgias.  Skin: Negative.   Allergic/Immunologic: Negative.   Neurological: Positive for weakness and numbness.  Hematological: Negative.   Psychiatric/Behavioral: Negative.   All other systems reviewed and are negative.      Objective:   Physical Exam  Constitutional: She is oriented to person, place, and time. She appears well-developed and well-nourished.  HENT:  Head: Normocephalic and atraumatic.  Neck: Normal range of motion. Neck supple.  Cardiovascular: Normal rate and regular rhythm.  Pulmonary/Chest: Effort normal and breath sounds normal.  Musculoskeletal:  Normal Muscle Bulk and Muscle Testing Reveals: Upper Extremities: Full ROM and Muscle Strength 5/5 Thoracic Paraspinal Tenderness: T-7-T-9 Lumbar Paraspinal Tenderness: L-3-L-5 Lower Extremities: Full ROM and Muscle Strength 5/5 Arises from Table Slowly using walker for support Antalgic gait  Neurological: She is alert and oriented to person, place, and time.  Skin: Skin is warm and dry.  Psychiatric: She has a normal mood and affect.  Nursing note and vitals reviewed.         Assessment & Plan:  1 Lumbar post laminectomy syndrome, s/p lumbar fusion: 08/09/2017 Continue current medication regimen. Refilled: Hydrocodone 10/325mg  one tablet every 6 hours #100. We will continue the opioid monitoring program, this consists of regular clinic visits, examinations, urine drug screen, pill counts as well as use of [place] Controlled Substance Reporting system. 2. Right Hip OA: S/P Right Hip Replacement 10/28/2013. 08/09/2017 3.Depression: Continue Cymbalta. Has Family Support and Friends. Counseling with Doristine Bosworth. 08/09/2017 4. Right Greater Trochanteric Tenderness: No complaints Today. Continue to Monitor. Continue with Ice and Heat Therapy.08/09/2017  15 minutes of face to face patient care time  was spent during this visit. All questions were encouraged and answered.  F/U in 1 month

## 2017-08-29 ENCOUNTER — Other Ambulatory Visit (INDEPENDENT_AMBULATORY_CARE_PROVIDER_SITE_OTHER): Payer: Medicare Other

## 2017-08-29 DIAGNOSIS — E039 Hypothyroidism, unspecified: Secondary | ICD-10-CM

## 2017-08-29 LAB — TSH: TSH: 0.26 u[IU]/mL — ABNORMAL LOW (ref 0.35–4.50)

## 2017-09-06 ENCOUNTER — Encounter: Payer: Self-pay | Admitting: Registered Nurse

## 2017-09-06 ENCOUNTER — Encounter: Payer: Medicare Other | Attending: Physical Medicine & Rehabilitation | Admitting: Registered Nurse

## 2017-09-06 VITALS — BP 139/88 | HR 69 | Ht 59.0 in | Wt 163.0 lb

## 2017-09-06 DIAGNOSIS — M4126 Other idiopathic scoliosis, lumbar region: Secondary | ICD-10-CM | POA: Diagnosis not present

## 2017-09-06 DIAGNOSIS — Z5181 Encounter for therapeutic drug level monitoring: Secondary | ICD-10-CM | POA: Diagnosis not present

## 2017-09-06 DIAGNOSIS — Z79899 Other long term (current) drug therapy: Secondary | ICD-10-CM | POA: Diagnosis not present

## 2017-09-06 DIAGNOSIS — M24551 Contracture, right hip: Secondary | ICD-10-CM

## 2017-09-06 DIAGNOSIS — M1611 Unilateral primary osteoarthritis, right hip: Secondary | ICD-10-CM | POA: Insufficient documentation

## 2017-09-06 DIAGNOSIS — M419 Scoliosis, unspecified: Secondary | ICD-10-CM

## 2017-09-06 DIAGNOSIS — G894 Chronic pain syndrome: Secondary | ICD-10-CM | POA: Diagnosis not present

## 2017-09-06 DIAGNOSIS — M48061 Spinal stenosis, lumbar region without neurogenic claudication: Secondary | ICD-10-CM | POA: Insufficient documentation

## 2017-09-06 DIAGNOSIS — M961 Postlaminectomy syndrome, not elsewhere classified: Secondary | ICD-10-CM

## 2017-09-06 DIAGNOSIS — M7062 Trochanteric bursitis, left hip: Secondary | ICD-10-CM | POA: Diagnosis not present

## 2017-09-06 DIAGNOSIS — M7061 Trochanteric bursitis, right hip: Secondary | ICD-10-CM

## 2017-09-06 MED ORDER — HYDROCODONE-ACETAMINOPHEN 10-325 MG PO TABS: 1.0000 | ORAL_TABLET | Freq: Three times a day (TID) | ORAL | 0 refills | Status: DC | PRN

## 2017-09-06 NOTE — Progress Notes (Signed)
Subjective:    Hannah Scott ID: Hannah Scott, female    DOB: Apr 11, 1951, 67 y.o.   MRN: [id number]  HPI: Hannah Scott is a 67 year old female who returns for follow up appointment for chronic pain and medication refill. She states her pain is located in her lower back and bilateral hips L>R.[name] She rates her pain 7. Her current exercise regime is walking.   Hannah Scott Morphine Equivalent is 30.92 MME.  Last Oral Swab was Performed on 05/03/2017, it was consistent.   Pain Inventory Average Pain 7 Pain Right Now 7 My pain is constant, burning, tingling and aching  In the last 24 hours, has pain interfered with the following? General activity 10 Relation with others 10 Enjoyment of life 10 What TIME of day is your pain at its worst? evening Sleep (in general) Poor  Pain is worse with: walking and standing Pain improves with: rest and medication Relief from Meds: 5  Mobility use a cane use a walker ability to climb steps?  yes do you drive?  yes  Function disabled: date disabled .  Neuro/Psych weakness numbness tingling trouble walking spasms depression  Prior Studies Any changes since last visit?  no  Physicians involved in your care Any changes since last visit?  no   Family History  Problem Relation Age of Onset  . Heart failure Mother   . Pneumonia Mother   . Hypertension Mother   . Heart disease Mother   . Stroke Maternal Grandfather   . Hypertension Maternal Grandfather   . Heart attack Father   . Heart disease Father   . Asthma Paternal Uncle        PAT UNCLES  . Heart attack Paternal Uncle    Social History   Socioeconomic History  . Marital status: Married    Spouse name: Not on file  . Number of children: Not on file  . Years of education: Not on file  . Highest education level: Not on file  Occupational History  . Not on file  Social Needs  . Financial resource strain: Not on file  . Food insecurity:    Worry: Not on file    Inability:  Not on file  . Transportation needs:    Medical: Not on file    Non-medical: Not on file  Tobacco Use  . Smoking status: Never Smoker  . Smokeless tobacco: Never Used  Substance and Sexual Activity  . Alcohol use: No    Alcohol/week: 0.0 oz  . Drug use: No  . Sexual activity: Never    Birth control/protection: Surgical  Lifestyle  . Physical activity:    Days per week: Not on file    Minutes per session: Not on file  . Stress: Not on file  Relationships  . Social connections:    Talks on phone: Not on file    Gets together: Not on file    Attends religious service: Not on file    Active member of club or organization: Not on file    Attends meetings of clubs or organizations: Not on file    Relationship status: Not on file  Other Topics Concern  . Not on file  Social History Narrative  . Not on file   Past Surgical History:  Procedure Laterality Date  . ANKLE SURGERY Left    ligament  . APPENDECTOMY  1987   AT [facility]  . BACK SURGERY     Fusion  . BREAST LUMPECTOMY  2003   radiation on right  . BREAST LUMPECTOMY WITH RADIOACTIVE SEED LOCALIZATION Left 02/12/2016   Procedure: LEFT BREAST LUMPECTOMY WITH RADIOACTIVE SEED LOCALIZATION;  Surgeon: Excell Seltzer, MD;  Location: [facility];  Service: General;  Laterality: Left;  . CARDIOVASCULAR STRESS TEST  09/07/05   Nuclear, was negative  . CATARACT EXTRACTION Bilateral 01,03  . OOPHORECTOMY     LSO -RSO  . PELVIC LAPAROSCOPY  1989   RSO, LYSIS OF ADHESIONS  . TOTAL ABDOMINAL HYSTERECTOMY  1987   LSO, APPENDECTOMY  . TOTAL HIP ARTHROPLASTY Right 10/28/2013   dr Lorin Mercy  . TOTAL HIP ARTHROPLASTY Right 10/28/2013   Procedure: TOTAL HIP ARTHROPLASTY ANTERIOR APPROACH;  Surgeon: Marybelle Killings, MD;  Location: [facility];  Service: Orthopedics;  Laterality: Right;  Right Total Hip Arthroplasty, Direct Anterior Approach   Past Medical History:  Diagnosis Date  . Anemia    hx  . Arthritis   . Asthma    PFTs,  February, 2011, moderate obstructive disease with response to bronchodilators, normal lung volumes, moderate reduction in diffusing capacity  . Atrial septal aneurysm    Echo, 2008-not noted on 13 echo  . CAD (coronary artery disease)    90% distal LAD in the past  /   nuclear, 2008, no ischemia, ejection fraction 70%  . Cancer ([place]) 2002   DUCTAL CIS--S/P LUMPECTOMY, RADIATION AND 6 WEEKS OF TAMOXIFEN  . D-dimer, elevated    January, 2014  . Depression   . Ejection fraction    EF 60%, echo, October, 2008  . Elevated CPK    January, 2014  . Endometriosis 1989   RIGHT TUBE  . Endometriosis 1987   LEFT TUBE/OVARY W FOCAL IN-SITU ENDOMETRIAL ADENOCARCINOMA  . GERD (gastroesophageal reflux disease)    occ  . H/O hiatal hernia    ?  [name] Hyperlipidemia   . Hypertension   . Hypothyroidism    Hannah Scott has had in the past that she does not need treatment  . Kyphoscoliosis   . Obstructive airway disease ([place])   . Pinched nerve    lower back  . Shortness of breath   . UTI (lower urinary tract infection)    BP 139/88   Pulse 69   Ht 4\' 11"  (1.499 m)   Wt 163 lb (73.9 kg)   SpO2 94%   BMI 32.92 kg/m   Opioid Risk Score:   Fall Risk Score:  `1  Depression screen PHQ 2/9  Depression screen [facility] 2/9 06/02/2017 05/03/2017 01/27/2017 01/10/2017 01/02/2017 09/08/2016 01/06/2016  Decreased Interest 1 1 1  0 0 0 0  Down, Depressed, Hopeless 1 1 1 2 1 1 1   PHQ - 2 Score 2 2 2 2 1 1 1   Altered sleeping - - - 1 - - -  Tired, decreased energy - - - 1 - - -  Change in appetite - - - 0 - - -  Feeling bad or failure about yourself  - - - 1 - - -  Trouble concentrating - - - 0 - - -  Moving slowly or fidgety/restless - - - 0 - - -  Suicidal thoughts - - - 0 - - -  PHQ-9 Score - - - 5 - - -  Difficult doing work/chores - - - Not difficult at all - - -  Some recent data might be hidden     Review of Systems  Constitutional: Negative.   HENT: Negative.   Eyes: Negative.  Respiratory: Negative.    Cardiovascular: Negative.   Gastrointestinal: Negative.   Endocrine: Negative.   Genitourinary: Negative.   Musculoskeletal: Positive for arthralgias, back pain, gait problem and myalgias.  Skin: Negative.   Allergic/Immunologic: Negative.   Neurological: Positive for weakness and numbness.  Hematological: Negative.   Psychiatric/Behavioral: Positive for dysphoric mood.  All other systems reviewed and are negative.      Objective:   Physical Exam  Constitutional: She is oriented to person, place, and time. She appears well-developed and well-nourished.  HENT:  Head: Normocephalic and atraumatic.  Neck: Normal range of motion. Neck supple.  Cardiovascular: Normal rate and regular rhythm.  Pulmonary/Chest: Effort normal and breath sounds normal.  Musculoskeletal:  Normal Muscle Bulk and Muscle Testing Reveals: Upper Extremities: Full ROM and Muscle Strength 5/5 Lumbar Paraspinal Tenderness: L-4-L-5 Lower Extremities: Full ROM and Muscle Strength 5/5 Arises from Table Slowly  Using walker for Support Narrow Based Gait  Neurological: She is alert and oriented to person, place, and time.  Skin: Skin is warm and dry.  Psychiatric: She has a normal mood and affect.  Nursing note and vitals reviewed.         Assessment & Plan:  1 Lumbar post laminectomy syndrome, s/p lumbar fusion: 09/06/2017 Continue current medication regimen. Refilled: Hydrocodone 10/325mg  one tablet every 6 hours #100. We will continue the opioid monitoring program, this consists of regular clinic visits, examinations, urine drug screen, pill counts as well as use of [place] Controlled Substance Reporting system. 2. Right Hip OA: S/P Right Hip Replacement 10/28/2013. 09/06/2017 3.Depression: Continue Cymbalta. Has Family Support and Friends. Counseling with Doristine Bosworth. 09/06/2017 4. Bilateral Greater Trochanteric Tenderness L>R:  Continue to Monitor. Continue with Ice and Heat  Therapy.09/06/2017  15 minutes of face to face Hannah Scott care time was spent during this visit. All questions were encouraged and answered.  F/U in 1 month

## 2017-09-08 ENCOUNTER — Ambulatory Visit: Payer: Medicare Other | Admitting: Cardiology

## 2017-09-08 ENCOUNTER — Encounter

## 2017-09-22 ENCOUNTER — Encounter: Payer: Self-pay | Admitting: Internal Medicine

## 2017-10-06 ENCOUNTER — Ambulatory Visit: Payer: Medicare Other

## 2017-10-06 ENCOUNTER — Ambulatory Visit: Payer: Medicare Other | Admitting: Physical Medicine & Rehabilitation

## 2017-10-12 ENCOUNTER — Encounter: Payer: Self-pay | Admitting: Physical Medicine & Rehabilitation

## 2017-10-12 ENCOUNTER — Ambulatory Visit: Payer: Medicare Other | Admitting: Physical Medicine & Rehabilitation

## 2017-10-12 ENCOUNTER — Encounter: Payer: Medicare Other | Attending: Physical Medicine & Rehabilitation

## 2017-10-12 VITALS — BP 127/83 | HR 78 | Ht 60.0 in | Wt 160.0 lb

## 2017-10-12 DIAGNOSIS — M24551 Contracture, right hip: Secondary | ICD-10-CM

## 2017-10-12 DIAGNOSIS — Z79899 Other long term (current) drug therapy: Secondary | ICD-10-CM

## 2017-10-12 DIAGNOSIS — M961 Postlaminectomy syndrome, not elsewhere classified: Secondary | ICD-10-CM | POA: Diagnosis not present

## 2017-10-12 DIAGNOSIS — M48061 Spinal stenosis, lumbar region without neurogenic claudication: Secondary | ICD-10-CM | POA: Diagnosis not present

## 2017-10-12 DIAGNOSIS — M4126 Other idiopathic scoliosis, lumbar region: Secondary | ICD-10-CM | POA: Insufficient documentation

## 2017-10-12 DIAGNOSIS — Z5181 Encounter for therapeutic drug level monitoring: Secondary | ICD-10-CM

## 2017-10-12 DIAGNOSIS — M419 Scoliosis, unspecified: Secondary | ICD-10-CM

## 2017-10-12 DIAGNOSIS — M1611 Unilateral primary osteoarthritis, right hip: Secondary | ICD-10-CM | POA: Insufficient documentation

## 2017-10-12 MED ORDER — HYDROCODONE-ACETAMINOPHEN 10-325 MG PO TABS: 1.0000 | ORAL_TABLET | Freq: Three times a day (TID) | ORAL | 0 refills | Status: DC | PRN

## 2017-10-12 NOTE — Progress Notes (Signed)
Subjective:    Patient ID: Hannah Scott, female    DOB: Apr 29, 1950, 67 y.o.   MRN: [id number]  HPI Hx Lumbar fusion L4-5 ~2008 as well as Right THA 4076 67 year old female with primary complaints of low back and bilateral hip pain.  She underwent right total hip arthroplasty for hip pain but this only helped her pain partially.  She has developed some increasing pain in the left hip.  She does not feel like she is ready to go to see her orthopedist to discuss left hip replacement. She denies any side effects from her pain medication such as constipation poor balance or lethargy. Pain Inventory Average Pain 6 Pain Right Now 5 My pain is constant, burning, tingling and aching  In the last 24 hours, has pain interfered with the following? General activity 10 Relation with others 10 Enjoyment of life 10 What TIME of day is your pain at its worst? all Sleep (in general) Poor  Pain is worse with: walking and standing Pain improves with: rest and medication Relief from Meds: 4  Mobility use a cane use a walker ability to climb steps?  yes do you drive?  yes  Function disabled: date disabled .  Neuro/Psych weakness numbness tingling trouble walking depression  Prior Studies Any changes since last visit?  no  Physicians involved in your care Any changes since last visit?  no   Family History  Problem Relation Age of Onset  . Heart failure Mother   . Pneumonia Mother   . Hypertension Mother   . Heart disease Mother   . Stroke Maternal Grandfather   . Hypertension Maternal Grandfather   . Heart attack Father   . Heart disease Father   . Asthma Paternal Uncle        PAT UNCLES  . Heart attack Paternal Uncle    Social History   Socioeconomic History  . Marital status: Married    Spouse name: Not on file  . Number of children: Not on file  . Years of education: Not on file  . Highest education level: Not on file  Occupational History  . Not on file  Social  Needs  . Financial resource strain: Not on file  . Food insecurity:    Worry: Not on file    Inability: Not on file  . Transportation needs:    Medical: Not on file    Non-medical: Not on file  Tobacco Use  . Smoking status: Never Smoker  . Smokeless tobacco: Never Used  Substance and Sexual Activity  . Alcohol use: No    Alcohol/week: 0.0 oz  . Drug use: No  . Sexual activity: Never    Birth control/protection: Surgical  Lifestyle  . Physical activity:    Days per week: Not on file    Minutes per session: Not on file  . Stress: Not on file  Relationships  . Social connections:    Talks on phone: Not on file    Gets together: Not on file    Attends religious service: Not on file    Active member of club or organization: Not on file    Attends meetings of clubs or organizations: Not on file    Relationship status: Not on file  Other Topics Concern  . Not on file  Social History Narrative  . Not on file   Past Surgical History:  Procedure Laterality Date  . ANKLE SURGERY Left    ligament  . APPENDECTOMY  1987  AT [facility]  . BACK SURGERY     Fusion  . BREAST LUMPECTOMY  2003   radiation on right  . BREAST LUMPECTOMY WITH RADIOACTIVE SEED LOCALIZATION Left 02/12/2016   Procedure: LEFT BREAST LUMPECTOMY WITH RADIOACTIVE SEED LOCALIZATION;  Surgeon: Excell Seltzer, MD;  Location: [facility];  Service: General;  Laterality: Left;  . CARDIOVASCULAR STRESS TEST  09/07/05   Nuclear, was negative  . CATARACT EXTRACTION Bilateral 01,03  . OOPHORECTOMY     [facility]O -RSO  . PELVIC LAPAROSCOPY  1989   RSO, LYSIS OF ADHESIONS  . TOTAL ABDOMINAL HYSTERECTOMY  1987   LSO, APPENDECTOMY  . TOTAL HIP ARTHROPLASTY Right 10/28/2013   dr Lorin Mercy  . TOTAL HIP ARTHROPLASTY Right 10/28/2013   Procedure: TOTAL HIP ARTHROPLASTY ANTERIOR APPROACH;  Surgeon: Marybelle Killings, MD;  Location: [facility];  Service: Orthopedics;  Laterality: Right;  Right Total Hip Arthroplasty, Direct Anterior  Approach   Past Medical History:  Diagnosis Date  . Anemia    hx  . Arthritis   . Asthma    PFTs, February, 2011, moderate obstructive disease with response to bronchodilators, normal lung volumes, moderate reduction in diffusing capacity  . Atrial septal aneurysm    Echo, 2008-not noted on 13 echo  . CAD (coronary artery disease)    90% distal LAD in the past  /   nuclear, 2008, no ischemia, ejection fraction 70%  . Cancer ([place]) 2002   DUCTAL CIS--S/P LUMPECTOMY, RADIATION AND 6 WEEKS OF TAMOXIFEN  . D-dimer, elevated    January, 2014  . Depression   . Ejection fraction    EF 60%, echo, October, 2008  . Elevated CPK    January, 2014  . Endometriosis 1989   RIGHT TUBE  . Endometriosis 1987   LEFT TUBE/OVARY W FOCAL IN-SITU ENDOMETRIAL ADENOCARCINOMA  . GERD (gastroesophageal reflux disease)    occ  . H/O hiatal hernia    ?  [name] Hyperlipidemia   . Hypertension   . Hypothyroidism    Patient has had in the past that she does not need treatment  . Kyphoscoliosis   . Obstructive airway disease ([place])   . Pinched nerve    lower back  . Shortness of breath   . UTI (lower urinary tract infection)    Ht 5' (1.524 m)   Wt 160 lb (72.6 kg)   BMI 31.25 kg/m   Opioid Risk Score:   Fall Risk Score:  `1  Depression screen PHQ 2/9  Depression screen [facility] 2/9 06/02/2017 05/03/2017 01/27/2017 01/10/2017 01/02/2017 09/08/2016 01/06/2016  Decreased Interest 1 1 1  0 0 0 0  Down, Depressed, Hopeless 1 1 1 2 1 1 1   PHQ - 2 Score 2 2 2 2 1 1 1   Altered sleeping - - - 1 - - -  Tired, decreased energy - - - 1 - - -  Change in appetite - - - 0 - - -  Feeling bad or failure about yourself  - - - 1 - - -  Trouble concentrating - - - 0 - - -  Moving slowly or fidgety/restless - - - 0 - - -  Suicidal thoughts - - - 0 - - -  PHQ-9 Score - - - 5 - - -  Difficult doing work/chores - - - Not difficult at all - - -  Some recent data might be hidden     Review of Systems  Constitutional: Negative.    HENT: Negative.  Eyes: Negative.   Respiratory: Negative.   Cardiovascular: Negative.   Gastrointestinal: Negative.   Endocrine: Negative.   Genitourinary: Negative.   Musculoskeletal: Positive for arthralgias, back pain, gait problem and myalgias.  Skin: Negative.   Allergic/Immunologic: Negative.   Neurological: Positive for weakness and numbness.  Hematological: Negative.   Psychiatric/Behavioral: Positive for dysphoric mood.  All other systems reviewed and are negative.      Objective:   Physical Exam  Constitutional: She is oriented to person, place, and time. She appears well-developed and well-nourished. No distress.  HENT:  Head: Normocephalic and atraumatic.  Neurological: She is alert and oriented to person, place, and time.  Skin: Skin is warm and dry. She is not diaphoretic.  Psychiatric: She has a normal mood and affect.  Nursing note and vitals reviewed.  Severe kyphoscoliosis with dextro convex curvature  Decreased Left hip internal rotation Reduced right hip extension she gets to -5 from neutral Get a straight leg raise Lumbar flexion is full, lumbar extension is less than 25% of normal, lumbar lateral bending less than 25% of normal bilaterally. Relates with a walker no evidence of toe drag or knee instability.     Assessment & Plan:  1.  Lumbar postlaminectomy syndrome with severe kyphoscoliosis thoracolumbar spine. Continue hydrocodone 10/325 1 tablet 3 times a day, on up to 10 days/month she can use an extra tablet Patient has some radicular symptoms only at night well managed with gabapentin 600 mill grams nightly  PMP aware website reviewed Last drug toxicology was performed 05/03/2017 was appropriate will repeat today Review controlled substance agreement and opioid consent in 1 month  2.  Left hip osteoarthritis, this is not limiting ambulation.  Has some diminished internal rotation.  We will continue to monitor.  At this point will not refer  back to orthopedics

## 2017-10-12 NOTE — Patient Instructions (Addendum)
Buprenorphine transdermal patch What is this medicine? BUPRENORPHINE (byoo pre NOR feen) is a pain reliever. It is used to treat moderate to severe pain. This medicine may be used for other purposes; ask your health care provider or pharmacist if you have questions. COMMON BRAND NAME(S): Butrans What should I tell my health care provider before I take this medicine? They need to know if you have any of these conditions: -blockage in your bowel -brain tumor -drink more than 3 alcohol-containing drinks per day -drug abuse or addiction -fever -head injury -kidney disease -liver disease -lung or breathing disease, like asthma -thyroid disease -trouble passing urine or change in the amount of urine -an unusual or allergic reaction to buprenorphine, other medicines, foods, dyes, or preservatives -pregnant or trying to get pregnant -breast-feeding How should I use this medicine? Apply the patch to your skin. Do not cut or damage the patch. A cut or damaged patch can be very dangerous because you may get too much medicine. Select a clean, dry area of skin on your upper outer arm, upper chest, upper back, or the side of the chest. Do not apply the patch to broken, burned, cut, or irritated skin. Use only water to clean the area. Do not use soap or alcohol to clean the skin because this can increase the effects of the medicine. If the area is hairy, clip the hair with scissors, but do not shave. Take the patch out of its wrapper. Bend the patch along the faint line and slowly peel the outer portion of the liner, which covers the sticky surface of the patch. Press the patch onto the skin and slowly peel off the protective liner. Do not use a patch if the packaging or backing is damaged. Do not touch the sticky part with your fingers. Press the patch to the skin using the palm of your hand. Press the patch to the skin for 15 seconds. Wash your hands at once. Keep patches far away from children. Do not  let children see you apply the patch and do not apply it where children can see it. Do not call the patch a sticker, tattoo, or bandage, as this could encourage the child to mimic your actions. Used patches still contain medicine. Children or pets can have serious side effects or die from putting used patches in their mouth or on their bodies. Take off the old patch before putting on a new patch. Apply each new patch to a different area of skin. If a patch comes off or causes irritation, remove it and apply a new patch to a different site. If the edges of the patch start to loosen, first apply first aid tape to the edges of the patch. If problems with the patch not sticking continue, cover the patch with a see-through adhesive dressing (like Bioclusive or Tegaderm). Never cover the patch with any other bandage or tape. To get rid of used patches, fold the patch in half with the sticky sides together. Then, flush it down the toilet. Alternately, you may dispose of the patch in the Patch-Disposal Unit provided. Never throw the patch away in the trash without sealing it in the Patch-Disposal unit. Replace the patch every 7 days. Follow the directions on the prescription label. Do not use more medicine than you are told to use. A special MedGuide will be given to you by the pharmacist with each prescription and refill. Be sure to read this information carefully each time. Talk to your pediatrician regarding  the use of this medicine in children. Special care may be needed. If a patch accidentally touches the skin, use only water to clean the area. Do not use soap or alcohol to clean the skin because this can increase the effects of the medicine. If someone accidentally uses a buprenorphine patch and is not awake and alert, immediately call 911 for help. If the person is awake and alert, call a doctor, health care professional, or the [employer]. Overdosage: If you think you have taken too much of this  medicine contact a poison control center or emergency room at once. NOTE: This medicine is only for you. Do not share this medicine with others. What if I miss a dose? If you forget to replace your patch, take off the old patch and put on a new patch as soon as you can. Do not apply an extra patch to your skin. Do not wear more than one patch at the same time unless told to do so by your doctor or health care professional. What may interact with this medicine? Do not take this medication with any of the following medicines: -cisapride -certain medicines for fungal infections like ketoconazole and itraconazole -dofetilide -dronedarone -pimozide -ritonavir -thioridazine -ziprasidone This medicine may interact with the following medications: -alcohol -antihistamines for allergy, cough and cold -antiviral medicines for HIV or AIDS -atropine -certain antibiotics like clarithromycin, erythromycin, linezold, rifampin -certain medicines for anxiety or sleep -certain medicines for bladder problems like oxybutynin, tolterodine -certain medicines for depression like amitriptyline, fluoxetine, sertraline -certain medicines for migraine headache like almotriptan, eletriptan, frovatriptan, naratriptan, rizatriptan, sumatriptan, zolmitriptan -certain medicines for nausea or vomiting like dolasetron, ondansetron, palonosetron -certain medicines for Parkinson's disease like benztropine, trihexyphenidyl -certain medicines for seizures like phenobarbital, primidone -certain medicines for stomach problems like cimetidine, dicyclomine, hyoscyamine -certain medicines for travel sickness like scopolamine -diuretics -general anesthetics like halothane, isoflurane, methoxyflurane, propofol -ipratropium -local anesthetics like lidocaine, pramoxine, tetracaine -MAOIs like Carbex, Eldepryl, Marplan, Nardil, and Parnate -medicines that relax muscles for surgery -methylene blue -other medicines that prolong  the QT interval (cause an abnormal heart rhythm) -other narcotic medicines for pain or cough -phenothiazines like chlorpromazine, mesoridazine, prochlorperazine, thioridazine This list may not describe all possible interactions. Give your health care provider a list of all the medicines, herbs, non-prescription drugs, or dietary supplements you use. Also tell them if you smoke, drink alcohol, or use illegal drugs. Some items may interact with your medicine. What should I watch for while using this medicine? Other pain medicine may be needed when you first start using the patch because the patch can take some time to start working. Tell your doctor or health care professional if your pain does not go away, if it gets worse, or if you have new or a different type of pain. You may develop tolerance to the medicine. Tolerance means that you will need a higher dose of the medicine for pain relief. Tolerance is normal and is expected if you take the medicine for a long time. Do not suddenly stop taking your medicine because you may develop a severe reaction. Your body becomes used to the medicine. This does NOT mean you are addicted. Addiction is a behavior related to getting and using a drug for a non-medical reason. If you have pain, you have a medical reason to take pain medicine. Your doctor will tell you how much medicine to take. If your doctor wants you to stop the medicine, the dose will be slowly lowered over  time to avoid any side effects. If you are also taking a narcotic medicine for pain or cough or another medicine that also causes drowsiness, you may have more side effects. Give your health care provider a list of all medicines you use. Your doctor will tell you how much medicine to take. Do not take more medicine than directed. Call emergency for help if you have problems breathing or unusual sleepiness. You may get drowsy or dizzy. Do not drive, use machinery, or do anything that needs mental  alertness until you know how this medicine affects you. Do not stand or sit up quickly, especially if you are an older patient. This reduces the risk of dizzy or fainting spells. Alcohol may interfere with the effect of this medicine. Avoid alcoholic drinks. The medicine will cause constipation. Try to have a bowel movement at least every 2 to 3 days. If you do not have a bowel movement for 3 days, call your doctor or health care professional. Your mouth may get dry. Chewing sugarless gum or sucking hard candy, and drinking plenty of water may help. Contact your doctor if the problem does not go away or is severe. This medicine patch is sensitive to certain body heat changes. If your skin gets too hot, more medicine will come out of the patch and can cause a deadly overdose. Call your healthcare provider if you get a fever. Do not take hot baths. Do not sunbathe. Do not use hot tubs, saunas, hair dryers, heating pads, electric blankets, heated waterbeds, or tanning lamps. Do not do exercise that increases your body temperature. What side effects may I notice from receiving this medicine? Side effects that you should report to your doctor or health care professional as soon as possible: -allergic reactions like skin rash, itching or hives, swelling of the face, lips, or tongue -breathing problems -confusion -signs and symptoms of a dangerous change in heartbeat or heart rhythm like chest pain; dizziness; fast or irregular heartbeat; palpitations; feeling faint or lightheaded, falls; breathing problems -signs and symptoms of liver injury like dark yellow or brown urine; general ill feeling or flu-like symptoms; light-colored stools; loss of appetite; nausea; right upper belly pain; unusually weak or tired; yellow of the eyes or skin -signs and symptoms of low blood pressure like dizziness; feeling faint or lightheaded, falls; unusually weak or tired -trouble passing urine or change in the amount of  urine Side effects that usually do not require medical attention (report to your doctor or health care professional if they continue or are bothersome): -constipation -dry mouth -itching, redness, or rash at the patch site -nausea, vomiting -tiredness This list may not describe all possible side effects. Call your doctor for medical advice about side effects. You may report side effects to FDA at 1-800-FDA-1088. Where should I keep my medicine? Keep out of the reach of children. This medicine can be abused. Keep your medicine in a safe place to protect it from theft. Do not share this medicine with anyone. Selling or giving away this medicine is dangerous and against the law. Store at room temperature between 59 and 86 degrees F (15 and 30 degrees C). Do not store the patches out of their wrappers. This medicine may cause accidental overdose and death if it is taken by other adults, children, or pets. Flush any unused medicine down the toilet as instructed above to reduce the chance of harm. Alternately, you may dispose of the patch in the Patch-Disposal Unit provided. Do not  use the medicine after the expiration date. NOTE: This sheet is a summary. It may not cover all possible information. If you have questions about this medicine, talk to your doctor, pharmacist, or health care provider.  2018 Elsevier/Gold Standard (2015-11-25 14:17:49)   Would only need 4 clinic visits per year

## 2017-10-17 LAB — TOXASSURE SELECT,+ANTIDEPR,UR

## 2017-10-20 ENCOUNTER — Telehealth: Payer: Self-pay | Admitting: *Deleted

## 2017-10-20 NOTE — Telephone Encounter (Signed)
Urine drug screen for this encounter is consistent for prescribed medication 

## 2017-11-06 ENCOUNTER — Ambulatory Visit: Payer: Medicare Other | Admitting: Family

## 2017-11-06 ENCOUNTER — Encounter: Payer: Self-pay | Admitting: Family

## 2017-11-06 ENCOUNTER — Other Ambulatory Visit (INDEPENDENT_AMBULATORY_CARE_PROVIDER_SITE_OTHER): Payer: Medicare Other

## 2017-11-06 VITALS — BP 142/84 | HR 88 | Temp 98.2°F | Ht 60.0 in | Wt 167.1 lb

## 2017-11-06 DIAGNOSIS — J45998 Other asthma: Secondary | ICD-10-CM | POA: Diagnosis not present

## 2017-11-06 DIAGNOSIS — E039 Hypothyroidism, unspecified: Secondary | ICD-10-CM

## 2017-11-06 DIAGNOSIS — J209 Acute bronchitis, unspecified: Secondary | ICD-10-CM

## 2017-11-06 LAB — TSH: TSH: 0.76 u[IU]/mL (ref 0.35–4.50)

## 2017-11-06 MED ORDER — METHYLPREDNISOLONE ACETATE 80 MG/ML IJ SUSP
80.0000 mg | Freq: Once | INTRAMUSCULAR | Status: AC
  Administered 2017-11-06: 80 mg via INTRAMUSCULAR

## 2017-11-06 MED ORDER — IPRATROPIUM-ALBUTEROL 0.5-2.5 (3) MG/3ML IN SOLN
3.0000 mL | Freq: Once | RESPIRATORY_TRACT | Status: AC
  Administered 2017-11-06: 3 mL via RESPIRATORY_TRACT

## 2017-11-06 MED ORDER — AMOXICILLIN-POT CLAVULANATE 875-125 MG PO TABS: 1.0000 | ORAL_TABLET | Freq: Two times a day (BID) | ORAL | 0 refills | Status: DC

## 2017-11-06 MED ORDER — METHYLPREDNISOLONE 4 MG PO TBPK: ORAL_TABLET | ORAL | 0 refills | Status: DC

## 2017-11-06 NOTE — Patient Instructions (Signed)
Start your oral steroids tomorrow; please start your antibiotics today; please try to take your [facility] everyday;

## 2017-11-06 NOTE — Progress Notes (Signed)
Hannah Scott is a 67 y.o. female with the following history as recorded in [employer]:  Patient Active Problem List   Diagnosis Date Noted  . Osteoporosis 01/07/2017  . Prediabetes 07/07/2016  . History of breast cancer 01/06/2016  . Intraductal papilloma of left breast 01/06/2016  . Essential hypertension 06/08/2015  . DOE (dyspnea on exertion) 06/08/2015  . Fatigue 05/20/2015  . Back pain 04/22/2015  . Lumbar scoliosis 01/10/2014  . Anemia due to blood loss 11/27/2013  . Elevated CPK   . Multinodular goiter 12/01/2011  . Asthma   . Hypothyroidism   . CAD (coronary artery disease)   . Atrial septal aneurysm   . Hyperlipidemia   . Scoliosis   . Spinal stenosis, lumbar region, with neurogenic claudication 07/01/2011  . Lumbar postlaminectomy syndrome 07/01/2011  . Osteoarthritis of right hip 07/01/2011  . Contracture of right hip 07/01/2011  . Endometriosis   . Depression 12/24/2009  . Migraine headache 05/19/2006    Current Outpatient Medications  Medication Sig Dispense Refill  . albuterol (PROVENTIL HFA;VENTOLIN HFA) 108 (90 Base) MCG/ACT inhaler Inhale 2 puffs into the lungs every 4 (four) hours as needed for wheezing or shortness of breath. 1 Inhaler 0  . atorvastatin (LIPITOR) 20 MG tablet Take 1 tablet (20 mg total) by mouth daily. 90 tablet 3  . benazepril (LOTENSIN) 40 MG tablet Take 1 tablet (40 mg total) by mouth daily. 90 tablet 3  . DULoxetine (CYMBALTA) 60 MG capsule Take 1 capsule (60 mg total) by mouth daily. 90 capsule 3  . fluticasone furoate-vilanterol (BREO ELLIPTA) 100-25 MCG/INH AEPB Inhale 1 puff into the lungs daily.    [name] gabapentin (NEURONTIN) 600 MG tablet Take 1 tablet (600 mg total) by mouth at bedtime. 30 tablet 3  . HYDROcodone-acetaminophen (NORCO) 10-325 MG tablet Take 1 tablet by mouth every 8 (eight) hours as needed. 100 tablet 0  . ipratropium-albuterol (DUONEB) 0.5-2.5 (3) MG/3ML SOLN Take 3 mLs by nebulization every 4 (four) hours as needed  (wheezing or SOB). During exacerbation 360 mL 0  . levothyroxine (SYNTHROID, LEVOTHROID) 75 MCG tablet Take 1 tablet (75 mcg total) by mouth daily. 90 tablet 1  . metoprolol tartrate (LOPRESSOR) 50 MG tablet TAKE ONE TABLET BY MOUTH TWICE DAILY 180 tablet 3  . amoxicillin-clavulanate (AUGMENTIN) 875-125 MG tablet Take 1 tablet by mouth every 12 (twelve) hours for 10 days. Take with meal 20 tablet 0  . methylPREDNISolone (MEDROL DOSEPAK) 4 MG TBPK tablet Taper as directed 21 tablet 0   No current facility-administered medications for this visit.     Allergies: Fluticasone-salmeterol; Norvasc [amlodipine besylate]; and Tape  Past Medical History:  Diagnosis Date  . Anemia    hx  . Arthritis   . Asthma    PFTs, February, 2011, moderate obstructive disease with response to bronchodilators, normal lung volumes, moderate reduction in diffusing capacity  . Atrial septal aneurysm    Echo, 2008-not noted on 13 echo  . CAD (coronary artery disease)    90% distal LAD in the past  /   nuclear, 2008, no ischemia, ejection fraction 70%  . Cancer ([place]) 2002   DUCTAL CIS--S/P LUMPECTOMY, RADIATION AND 6 WEEKS OF TAMOXIFEN  . D-dimer, elevated    January, 2014  . Depression   . Ejection fraction    EF 60%, echo, October, 2008  . Elevated CPK    January, 2014  . Endometriosis 1989   RIGHT TUBE  . Endometriosis 1987   LEFT TUBE/OVARY W FOCAL  IN-SITU ENDOMETRIAL ADENOCARCINOMA  . GERD (gastroesophageal reflux disease)    occ  . H/O hiatal hernia    ?  [name] Hyperlipidemia   . Hypertension   . Hypothyroidism    Patient has had in the past that she does not need treatment  . Kyphoscoliosis   . Obstructive airway disease ([place])   . Pinched nerve    lower back  . Shortness of breath   . UTI (lower urinary tract infection)     Past Surgical History:  Procedure Laterality Date  . ANKLE SURGERY Left    ligament  . APPENDECTOMY  1987   AT [facility]  . BACK SURGERY     F[facility]  . BREAST LUMPECTOMY   2003   radiation on right  . BREAST LUMPECTOMY WITH RADIOACTIVE SEED LOCALIZATION Left 02/12/2016   Procedure: LEFT BREAST LUMPECTOMY WITH RADIOACTIVE SEED LOCALIZATION;  Surgeon: Excell Seltzer, MD;  Location: [facility];  Service: General;  Laterality: Left;  . CARDIOVASCULAR STRESS TEST  09/07/05   Nuclear, was negative  . CATARACT EXTRACTION Bilateral 01,03  . OOPHORECTOMY     LSO -RSO  . PELVIC LAPAROSCOPY  1989   RSO, LYSIS OF ADHESIONS  . TOTAL ABDOMINAL HYSTERECTOMY  1987   LSO, APPENDECTOMY  . TOTAL HIP ARTHROPLASTY Right 10/28/2013   dr Lorin Mercy  . TOTAL HIP ARTHROPLASTY Right 10/28/2013   Procedure: TOTAL HIP ARTHROPLASTY ANTERIOR APPROACH;  Surgeon: Marybelle Killings, MD;  Location: [facility];  Service: Orthopedics;  Laterality: Right;  Right Total Hip Arthroplasty, Direct Anterior Approach    Family History  Problem Relation Age of Onset  . Heart failure Mother   . Pneumonia Mother   . Hypertension Mother   . Heart disease Mother   . Stroke Maternal Grandfather   . Hypertension Maternal Grandfather   . Heart attack Father   . Heart disease Father   . Asthma Paternal Uncle        PAT UNCLES  . Heart attack Paternal Uncle     Social History   Tobacco Use  . Smoking status: Never Smoker  . Smokeless tobacco: Never Used  Substance Use Topics  . Alcohol use: No    Alcohol/week: 0.0 oz    Subjective:  Patient presents with concerns for asthma flare for the past week- symptoms have gotten much worse this past weekend; admits that she is not taking her BREO everyday due to cost issues; having to use her nebulizer every 4 hours for the past 4-5 days; + nighttime symptoms; + productive cough; is concerned that she has an underlying infection as well; Also requesting new nebulizer machine if possible- feels that hers is not working as well recently; Also requesting to get her TSH re-checked- her PCP wanted to check it in early July.     Objective:  Vitals:    11/06/17 1352  BP: (!) 142/84  Pulse: 88  Temp: 98.2 F (36.8 C)  TempSrc: Oral  SpO2: 95%  Weight: 167 lb 1.3 oz (75.8 kg)  Height: 5' (1.524 m)    General: Well developed, well nourished, in no acute distress  Skin : Warm and dry.  Head: Normocephalic and atraumatic  Eyes: Sclera and conjunctiva clear; pupils round and reactive to light; extraocular movements intact  Ears: External normal; canals clear; tympanic membranes normal  Oropharynx: Pink, supple. No suspicious lesions  Neck: Supple without thyromegaly, adenopathy  Lungs: Respirations unlabored; audible wheezing on initial exam; improvement noted after nebulizer treatment; clear to  auscultation bilaterally without wheeze, rales, rhonchi  CVS exam: normal rate and regular rhythm.   Neurologic: Alert and oriented; speech intact; face symmetrical; moves all extremities well; CNII-XII intact without focal deficit  Assessment:  1. Hypothyroidism, unspecified type   2. Bronchitis, acute, with bronchospasm     Plan:  1. Check TSH today; 2. Duo-neb given in office today with benefit; Depo-medrol IM 80 mg; start oral prednisone tomorrow; Rx for Augmentin 875 mg bid x 10 days; sample of BREO given-stressed to take medication daily;   No follow-ups on file.  Orders Placed This Encounter  Procedures  . TSH    Standing Status:   Future    Number of Occurrences:   1    Standing Expiration Date:   11/06/2018    Requested Prescriptions   Signed Prescriptions Disp Refills  . methylPREDNISolone (MEDROL DOSEPAK) 4 MG TBPK tablet 21 tablet 0    Sig: Taper as directed  . amoxicillin-clavulanate (AUGMENTIN) 875-125 MG tablet 20 tablet 0    Sig: Take 1 tablet by mouth every 12 (twelve) hours for 10 days. Take with meal

## 2017-11-06 NOTE — Addendum Note (Signed)
Addended by: Marcina Millard on: 11/06/2017 04:25 PM   Modules accepted: Orders

## 2017-11-06 NOTE — Addendum Note (Signed)
Addended by: Marcina Millard on: 11/06/2017 04:28 PM   Modules accepted: Orders

## 2017-11-08 ENCOUNTER — Ambulatory Visit: Payer: Medicare Other | Admitting: Registered Nurse

## 2017-11-16 ENCOUNTER — Encounter: Payer: Medicare Other | Attending: Physical Medicine & Rehabilitation | Admitting: Registered Nurse

## 2017-11-16 ENCOUNTER — Encounter: Payer: Self-pay | Admitting: Registered Nurse

## 2017-11-16 VITALS — BP 124/85 | HR 80 | Ht 60.0 in | Wt 165.0 lb

## 2017-11-16 DIAGNOSIS — M48062 Spinal stenosis, lumbar region with neurogenic claudication: Secondary | ICD-10-CM | POA: Diagnosis not present

## 2017-11-16 DIAGNOSIS — M419 Scoliosis, unspecified: Secondary | ICD-10-CM | POA: Diagnosis not present

## 2017-11-16 DIAGNOSIS — M4126 Other idiopathic scoliosis, lumbar region: Secondary | ICD-10-CM | POA: Insufficient documentation

## 2017-11-16 DIAGNOSIS — G894 Chronic pain syndrome: Secondary | ICD-10-CM

## 2017-11-16 DIAGNOSIS — Z5181 Encounter for therapeutic drug level monitoring: Secondary | ICD-10-CM

## 2017-11-16 DIAGNOSIS — M961 Postlaminectomy syndrome, not elsewhere classified: Secondary | ICD-10-CM | POA: Diagnosis not present

## 2017-11-16 DIAGNOSIS — M48061 Spinal stenosis, lumbar region without neurogenic claudication: Secondary | ICD-10-CM | POA: Diagnosis not present

## 2017-11-16 DIAGNOSIS — M24551 Contracture, right hip: Secondary | ICD-10-CM | POA: Diagnosis not present

## 2017-11-16 DIAGNOSIS — M1611 Unilateral primary osteoarthritis, right hip: Secondary | ICD-10-CM | POA: Diagnosis not present

## 2017-11-16 DIAGNOSIS — Z79899 Other long term (current) drug therapy: Secondary | ICD-10-CM

## 2017-11-16 MED ORDER — HYDROCODONE-ACETAMINOPHEN 10-325 MG PO TABS: 1.0000 | ORAL_TABLET | Freq: Three times a day (TID) | ORAL | 0 refills | Status: DC | PRN

## 2017-11-16 NOTE — Progress Notes (Signed)
Subjective:    Patient ID: Hannah Scott, female    DOB: 1950/11/01, 67 y.o.   MRN: [id number]  HPI: [name] is a 67 year old female who returns for follow up appointment for chronic pain and medication refill. She states her pain is located in her lower back. She rates her pain 6. Her current exercise regime is walking.   Hannah Scott reports she had a Asthma Flare last week and followed up with her PCP.   Hannah Scott Morphine Equivalent is 33.33 MME.   Spoke with Hannah Scott regarding the [employer] Dr. Letta Pate spoke to her about last month, she would like to know the cost. Will have the [facility] check her formulary, she verbalizes understanding.   Pain Inventory Average Pain 7 Pain Right Now 6 My pain is constant, burning, tingling and aching  In the last 24 hours, has pain interfered with the following? General activity 10 Relation with others 10 Enjoyment of life 10 What TIME of day is your pain at its worst? all Sleep (in general) Poor  Pain is worse with: walking and standing Pain improves with: rest and medication Relief from Meds: 6  Mobility use a cane use a walker ability to climb steps?  yes do you drive?  yes  Function disabled: date disabled .  Neuro/Psych weakness numbness tingling trouble walking  Prior Studies Any changes since last visit?  no  Physicians involved in your care Any changes since last visit?  no   Family History  Problem Relation Age of Onset  . Heart failure Mother   . Pneumonia Mother   . Hypertension Mother   . Heart disease Mother   . Stroke Maternal Grandfather   . Hypertension Maternal Grandfather   . Heart attack Father   . Heart disease Father   . Asthma Paternal Uncle        PAT UNCLES  . Heart attack Paternal Uncle    Social History   Socioeconomic History  . Marital status: Married    Spouse name: Not on file  . Number of children: Not on file  . Years of education: Not on file  . Highest education level: Not  on file  Occupational History  . Not on file  Social Needs  . Financial resource strain: Not on file  . Food insecurity:    Worry: Not on file    Inability: Not on file  . Transportation needs:    Medical: Not on file    Non-medical: Not on file  Tobacco Use  . Smoking status: Never Smoker  . Smokeless tobacco: Never Used  Substance and Sexual Activity  . Alcohol use: No    Alcohol/week: 0.0 oz  . Drug use: No  . Sexual activity: Never    Birth control/protection: Surgical  Lifestyle  . Physical activity:    Days per week: Not on file    Minutes per session: Not on file  . Stress: Not on file  Relationships  . Social connections:    Talks on phone: Not on file    Gets together: Not on file    Attends religious service: Not on file    Active member of club or organization: Not on file    Attends meetings of clubs or organizations: Not on file    Relationship status: Not on file  Other Topics Concern  . Not on file  Social History Narrative  . Not on file   Past Surgical  History:  Procedure Laterality Date  . ANKLE SURGERY Left    ligament  . APPENDECTOMY  1987   AT [facility]  . BACK SURGERY     Fusion  . BREAST LUMPECTOMY  2003   radiation on right  . BREAST LUMPECTOMY WITH RADIOACTIVE SEED LOCALIZATION Left 02/12/2016   Procedure: LEFT BREAST LUMPECTOMY WITH RADIOACTIVE SEED LOCALIZATION;  Surgeon: Excell Seltzer, MD;  Location: [facility];  Service: General;  Laterality: Left;  . CARDIOVASCULAR STRESS TEST  09/07/05   Nuclear, was negative  . CATARACT EXTRACTION Bilateral 01,03  . OOPHORECTOMY     LSO -RSO  . PELVIC LAPAROSCOPY  1989   RSO, LYSIS OF ADHESIONS  . TOTAL ABDOMINAL HYSTERECTOMY  1987   LSO, APPENDECTOMY  . TOTAL HIP ARTHROPLASTY Right 10/28/2013   dr Lorin Mercy  . TOTAL HIP ARTHROPLASTY Right 10/28/2013   Procedure: TOTAL HIP ARTHROPLASTY ANTERIOR APPROACH;  Surgeon: Marybelle Killings, MD;  Location: [facility];  Service: Orthopedics;  Laterality:  Right;  Right Total Hip Arthroplasty, Direct Anterior Approach   Past Medical History:  Diagnosis Date  . Anemia    hx  . Arthritis   . Asthma    PFTs, February, 2011, moderate obstructive disease with response to bronchodilators, normal lung volumes, moderate reduction in diffusing capacity  . Atrial septal aneurysm    Echo, 2008-not noted on 13 echo  . CAD (coronary artery disease)    90% distal LAD in the past  /   nuclear, 2008, no ischemia, ejection fraction 70%  . Cancer ([place]) 2002   DUCTAL CIS--S/P LUMPECTOMY, RADIATION AND 6 WEEKS OF TAMOXIFEN  . D-dimer, elevated    January, 2014  . Depression   . Ejection fraction    EF 60%, echo, October, 2008  . Elevated CPK    January, 2014  . Endometriosis 1989   RIGHT TUBE  . Endometriosis 1987   LEFT TUBE/OVARY W FOCAL IN-SITU ENDOMETRIAL ADENOCARCINOMA  . GERD (gastroesophageal reflux disease)    occ  . H/O hiatal hernia    ?  [name] Hyperlipidemia   . Hypertension   . Hypothyroidism    Patient has had in the past that she does not need treatment  . Kyphoscoliosis   . Obstructive airway disease ([place])   . Pinched nerve    lower back  . Shortness of breath   . UTI (lower urinary tract infection)    BP 124/85   Pulse 80   Ht 5' (1.524 m)   Wt 165 lb (74.8 kg)   SpO2 92%   BMI 32.22 kg/m   Opioid Risk Score:   Fall Risk Score:  `1  Depression screen PHQ 2/9  Depression screen [facility] 2/9 06/02/2017 05/03/2017 01/27/2017 01/10/2017 01/02/2017 09/08/2016 01/06/2016  Decreased Interest 1 1 1  0 0 0 0  Down, Depressed, Hopeless 1 1 1 2 1 1 1   PHQ - 2 Score 2 2 2 2 1 1 1   Altered sleeping - - - 1 - - -  Tired, decreased energy - - - 1 - - -  Change in appetite - - - 0 - - -  Feeling bad or failure about yourself  - - - 1 - - -  Trouble concentrating - - - 0 - - -  Moving slowly or fidgety/restless - - - 0 - - -  Suicidal thoughts - - - 0 - - -  PHQ-9 Score - - - 5 - - -  Difficult doing work/chores - - -  Not difficult at all - -  -  Some recent data might be hidden     Review of Systems  Constitutional: Negative.   HENT: Negative.   Eyes: Negative.   Respiratory: Positive for shortness of breath and wheezing.   Cardiovascular: Negative.   Gastrointestinal: Negative.   Endocrine: Negative.   Genitourinary: Negative.   Musculoskeletal: Positive for arthralgias, back pain, gait problem and myalgias.  Skin: Negative.   Allergic/Immunologic: Negative.   Neurological: Positive for weakness and numbness.  Hematological: Negative.   Psychiatric/Behavioral: Negative.   All other systems reviewed and are negative.      Objective:   Physical Exam  Constitutional: She is oriented to person, place, and time. She appears well-developed and well-nourished.  HENT:  Head: Normocephalic and atraumatic.  Neck: Normal range of motion. Neck supple.  Cardiovascular: Normal rate and regular rhythm.  Pulmonary/Chest: Effort normal and breath sounds normal.  Musculoskeletal:  Normal Muscle Bulk and Muscle Testing Reveals: Upper Extremities: Full ROM and Muscle Strength 5/5 Lumbar Paraspinal Tenderness: L-3-L-5 Lower Extremities: Full ROM and Muscle Strength 5/5 Left Lower Extremity Flexion Produces Pain into Left Hip and Left Lower extremity Arises from Table Slowly Antalgic Gait   Neurological: She is alert and oriented to person, place, and time.  Skin: Skin is warm and dry.  Psychiatric: She has a normal mood and affect. Her behavior is normal.  Nursing note and vitals reviewed.         Assessment & Plan:  1 Lumbar post laminectomy syndrome, s/p lumbar fusion: 11/16/2017 Continue current medication regimen.Refilled: Hydrocodone 10/325mg  one tablet every 6 hours #100. We will continue the opioid monitoring program, this consists of regular clinic visits, examinations, urine drug screen, pill counts as well as use of [place] Controlled Substance Reporting system. 2. Right Hip OA: S/P Right Hip  Replacement 10/28/2013. 11/16/2017 3.Depression: Continue Cymbalta. Has Family Support and Friends. Counseling with Doristine Bosworth. 11/16/2017 4. Bilateral Greater Trochanteric Tenderness: No complaints Today. Continue to Monitor. Continue with Ice and Heat Therapy.11/16/2017  15 minutes of face to face patient care time was spent during this visit. All questions were encouraged and answered.  F/U in 1 month

## 2017-12-12 NOTE — Progress Notes (Signed)
Subjective:    Patient ID: Hannah Scott, female    DOB: 10/04/1950, 67 y.o.   MRN: [id number]  HPI The patient is here for an acute visit.  Asthma flare:  Her asthma flared up and she was here 7/15.  She was diagnosed with acute bronchitis with bronchospasm and given an injection, but no Medrol Dosepak and started on amoxicillin-clavulanate.  When she got down to 1-2 pills on the medrol dose pack her wheezing came back - she still had 5 days of antibiotic left and it was not kicking it out like the prednisone does.  She wanted to complete the antibiotic and states she should come in sooner, but his symptoms have been persistent since then.  She has a cough that is productive at times of green sputum.  Her chest feels tight and full.  She is wheezing, some mild shortness of breath.  She has not noticed any fevers or chills.  She has some hoarseness, sore throat and postnasal drip.  She denies other concerning symptoms.  She is doing her inhalers and breathing treatments and they do help.    Medications and allergies reviewed with patient and updated if appropriate.  Patient Active Problem List   Diagnosis Date Noted  . Osteoporosis 01/07/2017  . Prediabetes 07/07/2016  . History of breast cancer 01/06/2016  . Intraductal papilloma of left breast 01/06/2016  . Essential hypertension 06/08/2015  . DOE (dyspnea on exertion) 06/08/2015  . Fatigue 05/20/2015  . Back pain 04/22/2015  . Lumbar scoliosis 01/10/2014  . Anemia due to blood loss 11/27/2013  . Elevated CPK   . Multinodular goiter 12/01/2011  . Asthma   . Hypothyroidism   . CAD (coronary artery disease)   . Atrial septal aneurysm   . Hyperlipidemia   . Scoliosis   . Spinal stenosis, lumbar region, with neurogenic claudication 07/01/2011  . Lumbar postlaminectomy syndrome 07/01/2011  . Osteoarthritis of right hip 07/01/2011  . Contracture of right hip 07/01/2011  . Endometriosis   . Depression 12/24/2009  . Migraine  headache 05/19/2006    Current Outpatient Medications on File Prior to Visit  Medication Sig Dispense Refill  . albuterol (PROVENTIL HFA;VENTOLIN HFA) 108 (90 Base) MCG/ACT inhaler Inhale 2 puffs into the lungs every 4 (four) hours as needed for wheezing or shortness of breath. 1 Inhaler 0  . atorvastatin (LIPITOR) 20 MG tablet Take 1 tablet (20 mg total) by mouth daily. 90 tablet 3  . benazepril (LOTENSIN) 40 MG tablet Take 1 tablet (40 mg total) by mouth daily. 90 tablet 3  . DULoxetine (CYMBALTA) 60 MG capsule Take 1 capsule (60 mg total) by mouth daily. 90 capsule 3  . fluticasone furoate-vilanterol (BREO ELLIPTA) 100-25 MCG/INH AEPB Inhale 1 puff into the lungs daily.    [name] gabapentin (NEURONTIN) 600 MG tablet Take 1 tablet (600 mg total) by mouth at bedtime. 30 tablet 3  . HYDROcodone-acetaminophen (NORCO) 10-325 MG tablet Take 1 tablet by mouth every 8 (eight) hours as needed. 100 tablet 0  . ipratropium-albuterol (DUONEB) 0.5-2.5 (3) MG/3ML SOLN Take 3 mLs by nebulization every 4 (four) hours as needed (wheezing or SOB). During exacerbation 360 mL 0  . levothyroxine (SYNTHROID, LEVOTHROID) 75 MCG tablet Take 1 tablet (75 mcg total) by mouth daily. 90 tablet 1  . metoprolol tartrate (LOPRESSOR) 50 MG tablet TAKE ONE TABLET BY MOUTH TWICE DAILY 180 tablet 3   No current facility-administered medications on file prior to visit.  Past Medical History:  Diagnosis Date  . Anemia    hx  . Arthritis   . Asthma    PFTs, February, 2011, moderate obstructive disease with response to bronchodilators, normal lung volumes, moderate reduction in diffusing capacity  . Atrial septal aneurysm    Echo, 2008-not noted on 13 echo  . CAD (coronary artery disease)    90% distal LAD in the past  /   nuclear, 2008, no ischemia, ejection fraction 70%  . Cancer ([place]) 2002   DUCTAL CIS--S/P LUMPECTOMY, RADIATION AND 6 WEEKS OF TAMOXIFEN  . D-dimer, elevated    January, 2014  . Depression   .  Ejection fraction    EF 60%, echo, October, 2008  . Elevated CPK    January, 2014  . Endometriosis 1989   RIGHT TUBE  . Endometriosis 1987   LEFT TUBE/OVARY W FOCAL IN-SITU ENDOMETRIAL ADENOCARCINOMA  . GERD (gastroesophageal reflux disease)    occ  . H/O hiatal hernia    ?  [name] Hyperlipidemia   . Hypertension   . Hypothyroidism    Patient has had in the past that she does not need treatment  . Kyphoscoliosis   . Obstructive airway disease ([place])   . Pinched nerve    lower back  . Shortness of breath   . UTI (lower urinary tract infection)     Past Surgical History:  Procedure Laterality Date  . ANKLE SURGERY Left    ligament  . APPENDECTOMY  1987   AT [facility]  . BACK SURGERY     Fusion  . BREAST LUMPECTOMY  2003   radiation on right  . BREAST LUMPECTOMY WITH RADIOACTIVE SEED LOCALIZATION Left 02/12/2016   Procedure: LEFT BREAST LUMPECTOMY WITH RADIOACTIVE SEED LOCALIZATION;  Surgeon: Excell Seltzer, MD;  Location: [facility];  Service: General;  Laterality: Left;  . CARDIOVASCULAR STRESS TEST  09/07/05   Nuclear, was negative  . CATARACT EXTRACTION Bilateral 01,03  . OOPHORECTOMY     [facility] -RSO  . PELVIC LAPAROSCOPY  1989   RSO, LYSIS OF ADHESIONS  . TOTAL ABDOMINAL HYSTERECTOMY  1987   LSO, APPENDECTOMY  . TOTAL HIP ARTHROPLASTY Right 10/28/2013   dr Lorin Mercy  . TOTAL HIP ARTHROPLASTY Right 10/28/2013   Procedure: TOTAL HIP ARTHROPLASTY ANTERIOR APPROACH;  Surgeon: Marybelle Killings, MD;  Location: [facility];  Service: Orthopedics;  Laterality: Right;  Right Total Hip Arthroplasty, Direct Anterior Approach    Social History   Socioeconomic History  . Marital status: Married    Spouse name: Not on file  . Number of children: Not on file  . Years of education: Not on file  . Highest education level: Not on file  Occupational History  . Not on file  Social Needs  . Financial resource strain: Not on file  . Food insecurity:    Worry: Not on file    Inability:  Not on file  . Transportation needs:    Medical: Not on file    Non-medical: Not on file  Tobacco Use  . Smoking status: Never Smoker  . Smokeless tobacco: Never Used  Substance and Sexual Activity  . Alcohol use: No    Alcohol/week: 0.0 standard drinks  . Drug use: No  . Sexual activity: Never    Birth control/protection: Surgical  Lifestyle  . Physical activity:    Days per week: Not on file    Minutes per session: Not on file  . Stress: Not on file  Relationships  .  Social connections:    Talks on phone: Not on file    Gets together: Not on file    Attends religious service: Not on file    Active member of club or organization: Not on file    Attends meetings of clubs or organizations: Not on file    Relationship status: Not on file  Other Topics Concern  . Not on file  Social History Narrative  . Not on file    Family History  Problem Relation Age of Onset  . Heart failure Mother   . Pneumonia Mother   . Hypertension Mother   . Heart disease Mother   . Stroke Maternal Grandfather   . Hypertension Maternal Grandfather   . Heart attack Father   . Heart disease Father   . Asthma Paternal Uncle        PAT UNCLES  . Heart attack Paternal Uncle     Review of Systems  Constitutional: Negative for chills and fever.  HENT: Positive for postnasal drip, sore throat and voice change. Negative for congestion, ear pain, sinus pressure and sinus pain.   Respiratory: Positive for cough (productive at times - green discoloration), chest tightness, shortness of breath and wheezing.   Neurological: Negative for light-headedness and headaches.       Objective:   Vitals:   12/13/17 0914  BP: (!) 164/84  Pulse: 72  Resp: 20  Temp: 98 F (36.7 C)  SpO2: 92%   BP Readings from Last 3 Encounters:  12/13/17 (!) 164/84  11/16/17 124/85  11/06/17 (!) 142/84   Wt Readings from Last 3 Encounters:  12/13/17 166 lb (75.3 kg)  11/16/17 165 lb (74.8 kg)  11/06/17 167 lb  1.3 oz (75.8 kg)   Body mass index is 32.42 kg/m.   Physical Exam    GENERAL APPEARANCE: Appears stated age, mildly ill appearing, NAD EYES: conjunctiva clear, no icterus HEENT: bilateral tympanic membranes and ear canals normal, oropharynx with mild erythema, no thyromegaly, trachea midline, no cervical or supraclavicular lymphadenopathy LUNGS: unlabored breathing, good air entry bilaterally.  Diffuse expiratory wheeze at the end of expiration.  No crackles CARDIOVASCULAR: Normal S1,S2 without murmurs, no edema SKIN: Warm, dry      Assessment & Plan:    See Problem List for Assessment and Plan of chronic medical problems.

## 2017-12-13 ENCOUNTER — Encounter: Payer: Self-pay | Admitting: Internal Medicine

## 2017-12-13 ENCOUNTER — Ambulatory Visit (INDEPENDENT_AMBULATORY_CARE_PROVIDER_SITE_OTHER)
Admission: RE | Admit: 2017-12-13 | Discharge: 2017-12-13 | Disposition: A | Discharge status: Home / Self Care | Payer: Medicare Other | Source: Ambulatory Visit | Attending: Internal Medicine | Admitting: Internal Medicine

## 2017-12-13 ENCOUNTER — Ambulatory Visit: Payer: Medicare Other | Admitting: Internal Medicine

## 2017-12-13 VITALS — BP 164/84 | HR 72 | Temp 98.0°F | Resp 20 | Ht 60.0 in | Wt 166.0 lb

## 2017-12-13 DIAGNOSIS — J4541 Moderate persistent asthma with (acute) exacerbation: Secondary | ICD-10-CM

## 2017-12-13 DIAGNOSIS — J209 Acute bronchitis, unspecified: Secondary | ICD-10-CM

## 2017-12-13 DIAGNOSIS — R0602 Shortness of breath: Secondary | ICD-10-CM | POA: Diagnosis not present

## 2017-12-13 DIAGNOSIS — J4540 Moderate persistent asthma, uncomplicated: Secondary | ICD-10-CM | POA: Insufficient documentation

## 2017-12-13 DIAGNOSIS — R05 Cough: Secondary | ICD-10-CM | POA: Diagnosis not present

## 2017-12-13 MED ORDER — DOXYCYCLINE HYCLATE 100 MG PO TABS: 100.0000 mg | ORAL_TABLET | Freq: Two times a day (BID) | ORAL | 0 refills | Status: DC

## 2017-12-13 MED ORDER — METHYLPREDNISOLONE ACETATE 80 MG/ML IJ SUSP
80.0000 mg | Freq: Once | INTRAMUSCULAR | Status: AC
  Administered 2017-12-13: 80 mg via INTRAMUSCULAR

## 2017-12-13 MED ORDER — PREDNISONE 10 MG PO TABS: ORAL_TABLET | ORAL | 0 refills | Status: DC

## 2017-12-13 NOTE — Assessment & Plan Note (Signed)
asthma exacerbation secondary to bacterial bronchitis Treated last month, but symptoms have been persistent Chest x-ray to rule out pneumonia Doxycycline twice daily x10 days Depo-Medrol 80 mg IM x1 Prednisone taper starting tomorrow Continue inhalers and nebulizer treatments Call if no improvement

## 2017-12-13 NOTE — Patient Instructions (Addendum)
Have a chest x-ray today.  We will call you with the results.  You received a steroid injection today.     Take the prednisone taper starting tomorrow.  Start the doxycyline today.   Continue your inhalers/neb treatments.    Call if no improvement

## 2017-12-13 NOTE — Assessment & Plan Note (Signed)
Likely bacterial in nature and causing an asthma exacerbation Treated last month, but symptoms have been persistent Chest x-ray to rule out pneumonia Doxycycline twice daily x10 days Depo-Medrol 80 mg IM x1 Prednisone taper starting tomorrow Continue inhalers and nebulizer treatments Call if no improvement

## 2017-12-14 ENCOUNTER — Encounter: Payer: Self-pay | Admitting: Registered Nurse

## 2017-12-14 ENCOUNTER — Encounter: Payer: Medicare Other | Attending: Physical Medicine & Rehabilitation | Admitting: Registered Nurse

## 2017-12-14 ENCOUNTER — Encounter: Payer: Self-pay | Admitting: Internal Medicine

## 2017-12-14 VITALS — BP 135/84 | HR 84 | Ht 60.0 in | Wt 167.0 lb

## 2017-12-14 DIAGNOSIS — M4126 Other idiopathic scoliosis, lumbar region: Secondary | ICD-10-CM | POA: Diagnosis not present

## 2017-12-14 DIAGNOSIS — M961 Postlaminectomy syndrome, not elsewhere classified: Secondary | ICD-10-CM

## 2017-12-14 DIAGNOSIS — Z79899 Other long term (current) drug therapy: Secondary | ICD-10-CM

## 2017-12-14 DIAGNOSIS — M1611 Unilateral primary osteoarthritis, right hip: Secondary | ICD-10-CM | POA: Diagnosis present

## 2017-12-14 DIAGNOSIS — M24551 Contracture, right hip: Secondary | ICD-10-CM | POA: Diagnosis not present

## 2017-12-14 DIAGNOSIS — G894 Chronic pain syndrome: Secondary | ICD-10-CM

## 2017-12-14 DIAGNOSIS — M419 Scoliosis, unspecified: Secondary | ICD-10-CM

## 2017-12-14 DIAGNOSIS — Z5181 Encounter for therapeutic drug level monitoring: Secondary | ICD-10-CM

## 2017-12-14 DIAGNOSIS — M48062 Spinal stenosis, lumbar region with neurogenic claudication: Secondary | ICD-10-CM

## 2017-12-14 DIAGNOSIS — M48061 Spinal stenosis, lumbar region without neurogenic claudication: Secondary | ICD-10-CM | POA: Insufficient documentation

## 2017-12-14 MED ORDER — HYDROCODONE-ACETAMINOPHEN 10-325 MG PO TABS: 1.0000 | ORAL_TABLET | Freq: Three times a day (TID) | ORAL | 0 refills | Status: DC | PRN

## 2017-12-14 NOTE — Progress Notes (Signed)
Subjective:    Patient ID: Hannah Scott, female    DOB: 07-03-1950, 67 y.o.   MRN: [id number]  HPI: [name] is a 67 year old female who returns for follow up appointment for chronic pain and medication refill. She states her pain is located in her lower back. She rates her pain 6. Her current exercise regime is walking.   Hannah Scott Morphine Equivalent is 33.33 MME. Last UDS was Performed on 10/12/2017, it was consistent.   Discuss with Hannah Scott Butrans not covered under her drug formulary, Georganna Skeans is. We discuss treatment modality and cost. Hannah Scott states sue to her Financial hardship she will not be able to afford co-pay, she will remain on hydrocodone. She verbalizes understanding.   Pain Inventory Average Pain 7 Pain Right Now 6 My pain is constant, burning, tingling and aching  In the last 24 hours, has pain interfered with the following? General activity 10 Relation with others 10 Enjoyment of life 10 What TIME of day is your pain at its worst? all Sleep (in general) Poor  Pain is worse with: walking and standing Pain improves with: rest and medication Relief from Meds: 5  Mobility use a cane use a walker ability to climb steps?  yes do you drive?  yes  Function disabled: date disabled .  Neuro/Psych weakness numbness tingling trouble walking depression  Prior Studies Any changes since last visit?  no  Physicians involved in your care Any changes since last visit?  no   Family History  Problem Relation Age of Onset  . Heart failure Mother   . Pneumonia Mother   . Hypertension Mother   . Heart disease Mother   . Stroke Maternal Grandfather   . Hypertension Maternal Grandfather   . Heart attack Father   . Heart disease Father   . Asthma Paternal Uncle        PAT UNCLES  . Heart attack Paternal Uncle    Social History   Socioeconomic History  . Marital status: Married    Spouse name: Not on file  . Number of children: Not on file  . Years  of education: Not on file  . Highest education level: Not on file  Occupational History  . Not on file  Social Needs  . Financial resource strain: Not on file  . Food insecurity:    Worry: Not on file    Inability: Not on file  . Transportation needs:    Medical: Not on file    Non-medical: Not on file  Tobacco Use  . Smoking status: Never Smoker  . Smokeless tobacco: Never Used  Substance and Sexual Activity  . Alcohol use: No    Alcohol/week: 0.0 standard drinks  . Drug use: No  . Sexual activity: Never    Birth control/protection: Surgical  Lifestyle  . Physical activity:    Days per week: Not on file    Minutes per session: Not on file  . Stress: Not on file  Relationships  . Social connections:    Talks on phone: Not on file    Gets together: Not on file    Attends religious service: Not on file    Active member of club or organization: Not on file    Attends meetings of clubs or organizations: Not on file    Relationship status: Not on file  Other Topics Concern  . Not on file  Social History Narrative  . Not on file  Past Surgical History:  Procedure Laterality Date  . ANKLE SURGERY Left    ligament  . APPENDECTOMY  1987   AT [facility]  . BACK SURGERY     Fusion  . BREAST LUMPECTOMY  2003   radiation on right  . BREAST LUMPECTOMY WITH RADIOACTIVE SEED LOCALIZATION Left 02/12/2016   Procedure: LEFT BREAST LUMPECTOMY WITH RADIOACTIVE SEED LOCALIZATION;  Surgeon: Excell Seltzer, MD;  Location: [facility];  Service: General;  Laterality: Left;  . CARDIOVASCULAR STRESS TEST  09/07/05   Nuclear, was negative  . CATARACT EXTRACTION Bilateral 01,03  . OOPHORECTOMY     LSO -RSO  . PELVIC LAPAROSCOPY  1989   RSO, LYSIS OF ADHESIONS  . TOTAL ABDOMINAL HYSTERECTOMY  1987   LSO, APPENDECTOMY  . TOTAL HIP ARTHROPLASTY Right 10/28/2013   dr Lorin Mercy  . TOTAL HIP ARTHROPLASTY Right 10/28/2013   Procedure: TOTAL HIP ARTHROPLASTY ANTERIOR APPROACH;  Surgeon:  Marybelle Killings, MD;  Location: [facility];  Service: Orthopedics;  Laterality: Right;  Right Total Hip Arthroplasty, Direct Anterior Approach   Past Medical History:  Diagnosis Date  . Anemia    hx  . Arthritis   . Asthma    PFTs, February, 2011, moderate obstructive disease with response to bronchodilators, normal lung volumes, moderate reduction in diffusing capacity  . Atrial septal aneurysm    Echo, 2008-not noted on 13 echo  . CAD (coronary artery disease)    90% distal LAD in the past  /   nuclear, 2008, no ischemia, ejection fraction 70%  . Cancer ([place]) 2002   DUCTAL CIS--S/P LUMPECTOMY, RADIATION AND 6 WEEKS OF TAMOXIFEN  . D-dimer, elevated    January, 2014  . Depression   . Ejection fraction    EF 60%, echo, October, 2008  . Elevated CPK    January, 2014  . Endometriosis 1989   RIGHT TUBE  . Endometriosis 1987   LEFT TUBE/OVARY W FOCAL IN-SITU ENDOMETRIAL ADENOCARCINOMA  . GERD (gastroesophageal reflux disease)    occ  . H/O hiatal hernia    ?  [name] Hyperlipidemia   . Hypertension   . Hypothyroidism    Patient has had in the past that she does not need treatment  . Kyphoscoliosis   . Obstructive airway disease ([place])   . Pinched nerve    lower back  . Shortness of breath   . UTI (lower urinary tract infection)    BP 135/84   Pulse 84   Ht 5' (1.524 m)   Wt 167 lb (75.8 kg)   SpO2 91%   BMI 32.61 kg/m   Opioid Risk Score:   Fall Risk Score:  `1  Depression screen PHQ 2/9  Depression screen [facility] 2/9 06/02/2017 05/03/2017 01/27/2017 01/10/2017 01/02/2017 09/08/2016 01/06/2016  Decreased Interest 1 1 1  0 0 0 0  Down, Depressed, Hopeless 1 1 1 2 1 1 1   PHQ - 2 Score 2 2 2 2 1 1 1   Altered sleeping - - - 1 - - -  Tired, decreased energy - - - 1 - - -  Change in appetite - - - 0 - - -  Feeling bad or failure about yourself  - - - 1 - - -  Trouble concentrating - - - 0 - - -  Moving slowly or fidgety/restless - - - 0 - - -  Suicidal thoughts - - - 0 - - -  PHQ-9 Score -  - - 5 - - -  Difficult  doing work/chores - - - Not difficult at all - - -  Some recent data might be hidden     Review of Systems  Constitutional: Negative.   HENT: Negative.   Eyes: Negative.   Respiratory: Negative.   Cardiovascular: Negative.   Gastrointestinal: Negative.   Endocrine: Negative.   Genitourinary: Negative.   Musculoskeletal: Positive for arthralgias, back pain, gait problem and myalgias.  Skin: Negative.   Allergic/Immunologic: Negative.   Neurological: Positive for weakness and numbness.  Hematological: Negative.   Psychiatric/Behavioral: Positive for dysphoric mood.  All other systems reviewed and are negative.      Objective:   Physical Exam  Constitutional: She is oriented to person, place, and time. She appears well-developed and well-nourished.  HENT:  Head: Normocephalic and atraumatic.  Neck: Normal range of motion. Neck supple.  Cardiovascular: Normal rate and regular rhythm.  Pulmonary/Chest: Effort normal and breath sounds normal.  Musculoskeletal:  Normal Muscle Bulk and Muscle Testing Reveals: Upper Extremities: Full ROM and Muscle Strength 5/5 Lumbar Paraspinal Tenderness: L-4-L-5 Lower Extremities: Full ROM and Muscle Strength 5/5 Arises from Table slowly using walker for support Antalgic gait  Neurological: She is alert and oriented to person, place, and time.  Skin: Skin is warm and dry.  Psychiatric: She has a normal mood and affect. Her behavior is normal.  Nursing note and vitals reviewed.         Assessment & Plan:  1 Lumbar post laminectomy syndrome, s/p lumbar fusion: 12/14/2017 Continue current medication regimen.Refilled: Hydrocodone 10/325mg  one tablet every 6 hours #100. We will continue the opioid monitoring program, this consists of regular clinic visits, examinations, urine drug screen, pill counts as well as use of [place] Controlled Substance Reporting system. 2. Right Hip OA: S/P Right Hip Replacement  10/28/2013. 12/14/2017 3.Depression: Continue Cymbalta. Has Family Support and Friends. Counseling with Doristine Bosworth. 12/14/2017 4.BilateralGreater Trochanteric Tenderness: No complaints Today.Continue to Monitor. Continue with Ice and Heat Therapy.12/14/2017  15 minutes of face to face patient care time was spent during this visit. All questions were encouraged and answered.  F/U in 1 month

## 2017-12-27 ENCOUNTER — Other Ambulatory Visit: Payer: Self-pay | Admitting: Registered Nurse

## 2018-01-03 DIAGNOSIS — Z1231 Encounter for screening mammogram for malignant neoplasm of breast: Secondary | ICD-10-CM | POA: Diagnosis not present

## 2018-01-03 LAB — HM MAMMOGRAPHY

## 2018-01-09 ENCOUNTER — Encounter: Payer: Self-pay | Admitting: Internal Medicine

## 2018-01-15 NOTE — Progress Notes (Signed)
Subjective:    Patient ID: Hannah Scott, female    DOB: 1951-01-25, 67 y.o.   MRN: [id number]  HPI The patient is here for an acute visit.   Fatigue, ? Thyroid level off:  She has had no energy for a couple of weeks - it has been worse the past few days.  She has no energy.  She does not sleep well but that is not new.  She has interrupted sleep.  She gets maybe 4 hours of sleep at night and it has been like that for a long time.  She can barely keep her eyes open sometimes.  She has felt this way in the past when her thyroid was off and she wonders if it is off now.  She does have some depression.  She is taking her Cymbalta, but it does not seem to be working as good as it was previously.      Medications and allergies reviewed with patient and updated if appropriate.  Patient Active Problem List   Diagnosis Date Noted  . Moderate persistent asthma with exacerbation 12/13/2017  . Osteoporosis 01/07/2017  . Acute bronchitis 10/17/2016  . Prediabetes 07/07/2016  . History of breast cancer 01/06/2016  . Intraductal papilloma of left breast 01/06/2016  . Essential hypertension 06/08/2015  . DOE (dyspnea on exertion) 06/08/2015  . Fatigue 05/20/2015  . Back pain 04/22/2015  . Lumbar scoliosis 01/10/2014  . Anemia due to blood loss 11/27/2013  . Elevated CPK   . Multinodular goiter 12/01/2011  . Asthma   . Hypothyroidism   . CAD (coronary artery disease)   . Atrial septal aneurysm   . Hyperlipidemia   . Scoliosis   . Spinal stenosis, lumbar region, with neurogenic claudication 07/01/2011  . Lumbar postlaminectomy syndrome 07/01/2011  . Osteoarthritis of right hip 07/01/2011  . Contracture of right hip 07/01/2011  . Endometriosis   . Depression 12/24/2009  . Migraine headache 05/19/2006    Current Outpatient Medications on File Prior to Visit  Medication Sig Dispense Refill  . albuterol (PROVENTIL HFA;VENTOLIN HFA) 108 (90 Base) MCG/ACT inhaler Inhale 2 puffs into the  lungs every 4 (four) hours as needed for wheezing or shortness of breath. 1 Inhaler 0  . atorvastatin (LIPITOR) 20 MG tablet Take 1 tablet (20 mg total) by mouth daily. 90 tablet 3  . benazepril (LOTENSIN) 40 MG tablet Take 1 tablet (40 mg total) by mouth daily. 90 tablet 3  . DULoxetine (CYMBALTA) 60 MG capsule Take 60 mg by mouth daily.    . fluticasone furoate-vilanterol (BREO ELLIPTA) 100-25 MCG/INH AEPB Inhale 1 puff into the lungs daily.    [name] gabapentin (NEURONTIN) 600 MG tablet TAKE ONE TABLET BY MOUTH AT BEDTIME 30 tablet 3  . HYDROcodone-acetaminophen (NORCO) 10-325 MG tablet Take 1 tablet by mouth every 8 (eight) hours as needed. 100 tablet 0  . ipratropium-albuterol (DUONEB) 0.5-2.5 (3) MG/3ML SOLN Take 3 mLs by nebulization every 4 (four) hours as needed (wheezing or SOB). During exacerbation 360 mL 0  . levothyroxine (SYNTHROID, LEVOTHROID) 75 MCG tablet Take 1 tablet (75 mcg total) by mouth daily. 90 tablet 1  . metoprolol tartrate (LOPRESSOR) 50 MG tablet TAKE ONE TABLET BY MOUTH TWICE DAILY 180 tablet 3   No current facility-administered medications on file prior to visit.     Past Medical History:  Diagnosis Date  . Anemia    hx  . Arthritis   . Asthma    PFTs, February, 2011, moderate  obstructive disease with response to bronchodilators, normal lung volumes, moderate reduction in diffusing capacity  . Atrial septal aneurysm    Echo, 2008-not noted on 13 echo  . CAD (coronary artery disease)    90% distal LAD in the past  /   nuclear, 2008, no ischemia, ejection fraction 70%  . Cancer ([place]) 2002   DUCTAL CIS--S/P LUMPECTOMY, RADIATION AND 6 WEEKS OF TAMOXIFEN  . D-dimer, elevated    January, 2014  . Depression   . Ejection fraction    EF 60%, echo, October, 2008  . Elevated CPK    January, 2014  . Endometriosis 1989   RIGHT TUBE  . Endometriosis 1987   LEFT TUBE/OVARY W FOCAL IN-SITU ENDOMETRIAL ADENOCARCINOMA  . GERD (gastroesophageal reflux disease)    occ    . H/O hiatal hernia    ?  [name] Hyperlipidemia   . Hypertension   . Hypothyroidism    Patient has had in the past that she does not need treatment  . Kyphoscoliosis   . Obstructive airway disease ([place])   . Pinched nerve    lower back  . Shortness of breath   . UTI (lower urinary tract infection)     Past Surgical History:  Procedure Laterality Date  . ANKLE SURGERY Left    ligament  . APPENDECTOMY  1987   AT [facility]  . BACK SURGERY     Fusion  . BREAST LUMPECTOMY  2003   radiation on right  . BREAST LUMPECTOMY WITH RADIOACTIVE SEED LOCALIZATION Left 02/12/2016   Procedure: LEFT BREAST LUMPECTOMY WITH RADIOACTIVE SEED LOCALIZATION;  Surgeon: Excell Seltzer, MD;  Location: [place] [facility];  Service: General;  Laterality: Left;  . CARDIOVASCULAR STRESS TEST  09/07/05   Nuclear, was negative  . CATARACT EXTRACTION Bilateral 01,03  . OOPHORECTOMY     LSO -RSO  . PELVIC LAPAROSCOPY  1989   RSO, LYSIS OF ADHESIONS  . TOTAL ABDOMINAL HYSTERECTOMY  1987   LS[facility], APPENDECTOMY  . TOTAL HIP ARTHROPLASTY Right 10/28/2013   dr Lorin Mercy  . TOTAL HIP ARTHROPLASTY Right 10/28/2013   Procedure: TOTAL HIP ARTHROPLASTY ANTERIOR APPROACH;  Surgeon: Marybelle Killings, MD;  Location: [place];  Service: Orthopedics;  Laterality: Right;  Right Total Hip Arthroplasty, Direct Anterior Approach    Social History   Socioeconomic History  . Marital status: Married    Spouse name: Not on file  . Number of children: Not on file  . Years of education: Not on file  . Highest education level: Not on file  Occupational History  . Not on file  Social Needs  . Financial resource strain: Not on file  . Food insecurity:    Worry: Not on file    Inability: Not on file  . Transportation needs:    Medical: Not on file    Non-medical: Not on file  Tobacco Use  . Smoking status: Never Smoker  . Smokeless tobacco: Never Used  Substance and Sexual Activity  . Alcohol use: No    Alcohol/week: 0.0 standard  drinks  . Drug use: No  . Sexual activity: Never    Birth control/protection: Surgical  Lifestyle  . Physical activity:    Days per week: Not on file    Minutes per session: Not on file  . Stress: Not on file  Relationships  . Social connections:    Talks on phone: Not on file    Gets together: Not on file    Attends religious service:  Not on file    Active member of club or organization: Not on file    Attends meetings of clubs or organizations: Not on file    Relationship status: Not on file  Other Topics Concern  . Not on file  Social History Narrative  . Not on file    Family History  Problem Relation Age of Onset  . Heart failure Mother   . Pneumonia Mother   . Hypertension Mother   . Heart disease Mother   . Stroke Maternal Grandfather   . Hypertension Maternal Grandfather   . Heart attack Father   . Heart disease Father   . Asthma Paternal Uncle        PAT UNCLES  . Heart attack Paternal Uncle     Review of Systems  Constitutional: Positive for fatigue. Negative for chills and fever.  HENT: Positive for ear pain (left ear sunday night) and postnasal drip. Negative for congestion and sore throat.   Respiratory: Negative for cough, shortness of breath and wheezing.   Cardiovascular: Negative for chest pain and palpitations.  Gastrointestinal: Negative for abdominal pain and nausea.  Neurological: Negative for light-headedness and headaches.  Psychiatric/Behavioral: Positive for dysphoric mood and sleep disturbance.       Objective:   Vitals:   01/16/18 1320  BP: (!) 172/94  Pulse: 72  Resp: 16  Temp: 99.6 F (37.6 C)  SpO2: 93%   BP Readings from Last 3 Encounters:  01/16/18 (!) 172/94  12/14/17 135/84  12/13/17 (!) 164/84   Wt Readings from Last 3 Encounters:  01/16/18 167 lb (75.8 kg)  12/14/17 167 lb (75.8 kg)  12/13/17 166 lb (75.3 kg)   Body mass index is 32.61 kg/m.   Physical Exam    Constitutional: Appears well-developed and  well-nourished. No distress.  HENT:  Head: Normocephalic and atraumatic.  Neck: Neck supple. No tracheal deviation present. No thyromegaly present.  No cervical lymphadenopathy Cardiovascular: Normal rate, regular rhythm and normal heart sounds.   3/6 systolic  murmur heard. No carotid bruit .  No edema Pulmonary/Chest: Effort normal and breath sounds normal. No respiratory distress. No has no wheezes. No rales.  Skin: Skin is warm and dry. Not diaphoretic.  Psychiatric: depressed mood and affect. Behavior is normal.       Assessment & Plan:    See Problem List for Assessment and Plan of chronic medical problems.

## 2018-01-16 ENCOUNTER — Other Ambulatory Visit: Payer: Self-pay | Admitting: Internal Medicine

## 2018-01-16 ENCOUNTER — Ambulatory Visit: Payer: Medicare Other | Admitting: Internal Medicine

## 2018-01-16 ENCOUNTER — Other Ambulatory Visit (INDEPENDENT_AMBULATORY_CARE_PROVIDER_SITE_OTHER): Payer: Medicare Other

## 2018-01-16 ENCOUNTER — Encounter: Payer: Self-pay | Admitting: Internal Medicine

## 2018-01-16 VITALS — BP 172/94 | HR 72 | Temp 99.6°F | Resp 16 | Ht 60.0 in | Wt 167.0 lb

## 2018-01-16 DIAGNOSIS — R5383 Other fatigue: Secondary | ICD-10-CM

## 2018-01-16 DIAGNOSIS — R011 Cardiac murmur, unspecified: Secondary | ICD-10-CM

## 2018-01-16 DIAGNOSIS — E039 Hypothyroidism, unspecified: Secondary | ICD-10-CM

## 2018-01-16 DIAGNOSIS — I10 Essential (primary) hypertension: Secondary | ICD-10-CM | POA: Diagnosis not present

## 2018-01-16 LAB — CBC WITH DIFFERENTIAL/PLATELET
BASOS ABS: 0 10*3/uL (ref 0.0–0.1)
BASOS PCT: 0.5 % (ref 0.0–3.0)
EOS ABS: 0.4 10*3/uL (ref 0.0–0.7)
Eosinophils Relative: 6 % — ABNORMAL HIGH (ref 0.0–5.0)
HEMATOCRIT: 38.4 % (ref 36.0–46.0)
Hemoglobin: 12.3 g/dL (ref 12.0–15.0)
Lymphocytes Relative: 18.4 % (ref 12.0–46.0)
Lymphs Abs: 1.1 10*3/uL (ref 0.7–4.0)
MCHC: 32 g/dL (ref 30.0–36.0)
MCV: 85.5 fl (ref 78.0–100.0)
Monocytes Absolute: 0.5 10*3/uL (ref 0.1–1.0)
Monocytes Relative: 9 % (ref 3.0–12.0)
Neutro Abs: 4 10*3/uL (ref 1.4–7.7)
Neutrophils Relative %: 66.1 % (ref 43.0–77.0)
Platelets: 271 10*3/uL (ref 150.0–400.0)
RBC: 4.49 Mil/uL (ref 3.87–5.11)
RDW: 17.7 % — AB (ref 11.5–15.5)
WBC: 6 10*3/uL (ref 4.0–10.5)

## 2018-01-16 LAB — COMPREHENSIVE METABOLIC PANEL
ALBUMIN: 4.1 g/dL (ref 3.5–5.2)
ALT: 13 U/L (ref 0–35)
AST: 16 U/L (ref 0–37)
Alkaline Phosphatase: 70 U/L (ref 39–117)
BUN: 12 mg/dL (ref 6–23)
CHLORIDE: 101 meq/L (ref 96–112)
CO2: 34 mEq/L — ABNORMAL HIGH (ref 19–32)
Calcium: 9.4 mg/dL (ref 8.4–10.5)
Creatinine, Ser: 0.56 mg/dL (ref 0.40–1.20)
GFR: 114.27 mL/min (ref >60.00)
Glucose, Bld: 99 mg/dL (ref 70–99)
POTASSIUM: 4.5 meq/L (ref 3.5–5.1)
SODIUM: 140 meq/L (ref 135–145)
Total Bilirubin: 0.6 mg/dL (ref 0.2–1.2)
Total Protein: 6.5 g/dL (ref 6.0–8.3)

## 2018-01-16 LAB — TSH: TSH: 0.58 u[IU]/mL (ref 0.35–4.50)

## 2018-01-16 LAB — T4, FREE: Free T4: 1.2 ng/dL (ref 0.60–1.60)

## 2018-01-16 LAB — T3, FREE: T3, Free: 3.4 pg/mL (ref 2.3–4.2)

## 2018-01-16 NOTE — Assessment & Plan Note (Addendum)
BP at home 109 - 140, but high here She see cardio next month- advised her to monitor her BP and keep a log to bring to cardiology No change in medications today

## 2018-01-16 NOTE — Assessment & Plan Note (Signed)
Having fatigue for a couple of weeks - worse in past few days ? Thyroid - check tfts ? Poor quality sleep and not getting enough - may be contributing Depression not ideally controlled - will check tfts but may need adjustment to Cymbalta ? Cardiac cause contributing to fatigue Will check labs and determine further evaluation after

## 2018-01-16 NOTE — Assessment & Plan Note (Signed)
?   Increased fatigue She feels her thyroid may be off Check tfts Will adjust medication if needed

## 2018-01-16 NOTE — Patient Instructions (Addendum)
  Tests ordered today. Your results will be released to [facility] (or called to you) after review, usually within 72hours after test completion. If any changes need to be made, you will be notified at that same time.   Medications reviewed and updated.  Changes include :   None  An Echo was ordered - someone will call you to schedule this.   Further evaluation may be needed depending on your blood work.

## 2018-01-16 NOTE — Assessment & Plan Note (Signed)
?   More pronounced Will order echo Has cardio f/u next month

## 2018-01-17 ENCOUNTER — Other Ambulatory Visit: Payer: Self-pay

## 2018-01-17 ENCOUNTER — Encounter: Payer: Medicare Other | Attending: Physical Medicine & Rehabilitation | Admitting: Registered Nurse

## 2018-01-17 ENCOUNTER — Encounter: Payer: Self-pay | Admitting: Registered Nurse

## 2018-01-17 VITALS — BP 147/87 | HR 65 | Ht 60.0 in | Wt 166.0 lb

## 2018-01-17 DIAGNOSIS — M961 Postlaminectomy syndrome, not elsewhere classified: Secondary | ICD-10-CM | POA: Diagnosis not present

## 2018-01-17 DIAGNOSIS — M24551 Contracture, right hip: Secondary | ICD-10-CM

## 2018-01-17 DIAGNOSIS — M4126 Other idiopathic scoliosis, lumbar region: Secondary | ICD-10-CM | POA: Diagnosis not present

## 2018-01-17 DIAGNOSIS — M48062 Spinal stenosis, lumbar region with neurogenic claudication: Secondary | ICD-10-CM | POA: Diagnosis not present

## 2018-01-17 DIAGNOSIS — M419 Scoliosis, unspecified: Secondary | ICD-10-CM

## 2018-01-17 DIAGNOSIS — Z79899 Other long term (current) drug therapy: Secondary | ICD-10-CM

## 2018-01-17 DIAGNOSIS — M48061 Spinal stenosis, lumbar region without neurogenic claudication: Secondary | ICD-10-CM | POA: Insufficient documentation

## 2018-01-17 DIAGNOSIS — G894 Chronic pain syndrome: Secondary | ICD-10-CM

## 2018-01-17 DIAGNOSIS — Z5181 Encounter for therapeutic drug level monitoring: Secondary | ICD-10-CM

## 2018-01-17 DIAGNOSIS — M1611 Unilateral primary osteoarthritis, right hip: Secondary | ICD-10-CM | POA: Diagnosis not present

## 2018-01-17 MED ORDER — HYDROCODONE-ACETAMINOPHEN 10-325 MG PO TABS: 1.0000 | ORAL_TABLET | Freq: Three times a day (TID) | ORAL | 0 refills | Status: DC | PRN

## 2018-01-17 NOTE — Progress Notes (Signed)
Subjective:    Patient ID: Hannah Scott, female    DOB: 11/25/1950, 67 y.o.   MRN: [id number]  HPI: Hannah Scott is a 67 year old female who returns for follow up appointment for chronic pain and medication refill. She states her pain is located in her lower back. She rates her pain 6. Her current exercise regime is walking.   Hannah Scott Morphine equivalent is 33.33 MME. Last UDS was Performed on 10/12/2017, it was consistent.   Pain Inventory Average Pain 7 Pain Right Now 6 My pain is constant, burning, tingling and aching  In the last 24 hours, has pain interfered with the following? General activity 10 Relation with others 10 Enjoyment of life 10 What TIME of day is your pain at its worst? all Sleep (in general) Poor  Pain is worse with: walking and standing Pain improves with: rest and medication Relief from Meds: 6  Mobility walk with assistance use a cane use a walker  Function disabled: date disabled n/a I need assistance with the following:  household duties  Neuro/Psych weakness numbness depression  Prior Studies Any changes since last visit?  no  Physicians involved in your care Any changes since last visit?  no   Family History  Problem Relation Age of Onset  . Heart failure Mother   . Pneumonia Mother   . Hypertension Mother   . Heart disease Mother   . Stroke Maternal Grandfather   . Hypertension Maternal Grandfather   . Heart attack Father   . Heart disease Father   . Asthma Paternal Uncle        PAT UNCLES  . Heart attack Paternal Uncle    Social History   Socioeconomic History  . Marital status: Married    Spouse name: Not on file  . Number of children: Not on file  . Years of education: Not on file  . Highest education level: Not on file  Occupational History  . Not on file  Social Needs  . Financial resource strain: Not on file  . Food insecurity:    Worry: Not on file    Inability: Not on file  . Transportation needs:   Medical: Not on file    Non-medical: Not on file  Tobacco Use  . Smoking status: Never Smoker  . Smokeless tobacco: Never Used  Substance and Sexual Activity  . Alcohol use: No    Alcohol/week: 0.0 standard drinks  . Drug use: No  . Sexual activity: Never    Birth control/protection: Surgical  Lifestyle  . Physical activity:    Days per week: Not on file    Minutes per session: Not on file  . Stress: Not on file  Relationships  . Social connections:    Talks on phone: Not on file    Gets together: Not on file    Attends religious service: Not on file    Active member of club or organization: Not on file    Attends meetings of clubs or organizations: Not on file    Relationship status: Not on file  Other Topics Concern  . Not on file  Social History Narrative  . Not on file   Past Surgical History:  Procedure Laterality Date  . ANKLE SURGERY Left    ligament  . APPENDECTOMY  1987   AT [facility]  . BACK SURGERY     Fusion  . BREAST LUMPECTOMY  2003   radiation on right  . BREAST  LUMPECTOMY WITH RADIOACTIVE SEED LOCALIZATION Left 02/12/2016   Procedure: LEFT BREAST LUMPECTOMY WITH RADIOACTIVE SEED LOCALIZATION;  Surgeon: Excell Seltzer, MD;  Location: [facility];  Service: General;  Laterality: Left;  . CARDIOVASCULAR STRESS TEST  09/07/05   Nuclear, was negative  . CATARACT EXTRACTION Bilateral 01,03  . OOPHORECTOMY     LSO -RSO  . PELVIC LAPAROSCOPY  1989   RSO, LYSIS OF ADHESIONS  . TOTAL ABDOMINAL HYSTERECTOMY  1987   LSO, APPENDECTOMY  . TOTAL HIP ARTHROPLASTY Right 10/28/2013   dr Lorin Mercy  . TOTAL HIP ARTHROPLASTY Right 10/28/2013   Procedure: TOTAL HIP ARTHROPLASTY ANTERIOR APPROACH;  Surgeon: Marybelle Killings, MD;  Location: [facility];  Service: Orthopedics;  Laterality: Right;  Right Total Hip Arthroplasty, Direct Anterior Approach   Past Medical History:  Diagnosis Date  . Anemia    hx  . Arthritis   . Asthma    PFTs, February, 2011, moderate  obstructive disease with response to bronchodilators, normal lung volumes, moderate reduction in diffusing capacity  . Atrial septal aneurysm    Echo, 2008-not noted on 13 echo  . CAD (coronary artery disease)    90% distal LAD in the past  /   nuclear, 2008, no ischemia, ejection fraction 70%  . Cancer ([place]) 2002   DUCTAL CIS--S/P LUMPECTOMY, RADIATION AND 6 WEEKS OF TAMOXIFEN  . D-dimer, elevated    January, 2014  . Depression   . Ejection fraction    EF 60%, echo, October, 2008  . Elevated CPK    January, 2014  . Endometriosis 1989   RIGHT TUBE  . Endometriosis 1987   LEFT TUBE/OVARY W FOCAL IN-SITU ENDOMETRIAL ADENOCARCINOMA  . GERD (gastroesophageal reflux disease)    occ  . H/O hiatal hernia    ?  [name] Hyperlipidemia   . Hypertension   . Hypothyroidism    Patient has had in the past that she does not need treatment  . Kyphoscoliosis   . Obstructive airway disease ([place])   . Pinched nerve    lower back  . Shortness of breath   . UTI (lower urinary tract infection)    BP (!) 147/87   Pulse 65   Ht 5' (1.524 m)   Wt 166 lb (75.3 kg)   SpO2 94%   BMI 32.42 kg/m   Opioid Risk Score:   Fall Risk Score:  `1  Depression screen PHQ 2/9  Depression screen [facility] 2/9 01/17/2018 06/02/2017 05/03/2017 01/27/2017 01/10/2017 01/02/2017 09/08/2016  Decreased Interest 1 1 1 1  0 0 0  Down, Depressed, Hopeless 1 1 1 1 2 1 1   PHQ - 2 Score 2 2 2 2 2 1 1   Altered sleeping - - - - 1 - -  Tired, decreased energy - - - - 1 - -  Change in appetite - - - - 0 - -  Feeling bad or failure about yourself  - - - - 1 - -  Trouble concentrating - - - - 0 - -  Moving slowly or fidgety/restless - - - - 0 - -  Suicidal thoughts - - - - 0 - -  PHQ-9 Score - - - - 5 - -  Difficult doing work/chores - - - - Not difficult at all - -  Some recent data might be hidden    Review of Systems  Constitutional: Negative.   HENT: Negative.   Eyes: Negative.   Respiratory: Negative.   Cardiovascular:  Negative.   Gastrointestinal:  Negative.   Endocrine: Negative.   Genitourinary: Negative.   Musculoskeletal: Negative.   Skin: Negative.   Allergic/Immunologic: Negative.   Neurological: Negative.   Hematological: Negative.   Psychiatric/Behavioral: Negative.   All other systems reviewed and are negative.      Objective:   Physical Exam  Constitutional: She is oriented to person, place, and time. She appears well-developed and well-nourished.  HENT:  Head: Normocephalic and atraumatic.  Neck: Normal range of motion. Neck supple.  Cardiovascular: Normal rate and regular rhythm.  Pulmonary/Chest: Effort normal and breath sounds normal.  Musculoskeletal:  Normal Muscle Bulk and Muscle Testing Reveals: Upper Extremities: Full ROM and Muscle Strength 5.5 Lumbar Paraspinal Tenderness: L-3-L-5 Lower Extremities: Full ROM and Muscle Strength 5/5 Arises from Table Slowly using walker for support Narrow Based gait  Neurological: She is alert and oriented to person, place, and time.  Skin: Skin is warm and dry.  Psychiatric: She has a normal mood and affect. Her behavior is normal.  Nursing note and vitals reviewed.         Assessment & Plan:  1 Lumbar post laminectomy syndrome, s/p lumbar fusion: 01/15/2018 Continue current medication regimen.Refilled: Hydrocodone 10/325mg  one tablet every 6 hours #100. We will continue the opioid monitoring program, this consists of regular clinic visits, examinations, urine drug screen, pill counts as well as use of [place] Controlled Substance Reporting system. 2. Right Hip OA: S/P Right Hip Replacement 10/28/2013. 01/15/2018 3.Depression: Continue Cymbalta. Has Family Support and Friends. Counseling with Doristine Bosworth. 01/15/2018 4.BilateralGreater Trochanteric Tenderness: No complaints Today.Continue to Monitor. Continue with Ice and Heat Therapy.01/15/2018  15 minutes of face to face patient care time was spent during this visit. All  questions were encouraged and answered.  F/U in 1 month

## 2018-01-19 ENCOUNTER — Encounter: Payer: Self-pay | Admitting: Internal Medicine

## 2018-01-25 ENCOUNTER — Encounter: Payer: Self-pay | Admitting: Cardiology

## 2018-02-02 ENCOUNTER — Ambulatory Visit (HOSPITAL_COMMUNITY): Payer: Medicare Other | Attending: Cardiology

## 2018-02-02 ENCOUNTER — Other Ambulatory Visit: Payer: Self-pay

## 2018-02-02 DIAGNOSIS — R011 Cardiac murmur, unspecified: Secondary | ICD-10-CM

## 2018-02-03 ENCOUNTER — Encounter: Payer: Self-pay | Admitting: Internal Medicine

## 2018-02-07 DIAGNOSIS — H524 Presbyopia: Secondary | ICD-10-CM | POA: Diagnosis not present

## 2018-02-08 ENCOUNTER — Encounter: Payer: Self-pay | Admitting: Cardiology

## 2018-02-08 ENCOUNTER — Ambulatory Visit: Payer: Medicare Other | Admitting: Cardiology

## 2018-02-08 VITALS — BP 140/80 | HR 82 | Ht 60.0 in | Wt 169.0 lb

## 2018-02-08 DIAGNOSIS — R0609 Other forms of dyspnea: Secondary | ICD-10-CM | POA: Diagnosis not present

## 2018-02-08 DIAGNOSIS — I2510 Atherosclerotic heart disease of native coronary artery without angina pectoris: Secondary | ICD-10-CM | POA: Diagnosis not present

## 2018-02-08 DIAGNOSIS — R072 Precordial pain: Secondary | ICD-10-CM

## 2018-02-08 DIAGNOSIS — R5383 Other fatigue: Secondary | ICD-10-CM

## 2018-02-08 DIAGNOSIS — E782 Mixed hyperlipidemia: Secondary | ICD-10-CM

## 2018-02-08 DIAGNOSIS — I10 Essential (primary) hypertension: Secondary | ICD-10-CM | POA: Diagnosis not present

## 2018-02-08 DIAGNOSIS — R0600 Dyspnea, unspecified: Secondary | ICD-10-CM

## 2018-02-08 MED ORDER — METOPROLOL TARTRATE 50 MG PO TABS: 75.0000 mg | ORAL_TABLET | Freq: Two times a day (BID) | ORAL | 3 refills | Status: DC

## 2018-02-08 NOTE — Progress Notes (Signed)
Patient ID: Hannah Scott, female   DOB: [birth year], 67 y.o.   MRN: [id number]      Cardiology Office Note  Date:  02/08/2018   ID:  Hannah Scott, DOB [birth year], MRN [id number]  PCP:  Binnie Rail, MD  Cardiologist:  Ena Dawley, MD , previously Dr Ron Parker  Chief complain: Re-establish care   History of Present Illness:  Hannah Scott is a 67 y.o. female with h/o 2 prior MIs and to cardiac catheterization the last one in 1999. She was told at the time that her myocardial infarction was result of coronary spasm and she didn't get any stents placed. She has known distal LAD disease.  She has been followed by Dr. Ron Parker and was stable, her last stress test was done in December 2014 where she had mild ischemia and conservative management was decided. She was taking amlodipine for years but developed significant lower extremity edema that resolved after she discontinued. In December 2016 she was hospitalized with acute respiratory failure secondary to asthma attack triggered by influenza A. She states that despite recovery from these her shortness of breath has never resolved. She feels short of breath intermittently on exertion and at rest. She is currently not wheezing and doesn't need to use rescue inhalers.  02/01/16 - 6 months follow up, the patient has been doing well, denies any chest pain and has stable dyspnea on exertion. No recent asthma attacks or hospitalization. She has been compliant to her meds and tolerates them well. She underwent Lexiscan nuclear stress test in March 2017 that was negative for ischemia, normal LVEF. She also denies any recent LE edema, orthopnea no palpitations or syncope.  She walks with a walker very slowly but still able to perform all activities of daily living including shopping.  She has history of breast cancer and recently was found to have a new lump in her left breast, she is scheduled for lumpectomy on 02/12/2016.  06/19/2017 - the patient is coming after a  year, in the interim she has underwent breast lumpectomy that showed a benign lesion. She also underwent a lumbar laminectomy as a consequence of lifelong scoliosis. She denies any chest pain, she has stable dyspnea on exertion and shortness of breath at rest that she attributes to her asthma, this has been stable. She denies any palpitations dizziness or syncope. She is no lower extremity edema no claudications.  02/08/2018 -the patient is coming after 6 months, she has been experiencing worsening fatigue and dyspnea on exertion, she lives independently and able to take care of all activities of daily living however unable to do things like gardening or anything more strenuous.  She has been compliant with her medications and tolerates them well.  She brings her blood pressure log and there are a lot of blood pressure in 140s.  Past Medical History:  Diagnosis Date  . Anemia    hx  . Arthritis   . Asthma    PFTs, February, 2011, moderate obstructive disease with response to bronchodilators, normal lung volumes, moderate reduction in diffusing capacity  . Atrial septal aneurysm    Echo, 2008-not noted on 13 echo  . CAD (coronary artery disease)    90% distal LAD in the past  /   nuclear, 2008, no ischemia, ejection fraction 70%  . Cancer ([place]) 2002   DUCTAL CIS--S/P LUMPECTOMY, RADIATION AND 6 WEEKS OF TAMOXIFEN  . D-dimer, elevated    January, 2014  . Depression   . Ejection  fraction    EF 60%, echo, October, 2008  . Elevated CPK    January, 2014  . Endometriosis 1989   RIGHT TUBE  . Endometriosis 1987   LEFT TUBE/OVARY W FOCAL IN-SITU ENDOMETRIAL ADENOCARCINOMA  . GERD (gastroesophageal reflux disease)    occ  . H/O hiatal hernia    ?  [name] Hyperlipidemia   . Hypertension   . Hypothyroidism    Patient has had in the past that she does not need treatment  . Kyphoscoliosis   . Obstructive airway disease ([place])   . Pinched nerve    lower back  . Shortness of breath   . UTI (lower  urinary tract infection)    Past Surgical History:  Procedure Laterality Date  . ANKLE SURGERY Left    ligament  . APPENDECTOMY  1987   AT [facility]  . BACK SURGERY     Fusion  . BREAST LUMPECTOMY  2003   radiation on right  . BREAST LUMPECTOMY WITH RADIOACTIVE SEED LOCALIZATION Left 02/12/2016   Procedure: LEFT BREAST LUMPECTOMY WITH RADIOACTIVE SEED LOCALIZATION;  Surgeon: Excell Seltzer, MD;  Location: [facility];  Service: General;  Laterality: Left;  . CARDIOVASCULAR STRESS TEST  09/07/05   Nuclear, was negative  . CATARACT EXTRACTION Bilateral 01,03  . OOPHORECTOMY     LSO -RSO  . PELVIC LAPAROSCOPY  1989   RSO, LYSIS OF ADHESIONS  . TOTAL ABDOMINAL HYSTERECTOMY  1987   LSO, APPENDECTOMY  . TOTAL HIP ARTHROPLASTY Right 10/28/2013   dr Lorin Mercy  . TOTAL HIP ARTHROPLASTY Right 10/28/2013   Procedure: TOTAL HIP ARTHROPLASTY ANTERIOR APPROACH;  Surgeon: Marybelle Killings, MD;  Location: [facility];  Service: Orthopedics;  Laterality: Right;  Right Total Hip Arthroplasty, Direct Anterior Approach    Current Outpatient Medications  Medication Sig Dispense Refill  . albuterol (PROVENTIL HFA;VENTOLIN HFA) 108 (90 Base) MCG/ACT inhaler Inhale 2 puffs into the lungs every 4 (four) hours as needed for wheezing or shortness of breath. 1 Inhaler 0  . atorvastatin (LIPITOR) 20 MG tablet Take 1 tablet (20 mg total) by mouth daily. 90 tablet 3  . benazepril (LOTENSIN) 40 MG tablet Take 1 tablet (40 mg total) by mouth daily. 90 tablet 3  . DULoxetine (CYMBALTA) 60 MG capsule Take 60 mg by mouth daily.    . fluticasone furoate-vilanterol (BREO ELLIPTA) 100-25 MCG/INH AEPB Inhale 1 puff into the lungs daily.    [name] gabapentin (NEURONTIN) 600 MG tablet TAKE ONE TABLET BY MOUTH AT BEDTIME 30 tablet 3  . HYDROcodone-acetaminophen (NORCO) 10-325 MG tablet Take 1 tablet by mouth every 8 (eight) hours as needed. 100 tablet 0  . ipratropium-albuterol (DUONEB) 0.5-2.5 (3) MG/3ML SOLN Take 3 mLs by  nebulization every 4 (four) hours as needed (wheezing or SOB). During exacerbation 360 mL 0  . levothyroxine (SYNTHROID, LEVOTHROID) 75 MCG tablet TAKE ONE TABLET BY MOUTH DAILY 90 tablet 1  . metoprolol tartrate (LOPRESSOR) 50 MG tablet TAKE ONE TABLET BY MOUTH TWICE DAILY 180 tablet 3   No current facility-administered medications for this visit.     Allergies:   Fluticasone-salmeterol; Norvasc [amlodipine besylate]; and Tape    Social History:  The patient  reports that she has never smoked. She has never used smokeless tobacco. She reports that she does not drink alcohol or use drugs.   Family History:  The patient's family history includes Asthma in her paternal uncle; Heart attack in her father and paternal uncle; Heart disease in her father  and mother; Heart failure in her mother; Hypertension in her maternal grandfather and mother; Pneumonia in her mother; Stroke in her maternal grandfather.   ROS:  Please see the history of present illness.   Otherwise, review of systems are positive for none.   All other systems are reviewed and negative.   PHYSICAL EXAM: VS:  BP 140/80   Pulse 82   Ht 5' (1.524 m)   Wt 169 lb (76.7 kg)   SpO2 94%   BMI 33.01 kg/m  , BMI Body mass index is 33.01 kg/m. GEN: Well nourished, well developed, in no acute distress  HEENT: normal  Neck: no JVD, carotid bruits, or masses Cardiac: RRR; no murmurs, rubs, or gallops,no edema  Respiratory:  clear to auscultation bilaterally, normal work of breathing GI: soft, nontender, nondistended, + BS MS: no deformity or atrophy  Skin: warm and dry, no rash Neuro:  Strength and sensation are intact Psych: euthymic mood, full affect  EKG:  EKG is ordered today and personally reviewed, and she was normal sinus rhythm, negative T waves in the lateral leads, unchanged from prior, personally reviewed.  Recent Labs: 01/16/2018: ALT 13; BUN 12; Creatinine, Ser 0.56; Hemoglobin 12.3; Platelets 271.0; Potassium 4.5;  Sodium 140; TSH 0.58   Lipid Panel    Component Value Date/Time   CHOL 152 07/12/2017 1556   TRIG 75.0 07/12/2017 1556   HDL 70.60 07/12/2017 1556   CHOLHDL 2 07/12/2017 1556   VLDL 15.0 07/12/2017 1556   LDLCALC 66 07/12/2017 1556   LDLDIRECT 154.8 02/20/2009 1116     Wt Readings from Last 3 Encounters:  02/08/18 169 lb (76.7 kg)  01/17/18 166 lb (75.3 kg)  01/16/18 167 lb (75.8 kg)    TTE: 08/31/2011 Left ventricle: The cavity size was normal. Wall thickness was increased in a pattern of mild LVH. Systolic function was normal. The estimated ejection fraction was 55%. Wall motion was normal; there were no regional wall motion abnormalities. - Left atrium: The atrium was mildly dilated. - Atrial septum: No defect or patent foramen ovale was identified. - Pulmonary arteries: PA peak pressure: 60mm Hg (S).  Lexiscan nuclear stress test: 06/30/2015  Nuclear stress EF: 55%.  There was no ST segment deviation noted during stress.  Defect 1: There is a small defect of mild severity present in the apical anterior location. This may represent distal LAD disease vs. apical thinning. There is no evidence of reversible ischemia  This is a low risk study.    ASSESSMENT AND PLAN:  1. Coronary artery disease  - known LAD disease and prior myocardial infarctions, Normal stress test on 06/30/2015, now worsening symptoms, we will obtain coronary CTA.   2. Hyperlipidemia  - on atorvastatin 20 mg daily, tolerated well, will continue.  3. Hypertension  - uncontrolled, I will increase metoprolol to 75 mg p.o. twice daily.  4. Hypothyroidism on Synthroid, most recent TSH normal.  Follow up in 4 weeks for blood pressure control, if nonobstructive CAD then follow-up with me in 1 year.  Signed, Ena Dawley, MD  02/08/2018 3:50 PM    [facility] [address], [place], [place]  [address] Phone: [phone]; Fax: [phone]

## 2018-02-08 NOTE — Patient Instructions (Signed)
Medication Instructions:   INCREASE YOUR METOPROLOL  TARTRATE TO 75 MG BY MOUTH TWICE DAILY  If you need a refill on your cardiac medications before your next appointment, please call your pharmacy.      Testing/Procedures:  Please arrive at the [facility] main entrance of [facility] at xx:xx AM (30-45 minutes prior to test start time)  [facility] [place], [place] [address] [phone]  Proceed to the [facility] Radiology Department (First Floor).  Please follow these instructions carefully (unless otherwise directed):    On the Night Before the Test: . Be sure to Drink plenty of water. . Do not consume any caffeinated/decaffeinated beverages or chocolate 12 hours prior to your test. . Do not take any antihistamines 12 hours prior to your test. .  On the Day of the Test: . Drink plenty of water. Do not drink any water within one hour of the test. . Do not eat any food 4 hours prior to the test. . You may take your regular medications prior to the test.  . Take metoprolol (Lopressor) two hours prior to test.  TAKE 100 MG OF YOUR METOPROLOL TARTRATE THE MORNING OF YOUR CORONARY CT (THAT MEANS YOU WILL TAKE 2 TABS 2 HOURS PRIOR TO YOUR CT)       After the Test: . Drink plenty of water. . After receiving IV contrast, you may experience a mild flushed feeling. This is normal. . On occasion, you may experience a mild rash up to 24 hours after the test. This is not dangerous. If this occurs, you can take Benadryl 25 mg and increase your fluid intake. . If you experience trouble breathing, this can be serious. If it is severe call 911 IMMEDIATELY. If it is mild, please call our office.    Follow-Up:  4 WEEKS WITH OUR PHARMACIST IN [facility]   Your physician wants you to follow-up in: [place] will receive a reminder letter in the mail two months in advance. If you don't receive a letter, please call our office to  schedule the follow-up appointment.

## 2018-02-15 ENCOUNTER — Encounter: Payer: Medicare Other | Attending: Physical Medicine & Rehabilitation | Admitting: Registered Nurse

## 2018-02-15 ENCOUNTER — Encounter: Payer: Self-pay | Admitting: Registered Nurse

## 2018-02-15 VITALS — BP 130/73 | HR 63 | Ht 60.0 in | Wt 167.0 lb

## 2018-02-15 DIAGNOSIS — M961 Postlaminectomy syndrome, not elsewhere classified: Secondary | ICD-10-CM | POA: Insufficient documentation

## 2018-02-15 DIAGNOSIS — Z79899 Other long term (current) drug therapy: Secondary | ICD-10-CM

## 2018-02-15 DIAGNOSIS — M419 Scoliosis, unspecified: Secondary | ICD-10-CM

## 2018-02-15 DIAGNOSIS — M1611 Unilateral primary osteoarthritis, right hip: Secondary | ICD-10-CM | POA: Insufficient documentation

## 2018-02-15 DIAGNOSIS — Z5181 Encounter for therapeutic drug level monitoring: Secondary | ICD-10-CM

## 2018-02-15 DIAGNOSIS — M48061 Spinal stenosis, lumbar region without neurogenic claudication: Secondary | ICD-10-CM | POA: Insufficient documentation

## 2018-02-15 DIAGNOSIS — M24551 Contracture, right hip: Secondary | ICD-10-CM

## 2018-02-15 DIAGNOSIS — G894 Chronic pain syndrome: Secondary | ICD-10-CM

## 2018-02-15 DIAGNOSIS — M4126 Other idiopathic scoliosis, lumbar region: Secondary | ICD-10-CM | POA: Diagnosis not present

## 2018-02-15 DIAGNOSIS — M48062 Spinal stenosis, lumbar region with neurogenic claudication: Secondary | ICD-10-CM

## 2018-02-15 MED ORDER — HYDROCODONE-ACETAMINOPHEN 10-325 MG PO TABS: 1.0000 | ORAL_TABLET | Freq: Three times a day (TID) | ORAL | 0 refills | Status: DC | PRN

## 2018-02-15 NOTE — Progress Notes (Signed)
Subjective:    Patient ID: Hannah Scott, female    DOB: [birth year], 67 y.o.   MRN: [id number]  HPI: [name] is a 67 year old female who returns for follow up appointment for chronic pain and medication refill. She states her pain is located in her lower back. She rates her pain 7. Her current exercise regime is walking.   Hannah Scott equivalent is 33.33 MME. Last UDS was Performed on 10/12/2017, it was consistent.   Pain Inventory Average Pain 7 Pain Right Now 7 My pain is constant, burning, tingling and aching  In the last 24 hours, has pain interfered with the following? General activity 10 Relation with others 10 Enjoyment of life 10 What TIME of day is your pain at its worst? all Sleep (in general) Poor  Pain is worse with: walking and standing Pain improves with: rest Relief from Meds: 5  Mobility use a cane use a walker ability to climb steps?  yes do you drive?  yes  Function disabled: date disabled .  Neuro/Psych weakness numbness tingling trouble walking depression  Prior Studies Any changes since last visit?  no  Physicians involved in your care Any changes since last visit?  no   Family History  Problem Relation Age of Onset  . Heart failure Mother   . Pneumonia Mother   . Hypertension Mother   . Heart disease Mother   . Stroke Maternal Grandfather   . Hypertension Maternal Grandfather   . Heart attack Father   . Heart disease Father   . Asthma Paternal Uncle        PAT UNCLES  . Heart attack Paternal Uncle    Social History   Socioeconomic History  . Marital status: Married    Spouse name: Not on file  . Number of children: Not on file  . Years of education: Not on file  . Highest education level: Not on file  Occupational History  . Not on file  Social Needs  . Financial resource strain: Not on file  . Food insecurity:    Worry: Not on file    Inability: Not on file  . Transportation needs:    Medical: Not on file     Non-medical: Not on file  Tobacco Use  . Smoking status: Never Smoker  . Smokeless tobacco: Never Used  Substance and Sexual Activity  . Alcohol use: No    Alcohol/week: 0.0 standard drinks  . Drug use: No  . Sexual activity: Never    Birth control/protection: Surgical  Lifestyle  . Physical activity:    Days per week: Not on file    Minutes per session: Not on file  . Stress: Not on file  Relationships  . Social connections:    Talks on phone: Not on file    Gets together: Not on file    Attends religious service: Not on file    Active member of club or organization: Not on file    Attends meetings of clubs or organizations: Not on file    Relationship status: Not on file  Other Topics Concern  . Not on file  Social History Narrative  . Not on file   Past Surgical History:  Procedure Laterality Date  . ANKLE SURGERY Left    ligament  . APPENDECTOMY  1987   AT [facility]  . BACK SURGERY     Fusion  . BREAST LUMPECTOMY  2003   radiation on right  .  BREAST LUMPECTOMY WITH RADIOACTIVE SEED LOCALIZATION Left 02/12/2016   Procedure: LEFT BREAST LUMPECTOMY WITH RADIOACTIVE SEED LOCALIZATION;  Surgeon: Excell Seltzer, MD;  Location: [facility];  Service: General;  Laterality: Left;  . CARDIOVASCULAR STRESS TEST  09/07/05   Nuclear, was negative  . CATARACT EXTRACTION Bilateral 01,03  . OOPHORECTOMY     LSO -RSO  . PELVIC LAPAROSCOPY  1989   RSO, LYSIS OF ADHESIONS  . TOTAL ABDOMINAL HYSTERECTOMY  1987   LSO, APPENDECTOMY  . TOTAL HIP ARTHROPLASTY Right 10/28/2013   dr Lorin Mercy  . TOTAL HIP ARTHROPLASTY Right 10/28/2013   Procedure: TOTAL HIP ARTHROPLASTY ANTERIOR APPROACH;  Surgeon: Marybelle Killings, MD;  Location: [facility];  Service: Orthopedics;  Laterality: Right;  Right Total Hip Arthroplasty, Direct Anterior Approach   Past Medical History:  Diagnosis Date  . Anemia    hx  . Arthritis   . Asthma    PFTs, February, 2011, moderate obstructive disease with  response to bronchodilators, normal lung volumes, moderate reduction in diffusing capacity  . Atrial septal aneurysm    Echo, 2008-not noted on 13 echo  . CAD (coronary artery disease)    90% distal LAD in the past  /   nuclear, 2008, no ischemia, ejection fraction 70%  . Cancer ([place]) 2002   DUCTAL CIS--S/P LUMPECTOMY, RADIATION AND 6 WEEKS OF TAMOXIFEN  . D-dimer, elevated    January, 2014  . Depression   . Ejection fraction    EF 60%, echo, October, 2008  . Elevated CPK    January, 2014  . Endometriosis 1989   RIGHT TUBE  . Endometriosis 1987   LEFT TUBE/OVARY W FOCAL IN-SITU ENDOMETRIAL ADENOCARCINOMA  . GERD (gastroesophageal reflux disease)    occ  . H/O hiatal hernia    ?  [name] Hyperlipidemia   . Hypertension   . Hypothyroidism    Patient has had in the past that she does not need treatment  . Kyphoscoliosis   . Obstructive airway disease ([place])   . Pinched nerve    lower back  . Shortness of breath   . UTI (lower urinary tract infection)    Ht 5' (1.524 m)   Wt 167 lb (75.8 kg)   BMI 32.61 kg/m   Opioid Risk Score:   Fall Risk Score:  `1  Depression screen PHQ 2/9  Depression screen [facility] 2/9 01/17/2018 06/02/2017 05/03/2017 01/27/2017 01/10/2017 01/02/2017 09/08/2016  Decreased Interest 1 1 1 1  0 0 0  Down, Depressed, Hopeless 1 1 1 1 2 1 1   PHQ - 2 Score 2 2 2 2 2 1 1   Altered sleeping - - - - 1 - -  Tired, decreased energy - - - - 1 - -  Change in appetite - - - - 0 - -  Feeling bad or failure about yourself  - - - - 1 - -  Trouble concentrating - - - - 0 - -  Moving slowly or fidgety/restless - - - - 0 - -  Suicidal thoughts - - - - 0 - -  PHQ-9 Score - - - - 5 - -  Difficult doing work/chores - - - - Not difficult at all - -  Some recent data might be hidden     Review of Systems  Constitutional: Negative.   HENT: Negative.   Eyes: Negative.   Respiratory: Negative.   Cardiovascular: Negative.   Gastrointestinal: Negative.   Endocrine: Negative.     Genitourinary: Negative.  Musculoskeletal: Positive for arthralgias, back pain, gait problem and myalgias.  Skin: Negative.   Allergic/Immunologic: Negative.   Neurological: Positive for numbness.  Hematological: Negative.   Psychiatric/Behavioral: Positive for dysphoric mood.  All other systems reviewed and are negative.      Objective:   Physical Exam  Constitutional: She is oriented to person, place, and time. She appears well-developed and well-nourished.  HENT:  Head: Normocephalic and atraumatic.  Neck: Normal range of motion. Neck supple.  Cardiovascular: Normal rate and regular rhythm.  Pulmonary/Chest: Effort normal and breath sounds normal.  Musculoskeletal:  Normal Muscle Bulk and Muscle Testing Reveals: Upper Extremities: Full ROM and Muscle Strength 5/5 Lumbar Paraspinal Tenderness: L-4-L-5 Lower Extremities: Full ROM an Muscle Strength 5/5 Arises from Table with ease Narrow Based gait  Neurological: She is alert and oriented to person, place, and time.  Skin: Skin is warm and dry.  Psychiatric: She has a normal mood and affect. Her behavior is normal.  Nursing note and vitals reviewed.         Assessment & Plan:  1 Lumbar post laminectomy syndrome, s/p lumbar fusion: 02/15/2018 Continue current medication regimen.Refilled: Hydrocodone 10/325mg  one tablet every 6 hours #100. We will continue the opioid monitoring program, this consists of regular clinic visits, examinations, urine drug screen, pill counts as well as use of [place] Controlled Substance Reporting system. 2. Right Hip OA: S/P Right Hip Replacement 10/28/2013. 02/15/2018 3.Depression: Continue Cymbalta. Has Family Support and Friends. Counseling with Doristine Bosworth. 02/15/2018 4.BilateralGreater Trochanteric Tenderness: No complaints Today.Continue to Monitor. Continue with Ice and Heat Therapy.02/15/2018  15 minutes of face to face patient care time was spent during this visit. All  questions were encouraged and answered.  F/U in 1 month

## 2018-03-08 ENCOUNTER — Ambulatory Visit: Payer: Medicare Other

## 2018-03-16 ENCOUNTER — Encounter: Payer: Self-pay | Admitting: Physical Medicine & Rehabilitation

## 2018-03-16 ENCOUNTER — Ambulatory Visit: Payer: Medicare Other | Admitting: Physical Medicine & Rehabilitation

## 2018-03-16 ENCOUNTER — Ambulatory Visit (INDEPENDENT_AMBULATORY_CARE_PROVIDER_SITE_OTHER): Payer: Medicare Other

## 2018-03-16 ENCOUNTER — Encounter: Payer: Medicare Other | Attending: Physical Medicine & Rehabilitation

## 2018-03-16 VITALS — BP 135/85 | HR 62 | Ht 60.0 in | Wt 160.0 lb

## 2018-03-16 DIAGNOSIS — M961 Postlaminectomy syndrome, not elsewhere classified: Secondary | ICD-10-CM

## 2018-03-16 DIAGNOSIS — M24551 Contracture, right hip: Secondary | ICD-10-CM

## 2018-03-16 DIAGNOSIS — Z23 Encounter for immunization: Secondary | ICD-10-CM

## 2018-03-16 DIAGNOSIS — M419 Scoliosis, unspecified: Secondary | ICD-10-CM | POA: Diagnosis not present

## 2018-03-16 DIAGNOSIS — M48061 Spinal stenosis, lumbar region without neurogenic claudication: Secondary | ICD-10-CM | POA: Diagnosis not present

## 2018-03-16 DIAGNOSIS — M1611 Unilateral primary osteoarthritis, right hip: Secondary | ICD-10-CM | POA: Diagnosis not present

## 2018-03-16 DIAGNOSIS — M4126 Other idiopathic scoliosis, lumbar region: Secondary | ICD-10-CM | POA: Diagnosis not present

## 2018-03-16 MED ORDER — HYDROCODONE-ACETAMINOPHEN 10-325 MG PO TABS: 1.0000 | ORAL_TABLET | Freq: Three times a day (TID) | ORAL | 0 refills | Status: DC | PRN

## 2018-03-16 MED ORDER — GABAPENTIN 600 MG PO TABS: 600.0000 mg | ORAL_TABLET | Freq: Every day | ORAL | 3 refills | Status: DC

## 2018-03-16 NOTE — Progress Notes (Signed)
Subjective:    Patient ID: Hannah Scott, female    DOB: 1950/10/17, 67 y.o.   MRN: [id number] Hx Lumbar fusion L4-5 ~2008 as well as Right THA 6350 67 year old female with primary complaints of low back and bilateral hip pain.  She underwent right total hip arthroplasty for hip pain but this only helped her pain partially.  She has developed some increasing pain in the left hip.  HPI No further worsening of Left hip pain No falls Uses cane only for short distances but will use walker for longer household distances No numbnes or tingling in legs Right hand tingling,little  Scoliosis diagnosed at age 67 but was told it would not progress  Pain Inventory Average Pain 8 Pain Right Now 7 My pain is burning, tingling and aching  In the last 24 hours, has pain interfered with the following? General activity 10 Relation with others 10 Enjoyment of life 10 What TIME of day is your pain at its worst? na Sleep (in general) Poor  Pain is worse with: walking and standing Pain improves with: medication Relief from Meds: 6 No bowel issues Mobility use a cane use a walker ability to climb steps?  yes do you drive?  yes  Function disabled: date disabled .  Neuro/Psych weakness numbness tingling trouble walking depression  Prior Studies Any changes since last visit?  no  Physicians involved in your care Any changes since last visit?  no   Family History  Problem Relation Age of Onset  . Heart failure Mother   . Pneumonia Mother   . Hypertension Mother   . Heart disease Mother   . Stroke Maternal Grandfather   . Hypertension Maternal Grandfather   . Heart attack Father   . Heart disease Father   . Asthma Paternal Uncle        PAT UNCLES  . Heart attack Paternal Uncle    Social History   Socioeconomic History  . Marital status: Married    Spouse name: Not on file  . Number of children: Not on file  . Years of education: Not on file  . Highest education level: Not  on file  Occupational History  . Not on file  Social Needs  . Financial resource strain: Not on file  . Food insecurity:    Worry: Not on file    Inability: Not on file  . Transportation needs:    Medical: Not on file    Non-medical: Not on file  Tobacco Use  . Smoking status: Never Smoker  . Smokeless tobacco: Never Used  Substance and Sexual Activity  . Alcohol use: No    Alcohol/week: 0.0 standard drinks  . Drug use: No  . Sexual activity: Never    Birth control/protection: Surgical  Lifestyle  . Physical activity:    Days per week: Not on file    Minutes per session: Not on file  . Stress: Not on file  Relationships  . Social connections:    Talks on phone: Not on file    Gets together: Not on file    Attends religious service: Not on file    Active member of club or organization: Not on file    Attends meetings of clubs or organizations: Not on file    Relationship status: Not on file  Other Topics Concern  . Not on file  Social History Narrative  . Not on file   Past Surgical History:  Procedure Laterality Date  . ANKLE SURGERY  Left    ligament  . APPENDECTOMY  1987   AT [facility]  . BACK SURGERY     Fusion  . BREAST LUMPECTOMY  2003   radiation on right  . BREAST LUMPECTOMY WITH RADIOACTIVE SEED LOCALIZATION Left 02/12/2016   Procedure: LEFT BREAST LUMPECTOMY WITH RADIOACTIVE SEED LOCALIZATION;  Surgeon: Excell Seltzer, MD;  Location: [facility];  Service: General;  Laterality: Left;  . CARDIOVASCULAR STRESS TEST  09/07/05   Nuclear, was negative  . CATARACT EXTRACTION Bilateral 01,03  . OOPHORECTOMY     LSO -RSO  . PELVIC LAPAROSCOPY  1989   RSO, LYSIS OF ADHESIONS  . TOTAL ABDOMINAL HYSTERECTOMY  1987   LSO, APPENDECTOMY  . TOTAL HIP ARTHROPLASTY Right 10/28/2013   dr Lorin Mercy  . TOTAL HIP ARTHROPLASTY Right 10/28/2013   Procedure: TOTAL HIP ARTHROPLASTY ANTERIOR APPROACH;  Surgeon: Marybelle Killings, MD;  Location: [facility] [facility];  Service: Orthopedics;   Laterality: Right;  Right Total Hip Arthroplasty, Direct Anterior Approach   Past Medical History:  Diagnosis Date  . Anemia    hx  . Arthritis   . Asthma    PFTs, February, 2011, moderate obstructive disease with response to bronchodilators, normal lung volumes, moderate reduction in diffusing capacity  . Atrial septal aneurysm    Echo, 2008-not noted on 13 echo  . CAD (coronary artery disease)    90% distal LAD in the past  /   nuclear, 2008, no ischemia, ejection fraction 70%  . Cancer ([place]) 2002   DUCTAL CIS--S/P LUMPECTOMY, RADIATION AND 6 WEEKS OF TAMOXIFEN  . D-dimer, elevated    January, 2014  . Depression   . Ejection fraction    EF 60%, echo, October, 2008  . Elevated CPK    January, 2014  . Endometriosis 1989   RIGHT TUBE  . Endometriosis 1987   LEFT TUBE/OVARY W FOCAL IN-SITU ENDOMETRIAL ADENOCARCINOMA  . GERD (gastroesophageal reflux disease)    occ  . H/O hiatal hernia    ?  [name] Hyperlipidemia   . Hypertension   . Hypothyroidism    Patient has had in the past that she does not need treatment  . Kyphoscoliosis   . Obstructive airway disease ([place])   . Pinched nerve    lower back  . Shortness of breath   . UTI (lower urinary tract infection)    BP 135/85   Pulse 62   Ht 5' (1.524 m)   Wt 160 lb (72.6 kg)   SpO2 (!) 89%   BMI 31.25 kg/m   Opioid Risk Score:   Fall Risk Score:  `1  Depression screen PHQ 2/9  Depression screen [facility] 2/9 01/17/2018 06/02/2017 05/03/2017 01/27/2017 01/10/2017 01/02/2017 09/08/2016  Decreased Interest 1 1 1 1  0 0 0  Down, Depressed, Hopeless 1 1 1 1 2 1 1   PHQ - 2 Score 2 2 2 2 2 1 1   Altered sleeping - - - - 1 - -  Tired, decreased energy - - - - 1 - -  Change in appetite - - - - 0 - -  Feeling bad or failure about yourself  - - - - 1 - -  Trouble concentrating - - - - 0 - -  Moving slowly or fidgety/restless - - - - 0 - -  Suicidal thoughts - - - - 0 - -  PHQ-9 Score - - - - 5 - -  Difficult doing work/chores - - - - Not  difficult at all - -  Some recent data might be hidden     Review of Systems  Constitutional: Negative.   HENT: Negative.   Eyes: Negative.   Respiratory: Negative.   Cardiovascular: Negative.   Gastrointestinal: Negative.   Endocrine: Negative.   Genitourinary: Negative.   Musculoskeletal: Positive for arthralgias, back pain, gait problem and myalgias.  Skin: Negative.   Allergic/Immunologic: Negative.   Neurological: Positive for weakness and numbness.  Hematological: Negative.   Psychiatric/Behavioral: Positive for dysphoric mood.  All other systems reviewed and are negative.      Objective:   Physical Exam Reduced LT R lat thigh and R 4th and 5th digits       Assessment & Plan:   Last UDS June 2019 appropriate PMP Aware reviewed today- appropriate

## 2018-04-11 ENCOUNTER — Encounter: Payer: Self-pay | Admitting: Internal Medicine

## 2018-04-11 ENCOUNTER — Other Ambulatory Visit: Payer: Self-pay | Admitting: Internal Medicine

## 2018-04-12 MED ORDER — IPRATROPIUM-ALBUTEROL 0.5-2.5 (3) MG/3ML IN SOLN: 3.0000 mL | RESPIRATORY_TRACT | 1 refills | Status: DC | PRN

## 2018-04-13 ENCOUNTER — Other Ambulatory Visit: Payer: Medicare Other

## 2018-04-13 ENCOUNTER — Encounter: Payer: Medicare Other | Admitting: Registered Nurse

## 2018-04-13 DIAGNOSIS — R0600 Dyspnea, unspecified: Secondary | ICD-10-CM

## 2018-04-13 DIAGNOSIS — R5383 Other fatigue: Secondary | ICD-10-CM

## 2018-04-13 DIAGNOSIS — R0609 Other forms of dyspnea: Secondary | ICD-10-CM

## 2018-04-13 DIAGNOSIS — I2510 Atherosclerotic heart disease of native coronary artery without angina pectoris: Secondary | ICD-10-CM

## 2018-04-13 DIAGNOSIS — E782 Mixed hyperlipidemia: Secondary | ICD-10-CM | POA: Diagnosis not present

## 2018-04-13 DIAGNOSIS — I10 Essential (primary) hypertension: Secondary | ICD-10-CM

## 2018-04-13 DIAGNOSIS — R072 Precordial pain: Secondary | ICD-10-CM

## 2018-04-14 LAB — BASIC METABOLIC PANEL
BUN/Creatinine Ratio: 15 (ref 12–28)
BUN: 9 mg/dL (ref 8–27)
CO2: 29 mmol/L (ref 20–29)
Calcium: 9.7 mg/dL (ref 8.7–10.3)
Chloride: 101 mmol/L (ref 96–106)
Creatinine, Ser: 0.61 mg/dL (ref 0.57–1.00)
GFR calc Af Amer: 108 mL/min/{1.73_m2} (ref >59)
GFR calc non Af Amer: 93 mL/min/{1.73_m2} (ref >59)
Glucose: 82 mg/dL (ref 65–99)
Potassium: 4.9 mmol/L (ref 3.5–5.2)
Sodium: 144 mmol/L (ref 134–144)

## 2018-04-15 DIAGNOSIS — J45901 Unspecified asthma with (acute) exacerbation: Secondary | ICD-10-CM | POA: Diagnosis not present

## 2018-04-16 ENCOUNTER — Telehealth (HOSPITAL_COMMUNITY): Payer: Self-pay | Admitting: Emergency Medicine

## 2018-04-16 NOTE — Telephone Encounter (Signed)
Reaching out to patient to offer assistance regarding upcoming cardiac imaging study; pt verbalizes understanding of appt date/time, parking situation and where to check in, pre-test NPO status and medications ordered, and verified current allergies; name and call back number provided for further questions should they arise Lakiesha Ralphs RN Navigator Cardiac Imaging [phone] 

## 2018-04-17 ENCOUNTER — Other Ambulatory Visit: Payer: Self-pay | Admitting: Internal Medicine

## 2018-04-19 ENCOUNTER — Ambulatory Visit (HOSPITAL_COMMUNITY)
Admission: RE | Admit: 2018-04-19 | Discharge: 2018-04-19 | Disposition: A | Discharge status: Home / Self Care | Payer: Medicare Other | Source: Ambulatory Visit | Attending: Cardiology | Admitting: Cardiology

## 2018-04-19 ENCOUNTER — Ambulatory Visit (HOSPITAL_COMMUNITY): Admission: RE | Admit: 2018-04-19 | Payer: Medicare Other | Source: Ambulatory Visit

## 2018-04-19 DIAGNOSIS — R072 Precordial pain: Secondary | ICD-10-CM | POA: Insufficient documentation

## 2018-04-19 DIAGNOSIS — I2510 Atherosclerotic heart disease of native coronary artery without angina pectoris: Secondary | ICD-10-CM | POA: Insufficient documentation

## 2018-04-19 DIAGNOSIS — R0609 Other forms of dyspnea: Secondary | ICD-10-CM | POA: Insufficient documentation

## 2018-04-19 DIAGNOSIS — I10 Essential (primary) hypertension: Secondary | ICD-10-CM

## 2018-04-19 DIAGNOSIS — E782 Mixed hyperlipidemia: Secondary | ICD-10-CM | POA: Insufficient documentation

## 2018-04-19 DIAGNOSIS — R5383 Other fatigue: Secondary | ICD-10-CM | POA: Insufficient documentation

## 2018-04-19 DIAGNOSIS — R0600 Dyspnea, unspecified: Secondary | ICD-10-CM

## 2018-04-19 NOTE — Progress Notes (Signed)
!  8 ga PIV attempt for CT heart unsuccessful.  Left arm assessed with ultrasound find no suitable veins.  Attempt right arm x 1 unsuccessful.  No other suitable veins found.

## 2018-05-15 ENCOUNTER — Ambulatory Visit: Payer: Self-pay

## 2018-05-15 NOTE — Telephone Encounter (Signed)
Pt called to say she has had a flare of her Asthma.  She has been using her nebulizer without real relief.  She states she is good for about 2 hours and then needs to treat again. Pt states she takes Brio but had been skipping doses to make it last.  She states that it is very expensive. She is speaking in full sentences to me.  She rates her attack as moderate with SOB at rest.  She has had some sinus drainage but she does not consider it her trigger.  She states that sinus drainage is common for her. Per protocol pt was advised to seek attention at the ER. Pt refuses and says that she has dealt with asthma all her life and does not consider her attack that severe. Appointment schedule per patient request with her PCP. Care advice read to patient. Pt states that if things get worse she will go to the ER. Pt verbalized understanding of all instructions.  Reason for Disposition . [1] MODERATE asthma attack (e.g., SOB at rest, speaks in phrases, audible wheezes) AND [2] not resolved after 2 nebulizer or inhaler treatments given 20 minutes apart  Answer Assessment - Initial Assessment Questions 1. RESPIRATORY STATUS: "Describe your breathing?" (e.g., wheezing, shortness of breath, unable to speak, severe coughing)      Wheezing SOB  2. ONSET: "When did this asthma attack begin?"      Last week 3. TRIGGER: "What do you think triggered this attack?" (e.g., URI, exposure to pollen or other allergen, tobacco smoke)      Sinus drainage 4. PEAK EXPIRATORY FLOW RATE (PEFR): "Do you use a peak flow meter?" If so, ask: "What's the current peak flow? What's your personal best peak flow?"      no 5. SEVERITY: "How bad is this attack?"    - MILD: No SOB at rest, mild SOB with walking, speaks normally in sentences, can lay down, no retractions, pulse < 100. (GREEN Zone: PEFR 80-100%)   - MODERATE: SOB at rest, SOB with minimal exertion and prefers to sit, cannot lie down flat, speaks in phrases, mild retractions,  audible wheezing, pulse 100-120. (YELLOW Zone: PEFR 50-80%)    - SEVERE: Very SOB at rest, speaks in single words, struggling to breathe, sitting hunched forward, retractions, usually loud wheezing, sometimes minimal wheezing because of decreased air movement, pulse > 120. (RED Zone: PEFR < 50%).      Moderate 6. MEDICATIONS (Inhaler or nebs): "What are your asthma medications?" and "What treatments have you given so far?"    - Quick-relief: albuterol, metaproterenol, salbutamol, or other inhaled or nebulized beta-agonist medicines   - Long-term-control: steroids, cromolyn, or other anti-inflammatory medicines.     Nebulizer  Brio 7. OTHER SYMPTOMS: "Do you have any other symptoms? (e.g., runny nose, chest pain, fever)     no 8. PREGNANCY: "Is there any chance you are pregnant?" "When was your last menstrual period?"  N/A  Protocols used: ASTHMA ATTACK-A-AH

## 2018-05-15 NOTE — Progress Notes (Signed)
Subjective:    Patient ID: [name], female    DOB: October 25, 1950, 68 y.o.   MRN: [id number]  HPI The patient is here for an acute visit.   Asthma flare:  Her asthma flared up last week.  She was SOB last week.  Over the weekend she started wheezing and had to use the nebulizer every 3-4 hours.  There were obvious triggers - she has sinus drainage but no more than usual.  She denies cold symptoms.  She does not feel like she is sick.  She has noticed a slight cough, but does not think it is anything.  She was skipping some days of her Memory Dance to make it last.  Past couple of days she did take it and it did help.  Is to difficult for her to afford the medication because it is so expensive.  Medications and allergies reviewed with patient and updated if appropriate.  Patient Active Problem List   Diagnosis Date Noted  . Murmur, cardiac 01/16/2018  . Moderate persistent asthma with exacerbation 12/13/2017  . Osteoporosis 01/07/2017  . Acute bronchitis 10/17/2016  . Prediabetes 07/07/2016  . History of breast cancer 01/06/2016  . Intraductal papilloma of left breast 01/06/2016  . Essential hypertension 06/08/2015  . DOE (dyspnea on exertion) 06/08/2015  . Fatigue 05/20/2015  . Back pain 04/22/2015  . Anemia due to blood loss 11/27/2013  . Elevated CPK   . Multinodular goiter 12/01/2011  . Asthma   . Hypothyroidism   . CAD (coronary artery disease)   . Atrial septal aneurysm   . Hyperlipidemia   . Scoliosis   . Spinal stenosis, lumbar region, with neurogenic claudication 07/01/2011  . Lumbar postlaminectomy syndrome 07/01/2011  . Osteoarthritis of right hip 07/01/2011  . Contracture of right hip 07/01/2011  . Endometriosis   . Depression 12/24/2009  . Migraine headache 05/19/2006    Current Outpatient Medications on File Prior to Visit  Medication Sig Dispense Refill  . albuterol (PROVENTIL HFA;VENTOLIN HFA) 108 (90 Base) MCG/ACT inhaler Inhale 2 puffs into the lungs every  4 (four) hours as needed for wheezing or shortness of breath. 1 Inhaler 0  . atorvastatin (LIPITOR) 20 MG tablet Take 1 tablet (20 mg total) by mouth daily. 90 tablet 3  . benazepril (LOTENSIN) 40 MG tablet Take 1 tablet (40 mg total) by mouth daily. 90 tablet 3  . DULoxetine (CYMBALTA) 60 MG capsule TAKE ONE CAPSULE EVERY DAY 90 capsule 1  . gabapentin (NEURONTIN) 600 MG tablet Take 1 tablet (600 mg total) by mouth at bedtime. 30 tablet 3  . HYDROcodone-acetaminophen (NORCO) 10-325 MG tablet Take 1 tablet by mouth every 8 (eight) hours as needed. 100 tablet 0  . ipratropium-albuterol (DUONEB) 0.5-2.5 (3) MG/3ML SOLN Take 3 mLs by nebulization every 4 (four) hours as needed (wheezing or SOB). During exacerbation 360 mL 1  . levothyroxine (SYNTHROID, LEVOTHROID) 75 MCG tablet TAKE ONE TABLET BY MOUTH DAILY 90 tablet 1  . metoprolol tartrate (LOPRESSOR) 50 MG tablet Take 1.5 tablets (75 mg total) by mouth 2 (two) times daily. 270 tablet 3   No current facility-administered medications on file prior to visit.     Past Medical History:  Diagnosis Date  . Anemia    hx  . Arthritis   . Asthma    PFTs, February, 2011, moderate obstructive disease with response to bronchodilators, normal lung volumes, moderate reduction in diffusing capacity  . Atrial septal aneurysm    Echo, 2008-not noted  on 13 echo  . CAD (coronary artery disease)    90% distal LAD in the past  /   nuclear, 2008, no ischemia, ejection fraction 70%  . Cancer ([place]) 2002   DUCTAL CIS--S/P LUMPECTOMY, RADIATION AND 6 WEEKS OF TAMOXIFEN  . D-dimer, elevated    January, 2014  . Depression   . Ejection fraction    EF 60%, echo, October, 2008  . Elevated CPK    January, 2014  . Endometriosis 1989   RIGHT TUBE  . Endometriosis 1987   LEFT TUBE/OVARY W FOCAL IN-SITU ENDOMETRIAL ADENOCARCINOMA  . GERD (gastroesophageal reflux disease)    occ  . H/O hiatal hernia    ?  [name] Hyperlipidemia   . Hypertension   . Hypothyroidism      Patient has had in the past that she does not need treatment  . Kyphoscoliosis   . Obstructive airway disease ([place])   . Pinched nerve    lower back  . Shortness of breath   . UTI (lower urinary tract infection)     Past Surgical History:  Procedure Laterality Date  . ANKLE SURGERY Left    ligament  . APPENDECTOMY  1987   AT [facility]  . BACK SURGERY     Fusion  . BREAST LUMPECTOMY  2003   radiation on right  . BREAST LUMPECTOMY WITH RADIOACTIVE SEED LOCALIZATION Left 02/12/2016   Procedure: LEFT BREAST LUMPECTOMY WITH RADIOACTIVE SEED LOCALIZATION;  Surgeon: Excell Seltzer, MD;  Location: [facility];  Service: General;  Laterality: Left;  . CARDIOVASCULAR STRESS TEST  09/07/05   Nuclear, was negative  . CATARACT EXTRACTION Bilateral 01,03  . OOPHORECTOMY     LSO -RSO  . PELVIC LAPAROSCOPY  1989   RSO, LYSIS OF ADHESIONS  . TOTAL ABDOMINAL HYSTERECTOMY  1987   LSO, APPENDECTOMY  . TOTAL HIP ARTHROPLASTY Right 10/28/2013   dr Lorin Mercy  . TOTAL HIP ARTHROPLASTY Right 10/28/2013   Procedure: TOTAL HIP ARTHROPLASTY ANTERIOR APPROACH;  Surgeon: Marybelle Killings, MD;  Location: [facility];  Service: Orthopedics;  Laterality: Right;  Right Total Hip Arthroplasty, Direct Anterior Approach    Social History   Socioeconomic History  . Marital status: Widowed    Spouse name: Not on file  . Number of children: Not on file  . Years of education: Not on file  . Highest education level: Not on file  Occupational History  . Not on file  Social Needs  . Financial resource strain: Not on file  . Food insecurity:    Worry: Not on file    Inability: Not on file  . Transportation needs:    Medical: Not on file    Non-medical: Not on file  Tobacco Use  . Smoking status: Never Smoker  . Smokeless tobacco: Never Used  Substance and Sexual Activity  . Alcohol use: No    Alcohol/week: 0.0 standard drinks  . Drug use: No  . Sexual activity: Never    Birth control/protection: Surgical   Lifestyle  . Physical activity:    Days per week: Not on file    Minutes per session: Not on file  . Stress: Not on file  Relationships  . Social connections:    Talks on phone: Not on file    Gets together: Not on file    Attends religious service: Not on file    Active member of club or organization: Not on file    Attends meetings of clubs or organizations: Not  on file    Relationship status: Not on file  Other Topics Concern  . Not on file  Social History Narrative  . Not on file    Family History  Problem Relation Age of Onset  . Heart failure Mother   . Pneumonia Mother   . Hypertension Mother   . Heart disease Mother   . Stroke Maternal Grandfather   . Hypertension Maternal Grandfather   . Heart attack Father   . Heart disease Father   . Asthma Paternal Uncle        PAT UNCLES  . Heart attack Paternal Uncle     Review of Systems  Constitutional: Negative for chills and fever.  HENT: Positive for postnasal drip. Negative for congestion, ear pain, sinus pain and sore throat.   Respiratory: Positive for cough (occasional), chest tightness, shortness of breath and wheezing.   Cardiovascular: Negative for chest pain.  Gastrointestinal: Negative for nausea.       No gerd  Neurological: Negative for light-headedness and headaches.       Objective:   Vitals:   05/16/18 1319  BP: (!) 172/96  Pulse: 73  Resp: 18  Temp: 98.1 F (36.7 C)  SpO2: 94%   BP Readings from Last 3 Encounters:  05/16/18 (!) 172/96  05/16/18 133/83  04/19/18 (!) 168/85   Wt Readings from Last 3 Encounters:  05/16/18 168 lb 1.9 oz (76.3 kg)  05/16/18 168 lb (76.2 kg)  03/16/18 160 lb (72.6 kg)   Body mass index is 32.83 kg/m.   Physical Exam    GENERAL APPEARANCE: Appears stated age, well appearing, NAD EYES: conjunctiva clear, no icterus HEENT: bilateral tympanic membranes and ear canals normal, oropharynx with no erythema, no thyromegaly, trachea midline, no cervical or  supraclavicular lymphadenopathy LUNGS: unlabored breathing, good air entry bilaterally, occ mild wheeze, mild dry crackles at R base CARDIOVASCULAR: Normal S1,S2 without murmurs, no edema SKIN: Warm, dry     Assessment & Plan:    See Problem List for Assessment and Plan of chronic medical problems.

## 2018-05-16 ENCOUNTER — Encounter: Payer: Self-pay | Admitting: Internal Medicine

## 2018-05-16 ENCOUNTER — Encounter: Payer: Medicare Other | Attending: Physical Medicine & Rehabilitation | Admitting: Registered Nurse

## 2018-05-16 ENCOUNTER — Ambulatory Visit (INDEPENDENT_AMBULATORY_CARE_PROVIDER_SITE_OTHER): Payer: Medicare Other | Admitting: Internal Medicine

## 2018-05-16 ENCOUNTER — Encounter: Payer: Self-pay | Admitting: Registered Nurse

## 2018-05-16 VITALS — BP 133/83 | HR 75 | Ht 60.0 in | Wt 168.0 lb

## 2018-05-16 VITALS — BP 172/96 | HR 73 | Temp 98.1°F | Resp 18 | Ht 60.0 in | Wt 168.1 lb

## 2018-05-16 DIAGNOSIS — J4541 Moderate persistent asthma with (acute) exacerbation: Secondary | ICD-10-CM

## 2018-05-16 DIAGNOSIS — M48061 Spinal stenosis, lumbar region without neurogenic claudication: Secondary | ICD-10-CM | POA: Insufficient documentation

## 2018-05-16 DIAGNOSIS — M48062 Spinal stenosis, lumbar region with neurogenic claudication: Secondary | ICD-10-CM

## 2018-05-16 DIAGNOSIS — Z5181 Encounter for therapeutic drug level monitoring: Secondary | ICD-10-CM | POA: Diagnosis not present

## 2018-05-16 DIAGNOSIS — G894 Chronic pain syndrome: Secondary | ICD-10-CM | POA: Diagnosis not present

## 2018-05-16 DIAGNOSIS — M4126 Other idiopathic scoliosis, lumbar region: Secondary | ICD-10-CM | POA: Insufficient documentation

## 2018-05-16 DIAGNOSIS — M1611 Unilateral primary osteoarthritis, right hip: Secondary | ICD-10-CM | POA: Insufficient documentation

## 2018-05-16 DIAGNOSIS — M24551 Contracture, right hip: Secondary | ICD-10-CM

## 2018-05-16 DIAGNOSIS — M961 Postlaminectomy syndrome, not elsewhere classified: Secondary | ICD-10-CM | POA: Diagnosis not present

## 2018-05-16 DIAGNOSIS — M419 Scoliosis, unspecified: Secondary | ICD-10-CM

## 2018-05-16 DIAGNOSIS — Z79891 Long term (current) use of opiate analgesic: Secondary | ICD-10-CM | POA: Diagnosis not present

## 2018-05-16 MED ORDER — METHYLPREDNISOLONE ACETATE 80 MG/ML IJ SUSP
80.0000 mg | Freq: Once | INTRAMUSCULAR | Status: AC
  Administered 2018-05-16: 80 mg via INTRAMUSCULAR

## 2018-05-16 MED ORDER — FLUTICASONE FUROATE-VILANTEROL 100-25 MCG/INH IN AEPB: 1.0000 | INHALATION_SPRAY | Freq: Every day | RESPIRATORY_TRACT | 11 refills | Status: DC

## 2018-05-16 MED ORDER — PREDNISONE 10 MG PO TABS: ORAL_TABLET | ORAL | 0 refills | Status: DC

## 2018-05-16 MED ORDER — HYDROCODONE-ACETAMINOPHEN 10-325 MG PO TABS: 1.0000 | ORAL_TABLET | Freq: Three times a day (TID) | ORAL | 0 refills | Status: DC | PRN

## 2018-05-16 NOTE — Assessment & Plan Note (Signed)
Symptoms consistent with asthma exacerbation No obvious URI so we will hold off on antibiotics She has not been using the Breo daily and she will try to use it on a daily basis-sample given.  Prescription sent to pharmacy Continue nebulizer treatments at home Depo-Medrol 80 mg IM x1 Prescription for oral steroids given-she will only use this if her symptoms worsen over the next few days Her symptoms develop more into a bronchitis or pneumonia she will call and I will send an antibiotic

## 2018-05-16 NOTE — Telephone Encounter (Signed)
Sees you at 1:15

## 2018-05-16 NOTE — Patient Instructions (Addendum)
  You received a steroid injection today.    Continue the [place] daily.  Use the neb treatments.  Take the prednisone if needed.  Call if no improvement and we will consider an antibiotic.

## 2018-05-16 NOTE — Progress Notes (Signed)
Subjective:    Patient ID: Hannah Scott, female    DOB: 1951-02-14, 68 y.o.   MRN: [id number]  HPI: Hannah Scott is a 68 y.o. female who returns for follow up appointment for chronic pain and medication refill. She states her  pain is located in her lower back. She rates her pain 6. Her current exercise regime is walking.  Hannah Scott Morphine equivalent is 33.33 MME.  Last UDS was Performed 0n 10/12/2017, it was consistent. Oral Swab ordered  Today.  Pain Inventory Average Pain 7 Pain Right Now 6 My pain is constant, burning, tingling and aching  In the last 24 hours, has pain interfered with the following? General activity 10 Relation with others 10 Enjoyment of life 10 What TIME of day is your pain at its worst? all Sleep (in general) Poor  Pain is worse with: walking and standing Pain improves with: rest and medication Relief from Meds: 6  Mobility walk with assistance use a cane use a walker ability to climb steps?  yes do you drive?  yes  Function disabled: date disabled na I need assistance with the following:  household duties  Neuro/Psych weakness numbness tingling trouble walking spasms depression  Prior Studies Any changes since last visit?  no  Physicians involved in your care Any changes since last visit?  no   Family History  Problem Relation Age of Onset  . Heart failure Mother   . Pneumonia Mother   . Hypertension Mother   . Heart disease Mother   . Stroke Maternal Grandfather   . Hypertension Maternal Grandfather   . Heart attack Father   . Heart disease Father   . Asthma Paternal Uncle        PAT UNCLES  . Heart attack Paternal Uncle    Social History   Socioeconomic History  . Marital status: Widowed    Spouse name: Not on file  . Number of children: Not on file  . Years of education: Not on file  . Highest education level: Not on file  Occupational History  . Not on file  Social Needs  . Financial resource strain: Not on file    . Food insecurity:    Worry: Not on file    Inability: Not on file  . Transportation needs:    Medical: Not on file    Non-medical: Not on file  Tobacco Use  . Smoking status: Never Smoker  . Smokeless tobacco: Never Used  Substance and Sexual Activity  . Alcohol use: No    Alcohol/week: 0.0 standard drinks  . Drug use: No  . Sexual activity: Never    Birth control/protection: Surgical  Lifestyle  . Physical activity:    Days per week: Not on file    Minutes per session: Not on file  . Stress: Not on file  Relationships  . Social connections:    Talks on phone: Not on file    Gets together: Not on file    Attends religious service: Not on file    Active member of club or organization: Not on file    Attends meetings of clubs or organizations: Not on file    Relationship status: Not on file  Other Topics Concern  . Not on file  Social History Narrative  . Not on file   Past Surgical History:  Procedure Laterality Date  . ANKLE SURGERY Left    ligament  . APPENDECTOMY  1987   AT [facility]  .  BACK SURGERY     Fusion  . BREAST LUMPECTOMY  2003   radiation on right  . BREAST LUMPECTOMY WITH RADIOACTIVE SEED LOCALIZATION Left 02/12/2016   Procedure: LEFT BREAST LUMPECTOMY WITH RADIOACTIVE SEED LOCALIZATION;  Surgeon: Excell Seltzer, MD;  Location: [facility];  Service: General;  Laterality: Left;  . CARDIOVASCULAR STRESS TEST  09/07/05   Nuclear, was negative  . CATARACT EXTRACTION Bilateral 01,03  . OOPHORECTOMY     LSO -RSO  . PELVIC LAPAROSCOPY  1989   RSO, LYSIS OF ADHESIONS  . TOTAL ABDOMINAL HYSTERECTOMY  1987   LSO, APPENDECTOMY  . TOTAL HIP ARTHROPLASTY Right 10/28/2013   dr Lorin Mercy  . TOTAL HIP ARTHROPLASTY Right 10/28/2013   Procedure: TOTAL HIP ARTHROPLASTY ANTERIOR APPROACH;  Surgeon: Marybelle Killings, MD;  Location: [facility];  Service: Orthopedics;  Laterality: Right;  Right Total Hip Arthroplasty, Direct Anterior Approach   Past Medical History:   Diagnosis Date  . Anemia    hx  . Arthritis   . Asthma    PFTs, February, 2011, moderate obstructive disease with response to bronchodilators, normal lung volumes, moderate reduction in diffusing capacity  . Atrial septal aneurysm    Echo, 2008-not noted on 13 echo  . CAD (coronary artery disease)    90% distal LAD in the past  /   nuclear, 2008, no ischemia, ejection fraction 70%  . Cancer ([place]) 2002   DUCTAL CIS--S/P LUMPECTOMY, RADIATION AND 6 WEEKS OF TAMOXIFEN  . D-dimer, elevated    January, 2014  . Depression   . Ejection fraction    EF 60%, echo, October, 2008  . Elevated CPK    January, 2014  . Endometriosis 1989   RIGHT TUBE  . Endometriosis 1987   LEFT TUBE/OVARY W FOCAL IN-SITU ENDOMETRIAL ADENOCARCINOMA  . GERD (gastroesophageal reflux disease)    occ  . H/O hiatal hernia    ?  [name] Hyperlipidemia   . Hypertension   . Hypothyroidism    Patient has had in the past that she does not need treatment  . Kyphoscoliosis   . Obstructive airway disease ([place])   . Pinched nerve    lower back  . Shortness of breath   . UTI (lower urinary tract infection)    BP 133/83   Pulse 75   Ht 5' (1.524 m)   Wt 168 lb (76.2 kg)   SpO2 91%   BMI 32.81 kg/m   Opioid Risk Score:   Fall Risk Score:  `1  Depression screen PHQ 2/9  Depression screen [facility] 2/9 05/16/2018 01/17/2018 06/02/2017 05/03/2017 01/27/2017 01/10/2017 01/02/2017  Decreased Interest 1 1 1 1 1  0 0  Down, Depressed, Hopeless 1 1 1 1 1 2 1   PHQ - 2 Score 2 2 2 2 2 2 1   Altered sleeping - - - - - 1 -  Tired, decreased energy - - - - - 1 -  Change in appetite - - - - - 0 -  Feeling bad or failure about yourself  - - - - - 1 -  Trouble concentrating - - - - - 0 -  Moving slowly or fidgety/restless - - - - - 0 -  Suicidal thoughts - - - - - 0 -  PHQ-9 Score - - - - - 5 -  Difficult doing work/chores - - - - - Not difficult at all -  Some recent data might be hidden    Review of Systems  Constitutional: Negative.  HENT: Negative.   Eyes: Negative.   Respiratory: Negative.   Cardiovascular: Negative.   Gastrointestinal: Negative.   Endocrine: Negative.   Genitourinary: Negative.   Musculoskeletal: Negative.   Skin: Negative.   Allergic/Immunologic: Negative.   Neurological: Positive for weakness and numbness.       Tingling  Hematological: Negative.   Psychiatric/Behavioral: The patient is nervous/anxious.   All other systems reviewed and are negative.      Objective:   Physical Exam Vitals signs and nursing note reviewed.  Constitutional:      Appearance: Normal appearance.  Neck:     Musculoskeletal: Normal range of motion and neck supple.  Cardiovascular:     Rate and Rhythm: Normal rate and regular rhythm.     Pulses: Normal pulses.     Heart sounds: Normal heart sounds.  Pulmonary:     Effort: Pulmonary effort is normal.     Breath sounds: Normal breath sounds.  Musculoskeletal:     Comments: Normal Muscle Bulk and Muscle Testing Reveals:  Upper Extremities: Full ROM and Muscle Strength 5/5 , Lumbar Paraspinal Tenderness: L-3-L-5 Lower Extremities: Full ROM and Muscle Strength 5/5 Arises from Table slowly using walker for support Narrow Based Gait   Skin:    General: Skin is warm and dry.  Neurological:     Mental Status: She is alert and oriented to person, place, and time.  Psychiatric:        Mood and Affect: Mood normal.        Behavior: Behavior normal.           Assessment & Plan:  1 Lumbar post laminectomy syndrome with severe kyphoscoliosis thoracolumbar spine, s/p lumbar fusion/  05/16/2018 Continue current medication regimen.Refilled: Hydrocodone 10/325mg  one tablet every 8 hours may take an extra tablet when pain is severe #100. We will continue the opioid monitoring program, this consists of regular clinic visits, examinations, urine drug screen, pill counts as well as use of [place] Controlled Substance Reporting system. 2. Right Hip OA: S/P  Right Hip Replacement 10/28/2013. 05/16/2018 3.Depression: Continue Cymbalta. Has Family Support and Friends. Counseling with Doristine Bosworth. 05/16/2018 4.BilateralGreater Trochanteric Tenderness: No complaints Today.Continue to Monitor. Continue with Ice and Heat Therapy.05/16/2018  15 minutes of face to face patient care time was spent during this visit. All questions were encouraged and answered.  F/U in 1 month

## 2018-05-21 LAB — DRUG TOX MONITOR 1 W/CONF, ORAL FLD
Amphetamines: NEGATIVE ng/mL (ref <10)
Barbiturates: NEGATIVE ng/mL (ref <10)
Benzodiazepines: NEGATIVE ng/mL (ref <0.50)
Buprenorphine: NEGATIVE ng/mL (ref <0.10)
CODEINE: NEGATIVE ng/mL (ref <2.5)
Cocaine: NEGATIVE ng/mL (ref <5.0)
DIHYDROCODEINE: 5.8 ng/mL — AB (ref <2.5)
Fentanyl: NEGATIVE ng/mL (ref <0.10)
Heroin Metabolite: NEGATIVE ng/mL (ref <1.0)
Hydrocodone: 48.5 ng/mL — ABNORMAL HIGH (ref <2.5)
Hydromorphone: NEGATIVE ng/mL (ref <2.5)
MARIJUANA: NEGATIVE ng/mL (ref <2.5)
MDMA: NEGATIVE ng/mL (ref <10)
Meprobamate: NEGATIVE ng/mL (ref <2.5)
Methadone: NEGATIVE ng/mL (ref <5.0)
Morphine: NEGATIVE ng/mL (ref <2.5)
Nicotine Metabolite: NEGATIVE ng/mL (ref <5.0)
Norhydrocodone: NEGATIVE ng/mL (ref <2.5)
Noroxycodone: NEGATIVE ng/mL (ref <2.5)
Opiates: POSITIVE ng/mL — AB (ref <2.5)
Oxycodone: NEGATIVE ng/mL (ref <2.5)
Oxymorphone: NEGATIVE ng/mL (ref <2.5)
Phencyclidine: NEGATIVE ng/mL (ref <10)
TAPENTADOL: NEGATIVE ng/mL (ref <5.0)
Zolpidem: NEGATIVE ng/mL (ref <5.0)

## 2018-05-21 LAB — DRUG TOX ALC METAB W/CON, ORAL FLD: Alcohol Metabolite: NEGATIVE ng/mL (ref <25)

## 2018-05-22 ENCOUNTER — Telehealth: Payer: Self-pay | Admitting: *Deleted

## 2018-05-22 NOTE — Telephone Encounter (Signed)
Oral swab drug screen was consistent for prescribed medications.  ?

## 2018-06-14 ENCOUNTER — Other Ambulatory Visit: Payer: Self-pay | Admitting: Cardiology

## 2018-06-17 ENCOUNTER — Other Ambulatory Visit: Payer: Self-pay | Admitting: Internal Medicine

## 2018-06-18 ENCOUNTER — Encounter: Payer: Self-pay | Admitting: Registered Nurse

## 2018-06-18 ENCOUNTER — Encounter: Payer: Medicare Other | Attending: Physical Medicine & Rehabilitation | Admitting: Registered Nurse

## 2018-06-18 VITALS — BP 122/84 | HR 69 | Ht 60.0 in | Wt 162.0 lb

## 2018-06-18 DIAGNOSIS — M961 Postlaminectomy syndrome, not elsewhere classified: Secondary | ICD-10-CM | POA: Diagnosis not present

## 2018-06-18 DIAGNOSIS — M24551 Contracture, right hip: Secondary | ICD-10-CM | POA: Diagnosis not present

## 2018-06-18 DIAGNOSIS — M1611 Unilateral primary osteoarthritis, right hip: Secondary | ICD-10-CM | POA: Insufficient documentation

## 2018-06-18 DIAGNOSIS — G894 Chronic pain syndrome: Secondary | ICD-10-CM

## 2018-06-18 DIAGNOSIS — M4126 Other idiopathic scoliosis, lumbar region: Secondary | ICD-10-CM | POA: Diagnosis not present

## 2018-06-18 DIAGNOSIS — M48062 Spinal stenosis, lumbar region with neurogenic claudication: Secondary | ICD-10-CM

## 2018-06-18 DIAGNOSIS — Z79891 Long term (current) use of opiate analgesic: Secondary | ICD-10-CM

## 2018-06-18 DIAGNOSIS — M48061 Spinal stenosis, lumbar region without neurogenic claudication: Secondary | ICD-10-CM | POA: Insufficient documentation

## 2018-06-18 DIAGNOSIS — Z5181 Encounter for therapeutic drug level monitoring: Secondary | ICD-10-CM

## 2018-06-18 MED ORDER — HYDROCODONE-ACETAMINOPHEN 10-325 MG PO TABS: 1.0000 | ORAL_TABLET | Freq: Three times a day (TID) | ORAL | 0 refills | Status: DC | PRN

## 2018-06-18 NOTE — Progress Notes (Signed)
Subjective:    Patient ID: Hannah Scott, female    DOB: 23-Oct-1950, 68 y.o.   MRN: [id number]  HPI: Hannah Scott is a 68 y.o. female who returns for follow up appointment for chronic pain and medication refill. She states her  pain is located in her lower back. She rates her pain 7. Her  current exercise regime is walking.  Ms. Douse Morphine equivalent is 33.33 MME.  Last Oral Swab was Performed on 05/16/2018, it was consistent.   Pain Inventory Average Pain 8 Pain Right Now 7 My pain is constant, burning, tingling and aching  In the last 24 hours, has pain interfered with the following? General activity 10 Relation with others 10 Enjoyment of life 10 What TIME of day is your pain at its worst? all Sleep (in general) Poor  Pain is worse with: walking and standing Pain improves with: rest and medication Relief from Meds: 4  Mobility use a cane ability to climb steps?  yes do you drive?  yes  Function disabled: date disabled .  Neuro/Psych weakness numbness tingling trouble walking depression  Prior Studies Any changes since last visit?  no  Physicians involved in your care Any changes since last visit?  no   Family History  Problem Relation Age of Onset  . Heart failure Mother   . Pneumonia Mother   . Hypertension Mother   . Heart disease Mother   . Stroke Maternal Grandfather   . Hypertension Maternal Grandfather   . Heart attack Father   . Heart disease Father   . Asthma Paternal Uncle        PAT UNCLES  . Heart attack Paternal Uncle    Social History   Socioeconomic History  . Marital status: Widowed    Spouse name: Not on file  . Number of children: Not on file  . Years of education: Not on file  . Highest education level: Not on file  Occupational History  . Not on file  Social Needs  . Financial resource strain: Not on file  . Food insecurity:    Worry: Not on file    Inability: Not on file  . Transportation needs:    Medical: Not on  file    Non-medical: Not on file  Tobacco Use  . Smoking status: Never Smoker  . Smokeless tobacco: Never Used  Substance and Sexual Activity  . Alcohol use: No    Alcohol/week: 0.0 standard drinks  . Drug use: No  . Sexual activity: Never    Birth control/protection: Surgical  Lifestyle  . Physical activity:    Days per week: Not on file    Minutes per session: Not on file  . Stress: Not on file  Relationships  . Social connections:    Talks on phone: Not on file    Gets together: Not on file    Attends religious service: Not on file    Active member of club or organization: Not on file    Attends meetings of clubs or organizations: Not on file    Relationship status: Not on file  Other Topics Concern  . Not on file  Social History Narrative  . Not on file   Past Surgical History:  Procedure Laterality Date  . ANKLE SURGERY Left    ligament  . APPENDECTOMY  1987   AT [facility]  . BACK SURGERY     Fusion  . BREAST LUMPECTOMY  2003   radiation on right  .  BREAST LUMPECTOMY WITH RADIOACTIVE SEED LOCALIZATION Left 02/12/2016   Procedure: LEFT BREAST LUMPECTOMY WITH RADIOACTIVE SEED LOCALIZATION;  Surgeon: Excell Seltzer, MD;  Location: [facility];  Service: General;  Laterality: Left;  . CARDIOVASCULAR STRESS TEST  09/07/05   Nuclear, was negative  . CATARACT EXTRACTION Bilateral 01,03  . OOPHORECTOMY     LSO -RSO  . PELVIC LAPAROSCOPY  1989   RSO, LYSIS OF ADHESIONS  . TOTAL ABDOMINAL HYSTERECTOMY  1987   LSO, APPENDECTOMY  . TOTAL HIP ARTHROPLASTY Right 10/28/2013   dr Lorin Mercy  . TOTAL HIP ARTHROPLASTY Right 10/28/2013   Procedure: TOTAL HIP ARTHROPLASTY ANTERIOR APPROACH;  Surgeon: Marybelle Killings, MD;  Location: [facility];  Service: Orthopedics;  Laterality: Right;  Right Total Hip Arthroplasty, Direct Anterior Approach   Past Medical History:  Diagnosis Date  . Anemia    hx  . Arthritis   . Asthma    PFTs, February, 2011, moderate obstructive disease with  response to bronchodilators, normal lung volumes, moderate reduction in diffusing capacity  . Atrial septal aneurysm    Echo, 2008-not noted on 13 echo  . CAD (coronary artery disease)    90% distal LAD in the past  /   nuclear, 2008, no ischemia, ejection fraction 70%  . Cancer ([place]) 2002   DUCTAL CIS--S/P LUMPECTOMY, RADIATION AND 6 WEEKS OF TAMOXIFEN  . D-dimer, elevated    January, 2014  . Depression   . Ejection fraction    EF 60%, echo, October, 2008  . Elevated CPK    January, 2014  . Endometriosis 1989   RIGHT TUBE  . Endometriosis 1987   LEFT TUBE/OVARY W FOCAL IN-SITU ENDOMETRIAL ADENOCARCINOMA  . GERD (gastroesophageal reflux disease)    occ  . H/O hiatal hernia    ?  [name] Hyperlipidemia   . Hypertension   . Hypothyroidism    Patient has had in the past that she does not need treatment  . Kyphoscoliosis   . Obstructive airway disease ([place])   . Pinched nerve    lower back  . Shortness of breath   . UTI (lower urinary tract infection)    BP 122/84   Pulse 69   Ht 5' (1.524 m)   Wt 162 lb (73.5 kg)   SpO2 94%   BMI 31.64 kg/m   Opioid Risk Score:   Fall Risk Score:  `1  Depression screen PHQ 2/9  Depression screen [facility] 2/9 05/16/2018 01/17/2018 06/02/2017 05/03/2017 01/27/2017 01/10/2017 01/02/2017  Decreased Interest 1 1 1 1 1  0 0  Down, Depressed, Hopeless 1 1 1 1 1 2 1   PHQ - 2 Score 2 2 2 2 2 2 1   Altered sleeping - - - - - 1 -  Tired, decreased energy - - - - - 1 -  Change in appetite - - - - - 0 -  Feeling bad or failure about yourself  - - - - - 1 -  Trouble concentrating - - - - - 0 -  Moving slowly or fidgety/restless - - - - - 0 -  Suicidal thoughts - - - - - 0 -  PHQ-9 Score - - - - - 5 -  Difficult doing work/chores - - - - - Not difficult at all -  Some recent data might be hidden     Review of Systems  Constitutional: Negative.   HENT: Negative.   Eyes: Negative.   Respiratory: Negative.   Cardiovascular: Negative.   Gastrointestinal:  Negative.   Endocrine: Negative.   Genitourinary: Negative.   Musculoskeletal: Positive for arthralgias, gait problem and myalgias.  Skin: Negative.   Allergic/Immunologic: Negative.   Neurological: Positive for weakness and numbness.  Hematological: Negative.   Psychiatric/Behavioral: Positive for dysphoric mood.  All other systems reviewed and are negative.      Objective:   Physical Exam Vitals signs and nursing note reviewed.  Constitutional:      Appearance: Normal appearance.  Neck:     Musculoskeletal: Normal range of motion and neck supple.  Cardiovascular:     Rate and Rhythm: Normal rate and regular rhythm.     Pulses: Normal pulses.     Heart sounds: Normal heart sounds.  Pulmonary:     Effort: Pulmonary effort is normal.     Breath sounds: Normal breath sounds.  Musculoskeletal:     Comments: Normal Muscle Bulk and Muscle Testing Reveals:  Upper Extremities: Full ROM and Muscle Strength 5/5  Lumbar Paraspinal Tenderness: L-4-L-5  Lower Extremities: Full ROM and Muscle Strength 5/5 Arises from Table Slowly using walker for support Antalgic Gait   Skin:    General: Skin is warm and dry.  Neurological:     Mental Status: She is alert and oriented to person, place, and time.  Psychiatric:        Mood and Affect: Mood normal.        Behavior: Behavior normal.           Assessment & Plan:  1 Lumbar post laminectomy syndrome with severe kyphoscoliosis thoracolumbar spine, s/p lumbar fusion/ 06/18/2018 Continue current medication regimen.Refilled: Hydrocodone 10/325mg  one tablet every 8 hours may take an extra tablet when pain is severe #100. We will continue the opioid monitoring program, this consists of regular clinic visits, examinations, urine drug screen, pill counts as well as use of [place] Controlled Substance Reporting system. 2. Right Hip OA: S/P Right Hip Replacement 10/28/2013.06/18/2018 3.Depression: Continue Cymbalta. Has Family Support  and Friends. Counseling with Doristine Bosworth. 06/18/2018 4.BilateralGreater Trochanteric Tenderness: No complaints Today.Continue to Monitor. Continue with Ice and Heat Therapy.06/18/2018  15 minutes of face to face patient care time was spent during this visit. All questions were encouraged and answered.  F/U in 1 month

## 2018-06-22 ENCOUNTER — Ambulatory Visit: Payer: Medicare Other | Admitting: Registered Nurse

## 2018-06-26 ENCOUNTER — Other Ambulatory Visit: Payer: Self-pay | Admitting: Cardiology

## 2018-07-01 ENCOUNTER — Other Ambulatory Visit: Payer: Self-pay | Admitting: Cardiology

## 2018-07-01 DIAGNOSIS — E782 Mixed hyperlipidemia: Secondary | ICD-10-CM

## 2018-07-01 DIAGNOSIS — R072 Precordial pain: Secondary | ICD-10-CM

## 2018-07-01 DIAGNOSIS — R5383 Other fatigue: Secondary | ICD-10-CM

## 2018-07-01 DIAGNOSIS — R0600 Dyspnea, unspecified: Secondary | ICD-10-CM

## 2018-07-01 DIAGNOSIS — I2510 Atherosclerotic heart disease of native coronary artery without angina pectoris: Secondary | ICD-10-CM

## 2018-07-01 DIAGNOSIS — I10 Essential (primary) hypertension: Secondary | ICD-10-CM

## 2018-07-01 DIAGNOSIS — R0609 Other forms of dyspnea: Secondary | ICD-10-CM

## 2018-07-11 ENCOUNTER — Telehealth: Payer: Self-pay | Admitting: Physical Medicine & Rehabilitation

## 2018-07-11 NOTE — Telephone Encounter (Signed)
Patient was originally scheduled for 3/19 with Zella Ball, but is fearful of the Corona Virus.  She would like her medications refilled and I have reschedule her for next month.

## 2018-07-12 ENCOUNTER — Encounter: Payer: Medicare Other | Admitting: Registered Nurse

## 2018-07-12 MED ORDER — HYDROCODONE-ACETAMINOPHEN 10-325 MG PO TABS: 1.0000 | ORAL_TABLET | Freq: Three times a day (TID) | ORAL | 0 refills | Status: DC | PRN

## 2018-07-12 NOTE — Telephone Encounter (Signed)
Hannah Scott is afraid to leave her home due to the [facility] Virus, she was rescheduled and Hydrocodone E-scribed. PMP was reviewed. Hannah Scott is aware of the above.

## 2018-07-17 ENCOUNTER — Other Ambulatory Visit: Payer: Self-pay | Admitting: Internal Medicine

## 2018-08-09 ENCOUNTER — Encounter: Payer: Medicare Other | Attending: Physical Medicine & Rehabilitation | Admitting: Registered Nurse

## 2018-08-09 ENCOUNTER — Encounter: Payer: Self-pay | Admitting: Registered Nurse

## 2018-08-09 ENCOUNTER — Other Ambulatory Visit: Payer: Self-pay

## 2018-08-09 VITALS — BP 142/74 | Temp 97.8°F | Ht 60.0 in | Wt 162.0 lb

## 2018-08-09 DIAGNOSIS — Z79891 Long term (current) use of opiate analgesic: Secondary | ICD-10-CM

## 2018-08-09 DIAGNOSIS — M419 Scoliosis, unspecified: Secondary | ICD-10-CM

## 2018-08-09 DIAGNOSIS — M961 Postlaminectomy syndrome, not elsewhere classified: Secondary | ICD-10-CM | POA: Diagnosis not present

## 2018-08-09 DIAGNOSIS — M24551 Contracture, right hip: Secondary | ICD-10-CM

## 2018-08-09 DIAGNOSIS — M48061 Spinal stenosis, lumbar region without neurogenic claudication: Secondary | ICD-10-CM | POA: Insufficient documentation

## 2018-08-09 DIAGNOSIS — G894 Chronic pain syndrome: Secondary | ICD-10-CM | POA: Diagnosis not present

## 2018-08-09 DIAGNOSIS — M1611 Unilateral primary osteoarthritis, right hip: Secondary | ICD-10-CM | POA: Insufficient documentation

## 2018-08-09 DIAGNOSIS — M4126 Other idiopathic scoliosis, lumbar region: Secondary | ICD-10-CM | POA: Insufficient documentation

## 2018-08-09 DIAGNOSIS — M48062 Spinal stenosis, lumbar region with neurogenic claudication: Secondary | ICD-10-CM

## 2018-08-09 DIAGNOSIS — Z5181 Encounter for therapeutic drug level monitoring: Secondary | ICD-10-CM

## 2018-08-09 MED ORDER — HYDROCODONE-ACETAMINOPHEN 10-325 MG PO TABS: 1.0000 | ORAL_TABLET | Freq: Three times a day (TID) | ORAL | 0 refills | Status: DC | PRN

## 2018-08-09 NOTE — Progress Notes (Signed)
Subjective:    Patient ID: Hannah Scott, female    DOB: [birth year], 69 y.o.   MRN: [id number]  HPI: Hannah Scott is a 68 y.o. female  Her appointment was changed,due to national recommendations of social distancing due to [place] 19, an audio/video telehealth visit is felt to be most appropriate for this patient at this time.  See Chart message from today for the patient's consent to telehealth from [place].   She   states her ain is located in her lower back. She rates her pain 5. Her  current exercise regime is walking.   Ms. Fournier Morphine equivalent is  33.33 MME. Last Oral swab was Performed on 05/16/2018, it was consistent.   Geryl Rankins CMA asked the Health and History Questions, This provider and Mancel Parsons  verified we were speaking with the correct person using two identifiers.  Pain Inventory Average Pain 7 Pain Right Now 5 My pain is constant, burning, tingling and aching  In the last 24 hours, has pain interfered with the following? General activity 10 Relation with others 10 Enjoyment of life 10 What TIME of day is your pain at its worst? all Sleep (in general) Poor  Pain is worse with: walking and standing Pain improves with: rest and medication Relief from Meds: 6  Mobility walk with assistance use a cane use a walker how many minutes can you walk? ? ability to climb steps?  yes do you drive?  yes  Function disabled: date disabled . retired  Neuro/Psych weakness numbness depression  Prior Studies Any changes since last visit?  no  Physicians involved in your care Any changes since last visit?  no   Family History  Problem Relation Age of Onset  . Heart failure Mother   . Pneumonia Mother   . Hypertension Mother   . Heart disease Mother   . Stroke Maternal Grandfather   . Hypertension Maternal Grandfather   . Heart attack Father   . Heart disease Father   . Asthma Paternal Uncle        PAT UNCLES   . Heart attack Paternal Uncle    Social History   Socioeconomic History  . Marital status: Widowed    Spouse name: Not on file  . Number of children: Not on file  . Years of education: Not on file  . Highest education level: Not on file  Occupational History  . Not on file  Social Needs  . Financial resource strain: Not on file  . Food insecurity:    Worry: Not on file    Inability: Not on file  . Transportation needs:    Medical: Not on file    Non-medical: Not on file  Tobacco Use  . Smoking status: Never Smoker  . Smokeless tobacco: Never Used  Substance and Sexual Activity  . Alcohol use: No    Alcohol/week: 0.0 standard drinks  . Drug use: No  . Sexual activity: Never    Birth control/protection: Surgical  Lifestyle  . Physical activity:    Days per week: Not on file    Minutes per session: Not on file  . Stress: Not on file  Relationships  . Social connections:    Talks on phone: Not on file    Gets together: Not on file    Attends religious service: Not on file    Active member of club or organization: Not on file    Attends meetings of  clubs or organizations: Not on file    Relationship status: Not on file  Other Topics Concern  . Not on file  Social History Narrative  . Not on file   Past Surgical History:  Procedure Laterality Date  . ANKLE SURGERY Left    ligament  . APPENDECTOMY  1987   AT [facility]  . BACK SURGERY     Fusion  . BREAST LUMPECTOMY  2003   radiation on right  . BREAST LUMPECTOMY WITH RADIOACTIVE SEED LOCALIZATION Left 02/12/2016   Procedure: LEFT BREAST LUMPECTOMY WITH RADIOACTIVE SEED LOCALIZATION;  Surgeon: Excell Seltzer, MD;  Location: [facility];  Service: General;  Laterality: Left;  . CARDIOVASCULAR STRESS TEST  09/07/05   Nuclear, was negative  . CATARACT EXTRACTION Bilateral 01,03  . OOPHORECTOMY     LSO -RSO  . PELVIC LAPAROSCOPY  1989   RSO, LYSIS OF ADHESIONS  . TOTAL ABDOMINAL HYSTERECTOMY  1987    LSO, APPENDECTOMY  . TOTAL HIP ARTHROPLASTY Right 10/28/2013   dr Lorin Mercy  . TOTAL HIP ARTHROPLASTY Right 10/28/2013   Procedure: TOTAL HIP ARTHROPLASTY ANTERIOR APPROACH;  Surgeon: Marybelle Killings, MD;  Location: [facility];  Service: Orthopedics;  Laterality: Right;  Right Total Hip Arthroplasty, Direct Anterior Approach   Past Medical History:  Diagnosis Date  . Anemia    hx  . Arthritis   . Asthma    PFTs, February, 2011, moderate obstructive disease with response to bronchodilators, normal lung volumes, moderate reduction in diffusing capacity  . Atrial septal aneurysm    Echo, 2008-not noted on 13 echo  . CAD (coronary artery disease)    90% distal LAD in the past  /   nuclear, 2008, no ischemia, ejection fraction 70%  . Cancer ([place]) 2002   DUCTAL CIS--S/P LUMPECTOMY, RADIATION AND 6 WEEKS OF TAMOXIFEN  . D-dimer, elevated    January, 2014  . Depression   . Ejection fraction    EF 60%, echo, October, 2008  . Elevated CPK    January, 2014  . Endometriosis 1989   RIGHT TUBE  . Endometriosis 1987   LEFT TUBE/OVARY W FOCAL IN-SITU ENDOMETRIAL ADENOCARCINOMA  . GERD (gastroesophageal reflux disease)    occ  . H/O hiatal hernia    ?  [name] Hyperlipidemia   . Hypertension   . Hypothyroidism    Patient has had in the past that she does not need treatment  . Kyphoscoliosis   . Obstructive airway disease ([place])   . Pinched nerve    lower back  . Shortness of breath   . UTI (lower urinary tract infection)    BP (!) 142/74 Comment: home measurement  Temp 97.8 F (36.6 C)   Ht 5' (1.524 m)   Wt 162 lb (73.5 kg)   BMI 31.64 kg/m   Opioid Risk Score:   Fall Risk Score:  `1  Depression screen PHQ 2/9  Depression screen [facility] 2/9 05/16/2018 01/17/2018 06/02/2017 05/03/2017 01/27/2017 01/10/2017 01/02/2017  Decreased Interest 1 1 1 1 1  0 0  Down, Depressed, Hopeless 1 1 1 1 1 2 1   PHQ - 2 Score 2 2 2 2 2 2 1   Altered sleeping - - - - - 1 -  Tired, decreased energy - - - - - 1 -  Change in  appetite - - - - - 0 -  Feeling bad or failure about yourself  - - - - - 1 -  Trouble concentrating - - - - -  0 -  Moving slowly or fidgety/restless - - - - - 0 -  Suicidal thoughts - - - - - 0 -  PHQ-9 Score - - - - - 5 -  Difficult doing work/chores - - - - - Not difficult at all -  Some recent data might be hidden    Review of Systems  Constitutional: Negative.   HENT: Negative.   Eyes: Negative.   Respiratory: Positive for shortness of breath and wheezing.   Cardiovascular: Negative.   Gastrointestinal: Negative.   Endocrine: Negative.   Musculoskeletal: Positive for back pain and gait problem.  Skin: Negative.   Allergic/Immunologic: Negative.   Neurological: Positive for weakness and numbness.  Psychiatric/Behavioral: Positive for dysphoric mood.       Objective:   Physical Exam Vitals signs and nursing note reviewed.  Musculoskeletal:     Comments: No Physical Exam: Virtual Visit  Neurological:     Mental Status: She is oriented to person, place, and time.           Assessment & Plan:  1 Lumbar post laminectomy syndromewith severe kyphoscoliosis thoracolumbar spine, s/p lumbar fusion/ 08/09/2018 Continue current medication regimen.Refilled: Hydrocodone 10/325mg  one tablet every8hours may take an extra tablet when pain is severe#100. We will continue the opioid monitoring program, this consists of regular clinic visits, examinations, urine drug screen, pill counts as well as use of [place] Controlled Substance Reporting system. 2. Right Hip OA: S/P Right Hip Replacement 10/28/2013.08/09/2018 3.Depression: Continue Cymbalta. Has Family Support and Friends. Counseling with Doristine Bosworth. 08/09/2018 4.BilateralGreater Trochanteric Tenderness: No complaints today. Continue to Monitor. Continue with Ice and Heat Therapy.08/09/2018   F/U in 1 month Telephone call  Location of patient: In her Home Location of provider: Office Established patient  Time spent on call: 10 minutes

## 2018-08-23 ENCOUNTER — Other Ambulatory Visit: Payer: Self-pay | Admitting: *Deleted

## 2018-08-23 MED ORDER — GABAPENTIN 600 MG PO TABS: 600.0000 mg | ORAL_TABLET | Freq: Every day | ORAL | 3 refills | Status: DC

## 2018-09-10 ENCOUNTER — Other Ambulatory Visit: Payer: Self-pay | Admitting: Cardiology

## 2018-09-11 ENCOUNTER — Other Ambulatory Visit: Payer: Self-pay

## 2018-09-11 ENCOUNTER — Encounter: Payer: Self-pay | Admitting: Registered Nurse

## 2018-09-11 ENCOUNTER — Encounter: Payer: Medicare Other | Attending: Physical Medicine & Rehabilitation | Admitting: Registered Nurse

## 2018-09-11 DIAGNOSIS — M24551 Contracture, right hip: Secondary | ICD-10-CM

## 2018-09-11 DIAGNOSIS — M1611 Unilateral primary osteoarthritis, right hip: Secondary | ICD-10-CM | POA: Insufficient documentation

## 2018-09-11 DIAGNOSIS — M48062 Spinal stenosis, lumbar region with neurogenic claudication: Secondary | ICD-10-CM | POA: Diagnosis not present

## 2018-09-11 DIAGNOSIS — Z5181 Encounter for therapeutic drug level monitoring: Secondary | ICD-10-CM

## 2018-09-11 DIAGNOSIS — M4126 Other idiopathic scoliosis, lumbar region: Secondary | ICD-10-CM | POA: Insufficient documentation

## 2018-09-11 DIAGNOSIS — G894 Chronic pain syndrome: Secondary | ICD-10-CM | POA: Diagnosis not present

## 2018-09-11 DIAGNOSIS — M48061 Spinal stenosis, lumbar region without neurogenic claudication: Secondary | ICD-10-CM | POA: Insufficient documentation

## 2018-09-11 DIAGNOSIS — M961 Postlaminectomy syndrome, not elsewhere classified: Secondary | ICD-10-CM

## 2018-09-11 DIAGNOSIS — Z79891 Long term (current) use of opiate analgesic: Secondary | ICD-10-CM

## 2018-09-11 MED ORDER — HYDROCODONE-ACETAMINOPHEN 10-325 MG PO TABS: 1.0000 | ORAL_TABLET | Freq: Three times a day (TID) | ORAL | 0 refills | Status: DC | PRN

## 2018-09-11 NOTE — Progress Notes (Signed)
Subjective:    Patient ID: Hannah Scott, female    DOB: [birth year], 68 y.o.   MRN: [id number]  HPI: Hannah Scott is a 68 y.o. female who returns for follow up appointment her appointment was changed, due to national recommendations of social distancing due to [place] 19, an audio/video telehealth visit is felt to be most appropriate for this patient at this time.  See Chart message from today for the patient's consent to telehealth from [place].     She states her pain is located in her lower back. She rates her pain 6. Her . current exercise regime is walking.  Hannah Scott Morphine equivalent is 33.33 MME.  Last Oral Swab was Performed on 05/16/2018, it was consistent.   Hannah Scott asked the Health and History Questions. This provider and Hannah Scott verified we were  speaking with the correct person using two identifiers.  Pain Inventory Average Pain 7 Pain Right Now 6 My pain is constant, burning, tingling and aching  In the last 24 hours, has pain interfered with the following? General activity 10 Relation with others 10 Enjoyment of life 10 What TIME of day is your pain at its worst? anytime Sleep (in general) Poor  Pain is worse with: walking and standing Pain improves with: rest and medication Relief from Meds: 6  Mobility use a cane use a walker ability to climb steps?  yes do you drive?  yes  Function disabled: date disabled . I need assistance with the following:  household duties and shopping  Neuro/Psych weakness numbness tingling trouble walking spasms depression  Prior Studies Any changes since last visit?  no  Physicians involved in your care Any changes since last visit?  no   Family History  Problem Relation Age of Onset  . Heart failure Mother   . Pneumonia Mother   . Hypertension Mother   . Heart disease Mother   . Stroke Maternal Grandfather   . Hypertension Maternal Grandfather   . Heart  attack Father   . Heart disease Father   . Asthma Paternal Uncle        PAT UNCLES  . Heart attack Paternal Uncle    Social History   Socioeconomic History  . Marital status: Widowed    Spouse name: Not on file  . Number of children: Not on file  . Years of education: Not on file  . Highest education level: Not on file  Occupational History  . Not on file  Social Needs  . Financial resource strain: Not on file  . Food insecurity:    Worry: Not on file    Inability: Not on file  . Transportation needs:    Medical: Not on file    Non-medical: Not on file  Tobacco Use  . Smoking status: Never Smoker  . Smokeless tobacco: Never Used  Substance and Sexual Activity  . Alcohol use: No    Alcohol/week: 0.0 standard drinks  . Drug use: No  . Sexual activity: Never    Birth control/protection: Surgical  Lifestyle  . Physical activity:    Days per week: Not on file    Minutes per session: Not on file  . Stress: Not on file  Relationships  . Social connections:    Talks on phone: Not on file    Gets together: Not on file    Attends religious service: Not on file    Active member of club or organization:  Not on file    Attends meetings of clubs or organizations: Not on file    Relationship status: Not on file  Other Topics Concern  . Not on file  Social History Narrative  . Not on file   Past Surgical History:  Procedure Laterality Date  . ANKLE SURGERY Left    ligament  . APPENDECTOMY  1987   AT [facility]  . BACK SURGERY     Fusion  . BREAST LUMPECTOMY  2003   radiation on right  . BREAST LUMPECTOMY WITH RADIOACTIVE SEED LOCALIZATION Left 02/12/2016   Procedure: LEFT BREAST LUMPECTOMY WITH RADIOACTIVE SEED LOCALIZATION;  Surgeon: Excell Seltzer, MD;  Location: [facility];  Service: General;  Laterality: Left;  . CARDIOVASCULAR STRESS TEST  09/07/05   Nuclear, was negative  . CATARACT EXTRACTION Bilateral 01,03  . OOPHORECTOMY     LSO -RSO  . PELVIC  LAPAROSCOPY  1989   RSO, LYSIS OF ADHESIONS  . TOTAL ABDOMINAL HYSTERECTOMY  1987   LSO, APPENDECTOMY  . TOTAL HIP ARTHROPLASTY Right 10/28/2013   dr Lorin Mercy  . TOTAL HIP ARTHROPLASTY Right 10/28/2013   Procedure: TOTAL HIP ARTHROPLASTY ANTERIOR APPROACH;  Surgeon: Marybelle Killings, MD;  Location: [facility];  Service: Orthopedics;  Laterality: Right;  Right Total Hip Arthroplasty, Direct Anterior Approach   Past Medical History:  Diagnosis Date  . Anemia    hx  . Arthritis   . Asthma    PFTs, February, 2011, moderate obstructive disease with response to bronchodilators, normal lung volumes, moderate reduction in diffusing capacity  . Atrial septal aneurysm    Echo, 2008-not noted on 13 echo  . CAD (coronary artery disease)    90% distal LAD in the past  /   nuclear, 2008, no ischemia, ejection fraction 70%  . Cancer ([place]) 2002   DUCTAL CIS--S/P LUMPECTOMY, RADIATION AND 6 WEEKS OF TAMOXIFEN  . D-dimer, elevated    January, 2014  . Depression   . Ejection fraction    EF 60%, echo, October, 2008  . Elevated CPK    January, 2014  . Endometriosis 1989   RIGHT TUBE  . Endometriosis 1987   LEFT TUBE/OVARY W FOCAL IN-SITU ENDOMETRIAL ADENOCARCINOMA  . GERD (gastroesophageal reflux disease)    occ  . H/O hiatal hernia    ?  Hannah Scott Kitchen Hyperlipidemia   . Hypertension   . Hypothyroidism    Patient has had in the past that she does not need treatment  . Kyphoscoliosis   . Obstructive airway disease ([place])   . Pinched nerve    lower back  . Shortness of breath   . UTI (lower urinary tract infection)    There were no vitals taken for this visit.  Opioid Risk Score:   Fall Risk Score:  `1  Depression screen PHQ 2/9  Depression screen [facility] 2/9 09/11/2018 05/16/2018 01/17/2018 06/02/2017 05/03/2017 01/27/2017 01/10/2017  Decreased Interest 2 1 1 1 1 1  0  Down, Depressed, Hopeless 2 1 1 1 1 1 2   PHQ - 2 Score 4 2 2 2 2 2 2   Altered sleeping - - - - - - 1  Tired, decreased energy - - - - - - 1  Change in  appetite - - - - - - 0  Feeling bad or failure about yourself  - - - - - - 1  Trouble concentrating - - - - - - 0  Moving slowly or fidgety/restless - - - - - - 0  Suicidal thoughts - - - - - - 0  PHQ-9 Score - - - - - - 5  Difficult doing work/chores - - - - - - Not difficult at all  Some recent data might be hidden   Review of Systems  Constitutional: Negative.   HENT: Negative.   Eyes: Negative.   Respiratory: Negative.   Cardiovascular: Negative.   Gastrointestinal: Negative.   Endocrine: Negative.   Genitourinary: Negative.   Musculoskeletal: Positive for gait problem.  Skin: Negative.   Allergic/Immunologic: Negative.   Neurological: Positive for weakness and numbness.  Hematological: Negative.   Psychiatric/Behavioral: Positive for dysphoric mood.  All other systems reviewed and are negative.      Objective:   Physical Exam Vitals signs and nursing note reviewed.  Musculoskeletal:     Comments: No Physical Exam Performed: Virtual Visit  Neurological:     Mental Status: She is oriented to person, place, and time.           Assessment & Plan:   Lumbar post laminectomy syndromewith severe kyphoscoliosis thoracolumbar spine, s/p lumbar fusion/ 09/11/2018 Continue current medication regimen.Refilled: Hydrocodone 10/325mg  one tablet every8hours may take an extra tablet when pain is severe#100. We will continue the opioid monitoring program, this consists of regular clinic visits, examinations, urine drug screen, pill counts as well as use of [place] Controlled Substance Reporting system. 2. Right Hip OA: S/P Right Hip Replacement 10/28/2013.09/11/2018 3.Depression: Continue Cymbalta. Has Family Support and Friends. Counseling with Doristine Bosworth. 09/11/2018 4.BilateralGreater Trochanteric Tenderness: No complaints today. Continue to Monitor. Continue with Ice and Heat Therapy.09/11/2018  Telephone Call  Location of patient: In her Home Location of  provider: Office Established patient Time spent on call: 10 Minutes

## 2018-09-28 ENCOUNTER — Other Ambulatory Visit: Payer: Self-pay | Admitting: Cardiology

## 2018-09-28 DIAGNOSIS — I2510 Atherosclerotic heart disease of native coronary artery without angina pectoris: Secondary | ICD-10-CM

## 2018-09-28 DIAGNOSIS — R0600 Dyspnea, unspecified: Secondary | ICD-10-CM

## 2018-09-28 DIAGNOSIS — E782 Mixed hyperlipidemia: Secondary | ICD-10-CM

## 2018-09-28 DIAGNOSIS — R072 Precordial pain: Secondary | ICD-10-CM

## 2018-09-28 DIAGNOSIS — R5383 Other fatigue: Secondary | ICD-10-CM

## 2018-09-28 DIAGNOSIS — I10 Essential (primary) hypertension: Secondary | ICD-10-CM

## 2018-09-28 DIAGNOSIS — R0609 Other forms of dyspnea: Secondary | ICD-10-CM

## 2018-10-11 ENCOUNTER — Other Ambulatory Visit: Payer: Self-pay

## 2018-10-11 ENCOUNTER — Encounter: Payer: Medicare Other | Attending: Physical Medicine & Rehabilitation | Admitting: Registered Nurse

## 2018-10-11 ENCOUNTER — Encounter: Payer: Self-pay | Admitting: Registered Nurse

## 2018-10-11 VITALS — BP 146/89 | HR 75 | Temp 97.6°F | Resp 16 | Ht 60.0 in | Wt 167.0 lb

## 2018-10-11 DIAGNOSIS — M4126 Other idiopathic scoliosis, lumbar region: Secondary | ICD-10-CM | POA: Insufficient documentation

## 2018-10-11 DIAGNOSIS — M24551 Contracture, right hip: Secondary | ICD-10-CM | POA: Diagnosis not present

## 2018-10-11 DIAGNOSIS — M48062 Spinal stenosis, lumbar region with neurogenic claudication: Secondary | ICD-10-CM

## 2018-10-11 DIAGNOSIS — M1611 Unilateral primary osteoarthritis, right hip: Secondary | ICD-10-CM | POA: Diagnosis not present

## 2018-10-11 DIAGNOSIS — M961 Postlaminectomy syndrome, not elsewhere classified: Secondary | ICD-10-CM | POA: Diagnosis not present

## 2018-10-11 DIAGNOSIS — M419 Scoliosis, unspecified: Secondary | ICD-10-CM

## 2018-10-11 DIAGNOSIS — M48061 Spinal stenosis, lumbar region without neurogenic claudication: Secondary | ICD-10-CM | POA: Insufficient documentation

## 2018-10-11 DIAGNOSIS — Z79891 Long term (current) use of opiate analgesic: Secondary | ICD-10-CM

## 2018-10-11 DIAGNOSIS — G894 Chronic pain syndrome: Secondary | ICD-10-CM | POA: Diagnosis not present

## 2018-10-11 DIAGNOSIS — Z5181 Encounter for therapeutic drug level monitoring: Secondary | ICD-10-CM

## 2018-10-11 MED ORDER — HYDROCODONE-ACETAMINOPHEN 10-325 MG PO TABS: 1.0000 | ORAL_TABLET | Freq: Three times a day (TID) | ORAL | 0 refills | Status: DC | PRN

## 2018-10-11 NOTE — Progress Notes (Signed)
Subjective:    Patient ID: Hannah Scott, female    DOB: Mar 02, 1951, 68 y.o.   MRN: [id number]  HPI: Hannah Scott is a 68 y.o. female who returns for follow up appointment for chronic pain and medication refill. She states her pain is located in her lower back and occasionally in her right hip. She rates her  Pain 7. Her current exercise regime is walking.  Ms. Mullan Morphine equivalent is 33.33  MME.  Last Oral Swab was Performed on 05/16/2018, it was consistent.    Pain Inventory Average Pain 8 Pain Right Now 7 My pain is burning, tingling and aching  In the last 24 hours, has pain interfered with the following? General activity 10 Relation with others 10 Enjoyment of life 10 What TIME of day is your pain at its worst? morning, night, daytime, evening Sleep (in general) Poor  Pain is worse with: walking and standing Pain improves with: rest and medication Relief from Meds: 4  Mobility use a cane use a walker ability to climb steps?  yes do you drive?  yes  Function disabled: date disabled n/a Do you have any goals in this area?  yes household duties  Neuro/Psych weakness numbness tingling depression  Prior Studies n/a  Physicians involved in your care n/a   Family History  Problem Relation Age of Onset  . Heart failure Mother   . Pneumonia Mother   . Hypertension Mother   . Heart disease Mother   . Stroke Maternal Grandfather   . Hypertension Maternal Grandfather   . Heart attack Father   . Heart disease Father   . Asthma Paternal Uncle        PAT UNCLES  . Heart attack Paternal Uncle    Social History   Socioeconomic History  . Marital status: Widowed    Spouse name: Not on file  . Number of children: Not on file  . Years of education: Not on file  . Highest education level: Not on file  Occupational History  . Not on file  Social Needs  . Financial resource strain: Not on file  . Food insecurity    Worry: Not on file    Inability: Not on  file  . Transportation needs    Medical: Not on file    Non-medical: Not on file  Tobacco Use  . Smoking status: Never Smoker  . Smokeless tobacco: Never Used  Substance and Sexual Activity  . Alcohol use: No    Alcohol/week: 0.0 standard drinks  . Drug use: No  . Sexual activity: Never    Birth control/protection: Surgical  Lifestyle  . Physical activity    Days per week: Not on file    Minutes per session: Not on file  . Stress: Not on file  Relationships  . Social Herbalist on phone: Not on file    Gets together: Not on file    Attends religious service: Not on file    Active member of club or organization: Not on file    Attends meetings of clubs or organizations: Not on file    Relationship status: Not on file  Other Topics Concern  . Not on file  Social History Narrative  . Not on file   Past Surgical History:  Procedure Laterality Date  . ANKLE SURGERY Left    ligament  . APPENDECTOMY  1987   AT [facility]  . BACK SURGERY     Fusion  .  BREAST LUMPECTOMY  2003   radiation on right  . BREAST LUMPECTOMY WITH RADIOACTIVE SEED LOCALIZATION Left 02/12/2016   Procedure: LEFT BREAST LUMPECTOMY WITH RADIOACTIVE SEED LOCALIZATION;  Surgeon: Excell Seltzer, MD;  Location: [place];  Service: General;  Laterality: Left;  . CARDIOVASCULAR STRESS TEST  09/07/05   Nuclear, was negative  . CATARACT EXTRACTION Bilateral 01,03  . OOPHORECTOMY     LSO -RSO  . PELVIC LAPAROSCOPY  1989   RSO, LYSIS OF ADHESIONS  . TOTAL ABDOMINAL HYSTERECTOMY  1987   LSO, APPENDECTOMY  . TOTAL HIP ARTHROPLASTY Right 10/28/2013   dr Lorin Mercy  . TOTAL HIP ARTHROPLASTY Right 10/28/2013   Procedure: TOTAL HIP ARTHROPLASTY ANTERIOR APPROACH;  Surgeon: Marybelle Killings, MD;  Location: [place];  Service: Orthopedics;  Laterality: Right;  Right Total Hip Arthroplasty, Direct Anterior Approach   Past Medical History:  Diagnosis Date  . Anemia    hx  . Arthritis   . Asthma    PFTs,  February, 2011, moderate obstructive disease with response to bronchodilators, normal lung volumes, moderate reduction in diffusing capacity  . Atrial septal aneurysm    Echo, 2008-not noted on 13 echo  . CAD (coronary artery disease)    90% distal LAD in the past  /   nuclear, 2008, no ischemia, ejection fraction 70%  . Cancer ([place]) 2002   DUCTAL CIS--S/P LUMPECTOMY, RADIATION AND 6 WEEKS OF TAMOXIFEN  . D-dimer, elevated    January, 2014  . Depression   . Ejection fraction    EF 60%, echo, October, 2008  . Elevated CPK    January, 2014  . Endometriosis 1989   RIGHT TUBE  . Endometriosis 1987   LEFT TUBE/OVARY W FOCAL IN-SITU ENDOMETRIAL ADENOCARCINOMA  . GERD (gastroesophageal reflux disease)    occ  . H/O hiatal hernia    ?  [name] Hyperlipidemia   . Hypertension   . Hypothyroidism    Patient has had in the past that she does not need treatment  . Kyphoscoliosis   . Obstructive airway disease ([place])   . Pinched nerve    lower back  . Shortness of breath   . UTI (lower urinary tract infection)    Temp 97.6 F (36.4 C)   Ht 5' (1.524 m)   Wt 167 lb (75.8 kg)   BMI 32.61 kg/m   Opioid Risk Score:   Fall Risk Score:  `1  Depression screen PHQ 2/9  Depression screen [facility] 2/9 09/11/2018 05/16/2018 01/17/2018 06/02/2017 05/03/2017 01/27/2017 01/10/2017  Decreased Interest 2 1 1 1 1 1  0  Down, Depressed, Hopeless 2 1 1 1 1 1 2   PHQ - 2 Score 4 2 2 2 2 2 2   Altered sleeping - - - - - - 1  Tired, decreased energy - - - - - - 1  Change in appetite - - - - - - 0  Feeling bad or failure about yourself  - - - - - - 1  Trouble concentrating - - - - - - 0  Moving slowly or fidgety/restless - - - - - - 0  Suicidal thoughts - - - - - - 0  PHQ-9 Score - - - - - - 5  Difficult doing work/chores - - - - - - Not difficult at all  Some recent data might be hidden      Review of Systems     Objective:   Physical Exam Vitals signs and nursing note reviewed.  Constitutional:       Appearance: Normal appearance.  Neck:     Musculoskeletal: Normal range of motion and neck supple.  Cardiovascular:     Rate and Rhythm: Normal rate and regular rhythm.     Pulses: Normal pulses.     Heart sounds: Normal heart sounds.  Pulmonary:     Effort: Pulmonary effort is normal.     Breath sounds: Normal breath sounds.  Musculoskeletal:     Comments: Normal Muscle Bulk and Muscle Testing Reveals:  Upper Extremities: Full ROM and Muscle Strength 5/5 Lumbar Paraspinal Tenderness: L-4-L-5 Lower Extremities: Full ROM and Muscle Strength 5/5 Arises from chair slowly using walker for support Antalgic  Gait   Skin:    General: Skin is warm and dry.  Neurological:     Mental Status: She is alert and oriented to person, place, and time.  Psychiatric:        Mood and Affect: Mood normal.        Behavior: Behavior normal.           Assessment & Plan:  1. Lumbar post laminectomy syndromewith severe kyphoscoliosis thoracolumbar spine, s/p lumbar fusion/ 10/11/2018 Continue current medication regimen.Refilled: Hydrocodone 10/325mg  one tablet every8hours may take an extra tablet when pain is severe#100. We will continue the opioid monitoring program, this consists of regular clinic visits, examinations, urine drug screen, pill counts as well as use of [place] Controlled Substance Reporting system. 2. Right Hip OA: S/P Right Hip Replacement 10/28/2013.10/11/2018 3.Depression: Continue Cymbalta. Has Family Support and Friends. Counseling with Doristine Bosworth. 10/11/2018 4.BilateralGreater Trochanteric Tenderness:No complaints today.Continue to Monitor. Continue with Ice and Heat Therapy.10/11/2018  15 minutes of face to face patient care time was spent during this visit. All questions were encouraged and answered.  F/U in 1 month

## 2018-10-14 ENCOUNTER — Other Ambulatory Visit: Payer: Self-pay | Admitting: Internal Medicine

## 2018-11-08 ENCOUNTER — Encounter: Payer: Medicare Other | Attending: Physical Medicine & Rehabilitation | Admitting: Registered Nurse

## 2018-11-08 ENCOUNTER — Encounter: Payer: Self-pay | Admitting: Registered Nurse

## 2018-11-08 VITALS — BP 166/98 | HR 74 | Temp 97.6°F | Wt 166.0 lb

## 2018-11-08 DIAGNOSIS — M48061 Spinal stenosis, lumbar region without neurogenic claudication: Secondary | ICD-10-CM | POA: Diagnosis not present

## 2018-11-08 DIAGNOSIS — M1611 Unilateral primary osteoarthritis, right hip: Secondary | ICD-10-CM | POA: Diagnosis not present

## 2018-11-08 DIAGNOSIS — M4126 Other idiopathic scoliosis, lumbar region: Secondary | ICD-10-CM | POA: Insufficient documentation

## 2018-11-08 DIAGNOSIS — Z79891 Long term (current) use of opiate analgesic: Secondary | ICD-10-CM

## 2018-11-08 DIAGNOSIS — M24551 Contracture, right hip: Secondary | ICD-10-CM | POA: Diagnosis not present

## 2018-11-08 DIAGNOSIS — M961 Postlaminectomy syndrome, not elsewhere classified: Secondary | ICD-10-CM | POA: Insufficient documentation

## 2018-11-08 DIAGNOSIS — M48062 Spinal stenosis, lumbar region with neurogenic claudication: Secondary | ICD-10-CM | POA: Diagnosis not present

## 2018-11-08 DIAGNOSIS — G894 Chronic pain syndrome: Secondary | ICD-10-CM

## 2018-11-08 DIAGNOSIS — Z5181 Encounter for therapeutic drug level monitoring: Secondary | ICD-10-CM

## 2018-11-08 DIAGNOSIS — M419 Scoliosis, unspecified: Secondary | ICD-10-CM

## 2018-11-08 MED ORDER — HYDROCODONE-ACETAMINOPHEN 10-325 MG PO TABS: 1.0000 | ORAL_TABLET | Freq: Three times a day (TID) | ORAL | 0 refills | Status: DC | PRN

## 2018-11-08 NOTE — Progress Notes (Signed)
Subjective:    Patient ID: [name], female    DOB: [birth year], 68 y.o.   MRN: [id number]  HPI: Hannah Scott is a 68 y.o. female who returns for follow up appointment for chronic pain and medication refill. She states her pain is located in her lower back. She rates her pain 7. Her current exercise regime is walking.  Ms. Manz Morphine equivalent is 33.33 MME.  Last Oral Swab was Performed on 05/16/2018, it was consistent.   Pain Inventory Average Pain 7 Pain Right Now 7 My pain is constant, burning, stabbing and tingling  In the last 24 hours, has pain interfered with the following? General activity 10 Relation with others 10 Enjoyment of life 10 What TIME of day is your pain at its worst? all the time Sleep (in general) Poor  Pain is worse with: walking and standing Pain improves with: rest and medication Relief from Meds: 4  Mobility use a cane use a walker ability to climb steps?  yes do you drive?  yes  Function disabled: date disabled n/a I need assistance with the following:  household duties  Neuro/Psych weakness numbness tingling trouble walking spasms  Prior Studies no  Physicians involved in your care no   Family History  Problem Relation Age of Onset  . Heart failure Mother   . Pneumonia Mother   . Hypertension Mother   . Heart disease Mother   . Stroke Maternal Grandfather   . Hypertension Maternal Grandfather   . Heart attack Father   . Heart disease Father   . Asthma Paternal Uncle        PAT UNCLES  . Heart attack Paternal Uncle    Social History   Socioeconomic History  . Marital status: Widowed    Spouse name: Not on file  . Number of children: Not on file  . Years of education: Not on file  . Highest education level: Not on file  Occupational History  . Not on file  Social Needs  . Financial resource strain: Not on file  . Food insecurity    Worry: Not on file    Inability: Not on file  . Transportation needs   Medical: Not on file    Non-medical: Not on file  Tobacco Use  . Smoking status: Never Smoker  . Smokeless tobacco: Never Used  Substance and Sexual Activity  . Alcohol use: No    Alcohol/week: 0.0 standard drinks  . Drug use: No  . Sexual activity: Never    Birth control/protection: Surgical  Lifestyle  . Physical activity    Days per week: Not on file    Minutes per session: Not on file  . Stress: Not on file  Relationships  . Social Herbalist on phone: Not on file    Gets together: Not on file    Attends religious service: Not on file    Active member of club or organization: Not on file    Attends meetings of clubs or organizations: Not on file    Relationship status: Not on file  Other Topics Concern  . Not on file  Social History Narrative  . Not on file   Past Surgical History:  Procedure Laterality Date  . ANKLE SURGERY Left    ligament  . APPENDECTOMY  1987   AT [facility]  . BACK SURGERY     Fusion  . BREAST LUMPECTOMY  2003   radiation on right  . BREAST  LUMPECTOMY WITH RADIOACTIVE SEED LOCALIZATION Left 02/12/2016   Procedure: LEFT BREAST LUMPECTOMY WITH RADIOACTIVE SEED LOCALIZATION;  Surgeon: Excell Seltzer, MD;  Location: [place];  Service: General;  Laterality: Left;  . CARDIOVASCULAR STRESS TEST  09/07/05   Nuclear, was negative  . CATARACT EXTRACTION Bilateral 01,03  . OOPHORECTOMY     LSO -RSO  . PELVIC LAPAROSCOPY  1989   RSO, LYSIS OF ADHESIONS  . TOTAL ABDOMINAL HYSTERECTOMY  1987   LSO, APPENDECTOMY  . TOTAL HIP ARTHROPLASTY Right 10/28/2013   dr Lorin Mercy  . TOTAL HIP ARTHROPLASTY Right 10/28/2013   Procedure: TOTAL HIP ARTHROPLASTY ANTERIOR APPROACH;  Surgeon: Marybelle Killings, MD;  Location: [place];  Service: Orthopedics;  Laterality: Right;  Right Total Hip Arthroplasty, Direct Anterior Approach   Past Medical History:  Diagnosis Date  . Anemia    hx  . Arthritis   . Asthma    PFTs, February, 2011, moderate  obstructive disease with response to bronchodilators, normal lung volumes, moderate reduction in diffusing capacity  . Atrial septal aneurysm    Echo, 2008-not noted on 13 echo  . CAD (coronary artery disease)    90% distal LAD in the past  /   nuclear, 2008, no ischemia, ejection fraction 70%  . Cancer ([place]) 2002   DUCTAL CIS--S/P LUMPECTOMY, RADIATION AND 6 WEEKS OF TAMOXIFEN  . D-dimer, elevated    January, 2014  . Depression   . Ejection fraction    EF 60%, echo, October, 2008  . Elevated CPK    January, 2014  . Endometriosis 1989   RIGHT TUBE  . Endometriosis 1987   LEFT TUBE/OVARY W FOCAL IN-SITU ENDOMETRIAL ADENOCARCINOMA  . GERD (gastroesophageal reflux disease)    occ  . H/O hiatal hernia    ?  [name] Hyperlipidemia   . Hypertension   . Hypothyroidism    Patient has had in the past that she does not need treatment  . Kyphoscoliosis   . Obstructive airway disease ([place])   . Pinched nerve    lower back  . Shortness of breath   . UTI (lower urinary tract infection)    BP (!) 166/98   Pulse 74   Temp 97.6 F (36.4 C)   Wt 166 lb (75.3 kg)   SpO2 91%   BMI 32.42 kg/m   Opioid Risk Score:   Fall Risk Score:  `1  Depression screen PHQ 2/9  Depression screen [facility] 2/9 09/11/2018 05/16/2018 01/17/2018 06/02/2017 05/03/2017 01/27/2017 01/10/2017  Decreased Interest 2 1 1 1 1 1  0  Down, Depressed, Hopeless 2 1 1 1 1 1 2   PHQ - 2 Score 4 2 2 2 2 2 2   Altered sleeping - - - - - - 1  Tired, decreased energy - - - - - - 1  Change in appetite - - - - - - 0  Feeling bad or failure about yourself  - - - - - - 1  Trouble concentrating - - - - - - 0  Moving slowly or fidgety/restless - - - - - - 0  Suicidal thoughts - - - - - - 0  PHQ-9 Score - - - - - - 5  Difficult doing work/chores - - - - - - Not difficult at all  Some recent data might be hidden     Review of Systems  All other systems reviewed and are negative.      Objective:   Physical Exam Vitals signs and  nursing  note reviewed.  Constitutional:      Appearance: Normal appearance.  Neck:     Musculoskeletal: Normal range of motion and neck supple.  Cardiovascular:     Rate and Rhythm: Normal rate and regular rhythm.     Pulses: Normal pulses.     Heart sounds: Normal heart sounds.  Pulmonary:     Effort: Pulmonary effort is normal.     Breath sounds: Normal breath sounds.  Musculoskeletal:     Comments: Normal Muscle Bulk and Muscle Testing Reveals:  Upper Extremities: Full ROM and Muscle Strength 5/5  Lumbar Paraspinal Tenderness: L-3-L-5 Lower Extremities: Full ROM and Muscle Strength 5/5 Arises from Table Slowly Antalgic Gait   Skin:    General: Skin is warm and dry.  Neurological:     Mental Status: She is alert and oriented to person, place, and time.  Psychiatric:        Mood and Affect: Mood normal.        Behavior: Behavior normal.           Assessment & Plan:  1. Lumbar post laminectomy syndromewith severe kyphoscoliosis thoracolumbar spine, s/p lumbar fusion/ 11/08/2018 Continue current medication regimen.Refilled: Hydrocodone 10/325mg  one tablet every8hours may take an extra tablet when pain is severe#100. We will continue the opioid monitoring program, this consists of regular clinic visits, examinations, urine drug screen, pill counts as well as use of [place] Controlled Substance Reporting system. 2. Right Hip OA: S/P Right Hip Replacement 10/28/2013.11/08/2018 3.Depression: Continue Cymbalta. Has Family Support and Friends. Counseling with Doristine Bosworth. 11/08/2018 4.BilateralGreater Trochanteric Tenderness:No complaints today.Continue to Monitor. Continue with Ice and Heat Therapy.11/08/2018  15 minutes of face to face patient care time was spent during this visit. All questions were encouraged and answered.  F/U in 1 month

## 2018-12-14 ENCOUNTER — Other Ambulatory Visit: Payer: Self-pay | Admitting: *Deleted

## 2018-12-14 MED ORDER — GABAPENTIN 600 MG PO TABS: 600.0000 mg | ORAL_TABLET | Freq: Every day | ORAL | 5 refills | Status: DC

## 2018-12-18 ENCOUNTER — Encounter: Payer: Medicare Other | Attending: Physical Medicine & Rehabilitation | Admitting: Registered Nurse

## 2018-12-18 ENCOUNTER — Encounter: Payer: Self-pay | Admitting: Registered Nurse

## 2018-12-18 ENCOUNTER — Other Ambulatory Visit: Payer: Self-pay

## 2018-12-18 VITALS — BP 147/83 | HR 65 | Temp 98.7°F | Resp 16 | Ht 60.0 in | Wt 168.0 lb

## 2018-12-18 DIAGNOSIS — Z79891 Long term (current) use of opiate analgesic: Secondary | ICD-10-CM | POA: Diagnosis not present

## 2018-12-18 DIAGNOSIS — Z5181 Encounter for therapeutic drug level monitoring: Secondary | ICD-10-CM | POA: Diagnosis not present

## 2018-12-18 DIAGNOSIS — M4126 Other idiopathic scoliosis, lumbar region: Secondary | ICD-10-CM | POA: Diagnosis not present

## 2018-12-18 DIAGNOSIS — M961 Postlaminectomy syndrome, not elsewhere classified: Secondary | ICD-10-CM | POA: Diagnosis not present

## 2018-12-18 DIAGNOSIS — M7061 Trochanteric bursitis, right hip: Secondary | ICD-10-CM

## 2018-12-18 DIAGNOSIS — M1611 Unilateral primary osteoarthritis, right hip: Secondary | ICD-10-CM | POA: Diagnosis not present

## 2018-12-18 DIAGNOSIS — M48061 Spinal stenosis, lumbar region without neurogenic claudication: Secondary | ICD-10-CM | POA: Insufficient documentation

## 2018-12-18 DIAGNOSIS — G894 Chronic pain syndrome: Secondary | ICD-10-CM

## 2018-12-18 DIAGNOSIS — M24551 Contracture, right hip: Secondary | ICD-10-CM

## 2018-12-18 DIAGNOSIS — M48062 Spinal stenosis, lumbar region with neurogenic claudication: Secondary | ICD-10-CM

## 2018-12-18 MED ORDER — HYDROCODONE-ACETAMINOPHEN 10-325 MG PO TABS: 1.0000 | ORAL_TABLET | Freq: Three times a day (TID) | ORAL | 0 refills | Status: DC | PRN

## 2018-12-18 NOTE — Progress Notes (Signed)
Subjective:    Patient ID: Hannah Scott, female    DOB: 1951/01/23, 68 y.o.   MRN: [id number]  HPI: [name] is a 68 y.o. female who returns for follow up appointment for chronic pain and medication refill. She states her pain is located in her lower back pain and right hip pain. She rates her pain 7. Her current exercise regime is walking and performing stretching exercises.  [name] Morphine equivalent is 33.33 MME.  Last Oral Swab was Performed on 05/16/2018, it was consistent. Oral Swab was Performed today.   Pain Inventory Average Pain 7 Pain Right Now 7 My pain is constant, burning, tingling and aching  In the last 24 hours, has pain interfered with the following? General activity 10 Relation with others 10 Enjoyment of life 10 What TIME of day is your pain at its worst? all Sleep (in general) Poor  Pain is worse with: walking and standing Pain improves with: rest and medication Relief from Meds: 4  Mobility walk with assistance use a cane use a walker ability to climb steps?  yes do you drive?  yes  Function I need assistance with the following:  household duties and shopping  Neuro/Psych weakness tingling trouble walking depression  Prior Studies Any changes since last visit?  no  Physicians involved in your care Any changes since last visit?  no   Family History  Problem Relation Age of Onset  . Heart failure Mother   . Pneumonia Mother   . Hypertension Mother   . Heart disease Mother   . Stroke Maternal Grandfather   . Hypertension Maternal Grandfather   . Heart attack Father   . Heart disease Father   . Asthma Paternal Uncle        PAT UNCLES  . Heart attack Paternal Uncle    Social History   Socioeconomic History  . Marital status: Widowed    Spouse name: Not on file  . Number of children: Not on file  . Years of education: Not on file  . Highest education level: Not on file  Occupational History  . Not on file  Social Needs  .  Financial resource strain: Not on file  . Food insecurity    Worry: Not on file    Inability: Not on file  . Transportation needs    Medical: Not on file    Non-medical: Not on file  Tobacco Use  . Smoking status: Never Smoker  . Smokeless tobacco: Never Used  Substance and Sexual Activity  . Alcohol use: No    Alcohol/week: 0.0 standard drinks  . Drug use: No  . Sexual activity: Never    Birth control/protection: Surgical  Lifestyle  . Physical activity    Days per week: Not on file    Minutes per session: Not on file  . Stress: Not on file  Relationships  . Social Herbalist on phone: Not on file    Gets together: Not on file    Attends religious service: Not on file    Active member of club or organization: Not on file    Attends meetings of clubs or organizations: Not on file    Relationship status: Not on file  Other Topics Concern  . Not on file  Social History Narrative  . Not on file   Past Surgical History:  Procedure Laterality Date  . ANKLE SURGERY Left    ligament  . APPENDECTOMY  1987  AT [facility]  . BACK SURGERY     Fusion  . BREAST LUMPECTOMY  2003   radiation on right  . BREAST LUMPECTOMY WITH RADIOACTIVE SEED LOCALIZATION Left 02/12/2016   Procedure: LEFT BREAST LUMPECTOMY WITH RADIOACTIVE SEED LOCALIZATION;  Surgeon: Excell Seltzer, MD;  Location: [place];  Service: General;  Laterality: Left;  . CARDIOVASCULAR STRESS TEST  09/07/05   Nuclear, was negative  . CATARACT EXTRACTION Bilateral 01,03  . OOPHORECTOMY     LSO -RSO  . PELVIC LAPAROSCOPY  1989   RSO, LYSIS OF ADHESIONS  . TOTAL ABDOMINAL HYSTERECTOMY  1987   LSO, APPENDECTOMY  . TOTAL HIP ARTHROPLASTY Right 10/28/2013   dr Lorin Mercy  . TOTAL HIP ARTHROPLASTY Right 10/28/2013   Procedure: TOTAL HIP ARTHROPLASTY ANTERIOR APPROACH;  Surgeon: Marybelle Killings, MD;  Location: [place];  Service: Orthopedics;  Laterality: Right;  Right Total Hip Arthroplasty, Direct Anterior  Approach   Past Medical History:  Diagnosis Date  . Anemia    hx  . Arthritis   . Asthma    PFTs, February, 2011, moderate obstructive disease with response to bronchodilators, normal lung volumes, moderate reduction in diffusing capacity  . Atrial septal aneurysm    Echo, 2008-not noted on 13 echo  . CAD (coronary artery disease)    90% distal LAD in the past  /   nuclear, 2008, no ischemia, ejection fraction 70%  . Cancer ([place]) 2002   DUCTAL CIS--S/P LUMPECTOMY, RADIATION AND 6 WEEKS OF TAMOXIFEN  . D-dimer, elevated    January, 2014  . Depression   . Ejection fraction    EF 60%, echo, October, 2008  . Elevated CPK    January, 2014  . Endometriosis 1989   RIGHT TUBE  . Endometriosis 1987   LEFT TUBE/OVARY W FOCAL IN-SITU ENDOMETRIAL ADENOCARCINOMA  . GERD (gastroesophageal reflux disease)    occ  . H/O hiatal hernia    ?  [name] Hyperlipidemia   . Hypertension   . Hypothyroidism    Patient has had in the past that she does not need treatment  . Kyphoscoliosis   . Obstructive airway disease ([place])   . Pinched nerve    lower back  . Shortness of breath   . UTI (lower urinary tract infection)    There were no vitals taken for this visit.  Opioid Risk Score:   Fall Risk Score:  `1  Depression screen PHQ 2/9  Depression screen [facility] 2/9 09/11/2018 05/16/2018 01/17/2018 06/02/2017 05/03/2017 01/27/2017 01/10/2017  Decreased Interest 2 1 1 1 1 1  0  Down, Depressed, Hopeless 2 1 1 1 1 1 2   PHQ - 2 Score 4 2 2 2 2 2 2   Altered sleeping - - - - - - 1  Tired, decreased energy - - - - - - 1  Change in appetite - - - - - - 0  Feeling bad or failure about yourself  - - - - - - 1  Trouble concentrating - - - - - - 0  Moving slowly or fidgety/restless - - - - - - 0  Suicidal thoughts - - - - - - 0  PHQ-9 Score - - - - - - 5  Difficult doing work/chores - - - - - - Not difficult at all  Some recent data might be hidden     Review of Systems  Constitutional: Negative.   HENT:  Negative.   Eyes: Negative.   Respiratory: Negative.   Cardiovascular:  Negative.   Gastrointestinal: Negative.   Endocrine: Negative.   Genitourinary: Negative.   Musculoskeletal: Positive for back pain, gait problem and myalgias.  Skin: Negative.   Allergic/Immunologic: Negative.   Neurological: Positive for weakness.  Hematological: Negative.   Psychiatric/Behavioral: Positive for dysphoric mood.  All other systems reviewed and are negative.      Objective:   Physical Exam Vitals signs and nursing note reviewed.  Constitutional:      Appearance: Normal appearance.  Neck:     Musculoskeletal: Normal range of motion and neck supple.  Cardiovascular:     Rate and Rhythm: Normal rate and regular rhythm.     Pulses: Normal pulses.     Heart sounds: Normal heart sounds.  Pulmonary:     Effort: Pulmonary effort is normal.     Breath sounds: Normal breath sounds.  Musculoskeletal:     Comments: Normal Muscle Bulk and Muscle Testing Reveals:  Upper Extremities: Full ROM and Muscle Strength 5/5 Lumbar Paraspinal Tenderness: L-4-L-5 Lower Extremities: Full ROM and Muscle Strength 5/5 Arises from Table slowly using walker for support Antalgic  Gait   Skin:    General: Skin is warm and dry.  Neurological:     Mental Status: She is alert and oriented to person, place, and time.  Psychiatric:        Mood and Affect: Mood normal.        Behavior: Behavior normal.           Assessment & Plan:  1.Lumbar post laminectomy syndromewith severe kyphoscoliosis thoracolumbar spine, s/p lumbar fusion/ 12/18/2018 Continue current medication regimen.Refilled: Hydrocodone 10/325mg  one tablet every8hours may take an extra tablet when pain is severe#100. We will continue the opioid monitoring program, this consists of regular clinic visits, examinations, urine drug screen, pill counts as well as use of [place] Controlled Substance Reporting system. 2. Right Hip OA: S/P Right  Hip Replacement 10/28/2013.12/18/2018 3.Depression: Continue Cymbalta. Has Family Support and Friends. Counseling with Doristine Bosworth. 12/18/2018 4.Right Greater Trochanteric Tenderness:Continue to Monitor. Continue with Ice and Heat Therapy.12/18/2018  47minutes of face to face patient care time was spent during this visit. All questions were encouraged and answered.  F/U in 1 month

## 2018-12-22 LAB — DRUG TOX MONITOR 1 W/CONF, ORAL FLD
Amphetamines: NEGATIVE ng/mL (ref <10)
Barbiturates: NEGATIVE ng/mL (ref <10)
Benzodiazepines: NEGATIVE ng/mL (ref <0.50)
Buprenorphine: NEGATIVE ng/mL (ref <0.10)
Cocaine: NEGATIVE ng/mL (ref <5.0)
Codeine: NEGATIVE ng/mL (ref <2.5)
Dihydrocodeine: 40.6 ng/mL — ABNORMAL HIGH (ref <2.5)
Fentanyl: NEGATIVE ng/mL (ref <0.10)
Heroin Metabolite: NEGATIVE ng/mL (ref <1.0)
Hydrocodone: >250 ng/mL — ABNORMAL HIGH (ref <2.5)
Hydromorphone: NEGATIVE ng/mL (ref <2.5)
MARIJUANA: NEGATIVE ng/mL (ref <2.5)
MDMA: NEGATIVE ng/mL (ref <10)
Meprobamate: NEGATIVE ng/mL (ref <2.5)
Methadone: NEGATIVE ng/mL (ref <5.0)
Morphine: NEGATIVE ng/mL (ref <2.5)
Nicotine Metabolite: NEGATIVE ng/mL (ref <5.0)
Norhydrocodone: 7.9 ng/mL — ABNORMAL HIGH (ref <2.5)
Noroxycodone: NEGATIVE ng/mL (ref <2.5)
Opiates: POSITIVE ng/mL — AB (ref <2.5)
Oxycodone: NEGATIVE ng/mL (ref <2.5)
Oxymorphone: NEGATIVE ng/mL (ref <2.5)
Phencyclidine: NEGATIVE ng/mL (ref <10)
Tapentadol: NEGATIVE ng/mL (ref <5.0)
Tramadol: NEGATIVE ng/mL (ref <5.0)
Tramadol: NEGATIVE ng/mL (ref <5.0)
Zolpidem: NEGATIVE ng/mL (ref <5.0)

## 2018-12-22 LAB — DRUG TOX ALC METAB W/CON, ORAL FLD: Alcohol Metabolite: NEGATIVE ng/mL (ref <25)

## 2018-12-24 ENCOUNTER — Telehealth: Payer: Self-pay | Admitting: *Deleted

## 2018-12-24 NOTE — Telephone Encounter (Signed)
Oral swab drug screen was consistent for prescribed medications.  ?

## 2018-12-31 ENCOUNTER — Encounter: Payer: Self-pay | Admitting: Internal Medicine

## 2019-01-01 ENCOUNTER — Encounter: Payer: Self-pay | Admitting: Internal Medicine

## 2019-01-02 NOTE — Telephone Encounter (Signed)
appt scheduled

## 2019-01-03 NOTE — Progress Notes (Signed)
Subjective:    Patient ID: Hannah Scott, female    DOB: October 10, 1950, 68 y.o.   MRN: [id number]  HPI She is here for follow up of her chronic medical conditions.   She is not exercising regularly.     CAD, Hypertension: She is taking her medication daily. She is compliant with a low sodium diet.  She denies chest pain, palpitations, edema and regular headaches. She does monitor her blood pressure at home- usually her BP is 140-160.    Hypothyroidism:  She is taking her medication daily.  She feels more fatigued, her hair is coming out.   She feels her thyroid may be off.   Asthma:  She is using her inhalers as prscribed.  She follows with pulmonary.    Prediabetes:  She is compliant with a low sugar/carbohydrate diet.  She is not exercising regularly.  Hyperlipidemia: She is taking her medication daily. She is compliant with a low fat/cholesterol diet. She denies myalgias.   Depression: She is taking her medication daily as prescribed. She denies any side effects from the medication. She feels her depression is a little more than usual.  This may be related to COVID.     Medications and allergies reviewed with patient and updated if appropriate.  Patient Active Problem List   Diagnosis Date Noted  . Murmur, cardiac 01/16/2018  . Moderate persistent asthma with exacerbation 12/13/2017  . Osteoporosis 01/07/2017  . Acute bronchitis 10/17/2016  . Prediabetes 07/07/2016  . History of breast cancer 01/06/2016  . Intraductal papilloma of left breast 01/06/2016  . Essential hypertension 06/08/2015  . DOE (dyspnea on exertion) 06/08/2015  . Fatigue 05/20/2015  . Back pain 04/22/2015  . Anemia due to blood loss 11/27/2013  . Elevated CPK   . Multinodular goiter 12/01/2011  . Asthma   . Hypothyroidism   . CAD (coronary artery disease)   . Atrial septal aneurysm   . Hyperlipidemia   . Scoliosis   . Spinal stenosis, lumbar region, with neurogenic claudication 07/01/2011  .  Lumbar postlaminectomy syndrome 07/01/2011  . Osteoarthritis of right hip 07/01/2011  . Contracture of right hip 07/01/2011  . Endometriosis   . Depression 12/24/2009  . Migraine headache 05/19/2006    Current Outpatient Medications on File Prior to Visit  Medication Sig Dispense Refill  . albuterol (PROVENTIL HFA;VENTOLIN HFA) 108 (90 Base) MCG/ACT inhaler Inhale 2 puffs into the lungs every 4 (four) hours as needed for wheezing or shortness of breath. 1 Inhaler 0  . atorvastatin (LIPITOR) 20 MG tablet TAKE 1 TABLET BY MOUTH DAILY 90 tablet 3  . benazepril (LOTENSIN) 40 MG tablet TAKE 1 TABLET BY MOUTH DAILY 90 tablet 2  . DULoxetine (CYMBALTA) 60 MG capsule TAKE ONE CAPSULE EVERY DAY 90 capsule 1  . fluticasone furoate-vilanterol (BREO ELLIPTA) 100-25 MCG/INH AEPB Inhale 1 puff into the lungs daily. 1 each 11  . gabapentin (NEURONTIN) 600 MG tablet Take 1 tablet (600 mg total) by mouth at bedtime. 30 tablet 5  . HYDROcodone-acetaminophen (NORCO) 10-325 MG tablet Take 1 tablet by mouth every 8 (eight) hours as needed. Do Not Fill Before 12/27/2018 100 tablet 0  . ipratropium-albuterol (DUONEB) 0.5-2.5 (3) MG/3ML SOLN Take 3 mLs by nebulization every 4 (four) hours as needed (wheezing or SOB). During exacerbation 360 mL 1  . levothyroxine (SYNTHROID) 75 MCG tablet Take 1 tablet (75 mcg total) by mouth daily. Annual appt due in Sept must see provider for future refills 90 tablet 0  .  metoprolol tartrate (LOPRESSOR) 50 MG tablet TAKE 1& 1/2 TABLET BY MOUTH TWICE DAILY 270 tablet 1   No current facility-administered medications on file prior to visit.     Past Medical History:  Diagnosis Date  . Anemia    hx  . Arthritis   . Asthma    PFTs, February, 2011, moderate obstructive disease with response to bronchodilators, normal lung volumes, moderate reduction in diffusing capacity  . Atrial septal aneurysm    Echo, 2008-not noted on 13 echo  . CAD (coronary artery disease)    90% distal  LAD in the past  /   nuclear, 2008, no ischemia, ejection fraction 70%  . Cancer ([place]) 2002   DUCTAL CIS--S/P LUMPECTOMY, RADIATION AND 6 WEEKS OF TAMOXIFEN  . D-dimer, elevated    January, 2014  . Depression   . Ejection fraction    EF 60%, echo, October, 2008  . Elevated CPK    January, 2014  . Endometriosis 1989   RIGHT TUBE  . Endometriosis 1987   LEFT TUBE/OVARY W FOCAL IN-SITU ENDOMETRIAL ADENOCARCINOMA  . GERD (gastroesophageal reflux disease)    occ  . H/O hiatal hernia    ?  [name] Hyperlipidemia   . Hypertension   . Hypothyroidism    Patient has had in the past that she does not need treatment  . Kyphoscoliosis   . Obstructive airway disease ([place])   . Pinched nerve    lower back  . Shortness of breath   . UTI (lower urinary tract infection)     Past Surgical History:  Procedure Laterality Date  . ANKLE SURGERY Left    ligament  . APPENDECTOMY  1987   AT [facility]  . BACK SURGERY     Fusion  . BREAST LUMPECTOMY  2003   radiation on right  . BREAST LUMPECTOMY WITH RADIOACTIVE SEED LOCALIZATION Left 02/12/2016   Procedure: LEFT BREAST LUMPECTOMY WITH RADIOACTIVE SEED LOCALIZATION;  Surgeon: Excell Seltzer, MD;  Location: [facility];  Service: General;  Laterality: Left;  . CARDIOVASCULAR STRESS TEST  09/07/05   Nuclear, was negative  . CATARACT EXTRACTION Bilateral 01,03  . OOPHORECTOMY     LSO -RSO  . PELVIC LAPAROSCOPY  1989   RSO, LYSIS OF ADHESIONS  . TOTAL ABDOMINAL HYSTERECTOMY  1987   LSO, APPENDECTOMY  . TOTAL HIP ARTHROPLASTY Right 10/28/2013   dr Lorin Mercy  . TOTAL HIP ARTHROPLASTY Right 10/28/2013   Procedure: TOTAL HIP ARTHROPLASTY ANTERIOR APPROACH;  Surgeon: Marybelle Killings, MD;  Location: [facility];  Service: Orthopedics;  Laterality: Right;  Right Total Hip Arthroplasty, Direct Anterior Approach    Social History   Socioeconomic History  . Marital status: Widowed    Spouse name: Not on file  . Number of children: Not on file  . Years of  education: Not on file  . Highest education level: Not on file  Occupational History  . Not on file  Social Needs  . Financial resource strain: Not on file  . Food insecurity    Worry: Not on file    Inability: Not on file  . Transportation needs    Medical: Not on file    Non-medical: Not on file  Tobacco Use  . Smoking status: Never Smoker  . Smokeless tobacco: Never Used  Substance and Sexual Activity  . Alcohol use: No    Alcohol/week: 0.0 standard drinks  . Drug use: No  . Sexual activity: Never    Birth control/protection: Surgical  Lifestyle  . Physical activity    Days per week: Not on file    Minutes per session: Not on file  . Stress: Not on file  Relationships  . Social Herbalist on phone: Not on file    Gets together: Not on file    Attends religious service: Not on file    Active member of club or organization: Not on file    Attends meetings of clubs or organizations: Not on file    Relationship status: Not on file  Other Topics Concern  . Not on file  Social History Narrative  . Not on file    Family History  Problem Relation Age of Onset  . Heart failure Mother   . Pneumonia Mother   . Hypertension Mother   . Heart disease Mother   . Stroke Maternal Grandfather   . Hypertension Maternal Grandfather   . Heart attack Father   . Heart disease Father   . Asthma Paternal Uncle        PAT UNCLES  . Heart attack Paternal Uncle     Review of Systems  Constitutional: Positive for fatigue. Negative for chills and fever.  Respiratory: Positive for shortness of breath. Negative for cough and wheezing.   Cardiovascular: Negative for chest pain, palpitations and leg swelling.  Neurological: Negative for light-headedness and headaches.  Psychiatric/Behavioral: Positive for dysphoric mood (little worse) and sleep disturbance.       Objective:   Vitals:   01/04/19 0930  BP: (!) 168/82  Pulse: 73  Resp: 16  Temp: 98.7 F (37.1 C)   SpO2: 90%   BP Readings from Last 3 Encounters:  01/04/19 (!) 168/82  12/18/18 (!) 147/83  11/08/18 (!) 166/98   Wt Readings from Last 3 Encounters:  01/04/19 169 lb 1.9 oz (76.7 kg)  12/18/18 168 lb (76.2 kg)  11/08/18 166 lb (75.3 kg)   Body mass index is 33.03 kg/m.   Physical Exam    Constitutional: Appears well-developed and well-nourished. No distress.  HENT:  Head: Normocephalic and atraumatic.  Neck: Neck supple. No tracheal deviation present. No thyromegaly present.  No cervical lymphadenopathy Cardiovascular: Normal rate, regular rhythm and normal heart sounds.   2/6 systolic murmur heard. No carotid bruit .  No edema Pulmonary/Chest: Effort normal and breath sounds normal. No respiratory distress. No has no wheezes. No rales.  Musculoskeletal: Scoliosis Skin: Skin is warm and dry. Not diaphoretic.  Psychiatric: Depressed mood and affect. Behavior is normal.      Assessment & Plan:    See Problem List for Assessment and Plan of chronic medical problems.

## 2019-01-03 NOTE — Patient Instructions (Addendum)
  Tests ordered today. Your results will be released to [place] (or called to you) after review.  If any changes need to be made, you will be notified at that same time.   Flu immunizations administered today.   Medications reviewed and updated.  Changes include :   none   Please followup in 6 months

## 2019-01-04 ENCOUNTER — Ambulatory Visit: Payer: Medicare Other

## 2019-01-04 ENCOUNTER — Ambulatory Visit (INDEPENDENT_AMBULATORY_CARE_PROVIDER_SITE_OTHER): Payer: Medicare Other | Admitting: Internal Medicine

## 2019-01-04 ENCOUNTER — Other Ambulatory Visit (INDEPENDENT_AMBULATORY_CARE_PROVIDER_SITE_OTHER): Payer: Medicare Other

## 2019-01-04 ENCOUNTER — Other Ambulatory Visit: Payer: Self-pay

## 2019-01-04 ENCOUNTER — Encounter: Payer: Self-pay | Admitting: Internal Medicine

## 2019-01-04 VITALS — BP 168/82 | HR 73 | Temp 98.7°F | Resp 16 | Ht 60.0 in | Wt 169.1 lb

## 2019-01-04 DIAGNOSIS — I2510 Atherosclerotic heart disease of native coronary artery without angina pectoris: Secondary | ICD-10-CM

## 2019-01-04 DIAGNOSIS — E7849 Other hyperlipidemia: Secondary | ICD-10-CM

## 2019-01-04 DIAGNOSIS — F3289 Other specified depressive episodes: Secondary | ICD-10-CM

## 2019-01-04 DIAGNOSIS — E039 Hypothyroidism, unspecified: Secondary | ICD-10-CM | POA: Diagnosis not present

## 2019-01-04 DIAGNOSIS — R5383 Other fatigue: Secondary | ICD-10-CM

## 2019-01-04 DIAGNOSIS — Z23 Encounter for immunization: Secondary | ICD-10-CM | POA: Diagnosis not present

## 2019-01-04 DIAGNOSIS — J4530 Mild persistent asthma, uncomplicated: Secondary | ICD-10-CM | POA: Diagnosis not present

## 2019-01-04 DIAGNOSIS — I10 Essential (primary) hypertension: Secondary | ICD-10-CM | POA: Diagnosis not present

## 2019-01-04 DIAGNOSIS — M810 Age-related osteoporosis without current pathological fracture: Secondary | ICD-10-CM

## 2019-01-04 DIAGNOSIS — R7303 Prediabetes: Secondary | ICD-10-CM

## 2019-01-04 DIAGNOSIS — E782 Mixed hyperlipidemia: Secondary | ICD-10-CM

## 2019-01-04 LAB — CBC WITH DIFFERENTIAL/PLATELET
Basophils Absolute: 0 10*3/uL (ref 0.0–0.1)
Basophils Relative: 0.8 % (ref 0.0–3.0)
Eosinophils Absolute: 0.6 10*3/uL (ref 0.0–0.7)
Eosinophils Relative: 11.2 % — ABNORMAL HIGH (ref 0.0–5.0)
HCT: 39.1 % (ref 36.0–46.0)
Hemoglobin: 12.5 g/dL (ref 12.0–15.0)
Lymphocytes Relative: 19 % (ref 12.0–46.0)
Lymphs Abs: 1 10*3/uL (ref 0.7–4.0)
MCHC: 31.9 g/dL (ref 30.0–36.0)
MCV: 86 fl (ref 78.0–100.0)
Monocytes Absolute: 0.5 10*3/uL (ref 0.1–1.0)
Monocytes Relative: 9.2 % (ref 3.0–12.0)
Neutro Abs: 3.3 10*3/uL (ref 1.4–7.7)
Neutrophils Relative %: 59.8 % (ref 43.0–77.0)
Platelets: 230 10*3/uL (ref 150.0–400.0)
RBC: 4.55 Mil/uL (ref 3.87–5.11)
RDW: 15.4 % (ref 11.5–15.5)
WBC: 5.5 10*3/uL (ref 4.0–10.5)

## 2019-01-04 LAB — LIPID PANEL
Cholesterol: 126 mg/dL (ref 0–200)
HDL: 53.1 mg/dL (ref >39.00)
LDL Cholesterol: 57 mg/dL (ref 0–99)
NonHDL: 73.24
Total CHOL/HDL Ratio: 2
Triglycerides: 83 mg/dL (ref 0.0–149.0)
VLDL: 16.6 mg/dL (ref 0.0–40.0)

## 2019-01-04 LAB — COMPREHENSIVE METABOLIC PANEL
ALT: 18 U/L (ref 0–35)
AST: 23 U/L (ref 0–37)
Albumin: 3.9 g/dL (ref 3.5–5.2)
Alkaline Phosphatase: 88 U/L (ref 39–117)
BUN: 10 mg/dL (ref 6–23)
CO2: 35 mEq/L — ABNORMAL HIGH (ref 19–32)
Calcium: 9.1 mg/dL (ref 8.4–10.5)
Chloride: 102 mEq/L (ref 96–112)
Creatinine, Ser: 0.61 mg/dL (ref 0.40–1.20)
GFR: 97.13 mL/min (ref >60.00)
Glucose, Bld: 88 mg/dL (ref 70–99)
Potassium: 4.8 mEq/L (ref 3.5–5.1)
Sodium: 141 mEq/L (ref 135–145)
Total Bilirubin: 0.6 mg/dL (ref 0.2–1.2)
Total Protein: 6 g/dL (ref 6.0–8.3)

## 2019-01-04 LAB — HEMOGLOBIN A1C: Hgb A1c MFr Bld: 6.1 % (ref 4.6–6.5)

## 2019-01-04 LAB — TSH: TSH: 1.45 u[IU]/mL (ref 0.35–4.50)

## 2019-01-04 MED ORDER — METOPROLOL TARTRATE 50 MG PO TABS: 100.0000 mg | ORAL_TABLET | Freq: Two times a day (BID) | ORAL | 1 refills | Status: DC

## 2019-01-04 NOTE — Assessment & Plan Note (Signed)
Check a1c Low sugar / carb diet Stressed regular exercise   

## 2019-01-04 NOTE — Assessment & Plan Note (Signed)
Check lipid panel  Continue daily statin Regular exercise and healthy diet encouraged  

## 2019-01-04 NOTE — Assessment & Plan Note (Signed)
She is experiencing increased fatigue and she feels this is likely related to her thyroid being off Will check TSH and titrate medication dose if needed Check CBC, CMP Discussed that this may also be related to underlying depression and she will consider adjusting her depressant medication

## 2019-01-04 NOTE — Assessment & Plan Note (Signed)
BP elevated and has been elevated Increase metoprolol to 100 mg twice daily - if not effective will try hydralazine cmp

## 2019-01-04 NOTE — Assessment & Plan Note (Signed)
Experiencing symptoms suggestive of hypothyroidism Check TSH We will titrate medication if needed

## 2019-01-04 NOTE — Assessment & Plan Note (Signed)
No chest pain or palpitations Continue current medications Cmp, cbc, tsh

## 2019-01-04 NOTE — Assessment & Plan Note (Signed)
She is taking her antidepressant daily.  She states her depression may be a little bit more than usual Discussed that this could be contributing to her fatigue Advised her to think if she would like to increase the Cymbalta or add Wellbutrin to her Cymbalta-she will think about this and let me know

## 2019-01-05 ENCOUNTER — Encounter: Payer: Self-pay | Admitting: Internal Medicine

## 2019-01-23 ENCOUNTER — Encounter: Payer: Self-pay | Admitting: Registered Nurse

## 2019-01-23 ENCOUNTER — Encounter: Payer: Medicare Other | Attending: Physical Medicine & Rehabilitation | Admitting: Registered Nurse

## 2019-01-23 ENCOUNTER — Other Ambulatory Visit: Payer: Self-pay

## 2019-01-23 VITALS — BP 157/86 | HR 65 | Temp 97.7°F | Ht 60.0 in | Wt 170.0 lb

## 2019-01-23 DIAGNOSIS — M1611 Unilateral primary osteoarthritis, right hip: Secondary | ICD-10-CM | POA: Insufficient documentation

## 2019-01-23 DIAGNOSIS — M7061 Trochanteric bursitis, right hip: Secondary | ICD-10-CM | POA: Diagnosis not present

## 2019-01-23 DIAGNOSIS — M24551 Contracture, right hip: Secondary | ICD-10-CM | POA: Diagnosis not present

## 2019-01-23 DIAGNOSIS — M48061 Spinal stenosis, lumbar region without neurogenic claudication: Secondary | ICD-10-CM | POA: Insufficient documentation

## 2019-01-23 DIAGNOSIS — M4126 Other idiopathic scoliosis, lumbar region: Secondary | ICD-10-CM | POA: Diagnosis not present

## 2019-01-23 DIAGNOSIS — Z79891 Long term (current) use of opiate analgesic: Secondary | ICD-10-CM

## 2019-01-23 DIAGNOSIS — M961 Postlaminectomy syndrome, not elsewhere classified: Secondary | ICD-10-CM

## 2019-01-23 DIAGNOSIS — Z5181 Encounter for therapeutic drug level monitoring: Secondary | ICD-10-CM

## 2019-01-23 DIAGNOSIS — M48062 Spinal stenosis, lumbar region with neurogenic claudication: Secondary | ICD-10-CM

## 2019-01-23 DIAGNOSIS — G894 Chronic pain syndrome: Secondary | ICD-10-CM

## 2019-01-23 MED ORDER — HYDROCODONE-ACETAMINOPHEN 10-325 MG PO TABS: 1.0000 | ORAL_TABLET | Freq: Three times a day (TID) | ORAL | 0 refills | Status: DC | PRN

## 2019-01-23 NOTE — Progress Notes (Signed)
Subjective:    Patient ID: Hannah Scott, female    DOB: 07-18-1950, 68 y.o.   MRN: [id number]  HPI: Hannah Scott is a 68 y.o. female who returns for follow up appointment for chronic pain and medication refill. She states her pain is located in her lower back pain.She rates her pain 7. Her current exercise regime is walking with her walker.  Ms. Zwilling Morphine equivalent is 33.33 MME.  Last Oral Swab was Performed on 12/18/2018, it was consistent.    Pain Inventory Average Pain 7 Pain Right Now 7 My pain is constant, sharp, tingling and aching  In the last 24 hours, has pain interfered with the following? General activity 10 Relation with others 10 Enjoyment of life 10 What TIME of day is your pain at its worst? all Sleep (in general) Poor  Pain is worse with: walking and standing Pain improves with: rest and medication Relief from Meds: 6  Mobility walk with assistance use a cane use a walker ability to climb steps?  yes do you drive?  yes  Function disabled: date disabled .  Neuro/Psych weakness numbness tingling trouble walking spasms depression  Prior Studies Any changes since last visit?  no  Physicians involved in your care Any changes since last visit?  no   Family History  Problem Relation Age of Onset  . Heart failure Mother   . Pneumonia Mother   . Hypertension Mother   . Heart disease Mother   . Stroke Maternal Grandfather   . Hypertension Maternal Grandfather   . Heart attack Father   . Heart disease Father   . Asthma Paternal Uncle        PAT UNCLES  . Heart attack Paternal Uncle    Social History   Socioeconomic History  . Marital status: Widowed    Spouse name: Not on file  . Number of children: Not on file  . Years of education: Not on file  . Highest education level: Not on file  Occupational History  . Not on file  Social Needs  . Financial resource strain: Not on file  . Food insecurity    Worry: Not on file    Inability:  Not on file  . Transportation needs    Medical: Not on file    Non-medical: Not on file  Tobacco Use  . Smoking status: Never Smoker  . Smokeless tobacco: Never Used  Substance and Sexual Activity  . Alcohol use: No    Alcohol/week: 0.0 standard drinks  . Drug use: No  . Sexual activity: Never    Birth control/protection: Surgical  Lifestyle  . Physical activity    Days per week: Not on file    Minutes per session: Not on file  . Stress: Not on file  Relationships  . Social Herbalist on phone: Not on file    Gets together: Not on file    Attends religious service: Not on file    Active member of club or organization: Not on file    Attends meetings of clubs or organizations: Not on file    Relationship status: Not on file  Other Topics Concern  . Not on file  Social History Narrative  . Not on file   Past Surgical History:  Procedure Laterality Date  . ANKLE SURGERY Left    ligament  . APPENDECTOMY  1987   AT [facility]  . BACK SURGERY     Fusion  . BREAST  LUMPECTOMY  2003   radiation on right  . BREAST LUMPECTOMY WITH RADIOACTIVE SEED LOCALIZATION Left 02/12/2016   Procedure: LEFT BREAST LUMPECTOMY WITH RADIOACTIVE SEED LOCALIZATION;  Surgeon: Excell Seltzer, MD;  Location: [facility];  Service: General;  Laterality: Left;  . CARDIOVASCULAR STRESS TEST  09/07/05   Nuclear, was negative  . CATARACT EXTRACTION Bilateral 01,03  . OOPHORECTOMY     LSO -RSO  . PELVIC LAPAROSCOPY  1989   RSO, LYSIS OF ADHESIONS  . TOTAL ABDOMINAL HYSTERECTOMY  1987   LSO, APPENDECTOMY  . TOTAL HIP ARTHROPLASTY Right 10/28/2013   dr Lorin Mercy  . TOTAL HIP ARTHROPLASTY Right 10/28/2013   Procedure: TOTAL HIP ARTHROPLASTY ANTERIOR APPROACH;  Surgeon: Marybelle Killings, MD;  Location: [facility];  Service: Orthopedics;  Laterality: Right;  Right Total Hip Arthroplasty, Direct Anterior Approach   Past Medical History:  Diagnosis Date  . Anemia    hx  . Arthritis   . Asthma     PFTs, February, 2011, moderate obstructive disease with response to bronchodilators, normal lung volumes, moderate reduction in diffusing capacity  . Atrial septal aneurysm    Echo, 2008-not noted on 13 echo  . CAD (coronary artery disease)    90% distal LAD in the past  /   nuclear, 2008, no ischemia, ejection fraction 70%  . Cancer ([place]) 2002   DUCTAL CIS--S/P LUMPECTOMY, RADIATION AND 6 WEEKS OF TAMOXIFEN  . D-dimer, elevated    January, 2014  . Depression   . Ejection fraction    EF 60%, echo, October, 2008  . Elevated CPK    January, 2014  . Endometriosis 1989   RIGHT TUBE  . Endometriosis 1987   LEFT TUBE/OVARY W FOCAL IN-SITU ENDOMETRIAL ADENOCARCINOMA  . GERD (gastroesophageal reflux disease)    occ  . H/O hiatal hernia    ?  [name] Hyperlipidemia   . Hypertension   . Hypothyroidism    Patient has had in the past that she does not need treatment  . Kyphoscoliosis   . Obstructive airway disease ([place])   . Pinched nerve    lower back  . Shortness of breath   . UTI (lower urinary tract infection)    BP (!) 162/74   Pulse 66   Temp 97.7 F (36.5 C)   Ht 5' (1.524 m)   Wt 170 lb (77.1 kg)   SpO2 92%   BMI 33.20 kg/m   Opioid Risk Score:   Fall Risk Score:  `1  Depression screen PHQ 2/9  Depression screen [facility] 2/9 09/11/2018 05/16/2018 01/17/2018 06/02/2017 05/03/2017 01/27/2017 01/10/2017  Decreased Interest 2 1 1 1 1 1  0  Down, Depressed, Hopeless 2 1 1 1 1 1 2   PHQ - 2 Score 4 2 2 2 2 2 2   Altered sleeping - - - - - - 1  Tired, decreased energy - - - - - - 1  Change in appetite - - - - - - 0  Feeling bad or failure about yourself  - - - - - - 1  Trouble concentrating - - - - - - 0  Moving slowly or fidgety/restless - - - - - - 0  Suicidal thoughts - - - - - - 0  PHQ-9 Score - - - - - - 5  Difficult doing work/chores - - - - - - Not difficult at all  Some recent data might be hidden     Review of Systems  Constitutional: Negative.  HENT: Negative.   Eyes:  Negative.   Respiratory: Negative.   Cardiovascular: Negative.   Gastrointestinal: Negative.   Endocrine: Negative.   Genitourinary: Negative.   Musculoskeletal: Positive for back pain and gait problem.       Spasms   Skin: Negative.   Allergic/Immunologic: Negative.   Neurological: Positive for weakness and numbness.  Hematological: Negative.   Psychiatric/Behavioral: Positive for dysphoric mood.  All other systems reviewed and are negative.      Objective:   Physical Exam Vitals signs and nursing note reviewed.  Constitutional:      Appearance: Normal appearance.  Neck:     Musculoskeletal: Normal range of motion and neck supple.  Cardiovascular:     Rate and Rhythm: Normal rate and regular rhythm.     Pulses: Normal pulses.     Heart sounds: Normal heart sounds.  Pulmonary:     Effort: Pulmonary effort is normal.     Breath sounds: Normal breath sounds.  Musculoskeletal:     Comments: Normal Muscle Bulk and Muscle Testing Reveals:  Upper Extremities: Full ROM and Muscle Strength 5/5  Lumbar Paraspinal Tenderness: L-3-L-5 Right Greater Trochanter Tenderness  Lower Extremities: Full ROM and Muscle Strength 5/5 Arises from Chair slowly using walker for support Antalgic Gait   Skin:    General: Skin is warm and dry.  Neurological:     Mental Status: She is alert and oriented to person, place, and time.  Psychiatric:        Mood and Affect: Mood normal.        Behavior: Behavior normal.           Assessment & Plan:  1.Lumbar post laminectomy syndromewith severe kyphoscoliosis thoracolumbar spine, s/p lumbar fusion/ 01/23/2019 Continue current medication regimen.Refilled: Hydrocodone 10/325mg  one tablet every8hours may take an extra tablet when pain is severe#100. We will continue the opioid monitoring program, this consists of regular clinic visits, examinations, urine drug screen, pill counts as well as use of [place] Controlled Substance Reporting  system. 2. Right Hip OA: S/P Right Hip Replacement 10/28/2013.01/23/2019 3.Depression: Continue Cymbalta. Has Family Support and Friends. Counseling with Doristine Bosworth. 01/23/2019 4.Right Greater Trochanteric Tenderness:Continue to Monitor. Continue with Ice and Heat Therapy.01/23/2019  38minutes of face to face patient care time was spent during this visit. All questions were encouraged and answered.  F/U in 1 month

## 2019-02-20 ENCOUNTER — Other Ambulatory Visit: Payer: Self-pay

## 2019-02-22 ENCOUNTER — Encounter: Payer: Medicare Other | Admitting: Registered Nurse

## 2019-02-27 ENCOUNTER — Ambulatory Visit: Payer: Medicare Other | Admitting: Registered Nurse

## 2019-02-28 ENCOUNTER — Encounter: Payer: Self-pay | Admitting: Internal Medicine

## 2019-03-01 ENCOUNTER — Other Ambulatory Visit: Payer: Self-pay

## 2019-03-01 MED ORDER — IPRATROPIUM-ALBUTEROL 0.5-2.5 (3) MG/3ML IN SOLN: 3.0000 mL | RESPIRATORY_TRACT | 1 refills | Status: DC | PRN

## 2019-03-05 ENCOUNTER — Other Ambulatory Visit: Payer: Self-pay

## 2019-03-05 ENCOUNTER — Encounter: Payer: Medicare Other | Attending: Physical Medicine & Rehabilitation | Admitting: Registered Nurse

## 2019-03-05 ENCOUNTER — Encounter: Payer: Self-pay | Admitting: Registered Nurse

## 2019-03-05 VITALS — BP 158/93 | HR 86 | Temp 98.6°F | Ht 60.0 in | Wt 170.0 lb

## 2019-03-05 DIAGNOSIS — M48062 Spinal stenosis, lumbar region with neurogenic claudication: Secondary | ICD-10-CM | POA: Diagnosis not present

## 2019-03-05 DIAGNOSIS — M48061 Spinal stenosis, lumbar region without neurogenic claudication: Secondary | ICD-10-CM | POA: Insufficient documentation

## 2019-03-05 DIAGNOSIS — Z5181 Encounter for therapeutic drug level monitoring: Secondary | ICD-10-CM

## 2019-03-05 DIAGNOSIS — M24551 Contracture, right hip: Secondary | ICD-10-CM | POA: Diagnosis not present

## 2019-03-05 DIAGNOSIS — M961 Postlaminectomy syndrome, not elsewhere classified: Secondary | ICD-10-CM | POA: Insufficient documentation

## 2019-03-05 DIAGNOSIS — M419 Scoliosis, unspecified: Secondary | ICD-10-CM

## 2019-03-05 DIAGNOSIS — M4126 Other idiopathic scoliosis, lumbar region: Secondary | ICD-10-CM | POA: Insufficient documentation

## 2019-03-05 DIAGNOSIS — M1611 Unilateral primary osteoarthritis, right hip: Secondary | ICD-10-CM | POA: Diagnosis not present

## 2019-03-05 DIAGNOSIS — M7061 Trochanteric bursitis, right hip: Secondary | ICD-10-CM | POA: Diagnosis not present

## 2019-03-05 DIAGNOSIS — Z79891 Long term (current) use of opiate analgesic: Secondary | ICD-10-CM

## 2019-03-05 DIAGNOSIS — G894 Chronic pain syndrome: Secondary | ICD-10-CM

## 2019-03-05 MED ORDER — HYDROCODONE-ACETAMINOPHEN 10-325 MG PO TABS: 1.0000 | ORAL_TABLET | Freq: Three times a day (TID) | ORAL | 0 refills | Status: DC | PRN

## 2019-03-05 NOTE — Progress Notes (Signed)
Subjective:    Patient ID: Hannah Scott, female    DOB: 09-22-1950, 68 y.o.   MRN: [id number]  HPI: MIU SANDEFER is a 68 y.o. female who returns for follow up appointment for chronic pain and medication refill. She states her pain is located in her lower back and right hip pain.She rates her pain 7. Her  current exercise regime is walking with her walker or cane.   Ms. Schmalzried Morphine equivalent is 33.33  MME.  Last Oral Swab was Performed on 12/18/2018, it was consistent.    Pain Inventory Average Pain 7 Pain Right Now 7 My pain is constant, burning, tingling and aching  In the last 24 hours, has pain interfered with the following? General activity 10 Relation with others 10 Enjoyment of life 10 What TIME of day is your pain at its worst? all Sleep (in general) Poor  Pain is worse with: walking and standing Pain improves with: medication Relief from Meds: 4  Mobility walk with assistance use a cane use a walker ability to climb steps?  yes do you drive?  yes  Function disabled: date disabled .  Neuro/Psych weakness numbness tingling trouble walking depression  Prior Studies Any changes since last visit?  no  Physicians involved in your care Any changes since last visit?  no   Family History  Problem Relation Age of Onset  . Heart failure Mother   . Pneumonia Mother   . Hypertension Mother   . Heart disease Mother   . Stroke Maternal Grandfather   . Hypertension Maternal Grandfather   . Heart attack Father   . Heart disease Father   . Asthma Paternal Uncle        PAT UNCLES  . Heart attack Paternal Uncle    Social History   Socioeconomic History  . Marital status: Widowed    Spouse name: Not on file  . Number of children: Not on file  . Years of education: Not on file  . Highest education level: Not on file  Occupational History  . Not on file  Social Needs  . Financial resource strain: Not on file  . Food insecurity    Worry: Not on file   Inability: Not on file  . Transportation needs    Medical: Not on file    Non-medical: Not on file  Tobacco Use  . Smoking status: Never Smoker  . Smokeless tobacco: Never Used  Substance and Sexual Activity  . Alcohol use: No    Alcohol/week: 0.0 standard drinks  . Drug use: No  . Sexual activity: Never    Birth control/protection: Surgical  Lifestyle  . Physical activity    Days per week: Not on file    Minutes per session: Not on file  . Stress: Not on file  Relationships  . Social Herbalist on phone: Not on file    Gets together: Not on file    Attends religious service: Not on file    Active member of club or organization: Not on file    Attends meetings of clubs or organizations: Not on file    Relationship status: Not on file  Other Topics Concern  . Not on file  Social History Narrative  . Not on file   Past Surgical History:  Procedure Laterality Date  . ANKLE SURGERY Left    ligament  . APPENDECTOMY  1987   AT [facility]  . BACK SURGERY     Fusion  .  BREAST LUMPECTOMY  2003   radiation on right  . BREAST LUMPECTOMY WITH RADIOACTIVE SEED LOCALIZATION Left 02/12/2016   Procedure: LEFT BREAST LUMPECTOMY WITH RADIOACTIVE SEED LOCALIZATION;  Surgeon: Excell Seltzer, MD;  Location: [facility];  Service: General;  Laterality: Left;  . CARDIOVASCULAR STRESS TEST  09/07/05   Nuclear, was negative  . CATARACT EXTRACTION Bilateral 01,03  . OOPHORECTOMY     LSO -RSO  . PELVIC LAPAROSCOPY  1989   RSO, LYSIS OF ADHESIONS  . TOTAL ABDOMINAL HYSTERECTOMY  1987   LSO, APPENDECTOMY  . TOTAL HIP ARTHROPLASTY Right 10/28/2013   dr Lorin Mercy  . TOTAL HIP ARTHROPLASTY Right 10/28/2013   Procedure: TOTAL HIP ARTHROPLASTY ANTERIOR APPROACH;  Surgeon: Marybelle Killings, MD;  Location: [place];  Service: Orthopedics;  Laterality: Right;  Right Total Hip Arthroplasty, Direct Anterior Approach   Past Medical History:  Diagnosis Date  . Anemia    hx  . Arthritis   .  Asthma    PFTs, February, 2011, moderate obstructive disease with response to bronchodilators, normal lung volumes, moderate reduction in diffusing capacity  . Atrial septal aneurysm    Echo, 2008-not noted on 13 echo  . CAD (coronary artery disease)    90% distal LAD in the past  /   nuclear, 2008, no ischemia, ejection fraction 70%  . Cancer ([place]) 2002   DUCTAL CIS--S/P LUMPECTOMY, RADIATION AND 6 WEEKS OF TAMOXIFEN  . D-dimer, elevated    January, 2014  . Depression   . Ejection fraction    EF 60%, echo, October, 2008  . Elevated CPK    January, 2014  . Endometriosis 1989   RIGHT TUBE  . Endometriosis 1987   LEFT TUBE/OVARY W FOCAL IN-SITU ENDOMETRIAL ADENOCARCINOMA  . GERD (gastroesophageal reflux disease)    occ  . H/O hiatal hernia    ?  [name] Hyperlipidemia   . Hypertension   . Hypothyroidism    Patient has had in the past that she does not need treatment  . Kyphoscoliosis   . Obstructive airway disease ([place])   . Pinched nerve    lower back  . Shortness of breath   . UTI (lower urinary tract infection)    BP (!) 160/87   Pulse 68   Temp 98.6 F (37 C) (Oral)   Ht 5' (1.524 m)   Wt 170 lb (77.1 kg)   SpO2 94%   BMI 33.20 kg/m   Opioid Risk Score:   Fall Risk Score:  `1  Depression screen PHQ 2/9  Depression screen [facility] 2/9 09/11/2018 05/16/2018 01/17/2018 06/02/2017 05/03/2017 01/27/2017 01/10/2017  Decreased Interest 2 1 1 1 1 1  0  Down, Depressed, Hopeless 2 1 1 1 1 1 2   PHQ - 2 Score 4 2 2 2 2 2 2   Altered sleeping - - - - - - 1  Tired, decreased energy - - - - - - 1  Change in appetite - - - - - - 0  Feeling bad or failure about yourself  - - - - - - 1  Trouble concentrating - - - - - - 0  Moving slowly or fidgety/restless - - - - - - 0  Suicidal thoughts - - - - - - 0  PHQ-9 Score - - - - - - 5  Difficult doing work/chores - - - - - - Not difficult at all  Some recent data might be hidden    Review of Systems  Constitutional: Negative.  HENT: Negative.    Eyes: Negative.   Respiratory: Negative.   Cardiovascular: Negative.   Gastrointestinal: Negative.   Endocrine: Negative.   Genitourinary: Negative.   Musculoskeletal: Positive for arthralgias, back pain and gait problem.  Skin: Negative.   Allergic/Immunologic: Negative.   Neurological: Positive for weakness and numbness.       Tingling  All other systems reviewed and are negative.      Objective:   Physical Exam Vitals signs and nursing note reviewed.  Constitutional:      Appearance: Normal appearance.  HENT:     Head: Normocephalic and atraumatic.  Neck:     Musculoskeletal: Normal range of motion and neck supple.  Cardiovascular:     Rate and Rhythm: Normal rate and regular rhythm.     Pulses: Normal pulses.     Heart sounds: Normal heart sounds.  Pulmonary:     Effort: Pulmonary effort is normal.     Breath sounds: Normal breath sounds.  Musculoskeletal:     Comments: Normal Muscle Bulk and Muscle Testing Reveals:  Upper Extremities: Full ROM and Muscle Strength 5/5 , Lumbar Paraspinal Tenderness: L-3-L-5 Right Greater Trochanteric Tenderness Lower Extremities: Full ROM and Muscle Strength 5/5 Arises from Table slowly using walker for support Narrow Based Gait   Skin:    General: Skin is warm and dry.  Neurological:     Mental Status: She is alert and oriented to person, place, and time.  Psychiatric:        Mood and Affect: Mood normal.        Behavior: Behavior normal.           Assessment & Plan:  1.Lumbar post laminectomy syndromewith severe kyphoscoliosis thoracolumbar spine, s/p lumbar fusion/ 03/05/2019 Continue current medication regimen.Refilled: Hydrocodone 10/325mg  one tablet every8hours may take an extra tablet when pain is severe#100. We will continue the opioid monitoring program, this consists of regular clinic visits, examinations, urine drug screen, pill counts as well as use of [place] Controlled Substance Reporting  system. 2. Right Hip OA: S/P Right Hip Replacement 10/28/2013.03/05/2019 3.Depression: Continue Cymbalta. Has Family Support and Friends. Counseling with Doristine Bosworth. 03/05/2019 4.RightGreater Trochanteric Tenderness:Continue to Monitor. Continue with Ice and Heat Therapy.03/05/2019  37minutes of face to face patient care time was spent during this visit. All questions were encouraged and answered.  F/U in 1 month

## 2019-03-22 ENCOUNTER — Other Ambulatory Visit: Payer: Self-pay | Admitting: Cardiology

## 2019-03-22 DIAGNOSIS — I10 Essential (primary) hypertension: Secondary | ICD-10-CM

## 2019-03-28 ENCOUNTER — Other Ambulatory Visit: Payer: Self-pay | Admitting: Cardiology

## 2019-03-28 DIAGNOSIS — I10 Essential (primary) hypertension: Secondary | ICD-10-CM

## 2019-04-11 ENCOUNTER — Other Ambulatory Visit: Payer: Self-pay | Admitting: Internal Medicine

## 2019-04-11 ENCOUNTER — Encounter: Payer: Medicare Other | Admitting: Registered Nurse

## 2019-04-24 ENCOUNTER — Other Ambulatory Visit: Payer: Self-pay | Admitting: Cardiology

## 2019-04-24 ENCOUNTER — Telehealth: Payer: Self-pay | Admitting: Cardiology

## 2019-04-24 DIAGNOSIS — I10 Essential (primary) hypertension: Secondary | ICD-10-CM

## 2019-04-24 MED ORDER — METOPROLOL TARTRATE 50 MG PO TABS: 100.0000 mg | ORAL_TABLET | Freq: Two times a day (BID) | ORAL | 0 refills | Status: DC

## 2019-04-24 NOTE — Telephone Encounter (Signed)
Spoke with pt advised sending 30 day supply, need office visit scheduled for further refills.  She will call office when she has her calendar available.

## 2019-04-24 NOTE — Telephone Encounter (Signed)
New Message  Pt c/o medication issue:  1. Name of Medication: metoprolol tartrate (LOPRESSOR) 50 MG tablet  2. How are you currently taking this medication (dosage and times per day)? As written  3. Are you having a reaction (difficulty breathing--STAT)? No  4. What is your medication issue? Out of refills; needs a new prescription from Dr. Abbott Pao will be out of medication tomorrow, 04/25/19  Send to [facility]

## 2019-05-17 ENCOUNTER — Encounter: Payer: Medicare Other | Attending: Physical Medicine & Rehabilitation | Admitting: Registered Nurse

## 2019-05-17 ENCOUNTER — Other Ambulatory Visit: Payer: Self-pay

## 2019-05-17 ENCOUNTER — Encounter: Payer: Self-pay | Admitting: Registered Nurse

## 2019-05-17 VITALS — BP 151/85 | Ht 60.0 in | Wt 162.0 lb

## 2019-05-17 DIAGNOSIS — M48061 Spinal stenosis, lumbar region without neurogenic claudication: Secondary | ICD-10-CM | POA: Insufficient documentation

## 2019-05-17 DIAGNOSIS — Z5181 Encounter for therapeutic drug level monitoring: Secondary | ICD-10-CM

## 2019-05-17 DIAGNOSIS — M24551 Contracture, right hip: Secondary | ICD-10-CM | POA: Diagnosis not present

## 2019-05-17 DIAGNOSIS — M1611 Unilateral primary osteoarthritis, right hip: Secondary | ICD-10-CM | POA: Insufficient documentation

## 2019-05-17 DIAGNOSIS — M419 Scoliosis, unspecified: Secondary | ICD-10-CM

## 2019-05-17 DIAGNOSIS — Z79891 Long term (current) use of opiate analgesic: Secondary | ICD-10-CM

## 2019-05-17 DIAGNOSIS — M4126 Other idiopathic scoliosis, lumbar region: Secondary | ICD-10-CM | POA: Insufficient documentation

## 2019-05-17 DIAGNOSIS — M48062 Spinal stenosis, lumbar region with neurogenic claudication: Secondary | ICD-10-CM

## 2019-05-17 DIAGNOSIS — M7061 Trochanteric bursitis, right hip: Secondary | ICD-10-CM | POA: Diagnosis not present

## 2019-05-17 DIAGNOSIS — M961 Postlaminectomy syndrome, not elsewhere classified: Secondary | ICD-10-CM | POA: Diagnosis not present

## 2019-05-17 DIAGNOSIS — G894 Chronic pain syndrome: Secondary | ICD-10-CM

## 2019-05-17 MED ORDER — GABAPENTIN 600 MG PO TABS: 600.0000 mg | ORAL_TABLET | Freq: Every day | ORAL | 5 refills | Status: DC

## 2019-05-17 MED ORDER — HYDROCODONE-ACETAMINOPHEN 10-325 MG PO TABS: 1.0000 | ORAL_TABLET | Freq: Three times a day (TID) | ORAL | 0 refills | Status: DC | PRN

## 2019-05-17 NOTE — Progress Notes (Signed)
Subjective:    Patient ID: Hannah Scott, female    DOB: 1950-12-24, 69 y.o.   MRN: [id number]  HPI: Hannah Scott is a 69 y.o. female whose appointment was changed to a virtual office visit to reduce the risk of exposure to the COVID-19 virus and to help [name] remain healthy and safe. The virtual visit will also provide continuity of care. [name] Done agrees to virtual visit and verbalizes understanding.  She states her  pain is located in her lower back. She rates her pain 6. Her current exercise regime is walking with her walker.   [name] Morphine equivalent is 33.33 MME.  Last Oral Swab was performed on 12/18/2018, it was consistent.    Pain Inventory Average Pain 7 Pain Right Now 6 My pain is constant, burning and aching  In the last 24 hours, has pain interfered with the following? General activity 10 Relation with others 10 Enjoyment of life 10 What TIME of day is your pain at its worst? varies with activity Sleep (in general) Poor  Pain is worse with: walking, bending, standing and some activites Pain improves with: rest and medication Relief from Meds: 5  Mobility walk with assistance use a cane use a walker ability to climb steps?  yes do you drive?  yes  Function disabled: date disabled .  Neuro/Psych weakness numbness tingling trouble walking spasms depression  Prior Studies Any changes since last visit?  no  Physicians involved in your care    Family History  Problem Relation Age of Onset  . Heart failure Mother   . Pneumonia Mother   . Hypertension Mother   . Heart disease Mother   . Stroke Maternal Grandfather   . Hypertension Maternal Grandfather   . Heart attack Father   . Heart disease Father   . Asthma Paternal Uncle        PAT UNCLES  . Heart attack Paternal Uncle    Social History   Socioeconomic History  . Marital status: Widowed    Spouse name: Not on file  . Number of children: Not on file  . Years of education: Not on file    . Highest education level: Not on file  Occupational History  . Not on file  Tobacco Use  . Smoking status: Never Smoker  . Smokeless tobacco: Never Used  Substance and Sexual Activity  . Alcohol use: No    Alcohol/week: 0.0 standard drinks  . Drug use: No  . Sexual activity: Never    Birth control/protection: Surgical  Other Topics Concern  . Not on file  Social History Narrative  . Not on file   Social Determinants of Health   Financial Resource Strain:   . Difficulty of Paying Living Expenses: Not on file  Food Insecurity:   . Worried About Charity fundraiser in the Last Year: Not on file  . Ran Out of Food in the Last Year: Not on file  Transportation Needs:   . Lack of Transportation (Medical): Not on file  . Lack of Transportation (Non-Medical): Not on file  Physical Activity:   . Days of Exercise per Week: Not on file  . Minutes of Exercise per Session: Not on file  Stress:   . Feeling of Stress : Not on file  Social Connections:   . Frequency of Communication with Friends and Family: Not on file  . Frequency of Social Gatherings with Friends and Family: Not on file  . Attends  Religious Services: Not on file  . Active Member of Clubs or Organizations: Not on file  . Attends Archivist Meetings: Not on file  . Marital Status: Not on file   Past Surgical History:  Procedure Laterality Date  . ANKLE SURGERY Left    ligament  . APPENDECTOMY  1987   AT [facility]  . BACK SURGERY     Fusion  . BREAST LUMPECTOMY  2003   radiation on right  . BREAST LUMPECTOMY WITH RADIOACTIVE SEED LOCALIZATION Left 02/12/2016   Procedure: LEFT BREAST LUMPECTOMY WITH RADIOACTIVE SEED LOCALIZATION;  Surgeon: Excell Seltzer, MD;  Location: [place];  Service: General;  Laterality: Left;  . CARDIOVASCULAR STRESS TEST  09/07/05   Nuclear, was negative  . CATARACT EXTRACTION Bilateral 01,03  . OOPHORECTOMY     LSO -RSO  . PELVIC LAPAROSCOPY  1989   RSO,  LYSIS OF ADHESIONS  . TOTAL ABDOMINAL HYSTERECTOMY  1987   LSO, APPENDECTOMY  . TOTAL HIP ARTHROPLASTY Right 10/28/2013   dr Lorin Mercy  . TOTAL HIP ARTHROPLASTY Right 10/28/2013   Procedure: TOTAL HIP ARTHROPLASTY ANTERIOR APPROACH;  Surgeon: Marybelle Killings, MD;  Location: [place];  Service: Orthopedics;  Laterality: Right;  Right Total Hip Arthroplasty, Direct Anterior Approach   Past Medical History:  Diagnosis Date  . Anemia    hx  . Arthritis   . Asthma    PFTs, February, 2011, moderate obstructive disease with response to bronchodilators, normal lung volumes, moderate reduction in diffusing capacity  . Atrial septal aneurysm    Echo, 2008-not noted on 13 echo  . CAD (coronary artery disease)    90% distal LAD in the past  /   nuclear, 2008, no ischemia, ejection fraction 70%  . Cancer ([place]) 2002   DUCTAL CIS--S/P LUMPECTOMY, RADIATION AND 6 WEEKS OF TAMOXIFEN  . D-dimer, elevated    January, 2014  . Depression   . Ejection fraction    EF 60%, echo, October, 2008  . Elevated CPK    January, 2014  . Endometriosis 1989   RIGHT TUBE  . Endometriosis 1987   LEFT TUBE/OVARY W FOCAL IN-SITU ENDOMETRIAL ADENOCARCINOMA  . GERD (gastroesophageal reflux disease)    occ  . H/O hiatal hernia    ?  [name] Hyperlipidemia   . Hypertension   . Hypothyroidism    Patient has had in the past that she does not need treatment  . Kyphoscoliosis   . Obstructive airway disease ([place])   . Pinched nerve    lower back  . Shortness of breath   . UTI (lower urinary tract infection)    There were no vitals taken for this visit.  Opioid Risk Score:   Fall Risk Score:  `1  Depression screen PHQ 2/9  Depression screen [facility] 2/9 09/11/2018 05/16/2018 01/17/2018 06/02/2017 05/03/2017 01/27/2017 01/10/2017  Decreased Interest 2 1 1 1 1 1  0  Down, Depressed, Hopeless 2 1 1 1 1 1 2   PHQ - 2 Score 4 2 2 2 2 2 2   Altered sleeping - - - - - - 1  Tired, decreased energy - - - - - - 1  Change in appetite - - - - - - 0    Feeling bad or failure about yourself  - - - - - - 1  Trouble concentrating - - - - - - 0  Moving slowly or fidgety/restless - - - - - - 0  Suicidal thoughts - - - - - -  0  PHQ-9 Score - - - - - - 5  Difficult doing work/chores - - - - - - Not difficult at all  Some recent data might be hidden    Review of Systems  Constitutional: Negative.   HENT: Negative.   Eyes: Negative.   Respiratory: Positive for shortness of breath and wheezing.   Cardiovascular: Negative.   Gastrointestinal: Negative.   Endocrine: Negative.   Genitourinary: Negative.   Musculoskeletal: Positive for arthralgias, back pain, gait problem and neck pain.  Skin: Negative.   Allergic/Immunologic: Negative.   Neurological: Positive for weakness and numbness.       Tingling  Psychiatric/Behavioral: Positive for dysphoric mood.  All other systems reviewed and are negative.      Objective:   Physical Exam Vitals and nursing note reviewed.  Musculoskeletal:     Comments: No Physical Exam Performed: Virtual Visit           Assessment & Plan:  1.Lumbar post laminectomy syndromewith severe kyphoscoliosis thoracolumbar spine, s/p lumbar fusion/ 05/17/2019 Continue current medication regimen.Refilled: Hydrocodone 10/325mg  one tablet every8hours may take an extra tablet when pain is severe#100. We will continue the opioid monitoring program, this consists of regular clinic visits, examinations, urine drug screen, pill counts as well as use of [place] Controlled Substance Reporting system. 2. Right Hip OA: S/P Right Hip Replacement 10/28/2013.05/17/2019 3.Depression: Continue Cymbalta. Has Family Support and Friends. Counseling with Doristine Bosworth. 05/17/2019. 4.RightGreater Trochanteric Tenderness:No complaints today.Continue to Monitor. Continue with Ice and Heat Therapy.05/17/2019  F/U in 1 month  Tele-Health Visit Telephone Call Established Patient Location of Patient: In Her  Home Location of Provider: In Office Total Time Spent: 10 Minutes

## 2019-05-20 ENCOUNTER — Ambulatory Visit: Payer: Medicare Other | Admitting: Registered Nurse

## 2019-05-21 DIAGNOSIS — H34812 Central retinal vein occlusion, left eye, with macular edema: Secondary | ICD-10-CM | POA: Diagnosis not present

## 2019-05-21 DIAGNOSIS — H26493 Other secondary cataract, bilateral: Secondary | ICD-10-CM | POA: Diagnosis not present

## 2019-05-30 DIAGNOSIS — H3582 Retinal ischemia: Secondary | ICD-10-CM | POA: Diagnosis not present

## 2019-05-30 DIAGNOSIS — H3562 Retinal hemorrhage, left eye: Secondary | ICD-10-CM | POA: Diagnosis not present

## 2019-05-30 DIAGNOSIS — H34832 Tributary (branch) retinal vein occlusion, left eye, with macular edema: Secondary | ICD-10-CM | POA: Diagnosis not present

## 2019-05-30 DIAGNOSIS — H35033 Hypertensive retinopathy, bilateral: Secondary | ICD-10-CM | POA: Diagnosis not present

## 2019-05-31 DIAGNOSIS — H34832 Tributary (branch) retinal vein occlusion, left eye, with macular edema: Secondary | ICD-10-CM | POA: Diagnosis not present

## 2019-06-11 ENCOUNTER — Other Ambulatory Visit: Payer: Self-pay | Admitting: Internal Medicine

## 2019-06-11 MED ORDER — BREO ELLIPTA 100-25 MCG/INH IN AEPB: 1.0000 | INHALATION_SPRAY | Freq: Every day | RESPIRATORY_TRACT | 0 refills | Status: DC

## 2019-06-11 NOTE — Telephone Encounter (Signed)
Patient is requesting a refill on the following medication. She states she will out of the medication tomorrow.   fluticasone furoate-vilanterol (BREO ELLIPTA) 100-25 MCG/INH AEPB   [facility] [phone] - [place] AT [address] Phone:  [phone]  Fax:  [phone]

## 2019-06-12 ENCOUNTER — Encounter: Payer: Medicare Other | Attending: Physical Medicine & Rehabilitation | Admitting: Registered Nurse

## 2019-06-12 ENCOUNTER — Other Ambulatory Visit: Payer: Self-pay

## 2019-06-12 ENCOUNTER — Encounter: Payer: Self-pay | Admitting: Registered Nurse

## 2019-06-12 VITALS — BP 148/88 | Ht 60.0 in | Wt 160.0 lb

## 2019-06-12 DIAGNOSIS — M961 Postlaminectomy syndrome, not elsewhere classified: Secondary | ICD-10-CM | POA: Diagnosis not present

## 2019-06-12 DIAGNOSIS — M4126 Other idiopathic scoliosis, lumbar region: Secondary | ICD-10-CM | POA: Insufficient documentation

## 2019-06-12 DIAGNOSIS — M1611 Unilateral primary osteoarthritis, right hip: Secondary | ICD-10-CM | POA: Insufficient documentation

## 2019-06-12 DIAGNOSIS — G894 Chronic pain syndrome: Secondary | ICD-10-CM | POA: Diagnosis not present

## 2019-06-12 DIAGNOSIS — M24551 Contracture, right hip: Secondary | ICD-10-CM | POA: Diagnosis not present

## 2019-06-12 DIAGNOSIS — Z5181 Encounter for therapeutic drug level monitoring: Secondary | ICD-10-CM

## 2019-06-12 DIAGNOSIS — M48061 Spinal stenosis, lumbar region without neurogenic claudication: Secondary | ICD-10-CM | POA: Insufficient documentation

## 2019-06-12 DIAGNOSIS — Z79891 Long term (current) use of opiate analgesic: Secondary | ICD-10-CM

## 2019-06-12 DIAGNOSIS — M48062 Spinal stenosis, lumbar region with neurogenic claudication: Secondary | ICD-10-CM | POA: Diagnosis not present

## 2019-06-12 MED ORDER — HYDROCODONE-ACETAMINOPHEN 10-325 MG PO TABS: 1.0000 | ORAL_TABLET | Freq: Three times a day (TID) | ORAL | 0 refills | Status: DC | PRN

## 2019-06-12 NOTE — Progress Notes (Signed)
Subjective:    Patient ID: Hannah Scott, female    DOB: October 11, 1950, 69 y.o.   MRN: [id number]  HPI: Hannah Scott is a 69 y.o. female whose appointment was changed to a virtual office visit to reduce the risk of exposure to the COVID-19 virus and to help Hannah Scott remain healthy and safe. The virtual visit will also provide continuity of care. Hannah Scott agrees with virtual visit and verbalizes understanding. She states her pain is located in her lower back. She rates her pain 6. Her current exercise regime is walking.  Hannah Scott Morphine equivalent is 33.33 MME.  Last Oral Swab was Performed on 12/18/2018, it was consistent.   Pain Inventory Average Pain 6 Pain Right Now 6 My pain is sharp, tingling and aching  In the last 24 hours, has pain interfered with the following? General activity 6 Relation with others 6 Enjoyment of life 6 What TIME of day is your pain at its worst? all Sleep (in general) Poor  Pain is worse with: walking, standing and some activites Pain improves with: medication Relief from Meds: 5  Mobility use a cane use a walker ability to climb steps?  yes do you drive?  yes  Function disabled: date disabled .  Neuro/Psych weakness tingling trouble walking depression  Prior Studies Any changes since last visit?  no  Physicians involved in your care Any changes since last visit?  no   Family History  Problem Relation Age of Onset  . Heart failure Mother   . Pneumonia Mother   . Hypertension Mother   . Heart disease Mother   . Stroke Maternal Grandfather   . Hypertension Maternal Grandfather   . Heart attack Father   . Heart disease Father   . Asthma Paternal Uncle        PAT UNCLES  . Heart attack Paternal Uncle    Social History   Socioeconomic History  . Marital status: Widowed    Spouse name: Not on file  . Number of children: Not on file  . Years of education: Not on file  . Highest education level: Not on file  Occupational History  .  Not on file  Tobacco Use  . Smoking status: Never Smoker  . Smokeless tobacco: Never Used  Substance and Sexual Activity  . Alcohol use: No    Alcohol/week: 0.0 standard drinks  . Drug use: No  . Sexual activity: Never    Birth control/protection: Surgical  Other Topics Concern  . Not on file  Social History Narrative  . Not on file   Social Determinants of Health   Financial Resource Strain:   . Difficulty of Paying Living Expenses: Not on file  Food Insecurity:   . Worried About Charity fundraiser in the Last Year: Not on file  . Ran Out of Food in the Last Year: Not on file  Transportation Needs:   . Lack of Transportation (Medical): Not on file  . Lack of Transportation (Non-Medical): Not on file  Physical Activity:   . Days of Exercise per Week: Not on file  . Minutes of Exercise per Session: Not on file  Stress:   . Feeling of Stress : Not on file  Social Connections:   . Frequency of Communication with Friends and Family: Not on file  . Frequency of Social Gatherings with Friends and Family: Not on file  . Attends Religious Services: Not on file  . Active Member of Clubs or  Organizations: Not on file  . Attends Archivist Meetings: Not on file  . Marital Status: Not on file   Past Surgical History:  Procedure Laterality Date  . ANKLE SURGERY Left    ligament  . APPENDECTOMY  1987   AT [facility]  . BACK SURGERY     Fusion  . BREAST LUMPECTOMY  2003   radiation on right  . BREAST LUMPECTOMY WITH RADIOACTIVE SEED LOCALIZATION Left 02/12/2016   Procedure: LEFT BREAST LUMPECTOMY WITH RADIOACTIVE SEED LOCALIZATION;  Surgeon: Excell Seltzer, MD;  Location: [place];  Service: General;  Laterality: Left;  . CARDIOVASCULAR STRESS TEST  09/07/05   Nuclear, was negative  . CATARACT EXTRACTION Bilateral 01,03  . OOPHORECTOMY     LSO -RSO  . PELVIC LAPAROSCOPY  1989   RSO, LYSIS OF ADHESIONS  . TOTAL ABDOMINAL HYSTERECTOMY  1987   LSO,  APPENDECTOMY  . TOTAL HIP ARTHROPLASTY Right 10/28/2013   dr Lorin Mercy  . TOTAL HIP ARTHROPLASTY Right 10/28/2013   Procedure: TOTAL HIP ARTHROPLASTY ANTERIOR APPROACH;  Surgeon: Marybelle Killings, MD;  Location: [place];  Service: Orthopedics;  Laterality: Right;  Right Total Hip Arthroplasty, Direct Anterior Approach   Past Medical History:  Diagnosis Date  . Anemia    hx  . Arthritis   . Asthma    PFTs, February, 2011, moderate obstructive disease with response to bronchodilators, normal lung volumes, moderate reduction in diffusing capacity  . Atrial septal aneurysm    Echo, 2008-not noted on 13 echo  . CAD (coronary artery disease)    90% distal LAD in the past  /   nuclear, 2008, no ischemia, ejection fraction 70%  . Cancer ([place]) 2002   DUCTAL CIS--S/P LUMPECTOMY, RADIATION AND 6 WEEKS OF TAMOXIFEN  . D-dimer, elevated    January, 2014  . Depression   . Ejection fraction    EF 60%, echo, October, 2008  . Elevated CPK    January, 2014  . Endometriosis 1989   RIGHT TUBE  . Endometriosis 1987   LEFT TUBE/OVARY W FOCAL IN-SITU ENDOMETRIAL ADENOCARCINOMA  . GERD (gastroesophageal reflux disease)    occ  . H/O hiatal hernia    ?  [name] Hyperlipidemia   . Hypertension   . Hypothyroidism    Patient has had in the past that she does not need treatment  . Kyphoscoliosis   . Obstructive airway disease ([place])   . Pinched nerve    lower back  . Shortness of breath   . UTI (lower urinary tract infection)    There were no vitals taken for this visit.  Opioid Risk Score:   Fall Risk Score:  `1  Depression screen PHQ 2/9  Depression screen [facility] 2/9 09/11/2018 05/16/2018 01/17/2018 06/02/2017 05/03/2017 01/27/2017 01/10/2017  Decreased Interest 2 1 1 1 1 1  0  Down, Depressed, Hopeless 2 1 1 1 1 1 2   PHQ - 2 Score 4 2 2 2 2 2 2   Altered sleeping - - - - - - 1  Tired, decreased energy - - - - - - 1  Change in appetite - - - - - - 0  Feeling bad or failure about yourself  - - - - - - 1  Trouble  concentrating - - - - - - 0  Moving slowly or fidgety/restless - - - - - - 0  Suicidal thoughts - - - - - - 0  PHQ-9 Score - - - - - - 5  Difficult doing work/chores - - - - - - Not difficult at all  Some recent data might be hidden     Review of Systems  Constitutional: Negative.   HENT: Negative.   Eyes: Negative.   Respiratory: Negative.   Cardiovascular: Negative.   Gastrointestinal: Negative.   Endocrine: Negative.   Genitourinary: Negative.   Musculoskeletal: Positive for arthralgias, back pain, gait problem and myalgias.  Skin: Negative.   Allergic/Immunologic: Negative.   Neurological: Positive for weakness and numbness.  Hematological: Negative.   Psychiatric/Behavioral: Positive for dysphoric mood.  All other systems reviewed and are negative.      Objective:   Physical Exam Vitals and nursing note reviewed.  Musculoskeletal:     Comments: No Physical Examination Performed: Virtual Visit           Assessment & Plan:  1.Lumbar post laminectomy syndromewith severe kyphoscoliosis thoracolumbar spine, s/p lumbar fusion/ 06/12/2019 Continue current medication regimen.Refilled: Hydrocodone 10/325mg  one tablet every8hours may take an extra tablet when pain is severe#100. We will continue the opioid monitoring program, this consists of regular clinic visits, examinations, urine drug screen, pill counts as well as use of [place] Controlled Substance Reporting system. 2. Right Hip OA: S/P Right Hip Replacement 10/28/2013.06/12/2019 3.Depression: Continue Cymbalta. Has Family Support and Friends. Counseling with Doristine Bosworth.06/12/2019. 4.RightGreater Trochanteric Tenderness:No complaints today.Continue to Monitor. Continue with Ice and Heat Therapy.06/12/2019  F/U in 1 month  Tele-Health Visit Telephone Call Established Patient Location of Patient: In Her Home Location of Provider: In Office Total Time Spent: 10 Minutes

## 2019-06-13 ENCOUNTER — Ambulatory Visit: Payer: Medicare Other | Admitting: Internal Medicine

## 2019-06-16 NOTE — Progress Notes (Signed)
Subjective:    Patient ID: Hannah Scott, female    DOB: 1950-06-29, 69 y.o.   MRN: [id number]  HPI The patient is here for an acute visit.   BP running high at home 152/79, 191/93, 186/100, 187/87, 177/88, 178/99, 165/92, 150/85.  She states it has been high at home for a while, but she was concerned about coming into the office because of Covid.  She is taking all of her medications as prescribed.   She has had some blurry vision in her left eye and did see her eye doctor and she does have some swelling in the back of the eye that could be related to high blood pressure.  She has not had any headaches, lightheadedness or dizziness.   She gets more hoarsness and wonders about her thyroid.  She has a history of multinodular thyroid and has had radioactive iodine treatment in the past.  She was following with Dr. Loanne Drilling for a while, but stopped because of stability in her thyroid.  She denies any difficulty swallowing.  She denies frequent heartburn.  She has occasional postnasal drip.  Medications and allergies reviewed with patient and updated if appropriate.  Patient Active Problem List   Diagnosis Date Noted  . Murmur, cardiac 01/16/2018  . Moderate persistent asthma with exacerbation 12/13/2017  . Osteoporosis 01/07/2017  . Acute bronchitis 10/17/2016  . Prediabetes 07/07/2016  . History of breast cancer 01/06/2016  . Intraductal papilloma of left breast 01/06/2016  . Essential hypertension 06/08/2015  . DOE (dyspnea on exertion) 06/08/2015  . Fatigue 05/20/2015  . Back pain 04/22/2015  . Anemia due to blood loss 11/27/2013  . Elevated CPK   . Multinodular goiter 12/01/2011  . Asthma   . Hypothyroidism   . CAD (coronary artery disease)   . Atrial septal aneurysm   . Hyperlipidemia   . Scoliosis   . Spinal stenosis, lumbar region, with neurogenic claudication 07/01/2011  . Lumbar postlaminectomy syndrome 07/01/2011  . Osteoarthritis of right hip 07/01/2011  . Contracture  of right hip 07/01/2011  . Endometriosis   . Depression 12/24/2009  . Migraine headache 05/19/2006    Current Outpatient Medications on File Prior to Visit  Medication Sig Dispense Refill  . albuterol (PROVENTIL HFA;VENTOLIN HFA) 108 (90 Base) MCG/ACT inhaler Inhale 2 puffs into the lungs every 4 (four) hours as needed for wheezing or shortness of breath. 1 Inhaler 0  . atorvastatin (LIPITOR) 20 MG tablet TAKE 1 TABLET BY MOUTH DAILY 90 tablet 3  . benazepril (LOTENSIN) 40 MG tablet TAKE 1 TABLET BY MOUTH DAILY 90 tablet 2  . DULoxetine (CYMBALTA) 60 MG capsule TAKE ONE CAPSULE EVERY DAY 90 capsule 1  . fluticasone furoate-vilanterol (BREO ELLIPTA) 100-25 MCG/INH AEPB Inhale 1 puff into the lungs daily. 28 each 0  . gabapentin (NEURONTIN) 600 MG tablet Take 1 tablet (600 mg total) by mouth at bedtime. 30 tablet 5  . HYDROcodone-acetaminophen (NORCO) 10-325 MG tablet Take 1 tablet by mouth every 8 (eight) hours as needed. 100 tablet 0  . ipratropium-albuterol (DUONEB) 0.5-2.5 (3) MG/3ML SOLN Take 3 mLs by nebulization every 4 (four) hours as needed (wheezing or SOB). During exacerbation 360 mL 1  . levothyroxine (SYNTHROID) 75 MCG tablet Take 1 tablet (75 mcg total) by mouth daily. Annual appt due in Sept must see provider for future refills 90 tablet 0  . metoprolol tartrate (LOPRESSOR) 50 MG tablet Take 2 tablets (100 mg total) by mouth 2 (two) times daily. [place]  tablet 0   No current facility-administered medications on file prior to visit.    Past Medical History:  Diagnosis Date  . Anemia    hx  . Arthritis   . Asthma    PFTs, February, 2011, moderate obstructive disease with response to bronchodilators, normal lung volumes, moderate reduction in diffusing capacity  . Atrial septal aneurysm    Echo, 2008-not noted on 13 echo  . CAD (coronary artery disease)    90% distal LAD in the past  /   nuclear, 2008, no ischemia, ejection fraction 70%  . Cancer ([place]) 2002   DUCTAL CIS--S/P  LUMPECTOMY, RADIATION AND 6 WEEKS OF TAMOXIFEN  . D-dimer, elevated    January, 2014  . Depression   . Ejection fraction    EF 60%, echo, October, 2008  . Elevated CPK    January, 2014  . Endometriosis 1989   RIGHT TUBE  . Endometriosis 1987   LEFT TUBE/OVARY W FOCAL IN-SITU ENDOMETRIAL ADENOCARCINOMA  . GERD (gastroesophageal reflux disease)    occ  . H/O hiatal hernia    ?  [name] Hyperlipidemia   . Hypertension   . Hypothyroidism    Patient has had in the past that she does not need treatment  . Kyphoscoliosis   . Obstructive airway disease ([place])   . Pinched nerve    lower back  . Shortness of breath   . UTI (lower urinary tract infection)     Past Surgical History:  Procedure Laterality Date  . ANKLE SURGERY Left    ligament  . APPENDECTOMY  1987   AT [facility]  . BACK SURGERY     Fusion  . BREAST LUMPECTOMY  2003   radiation on right  . BREAST LUMPECTOMY WITH RADIOACTIVE SEED LOCALIZATION Left 02/12/2016   Procedure: LEFT BREAST LUMPECTOMY WITH RADIOACTIVE SEED LOCALIZATION;  Surgeon: Excell Seltzer, MD;  Location: [place];  Service: General;  Laterality: Left;  . CARDIOVASCULAR STRESS TEST  09/07/05   Nuclear, was negative  . CATARACT EXTRACTION Bilateral 01,03  . OOPHORECTOMY     LSO -RSO  . PELVIC LAPAROSCOPY  1989   RSO, LYSIS OF ADHESIONS  . TOTAL ABDOMINAL HYSTERECTOMY  1987   LSO, APPENDECTOMY  . TOTAL HIP ARTHROPLASTY Right 10/28/2013   dr Lorin Mercy  . TOTAL HIP ARTHROPLASTY Right 10/28/2013   Procedure: TOTAL HIP ARTHROPLASTY ANTERIOR APPROACH;  Surgeon: Marybelle Killings, MD;  Location: [place];  Service: Orthopedics;  Laterality: Right;  Right Total Hip Arthroplasty, Direct Anterior Approach    Social History   Socioeconomic History  . Marital status: Widowed    Spouse name: Not on file  . Number of children: Not on file  . Years of education: Not on file  . Highest education level: Not on file  Occupational History  . Not on file  Tobacco  Use  . Smoking status: Never Smoker  . Smokeless tobacco: Never Used  Substance and Sexual Activity  . Alcohol use: No    Alcohol/week: 0.0 standard drinks  . Drug use: No  . Sexual activity: Never    Birth control/protection: Surgical  Other Topics Concern  . Not on file  Social History Narrative  . Not on file   Social Determinants of Health   Financial Resource Strain:   . Difficulty of Paying Living Expenses: Not on file  Food Insecurity:   . Worried About Charity fundraiser in the Last Year: Not on file  . Ran Out of  Food in the Last Year: Not on file  Transportation Needs:   . Lack of Transportation (Medical): Not on file  . Lack of Transportation (Non-Medical): Not on file  Physical Activity:   . Days of Exercise per Week: Not on file  . Minutes of Exercise per Session: Not on file  Stress:   . Feeling of Stress : Not on file  Social Connections:   . Frequency of Communication with Friends and Family: Not on file  . Frequency of Social Gatherings with Friends and Family: Not on file  . Attends Religious Services: Not on file  . Active Member of Clubs or Organizations: Not on file  . Attends Archivist Meetings: Not on file  . Marital Status: Not on file    Family History  Problem Relation Age of Onset  . Heart failure Mother   . Pneumonia Mother   . Hypertension Mother   . Heart disease Mother   . Stroke Maternal Grandfather   . Hypertension Maternal Grandfather   . Heart attack Father   . Heart disease Father   . Asthma Paternal Uncle        PAT UNCLES  . Heart attack Paternal Uncle     Review of Systems  Constitutional: Negative for fever.  Eyes: Positive for visual disturbance (saw eye doctor - left eye blurry - may be related to HTN).  Cardiovascular: Negative for chest pain, palpitations and leg swelling.  Neurological: Negative for dizziness, light-headedness and headaches.       Objective:   Vitals:   06/17/19 1411  BP: (!)  194/100  Pulse: 71  Resp: 16  Temp: 98.1 F (36.7 C)  SpO2: 93%   BP Readings from Last 3 Encounters:  06/17/19 (!) 194/100  06/12/19 (!) 148/88  05/17/19 (!) 151/85   Wt Readings from Last 3 Encounters:  06/17/19 167 lb 6.4 oz (75.9 kg)  06/12/19 160 lb (72.6 kg)  05/17/19 162 lb (73.5 kg)   Body mass index is 32.69 kg/m.   Physical Exam    Constitutional: Appears well-developed and well-nourished. No distress.  Head: Normocephalic and atraumatic.  Neck: Neck supple. No tracheal deviation present.  Nodular thyromegaly present-does not feel enlarged.  No cervical lymphadenopathy Cardiovascular: Normal rate, regular rhythm and normal heart sounds.  No murmur heard. No carotid bruit .  No edema Pulmonary/Chest: Effort normal and breath sounds normal. No respiratory distress. No has no wheezes. No rales.  Skin: Skin is warm and dry. Not diaphoretic.  Psychiatric: Normal mood and affect. Behavior is normal.       Assessment & Plan:    See Problem List for Assessment and Plan of chronic medical problems.    This visit occurred during the SARS-CoV-2 public health emergency.  Safety protocols were in place, including screening questions prior to the visit, additional usage of staff PPE, and extensive cleaning of exam room while observing appropriate contact time as indicated for disinfecting solutions.

## 2019-06-17 ENCOUNTER — Ambulatory Visit (INDEPENDENT_AMBULATORY_CARE_PROVIDER_SITE_OTHER): Payer: Medicare Other | Admitting: Internal Medicine

## 2019-06-17 ENCOUNTER — Other Ambulatory Visit: Payer: Self-pay

## 2019-06-17 ENCOUNTER — Encounter: Payer: Self-pay | Admitting: Internal Medicine

## 2019-06-17 VITALS — BP 194/100 | HR 71 | Temp 98.1°F | Resp 16 | Ht 60.0 in | Wt 167.4 lb

## 2019-06-17 DIAGNOSIS — E042 Nontoxic multinodular goiter: Secondary | ICD-10-CM | POA: Diagnosis not present

## 2019-06-17 DIAGNOSIS — I10 Essential (primary) hypertension: Secondary | ICD-10-CM

## 2019-06-17 MED ORDER — HYDROCHLOROTHIAZIDE 25 MG PO TABS: 25.0000 mg | ORAL_TABLET | Freq: Every day | ORAL | 3 refills | Status: DC

## 2019-06-17 NOTE — Patient Instructions (Addendum)
   Medications reviewed and updated.  Changes include :   Start hydrochlorothiazide 25 mg daily  Your prescription(s) have been submitted to your pharmacy. Please take as directed and contact our office if you believe you are having problem(s) with the medication(s).  Monitor your BP at home.   Please followup in 2-3 weeks

## 2019-06-17 NOTE — Assessment & Plan Note (Signed)
Has nodular thyroid on exam-has not had an ultrasound in several years.  She is having intermittent hoarseness, which could be related to her thyroid, but discussed could also be related to GERD or postnasal drip Discussed getting a thyroid ultrasound, but she deferred at this time Will discuss at her next visit

## 2019-06-17 NOTE — Assessment & Plan Note (Signed)
Chronic Not ideally controlled Continue benazepril 40 mg daily Continue metoprolol 100 mg twice daily Has not tolerated amlodipine in the past because of swelling Has been on hydrochlorothiazide in the past and tolerated that-we will start hydrochlorothiazide 25 mg daily.  Discussed increased risk of dehydration Monitor BP at home and follow-up in 2-3 weeks Will check BMP at that time May need to add an additional medication depending on BP at her next visit Advised her to call with any questions or concerns prior to her next appointment

## 2019-06-24 ENCOUNTER — Other Ambulatory Visit: Payer: Self-pay

## 2019-07-05 DIAGNOSIS — H34832 Tributary (branch) retinal vein occlusion, left eye, with macular edema: Secondary | ICD-10-CM | POA: Diagnosis not present

## 2019-07-09 ENCOUNTER — Other Ambulatory Visit: Payer: Self-pay | Admitting: Internal Medicine

## 2019-07-09 NOTE — Progress Notes (Signed)
Subjective:    Patient ID: Hannah Scott, female    DOB: 10-21-1950, 69 y.o.   MRN: [id number]  HPI The patient is here for follow up of their chronic medical problems, including hypertension, prediabetes, hyperlipidemia, hypothyroidism, depression  She is taking all of her medications as prescribed.    We started hctz 3 weeks ago.   Her BP at home since then -  135/77, 124/77, 130/70, 129/74, 104/63, 124/76, 113/68    She is taking the Cymbalta daily as prescribed.  The other day she did feel depressed on it last a couple days, but that improved.  She thinks it was because she was not able to see her family that day.  If he continued she plans on calling me.  She feels okay and feels the Cymbalta dose is adequate at this time.   Medications and allergies reviewed with patient and updated if appropriate.  Patient Active Problem List   Diagnosis Date Noted  . Murmur, cardiac 01/16/2018  . Moderate persistent asthma with exacerbation 12/13/2017  . Osteoporosis 01/07/2017  . Acute bronchitis 10/17/2016  . Prediabetes 07/07/2016  . History of breast cancer 01/06/2016  . Intraductal papilloma of left breast 01/06/2016  . Essential hypertension 06/08/2015  . DOE (dyspnea on exertion) 06/08/2015  . Fatigue 05/20/2015  . Back pain 04/22/2015  . Anemia due to blood loss 11/27/2013  . Elevated CPK   . Multinodular goiter 12/01/2011  . Asthma   . Hypothyroidism   . CAD (coronary artery disease)   . Atrial septal aneurysm   . Hyperlipidemia   . Scoliosis   . Spinal stenosis, lumbar region, with neurogenic claudication 07/01/2011  . Lumbar postlaminectomy syndrome 07/01/2011  . Osteoarthritis of right hip 07/01/2011  . Contracture of right hip 07/01/2011  . Endometriosis   . Depression 12/24/2009  . Migraine headache 05/19/2006    Current Outpatient Medications on File Prior to Visit  Medication Sig Dispense Refill  . albuterol (PROVENTIL HFA;VENTOLIN HFA) 108 (90 Base)  MCG/ACT inhaler Inhale 2 puffs into the lungs every 4 (four) hours as needed for wheezing or shortness of breath. 1 Inhaler 0  . atorvastatin (LIPITOR) 20 MG tablet TAKE 1 TABLET BY MOUTH DAILY 90 tablet 3  . benazepril (LOTENSIN) 40 MG tablet TAKE 1 TABLET BY MOUTH DAILY 90 tablet 2  . BREO ELLIPTA 100-25 MCG/INH AEPB INHALE 1 PUFF INTO THE LUNGS DAILY 60 each 1  . DULoxetine (CYMBALTA) 60 MG capsule TAKE ONE CAPSULE EVERY DAY 90 capsule 1  . gabapentin (NEURONTIN) 600 MG tablet Take 1 tablet (600 mg total) by mouth at bedtime. 30 tablet 5  . hydrochlorothiazide (HYDRODIURIL) 25 MG tablet Take 1 tablet (25 mg total) by mouth daily. 90 tablet 3  . HYDROcodone-acetaminophen (NORCO) 10-325 MG tablet Take 1 tablet by mouth every 8 (eight) hours as needed. 100 tablet 0  . ipratropium-albuterol (DUONEB) 0.5-2.5 (3) MG/3ML SOLN Take 3 mLs by nebulization every 4 (four) hours as needed (wheezing or SOB). During exacerbation 360 mL 1  . levothyroxine (SYNTHROID) 75 MCG tablet Take 1 tablet (75 mcg total) by mouth daily. Annual appt due in Sept must see provider for future refills 90 tablet 0  . metoprolol tartrate (LOPRESSOR) 50 MG tablet Take 2 tablets (100 mg total) by mouth 2 (two) times daily. 120 tablet 0   No current facility-administered medications on file prior to visit.    Past Medical History:  Diagnosis Date  . Anemia    hx  .  Arthritis   . Asthma    PFTs, February, 2011, moderate obstructive disease with response to bronchodilators, normal lung volumes, moderate reduction in diffusing capacity  . Atrial septal aneurysm    Echo, 2008-not noted on 13 echo  . CAD (coronary artery disease)    90% distal LAD in the past  /   nuclear, 2008, no ischemia, ejection fraction 70%  . Cancer ([place]) 2002   DUCTAL CIS--S/P LUMPECTOMY, RADIATION AND 6 WEEKS OF TAMOXIFEN  . D-dimer, elevated    January, 2014  . Depression   . Ejection fraction    EF 60%, echo, October, 2008  . Elevated CPK     January, 2014  . Endometriosis 1989   RIGHT TUBE  . Endometriosis 1987   LEFT TUBE/OVARY W FOCAL IN-SITU ENDOMETRIAL ADENOCARCINOMA  . GERD (gastroesophageal reflux disease)    occ  . H/O hiatal hernia    ?  [name] Hyperlipidemia   . Hypertension   . Hypothyroidism    Patient has had in the past that she does not need treatment  . Kyphoscoliosis   . Obstructive airway disease ([place])   . Pinched nerve    lower back  . Shortness of breath   . UTI (lower urinary tract infection)     Past Surgical History:  Procedure Laterality Date  . ANKLE SURGERY Left    ligament  . APPENDECTOMY  1987   AT [facility]  . BACK SURGERY     Fusion  . BREAST LUMPECTOMY  2003   radiation on right  . BREAST LUMPECTOMY WITH RADIOACTIVE SEED LOCALIZATION Left 02/12/2016   Procedure: LEFT BREAST LUMPECTOMY WITH RADIOACTIVE SEED LOCALIZATION;  Surgeon: Excell Seltzer, MD;  Location: [facility];  Service: General;  Laterality: Left;  . CARDIOVASCULAR STRESS TEST  09/07/05   Nuclear, was negative  . CATARACT EXTRACTION Bilateral 01,03  . OOPHORECTOMY     LSO -RSO  . PELVIC LAPAROSCOPY  1989   RSO, LYSIS OF ADHESIONS  . TOTAL ABDOMINAL HYSTERECTOMY  1987   LSO, APPENDECTOMY  . TOTAL HIP ARTHROPLASTY Right 10/28/2013   dr Lorin Mercy  . TOTAL HIP ARTHROPLASTY Right 10/28/2013   Procedure: TOTAL HIP ARTHROPLASTY ANTERIOR APPROACH;  Surgeon: Marybelle Killings, MD;  Location: [facility];  Service: Orthopedics;  Laterality: Right;  Right Total Hip Arthroplasty, Direct Anterior Approach    Social History   Socioeconomic History  . Marital status: Widowed    Spouse name: Not on file  . Number of children: Not on file  . Years of education: Not on file  . Highest education level: Not on file  Occupational History  . Not on file  Tobacco Use  . Smoking status: Never Smoker  . Smokeless tobacco: Never Used  Substance and Sexual Activity  . Alcohol use: No    Alcohol/week: 0.0 standard drinks  . Drug use: No    . Sexual activity: Never    Birth control/protection: Surgical  Other Topics Concern  . Not on file  Social History Narrative  . Not on file   Social Determinants of Health   Financial Resource Strain:   . Difficulty of Paying Living Expenses:   Food Insecurity:   . Worried About Charity fundraiser in the Last Year:   . Arboriculturist in the Last Year:   Transportation Needs:   . Film/video editor (Medical):   [name] Lack of Transportation (Non-Medical):   Physical Activity:   . Days of Exercise per  Week:   . Minutes of Exercise per Session:   Stress:   . Feeling of Stress :   Social Connections:   . Frequency of Communication with Friends and Family:   . Frequency of Social Gatherings with Friends and Family:   . Attends Religious Services:   . Active Member of Clubs or Organizations:   . Attends Archivist Meetings:   [name] Marital Status:     Family History  Problem Relation Age of Onset  . Heart failure Mother   . Pneumonia Mother   . Hypertension Mother   . Heart disease Mother   . Stroke Maternal Grandfather   . Hypertension Maternal Grandfather   . Heart attack Father   . Heart disease Father   . Asthma Paternal Uncle        PAT UNCLES  . Heart attack Paternal Uncle     Review of Systems  Constitutional: Negative for fever.  Respiratory: Negative for cough, shortness of breath and wheezing.   Cardiovascular: Negative for chest pain, palpitations and leg swelling.  Neurological: Negative for dizziness, light-headedness and headaches.       Objective:   Vitals:   07/10/19 1418  BP: (!) 162/84  Pulse: 69  Resp: 16  Temp: 98.2 F (36.8 C)  SpO2: 95%   BP Readings from Last 3 Encounters:  07/10/19 (!) 162/84  06/17/19 (!) 194/100  06/12/19 (!) 148/88   Wt Readings from Last 3 Encounters:  07/10/19 164 lb (74.4 kg)  06/17/19 167 lb 6.4 oz (75.9 kg)  06/12/19 160 lb (72.6 kg)   Body mass index is 32.03 kg/m.   Physical Exam     Constitutional: Appears well-developed and well-nourished. No distress.  HENT:  Head: Normocephalic and atraumatic.  Neck: Neck supple. No tracheal deviation present. No thyromegaly present.  No cervical lymphadenopathy Cardiovascular: Normal rate, regular rhythm and normal heart sounds.   No murmur heard. No carotid bruit .  No edema Pulmonary/Chest: Effort normal and breath sounds normal. No respiratory distress. No has no wheezes. No rales.  Skin: Skin is warm and dry. Not diaphoretic.  Psychiatric: Normal mood and affect. Behavior is normal.      Assessment & Plan:    See Problem List for Assessment and Plan of chronic medical problems.    This visit occurred during the SARS-CoV-2 public health emergency.  Safety protocols were in place, including screening questions prior to the visit, additional usage of staff PPE, and extensive cleaning of exam room while observing appropriate contact time as indicated for disinfecting solutions.

## 2019-07-09 NOTE — Patient Instructions (Addendum)
  Blood work was ordered.   ° ° °Medications reviewed and updated.  Changes include :   none ° ° ° °Please followup in 6 months ° ° °

## 2019-07-10 ENCOUNTER — Ambulatory Visit (INDEPENDENT_AMBULATORY_CARE_PROVIDER_SITE_OTHER): Payer: Medicare Other | Admitting: Internal Medicine

## 2019-07-10 ENCOUNTER — Other Ambulatory Visit: Payer: Self-pay

## 2019-07-10 ENCOUNTER — Encounter: Payer: Self-pay | Admitting: Internal Medicine

## 2019-07-10 VITALS — BP 162/84 | HR 69 | Temp 98.2°F | Resp 16 | Ht 60.0 in | Wt 164.0 lb

## 2019-07-10 DIAGNOSIS — R7303 Prediabetes: Secondary | ICD-10-CM | POA: Diagnosis not present

## 2019-07-10 DIAGNOSIS — E7849 Other hyperlipidemia: Secondary | ICD-10-CM | POA: Diagnosis not present

## 2019-07-10 DIAGNOSIS — E039 Hypothyroidism, unspecified: Secondary | ICD-10-CM | POA: Diagnosis not present

## 2019-07-10 DIAGNOSIS — I10 Essential (primary) hypertension: Secondary | ICD-10-CM | POA: Diagnosis not present

## 2019-07-10 DIAGNOSIS — F3289 Other specified depressive episodes: Secondary | ICD-10-CM

## 2019-07-10 LAB — COMPREHENSIVE METABOLIC PANEL
ALT: 19 U/L (ref 0–35)
AST: 22 U/L (ref 0–37)
Albumin: 3.8 g/dL (ref 3.5–5.2)
Alkaline Phosphatase: 110 U/L (ref 39–117)
BUN: 17 mg/dL (ref 6–23)
CO2: 36 mEq/L — ABNORMAL HIGH (ref 19–32)
Calcium: 9.2 mg/dL (ref 8.4–10.5)
Chloride: 96 mEq/L (ref 96–112)
Creatinine, Ser: 0.82 mg/dL (ref 0.40–1.20)
GFR: 68.93 mL/min (ref >60.00)
Glucose, Bld: 98 mg/dL (ref 70–99)
Potassium: 4.3 mEq/L (ref 3.5–5.1)
Sodium: 139 mEq/L (ref 135–145)
Total Bilirubin: 0.6 mg/dL (ref 0.2–1.2)
Total Protein: 6.3 g/dL (ref 6.0–8.3)

## 2019-07-10 LAB — LIPID PANEL
Cholesterol: 124 mg/dL (ref 0–200)
HDL: 49.1 mg/dL (ref >39.00)
LDL Cholesterol: 58 mg/dL (ref 0–99)
NonHDL: 75.39
Total CHOL/HDL Ratio: 3
Triglycerides: 88 mg/dL (ref 0.0–149.0)
VLDL: 17.6 mg/dL (ref 0.0–40.0)

## 2019-07-10 LAB — TSH: TSH: 3.09 u[IU]/mL (ref 0.35–4.50)

## 2019-07-10 LAB — HEMOGLOBIN A1C: Hgb A1c MFr Bld: 6.1 % (ref 4.6–6.5)

## 2019-07-10 NOTE — Assessment & Plan Note (Signed)
Chronic Check lipid panel  Continue daily statin Regular exercise and healthy diet encouraged  

## 2019-07-10 NOTE — Assessment & Plan Note (Signed)
Chronic Blood pressure better controlled after addition of hydrochlorothiazide Had one low BP reading at home and she did hold the medication that day Continue current medications at current doses She will continue to monitor BP at home CMP Follow-up in 6 months, sooner if needed

## 2019-07-10 NOTE — Assessment & Plan Note (Signed)
Chronic Check a1c Low sugar / carb diet Stressed regular exercise  

## 2019-07-10 NOTE — Assessment & Plan Note (Signed)
Chronic Controlled, stable Continue current dose of medication Cymbalta 60 mg daily 

## 2019-07-10 NOTE — Assessment & Plan Note (Signed)
Chronic  Clinically euthyroid Check tsh  Titrate med dose if needed  

## 2019-07-11 ENCOUNTER — Encounter: Payer: Self-pay | Admitting: Internal Medicine

## 2019-07-12 ENCOUNTER — Encounter: Payer: Self-pay | Admitting: Registered Nurse

## 2019-07-12 ENCOUNTER — Encounter: Payer: Medicare Other | Attending: Physical Medicine & Rehabilitation | Admitting: Registered Nurse

## 2019-07-12 ENCOUNTER — Other Ambulatory Visit: Payer: Self-pay

## 2019-07-12 VITALS — BP 107/58 | HR 61 | Temp 97.7°F | Ht 60.0 in | Wt 165.0 lb

## 2019-07-12 DIAGNOSIS — M48061 Spinal stenosis, lumbar region without neurogenic claudication: Secondary | ICD-10-CM | POA: Insufficient documentation

## 2019-07-12 DIAGNOSIS — M961 Postlaminectomy syndrome, not elsewhere classified: Secondary | ICD-10-CM

## 2019-07-12 DIAGNOSIS — M48062 Spinal stenosis, lumbar region with neurogenic claudication: Secondary | ICD-10-CM | POA: Diagnosis not present

## 2019-07-12 DIAGNOSIS — M24551 Contracture, right hip: Secondary | ICD-10-CM

## 2019-07-12 DIAGNOSIS — M4126 Other idiopathic scoliosis, lumbar region: Secondary | ICD-10-CM | POA: Diagnosis not present

## 2019-07-12 DIAGNOSIS — M1611 Unilateral primary osteoarthritis, right hip: Secondary | ICD-10-CM | POA: Diagnosis not present

## 2019-07-12 DIAGNOSIS — Z79891 Long term (current) use of opiate analgesic: Secondary | ICD-10-CM

## 2019-07-12 DIAGNOSIS — G894 Chronic pain syndrome: Secondary | ICD-10-CM | POA: Diagnosis not present

## 2019-07-12 DIAGNOSIS — Z5181 Encounter for therapeutic drug level monitoring: Secondary | ICD-10-CM | POA: Diagnosis not present

## 2019-07-12 MED ORDER — HYDROCODONE-ACETAMINOPHEN 10-325 MG PO TABS: 1.0000 | ORAL_TABLET | Freq: Three times a day (TID) | ORAL | 0 refills | Status: DC | PRN

## 2019-07-12 NOTE — Progress Notes (Signed)
Subjective:    Patient ID: Hannah Scott, female    DOB: 01/03/1951, 69 y.o.   MRN: [id number]  HPI: Hannah Scott is a 69 y.o. female who returns for follow up appointment for chronic pain and medication refill. She states her pain is located in her lower back. She rates her pain 6. Her current exercise regime is walking with her walker or cane she reports.   [name] Morphine equivalent is 33.33 MME.    Last Oral Swab was performed on 12/18/2018, it was consistent.   Pain Inventory Average Pain 6 Pain Right Now 6 My pain is burning, tingling and aching  In the last 24 hours, has pain interfered with the following? General activity 10 Relation with others 10 Enjoyment of life 10 What TIME of day is your pain at its worst? all Sleep (in general) Poor  Pain is worse with: walking and standing Pain improves with: rest and medication Relief from Meds: 5  Mobility walk with assistance use a cane use a walker ability to climb steps?  yes do you drive?  yes  Function disabled: date disabled . I need assistance with the following:  household duties  Neuro/Psych weakness tingling trouble walking depression  Prior Studies Any changes since last visit?  no  Physicians involved in your care Any changes since last visit?  no   Family History  Problem Relation Age of Onset  . Heart failure Mother   . Pneumonia Mother   . Hypertension Mother   . Heart disease Mother   . Stroke Maternal Grandfather   . Hypertension Maternal Grandfather   . Heart attack Father   . Heart disease Father   . Asthma Paternal Uncle        PAT UNCLES  . Heart attack Paternal Uncle    Social History   Socioeconomic History  . Marital status: Widowed    Spouse name: Not on file  . Number of children: Not on file  . Years of education: Not on file  . Highest education level: Not on file  Occupational History  . Not on file  Tobacco Use  . Smoking status: Never Smoker  . Smokeless  tobacco: Never Used  Substance and Sexual Activity  . Alcohol use: No    Alcohol/week: 0.0 standard drinks  . Drug use: No  . Sexual activity: Never    Birth control/protection: Surgical  Other Topics Concern  . Not on file  Social History Narrative  . Not on file   Social Determinants of Health   Financial Resource Strain:   . Difficulty of Paying Living Expenses:   Food Insecurity:   . Worried About Charity fundraiser in the Last Year:   . Arboriculturist in the Last Year:   Transportation Needs:   . Film/video editor (Medical):   [name] Lack of Transportation (Non-Medical):   Physical Activity:   . Days of Exercise per Week:   . Minutes of Exercise per Session:   Stress:   . Feeling of Stress :   Social Connections:   . Frequency of Communication with Friends and Family:   . Frequency of Social Gatherings with Friends and Family:   . Attends Religious Services:   . Active Member of Clubs or Organizations:   . Attends Archivist Meetings:   [name] Marital Status:    Past Surgical History:  Procedure Laterality Date  . ANKLE SURGERY Left    ligament  .  APPENDECTOMY  1987   AT [facility]  . BACK SURGERY     Fusion  . BREAST LUMPECTOMY  2003   radiation on right  . BREAST LUMPECTOMY WITH RADIOACTIVE SEED LOCALIZATION Left 02/12/2016   Procedure: LEFT BREAST LUMPECTOMY WITH RADIOACTIVE SEED LOCALIZATION;  Surgeon: Excell Seltzer, MD;  Location: [facility];  Service: General;  Laterality: Left;  . CARDIOVASCULAR STRESS TEST  09/07/05   Nuclear, was negative  . CATARACT EXTRACTION Bilateral 01,03  . OOPHORECTOMY     LSO -RSO  . PELVIC LAPAROSCOPY  1989   RSO, LYSIS OF ADHESIONS  . TOTAL ABDOMINAL HYSTERECTOMY  1987   LSO, APPENDECTOMY  . TOTAL HIP ARTHROPLASTY Right 10/28/2013   dr Lorin Mercy  . TOTAL HIP ARTHROPLASTY Right 10/28/2013   Procedure: TOTAL HIP ARTHROPLASTY ANTERIOR APPROACH;  Surgeon: Marybelle Killings, MD;  Location: [facility];  Service:  Orthopedics;  Laterality: Right;  Right Total Hip Arthroplasty, Direct Anterior Approach   Past Medical History:  Diagnosis Date  . Anemia    hx  . Arthritis   . Asthma    PFTs, February, 2011, moderate obstructive disease with response to bronchodilators, normal lung volumes, moderate reduction in diffusing capacity  . Atrial septal aneurysm    Echo, 2008-not noted on 13 echo  . CAD (coronary artery disease)    90% distal LAD in the past  /   nuclear, 2008, no ischemia, ejection fraction 70%  . Cancer ([place]) 2002   DUCTAL CIS--S/P LUMPECTOMY, RADIATION AND 6 WEEKS OF TAMOXIFEN  . D-dimer, elevated    January, 2014  . Depression   . Ejection fraction    EF 60%, echo, October, 2008  . Elevated CPK    January, 2014  . Endometriosis 1989   RIGHT TUBE  . Endometriosis 1987   LEFT TUBE/OVARY W FOCAL IN-SITU ENDOMETRIAL ADENOCARCINOMA  . GERD (gastroesophageal reflux disease)    occ  . H/O hiatal hernia    ?  [name] Hyperlipidemia   . Hypertension   . Hypothyroidism    Patient has had in the past that she does not need treatment  . Kyphoscoliosis   . Obstructive airway disease ([place])   . Pinched nerve    lower back  . Shortness of breath   . UTI (lower urinary tract infection)    BP (!) 107/58   Pulse 61   Temp 97.7 F (36.5 C)   Ht 5' (1.524 m)   Wt 165 lb (74.8 kg)   SpO2 95%   BMI 32.22 kg/m   Opioid Risk Score:   Fall Risk Score:  `1  Depression screen PHQ 2/9  Depression screen [facility] 2/9 09/11/2018 05/16/2018 01/17/2018 06/02/2017 05/03/2017 01/27/2017 01/10/2017  Decreased Interest 2 1 1 1 1 1  0  Down, Depressed, Hopeless 2 1 1 1 1 1 2   PHQ - 2 Score 4 2 2 2 2 2 2   Altered sleeping - - - - - - 1  Tired, decreased energy - - - - - - 1  Change in appetite - - - - - - 0  Feeling bad or failure about yourself  - - - - - - 1  Trouble concentrating - - - - - - 0  Moving slowly or fidgety/restless - - - - - - 0  Suicidal thoughts - - - - - - 0  PHQ-9 Score - - - - - - 5    Difficult doing work/chores - - - - - - Not  difficult at all  Some recent data might be hidden    Review of Systems  Neurological: Positive for weakness.       Tingling  Psychiatric/Behavioral: Positive for dysphoric mood.  All other systems reviewed and are negative.      Objective:   Physical Exam Vitals and nursing note reviewed.  Constitutional:      Appearance: Normal appearance.  Cardiovascular:     Rate and Rhythm: Normal rate and regular rhythm.     Pulses: Normal pulses.     Heart sounds: Normal heart sounds.  Pulmonary:     Effort: Pulmonary effort is normal.     Breath sounds: Normal breath sounds.  Musculoskeletal:     Cervical back: Normal range of motion and neck supple.     Comments: Normal Muscle Bulk and Muscle Testing Reveals:  Upper Extremities: Full ROM and Muscle Strength 5/5  Lumbar Paraspinal Tenderness: L-4-L-5 Lower Extremities: Full ROM and Muscle Strength 5/5 Arises from Table slowly Narrow Based  Gait   Skin:    General: Skin is warm and dry.  Neurological:     Mental Status: She is alert and oriented to person, place, and time.  Psychiatric:        Mood and Affect: Mood normal.        Behavior: Behavior normal.           Assessment & Plan:  1.Lumbar post laminectomy syndromewith severe kyphoscoliosis thoracolumbar spine, s/p lumbar fusion/ 07/12/2019 Continue current medication regimen.Refilled: Hydrocodone 10/325mg  one tablet every8hours may take an extra tablet when pain is severe#100. We will continue the opioid monitoring program, this consists of regular clinic visits, examinations, urine drug screen, pill counts as well as use of [place] Controlled Substance Reporting system. 2. Right Hip OA: S/P Right Hip Replacement 10/28/2013.07/12/2019 3.Depression: Continue Cymbalta. Has Family Support and Friends. Counseling with Doristine Bosworth.07/12/2019. 4.RightGreater Trochanteric Tenderness:No complaints today.Continue to  Monitor. Continue with Ice and Heat Therapy.07/12/2019  F/U in 1 month  15 minutes of face to face patient care time was spent during this visit. All questions were encouraged and answered.

## 2019-07-15 LAB — DRUG TOX MONITOR 1 W/CONF, ORAL FLD
Amphetamines: NEGATIVE ng/mL (ref <10)
Barbiturates: NEGATIVE ng/mL (ref <10)
Benzodiazepines: NEGATIVE ng/mL (ref <0.50)
Buprenorphine: NEGATIVE ng/mL (ref <0.10)
Cocaine: NEGATIVE ng/mL (ref <5.0)
Codeine: NEGATIVE ng/mL (ref <2.5)
Dihydrocodeine: 17.8 ng/mL — ABNORMAL HIGH (ref <2.5)
Fentanyl: NEGATIVE ng/mL (ref <0.10)
Heroin Metabolite: NEGATIVE ng/mL (ref <1.0)
Hydrocodone: 96.7 ng/mL — ABNORMAL HIGH (ref <2.5)
Hydromorphone: NEGATIVE ng/mL (ref <2.5)
MARIJUANA: NEGATIVE ng/mL (ref <2.5)
MDMA: NEGATIVE ng/mL (ref <10)
Meprobamate: NEGATIVE ng/mL (ref <2.5)
Methadone: NEGATIVE ng/mL (ref <5.0)
Morphine: NEGATIVE ng/mL (ref <2.5)
Nicotine Metabolite: NEGATIVE ng/mL (ref <5.0)
Norhydrocodone: 3.6 ng/mL — ABNORMAL HIGH (ref <2.5)
Noroxycodone: NEGATIVE ng/mL (ref <2.5)
Opiates: POSITIVE ng/mL — AB (ref <2.5)
Oxycodone: NEGATIVE ng/mL (ref <2.5)
Oxymorphone: NEGATIVE ng/mL (ref <2.5)
Phencyclidine: NEGATIVE ng/mL (ref <10)
Tapentadol: NEGATIVE ng/mL (ref <5.0)
Tramadol: NEGATIVE ng/mL (ref <5.0)
Zolpidem: NEGATIVE ng/mL (ref <5.0)

## 2019-07-15 LAB — DRUG TOX ALC METAB W/CON, ORAL FLD: Alcohol Metabolite: NEGATIVE ng/mL (ref <25)

## 2019-07-17 ENCOUNTER — Telehealth: Payer: Self-pay

## 2019-07-17 NOTE — Telephone Encounter (Signed)
UDS RESULTS CONSISTENT WITH MEDICATIONS ON FILE  

## 2019-07-26 ENCOUNTER — Telehealth: Payer: Self-pay | Admitting: Internal Medicine

## 2019-07-26 NOTE — Progress Notes (Signed)
  Chronic Care Management   Outreach Note  07/26/2019 Name: Hannah Scott MRN: [id number] DOB: 12-18-1950  Referred by: Binnie Rail, MD Reason for referral : No chief complaint on file.   An unsuccessful telephone outreach was attempted today. The patient was referred to the pharmacist for assistance with care management and care coordination.   Follow Up Plan:   Raynicia Dukes [employer] Scheduler

## 2019-07-29 ENCOUNTER — Telehealth: Payer: Self-pay | Admitting: Internal Medicine

## 2019-07-29 ENCOUNTER — Other Ambulatory Visit: Payer: Self-pay | Admitting: Internal Medicine

## 2019-07-29 DIAGNOSIS — I2510 Atherosclerotic heart disease of native coronary artery without angina pectoris: Secondary | ICD-10-CM

## 2019-07-29 NOTE — Progress Notes (Signed)
°  Chronic Care Management   Note  07/29/2019 Name: Hannah Scott MRN: [id number] DOB: [birth year]  Hannah Scott is a 69 y.o. year old female who is a primary care patient of Burns, Claudina Lick, MD. I reached out to [place] by phone today in response to a referral sent by Hannah Scott's PCP, Binnie Rail, MD.   Hannah Scott was given information about Chronic Care Management services today including:  1. CCM service includes personalized support from designated clinical staff supervised by her physician, including individualized plan of care and coordination with other care providers 2. 24/7 contact phone numbers for assistance for urgent and routine care needs. 3. Service will only be billed when office clinical staff spend 20 minutes or more in a month to coordinate care. 4. Only one practitioner may furnish and bill the service in a calendar month. 5. The patient may stop CCM services at any time (effective at the end of the month) by phone call to the office staff.   Patient agreed to services and verbal consent obtained.   Follow up plan:   Raynicia Dukes [employer] Scheduler

## 2019-08-09 ENCOUNTER — Encounter: Payer: Self-pay | Admitting: Registered Nurse

## 2019-08-09 ENCOUNTER — Encounter: Payer: Medicare Other | Attending: Physical Medicine & Rehabilitation | Admitting: Registered Nurse

## 2019-08-09 ENCOUNTER — Other Ambulatory Visit: Payer: Self-pay

## 2019-08-09 VITALS — BP 101/62 | HR 63 | Temp 98.7°F | Ht 60.0 in | Wt 164.0 lb

## 2019-08-09 DIAGNOSIS — M24551 Contracture, right hip: Secondary | ICD-10-CM

## 2019-08-09 DIAGNOSIS — M48061 Spinal stenosis, lumbar region without neurogenic claudication: Secondary | ICD-10-CM | POA: Insufficient documentation

## 2019-08-09 DIAGNOSIS — M48062 Spinal stenosis, lumbar region with neurogenic claudication: Secondary | ICD-10-CM | POA: Diagnosis not present

## 2019-08-09 DIAGNOSIS — Z5181 Encounter for therapeutic drug level monitoring: Secondary | ICD-10-CM | POA: Diagnosis not present

## 2019-08-09 DIAGNOSIS — M1611 Unilateral primary osteoarthritis, right hip: Secondary | ICD-10-CM | POA: Diagnosis not present

## 2019-08-09 DIAGNOSIS — Z79891 Long term (current) use of opiate analgesic: Secondary | ICD-10-CM

## 2019-08-09 DIAGNOSIS — M961 Postlaminectomy syndrome, not elsewhere classified: Secondary | ICD-10-CM

## 2019-08-09 DIAGNOSIS — G894 Chronic pain syndrome: Secondary | ICD-10-CM | POA: Diagnosis not present

## 2019-08-09 DIAGNOSIS — M4126 Other idiopathic scoliosis, lumbar region: Secondary | ICD-10-CM | POA: Diagnosis not present

## 2019-08-09 MED ORDER — HYDROCODONE-ACETAMINOPHEN 10-325 MG PO TABS: 1.0000 | ORAL_TABLET | Freq: Three times a day (TID) | ORAL | 0 refills | Status: DC | PRN

## 2019-08-09 NOTE — Progress Notes (Signed)
Subjective:    Patient ID: Hannah Scott, female    DOB: 08-14-1950, 69 y.o.   MRN: [id number]  HPI: Hannah Scott is a 69 y.o. female who returns for follow up appointment for chronic pain and medication refill. She states her pain is located in her lower back. She rates her pain 6. Her current exercise regime is walking and performing stretching exercises.  [name] equivalent is 33.33 MME.  Last Oral Swab was Performed on 07/12/2019, it was consistent.   Pain Inventory Average Pain 7 Pain Right Now 6 My pain is constant, burning, tingling and aching  In the last 24 hours, has pain interfered with the following? General activity 10 Relation with others 10 Enjoyment of life 10 What TIME of day is your pain at its worst? all Sleep (in general) Poor  Pain is worse with: walking, standing and some activites Pain improves with: rest and medication Relief from Meds: 5  Mobility walk with assistance use a cane use a walker ability to climb steps?  yes do you drive?  yes  Function disabled: date disabled . I need assistance with the following:  household duties  Neuro/Psych weakness numbness tingling trouble walking depression  Prior Studies Any changes since last visit?  no  Physicians involved in your care Any changes since last visit?  no   Family History  Problem Relation Age of Onset  . Heart failure Mother   . Pneumonia Mother   . Hypertension Mother   . Heart disease Mother   . Stroke Maternal Grandfather   . Hypertension Maternal Grandfather   . Heart attack Father   . Heart disease Father   . Asthma Paternal Uncle        PAT UNCLES  . Heart attack Paternal Uncle    Social History   Socioeconomic History  . Marital status: Widowed    Spouse name: Not on file  . Number of children: Not on file  . Years of education: Not on file  . Highest education level: Not on file  Occupational History  . Not on file  Tobacco Use  . Smoking status:  Never Smoker  . Smokeless tobacco: Never Used  Substance and Sexual Activity  . Alcohol use: No    Alcohol/week: 0.0 standard drinks  . Drug use: No  . Sexual activity: Never    Birth control/protection: Surgical  Other Topics Concern  . Not on file  Social History Narrative  . Not on file   Social Determinants of Health   Financial Resource Strain:   . Difficulty of Paying Living Expenses:   Food Insecurity:   . Worried About Charity fundraiser in the Last Year:   . Arboriculturist in the Last Year:   Transportation Needs:   . Film/video editor (Medical):   [name] Lack of Transportation (Non-Medical):   Physical Activity:   . Days of Exercise per Week:   . Minutes of Exercise per Session:   Stress:   . Feeling of Stress :   Social Connections:   . Frequency of Communication with Friends and Family:   . Frequency of Social Gatherings with Friends and Family:   . Attends Religious Services:   . Active Member of Clubs or Organizations:   . Attends Archivist Meetings:   [name] Marital Status:    Past Surgical History:  Procedure Laterality Date  . ANKLE SURGERY Left    ligament  . APPENDECTOMY  1987   AT [facility]  . BACK SURGERY     Fusion  . BREAST LUMPECTOMY  2003   radiation on right  . BREAST LUMPECTOMY WITH RADIOACTIVE SEED LOCALIZATION Left 02/12/2016   Procedure: LEFT BREAST LUMPECTOMY WITH RADIOACTIVE SEED LOCALIZATION;  Surgeon: Excell Seltzer, MD;  Location: [facility];  Service: General;  Laterality: Left;  . CARDIOVASCULAR STRESS TEST  09/07/05   Nuclear, was negative  . CATARACT EXTRACTION Bilateral 01,03  . OOPHORECTOMY     LSO -RSO  . PELVIC LAPAROSCOPY  1989   RSO, LYSIS OF ADHESIONS  . TOTAL ABDOMINAL HYSTERECTOMY  1987   LSO, APPENDECTOMY  . TOTAL HIP ARTHROPLASTY Right 10/28/2013   dr Lorin Mercy  . TOTAL HIP ARTHROPLASTY Right 10/28/2013   Procedure: TOTAL HIP ARTHROPLASTY ANTERIOR APPROACH;  Surgeon: Marybelle Killings, MD;  Location:  [facility];  Service: Orthopedics;  Laterality: Right;  Right Total Hip Arthroplasty, Direct Anterior Approach   Past Medical History:  Diagnosis Date  . Anemia    hx  . Arthritis   . Asthma    PFTs, February, 2011, moderate obstructive disease with response to bronchodilators, normal lung volumes, moderate reduction in diffusing capacity  . Atrial septal aneurysm    Echo, 2008-not noted on 13 echo  . CAD (coronary artery disease)    90% distal LAD in the past  /   nuclear, 2008, no ischemia, ejection fraction 70%  . Cancer ([place]) 2002   DUCTAL CIS--S/P LUMPECTOMY, RADIATION AND 6 WEEKS OF TAMOXIFEN  . D-dimer, elevated    January, 2014  . Depression   . Ejection fraction    EF 60%, echo, October, 2008  . Elevated CPK    January, 2014  . Endometriosis 1989   RIGHT TUBE  . Endometriosis 1987   LEFT TUBE/OVARY W FOCAL IN-SITU ENDOMETRIAL ADENOCARCINOMA  . GERD (gastroesophageal reflux disease)    occ  . H/O hiatal hernia    ?  [name] Hyperlipidemia   . Hypertension   . Hypothyroidism    Patient has had in the past that she does not need treatment  . Kyphoscoliosis   . Obstructive airway disease ([place])   . Pinched nerve    lower back  . Shortness of breath   . UTI (lower urinary tract infection)    BP 101/62   Pulse 63   Temp 98.7 F (37.1 C)   Ht 5' (1.524 m)   Wt 164 lb (74.4 kg)   SpO2 92%   BMI 32.03 kg/m   Opioid Risk Score:   Fall Risk Score:  `1  Depression screen PHQ 2/9  Depression screen [facility] 2/9 09/11/2018 05/16/2018 01/17/2018 06/02/2017 05/03/2017 01/27/2017 01/10/2017  Decreased Interest 2 1 1 1 1 1  0  Down, Depressed, Hopeless 2 1 1 1 1 1 2   PHQ - 2 Score 4 2 2 2 2 2 2   Altered sleeping - - - - - - 1  Tired, decreased energy - - - - - - 1  Change in appetite - - - - - - 0  Feeling bad or failure about yourself  - - - - - - 1  Trouble concentrating - - - - - - 0  Moving slowly or fidgety/restless - - - - - - 0  Suicidal thoughts - - - - - - 0  PHQ-9 Score - - -  - - - 5  Difficult doing work/chores - - - - - - Not difficult at all  Some recent data might be hidden   Review of Systems  Musculoskeletal: Positive for back pain.  Neurological: Positive for weakness and numbness.       Tingling   Psychiatric/Behavioral: Positive for dysphoric mood.  All other systems reviewed and are negative.      Objective:   Physical Exam Vitals and nursing note reviewed.  Constitutional:      Appearance: Normal appearance.  Cardiovascular:     Rate and Rhythm: Normal rate and regular rhythm.     Pulses: Normal pulses.     Heart sounds: Normal heart sounds.  Pulmonary:     Effort: Pulmonary effort is normal.     Breath sounds: Normal breath sounds.  Musculoskeletal:     Cervical back: Normal range of motion and neck supple.     Comments: Normal Muscle Bulk and Muscle Testing Reveals:  Upper Extremities: Full ROM and Muscle Strength 5/5  Lumbar Paraspinal Tenderness: L-4-L-5 Lower Extremities: Full ROM and Muscle strength 5/5 Arises from Table slowly using walker for support Antalgic  Gait   Skin:    General: Skin is warm and dry.  Neurological:     Mental Status: She is alert and oriented to person, place, and time.  Psychiatric:        Mood and Affect: Mood normal.        Behavior: Behavior normal.           Assessment & Plan:  1.Lumbar post laminectomy syndromewith severe kyphoscoliosis thoracolumbar spine, s/p lumbar fusion/ 08/09/2019 Continue current medication regimen.Refilled: Hydrocodone 10/325mg  one tablet every8hours may take an extra tablet when pain is severe#100. We will continue the opioid monitoring program, this consists of regular clinic visits, examinations, urine drug screen, pill counts as well as use of [place] Controlled Substance Reporting system. 2. Right Hip OA: S/P Right Hip Replacement 10/28/2013.08/09/2019 3.Depression: Continue Cymbalta. Has Family Support and Friends. Counseling with  Doristine Bosworth.08/09/2019. 4.RightGreater Trochanteric Tenderness:No complaints today.Continue to Monitor. Continue with Ice and Heat Therapy.08/09/2019  F/U in 1 month  15 minutes of face to face patient care time was spent during this visit. All questions were encouraged and answered.

## 2019-08-14 DIAGNOSIS — H34832 Tributary (branch) retinal vein occlusion, left eye, with macular edema: Secondary | ICD-10-CM | POA: Diagnosis not present

## 2019-08-20 ENCOUNTER — Other Ambulatory Visit: Payer: Self-pay

## 2019-08-20 ENCOUNTER — Ambulatory Visit: Payer: Medicare Other

## 2019-08-20 DIAGNOSIS — E782 Mixed hyperlipidemia: Secondary | ICD-10-CM

## 2019-08-20 DIAGNOSIS — I10 Essential (primary) hypertension: Secondary | ICD-10-CM

## 2019-08-20 DIAGNOSIS — F3289 Other specified depressive episodes: Secondary | ICD-10-CM

## 2019-08-20 DIAGNOSIS — J4530 Mild persistent asthma, uncomplicated: Secondary | ICD-10-CM

## 2019-08-20 DIAGNOSIS — M810 Age-related osteoporosis without current pathological fracture: Secondary | ICD-10-CM

## 2019-08-20 NOTE — Patient Instructions (Addendum)
Visit Information  Thank you for meeting with me to discuss your medications! I look forward to working with you to achieve your health care goals. Below is a summary of what we talked about during the visit:  Goals Addressed            This Visit's Progress   . Asthma: reduce symptoms and costs       CARE PLAN ENTRY  Current Barriers:  . Chronic Disease Management support, education, and care coordination needs related to asthma . Current asthma regimen:   Breo Ellipta 100-25 mcg 1 puff daily  Duoneb 3 mL q4h PRN during exacerbation  Pharmacist Clinical Goal(s):  [name] Over the next 30 days, patient will work with PharmD and providers towards optimized asthma therapy  Interventions: . Comprehensive medication review performed. . Discussed cost of Breo and medication access during coverage gap ("donut hole") o May pursue patient assistance during donut hole  Patient Self Care Activities:  . Self administers medications as prescribed, Calls pharmacy for medication refills, and Calls provider office for new concerns or questions . Patient will contact pharmacist if [facility] cost increases  Initial goal documentation    . Cholesterol: goal LDL < 70       CARE PLAN ENTRY (see longitudinal plan of care for additional care plan information)  Current Barriers:  . Uncontrolled hyperlipidemia, complicated by HTN, CAD, prediabetes . Current antihyperlipidemic regimen:   Atorvastatin 20 mg daily . Previous antihyperlipidemic medications tried none . Most recent lipid panel:     Component Value Date/Time   CHOL 124 07/10/2019 1501   TRIG 88.0 07/10/2019 1501   HDL 49.10 07/10/2019 1501   CHOLHDL 3 07/10/2019 1501   VLDL 17.6 07/10/2019 1501   LDLCALC 58 07/10/2019 1501   LDLDIRECT 154.8 02/20/2009 1116   . ASCVD risk enhancing conditions: age >40,  HTN, history of MI  Pharmacist Clinical Goal(s):  [name] Over the next 30 days, patient will work with PharmD and providers towards  optimized antihyperlipidemic therapy to maintain LDL < 70  Interventions: . Comprehensive medication review performed; medication list updated in electronic medical record.  Bertram Savin care team collaboration (see longitudinal plan of care) . Discussed benefits of atorvastatin for reducing cardiovascular risk  Patient Self Care Activities:  . Patient will focus on medication adherence by pill box  Initial goal documentation     . Depression: improve symptoms       CARE PLAN ENTRY  Current Barriers:  . Chronic Disease Management support, education, and care coordination needs related to depression . Current regimen: duloxetine 60 mg daily  Pharmacist Clinical Goal(s):  [name] Over the next 30 days, patient will work with PharmD and providers towards optimized antidepressant therapy  Interventions: . Collaboration with provider re: medication management . Discussed adding bupropion vs increasing dose of duloxetine to address depressed mood o Patient would rather increase dose of current medication than start a new one right now  Patient Self Care Activities:  . Patient verbalizes understanding of plan to increase duloxetine to 90 mg daily, Self administers medications as prescribed, Calls pharmacy for medication refills, and Calls provider office for new concerns or questions  Initial goal documentation    . Hypertension: goal BP < 130/80       CARE PLAN ENTRY (see longitudinal plan of care for additional care plan information)  Current Barriers:  . Uncontrolled hypertension, complicated by HLD, CAD, asthma . Current antihypertensive regimen:   Benazepril 40 mg daily  HCTZ  25 mg daily  Metoprolol tartrate 100 mg (50 mg x 2) BID . Previous antihypertensives tried: amlodipine . Last practice recorded BP readings:  BP Readings from Last 3 Encounters:  08/09/19 101/62  07/12/19 (!) 107/58  07/10/19 (!) 162/84   . Current home BP readings: 123/68, 111/58 . Most  recent eGFR/CrCl: 68  Pharmacist Clinical Goal(s):  [name] Over the next 30 days, patient will work with PharmD and providers to optimize antihypertensive regimen  Interventions: . Inter-disciplinary care team collaboration (see longitudinal plan of care) . Comprehensive medication review performed; medication list updated in the electronic medical record.  . Patient is holding HCTZ about once a week due to low BP (<110/60) o Recommended to take 1/2 HCTZ dose (12.5 mg daily)  Patient Self Care Activities:  . Patient will continue to check BP daily , document, and provide at future appointments . Patient will focus on medication adherence . Patient will reduce hydrochlorothiazide to 12.5 mg (1/2 of 25 mg) daily  Initial goal documentation     . Osteoporosis: reduce fracture risk       CARE PLAN ENTRY  Current Barriers:  . Chronic Disease Management support, education, and care coordination needs related to osteoporosis . Current regimen: no medications  Pharmacist Clinical Goal(s):  [name] Over the next 30 days, patient will work with PharmD and providers towards optimized bone mineral density  Interventions: . Comprehensive medication review performed. . Discussed benefits of calcium and Vitamin D supplementation for bone health  Patient Self Care Activities:  . Patient will initiate calcium-Vitamin D supplement once daily to assess tolerability . If no side effects after 1 week, patient will increase to twice daily dosing  Initial goal documentation       Hannah Scott was given information about Chronic Care Management services today including:  1. CCM service includes personalized support from designated clinical staff supervised by her physician, including individualized plan of care and coordination with other care providers 2. 24/7 contact phone numbers for assistance for urgent and routine care needs. 3. Standard insurance, coinsurance, copays and deductibles apply for chronic care  management only during months in which we provide at least 20 minutes of these services. Most insurances cover these services at 100%, however patients may be responsible for any copay, coinsurance and/or deductible if applicable. This service may help you avoid the need for more expensive face-to-face services. 4. Only one practitioner may furnish and bill the service in a calendar month. 5. The patient may stop CCM services at any time (effective at the end of the month) by phone call to the office staff.  Patient agreed to services and verbal consent obtained.   The patient verbalized understanding of instructions provided today and agreed to receive a mailed copy of patient instruction and/or educational materials. Telephone follow up appointment with pharmacy team member scheduled for: 1 month  Charlene Brooke, PharmD Clinical Pharmacist [facility] protect organs, store calcium, anchor muscles, and support the whole body. Keeping your bones strong is important, especially as you get older. You can take actions to help keep your bones strong and healthy. Why is keeping my bones healthy important?  Keeping your bones healthy is important because your body constantly replaces bone cells. Cells get old, and new cells take their place. As we age, we lose bone cells because the body may not be able to make enough new cells to replace the old cells. The amount of  bone cells and bone tissue you have is referred to as bone mass. The higher your bone mass, the stronger your bones. The aging process leads to an overall loss of bone mass in the body, which can increase the likelihood of:  Joint pain and stiffness.  Broken bones.  A condition in which the bones become weak and brittle (osteoporosis). A large decline in bone mass occurs in older adults. In women, it occurs about the time of menopause. What actions can I take to keep my  bones healthy? Good health habits are important for maintaining healthy bones. This includes eating nutritious foods and exercising regularly. To have healthy bones, you need to get enough of the right minerals and vitamins. Most nutrition experts recommend getting these nutrients from the foods that you eat. In some cases, taking supplements may also be recommended. Doing certain types of exercise is also important for bone health. What are the nutritional recommendations for healthy bones?  Eating a well-balanced diet with plenty of calcium and vitamin D will help to protect your bones. Nutritional recommendations vary from person to person. Ask your health care provider what is healthy for you. Here are some general guidelines. Get enough calcium Calcium is the most important (essential) mineral for bone health. Most people can get enough calcium from their diet, but supplements may be recommended for people who are at risk for osteoporosis. Good sources of calcium include:  Dairy products, such as low-fat or nonfat milk, cheese, and yogurt.  Dark green leafy vegetables, such as bok choy and broccoli.  Calcium-fortified foods, such as orange juice, cereal, bread, soy beverages, and tofu products.  Nuts, such as almonds. Follow these recommended amounts for daily calcium intake:  Children, age 82-3: 700 mg.  Children, age 80-8: 1,000 mg.  Children, age 76-13: 1,300 mg.  Teens, age 807-18: 1,300 mg.  Adults, age 70-50: 1,000 mg.  Adults, age 58-70: ? Men: 1,000 mg. ? Women: 1,200 mg.  Adults, age 69 or older: 1,200 mg.  Pregnant and breastfeeding females: ? Teens: 1,300 mg. ? Adults: 1,000 mg. Get enough vitamin D Vitamin D is the most essential vitamin for bone health. It helps the body absorb calcium. Sunlight stimulates the skin to make vitamin D, so be sure to get enough sunlight. If you live in a cold climate or you do not get outside often, your health care provider may  recommend that you take vitamin D supplements. Good sources of vitamin D in your diet include:  Egg yolks.  Saltwater fish.  Milk and cereal fortified with vitamin D. Follow these recommended amounts for daily vitamin D intake:  Children and teens, age 82-18: 600 international units.  Adults, age 58 or younger: 400-800 international units.  Adults, age 84 or older: 800-1,000 international units. Get other important nutrients Other nutrients that are important for bone health include:  Phosphorus. This mineral is found in meat, poultry, dairy foods, nuts, and legumes. The recommended daily intake for adult men and adult women is 700 mg.  Magnesium. This mineral is found in seeds, nuts, dark green vegetables, and legumes. The recommended daily intake for adult men is 400-420 mg. For adult women, it is 310-320 mg.  Vitamin K. This vitamin is found in green leafy vegetables. The recommended daily intake is 120 mg for adult men and 90 mg for adult women. What type of physical activity is best for building and maintaining healthy bones? Weight-bearing and strength-building activities are important for building and maintaining  healthy bones. Weight-bearing activities cause muscles and bones to work against gravity. Strength-building activities increase the strength of the muscles that support bones. Weight-bearing and muscle-building activities include:  Walking and hiking.  Jogging and running.  Dancing.  Gym exercises.  Lifting weights.  Tennis and racquetball.  Climbing stairs.  Aerobics. Adults should get at least 30 minutes of moderate physical activity on most days. Children should get at least 60 minutes of moderate physical activity on most days. Ask your health care provider what type of exercise is best for you. How can I find out if my bone mass is low? Bone mass can be measured with an X-ray test called a bone mineral density (BMD) test. This test is recommended for all  women who are age 43 or older. It may also be recommended for:  Men who are age 26 or older.  People who are at risk for osteoporosis because of: ? Having bones that break easily. ? Having a long-term disease that weakens bones, such as kidney disease or rheumatoid arthritis. ? Having menopause earlier than normal. ? Taking medicine that weakens bones, such as steroids, thyroid hormones, or hormone treatment for breast cancer or prostate cancer. ? Smoking. ? Drinking three or more alcoholic drinks a day. If you find that you have a low bone mass, you may be able to prevent osteoporosis or further bone loss by changing your diet and lifestyle. Where can I find more information? For more information, check out the following websites:  [place]: AviationTales.fr  [employer] of Health: www.bones.SouthExposed.es  International Osteoporosis Foundation: Administrator.iofbonehealth.org Summary  The aging process leads to an overall loss of bone mass in the body, which can increase the likelihood of broken bones and osteoporosis.  Eating a well-balanced diet with plenty of calcium and vitamin D will help to protect your bones.  Weight-bearing and strength-building activities are also important for building and maintaining strong bones.  Bone mass can be measured with an X-ray test called a bone mineral density (BMD) test. This information is not intended to replace advice given to you by your health care provider. Make sure you discuss any questions you have with your health care provider. Document Revised: 05/08/2017 Document Reviewed: 05/08/2017 Elsevier Patient Education  2020 [employer].

## 2019-08-20 NOTE — Chronic Care Management (AMB) (Signed)
Chronic Care Management Pharmacy  Name: Hannah Scott  MRN: [id number] DOB: [birth year]  Chief Complaint/ HPI  Hannah Scott,  69 y.o. , female presents for their Initial CCM visit with the clinical pharmacist via telephone due to COVID-19 Pandemic.  PCP : Binnie Rail, MD  Their chronic conditions include: HTN, CAD (MI x 2 1999), HLD, asthma, migraine, depression, hypothyroidism, osteoporosis, osteoarthritis, Hx breast cancer, prediabetes  Lived in same place since 1971[birth year]. Husband passed away 6 years ago. Two sons, one lives 1/2 mile away, other lives in [place]. New granddaughter Ava Shirlee [place].  Office Visits: 07/10/19 Dr Quay Burow OV: started HCTZ  3 weeks prior. BP better controlled, pt held BP med one day due to low BP. BMP WNL.  06/17/19 Dr Quay Burow OV: BP 194/100, started HCTZ. Return 3 wks for BMP.  Consult Visit: 08/09/19 NP Marcello Moores (phys med): s/p lumbar fusion, refilled hydrocodone. Has office visit every month for opioid monitoring.  02/18/18 Dr Meda Coffee (cardiology): worsening sx of DOE, ordered coronary CTA. Increased metoprolol to 75 mg BID  Medications: Outpatient Encounter Medications as of 08/20/2019  Medication Sig  . albuterol (PROVENTIL HFA;VENTOLIN HFA) 108 (90 Base) MCG/ACT inhaler Inhale 2 puffs into the lungs every 4 (four) hours as needed for wheezing or shortness of breath.  [name] atorvastatin (LIPITOR) 20 MG tablet TAKE 1 TABLET BY MOUTH DAILY  . benazepril (LOTENSIN) 40 MG tablet TAKE 1 TABLET BY MOUTH DAILY  . BREO ELLIPTA 100-25 MCG/INH AEPB INHALE 1 PUFF INTO THE LUNGS DAILY  . DULoxetine (CYMBALTA) 60 MG capsule TAKE ONE CAPSULE EVERY DAY  . gabapentin (NEURONTIN) 600 MG tablet Take 1 tablet (600 mg total) by mouth at bedtime.  . hydrochlorothiazide (HYDRODIURIL) 25 MG tablet Take 1 tablet (25 mg total) by mouth daily.  [name] HYDROcodone-acetaminophen (NORCO) 10-325 MG tablet Take 1 tablet by mouth every 8 (eight) hours as needed.  [name] ipratropium-albuterol (DUONEB) 0.5-2.5  (3) MG/3ML SOLN Take 3 mLs by nebulization every 4 (four) hours as needed (wheezing or SOB). During exacerbation  . levothyroxine (SYNTHROID) 75 MCG tablet TAKE 1 TABLET BY MOUTH DAILY  . metoprolol tartrate (LOPRESSOR) 50 MG tablet Take 2 tablets (100 mg total) by mouth 2 (two) times daily.   No facility-administered encounter medications on file as of 08/20/2019.     Current Diagnosis/Assessment:  SDOH Interventions     Most Recent Value  SDOH Interventions  SDOH Interventions for the Following Domains  Financial Strain  Financial Strain Interventions  Other (Comment) [can pursue Breo PAP in donut hole]      Goals Addressed            This Visit's Progress   . Asthma: reduce symptoms and costs       CARE PLAN ENTRY  Current Barriers:  . Chronic Disease Management support, education, and care coordination needs related to asthma . Current asthma regimen:   Breo Ellipta 100-25 mcg 1 puff daily  Duoneb 3 mL q4h PRN during exacerbation  Pharmacist Clinical Goal(s):  [name] Over the next 30 days, patient will work with PharmD and providers towards optimized asthma therapy  Interventions: . Comprehensive medication review performed. . Discussed cost of Breo and medication access during coverage gap ("donut hole") o May pursue patient assistance during donut hole  Patient Self Care Activities:  . Self administers medications as prescribed, Calls pharmacy for medication refills, and Calls provider office for new concerns or questions . Patient will contact pharmacist if [facility] cost increases  Initial goal documentation    . Cholesterol: goal LDL < 70       CARE PLAN ENTRY (see longitudinal plan of care for additional care plan information)  Current Barriers:  . Uncontrolled hyperlipidemia, complicated by HTN, CAD, prediabetes . Current antihyperlipidemic regimen:   Atorvastatin 20 mg daily . Previous antihyperlipidemic medications tried none . Most recent lipid panel:      Component Value Date/Time   CHOL 124 07/10/2019 1501   TRIG 88.0 07/10/2019 1501   HDL 49.10 07/10/2019 1501   CHOLHDL 3 07/10/2019 1501   VLDL 17.6 07/10/2019 1501   LDLCALC 58 07/10/2019 1501   LDLDIRECT 154.8 02/20/2009 1116   . ASCVD risk enhancing conditions: age >47,  HTN, history of MI  Pharmacist Clinical Goal(s):  [name] Over the next 30 days, patient will work with PharmD and providers towards optimized antihyperlipidemic therapy to maintain LDL < 70  Interventions: . Comprehensive medication review performed; medication list updated in electronic medical record.  Bertram Savin care team collaboration (see longitudinal plan of care) . Discussed benefits of atorvastatin for reducing cardiovascular risk  Patient Self Care Activities:  . Patient will focus on medication adherence by pill box  Initial goal documentation     . Depression: improve symptoms       CARE PLAN ENTRY  Current Barriers:  . Chronic Disease Management support, education, and care coordination needs related to depression . Current regimen: duloxetine 60 mg daily  Pharmacist Clinical Goal(s):  [name] Over the next 30 days, patient will work with PharmD and providers towards optimized antidepressant therapy  Interventions: . Collaboration with provider re: medication management . Discussed adding bupropion vs increasing dose of duloxetine to address depressed mood o Patient would rather increase dose of current medication than start a new one right now  Patient Self Care Activities:  . Patient verbalizes understanding of plan to increase duloxetine to 90 mg daily, Self administers medications as prescribed, Calls pharmacy for medication refills, and Calls provider office for new concerns or questions  Initial goal documentation    . Hypertension: goal BP < 130/80       CARE PLAN ENTRY (see longitudinal plan of care for additional care plan information)  Current Barriers:  . Uncontrolled  hypertension, complicated by HLD, CAD, asthma . Current antihypertensive regimen:   Benazepril 40 mg daily  HCTZ 25 mg daily  Metoprolol tartrate 100 mg (50 mg x 2) BID . Previous antihypertensives tried: amlodipine . Last practice recorded BP readings:  BP Readings from Last 3 Encounters:  08/09/19 101/62  07/12/19 (!) 107/58  07/10/19 (!) 162/84   . Current home BP readings: 123/68, 111/58 . Most recent eGFR/CrCl: 68  Pharmacist Clinical Goal(s):  [name] Over the next 30 days, patient will work with PharmD and providers to optimize antihypertensive regimen  Interventions: . Inter-disciplinary care team collaboration (see longitudinal plan of care) . Comprehensive medication review performed; medication list updated in the electronic medical record.  . Patient is holding HCTZ about once a week due to low BP (<110/60) o Recommended to take 1/2 HCTZ dose (12.5 mg daily)  Patient Self Care Activities:  . Patient will continue to check BP daily , document, and provide at future appointments . Patient will focus on medication adherence . Patient will reduce hydrochlorothiazide to 12.5 mg (1/2 of 25 mg) daily  Initial goal documentation     . Osteoporosis: reduce fracture risk       CARE PLAN ENTRY  Current Barriers:  .  Chronic Disease Management support, education, and care coordination needs related to osteoporosis . Current regimen: no medications  Pharmacist Clinical Goal(s):  [name] Over the next 30 days, patient will work with PharmD and providers towards optimized bone mineral density  Interventions: . Comprehensive medication review performed. . Discussed benefits of calcium and Vitamin D supplementation for bone health  Patient Self Care Activities:  . Patient will initiate calcium-Vitamin D supplement once daily to assess tolerability . If no side effects after 1 week, patient will increase to twice daily dosing  Initial goal documentation       Hypertension   BP  today is:  <130/80  Office blood pressures are  BP Readings from Last 3 Encounters:  08/09/19 101/62  07/12/19 (!) 107/58  07/10/19 (!) 162/84   Kidney Function Lab Results  Component Value Date   CREATININE 0.82 07/10/2019   CREATININE 0.61 01/04/2019   CREATININE 0.61 04/13/2018      Component Value Date/Time   GFR 68.93 07/10/2019 1501   GFRNONAA 93 04/13/2018 1428   GFRAA 108 04/13/2018 1428    Patient has failed these meds in the past: amlodipine (edema) Patient is currently controlled on the following medications:   Benazepril 40 mg daily  HCTZ 25 mg daily  Metoprolol tartrate 100 mg (50 mg x 2) BID  Patient checks BP at home daily  Patient home BP readings are ranging: 123/68, yesterday 111/58 so held HCTZ  We discussed pt holds HCTZ dose about once a week due to low BP (<110/60). BP generally much better controlled since starting HCTZ.   Plan  Trial HCTZ 12.5 mg (1/2 of 25 mg) daily to see if this eliminates low BP issues Continue to monitor BP at home daily   Hyperlipidemia/CAD   Lipid Panel     Component Value Date/Time   CHOL 124 07/10/2019 1501   TRIG 88.0 07/10/2019 1501   HDL 49.10 07/10/2019 1501   CHOLHDL 3 07/10/2019 1501   VLDL 17.6 07/10/2019 1501   LDLCALC 58 07/10/2019 1501    CAD, Hx of MI x 2, last in 1999, no stents.  Patient has failed these meds in past: n/a Patient is currently controlled on the following medications:   Atorvastatin 20 mg daily  We discussed:  diet and exercise extensively  Plan  Continue current medications and control with diet and exercise   Asthma / Tobacco   Last spirometry score: n/a  Eosinophil count:   Lab Results  Component Value Date/Time   EOSPCT 11.2 (H) 01/04/2019 10:46 AM   EOSPCT 8.8 (H) 03/24/2009 09:41 AM  %                               Eos (Absolute):  Lab Results  Component Value Date/Time   EOSABS 0.6 01/04/2019 10:46 AM   EOSABS 0.5 03/24/2009 09:41 AM   Tobacco Status:   Social History   Tobacco Use  Smoking Status Never Smoker  Smokeless Tobacco Never Used   Patient has failed these meds in past: Qvar, Dulera Patient is currently controlled on the following medications:   Breo Ellipta 100-25 mcg 1 puff daily  Albuterol HFA prn - does not have one currently  Duoneb 3 mL q4h PRN during exacerbation  Using maintenance inhaler regularly? 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 Cost  of [facility] is reasonable once deductible is met.   Plan  Continue current medications   Hypothyroidism   TSH  Date Value Ref Range Status  07/10/2019 3.09 0.35 - 4.50 uIU/mL Final     Patient has failed these meds in past: n/a Patient is currently controlled on the following medications:   Levothyroxine 75 mcg dail  We discussed:  Pt takes med before breakfast and separate from all other meds. Denies s/sx of thyroid abnormalities.  Plan  Continue current medications   Depression    PHQ9 SCORE ONLY 09/11/2018 05/16/2018 01/17/2018  Score _0 Patient has failed these meds in past: citalopram, sertraline, Patient is currently uncontrolled on the following medications:   Duloxetine 60 mg daily  We discussed:  Pt reports she still feels significantly depressed. She has noticed an improvement with duloxetine, she has fewer crying spells, but still very down and unmotivated. Discussed bupropion as adjunctive therapy, however pt would rather try higher dose of current med than start a new medication. Discussed marginal benefit of duloxetine at higher doses than 60 mg, pt would like to try.   Plan  Recommend increase duloxetine to 90 mg (add 30 mg to current 60 mg capsule) Follow up in 1 month to assess effect   Osteoarthritis/Pain   Patient has failed these meds in past: n/a Patient is currently controlled on the following  medications:   Hydrocodone-APAP 10-325 mg -1 tablet q8h prn  Gabapentin 600 mg HS  Duloxetine 60 mg daily  We discussed:  Pt reports pain is controlled "enough" with current regimen. Follows with pain management.  Plan  Continue current medications   Osteopenia / Osteoporosis   Last DEXA Scan: 12/22/2016   T-Score femoral neck: -2.2  T-Score total hip: -2.8  10-year probability of major osteoporotic fracture: 12%  10-year probability of hip fracture: 2.6%  VitD level: 21.44 (07/12/2017:)  Patient is a candidate for pharmacologic treatment due to T-Score < -2.5 in total hip   Patient has failed these meds in past: Vitamin D 50,000 weekly, did not tolerate daily OTC vitamin D Patient is currently uncontrolled on the following medications: no medications  We discussed:  Recommend [phone] units of vitamin D daily. Recommend 1200 mg of calcium daily from dietary and supplemental sources. Patient is willing to try Vitamin D again since she hasn't tried in several years - she remembers she had GI upset with vitamin D previously, discussed that this is atypical side effect.  Plan  Patient will start calcium 500-Vitamin D 400 supplement once daily   Medication Management   Pt uses [facility] and [facility] for all medications Uses pill box? Yes Pt endorses 100% compliance  We discussed: Pt uses 2 different pharmacies because some medications are cheaper with independent despite non-preferred status per patient.   Plan  Continue current medication management strategy    Follow up: 1 month phone visit  Charlene Brooke, PharmD Clinical Pharmacist [facility] [phone]

## 2019-08-21 ENCOUNTER — Other Ambulatory Visit: Payer: Self-pay | Admitting: Internal Medicine

## 2019-08-21 NOTE — Telephone Encounter (Signed)
She had a recent visit with her pharmacist and discussed her depression.  We discussed possibly increasing the Cymbalta to 90 mg daily, which I am okay with.  Cymbalta 30 mg prescription is pending-please send to pharmacy of choice.  She will need to take a 60 mg pill and a 30 mg pill daily.  Because we are changing medication I would like to see her in the office in 3 weeks for follow-up.

## 2019-08-21 NOTE — Addendum Note (Signed)
Addended by: Karle Barr on: 08/21/2019 01:39 PM   Modules accepted: Orders

## 2019-08-22 ENCOUNTER — Other Ambulatory Visit: Payer: Self-pay | Admitting: Cardiology

## 2019-08-22 DIAGNOSIS — I10 Essential (primary) hypertension: Secondary | ICD-10-CM

## 2019-08-22 NOTE — Telephone Encounter (Signed)
LVM to call back in regards. 

## 2019-08-23 MED ORDER — DULOXETINE HCL 30 MG PO CPEP: 30.0000 mg | ORAL_CAPSULE | Freq: Every day | ORAL | 1 refills | Status: DC

## 2019-08-23 NOTE — Telephone Encounter (Signed)
Pt aware of response below. Rx sent and 3 week follow up made.

## 2019-09-05 ENCOUNTER — Other Ambulatory Visit: Payer: Self-pay | Admitting: Cardiology

## 2019-09-05 DIAGNOSIS — I10 Essential (primary) hypertension: Secondary | ICD-10-CM

## 2019-09-10 ENCOUNTER — Encounter: Payer: Self-pay | Admitting: Registered Nurse

## 2019-09-10 ENCOUNTER — Other Ambulatory Visit: Payer: Self-pay

## 2019-09-10 ENCOUNTER — Encounter: Payer: Medicare Other | Attending: Physical Medicine & Rehabilitation | Admitting: Registered Nurse

## 2019-09-10 VITALS — BP 110/73 | HR 76 | Ht 60.0 in | Wt 158.0 lb

## 2019-09-10 DIAGNOSIS — G894 Chronic pain syndrome: Secondary | ICD-10-CM

## 2019-09-10 DIAGNOSIS — M24551 Contracture, right hip: Secondary | ICD-10-CM | POA: Diagnosis not present

## 2019-09-10 DIAGNOSIS — Z5181 Encounter for therapeutic drug level monitoring: Secondary | ICD-10-CM | POA: Insufficient documentation

## 2019-09-10 DIAGNOSIS — M4126 Other idiopathic scoliosis, lumbar region: Secondary | ICD-10-CM | POA: Insufficient documentation

## 2019-09-10 DIAGNOSIS — M48062 Spinal stenosis, lumbar region with neurogenic claudication: Secondary | ICD-10-CM | POA: Diagnosis not present

## 2019-09-10 DIAGNOSIS — M961 Postlaminectomy syndrome, not elsewhere classified: Secondary | ICD-10-CM

## 2019-09-10 DIAGNOSIS — M48061 Spinal stenosis, lumbar region without neurogenic claudication: Secondary | ICD-10-CM | POA: Insufficient documentation

## 2019-09-10 DIAGNOSIS — M1611 Unilateral primary osteoarthritis, right hip: Secondary | ICD-10-CM | POA: Insufficient documentation

## 2019-09-10 DIAGNOSIS — Z79891 Long term (current) use of opiate analgesic: Secondary | ICD-10-CM

## 2019-09-10 MED ORDER — HYDROCODONE-ACETAMINOPHEN 10-325 MG PO TABS: 1.0000 | ORAL_TABLET | Freq: Three times a day (TID) | ORAL | 0 refills | Status: DC | PRN

## 2019-09-10 NOTE — Progress Notes (Signed)
/ ,  / Subjective:    Patient ID: Hannah Scott, female    DOB: [birth year], 69 y.o.   MRN: [id number]  HPI The patient is here for follow up of her depression.   BP at home [phone]. She is taking 1/2 of the hctz daily.     On 4/30 we increased her cymbalta to 90 mg daily after she discussed this with the pharmacist.  She has not seen any difference - she feels more tired and will probably will go back to 60 mg.  She does feel tired, depressed and has no energy.     Her sleep is not good.  She has chronic fatigue.  She maybe gets 4 hours at night.  Medications and allergies reviewed with patient and updated if appropriate.  Patient Active Problem List   Diagnosis Date Noted  . Murmur, cardiac 01/16/2018  . Moderate persistent asthma with exacerbation 12/13/2017  . Osteoporosis 01/07/2017  . Acute bronchitis 10/17/2016  . Prediabetes 07/07/2016  . History of breast cancer 01/06/2016  . Intraductal papilloma of left breast 01/06/2016  . Essential hypertension 06/08/2015  . DOE (dyspnea on exertion) 06/08/2015  . Fatigue 05/20/2015  . Back pain 04/22/2015  . Anemia due to blood loss 11/27/2013  . Elevated CPK   . Multinodular goiter 12/01/2011  . Asthma   . Hypothyroidism   . CAD (coronary artery disease)   . Atrial septal aneurysm   . Hyperlipidemia   . Scoliosis   . Spinal stenosis, lumbar region, with neurogenic claudication 07/01/2011  . Lumbar postlaminectomy syndrome 07/01/2011  . Osteoarthritis of right hip 07/01/2011  . Contracture of right hip 07/01/2011  . Endometriosis   . Depression 12/24/2009  . Migraine headache 05/19/2006    Current Outpatient Medications on File Prior to Visit  Medication Sig Dispense Refill  . albuterol (PROVENTIL HFA;VENTOLIN HFA) 108 (90 Base) MCG/ACT inhaler Inhale 2 puffs into the lungs every 4 (four) hours as needed for wheezing or shortness of breath. 1 Inhaler 0  . atorvastatin (LIPITOR) 20 MG tablet TAKE 1 TABLET BY MOUTH  DAILY 90 tablet 3  . benazepril (LOTENSIN) 40 MG tablet TAKE 1 TABLET BY MOUTH DAILY 90 tablet 2  . BREO ELLIPTA 100-25 MCG/INH AEPB INHALE 1 PUFF INTO THE LUNGS DAILY 60 each 1  . DULoxetine (CYMBALTA) 30 MG capsule Take 1 capsule (30 mg total) by mouth daily. Take an addition to 60 mg pill for a total of 90 mg daily. 90 capsule 1  . DULoxetine (CYMBALTA) 60 MG capsule TAKE ONE CAPSULE EVERY DAY 90 capsule 1  . gabapentin (NEURONTIN) 600 MG tablet Take 1 tablet (600 mg total) by mouth at bedtime. 30 tablet 5  . hydrochlorothiazide (HYDRODIURIL) 25 MG tablet Take 1 tablet (25 mg total) by mouth daily. 90 tablet 3  . HYDROcodone-acetaminophen (NORCO) 10-325 MG tablet Take 1 tablet by mouth every 8 (eight) hours as needed. 100 tablet 0  . ipratropium-albuterol (DUONEB) 0.5-2.5 (3) MG/3ML SOLN Take 3 mLs by nebulization every 4 (four) hours as needed (wheezing or SOB). During exacerbation 360 mL 1  . levothyroxine (SYNTHROID) 75 MCG tablet TAKE 1 TABLET BY MOUTH DAILY 90 tablet 0  . metoprolol tartrate (LOPRESSOR) 50 MG tablet Take 2 tablets (100 mg total) by mouth 2 (two) times daily. Please make overdue follow up appt for further refills. [phone] 3rd and final attempt 60 tablet 0   No current facility-administered medications on file prior to visit.    Past Medical  History:  Diagnosis Date  . Anemia    hx  . Arthritis   . Asthma    PFTs, February, 2011, moderate obstructive disease with response to bronchodilators, normal lung volumes, moderate reduction in diffusing capacity  . Atrial septal aneurysm    Echo, 2008-not noted on 13 echo  . CAD (coronary artery disease)    90% distal LAD in the past  /   nuclear, 2008, no ischemia, ejection fraction 70%  . Cancer ([place]) 2002   DUCTAL CIS--S/P LUMPECTOMY, RADIATION AND 6 WEEKS OF TAMOXIFEN  . D-dimer, elevated    January, 2014  . Depression   . Ejection fraction    EF 60%, echo, October, 2008  . Elevated CPK    January, 2014  .  Endometriosis 1989   RIGHT TUBE  . Endometriosis 1987   LEFT TUBE/OVARY W FOCAL IN-SITU ENDOMETRIAL ADENOCARCINOMA  . GERD (gastroesophageal reflux disease)    occ  . H/O hiatal hernia    ?  [name] Hyperlipidemia   . Hypertension   . Hypothyroidism    Patient has had in the past that she does not need treatment  . Kyphoscoliosis   . Obstructive airway disease ([place])   . Pinched nerve    lower back  . Shortness of breath   . UTI (lower urinary tract infection)     Past Surgical History:  Procedure Laterality Date  . ANKLE SURGERY Left    ligament  . APPENDECTOMY  1987   AT [facility]  . BACK SURGERY     Fusion  . BREAST LUMPECTOMY  2003   radiation on right  . BREAST LUMPECTOMY WITH RADIOACTIVE SEED LOCALIZATION Left 02/12/2016   Procedure: LEFT BREAST LUMPECTOMY WITH RADIOACTIVE SEED LOCALIZATION;  Surgeon: Excell Seltzer, MD;  Location: [facility];  Service: General;  Laterality: Left;  . CARDIOVASCULAR STRESS TEST  09/07/05   Nuclear, was negative  . CATARACT EXTRACTION Bilateral 01,03  . OOPHORECTOMY     LSO -RSO  . PELVIC LAPAROSCOPY  1989   RSO, LYSIS OF ADHESIONS  . TOTAL ABDOMINAL HYSTERECTOMY  1987   LSO, APPENDECTOMY  . TOTAL HIP ARTHROPLASTY Right 10/28/2013   dr Lorin Mercy  . TOTAL HIP ARTHROPLASTY Right 10/28/2013   Procedure: TOTAL HIP ARTHROPLASTY ANTERIOR APPROACH;  Surgeon: Marybelle Killings, MD;  Location: [facility];  Service: Orthopedics;  Laterality: Right;  Right Total Hip Arthroplasty, Direct Anterior Approach    Social History   Socioeconomic History  . Marital status: Widowed    Spouse name: Not on file  . Number of children: Not on file  . Years of education: Not on file  . Highest education level: Not on file  Occupational History  . Not on file  Tobacco Use  . Smoking status: Never Smoker  . Smokeless tobacco: Never Used  Substance and Sexual Activity  . Alcohol use: No    Alcohol/week: 0.0 standard drinks  . Drug use: No  . Sexual  activity: Never    Birth control/protection: Surgical  Other Topics Concern  . Not on file  Social History Narrative  . Not on file   Social Determinants of Health   Financial Resource Strain: Low Risk   . Difficulty of Paying Living Expenses: Not very hard  Food Insecurity:   . Worried About Charity fundraiser in the Last Year:   . Arboriculturist in the Last Year:   Transportation Needs:   . Film/video editor (Medical):   [name]  Lack of Transportation (Non-Medical):   Physical Activity:   . Days of Exercise per Week:   . Minutes of Exercise per Session:   Stress:   . Feeling of Stress :   Social Connections:   . Frequency of Communication with Friends and Family:   . Frequency of Social Gatherings with Friends and Family:   . Attends Religious Services:   . Active Member of Clubs or Organizations:   . Attends Archivist Meetings:   [name] Marital Status:     Family History  Problem Relation Age of Onset  . Heart failure Mother   . Pneumonia Mother   . Hypertension Mother   . Heart disease Mother   . Stroke Maternal Grandfather   . Hypertension Maternal Grandfather   . Heart attack Father   . Heart disease Father   . Asthma Paternal Uncle        PAT UNCLES  . Heart attack Paternal Uncle     Review of Systems  Constitutional: Positive for fatigue.  HENT: Positive for postnasal drip.   Respiratory: Positive for wheezing. Negative for cough and shortness of breath.   Cardiovascular: Negative for chest pain, palpitations and leg swelling.  Neurological: Positive for light-headedness (with low BP). Negative for headaches.  Psychiatric/Behavioral: Positive for sleep disturbance.       Objective:   Vitals:   09/11/19 1356  BP: 122/80  Pulse: 95  Resp: 16  Temp: 98.2 F (36.8 C)  SpO2: 96%   BP Readings from Last 3 Encounters:  09/11/19 122/80  09/11/19 122/80  09/10/19 110/73   Wt Readings from Last 3 Encounters:  09/11/19 165 lb (74.8 kg)    09/11/19 165 lb (74.8 kg)  09/10/19 158 lb (71.7 kg)   Body mass index is 32.22 kg/m.   Physical Exam    Constitutional: Appears well-developed and well-nourished. No distress.  HENT:  Head: Normocephalic and atraumatic.  Neck: Neck supple. No tracheal deviation present. No thyromegaly present.  No cervical lymphadenopathy Cardiovascular: Normal rate, regular rhythm and normal heart sounds.   No murmur heard. No carotid bruit .  No edema Pulmonary/Chest: Effort normal and breath sounds normal. No respiratory distress. No has no wheezes. No rales.  Skin: Skin is warm and dry. Not diaphoretic.  Psychiatric: depressed mood and affect. Behavior is normal.      Assessment & Plan:    See Problem List for Assessment and Plan of chronic medical problems.    This visit occurred during the SARS-CoV-2 public health emergency.  Safety protocols were in place, including screening questions prior to the visit, additional usage of staff PPE, and extensive cleaning of exam room while observing appropriate contact time as indicated for disinfecting solutions.

## 2019-09-10 NOTE — Progress Notes (Signed)
Subjective:    Patient ID: Hannah Scott, female    DOB: 1950/12/07, 69 y.o.   MRN: [id number]  HPI: Hannah Scott is a 69 y.o. female whose appointment was changed to a telephone call due to gas shortage. She agrees with tele-health visit and verbalizes understanding..She states her pain is located in her lower back. She rates her pain 6. Her  current exercise regime is walking and performing stretching exercises.  Geryl Rankins CMA asked The Health and History Questions. This Provider and Mancel Parsons verified we were speaking with the correct person using two identifiers.    Hannah Scott Morphine equivalent is 33.33 MME.  Last Oral Swab was Performed on 07/12/2019, it was consistent.    Pain Inventory Average Pain 6 Pain Right Now 6 My pain is intermittent, constant, burning, tingling and aching  In the last 24 hours, has pain interfered with the following? General activity 10 Relation with others 10 Enjoyment of life 10 What TIME of day is your pain at its worst? all Sleep (in general) Poor  Pain is worse with: walking and standing Pain improves with: rest and medication Relief from Meds: 5  Mobility walk with assistance use a cane use a walker ability to climb steps?  yes do you drive?  yes  Function disabled: date disabled .  Neuro/Psych weakness tingling trouble walking depression  Prior Studies Any changes since last visit?  no  Physicians involved in your care Any changes since last visit?  no   Family History  Problem Relation Age of Onset  . Heart failure Mother   . Pneumonia Mother   . Hypertension Mother   . Heart disease Mother   . Stroke Maternal Grandfather   . Hypertension Maternal Grandfather   . Heart attack Father   . Heart disease Father   . Asthma Paternal Uncle        PAT UNCLES  . Heart attack Paternal Uncle    Social History   Socioeconomic History  . Marital status: Widowed    Spouse name: Not on file  . Number of children:  Not on file  . Years of education: Not on file  . Highest education level: Not on file  Occupational History  . Not on file  Tobacco Use  . Smoking status: Never Smoker  . Smokeless tobacco: Never Used  Substance and Sexual Activity  . Alcohol use: No    Alcohol/week: 0.0 standard drinks  . Drug use: No  . Sexual activity: Never    Birth control/protection: Surgical  Other Topics Concern  . Not on file  Social History Narrative  . Not on file   Social Determinants of Health   Financial Resource Strain: Low Risk   . Difficulty of Paying Living Expenses: Not very hard  Food Insecurity:   . Worried About Charity fundraiser in the Last Year:   . Arboriculturist in the Last Year:   Transportation Needs:   . Film/video editor (Medical):   [name] Lack of Transportation (Non-Medical):   Physical Activity:   . Days of Exercise per Week:   . Minutes of Exercise per Session:   Stress:   . Feeling of Stress :   Social Connections:   . Frequency of Communication with Friends and Family:   . Frequency of Social Gatherings with Friends and Family:   . Attends Religious Services:   . Active Member of Clubs or Organizations:   . Attends Club  or Organization Meetings:   [name] Marital Status:    Past Surgical History:  Procedure Laterality Date  . ANKLE SURGERY Left    ligament  . APPENDECTOMY  1987   AT [facility]  . BACK SURGERY     Fusion  . BREAST LUMPECTOMY  2003   radiation on right  . BREAST LUMPECTOMY WITH RADIOACTIVE SEED LOCALIZATION Left 02/12/2016   Procedure: LEFT BREAST LUMPECTOMY WITH RADIOACTIVE SEED LOCALIZATION;  Surgeon: Excell Seltzer, MD;  Location: [facility];  Service: General;  Laterality: Left;  . CARDIOVASCULAR STRESS TEST  09/07/05   Nuclear, was negative  . CATARACT EXTRACTION Bilateral 01,03  . OOPHORECTOMY     LSO -RSO  . PELVIC LAPAROSCOPY  1989   RSO, LYSIS OF ADHESIONS  . TOTAL ABDOMINAL HYSTERECTOMY  1987   LSO, APPENDECTOMY  .  TOTAL HIP ARTHROPLASTY Right 10/28/2013   dr Lorin Mercy  . TOTAL HIP ARTHROPLASTY Right 10/28/2013   Procedure: TOTAL HIP ARTHROPLASTY ANTERIOR APPROACH;  Surgeon: Marybelle Killings, MD;  Location: [facility];  Service: Orthopedics;  Laterality: Right;  Right Total Hip Arthroplasty, Direct Anterior Approach   Past Medical History:  Diagnosis Date  . Anemia    hx  . Arthritis   . Asthma    PFTs, February, 2011, moderate obstructive disease with response to bronchodilators, normal lung volumes, moderate reduction in diffusing capacity  . Atrial septal aneurysm    Echo, 2008-not noted on 13 echo  . CAD (coronary artery disease)    90% distal LAD in the past  /   nuclear, 2008, no ischemia, ejection fraction 70%  . Cancer ([place]) 2002   DUCTAL CIS--S/P LUMPECTOMY, RADIATION AND 6 WEEKS OF TAMOXIFEN  . D-dimer, elevated    January, 2014  . Depression   . Ejection fraction    EF 60%, echo, October, 2008  . Elevated CPK    January, 2014  . Endometriosis 1989   RIGHT TUBE  . Endometriosis 1987   LEFT TUBE/OVARY W FOCAL IN-SITU ENDOMETRIAL ADENOCARCINOMA  . GERD (gastroesophageal reflux disease)    occ  . H/O hiatal hernia    ?  [name] Hyperlipidemia   . Hypertension   . Hypothyroidism    Patient has had in the past that she does not need treatment  . Kyphoscoliosis   . Obstructive airway disease ([place])   . Pinched nerve    lower back  . Shortness of breath   . UTI (lower urinary tract infection)    There were no vitals taken for this visit.  Opioid Risk Score:   Fall Risk Score:  `1  Depression screen PHQ 2/9  Depression screen [facility] 2/9 09/11/2018 05/16/2018 01/17/2018 06/02/2017 05/03/2017 01/27/2017 01/10/2017  Decreased Interest 2 1 1 1 1 1  0  Down, Depressed, Hopeless 2 1 1 1 1 1 2   PHQ - 2 Score 4 2 2 2 2 2 2   Altered sleeping - - - - - - 1  Tired, decreased energy - - - - - - 1  Change in appetite - - - - - - 0  Feeling bad or failure about yourself  - - - - - - 1  Trouble concentrating - - - -  - - 0  Moving slowly or fidgety/restless - - - - - - 0  Suicidal thoughts - - - - - - 0  PHQ-9 Score - - - - - - 5  Difficult doing work/chores - - - - - - Not difficult  at all  Some recent data might be hidden    Review of Systems  Constitutional: Negative.   HENT: Negative.   Eyes: Negative.   Respiratory: Negative.   Cardiovascular: Negative.   Gastrointestinal: Negative.   Endocrine: Negative.   Genitourinary: Negative.   Musculoskeletal: Positive for back pain.  Skin: Negative.   Allergic/Immunologic: Negative.   Neurological: Positive for weakness.       Tingling  Hematological: Negative.   Psychiatric/Behavioral: Negative.        Objective:   Physical Exam Vitals and nursing note reviewed.  Constitutional:      Appearance: Normal appearance.  Cardiovascular:     Rate and Rhythm: Normal rate and regular rhythm.     Pulses: Normal pulses.     Heart sounds: Normal heart sounds.  Pulmonary:     Effort: Pulmonary effort is normal.     Breath sounds: Normal breath sounds.  Musculoskeletal:     Cervical back: Normal range of motion and neck supple.     Comments: No Physical Exam Performed: Virtual Visit  Skin:    General: Skin is warm and dry.  Neurological:     Mental Status: She is alert and oriented to person, place, and time.  Psychiatric:        Mood and Affect: Mood normal.        Behavior: Behavior normal.           Assessment & Plan:  1.Lumbar post laminectomy syndromewith severe kyphoscoliosis thoracolumbar spine, s/p lumbar fusion/ 09/10/2019 Continue current medication regimen.Refilled: Hydrocodone 10/325mg  one tablet every8hours may take an extra tablet when pain is severe#100. We will continue the opioid monitoring program, this consists of regular clinic visits, examinations, urine drug screen, pill counts as well as use of [place] Controlled Substance Reporting system. 2. Right Hip OA: S/P Right Hip Replacement  10/28/2013.09/10/2019 3.Depression: Continue Cymbalta. Has Family Support and Friends. Counseling with Doristine Bosworth.09/10/2019. 4.RightGreater Trochanteric Tenderness:No complaints today.Continue to Monitor. Continue with Ice and Heat Therapy.09/10/2019  F/U in 1 month  Tele-Health Visit Establish Patient Location of Patient: In Her Home Location of provider: In the Office Total Time Spent: 10 Minutes

## 2019-09-11 ENCOUNTER — Encounter: Payer: Self-pay | Admitting: Internal Medicine

## 2019-09-11 ENCOUNTER — Other Ambulatory Visit: Payer: Self-pay

## 2019-09-11 ENCOUNTER — Ambulatory Visit (INDEPENDENT_AMBULATORY_CARE_PROVIDER_SITE_OTHER): Payer: Medicare Other

## 2019-09-11 ENCOUNTER — Ambulatory Visit (INDEPENDENT_AMBULATORY_CARE_PROVIDER_SITE_OTHER): Payer: Medicare Other | Admitting: Internal Medicine

## 2019-09-11 VITALS — BP 122/80 | HR 95 | Temp 98.2°F | Resp 16 | Ht 60.0 in | Wt 165.0 lb

## 2019-09-11 DIAGNOSIS — E039 Hypothyroidism, unspecified: Secondary | ICD-10-CM

## 2019-09-11 DIAGNOSIS — Z0000 Encounter for general adult medical examination without abnormal findings: Secondary | ICD-10-CM

## 2019-09-11 DIAGNOSIS — G4709 Other insomnia: Secondary | ICD-10-CM | POA: Diagnosis not present

## 2019-09-11 DIAGNOSIS — I10 Essential (primary) hypertension: Secondary | ICD-10-CM

## 2019-09-11 DIAGNOSIS — G4700 Insomnia, unspecified: Secondary | ICD-10-CM | POA: Insufficient documentation

## 2019-09-11 DIAGNOSIS — F3289 Other specified depressive episodes: Secondary | ICD-10-CM | POA: Diagnosis not present

## 2019-09-11 LAB — COMPREHENSIVE METABOLIC PANEL
ALT: 12 U/L (ref 0–35)
AST: 21 U/L (ref 0–37)
Albumin: 4.2 g/dL (ref 3.5–5.2)
Alkaline Phosphatase: 89 U/L (ref 39–117)
BUN: 16 mg/dL (ref 6–23)
CO2: 38 mEq/L — ABNORMAL HIGH (ref 19–32)
Calcium: 9.8 mg/dL (ref 8.4–10.5)
Chloride: 99 mEq/L (ref 96–112)
Creatinine, Ser: 0.84 mg/dL (ref 0.40–1.20)
GFR: 67.01 mL/min (ref >60.00)
Glucose, Bld: 101 mg/dL — ABNORMAL HIGH (ref 70–99)
Potassium: 5.6 mEq/L — ABNORMAL HIGH (ref 3.5–5.1)
Sodium: 143 mEq/L (ref 135–145)
Total Bilirubin: 0.7 mg/dL (ref 0.2–1.2)
Total Protein: 6.4 g/dL (ref 6.0–8.3)

## 2019-09-11 LAB — TSH: TSH: 1.37 u[IU]/mL (ref 0.35–4.50)

## 2019-09-11 MED ORDER — MIRTAZAPINE 15 MG PO TABS: 15.0000 mg | ORAL_TABLET | Freq: Every day | ORAL | 5 refills | Status: DC

## 2019-09-11 MED ORDER — BENAZEPRIL HCL 40 MG PO TABS: 20.0000 mg | ORAL_TABLET | Freq: Every day | ORAL | 2 refills | Status: DC

## 2019-09-11 NOTE — Assessment & Plan Note (Signed)
Chronic  Chronic fatigue, which is not new - Clinically euthyroid Check tsh  Titrate med dose if needed

## 2019-09-11 NOTE — Assessment & Plan Note (Signed)
Chronic Sleep is poor - only getting about 4 hrs at night Will try remeron at night for depression and sleep once off cymbalta

## 2019-09-11 NOTE — Patient Instructions (Signed)
Hannah Scott , Thank you for taking time to come for your Medicare Wellness Visit. I appreciate your ongoing commitment to your health goals. Please review the following plan we discussed and let me know if I can assist you in the future.   Screening recommendations/referrals: Colorectal Screening: overdue; referral to [facility] Mammogram: last done on 01/03/2018; will schedule for 2021 Bone Density: last done on 12/22/2016; will schedule for 2021  Vision and Dental Exams: Recommended annual ophthalmology exams for early detection of glaucoma and other disorders of the eye Recommended annual dental exams for proper oral hygiene  Vaccinations: Influenza vaccine: 01/04/2019 Pneumococcal vaccine: completed; 05/06/2015, 07/06/2016 Tdap vaccine: 01/09/2019; Due every 10 years. Please call your insurance company to determine your out of pocket expense. You may also receive this vaccine at your local pharmacy or Health Dept. Shingles vaccine: declined Covid vaccine: completed; Moderna 06/21/2019, 07/24/2019  Advanced directives: Advance directives discussed with you today. You have declined to receive documents for completion.   Goals:  Recommend to drink at least 6-8 8oz glasses of water per day.  Recommend to exercise for at least 150 minutes per week.  Recommend to remove any items from the home that may cause slips or trips.  Recommend to decrease portion sizes by eating 3 small healthy meals and at least 2 healthy snacks per day.  Recommend to begin DASH diet as directed below  Recommend to continue efforts to reduce smoking habits until no longer smoking. Smoking Cessation literature is attached below.  Next appointment: Please schedule your Annual Wellness Visit with your Nurse Health Advisor in one year.  Preventive Care 70 Years and Older, Female Preventive care refers to lifestyle choices and visits with your health care provider that can promote health and wellness. What  does preventive care include?  A yearly physical exam. This is also called an annual well check.  Dental exams once or twice a year.  Routine eye exams. Ask your health care provider how often you should have your eyes checked.  Personal lifestyle choices, including:  Daily care of your teeth and gums.  Regular physical activity.  Eating a healthy diet.  Avoiding tobacco and drug use.  Limiting alcohol use.  Practicing safe sex.  Taking low-dose aspirin every day if recommended by your health care provider.  Taking vitamin and mineral supplements as recommended by your health care provider. What happens during an annual well check? The services and screenings done by your health care provider during your annual well check will depend on your age, overall health, lifestyle risk factors, and family history of disease. Counseling  Your health care provider may ask you questions about your:  Alcohol use.  Tobacco use.  Drug use.  Emotional well-being.  Home and relationship well-being.  Sexual activity.  Eating habits.  History of falls.  Memory and ability to understand (cognition).  Work and work Statistician.  Reproductive health. Screening  You may have the following tests or measurements:  Height, weight, and BMI.  Blood pressure.  Lipid and cholesterol levels. These may be checked every 5 years, or more frequently if you are over 85 years old.  Skin check.  Lung cancer screening. You may have this screening every year starting at age 54 if you have a 30-pack-year history of smoking and currently smoke or have quit within the past 15 years.  Fecal occult blood test (FOBT) of the stool. You may have this test every year starting at age 56.  Flexible sigmoidoscopy  or colonoscopy. You may have a sigmoidoscopy every 5 years or a colonoscopy every 10 years starting at age 48.  Hepatitis C blood test.  Hepatitis B blood test.  Sexually transmitted  disease (STD) testing.  Diabetes screening. This is done by checking your blood sugar (glucose) after you have not eaten for a while (fasting). You may have this done every 1-3 years.  Bone density scan. This is done to screen for osteoporosis. You may have this done starting at age 17.  Mammogram. This may be done every 1-2 years. Talk to your health care provider about how often you should have regular mammograms. Talk with your health care provider about your test results, treatment options, and if necessary, the need for more tests. Vaccines  Your health care provider may recommend certain vaccines, such as:  Influenza vaccine. This is recommended every year.  Tetanus, diphtheria, and acellular pertussis (Tdap, Td) vaccine. You may need a Td booster every 10 years.  Zoster vaccine. You may need this after age 44.  Pneumococcal 13-valent conjugate (PCV13) vaccine. One dose is recommended after age 44.  Pneumococcal polysaccharide (PPSV23) vaccine. One dose is recommended after age 41. Talk to your health care provider about which screenings and vaccines you need and how often you need them. This information is not intended to replace advice given to you by your health care provider. Make sure you discuss any questions you have with your health care provider. Document Released: 05/08/2015 Document Revised: 12/30/2015 Document Reviewed: 02/10/2015 Elsevier Interactive Patient Education  2017 [employer] Prevention in the Home Falls can cause injuries. They can happen to people of all ages. There are many things you can do to make your home safe and to help prevent falls. What can I do on the outside of my home?  Regularly fix the edges of walkways and driveways and fix any cracks.  Remove anything that might make you trip as you walk through a door, such as a raised step or threshold.  Trim any bushes or trees on the path to your home.  Use bright outdoor  lighting.  Clear any walking paths of anything that might make someone trip, such as rocks or tools.  Regularly check to see if handrails are loose or broken. Make sure that both sides of any steps have handrails.  Any raised decks and porches should have guardrails on the edges.  Have any leaves, snow, or ice cleared regularly.  Use sand or salt on walking paths during winter.  Clean up any spills in your garage right away. This includes oil or grease spills. What can I do in the bathroom?  Use night lights.  Install grab bars by the toilet and in the tub and shower. Do not use towel bars as grab bars.  Use non-skid mats or decals in the tub or shower.  If you need to sit down in the shower, use a plastic, non-slip stool.  Keep the floor dry. Clean up any water that spills on the floor as soon as it happens.  Remove soap buildup in the tub or shower regularly.  Attach bath mats securely with double-sided non-slip rug tape.  Do not have throw rugs and other things on the floor that can make you trip. What can I do in the bedroom?  Use night lights.  Make sure that you have a light by your bed that is easy to reach.  Do not use any sheets or blankets that are  too big for your bed. They should not hang down onto the floor.  Have a firm chair that has side arms. You can use this for support while you get dressed.  Do not have throw rugs and other things on the floor that can make you trip. What can I do in the kitchen?  Clean up any spills right away.  Avoid walking on wet floors.  Keep items that you use a lot in easy-to-reach places.  If you need to reach something above you, use a strong step stool that has a grab bar.  Keep electrical cords out of the way.  Do not use floor polish or wax that makes floors slippery. If you must use wax, use non-skid floor wax.  Do not have throw rugs and other things on the floor that can make you trip. What can I do with my  stairs?  Do not leave any items on the stairs.  Make sure that there are handrails on both sides of the stairs and use them. Fix handrails that are broken or loose. Make sure that handrails are as long as the stairways.  Check any carpeting to make sure that it is firmly attached to the stairs. Fix any carpet that is loose or worn.  Avoid having throw rugs at the top or bottom of the stairs. If you do have throw rugs, attach them to the floor with carpet tape.  Make sure that you have a light switch at the top of the stairs and the bottom of the stairs. If you do not have them, ask someone to add them for you. What else can I do to help prevent falls?  Wear shoes that:  Do not have high heels.  Have rubber bottoms.  Are comfortable and fit you well.  Are closed at the toe. Do not wear sandals.  If you use a stepladder:  Make sure that it is fully opened. Do not climb a closed stepladder.  Make sure that both sides of the stepladder are locked into place.  Ask someone to hold it for you, if possible.  Clearly mark and make sure that you can see:  Any grab bars or handrails.  First and last steps.  Where the edge of each step is.  Use tools that help you move around (mobility aids) if they are needed. These include:  Canes.  Walkers.  Scooters.  Crutches.  Turn on the lights when you go into a dark area. Replace any light bulbs as soon as they burn out.  Set up your furniture so you have a clear path. Avoid moving your furniture around.  If any of your floors are uneven, fix them.  If there are any pets around you, be aware of where they are.  Review your medicines with your doctor. Some medicines can make you feel dizzy. This can increase your chance of falling. Ask your doctor what other things that you can do to help prevent falls. This information is not intended to replace advice given to you by your health care provider. Make sure you discuss any  questions you have with your health care provider. Document Released: 02/05/2009 Document Revised: 09/17/2015 Document Reviewed: 05/16/2014 Elsevier Interactive Patient Education  2017 [employer].

## 2019-09-11 NOTE — Assessment & Plan Note (Signed)
Chronic Not ideally controlled Increased cymbalta has not helped - will taper off - dec to 60 mg daily then 30 mg then off Start remeron once off which will hopefully help with depression and sleep

## 2019-09-11 NOTE — Progress Notes (Signed)
Subjective:   Hannah Scott is a 69 y.o. female who presents for Medicare Annual (Subsequent) preventive examination.  Review of Systems:  No ROS. Medicare Wellness Visit. Additional risk factors are reflected in social history. Cardiac Risk Factors include: advanced age (>36mn, >>66women);dyslipidemia;family history of premature cardiovascular disease;hypertension;obesity (BMI >30kg/m2);Other (see comment)(depression)  Sleep Patterns: Issues with insomnia, sleeps between 4-5 hours nightly and gets up periodically during the night. Home Safety/Smoke Alarms: Feels safe in home; uses home alarm. Smoke alarms in place. Living environment: 1-story home; Lives alone; yes to needs for DME; good support system. Seat Belt Safety/Bike Helmet: Wears seat belt.    Objective:     Vitals: BP 122/80 (BP Location: Left Arm, Patient Position: Sitting, Cuff Size: Normal)   Pulse 95   Temp 98.2 F (36.8 C)   Resp 16   Ht 5' (1.524 m)   Wt 165 lb (74.8 kg)   SpO2 96%   BMI 32.22 kg/m   Body mass index is 32.22 kg/m.  Advanced Directives 09/11/2019 01/27/2017 01/10/2017 10/18/2016 09/08/2016 08/02/2016 07/06/2016  Does Patient Have a Medical Advance Directive? No Yes No No No No No  Would patient like information on creating a medical advance directive? No - Patient declined - Yes (ED - Information included in AVS) - No - Patient declined - -    Tobacco Social History   Tobacco Use  Smoking Status Never Smoker  Smokeless Tobacco Never Used     Counseling given: No   Clinical Intake:  Pre-visit preparation completed: Yes  Pain : 0-10 Pain Score: 6  Pain Type: Chronic pain Pain Location: Back Pain Orientation: Lower Pain Descriptors / Indicators: Constant, Discomfort, Aching Pain Onset: More than a month ago Pain Frequency: Constant Pain Relieving Factors: Pain Medication  Pain Relieving Factors: Pain Medication  BMI - recorded: 32.2 Nutritional Status: BMI > 30   Obese Nutritional Risks: None Diabetes: No  How often do you need to have someone help you when you read instructions, pamphlets, or other written materials from your doctor or pharmacy?: 1 - Never What is the last grade level you completed in school?: [employer]  Interpreter Needed?: No  Information entered by :: Izaha Shughart N. HLowell Guitar LPN  Past Medical History:  Diagnosis Date  . Anemia    hx  . Arthritis   . Asthma    PFTs, February, 2011, moderate obstructive disease with response to bronchodilators, normal lung volumes, moderate reduction in diffusing capacity  . Atrial septal aneurysm    Echo, 2008-not noted on 13 echo  . CAD (coronary artery disease)    90% distal LAD in the past  /   nuclear, 2008, no ischemia, ejection fraction 70%  . [place]   DUCTAL CIS--S/P LUMPECTOMY, RADIATION AND 6 WEEKS OF TAMOXIFEN  . D-dimer, elevated    January, 2014  . Depression   . Ejection fraction    EF 60%, echo, October, 2008  . Elevated CPK    January, 2014  . Endometriosis 1989   RIGHT TUBE  . Endometriosis 1987   LEFT TUBE/OVARY W FOCAL IN-SITU ENDOMETRIAL ADENOCARCINOMA  . GERD (gastroesophageal reflux disease)    occ  . H/O hiatal hernia    ?  .Marland KitchenHyperlipidemia   . Hypertension   . Hypothyroidism    Patient has had in the past that she does not need treatment  . Kyphoscoliosis   . [place]   . Pinched nerve  lower back  . Shortness of breath   . UTI (lower urinary tract infection)    Past Surgical History:  Procedure Laterality Date  . ANKLE SURGERY Left    ligament  . APPENDECTOMY  1987   AT [facility]  . BACK SURGERY     Fusion  . BREAST LUMPECTOMY  2003   radiation on right  . BREAST LUMPECTOMY WITH RADIOACTIVE SEED LOCALIZATION Left 02/12/2016   Procedure: LEFT BREAST LUMPECTOMY WITH RADIOACTIVE SEED LOCALIZATION;  Surgeon: Excell Seltzer, MD;  Location: [facility];  Service: General;  Laterality:  Left;  . CARDIOVASCULAR STRESS TEST  09/07/05   Nuclear, was negative  . CATARACT EXTRACTION Bilateral 01,03  . OOPHORECTOMY     LSO -RSO  . PELVIC LAPAROSCOPY  1989   RSO, LYSIS OF ADHESIONS  . TOTAL ABDOMINAL HYSTERECTOMY  1987   LSO, APPENDECTOMY  . TOTAL HIP ARTHROPLASTY Right 10/28/2013   dr Lorin Mercy  . TOTAL HIP ARTHROPLASTY Right 10/28/2013   Procedure: TOTAL HIP ARTHROPLASTY ANTERIOR APPROACH;  Surgeon: Marybelle Killings, MD;  Location: [facility];  Service: Orthopedics;  Laterality: Right;  Right Total Hip Arthroplasty, Direct Anterior Approach   Family History  Problem Relation Age of Onset  . Heart failure Mother   . Pneumonia Mother   . Hypertension Mother   . Heart disease Mother   . Stroke Maternal Grandfather   . Hypertension Maternal Grandfather   . Heart attack Father   . Heart disease Father   . Asthma Paternal Uncle        PAT UNCLES  . Heart attack Paternal Uncle    Social History   Socioeconomic History  . Marital status: Widowed    Spouse name: Not on file  . Number of children: Not on file  . Years of education: Not on file  . Highest education level: Not on file  Occupational History  . Not on file  Tobacco Use  . Smoking status: Never Smoker  . Smokeless tobacco: Never Used  Substance and Sexual Activity  . Alcohol use: No    Alcohol/week: 0.0 standard drinks  . Drug use: No  . Sexual activity: Never    Birth control/protection: Surgical  Other Topics Concern  . Not on file  Social History Narrative  . Not on file   Social Determinants of Health   Financial Resource Strain: Low Risk   . Difficulty of Paying Living Expenses: Not very hard  Food Insecurity:   . Worried About Charity fundraiser in the Last Year:   . Arboriculturist in the Last Year:   Transportation Needs:   . Film/video editor (Medical):   [name] Lack of Transportation (Non-Medical):   Physical Activity:   . Days of Exercise per Week:   . Minutes of Exercise per Session:    Stress:   . Feeling of Stress :   Social Connections:   . Frequency of Communication with Friends and Family:   . Frequency of Social Gatherings with Friends and Family:   . Attends Religious Services:   . Active Member of Clubs or Organizations:   . Attends Archivist Meetings:   [name] Marital Status:     Outpatient Encounter Medications as of 09/11/2019  Medication Sig  . albuterol (PROVENTIL HFA;VENTOLIN HFA) 108 (90 Base) MCG/ACT inhaler Inhale 2 puffs into the lungs every 4 (four) hours as needed for wheezing or shortness of breath.  [name] atorvastatin (LIPITOR) 20 MG tablet TAKE  1 TABLET BY MOUTH DAILY  . benazepril (LOTENSIN) 40 MG tablet TAKE 1 TABLET BY MOUTH DAILY  . BREO ELLIPTA 100-25 MCG/INH AEPB INHALE 1 PUFF INTO THE LUNGS DAILY  . DULoxetine (CYMBALTA) 30 MG capsule Take 1 capsule (30 mg total) by mouth daily. Take an addition to 60 mg pill for a total of 90 mg daily.  . DULoxetine (CYMBALTA) 60 MG capsule TAKE ONE CAPSULE EVERY DAY  . gabapentin (NEURONTIN) 600 MG tablet Take 1 tablet (600 mg total) by mouth at bedtime.  . hydrochlorothiazide (HYDRODIURIL) 25 MG tablet Take 1 tablet (25 mg total) by mouth daily.  [name] HYDROcodone-acetaminophen (NORCO) 10-325 MG tablet Take 1 tablet by mouth every 8 (eight) hours as needed.  [name] ipratropium-albuterol (DUONEB) 0.5-2.5 (3) MG/3ML SOLN Take 3 mLs by nebulization every 4 (four) hours as needed (wheezing or SOB). During exacerbation  . levothyroxine (SYNTHROID) 75 MCG tablet TAKE 1 TABLET BY MOUTH DAILY  . metoprolol tartrate (LOPRESSOR) 50 MG tablet Take 2 tablets (100 mg total) by mouth 2 (two) times daily. Please make overdue follow up appt for further refills. [phone] 3rd and final attempt   No facility-administered encounter medications on file as of 09/11/2019.    Activities of Daily Living In your present state of health, do you have any difficulty performing the following activities: 09/11/2019  Hearing? N  Vision?  N  Comment wears readers  Difficulty concentrating or making decisions? N  Walking or climbing stairs? Y  Dressing or bathing? N  Doing errands, shopping? N  Preparing Food and eating ? N  Using the Toilet? N  In the past six months, have you accidently leaked urine? N  Do you have problems with loss of bowel control? N  Managing your Medications? N  Managing your Finances? N  Housekeeping or managing your Housekeeping? N  Some recent data might be hidden    Patient Care Team: Binnie Rail, MD as PCP - General (Internal Medicine) Dorothy Spark, MD as PCP - Cardiology (Cardiology) Charlton Haws, [facility] as Pharmacist (Pharmacist)    Assessment:   This is a routine wellness examination for Tyrianna.  Exercise Activities and Dietary recommendations Current Exercise Habits: The patient does not participate in regular exercise at present, Exercise limited by: orthopedic condition(s);psychological condition(s);respiratory conditions(s);neurologic condition(s);cardiac condition(s)  Goals    . Asthma: reduce symptoms and costs     CARE PLAN ENTRY  Current Barriers:  . Chronic Disease Management support, education, and care coordination needs related to asthma . Current asthma regimen:   Breo Ellipta 100-25 mcg 1 puff daily  Duoneb 3 mL q4h PRN during exacerbation  Pharmacist Clinical Goal(s):  [name] Over the next 30 days, patient will work with PharmD and providers towards optimized asthma therapy  Interventions: . Comprehensive medication review performed. . Discussed cost of Breo and medication access during coverage gap ("donut hole") o May pursue patient assistance during donut hole  Patient Self Care Activities:  . Self administers medications as prescribed, Calls pharmacy for medication refills, and Calls provider office for new concerns or questions . Patient will contact pharmacist if [facility] cost increases  Initial goal documentation    . Cholesterol: goal LDL <  70     CARE PLAN ENTRY (see longitudinal plan of care for additional care plan information)  Current Barriers:  . Uncontrolled hyperlipidemia, complicated by HTN, CAD, prediabetes . Current antihyperlipidemic regimen:   Atorvastatin 20 mg daily . Previous antihyperlipidemic medications tried none . Most recent  lipid panel:     Component Value Date/Time   CHOL 124 07/10/2019 1501   TRIG 88.0 07/10/2019 1501   HDL 49.10 07/10/2019 1501   CHOLHDL 3 07/10/2019 1501   VLDL 17.6 07/10/2019 1501   LDLCALC 58 07/10/2019 1501   LDLDIRECT 154.8 02/20/2009 1116   . ASCVD risk enhancing conditions: age >64,  HTN, history of MI  Pharmacist Clinical Goal(s):  [name] Over the next 30 days, patient will work with PharmD and providers towards optimized antihyperlipidemic therapy to maintain LDL < 70  Interventions: . Comprehensive medication review performed; medication list updated in electronic medical record.  Bertram Savin care team collaboration (see longitudinal plan of care) . Discussed benefits of atorvastatin for reducing cardiovascular risk  Patient Self Care Activities:  . Patient will focus on medication adherence by pill box  Initial goal documentation     . Depression: improve symptoms     CARE PLAN ENTRY  Current Barriers:  . Chronic Disease Management support, education, and care coordination needs related to depression . Current regimen: duloxetine 60 mg daily  Pharmacist Clinical Goal(s):  [name] Over the next 30 days, patient will work with PharmD and providers towards optimized antidepressant therapy  Interventions: . Collaboration with provider re: medication management . Discussed adding bupropion vs increasing dose of duloxetine to address depressed mood o Patient would rather increase dose of current medication than start a new one right now  Patient Self Care Activities:  . Patient verbalizes understanding of plan to increase duloxetine to 90 mg daily, Self  administers medications as prescribed, Calls pharmacy for medication refills, and Calls provider office for new concerns or questions  Initial goal documentation    . Hypertension: goal BP < 130/80     CARE PLAN ENTRY (see longitudinal plan of care for additional care plan information)  Current Barriers:  . Uncontrolled hypertension, complicated by HLD, CAD, asthma . Current antihypertensive regimen:   Benazepril 40 mg daily  HCTZ 25 mg daily  Metoprolol tartrate 100 mg (50 mg x 2) BID . Previous antihypertensives tried: amlodipine . Last practice recorded BP readings:  BP Readings from Last 3 Encounters:  08/09/19 101/62  07/12/19 (!) 107/58  07/10/19 (!) 162/84   . Current home BP readings: 123/68, 111/58 . Most recent eGFR/CrCl: 68  Pharmacist Clinical Goal(s):  [name] Over the next 30 days, patient will work with PharmD and providers to optimize antihypertensive regimen  Interventions: . Inter-disciplinary care team collaboration (see longitudinal plan of care) . Comprehensive medication review performed; medication list updated in the electronic medical record.  . Patient is holding HCTZ about once a week due to low BP (<110/60) o Recommended to take 1/2 HCTZ dose (12.5 mg daily)  Patient Self Care Activities:  . Patient will continue to check BP daily , document, and provide at future appointments . Patient will focus on medication adherence . Patient will reduce hydrochlorothiazide to 12.5 mg (1/2 of 25 mg) daily  Initial goal documentation     . Osteoporosis: reduce fracture risk     CARE PLAN ENTRY  Current Barriers:  . Chronic Disease Management support, education, and care coordination needs related to osteoporosis . Current regimen: no medications  Pharmacist Clinical Goal(s):  [name] Over the next 30 days, patient will work with PharmD and providers towards optimized bone mineral density  Interventions: . Comprehensive medication review performed. . Discussed  benefits of calcium and Vitamin D supplementation for bone health  Patient Self Care Activities:  . Patient  will initiate calcium-Vitamin D supplement once daily to assess tolerability . If no side effects after 1 week, patient will increase to twice daily dosing  Initial goal documentation    . Stay as healthy and as independent as possible       Fall Risk Fall Risk  09/11/2019 09/10/2019 08/09/2019 07/12/2019 07/10/2019  Falls in the past year? 0 0 0 0 0  Number falls in past yr: 0 - - - 0  Injury with Fall? 0 - - - 0  Risk for fall due to : Impaired balance/gait;Medication side effect;Mental status change;Orthopedic patient - - - -  Follow up Falls evaluation completed;Education provided;Falls prevention discussed - - - -   Is the patient's home free of loose throw rugs in walkways, pet beds, electrical cords, etc?   yes      Grab bars in the bathroom? yes      Handrails on the stairs?   yes      Adequate lighting?   yes  Depression Screen PHQ 2/9 Scores 09/11/2019 09/11/2018 05/16/2018 01/17/2018  PHQ - 2 Score '1 4 2 2  ' PHQ- 9 Score - - - -  Exception Documentation - - - -  Not completed - - - -     Cognitive Function MMSE - Mini Mental State Exam 01/10/2017  Orientation to time 5  Orientation to Place 5  Registration 3  Attention/ Calculation 5  Recall 2  Language- name 2 objects 2  Language- repeat 1  Language- follow 3 step command 3  Language- read & follow direction 1  Write a sentence 1  Copy design 1  Total score 29     6CIT Screen 09/11/2019  What Year? 0 points  What month? 0 points  What time? 3 points  Count back from 20 0 points  Months in reverse 0 points  Repeat phrase 0 points  Total Score 3    Immunization History  Administered Date(s) Administered  . Fluad Quad(high Dose 65+) 01/04/2019  . Influenza Split 03/23/2011, 02/15/2012  . Influenza Whole 02/12/2003, 12/24/2009  . Influenza, High Dose Seasonal PF 01/06/2016, 01/10/2017, 03/16/2018  .  Influenza,inj,Quad PF,6+ Mos 01/29/2013, 01/03/2014, 02/11/2015  . Pneumococcal Conjugate-13 05/06/2015  . Pneumococcal Polysaccharide-23 07/06/2016  . Tdap 01/09/2019    Qualifies for Shingles Vaccine? declined  Screening Tests Health Maintenance  Topic Date Due  . COVID-19 Vaccine (1) Never done  . COLONOSCOPY  04/09/2014  . DEXA SCAN  12/23/2018  . MAMMOGRAM  01/04/2019  . INFLUENZA VACCINE  11/24/2019  . TETANUS/TDAP  01/08/2029  . Hepatitis C Screening  Completed  . PNA vac Low Risk Adult  Completed    Cancer Screenings: Lung: Low Dose CT Chest recommended if Age 44-80 years, 30 pack-year currently smoking OR have quit w/in 15years. Patient does not qualify. Breast:  Up to date on Mammogram? No; will schedule for 2021 Up to date of Bone Density/Dexa? Yes Colorectal: No; will like to schedule with [facility]     Plan:     Reviewed health maintenance screenings with patient today and relevant education, vaccines, and/or referrals were provided.    Continue doing brain stimulating activities (puzzles, reading, adult coloring books, staying active) to keep memory sharp.    Continue to eat heart healthy diet (full of fruits, vegetables, whole grains, lean protein, water--limit salt, fat, and sugar intake) and increase physical activity as tolerated.  I have personally reviewed and noted the following in the patient's chart:   .  Medical and social history . Use of alcohol, tobacco or illicit drugs  . Current medications and supplements . Functional ability and status . Nutritional status . Physical activity . Advanced directives . List of other physicians . Hospitalizations, surgeries, and ER visits in previous 12 months . Vitals . Screenings to include cognitive, depression, and falls . Referrals and appointments  In addition, I have reviewed and discussed with patient certain preventive protocols, quality metrics, and best practice recommendations. A  written personalized care plan for preventive services as well as general preventive health recommendations were provided to patient.     Sheral Flow, LPN  [id number]/[id number] Nurse Health Advisor

## 2019-09-11 NOTE — Patient Instructions (Addendum)
  Blood work was ordered.     Medications reviewed and updated.  Changes include :    Decrease benazepril to 20 mg daily. Go down to 60 mg of cymbalta for 5 days then 30 mg daily of the Cymbalta and then stop them when you run.   Start remeron at night 15 mg    Your prescription(s) have been submitted to your pharmacy. Please take as directed and contact our office if you believe you are having problem(s) with the medication(s).    Please followup in 3 months

## 2019-09-11 NOTE — Assessment & Plan Note (Signed)
Chronic bp too low at times Will try decreasing benazepril to 20 mg daily Continue hctz and metoprolol Monitor BP at home cmp

## 2019-09-12 ENCOUNTER — Telehealth: Payer: Self-pay | Admitting: Internal Medicine

## 2019-09-12 ENCOUNTER — Other Ambulatory Visit: Payer: Self-pay | Admitting: Internal Medicine

## 2019-09-12 ENCOUNTER — Other Ambulatory Visit: Payer: Self-pay

## 2019-09-12 DIAGNOSIS — I10 Essential (primary) hypertension: Secondary | ICD-10-CM

## 2019-09-12 NOTE — Telephone Encounter (Signed)
Pt aware of results 

## 2019-09-12 NOTE — Telephone Encounter (Signed)
Please return  call to discuss lab results 

## 2019-09-13 ENCOUNTER — Other Ambulatory Visit (INDEPENDENT_AMBULATORY_CARE_PROVIDER_SITE_OTHER): Payer: Medicare Other

## 2019-09-13 DIAGNOSIS — I10 Essential (primary) hypertension: Secondary | ICD-10-CM

## 2019-09-13 LAB — BASIC METABOLIC PANEL
BUN: 16 mg/dL (ref 6–23)
CO2: 37 mEq/L — ABNORMAL HIGH (ref 19–32)
Calcium: 9.2 mg/dL (ref 8.4–10.5)
Chloride: 97 mEq/L (ref 96–112)
Creatinine, Ser: 0.85 mg/dL (ref 0.40–1.20)
GFR: 66.1 mL/min (ref >60.00)
Glucose, Bld: 108 mg/dL — ABNORMAL HIGH (ref 70–99)
Potassium: 5 mEq/L (ref 3.5–5.1)
Sodium: 137 mEq/L (ref 135–145)

## 2019-09-15 ENCOUNTER — Encounter: Payer: Self-pay | Admitting: Internal Medicine

## 2019-09-20 ENCOUNTER — Other Ambulatory Visit: Payer: Self-pay | Admitting: Cardiology

## 2019-09-20 DIAGNOSIS — I10 Essential (primary) hypertension: Secondary | ICD-10-CM

## 2019-09-22 ENCOUNTER — Other Ambulatory Visit: Payer: Self-pay | Admitting: Cardiology

## 2019-09-24 ENCOUNTER — Other Ambulatory Visit: Payer: Self-pay

## 2019-09-24 ENCOUNTER — Ambulatory Visit: Payer: Medicare Other | Admitting: Pharmacist

## 2019-09-24 ENCOUNTER — Other Ambulatory Visit: Payer: Self-pay | Admitting: Cardiology

## 2019-09-24 DIAGNOSIS — I10 Essential (primary) hypertension: Secondary | ICD-10-CM

## 2019-09-24 DIAGNOSIS — M810 Age-related osteoporosis without current pathological fracture: Secondary | ICD-10-CM

## 2019-09-24 DIAGNOSIS — E782 Mixed hyperlipidemia: Secondary | ICD-10-CM

## 2019-09-24 DIAGNOSIS — F3289 Other specified depressive episodes: Secondary | ICD-10-CM

## 2019-09-24 DIAGNOSIS — J4530 Mild persistent asthma, uncomplicated: Secondary | ICD-10-CM

## 2019-09-24 NOTE — Patient Instructions (Addendum)
Visit Information  Goals Addressed            This Visit's Progress   . Pharmacy Care Plan       CARE PLAN ENTRY  Current Barriers:  . Chronic Disease Management support, education, and care coordination needs related to Hypertension, Hyperlipidemia, Coronary Artery Disease, Asthma, Depression, and Osteoporosis   Hypertension BP Readings from Last 3 Encounters:  09/11/19 122/80  09/11/19 122/80  09/10/19 110/73 .  Pharmacist Clinical Goal(s): o Over the next 30 days, patient will work with PharmD and providers to maintain BP goal 110/60 - 130/80 . Current regimen:  o Benazepril 20 mg daily o HCTZ 12.5 mg daily o Metoprolol tartrate 100 mg (50 mg x 2) BID . Interventions: o Discussed BP goals and importance of maintaining BP in goal range to prevent dizziness (from low BP) and heart attack/stroke (from high BP) . Patient self care activities - Over the next 30 days, patient will: o Check BP daily, document, and provide at future appointments o Ensure daily salt intake < 2300 mg/day o Continue medications as prescribed  Hyperlipidemia/Coronary artery disease Lipid Panel     Component Value Date/Time   CHOL 124 07/10/2019 1501   TRIG 88.0 07/10/2019 1501   HDL 49.10 07/10/2019 1501   LDLCALC 58 07/10/2019 1501 .  Pharmacist Clinical Goal(s): o Over the next 30 days, patient will work with PharmD and providers to maintain LDL goal < 70 . Current regimen:  o Atorvastatin 20 mg daily . Interventions: o Discussed cholesterol goal and benefits of statin for prevention of heart attack and stroke . Patient self care activities - Over the next 30 days, patient will: o Continue medication as prescribed o Continue low cholesterol diet  Asthma . Pharmacist Clinical Goal(s) o Over the next 30 days, patient will work with PharmD and providers to optimize inhaler therapy and reduce costs . Current regimen:  o Breo Ellipta 100-25 mcg 1 puff daily o Albuterol HFA prn - does not  have one currently o Duoneb 3 mL q4h PRN during exacerbation . Interventions: o Discussed cost of Breo and medication access during coverage gap ("donut hole") - May pursue patient assistance during donut hole . Patient self care activities - Over the next 30 days, patient will: o Continue medication as prescribed o Contact pharmacist if Adair Patter becomes too expensive  Depression . Pharmacist Clinical Goal(s) o Over the next 30 days, patient will work with PharmD and providers to optimize antidepressant therapy . Current regimen:  o Mirtazapine 15 mg daily at bedtime . Interventions: o Discussed counter-intuitive nature of mirtazapine in relation to sleepiness - lower doses actually cause more sleepiness than higher doses o Recommend continue mirtazapine at current dose for another week to see if body adjusts . Patient self care activities - Over the next 30 days, patient will: o Continue medication as prescribed o Contact pharmacist or provider with issues or concerns  Osteoporosis . Pharmacist Clinical Goal(s) o Over the next 30 days, patient will work with PharmD and providers to optimize therapy to reduce fracture risk . Current regimen:  o Calcium + Vitamin D . Interventions: o Discussed benefits of calcium and Vitamin D supplementation for bone health . Patient self care activities - Over the next 30 days, patient will: o Initiate calcium-Vitamin D supplement once daily to assess tolerability o If no side effects after 1 week, patient will increase to twice daily dosing  Medication management . Pharmacist Clinical Goal(s): o Over the  next 30 days, patient will work with PharmD and providers to maintain optimal medication adherence . [facility] Interventions o Comprehensive medication review performed. o Continue current medication management strategy . Patient self care activities - Over the next 30 days, patient will: o Focus on  medication adherence by pill box o Take medications as prescribed o Report any questions or concerns to PharmD and/or provider(s)  Please see past updates related to this goal by clicking on the "Past Updates" button in the selected goal       The patient verbalized understanding of instructions provided today and declined a print copy of patient instruction materials.   Telephone follow up appointment with pharmacy team member scheduled for: 1 week  Charlene Brooke, PharmD Clinical Pharmacist [facility] [place] protect organs, store calcium, anchor muscles, and support the whole body. Keeping your bones strong is important, especially as you get older. You can take actions to help keep your bones strong and healthy. Why is keeping my bones healthy important?  Keeping your bones healthy is important because your body constantly replaces bone cells. Cells get old, and new cells take their place. As we age, we lose bone cells because the body may not be able to make enough new cells to replace the old cells. The amount of bone cells and bone tissue you have is referred to as bone mass. The higher your bone mass, the stronger your bones. The aging process leads to an overall loss of bone mass in the body, which can increase the likelihood of:  Joint pain and stiffness.  Broken bones.  A condition in which the bones become weak and brittle (osteoporosis). A large decline in bone mass occurs in older adults. In women, it occurs about the time of menopause. What actions can I take to keep my bones healthy? Good health habits are important for maintaining healthy bones. This includes eating nutritious foods and exercising regularly. To have healthy bones, you need to get enough of the right minerals and vitamins. Most nutrition experts recommend getting these nutrients from the foods that you eat. In some cases, taking supplements may  also be recommended. Doing certain types of exercise is also important for bone health. What are the nutritional recommendations for healthy bones?  Eating a well-balanced diet with plenty of calcium and vitamin D will help to protect your bones. Nutritional recommendations vary from person to person. Ask your health care provider what is healthy for you. Here are some general guidelines. Get enough calcium Calcium is the most important (essential) mineral for bone health. Most people can get enough calcium from their diet, but supplements may be recommended for people who are at risk for osteoporosis. Good sources of calcium include:  Dairy products, such as low-fat or nonfat milk, cheese, and yogurt.  Dark green leafy vegetables, such as bok choy and broccoli.  Calcium-fortified foods, such as orange juice, cereal, bread, soy beverages, and tofu products.  Nuts, such as almonds. Follow these recommended amounts for daily calcium intake:  Children, age 6-3: 700 mg.  Children, age 16-8: 1,000 mg.  Children, age 26-13: 1,300 mg.  Teens, age 71-18: 1,300 mg.  Adults, age 29-50: 1,000 mg.  Adults, age 65-70: ? Men: 1,000 mg. ? Women: 1,200 mg.  Adults, age 35 or older: 1,200 mg.  Pregnant and breastfeeding females: ? Teens: 1,300 mg. ? Adults: 1,000 mg. Get enough vitamin D  Vitamin D is the most essential vitamin for bone health. It helps the body absorb calcium. Sunlight stimulates the skin to make vitamin D, so be sure to get enough sunlight. If you live in a cold climate or you do not get outside often, your health care provider may recommend that you take vitamin D supplements. Good sources of vitamin D in your diet include:  Egg yolks.  Saltwater fish.  Milk and cereal fortified with vitamin D. Follow these recommended amounts for daily vitamin D intake:  Children and teens, age 59-18: 600 international units.  Adults, age 70 or younger: 400-800 international  units.  Adults, age 37 or older: 800-1,000 international units. Get other important nutrients Other nutrients that are important for bone health include:  Phosphorus. This mineral is found in meat, poultry, dairy foods, nuts, and legumes. The recommended daily intake for adult men and adult women is 700 mg.  Magnesium. This mineral is found in seeds, nuts, dark green vegetables, and legumes. The recommended daily intake for adult men is 400-420 mg. For adult women, it is 310-320 mg.  Vitamin K. This vitamin is found in green leafy vegetables. The recommended daily intake is 120 mg for adult men and 90 mg for adult women. What type of physical activity is best for building and maintaining healthy bones? Weight-bearing and strength-building activities are important for building and maintaining healthy bones. Weight-bearing activities cause muscles and bones to work against gravity. Strength-building activities increase the strength of the muscles that support bones. Weight-bearing and muscle-building activities include:  Walking and hiking.  Jogging and running.  Dancing.  Gym exercises.  Lifting weights.  Tennis and racquetball.  Climbing stairs.  Aerobics. Adults should get at least 30 minutes of moderate physical activity on most days. Children should get at least 60 minutes of moderate physical activity on most days. Ask your health care provider what type of exercise is best for you. How can I find out if my bone mass is low? Bone mass can be measured with an X-ray test called a bone mineral density (BMD) test. This test is recommended for all women who are age 599 or older. It may also be recommended for:  Men who are age 64 or older.  People who are at risk for osteoporosis because of: ? Having bones that break easily. ? Having a long-term disease that weakens bones, such as kidney disease or rheumatoid arthritis. ? Having menopause earlier than normal. ? Taking medicine  that weakens bones, such as steroids, thyroid hormones, or hormone treatment for breast cancer or prostate cancer. ? Smoking. ? Drinking three or more alcoholic drinks a day. If you find that you have a low bone mass, you may be able to prevent osteoporosis or further bone loss by changing your diet and lifestyle. Where can I find more information? For more information, check out the following websites:  [place]: AviationTales.fr  [employer] of Health: www.bones.SouthExposed.es  International Osteoporosis Foundation: Administrator.iofbonehealth.org Summary  The aging process leads to an overall loss of bone mass in the body, which can increase the likelihood of broken bones and osteoporosis.  Eating a well-balanced diet with plenty of calcium and vitamin D will help to protect your bones.  Weight-bearing and strength-building activities are also important for building and maintaining strong bones.  Bone mass can be measured with an X-ray test called a bone mineral density (BMD) test. This information is not intended to replace advice given to you by your  health care provider. Make sure you discuss any questions you have with your health care provider. Document Revised: 05/08/2017 Document Reviewed: 05/08/2017 Elsevier Patient Education  2020 [employer].

## 2019-09-24 NOTE — Chronic Care Management (AMB) (Signed)
Chronic Care Management Pharmacy  Name: Hannah Scott  MRN: [id number] DOB: 12-Jun-1950  Chief Complaint/ HPI  Hannah Scott,  69 y.o. , female presents for their Follow-Up CCM visit with the clinical pharmacist via telephone due to COVID-19 Pandemic.  PCP : Binnie Rail, MD  Their chronic conditions include: HTN, CAD (MI x 2 1999), HLD, asthma, migraine, depression, hypothyroidism, osteoporosis, osteoarthritis, Hx breast cancer, prediabetes  Lived in same place since 07/22/1971. Husband passed away 6 years ago. Two sons, one lives 1/2 mile away, other lives in [place]. New granddaughter Ava Shirlee [place].  Office Visits: 09/11/19 Dr Quay Burow OV: increased duloxetine did not help; taper off and start mirtazapine for depression and insomnia. BP low, decrease benazepril to 20 mg.  07/10/19 Dr Quay Burow OV: started HCTZ  3 weeks prior. BP better controlled, pt held BP med one day due to low BP. BMP WNL.  06/17/19 Dr Quay Burow OV: BP 194/100, started HCTZ. Return 3 wks for BMP.  Consult Visit: 08/09/19 NP Marcello Moores (phys med): s/p lumbar fusion, refilled hydrocodone. Has office visit every month for opioid monitoring.  02/18/18 Dr Meda Coffee (cardiology): worsening sx of DOE, ordered coronary CTA. Increased metoprolol to 75 mg BID  Medications: Outpatient Encounter Medications as of 09/24/2019  Medication Sig  . albuterol (PROVENTIL HFA;VENTOLIN HFA) 108 (90 Base) MCG/ACT inhaler Inhale 2 puffs into the lungs every 4 (four) hours as needed for wheezing or shortness of breath.  [name] atorvastatin (LIPITOR) 20 MG tablet Take 1 tablet (20 mg total) by mouth daily. Please make overdue follow up appt for further refills [phone]  . benazepril (LOTENSIN) 40 MG tablet Take 0.5 tablets (20 mg total) by mouth daily.  [name] BREO ELLIPTA 100-25 MCG/INH AEPB INHALE 1 PUFF INTO THE LUNGS DAILY  . gabapentin (NEURONTIN) 600 MG tablet Take 1 tablet (600 mg total) by mouth at bedtime.  . hydrochlorothiazide (HYDRODIURIL) 25 MG tablet Take 1  tablet (25 mg total) by mouth daily. (Patient taking differently: Take 12.5 mg by mouth daily. )  . HYDROcodone-acetaminophen (NORCO) 10-325 MG tablet Take 1 tablet by mouth every 8 (eight) hours as needed.  [name] ipratropium-albuterol (DUONEB) 0.5-2.5 (3) MG/3ML SOLN Take 3 mLs by nebulization every 4 (four) hours as needed (wheezing or SOB). During exacerbation  . levothyroxine (SYNTHROID) 75 MCG tablet TAKE 1 TABLET BY MOUTH DAILY  . metoprolol tartrate (LOPRESSOR) 50 MG tablet TAKE 2 TABLETS BY MOUTH TWICE DAILY  . mirtazapine (REMERON) 15 MG tablet Take 1 tablet (15 mg total) by mouth at bedtime.  . [DISCONTINUED] atorvastatin (LIPITOR) 20 MG tablet TAKE 1 TABLET BY MOUTH DAILY   No facility-administered encounter medications on file as of 09/24/2019.     Current Diagnosis/Assessment:  SDOH Interventions     Most Recent Value  SDOH Interventions  Transportation Interventions  Intervention Not Indicated      Goals Addressed            This Visit's Progress   . Pharmacy Care Plan       CARE PLAN ENTRY  Current Barriers:  . Chronic Disease Management support, education, and care coordination needs related to Hypertension, Hyperlipidemia, Coronary Artery Disease, Asthma, Depression, and Osteoporosis   Hypertension BP Readings from Last 3 Encounters:  09/11/19 122/80  09/11/19 122/80  09/10/19 110/73 .  Pharmacist Clinical Goal(s): o Over the next 30 days, patient will work with PharmD and providers to maintain BP goal 110/60 - 130/80 . Current regimen:  o Benazepril 20 mg daily  o HCTZ 12.5 mg daily o Metoprolol tartrate 100 mg (50 mg x 2) BID . Interventions: o Discussed BP goals and importance of maintaining BP in goal range to prevent dizziness (from low BP) and heart attack/stroke (from high BP) . Patient self care activities - Over the next 30 days, patient will: o Check BP daily, document, and provide at future appointments o Ensure daily salt intake < 2300  mg/day o Continue medications as prescribed  Hyperlipidemia/Coronary artery disease Lipid Panel     Component Value Date/Time   CHOL 124 07/10/2019 1501   TRIG 88.0 07/10/2019 1501   HDL 49.10 07/10/2019 1501   LDLCALC 58 07/10/2019 1501 .  Pharmacist Clinical Goal(s): o Over the next 30 days, patient will work with PharmD and providers to maintain LDL goal < 70 . Current regimen:  o Atorvastatin 20 mg daily . Interventions: o Discussed cholesterol goal and benefits of statin for prevention of heart attack and stroke . Patient self care activities - Over the next 30 days, patient will: o Continue medication as prescribed o Continue low cholesterol diet  Asthma . Pharmacist Clinical Goal(s) o Over the next 30 days, patient will work with PharmD and providers to optimize inhaler therapy and reduce costs . Current regimen:  o Breo Ellipta 100-25 mcg 1 puff daily o Albuterol HFA prn - does not have one currently o Duoneb 3 mL q4h PRN during exacerbation . Interventions: o Discussed cost of Breo and medication access during coverage gap ("donut hole") - May pursue patient assistance during donut hole . Patient self care activities - Over the next 30 days, patient will: o Continue medication as prescribed o Contact pharmacist if Adair Patter becomes too expensive  Depression . Pharmacist Clinical Goal(s) o Over the next 30 days, patient will work with PharmD and providers to optimize antidepressant therapy . Current regimen:  o Mirtazapine 15 mg daily at bedtime . Interventions: o Discussed counter-intuitive nature of mirtazapine in relation to sleepiness - lower doses actually cause more sleepiness than higher doses o Recommend continue mirtazapine at current dose for another week to see if body adjusts . Patient self care activities - Over the next 30 days, patient will: o Continue medication as prescribed o Contact pharmacist or provider with issues or  concerns  Osteoporosis . Pharmacist Clinical Goal(s) o Over the next 30 days, patient will work with PharmD and providers to optimize therapy to reduce fracture risk . Current regimen:  o Calcium + Vitamin D . Interventions: o Discussed benefits of calcium and Vitamin D supplementation for bone health . Patient self care activities - Over the next 30 days, patient will: o Initiate calcium-Vitamin D supplement once daily to assess tolerability o If no side effects after 1 week, patient will increase to twice daily dosing  Medication management . Pharmacist Clinical Goal(s): o Over the next 30 days, patient will work with PharmD and providers to maintain optimal medication adherence . [facility] Interventions o Comprehensive medication review performed. o Continue current medication management strategy . Patient self care activities - Over the next 30 days, patient will: o Focus on medication adherence by pill box o Take medications as prescribed o Report any questions or concerns to PharmD and/or provider(s)  Please see past updates related to this goal by clicking on the "Past Updates" button in the selected goal        Hypertension   BP today is:  <130/80  Office blood pressures are  BP Readings from Last 3 Encounters:  09/11/19 122/80  09/11/19 122/80  09/10/19 110/73   Kidney Function Lab Results  Component Value Date/Time   CREATININE 0.85 09/13/2019 10:45 AM   CREATININE 0.84 09/11/2019 02:57 PM   GFR 66.10 09/13/2019 10:45 AM   GFRNONAA 93 04/13/2018 02:28 PM   GFRAA 108 04/13/2018 02:28 PM   Patient has failed these meds in the past: amlodipine (edema) Patient is currently controlled on the following medications:   Benazepril 20 mg daily  HCTZ 12.5 mg daily  Metoprolol tartrate 100 mg (50 mg x 2) BID  Patient checks BP at home daily  Patient home BP readings are ranging: 115-117/70s  We discussed: last visit  decreased HCTZ to 12.5 mg to occassional low BP and PCP reduced benazepril to 20 mg in interim; today pt reports BP is much better controlled, no longer having low BP or associated dizziness.  Plan  Continue current medications   Hyperlipidemia/CAD   Lipid Panel     Component Value Date/Time   CHOL 124 07/10/2019 1501   TRIG 88.0 07/10/2019 1501   HDL 49.10 07/10/2019 1501   CHOLHDL 3 07/10/2019 1501   VLDL 17.6 07/10/2019 1501   LDLCALC 58 07/10/2019 1501    CAD, Hx of MI x 2, last in 1999, no stents.  Patient has failed these meds in past: n/a Patient is currently controlled on the following medications:   Atorvastatin 20 mg daily  We discussed:  diet and exercise extensively  Plan  Continue current medications and control with diet and exercise   Asthma / Tobacco   Last spirometry score: n/a  Eosinophil count:   Lab Results  Component Value Date/Time   EOSPCT 11.2 (H) 01/04/2019 10:46 AM   EOSPCT 8.8 (H) 03/24/2009 09:41 AM  %                               Eos (Absolute):  Lab Results  Component Value Date/Time   EOSABS 0.6 01/04/2019 10:46 AM   EOSABS 0.5 03/24/2009 09:41 AM   Tobacco Status:  Social History   Tobacco Use  Smoking Status Never Smoker  Smokeless Tobacco Never Used   Patient has failed these meds in past: Qvar, Dulera Patient is currently controlled on the following medications:   Breo Ellipta 100-25 mcg 1 puff daily  Albuterol HFA prn - does not have one currently  Duoneb 3 mL q4h PRN during exacerbation  Using maintenance inhaler regularly? 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 Cost of [employer] is reasonable once deductible is met.   Plan  Continue current medications   Hypothyroidism   Lab Results  Component Value Date/Time   TSH 1.37 09/11/2019 02:57 PM   TSH 3.09 07/10/2019 03:01 PM   TSH 1.45  01/04/2019 10:46 AM   Patient has failed these meds in past: n/a Patient is currently controlled on the following medications:  . Levothyroxine 75 mcg daily  We discussed:  Signs/symptoms of hypo- and hyperthyroidism; pt denies issues.  Plan  Continue current medications    Depression   Depression screen [facility] 2/9 09/11/2019 09/11/2018 05/16/2018  Decreased Interest 0 2 1  Down, Depressed, Hopeless '1 2 1  ' PHQ - 2 Score '1 4 2  ' Altered sleeping - - -  Tired, decreased energy - - -  Change in appetite - - -  Feeling bad or failure about yourself  - - -  Trouble concentrating - - -  Moving slowly or fidgety/restless - - -  Suicidal thoughts - - -  PHQ-9 Score - - -  Difficult doing work/chores - - -  Some recent data might be hidden   Patient has failed these meds in past: citalopram, sertraline, duloxetine Patient is currently uncontrolled on the following medications:   Mirtazapine 15 mg daily HS  We discussed:  Last visit pt reports she was still depressed, discussed augmentation with bupropion vs increasing duloxetine, pt wanted to increase duloxetine. Due to no benefit, PCP tapered duloxetine and started mirtazapine in the interim;   Today patient reports she just started mirtazapine 3-4 days ago and has noticed an improvement in sleep and mood already. She is having excess sedation in the morning; discussed paradoxical lessening in sleep side effect with higher doses; encouraged patient to continue current dose for another week to allow her body to adjust. If still having excess morning sedation at follow up, may increase mirtazapine to 30 mg (due to less sedation at higher doses)  Plan  Continue current medication F/U in 1 week to assess morning sedation - consider increasing mirtazapine if still an issue  Osteoarthritis/Pain   Patient has failed these meds in past: duloxetine Patient is currently controlled on the following medications:   Hydrocodone-APAP 10-325 mg -1  tablet q8h prn  Gabapentin 600 mg HS  We discussed:  Pt reports pain is controlled "enough" with current regimen. Follows with pain management.  Plan  Continue current medications   Osteoporosis   Last DEXA Scan: 12/22/2016   T-Score femoral neck: -2.2  T-Score total hip: -2.8  10-year probability of major osteoporotic fracture: 12%  10-year probability of hip fracture: 2.6%  VitD level: 21.44 (07/12/2017:)  Patient is a candidate for pharmacologic treatment due to T-Score < -2.5 in total hip   Patient has failed these meds in past: Vitamin D 50,000 weekly, did not tolerate daily OTC vitamin D Patient is currently uncontrolled on the following medications:   Calcium-Vitamin D supplement  We discussed:  Recommend [phone] units of vitamin D daily. Recommend 1200 mg of calcium daily from dietary and supplemental sources. Patient is willing to try Vitamin D again since she hasn't tried in several years - she remembers she had GI upset with vitamin D previously, discussed that this is atypical side effect.  Plan  Continue current medications   Medication Management   Pt uses [facility] and [facility] for all medications Uses pill box? Yes Pt endorses 100% compliance  We discussed: Pt uses 2 different pharmacies because some medications are cheaper with independent despite non-preferred status per patient.   Plan  Continue current medication management strategy    Follow up: 1 week phone visit  Charlene Brooke, PharmD Clinical Pharmacist [facility] [phone]

## 2019-09-25 ENCOUNTER — Other Ambulatory Visit: Payer: Self-pay | Admitting: Cardiology

## 2019-09-25 DIAGNOSIS — I10 Essential (primary) hypertension: Secondary | ICD-10-CM

## 2019-09-30 ENCOUNTER — Other Ambulatory Visit: Payer: Self-pay | Admitting: Cardiology

## 2019-09-30 DIAGNOSIS — I10 Essential (primary) hypertension: Secondary | ICD-10-CM

## 2019-10-01 ENCOUNTER — Ambulatory Visit: Payer: Medicare Other | Admitting: Pharmacist

## 2019-10-01 ENCOUNTER — Other Ambulatory Visit: Payer: Self-pay

## 2019-10-01 DIAGNOSIS — F3289 Other specified depressive episodes: Secondary | ICD-10-CM

## 2019-10-01 DIAGNOSIS — G4709 Other insomnia: Secondary | ICD-10-CM

## 2019-10-01 NOTE — Patient Instructions (Signed)
Visit Information  Phone number for Pharmacist: [phone]  Goals Addressed            This Visit's Progress   . Pharmacy Care Plan       CARE PLAN ENTRY  Current Barriers:  . Chronic Disease Management support, education, and care coordination needs related to Hypertension, Hyperlipidemia, Coronary Artery Disease, Asthma, Depression, and Osteoporosis   Hypertension BP Readings from Last 3 Encounters:  09/11/19 122/80  09/11/19 122/80  09/10/19 110/73 .  Pharmacist Clinical Goal(s): o Over the next 30 days, patient will work with PharmD and providers to maintain BP goal 110/60 - 130/80 . Current regimen:  o Benazepril 20 mg daily o HCTZ 12.5 mg daily o Metoprolol tartrate 100 mg (50 mg x 2) BID . Interventions: o Discussed BP goals and importance of maintaining BP in goal range to prevent dizziness (from low BP) and heart attack/stroke (from high BP) . Patient self care activities - Over the next 30 days, patient will: o Check BP daily, document, and provide at future appointments o Ensure daily salt intake < 2300 mg/day o Continue medications as prescribed  Hyperlipidemia/Coronary artery disease Lipid Panel     Component Value Date/Time   CHOL 124 07/10/2019 1501   TRIG 88.0 07/10/2019 1501   HDL 49.10 07/10/2019 1501   LDLCALC 58 07/10/2019 1501 .  Pharmacist Clinical Goal(s): o Over the next 30 days, patient will work with PharmD and providers to maintain LDL goal < 70 . Current regimen:  o Atorvastatin 20 mg daily . Interventions: o Discussed cholesterol goal and benefits of statin for prevention of heart attack and stroke . Patient self care activities - Over the next 30 days, patient will: o Continue medication as prescribed o Continue low cholesterol diet  Asthma . Pharmacist Clinical Goal(s) o Over the next 30 days, patient will work with PharmD and providers to optimize inhaler therapy and reduce costs . Current regimen:  o Breo Ellipta 100-25 mcg 1  puff daily o Albuterol HFA prn - does not have one currently o Duoneb 3 mL q4h PRN during exacerbation . Interventions: o Discussed cost of Breo and medication access during coverage gap ("donut hole") - May pursue patient assistance during donut hole . Patient self care activities - Over the next 30 days, patient will: o Continue medication as prescribed o Contact pharmacist if Adair Patter becomes too expensive  Depression . Pharmacist Clinical Goal(s) o Over the next 30 days, patient will work with PharmD and providers to optimize antidepressant therapy . Current regimen:  o Mirtazapine 15 mg daily at bedtime . Interventions: o Discussed counter-intuitive nature of mirtazapine in relation to sleepiness - lower doses actually cause more sleepiness than higher doses o Recommend to increase mirtazapine to 2 tablets at bedtime and reduce gabapentin to 1/2 tablet at bedtime (300 mg) . Patient self care activities - Over the next 30 days, patient will: o Trial medication as above o Contact pharmacist or provider with issues or concerns  Osteoporosis . Pharmacist Clinical Goal(s) o Over the next 30 days, patient will work with PharmD and providers to optimize therapy to reduce fracture risk . Current regimen:  o Calcium + Vitamin D . Interventions: o Discussed benefits of calcium and Vitamin D supplementation for bone health . Patient self care activities - Over the next 30 days, patient will: o Initiate calcium-Vitamin D supplement once daily to assess tolerability o If no side effects after 1 week, patient will increase to twice daily  dosing  Medication management . Pharmacist Clinical Goal(s): o Over the next 30 days, patient will work with PharmD and providers to maintain optimal medication adherence . [facility] Interventions o Comprehensive medication review performed. o Continue current medication management strategy . Patient self care  activities - Over the next 30 days, patient will: o Focus on medication adherence by pill box o Take medications as prescribed o Report any questions or concerns to PharmD and/or provider(s)  Please see past updates related to this goal by clicking on the "Past Updates" button in the selected goal       The patient verbalized understanding of instructions provided today and declined a print copy of patient instruction materials.   Telephone follow up appointment with pharmacy team member scheduled for: 1 week   Charlene Brooke, PharmD Clinical Pharmacist [facility] at [facility] [phone]

## 2019-10-01 NOTE — Addendum Note (Signed)
Addended by: Carter Kitten D on: 10/01/2019 04:36 PM   Modules accepted: Orders

## 2019-10-01 NOTE — Chronic Care Management (AMB) (Signed)
Chronic Care Management Pharmacy  Name: VISTA SAWATZKY  MRN: [id number] DOB: 1951-04-02  Chief Complaint/ HPI  Guy Sandifer Corsello,  69 y.o. , female presents for their Follow-Up CCM visit with the clinical pharmacist via telephone due to COVID-19 Pandemic.  PCP : Binnie Rail, MD  Their chronic conditions include: HTN, CAD (MI x 2 1999), HLD, asthma, migraine, depression, hypothyroidism, osteoporosis, osteoarthritis, Hx breast cancer, prediabetes  Lived in same place since 08/09/1971. Husband passed away 6 years ago. Two sons, one lives 1/2 mile away, other lives in [place]. New granddaughter Ava Shirlee [place].  Office Visits: 09/11/19 Dr Quay Burow OV: increased duloxetine did not help; taper off and start mirtazapine for depression and insomnia. BP low, decrease benazepril to 20 mg.  07/10/19 Dr Quay Burow OV: started HCTZ  3 weeks prior. BP better controlled, pt held BP med one day due to low BP. BMP WNL.  06/17/19 Dr Quay Burow OV: BP 194/100, started HCTZ. Return 3 wks for BMP.  Consult Visit: 08/09/19 NP Marcello Moores (phys med): s/p lumbar fusion, refilled hydrocodone. Has office visit every month for opioid monitoring.  02/18/18 Dr Meda Coffee (cardiology): worsening sx of DOE, ordered coronary CTA. Increased metoprolol to 75 mg BID  Medications: Outpatient Encounter Medications as of 10/01/2019  Medication Sig  . albuterol (PROVENTIL HFA;VENTOLIN HFA) 108 (90 Base) MCG/ACT inhaler Inhale 2 puffs into the lungs every 4 (four) hours as needed for wheezing or shortness of breath.  [name] atorvastatin (LIPITOR) 20 MG tablet Take 1 tablet (20 mg total) by mouth daily. Please make overdue follow up appt for further refills [phone]  . benazepril (LOTENSIN) 40 MG tablet Take 0.5 tablets (20 mg total) by mouth daily.  [name] BREO ELLIPTA 100-25 MCG/INH AEPB INHALE 1 PUFF INTO THE LUNGS DAILY  . gabapentin (NEURONTIN) 600 MG tablet Take 1 tablet (600 mg total) by mouth at bedtime.  . hydrochlorothiazide (HYDRODIURIL) 25 MG tablet Take 1  tablet (25 mg total) by mouth daily. (Patient taking differently: Take 12.5 mg by mouth daily. )  . HYDROcodone-acetaminophen (NORCO) 10-325 MG tablet Take 1 tablet by mouth every 8 (eight) hours as needed.  [name] ipratropium-albuterol (DUONEB) 0.5-2.5 (3) MG/3ML SOLN Take 3 mLs by nebulization every 4 (four) hours as needed (wheezing or SOB). During exacerbation  . levothyroxine (SYNTHROID) 75 MCG tablet TAKE 1 TABLET BY MOUTH DAILY  . metoprolol tartrate (LOPRESSOR) 50 MG tablet Take 2 tablets (100 mg total) by mouth 2 (two) times daily. Please schedule an appt for further refills 1st attempt  . mirtazapine (REMERON) 15 MG tablet Take 1 tablet (15 mg total) by mouth at bedtime.   No facility-administered encounter medications on file as of 10/01/2019.     Current Diagnosis/Assessment:    Goals Addressed            This Visit's Progress   . Pharmacy Care Plan       CARE PLAN ENTRY  Current Barriers:  . Chronic Disease Management support, education, and care coordination needs related to Hypertension, Hyperlipidemia, Coronary Artery Disease, Asthma, Depression, and Osteoporosis   Hypertension BP Readings from Last 3 Encounters:  09/11/19 122/80  09/11/19 122/80  09/10/19 110/73 .  Pharmacist Clinical Goal(s): o Over the next 30 days, patient will work with PharmD and providers to maintain BP goal 110/60 - 130/80 . Current regimen:  o Benazepril 20 mg daily o HCTZ 12.5 mg daily o Metoprolol tartrate 100 mg (50 mg x 2) BID . Interventions: o Discussed BP goals and  importance of maintaining BP in goal range to prevent dizziness (from low BP) and heart attack/stroke (from high BP) . Patient self care activities - Over the next 30 days, patient will: o Check BP daily, document, and provide at future appointments o Ensure daily salt intake < 2300 mg/day o Continue medications as prescribed  Hyperlipidemia/Coronary artery disease Lipid Panel     Component Value Date/Time   CHOL  124 07/10/2019 1501   TRIG 88.0 07/10/2019 1501   HDL 49.10 07/10/2019 1501   LDLCALC 58 07/10/2019 1501 .  Pharmacist Clinical Goal(s): o Over the next 30 days, patient will work with PharmD and providers to maintain LDL goal < 70 . Current regimen:  o Atorvastatin 20 mg daily . Interventions: o Discussed cholesterol goal and benefits of statin for prevention of heart attack and stroke . Patient self care activities - Over the next 30 days, patient will: o Continue medication as prescribed o Continue low cholesterol diet  Asthma . Pharmacist Clinical Goal(s) o Over the next 30 days, patient will work with PharmD and providers to optimize inhaler therapy and reduce costs . Current regimen:  o Breo Ellipta 100-25 mcg 1 puff daily o Albuterol HFA prn - does not have one currently o Duoneb 3 mL q4h PRN during exacerbation . Interventions: o Discussed cost of Breo and medication access during coverage gap ("donut hole") - May pursue patient assistance during donut hole . Patient self care activities - Over the next 30 days, patient will: o Continue medication as prescribed o Contact pharmacist if Adair Patter becomes too expensive  Depression . Pharmacist Clinical Goal(s) o Over the next 30 days, patient will work with PharmD and providers to optimize antidepressant therapy . Current regimen:  o Mirtazapine 15 mg daily at bedtime . Interventions: o Discussed counter-intuitive nature of mirtazapine in relation to sleepiness - lower doses actually cause more sleepiness than higher doses o Recommend to increase mirtazapine to 2 tablets at bedtime and reduce gabapentin to 1/2 tablet at bedtime (300 mg) . Patient self care activities - Over the next 30 days, patient will: o Trial medication as above o Contact pharmacist or provider with issues or concerns  Osteoporosis . Pharmacist Clinical Goal(s) o Over the next 30 days, patient will work with PharmD and providers to optimize  therapy to reduce fracture risk . Current regimen:  o Calcium + Vitamin D . Interventions: o Discussed benefits of calcium and Vitamin D supplementation for bone health . Patient self care activities - Over the next 30 days, patient will: o Initiate calcium-Vitamin D supplement once daily to assess tolerability o If no side effects after 1 week, patient will increase to twice daily dosing  Medication management . Pharmacist Clinical Goal(s): o Over the next 30 days, patient will work with PharmD and providers to maintain optimal medication adherence . [facility] pharmacy[facility] Interventions o Comprehensive medication review performed. o Continue current medication management strategy . Patient self care activities - Over the next 30 days, patient will: o Focus on medication adherence by pill box o Take medications as prescribed o Report any questions or concerns to PharmD and/or provider(s)  Please see past updates related to this goal by clicking on the "Past Updates" button in the selected goal         Depression   Depression screen [facility] 2/9 09/11/2019 09/11/2018 05/16/2018  Decreased Interest 0 2 1  Down, Depressed, Hopeless 1 2 1   PHQ - 2 Score 1  4 2  Altered sleeping - - -  Tired, decreased energy - - -  Change in appetite - - -  Feeling bad or failure about yourself  - - -  Trouble concentrating - - -  Moving slowly or fidgety/restless - - -  Suicidal thoughts - - -  PHQ-9 Score - - -  Difficult doing work/chores - - -  Some recent data might be hidden   Patient has failed these meds in past: citalopram, sertraline, duloxetine Patient is currently uncontrolled on the following medications:   Mirtazapine 15 mg daily HS  We discussed:  Pt recently switched from duloxetine to mirtazapine due to inadequate benefit with duloxetine.  Pt reports symptoms were well controlled on mirtazapine, she reported fewer crying spells and better sleep, however  she was more sedated in the morning to the point of slurring words so she stopped mirtazapine and restarted duloxetine. Given paradoxical reduction in sedation with higher doses of mirtazapine, pt may benefit from higher dose of mirtazapine.  Plan  Stop duloxetine Recommend to increase mirtazapine to 2 tablets (30 mg) at bedtime F/u 1 week to assess effect on morning sedation  Osteoarthritis/Pain   Patient has failed these meds in past: duloxetine Patient is currently controlled on the following medications:   Hydrocodone-APAP 10-325 mg -1 tablet q8h prn  Gabapentin 600 mg HS  We discussed:  Pt reports pain is controlled "enough" with current regimen. Follows with pain management. She states gabapentin was started to help with restless leg and does help with this. With recent oversedation, she is open to trying a lower dose of gabapentin.  Plan  Recommend reducing gabapentin to 1/2 tablet (300 mg) HS    Follow up: 1 week phone visit  Charlene Brooke, PharmD Clinical Pharmacist [facility] at [facility] [phone]

## 2019-10-07 ENCOUNTER — Other Ambulatory Visit: Payer: Self-pay

## 2019-10-07 ENCOUNTER — Other Ambulatory Visit: Payer: Self-pay | Admitting: Internal Medicine

## 2019-10-07 ENCOUNTER — Ambulatory Visit: Payer: Medicare Other | Admitting: Pharmacist

## 2019-10-07 DIAGNOSIS — F3289 Other specified depressive episodes: Secondary | ICD-10-CM

## 2019-10-07 DIAGNOSIS — G4709 Other insomnia: Secondary | ICD-10-CM

## 2019-10-07 NOTE — Chronic Care Management (AMB) (Signed)
Chronic Care Management Pharmacy  Name: Hannah Scott  MRN: [id number] DOB: 11-16-1950  Chief Complaint/ HPI  Hannah Scott,  69 y.o. , female presents for their Follow-Up CCM visit with the clinical pharmacist via telephone due to COVID-19 Pandemic  PCP : Binnie Rail, MD  Their chronic conditions include: HTN, CAD (MI x 2 1999), HLD, asthma, migraine, depression, hypothyroidism, osteoporosis, osteoarthritis, Hx breast cancer, prediabetes  Lived in same place since Jul 19, 1971. Husband passed away 6 years ago. Two sons, one lives 1/2 mile away, other lives in [place]. New granddaughter Hannah Scott.  Office Visits: 09/11/19 Dr Quay Burow OV: increased duloxetine did not help; taper off and start mirtazapine for depression and insomnia. BP low, decrease benazepril to 20 mg.  07/10/19 Dr Quay Burow OV: started HCTZ  3 weeks prior. BP better controlled, pt held BP med one day due to low BP. BMP WNL.  06/17/19 Dr Quay Burow OV: BP 194/100, started HCTZ. Return 3 wks for BMP.  Consult Visit: 08/09/19 NP Marcello Moores (phys med): s/p lumbar fusion, refilled hydrocodone. Has office visit every month for opioid monitoring.  02/18/18 Dr Meda Coffee (cardiology): worsening sx of DOE, ordered coronary CTA. Increased metoprolol to 75 mg BID  Medications: Outpatient Encounter Medications as of 10/07/2019  Medication Sig Note  . albuterol (PROVENTIL HFA;VENTOLIN HFA) 108 (90 Base) MCG/ACT inhaler Inhale 2 puffs into the lungs every 4 (four) hours as needed for wheezing or shortness of breath.   [name] atorvastatin (LIPITOR) 20 MG tablet Take 1 tablet (20 mg total) by mouth daily. Please make overdue follow up appt for further refills [phone]   . benazepril (LOTENSIN) 40 MG tablet Take 0.5 tablets (20 mg total) by mouth daily.   [name] BREO ELLIPTA 100-25 MCG/INH AEPB INHALE 1 PUFF INTO THE LUNGS DAILY   . DULoxetine (CYMBALTA) 60 MG capsule Take 1 capsule (60 mg total) by mouth daily. Do NOT chew, crush, or open capsule.... Annual appt due  in Sept must see provider for future refills   . gabapentin (NEURONTIN) 600 MG tablet Take 1 tablet (600 mg total) by mouth at bedtime.   . hydrochlorothiazide (HYDRODIURIL) 25 MG tablet Take 1 tablet (25 mg total) by mouth daily. (Patient taking differently: Take 12.5 mg by mouth daily. )   . HYDROcodone-acetaminophen (NORCO) 10-325 MG tablet Take 1 tablet by mouth every 8 (eight) hours as needed.   [name] ipratropium-albuterol (DUONEB) 0.5-2.5 (3) MG/3ML SOLN Take 3 mLs by nebulization every 4 (four) hours as needed (wheezing or SOB). During exacerbation   . levothyroxine (SYNTHROID) 75 MCG tablet TAKE 1 TABLET BY MOUTH DAILY   . metoprolol tartrate (LOPRESSOR) 50 MG tablet Take 2 tablets (100 mg total) by mouth 2 (two) times daily. Please schedule an appt for further refills 1st attempt   . mirtazapine (REMERON) 15 MG tablet Take 1 tablet (15 mg total) by mouth at bedtime. (Patient not taking: Reported on 10/07/2019) 10/07/2019: oversedation   No facility-administered encounter medications on file as of 10/07/2019.     Current Diagnosis/Assessment:    Goals Addressed            This Visit's Progress   . Pharmacy Care Plan       CARE PLAN ENTRY  Current Barriers:  . Chronic Disease Management support, education, and care coordination needs related to Hypertension, Hyperlipidemia, Coronary Artery Disease, Asthma, Depression, and Osteoporosis   Hypertension BP Readings from Last 3 Encounters:  09/11/19 122/80  09/11/19 122/80  09/10/19 110/73 .  Pharmacist Clinical Goal(s): o Over the next 120 days, patient will work with PharmD and providers to maintain BP goal 110/60 - 130/80 . Current regimen:  o Benazepril 20 mg daily o HCTZ 12.5 mg daily o Metoprolol tartrate 100 mg (50 mg x 2) BID . Interventions: o Discussed BP goals and importance of maintaining BP in goal range to prevent dizziness (from low BP) and heart attack/stroke (from high BP) . Patient self care activities - Over  the next 120 days, patient will: o Check BP daily, document, and provide at future appointments o Ensure daily salt intake < 2300 mg/day o Continue medications as prescribed  Hyperlipidemia/Coronary artery disease Lab Results  Component Value Date/Time   LDLCALC 58 07/10/2019 03:01 PM   LDLDIRECT 154.8 02/20/2009 11:16 AM .  Pharmacist Clinical Goal(s): o Over the next 120 days, patient will work with PharmD and providers to maintain LDL goal < 70 . Current regimen:  o Atorvastatin 20 mg daily . Interventions: o Discussed cholesterol goal and benefits of statin for prevention of heart attack and stroke . Patient self care activities - Over the next 120 days, patient will: o Continue medication as prescribed o Continue low cholesterol diet  Asthma . Pharmacist Clinical Goal(s) o Over the next 120 days, patient will work with PharmD and providers to optimize inhaler therapy and reduce costs . Current regimen:  o Breo Ellipta 100-25 mcg 1 puff daily o Albuterol HFA prn - does not have one currently o Duoneb 3 mL q4h PRN during exacerbation . Interventions: o Discussed cost of Breo and medication access during coverage gap ("donut hole") - May pursue patient assistance during donut hole . Patient self care activities - Over the next 120 days, patient will: o Continue medication as prescribed o Contact pharmacist if Adair Patter becomes too expensive  Depression . Pharmacist Clinical Goal(s) o Over the next 120 days, patient will work with PharmD and providers to optimize antidepressant therapy . Current regimen:  o Mirtazapine 30 mg at bedtime . Interventions: o Discussed side effects of mirtazapine are not tolerable; may return to previous dose of duloxetine . Patient self care activities - Over the next 120 days, patient will: o Stop Mirtazapine and restart duloxetine 60 mg daily o Contact pharmacist or provider with issues or concerns  Osteoporosis . Pharmacist Clinical  Goal(s) o Over the next 120 days, patient will work with PharmD and providers to optimize therapy to reduce fracture risk . Current regimen:  o Calcium + Vitamin D . Interventions: o Discussed benefits of calcium and Vitamin D supplementation for bone health . Patient self care activities - Over the next 120 days, patient will: o Initiate calcium-Vitamin D supplement once daily to assess tolerability o If no side effects after 1 week, patient will increase to twice daily dosing  Medication management . Pharmacist Clinical Goal(s): o Over the next 120 days, patient will work with PharmD and providers to maintain optimal medication adherence . [facility] Interventions o Comprehensive medication review performed. o Continue current medication management strategy . Patient self care activities - Over the next 120 days, patient will: o Focus on medication adherence by pill box o Take medications as prescribed o Report any questions or concerns to PharmD and/or provider(s)  Please see past updates related to this goal by clicking on the "Past Updates" button in the selected goal         Depression   Depression screen [facility] 2/9 09/11/2019 09/11/2018  05/16/2018  Decreased Interest 0 2 1  Down, Depressed, Hopeless 1 2 1   PHQ - 2 Score 1 4 2   Altered sleeping - - -  Tired, decreased energy - - -  Change in appetite - - -  Feeling bad or failure about yourself  - - -  Trouble concentrating - - -  Moving slowly or fidgety/restless - - -  Suicidal thoughts - - -  PHQ-9 Score - - -  Difficult doing work/chores - - -  Some recent data might be hidden   Patient has failed these meds in past: citalopram, sertraline, duloxetine Patient is currently uncontrolled on the following medications:   Duloxetine 60 mg daily  We discussed:  Pt recently switched from duloxetine to mirtazapine due to inadequate benefit with duloxetine, however she experienced  oversedation and "fogginess" in the AM even after trial of higher 30 mg dose. Pt stopped mirtazapine and restarted previously-tolerated dose of duloxetine over the weekend.  Plan  Continue current medications  Osteoarthritis/Pain   Patient has failed these meds in past: duloxetine Patient is currently controlled on the following medications:   Hydrocodone-APAP 10-325 mg -1 tablet q8h prn  Gabapentin 600 mg HS  We discussed:  Pt had reduced gabapentin to 1/2 tablet due to sedation issues with mirtazapine, however now that she has returned to duloxetine she is taking full dose of gabapentin again. Denies issues.   Plan  Continue current medications    Follow up: 4 month phone visit  Charlene Brooke, PharmD Clinical Pharmacist [facility] at [facility] [phone]

## 2019-10-07 NOTE — Patient Instructions (Signed)
Visit Information  Phone number for Pharmacist: [phone]  Goals Addressed            This Visit's Progress   . Pharmacy Care Plan       CARE PLAN ENTRY  Current Barriers:  . Chronic Disease Management support, education, and care coordination needs related to Hypertension, Hyperlipidemia, Coronary Artery Disease, Asthma, Depression, and Osteoporosis   Hypertension BP Readings from Last 3 Encounters:  09/11/19 122/80  09/11/19 122/80  09/10/19 110/73 .  Pharmacist Clinical Goal(s): o Over the next 120 days, patient will work with PharmD and providers to maintain BP goal 110/60 - 130/80 . Current regimen:  o Benazepril 20 mg daily o HCTZ 12.5 mg daily o Metoprolol tartrate 100 mg (50 mg x 2) BID . Interventions: o Discussed BP goals and importance of maintaining BP in goal range to prevent dizziness (from low BP) and heart attack/stroke (from high BP) . Patient self care activities - Over the next 120 days, patient will: o Check BP daily, document, and provide at future appointments o Ensure daily salt intake < 2300 mg/day o Continue medications as prescribed  Hyperlipidemia/Coronary artery disease Lab Results  Component Value Date/Time   LDLCALC 58 07/10/2019 03:01 PM   LDLDIRECT 154.8 02/20/2009 11:16 AM .  Pharmacist Clinical Goal(s): o Over the next 120 days, patient will work with PharmD and providers to maintain LDL goal < 70 . Current regimen:  o Atorvastatin 20 mg daily . Interventions: o Discussed cholesterol goal and benefits of statin for prevention of heart attack and stroke . Patient self care activities - Over the next 120 days, patient will: o Continue medication as prescribed o Continue low cholesterol diet  Asthma . Pharmacist Clinical Goal(s) o Over the next 120 days, patient will work with PharmD and providers to optimize inhaler therapy and reduce costs . Current regimen:  o Breo Ellipta 100-25 mcg 1 puff daily o Albuterol HFA prn - does not  have one currently o Duoneb 3 mL q4h PRN during exacerbation . Interventions: o Discussed cost of Breo and medication access during coverage gap ("donut hole") - May pursue patient assistance during donut hole . Patient self care activities - Over the next 120 days, patient will: o Continue medication as prescribed o Contact pharmacist if Adair Patter becomes too expensive  Depression . Pharmacist Clinical Goal(s) o Over the next 120 days, patient will work with PharmD and providers to optimize antidepressant therapy . Current regimen:  o Mirtazapine 30 mg at bedtime . Interventions: o Discussed side effects of mirtazapine are not tolerable; may return to previous dose of duloxetine . Patient self care activities - Over the next 120 days, patient will: o Stop Mirtazapine and restart duloxetine 60 mg daily o Contact pharmacist or provider with issues or concerns  Osteoporosis . Pharmacist Clinical Goal(s) o Over the next 120 days, patient will work with PharmD and providers to optimize therapy to reduce fracture risk . Current regimen:  o Calcium + Vitamin D . Interventions: o Discussed benefits of calcium and Vitamin D supplementation for bone health . Patient self care activities - Over the next 120 days, patient will: o Initiate calcium-Vitamin D supplement once daily to assess tolerability o If no side effects after 1 week, patient will increase to twice daily dosing  Medication management . Pharmacist Clinical Goal(s): o Over the next 120 days, patient will work with PharmD and providers to maintain optimal medication adherence . Current pharmacy: [facility] Interventions  o Comprehensive medication review performed. o Continue current medication management strategy . Patient self care activities - Over the next 120 days, patient will: o Focus on medication adherence by pill box o Take medications as prescribed o Report any questions or concerns to  PharmD and/or provider(s)  Please see past updates related to this goal by clicking on the "Past Updates" button in the selected goal       The patient verbalized understanding of instructions provided today and declined a print copy of patient instruction materials.   Telephone follow up appointment with pharmacy team member scheduled for: 4 months  Charlene Brooke, PharmD Clinical Pharmacist [facility] at [facility] [phone]

## 2019-10-08 ENCOUNTER — Other Ambulatory Visit: Payer: Self-pay | Admitting: Internal Medicine

## 2019-10-11 ENCOUNTER — Encounter: Payer: Medicare Other | Attending: Physical Medicine & Rehabilitation | Admitting: Registered Nurse

## 2019-10-11 ENCOUNTER — Telehealth: Payer: Self-pay | Admitting: Cardiology

## 2019-10-11 ENCOUNTER — Other Ambulatory Visit: Payer: Self-pay

## 2019-10-11 ENCOUNTER — Encounter: Payer: Self-pay | Admitting: Registered Nurse

## 2019-10-11 ENCOUNTER — Ambulatory Visit: Payer: Medicare Other | Admitting: Registered Nurse

## 2019-10-11 VITALS — BP 124/78 | HR 71 | Temp 97.5°F | Ht 60.0 in | Wt 168.4 lb

## 2019-10-11 DIAGNOSIS — M4126 Other idiopathic scoliosis, lumbar region: Secondary | ICD-10-CM | POA: Diagnosis not present

## 2019-10-11 DIAGNOSIS — M48061 Spinal stenosis, lumbar region without neurogenic claudication: Secondary | ICD-10-CM | POA: Diagnosis not present

## 2019-10-11 DIAGNOSIS — M961 Postlaminectomy syndrome, not elsewhere classified: Secondary | ICD-10-CM | POA: Insufficient documentation

## 2019-10-11 DIAGNOSIS — M24551 Contracture, right hip: Secondary | ICD-10-CM

## 2019-10-11 DIAGNOSIS — M1611 Unilateral primary osteoarthritis, right hip: Secondary | ICD-10-CM | POA: Insufficient documentation

## 2019-10-11 DIAGNOSIS — G894 Chronic pain syndrome: Secondary | ICD-10-CM | POA: Insufficient documentation

## 2019-10-11 DIAGNOSIS — M48062 Spinal stenosis, lumbar region with neurogenic claudication: Secondary | ICD-10-CM | POA: Diagnosis not present

## 2019-10-11 DIAGNOSIS — Z5181 Encounter for therapeutic drug level monitoring: Secondary | ICD-10-CM | POA: Diagnosis not present

## 2019-10-11 DIAGNOSIS — Z79891 Long term (current) use of opiate analgesic: Secondary | ICD-10-CM | POA: Diagnosis not present

## 2019-10-11 DIAGNOSIS — I10 Essential (primary) hypertension: Secondary | ICD-10-CM

## 2019-10-11 MED ORDER — HYDROCODONE-ACETAMINOPHEN 10-325 MG PO TABS: 1.0000 | ORAL_TABLET | Freq: Three times a day (TID) | ORAL | 0 refills | Status: DC | PRN

## 2019-10-11 MED ORDER — GABAPENTIN 600 MG PO TABS: 600.0000 mg | ORAL_TABLET | Freq: Every day | ORAL | 5 refills | Status: DC

## 2019-10-11 MED ORDER — METOPROLOL TARTRATE 50 MG PO TABS: 100.0000 mg | ORAL_TABLET | Freq: Two times a day (BID) | ORAL | 0 refills | Status: DC

## 2019-10-11 NOTE — Progress Notes (Signed)
Subjective:    Patient ID: Hannah Scott, female    DOB: January 17, 1951, 69 y.o.   MRN: [id number]  HPI: Hannah Scott is a 69 y.o. female who returns for follow up appointment for chronic pain and medication refill. She states her pain is located in her lower back. She rates her pain 6. Her  current exercise regime is walking and performing house work.  Ms. Mcgaugh Morphine equivalent is 33.33 MME.  Oral Swab Performed Today.    Pain Inventory Average Pain 6 Pain Right Now 6 My pain is constant, burning, tingling and aching  In the last 24 hours, has pain interfered with the following? General activity 10 Relation with others 10 Enjoyment of life 10 What TIME of day is your pain at its worst? all the time. Sleep (in general) Poor  Pain is worse with: walking and standing Pain improves with: rest and medication Relief from Meds: 5  Mobility walk with assistance use a cane use a walker how many minutes can you walk? Unknown ability to climb steps?  yes do you drive?  yes  Function disabled: date disabled 2008 I need assistance with the following:  household duties Do you have any goals in this area?  no  Neuro/Psych tingling depression  Prior Studies Any changes since last visit?  no  Physicians involved in your care Any changes since last visit?  no   Family History  Problem Relation Age of Onset  . Heart failure Mother   . Pneumonia Mother   . Hypertension Mother   . Heart disease Mother   . Stroke Maternal Grandfather   . Hypertension Maternal Grandfather   . Heart attack Father   . Heart disease Father   . Asthma Paternal Uncle        PAT UNCLES  . Heart attack Paternal Uncle    Social History   Socioeconomic History  . Marital status: Widowed    Spouse name: Not on file  . Number of children: Not on file  . Years of education: Not on file  . Highest education level: Not on file  Occupational History  . Not on file  Tobacco Use  . Smoking status:  Never Smoker  . Smokeless tobacco: Never Used  Substance and Sexual Activity  . Alcohol use: No    Alcohol/week: 0.0 standard drinks  . Drug use: No  . Sexual activity: Never    Birth control/protection: Surgical  Other Topics Concern  . Not on file  Social History Narrative  . Not on file   Social Determinants of Health   Financial Resource Strain: Low Risk   . Difficulty of Paying Living Expenses: Not very hard  Food Insecurity:   . Worried About Charity fundraiser in the Last Year:   . Arboriculturist in the Last Year:   Transportation Needs: No Transportation Needs  . Lack of Transportation (Medical): No  . Lack of Transportation (Non-Medical): No  Physical Activity:   . Days of Exercise per Week:   . Minutes of Exercise per Session:   Stress:   . Feeling of Stress :   Social Connections:   . Frequency of Communication with Friends and Family:   . Frequency of Social Gatherings with Friends and Family:   . Attends Religious Services:   . Active Member of Clubs or Organizations:   . Attends Archivist Meetings:   [name] Marital Status:    Past Surgical History:  Procedure Laterality Date  . ANKLE SURGERY Left    ligament  . APPENDECTOMY  1987   AT [facility]  . BACK SURGERY     Fusion  . BREAST LUMPECTOMY  2003   radiation on right  . BREAST LUMPECTOMY WITH RADIOACTIVE SEED LOCALIZATION Left 02/12/2016   Procedure: LEFT BREAST LUMPECTOMY WITH RADIOACTIVE SEED LOCALIZATION;  Surgeon: Excell Seltzer, MD;  Location: [place];  Service: General;  Laterality: Left;  . CARDIOVASCULAR STRESS TEST  09/07/05   Nuclear, was negative  . CATARACT EXTRACTION Bilateral 01,03  . OOPHORECTOMY     LSO -RSO  . PELVIC LAPAROSCOPY  1989   RSO, LYSIS OF ADHESIONS  . TOTAL ABDOMINAL HYSTERECTOMY  1987   LSO, APPENDECTOMY  . TOTAL HIP ARTHROPLASTY Right 10/28/2013   dr Lorin Mercy  . TOTAL HIP ARTHROPLASTY Right 10/28/2013   Procedure: TOTAL HIP ARTHROPLASTY ANTERIOR  APPROACH;  Surgeon: Marybelle Killings, MD;  Location: [place];  Service: Orthopedics;  Laterality: Right;  Right Total Hip Arthroplasty, Direct Anterior Approach   Past Medical History:  Diagnosis Date  . Anemia    hx  . Arthritis   . Asthma    PFTs, February, 2011, moderate obstructive disease with response to bronchodilators, normal lung volumes, moderate reduction in diffusing capacity  . Atrial septal aneurysm    Echo, 2008-not noted on 13 echo  . CAD (coronary artery disease)    90% distal LAD in the past  /   nuclear, 2008, no ischemia, ejection fraction 70%  . Cancer ([place]) 2002   DUCTAL CIS--S/P LUMPECTOMY, RADIATION AND 6 WEEKS OF TAMOXIFEN  . D-dimer, elevated    January, 2014  . Depression   . Ejection fraction    EF 60%, echo, October, 2008  . Elevated CPK    January, 2014  . Endometriosis 1989   RIGHT TUBE  . Endometriosis 1987   LEFT TUBE/OVARY W FOCAL IN-SITU ENDOMETRIAL ADENOCARCINOMA  . GERD (gastroesophageal reflux disease)    occ  . H/O hiatal hernia    ?  [name] Hyperlipidemia   . Hypertension   . Hypothyroidism    Patient has had in the past that she does not need treatment  . Kyphoscoliosis   . Obstructive airway disease ([place])   . Pinched nerve    lower back  . Shortness of breath   . UTI (lower urinary tract infection)    BP 124/78   Pulse 71   Temp (!) 97.5 F (36.4 C)   Ht 5' (1.524 m)   Wt 168 lb 6.4 oz (76.4 kg)   BMI 32.89 kg/m   Opioid Risk Score:   Fall Risk Score:  `1  Depression screen PHQ 2/9  Depression screen [facility] 2/9 09/11/2019 09/11/2018 05/16/2018 01/17/2018 06/02/2017 05/03/2017 01/27/2017  Decreased Interest 0 2 1 1 1 1 1   Down, Depressed, Hopeless 1 2 1 1 1 1 1   PHQ - 2 Score 1 4 2 2 2 2 2   Altered sleeping - - - - - - -  Tired, decreased energy - - - - - - -  Change in appetite - - - - - - -  Feeling bad or failure about yourself  - - - - - - -  Trouble concentrating - - - - - - -  Moving slowly or fidgety/restless - - - - - - -    Suicidal thoughts - - - - - - -  PHQ-9 Score - - - - - - -  Difficult doing work/chores - - - - - - -  Some recent data might be hidden   Review of Systems  Constitutional: Negative.   HENT: Negative.   Eyes: Negative.   Respiratory: Positive for shortness of breath.   Cardiovascular:       2 heart attacks in the past  Gastrointestinal: Negative.   Endocrine: Negative.   Genitourinary: Negative.   Musculoskeletal: Positive for back pain, gait problem, joint swelling and neck stiffness.  Skin: Negative.   Allergic/Immunologic: Negative.   Neurological:       Tingling in hands & legs  Hematological: Negative.   Psychiatric/Behavioral:       Depression       Objective:   Physical Exam Vitals and nursing note reviewed.  Constitutional:      Appearance: Normal appearance.  Cardiovascular:     Rate and Rhythm: Normal rate and regular rhythm.     Pulses: Normal pulses.     Heart sounds: Normal heart sounds.  Musculoskeletal:     Cervical back: Normal range of motion and neck supple.     Comments: Normal Muscle Bulk and Muscle Testing Reveals:  Upper Extremities: Full ROM and Muscle Strength 5/5  Lumbar Paraspinal Tenderness: L-3-L-5 Lower Extremities: Full ROM and Muscle Strength 5/5 Arises from Table Slowly using walker for support Antalgic Gait   Skin:    General: Skin is warm and dry.  Neurological:     Mental Status: She is alert and oriented to person, place, and time.  Psychiatric:        Mood and Affect: Mood normal.        Behavior: Behavior normal.           Assessment & Plan:  1.Lumbar post laminectomy syndromewith severe kyphoscoliosis thoracolumbar spine, s/p lumbar fusion/ 10/11/2019 Continue current medication regimen.Refilled: Hydrocodone 10/325mg  one tablet every8hours may take an extra tablet when pain is severe#100. We will continue the opioid monitoring program, this consists of regular clinic visits, examinations, urine drug screen,  pill counts as well as use of [place] Controlled Substance Reporting system. 2. Right Hip OA: S/P Right Hip Replacement 10/28/2013.10/11/2019 3.Depression: Continue Cymbalta. Has Family Support and Friends. Counseling with Doristine Bosworth.10/11/2019. 4.RightGreater Trochanteric Tenderness:No complaints today.Continue to Monitor. Continue with Ice and Heat Therapy.10/11/2019  F/U in 1 month

## 2019-10-11 NOTE — Telephone Encounter (Signed)
Pt's medication was sent to pt's pharmacy as requested. Confirmation received.  °

## 2019-10-11 NOTE — Telephone Encounter (Signed)
*  STAT* If patient is at the pharmacy, call can be transferred to refill team.   1. Which medications need to be refilled? (please list name of each medication and dose if known)  metoprolol tartrate (LOPRESSOR) 50 MG tablet  2. Which pharmacy/location (including street and city if local pharmacy) is medication to be sent to? [place] [address]  3. Do they need a 30 day or 90 day supply? 90 day    Patient is completely out of medication.

## 2019-10-12 ENCOUNTER — Other Ambulatory Visit: Payer: Self-pay | Admitting: Cardiology

## 2019-10-14 ENCOUNTER — Other Ambulatory Visit: Payer: Self-pay | Admitting: Cardiology

## 2019-10-17 LAB — DRUG TOX MONITOR 1 W/CONF, ORAL FLD
Amphetamines: NEGATIVE ng/mL (ref <10)
Barbiturates: NEGATIVE ng/mL (ref <10)
Benzodiazepines: NEGATIVE ng/mL (ref <0.50)
Buprenorphine: NEGATIVE ng/mL (ref <0.10)
Cocaine: NEGATIVE ng/mL (ref <5.0)
Codeine: NEGATIVE ng/mL (ref <2.5)
Dihydrocodeine: 28.5 ng/mL — ABNORMAL HIGH (ref <2.5)
Fentanyl: NEGATIVE ng/mL (ref <0.10)
Heroin Metabolite: NEGATIVE ng/mL (ref <1.0)
Hydrocodone: 153.6 ng/mL — ABNORMAL HIGH (ref <2.5)
Hydromorphone: NEGATIVE ng/mL (ref <2.5)
MARIJUANA: NEGATIVE ng/mL (ref <2.5)
MDMA: NEGATIVE ng/mL (ref <10)
Meprobamate: NEGATIVE ng/mL (ref <2.5)
Methadone: NEGATIVE ng/mL (ref <5.0)
Morphine: NEGATIVE ng/mL (ref <2.5)
Nicotine Metabolite: NEGATIVE ng/mL (ref <5.0)
Norhydrocodone: 5.4 ng/mL — ABNORMAL HIGH (ref <2.5)
Noroxycodone: NEGATIVE ng/mL (ref <2.5)
Opiates: POSITIVE ng/mL — AB (ref <2.5)
Oxycodone: NEGATIVE ng/mL (ref <2.5)
Oxymorphone: NEGATIVE ng/mL (ref <2.5)
Phencyclidine: NEGATIVE ng/mL (ref <10)
Tapentadol: NEGATIVE ng/mL (ref <5.0)
Tramadol: NEGATIVE ng/mL (ref <5.0)
Zolpidem: NEGATIVE ng/mL (ref <5.0)

## 2019-10-17 LAB — DRUG TOX ALC METAB W/CON, ORAL FLD: Alcohol Metabolite: NEGATIVE ng/mL (ref <25)

## 2019-10-21 ENCOUNTER — Telehealth: Payer: Self-pay | Admitting: *Deleted

## 2019-10-21 NOTE — Telephone Encounter (Signed)
Oral swab drug screen was consistent for prescribed medications.  ?

## 2019-10-28 ENCOUNTER — Other Ambulatory Visit: Payer: Self-pay | Admitting: Internal Medicine

## 2019-11-08 ENCOUNTER — Other Ambulatory Visit: Payer: Self-pay | Admitting: Internal Medicine

## 2019-11-08 ENCOUNTER — Encounter: Payer: Medicare Other | Attending: Physical Medicine & Rehabilitation | Admitting: Registered Nurse

## 2019-11-08 ENCOUNTER — Encounter: Payer: Self-pay | Admitting: Registered Nurse

## 2019-11-08 ENCOUNTER — Other Ambulatory Visit: Payer: Self-pay

## 2019-11-08 VITALS — BP 111/78 | HR 66 | Temp 98.5°F | Wt 165.4 lb

## 2019-11-08 DIAGNOSIS — G894 Chronic pain syndrome: Secondary | ICD-10-CM | POA: Insufficient documentation

## 2019-11-08 DIAGNOSIS — M24551 Contracture, right hip: Secondary | ICD-10-CM | POA: Diagnosis not present

## 2019-11-08 DIAGNOSIS — M4126 Other idiopathic scoliosis, lumbar region: Secondary | ICD-10-CM | POA: Diagnosis not present

## 2019-11-08 DIAGNOSIS — M48061 Spinal stenosis, lumbar region without neurogenic claudication: Secondary | ICD-10-CM | POA: Diagnosis not present

## 2019-11-08 DIAGNOSIS — M1611 Unilateral primary osteoarthritis, right hip: Secondary | ICD-10-CM | POA: Insufficient documentation

## 2019-11-08 DIAGNOSIS — M961 Postlaminectomy syndrome, not elsewhere classified: Secondary | ICD-10-CM | POA: Insufficient documentation

## 2019-11-08 DIAGNOSIS — M48062 Spinal stenosis, lumbar region with neurogenic claudication: Secondary | ICD-10-CM | POA: Diagnosis not present

## 2019-11-08 DIAGNOSIS — Z79891 Long term (current) use of opiate analgesic: Secondary | ICD-10-CM | POA: Insufficient documentation

## 2019-11-08 DIAGNOSIS — Z5181 Encounter for therapeutic drug level monitoring: Secondary | ICD-10-CM | POA: Insufficient documentation

## 2019-11-08 MED ORDER — HYDROCODONE-ACETAMINOPHEN 10-325 MG PO TABS: 1.0000 | ORAL_TABLET | Freq: Three times a day (TID) | ORAL | 0 refills | Status: DC | PRN

## 2019-11-08 NOTE — Progress Notes (Signed)
Subjective:     Patient ID: Hannah Scott, female   DOB: [birth year], 69 y.o.   MRN: [id number]  HPI: Hannah Scott is a 69 y.o. female who returns for follow up appointment for chronic pain and medication refill. She states her pain is located in her lower back. She rates her pain 6. Her current exercise regime is walking with her walker.  Hannah Scott Morphine equivalent is 33.33 MME.  Last Oral Swab was Performed on 10/11/2019, it was consistent.   Pain Inventory Average Pain 7 Pain Right Now 6 My pain is burning, tingling and aching  In the last 24 hours, has pain interfered with the following? General activity 10 Relation with others 10 Enjoyment of life 10 What TIME of day is your pain at its worst? Morning, Daytime, Evening & Night Sleep (in general) Poor  Pain is worse with: walking and standing Pain improves with: rest and medication Relief from Meds: 5  Mobility use a cane use a walker how many minutes can you walk? 10 mins ability to climb steps?  yes do you drive?  yes  Function disabled: date disabled Maybe 2008 retired I need assistance with the following:  household duties and shopping Do you have any goals in this area?  no  Neuro/Psych weakness tingling trouble walking depression  Prior Studies Any changes since last visit?  no  Physicians involved in your care Any changes since last visit?  no   Family History  Problem Relation Age of Onset  . Heart failure Mother   . Pneumonia Mother   . Hypertension Mother   . Heart disease Mother   . Stroke Maternal Grandfather   . Hypertension Maternal Grandfather   . Heart attack Father   . Heart disease Father   . Asthma Paternal Uncle        PAT UNCLES  . Heart attack Paternal Uncle    Social History   Socioeconomic History  . Marital status: Widowed    Spouse name: Not on file  . Number of children: Not on file  . Years of education: Not on file  . Highest education level: Not on file   Occupational History  . Not on file  Tobacco Use  . Smoking status: Never Smoker  . Smokeless tobacco: Never Used  Substance and Sexual Activity  . Alcohol use: No    Alcohol/week: 0.0 standard drinks  . Drug use: No  . Sexual activity: Never    Birth control/protection: Surgical  Other Topics Concern  . Not on file  Social History Narrative  . Not on file   Social Determinants of Health   Financial Resource Strain: Low Risk   . Difficulty of Paying Living Expenses: Not very hard  Food Insecurity:   . Worried About Charity fundraiser in the Last Year:   . Arboriculturist in the Last Year:   Transportation Needs: No Transportation Needs  . Lack of Transportation (Medical): No  . Lack of Transportation (Non-Medical): No  Physical Activity:   . Days of Exercise per Week:   . Minutes of Exercise per Session:   Stress:   . Feeling of Stress :   Social Connections:   . Frequency of Communication with Friends and Family:   . Frequency of Social Gatherings with Friends and Family:   . Attends Religious Services:   . Active Member of Clubs or Organizations:   . Attends Archivist Meetings:   [name] Marital  Status:    Past Surgical History:  Procedure Laterality Date  . ANKLE SURGERY Left    ligament  . APPENDECTOMY  1987   AT [facility]  . BACK SURGERY     Fusion  . BREAST LUMPECTOMY  2003   radiation on right  . BREAST LUMPECTOMY WITH RADIOACTIVE SEED LOCALIZATION Left 02/12/2016   Procedure: LEFT BREAST LUMPECTOMY WITH RADIOACTIVE SEED LOCALIZATION;  Surgeon: Excell Seltzer, MD;  Location: [place];  Service: General;  Laterality: Left;  . CARDIOVASCULAR STRESS TEST  09/07/05   Nuclear, was negative  . CATARACT EXTRACTION Bilateral 01,03  . OOPHORECTOMY     LSO -RSO  . PELVIC LAPAROSCOPY  1989   RSO, LYSIS OF ADHESIONS  . TOTAL ABDOMINAL HYSTERECTOMY  1987   LSO, APPENDECTOMY  . TOTAL HIP ARTHROPLASTY Right 10/28/2013   dr Lorin Mercy  . TOTAL HIP  ARTHROPLASTY Right 10/28/2013   Procedure: TOTAL HIP ARTHROPLASTY ANTERIOR APPROACH;  Surgeon: Marybelle Killings, MD;  Location: [place];  Service: Orthopedics;  Laterality: Right;  Right Total Hip Arthroplasty, Direct Anterior Approach   Past Medical History:  Diagnosis Date  . Anemia    hx  . Arthritis   . Asthma    PFTs, February, 2011, moderate obstructive disease with response to bronchodilators, normal lung volumes, moderate reduction in diffusing capacity  . Atrial septal aneurysm    Echo, 2008-not noted on 13 echo  . CAD (coronary artery disease)    90% distal LAD in the past  /   nuclear, 2008, no ischemia, ejection fraction 70%  . Cancer ([place]) 2002   DUCTAL CIS--S/P LUMPECTOMY, RADIATION AND 6 WEEKS OF TAMOXIFEN  . D-dimer, elevated    January, 2014  . Depression   . Ejection fraction    EF 60%, echo, October, 2008  . Elevated CPK    January, 2014  . Endometriosis 1989   RIGHT TUBE  . Endometriosis 1987   LEFT TUBE/OVARY W FOCAL IN-SITU ENDOMETRIAL ADENOCARCINOMA  . GERD (gastroesophageal reflux disease)    occ  . H/O hiatal hernia    ?  [name] Hyperlipidemia   . Hypertension   . Hypothyroidism    Patient has had in the past that she does not need treatment  . Kyphoscoliosis   . Obstructive airway disease ([place])   . Pinched nerve    lower back  . Shortness of breath   . UTI (lower urinary tract infection)    BP 111/78   Pulse 66   Temp 98.5 F (36.9 C)   Wt 165 lb 6.4 oz (75 kg)   SpO2 95%   BMI 32.30 kg/m   Opioid Risk Score:   Fall Risk Score:  `1  Depression screen PHQ 2/9  Depression screen [facility] 2/9 10/11/2019 09/11/2019 09/11/2018 05/16/2018 01/17/2018 06/02/2017 05/03/2017  Decreased Interest 3 0 2 1 1 1 1   Down, Depressed, Hopeless 3 1 2 1 1 1 1   PHQ - 2 Score 6 1 4 2 2 2 2   Altered sleeping 3 - - - - - -  Tired, decreased energy 3 - - - - - -  Change in appetite 3 - - - - - -  Feeling bad or failure about yourself  0 - - - - - -  Trouble concentrating 2 - - -  - - -  Moving slowly or fidgety/restless 0 - - - - - -  Suicidal thoughts 0 - - - - - -  PHQ-9 Score 17 - - - - - -  Difficult doing work/chores - - - - - - -  Some recent data might be hidden   Review of Systems  Constitutional: Negative.   HENT: Negative.   Eyes: Negative.   Respiratory: Positive for shortness of breath and wheezing.   Cardiovascular: Negative.   Gastrointestinal: Negative.   Endocrine: Negative.   Genitourinary: Positive for urgency.  Musculoskeletal: Positive for back pain and gait problem.       Spasms  Skin: Negative.   Allergic/Immunologic: Negative.   Neurological: Positive for weakness and numbness.  Hematological: Negative.   Psychiatric/Behavioral:       Depression       Objective:   Physical Exam Vitals and nursing note reviewed.  Constitutional:      Appearance: Normal appearance.  Cardiovascular:     Rate and Rhythm: Normal rate and regular rhythm.     Pulses: Normal pulses.     Heart sounds: Normal heart sounds.  Pulmonary:     Effort: Pulmonary effort is normal.     Breath sounds: Normal breath sounds. No stridor.  Musculoskeletal:     Cervical back: Normal range of motion and neck supple.     Comments: Normal Muscle Bulk and Muscle Testing Reveals:  Upper Extremities: Full ROM and Muscle Strength 5/5  Lumbar Paraspinal Tenderness: L-3-L-5 Lower Extremities: Full ROM and Muscle Strength 5/5 Arises from Table slowly using walker for support Antalgic Gait   Skin:    General: Skin is warm and dry.  Neurological:     Mental Status: She is alert and oriented to person, place, and time.  Psychiatric:        Mood and Affect: Mood normal.        Behavior: Behavior normal.        Assessment:Plan  1.Lumbar post laminectomy syndromewith severe kyphoscoliosis thoracolumbar spine, s/p lumbar fusion/ 11/08/2019 Continue current medication regimen.Refilled: Hydrocodone 10/325mg  one tablet every8hours may take an extra tablet when pain  is severe#100. We will continue the opioid monitoring program, this consists of regular clinic visits, examinations, urine drug screen, pill counts as well as use of [place] Controlled Substance Reporting system. 2. Right Hip OA: S/P Right Hip Replacement 10/28/2013.11/08/2019 3.Depression: Continue Cymbalta. Has Family Support and Friends. Counseling with Doristine Bosworth.11/08/2019. 4.RightGreater Trochanteric Tenderness:No complaints today.Continue to Monitor. Continue with Ice and Heat Therapy.11/08/2019  F/U in 1 month  20  minutes of face to face patient care time was spent during this visit. All questions were encouraged and answered.

## 2019-11-21 ENCOUNTER — Telehealth: Payer: Medicare Other

## 2019-12-05 ENCOUNTER — Other Ambulatory Visit: Payer: Self-pay | Admitting: Registered Nurse

## 2019-12-06 ENCOUNTER — Encounter: Payer: Self-pay | Admitting: Registered Nurse

## 2019-12-06 ENCOUNTER — Other Ambulatory Visit: Payer: Self-pay

## 2019-12-06 ENCOUNTER — Encounter: Payer: Medicare Other | Attending: Physical Medicine & Rehabilitation | Admitting: Registered Nurse

## 2019-12-06 VITALS — BP 117/78 | HR 67 | Temp 98.2°F | Ht 60.0 in | Wt 165.2 lb

## 2019-12-06 DIAGNOSIS — M961 Postlaminectomy syndrome, not elsewhere classified: Secondary | ICD-10-CM | POA: Diagnosis not present

## 2019-12-06 DIAGNOSIS — M48062 Spinal stenosis, lumbar region with neurogenic claudication: Secondary | ICD-10-CM

## 2019-12-06 DIAGNOSIS — M48061 Spinal stenosis, lumbar region without neurogenic claudication: Secondary | ICD-10-CM | POA: Diagnosis not present

## 2019-12-06 DIAGNOSIS — G894 Chronic pain syndrome: Secondary | ICD-10-CM

## 2019-12-06 DIAGNOSIS — M1611 Unilateral primary osteoarthritis, right hip: Secondary | ICD-10-CM | POA: Diagnosis not present

## 2019-12-06 DIAGNOSIS — Z79891 Long term (current) use of opiate analgesic: Secondary | ICD-10-CM | POA: Diagnosis not present

## 2019-12-06 DIAGNOSIS — M24551 Contracture, right hip: Secondary | ICD-10-CM

## 2019-12-06 DIAGNOSIS — Z5181 Encounter for therapeutic drug level monitoring: Secondary | ICD-10-CM | POA: Diagnosis not present

## 2019-12-06 DIAGNOSIS — M4126 Other idiopathic scoliosis, lumbar region: Secondary | ICD-10-CM | POA: Insufficient documentation

## 2019-12-06 MED ORDER — HYDROCODONE-ACETAMINOPHEN 10-325 MG PO TABS: 1.0000 | ORAL_TABLET | Freq: Three times a day (TID) | ORAL | 0 refills | Status: DC | PRN

## 2019-12-06 NOTE — Progress Notes (Signed)
Subjective:    Patient ID: Hannah Scott, female    DOB: [birth year], 69 y.o.   MRN: [id number]  HPI: Hannah Scott is a 69 y.o. female who returns for follow up appointment for chronic pain and medication refill. She states her pain is located in her lower back. She rates her pain 6. Her current exercise regime is walking with her walker.  Hannah Scott Morphine equivalent is 30.30 MME.  Last Oral Swab was Performed on 10/11/2019, it was consistent.   Pain Inventory Average Pain 7 Pain Right Now 6 My pain is constant, burning, tingling and aching  In the last 24 hours, has pain interfered with the following? General activity 10 Relation with others 10 Enjoyment of life 10 What TIME of day is your pain at its worst? morning , daytime, evening and night Sleep (in general) Poor  Pain is worse with: walking and standing Pain improves with: rest and medication Relief from Meds: 6  Family History  Problem Relation Age of Onset  . Heart failure Mother   . Pneumonia Mother   . Hypertension Mother   . Heart disease Mother   . Stroke Maternal Grandfather   . Hypertension Maternal Grandfather   . Heart attack Father   . Heart disease Father   . Asthma Paternal Uncle        PAT UNCLES  . Heart attack Paternal Uncle    Social History   Socioeconomic History  . Marital status: Widowed    Spouse name: Not on file  . Number of children: Not on file  . Years of education: Not on file  . Highest education level: Not on file  Occupational History  . Not on file  Tobacco Use  . Smoking status: Never Smoker  . Smokeless tobacco: Never Used  Substance and Sexual Activity  . Alcohol use: No    Alcohol/week: 0.0 standard drinks  . Drug use: No  . Sexual activity: Never    Birth control/protection: Surgical  Other Topics Concern  . Not on file  Social History Narrative  . Not on file   Social Determinants of Health   Financial Resource Strain: Low Risk   . Difficulty of Paying Living  Expenses: Not very hard  Food Insecurity:   . Worried About Charity fundraiser in the Last Year:   . Arboriculturist in the Last Year:   Transportation Needs: No Transportation Needs  . Lack of Transportation (Medical): No  . Lack of Transportation (Non-Medical): No  Physical Activity:   . Days of Exercise per Week:   . Minutes of Exercise per Session:   Stress:   . Feeling of Stress :   Social Connections:   . Frequency of Communication with Friends and Family:   . Frequency of Social Gatherings with Friends and Family:   . Attends Religious Services:   . Active Member of Clubs or Organizations:   . Attends Archivist Meetings:   [name] Marital Status:    Past Surgical History:  Procedure Laterality Date  . ANKLE SURGERY Left    ligament  . APPENDECTOMY  1987   AT [facility]  . BACK SURGERY     Fusion  . BREAST LUMPECTOMY  2003   radiation on right  . BREAST LUMPECTOMY WITH RADIOACTIVE SEED LOCALIZATION Left 02/12/2016   Procedure: LEFT BREAST LUMPECTOMY WITH RADIOACTIVE SEED LOCALIZATION;  Surgeon: Excell Seltzer, MD;  Location: [place];  Service: General;  Laterality: Left;  . CARDIOVASCULAR STRESS TEST  09/07/05   Nuclear, was negative  . CATARACT EXTRACTION Bilateral 01,03  . OOPHORECTOMY     LSO -RSO  . PELVIC LAPAROSCOPY  1989   RSO, LYSIS OF ADHESIONS  . TOTAL ABDOMINAL HYSTERECTOMY  1987   LSO, APPENDECTOMY  . TOTAL HIP ARTHROPLASTY Right 10/28/2013   dr Lorin Mercy  . TOTAL HIP ARTHROPLASTY Right 10/28/2013   Procedure: TOTAL HIP ARTHROPLASTY ANTERIOR APPROACH;  Surgeon: Marybelle Killings, MD;  Location: [facility];  Service: Orthopedics;  Laterality: Right;  Right Total Hip Arthroplasty, Direct Anterior Approach   Past Surgical History:  Procedure Laterality Date  . ANKLE SURGERY Left    ligament  . APPENDECTOMY  1987   AT [facility]  . BACK SURGERY     Fusion  . BREAST LUMPECTOMY  2003   radiation on right  . BREAST LUMPECTOMY WITH RADIOACTIVE SEED  LOCALIZATION Left 02/12/2016   Procedure: LEFT BREAST LUMPECTOMY WITH RADIOACTIVE SEED LOCALIZATION;  Surgeon: Excell Seltzer, MD;  Location: [facility];  Service: General;  Laterality: Left;  . CARDIOVASCULAR STRESS TEST  09/07/05   Nuclear, was negative  . CATARACT EXTRACTION Bilateral 01,03  . OOPHORECTOMY     LSO -RSO  . PELVIC LAPAROSCOPY  1989   RSO, LYSIS OF ADHESIONS  . TOTAL ABDOMINAL HYSTERECTOMY  1987   LSO, APPENDECTOMY  . TOTAL HIP ARTHROPLASTY Right 10/28/2013   dr Lorin Mercy  . TOTAL HIP ARTHROPLASTY Right 10/28/2013   Procedure: TOTAL HIP ARTHROPLASTY ANTERIOR APPROACH;  Surgeon: Marybelle Killings, MD;  Location: [place] [facility];  Service: Orthopedics;  Laterality: Right;  Right Total Hip Arthroplasty, Direct Anterior Approach   Past Medical History:  Diagnosis Date  . Anemia    hx  . Arthritis   . Asthma    PFTs, February, 2011, moderate obstructive disease with response to bronchodilators, normal lung volumes, moderate reduction in diffusing capacity  . Atrial septal aneurysm    Echo, 2008-not noted on 13 echo  . CAD (coronary artery disease)    90% distal LAD in the past  /   nuclear, 2008, no ischemia, ejection fraction 70%  . Cancer ([place]) 2002   DUCTAL CIS--S/P LUMPECTOMY, RADIATION AND 6 WEEKS OF TAMOXIFEN  . D-dimer, elevated    January, 2014  . Depression   . Ejection fraction    EF 60%, echo, October, 2008  . Elevated CPK    January, 2014  . Endometriosis 1989   RIGHT TUBE  . Endometriosis 1987   LEFT TUBE/OVARY W FOCAL IN-SITU ENDOMETRIAL ADENOCARCINOMA  . GERD (gastroesophageal reflux disease)    occ  . H/O hiatal hernia    ?  [name] Hyperlipidemia   . Hypertension   . Hypothyroidism    Patient has had in the past that she does not need treatment  . Kyphoscoliosis   . Obstructive airway disease ([place])   . Pinched nerve    lower back  . Shortness of breath   . UTI (lower urinary tract infection)    BP 117/78   Pulse 67   Temp 98.2 F (36.8 C)    Ht 5' (1.524 m)   Wt 165 lb 3.2 oz (74.9 kg)   SpO2 94%   BMI 32.26 kg/m   Opioid Risk Score:   Fall Risk Score:  `1  Depression screen PHQ 2/9  Depression screen [facility] 2/9 12/06/2019 11/08/2019 10/11/2019 09/11/2019 09/11/2018 05/16/2018 01/17/2018  Decreased Interest 3 3 3  0 2 1 1  Down, Depressed, Hopeless 2 2 3 1 2 1 1   PHQ - 2 Score 5 5 6 1 4 2 2   Altered sleeping - - 3 - - - -  Tired, decreased energy - - 3 - - - -  Change in appetite - - 3 - - - -  Feeling bad or failure about yourself  - - 0 - - - -  Trouble concentrating - - 2 - - - -  Moving slowly or fidgety/restless - - 0 - - - -  Suicidal thoughts - - 0 - - - -  PHQ-9 Score - - 17 - - - -  Difficult doing work/chores - - - - - - -  Some recent data might be hidden    Review of Systems  Constitutional: Negative.   HENT: Negative.   Eyes: Negative.   Respiratory: Negative.   Cardiovascular: Negative.   Gastrointestinal: Negative.   Endocrine: Negative.   Genitourinary: Negative.   Musculoskeletal: Positive for back pain.  Skin: Negative.   Allergic/Immunologic: Negative.   Hematological: Negative.   Psychiatric/Behavioral: Negative.   All other systems reviewed and are negative.      Objective:   Physical Exam Vitals and nursing note reviewed.  Constitutional:      Appearance: Normal appearance.  Cardiovascular:     Rate and Rhythm: Normal rate and regular rhythm.     Pulses: Normal pulses.     Heart sounds: Normal heart sounds.  Pulmonary:     Effort: Pulmonary effort is normal.     Breath sounds: Normal breath sounds.  Musculoskeletal:     Cervical back: Normal range of motion and neck supple.     Comments: Normal Muscle Bulk and Muscle Testing Reveals:  Upper Extremities: Full ROM and Muscle Strength 5/5 Lumbar Paraspinal Tenderness: L-4-L-5 Lower Extremities: Full ROM and Muscle Strength  Arises from Table Slowly using walker for support Narrow Based  Gait   Skin:    General: Skin is warm and  dry.  Neurological:     Mental Status: She is alert and oriented to person, place, and time.  Psychiatric:        Mood and Affect: Mood normal.        Behavior: Behavior normal.           Assessment & Plan:  1.Lumbar post laminectomy syndromewith severe kyphoscoliosis thoracolumbar spine, s/p lumbar fusion/ 12/06/2019 Continue current medication regimen.Refilled: Hydrocodone 10/325mg  one tablet every8hours may take an extra tablet when pain is severe#100. We will continue the opioid monitoring program, this consists of regular clinic visits, examinations, urine drug screen, pill counts as well as use of [place] Controlled Substance Reporting system. 2. Right Hip OA: S/P Right Hip Replacement 10/28/2013.12/06/2019 3.Depression: Continue Cymbalta. Has Family Support and Friends. Counseling with Doristine Bosworth.12/06/2019. 4.RightGreater Trochanteric Tenderness:No complaints today.Continue to Monitor. Continue with Ice and Heat Therapy.12/06/2019  F/U in 1 month  20  minutes of face to face patient care time was spent during this visit. All questions were encouraged and answered.

## 2019-12-11 ENCOUNTER — Other Ambulatory Visit: Payer: Self-pay | Admitting: Cardiology

## 2019-12-12 NOTE — Patient Instructions (Addendum)
Calcium 600 mg twice a day.  Vitamin d - will advise after labs   Blood work was ordered.     Medications reviewed and updated.  Changes include :   none  Your prescription(s) have been submitted to your pharmacy. Please take as directed and contact our office if you believe you are having problem(s) with the medication(s).   A bone density was ordered   Please followup in 6 months

## 2019-12-12 NOTE — Progress Notes (Signed)
Subjective:    Patient ID: Hannah Scott, female    DOB: May 23, 1950, 69 y.o.   MRN: [id number]  HPI The patient is here for follow up of their chronic medical problems, including htn, prediabetes, hyperlipidemia, hypothyroidism, depression   She is taking all of her medications as prescribed.   Her BP at home has been 106/65, 112/68, 98/60, 93/61, 104/75, 116/69, 88/57, 109/65, 112/78, 102/62, 97/60, 92/61, 128/71  In reviewing meds she is taking 40 mg of benazepril instead of 20 mg.    She is eating the wrong things - eats sandwiches for dinner.  She tries to eat vegetables and fruits, but still does not feel she eats as well as she should be.  She still feels very depressed and at times wants to cry a lot.  When she sees her granddaughter she feels better.  She is only able to see her once-twice a week and wishes she could see her more.  She is also depressed about the fact that she cannot do a lot of things with her like pick her up and carry her because of her physical problems.  Medications and allergies reviewed with patient and updated if appropriate.  Patient Active Problem List   Diagnosis Date Noted  . Insomnia 09/11/2019  . Murmur, cardiac 01/16/2018  . Moderate persistent asthma with exacerbation 12/13/2017  . Osteoporosis 01/07/2017  . Acute bronchitis 10/17/2016  . Prediabetes 07/07/2016  . History of breast cancer 01/06/2016  . Intraductal papilloma of left breast 01/06/2016  . Essential hypertension 06/08/2015  . DOE (dyspnea on exertion) 06/08/2015  . Fatigue 05/20/2015  . Back pain 04/22/2015  . Anemia due to blood loss 11/27/2013  . Elevated CPK   . Multinodular goiter 12/01/2011  . Asthma   . Hypothyroidism   . CAD (coronary artery disease)   . Atrial septal aneurysm   . Hyperlipidemia   . Scoliosis   . Spinal stenosis, lumbar region, with neurogenic claudication 07/01/2011  . Lumbar postlaminectomy syndrome 07/01/2011  . Osteoarthritis of right hip  07/01/2011  . Contracture of right hip 07/01/2011  . Endometriosis   . Depression 12/24/2009  . Migraine headache 05/19/2006    Current Outpatient Medications on File Prior to Visit  Medication Sig Dispense Refill  . albuterol (PROVENTIL HFA;VENTOLIN HFA) 108 (90 Base) MCG/ACT inhaler Inhale 2 puffs into the lungs every 4 (four) hours as needed for wheezing or shortness of breath. 1 Inhaler 0  . atorvastatin (LIPITOR) 20 MG tablet TABLET 1 TABLET BY MOUTH DAILY. PLEASE KEEP UPCOMING APPT IN AUGUST WITH DOCTOR NELSON BEFORE ANYMORE REFILLS. FINAL ATTEMPT. 30 tablet 0  . benazepril (LOTENSIN) 40 MG tablet Take 0.5 tablets (20 mg total) by mouth daily. 90 tablet 2  . BREO ELLIPTA 100-25 MCG/INH AEPB INHALE 1 PUFF INTO THE LUNGS DAILY 60 each 1  . DULoxetine (CYMBALTA) 60 MG capsule Take 1 capsule (60 mg total) by mouth daily. Do NOT chew, crush, or open capsule.... Annual appt due in Sept must see provider for future refills 90 capsule 0  . gabapentin (NEURONTIN) 600 MG tablet TAKE ONE TABLET EVERY DAY AT BEDTIME 30 tablet 5  . hydrochlorothiazide (HYDRODIURIL) 25 MG tablet Take 1 tablet (25 mg total) by mouth daily. (Patient taking differently: Take 12.5 mg by mouth daily. ) 90 tablet 3  . HYDROcodone-acetaminophen (NORCO) 10-325 MG tablet Take 1 tablet by mouth every 8 (eight) hours as needed. 100 tablet 0  . ipratropium-albuterol (DUONEB) 0.5-2.5 (3) MG/3ML SOLN  Take 3 mLs by nebulization every 4 (four) hours as needed (wheezing or SOB). During exacerbation 360 mL 1  . levothyroxine (SYNTHROID) 75 MCG tablet TAKE 1 TABLET BY MOUTH DAILY 90 tablet 0  . metoprolol tartrate (LOPRESSOR) 50 MG tablet Take 2 tablets (100 mg total) by mouth 2 (two) times daily. Please keep upcoming appt with Dr. Meda Coffee in August before anymore refills. Final Attempt 360 tablet 0   No current facility-administered medications on file prior to visit.    Past Medical History:  Diagnosis Date  . Anemia    hx  .  Arthritis   . Asthma    PFTs, February, 2011, moderate obstructive disease with response to bronchodilators, normal lung volumes, moderate reduction in diffusing capacity  . Atrial septal aneurysm    Echo, 2008-not noted on 13 echo  . CAD (coronary artery disease)    90% distal LAD in the past  /   nuclear, 2008, no ischemia, ejection fraction 70%  . Cancer ([place]) 2002   DUCTAL CIS--S/P LUMPECTOMY, RADIATION AND 6 WEEKS OF TAMOXIFEN  . D-dimer, elevated    January, 2014  . Depression   . Ejection fraction    EF 60%, echo, October, 2008  . Elevated CPK    January, 2014  . Endometriosis 1989   RIGHT TUBE  . Endometriosis 1987   LEFT TUBE/OVARY W FOCAL IN-SITU ENDOMETRIAL ADENOCARCINOMA  . GERD (gastroesophageal reflux disease)    occ  . H/O hiatal hernia    ?  [name] Hyperlipidemia   . Hypertension   . Hypothyroidism    Patient has had in the past that she does not need treatment  . Kyphoscoliosis   . Obstructive airway disease ([place])   . Pinched nerve    lower back  . Shortness of breath   . UTI (lower urinary tract infection)     Past Surgical History:  Procedure Laterality Date  . ANKLE SURGERY Left    ligament  . APPENDECTOMY  1987   AT [facility]  . BACK SURGERY     Fusion  . BREAST LUMPECTOMY  2003   radiation on right  . BREAST LUMPECTOMY WITH RADIOACTIVE SEED LOCALIZATION Left 02/12/2016   Procedure: LEFT BREAST LUMPECTOMY WITH RADIOACTIVE SEED LOCALIZATION;  Surgeon: Excell Seltzer, MD;  Location: [place];  Service: General;  Laterality: Left;  . CARDIOVASCULAR STRESS TEST  09/07/05   Nuclear, was negative  . CATARACT EXTRACTION Bilateral 01,03  . OOPHORECTOMY     LSO -RSO  . PELVIC LAPAROSCOPY  1989   RSO, LYSIS OF ADHESIONS  . TOTAL ABDOMINAL HYSTERECTOMY  1987   LSO, APPENDECTOMY  . TOTAL HIP ARTHROPLASTY Right 10/28/2013   dr Lorin Mercy  . TOTAL HIP ARTHROPLASTY Right 10/28/2013   Procedure: TOTAL HIP ARTHROPLASTY ANTERIOR APPROACH;  Surgeon: Marybelle Killings, MD;  Location: [place];  Service: Orthopedics;  Laterality: Right;  Right Total Hip Arthroplasty, Direct Anterior Approach    Social History   Socioeconomic History  . Marital status: Widowed    Spouse name: Not on file  . Number of children: Not on file  . Years of education: Not on file  . Highest education level: Not on file  Occupational History  . Not on file  Tobacco Use  . Smoking status: Never Smoker  . Smokeless tobacco: Never Used  Substance and Sexual Activity  . Alcohol use: No    Alcohol/week: 0.0 standard drinks  . Drug use: No  . Sexual activity: Never  Birth control/protection: Surgical  Other Topics Concern  . Not on file  Social History Narrative  . Not on file   Social Determinants of Health   Financial Resource Strain: Low Risk   . Difficulty of Paying Living Expenses: Not very hard  Food Insecurity:   . Worried About Charity fundraiser in the Last Year: Not on file  . Ran Out of Food in the Last Year: Not on file  Transportation Needs: No Transportation Needs  . Lack of Transportation (Medical): No  . Lack of Transportation (Non-Medical): No  Physical Activity:   . Days of Exercise per Week: Not on file  . Minutes of Exercise per Session: Not on file  Stress:   . Feeling of Stress : Not on file  Social Connections:   . Frequency of Communication with Friends and Family: Not on file  . Frequency of Social Gatherings with Friends and Family: Not on file  . Attends Religious Services: Not on file  . Active Member of Clubs or Organizations: Not on file  . Attends Archivist Meetings: Not on file  . Marital Status: Not on file    Family History  Problem Relation Age of Onset  . Heart failure Mother   . Pneumonia Mother   . Hypertension Mother   . Heart disease Mother   . Stroke Maternal Grandfather   . Hypertension Maternal Grandfather   . Heart attack Father   . Heart disease Father   . Asthma Paternal Uncle        PAT  UNCLES  . Heart attack Paternal Uncle     Review of Systems  Constitutional: Negative for chills and fever.  HENT: Positive for postnasal drip and voice change (hoarseness intermittent).   Respiratory: Positive for cough, shortness of breath and wheezing.   Cardiovascular: Negative for chest pain, palpitations and leg swelling.  Neurological: Positive for light-headedness (with low BP) and headaches (occ).       Objective:   Vitals:   12/13/19 1353  BP: 110/68  Pulse: 78  Temp: 98.2 F (36.8 C)  SpO2: 92%   BP Readings from Last 3 Encounters:  12/13/19 110/68  12/06/19 117/78  11/08/19 111/78   Wt Readings from Last 3 Encounters:  12/13/19 167 lb (75.8 kg)  12/06/19 165 lb 3.2 oz (74.9 kg)  11/08/19 165 lb 6.4 oz (75 kg)   Body mass index is 32.61 kg/m.   Physical Exam    Constitutional: Appears well-developed and well-nourished. No distress.  HENT:  Head: Normocephalic and atraumatic.  Neck: Neck supple. No tracheal deviation present. No thyromegaly present.  No cervical lymphadenopathy Cardiovascular: Normal rate, regular rhythm and normal heart sounds.   No murmur heard. No carotid bruit .  No edema Pulmonary/Chest: Effort normal and breath sounds normal. No respiratory distress. No has no wheezes. No rales.  Skin: Skin is warm and dry. Not diaphoretic.  Psychiatric: Normal mood and affect. Behavior is normal.      Assessment & Plan:    See Problem List for Assessment and Plan of chronic medical problems.    This visit occurred during the SARS-CoV-2 public health emergency.  Safety protocols were in place, including screening questions prior to the visit, additional usage of staff PPE, and extensive cleaning of exam room while observing appropriate contact time as indicated for disinfecting solutions.

## 2019-12-13 ENCOUNTER — Ambulatory Visit (INDEPENDENT_AMBULATORY_CARE_PROVIDER_SITE_OTHER): Payer: Medicare Other | Admitting: Internal Medicine

## 2019-12-13 ENCOUNTER — Other Ambulatory Visit: Payer: Self-pay

## 2019-12-13 ENCOUNTER — Encounter: Payer: Self-pay | Admitting: Internal Medicine

## 2019-12-13 VITALS — BP 110/68 | HR 78 | Temp 98.2°F | Ht 60.0 in | Wt 167.0 lb

## 2019-12-13 DIAGNOSIS — I10 Essential (primary) hypertension: Secondary | ICD-10-CM

## 2019-12-13 DIAGNOSIS — E039 Hypothyroidism, unspecified: Secondary | ICD-10-CM

## 2019-12-13 DIAGNOSIS — R7303 Prediabetes: Secondary | ICD-10-CM | POA: Diagnosis not present

## 2019-12-13 DIAGNOSIS — F3289 Other specified depressive episodes: Secondary | ICD-10-CM | POA: Diagnosis not present

## 2019-12-13 DIAGNOSIS — E7849 Other hyperlipidemia: Secondary | ICD-10-CM | POA: Diagnosis not present

## 2019-12-13 DIAGNOSIS — M810 Age-related osteoporosis without current pathological fracture: Secondary | ICD-10-CM

## 2019-12-13 MED ORDER — BENAZEPRIL HCL 20 MG PO TABS: 20.0000 mg | ORAL_TABLET | Freq: Every day | ORAL | 3 refills | Status: DC

## 2019-12-13 NOTE — Assessment & Plan Note (Signed)
Chronic Check a1c 

## 2019-12-13 NOTE — Assessment & Plan Note (Addendum)
chronic BP too low and she is symptomatic I was under the impression she was taking benazepril 20 mg daily, but she has been taking 40 mg daily-decrease to 20 mg daily Continue other medications She will monitor her BP at home and let me know if it goes too high or continues to be low  CMP, CBC

## 2019-12-13 NOTE — Assessment & Plan Note (Signed)
Chronic Check lipid panel  Continue daily statin Regular exercise and healthy diet encouraged  

## 2019-12-13 NOTE — Assessment & Plan Note (Signed)
Chronic Not controlled Did not tolerate remeron in the past Discussed changing medication or referral to psych - she deferred now She will try to see her granddaughter as much as possible which is what really helps

## 2019-12-13 NOTE — Assessment & Plan Note (Addendum)
Chronic  Clinically euthyroid Check tsh  Titrate med dose if needed  

## 2019-12-13 NOTE — Assessment & Plan Note (Signed)
Chronic She is not currently taking calcium and vitamin D-advised calcium 600 mg twice daily and will determine how much vitamin D she should take based on vitamin D level, which will be checked today DEXA ordered She is unsure if she wants to consider medication for her osteoporosis or not-we will see what DEXA shows

## 2019-12-14 LAB — CBC WITH DIFFERENTIAL/PLATELET
Absolute Monocytes: 422 cells/uL (ref 200–950)
Basophils Absolute: 29 cells/uL (ref 0–200)
Basophils Relative: 0.6 %
Eosinophils Absolute: 461 cells/uL (ref 15–500)
Eosinophils Relative: 9.6 %
HCT: 40.9 % (ref 35.0–45.0)
Hemoglobin: 12.9 g/dL (ref 11.7–15.5)
Lymphs Abs: 1008 cells/uL (ref 850–3900)
MCH: 28.8 pg (ref 27.0–33.0)
MCHC: 31.5 g/dL — ABNORMAL LOW (ref 32.0–36.0)
MCV: 91.3 fL (ref 80.0–100.0)
MPV: 11.3 fL (ref 7.5–12.5)
Monocytes Relative: 8.8 %
Neutro Abs: 2880 cells/uL (ref 1500–7800)
Neutrophils Relative %: 60 %
Platelets: 222 10*3/uL (ref 140–400)
RBC: 4.48 10*6/uL (ref 3.80–5.10)
RDW: 13.3 % (ref 11.0–15.0)
Total Lymphocyte: 21 %
WBC: 4.8 10*3/uL (ref 3.8–10.8)

## 2019-12-14 LAB — COMPLETE METABOLIC PANEL WITH GFR
AG Ratio: 2.1 (calc) (ref 1.0–2.5)
ALT: 13 U/L (ref 6–29)
AST: 20 U/L (ref 10–35)
Albumin: 3.9 g/dL (ref 3.6–5.1)
Alkaline phosphatase (APISO): 86 U/L (ref 37–153)
BUN: 17 mg/dL (ref 7–25)
CO2: 37 mmol/L — ABNORMAL HIGH (ref 20–32)
Calcium: 9.1 mg/dL (ref 8.6–10.4)
Chloride: 98 mmol/L (ref 98–110)
Creat: 0.82 mg/dL (ref 0.60–0.93)
GFR, Est African American: 84 mL/min/{1.73_m2} (ref >=60)
GFR, Est Non African American: 72 mL/min/{1.73_m2} (ref >=60)
Globulin: 1.9 g/dL (calc) (ref 1.9–3.7)
Glucose, Bld: 76 mg/dL (ref 65–99)
Potassium: 4.5 mmol/L (ref 3.5–5.3)
Sodium: 138 mmol/L (ref 135–146)
Total Bilirubin: 0.7 mg/dL (ref 0.2–1.2)
Total Protein: 5.8 g/dL — ABNORMAL LOW (ref 6.1–8.1)

## 2019-12-14 LAB — LIPID PANEL
Cholesterol: 132 mg/dL (ref <200)
HDL: 52 mg/dL (ref >=50)
LDL Cholesterol (Calc): 61 mg/dL (calc)
Non-HDL Cholesterol (Calc): 80 mg/dL (calc) (ref <130)
Total CHOL/HDL Ratio: 2.5 (calc) (ref <5.0)
Triglycerides: 106 mg/dL (ref <150)

## 2019-12-14 LAB — HEMOGLOBIN A1C
Hgb A1c MFr Bld: 5.8 % of total Hgb — ABNORMAL HIGH (ref <5.7)
Mean Plasma Glucose: 120 (calc)
eAG (mmol/L): 6.6 (calc)

## 2019-12-14 LAB — VITAMIN D 25 HYDROXY (VIT D DEFICIENCY, FRACTURES): Vit D, 25-Hydroxy: 18 ng/mL — ABNORMAL LOW (ref 30–100)

## 2019-12-14 LAB — TSH: TSH: 1.1 mIU/L (ref 0.40–4.50)

## 2019-12-19 ENCOUNTER — Encounter: Payer: Self-pay | Admitting: Cardiology

## 2019-12-19 ENCOUNTER — Encounter: Payer: Self-pay | Admitting: *Deleted

## 2019-12-19 ENCOUNTER — Ambulatory Visit: Payer: Medicare Other | Admitting: Cardiology

## 2019-12-19 ENCOUNTER — Other Ambulatory Visit: Payer: Self-pay

## 2019-12-19 VITALS — BP 124/74 | HR 71 | Ht 60.0 in | Wt 166.0 lb

## 2019-12-19 DIAGNOSIS — I10 Essential (primary) hypertension: Secondary | ICD-10-CM

## 2019-12-19 DIAGNOSIS — E782 Mixed hyperlipidemia: Secondary | ICD-10-CM

## 2019-12-19 DIAGNOSIS — R0600 Dyspnea, unspecified: Secondary | ICD-10-CM | POA: Diagnosis not present

## 2019-12-19 DIAGNOSIS — I2510 Atherosclerotic heart disease of native coronary artery without angina pectoris: Secondary | ICD-10-CM | POA: Diagnosis not present

## 2019-12-19 DIAGNOSIS — R0609 Other forms of dyspnea: Secondary | ICD-10-CM

## 2019-12-19 NOTE — Progress Notes (Signed)
Patient ID: Hannah Scott, female   DOB: 1951/04/20, 69 y.o.   MRN: [id number]      Cardiology Office Note  Date:  12/19/2019   ID:  Hannah Scott, DOB [birth year], MRN [id number]  PCP:  Binnie Rail, MD  Cardiologist:  Ena Dawley, MD , previously Dr Ron Parker  Chief complain: Dyspnea on exertion   History of Present Illness:  Hannah Scott is a 69 y.o. female with h/o 2 prior MIs and to cardiac catheterization the last one in 1999. She was told at the time that her myocardial infarction was result of coronary spasm and she didn't get any stents placed. She has known distal LAD disease.  She has been followed by Dr. Ron Parker and was stable, her last stress test was done in December 2014 where she had mild ischemia and conservative management was decided. She was taking amlodipine for years but developed significant lower extremity edema that resolved after she discontinued. In December 2016 she was hospitalized with acute respiratory failure secondary to asthma attack triggered by influenza A. She states that despite recovery from these her shortness of breath has never resolved. She feels short of breath intermittently on exertion and at rest. She is currently not wheezing and doesn't need to use rescue inhalers.  The patient is coming for regular follow-up, she is overall doing well, she has noticed worsening dyspnea on exertion worse than her baseline asthma but no chest pain.  She denies any palpitation dizziness or syncope.  No lower extremity edema.  She has been experiencing orthostatic hypotension episodes and has noted that her blood pressure gets as low as 90.  Her primary care physician decreased dosage of her blood pressure medications last week with improvement of symptoms.  Past Medical History:  Diagnosis Date  . Anemia    hx  . Arthritis   . Asthma    PFTs, February, 2011, moderate obstructive disease with response to bronchodilators, normal lung volumes, moderate reduction in diffusing  capacity  . Atrial septal aneurysm    Echo, 2008-not noted on 13 echo  . CAD (coronary artery disease)    90% distal LAD in the past  /   nuclear, 2008, no ischemia, ejection fraction 70%  . Cancer (May) 2002   DUCTAL CIS--S/P LUMPECTOMY, RADIATION AND 6 WEEKS OF TAMOXIFEN  . D-dimer, elevated    January, 2014  . Depression   . Ejection fraction    EF 60%, echo, October, 2008  . Elevated CPK    January, 2014  . Endometriosis 1989   RIGHT TUBE  . Endometriosis 1987   LEFT TUBE/OVARY W FOCAL IN-SITU ENDOMETRIAL ADENOCARCINOMA  . GERD (gastroesophageal reflux disease)    occ  . H/O hiatal hernia    ?  [name] Hyperlipidemia   . Hypertension   . Hypothyroidism    Patient has had in the past that she does not need treatment  . Kyphoscoliosis   . Obstructive airway disease ([place])   . Pinched nerve    lower back  . Shortness of breath   . UTI (lower urinary tract infection)    Past Surgical History:  Procedure Laterality Date  . ANKLE SURGERY Left    ligament  . APPENDECTOMY  1987   AT [facility]  . BACK SURGERY     Fusion  . BREAST LUMPECTOMY  2003   radiation on right  . BREAST LUMPECTOMY WITH RADIOACTIVE SEED LOCALIZATION Left 02/12/2016   Procedure: LEFT BREAST LUMPECTOMY WITH RADIOACTIVE SEED  LOCALIZATION;  Surgeon: Excell Seltzer, MD;  Location: [facility];  Service: General;  Laterality: Left;  . CARDIOVASCULAR STRESS TEST  09/07/05   Nuclear, was negative  . CATARACT EXTRACTION Bilateral 01,03  . OOPHORECTOMY     LSO -RSO  . PELVIC LAPAROSCOPY  1989   RSO, LYSIS OF ADHESIONS  . TOTAL ABDOMINAL HYSTERECTOMY  1987   LSO, APPENDECTOMY  . TOTAL HIP ARTHROPLASTY Right 10/28/2013   dr Lorin Mercy  . TOTAL HIP ARTHROPLASTY Right 10/28/2013   Procedure: TOTAL HIP ARTHROPLASTY ANTERIOR APPROACH;  Surgeon: Marybelle Killings, MD;  Location: [facility];  Service: Orthopedics;  Laterality: Right;  Right Total Hip Arthroplasty, Direct Anterior Approach    Current Outpatient Medications   Medication Sig Dispense Refill  . albuterol (PROVENTIL HFA;VENTOLIN HFA) 108 (90 Base) MCG/ACT inhaler Inhale 2 puffs into the lungs every 4 (four) hours as needed for wheezing or shortness of breath. 1 Inhaler 0  . atorvastatin (LIPITOR) 20 MG tablet TABLET 1 TABLET BY MOUTH DAILY. PLEASE KEEP UPCOMING APPT IN AUGUST WITH DOCTOR Yulieth Carrender BEFORE ANYMORE REFILLS. FINAL ATTEMPT. 30 tablet 0  . benazepril (LOTENSIN) 20 MG tablet Take 1 tablet (20 mg total) by mouth daily. 90 tablet 3  . BREO ELLIPTA 100-25 MCG/INH AEPB INHALE 1 PUFF INTO THE LUNGS DAILY 60 each 1  . DULoxetine (CYMBALTA) 60 MG capsule Take 1 capsule (60 mg total) by mouth daily. Do NOT chew, crush, or open capsule.... Annual appt due in Sept must see provider for future refills 90 capsule 0  . gabapentin (NEURONTIN) 600 MG tablet TAKE ONE TABLET EVERY DAY AT BEDTIME 30 tablet 5  . hydrochlorothiazide (HYDRODIURIL) 25 MG tablet Take 1 tablet (25 mg total) by mouth daily. 90 tablet 3  . HYDROcodone-acetaminophen (NORCO) 10-325 MG tablet Take 1 tablet by mouth every 8 (eight) hours as needed. 100 tablet 0  . ipratropium-albuterol (DUONEB) 0.5-2.5 (3) MG/3ML SOLN Take 3 mLs by nebulization every 4 (four) hours as needed (wheezing or SOB). During exacerbation 360 mL 1  . levothyroxine (SYNTHROID) 75 MCG tablet TAKE 1 TABLET BY MOUTH DAILY 90 tablet 0  . metoprolol tartrate (LOPRESSOR) 50 MG tablet Take 2 tablets (100 mg total) by mouth 2 (two) times daily. Please keep upcoming appt with Dr. Meda Coffee in August before anymore refills. Final Attempt 360 tablet 0   No current facility-administered medications for this visit.    Allergies:   Fluticasone-salmeterol, Norvasc [amlodipine besylate], and Tape    Social History:  The patient  reports that she has never smoked. She has never used smokeless tobacco. She reports that she does not drink alcohol and does not use drugs.   Family History:  The patient's family history includes Asthma in  her paternal uncle; Heart attack in her father and paternal uncle; Heart disease in her father and mother; Heart failure in her mother; Hypertension in her maternal grandfather and mother; Pneumonia in her mother; Stroke in her maternal grandfather.   ROS:  Please see the history of present illness.   Otherwise, review of systems are positive for none.   All other systems are reviewed and negative.   PHYSICAL EXAM: VS:  BP 124/74   Pulse 71   Ht 5' (1.524 m)   Wt 166 lb (75.3 kg)   SpO2 97%   BMI 32.42 kg/m  , BMI Body mass index is 32.42 kg/m. GEN: Well nourished, well developed, in no acute distress  HEENT: normal  Neck: no JVD, carotid bruits,  or masses Cardiac: RRR; no murmurs, rubs, or gallops,no edema  Respiratory:  clear to auscultation bilaterally, normal work of breathing GI: soft, nontender, nondistended, + BS MS: no deformity or atrophy  Skin: warm and dry, no rash Neuro:  Strength and sensation are intact Psych: euthymic mood, full affect  EKG:  EKG is ordered today and personally reviewed, and she was normal sinus rhythm, normal EKG, unchanged from prior.  Recent Labs: 12/13/2019: ALT 13; BUN 17; Creat 0.82; Hemoglobin 12.9; Platelets 222; Potassium 4.5; Sodium 138; TSH 1.10   Lipid Panel    Component Value Date/Time   CHOL 132 12/13/2019 1430   TRIG 106 12/13/2019 1430   HDL 52 12/13/2019 1430   CHOLHDL 2.5 12/13/2019 1430   VLDL 17.6 07/10/2019 1501   LDLCALC 61 12/13/2019 1430   LDLDIRECT 154.8 02/20/2009 1116     Wt Readings from Last 3 Encounters:  12/19/19 166 lb (75.3 kg)  12/13/19 167 lb (75.8 kg)  12/06/19 165 lb 3.2 oz (74.9 kg)    TTE: 08/31/2011 Left ventricle: The cavity size was normal. Wall thickness was increased in a pattern of mild LVH. Systolic function was normal. The estimated ejection fraction was 55%. Wall motion was normal; there were no regional wall motion abnormalities. - Left atrium: The atrium was mildly dilated. -  Atrial septum: No defect or patent foramen ovale was identified. - Pulmonary arteries: PA peak pressure: 62mm Hg (S).  Lexiscan nuclear stress test: 06/30/2015  Nuclear stress EF: 55%.  There was no ST segment deviation noted during stress.  Defect 1: There is a small defect of mild severity present in the apical anterior location. This may represent distal LAD disease vs. apical thinning. There is no evidence of reversible ischemia  This is a low risk study.    ASSESSMENT AND PLAN:  1. Coronary artery disease  - known LAD disease and prior myocardial infarctions, Normal stress test on 06/30/2015, now worsening symptoms, we tried to obtain coronary CTA but she was not able to get IV access, we will repeat nuclear stress test since it has been 4 years since the last 1 on our D SPECT machine.  2. Hyperlipidemia  - on atorvastatin 20 mg daily, tolerated well, will continue.  All of her lipids were at goal April 2021.  3. Hypertension  -recently her medications have been decreased as she was experiencing orthostatic hypotension that has resolved.  4. Hypothyroidism on Synthroid, most recent TSH normal.  If stress test normal follow-up in 6 months.  Signed, Ena Dawley, MD  12/19/2019 2:25 PM    [facility] [place], [place], [place]  [address] Phone: [phone]; Fax: [phone]

## 2019-12-19 NOTE — Patient Instructions (Signed)
Medication Instructions:   Your physician recommends that you continue on your current medications as directed. Please refer to the Current Medication list given to you today.  *If you need a refill on your cardiac medications before your next appointment, please call your pharmacy*  Testing/Procedures:  Your physician has requested that you have a lexiscan myoview. For further information please visit [email or web address]. Please follow instruction sheet, as given.  DO ON D-SPECT PER DR. Meda Coffee  Follow-Up: At [facility], you and your health needs are our priority.  As part of our continuing mission to provide you with exceptional heart care, we have created designated Provider Care Teams.  These Care Teams include your primary Cardiologist (physician) and Advanced Practice Providers (APPs -  Physician Assistants and Nurse Practitioners) who all work together to provide you with the care you need, when you need it.  We recommend signing up for the patient portal called "MyChart".  Sign up information is provided on this After Visit Summary.  MyChart is used to connect with patients for Virtual Visits (Telemedicine).  Patients are able to view lab/test results, encounter notes, upcoming appointments, etc.  Non-urgent messages can be sent to your provider as well.   To learn more about what you can do with MyChart, go to [email or web address].    Your next appointment:   6 month(s)  The format for your next appointment:   In Person  Provider:   Ena Dawley, MD

## 2019-12-25 ENCOUNTER — Telehealth (HOSPITAL_COMMUNITY): Payer: Self-pay

## 2019-12-25 NOTE — Telephone Encounter (Signed)
Detailed instructions left on the patient's answering machine. Asked to call back with any questions. S.Braylon Lemmons EMTP 

## 2019-12-26 ENCOUNTER — Ambulatory Visit (HOSPITAL_COMMUNITY): Payer: Medicare Other | Attending: Cardiology

## 2019-12-26 ENCOUNTER — Other Ambulatory Visit: Payer: Self-pay

## 2019-12-26 DIAGNOSIS — E782 Mixed hyperlipidemia: Secondary | ICD-10-CM | POA: Diagnosis not present

## 2019-12-26 DIAGNOSIS — R0600 Dyspnea, unspecified: Secondary | ICD-10-CM | POA: Diagnosis not present

## 2019-12-26 DIAGNOSIS — I2510 Atherosclerotic heart disease of native coronary artery without angina pectoris: Secondary | ICD-10-CM

## 2019-12-26 DIAGNOSIS — I10 Essential (primary) hypertension: Secondary | ICD-10-CM

## 2019-12-26 DIAGNOSIS — R0609 Other forms of dyspnea: Secondary | ICD-10-CM

## 2019-12-26 LAB — MYOCARDIAL PERFUSION IMAGING
LV dias vol: 52 mL (ref 46–106)
LV sys vol: 19 mL
Peak HR: 96 {beats}/min
Rest HR: 75 {beats}/min
SDS: 0
SRS: 2
SSS: 2
TID: 0.96

## 2019-12-26 MED ORDER — REGADENOSON 0.4 MG/5ML IV SOLN
0.4000 mg | Freq: Once | INTRAVENOUS | Status: AC
  Administered 2019-12-26: 0.4 mg via INTRAVENOUS

## 2019-12-26 MED ORDER — TECHNETIUM TC 99M TETROFOSMIN IV KIT
31.5000 | PACK | Freq: Once | INTRAVENOUS | Status: AC | PRN
  Administered 2019-12-26: 31.5 via INTRAVENOUS
  Filled 2019-12-26: qty 32

## 2019-12-26 MED ORDER — TECHNETIUM TC 99M TETROFOSMIN IV KIT
9.8000 | PACK | Freq: Once | INTRAVENOUS | Status: AC | PRN
  Administered 2019-12-26: 9.8 via INTRAVENOUS
  Filled 2019-12-26: qty 10

## 2019-12-28 ENCOUNTER — Other Ambulatory Visit: Payer: Self-pay | Admitting: Cardiology

## 2020-01-07 ENCOUNTER — Other Ambulatory Visit: Payer: Self-pay | Admitting: Cardiology

## 2020-01-07 DIAGNOSIS — I10 Essential (primary) hypertension: Secondary | ICD-10-CM

## 2020-01-08 ENCOUNTER — Other Ambulatory Visit: Payer: Self-pay | Admitting: Cardiology

## 2020-01-08 ENCOUNTER — Other Ambulatory Visit: Payer: Self-pay | Admitting: Internal Medicine

## 2020-01-10 ENCOUNTER — Encounter: Payer: Medicare Other | Admitting: Registered Nurse

## 2020-01-13 ENCOUNTER — Ambulatory Visit: Payer: Medicare Other | Admitting: Internal Medicine

## 2020-01-16 ENCOUNTER — Encounter: Payer: Self-pay | Admitting: Internal Medicine

## 2020-01-16 DIAGNOSIS — Z1231 Encounter for screening mammogram for malignant neoplasm of breast: Secondary | ICD-10-CM | POA: Diagnosis not present

## 2020-01-16 DIAGNOSIS — M8589 Other specified disorders of bone density and structure, multiple sites: Secondary | ICD-10-CM | POA: Diagnosis not present

## 2020-01-16 LAB — HM MAMMOGRAPHY

## 2020-01-21 ENCOUNTER — Encounter: Payer: Self-pay | Admitting: Internal Medicine

## 2020-01-25 ENCOUNTER — Encounter: Payer: Self-pay | Admitting: Internal Medicine

## 2020-02-04 ENCOUNTER — Telehealth: Payer: Medicare Other

## 2020-02-04 NOTE — Chronic Care Management (AMB) (Deleted)
Chronic Care Management Pharmacy  Name: CLEOLA PERRYMAN  MRN: [id number] DOB: 1950/12/22  Chief Complaint/ HPI  Danalee J Ragsdale,  69 y.o. , female presents for their Follow-Up CCM visit with the clinical pharmacist via telephone due to COVID-19 Pandemic  PCP : Binnie Rail, MD  Their chronic conditions include: HTN, CAD (MI x 2 1999), HLD, asthma, migraine, depression, hypothyroidism, osteoporosis, osteoarthritis, Hx breast cancer, prediabetes  Pt has lived in same place since Jun 25, 1971. Her husband passed away 6 years ago. She has two sons, one lives 1/2 mile away, other lives in [place]. New granddaughter Ava Shirlee [place]imer[place].  Office Visits: 12/13/19 Dr Quay Burow OV: depression is still a problem, seeing her granddaughter helps, deferred psych referral. Ordered DEXA, Advised calcium- 500 mg BID and Vitamin D 1000-2000 IU. Decreased benazepril to 20 mg.  09/11/19 Dr Quay Burow OV: increased duloxetine did not help; taper off and start mirtazapine for depression and insomnia. BP low, decrease benazepril to 20 mg.  07/10/19 Dr Quay Burow OV: started HCTZ  3 weeks prior. BP better controlled, pt held BP med one day due to low BP. BMP WNL.  06/17/19 Dr Quay Burow OV: BP 194/100, started HCTZ. Return 3 wks for BMP.  Consult Visit: 12/19/19 Dr Meda Coffee (cardiology): worsening DOE. Repeat nuclear stress test. Orthostasis resolved with reduced dose of benazepril.  10/11/19 NP Marcello Moores (phys med/pain mgmt): UDS appropriate. Refilled hydrocodone.  08/09/19 NP Marcello Moores (phys med): s/p lumbar fusion, refilled hydrocodone. Has office visit every month for opioid monitoring.  02/18/18 Dr Meda Coffee (cardiology): worsening sx of DOE, ordered coronary CTA. Increased metoprolol to 75 mg BID  Allergies  Allergen Reactions  . Fluticasone-Salmeterol Other (See Comments)    REACTION: headache.  She is tolerating Dulera well.  [name] Norvasc [Amlodipine Besylate] Swelling and Other (See Comments)    edema  . Tape Other (See Comments)    Prefers paper  tape    Medications: Outpatient Encounter Medications as of 02/04/2020  Medication Sig  . albuterol (PROVENTIL HFA;VENTOLIN HFA) 108 (90 Base) MCG/ACT inhaler Inhale 2 puffs into the lungs every 4 (four) hours as needed for wheezing or shortness of breath.  [name] atorvastatin (LIPITOR) 20 MG tablet Take 1 tablet (20 mg total) by mouth daily.  . benazepril (LOTENSIN) 20 MG tablet Take 1 tablet (20 mg total) by mouth daily.  [name] BREO ELLIPTA 100-25 MCG/INH AEPB INHALE 1 PUFF INTO THE LUNGS DAILY  . DULoxetine (CYMBALTA) 60 MG capsule Take 1 capsule (60 mg total) by mouth daily. Do NOT chew, crush, or open capsule.... Annual appt due in Sept must see provider for future refills  . gabapentin (NEURONTIN) 600 MG tablet TAKE ONE TABLET EVERY DAY AT BEDTIME  . hydrochlorothiazide (HYDRODIURIL) 25 MG tablet Take 1 tablet (25 mg total) by mouth daily.  [name] HYDROcodone-acetaminophen (NORCO) 10-325 MG tablet Take 1 tablet by mouth every 8 (eight) hours as needed.  [name] ipratropium-albuterol (DUONEB) 0.5-2.5 (3) MG/3ML SOLN Take 3 mLs by nebulization every 4 (four) hours as needed (wheezing or SOB). During exacerbation  . levothyroxine (SYNTHROID) 75 MCG tablet TAKE 1 TABLET BY MOUTH DAILY  . metoprolol tartrate (LOPRESSOR) 50 MG tablet TAKE 2 TABLETS BY MOUTH TWICE DAILY   No facility-administered encounter medications on file as of 02/04/2020.   Wt Readings from Last 3 Encounters:  12/26/19 166 lb (75.3 kg)  12/19/19 166 lb (75.3 kg)  12/13/19 167 lb (75.8 kg)    Current Diagnosis/Assessment:    Goals Addressed   None  Hypertension   BP goal is:  <130/80  Office blood pressures are  BP Readings from Last 3 Encounters:  12/19/19 124/74  12/13/19 110/68  12/06/19 117/78   Patient checks BP at home {CHL HP BP Monitoring [phone]} Patient home BP readings are ranging: ***  Patient has failed these meds in the past: amlodipine (edema) Patient is currently controlled on the  following medications:   Benazepril 20 mg daily  HCTZ 25 mg daily  Metoprolol tartrate 100 mg (50 mg x 2) BID  We discussed: last visit decreased HCTZ to 12.5 mg to occassional low BP and PCP reduced benazepril to 20 mg in interim; today pt reports BP is much better controlled, no longer having low BP or associated dizziness.  Plan  Continue {CHL HP Upstream Pharmacy Plans:[phone]}   Hyperlipidemia / CAD   LDL goal < 70 CAD, Hx of MI x 2, last in 1999, no stents  Lipid Panel     Component Value Date/Time   CHOL 132 12/13/2019 1430   TRIG 106 12/13/2019 1430   HDL 52 12/13/2019 1430   LDLCALC 61 12/13/2019 1430   LDLDIRECT 154.8 02/20/2009 1116    Hepatic Function Latest Ref Rng & Units 12/13/2019 09/11/2019 07/10/2019  Total Protein 6.1 - 8.1 g/dL 5.8(L) 6.4 6.3  Albumin 3.5 - 5.2 g/dL - 4.2 3.8  AST 10 - 35 U/L _0 ALT 6 - 29 U/L _1 Alk Phosphatase 39 - 117 U/L - 89 110  Total Bilirubin 0.2 - 1.2 mg/dL 0.7 0.7 0.6  Bilirubin, Direct 0.0 - 0.3 mg/dL - - -     The 10-year ASCVD risk score Mikey Bussing DC Jr., et al., 2013) is: 10.6%   Values used to calculate the score:     Age: 89 years     Sex: Female     Is Non-Hispanic African American: No     Diabetic: No     Tobacco smoker: No     Systolic Blood Pressure: 349 mmHg     Is BP treated: Yes     HDL Cholesterol: 52 mg/dL     Total Cholesterol: 132 mg/dL   Patient has failed these meds in past: *** Patient is currently {CHL Controlled/[phone]} on the following medications:  . Atorvastatin 20 mg daily  We discussed:  {CHL HP Upstream Pharmacy [phone]}  Plan  Continue {CHL HP Upstream Pharmacy Plans:[phone]}   Asthma   Last spirometry score: ***  Lab Results  Component Value Date/Time   EOSPCT 9.6 12/13/2019 02:30 PM   EOSPCT 8.8 (H) 03/24/2009 09:41 AM   EOSABS 461 12/13/2019 02:30 PM   EOSABS 0.5 03/24/2009 09:41 AM   Patient has failed these meds in past:  Qvar, Dulera Patient is currently controlled on the following medications:   Breo Ellipta 100-25 mcg 1 puff daily  Albuterol HFA prn - does not have one currently  Duoneb 3 mL q4h PRN during exacerbation  Using maintenance inhaler regularly? 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 Cost of [employer] is reasonable once deductible is met.   Plan  Continue current medications  Depression   Depression screen [facility] 2/9 12/06/2019 11/08/2019 10/11/2019  Decreased Interest _2 Down, Depressed, Hopeless _3 PHQ - 2 Score _4 Altered sleeping - - 3  Tired, decreased energy - - 3  Change in appetite - - 3  Feeling bad or failure about yourself  - - 0  Trouble concentrating - - 2  Moving slowly or fidgety/restless - - 0  Suicidal thoughts - - 0  PHQ-9 Score - - 17  Difficult doing work/chores - - -  Some recent data might be hidden   Patient has failed these meds in past: citalopram, sertraline, duloxetine Patient is currently uncontrolled on the following medications:   Duloxetine 60 mg daily  We discussed:  Pt recently switched from duloxetine to mirtazapine due to inadequate benefit with duloxetine, however she experienced oversedation and "fogginess" in the AM even after trial of higher 30 mg dose. Pt stopped mirtazapine and restarted previously-tolerated dose of duloxetine over the weekend.  Plan  Continue current medications  Osteoarthritis/Pain   Patient has failed these meds in past: duloxetine Patient is currently controlled on the following medications:   Hydrocodone-APAP 10-325 mg -1 tablet q8h prn  Gabapentin 600 mg HS  We discussed:  Pt had reduced gabapentin to 1/2 tablet due to sedation issues with mirtazapine, however now that she has returned to duloxetine she is taking full dose of gabapentin again. Denies issues.   Plan  Continue  current medications  Osteopenia    Last DEXA Scan: 11/2019 -osteopenia, elevated hip fracture risk  Lab Results  Component Value Date/Time   VD25OH 18 (L) 12/13/2019 02:30 PM   VD25OH 21.44 (L) 07/12/2017 03:56 PM   Patient is a candidate for pharmacologic treatment due to T-Score -1.0 to -2.5 and 10-year risk of hip fracture > 3%  Patient has failed these meds in past: n/a Patient is currently {CHL Controlled/[phone]} on the following medications:  . Calcium 600 mg BID . Vitamin D 2000 IU daily  We discussed:  {Osteoporosis Counseling:[phone]}  Plan  Continue {CHL HP Upstream Pharmacy IOMBT:[id number]}   Follow up: *** month phone visit  Charlene Brooke, PharmD, BCACP Clinical Pharmacist [facility] [phone]

## 2020-02-07 ENCOUNTER — Other Ambulatory Visit: Payer: Self-pay

## 2020-02-07 ENCOUNTER — Encounter: Payer: Self-pay | Admitting: Registered Nurse

## 2020-02-07 ENCOUNTER — Encounter: Payer: Medicare Other | Attending: Physical Medicine & Rehabilitation | Admitting: Registered Nurse

## 2020-02-07 VITALS — BP 120/73 | HR 71 | Temp 97.8°F | Ht 60.0 in | Wt 166.0 lb

## 2020-02-07 DIAGNOSIS — M961 Postlaminectomy syndrome, not elsewhere classified: Secondary | ICD-10-CM | POA: Diagnosis not present

## 2020-02-07 DIAGNOSIS — M48062 Spinal stenosis, lumbar region with neurogenic claudication: Secondary | ICD-10-CM | POA: Diagnosis not present

## 2020-02-07 DIAGNOSIS — Z79891 Long term (current) use of opiate analgesic: Secondary | ICD-10-CM | POA: Diagnosis not present

## 2020-02-07 DIAGNOSIS — M24551 Contracture, right hip: Secondary | ICD-10-CM | POA: Diagnosis not present

## 2020-02-07 DIAGNOSIS — Z5181 Encounter for therapeutic drug level monitoring: Secondary | ICD-10-CM | POA: Insufficient documentation

## 2020-02-07 DIAGNOSIS — G894 Chronic pain syndrome: Secondary | ICD-10-CM | POA: Diagnosis not present

## 2020-02-07 MED ORDER — HYDROCODONE-ACETAMINOPHEN 10-325 MG PO TABS: 1.0000 | ORAL_TABLET | Freq: Three times a day (TID) | ORAL | 0 refills | Status: DC | PRN

## 2020-02-07 NOTE — Progress Notes (Signed)
Subjective:    Patient ID: Hannah Scott, female    DOB: 11/25/1950, 69 y.o.   MRN: [id number]  HPI: Hannah Scott is a 69 y.o. female who returns for follow up appointment for chronic pain and medication refill. She states her  pain is located in her lower back pain. She rates her pain 7.Her current exercise regime is walking and performing stretching exercises.  [name] states she is having financial hardship, we will allow her to return in two months, the above will be discussed with Dr Letta Pate. She verbalizes understanding.   Ms. Linhart Morphine equivalent is 30.30   MME.    Last Oral Swab was Performed on 10/11/2019, it was consistent.    Pain Inventory Average Pain 7 Pain Right Now 7 My pain is constant, burning, tingling and aching  In the last 24 hours, has pain interfered with the following? General activity 10 Relation with others 10 Enjoyment of life 10 What TIME of day is your pain at its worst? morning , daytime, evening and night Sleep (in general) Poor  Pain is worse with: walking and standing Pain improves with: rest and medication Relief from Meds: 5  Family History  Problem Relation Age of Onset  . Heart failure Mother   . Pneumonia Mother   . Hypertension Mother   . Heart disease Mother   . Stroke Maternal Grandfather   . Hypertension Maternal Grandfather   . Heart attack Father   . Heart disease Father   . Asthma Paternal Uncle        PAT UNCLES  . Heart attack Paternal Uncle    Social History   Socioeconomic History  . Marital status: Widowed    Spouse name: Not on file  . Number of children: Not on file  . Years of education: Not on file  . Highest education level: Not on file  Occupational History  . Not on file  Tobacco Use  . Smoking status: Never Smoker  . Smokeless tobacco: Never Used  Substance and Sexual Activity  . Alcohol use: No    Alcohol/week: 0.0 standard drinks  . Drug use: No  . Sexual activity: Never    Birth  control/protection: Surgical  Other Topics Concern  . Not on file  Social History Narrative  . Not on file   Social Determinants of Health   Financial Resource Strain: Low Risk   . Difficulty of Paying Living Expenses: Not very hard  Food Insecurity:   . Worried About Charity fundraiser in the Last Year: Not on file  . Ran Out of Food in the Last Year: Not on file  Transportation Needs: No Transportation Needs  . Lack of Transportation (Medical): No  . Lack of Transportation (Non-Medical): No  Physical Activity:   . Days of Exercise per Week: Not on file  . Minutes of Exercise per Session: Not on file  Stress:   . Feeling of Stress : Not on file  Social Connections:   . Frequency of Communication with Friends and Family: Not on file  . Frequency of Social Gatherings with Friends and Family: Not on file  . Attends Religious Services: Not on file  . Active Member of Clubs or Organizations: Not on file  . Attends Archivist Meetings: Not on file  . Marital Status: Not on file   Past Surgical History:  Procedure Laterality Date  . ANKLE SURGERY Left    ligament  . APPENDECTOMY  1987  AT [facility]  . BACK SURGERY     Fusion  . BREAST LUMPECTOMY  2003   radiation on right  . BREAST LUMPECTOMY WITH RADIOACTIVE SEED LOCALIZATION Left 02/12/2016   Procedure: LEFT BREAST LUMPECTOMY WITH RADIOACTIVE SEED LOCALIZATION;  Surgeon: Excell Seltzer, MD;  Location: [facility];  Service: General;  Laterality: Left;  . CARDIOVASCULAR STRESS TEST  09/07/05   Nuclear, was negative  . CATARACT EXTRACTION Bilateral 01,03  . OOPHORECTOMY     LSO -RSO  . PELVIC LAPAROSCOPY  1989   RSO, LYSIS OF ADHESIONS  . TOTAL ABDOMINAL HYSTERECTOMY  1987   LSO, APPENDECTOMY  . TOTAL HIP ARTHROPLASTY Right 10/28/2013   dr Lorin Mercy  . TOTAL HIP ARTHROPLASTY Right 10/28/2013   Procedure: TOTAL HIP ARTHROPLASTY ANTERIOR APPROACH;  Surgeon: Marybelle Killings, MD;  Location: [facility];  Service:  Orthopedics;  Laterality: Right;  Right Total Hip Arthroplasty, Direct Anterior Approach   Past Surgical History:  Procedure Laterality Date  . ANKLE SURGERY Left    ligament  . APPENDECTOMY  1987   AT [facility]  . BACK SURGERY     Fusion  . BREAST LUMPECTOMY  2003   radiation on right  . BREAST LUMPECTOMY WITH RADIOACTIVE SEED LOCALIZATION Left 02/12/2016   Procedure: LEFT BREAST LUMPECTOMY WITH RADIOACTIVE SEED LOCALIZATION;  Surgeon: Excell Seltzer, MD;  Location: [facility];  Service: General;  Laterality: Left;  . CARDIOVASCULAR STRESS TEST  09/07/05   Nuclear, was negative  . CATARACT EXTRACTION Bilateral 01,03  . OOPHORECTOMY     LSO -RSO  . PELVIC LAPAROSCOPY  1989   RSO, LYSIS OF ADHESIONS  . TOTAL ABDOMINAL HYSTERECTOMY  1987   LSO, APPENDECTOMY  . TOTAL HIP ARTHROPLASTY Right 10/28/2013   dr Lorin Mercy  . TOTAL HIP ARTHROPLASTY Right 10/28/2013   Procedure: TOTAL HIP ARTHROPLASTY ANTERIOR APPROACH;  Surgeon: Marybelle Killings, MD;  Location: [facility];  Service: Orthopedics;  Laterality: Right;  Right Total Hip Arthroplasty, Direct Anterior Approach   Past Medical History:  Diagnosis Date  . Anemia    hx  . Arthritis   . Asthma    PFTs, February, 2011, moderate obstructive disease with response to bronchodilators, normal lung volumes, moderate reduction in diffusing capacity  . Atrial septal aneurysm    Echo, 2008-not noted on 13 echo  . CAD (coronary artery disease)    90% distal LAD in the past  /   nuclear, 2008, no ischemia, ejection fraction 70%  . Cancer ([place]) 2002   DUCTAL CIS--S/P LUMPECTOMY, RADIATION AND 6 WEEKS OF TAMOXIFEN  . D-dimer, elevated    January, 2014  . Depression   . Ejection fraction    EF 60%, echo, October, 2008  . Elevated CPK    January, 2014  . Endometriosis 1989   RIGHT TUBE  . Endometriosis 1987   LEFT TUBE/OVARY W FOCAL IN-SITU ENDOMETRIAL ADENOCARCINOMA  . GERD (gastroesophageal reflux disease)    occ  . H/O hiatal hernia     ?  [name] Hyperlipidemia   . Hypertension   . Hypothyroidism    Patient has had in the past that she does not need treatment  . Kyphoscoliosis   . Obstructive airway disease ([place])   . Pinched nerve    lower back  . Shortness of breath   . UTI (lower urinary tract infection)    BP 120/73   Pulse 71   Temp 97.8 F (36.6 C)   Ht 5' (1.524 m)  Wt 166 lb (75.3 kg)   SpO2 91%   BMI 32.42 kg/m   Opioid Risk Score:   Fall Risk Score:  `1  Depression screen PHQ 2/9  Depression screen [facility] 2/9 12/06/2019 11/08/2019 10/11/2019 09/11/2019 09/11/2018 05/16/2018 01/17/2018  Decreased Interest 3 3 3  0 2 1 1   Down, Depressed, Hopeless 2 2 3 1 2 1 1   PHQ - 2 Score 5 5 6 1 4 2 2   Altered sleeping - - 3 - - - -  Tired, decreased energy - - 3 - - - -  Change in appetite - - 3 - - - -  Feeling bad or failure about yourself  - - 0 - - - -  Trouble concentrating - - 2 - - - -  Moving slowly or fidgety/restless - - 0 - - - -  Suicidal thoughts - - 0 - - - -  PHQ-9 Score - - 17 - - - -  Difficult doing work/chores - - - - - - -  Some recent data might be hidden   Review of Systems  Constitutional: Negative.   HENT: Negative.   Eyes: Negative.   Respiratory: Negative.   Cardiovascular: Negative.   Gastrointestinal: Negative.   Endocrine: Negative.   Genitourinary: Negative.   Musculoskeletal: Positive for arthralgias, back pain and gait problem.  Skin: Negative.   Allergic/Immunologic: Negative.   Hematological: Negative.   Psychiatric/Behavioral: Positive for dysphoric mood.  All other systems reviewed and are negative.      Objective:   Physical Exam Vitals and nursing note reviewed.  Constitutional:      Appearance: Normal appearance.  Cardiovascular:     Rate and Rhythm: Normal rate and regular rhythm.     Pulses: Normal pulses.     Heart sounds: Normal heart sounds.  Pulmonary:     Effort: Pulmonary effort is normal.     Breath sounds: Normal breath sounds.  Musculoskeletal:      Cervical back: Normal range of motion and neck supple.     Comments: Normal Muscle Bulk and Muscle Testing Reveals:  Upper Extremities: Full ROM and Muscle Strength 5/5  Lumbar Paraspinal Tenderness: L-4-L-5 Lower Extremities: Full ROM and Muscle Strength 5/5 Arises from Table Slowly using walker for support Narrow Based Gait   Skin:    General: Skin is warm and dry.  Neurological:     Mental Status: She is alert and oriented to person, place, and time.  Psychiatric:        Mood and Affect: Mood normal.        Behavior: Behavior normal.           Assessment & Plan:  1.Lumbar post laminectomy syndromewith severe kyphoscoliosis thoracolumbar spine, s/p lumbar fusion/ 02/07/2020 Continue current medication regimen.Refilled: Hydrocodone 10/325mg  one tablet every8hours may take an extra tablet when pain is severe#100. We will continue the opioid monitoring program, this consists of regular clinic visits, examinations, urine drug screen, pill counts as well as use of [place] Controlled Substance Reporting system. A 12 month History has been reviewed on the [place] Controlled Substance Reporting System on 02/07/2020 2. Right Hip OA: S/P Right Hip Replacement 10/28/2013.02/07/2020 3.Depression: Continue Cymbalta. Has Family Support and Friends. Counseling with Doristine Bosworth.02/07/2020. 4.RightGreater Trochanteric Tenderness:No complaints today.Continue to Monitor. Continue with Ice and Heat Therapy.02/07/2020  F/U in 1 month  20  minutes of face to face patient care time was spent during this visit. All questions were encouraged and answered.

## 2020-03-04 ENCOUNTER — Telehealth: Payer: Self-pay | Admitting: Registered Nurse

## 2020-03-04 MED ORDER — HYDROCODONE-ACETAMINOPHEN 10-325 MG PO TABS: 1.0000 | ORAL_TABLET | Freq: Three times a day (TID) | ORAL | 0 refills | Status: DC | PRN

## 2020-03-04 NOTE — Telephone Encounter (Signed)
PMP was Reviewed. Hydrocodone e-scribed today. Hannah Scott has a scheduled appointment in December due to financial hardship.

## 2020-03-10 ENCOUNTER — Other Ambulatory Visit: Payer: Self-pay | Admitting: Cardiology

## 2020-03-27 ENCOUNTER — Other Ambulatory Visit: Payer: Self-pay | Admitting: Internal Medicine

## 2020-03-27 MED ORDER — LEVOTHYROXINE SODIUM 75 MCG PO TABS: 75.0000 ug | ORAL_TABLET | Freq: Every day | ORAL | 2 refills | Status: DC

## 2020-04-02 ENCOUNTER — Telehealth: Payer: Self-pay | Admitting: Pharmacist

## 2020-04-02 NOTE — Progress Notes (Signed)
Chronic Care Management Pharmacy Assistant   Name: SANDER SPECKMAN  MRN: [id number] DOB: Apr 11, 1951  Reason for Encounter: General Adherence Call   PCP : Binnie Rail, MD  Allergies:   Allergies  Allergen Reactions  . Fluticasone-Salmeterol Other (See Comments)    REACTION: headache.  She is tolerating Dulera well.  [name] Norvasc [Amlodipine Besylate] Swelling and Other (See Comments)    edema  . Tape Other (See Comments)    Prefers paper tape    Medications: Outpatient Encounter Medications as of 04/02/2020  Medication Sig  . albuterol (PROVENTIL HFA;VENTOLIN HFA) 108 (90 Base) MCG/ACT inhaler Inhale 2 puffs into the lungs every 4 (four) hours as needed for wheezing or shortness of breath.  [name] atorvastatin (LIPITOR) 20 MG tablet Take 1 tablet (20 mg total) by mouth daily.  . benazepril (LOTENSIN) 20 MG tablet Take 1 tablet (20 mg total) by mouth daily.  [name] BREO ELLIPTA 100-25 MCG/INH AEPB INHALE 1 PUFF INTO THE LUNGS DAILY  . DULoxetine (CYMBALTA) 60 MG capsule Take 1 capsule (60 mg total) by mouth daily. Do NOT chew, crush, or open capsule.... Annual appt due in Sept must see provider for future refills  . gabapentin (NEURONTIN) 600 MG tablet TAKE ONE TABLET EVERY DAY AT BEDTIME  . hydrochlorothiazide (HYDRODIURIL) 25 MG tablet Take 1 tablet (25 mg total) by mouth daily.  [name] HYDROcodone-acetaminophen (NORCO) 10-325 MG tablet Take 1 tablet by mouth every 8 (eight) hours as needed. Do Not Fill Before 03/10/2020  . ipratropium-albuterol (DUONEB) 0.5-2.5 (3) MG/3ML SOLN Take 3 mLs by nebulization every 4 (four) hours as needed (wheezing or SOB). During exacerbation  . levothyroxine (SYNTHROID) 75 MCG tablet Take 1 tablet (75 mcg total) by mouth daily.  . metoprolol tartrate (LOPRESSOR) 50 MG tablet TAKE 2 TABLETS BY MOUTH TWICE DAILY   No facility-administered encounter medications on file as of 04/02/2020.    Current Diagnosis: Patient Active Problem List   Diagnosis Date Noted  .  Insomnia 09/11/2019  . Murmur, cardiac 01/16/2018  . Moderate persistent asthma with exacerbation 12/13/2017  . Osteoporosis 01/07/2017  . Acute bronchitis 10/17/2016  . Prediabetes 07/07/2016  . History of breast cancer 01/06/2016  . Intraductal papilloma of left breast 01/06/2016  . Essential hypertension 06/08/2015  . DOE (dyspnea on exertion) 06/08/2015  . Fatigue 05/20/2015  . Back pain 04/22/2015  . Anemia due to blood loss 11/27/2013  . Elevated CPK   . Multinodular goiter 12/01/2011  . Asthma   . Hypothyroidism   . CAD (coronary artery disease)   . Atrial septal aneurysm   . Hyperlipidemia   . Scoliosis   . Spinal stenosis, lumbar region, with neurogenic claudication 07/01/2011  . Lumbar postlaminectomy syndrome 07/01/2011  . Osteoarthritis of right hip 07/01/2011  . Contracture of right hip 07/01/2011  . Endometriosis   . Depression 12/24/2009  . Migraine headache 05/19/2006    Goals Addressed   None     Follow-Up:  Pharmacist Review   A general adherence call was made to Ms. Marcos for a wellness call to see how she has been doing since her last visit with the clinical pharmacist Mendel Ryder. The patient stated that she is doing well at this time not having any problems and is back on track with her medications. She stated that she would like for Dr. Quay Burow to continue with her meds. I let the patient know that I would pass along the information with the clinical pharmacist Mendel Ryder.  Wendy Poet,  [place]

## 2020-04-02 NOTE — Telephone Encounter (Signed)
  CCM enrollment status changed to "previously enrolled" as per patient request on 04/02/2020 to discontinue enrollment. Case closed to case management services in primary care home.   Charlton Haws, [facility]

## 2020-04-03 ENCOUNTER — Encounter: Payer: Self-pay | Admitting: Registered Nurse

## 2020-04-03 ENCOUNTER — Other Ambulatory Visit: Payer: Self-pay

## 2020-04-03 ENCOUNTER — Encounter: Payer: Medicare Other | Attending: Physical Medicine & Rehabilitation | Admitting: Registered Nurse

## 2020-04-03 VITALS — BP 148/86 | HR 65 | Temp 97.9°F | Ht 60.0 in | Wt 165.8 lb

## 2020-04-03 DIAGNOSIS — M48062 Spinal stenosis, lumbar region with neurogenic claudication: Secondary | ICD-10-CM | POA: Insufficient documentation

## 2020-04-03 DIAGNOSIS — G894 Chronic pain syndrome: Secondary | ICD-10-CM | POA: Diagnosis not present

## 2020-04-03 DIAGNOSIS — Z79891 Long term (current) use of opiate analgesic: Secondary | ICD-10-CM | POA: Diagnosis not present

## 2020-04-03 DIAGNOSIS — Z5181 Encounter for therapeutic drug level monitoring: Secondary | ICD-10-CM | POA: Diagnosis not present

## 2020-04-03 DIAGNOSIS — M419 Scoliosis, unspecified: Secondary | ICD-10-CM

## 2020-04-03 DIAGNOSIS — M24551 Contracture, right hip: Secondary | ICD-10-CM

## 2020-04-03 DIAGNOSIS — M961 Postlaminectomy syndrome, not elsewhere classified: Secondary | ICD-10-CM | POA: Diagnosis not present

## 2020-04-03 MED ORDER — HYDROCODONE-ACETAMINOPHEN 10-325 MG PO TABS: 1.0000 | ORAL_TABLET | Freq: Three times a day (TID) | ORAL | 0 refills | Status: DC | PRN

## 2020-04-03 NOTE — Progress Notes (Signed)
Subjective:    Patient ID: Hannah Scott, female    DOB: [birth year], 69 y.o.   MRN: [id number]  HPI: Hannah Scott is a 69 y.o. female who returns for follow up appointment for chronic pain and medication refill. She states her pain is located in her lower back. She rates her pain 7. Her current exercise regime is walking and performing stretching exercises.  Hannah Scott Morphine equivalent is  35.56 MME.    Last Oral Swab was Performed on 10/11/2019, it was consistent.   Pain Inventory Average Pain 7 Pain Right Now 7 My pain is constant, burning, tingling and aching  In the last 24 hours, has pain interfered with the following? General activity 10 Relation with others 10 Enjoyment of life 10 What TIME of day is your pain at its worst? morning , daytime, evening and night Sleep (in general) Poor  Pain is worse with: walking and standing Pain improves with: rest and medication Relief from Meds: 5  Family History  Problem Relation Age of Onset  . Heart failure Mother   . Pneumonia Mother   . Hypertension Mother   . Heart disease Mother   . Stroke Maternal Grandfather   . Hypertension Maternal Grandfather   . Heart attack Father   . Heart disease Father   . Asthma Paternal Uncle        PAT UNCLES  . Heart attack Paternal Uncle    Social History   Socioeconomic History  . Marital status: Widowed    Spouse name: Not on file  . Number of children: Not on file  . Years of education: Not on file  . Highest education level: Not on file  Occupational History  . Not on file  Tobacco Use  . Smoking status: Never Smoker  . Smokeless tobacco: Never Used  Substance and Sexual Activity  . Alcohol use: No    Alcohol/week: 0.0 standard drinks  . Drug use: No  . Sexual activity: Never    Birth control/protection: Surgical  Other Topics Concern  . Not on file  Social History Narrative  . Not on file   Social Determinants of Health   Financial Resource Strain: Low Risk   .  Difficulty of Paying Living Expenses: Not very hard  Food Insecurity: Not on file  Transportation Needs: No Transportation Needs  . Lack of Transportation (Medical): No  . Lack of Transportation (Non-Medical): No  Physical Activity: Not on file  Stress: Not on file  Social Connections: Not on file   Past Surgical History:  Procedure Laterality Date  . ANKLE SURGERY Left    ligament  . APPENDECTOMY  1987   AT [facility]  . BACK SURGERY     Fusion  . BREAST LUMPECTOMY  2003   radiation on right  . BREAST LUMPECTOMY WITH RADIOACTIVE SEED LOCALIZATION Left 02/12/2016   Procedure: LEFT BREAST LUMPECTOMY WITH RADIOACTIVE SEED LOCALIZATION;  Surgeon: Excell Seltzer, MD;  Location: [place] [facility];  Service: General;  Laterality: Left;  . CARDIOVASCULAR STRESS TEST  09/07/05   Nuclear, was negative  . CATARACT EXTRACTION Bilateral 01,03  . OOPHORECTOMY     LSO -RSO  . PELVIC LAPAROSCOPY  1989   RSO, LYSIS OF ADHESIONS  . TOTAL ABDOMINAL HYSTERECTOMY  1987   LSO, APPENDECTOMY  . TOTAL HIP ARTHROPLASTY Right 10/28/2013   dr Lorin Mercy  . TOTAL HIP ARTHROPLASTY Right 10/28/2013   Procedure: TOTAL HIP ARTHROPLASTY ANTERIOR APPROACH;  Surgeon: Marybelle Killings, MD;  Location: [facility];  Service: Orthopedics;  Laterality: Right;  Right Total Hip Arthroplasty, Direct Anterior Approach   Past Surgical History:  Procedure Laterality Date  . ANKLE SURGERY Left    ligament  . APPENDECTOMY  1987   AT [facility]  . BACK SURGERY     Fusion  . BREAST LUMPECTOMY  2003   radiation on right  . BREAST LUMPECTOMY WITH RADIOACTIVE SEED LOCALIZATION Left 02/12/2016   Procedure: LEFT BREAST LUMPECTOMY WITH RADIOACTIVE SEED LOCALIZATION;  Surgeon: Excell Seltzer, MD;  Location: [place];  Service: General;  Laterality: Left;  . CARDIOVASCULAR STRESS TEST  09/07/05   Nuclear, was negative  . CATARACT EXTRACTION Bilateral 01,03  . OOPHORECTOMY     LSO -RSO  . PELVIC LAPAROSCOPY  1989   RSO,  LYSIS OF ADHESIONS  . TOTAL ABDOMINAL HYSTERECTOMY  1987   LSO, APPENDECTOMY  . TOTAL HIP ARTHROPLASTY Right 10/28/2013   dr Lorin Mercy  . TOTAL HIP ARTHROPLASTY Right 10/28/2013   Procedure: TOTAL HIP ARTHROPLASTY ANTERIOR APPROACH;  Surgeon: Marybelle Killings, MD;  Location: [place];  Service: Orthopedics;  Laterality: Right;  Right Total Hip Arthroplasty, Direct Anterior Approach   Past Medical History:  Diagnosis Date  . Anemia    hx  . Arthritis   . Asthma    PFTs, February, 2011, moderate obstructive disease with response to bronchodilators, normal lung volumes, moderate reduction in diffusing capacity  . Atrial septal aneurysm    Echo, 2008-not noted on 13 echo  . CAD (coronary artery disease)    90% distal LAD in the past  /   nuclear, 2008, no ischemia, ejection fraction 70%  . Cancer ([place]) 2002   DUCTAL CIS--S/P LUMPECTOMY, RADIATION AND 6 WEEKS OF TAMOXIFEN  . D-dimer, elevated    January, 2014  . Depression   . Ejection fraction    EF 60%, echo, October, 2008  . Elevated CPK    January, 2014  . Endometriosis 1989   RIGHT TUBE  . Endometriosis 1987   LEFT TUBE/OVARY W FOCAL IN-SITU ENDOMETRIAL ADENOCARCINOMA  . GERD (gastroesophageal reflux disease)    occ  . H/O hiatal hernia    ?  [name] Hyperlipidemia   . Hypertension   . Hypothyroidism    Patient has had in the past that she does not need treatment  . Kyphoscoliosis   . Obstructive airway disease ([place])   . Pinched nerve    lower back  . Shortness of breath   . UTI (lower urinary tract infection)    BP (!) 148/86   Pulse 65   Temp 97.9 F (36.6 C)   Ht 5' (1.524 m)   Wt 165 lb 12.8 oz (75.2 kg)   SpO2 92%   BMI 32.38 kg/m   Opioid Risk Score:   Fall Risk Score:  `1  Depression screen PHQ 2/9  Depression screen [facility] 2/9 04/03/2020 12/06/2019 11/08/2019 10/11/2019 09/11/2019 09/11/2018 05/16/2018  Decreased Interest 3 3 3 3  0 2 1  Down, Depressed, Hopeless 3 2 2 3 1 2 1   PHQ - 2 Score 6 5 5 6 1 4 2   Altered sleeping  - - - 3 - - -  Tired, decreased energy - - - 3 - - -  Change in appetite - - - 3 - - -  Feeling bad or failure about yourself  - - - 0 - - -  Trouble concentrating - - - 2 - - -  Moving slowly or fidgety/restless - - -  0 - - -  Suicidal thoughts - - - 0 - - -  PHQ-9 Score - - - 17 - - -  Difficult doing work/chores - - - - - - -  Some recent data might be hidden    Review of Systems  Constitutional: Negative.   HENT: Negative.   Eyes: Negative.   Respiratory: Positive for shortness of breath.        Asthma  Cardiovascular: Negative.   Gastrointestinal: Negative.   Endocrine: Negative.   Genitourinary: Negative.   Musculoskeletal: Positive for back pain and gait problem.  Skin: Negative.   Allergic/Immunologic: Negative.   Neurological: Positive for weakness and numbness.       Tingling  Hematological: Negative.   Psychiatric/Behavioral: Positive for dysphoric mood.  All other systems reviewed and are negative.      Objective:   Physical Exam Vitals and nursing note reviewed.  Constitutional:      Appearance: Normal appearance.  Cardiovascular:     Rate and Rhythm: Normal rate and regular rhythm.     Pulses: Normal pulses.     Heart sounds: Normal heart sounds.  Pulmonary:     Effort: Pulmonary effort is normal.     Breath sounds: Normal breath sounds.  Musculoskeletal:     Cervical back: Normal range of motion and neck supple.     Comments: Normal Muscle Bulk and Muscle Testing Reveals:  Upper Extremities: Full ROM and Muscle Strength 5/5 Lumbar Paraspinal Tenderness: L-3-L-5 Lower Extremities: Full ROM and Muscle Strength 5/5  Arises from Table slowly using walker for support Antalgic Gait   Skin:    General: Skin is warm and dry.  Neurological:     Mental Status: She is alert and oriented to person, place, and time.  Psychiatric:        Mood and Affect: Mood normal.        Behavior: Behavior normal.           Assessment & Plan:  1.Lumbar post  laminectomy syndromewith severe kyphoscoliosis thoracolumbar spine, s/p lumbar fusion/ 04/03/2020 Continue current medication regimen.Refilled: Hydrocodone 10/325mg  one tablet every8hours may take an extra tablet when pain is severe#100. We will continue the opioid monitoring program, this consists of regular clinic visits, examinations, urine drug screen, pill counts as well as use of [place] Controlled Substance Reporting system. A 12 month History has been reviewed on the [place] Controlled Substance Reporting System on 04/03/2020 2. Right Hip OA: S/P Right Hip Replacement 10/28/2013.04/03/2020 3.Depression: Continue Cymbalta. Has Family Support and Friends. Counseling with Doristine Bosworth.04/03/2020. 4.RightGreater Trochanteric Tenderness:No complaints today.Continue to Monitor. Continue with Ice and Heat Therapy.04/03/2020  F/U in 1 month

## 2020-04-09 ENCOUNTER — Other Ambulatory Visit: Payer: Self-pay | Admitting: Internal Medicine

## 2020-04-12 ENCOUNTER — Other Ambulatory Visit: Payer: Self-pay | Admitting: Internal Medicine

## 2020-05-04 ENCOUNTER — Other Ambulatory Visit: Payer: Self-pay | Admitting: Internal Medicine

## 2020-05-12 ENCOUNTER — Other Ambulatory Visit: Payer: Self-pay

## 2020-05-12 ENCOUNTER — Encounter: Payer: Self-pay | Admitting: Registered Nurse

## 2020-05-12 ENCOUNTER — Encounter: Payer: Medicare Other | Attending: Physical Medicine & Rehabilitation | Admitting: Registered Nurse

## 2020-05-12 VITALS — BP 109/64 | HR 67 | Ht 60.0 in | Wt 162.0 lb

## 2020-05-12 DIAGNOSIS — M24551 Contracture, right hip: Secondary | ICD-10-CM | POA: Insufficient documentation

## 2020-05-12 DIAGNOSIS — G894 Chronic pain syndrome: Secondary | ICD-10-CM | POA: Insufficient documentation

## 2020-05-12 DIAGNOSIS — M961 Postlaminectomy syndrome, not elsewhere classified: Secondary | ICD-10-CM | POA: Insufficient documentation

## 2020-05-12 DIAGNOSIS — M48062 Spinal stenosis, lumbar region with neurogenic claudication: Secondary | ICD-10-CM | POA: Insufficient documentation

## 2020-05-12 DIAGNOSIS — Z5181 Encounter for therapeutic drug level monitoring: Secondary | ICD-10-CM

## 2020-05-12 DIAGNOSIS — Z79891 Long term (current) use of opiate analgesic: Secondary | ICD-10-CM | POA: Insufficient documentation

## 2020-05-12 MED ORDER — GABAPENTIN 600 MG PO TABS: ORAL_TABLET | ORAL | 5 refills | Status: DC

## 2020-05-12 MED ORDER — HYDROCODONE-ACETAMINOPHEN 10-325 MG PO TABS: 1.0000 | ORAL_TABLET | Freq: Three times a day (TID) | ORAL | 0 refills | Status: DC | PRN
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
## 2020-06-02 ENCOUNTER — Other Ambulatory Visit: Payer: Self-pay

## 2020-06-02 ENCOUNTER — Other Ambulatory Visit: Payer: Self-pay | Admitting: Registered Nurse

## 2020-06-02 ENCOUNTER — Ambulatory Visit: Payer: Medicare Other | Admitting: Cardiology

## 2020-06-04 NOTE — Progress Notes (Signed)
Cardiology Office Note:    Date:  06/05/2020   ID:  Hannah Scott, DOB 28-May-1950, MRN [id number]  PCP:  Binnie Rail, MD   [place]  Cardiologist:  Ena Dawley, MD   Advanced Practice Provider:  No care team member to display Electrophysiologist:  None       Referring MD: Binnie Rail, MD   Chief Complaint:  Shortness of Breath    Patient Profile:    Hannah Scott is a 70 y.o. female with:   Coronary artery disease   S/p MI in 1991, 1998  Hx of vasospasm   Cath 1998: dLAD 90  [place] in 2014: mild ischemia >> Med Rx   Myoview in 2017: low risk   Coronary CTA 12/19: unsuccessful  Myoview in 9/21: low risk   Asthma   Breast CA  GERD  Hypertension   Hyperlipidemia   Hypothyroidism   Scoliosis   Prior CV studies: Myoview 12/26/19 EF 64, no infarct or ischemia, low risk  Echocardiogram 02/02/18 Mod LVH, EF 55-60, no RWMA, Gr 1 DD, trivial AI, mild dilated asc Ao  History of Present Illness:    [name] was last seen by Dr. Meda Coffee in 8/21.  She returns for f/u.  She is here alone.  She notes shortness of breath with minimal activity in the past few months.  She usually is short of breath with her asthma.  However, this is different.  She is not wheezing.  She has not had any fevers or cough.  She has not had chest discomfort.  Her previous anginal equivalent was back pain.  She has not had any back pain.  However, she has noted some tightness in her neck.  This has happened only a few times and was associated with her shortness of breath.  She has not had PND, leg edema, weight increase or syncope.  Her blood pressure has run low at times and she has been lightheaded with this.  She sleeps in a recliner because of her back problems.  She is unable to lay flat.      Past Medical History:  Diagnosis Date  . Anemia    hx  . Arthritis   . Asthma    PFTs, February, 2011, moderate obstructive disease with response to  bronchodilators, normal lung volumes, moderate reduction in diffusing capacity  . Atrial septal aneurysm    Echo, 2008-not noted on 13 echo  . CAD (coronary artery disease)    90% distal LAD in the past  /   nuclear, 2008, no ischemia, ejection fraction 70%  . Cancer ([place]) 2002   DUCTAL CIS--S/P LUMPECTOMY, RADIATION AND 6 WEEKS OF TAMOXIFEN  . D-dimer, elevated    January, 2014  . Depression   . Ejection fraction    EF 60%, echo, October, 2008  . Elevated CPK    January, 2014  . Endometriosis 1989   RIGHT TUBE  . Endometriosis 1987   LEFT TUBE/OVARY W FOCAL IN-SITU ENDOMETRIAL ADENOCARCINOMA  . GERD (gastroesophageal reflux disease)    occ  . H/O hiatal hernia    ?  [name] Hyperlipidemia   . Hypertension   . Hypothyroidism    Patient has had in the past that she does not need treatment  . Kyphoscoliosis   . Obstructive airway disease ([place])   . Pinched nerve    lower back  . Shortness of breath   . UTI (lower urinary tract infection)  Current Medications: Current Meds  Medication Sig  . albuterol (PROVENTIL HFA;VENTOLIN HFA) 108 (90 Base) MCG/ACT inhaler Inhale 2 puffs into the lungs every 4 (four) hours as needed for wheezing or shortness of breath.  [name] amLODipine (NORVASC) 5 MG tablet Take 0.5 tablets (2.5 mg total) by mouth daily.  [name] aspirin EC 81 MG tablet Take 1 tablet (81 mg total) by mouth daily. Swallow whole.  [name] atorvastatin (LIPITOR) 20 MG tablet Take 1 tablet (20 mg total) by mouth daily.  . benazepril (LOTENSIN) 20 MG tablet Take 1 tablet (20 mg total) by mouth daily.  [name] BREO ELLIPTA 100-25 MCG/INH AEPB INHALE 1 PUFF INTO THE LUNGS DAILY  . DULoxetine (CYMBALTA) 60 MG capsule TAKE ONE CAPSULE BY MOUTH DAILY  . gabapentin (NEURONTIN) 600 MG tablet TAKE ONE TABLET EVERY DAY AT BEDTIME  . HYDROcodone-acetaminophen (NORCO) 10-325 MG tablet Take 1 tablet by mouth every 8 (eight) hours as needed. Do Not Fill Before 05/14/2020  . ipratropium-albuterol (DUONEB) 0.5-2.5  (3) MG/3ML SOLN Take 3 mLs by nebulization every 4 (four) hours as needed (wheezing or SOB). During exacerbation  . levothyroxine (SYNTHROID) 75 MCG tablet Take 1 tablet (75 mcg total) by mouth daily.  . metoprolol tartrate (LOPRESSOR) 50 MG tablet TAKE 2 TABLETS BY MOUTH TWICE DAILY  . [DISCONTINUED] hydrochlorothiazide (HYDRODIURIL) 25 MG tablet Take 1 tablet (25 mg total) by mouth daily.     Allergies:   Fluticasone-salmeterol, Norvasc [amlodipine besylate], and Tape   Social History   Tobacco Use  . Smoking status: Never Smoker  . Smokeless tobacco: Never Used  Substance Use Topics  . Alcohol use: No    Alcohol/week: 0.0 standard drinks  . Drug use: No     Family Hx: The patient's family history includes Asthma in her paternal uncle; Heart attack in her father and paternal uncle; Heart disease in her father and mother; Heart failure in her mother; Hypertension in her maternal grandfather and mother; Pneumonia in her mother; Stroke in her maternal grandfather.  Review of Systems  Constitutional: Negative for chills and fever.  Respiratory: Negative for cough and wheezing.   Gastrointestinal: Negative for diarrhea, hematochezia, melena and vomiting.  Genitourinary: Negative for hematuria.     EKGs/Labs/Other Test Reviewed:    EKG:  EKG is  ordered today.  The ekg ordered today demonstrates normal sinus rhythm, heart rate 68, normal axis, no ST-T wave changes QTC 442, similar to prior tracings  Recent Labs: 12/13/2019: ALT 13; BUN 17; Creat 0.82; Hemoglobin 12.9; Platelets 222; Potassium 4.5; Sodium 138; TSH 1.10   Recent Lipid Panel Lab Results  Component Value Date/Time   CHOL 132 12/13/2019 02:30 PM   TRIG 106 12/13/2019 02:30 PM   HDL 52 12/13/2019 02:30 PM   CHOLHDL 2.5 12/13/2019 02:30 PM   LDLCALC 61 12/13/2019 02:30 PM   LDLDIRECT 154.8 02/20/2009 11:16 AM      Risk Assessment/Calculations:      Physical Exam:    VS:  BP 118/64   Pulse 72   Ht 5' (1.524  m)   Wt 163 lb 9.6 oz (74.2 kg)   SpO2 96%   BMI 31.95 kg/m     Wt Readings from Last 3 Encounters:  06/05/20 163 lb 9.6 oz (74.2 kg)  05/12/20 162 lb (73.5 kg)  04/03/20 165 lb 12.8 oz (75.2 kg)     Constitutional:      Appearance: Healthy appearance. Not in distress.  Pulmonary:     Effort: Pulmonary effort is  normal.     Breath sounds: No wheezing. Rales (??crackles in L base (faint)) present.  Cardiovascular:     Normal rate. Regular rhythm. Normal S1. Normal S2.     Murmurs: There is no murmur.  Edema:    Peripheral edema absent.  Abdominal:     Palpations: Abdomen is soft.  Musculoskeletal:     Cervical back: Neck supple.     Thoracic back: Scoliosis present. Skin:    General: Skin is warm and dry.  Neurological:     Mental Status: Alert and oriented to person, place and time.     Cranial Nerves: Cranial nerves are intact.      ASSESSMENT & PLAN:    1. Coronary artery disease involving native coronary artery of native heart with angina pectoris ([place]) 2. Shortness of breath Remote hx of MI.  Her records also indicate a hx of vasospasm.  She apparently had distal LAD disease at her last cath in the late 1990s.  She had a Myoview in 9/21 that was low risk.  She now presents with symptoms of dyspnea on exertion with minimal activity.  She also has some neck tightness on occasion when she has this.  She feels her dyspnea is not her typical with asthma.  EKG today demonstrates no acute changes.  I am concerned she is having angina.  She has severe scoliosis and cannot lay flat.  Cardiac catheterization may not be possible.  She does not necessarily appear volume overloaded on exam.  She has a faint crackle in the left base.  This may be related to her chest deformity from scoliosis.  I think we can try her on medical therapy initially.  If she has evidence of volume excess, we can adjust her diuretics.  If she fails medical therapy, we will need to figure out how to accomplish a  cardiac catheterization given her issues with scoliosis.  -Obtain CMET, CBC, BNP  -CXR  -Arrange echocardiogram  -If BNP elevated, change HCTZ to furosemide  -Decrease HCTZ to 12.5 mg daily  -She had severe HAs with Nitrates in the past; start Amlodipine 2.5 mg once daily   -Continue atorvastatin, metoprolol, aspirin  -Follow-up in 2 to 3 weeks  3. Essential hypertension Blood pressure fluctuates.  She has tried decreasing HCTZ with elevations in her blood pressure.  Adjust medications as outlined above.  4. Mixed hyperlipidemia LDL optimal on most recent lab work.  Continue current Rx.         Dispo:  Return in about 2 weeks (around 06/19/2020) for Close Follow Up, w/ Dr. Johney Frame, or Richardson Dopp, PA-C, in person.   Medication Adjustments/Labs and Tests Ordered: Current medicines are reviewed at length with the patient today.  Concerns regarding medicines are outlined above.  Tests Ordered: Orders Placed This Encounter  Procedures  . DG Chest 2 View  . Comprehensive metabolic panel  . Pro b natriuretic peptide (BNP)  . CBC  . ECHOCARDIOGRAM COMPLETE   Medication Changes: Meds ordered this encounter  Medications  . hydrochlorothiazide (HYDRODIURIL) 25 MG tablet    Sig: Take 0.5 tablets (12.5 mg total) by mouth daily.    Dispense:  45 tablet    Refill:  3  . amLODipine (NORVASC) 5 MG tablet    Sig: Take 0.5 tablets (2.5 mg total) by mouth daily.    Dispense:  45 tablet    Refill:  3  . aspirin EC 81 MG tablet    Sig: Take 1 tablet (81  mg total) by mouth daily. Swallow whole.    Dispense:  90 tablet    Refill:  3    Signed, Richardson Dopp, PA-C  06/05/2020 1:45 PM    [facility] [place], [place], [place]  [address] Phone: [phone]; Fax: [phone]

## 2020-06-05 ENCOUNTER — Other Ambulatory Visit: Payer: Self-pay

## 2020-06-05 ENCOUNTER — Encounter: Payer: Self-pay | Admitting: Physician Assistant

## 2020-06-05 ENCOUNTER — Ambulatory Visit: Payer: Medicare Other | Admitting: Physician Assistant

## 2020-06-05 VITALS — BP 118/64 | HR 72 | Ht 60.0 in | Wt 163.6 lb

## 2020-06-05 DIAGNOSIS — R0602 Shortness of breath: Secondary | ICD-10-CM | POA: Diagnosis not present

## 2020-06-05 DIAGNOSIS — E782 Mixed hyperlipidemia: Secondary | ICD-10-CM

## 2020-06-05 DIAGNOSIS — I10 Essential (primary) hypertension: Secondary | ICD-10-CM | POA: Diagnosis not present

## 2020-06-05 DIAGNOSIS — I25119 Atherosclerotic heart disease of native coronary artery with unspecified angina pectoris: Secondary | ICD-10-CM

## 2020-06-05 DIAGNOSIS — I2510 Atherosclerotic heart disease of native coronary artery without angina pectoris: Secondary | ICD-10-CM

## 2020-06-05 MED ORDER — AMLODIPINE BESYLATE 5 MG PO TABS: 2.5000 mg | ORAL_TABLET | Freq: Every day | ORAL | 3 refills | Status: DC

## 2020-06-05 MED ORDER — HYDROCHLOROTHIAZIDE 25 MG PO TABS: 12.5000 mg | ORAL_TABLET | Freq: Every day | ORAL | 3 refills | Status: DC

## 2020-06-05 MED ORDER — ASPIRIN EC 81 MG PO TBEC: 81.0000 mg | DELAYED_RELEASE_TABLET | Freq: Every day | ORAL | 3 refills | Status: AC

## 2020-06-05 NOTE — Patient Instructions (Signed)
Medication Instructions:  Your physician has recommended you make the following change in your medication:   START: Aspirin 81mg  daily START: Amlodipine 2.5mg  daily DECREASE: HCTZ to 12.5mg  (1/2 tablet) daily  *If you need a refill on your cardiac medications before your next appointment, please call your pharmacy*   Lab Work: Today: CMET, BNP, CBC  If you have labs (blood work) drawn today and your tests are completely normal, you will receive your results only by: [name] MyChart Message (if you have MyChart) OR . A paper copy in the mail If you have any lab test that is abnormal or we need to change your treatment, we will call you to review the results.   Testing/Procedures: Your physician has requested that you have an echocardiogram. Echocardiography is a painless test that uses sound waves to create images of your heart. It provides your doctor with information about the size and shape of your heart and how well your heart's chambers and valves are working. This procedure takes approximately one hour. There are no restrictions for this procedure.  Chest X-ray Instructions:    1. You may have this done at the [facility], located in the [place] on the 1st floor.    2. You do no have to have an appointment.    3. [place], [place] [address]        [phone]        Monday - Friday  8:00 am - 5:00 pm  Follow-Up: At [facility], you and your health needs are our priority.  As part of our continuing mission to provide you with exceptional heart care, we have created designated Provider Care Teams.  These Care Teams include your primary Cardiologist (physician) and Advanced Practice Providers (APPs -  Physician Assistants and Nurse Practitioners) who all work together to provide you with the care you need, when you need it.  Your next appointment:   06/19/2020  The format for your next appointment:   In Person  Provider:    Gwyndolyn Kaufman, MD

## 2020-06-06 LAB — CBC
Hematocrit: 44 % (ref 34.0–46.6)
Hemoglobin: 14.1 g/dL (ref 11.1–15.9)
MCH: 29.1 pg (ref 26.6–33.0)
MCHC: 32 g/dL (ref 31.5–35.7)
MCV: 91 fL (ref 79–97)
Platelets: 267 10*3/uL (ref 150–450)
RBC: 4.85 x10E6/uL (ref 3.77–5.28)
RDW: 12.1 % (ref 11.7–15.4)
WBC: 6.3 10*3/uL (ref 3.4–10.8)

## 2020-06-06 LAB — PRO B NATRIURETIC PEPTIDE: NT-Pro BNP: 255 pg/mL (ref 0–301)

## 2020-06-06 LAB — COMPREHENSIVE METABOLIC PANEL
ALT: 12 IU/L (ref 0–32)
AST: 25 IU/L (ref 0–40)
Albumin/Globulin Ratio: 2.2 (ref 1.2–2.2)
Albumin: 4.3 g/dL (ref 3.8–4.8)
Alkaline Phosphatase: 104 IU/L (ref 44–121)
BUN/Creatinine Ratio: 19 (ref 12–28)
BUN: 16 mg/dL (ref 8–27)
Bilirubin Total: 0.5 mg/dL (ref 0.0–1.2)
CO2: 30 mmol/L — ABNORMAL HIGH (ref 20–29)
Calcium: 9.4 mg/dL (ref 8.7–10.3)
Chloride: 94 mmol/L — ABNORMAL LOW (ref 96–106)
Creatinine, Ser: 0.86 mg/dL (ref 0.57–1.00)
GFR calc Af Amer: 79 mL/min/{1.73_m2} (ref >59)
GFR calc non Af Amer: 69 mL/min/{1.73_m2} (ref >59)
Globulin, Total: 2 g/dL (ref 1.5–4.5)
Glucose: 99 mg/dL (ref 65–99)
Potassium: 4.5 mmol/L (ref 3.5–5.2)
Sodium: 137 mmol/L (ref 134–144)
Total Protein: 6.3 g/dL (ref 6.0–8.5)

## 2020-06-08 ENCOUNTER — Telehealth: Payer: Self-pay

## 2020-06-08 DIAGNOSIS — H3582 Retinal ischemia: Secondary | ICD-10-CM | POA: Diagnosis not present

## 2020-06-08 DIAGNOSIS — H3562 Retinal hemorrhage, left eye: Secondary | ICD-10-CM | POA: Diagnosis not present

## 2020-06-08 DIAGNOSIS — H34832 Tributary (branch) retinal vein occlusion, left eye, with macular edema: Secondary | ICD-10-CM | POA: Diagnosis not present

## 2020-06-08 DIAGNOSIS — H35372 Puckering of macula, left eye: Secondary | ICD-10-CM | POA: Diagnosis not present

## 2020-06-08 NOTE — Telephone Encounter (Signed)
-----   Message from Liliane Shi, [place] sent at 06/07/2020 10:58 PM EST ----- Creatinine, K+, LFTs, BNP, Hgb normal.   PLAN:  - Continue current medications/treatment plan and follow up as scheduled.  Richardson Dopp, PA-C    06/07/2020 10:55 PM

## 2020-06-08 NOTE — Telephone Encounter (Signed)
The patient has been notified of the result and verbalized understanding.  All questions (if any) were answered. Wilma Flavin, RN 06/08/2020 10:05 AM

## 2020-06-09 ENCOUNTER — Ambulatory Visit: Payer: Medicare Other | Admitting: Registered Nurse

## 2020-06-10 NOTE — Addendum Note (Signed)
Addended by: Carylon Perches on: 06/10/2020 07:42 AM   Modules accepted: Orders

## 2020-06-11 ENCOUNTER — Other Ambulatory Visit: Payer: Self-pay | Admitting: Internal Medicine

## 2020-06-12 ENCOUNTER — Encounter: Payer: Medicare Other | Attending: Physical Medicine & Rehabilitation | Admitting: Registered Nurse

## 2020-06-12 ENCOUNTER — Other Ambulatory Visit: Payer: Self-pay

## 2020-06-12 VITALS — BP 115/76 | HR 83 | Temp 97.6°F | Ht 60.0 in | Wt 165.4 lb

## 2020-06-12 DIAGNOSIS — M48062 Spinal stenosis, lumbar region with neurogenic claudication: Secondary | ICD-10-CM | POA: Insufficient documentation

## 2020-06-12 DIAGNOSIS — M961 Postlaminectomy syndrome, not elsewhere classified: Secondary | ICD-10-CM | POA: Diagnosis not present

## 2020-06-12 DIAGNOSIS — G894 Chronic pain syndrome: Secondary | ICD-10-CM | POA: Diagnosis not present

## 2020-06-12 DIAGNOSIS — Z5181 Encounter for therapeutic drug level monitoring: Secondary | ICD-10-CM | POA: Insufficient documentation

## 2020-06-12 DIAGNOSIS — M24551 Contracture, right hip: Secondary | ICD-10-CM

## 2020-06-12 DIAGNOSIS — Z79891 Long term (current) use of opiate analgesic: Secondary | ICD-10-CM | POA: Diagnosis not present

## 2020-06-12 MED ORDER — HYDROCODONE-ACETAMINOPHEN 10-325 MG PO TABS: 1.0000 | ORAL_TABLET | Freq: Three times a day (TID) | ORAL | 0 refills | Status: DC | PRN

## 2020-06-12 NOTE — Progress Notes (Signed)
Subjective:    Patient ID: Hannah Scott, female    DOB: 05/01/1950, 70 y.o.   MRN: [id number]  HPI: Hannah Scott is a 70 y.o. female who returns for follow up appointment for chronic pain and medication refill. She states her pain is located in her lower back. She rates her pain 7. Her current exercise regime is walking and performing stretching exercises.  Hannah Scott Morphine equivalent is 33.33 MME.  Oral Swab was Performed Today.   Pain Inventory Average Pain 7 Pain Right Now 7 My pain is constant, burning, tingling and aching  In the last 24 hours, has pain interfered with the following? General activity 10 Relation with others 10 Enjoyment of life 10 What TIME of day is your pain at its worst? morning , daytime, evening and night Sleep (in general) Poor  Pain is worse with: walking and standing Pain improves with: rest and medication Relief from Meds: 6  Family History  Problem Relation Age of Onset  . Heart failure Mother   . Pneumonia Mother   . Hypertension Mother   . Heart disease Mother   . Stroke Maternal Grandfather   . Hypertension Maternal Grandfather   . Heart attack Father   . Heart disease Father   . Asthma Paternal Uncle        PAT UNCLES  . Heart attack Paternal Uncle    Social History   Socioeconomic History  . Marital status: Widowed    Spouse name: Not on file  . Number of children: Not on file  . Years of education: Not on file  . Highest education level: Not on file  Occupational History  . Not on file  Tobacco Use  . Smoking status: Never Smoker  . Smokeless tobacco: Never Used  Substance and Sexual Activity  . Alcohol use: No    Alcohol/week: 0.0 standard drinks  . Drug use: No  . Sexual activity: Never    Birth control/protection: Surgical  Other Topics Concern  . Not on file  Social History Narrative  . Not on file   Social Determinants of Health   Financial Resource Strain: Low Risk   . Difficulty of Paying Living Expenses:  Not very hard  Food Insecurity: Not on file  Transportation Needs: No Transportation Needs  . Lack of Transportation (Medical): No  . Lack of Transportation (Non-Medical): No  Physical Activity: Not on file  Stress: Not on file  Social Connections: Not on file   Past Surgical History:  Procedure Laterality Date  . ANKLE SURGERY Left    ligament  . APPENDECTOMY  1987   AT [facility]  . BACK SURGERY     Fusion  . BREAST LUMPECTOMY  2003   radiation on right  . BREAST LUMPECTOMY WITH RADIOACTIVE SEED LOCALIZATION Left 02/12/2016   Procedure: LEFT BREAST LUMPECTOMY WITH RADIOACTIVE SEED LOCALIZATION;  Surgeon: Excell Seltzer, MD;  Location: [facility];  Service: General;  Laterality: Left;  . CARDIOVASCULAR STRESS TEST  09/07/05   Nuclear, was negative  . CATARACT EXTRACTION Bilateral 01,03  . OOPHORECTOMY     LSO -RSO  . PELVIC LAPAROSCOPY  1989   RSO, LYSIS OF ADHESIONS  . TOTAL ABDOMINAL HYSTERECTOMY  1987   LSO, APPENDECTOMY  . TOTAL HIP ARTHROPLASTY Right 10/28/2013   dr Lorin Mercy  . TOTAL HIP ARTHROPLASTY Right 10/28/2013   Procedure: TOTAL HIP ARTHROPLASTY ANTERIOR APPROACH;  Surgeon: Marybelle Killings, MD;  Location: [facility];  Service: Orthopedics;  Laterality: Right;  Right Total Hip Arthroplasty, Direct Anterior Approach   Past Surgical History:  Procedure Laterality Date  . ANKLE SURGERY Left    ligament  . APPENDECTOMY  1987   AT [facility]  . BACK SURGERY     Fusion  . BREAST LUMPECTOMY  2003   radiation on right  . BREAST LUMPECTOMY WITH RADIOACTIVE SEED LOCALIZATION Left 02/12/2016   Procedure: LEFT BREAST LUMPECTOMY WITH RADIOACTIVE SEED LOCALIZATION;  Surgeon: Excell Seltzer, MD;  Location: [facility];  Service: General;  Laterality: Left;  . CARDIOVASCULAR STRESS TEST  09/07/05   Nuclear, was negative  . CATARACT EXTRACTION Bilateral 01,03  . OOPHORECTOMY     LSO -RSO  . PELVIC LAPAROSCOPY  1989   RSO, LYSIS OF ADHESIONS  . TOTAL ABDOMINAL  HYSTERECTOMY  1987   LSO, APPENDECTOMY  . TOTAL HIP ARTHROPLASTY Right 10/28/2013   dr Lorin Mercy  . TOTAL HIP ARTHROPLASTY Right 10/28/2013   Procedure: TOTAL HIP ARTHROPLASTY ANTERIOR APPROACH;  Surgeon: Marybelle Killings, MD;  Location: [facility];  Service: Orthopedics;  Laterality: Right;  Right Total Hip Arthroplasty, Direct Anterior Approach   Past Medical History:  Diagnosis Date  . Anemia    hx  . Arthritis   . Asthma    PFTs, February, 2011, moderate obstructive disease with response to bronchodilators, normal lung volumes, moderate reduction in diffusing capacity  . Atrial septal aneurysm    Echo, 2008-not noted on 13 echo  . CAD (coronary artery disease)    90% distal LAD in the past  /   nuclear, 2008, no ischemia, ejection fraction 70%  . Cancer ([place]) 2002   DUCTAL CIS--S/P LUMPECTOMY, RADIATION AND 6 WEEKS OF TAMOXIFEN  . D-dimer, elevated    January, 2014  . Depression   . Ejection fraction    EF 60%, echo, October, 2008  . Elevated CPK    January, 2014  . Endometriosis 1989   RIGHT TUBE  . Endometriosis 1987   LEFT TUBE/OVARY W FOCAL IN-SITU ENDOMETRIAL ADENOCARCINOMA  . GERD (gastroesophageal reflux disease)    occ  . H/O hiatal hernia    ?  [name] Hyperlipidemia   . Hypertension   . Hypothyroidism    Patient has had in the past that she does not need treatment  . Kyphoscoliosis   . Obstructive airway disease ([place])   . Pinched nerve    lower back  . Shortness of breath   . UTI (lower urinary tract infection)    BP 115/76   Pulse 83   Temp 97.6 F (36.4 C)   Ht 5' (1.524 m)   Wt 165 lb 6.4 oz (75 kg)   SpO2 93%   BMI 32.30 kg/m   Opioid Risk Score:   Fall Risk Score:  `1  Depression screen PHQ 2/9  Depression screen [facility] 2/9 04/03/2020 12/06/2019 11/08/2019 10/11/2019 09/11/2019 09/11/2018 05/16/2018  Decreased Interest 3 3 3 3  0 2 1  Down, Depressed, Hopeless 3 2 2 3 1 2 1   PHQ - 2 Score 6 5 5 6 1 4 2   Altered sleeping - - - 3 - - -  Tired, decreased energy - - -  3 - - -  Change in appetite - - - 3 - - -  Feeling bad or failure about yourself  - - - 0 - - -  Trouble concentrating - - - 2 - - -  Moving slowly or fidgety/restless - - - 0 - - -  Suicidal thoughts - - - 0 - - -  PHQ-9 Score - - - 17 - - -  Difficult doing work/chores - - - - - - -  Some recent data might be hidden    Review of Systems  Musculoskeletal: Positive for back pain.  All other systems reviewed and are negative.      Objective:   Physical Exam Vitals and nursing note reviewed.  Constitutional:      Appearance: Normal appearance.  Cardiovascular:     Rate and Rhythm: Normal rate and regular rhythm.     Pulses: Normal pulses.     Heart sounds: Normal heart sounds.  Pulmonary:     Effort: Pulmonary effort is normal.     Breath sounds: Normal breath sounds.  Musculoskeletal:     Cervical back: Normal range of motion and neck supple.     Comments: Normal Muscle Bulk and Muscle Testing Reveals:  Upper Extremities: Full ROM and Muscle Strength 5/5 Lumbar Paraspinal Tenderness: L-4-L-5 Lower Extremities: Full ROM and Muscle Strength 5/5 Arises from chair slowly using walker for support Antalgic  Gait   Skin:    General: Skin is warm and dry.  Neurological:     Mental Status: She is alert and oriented to person, place, and time.  Psychiatric:        Mood and Affect: Mood normal.        Behavior: Behavior normal.           Assessment & Plan:  1.Lumbar post laminectomy syndromewith severe kyphoscoliosis thoracolumbar spine, s/p lumbar fusion/ 06/12/2020 Continue current medication regimen.Refilled: Hydrocodone 10/325mg  one tablet every8hours may take an extra tablet when pain is severe#100. We will continue the opioid monitoring program, this consists of regular clinic visits, examinations, urine drug screen, pill counts as well as use of [place] Controlled Substance Reporting system. A 12 month History has been reviewed on the [place]  Controlled Substance Reporting Systemon 06/12/2020 2. Right Hip OA: S/P Right Hip Replacement 10/28/2013.06/12/2020 3.Depression: Continue Cymbalta. Has Family Support and Friends. Counseling with Doristine Bosworth.06/12/2020. 4.RightGreater Trochanteric Tenderness:No complaints today.Continue to Monitor. Continue with Ice and Heat Therapy.06/12/2020  F/U in 1 month

## 2020-06-15 ENCOUNTER — Other Ambulatory Visit: Payer: Self-pay | Admitting: Internal Medicine

## 2020-06-16 ENCOUNTER — Other Ambulatory Visit: Payer: Self-pay

## 2020-06-16 ENCOUNTER — Ambulatory Visit (HOSPITAL_COMMUNITY): Payer: Medicare Other | Attending: Internal Medicine

## 2020-06-16 DIAGNOSIS — I25119 Atherosclerotic heart disease of native coronary artery with unspecified angina pectoris: Secondary | ICD-10-CM | POA: Insufficient documentation

## 2020-06-16 DIAGNOSIS — R0602 Shortness of breath: Secondary | ICD-10-CM | POA: Diagnosis not present

## 2020-06-16 LAB — ECHOCARDIOGRAM COMPLETE
Area-P 1/2: 4.21 cm2
S' Lateral: 2.6 cm

## 2020-06-16 NOTE — Progress Notes (Signed)
Cardiology Office Note:    Date:  06/19/2020   ID:  Hannah Scott, DOB 31-Dec-1950, MRN [id number]  PCP:  Binnie Rail, MD   [place]  Cardiologist:  Ena Dawley, MD  Advanced Practice Provider:  No care team member to display Electrophysiologist:  None   Referring MD: Binnie Rail, MD     History of Present Illness:    Hannah Scott is a 70 y.o. female with a hx of prior MI (1991, 15) secondary to coronary vasospasm, known distal LAD disease, HTN, HLD, atrial septal aneurysm, and asthma who was previously followed by Dr. Meda Coffee who presents to clinic for follow-up.  Last saw Richardson Dopp on 06/05/20 where she was continuing to have shortness of breath with minimal activity over the past several months as well as chest/neck discomfort. Concerned for angina given history of vasospasm and known distal LAD disease in th past. Myoview 12/2019 with no ischemia; low risk. BNP on that visit 255. She was scheduled for TTE, started on amlodipine 2.5mg  daily and now returns to clinic for follow-up.  The patient states she has continued to have shortness of breath. Thinks it may be related to her asthma which often flares in the beginning of the year and the end of the year. Cardiac work-up including BNP and TTE reassuringly normal with LVEF 60-65%, no WMA, no significant valve disease, RAP 3. Continues to have intermittent neck tightness that is not exertional related. Occurs rarely and only lasts a couple of seconds before abating. Has not noticed a change with amlodipine but has noted her blood pressure has been low wit episodes of dizziness. No LE edema, orthopnea, PND, nausea. . Admits to not hydrating well and not eating regular meals since her husband passed away.  Past Medical History:  Diagnosis Date  . Anemia    hx  . Arthritis   . Asthma    PFTs, February, 2011, moderate obstructive disease with response to bronchodilators, normal lung volumes, moderate  reduction in diffusing capacity  . Atrial septal aneurysm    Echo, 2008-not noted on 13 echo  . CAD (coronary artery disease)    90% distal LAD in the past  /   nuclear, 2008, no ischemia, ejection fraction 70%  . Cancer ([place]) 2002   DUCTAL CIS--S/P LUMPECTOMY, RADIATION AND 6 WEEKS OF TAMOXIFEN  . D-dimer, elevated    January, 2014  . Depression   . Ejection fraction    EF 60%, echo, October, 2008  . Elevated CPK    January, 2014  . Endometriosis 1989   RIGHT TUBE  . Endometriosis 1987   LEFT TUBE/OVARY W FOCAL IN-SITU ENDOMETRIAL ADENOCARCINOMA  . GERD (gastroesophageal reflux disease)    occ  . H/O hiatal hernia    ?  [name] Hyperlipidemia   . Hypertension   . Hypothyroidism    Patient has had in the past that she does not need treatment  . Kyphoscoliosis   . Obstructive airway disease ([place])   . Pinched nerve    lower back  . Shortness of breath    Echo 2/22: EF 60-65, no RWMA, mild LVH, GR 1  DD, GLS -24%, normal RVSF, mild MR, trivial AI, borderline dilation of aortic root (39 mm)  . UTI (lower urinary tract infection)     Past Surgical History:  Procedure Laterality Date  . ANKLE SURGERY Left    ligament  . APPENDECTOMY  1987   AT [facility]  .  BACK SURGERY     Fusion  . BREAST LUMPECTOMY  2003   radiation on right  . BREAST LUMPECTOMY WITH RADIOACTIVE SEED LOCALIZATION Left 02/12/2016   Procedure: LEFT BREAST LUMPECTOMY WITH RADIOACTIVE SEED LOCALIZATION;  Surgeon: Excell Seltzer, MD;  Location: [facility];  Service: General;  Laterality: Left;  . CARDIOVASCULAR STRESS TEST  09/07/05   Nuclear, was negative  . CATARACT EXTRACTION Bilateral 01,03  . OOPHORECTOMY     LSO -RSO  . PELVIC LAPAROSCOPY  1989   RSO, LYSIS OF ADHESIONS  . TOTAL ABDOMINAL HYSTERECTOMY  1987   LSO, APPENDECTOMY  . TOTAL HIP ARTHROPLASTY Right 10/28/2013   dr Lorin Mercy  . TOTAL HIP ARTHROPLASTY Right 10/28/2013   Procedure: TOTAL HIP ARTHROPLASTY ANTERIOR APPROACH;  Surgeon: Marybelle Killings, MD;  Location: [facility];  Service: Orthopedics;  Laterality: Right;  Right Total Hip Arthroplasty, Direct Anterior Approach    Current Medications: Current Meds  Medication Sig  . albuterol (PROVENTIL HFA;VENTOLIN HFA) 108 (90 Base) MCG/ACT inhaler Inhale 2 puffs into the lungs every 4 (four) hours as needed for wheezing or shortness of breath.  [name] aspirin EC 81 MG tablet Take 1 tablet (81 mg total) by mouth daily. Swallow whole.  [name] atorvastatin (LIPITOR) 20 MG tablet Take 1 tablet (20 mg total) by mouth daily.  . benazepril (LOTENSIN) 20 MG tablet Take 1 tablet (20 mg total) by mouth daily.  [name] BREO ELLIPTA 100-25 MCG/INH AEPB INHALE 1 PUFF INTO THE LUNGS DAILY  . DULoxetine (CYMBALTA) 60 MG capsule TAKE ONE CAPSULE BY MOUTH DAILY  . gabapentin (NEURONTIN) 600 MG tablet TAKE ONE TABLET EVERY DAY AT BEDTIME  . hydrochlorothiazide (HYDRODIURIL) 25 MG tablet Take 0.5 tablets (12.5 mg total) by mouth daily.  [name] HYDROcodone-acetaminophen (NORCO) 10-325 MG tablet Take 1 tablet by mouth every 8 (eight) hours as needed. Do Not Fill Before 06/12/2020  . ipratropium-albuterol (DUONEB) 0.5-2.5 (3) MG/3ML SOLN Take 3 mLs by nebulization every 4 (four) hours as needed (wheezing or SOB). During exacerbation  . levothyroxine (SYNTHROID) 75 MCG tablet Take 1 tablet (75 mcg total) by mouth daily.  . metoprolol tartrate (LOPRESSOR) 50 MG tablet TAKE 2 TABLETS BY MOUTH TWICE DAILY  . [DISCONTINUED] amLODipine (NORVASC) 5 MG tablet Take 0.5 tablets (2.5 mg total) by mouth daily.     Allergies:   Fluticasone-salmeterol, Norvasc [amlodipine besylate], and Tape   Social History   Socioeconomic History  . Marital status: Widowed    Spouse name: Not on file  . Number of children: Not on file  . Years of education: Not on file  . Highest education level: Not on file  Occupational History  . Not on file  Tobacco Use  . Smoking status: Never Smoker  . Smokeless tobacco: Never Used  Substance and Sexual  Activity  . Alcohol use: No    Alcohol/week: 0.0 standard drinks  . Drug use: No  . Sexual activity: Never    Birth control/protection: Surgical  Other Topics Concern  . Not on file  Social History Narrative  . Not on file   Social Determinants of Health   Financial Resource Strain: Low Risk   . Difficulty of Paying Living Expenses: Not very hard  Food Insecurity: Not on file  Transportation Needs: No Transportation Needs  . Lack of Transportation (Medical): No  . Lack of Transportation (Non-Medical): No  Physical Activity: Not on file  Stress: Not on file  Social Connections: Not on file  Family History: The patient's family history includes Asthma in her paternal uncle; Heart attack in her father and paternal uncle; Heart disease in her father and mother; Heart failure in her mother; Hypertension in her maternal grandfather and mother; Pneumonia in her mother; Stroke in her maternal grandfather.  ROS:   Please see the history of present illness.    Review of Systems  Constitutional: Negative for chills and fever.  HENT: Negative for sore throat.   Eyes: Negative for blurred vision and redness.  Respiratory: Positive for shortness of breath.   Cardiovascular: Positive for chest pain. Negative for palpitations, orthopnea, claudication, leg swelling and PND.  Gastrointestinal: Negative for melena, nausea and vomiting.  Genitourinary: Negative for dysuria and flank pain.  Musculoskeletal: Negative for falls.  Neurological: Positive for dizziness. Negative for loss of consciousness.  Endo/Heme/Allergies: Negative for polydipsia.  Psychiatric/Behavioral: Negative for substance abuse.    EKGs/Labs/Other Studies Reviewed:    The following studies were reviewed today:  TTE 06/17/19: IMPRESSIONS  1. Left ventricular ejection fraction, by estimation, is 60 to 65%. The  left ventricle has normal function. The left ventricle has no regional  wall motion abnormalities.  There is mild left ventricular hypertrophy.  Left ventricular diastolic parameters  are consistent with Grade I diastolic dysfunction (impaired relaxation).  The average left ventricular global longitudinal strain is -24.0 %. The  global longitudinal strain is normal.  2. Right ventricular systolic function is normal. The right ventricular  size is normal. Tricuspid regurgitation signal is inadequate for assessing  PA pressure.  3. The mitral valve is normal in structure. Mild mitral valve  regurgitation. No evidence of mitral stenosis.  4. The aortic valve was not well visualized. Aortic valve regurgitation  is trivial. No aortic stenosis is present.  5. There is borderline dilatation of the aortic root, measuring 39 mm.  6. The inferior vena cava is normal in size with greater than 50%  respiratory variability, suggesting right atrial pressure of 3 mmHg.   TTE 2019: Study Conclusions   - Left ventricle: The cavity size was normal. Wall thickness was  increased in a pattern of moderate LVH. Systolic function was  normal. The estimated ejection fraction was in the range of 55%  to 60%. Wall motion was normal; there were no regional wall  motion abnormalities. Doppler parameters are consistent with  abnormal left ventricular relaxation (grade 1 diastolic  dysfunction).  - Aortic valve: There was trivial regurgitation.  - Ascending aorta: The ascending aorta was mildly dilated.  - Mitral valve: Calcified annulus. Mildly thickened leaflets .   Impressions:   - Normal LV systolic function; moderate LVH; mild diastolic  dysfunction; trace AI; mildly dilated ascending aorta.   Myoview 12/2019:  The left ventricular ejection fraction is normal (55-65%).  Nuclear stress EF: 64%. No wall motion abnormalities  There was no ST segment deviation noted during stress.  The study is normal.  This is a low risk study. There is no infarct or ischemia  identified.   Recent Labs: 12/13/2019: TSH 1.10 06/05/2020: ALT 12; BUN 16; Creatinine, Ser 0.86; Hemoglobin 14.1; NT-Pro BNP 255; Platelets 267; Potassium 4.5; Sodium 137  Recent Lipid Panel    Component Value Date/Time   CHOL 132 12/13/2019 1430   TRIG 106 12/13/2019 1430   HDL 52 12/13/2019 1430   CHOLHDL 2.5 12/13/2019 1430   VLDL 17.6 07/10/2019 1501   LDLCALC 61 12/13/2019 1430   LDLDIRECT 154.8 02/20/2009 1116     Physical Exam:  VS:  BP 95/68   Pulse 79   Ht 5' (1.524 m)   Wt 162 lb (73.5 kg)   SpO2 94%   BMI 31.64 kg/m     Wt Readings from Last 3 Encounters:  06/19/20 162 lb (73.5 kg)  06/12/20 165 lb 6.4 oz (75 kg)  06/05/20 163 lb 9.6 oz (74.2 kg)     GEN:  Comfortable, elderly female. Ambulated with rolling walker. HEENT: Normal NECK: No JVD; No carotid bruits CARDIAC: RRR, no murmurs, rubs, gallops RESPIRATORY:  Clear to auscultation without rales, wheezing or rhonchi  ABDOMEN: Soft, non-tender, non-distended MUSCULOSKELETAL:  No edema; Has scoliosis  SKIN: Warm and dry NEUROLOGIC:  Alert and oriented x 3 PSYCHIATRIC:  Normal affect   ASSESSMENT:    1. Shortness of breath   2. Intermittent asthma without complication, unspecified asthma severity   3. Essential hypertension   4. DOE (dyspnea on exertion)   5. Coronary artery disease involving native coronary artery of native heart without angina pectoris   6. Mixed hyperlipidemia    PLAN:    In order of problems listed above:  #CAD with distal LAD #Vasospasm: Myoview 12/2019 with no evidence of ischemia. TTE with normal BiV function, no significant valvular abnormalities. Patient has rare, intermittent neck tightness but this is non-exertional and only lasts a couple of seconds before abating. Was trialed on amlodipine for possible vasospasm, however, no significant change in symptoms and patient has noted more frequent episodes of hypotension. Given reassuring cardiac work-up, no further  testing needed at this time.  -Cardiac work-up including myoview and TTE reassuringly normal -Continue ASA and statin -Will hold amlodipine due to episodes of hypotension at home  #DOE: #Asthma: Continues to have SOB on exertion that is unchanged from prior visit. Cardiac work-up including BNP, TTE and myoview reassuringly normal. Suspect symptoms may be related to underlying asthma as often flares during this time of year. Has not followed with a pulmonologist for years and is interested in establishing care. -Refer to pulm  -Cardiac work-up including TTE, BNP and myoview reassuring  #HTN: Soft blood pressures today with episodes of dizziness at home. -Stop amlodipine given episodes of hypotension with dizziness at home -Continue HCTZ 12.5mg  for now---may change to alternative if continues to not eat/drink well -Continue benzapril 10mg  daily  #HLD: LDL 61. -Continue atorvastatin 20mg  daily     Medication Adjustments/Labs and Tests Ordered: Current medicines are reviewed at length with the patient today.  Concerns regarding medicines are outlined above.  Orders Placed This Encounter  Procedures  . Ambulatory referral to Pulmonology   No orders of the defined types were placed in this encounter.   Patient Instructions  Medication Instructions:  Your physician has recommended you make the following change in your medication:  1.  STOP the Amlodpine   *If you need a refill on your cardiac medications before your next appointment, please call your pharmacy*   Lab Work: None ordered  If you have labs (blood work) drawn today and your tests are completely normal, you will receive your results only by: [name] MyChart Message (if you have MyChart) OR . A paper copy in the mail If you have any lab test that is abnormal or we need to change your treatment, we will call you to review the results.   Testing/Procedures: None ordered   You have been referred to [facility].   They will call you to schedule an appointment.    Follow-Up: At [facility], you  and your health needs are our priority.  As part of our continuing mission to provide you with exceptional heart care, we have created designated Provider Care Teams.  These Care Teams include your primary Cardiologist (physician) and Advanced Practice Providers (APPs -  Physician Assistants and Nurse Practitioners) who all work together to provide you with the care you need, when you need it.  We recommend signing up for the patient portal called "MyChart".  Sign up information is provided on this After Visit Summary.  MyChart is used to connect with patients for Virtual Visits (Telemedicine).  Patients are able to view lab/test results, encounter notes, upcoming appointments, etc.  Non-urgent messages can be sent to your provider as well.   To learn more about what you can do with MyChart, go to [email or web address].    Your next appointment:   6 week(s)  The format for your next appointment:   In Person  Provider:   Gwyndolyn Kaufman, MD   Other Instructions      Signed, Freada Bergeron, MD  06/19/2020 1:58 PM    [place]

## 2020-06-17 ENCOUNTER — Encounter: Payer: Medicare Other | Admitting: Internal Medicine

## 2020-06-17 ENCOUNTER — Encounter: Payer: Self-pay | Admitting: Physician Assistant

## 2020-06-17 ENCOUNTER — Telehealth: Payer: Self-pay | Admitting: *Deleted

## 2020-06-17 LAB — DRUG TOX MONITOR 1 W/CONF, ORAL FLD
Amphetamines: NEGATIVE ng/mL (ref <10)
Barbiturates: NEGATIVE ng/mL (ref <10)
Benzodiazepines: NEGATIVE ng/mL (ref <0.50)
Buprenorphine: NEGATIVE ng/mL (ref <0.10)
Cocaine: NEGATIVE ng/mL (ref <5.0)
Codeine: NEGATIVE ng/mL (ref <2.5)
Dihydrocodeine: 18.8 ng/mL — ABNORMAL HIGH (ref <2.5)
Fentanyl: NEGATIVE ng/mL (ref <0.10)
Heroin Metabolite: NEGATIVE ng/mL (ref <1.0)
Hydrocodone: 126.2 ng/mL — ABNORMAL HIGH (ref <2.5)
Hydromorphone: NEGATIVE ng/mL (ref <2.5)
MARIJUANA: NEGATIVE ng/mL (ref <2.5)
MDMA: NEGATIVE ng/mL (ref <10)
Meprobamate: NEGATIVE ng/mL (ref <2.5)
Methadone: NEGATIVE ng/mL (ref <5.0)
Morphine: NEGATIVE ng/mL (ref <2.5)
Nicotine Metabolite: NEGATIVE ng/mL (ref <5.0)
Norhydrocodone: 3.8 ng/mL — ABNORMAL HIGH (ref <2.5)
Noroxycodone: NEGATIVE ng/mL (ref <2.5)
Opiates: POSITIVE ng/mL — AB (ref <2.5)
Oxycodone: NEGATIVE ng/mL (ref <2.5)
Oxymorphone: NEGATIVE ng/mL (ref <2.5)
Phencyclidine: NEGATIVE ng/mL (ref <10)
Tapentadol: NEGATIVE ng/mL (ref <5.0)
Zolpidem: NEGATIVE ng/mL (ref <5.0)

## 2020-06-17 LAB — DRUG TOX ALC METAB W/CON, ORAL FLD: Alcohol Metabolite: NEGATIVE ng/mL (ref <25)

## 2020-06-17 NOTE — Telephone Encounter (Signed)
Oral swab drug screen was consistent for prescribed medications.  ?

## 2020-06-19 ENCOUNTER — Ambulatory Visit: Payer: Medicare Other | Admitting: Cardiology

## 2020-06-19 ENCOUNTER — Other Ambulatory Visit: Payer: Self-pay

## 2020-06-19 ENCOUNTER — Encounter: Payer: Self-pay | Admitting: Cardiology

## 2020-06-19 VITALS — BP 95/68 | HR 79 | Ht 60.0 in | Wt 162.0 lb

## 2020-06-19 DIAGNOSIS — I2510 Atherosclerotic heart disease of native coronary artery without angina pectoris: Secondary | ICD-10-CM

## 2020-06-19 DIAGNOSIS — I10 Essential (primary) hypertension: Secondary | ICD-10-CM | POA: Diagnosis not present

## 2020-06-19 DIAGNOSIS — E782 Mixed hyperlipidemia: Secondary | ICD-10-CM

## 2020-06-19 DIAGNOSIS — J4520 Mild intermittent asthma, uncomplicated: Secondary | ICD-10-CM

## 2020-06-19 DIAGNOSIS — R0609 Other forms of dyspnea: Secondary | ICD-10-CM

## 2020-06-19 DIAGNOSIS — R0600 Dyspnea, unspecified: Secondary | ICD-10-CM

## 2020-06-19 DIAGNOSIS — R0602 Shortness of breath: Secondary | ICD-10-CM | POA: Diagnosis not present

## 2020-06-19 NOTE — Patient Instructions (Signed)
Medication Instructions:  Your physician has recommended you make the following change in your medication:  1.  STOP the Amlodpine   *If you need a refill on your cardiac medications before your next appointment, please call your pharmacy*   Lab Work: None ordered  If you have labs (blood work) drawn today and your tests are completely normal, you will receive your results only by: [name] MyChart Message (if you have MyChart) OR . A paper copy in the mail If you have any lab test that is abnormal or we need to change your treatment, we will call you to review the results.   Testing/Procedures: None ordered   You have been referred to [facility].  They will call you to schedule an appointment.    Follow-Up: At [facility], you and your health needs are our priority.  As part of our continuing mission to provide you with exceptional heart care, we have created designated Provider Care Teams.  These Care Teams include your primary Cardiologist (physician) and Advanced Practice Providers (APPs -  Physician Assistants and Nurse Practitioners) who all work together to provide you with the care you need, when you need it.  We recommend signing up for the patient portal called "MyChart".  Sign up information is provided on this After Visit Summary.  MyChart is used to connect with patients for Virtual Visits (Telemedicine).  Patients are able to view lab/test results, encounter notes, upcoming appointments, etc.  Non-urgent messages can be sent to your provider as well.   To learn more about what you can do with MyChart, go to [email or web address].    Your next appointment:   6 week(s)  The format for your next appointment:   In Person  Provider:   Gwyndolyn Kaufman, MD   Other Instructions

## 2020-06-26 ENCOUNTER — Encounter: Payer: Self-pay | Admitting: Internal Medicine

## 2020-06-28 MED ORDER — DULOXETINE HCL 30 MG PO CPEP: 30.0000 mg | ORAL_CAPSULE | Freq: Every day | ORAL | 1 refills | Status: DC

## 2020-07-02 ENCOUNTER — Other Ambulatory Visit: Payer: Self-pay | Admitting: Internal Medicine

## 2020-07-05 ENCOUNTER — Other Ambulatory Visit: Payer: Self-pay | Admitting: Internal Medicine

## 2020-07-06 DIAGNOSIS — H3582 Retinal ischemia: Secondary | ICD-10-CM | POA: Diagnosis not present

## 2020-07-06 DIAGNOSIS — H35372 Puckering of macula, left eye: Secondary | ICD-10-CM | POA: Diagnosis not present

## 2020-07-06 DIAGNOSIS — H34832 Tributary (branch) retinal vein occlusion, left eye, with macular edema: Secondary | ICD-10-CM | POA: Diagnosis not present

## 2020-07-06 DIAGNOSIS — H3562 Retinal hemorrhage, left eye: Secondary | ICD-10-CM | POA: Diagnosis not present

## 2020-07-10 ENCOUNTER — Encounter: Payer: Medicare Other | Admitting: Registered Nurse

## 2020-07-12 NOTE — Patient Instructions (Addendum)
Blood work was ordered.     Medications changes include :  none   Your prescription(s) have been submitted to your pharmacy. Please take as directed and contact our office if you believe you are having problem(s) with the medication(s).    Please followup in 6 months    Health Maintenance, Female Adopting a healthy lifestyle and getting preventive care are important in promoting health and wellness. Ask your health care provider about:  The right schedule for you to have regular tests and exams.  Things you can do on your own to prevent diseases and keep yourself healthy. What should I know about diet, weight, and exercise? Eat a healthy diet  Eat a diet that includes plenty of vegetables, fruits, low-fat dairy products, and lean protein.  Do not eat a lot of foods that are high in solid fats, added sugars, or sodium.   Maintain a healthy weight Body mass index (BMI) is used to identify weight problems. It estimates body fat based on height and weight. Your health care provider can help determine your BMI and help you achieve or maintain a healthy weight. Get regular exercise Get regular exercise. This is one of the most important things you can do for your health. Most adults should:  Exercise for at least 150 minutes each week. The exercise should increase your heart rate and make you sweat (moderate-intensity exercise).  Do strengthening exercises at least twice a week. This is in addition to the moderate-intensity exercise.  Spend less time sitting. Even light physical activity can be beneficial. Watch cholesterol and blood lipids Have your blood tested for lipids and cholesterol at 71 years of age, then have this test every 5 years. Have your cholesterol levels checked more often if:  Your lipid or cholesterol levels are high.  You are older than 71 years of age.  You are at high risk for heart disease. What should I know about cancer screening? Depending on your  health history and family history, you may need to have cancer screening at various ages. This may include screening for:  Breast cancer.  Cervical cancer.  Colorectal cancer.  Skin cancer.  Lung cancer. What should I know about heart disease, diabetes, and high blood pressure? Blood pressure and heart disease  High blood pressure causes heart disease and increases the risk of stroke. This is more likely to develop in people who have high blood pressure readings, are of African descent, or are overweight.  Have your blood pressure checked: ? Every 3-5 years if you are 18-39 years of age. ? Every year if you are 40 years old or older. Diabetes Have regular diabetes screenings. This checks your fasting blood sugar level. Have the screening done:  Once every three years after age 40 if you are at a normal weight and have a low risk for diabetes.  More often and at a younger age if you are overweight or have a high risk for diabetes. What should I know about preventing infection? Hepatitis B If you have a higher risk for hepatitis B, you should be screened for this virus. Talk with your health care provider to find out if you are at risk for hepatitis B infection. Hepatitis C Testing is recommended for:  Everyone born from 1945 through 1965.  Anyone with known risk factors for hepatitis C. Sexually transmitted infections (STIs)  Get screened for STIs, including gonorrhea and chlamydia, if: ? You are sexually active and are younger than 71 years of   age. ? You are older than 71 years of age and your health care provider tells you that you are at risk for this type of infection. ? Your sexual activity has changed since you were last screened, and you are at increased risk for chlamydia or gonorrhea. Ask your health care provider if you are at risk.  Ask your health care provider about whether you are at high risk for HIV. Your health care provider may recommend a prescription  medicine to help prevent HIV infection. If you choose to take medicine to prevent HIV, you should first get tested for HIV. You should then be tested every 3 months for as long as you are taking the medicine. Pregnancy  If you are about to stop having your period (premenopausal) and you may become pregnant, seek counseling before you get pregnant.  Take 400 to 800 micrograms (mcg) of folic acid every day if you become pregnant.  Ask for birth control (contraception) if you want to prevent pregnancy. Osteoporosis and menopause Osteoporosis is a disease in which the bones lose minerals and strength with aging. This can result in bone fractures. If you are 65 years old or older, or if you are at risk for osteoporosis and fractures, ask your health care provider if you should:  Be screened for bone loss.  Take a calcium or vitamin D supplement to lower your risk of fractures.  Be given hormone replacement therapy (HRT) to treat symptoms of menopause. Follow these instructions at home: Lifestyle  Do not use any products that contain nicotine or tobacco, such as cigarettes, e-cigarettes, and chewing tobacco. If you need help quitting, ask your health care provider.  Do not use street drugs.  Do not share needles.  Ask your health care provider for help if you need support or information about quitting drugs. Alcohol use  Do not drink alcohol if: ? Your health care provider tells you not to drink. ? You are pregnant, may be pregnant, or are planning to become pregnant.  If you drink alcohol: ? Limit how much you use to 0-1 drink a day. ? Limit intake if you are breastfeeding.  Be aware of how much alcohol is in your drink. In the [place] one drink equals one 12 oz bottle of beer (355 mL), one 5 oz glass of wine (148 mL), or one 1 oz glass of hard liquor (44 mL). General instructions  Schedule regular health, dental, and eye exams.  Stay current with your vaccines.  Tell your health  care provider if: ? You often feel depressed. ? You have ever been abused or do not feel safe at home. Summary  Adopting a healthy lifestyle and getting preventive care are important in promoting health and wellness.  Follow your health care provider's instructions about healthy diet, exercising, and getting tested or screened for diseases.  Follow your health care provider's instructions on monitoring your cholesterol and blood pressure. This information is not intended to replace advice given to you by your health care provider. Make sure you discuss any questions you have with your health care provider. Document Revised: 04/04/2018 Document Reviewed: 04/04/2018 Elsevier Patient Education  2021 Elsevier Inc.  

## 2020-07-12 NOTE — Progress Notes (Signed)
Subjective:    Patient ID: Hannah Scott, female    DOB: [birth year], 70 y.o.   MRN: [id number]   This visit occurred during the SARS-CoV-2 public health emergency.  Safety protocols were in place, including screening questions prior to the visit, additional usage of staff PPE, and extensive cleaning of exam room while observing appropriate contact time as indicated for disinfecting solutions.    HPI She is here for a physical exam.    Her asthma has been flaring up.  She is more SOB with exertion - it happens easily.  She has occ wheeze. She has an occ cough.  No cough symptoms or fever.  She is very sedentary.  She takes Firefighter daily.  She does not have an albuterol inhaler.   She sees pulm in 10 days.      Medications and allergies reviewed with patient and updated if appropriate.  Patient Active Problem List   Diagnosis Date Noted  . Insomnia 09/11/2019  . Murmur, cardiac 01/16/2018  . Moderate persistent asthma with exacerbation 12/13/2017  . Osteoporosis 01/07/2017  . Acute bronchitis 10/17/2016  . Prediabetes 07/07/2016  . History of breast cancer 01/06/2016  . Intraductal papilloma of left breast 01/06/2016  . Essential hypertension 06/08/2015  . DOE (dyspnea on exertion) 06/08/2015  . Fatigue 05/20/2015  . Back pain 04/22/2015  . Anemia due to blood loss 11/27/2013  . Elevated CPK   . Multinodular goiter 12/01/2011  . Asthma   . Hypothyroidism   . CAD (coronary artery disease)   . Atrial septal aneurysm   . Hyperlipidemia   . Scoliosis   . Spinal stenosis, lumbar region, with neurogenic claudication 07/01/2011  . Lumbar postlaminectomy syndrome 07/01/2011  . Osteoarthritis of right hip 07/01/2011  . Contracture of right hip 07/01/2011  . Endometriosis   . Depression 12/24/2009  . Migraine headache 05/19/2006    Current Outpatient Medications on File Prior to Visit  Medication Sig Dispense Refill  . aspirin EC 81 MG tablet Take 1 tablet (81 mg total) by  mouth daily. Swallow whole. 90 tablet 3  . atorvastatin (LIPITOR) 20 MG tablet Take 1 tablet (20 mg total) by mouth daily. 90 tablet 3  . benazepril (LOTENSIN) 20 MG tablet Take 1 tablet (20 mg total) by mouth daily. 90 tablet 3  . BREO ELLIPTA 100-25 MCG/INH AEPB INHALE 1 PUFF INTO THE LUNGS DAILY 60 each 1  . DULoxetine (CYMBALTA) 30 MG capsule Take 1 capsule (30 mg total) by mouth daily. Take in addition to 60 mg for a total of 90 mg daily 90 capsule 1  . DULoxetine (CYMBALTA) 60 MG capsule TAKE ONE CAPSULE BY MOUTH DAILY 30 capsule 0  . gabapentin (NEURONTIN) 600 MG tablet TAKE ONE TABLET EVERY DAY AT BEDTIME 30 tablet 5  . hydrochlorothiazide (HYDRODIURIL) 25 MG tablet Take 0.5 tablets (12.5 mg total) by mouth daily. 45 tablet 3  . HYDROcodone-acetaminophen (NORCO) 10-325 MG tablet Take 1 tablet by mouth every 8 (eight) hours as needed. Do Not Fill Before 06/12/2020 100 tablet 0  . ipratropium-albuterol (DUONEB) 0.5-2.5 (3) MG/3ML SOLN Take 3 mLs by nebulization every 4 (four) hours as needed (wheezing or SOB). During exacerbation 360 mL 1  . levothyroxine (SYNTHROID) 75 MCG tablet Take 1 tablet (75 mcg total) by mouth daily. 90 tablet 2  . metoprolol tartrate (LOPRESSOR) 50 MG tablet TAKE 2 TABLETS BY MOUTH TWICE DAILY 360 tablet 3   No current facility-administered medications on file prior to visit.  Past Medical History:  Diagnosis Date  . Anemia    hx  . Arthritis   . Asthma    PFTs, February, 2011, moderate obstructive disease with response to bronchodilators, normal lung volumes, moderate reduction in diffusing capacity  . Atrial septal aneurysm    Echo, 2008-not noted on 13 echo  . CAD (coronary artery disease)    90% distal LAD in the past  /   nuclear, 2008, no ischemia, ejection fraction 70%  . Cancer ([place]) 2002   DUCTAL CIS--S/P LUMPECTOMY, RADIATION AND 6 WEEKS OF TAMOXIFEN  . D-dimer, elevated    January, 2014  . Depression   . Ejection fraction    EF 60%, echo,  October, 2008  . Elevated CPK    January, 2014  . Endometriosis 1989   RIGHT TUBE  . Endometriosis 1987   LEFT TUBE/OVARY W FOCAL IN-SITU ENDOMETRIAL ADENOCARCINOMA  . GERD (gastroesophageal reflux disease)    occ  . H/O hiatal hernia    ?  [name] Hyperlipidemia   . Hypertension   . Hypothyroidism    Patient has had in the past that she does not need treatment  . Kyphoscoliosis   . Obstructive airway disease ([place])   . Pinched nerve    lower back  . Shortness of breath    Echo 2/22: EF 60-65, no RWMA, mild LVH, GR 1  DD, GLS -24%, normal RVSF, mild MR, trivial AI, borderline dilation of aortic root (39 mm)  . UTI (lower urinary tract infection)     Past Surgical History:  Procedure Laterality Date  . ANKLE SURGERY Left    ligament  . APPENDECTOMY  1987   AT [facility]  . BACK SURGERY     Fusion  . BREAST LUMPECTOMY  2003   radiation on right  . BREAST LUMPECTOMY WITH RADIOACTIVE SEED LOCALIZATION Left 02/12/2016   Procedure: LEFT BREAST LUMPECTOMY WITH RADIOACTIVE SEED LOCALIZATION;  Surgeon: Excell Seltzer, MD;  Location: [facility];  Service: General;  Laterality: Left;  . CARDIOVASCULAR STRESS TEST  09/07/05   Nuclear, was negative  . CATARACT EXTRACTION Bilateral 01,03  . OOPHORECTOMY     LSO -RSO  . PELVIC LAPAROSCOPY  1989   RSO, LYSIS OF ADHESIONS  . TOTAL ABDOMINAL HYSTERECTOMY  1987   LSO, APPENDECTOMY  . TOTAL HIP ARTHROPLASTY Right 10/28/2013   dr Lorin Mercy  . TOTAL HIP ARTHROPLASTY Right 10/28/2013   Procedure: TOTAL HIP ARTHROPLASTY ANTERIOR APPROACH;  Surgeon: Marybelle Killings, MD;  Location: [facility];  Service: Orthopedics;  Laterality: Right;  Right Total Hip Arthroplasty, Direct Anterior Approach    Social History   Socioeconomic History  . Marital status: Widowed    Spouse name: Not on file  . Number of children: Not on file  . Years of education: Not on file  . Highest education level: Not on file  Occupational History  . Not on file  Tobacco Use   . Smoking status: Never Smoker  . Smokeless tobacco: Never Used  Substance and Sexual Activity  . Alcohol use: No    Alcohol/week: 0.0 standard drinks  . Drug use: No  . Sexual activity: Never    Birth control/protection: Surgical  Other Topics Concern  . Not on file  Social History Narrative  . Not on file   Social Determinants of Health   Financial Resource Strain: Low Risk   . Difficulty of Paying Living Expenses: Not very hard  Food Insecurity: Not on file  Transportation Needs:  No Transportation Needs  . Lack of Transportation (Medical): No  . Lack of Transportation (Non-Medical): No  Physical Activity: Not on file  Stress: Not on file  Social Connections: Not on file    Family History  Problem Relation Age of Onset  . Heart failure Mother   . Pneumonia Mother   . Hypertension Mother   . Heart disease Mother   . Stroke Maternal Grandfather   . Hypertension Maternal Grandfather   . Heart attack Father   . Heart disease Father   . Asthma Paternal Uncle        PAT UNCLES  . Heart attack Paternal Uncle     Review of Systems  Constitutional: Negative for appetite change (low - no change), chills and fever.  HENT: Positive for trouble swallowing (occ throat just closes up and food will come back up - sometimes painful) and voice change.   Eyes: Negative for visual disturbance.  Respiratory: Positive for cough (occ), shortness of breath (with exertion - little more than baseline) and wheezing (sometimes).   Cardiovascular: Negative for chest pain, palpitations and leg swelling.  Gastrointestinal: Positive for diarrhea (occ). Negative for abdominal pain, blood in stool, constipation and nausea.       Gerd occ  Genitourinary: Negative for dysuria.  Musculoskeletal: Positive for arthralgias and back pain.  Skin: Negative for rash.  Neurological: Positive for light-headedness (occ with fluctuations of BP). Negative for headaches.  Psychiatric/Behavioral: Positive for  dysphoric mood. The patient is not nervous/anxious.        Objective:   Vitals:   07/13/20 1422  BP: 110/80  Pulse: (!) 52  Temp: 98.4 F (36.9 C)  SpO2: 92%   Filed Weights   07/13/20 1422  Weight: 163 lb (73.9 kg)   Body mass index is 31.83 kg/m.  BP Readings from Last 3 Encounters:  07/13/20 110/80  06/19/20 95/68  06/12/20 115/76    Wt Readings from Last 3 Encounters:  07/13/20 163 lb (73.9 kg)  06/19/20 162 lb (73.5 kg)  06/12/20 165 lb 6.4 oz (75 kg)     Physical Exam Constitutional: She appears well-developed and well-nourished. No distress.  HENT:  Head: Normocephalic and atraumatic.  Right Ear: External ear normal. Normal ear canal and TM Left Ear: External ear normal.  Normal ear canal and TM Mouth/Throat: Oropharynx is clear and moist.  Eyes: Conjunctivae and EOM are normal.  Neck: Neck supple. No tracheal deviation present. No thyromegaly present.  No carotid bruit  Cardiovascular: Normal rate, regular rhythm and normal heart sounds.   No murmur heard.  No edema. Pulmonary/Chest: Effort normal and breath sounds normal. No respiratory distress. She has no wheezes. She has no rales.  Breast: deferred   Abdominal: Soft. She exhibits no distension. There is no tenderness.  Lymphadenopathy: She has no cervical adenopathy.  Skin: Skin is warm and dry. She is not diaphoretic.  Psychiatric: She has a normal mood and affect. Her behavior is normal.        Assessment & Plan:   Physical exam: Screening blood work    ordered Immunizations  had covid booster, deferred Flu vaccine, discussed shingrix Colonoscopy  Due - she will schedule Mammogram   Up to date  Gyn  n/a Dexa  Up to date  Eye exams  Up to date  Exercise  Not able to exercise Weight  Ok - not eating much, unable to exercise Substance abuse  none      See Problem List for Assessment  and Plan of chronic medical problems.

## 2020-07-13 ENCOUNTER — Ambulatory Visit (INDEPENDENT_AMBULATORY_CARE_PROVIDER_SITE_OTHER): Payer: Medicare Other | Admitting: Internal Medicine

## 2020-07-13 ENCOUNTER — Other Ambulatory Visit: Payer: Self-pay

## 2020-07-13 ENCOUNTER — Encounter: Payer: Self-pay | Admitting: Internal Medicine

## 2020-07-13 VITALS — BP 110/80 | HR 52 | Temp 98.4°F | Ht 60.0 in | Wt 163.0 lb

## 2020-07-13 DIAGNOSIS — M810 Age-related osteoporosis without current pathological fracture: Secondary | ICD-10-CM

## 2020-07-13 DIAGNOSIS — I10 Essential (primary) hypertension: Secondary | ICD-10-CM

## 2020-07-13 DIAGNOSIS — F3289 Other specified depressive episodes: Secondary | ICD-10-CM

## 2020-07-13 DIAGNOSIS — E039 Hypothyroidism, unspecified: Secondary | ICD-10-CM

## 2020-07-13 DIAGNOSIS — R0600 Dyspnea, unspecified: Secondary | ICD-10-CM

## 2020-07-13 DIAGNOSIS — R7303 Prediabetes: Secondary | ICD-10-CM | POA: Diagnosis not present

## 2020-07-13 DIAGNOSIS — R0609 Other forms of dyspnea: Secondary | ICD-10-CM

## 2020-07-13 DIAGNOSIS — I25119 Atherosclerotic heart disease of native coronary artery with unspecified angina pectoris: Secondary | ICD-10-CM

## 2020-07-13 DIAGNOSIS — E7849 Other hyperlipidemia: Secondary | ICD-10-CM

## 2020-07-13 DIAGNOSIS — Z0000 Encounter for general adult medical examination without abnormal findings: Secondary | ICD-10-CM

## 2020-07-13 DIAGNOSIS — H34832 Tributary (branch) retinal vein occlusion, left eye, with macular edema: Secondary | ICD-10-CM | POA: Diagnosis not present

## 2020-07-13 LAB — COMPREHENSIVE METABOLIC PANEL
ALT: 12 U/L (ref 0–35)
AST: 20 U/L (ref 0–37)
Albumin: 4 g/dL (ref 3.5–5.2)
Alkaline Phosphatase: 80 U/L (ref 39–117)
BUN: 14 mg/dL (ref 6–23)
CO2: 36 mEq/L — ABNORMAL HIGH (ref 19–32)
Calcium: 9.5 mg/dL (ref 8.4–10.5)
Chloride: 96 mEq/L (ref 96–112)
Creatinine, Ser: 0.78 mg/dL (ref 0.40–1.20)
GFR: 76.67 mL/min (ref >60.00)
Glucose, Bld: 90 mg/dL (ref 70–99)
Potassium: 4.8 mEq/L (ref 3.5–5.1)
Sodium: 138 mEq/L (ref 135–145)
Total Bilirubin: 0.6 mg/dL (ref 0.2–1.2)
Total Protein: 6.2 g/dL (ref 6.0–8.3)

## 2020-07-13 LAB — LIPID PANEL
Cholesterol: 138 mg/dL (ref 0–200)
HDL: 50.8 mg/dL (ref >39.00)
LDL Cholesterol: 72 mg/dL (ref 0–99)
NonHDL: 87.2
Total CHOL/HDL Ratio: 3
Triglycerides: 77 mg/dL (ref 0.0–149.0)
VLDL: 15.4 mg/dL (ref 0.0–40.0)

## 2020-07-13 LAB — TSH: TSH: 3.91 u[IU]/mL (ref 0.35–4.50)

## 2020-07-13 LAB — HEMOGLOBIN A1C: Hgb A1c MFr Bld: 6.1 % (ref 4.6–6.5)

## 2020-07-13 LAB — VITAMIN D 25 HYDROXY (VIT D DEFICIENCY, FRACTURES): VITD: 17.43 ng/mL — ABNORMAL LOW (ref 30.00–100.00)

## 2020-07-13 MED ORDER — ALBUTEROL SULFATE HFA 108 (90 BASE) MCG/ACT IN AERS: 2.0000 | INHALATION_SPRAY | RESPIRATORY_TRACT | 0 refills | Status: DC | PRN

## 2020-07-13 MED ORDER — ALBUTEROL SULFATE HFA 108 (90 BASE) MCG/ACT IN AERS: 2.0000 | INHALATION_SPRAY | RESPIRATORY_TRACT | 11 refills | Status: DC | PRN

## 2020-07-13 NOTE — Assessment & Plan Note (Signed)
Chronic Check vitamin D level Not able to exercise Not sure she wants to be on medication or not-have discussed medications in the past DEXA up-to-date

## 2020-07-13 NOTE — Assessment & Plan Note (Signed)
Chronic No chest pain - Saw cardiology regarding her dyspnea on exertion and it was not felt to be cardiac in nature

## 2020-07-13 NOTE — Assessment & Plan Note (Signed)
Chronic Blood pressure well controlled Continue benazepril 20 mg daily, hydrochlorothiazide 12.5 mg daily and metoprolol 100 mg twice daily CMP

## 2020-07-13 NOTE — Assessment & Plan Note (Signed)
Chronic Check a1c Low sugar / carb diet 

## 2020-07-13 NOTE — Assessment & Plan Note (Signed)
Chronic-worse recently Not cardiac-has undergone cardiac evaluation Occasional wheeze and cough She is concerned this may be related to her asthma-lungs clear on exam.  Albuterol inhaler sent to pharmacy for her to try We do not have a higher dose of the Breo for her to try Sees pulmonary in 10 days Discussed with her that some of the shortness of breath could also be related to deconditioning

## 2020-07-13 NOTE — Assessment & Plan Note (Signed)
Chronic  Clinically euthyroid Currently taking levothyroxine 75 mcg daily Check tsh  Titrate med dose if needed  

## 2020-07-13 NOTE — Assessment & Plan Note (Signed)
Chronic She had some improvement with increasing cymbalta to 90 mg which we did earlier this monht - less crying fits, but has increased fatigue She will try taking it a little earlier

## 2020-07-13 NOTE — Assessment & Plan Note (Signed)
Chronic Check lipid panel  Continue atorvastatin 20 mg daily Regular exercise and healthy diet encouraged  

## 2020-07-21 ENCOUNTER — Encounter: Payer: Medicare Other | Attending: Physical Medicine & Rehabilitation | Admitting: Registered Nurse

## 2020-07-21 ENCOUNTER — Other Ambulatory Visit: Payer: Self-pay

## 2020-07-21 ENCOUNTER — Encounter: Payer: Self-pay | Admitting: Registered Nurse

## 2020-07-21 VITALS — BP 128/84 | HR 74 | Temp 97.9°F | Ht 60.0 in | Wt 163.4 lb

## 2020-07-21 DIAGNOSIS — G894 Chronic pain syndrome: Secondary | ICD-10-CM | POA: Diagnosis not present

## 2020-07-21 DIAGNOSIS — Z5181 Encounter for therapeutic drug level monitoring: Secondary | ICD-10-CM | POA: Insufficient documentation

## 2020-07-21 DIAGNOSIS — M24551 Contracture, right hip: Secondary | ICD-10-CM | POA: Diagnosis not present

## 2020-07-21 DIAGNOSIS — M419 Scoliosis, unspecified: Secondary | ICD-10-CM | POA: Diagnosis not present

## 2020-07-21 DIAGNOSIS — M961 Postlaminectomy syndrome, not elsewhere classified: Secondary | ICD-10-CM | POA: Insufficient documentation

## 2020-07-21 DIAGNOSIS — Z79891 Long term (current) use of opiate analgesic: Secondary | ICD-10-CM | POA: Diagnosis not present

## 2020-07-21 DIAGNOSIS — M48062 Spinal stenosis, lumbar region with neurogenic claudication: Secondary | ICD-10-CM | POA: Diagnosis not present

## 2020-07-21 MED ORDER — HYDROCODONE-ACETAMINOPHEN 10-325 MG PO TABS: 1.0000 | ORAL_TABLET | Freq: Three times a day (TID) | ORAL | 0 refills | Status: DC | PRN

## 2020-07-21 NOTE — Patient Instructions (Addendum)
My- Chart:   [phone]  

## 2020-07-21 NOTE — Progress Notes (Signed)
Subjective:    Patient ID: Hannah Scott, female    DOB: 10/22/1950, 69 y.o.   MRN: [id number]  HPI: Hannah Scott is a 70 y.o. female who returns for follow up appointment for chronic pain and medication refill. She states her  pain is located in her lower back. She  rates her pain 5. Her  current exercise regime is walking and performing stretching exercises.  Hannah Scott Morphine equivalent is 24.44 MME.  Last Oral Swab was Performed on 06/12/2020, it was consistent.    Pain Inventory Average Pain 6 Pain Right Now 5 My pain is constant, burning, tingling and aching  In the last 24 hours, has pain interfered with the following? General activity 10 Relation with others 10 Enjoyment of life  What TIME of day is your pain at its worst? morning , daytime, evening and night Sleep (in general) Poor  Pain is worse with: walking and standing Pain improves with: rest and medication Relief from Meds: 6  Family History  Problem Relation Age of Onset  . Heart failure Mother   . Pneumonia Mother   . Hypertension Mother   . Heart disease Mother   . Stroke Maternal Grandfather   . Hypertension Maternal Grandfather   . Heart attack Father   . Heart disease Father   . Asthma Paternal Uncle        PAT UNCLES  . Heart attack Paternal Uncle    Social History   Socioeconomic History  . Marital status: Widowed    Spouse name: Not on file  . Number of children: Not on file  . Years of education: Not on file  . Highest education level: Not on file  Occupational History  . Not on file  Tobacco Use  . Smoking status: Never Smoker  . Smokeless tobacco: Never Used  Substance and Sexual Activity  . Alcohol use: No    Alcohol/week: 0.0 standard drinks  . Drug use: No  . Sexual activity: Never    Birth control/protection: Surgical  Other Topics Concern  . Not on file  Social History Narrative  . Not on file   Social Determinants of Health   Financial Resource Strain: Low Risk   .  Difficulty of Paying Living Expenses: Not very hard  Food Insecurity: Not on file  Transportation Needs: No Transportation Needs  . Lack of Transportation (Medical): No  . Lack of Transportation (Non-Medical): No  Physical Activity: Not on file  Stress: Not on file  Social Connections: Not on file   Past Surgical History:  Procedure Laterality Date  . ANKLE SURGERY Left    ligament  . APPENDECTOMY  1987   AT [facility]  . BACK SURGERY     Fusion  . BREAST LUMPECTOMY  2003   radiation on right  . BREAST LUMPECTOMY WITH RADIOACTIVE SEED LOCALIZATION Left 02/12/2016   Procedure: LEFT BREAST LUMPECTOMY WITH RADIOACTIVE SEED LOCALIZATION;  Surgeon: Excell Seltzer, MD;  Location: [place];  Service: General;  Laterality: Left;  . CARDIOVASCULAR STRESS TEST  09/07/05   Nuclear, was negative  . CATARACT EXTRACTION Bilateral 01,03  . OOPHORECTOMY     LSO -RSO  . PELVIC LAPAROSCOPY  1989   RSO, LYSIS OF ADHESIONS  . TOTAL ABDOMINAL HYSTERECTOMY  1987   LSO, APPENDECTOMY  . TOTAL HIP ARTHROPLASTY Right 10/28/2013   dr Lorin Mercy  . TOTAL HIP ARTHROPLASTY Right 10/28/2013   Procedure: TOTAL HIP ARTHROPLASTY ANTERIOR APPROACH;  Surgeon: Marybelle Killings,  MD;  Location: [place];  Service: Orthopedics;  Laterality: Right;  Right Total Hip Arthroplasty, Direct Anterior Approach   Past Surgical History:  Procedure Laterality Date  . ANKLE SURGERY Left    ligament  . APPENDECTOMY  1987   AT [facility]  . BACK SURGERY     Fusion  . BREAST LUMPECTOMY  2003   radiation on right  . BREAST LUMPECTOMY WITH RADIOACTIVE SEED LOCALIZATION Left 02/12/2016   Procedure: LEFT BREAST LUMPECTOMY WITH RADIOACTIVE SEED LOCALIZATION;  Surgeon: Excell Seltzer, MD;  Location: [place];  Service: General;  Laterality: Left;  . CARDIOVASCULAR STRESS TEST  09/07/05   Nuclear, was negative  . CATARACT EXTRACTION Bilateral 01,03  . OOPHORECTOMY     LSO -RSO  . PELVIC LAPAROSCOPY  1989   RSO,  LYSIS OF ADHESIONS  . TOTAL ABDOMINAL HYSTERECTOMY  1987   LSO, APPENDECTOMY  . TOTAL HIP ARTHROPLASTY Right 10/28/2013   dr Lorin Mercy  . TOTAL HIP ARTHROPLASTY Right 10/28/2013   Procedure: TOTAL HIP ARTHROPLASTY ANTERIOR APPROACH;  Surgeon: Marybelle Killings, MD;  Location: [place];  Service: Orthopedics;  Laterality: Right;  Right Total Hip Arthroplasty, Direct Anterior Approach   Past Medical History:  Diagnosis Date  . Anemia    hx  . Arthritis   . Asthma    PFTs, February, 2011, moderate obstructive disease with response to bronchodilators, normal lung volumes, moderate reduction in diffusing capacity  . Atrial septal aneurysm    Echo, 2008-not noted on 13 echo  . CAD (coronary artery disease)    90% distal LAD in the past  /   nuclear, 2008, no ischemia, ejection fraction 70%  . Cancer ([place]) 2002   DUCTAL CIS--S/P LUMPECTOMY, RADIATION AND 6 WEEKS OF TAMOXIFEN  . D-dimer, elevated    January, 2014  . Depression   . Ejection fraction    EF 60%, echo, October, 2008  . Elevated CPK    January, 2014  . Endometriosis 1989   RIGHT TUBE  . Endometriosis 1987   LEFT TUBE/OVARY W FOCAL IN-SITU ENDOMETRIAL ADENOCARCINOMA  . GERD (gastroesophageal reflux disease)    occ  . H/O hiatal hernia    ?  [name] Hyperlipidemia   . Hypertension   . Hypothyroidism    Patient has had in the past that she does not need treatment  . Kyphoscoliosis   . Obstructive airway disease ([place])   . Pinched nerve    lower back  . Shortness of breath    Echo 2/22: EF 60-65, no RWMA, mild LVH, GR 1  DD, GLS -24%, normal RVSF, mild MR, trivial AI, borderline dilation of aortic root (39 mm)  . UTI (lower urinary tract infection)    BP 128/84   Pulse 74   Temp 97.9 F (36.6 C)   Ht 5' (1.524 m)   Wt 163 lb 6.4 oz (74.1 kg)   SpO2 (!) 89%   BMI 31.91 kg/m   Opioid Risk Score:   Fall Risk Score:  `1  Depression screen PHQ 2/9  Depression screen [facility] 2/9 07/21/2020 07/21/2020 04/03/2020 12/06/2019 11/08/2019  10/11/2019 09/11/2019  Decreased Interest 1 0 3 3 3 3  0  Down, Depressed, Hopeless 1 - 3 2 2 3 1   PHQ - 2 Score 2 0 6 5 5 6 1   Altered sleeping - - - - - 3 -  Tired, decreased energy - - - - - 3 -  Change in appetite - - - - - 3 -  Feeling bad or failure about yourself  - - - - - 0 -  Trouble concentrating - - - - - 2 -  Moving slowly or fidgety/restless - - - - - 0 -  Suicidal thoughts - - - - - 0 -  PHQ-9 Score - - - - - 17 -  Difficult doing work/chores - - - - - - -  Some recent data might be hidden      Review of Systems  Constitutional: Negative.   HENT: Negative.   Eyes: Negative.   Respiratory: Negative.   Cardiovascular: Negative.   Gastrointestinal: Negative.   Endocrine: Negative.   Genitourinary: Negative.   Musculoskeletal: Positive for back pain.  Skin: Negative.   Allergic/Immunologic: Negative.   Neurological: Negative.   Hematological: Negative.   Psychiatric/Behavioral: Negative.        Objective:   Physical Exam Vitals and nursing note reviewed.  Constitutional:      Appearance: Normal appearance.  Cardiovascular:     Rate and Rhythm: Normal rate and regular rhythm.     Pulses: Normal pulses.     Heart sounds: Normal heart sounds.  Pulmonary:     Effort: Pulmonary effort is normal.     Breath sounds: Normal breath sounds.  Musculoskeletal:     Cervical back: Normal range of motion and neck supple.     Comments: Normal Muscle Bulk and Muscle Testing Reveals:  Upper Extremities: Full ROM and Muscle Strength 5/5  Lumbar Paraspinal Tenderness: L-3-L-5 Lower Extremities: Full ROM and Muscle Strength 5/5 Arises from Table slowly using walker for support Narrow Based  Gait   Skin:    General: Skin is warm and dry.  Neurological:     Mental Status: She is alert and oriented to person, place, and time.  Psychiatric:        Mood and Affect: Mood normal.        Behavior: Behavior normal.           Assessment & Plan:  1.Lumbar post  laminectomy syndromewith severe kyphoscoliosis thoracolumbar spine, s/p lumbar fusion/ 07/21/2020 Continue current medication regimen.Refilled: Hydrocodone 10/325mg  one tablet every8hours may take an extra tablet when pain is severe#100. We will continue the opioid monitoring program, this consists of regular clinic visits, examinations, urine drug screen, pill counts as well as use of [place] Controlled Substance Reporting system. A 12 month History has been reviewed on the [place] Controlled Substance Reporting Systemon03/29/2022 2. Right Hip OA: S/P Right Hip Replacement 10/28/2013.07/21/2020 3.Depression: Continue Cymbalta. Has Family Support and Friends. Counseling with Doristine Bosworth.07/21/2020. 4.RightGreater Trochanteric Tenderness:No complaints today.Continue to Monitor. Continue with Ice and Heat Therapy.07/21/2020  F/U in 1 month

## 2020-07-23 ENCOUNTER — Institutional Professional Consult (permissible substitution): Payer: Medicare Other | Admitting: Internal Medicine

## 2020-07-31 NOTE — Progress Notes (Signed)
Cardiology Office Note:    Date:  08/06/2020   ID:  Hannah Scott, DOB 1950-06-11, MRN [id number]  PCP:  Binnie Rail, MD   [facility]  Cardiologist:  Ena Dawley, MD  Advanced Practice Provider:  No care team member to display Electrophysiologist:  None   Referring MD: Binnie Rail, MD    History of Present Illness:    Hannah Scott is a 70 y.o. female with a hx of prior MI (1991, 49) secondary to coronary vasospasm, known distal LAD disease, HTN, HLD, atrial septal aneurysm, and asthma who was previously followed by Dr. Meda Coffee who presents to clinic for follow-up.  Saw Richardson Dopp on 06/05/20 where she was continuing to have shortness of breath with minimal activity over the past several months as well as chest/neck discomfort. Concerned for angina given history of vasospasm and known distal LAD disease in th past. Myoview 12/2019 with no ischemia; low risk. BNP on that visit 255.   Saw me on 06/19/20 where she continued to have SOB. TTE reassuringly normal with LVEF 60-65%, no WMA, no significant valve disease, RAP 3. Her amlodipine was stopped due to episodes of hypotension.  Today, the patient states the shortness of breath was initially better but has worsened since the pollens have gotten worse. Has been using an inhaler which has helped significantly. Has follow-up with Pulm next week.  Otherwise, blood pressures have been low at home with several episodes of 90/50s where she feels lightheaded. She has been off the amlodipine so the symptoms are less frequent than previously. No syncope but can feel very tired at the end of the day. She is also frustrated about not losing weight but admits that it is hard to cook since she lost her husband. She is motivated to try to eat healthier but declined referral to weight loss clinic at this time.  Past Medical History:  Diagnosis Date  . Anemia    hx  . Arthritis   . Asthma    PFTs, February, 2011,  moderate obstructive disease with response to bronchodilators, normal lung volumes, moderate reduction in diffusing capacity  . Atrial septal aneurysm    Echo, 2008-not noted on 13 echo  . CAD (coronary artery disease)    90% distal LAD in the past  /   nuclear, 2008, no ischemia, ejection fraction 70%  . Cancer ([place]) 2002   DUCTAL CIS--S/P LUMPECTOMY, RADIATION AND 6 WEEKS OF TAMOXIFEN  . D-dimer, elevated    January, 2014  . Depression   . Ejection fraction    EF 60%, echo, October, 2008  . Elevated CPK    January, 2014  . Endometriosis 1989   RIGHT TUBE  . Endometriosis 1987   LEFT TUBE/OVARY W FOCAL IN-SITU ENDOMETRIAL ADENOCARCINOMA  . GERD (gastroesophageal reflux disease)    occ  . H/O hiatal hernia    ?  [name] Hyperlipidemia   . Hypertension   . Hypothyroidism    Patient has had in the past that she does not need treatment  . Kyphoscoliosis   . Obstructive airway disease ([place])   . Pinched nerve    lower back  . Shortness of breath    Echo 2/22: EF 60-65, no RWMA, mild LVH, GR 1  DD, GLS -24%, normal RVSF, mild MR, trivial AI, borderline dilation of aortic root (39 mm)  . UTI (lower urinary tract infection)     Past Surgical History:  Procedure Laterality Date  .  ANKLE SURGERY Left    ligament  . APPENDECTOMY  1987   AT [facility]  . BACK SURGERY     Fusion  . BREAST LUMPECTOMY  2003   radiation on right  . BREAST LUMPECTOMY WITH RADIOACTIVE SEED LOCALIZATION Left 02/12/2016   Procedure: LEFT BREAST LUMPECTOMY WITH RADIOACTIVE SEED LOCALIZATION;  Surgeon: Excell Seltzer, MD;  Location: [place];  Service: General;  Laterality: Left;  . CARDIOVASCULAR STRESS TEST  09/07/05   Nuclear, was negative  . CATARACT EXTRACTION Bilateral 01,03  . OOPHORECTOMY     LSO -RSO  . PELVIC LAPAROSCOPY  1989   RSO, LYSIS OF ADHESIONS  . TOTAL ABDOMINAL HYSTERECTOMY  1987   LSO, APPENDECTOMY  . TOTAL HIP ARTHROPLASTY Right 10/28/2013   dr Lorin Mercy  . TOTAL HIP  ARTHROPLASTY Right 10/28/2013   Procedure: TOTAL HIP ARTHROPLASTY ANTERIOR APPROACH;  Surgeon: Marybelle Killings, MD;  Location: [facility];  Service: Orthopedics;  Laterality: Right;  Right Total Hip Arthroplasty, Direct Anterior Approach    Current Medications: Current Meds  Medication Sig  . albuterol (VENTOLIN HFA) 108 (90 Base) MCG/ACT inhaler Inhale 2 puffs into the lungs every 4 (four) hours as needed for wheezing or shortness of breath.  [name] aspirin EC 81 MG tablet Take 1 tablet (81 mg total) by mouth daily. Swallow whole.  [name] atorvastatin (LIPITOR) 20 MG tablet Take 1 tablet (20 mg total) by mouth daily.  . benazepril (LOTENSIN) 20 MG tablet Take 1 tablet (20 mg total) by mouth daily.  [name] BREO ELLIPTA 100-25 MCG/INH AEPB INHALE 1 PUFF INTO THE LUNGS DAILY  . DULoxetine (CYMBALTA) 30 MG capsule Take 1 capsule (30 mg total) by mouth daily. Take in addition to 60 mg for a total of 90 mg daily  . DULoxetine (CYMBALTA) 60 MG capsule TAKE ONE CAPSULE BY MOUTH DAILY  . gabapentin (NEURONTIN) 600 MG tablet TAKE ONE TABLET EVERY DAY AT BEDTIME  . ipratropium-albuterol (DUONEB) 0.5-2.5 (3) MG/3ML SOLN Take 3 mLs by nebulization every 4 (four) hours as needed (wheezing or SOB). During exacerbation  . levothyroxine (SYNTHROID) 75 MCG tablet Take 1 tablet (75 mcg total) by mouth daily.  . metoprolol tartrate (LOPRESSOR) 50 MG tablet TAKE 2 TABLETS BY MOUTH TWICE DAILY  . [DISCONTINUED] hydrochlorothiazide (HYDRODIURIL) 25 MG tablet Take 0.5 tablets (12.5 mg total) by mouth daily.  . [DISCONTINUED] HYDROcodone-acetaminophen (NORCO) 10-325 MG tablet Take 1 tablet by mouth every 8 (eight) hours as needed.     Allergies:   Fluticasone-salmeterol, Norvasc [amlodipine besylate], and Tape   Social History   Socioeconomic History  . Marital status: Widowed    Spouse name: Not on file  . Number of children: Not on file  . Years of education: Not on file  . Highest education level: Not on file  Occupational  History  . Not on file  Tobacco Use  . Smoking status: Never Smoker  . Smokeless tobacco: Never Used  Substance and Sexual Activity  . Alcohol use: No    Alcohol/week: 0.0 standard drinks  . Drug use: No  . Sexual activity: Never    Birth control/protection: Surgical  Other Topics Concern  . Not on file  Social History Narrative  . Not on file   Social Determinants of Health   Financial Resource Strain: Low Risk   . Difficulty of Paying Living Expenses: Not very hard  Food Insecurity: Not on file  Transportation Needs: No Transportation Needs  . Lack of Transportation (Medical): No  .  Lack of Transportation (Non-Medical): No  Physical Activity: Not on file  Stress: Not on file  Social Connections: Not on file     Family History: The patient's  family history includes Asthma in her paternal uncle; Heart attack in her father and paternal uncle; Heart disease in her father and mother; Heart failure in her mother; Hypertension in her maternal grandfather and mother; Pneumonia in her mother; Stroke in her maternal grandfather.  ROS:   Please see the history of present illness.    Review of Systems  Constitutional: Positive for malaise/fatigue. Negative for fever and weight loss.  HENT: Negative for hearing loss.   Eyes: Negative for blurred vision and redness.  Respiratory: Positive for shortness of breath.   Cardiovascular: Negative for chest pain, palpitations, orthopnea, claudication, leg swelling and PND.  Gastrointestinal: Negative for melena, nausea and vomiting.  Genitourinary: Negative for dysuria and flank pain.  Musculoskeletal: Positive for joint pain.  Neurological: Positive for dizziness. Negative for loss of consciousness.  Endo/Heme/Allergies: Negative for polydipsia.  Psychiatric/Behavioral: Positive for depression.    EKGs/Labs/Other Studies Reviewed:    The following studies were reviewed today: TTE 2019-06-27: IMPRESSIONS  1. Left ventricular  ejection fraction, by estimation, is 60 to 65%. The  left ventricle has normal function. The left ventricle has no regional  wall motion abnormalities. There is mild left ventricular hypertrophy.  Left ventricular diastolic parameters  are consistent with Grade I diastolic dysfunction (impaired relaxation).  The average left ventricular global longitudinal strain is -24.0 %. The  global longitudinal strain is normal.  2. Right ventricular systolic function is normal. The right ventricular  size is normal. Tricuspid regurgitation signal is inadequate for assessing  PA pressure.  3. The mitral valve is normal in structure. Mild mitral valve  regurgitation. No evidence of mitral stenosis.  4. The aortic valve was not well visualized. Aortic valve regurgitation  is trivial. No aortic stenosis is present.  5. There is borderline dilatation of the aortic root, measuring 39 mm.  6. The inferior vena cava is normal in size with greater than 50%  respiratory variability, suggesting right atrial pressure of 3 mmHg.   TTE 2019: Study Conclusions   - Left ventricle: The cavity size was normal. Wall thickness was  increased in a pattern of moderate LVH. Systolic function was  normal. The estimated ejection fraction was in the range of 55%  to 60%. Wall motion was normal; there were no regional wall  motion abnormalities. Doppler parameters are consistent with  abnormal left ventricular relaxation (grade 1 diastolic  dysfunction).  - Aortic valve: There was trivial regurgitation.  - Ascending aorta: The ascending aorta was mildly dilated.  - Mitral valve: Calcified annulus. Mildly thickened leaflets .   Impressions:   - Normal LV systolic function; moderate LVH; mild diastolic  dysfunction; trace AI; mildly dilated ascending aorta.   Myoview 12/2019:  The left ventricular ejection fraction is normal (55-65%).  Nuclear stress EF: 64%. No wall motion  abnormalities  There was no ST segment deviation noted during stress.  The study is normal.  This is a low risk study. There is no infarct or ischemia identified.    Recent Labs: 06/05/2020: Hemoglobin 14.1; NT-Pro BNP 255; Platelets 267 07/13/2020: ALT 12; BUN 14; Creatinine, Ser 0.78; Potassium 4.8; Sodium 138; TSH 3.91  Recent Lipid Panel    Component Value Date/Time   CHOL 138 07/13/2020 1506   TRIG 77.0 07/13/2020 1506   HDL 50.80 07/13/2020 1506  CHOLHDL 3 07/13/2020 1506   VLDL 15.4 07/13/2020 1506   LDLCALC 72 07/13/2020 1506   LDLCALC 61 12/13/2019 1430   LDLDIRECT 154.8 02/20/2009 1116     Risk Assessment/Calculations:       Physical Exam:    VS:  BP 122/72   Pulse 82   Ht 5' (1.524 m)   Wt 164 lb 9.6 oz (74.7 kg)   SpO2 92%   BMI 32.15 kg/m     Wt Readings from Last 3 Encounters:  08/06/20 164 lb 9.6 oz (74.7 kg)  07/21/20 163 lb 6.4 oz (74.1 kg)  07/13/20 163 lb (73.9 kg)     GEN:  Comfortable, NAD HEENT: Normal NECK: No JVD; No carotid bruits. Scoliosis present CARDIAC: RR, 2/6 systolic murmur RESPIRATORY:  Clear to auscultation without rales, wheezing or rhonchi  ABDOMEN: Soft, non-tender, non-distended MUSCULOSKELETAL:  No edema; No deformity  SKIN: Warm and dry NEUROLOGIC:  Alert and oriented x 3 PSYCHIATRIC:  Normal affect   ASSESSMENT:    1. Shortness of breath   2. Intermittent asthma without complication, unspecified asthma severity   3. Essential hypertension   4. Coronary artery disease involving native coronary artery of native heart without angina pectoris   5. Mixed hyperlipidemia   6. Coronary artery disease involving native coronary artery of native heart with angina pectoris (HCC)   7. Obesity (BMI 30.0-34.9)    PLAN:    In order of problems listed above:  #CAD with distal LAD #Vasospasm: Myoview 12/2019 with no evidence of ischemia. TTE with normal BiV function, no significant valvular abnormalities. Patient has  rare, intermittent neck tightness but this is non-exertional and only lasts a couple of seconds before abating. Was trialed on amlodipine for possible vasospasm, however, no significant change in symptoms and patient has noted more frequent episodes of hypotension. Given reassuring cardiac work-up, no further testing needed at this time.  -Cardiac work-up including myoview and TTE reassuringly normal -Continue ASA and statin -Continue metop 50mg  BID -Holding amlodipine due to episodes of hypotension at home  #DOE: #Asthma: Continues to have SOB on exertion that is unchanged from prior visit. Cardiac work-up including BNP, TTE and myoview reassuringly normal. Suspect symptoms may be related to underlying asthma as often flares during this time of year. Has not followed with a pulmonologist for years and is interested in establishing care. -Referred to pulm--follow-up next week  -Cardiac work-up including TTE, BNP and myoview reassuring -Continue inhalers  #HTN: Soft blood pressures today with episodes of dizziness at home. -Stopped amlodipine given episodes of hypotension with dizziness at home -Stop HCTZ now -Continue benzapril 10mg  daily -Continue metop 50mg  BID -Monitor blood pressures at home  #HLD: LDL 61. -Continue atorvastatin 20mg  daily  #Obesity: BMI 32. Patient is functionally limited but will try to work on diet more. Hard to cook since the loss of her husband. Discussed mediterranean diet today. -Continue lifestyle modifications -Declined referral to weight loss clinic    Medication Adjustments/Labs and Tests Ordered: Current medicines are reviewed at length with the patient today.  Concerns regarding medicines are outlined above.  No orders of the defined types were placed in this encounter.  No orders of the defined types were placed in this encounter.   Patient Instructions  Medication Instructions:  STOP HYDROCHLOROTHIAZIDE  *If you need a refill on your  cardiac medications before your next appointment, please call your pharmacy*   Lab Work: NONE If you have labs (blood work) drawn today and your tests  are completely normal, you will receive your results only by: [name] MyChart Message (if you have MyChart) OR . A paper copy in the mail If you have any lab test that is abnormal or we need to change your treatment, we will call you to review the results.   Testing/Procedures: NONE   Follow-Up: At [facility], you and your health needs are our priority.  As part of our continuing mission to provide you with exceptional heart care, we have created designated Provider Care Teams.  These Care Teams include your primary Cardiologist (physician) and Advanced Practice Providers (APPs -  Physician Assistants and Nurse Practitioners) who all work together to provide you with the care you need, when you need it.  We recommend signing up for the patient portal called "MyChart".  Sign up information is provided on this After Visit Summary.  MyChart is used to connect with patients for Virtual Visits (Telemedicine).  Patients are able to view lab/test results, encounter notes, upcoming appointments, etc.  Non-urgent messages can be sent to your provider as well.   To learn more about what you can do with MyChart, go to [email or web address].    Your next appointment:   3 month(s)  The format for your next appointment:   In Person  Provider:   Gwyndolyn Kaufman, MD   Other Instructions NONE     Signed, Freada Bergeron, MD  08/06/2020 11:17 AM    [place]

## 2020-08-03 ENCOUNTER — Other Ambulatory Visit: Payer: Self-pay | Admitting: Internal Medicine

## 2020-08-06 ENCOUNTER — Other Ambulatory Visit: Payer: Self-pay

## 2020-08-06 ENCOUNTER — Encounter: Payer: Self-pay | Admitting: Registered Nurse

## 2020-08-06 ENCOUNTER — Encounter: Payer: Self-pay | Admitting: Cardiology

## 2020-08-06 ENCOUNTER — Encounter: Payer: Medicare Other | Attending: Physical Medicine & Rehabilitation | Admitting: Registered Nurse

## 2020-08-06 ENCOUNTER — Ambulatory Visit: Payer: Medicare Other | Admitting: Cardiology

## 2020-08-06 VITALS — BP 122/72 | HR 82 | Ht 60.0 in | Wt 164.6 lb

## 2020-08-06 VITALS — BP 91/60 | HR 70 | Temp 98.0°F | Ht 60.0 in | Wt 164.6 lb

## 2020-08-06 DIAGNOSIS — Z79891 Long term (current) use of opiate analgesic: Secondary | ICD-10-CM | POA: Diagnosis not present

## 2020-08-06 DIAGNOSIS — R0602 Shortness of breath: Secondary | ICD-10-CM | POA: Diagnosis not present

## 2020-08-06 DIAGNOSIS — M48062 Spinal stenosis, lumbar region with neurogenic claudication: Secondary | ICD-10-CM | POA: Diagnosis not present

## 2020-08-06 DIAGNOSIS — M419 Scoliosis, unspecified: Secondary | ICD-10-CM | POA: Diagnosis not present

## 2020-08-06 DIAGNOSIS — I10 Essential (primary) hypertension: Secondary | ICD-10-CM | POA: Diagnosis not present

## 2020-08-06 DIAGNOSIS — I2510 Atherosclerotic heart disease of native coronary artery without angina pectoris: Secondary | ICD-10-CM | POA: Diagnosis not present

## 2020-08-06 DIAGNOSIS — G894 Chronic pain syndrome: Secondary | ICD-10-CM | POA: Insufficient documentation

## 2020-08-06 DIAGNOSIS — J4520 Mild intermittent asthma, uncomplicated: Secondary | ICD-10-CM

## 2020-08-06 DIAGNOSIS — I25119 Atherosclerotic heart disease of native coronary artery with unspecified angina pectoris: Secondary | ICD-10-CM

## 2020-08-06 DIAGNOSIS — Z5181 Encounter for therapeutic drug level monitoring: Secondary | ICD-10-CM | POA: Insufficient documentation

## 2020-08-06 DIAGNOSIS — M961 Postlaminectomy syndrome, not elsewhere classified: Secondary | ICD-10-CM | POA: Diagnosis not present

## 2020-08-06 DIAGNOSIS — E66811 Obesity, class 1: Secondary | ICD-10-CM

## 2020-08-06 DIAGNOSIS — M24551 Contracture, right hip: Secondary | ICD-10-CM | POA: Diagnosis not present

## 2020-08-06 DIAGNOSIS — E782 Mixed hyperlipidemia: Secondary | ICD-10-CM

## 2020-08-06 DIAGNOSIS — E669 Obesity, unspecified: Secondary | ICD-10-CM

## 2020-08-06 MED ORDER — HYDROCODONE-ACETAMINOPHEN 10-325 MG PO TABS: 1.0000 | ORAL_TABLET | Freq: Three times a day (TID) | ORAL | 0 refills | Status: DC | PRN

## 2020-08-06 NOTE — Patient Instructions (Addendum)
Medication Instructions:  STOP HYDROCHLOROTHIAZIDE  *If you need a refill on your cardiac medications before your next appointment, please call your pharmacy*   Lab Work: NONE If you have labs (blood work) drawn today and your tests are completely normal, you will receive your results only by: [name] MyChart Message (if you have MyChart) OR . A paper copy in the mail If you have any lab test that is abnormal or we need to change your treatment, we will call you to review the results.   Testing/Procedures: NONE   Follow-Up: At [facility], you and your health needs are our priority.  As part of our continuing mission to provide you with exceptional heart care, we have created designated Provider Care Teams.  These Care Teams include your primary Cardiologist (physician) and Advanced Practice Providers (APPs -  Physician Assistants and Nurse Practitioners) who all work together to provide you with the care you need, when you need it.  We recommend signing up for the patient portal called "MyChart".  Sign up information is provided on this After Visit Summary.  MyChart is used to connect with patients for Virtual Visits (Telemedicine).  Patients are able to view lab/test results, encounter notes, upcoming appointments, etc.  Non-urgent messages can be sent to your provider as well.   To learn more about what you can do with MyChart, go to [email or web address].    Your next appointment:   3 month(s)  The format for your next appointment:   In Person  Provider:   Gwyndolyn Kaufman, MD   Other Instructions NONE

## 2020-08-06 NOTE — Progress Notes (Signed)
Subjective:    Patient ID: Hannah Scott, female    DOB: 03/05/1951, 70 y.o.   MRN: [id number]  HPI: Hannah Scott is a 70 y.o. female who returns for follow up appointment for chronic pain and medication refill. She states her pain is located in her lower back.. She rates her pain 7. Her current exercise regime is walking and performing stretching exercises.  Hannah Scott Morphine equivalent is 33.33MME.  Last Oral Swab was Performed on 06/12/2020, it was consistent.    Pain Inventory Average Pain 7 Pain Right Now 7 My pain is constant, burning, tingling and aching  In the last 24 hours, has pain interfered with the following? General activity 10 Relation with others 10 Enjoyment of life 10 What TIME of day is your pain at its worst? morning , daytime, evening, night and varies Sleep (in general) Poor  Pain is worse with: walking and standing Pain improves with: rest and medication Relief from Meds: 4  Family History  Problem Relation Age of Onset  . Heart failure Mother   . Pneumonia Mother   . Hypertension Mother   . Heart disease Mother   . Stroke Maternal Grandfather   . Hypertension Maternal Grandfather   . Heart attack Father   . Heart disease Father   . Asthma Paternal Uncle        PAT UNCLES  . Heart attack Paternal Uncle    Social History   Socioeconomic History  . Marital status: Widowed    Spouse name: Not on file  . Number of children: Not on file  . Years of education: Not on file  . Highest education level: Not on file  Occupational History  . Not on file  Tobacco Use  . Smoking status: Never Smoker  . Smokeless tobacco: Never Used  Substance and Sexual Activity  . Alcohol use: No    Alcohol/week: 0.0 standard drinks  . Drug use: No  . Sexual activity: Never    Birth control/protection: Surgical  Other Topics Concern  . Not on file  Social History Narrative  . Not on file   Social Determinants of Health   Financial Resource Strain: Low Risk    . Difficulty of Paying Living Expenses: Not very hard  Food Insecurity: Not on file  Transportation Needs: No Transportation Needs  . Lack of Transportation (Medical): No  . Lack of Transportation (Non-Medical): No  Physical Activity: Not on file  Stress: Not on file  Social Connections: Not on file   Past Surgical History:  Procedure Laterality Date  . ANKLE SURGERY Left    ligament  . APPENDECTOMY  1987   AT [facility]  . BACK SURGERY     Fusion  . BREAST LUMPECTOMY  2003   radiation on right  . BREAST LUMPECTOMY WITH RADIOACTIVE SEED LOCALIZATION Left 02/12/2016   Procedure: LEFT BREAST LUMPECTOMY WITH RADIOACTIVE SEED LOCALIZATION;  Surgeon: Excell Seltzer, MD;  Location: [place];  Service: General;  Laterality: Left;  . CARDIOVASCULAR STRESS TEST  09/07/05   Nuclear, was negative  . CATARACT EXTRACTION Bilateral 01,03  . OOPHORECTOMY     LSO -RSO  . PELVIC LAPAROSCOPY  1989   RSO, LYSIS OF ADHESIONS  . TOTAL ABDOMINAL HYSTERECTOMY  1987   LSO, APPENDECTOMY  . TOTAL HIP ARTHROPLASTY Right 10/28/2013   dr Lorin Mercy  . TOTAL HIP ARTHROPLASTY Right 10/28/2013   Procedure: TOTAL HIP ARTHROPLASTY ANTERIOR APPROACH;  Surgeon: Marybelle Killings, MD;  Location:  [facility];  Service: Orthopedics;  Laterality: Right;  Right Total Hip Arthroplasty, Direct Anterior Approach   Past Surgical History:  Procedure Laterality Date  . ANKLE SURGERY Left    ligament  . APPENDECTOMY  1987   AT [facility]  . BACK SURGERY     Fusion  . BREAST LUMPECTOMY  2003   radiation on right  . BREAST LUMPECTOMY WITH RADIOACTIVE SEED LOCALIZATION Left 02/12/2016   Procedure: LEFT BREAST LUMPECTOMY WITH RADIOACTIVE SEED LOCALIZATION;  Surgeon: Excell Seltzer, MD;  Location: [place];  Service: General;  Laterality: Left;  . CARDIOVASCULAR STRESS TEST  09/07/05   Nuclear, was negative  . CATARACT EXTRACTION Bilateral 01,03  . OOPHORECTOMY     LSO -RSO  . PELVIC LAPAROSCOPY  1989   RSO,  LYSIS OF ADHESIONS  . TOTAL ABDOMINAL HYSTERECTOMY  1987   LSO, APPENDECTOMY  . TOTAL HIP ARTHROPLASTY Right 10/28/2013   dr Lorin Mercy  . TOTAL HIP ARTHROPLASTY Right 10/28/2013   Procedure: TOTAL HIP ARTHROPLASTY ANTERIOR APPROACH;  Surgeon: Marybelle Killings, MD;  Location: [place];  Service: Orthopedics;  Laterality: Right;  Right Total Hip Arthroplasty, Direct Anterior Approach   Past Medical History:  Diagnosis Date  . Anemia    hx  . Arthritis   . Asthma    PFTs, February, 2011, moderate obstructive disease with response to bronchodilators, normal lung volumes, moderate reduction in diffusing capacity  . Atrial septal aneurysm    Echo, 2008-not noted on 13 echo  . CAD (coronary artery disease)    90% distal LAD in the past  /   nuclear, 2008, no ischemia, ejection fraction 70%  . Cancer ([place]) 2002   DUCTAL CIS--S/P LUMPECTOMY, RADIATION AND 6 WEEKS OF TAMOXIFEN  . D-dimer, elevated    January, 2014  . Depression   . Ejection fraction    EF 60%, echo, October, 2008  . Elevated CPK    January, 2014  . Endometriosis 1989   RIGHT TUBE  . Endometriosis 1987   LEFT TUBE/OVARY W FOCAL IN-SITU ENDOMETRIAL ADENOCARCINOMA  . GERD (gastroesophageal reflux disease)    occ  . H/O hiatal hernia    ?  [name] Hyperlipidemia   . Hypertension   . Hypothyroidism    Patient has had in the past that she does not need treatment  . Kyphoscoliosis   . Obstructive airway disease ([place])   . Pinched nerve    lower back  . Shortness of breath    Echo 2/22: EF 60-65, no RWMA, mild LVH, GR 1  DD, GLS -24%, normal RVSF, mild MR, trivial AI, borderline dilation of aortic root (39 mm)  . UTI (lower urinary tract infection)    BP 91/60   Pulse 70   Temp 98 F (36.7 C)   Ht 5' (1.524 m)   Wt 164 lb 9.6 oz (74.7 kg)   SpO2 (!) 83%   BMI 32.15 kg/m   Opioid Risk Score:   Fall Risk Score:  `1  Depression screen PHQ 2/9  Depression screen [facility] 2/9 07/21/2020 07/21/2020 04/03/2020 12/06/2019 11/08/2019 10/11/2019  09/11/2019  Decreased Interest 1 0 3 3 3 3  0  Down, Depressed, Hopeless 1 - 3 2 2 3 1   PHQ - 2 Score 2 0 6 5 5 6 1   Altered sleeping - - - - - 3 -  Tired, decreased energy - - - - - 3 -  Change in appetite - - - - - 3 -  Feeling  bad or failure about yourself  - - - - - 0 -  Trouble concentrating - - - - - 2 -  Moving slowly or fidgety/restless - - - - - 0 -  Suicidal thoughts - - - - - 0 -  PHQ-9 Score - - - - - 17 -  Difficult doing work/chores - - - - - - -  Some recent data might be hidden      Review of Systems  Constitutional: Negative.   HENT: Negative.   Eyes: Negative.   Respiratory: Negative.   Endocrine: Negative.   Genitourinary: Negative.   Musculoskeletal: Positive for gait problem.       Back lower pain  Skin: Negative.   Allergic/Immunologic: Negative.   Hematological: Negative.   Psychiatric/Behavioral: Negative.        Objective:   Physical Exam Vitals and nursing note reviewed.  Constitutional:      Appearance: Normal appearance.  Cardiovascular:     Rate and Rhythm: Normal rate and regular rhythm.     Pulses: Normal pulses.     Heart sounds: Normal heart sounds.  Pulmonary:     Effort: Pulmonary effort is normal.     Breath sounds: Normal breath sounds.  Musculoskeletal:     Cervical back: Normal range of motion and neck supple.     Comments: Normal Muscle Bulk and Muscle Testing Reveals:  Upper Extremities: Full ROM and Muscle Strength 5/5 Lumbar Paraspinal Tenderness: L-3-L-5 Lower Extremities : Full ROM and Muscle Strength 5/5 Arises from Table Slowly using walker for support Narrow Based Gait   Skin:    General: Skin is warm and dry.  Neurological:     Mental Status: She is alert and oriented to person, place, and time.  Psychiatric:        Mood and Affect: Mood normal.        Behavior: Behavior normal.           Assessment & Plan:  1.Lumbar post laminectomy syndromewith severe kyphoscoliosis thoracolumbar spine, s/p lumbar  fusion/ 08/06/2020 Continue current medication regimen.Refilled: Hydrocodone 10/325mg  one tablet every8hours may take an extra tablet when pain is severe#100. We will continue the opioid monitoring program, this consists of regular clinic visits, examinations, urine drug screen, pill counts as well as use of [place] Controlled Substance Reporting system. A 12 month History has been reviewed on the [place] Controlled Substance Reporting Systemon04/14/2022 2. Right Hip OA: S/P Right Hip Replacement 10/28/2013.08/06/2020 3.Depression: Continue Cymbalta. Has Family Support and Friends. Counseling with Doristine Bosworth.08/06/2020. 4.RightGreater Trochanteric Tenderness:No complaints today.Continue to Monitor. Continue with Ice and Heat Therapy.08/06/2020  F/U in 1 month

## 2020-08-07 ENCOUNTER — Other Ambulatory Visit: Payer: Self-pay | Admitting: Internal Medicine

## 2020-08-17 DIAGNOSIS — H34832 Tributary (branch) retinal vein occlusion, left eye, with macular edema: Secondary | ICD-10-CM | POA: Diagnosis not present

## 2020-09-01 ENCOUNTER — Ambulatory Visit: Payer: Medicare Other | Admitting: Internal Medicine

## 2020-09-01 ENCOUNTER — Other Ambulatory Visit: Payer: Self-pay

## 2020-09-01 ENCOUNTER — Ambulatory Visit (INDEPENDENT_AMBULATORY_CARE_PROVIDER_SITE_OTHER): Payer: Medicare Other

## 2020-09-01 ENCOUNTER — Encounter: Payer: Self-pay | Admitting: Internal Medicine

## 2020-09-01 DIAGNOSIS — R0609 Other forms of dyspnea: Secondary | ICD-10-CM

## 2020-09-01 DIAGNOSIS — R0600 Dyspnea, unspecified: Secondary | ICD-10-CM

## 2020-09-01 DIAGNOSIS — J4530 Mild persistent asthma, uncomplicated: Secondary | ICD-10-CM | POA: Diagnosis not present

## 2020-09-01 DIAGNOSIS — I10 Essential (primary) hypertension: Secondary | ICD-10-CM | POA: Diagnosis not present

## 2020-09-01 DIAGNOSIS — R059 Cough, unspecified: Secondary | ICD-10-CM | POA: Diagnosis not present

## 2020-09-01 DIAGNOSIS — R0602 Shortness of breath: Secondary | ICD-10-CM | POA: Diagnosis not present

## 2020-09-01 DIAGNOSIS — K449 Diaphragmatic hernia without obstruction or gangrene: Secondary | ICD-10-CM | POA: Diagnosis not present

## 2020-09-01 MED ORDER — FAMOTIDINE 20 MG PO TABS: ORAL_TABLET | ORAL | 11 refills | Status: DC

## 2020-09-01 MED ORDER — IRBESARTAN 150 MG PO TABS: 150.0000 mg | ORAL_TABLET | Freq: Every day | ORAL | 11 refills | Status: DC

## 2020-09-01 MED ORDER — PREDNISONE 10 MG PO TABS: ORAL_TABLET | ORAL | 0 refills | Status: DC

## 2020-09-01 MED ORDER — PANTOPRAZOLE SODIUM 40 MG PO TBEC: 40.0000 mg | DELAYED_RELEASE_TABLET | Freq: Every day | ORAL | 2 refills | Status: DC

## 2020-09-01 MED ORDER — DULERA 100-5 MCG/ACT IN AERO: INHALATION_SPRAY | RESPIRATORY_TRACT | 11 refills | Status: DC

## 2020-09-01 NOTE — Patient Instructions (Addendum)
Stop lotensin and breo  Start ibesartan 150 mg one daily  - ok to take just one half if too strong  Prednisone 10 mg take  4 each am x 2 days,   2 each am x 2 days,  1 each am x 2 days and stop    Plan A = Automatic = Always=    Dulera 100  Take 2 puffs first thing in am and then another 2 puffs about 12 hours later.   Work on inhaler technique:  relax and gently blow all the way out then take a nice smooth deep breath back in, triggering the inhaler at same time you start breathing in.  Hold for up to 5 seconds if you can. Blow out thru nose. Rinse and gargle with water when done  If can't master symbicort go [facility] to [facility] one click daily      Plan B = Backup (to supplement plan A, not to replace it) Only use your albuterol inhaler as a rescue medication to be used if you can't catch your breath by resting or doing a relaxed purse lip breathing pattern.  - The less you use it, the better it will work when you need it. - Ok to use the inhaler up to 2 puffs  every 4 hours if you must but call for appointment if use goes up over your usual need - Don't leave home without it !!  (think of it like the spare tire for your car)   Plan C = Crisis (instead of Plan B but only if Plan B stops working) - only use your albuterol nebulizer if you first try Plan B and it fails to help > ok to use the nebulizer up to every 4 hours but if start needing it regularly call for immediate appointment    Pantoprazole (protonix) 40 mg   Take  30-60 min before first meal of the day and Pepcid (famotidine)  20 mg after supper until return to office - this is the best way to tell whether stomach acid is contributing to your problem.     GERD (REFLUX)  is an extremely common cause of respiratory symptoms just like yours , many times with no obvious heartburn at all.    It can be treated with medication, but also with lifestyle changes including elevation of the head of your bed (ideally with 6 -8inch blocks under  the headboard of your bed),  Smoking cessation, avoidance of late meals, excessive alcohol, and avoid fatty foods, chocolate, peppermint, colas, red wine, and acidic juices such as orange juice.  NO MINT OR MENTHOL PRODUCTS SO NO COUGH DROPS  USE SUGARLESS CANDY INSTEAD (Jolley ranchers or Stover's or Life Savers) or even ice chips will also do - the key is to swallow to prevent all throat clearing. NO OIL BASED VITAMINS - use powdered substitutes.  Avoid fish oil when coughing.   Please remember to go to the lab and x-ray department  for your tests - we will call you with the results when they are available.       Please schedule a follow up office visit in 4 weeks, sooner if needed

## 2020-09-01 NOTE — Progress Notes (Signed)
Hannah Scott, female    DOB: 02-Jun-1950, 70 y.o.   MRN: [id number]   Brief patient profile:  45 yowf never smoker with asthma as infant colds always went to chest with months no symptoms between flares then around 2012 started having spells bad enough to be admitted and requiring daily treatment since ? 2015 while on ACEi but having daily symptoms refractory to BREO so referred to pulmonary clinic 09/01/2020 by Dr Johney Frame.         History of Present Illness  09/01/2020  Pulmonary/ 1st office eval/Krzysztof Reichelt  Chief Complaint  Patient presents with  . Follow-up  . Consult    SOB with exertion, worse over the past 2-3 months. Using albuterol 3x a day. Not using duoneb. Likes breo   Dyspnea:  Can't walk a grocery store s struggle x 3 months Cough: esp in am congestion, sometimes gagging with bad reflux  Sleep: in recliner x years x 45 degrees due to back  SABA use: 3 x daily   No obvious day to day or daytime variability or assoc excess/ purulent sputum or mucus plugs or hemoptysis or cp or chest tightness, subjective wheeze or overt sinus or hb symptoms.   Sleeping  As above without nocturnal    exacerbation  of respiratory  c/o's or need for noct saba. Also denies any obvious fluctuation of symptoms with weather or environmental changes or other aggravating or alleviating factors except as outlined above   No unusual exposure hx or h/o childhood pna or knowledge of premature birth.  Current Allergies, Complete Past Medical History, Past Surgical History, Family History, and Social History were reviewed in [employer] record.  ROS  The following are not active complaints unless bolded Hoarseness, sore throat, dysphagia, dental problems, itching, sneezing,  nasal congestion or discharge of excess mucus or purulent secretions, ear ache,   fever, chills, sweats, unintended wt loss or wt gain, classically pleuritic or exertional cp,  orthopnea pnd or arm/hand swelling  or leg  swelling, presyncope, palpitations, abdominal pain, anorexia, nausea, vomiting, diarrhea  or change in bowel habits or change in bladder habits, change in stools or change in urine, dysuria, hematuria,  rash, arthralgias, visual complaints, headache, numbness, weakness or ataxia or problems with walking or coordination,  change in mood or  memory.           Past Medical History:  Diagnosis Date  . Anemia    hx  . Arthritis   . Asthma    PFTs, February, 2011, moderate obstructive disease with response to bronchodilators, normal lung volumes, moderate reduction in diffusing capacity  . Atrial septal aneurysm    Echo, 2008-not noted on 13 echo  . CAD (coronary artery disease)    90% distal LAD in the past  /   nuclear, 2008, no ischemia, ejection fraction 70%  . Cancer ([place]) 2002   DUCTAL CIS--S/P LUMPECTOMY, RADIATION AND 6 WEEKS OF TAMOXIFEN  . D-dimer, elevated    January, 2014  . Depression   . Ejection fraction    EF 60%, echo, October, 2008  . Elevated CPK    January, 2014  . Endometriosis 1989   RIGHT TUBE  . Endometriosis 1987   LEFT TUBE/OVARY W FOCAL IN-SITU ENDOMETRIAL ADENOCARCINOMA  . GERD (gastroesophageal reflux disease)    occ  . H/O hiatal hernia    ?  [name] Hyperlipidemia   . Hypertension   . Hypothyroidism    Patient has had in the past  that she does not need treatment  . Kyphoscoliosis   . Obstructive airway disease ([place])   . Pinched nerve    lower back  . Shortness of breath    Echo 2/22: EF 60-65, no RWMA, mild LVH, GR 1  DD, GLS -24%, normal RVSF, mild MR, trivial AI, borderline dilation of aortic root (39 mm)  . UTI (lower urinary tract infection)     Outpatient Medications Prior to Visit  Medication Sig Dispense Refill  . albuterol (VENTOLIN HFA) 108 (90 Base) MCG/ACT inhaler Inhale 2 puffs into the lungs every 4 (four) hours as needed for wheezing or shortness of breath. 1 each 11  . aspirin EC 81 MG tablet Take 1 tablet (81 mg total) by mouth daily.  Swallow whole. 90 tablet 3  . atorvastatin (LIPITOR) 20 MG tablet Take 1 tablet (20 mg total) by mouth daily. 90 tablet 3  . benazepril (LOTENSIN) 20 MG tablet Take 1 tablet (20 mg total) by mouth daily. 90 tablet 3  . BREO ELLIPTA 100-25 MCG/INH AEPB INHALE 1 PUFF INTO THE LUNGS DAILY 60 each 1  . DULoxetine (CYMBALTA) 30 MG capsule Take 1 capsule (30 mg total) by mouth daily. Take in addition to 60 mg for a total of 90 mg daily 90 capsule 1  . DULoxetine (CYMBALTA) 60 MG capsule TAKE ONE CAPSULE BY MOUTH DAILY 30 capsule 0  . gabapentin (NEURONTIN) 600 MG tablet TAKE ONE TABLET EVERY DAY AT BEDTIME 30 tablet 5  . HYDROcodone-acetaminophen (NORCO) 10-325 MG tablet Take 1 tablet by mouth every 8 (eight) hours as needed. Do Not Fill Before 08/17/2020 100 tablet 0  . ipratropium-albuterol (DUONEB) 0.5-2.5 (3) MG/3ML SOLN Take 3 mLs by nebulization every 4 (four) hours as needed (wheezing or SOB). During exacerbation 360 mL 1  . levothyroxine (SYNTHROID) 75 MCG tablet Take 1 tablet (75 mcg total) by mouth daily. 90 tablet 2  . metoprolol tartrate (LOPRESSOR) 50 MG tablet TAKE 2 TABLETS BY MOUTH TWICE DAILY 360 tablet 3   No facility-administered medications prior to visit.     Objective:     BP 140/86 (BP Location: Left Arm, Cuff Size: Normal)   Pulse 74   Temp 97.9 F (36.6 C)   Ht 5' (1.524 m)   Wt 161 lb (73 kg)   SpO2 92%   BMI 31.44 kg/m   SpO2: 92 %  RA  Hoarse pleasant amb wf nad   HEENT : pt wearing mask not removed for exam due to covid -19 concerns.    NECK :  without JVD/Nodes/TM/ nl carotid upstrokes bilaterally   LUNGS: no acc muscle use,  Mod/severekyphoscoliotic contour chest which is clear to A and P bilaterally without cough on insp or exp maneuvers   CV:  RRR  no s3 or murmur or increase in P2, and no edema   ABD:  soft and nontender with nl inspiratory excursion in the supine position. No bruits or organomegaly appreciated, bowel sounds nl  MS:  Nl gait/  ext warm without deformities, calf tenderness, cyanosis or clubbing No obvious joint restrictions   SKIN: warm and dry without lesions    NEURO:  alert, approp, nl sensorium with  no motor or cerebellar deficits apparent.    CXR PA and Lateral:   09/01/2020 :    I personally reviewed  with  impression as follows:   Scarring left base. No edema or airspace opacity. Stable cardiac silhouette. Hiatal hernia present. Marked scoliosis again noted.  Labs ordered 09/01/2020  :  allergy profile         Assessment   Asthma Onset: @ 26 mos old; improvement in teens Triggers (environmental, infectious, allergic):RTI Rescue inhaler [id number] rarely response:QVAR 2 puffs bid Smoking history:never Family history pulmonary disease: asthma in paternal family   PFTs, February, 2011, moderate obstructive disease with response to bronchodilators, normal lung volumes, moderate reduction in diffusing capacity  March 2017 pulmonary function testing consistent with severe airflow obstruction, ratio 56%, FEV1 0.77 L  - 09/01/2020  After extensive coaching inhaler device,  effectiveness =   50% from a baseline of 25 % > try Dulera 100 2bid and d/c breo due to hoarseness  - Allergy profile  09/01/20 >  Eos 0.4 /  IgE  1222  DDX of  difficult airways management almost all start with A and  include Adherence, Ace Inhibitors, Acid Reflux, Active Sinus Disease, Alpha 1 Antitripsin deficiency, Anxiety masquerading as Airways dz,  ABPA,  Allergy(esp in young), Aspiration (esp in elderly), Adverse effects of meds,  Active smoking or vaping, A bunch of PE's (a small clot burden can't cause this syndrome unless there is already severe underlying pulm or vascular dz with poor reserve) plus two Bs  = Bronchiectasis and Beta blocker use..and one C= CHF   .Adherence is always the initial "prime suspect" and is a multilayered concern that requires a "trust but verify" approach in every patient - starting with knowing  how to use medications, especially inhalers, correctly, keeping up with refills and understanding the fundamental difference between maintenance and prns vs those medications only taken for a very short course and then stopped and not refilled.  - see hfa teaching - return with all meds in hand using a trust but verify approach to confirm accurate Medication  Reconciliation The principal here is that until we are certain that the  patients are doing what we've asked, it makes no sense to ask them to do more.   ACEi adverse effects at the  top of the usual list of suspects and the only way to rule it out is a trial off > see a/p    ? Acid (or non-acid) GERD > always difficult to exclude as up to 75% of pts in some series report no assoc GI/ Heartburn symptoms and she has large HH> rec max (24h)  acid suppression and diet restrictions/ reviewed and instructions given in writing.   ? Allergy/ABPA  :  note  Marked elevation of IgE >eos: rx  Prednisone 10 mg take  4 each am x 2 days,   2 each am x 2 days,  1 each am x 2 days and stop / refer  For allergy consultation  ? advese effects of DPI > change to low dose symbicort  F/u in 4 weeks    Essential hypertension Changed acei to ARB 09/01/2020 due to "asthma flare" with clear exam  In the best review of chronic cough to date ( NEJM 2016 375 [id number]) ,  ACEi are now felt to cause cough in up to  20% of pts which is a 4 fold increase from previous reports and does not include the variety of non-specific complaints we see in pulmonary clinic in pts on ACEi but previously attributed to another dx like  Copd/asthma and  include PNDS, throat and chest congestion, "bronchitis", unexplained dyspnea and noct "strangling" sensations, and hoarseness, but also  atypical /refractory GERD symptoms like dysphagia and "bad heartburn"   The  only way I know  to prove this is not an "ACEi Case" is a trial off ACEi x a minimum of 4 weeks then regroup.  >>> try avapro  150 mg daily and recheck in 4 weeks          Each maintenance medication was reviewed in detail including emphasizing most importantly the difference between maintenance and prns and under what circumstances the prns are to be triggered using an action plan format where appropriate.  Total time for H and P, chart review, counseling, reviewing hfa device(s) and generating customized AVS unique to this new pt office visit / same day charting = 63 min           Christinia Gully, MD 09/01/2020

## 2020-09-02 LAB — CBC WITH DIFFERENTIAL/PLATELET
Basophils Absolute: 0 10*3/uL (ref 0.0–0.1)
Basophils Relative: 0.9 % (ref 0.0–3.0)
Eosinophils Absolute: 0.4 10*3/uL (ref 0.0–0.7)
Eosinophils Relative: 7.9 % — ABNORMAL HIGH (ref 0.0–5.0)
HCT: 42.3 % (ref 36.0–46.0)
Hemoglobin: 13.8 g/dL (ref 12.0–15.0)
Lymphocytes Relative: 18.5 % (ref 12.0–46.0)
Lymphs Abs: 0.9 10*3/uL (ref 0.7–4.0)
MCHC: 32.7 g/dL (ref 30.0–36.0)
MCV: 87.7 fl (ref 78.0–100.0)
Monocytes Absolute: 0.3 10*3/uL (ref 0.1–1.0)
Monocytes Relative: 5.5 % (ref 3.0–12.0)
Neutro Abs: 3.4 10*3/uL (ref 1.4–7.7)
Neutrophils Relative %: 67.2 % (ref 43.0–77.0)
Platelets: 209 10*3/uL (ref 150.0–400.0)
RBC: 4.82 Mil/uL (ref 3.87–5.11)
RDW: 14 % (ref 11.5–15.5)
WBC: 5 10*3/uL (ref 4.0–10.5)

## 2020-09-02 LAB — IGE: IgE (Immunoglobulin E), Serum: 1222 kU/L — ABNORMAL HIGH (ref <=114)

## 2020-09-02 NOTE — Assessment & Plan Note (Addendum)
Onset: @ 83 mos old; improvement in teens Triggers (environmental, infectious, allergic):RTI Rescue inhaler NTI:[id number] rarely response:QVAR 2 puffs bid Smoking history:never Family history pulmonary disease: asthma in paternal family   PFTs, February, 2011, moderate obstructive disease with response to bronchodilators, normal lung volumes, moderate reduction in diffusing capacity  March 2017 pulmonary function testing consistent with severe airflow obstruction, ratio 56%, FEV1 0.77 L  - 09/01/2020  After extensive coaching inhaler device,  effectiveness =   50% from a baseline of 25 % > try Dulera 100 2bid and d/c breo due to hoarseness  - Allergy profile  09/01/20 >  Eos 0.4 /  IgE  1222  DDX of  difficult airways management almost all start with A and  include Adherence, Ace Inhibitors, Acid Reflux, Active Sinus Disease, Alpha 1 Antitripsin deficiency, Anxiety masquerading as Airways dz,  ABPA,  Allergy(esp in young), Aspiration (esp in elderly), Adverse effects of meds,  Active smoking or vaping, A bunch of PE's (a small clot burden can't cause this syndrome unless there is already severe underlying pulm or vascular dz with poor reserve) plus two Bs  = Bronchiectasis and Beta blocker use..and one C= CHF   .Adherence is always the initial "prime suspect" and is a multilayered concern that requires a "trust but verify" approach in every patient - starting with knowing how to use medications, especially inhalers, correctly, keeping up with refills and understanding the fundamental difference between maintenance and prns vs those medications only taken for a very short course and then stopped and not refilled.  - see hfa teaching - return with all meds in hand using a trust but verify approach to confirm accurate Medication  Reconciliation The principal here is that until we are certain that the  patients are doing what we've asked, it makes no sense to ask them to do more.   ACEi adverse effects  at the  top of the usual list of suspects and the only way to rule it out is a trial off > see a/p    ? Acid (or non-acid) GERD > always difficult to exclude as up to 75% of pts in some series report no assoc GI/ Heartburn symptoms and she has large HH> rec max (24h)  acid suppression and diet restrictions/ reviewed and instructions given in writing.   ? Allergy/ABPA  :  note  Marked elevation of IgE >eos: rx  Prednisone 10 mg take  4 each am x 2 days,   2 each am x 2 days,  1 each am x 2 days and stop / refer  For allergy consultation  ? advese effects of DPI > change to low dose symbicort  F/u in 4 weeks

## 2020-09-02 NOTE — Assessment & Plan Note (Addendum)
Changed acei to ARB 09/01/2020 due to "asthma flare" with clear exam  In the best review of chronic cough to date ( NEJM 2016 375 [id number]) ,  ACEi are now felt to cause cough in up to  20% of pts which is a 4 fold increase from previous reports and does not include the variety of non-specific complaints we see in pulmonary clinic in pts on ACEi but previously attributed to another dx like  Copd/asthma and  include PNDS, throat and chest congestion, "bronchitis", unexplained dyspnea and noct "strangling" sensations, and hoarseness, but also  atypical /refractory GERD symptoms like dysphagia and "bad heartburn"   The only way I know  to prove this is not an "ACEi Case" is a trial off ACEi x a minimum of 4 weeks then regroup.  >>> try avapro 150 mg daily and recheck in 4 weeks          Each maintenance medication was reviewed in detail including emphasizing most importantly the difference between maintenance and prns and under what circumstances the prns are to be triggered using an action plan format where appropriate.  Total time for H and P, chart review, counseling, reviewing hfa device(s) and generating customized AVS unique to this new pt office visit / same day charting = 63 min

## 2020-09-03 ENCOUNTER — Encounter: Payer: Self-pay | Admitting: Internal Medicine

## 2020-09-03 ENCOUNTER — Encounter: Payer: Self-pay | Admitting: *Deleted

## 2020-09-03 ENCOUNTER — Other Ambulatory Visit: Payer: Self-pay | Admitting: Internal Medicine

## 2020-09-03 NOTE — Progress Notes (Signed)
LMTCB

## 2020-09-04 ENCOUNTER — Other Ambulatory Visit: Payer: Self-pay | Admitting: Internal Medicine

## 2020-09-04 ENCOUNTER — Telehealth: Payer: Self-pay | Admitting: Internal Medicine

## 2020-09-04 DIAGNOSIS — J4541 Moderate persistent asthma with (acute) exacerbation: Secondary | ICD-10-CM

## 2020-09-04 NOTE — Telephone Encounter (Signed)
Called and spoke with patient who was returning phone call to go over lab and xray results from 09/01/20. Went over lab and xray results per Dr Melvyn Novas with patient. All questions answered and patient expressed full understanding. Patient agreeable at this time for referral to Dr Bruna Potter group. Order placed per Dr Melvyn Novas. Nothing further needed at this time.   Tanda Rockers, MD  09/02/2020 3:41 PM EDT      Call patient : Studies are c/w severe allergy > refer to [place] group     Tanda Rockers, MD  09/03/2020 1:05 PM EDT      Notify pt: Reviewed cxr and no acute change so no change in recommendations made at [facility]

## 2020-09-15 ENCOUNTER — Encounter: Payer: Medicare Other | Admitting: Registered Nurse

## 2020-09-24 ENCOUNTER — Other Ambulatory Visit: Payer: Self-pay

## 2020-09-24 ENCOUNTER — Encounter: Payer: Medicare Other | Attending: Physical Medicine & Rehabilitation | Admitting: Registered Nurse

## 2020-09-24 ENCOUNTER — Encounter: Payer: Self-pay | Admitting: Registered Nurse

## 2020-09-24 VITALS — BP 153/88 | HR 69 | Temp 98.2°F | Ht 60.0 in | Wt 161.8 lb

## 2020-09-24 DIAGNOSIS — M24551 Contracture, right hip: Secondary | ICD-10-CM | POA: Diagnosis not present

## 2020-09-24 DIAGNOSIS — M48062 Spinal stenosis, lumbar region with neurogenic claudication: Secondary | ICD-10-CM | POA: Insufficient documentation

## 2020-09-24 DIAGNOSIS — Z79891 Long term (current) use of opiate analgesic: Secondary | ICD-10-CM | POA: Insufficient documentation

## 2020-09-24 DIAGNOSIS — M961 Postlaminectomy syndrome, not elsewhere classified: Secondary | ICD-10-CM | POA: Diagnosis not present

## 2020-09-24 DIAGNOSIS — Z5181 Encounter for therapeutic drug level monitoring: Secondary | ICD-10-CM | POA: Diagnosis not present

## 2020-09-24 DIAGNOSIS — G894 Chronic pain syndrome: Secondary | ICD-10-CM | POA: Insufficient documentation

## 2020-09-24 DIAGNOSIS — H34832 Tributary (branch) retinal vein occlusion, left eye, with macular edema: Secondary | ICD-10-CM | POA: Diagnosis not present

## 2020-09-24 MED ORDER — HYDROCODONE-ACETAMINOPHEN 10-325 MG PO TABS: 1.0000 | ORAL_TABLET | Freq: Three times a day (TID) | ORAL | 0 refills | Status: DC | PRN

## 2020-09-24 NOTE — Progress Notes (Signed)
Subjective:    Patient ID: Hannah Scott, female    DOB: 12-30-1950, 70 y.o.   MRN: [id number]  HPI: Hannah Scott is a 70 y.o. female who returns for follow up appointment for chronic pain and medication refill. She states her pain is located in her lower back. She rates her pain 7. Her current exercise regime is walking short distances.  Hannah Scott arrived to office with oxygen desaturation, pulse Ox was re-checked O2 sats 93%. Hannah Scott stated her oxygen was low due to wearing the mask with her diagnosis of asthma. She will follow up with her PCP, she verbalizes understanding.   Hannah Scott Morphine equivalent is 33.33 MME.  Oral Swab was Performed on 06/12/2020, it was consistent.     Pain Inventory Average Pain 8 Pain Right Now 7 My pain is constant, burning, tingling and aching  In the last 24 hours, has pain interfered with the following? General activity 10 Relation with others 10 Enjoyment of life 10 What TIME of day is your pain at its worst? morning , daytime, evening and night Sleep (in general) Poor  Pain is worse with: walking and standing Pain improves with: rest and medication Relief from Meds: 7  Family History  Problem Relation Age of Onset  . Heart failure Mother   . Pneumonia Mother   . Hypertension Mother   . Heart disease Mother   . Stroke Maternal Grandfather   . Hypertension Maternal Grandfather   . Heart attack Father   . Heart disease Father   . Asthma Paternal Uncle        PAT UNCLES  . Heart attack Paternal Uncle    Social History   Socioeconomic History  . Marital status: Widowed    Spouse name: Not on file  . Number of children: Not on file  . Years of education: Not on file  . Highest education level: Not on file  Occupational History  . Not on file  Tobacco Use  . Smoking status: Never Smoker  . Smokeless tobacco: Never Used  Substance and Sexual Activity  . Alcohol use: No    Alcohol/week: 0.0 standard drinks  . Drug use: No  . Sexual  activity: Never    Birth control/protection: Surgical  Other Topics Concern  . Not on file  Social History Narrative  . Not on file   Social Determinants of Health   Financial Resource Strain: Not on file  Food Insecurity: Not on file  Transportation Needs: Not on file  Physical Activity: Not on file  Stress: Not on file  Social Connections: Not on file   Past Surgical History:  Procedure Laterality Date  . ANKLE SURGERY Left    ligament  . APPENDECTOMY  1987   AT [facility]  . BACK SURGERY     Fusion  . BREAST LUMPECTOMY  2003   radiation on right  . BREAST LUMPECTOMY WITH RADIOACTIVE SEED LOCALIZATION Left 02/12/2016   Procedure: LEFT BREAST LUMPECTOMY WITH RADIOACTIVE SEED LOCALIZATION;  Surgeon: Excell Seltzer, MD;  Location: [place];  Service: General;  Laterality: Left;  . CARDIOVASCULAR STRESS TEST  09/07/05   Nuclear, was negative  . CATARACT EXTRACTION Bilateral 01,03  . OOPHORECTOMY     LSO -RSO  . PELVIC LAPAROSCOPY  1989   RSO, LYSIS OF ADHESIONS  . TOTAL ABDOMINAL HYSTERECTOMY  1987   LSO, APPENDECTOMY  . TOTAL HIP ARTHROPLASTY Right 10/28/2013   dr Lorin Mercy  . TOTAL HIP ARTHROPLASTY  Right 10/28/2013   Procedure: TOTAL HIP ARTHROPLASTY ANTERIOR APPROACH;  Surgeon: Marybelle Killings, MD;  Location: [place];  Service: Orthopedics;  Laterality: Right;  Right Total Hip Arthroplasty, Direct Anterior Approach   Past Surgical History:  Procedure Laterality Date  . ANKLE SURGERY Left    ligament  . APPENDECTOMY  1987   AT [facility]  . BACK SURGERY     Fusion  . BREAST LUMPECTOMY  2003   radiation on right  . BREAST LUMPECTOMY WITH RADIOACTIVE SEED LOCALIZATION Left 02/12/2016   Procedure: LEFT BREAST LUMPECTOMY WITH RADIOACTIVE SEED LOCALIZATION;  Surgeon: Excell Seltzer, MD;  Location: [place];  Service: General;  Laterality: Left;  . CARDIOVASCULAR STRESS TEST  09/07/05   Nuclear, was negative  . CATARACT EXTRACTION Bilateral 01,03  .  OOPHORECTOMY     LSO -RSO  . PELVIC LAPAROSCOPY  1989   RSO, LYSIS OF ADHESIONS  . TOTAL ABDOMINAL HYSTERECTOMY  1987   LSO, APPENDECTOMY  . TOTAL HIP ARTHROPLASTY Right 10/28/2013   dr Lorin Mercy  . TOTAL HIP ARTHROPLASTY Right 10/28/2013   Procedure: TOTAL HIP ARTHROPLASTY ANTERIOR APPROACH;  Surgeon: Marybelle Killings, MD;  Location: [place];  Service: Orthopedics;  Laterality: Right;  Right Total Hip Arthroplasty, Direct Anterior Approach   Past Medical History:  Diagnosis Date  . Anemia    hx  . Arthritis   . Asthma    PFTs, February, 2011, moderate obstructive disease with response to bronchodilators, normal lung volumes, moderate reduction in diffusing capacity  . Atrial septal aneurysm    Echo, 2008-not noted on 13 echo  . CAD (coronary artery disease)    90% distal LAD in the past  /   nuclear, 2008, no ischemia, ejection fraction 70%  . Cancer ([place]) 2002   DUCTAL CIS--S/P LUMPECTOMY, RADIATION AND 6 WEEKS OF TAMOXIFEN  . D-dimer, elevated    January, 2014  . Depression   . Ejection fraction    EF 60%, echo, October, 2008  . Elevated CPK    January, 2014  . Endometriosis 1989   RIGHT TUBE  . Endometriosis 1987   LEFT TUBE/OVARY W FOCAL IN-SITU ENDOMETRIAL ADENOCARCINOMA  . GERD (gastroesophageal reflux disease)    occ  . H/O hiatal hernia    ?  [name] Hyperlipidemia   . Hypertension   . Hypothyroidism    Patient has had in the past that she does not need treatment  . Kyphoscoliosis   . Obstructive airway disease ([place])   . Pinched nerve    lower back  . Shortness of breath    Echo 2/22: EF 60-65, no RWMA, mild LVH, GR 1  DD, GLS -24%, normal RVSF, mild MR, trivial AI, borderline dilation of aortic root (39 mm)  . UTI (lower urinary tract infection)    There were no vitals taken for this visit.  Opioid Risk Score:   Fall Risk Score:  `1  Depression screen PHQ 2/9  Depression screen [facility] 2/9 09/24/2020 08/06/2020 07/21/2020 07/21/2020 04/03/2020 12/06/2019 11/08/2019  Decreased  Interest 2 1 1  0 3 3 3   Down, Depressed, Hopeless 2 1 1  - 3 2 2   PHQ - 2 Score 4 2 2  0 6 5 5   Altered sleeping - 1 - - - - -  Tired, decreased energy - - - - - - -  Change in appetite - - - - - - -  Feeling bad or failure about yourself  - - - - - - -  Trouble concentrating - - - - - - -  Moving slowly or fidgety/restless - - - - - - -  Suicidal thoughts - - - - - - -  PHQ-9 Score - 3 - - - - -  Difficult doing work/chores - - - - - - -  Some recent data might be hidden    Review of Systems  Constitutional: Negative.   HENT: Negative.   Eyes: Negative.   Respiratory: Positive for shortness of breath.   Cardiovascular: Negative.   Gastrointestinal: Negative.   Endocrine: Negative.   Genitourinary: Negative.   Musculoskeletal: Positive for back pain.  Skin: Negative.   Allergic/Immunologic: Negative.   Neurological:       Tingling  Hematological: Negative.   Psychiatric/Behavioral: Negative.   All other systems reviewed and are negative.      Objective:   Physical Exam Vitals and nursing note reviewed.  Constitutional:      Appearance: Normal appearance.  Cardiovascular:     Rate and Rhythm: Normal rate and regular rhythm.     Pulses: Normal pulses.     Heart sounds: Normal heart sounds.  Pulmonary:     Effort: Pulmonary effort is normal.     Breath sounds: Normal breath sounds.  Musculoskeletal:     Cervical back: Normal range of motion and neck supple.     Comments: Normal Muscle Bulk and Muscle Testing Reveals: Upper Extremities: Full ROM and Muscle Strength 5/5  Lumbar Paraspinal Tenderness: L-3-L-5 Lower Extremities: Full ROM and Muscle Strength 5/5 Transfer from Table to wheelchair and escorted to her car   Skin:    General: Skin is warm and dry.  Neurological:     Mental Status: She is alert and oriented to person, place, and time.  Psychiatric:        Mood and Affect: Mood normal.        Behavior: Behavior normal.           Assessment & Plan:   1.Lumbar post laminectomy syndromewith severe kyphoscoliosis thoracolumbar spine, s/p lumbar fusion/ 09/24/2020 Continue current medication regimen.Refilled: Hydrocodone 10/325mg  one tablet every8hours may take an extra tablet when pain is severe#100. We will continue the opioid monitoring program, this consists of regular clinic visits, examinations, urine drug screen, pill counts as well as use of [place] Controlled Substance Reporting system. A 12 month History has been reviewed on the [place] Controlled Substance Reporting Systemon06/05/2020 2. Right Hip OA: S/P Right Hip Replacement 10/28/2013.09/24/2020 3.Depression: Continue Cymbalta. Has Family Support and Friends. Counseling with Doristine Bosworth.09/24/2020. 4.RightGreater Trochanteric Tenderness:No complaints today.Continue to Monitor. Continue with Ice and Heat Therapy.09/24/2020  F/U in 1 month

## 2020-09-29 ENCOUNTER — Telehealth: Payer: Self-pay | Admitting: Internal Medicine

## 2020-09-29 ENCOUNTER — Ambulatory Visit: Payer: Medicare Other | Admitting: Internal Medicine

## 2020-09-29 NOTE — Telephone Encounter (Signed)
LVM for pt to rtn my call to schedule AWV with [facility]. Please schedule this appt if pt calls the office.  °

## 2020-10-03 ENCOUNTER — Other Ambulatory Visit: Payer: Self-pay | Admitting: Internal Medicine

## 2020-10-08 ENCOUNTER — Other Ambulatory Visit: Payer: Self-pay

## 2020-10-08 ENCOUNTER — Ambulatory Visit (INDEPENDENT_AMBULATORY_CARE_PROVIDER_SITE_OTHER): Payer: Medicare Other

## 2020-10-08 VITALS — BP 118/70 | HR 69 | Temp 98.3°F | Ht 60.0 in | Wt 155.0 lb

## 2020-10-08 DIAGNOSIS — Z0000 Encounter for general adult medical examination without abnormal findings: Secondary | ICD-10-CM

## 2020-10-08 NOTE — Patient Instructions (Signed)
Hannah Scott , Thank you for taking time to come for your Medicare Wellness Visit. I appreciate your ongoing commitment to your health goals. Please review the following plan we discussed and let me know if I can assist you in the future.   Screening recommendations/referrals: Colonoscopy: last done 04/09/2004; due every 10 years Mammogram: 01/16/2020; due every year Bone Density: 01/16/2020; due every 2 years Recommended yearly ophthalmology/optometry visit for glaucoma screening and checkup Recommended yearly dental visit for hygiene and checkup  Vaccinations: Influenza vaccine: due 11/23/2020 Pneumococcal vaccine: 05/06/2015, 07/06/2016 Tdap vaccine: 01/09/2019; due every 10 years Shingles vaccine: never done   Covid-19: 06/21/2019, 07/24/2019, 04/10/2020  Advanced directives: Advance directive discussed with you today. I have provided a copy for you to complete at home and have notarized. Once this is complete please bring a copy in to our office so we can scan it into your chart.  Conditions/risks identified: No goals at this time.  Next appointment: Please schedule your next Medicare Wellness Visit with your Nurse Health Advisor in 1 year by calling [phone].   Preventive Care 19 Years and Older, Female Preventive care refers to lifestyle choices and visits with your health care provider that can promote health and wellness. What does preventive care include? A yearly physical exam. This is also called an annual well check. Dental exams once or twice a year. Routine eye exams. Ask your health care provider how often you should have your eyes checked. Personal lifestyle choices, including: Daily care of your teeth and gums. Regular physical activity. Eating a healthy diet. Avoiding tobacco and drug use. Limiting alcohol use. Practicing safe sex. Taking low-dose aspirin every day. Taking vitamin and mineral supplements as recommended by your health care provider. What happens during  an annual well check? The services and screenings done by your health care provider during your annual well check will depend on your age, overall health, lifestyle risk factors, and family history of disease. Counseling  Your health care provider may ask you questions about your: Alcohol use. Tobacco use. Drug use. Emotional well-being. Home and relationship well-being. Sexual activity. Eating habits. History of falls. Memory and ability to understand (cognition). Work and work Statistician. Reproductive health. Screening  You may have the following tests or measurements: Height, weight, and BMI. Blood pressure. Lipid and cholesterol levels. These may be checked every 5 years, or more frequently if you are over 70 years old. Skin check. Lung cancer screening. You may have this screening every year starting at age 9 if you have a 30-pack-year history of smoking and currently smoke or have quit within the past 15 years. Fecal occult blood test (FOBT) of the stool. You may have this test every year starting at age 29. Flexible sigmoidoscopy or colonoscopy. You may have a sigmoidoscopy every 5 years or a colonoscopy every 10 years starting at age 82. Hepatitis C blood test. Hepatitis B blood test. Sexually transmitted disease (STD) testing. Diabetes screening. This is done by checking your blood sugar (glucose) after you have not eaten for a while (fasting). You may have this done every 1-3 years. Bone density scan. This is done to screen for osteoporosis. You may have this done starting at age 37. Mammogram. This may be done every 1-2 years. Talk to your health care provider about how often you should have regular mammograms. Talk with your health care provider about your test results, treatment options, and if necessary, the need for more tests. Vaccines  Your health care provider may  recommend certain vaccines, such as: Influenza vaccine. This is recommended every year. Tetanus,  diphtheria, and acellular pertussis (Tdap, Td) vaccine. You may need a Td booster every 10 years. Zoster vaccine. You may need this after age 19. Pneumococcal 13-valent conjugate (PCV13) vaccine. One dose is recommended after age 14. Pneumococcal polysaccharide (PPSV23) vaccine. One dose is recommended after age 21. Talk to your health care provider about which screenings and vaccines you need and how often you need them. This information is not intended to replace advice given to you by your health care provider. Make sure you discuss any questions you have with your health care provider. Document Released: 05/08/2015 Document Revised: 12/30/2015 Document Reviewed: 02/10/2015 Elsevier Interactive Patient Education  2017 [place] Prevention in the Home Falls can cause injuries. They can happen to people of all ages. There are many things you can do to make your home safe and to help prevent falls. What can I do on the outside of my home? Regularly fix the edges of walkways and driveways and fix any cracks. Remove anything that might make you trip as you walk through a door, such as a raised step or threshold. Trim any bushes or trees on the path to your home. Use bright outdoor lighting. Clear any walking paths of anything that might make someone trip, such as rocks or tools. Regularly check to see if handrails are loose or broken. Make sure that both sides of any steps have handrails. Any raised decks and porches should have guardrails on the edges. Have any leaves, snow, or ice cleared regularly. Use sand or salt on walking paths during winter. Clean up any spills in your garage right away. This includes oil or grease spills. What can I do in the bathroom? Use night lights. Install grab bars by the toilet and in the tub and shower. Do not use towel bars as grab bars. Use non-skid mats or decals in the tub or shower. If you need to sit down in the shower, use a plastic,  non-slip stool. Keep the floor dry. Clean up any water that spills on the floor as soon as it happens. Remove soap buildup in the tub or shower regularly. Attach bath mats securely with double-sided non-slip rug tape. Do not have throw rugs and other things on the floor that can make you trip. What can I do in the bedroom? Use night lights. Make sure that you have a light by your bed that is easy to reach. Do not use any sheets or blankets that are too big for your bed. They should not hang down onto the floor. Have a firm chair that has side arms. You can use this for support while you get dressed. Do not have throw rugs and other things on the floor that can make you trip. What can I do in the kitchen? Clean up any spills right away. Avoid walking on wet floors. Keep items that you use a lot in easy-to-reach places. If you need to reach something above you, use a strong step stool that has a grab bar. Keep electrical cords out of the way. Do not use floor polish or wax that makes floors slippery. If you must use wax, use non-skid floor wax. Do not have throw rugs and other things on the floor that can make you trip. What can I do with my stairs? Do not leave any items on the stairs. Make sure that there are handrails on both sides of  the stairs and use them. Fix handrails that are broken or loose. Make sure that handrails are as long as the stairways. Check any carpeting to make sure that it is firmly attached to the stairs. Fix any carpet that is loose or worn. Avoid having throw rugs at the top or bottom of the stairs. If you do have throw rugs, attach them to the floor with carpet tape. Make sure that you have a light switch at the top of the stairs and the bottom of the stairs. If you do not have them, ask someone to add them for you. What else can I do to help prevent falls? Wear shoes that: Do not have high heels. Have rubber bottoms. Are comfortable and fit you well. Are closed  at the toe. Do not wear sandals. If you use a stepladder: Make sure that it is fully opened. Do not climb a closed stepladder. Make sure that both sides of the stepladder are locked into place. Ask someone to hold it for you, if possible. Clearly mark and make sure that you can see: Any grab bars or handrails. First and last steps. Where the edge of each step is. Use tools that help you move around (mobility aids) if they are needed. These include: Canes. Walkers. Scooters. Crutches. Turn on the lights when you go into a dark area. Replace any light bulbs as soon as they burn out. Set up your furniture so you have a clear path. Avoid moving your furniture around. If any of your floors are uneven, fix them. If there are any pets around you, be aware of where they are. Review your medicines with your doctor. Some medicines can make you feel dizzy. This can increase your chance of falling. Ask your doctor what other things that you can do to help prevent falls. This information is not intended to replace advice given to you by your health care provider. Make sure you discuss any questions you have with your health care provider. Document Released: 02/05/2009 Document Revised: 09/17/2015 Document Reviewed: 05/16/2014 Elsevier Interactive Patient Education  2017 [employer].

## 2020-10-08 NOTE — Progress Notes (Signed)
Subjective:   Hannah Scott is a 70 y.o. female who presents for Medicare Annual (Subsequent) preventive examination.  Review of Systems     Cardiac Risk Factors include: advanced age (>76men, >40 women);dyslipidemia;family history of premature cardiovascular disease;hypertension;obesity (BMI >30kg/m2)     Objective:    Today's Vitals   10/08/20 1338  BP: 118/70  Pulse: 69  Temp: 98.3 F (36.8 C)  SpO2: 94%  Weight: 155 lb (70.3 kg)  Height: 5' (1.524 m)  PainSc: 0-No pain   Body mass index is 30.27 kg/m.  Advanced Directives 10/08/2020 09/11/2019 01/27/2017 01/10/2017 10/18/2016 09/08/2016 08/02/2016  Does Patient Have a Medical Advance Directive? No No Yes No No No No  Would patient like information on creating a medical advance directive? Yes (MAU/Ambulatory/Procedural Areas - Information given) No - Patient declined - Yes (ED - Information included in AVS) - No - Patient declined -    Current Medications (verified) Outpatient Encounter Medications as of 10/08/2020  Medication Sig   albuterol (VENTOLIN HFA) 108 (90 Base) MCG/ACT inhaler Inhale 2 puffs into the lungs every 4 (four) hours as needed for wheezing or shortness of breath.   aspirin EC 81 MG tablet Take 1 tablet (81 mg total) by mouth daily. Swallow whole.   atorvastatin (LIPITOR) 20 MG tablet Take 1 tablet (20 mg total) by mouth daily.   DULoxetine (CYMBALTA) 30 MG capsule Take 1 capsule (30 mg total) by mouth daily. Take in addition to 60 mg for a total of 90 mg daily   DULoxetine (CYMBALTA) 60 MG capsule TAKE ONE CAPSULE BY MOUTH DAILY   famotidine (PEPCID) 20 MG tablet One after supper   gabapentin (NEURONTIN) 600 MG tablet TAKE ONE TABLET EVERY DAY AT BEDTIME   HYDROcodone-acetaminophen (NORCO) 10-325 MG tablet Take 1 tablet by mouth every 8 (eight) hours as needed.   ipratropium-albuterol (DUONEB) 0.5-2.5 (3) MG/3ML SOLN Take 3 mLs by nebulization every 4 (four) hours as needed (wheezing or SOB). During  exacerbation   irbesartan (AVAPRO) 150 MG tablet Take 1 tablet (150 mg total) by mouth daily.   levothyroxine (SYNTHROID) 75 MCG tablet Take 1 tablet (75 mcg total) by mouth daily.   metoprolol tartrate (LOPRESSOR) 50 MG tablet TAKE 2 TABLETS BY MOUTH TWICE DAILY   mometasone-formoterol (DULERA) 100-5 MCG/ACT AERO Take 2 puffs first thing in am and then another 2 puffs about 12 hours later.   pantoprazole (PROTONIX) 40 MG tablet Take 1 tablet (40 mg total) by mouth daily. Take 30-60 min before first meal of the day   predniSONE (DELTASONE) 10 MG tablet Take  4 each am x 2 days,   2 each am x 2 days,  1 each am x 2 days and stop   No facility-administered encounter medications on file as of 10/08/2020.    Allergies (verified) Fluticasone-salmeterol, Norvasc [amlodipine besylate], and Tape   History: Past Medical History:  Diagnosis Date   Anemia    hx   Arthritis    Asthma    PFTs, February, 2011, moderate obstructive disease with response to bronchodilators, normal lung volumes, moderate reduction in diffusing capacity   Atrial septal aneurysm    Echo, 2008-not noted on 13 echo   CAD (coronary artery disease)    90% distal LAD in the past  /   nuclear, 2008, no ischemia, ejection fraction 70%   Cancer ([place]) 2002   DUCTAL CIS--S/P LUMPECTOMY, RADIATION AND 6 WEEKS OF TAMOXIFEN   D-dimer, elevated    January, 2014  Depression    Ejection fraction    EF 60%, echo, October, 2008   Elevated CPK    January, 2014   Endometriosis 1989   RIGHT TUBE   Endometriosis 1987   LEFT TUBE/OVARY W FOCAL IN-SITU ENDOMETRIAL ADENOCARCINOMA   GERD (gastroesophageal reflux disease)    occ   H/O hiatal hernia    ?   Hyperlipidemia    Hypertension    Hypothyroidism    Patient has had in the past that she does not need treatment   Kyphoscoliosis    Obstructive airway disease ([place])    Pinched nerve    lower back   Shortness of breath    Echo 2/22: EF 60-65, no RWMA, mild LVH, GR 1  DD, GLS  -24%, normal RVSF, mild MR, trivial AI, borderline dilation of aortic root (39 mm)   UTI (lower urinary tract infection)    Past Surgical History:  Procedure Laterality Date   ANKLE SURGERY Left    ligament   APPENDECTOMY  1987   AT [facility]   BACK SURGERY     Fusion   BREAST LUMPECTOMY  2003   radiation on right   BREAST LUMPECTOMY WITH RADIOACTIVE SEED LOCALIZATION Left 02/12/2016   Procedure: LEFT BREAST LUMPECTOMY WITH RADIOACTIVE SEED LOCALIZATION;  Surgeon: Excell Seltzer, MD;  Location: [facility];  Service: General;  Laterality: Left;   CARDIOVASCULAR STRESS TEST  09/07/05   Nuclear, was negative   CATARACT EXTRACTION Bilateral 01,03   OOPHORECTOMY     LSO -RSO   PELVIC LAPAROSCOPY  1989   RSO, LYSIS OF ADHESIONS   TOTAL ABDOMINAL HYSTERECTOMY  1987   LSO, APPENDECTOMY   TOTAL HIP ARTHROPLASTY Right 10/28/2013   dr Lorin Mercy   TOTAL HIP ARTHROPLASTY Right 10/28/2013   Procedure: TOTAL HIP ARTHROPLASTY ANTERIOR APPROACH;  Surgeon: Marybelle Killings, MD;  Location: [facility];  Service: Orthopedics;  Laterality: Right;  Right Total Hip Arthroplasty, Direct Anterior Approach   Family History  Problem Relation Age of Onset   Heart failure Mother    Pneumonia Mother    Hypertension Mother    Heart disease Mother    Stroke Maternal Grandfather    Hypertension Maternal Grandfather    Heart attack Father    Heart disease Father    Asthma Paternal Uncle        PAT UNCLES   Heart attack Paternal Uncle    Social History   Socioeconomic History   Marital status: Widowed    Spouse name: Not on file   Number of children: Not on file   Years of education: Not on file   Highest education level: Not on file  Occupational History   Not on file  Tobacco Use   Smoking status: Never   Smokeless tobacco: Never  Substance and Sexual Activity   Alcohol use: No    Alcohol/week: 0.0 standard drinks   Drug use: No   Sexual activity: Never    Birth control/protection: Surgical   Other Topics Concern   Not on file  Social History Narrative   Not on file   Social Determinants of Health   Financial Resource Strain: Low Risk    Difficulty of Paying Living Expenses: Not hard at all  Food Insecurity: No Food Insecurity   Worried About Charity fundraiser in the Last Year: Never true   [place] in the Last Year: Never true  Transportation Needs: No Transportation Needs   Lack of Transportation (  Medical): No   Lack of Transportation (Non-Medical): No  Physical Activity: Inactive   Days of Exercise per Week: 0 days   Minutes of Exercise per Session: 0 min  Stress: No Stress Concern Present   Feeling of Stress : Not at all  Social Connections: Moderately Integrated   Frequency of Communication with Friends and Family: More than three times a week   Frequency of Social Gatherings with Friends and Family: More than three times a week   Attends Religious Services: More than 4 times per year   Active Member of Clubs or Organizations: No   Attends Music therapist: More than 4 times per year   Marital Status: Widowed    Tobacco Counseling Counseling given: Not Answered   Clinical Intake:  Pre-visit preparation completed: Yes  Pain : No/denies pain Pain Score: 0-No pain     BMI - recorded: 30.27 Nutritional Status: BMI > 30  Obese Nutritional Risks: None Diabetes: No  How often do you need to have someone help you when you read instructions, pamphlets, or other written materials from your doctor or pharmacy?: 1 - Never What is the last grade level you completed in school?: High School Graduate  Diabetic? no  Interpreter Needed?: No  Information entered by :: Lisette Abu, LPN   Activities of Daily Living In your present state of health, do you have any difficulty performing the following activities: 10/08/2020  Hearing? N  Vision? N  Difficulty concentrating or making decisions? Y  Walking or climbing stairs? Y   Dressing or bathing? N  Doing errands, shopping? N  Preparing Food and eating ? N  Using the Toilet? N  In the past six months, have you accidently leaked urine? N  Do you have problems with loss of bowel control? N  Managing your Medications? N  Managing your Finances? N  Housekeeping or managing your Housekeeping? N  Some recent data might be hidden    Patient Care Team: Binnie Rail, MD as PCP - General (Internal Medicine) Dorothy Spark, MD (Inactive) as PCP - Cardiology (Cardiology) Jalene Mullet, MD as Consulting Physician (Ophthalmology) Tanda Rockers, MD as Consulting Physician (Pulmonary Disease)  Indicate any recent Medical Services you may have received from other than [facility] providers in the past year (date may be approximate).     Assessment:   This is a routine wellness examination for Elyanah.  Hearing/Vision screen Hearing Screening - Comments:: Patient denies any issues with hearing. Vision Screening - Comments:: Patient uses readers for fine print.  Gets injections in her eye.  Patient of [facility]/Dr. Posey Pronto.  Dietary issues and exercise activities discussed: Current Exercise Habits: The patient does not participate in regular exercise at present, Exercise limited by: orthopedic condition(s);cardiac condition(s);respiratory conditions(s);psychological condition(s)   Goals Addressed   None   Depression Screen PHQ 2/9 Scores 10/08/2020 09/24/2020 08/06/2020 07/21/2020 07/21/2020 04/03/2020 12/06/2019  PHQ - 2 Score 4 4 2 2  0 6 5  PHQ- 9 Score - - 3 - - - -  Exception Documentation - - - - - - -  Not completed - - - - - - -    Fall Risk Fall Risk  10/08/2020 09/24/2020 08/06/2020 07/21/2020 06/12/2020  Falls in the past year? 0 0 0 0 0  Number falls in past yr: 0 - 0 0 -  Injury with Fall? 0 - 0 0 -  Risk for fall due to : Impaired balance/gait Impaired balance/gait - - -  Follow up - - - - -    FALL RISK PREVENTION PERTAINING TO THE HOME:  Any  stairs in or around the home? No  If so, are there any without handrails? No  Home free of loose throw rugs in walkways, pet beds, electrical cords, etc? Yes  Adequate lighting in your home to reduce risk of falls? Yes   ASSISTIVE DEVICES UTILIZED TO PREVENT FALLS:  Life alert? No  Use of a cane, walker or w/c? Yes  Grab bars in the bathroom? No  Shower chair or bench in shower? Yes  Elevated toilet seat or a handicapped toilet? Yes   TIMED UP AND GO:  Was the test performed? Yes .  Length of time to ambulate 10 feet: 13 sec.   Gait slow and steady with assistive device  Cognitive Function:Normal cognitive status assessed by direct observation by this Nurse Health Advisor. No abnormalities found.   MMSE - Mini Mental State Exam 01/10/2017  Orientation to time 5  Orientation to Place 5  Registration 3  Attention/ Calculation 5  Recall 2  Language- name 2 objects 2  Language- repeat 1  Language- follow 3 step command 3  Language- read & follow direction 1  Write a sentence 1  Copy design 1  Total score 29     6CIT Screen 09/11/2019  What Year? 0 points  What month? 0 points  What time? 3 points  Count back from 20 0 points  Months in reverse 0 points  Repeat phrase 0 points  Total Score 3    Immunizations Immunization History  Administered Date(s) Administered   Fluad Quad(high Dose 65+) 01/04/2019   Influenza Split 03/23/2011, 02/15/2012   Influenza Whole 02/12/2003, 12/24/2009   Influenza, High Dose Seasonal PF 01/06/2016, 01/10/2017, 03/16/2018   Influenza,inj,Quad PF,6+ Mos 01/29/2013, 01/03/2014, 02/11/2015   Moderna Sars-Covid-2 Vaccination 06/21/2019, 07/24/2019   Pneumococcal Conjugate-13 05/06/2015   Pneumococcal Polysaccharide-23 07/06/2016   Tdap 01/09/2019    TDAP status: Up to date  Flu Vaccine status: Declined, Education has been provided regarding the importance of this vaccine but patient still declined. Advised may receive this vaccine at  local pharmacy or Health Dept. Aware to provide a copy of the vaccination record if obtained from local pharmacy or Health Dept. Verbalized acceptance and understanding.  Pneumococcal vaccine status: Up to date  Covid-19 vaccine status: Completed vaccines  Qualifies for Shingles Vaccine? Yes   Zostavax completed No   Shingrix Completed?: No.    Education has been provided regarding the importance of this vaccine. Patient has been advised to call insurance company to determine out of pocket expense if they have not yet received this vaccine. Advised may also receive vaccine at local pharmacy or Health Dept. Verbalized acceptance and understanding.  Screening Tests Health Maintenance  Topic Date Due   Zoster Vaccines- Shingrix (1 of 2) Never done   COLONOSCOPY (Pts 45-57yrs Insurance coverage will need to be confirmed)  04/09/2014   COVID-19 Vaccine (3 - Moderna risk series) 08/21/2019   INFLUENZA VACCINE  11/23/2020   MAMMOGRAM  01/15/2021   DEXA SCAN  01/15/2022   TETANUS/TDAP  01/08/2029   Hepatitis C Screening  Completed   PNA vac Low Risk Adult  Completed   HPV VACCINES  Aged Out    Health Maintenance  Health Maintenance Due  Topic Date Due   Zoster Vaccines- Shingrix (1 of 2) Never done   COLONOSCOPY (Pts 45-39yrs Insurance coverage will need to be confirmed)  04/09/2014  COVID-19 Vaccine (3 - Moderna risk series) 08/21/2019    Colorectal cancer screening: No longer required.  (Patient declined)  Mammogram status: Completed 01/16/2020. Repeat every year  Bone Density status: Completed 01/16/2020. Results reflect: Bone density results: OSTEOPENIA. Repeat every 2 years.  Lung Cancer Screening: (Low Dose CT Chest recommended if Age 28-80 years, 30 pack-year currently smoking OR have quit w/in 15years.) does not qualify.   Lung Cancer Screening Referral: no  Additional Screening:  Hepatitis C Screening: does qualify; Completed yes  Vision Screening: Recommended annual  ophthalmology exams for early detection of glaucoma and other disorders of the eye. Is the patient up to date with their annual eye exam?  Yes  Who is the provider or what is the name of the office in which the patient attends annual eye exams? [facility] If pt is not established with a provider, would they like to be referred to a provider to establish care? No .   Dental Screening: Recommended annual dental exams for proper oral hygiene  Community Resource Referral / Chronic Care Management: CRR required this visit?  No   CCM required this visit?  No      Plan:     I have personally reviewed and noted the following in the patient's chart:   Medical and social history Use of alcohol, tobacco or illicit drugs  Current medications and supplements including opioid prescriptions.  Functional ability and status Nutritional status Physical activity Advanced directives List of other physicians Hospitalizations, surgeries, and ER visits in previous 12 months Vitals Screenings to include cognitive, depression, and falls Referrals and appointments  In addition, I have reviewed and discussed with patient certain preventive protocols, quality metrics, and best practice recommendations. A written personalized care plan for preventive services as well as general preventive health recommendations were provided to patient.     Sheral Flow, LPN   10/16/7626   Nurse Notes: n/a

## 2020-10-27 ENCOUNTER — Encounter: Payer: Medicare Other | Attending: Physical Medicine & Rehabilitation | Admitting: Registered Nurse

## 2020-10-27 ENCOUNTER — Encounter: Payer: Self-pay | Admitting: Registered Nurse

## 2020-10-27 ENCOUNTER — Other Ambulatory Visit: Payer: Self-pay

## 2020-10-27 VITALS — BP 137/75 | HR 84 | Temp 98.0°F | Ht 60.0 in | Wt 159.0 lb

## 2020-10-27 DIAGNOSIS — M961 Postlaminectomy syndrome, not elsewhere classified: Secondary | ICD-10-CM | POA: Insufficient documentation

## 2020-10-27 DIAGNOSIS — M24551 Contracture, right hip: Secondary | ICD-10-CM | POA: Diagnosis not present

## 2020-10-27 DIAGNOSIS — G894 Chronic pain syndrome: Secondary | ICD-10-CM | POA: Diagnosis not present

## 2020-10-27 DIAGNOSIS — Z5181 Encounter for therapeutic drug level monitoring: Secondary | ICD-10-CM | POA: Insufficient documentation

## 2020-10-27 DIAGNOSIS — Z79891 Long term (current) use of opiate analgesic: Secondary | ICD-10-CM

## 2020-10-27 DIAGNOSIS — M48062 Spinal stenosis, lumbar region with neurogenic claudication: Secondary | ICD-10-CM | POA: Diagnosis not present

## 2020-10-27 MED ORDER — HYDROCODONE-ACETAMINOPHEN 10-325 MG PO TABS: 1.0000 | ORAL_TABLET | Freq: Three times a day (TID) | ORAL | 0 refills | Status: DC | PRN

## 2020-10-27 NOTE — Progress Notes (Signed)
Subjective:    Patient ID: Hannah Scott, female    DOB: [birth year], 70 y.o.   MRN: [id number]  HPI: Hannah Scott is a 70 y.o. female who returns for follow up appointment for chronic pain and medication refill. She states her pain is located in her lower back. She rates her pain 7. Her current exercise regime is walking with her walker and performing chair exercises occasionally .   Hannah Scott Morphine equivalent is 31.11  MME.   Oral Swab was Performed today.    Pain Inventory Average Pain 7 Pain Right Now 7 My pain is constant, burning, tingling, and aching  In the last 24 hours, has pain interfered with the following? General activity 10 Relation with others 10 Enjoyment of life 10 What TIME of day is your pain at its worst? morning , daytime, evening, and night Sleep (in general) Poor  Pain is worse with: walking and standing Pain improves with: rest and medication Relief from Meds: 5  Family History  Problem Relation Age of Onset   Heart failure Mother    Pneumonia Mother    Hypertension Mother    Heart disease Mother    Stroke Maternal Grandfather    Hypertension Maternal Grandfather    Heart attack Father    Heart disease Father    Asthma Paternal Uncle        PAT UNCLES   Heart attack Paternal Uncle    Social History   Socioeconomic History   Marital status: Widowed    Spouse name: Not on file   Number of children: Not on file   Years of education: Not on file   Highest education level: Not on file  Occupational History   Not on file  Tobacco Use   Smoking status: Never   Smokeless tobacco: Never  Vaping Use   Vaping Use: Never used  Substance and Sexual Activity   Alcohol use: No    Alcohol/week: 0.0 standard drinks   Drug use: No   Sexual activity: Never    Birth control/protection: Surgical  Other Topics Concern   Not on file  Social History Narrative   Not on file   Social Determinants of Health   Financial Resource Strain: Low Risk     Difficulty of Paying Living Expenses: Not hard at all  Food Insecurity: No Food Insecurity   Worried About Charity fundraiser in the Last Year: Never true   [place] in the Last Year: Never true  Transportation Needs: No Transportation Needs   Lack of Transportation (Medical): No   Lack of Transportation (Non-Medical): No  Physical Activity: Inactive   Days of Exercise per Week: 0 days   Minutes of Exercise per Session: 0 min  Stress: No Stress Concern Present   Feeling of Stress : Not at all  Social Connections: Moderately Integrated   Frequency of Communication with Friends and Family: More than three times a week   Frequency of Social Gatherings with Friends and Family: More than three times a week   Attends Religious Services: More than 4 times per year   Active Member of Clubs or Organizations: No   Attends Music therapist: More than 4 times per year   Marital Status: Widowed   Past Surgical History:  Procedure Laterality Date   ANKLE SURGERY Left    ligament   APPENDECTOMY  1987   AT [facility]   BACK SURGERY     Fusion  BREAST LUMPECTOMY  2003   radiation on right   BREAST LUMPECTOMY WITH RADIOACTIVE SEED LOCALIZATION Left 02/12/2016   Procedure: LEFT BREAST LUMPECTOMY WITH RADIOACTIVE SEED LOCALIZATION;  Surgeon: Excell Seltzer, MD;  Location: [facility];  Service: General;  Laterality: Left;   CARDIOVASCULAR STRESS TEST  09/07/05   Nuclear, was negative   CATARACT EXTRACTION Bilateral 01,03   OOPHORECTOMY     LSO -RSO   PELVIC LAPAROSCOPY  1989   RSO, LYSIS OF ADHESIONS   TOTAL ABDOMINAL HYSTERECTOMY  1987   LS[facility], APPENDECTOMY   TOTAL HIP ARTHROPLASTY Right 10/28/2013   dr Lorin Mercy   TOTAL HIP ARTHROPLASTY Right 10/28/2013   Procedure: TOTAL HIP ARTHROPLASTY ANTERIOR APPROACH;  Surgeon: Marybelle Killings, MD;  Location: [place] [facility];  Service: Orthopedics;  Laterality: Right;  Right Total Hip Arthroplasty, Direct Anterior Approach   Past Surgical  History:  Procedure Laterality Date   ANKLE SURGERY Left    ligament   APPENDECTOMY  1987   AT [facility]   BACK SURGERY     Fusion   BREAST LUMPECTOMY  2003   radiation on right   BREAST LUMPECTOMY WITH RADIOACTIVE SEED LOCALIZATION Left 02/12/2016   Procedure: LEFT BREAST LUMPECTOMY WITH RADIOACTIVE SEED LOCALIZATION;  Surgeon: Excell Seltzer, MD;  Location: [facility];  Service: General;  Laterality: Left;   CARDIOVASCULAR STRESS TEST  09/07/05   Nuclear, was negative   CATARACT EXTRACTION Bilateral 01,03   OOPHORECTOMY     LSO -RSO   PELVIC LAPAROSCOPY  1989   RSO, LYSIS OF ADHESIONS   TOTAL ABDOMINAL HYSTERECTOMY  1987   [facility], APPENDECTOMY   TOTAL HIP ARTHROPLASTY Right 10/28/2013   dr Lorin Mercy   TOTAL HIP ARTHROPLASTY Right 10/28/2013   Procedure: TOTAL HIP ARTHROPLASTY ANTERIOR APPROACH;  Surgeon: Marybelle Killings, MD;  Location: [facility];  Service: Orthopedics;  Laterality: Right;  Right Total Hip Arthroplasty, Direct Anterior Approach   Past Medical History:  Diagnosis Date   Anemia    hx   Arthritis    Asthma    PFTs, February, 2011, moderate obstructive disease with response to bronchodilators, normal lung volumes, moderate reduction in diffusing capacity   Atrial septal aneurysm    Echo, 2008-not noted on 13 echo   CAD (coronary artery disease)    90% distal LAD in the past  /   nuclear, 2008, no ischemia, ejection fraction 70%   Cancer ([place]) 2002   DUCTAL CIS--S/P LUMPECTOMY, RADIATION AND 6 WEEKS OF TAMOXIFEN   D-dimer, elevated    January, 2014   Depression    Ejection fraction    EF 60%, echo, October, 2008   Elevated CPK    January, 2014   Endometriosis 1989   RIGHT TUBE   Endometriosis 1987   LEFT TUBE/OVARY W FOCAL IN-SITU ENDOMETRIAL ADENOCARCINOMA   GERD (gastroesophageal reflux disease)    occ   H/O hiatal hernia    ?   Hyperlipidemia    Hypertension    Hypothyroidism    Patient has had in the past that she does not need treatment    Kyphoscoliosis    Obstructive airway disease ([place])    Pinched nerve    lower back   Shortness of breath    Echo 2/22: EF 60-65, no RWMA, mild LVH, GR 1  DD, GLS -24%, normal RVSF, mild MR, trivial AI, borderline dilation of aortic root (39 mm)   UTI (lower urinary tract infection)    BP 137/75  Pulse 84   Temp 98 F (36.7 C)   Ht 5' (1.524 m)   Wt 159 lb (72.1 kg)   SpO2 95%   BMI 31.05 kg/m   Opioid Risk Score:   Fall Risk Score:  `1  Depression screen PHQ 2/9  Depression screen [facility] 2/9 10/08/2020 09/24/2020 08/06/2020 07/21/2020 07/21/2020 04/03/2020 12/06/2019  Decreased Interest 2 2 1 1  0 3 3  Down, Depressed, Hopeless 2 2 1 1  - 3 2  PHQ - 2 Score 4 4 2 2  0 6 5  Altered sleeping - - 1 - - - -  Tired, decreased energy - - - - - - -  Change in appetite - - - - - - -  Feeling bad or failure about yourself  - - - - - - -  Trouble concentrating - - - - - - -  Moving slowly or fidgety/restless - - - - - - -  Suicidal thoughts - - - - - - -  PHQ-9 Score - - 3 - - - -  Difficult doing work/chores - - - - - - -  Some recent data might be hidden    Review of Systems  Musculoskeletal:  Positive for back pain and gait problem.  All other systems reviewed and are negative.     Objective:   Physical Exam Vitals and nursing note reviewed.  Constitutional:      Appearance: Normal appearance.  Cardiovascular:     Rate and Rhythm: Normal rate and regular rhythm.     Pulses: Normal pulses.     Heart sounds: Normal heart sounds.  Pulmonary:     Effort: Pulmonary effort is normal.     Breath sounds: Normal breath sounds.  Musculoskeletal:     Cervical back: Normal range of motion and neck supple.     Comments: Normal Muscle Bulk and Muscle Testing Reveals:  Upper Extremities: Full ROM and Muscle Strength 5/5 Lumbar Paraspinal Tenderness: L-4-L-5 Lower Extremities: Full ROM and Muscle Strength 5/5 Arises from Table Slowly  Antalgic Gait     Skin:    General: Skin is warm and  dry.  Neurological:     Mental Status: She is alert and oriented to person, place, and time.  Psychiatric:        Mood and Affect: Mood normal.        Behavior: Behavior normal.         Assessment & Plan:  1. Lumbar post laminectomy syndrome with severe kyphoscoliosis thoracolumbar spine, s/p lumbar fusion/  10/27/2020 Continue current medication regimen. Refilled: Hydrocodone 10/325mg  one tablet every 8 hours may take an extra tablet when pain is severe #100. We will continue the opioid monitoring program, this consists of regular clinic visits, examinations, urine drug screen, pill counts as well as use of [place] Controlled Substance Reporting system. A 12 month History has been reviewed on the [place] Controlled Substance Reporting System on 10/27/2020 2. Right Hip OA: S/P Right Hip Replacement 10/28/2013. 10/27/2020 3.Depression: Continue Cymbalta. Has Family Support and Friends.  Counseling with Doristine Bosworth. 10/27/2020. 4. Right Greater Trochanteric Tenderness: No complaints today. Continue to Monitor. Continue with Ice and Heat Therapy. 10/27/2020    F/U in 1 month

## 2020-10-29 ENCOUNTER — Ambulatory Visit: Payer: Medicare Other | Admitting: Internal Medicine

## 2020-10-29 ENCOUNTER — Other Ambulatory Visit: Payer: Self-pay

## 2020-10-29 ENCOUNTER — Encounter: Payer: Self-pay | Admitting: Internal Medicine

## 2020-10-29 DIAGNOSIS — J4530 Mild persistent asthma, uncomplicated: Secondary | ICD-10-CM

## 2020-10-29 DIAGNOSIS — I10 Essential (primary) hypertension: Secondary | ICD-10-CM | POA: Diagnosis not present

## 2020-10-29 DIAGNOSIS — H34832 Tributary (branch) retinal vein occlusion, left eye, with macular edema: Secondary | ICD-10-CM | POA: Diagnosis not present

## 2020-10-29 MED ORDER — PREDNISONE 10 MG PO TABS: ORAL_TABLET | ORAL | 0 refills | Status: DC

## 2020-10-29 MED ORDER — BISOPROLOL FUMARATE 5 MG PO TABS: 5.0000 mg | ORAL_TABLET | Freq: Every day | ORAL | 11 refills | Status: DC

## 2020-10-29 NOTE — Progress Notes (Signed)
Hannah Scott, female    DOB: April 24, 1951    MRN: [id number]   Brief patient profile:  72 yowf never smoker with asthma as infant colds always went to chest with months no symptoms between flares then around 2012 started having spells bad enough to be admitted and requiring daily treatment since ? 2015 while on ACEi but having daily symptoms refractory to BREO so referred to pulmonary clinic 09/01/2020 by Dr Johney Frame.         History of Present Illness  09/01/2020  Pulmonary/ 1st office eval/[facility]  Chief Complaint  Patient presents with   Follow-up   Consult    SOB with exertion, worse over the past 2-3 months. Using albuterol 3x a day. Not using duoneb. Likes breo   Dyspnea:  Can't walk a grocery store s struggle x 3 months Cough: esp in am congestion, sometimes gagging with bad reflux  Sleep: in recliner x years x 45 degrees due to back  SABA use: 3 x daily  Rec Stop lotensin and breo Start ibesartan 150 mg one daily  - ok to take just one half if too strong Prednisone 10 mg take  4 each am x 2 days,   2 each am x 2 days,  1 each am x 2 days and stop   Plan A = Automatic = Always=    Dulera 100  Take 2 puffs first thing in am and then another 2 puffs about 12 hours later.  Work on inhaler technique:   If can't master symbicort go back to [facility] one click daily  Plan B = Backup (to supplement plan A, not to replace it) Only use your albuterol inhaler as a rescue medication Plan C = Crisis (instead of Plan B but only if Plan B stops working) - only use your albuterol nebulizer if you first try Plan B and it fails to help > ok to use the nebulizer up to every 4 hours but if start needing it regularly call for immediate appointment Pantoprazole (protonix) 40 mg   Take  30-60 min before first meal of the day and Pepcid (famotidine)  20 mg after supper until return to office  GERD  diet  - Allergy profile  09/01/20 >  Eos 0.4 /  IgE  1222  >  referred to allergy     10/29/2020  f/u ov/Shivaan Tierno  re:  chronic asthma/ atopy/ kyphoscoliosis - no appt for allergy in epic (declines referral at this point, says we just didn't give her enough pred last ov)  Chief Complaint  Patient presents with   Follow-up    Breathing was better until finished pred taper. She states having less acid heartburn. She is using her albuterol inhaler at least once per day.    Dyspnea:  worse since finished prednisone but much better while on it  Cough: better than it was / assoc nasal drainage  Sleeping: no resp cc noct/45 degrees due to back SABA use: about once a day  02: none  Covid status:   vax x 4    No obvious day to day or daytime variability or assoc excess/ purulent sputum or mucus plugs or hemoptysis or cp or chest tightness, subjective wheeze or overt sinus or hb symptoms.   Sleeping  without nocturnal  or  of respiratory  c/o's or need for noct saba. Also denies any obvious fluctuation of symptoms with weather or environmental changes or other aggravating or alleviating factors except as outlined above  No unusual exposure hx or h/o childhood pna or knowledge of premature birth.  Current Allergies, Complete Past Medical History, Past Surgical History, Family History, and Social History were reviewed in [employer] record.  ROS  The following are not active complaints unless bolded Hoarseness, sore throat, dysphagia, dental problems, itching, sneezing,  nasal congestion or discharge of excess mucus or purulent secretions, ear ache,   fever, chills, sweats, unintended wt loss or wt gain, classically pleuritic or exertional cp,  orthopnea pnd or arm/hand swelling  or leg swelling, presyncope, palpitations, abdominal pain, anorexia, nausea, vomiting, diarrhea  or change in bowel habits or change in bladder habits, change in stools or change in urine, dysuria, hematuria,  rash, arthralgias, visual complaints, headache, numbness, weakness or ataxia or problems with walking or  coordination,  change in mood or  memory.        Current Meds  Medication Sig   albuterol (VENTOLIN HFA) 108 (90 Base) MCG/ACT inhaler Inhale 2 puffs into the lungs every 4 (four) hours as needed for wheezing or shortness of breath.   aspirin EC 81 MG tablet Take 1 tablet (81 mg total) by mouth daily. Swallow whole.   atorvastatin (LIPITOR) 20 MG tablet Take 1 tablet (20 mg total) by mouth daily.   DULoxetine (CYMBALTA) 30 MG capsule Take 1 capsule (30 mg total) by mouth daily. Take in addition to 60 mg for a total of 90 mg daily   DULoxetine (CYMBALTA) 60 MG capsule TAKE ONE CAPSULE BY MOUTH DAILY   famotidine (PEPCID) 20 MG tablet One after supper   gabapentin (NEURONTIN) 600 MG tablet TAKE ONE TABLET EVERY DAY AT BEDTIME   HYDROcodone-acetaminophen (NORCO) 10-325 MG tablet Take 1 tablet by mouth every 8 (eight) hours as needed.   ipratropium-albuterol (DUONEB) 0.5-2.5 (3) MG/3ML SOLN Take 3 mLs by nebulization every 4 (four) hours as needed (wheezing or SOB). During exacerbation   irbesartan (AVAPRO) 150 MG tablet Take 1 tablet (150 mg total) by mouth daily.   levothyroxine (SYNTHROID) 75 MCG tablet Take 1 tablet (75 mcg total) by mouth daily.   metoprolol tartrate (LOPRESSOR) 50 MG tablet TAKE 2 TABLETS BY MOUTH TWICE DAILY   mometasone-formoterol (DULERA) 100-5 MCG/ACT AERO Take 2 puffs first thing in am and then another 2 puffs about 12 hours later.   pantoprazole (PROTONIX) 40 MG tablet Take 1 tablet (40 mg total) by mouth daily. Take 30-60 min before first meal of the day                        Past Medical History:  Diagnosis Date   Anemia    hx   Arthritis    Asthma    PFTs, February, 2011, moderate obstructive disease with response to bronchodilators, normal lung volumes, moderate reduction in diffusing capacity   Atrial septal aneurysm    Echo, 2008-not noted on 13 echo   CAD (coronary artery disease)    90% distal LAD in the past  /   nuclear, 2008, no ischemia,  ejection fraction 70%   Cancer ([place]) 2002   DUCTAL CIS--S/P LUMPECTOMY, RADIATION AND 6 WEEKS OF TAMOXIFEN   D-dimer, elevated    January, 2014   Depression    Ejection fraction    EF 60%, echo, October, 2008   Elevated CPK    January, 2014   Endometriosis 1989   RIGHT TUBE   Endometriosis 1987   LEFT TUBE/OVARY W FOCAL IN-SITU ENDOMETRIAL ADENOCARCINOMA  GERD (gastroesophageal reflux disease)    occ   H/O hiatal hernia    ?   Hyperlipidemia    Hypertension    Hypothyroidism    Patient has had in the past that she does not need treatment   Kyphoscoliosis    Obstructive airway disease ([place])    Pinched nerve    lower back   Shortness of breath    Echo 2/22: EF 60-65, no RWMA, mild LVH, GR 1  DD, GLS -24%, normal RVSF, mild MR, trivial AI, borderline dilation of aortic root (39 mm)   UTI (lower urinary tract infection)        Objective:       Wt Readings from Last 3 Encounters:  10/29/20 160 lb 9.6 oz (72.8 kg)  10/27/20 159 lb (72.1 kg)  10/08/20 155 lb (70.3 kg)      Vital signs reviewed  10/29/2020  - Note at rest 02 sats  91% on RA   General appearance:    somber amb wf nad      HEENT : pt wearing mask not removed for exam due to covid -19 concerns.    NECK :  without JVD/Nodes/TM/ nl carotid upstrokes bilaterally   LUNGS: no acc muscle use,  mod severe kyphoscoliotic contour chest which is clear to A and P bilaterally without cough on insp or exp maneuvers   CV:  RRR  no s3 or murmur or increase in P2, and no edema   ABD:  soft and nontender with nl inspiratory excursion in the supine position. No bruits or organomegaly appreciated, bowel sounds nl  MS:  Nl gait/ ext warm without deformities, calf tenderness, cyanosis or clubbing No obvious joint restrictions   SKIN: warm and dry without lesions    NEURO:  alert, approp, nl sensorium with  no motor or cerebellar deficits apparent.      I personally reviewed images and agree with radiology  impression as follows:  CXR:   09/01/20 Scarring left base. No edema or airspace opacity. Stable cardiac silhouette. Hiatal hernia present. Marked scoliosis again noted.       Assessment

## 2020-10-29 NOTE — Assessment & Plan Note (Addendum)
Onset: @ 25 mos old; improvement in teens Triggers (environmental, infectious, allergic):RTI Rescue inhaler BVQ:[id number] rarely response:QVAR 2 puffs bid Smoking history:never Family history pulmonary disease: asthma in paternal family  PFTs, February, 2011, moderate obstructive disease with response to bronchodilators, normal lung volumes, moderate reduction in diffusing capacity March 2017 pulmonary function testing consistent with severe airflow obstruction, ratio 56%, FEV1 0.77 L  - 09/01/2020  After extensive coaching inhaler device,  effectiveness =   50% from a baseline of 25 % > try Dulera 100 2bid and d/c breo due to hoarseness  - Allergy profile  09/01/20 >  Eos 0.4 /  IgE  1222  - 10/29/2020  After extensive coaching inhaler device,  effectiveness = 50% effective    > continue dulera 100 2bid but work on technique   Improved just while on prednisone indicates eos bronchitis / steroid responsive asthma and probably would do better if can learn hfa o/w back to DPI or change to neb.  At this point though she declines allergy referral and requesting at least 10 days of prednisone "like she used to get"  soRec:  Take 4 for three days 3 for three days 2 for three days 1 for three days and stop   Also should do better on more selective BB (see separate a/p)

## 2020-10-29 NOTE — Assessment & Plan Note (Signed)
Changed acei to ARB 09/01/2020 due to "asthma flare" with clear exam - changed toprol 100 bid to bisoprolol 5 mg  Once or twice daily 10/29/2020  (self monitoring)   Re acei: Although even in retrospect it may not be clear the ACEi contributed to the pt's symptoms,  adding them back at this point or in the future would risk confusion in interpretation of non-specific respiratory symptoms to which this patient is prone  ie  Better not to muddy the waters here.   Re BB In the setting of respiratory symptoms of unknown etiology,  It would be preferable to use bystolic, the most beta -1  selective Beta blocker available in sample form, with bisoprolol the most selective generic choice  on the market, at least on a trial basis, to make sure the spillover Beta 2 effects of the less specific Beta blockers are not contributing to this patient's symptoms.          Each maintenance medication was reviewed in detail including emphasizing most importantly the difference between maintenance and prns and under what circumstances the prns are to be triggered using an action plan format where appropriate.  Total time for H and P, chart review, counseling, reviewing hfa device(s) and generating customized AVS unique to this office visit / same day charting > 30 min

## 2020-10-29 NOTE — Patient Instructions (Addendum)
Prednisone 10 Take 4 for three days 3 for three days 2 for three days 1 for three days and stop   Dulera 100 Take 2 puffs first thing in am and then another 2 puffs about 12 hours later.    Work on inhaler technique:  relax and gently blow all the way out then take a nice smooth full deep breath back in, triggering the inhaler at same time you start breathing in.  Hold for up to 5 seconds if you can. Blow out thru nose. Rinse and gargle with water when done.  If mouth or throat bother you at all,  try brushing teeth/gums/tongue with arm and hammer toothpaste/ make a slurry and gargle and spit out.   Try avapro (ibesartan) 150 mg daily  and stop metaprolol  Bisoprolol 5 mg daily if blood pressure is too high take it twice daily  Pulmonary follow up is as needed - if not doing better return with all medications

## 2020-11-02 ENCOUNTER — Other Ambulatory Visit: Payer: Self-pay | Admitting: Internal Medicine

## 2020-11-03 LAB — DRUG TOX MONITOR 1 W/CONF, ORAL FLD
Amphetamines: NEGATIVE ng/mL (ref <10)
Barbiturates: NEGATIVE ng/mL (ref <10)
Benzodiazepines: NEGATIVE ng/mL (ref <0.50)
Buprenorphine: NEGATIVE ng/mL (ref <0.10)
Cocaine: NEGATIVE ng/mL (ref <5.0)
Codeine: NEGATIVE ng/mL (ref <2.5)
Dihydrocodeine: 26.3 ng/mL — ABNORMAL HIGH (ref <2.5)
Fentanyl: NEGATIVE ng/mL (ref <0.10)
Heroin Metabolite: NEGATIVE ng/mL (ref <1.0)
Hydrocodone: 165.9 ng/mL — ABNORMAL HIGH (ref <2.5)
Hydromorphone: NEGATIVE ng/mL (ref <2.5)
MARIJUANA: NEGATIVE ng/mL (ref <2.5)
MDMA: NEGATIVE ng/mL (ref <10)
Meprobamate: NEGATIVE ng/mL (ref <2.5)
Methadone: NEGATIVE ng/mL (ref <5.0)
Morphine: NEGATIVE ng/mL (ref <2.5)
Nicotine Metabolite: NEGATIVE ng/mL (ref <5.0)
Norhydrocodone: 5.3 ng/mL — ABNORMAL HIGH (ref <2.5)
Noroxycodone: NEGATIVE ng/mL (ref <2.5)
Opiates: POSITIVE ng/mL — AB (ref <2.5)
Oxycodone: NEGATIVE ng/mL (ref <2.5)
Oxymorphone: NEGATIVE ng/mL (ref <2.5)
Phencyclidine: NEGATIVE ng/mL (ref <10)
Tapentadol: NEGATIVE ng/mL (ref <5.0)
Tramadol: NEGATIVE ng/mL (ref <5.0)
Zolpidem: NEGATIVE ng/mL (ref <5.0)

## 2020-11-03 LAB — DRUG TOX ALC METAB W/CON, ORAL FLD: Alcohol Metabolite: NEGATIVE ng/mL (ref <25)

## 2020-11-08 NOTE — Progress Notes (Signed)
Cardiology Office Note:    Date:  11/10/2020   ID:  Hannah Scott, DOB 1950-11-08, MRN [id number]  PCP:  Binnie Rail, MD   [place]  Cardiologist:  Ena Dawley, MD (Inactive)  Advanced Practice Provider:  No care team member to display Electrophysiologist:  None   Referring MD: Binnie Rail, MD    History of Present Illness:    Hannah Scott is a 70 y.o. female with a hx of prior MI (1991, 75) secondary to coronary vasospasm, known distal LAD disease, HTN, HLD, atrial septal aneurysm, and asthma who was previously followed by Dr. Meda Coffee who presents to clinic for follow-up.   Saw Richardson Dopp on 06/05/20 where she was continuing to have shortness of breath with minimal activity over the past several months as well as chest/neck discomfort. Concerned for angina given history of vasospasm and known distal LAD disease in th past. Myoview 12/2019 with no ischemia; low risk. BNP on that visit 255.   Saw me on 06/19/20 where she continued to have SOB. TTE reassuringly normal with LVEF 60-65%, no WMA, no significant valve disease, RAP 3. Her amlodipine was stopped due to episodes of hypotension.   Last seen in clinic on 08/06/20 where she was continuing to have SOB that was worse with allergies. Saw pulm 10/29/20 who determined to continue to hold ACE and only use bystolic as BB. Was placed on prednisone at that time.  Today, the patient states that she has overall been doing okay. Blood pressure has been fluctuating with her medication adjustments, but improving with most recent adjustments. Currently on bystolic 10mg  daily and irbesartan 75mg  daily. Otherwise, her breathing is improved since starting the new inhalers and prednisone. No chest pain, lower extremity edema, orthopnea or PND. Occasional lightheadedness, but this has improved since stopping the HCTZ and amlodipine.   Past Medical History:  Diagnosis Date   Anemia    hx   Arthritis    Asthma     PFTs, February, 2011, moderate obstructive disease with response to bronchodilators, normal lung volumes, moderate reduction in diffusing capacity   Atrial septal aneurysm    Echo, 2008-not noted on 13 echo   CAD (coronary artery disease)    90% distal LAD in the past  /   nuclear, 2008, no ischemia, ejection fraction 70%   Cancer ([place]) 2002   DUCTAL CIS--S/P LUMPECTOMY, RADIATION AND 6 WEEKS OF TAMOXIFEN   D-dimer, elevated    January, 2014   Depression    Ejection fraction    EF 60%, echo, October, 2008   Elevated CPK    January, 2014   Endometriosis 1989   RIGHT TUBE   Endometriosis 1987   LEFT TUBE/OVARY W FOCAL IN-SITU ENDOMETRIAL ADENOCARCINOMA   GERD (gastroesophageal reflux disease)    occ   H/O hiatal hernia    ?   Hyperlipidemia    Hypertension    Hypothyroidism    Patient has had in the past that she does not need treatment   Kyphoscoliosis    Obstructive airway disease ([place])    Pinched nerve    lower back   Shortness of breath    Echo 2/22: EF 60-65, no RWMA, mild LVH, GR 1  DD, GLS -24%, normal RVSF, mild MR, trivial AI, borderline dilation of aortic root (39 mm)   UTI (lower urinary tract infection)     Past Surgical History:  Procedure Laterality Date   ANKLE SURGERY Left  ligament   APPENDECTOMY  1987   AT [facility]   BACK SURGERY     Fusion   BREAST LUMPECTOMY  2003   radiation on right   BREAST LUMPECTOMY WITH RADIOACTIVE SEED LOCALIZATION Left 02/12/2016   Procedure: LEFT BREAST LUMPECTOMY WITH RADIOACTIVE SEED LOCALIZATION;  Surgeon: Excell Seltzer, MD;  Location: [place];  Service: General;  Laterality: Left;   CARDIOVASCULAR STRESS TEST  09/07/05   Nuclear, was negative   CATARACT EXTRACTION Bilateral 01,03   OOPHORECTOMY     LSO -RSO   PELVIC LAPAROSCOPY  1989   RSO, LYSIS OF ADHESIONS   TOTAL ABDOMINAL HYSTERECTOMY  1987   LSO, APPENDECTOMY   TOTAL HIP ARTHROPLASTY Right 10/28/2013   dr Lorin Mercy   TOTAL HIP ARTHROPLASTY  Right 10/28/2013   Procedure: TOTAL HIP ARTHROPLASTY ANTERIOR APPROACH;  Surgeon: Marybelle Killings, MD;  Location: [place];  Service: Orthopedics;  Laterality: Right;  Right Total Hip Arthroplasty, Direct Anterior Approach    Current Medications: Current Meds  Medication Sig   albuterol (VENTOLIN HFA) 108 (90 Base) MCG/ACT inhaler Inhale 2 puffs into the lungs every 4 (four) hours as needed for wheezing or shortness of breath.   aspirin EC 81 MG tablet Take 1 tablet (81 mg total) by mouth daily. Swallow whole.   atorvastatin (LIPITOR) 20 MG tablet Take 1 tablet (20 mg total) by mouth daily.   bisoprolol (ZEBETA) 5 MG tablet Take 10 mg by mouth daily.   DULoxetine (CYMBALTA) 30 MG capsule Take 1 capsule (30 mg total) by mouth daily. Take in addition to 60 mg for a total of 90 mg daily   DULoxetine (CYMBALTA) 60 MG capsule TAKE ONE CAPSULE BY MOUTH DAILY   famotidine (PEPCID) 20 MG tablet One after supper   gabapentin (NEURONTIN) 600 MG tablet TAKE ONE TABLET EVERY DAY AT BEDTIME   HYDROcodone-acetaminophen (NORCO) 10-325 MG tablet Take 1 tablet by mouth every 8 (eight) hours as needed.   ipratropium-albuterol (DUONEB) 0.5-2.5 (3) MG/3ML SOLN Take 3 mLs by nebulization every 4 (four) hours as needed (wheezing or SOB). During exacerbation   irbesartan (AVAPRO) 150 MG tablet Take 75 mg by mouth daily.   levothyroxine (SYNTHROID) 75 MCG tablet Take 1 tablet (75 mcg total) by mouth daily.   mometasone-formoterol (DULERA) 100-5 MCG/ACT AERO Take 2 puffs first thing in am and then another 2 puffs about 12 hours later.   pantoprazole (PROTONIX) 40 MG tablet Take 1 tablet (40 mg total) by mouth daily. Take 30-60 min before first meal of the day   predniSONE (DELTASONE) 10 MG tablet Take 4 for three days 3 for three days 2 for three days 1 for three days and stop     Allergies:   Fluticasone-salmeterol, Norvasc [amlodipine besylate], and Tape   Social History   Socioeconomic History   Marital status:  Widowed    Spouse name: Not on file   Number of children: Not on file   Years of education: Not on file   Highest education level: Not on file  Occupational History   Not on file  Tobacco Use   Smoking status: Never   Smokeless tobacco: Never  Vaping Use   Vaping Use: Never used  Substance and Sexual Activity   Alcohol use: No    Alcohol/week: 0.0 standard drinks   Drug use: No   Sexual activity: Never    Birth control/protection: Surgical  Other Topics Concern   Not on file  Social History Narrative  Not on file   Social Determinants of Health   Financial Resource Strain: Low Risk    Difficulty of Paying Living Expenses: Not hard at all  Food Insecurity: No Food Insecurity   Worried About Charity fundraiser in the Last Year: Never true   [place] in the Last Year: Never true  Transportation Needs: No Transportation Needs   Lack of Transportation (Medical): No   Lack of Transportation (Non-Medical): No  Physical Activity: Inactive   Days of Exercise per Week: 0 days   Minutes of Exercise per Session: 0 min  Stress: No Stress Concern Present   Feeling of Stress : Not at all  Social Connections: Moderately Integrated   Frequency of Communication with Friends and Family: More than three times a week   Frequency of Social Gatherings with Friends and Family: More than three times a week   Attends Religious Services: More than 4 times per year   Active Member of Clubs or Organizations: No   Attends Music therapist: More than 4 times per year   Marital Status: Widowed     Family History: The patient's  family history includes Asthma in her paternal uncle; Heart attack in her father and paternal uncle; Heart disease in her father and mother; Heart failure in her mother; Hypertension in her maternal grandfather and mother; Pneumonia in her mother; Stroke in her maternal grandfather.  ROS:   Please see the history of present illness.    Review of  Systems  Constitutional:  Positive for malaise/fatigue. Negative for fever and weight loss.  HENT:  Negative for hearing loss.   Eyes:  Negative for blurred vision and redness.  Respiratory:  Positive for shortness of breath.   Cardiovascular:  Negative for chest pain, palpitations, orthopnea, claudication, leg swelling and PND.  Gastrointestinal:  Negative for melena, nausea and vomiting.  Genitourinary:  Negative for dysuria and flank pain.  Musculoskeletal:  Positive for joint pain.  Neurological:  Negative for dizziness and loss of consciousness.  Endo/Heme/Allergies:  Negative for polydipsia.  Psychiatric/Behavioral:  Positive for depression.    EKGs/Labs/Other Studies Reviewed:    The following studies were reviewed today: TTE 06/22/19: IMPRESSIONS   1. Left ventricular ejection fraction, by estimation, is 60 to 65%. The  left ventricle has normal function. The left ventricle has no regional  wall motion abnormalities. There is mild left ventricular hypertrophy.  Left ventricular diastolic parameters  are consistent with Grade I diastolic dysfunction (impaired relaxation).  The average left ventricular global longitudinal strain is -24.0 %. The  global longitudinal strain is normal.   2. Right ventricular systolic function is normal. The right ventricular  size is normal. Tricuspid regurgitation signal is inadequate for assessing  PA pressure.   3. The mitral valve is normal in structure. Mild mitral valve  regurgitation. No evidence of mitral stenosis.   4. The aortic valve was not well visualized. Aortic valve regurgitation  is trivial. No aortic stenosis is present.   5. There is borderline dilatation of the aortic root, measuring 39 mm.   6. The inferior vena cava is normal in size with greater than 50%  respiratory variability, suggesting right atrial pressure of 3 mmHg.    TTE 2019: Study Conclusions   - Left ventricle: The cavity size was normal. Wall thickness was     increased in a pattern of moderate LVH. Systolic function was    normal. The estimated ejection fraction was in the range  of 55%    to 60%. Wall motion was normal; there were no regional wall    motion abnormalities. Doppler parameters are consistent with    abnormal left ventricular relaxation (grade 1 diastolic    dysfunction).  - Aortic valve: There was trivial regurgitation.  - Ascending aorta: The ascending aorta was mildly dilated.  - Mitral valve: Calcified annulus. Mildly thickened leaflets .   Impressions:   - Normal LV systolic function; moderate LVH; mild diastolic    dysfunction; trace AI; mildly dilated ascending aorta.    Myoview 12/2019: The left ventricular ejection fraction is normal (55-65%). Nuclear stress EF: 64%. No wall motion abnormalities There was no ST segment deviation noted during stress. The study is normal. This is a low risk study. There is no infarct or ischemia identified.    Recent Labs: 06/05/2020: NT-Pro BNP 255 07/13/2020: ALT 12; BUN 14; Creatinine, Ser 0.78; Potassium 4.8; Sodium 138; TSH 3.91 09/01/2020: Hemoglobin 13.8; Platelets 209.0  Recent Lipid Panel    Component Value Date/Time   CHOL 138 07/13/2020 1506   TRIG 77.0 07/13/2020 1506   HDL 50.80 07/13/2020 1506   CHOLHDL 3 07/13/2020 1506   VLDL 15.4 07/13/2020 1506   LDLCALC 72 07/13/2020 1506   LDLCALC 61 12/13/2019 1430   LDLDIRECT 154.8 02/20/2009 1116     Risk Assessment/Calculations:       Physical Exam:    VS:  BP (!) 146/88   Pulse 65   Ht 5' (1.524 m)   Wt 159 lb (72.1 kg)   SpO2 97%   BMI 31.05 kg/m     Wt Readings from Last 3 Encounters:  11/10/20 159 lb (72.1 kg)  10/29/20 160 lb 9.6 oz (72.8 kg)  10/27/20 159 lb (72.1 kg)     GEN:  Comfortable, NAD HEENT: Normal NECK: No JVD; No carotid bruits. Scoliosis present CARDIAC: RR, 2/6 systolic murmur. No rubs or gallops RESPIRATORY:  CTAB, no wheezes ABDOMEN: Soft, non-tender,  non-distended MUSCULOSKELETAL:  No edema; No deformity  SKIN: Warm and dry NEUROLOGIC:  Alert and oriented x 3 PSYCHIATRIC:  Normal affect   ASSESSMENT:    1. Coronary artery disease involving native coronary artery of native heart without angina pectoris   2. Shortness of breath   3. Intermittent asthma without complication, unspecified asthma severity   4. Essential hypertension   5. Obesity (BMI 30.0-34.9)   6. Mixed hyperlipidemia   7. Vasospasm ([place])     PLAN:    In order of problems listed above:  #CAD with distal LAD #Vasospasm: Myoview 12/2019 with no evidence of ischemia. TTE with normal BiV function, no significant valvular abnormalities. Patient has rare, intermittent neck tightness but this is non-exertional and only lasts a couple of seconds before abating. Was trialed on amlodipine for possible vasospasm, however, no significant change in symptoms and patient has noted more frequent episodes of hypotension. Given reassuring cardiac work-up, no further testing needed at this time.  -Cardiac work-up including myoview and TTE reassuringly normal -Continue ASA and statin -Metop changed to bystolic 10mg  daily as most beta 1 selective and less likely to exacerbate her asthma -Holding amlodipine due to episodes of hypotension at home   #DOE: #Asthma: Cardiac work-up including BNP, TTE and myoview reassuringly normal. Suspect symptoms may be related to underlying asthma as often flares during this time of year. Now followed by Pulm. -Management per pulm -Cardiac work-up including TTE, BNP and myoview reassuring -Continue inhalers   #HTN: Elevated today but better  controlled at home. Orthostasis significantly improved. -Stopped amlodipine given episodes of hypotension with dizziness at home -Stopped benzapril due to concern for contribution to cough -Continue irbesartan 75mg  daily -Metop changed to bystolic 10mg  daily due to more beta-1 selectivity -Monitor blood  pressures at home   #HLD: LDL 61. -Continue atorvastatin 20mg  daily  #Obesity: BMI 32. -Continue lifestyle modifications -Declined referral to weight loss clinic   Medication Adjustments/Labs and Tests Ordered: Current medicines are reviewed at length with the patient today.  Concerns regarding medicines are outlined above.  No orders of the defined types were placed in this encounter.  No orders of the defined types were placed in this encounter.   Patient Instructions  Medication Instructions:   Your physician recommends that you continue on your current medications as directed. Please refer to the Current Medication list given to you today.  *If you need a refill on your cardiac medications before your next appointment, please call your pharmacy*   Follow-Up: At [facility], you and your health needs are our priority.  As part of our continuing mission to provide you with exceptional heart care, we have created designated Provider Care Teams.  These Care Teams include your primary Cardiologist (physician) and Advanced Practice Providers (APPs -  Physician Assistants and Nurse Practitioners) who all work together to provide you with the care you need, when you need it.  We recommend signing up for the patient portal called "MyChart".  Sign up information is provided on this After Visit Summary.  MyChart is used to connect with patients for Virtual Visits (Telemedicine).  Patients are able to view lab/test results, encounter notes, upcoming appointments, etc.  Non-urgent messages can be sent to your provider as well.   To learn more about what you can do with MyChart, go to [email or web address].    Your next appointment:   6 month(s)  The format for your next appointment:   In Person  Provider:   Gwyndolyn Kaufman, MD      Signed, Freada Bergeron, MD  11/10/2020 3:38 PM    [place]

## 2020-11-10 ENCOUNTER — Ambulatory Visit: Payer: Medicare Other | Admitting: Registered Nurse

## 2020-11-10 ENCOUNTER — Other Ambulatory Visit: Payer: Self-pay

## 2020-11-10 ENCOUNTER — Ambulatory Visit: Payer: Medicare Other | Admitting: Cardiology

## 2020-11-10 ENCOUNTER — Encounter: Payer: Self-pay | Admitting: Cardiology

## 2020-11-10 VITALS — BP 146/88 | HR 65 | Ht 60.0 in | Wt 159.0 lb

## 2020-11-10 DIAGNOSIS — I2510 Atherosclerotic heart disease of native coronary artery without angina pectoris: Secondary | ICD-10-CM | POA: Diagnosis not present

## 2020-11-10 DIAGNOSIS — E66811 Obesity, class 1: Secondary | ICD-10-CM

## 2020-11-10 DIAGNOSIS — E782 Mixed hyperlipidemia: Secondary | ICD-10-CM

## 2020-11-10 DIAGNOSIS — J4520 Mild intermittent asthma, uncomplicated: Secondary | ICD-10-CM

## 2020-11-10 DIAGNOSIS — R0602 Shortness of breath: Secondary | ICD-10-CM | POA: Diagnosis not present

## 2020-11-10 DIAGNOSIS — I10 Essential (primary) hypertension: Secondary | ICD-10-CM

## 2020-11-10 DIAGNOSIS — I739 Peripheral vascular disease, unspecified: Secondary | ICD-10-CM

## 2020-11-10 DIAGNOSIS — E669 Obesity, unspecified: Secondary | ICD-10-CM

## 2020-11-10 NOTE — Patient Instructions (Signed)
Medication Instructions:   Your physician recommends that you continue on your current medications as directed. Please refer to the Current Medication list given to you today.  *If you need a refill on your cardiac medications before your next appointment, please call your pharmacy*   Follow-Up: At [facility], you and your health needs are our priority.  As part of our continuing mission to provide you with exceptional heart care, we have created designated Provider Care Teams.  These Care Teams include your primary Cardiologist (physician) and Advanced Practice Providers (APPs -  Physician Assistants and Nurse Practitioners) who all work together to provide you with the care you need, when you need it.  We recommend signing up for the patient portal called "MyChart".  Sign up information is provided on this After Visit Summary.  MyChart is used to connect with patients for Virtual Visits (Telemedicine).  Patients are able to view lab/test results, encounter notes, upcoming appointments, etc.  Non-urgent messages can be sent to your provider as well.   To learn more about what you can do with MyChart, go to [email or web address].    Your next appointment:   6 month(s)  The format for your next appointment:   In Person  Provider:   Heather Pemberton, MD     

## 2020-11-13 ENCOUNTER — Telehealth: Payer: Self-pay | Admitting: *Deleted

## 2020-11-13 NOTE — Telephone Encounter (Signed)
Oral swab drug screen was consistent for prescribed medications.  ?

## 2020-11-27 ENCOUNTER — Encounter: Payer: Self-pay | Admitting: Registered Nurse

## 2020-11-27 ENCOUNTER — Other Ambulatory Visit: Payer: Self-pay

## 2020-11-27 ENCOUNTER — Encounter: Payer: Medicare Other | Attending: Physical Medicine & Rehabilitation | Admitting: Registered Nurse

## 2020-11-27 VITALS — BP 123/78 | HR 84 | Temp 98.2°F | Ht 60.0 in | Wt 160.2 lb

## 2020-11-27 DIAGNOSIS — Z79891 Long term (current) use of opiate analgesic: Secondary | ICD-10-CM | POA: Insufficient documentation

## 2020-11-27 DIAGNOSIS — Z5181 Encounter for therapeutic drug level monitoring: Secondary | ICD-10-CM

## 2020-11-27 DIAGNOSIS — M24551 Contracture, right hip: Secondary | ICD-10-CM | POA: Diagnosis not present

## 2020-11-27 DIAGNOSIS — M48062 Spinal stenosis, lumbar region with neurogenic claudication: Secondary | ICD-10-CM | POA: Insufficient documentation

## 2020-11-27 DIAGNOSIS — M961 Postlaminectomy syndrome, not elsewhere classified: Secondary | ICD-10-CM

## 2020-11-27 DIAGNOSIS — G894 Chronic pain syndrome: Secondary | ICD-10-CM | POA: Diagnosis not present

## 2020-11-27 MED ORDER — HYDROCODONE-ACETAMINOPHEN 10-325 MG PO TABS: 1.0000 | ORAL_TABLET | Freq: Three times a day (TID) | ORAL | 0 refills | Status: DC | PRN

## 2020-11-27 NOTE — Progress Notes (Signed)
Subjective:    Patient ID: Hannah Scott, female    DOB: February 12, 1951, 70 y.o.   MRN: [id number]  HPI: Hannah Scott is a 70 y.o. female who returns for follow up appointment for chronic pain and medication refill. She states her pain is located in her lower back pain. She rates her pain 7. Her current exercise regime is walking and performing stretching exercises.   Hannah Scott Morphine equivalent is 33.33 MME.   Last Oral Swab was Performed on 10/27/2020, it was consistent.    Pain Inventory Average Pain 7 Pain Right Now 7 My pain is constant, burning, tingling, and aching  In the last 24 hours, has pain interfered with the following? General activity 10 Relation with others 10 Enjoyment of life 10 What TIME of day is your pain at its worst? morning , daytime, evening, and night Sleep (in general) Poor  Pain is worse with: walking and standing Pain improves with: rest and medication Relief from Meds: 6  Family History  Problem Relation Age of Onset   Heart failure Mother    Pneumonia Mother    Hypertension Mother    Heart disease Mother    Stroke Maternal Grandfather    Hypertension Maternal Grandfather    Heart attack Father    Heart disease Father    Asthma Paternal Uncle        PAT UNCLES   Heart attack Paternal Uncle    Social History   Socioeconomic History   Marital status: Widowed    Spouse name: Not on file   Number of children: Not on file   Years of education: Not on file   Highest education level: Not on file  Occupational History   Not on file  Tobacco Use   Smoking status: Never   Smokeless tobacco: Never  Vaping Use   Vaping Use: Never used  Substance and Sexual Activity   Alcohol use: No    Alcohol/week: 0.0 standard drinks   Drug use: No   Sexual activity: Never    Birth control/protection: Surgical  Other Topics Concern   Not on file  Social History Narrative   Not on file   Social Determinants of Health   Financial Resource Strain: Low  Risk    Difficulty of Paying Living Expenses: Not hard at all  Food Insecurity: No Food Insecurity   Worried About Charity fundraiser in the Last Year: Never true   [place] in the Last Year: Never true  Transportation Needs: No Transportation Needs   Lack of Transportation (Medical): No   Lack of Transportation (Non-Medical): No  Physical Activity: Inactive   Days of Exercise per Week: 0 days   Minutes of Exercise per Session: 0 min  Stress: No Stress Concern Present   Feeling of Stress : Not at all  Social Connections: Moderately Integrated   Frequency of Communication with Friends and Family: More than three times a week   Frequency of Social Gatherings with Friends and Family: More than three times a week   Attends Religious Services: More than 4 times per year   Active Member of Clubs or Organizations: No   Attends Music therapist: More than 4 times per year   Marital Status: Widowed   Past Surgical History:  Procedure Laterality Date   ANKLE SURGERY Left    ligament   APPENDECTOMY  1987   AT [facility]   BACK SURGERY     Fusion  BREAST LUMPECTOMY  2003   radiation on right   BREAST LUMPECTOMY WITH RADIOACTIVE SEED LOCALIZATION Left 02/12/2016   Procedure: LEFT BREAST LUMPECTOMY WITH RADIOACTIVE SEED LOCALIZATION;  Surgeon: Excell Seltzer, MD;  Location: [place];  Service: General;  Laterality: Left;   CARDIOVASCULAR STRESS TEST  09/07/05   Nuclear, was negative   CATARACT EXTRACTION Bilateral 01,03   OOPHORECTOMY     LSO -RSO   PELVIC LAPAROSCOPY  1989   RSO, LYSIS OF ADHESIONS   TOTAL ABDOMINAL HYSTERECTOMY  1987   LSO, APPENDECTOMY   TOTAL HIP ARTHROPLASTY Right 10/28/2013   dr Lorin Mercy   TOTAL HIP ARTHROPLASTY Right 10/28/2013   Procedure: TOTAL HIP ARTHROPLASTY ANTERIOR APPROACH;  Surgeon: Marybelle Killings, MD;  Location: [place];  Service: Orthopedics;  Laterality: Right;  Right Total Hip Arthroplasty, Direct Anterior Approach   Past  Surgical History:  Procedure Laterality Date   ANKLE SURGERY Left    ligament   APPENDECTOMY  1987   AT [facility]   BACK SURGERY     Fusion   BREAST LUMPECTOMY  2003   radiation on right   BREAST LUMPECTOMY WITH RADIOACTIVE SEED LOCALIZATION Left 02/12/2016   Procedure: LEFT BREAST LUMPECTOMY WITH RADIOACTIVE SEED LOCALIZATION;  Surgeon: Excell Seltzer, MD;  Location: [place];  Service: General;  Laterality: Left;   CARDIOVASCULAR STRESS TEST  09/07/05   Nuclear, was negative   CATARACT EXTRACTION Bilateral 01,03   OOPHORECTOMY     LSO -RSO   PELVIC LAPAROSCOPY  1989   RSO, LYSIS OF ADHESIONS   TOTAL ABDOMINAL HYSTERECTOMY  1987   LSO, APPENDECTOMY   TOTAL HIP ARTHROPLASTY Right 10/28/2013   dr Lorin Mercy   TOTAL HIP ARTHROPLASTY Right 10/28/2013   Procedure: TOTAL HIP ARTHROPLASTY ANTERIOR APPROACH;  Surgeon: Marybelle Killings, MD;  Location: [place];  Service: Orthopedics;  Laterality: Right;  Right Total Hip Arthroplasty, Direct Anterior Approach   Past Medical History:  Diagnosis Date   Anemia    hx   Arthritis    Asthma    PFTs, February, 2011, moderate obstructive disease with response to bronchodilators, normal lung volumes, moderate reduction in diffusing capacity   Atrial septal aneurysm    Echo, 2008-not noted on 13 echo   CAD (coronary artery disease)    90% distal LAD in the past  /   nuclear, 2008, no ischemia, ejection fraction 70%   Cancer ([place]) 2002   DUCTAL CIS--S/P LUMPECTOMY, RADIATION AND 6 WEEKS OF TAMOXIFEN   D-dimer, elevated    January, 2014   Depression    Ejection fraction    EF 60%, echo, October, 2008   Elevated CPK    January, 2014   Endometriosis 1989   RIGHT TUBE   Endometriosis 1987   LEFT TUBE/OVARY W FOCAL IN-SITU ENDOMETRIAL ADENOCARCINOMA   GERD (gastroesophageal reflux disease)    occ   H/O hiatal hernia    ?   Hyperlipidemia    Hypertension    Hypothyroidism    Patient has had in the past that she does not need treatment    Kyphoscoliosis    Obstructive airway disease ([place])    Pinched nerve    lower back   Shortness of breath    Echo 2/22: EF 60-65, no RWMA, mild LVH, GR 1  DD, GLS -24%, normal RVSF, mild MR, trivial AI, borderline dilation of aortic root (39 mm)   UTI (lower urinary tract infection)    There were no vitals  taken for this visit.  Opioid Risk Score:   Fall Risk Score:  `1  Depression screen PHQ 2/9  Depression screen [facility] 2/9 10/27/2020 10/08/2020 09/24/2020 08/06/2020 07/21/2020 07/21/2020 04/03/2020  Decreased Interest '1 2 2 1 1 '$ 0 3  Down, Depressed, Hopeless '1 2 2 1 1 '$ - 3  PHQ - 2 Score '2 4 4 2 2 '$ 0 6  Altered sleeping - - - 1 - - -  Tired, decreased energy - - - - - - -  Change in appetite - - - - - - -  Feeling bad or failure about yourself  - - - - - - -  Trouble concentrating - - - - - - -  Moving slowly or fidgety/restless - - - - - - -  Suicidal thoughts - - - - - - -  PHQ-9 Score - - - 3 - - -  Difficult doing work/chores - - - - - - -  Some recent data might be hidden    Review of Systems  Musculoskeletal:  Positive for back pain and gait problem.       Right arm pain & hand pain  All other systems reviewed and are negative.     Objective:   Physical Exam Vitals and nursing note reviewed.  Constitutional:      Appearance: Normal appearance.  Cardiovascular:     Rate and Rhythm: Normal rate and regular rhythm.     Pulses: Normal pulses.     Heart sounds: Normal heart sounds.  Pulmonary:     Effort: Pulmonary effort is normal.     Breath sounds: Normal breath sounds.  Musculoskeletal:     Cervical back: Normal range of motion and neck supple.     Comments: Normal Muscle Bulk and Muscle Testing Reveals:  Upper Extremities:Full  ROM and Muscle Strength 5/5 Lumbar Paraspinal Tenderness: L-4-L-5 Lower Extremities: Full ROM and Muscle Strength 5/5 Arises from Table slowly using walker for support Narrow Based  Gait     Skin:    General: Skin is warm and dry.   Neurological:     Mental Status: She is alert and oriented to person, place, and time.  Psychiatric:        Mood and Affect: Mood normal.        Behavior: Behavior normal.         Assessment & Plan:  1. Lumbar post laminectomy syndrome with severe kyphoscoliosis thoracolumbar spine, s/p lumbar fusion/  11/27/2020 Continue current medication regimen. Refilled: Hydrocodone 10/'325mg'$  one tablet every 8 hours may take an extra tablet when pain is severe #100. We will continue the opioid monitoring program, this consists of regular clinic visits, examinations, urine drug screen, pill counts as well as use of [place] Controlled Substance Reporting system. A 12 month History has been reviewed on the [place] Controlled Substance Reporting System on 11/27/2020 2. Right Hip OA: S/P Right Hip Replacement 10/28/2013. 11/27/2020 3.Depression: Continue Cymbalta. Has Family Support and Friends.  Counseling with Doristine Bosworth. 11/27/2020. 4. Right Greater Trochanteric Tenderness: No complaints today. Continue to Monitor. Continue with Ice and Heat Therapy. 11/27/2020    F/U in 1 month

## 2020-11-29 ENCOUNTER — Other Ambulatory Visit: Payer: Self-pay | Admitting: Internal Medicine

## 2020-11-29 DIAGNOSIS — R0600 Dyspnea, unspecified: Secondary | ICD-10-CM

## 2020-11-29 DIAGNOSIS — R0609 Other forms of dyspnea: Secondary | ICD-10-CM

## 2020-12-02 ENCOUNTER — Other Ambulatory Visit: Payer: Self-pay | Admitting: Internal Medicine

## 2020-12-03 DIAGNOSIS — H34832 Tributary (branch) retinal vein occlusion, left eye, with macular edema: Secondary | ICD-10-CM | POA: Diagnosis not present

## 2020-12-13 ENCOUNTER — Other Ambulatory Visit: Payer: Self-pay | Admitting: Internal Medicine

## 2020-12-14 DIAGNOSIS — H34832 Tributary (branch) retinal vein occlusion, left eye, with macular edema: Secondary | ICD-10-CM | POA: Diagnosis not present

## 2020-12-14 DIAGNOSIS — H35033 Hypertensive retinopathy, bilateral: Secondary | ICD-10-CM | POA: Diagnosis not present

## 2020-12-14 DIAGNOSIS — H3562 Retinal hemorrhage, left eye: Secondary | ICD-10-CM | POA: Diagnosis not present

## 2020-12-14 DIAGNOSIS — H3582 Retinal ischemia: Secondary | ICD-10-CM | POA: Diagnosis not present

## 2020-12-24 ENCOUNTER — Other Ambulatory Visit: Payer: Self-pay | Admitting: Internal Medicine

## 2020-12-24 DIAGNOSIS — H34832 Tributary (branch) retinal vein occlusion, left eye, with macular edema: Secondary | ICD-10-CM | POA: Diagnosis not present

## 2020-12-29 ENCOUNTER — Other Ambulatory Visit: Payer: Self-pay

## 2020-12-29 ENCOUNTER — Encounter: Payer: Medicare Other | Attending: Physical Medicine & Rehabilitation | Admitting: Registered Nurse

## 2020-12-29 ENCOUNTER — Encounter: Payer: Self-pay | Admitting: Registered Nurse

## 2020-12-29 VITALS — BP 111/66 | Ht 60.0 in | Wt 159.0 lb

## 2020-12-29 DIAGNOSIS — M24551 Contracture, right hip: Secondary | ICD-10-CM | POA: Diagnosis not present

## 2020-12-29 DIAGNOSIS — Z79891 Long term (current) use of opiate analgesic: Secondary | ICD-10-CM | POA: Insufficient documentation

## 2020-12-29 DIAGNOSIS — M961 Postlaminectomy syndrome, not elsewhere classified: Secondary | ICD-10-CM | POA: Insufficient documentation

## 2020-12-29 DIAGNOSIS — G894 Chronic pain syndrome: Secondary | ICD-10-CM | POA: Diagnosis not present

## 2020-12-29 DIAGNOSIS — M48062 Spinal stenosis, lumbar region with neurogenic claudication: Secondary | ICD-10-CM | POA: Insufficient documentation

## 2020-12-29 DIAGNOSIS — Z5181 Encounter for therapeutic drug level monitoring: Secondary | ICD-10-CM | POA: Insufficient documentation

## 2020-12-29 MED ORDER — HYDROCODONE-ACETAMINOPHEN 10-325 MG PO TABS: 1.0000 | ORAL_TABLET | Freq: Three times a day (TID) | ORAL | 0 refills | Status: DC | PRN

## 2020-12-29 NOTE — Progress Notes (Signed)
Subjective:    Patient ID: Hannah Scott, female    DOB: [birth year], 70 y.o.   MRN: [id number]  HPI: Hannah Scott is a 70 y.o. female whose appointment is a telephone visit, Hannah Scott has asthma and she reports she was having difficulty with the humidity and had Lase Eye surgery last week. Hannah Scott agrees with telephone visit and verbalizes understanding. She states her pain is located in  her lower back.She  rates her pain 6. Her current exercise regime is walking.   Hannah Scott Morphine equivalent is 33.33 MME.   Last Oral Swab was Performed on 10/27/2020. It was consistent.  Pain Inventory Average Pain 7 Pain Right Now 6 My pain is burning, tingling, and aching  In the last 24 hours, has pain interfered with the following? General activity 8 Relation with others 8 Enjoyment of life 8 What TIME of day is your pain at its worst? morning , daytime, evening, and night Sleep (in general) Poor  Pain is worse with: walking, standing, some activities  Pain improves with: rest and medication Relief from Meds: 6  Family History  Problem Relation Age of Onset   Heart failure Mother    Pneumonia Mother    Hypertension Mother    Heart disease Mother    Stroke Maternal Grandfather    Hypertension Maternal Grandfather    Heart attack Father    Heart disease Father    Asthma Paternal Uncle        PAT UNCLES   Heart attack Paternal Uncle    Social History   Socioeconomic History   Marital status: Widowed    Spouse name: Not on file   Number of children: Not on file   Years of education: Not on file   Highest education level: Not on file  Occupational History   Not on file  Tobacco Use   Smoking status: Never   Smokeless tobacco: Never  Vaping Use   Vaping Use: Never used  Substance and Sexual Activity   Alcohol use: No    Alcohol/week: 0.0 standard drinks   Drug use: No   Sexual activity: Never    Birth control/protection: Surgical  Other Topics Concern   Not on file  Social  History Narrative   Not on file   Social Determinants of Health   Financial Resource Strain: Low Risk    Difficulty of Paying Living Expenses: Not hard at all  Food Insecurity: No Food Insecurity   Worried About Charity fundraiser in the Last Year: Never true   [place] in the Last Year: Never true  Transportation Needs: No Transportation Needs   Lack of Transportation (Medical): No   Lack of Transportation (Non-Medical): No  Physical Activity: Inactive   Days of Exercise per Week: 0 days   Minutes of Exercise per Session: 0 min  Stress: No Stress Concern Present   Feeling of Stress : Not at all  Social Connections: Moderately Integrated   Frequency of Communication with Friends and Family: More than three times a week   Frequency of Social Gatherings with Friends and Family: More than three times a week   Attends Religious Services: More than 4 times per year   Active Member of Clubs or Organizations: No   Attends Music therapist: More than 4 times per year   Marital Status: Widowed   Past Surgical History:  Procedure Laterality Date   ANKLE SURGERY Left    ligament  APPENDECTOMY  1987   AT [facility]   BACK SURGERY     Fusion   BREAST LUMPECTOMY  2003   radiation on right   BREAST LUMPECTOMY WITH RADIOACTIVE SEED LOCALIZATION Left 02/12/2016   Procedure: LEFT BREAST LUMPECTOMY WITH RADIOACTIVE SEED LOCALIZATION;  Surgeon: Excell Seltzer, MD;  Location: [place];  Service: General;  Laterality: Left;   CARDIOVASCULAR STRESS TEST  09/07/05   Nuclear, was negative   CATARACT EXTRACTION Bilateral 01,03   OOPHORECTOMY     LSO -RSO   PELVIC LAPAROSCOPY  1989   RSO, LYSIS OF ADHESIONS   TOTAL ABDOMINAL HYSTERECTOMY  1987   LSO, APPENDECTOMY   TOTAL HIP ARTHROPLASTY Right 10/28/2013   dr Lorin Mercy   TOTAL HIP ARTHROPLASTY Right 10/28/2013   Procedure: TOTAL HIP ARTHROPLASTY ANTERIOR APPROACH;  Surgeon: Marybelle Killings, MD;  Location: [place];   Service: Orthopedics;  Laterality: Right;  Right Total Hip Arthroplasty, Direct Anterior Approach   Past Surgical History:  Procedure Laterality Date   ANKLE SURGERY Left    ligament   APPENDECTOMY  1987   AT [facility]   BACK SURGERY     Fusion   BREAST LUMPECTOMY  2003   radiation on right   BREAST LUMPECTOMY WITH RADIOACTIVE SEED LOCALIZATION Left 02/12/2016   Procedure: LEFT BREAST LUMPECTOMY WITH RADIOACTIVE SEED LOCALIZATION;  Surgeon: Excell Seltzer, MD;  Location: [place];  Service: General;  Laterality: Left;   CARDIOVASCULAR STRESS TEST  09/07/05   Nuclear, was negative   CATARACT EXTRACTION Bilateral 01,03   OOPHORECTOMY     LSO -RSO   PELVIC LAPAROSCOPY  1989   RSO, LYSIS OF ADHESIONS   TOTAL ABDOMINAL HYSTERECTOMY  1987   LSO, APPENDECTOMY   TOTAL HIP ARTHROPLASTY Right 10/28/2013   dr Lorin Mercy   TOTAL HIP ARTHROPLASTY Right 10/28/2013   Procedure: TOTAL HIP ARTHROPLASTY ANTERIOR APPROACH;  Surgeon: Marybelle Killings, MD;  Location: [place];  Service: Orthopedics;  Laterality: Right;  Right Total Hip Arthroplasty, Direct Anterior Approach   Past Medical History:  Diagnosis Date   Anemia    hx   Arthritis    Asthma    PFTs, February, 2011, moderate obstructive disease with response to bronchodilators, normal lung volumes, moderate reduction in diffusing capacity   Atrial septal aneurysm    Echo, 2008-not noted on 13 echo   CAD (coronary artery disease)    90% distal LAD in the past  /   nuclear, 2008, no ischemia, ejection fraction 70%   Cancer ([place]) 2002   DUCTAL CIS--S/P LUMPECTOMY, RADIATION AND 6 WEEKS OF TAMOXIFEN   D-dimer, elevated    January, 2014   Depression    Ejection fraction    EF 60%, echo, October, 2008   Elevated CPK    January, 2014   Endometriosis 1989   RIGHT TUBE   Endometriosis 1987   LEFT TUBE/OVARY W FOCAL IN-SITU ENDOMETRIAL ADENOCARCINOMA   GERD (gastroesophageal reflux disease)    occ   H/O hiatal hernia    ?    Hyperlipidemia    Hypertension    Hypothyroidism    Patient has had in the past that she does not need treatment   Kyphoscoliosis    Obstructive airway disease ([place])    Pinched nerve    lower back   Shortness of breath    Echo 2/22: EF 60-65, no RWMA, mild LVH, GR 1  DD, GLS -24%, normal RVSF, mild MR, trivial AI, borderline dilation of  aortic root (39 mm)   UTI (lower urinary tract infection)    BP 111/66 Comment: pt reported, virtual visit  Ht 5' (1.524 m)   Wt 159 lb (72.1 kg)   BMI 31.05 kg/m   Opioid Risk Score:   Fall Risk Score:  `1  Depression screen PHQ 2/9  Depression screen [facility] 2/9 11/27/2020 10/27/2020 10/08/2020 09/24/2020 08/06/2020 07/21/2020 07/21/2020  Decreased Interest '1 1 2 2 1 1 '$ 0  Down, Depressed, Hopeless '1 1 2 2 1 1 '$ -  PHQ - 2 Score '2 2 4 4 2 2 '$ 0  Altered sleeping - - - - 1 - -  Tired, decreased energy - - - - - - -  Change in appetite - - - - - - -  Feeling bad or failure about yourself  - - - - - - -  Trouble concentrating - - - - - - -  Moving slowly or fidgety/restless - - - - - - -  Suicidal thoughts - - - - - - -  PHQ-9 Score - - - - 3 - -  Difficult doing work/chores - - - - - - -  Some recent data might be hidden     Review of Systems  Musculoskeletal:  Positive for back pain.  Neurological:  Positive for weakness and numbness.  All other systems reviewed and are negative.     Objective:   Physical Exam Vitals and nursing note reviewed.  Musculoskeletal:     Comments: No Assessment Performed: Telephone visit         Assessment & Plan:  1. Lumbar post laminectomy syndrome with severe kyphoscoliosis thoracolumbar spine, s/p lumbar fusion/  12/29/2020 Continue current medication regimen. Refilled: Hydrocodone 10/'325mg'$  one tablet every 8 hours may take an extra tablet when pain is severe #100. We will continue the opioid monitoring program, this consists of regular clinic visits, examinations, urine drug screen, pill counts as well as use of  [place] Controlled Substance Reporting system. A 12 month History has been reviewed on the [place] Controlled Substance Reporting System on 12/29/2020 2. Right Hip OA: S/P Right Hip Replacement 10/28/2013. 12/29/2020 3.Depression: Continue Cymbalta. Has Family Support and Friends.  Counseling with Doristine Bosworth. 12/29/2020. 4. Right Greater Trochanteric Tenderness: No complaints today. Continue to Monitor. Continue with Ice and Heat Therapy. 12/29/2020    F/U in 1 month  Telephone Visit Established Patient Location of Patient: In Her Home Location of Provider: In the Office Total Time Spent: 10 Minutes

## 2021-01-04 DIAGNOSIS — H34832 Tributary (branch) retinal vein occlusion, left eye, with macular edema: Secondary | ICD-10-CM | POA: Diagnosis not present

## 2021-01-10 NOTE — Patient Instructions (Addendum)
  Blood work was ordered.     Medications changes include :   Breo instead of Dulera  Your prescription(s) have been submitted to your pharmacy. Please take as directed and contact our office if you believe you are having problem(s) with the medication(s).    Please followup in 6 months

## 2021-01-10 NOTE — Progress Notes (Signed)
Subjective:    Patient ID: Hannah Scott, female    DOB: 1950-07-16, 70 y.o.   MRN: [id number]  This visit occurred during the SARS-CoV-2 public health emergency.  Safety protocols were in place, including screening questions prior to the visit, additional usage of staff PPE, and extensive cleaning of exam room while observing appropriate contact time as indicated for disinfecting solutions.     HPI The patient is here for follow up of their chronic medical problems, including htn, prediabetes, hichol, hypothyroid, vit d def, depression, asthma (dr wert) and chronic back pain (eunice thomas)  She states significant fatigue.  She has poor quality sleep and is not sleeping enough-she has not had good sleep for a while.  Her fatigue is worse in the last 2 months.  Medications and allergies reviewed with patient and updated if appropriate.  Patient Active Problem List   Diagnosis Date Noted   Insomnia 09/11/2019   Murmur, cardiac 01/16/2018   Moderate persistent asthma with exacerbation 12/13/2017   Osteoporosis 01/07/2017   Acute bronchitis 10/17/2016   Prediabetes 07/07/2016   History of breast cancer 01/06/2016   Intraductal papilloma of left breast 01/06/2016   Essential hypertension 06/08/2015   DOE (dyspnea on exertion) 06/08/2015   Fatigue 05/20/2015   Back pain 04/22/2015   Anemia due to blood loss 11/27/2013   Elevated CPK    Multinodular goiter 12/01/2011   Asthma    Hypothyroidism    CAD (coronary artery disease)    Atrial septal aneurysm    Hyperlipidemia    Scoliosis    Spinal stenosis, lumbar region, with neurogenic claudication 07/01/2011   Lumbar postlaminectomy syndrome 07/01/2011   Osteoarthritis of right hip 07/01/2011   Contracture of right hip 07/01/2011   Endometriosis    Depression 12/24/2009   Migraine headache 05/19/2006    Current Outpatient Medications on File Prior to Visit  Medication Sig Dispense Refill   albuterol (VENTOLIN HFA) 108 (90  Base) MCG/ACT inhaler Inhale 2 puffs into the lungs every 4 (four) hours as needed for wheezing or shortness of breath. 1 each 11   aspirin EC 81 MG tablet Take 1 tablet (81 mg total) by mouth daily. Swallow whole. 90 tablet 3   bisoprolol (ZEBETA) 5 MG tablet Take 10 mg by mouth daily.     DULoxetine (CYMBALTA) 30 MG capsule TAKE ONE CAPSULE EVERY DAY IN ADDITION TO '60MG'$  CAPSULE FOR A TOTAL OF '90MG'$  DAILY 90 capsule 1   DULoxetine (CYMBALTA) 60 MG capsule TAKE ONE CAPSULE BY MOUTH DAILY 30 capsule 1   famotidine (PEPCID) 20 MG tablet One after supper 30 tablet 11   gabapentin (NEURONTIN) 600 MG tablet TAKE ONE TABLET EVERY DAY AT BEDTIME 30 tablet 5   HYDROcodone-acetaminophen (NORCO) 10-325 MG tablet Take 1 tablet by mouth every 8 (eight) hours as needed. Do Not Fill Before 01/03/2021 100 tablet 0   ipratropium-albuterol (DUONEB) 0.5-2.5 (3) MG/3ML SOLN Take 3 mLs by nebulization every 4 (four) hours as needed (wheezing or SOB). During exacerbation 360 mL 1   irbesartan (AVAPRO) 150 MG tablet Take 75 mg by mouth daily.     levothyroxine (SYNTHROID) 75 MCG tablet TAKE 1 TABLET(75 MCG) BY MOUTH DAILY 90 tablet 2   mometasone-formoterol (DULERA) 100-5 MCG/ACT AERO Take 2 puffs first thing in am and then another 2 puffs about 12 hours later. 1 each 11   pantoprazole (PROTONIX) 40 MG tablet TAKE 1 TABLET(40 MG) BY MOUTH DAILY 30 TO 60 MINUTES  BEFORE FIRST MEAL OF THE DAY 30 tablet 2   amLODipine (NORVASC) 5 MG tablet Take by mouth. (Patient not taking: Reported on 01/11/2021)     benazepril (LOTENSIN) 20 MG tablet TAKE 1 TABLET BY MOUTH DAILY (Patient not taking: Reported on 01/11/2021) 90 tablet 2   No current facility-administered medications on file prior to visit.    Past Medical History:  Diagnosis Date   Anemia    hx   Arthritis    Asthma    PFTs, February, 2011, moderate obstructive disease with response to bronchodilators, normal lung volumes, moderate reduction in diffusing capacity    Atrial septal aneurysm    Echo, 2008-not noted on 13 echo   CAD (coronary artery disease)    90% distal LAD in the past  /   nuclear, 2008, no ischemia, ejection fraction 70%   Cancer ([place]) 2002   DUCTAL CIS--S/P LUMPECTOMY, RADIATION AND 6 WEEKS OF TAMOXIFEN   D-dimer, elevated    January, 2014   Depression    Ejection fraction    EF 60%, echo, October, 2008   Elevated CPK    January, 2014   Endometriosis 1989   RIGHT TUBE   Endometriosis 1987   LEFT TUBE/OVARY W FOCAL IN-SITU ENDOMETRIAL ADENOCARCINOMA   GERD (gastroesophageal reflux disease)    occ   H/O hiatal hernia    ?   Hyperlipidemia    Hypertension    Hypothyroidism    Patient has had in the past that she does not need treatment   Kyphoscoliosis    Obstructive airway disease ([place])    Pinched nerve    lower back   Shortness of breath    Echo 2/22: EF 60-65, no RWMA, mild LVH, GR 1  DD, GLS -24%, normal RVSF, mild MR, trivial AI, borderline dilation of aortic root (39 mm)   UTI (lower urinary tract infection)     Past Surgical History:  Procedure Laterality Date   ANKLE SURGERY Left    ligament   APPENDECTOMY  1987   AT [facility]   BACK SURGERY     Fusion   BREAST LUMPECTOMY  2003   radiation on right   BREAST LUMPECTOMY WITH RADIOACTIVE SEED LOCALIZATION Left 02/12/2016   Procedure: LEFT BREAST LUMPECTOMY WITH RADIOACTIVE SEED LOCALIZATION;  Surgeon: Excell Seltzer, MD;  Location: [place];  Service: General;  Laterality: Left;   CARDIOVASCULAR STRESS TEST  09/07/05   Nuclear, was negative   CATARACT EXTRACTION Bilateral 01,03   OOPHORECTOMY     LSO -RSO   PELVIC LAPAROSCOPY  1989   RSO, LYSIS OF ADHESIONS   TOTAL ABDOMINAL HYSTERECTOMY  1987   LSO, APPENDECTOMY   TOTAL HIP ARTHROPLASTY Right 10/28/2013   dr Lorin Mercy   TOTAL HIP ARTHROPLASTY Right 10/28/2013   Procedure: TOTAL HIP ARTHROPLASTY ANTERIOR APPROACH;  Surgeon: Marybelle Killings, MD;  Location: [place];  Service: Orthopedics;  Laterality:  Right;  Right Total Hip Arthroplasty, Direct Anterior Approach    Social History   Socioeconomic History   Marital status: Widowed    Spouse name: Not on file   Number of children: Not on file   Years of education: Not on file   Highest education level: Not on file  Occupational History   Not on file  Tobacco Use   Smoking status: Never   Smokeless tobacco: Never  Vaping Use   Vaping Use: Never used  Substance and Sexual Activity   Alcohol use: No    Alcohol/week:  0.0 standard drinks   Drug use: No   Sexual activity: Never    Birth control/protection: Surgical  Other Topics Concern   Not on file  Social History Narrative   Not on file   Social Determinants of Health   Financial Resource Strain: Low Risk    Difficulty of Paying Living Expenses: Not hard at all  Food Insecurity: No Food Insecurity   Worried About Charity fundraiser in the Last Year: Never true   [place] in the Last Year: Never true  Transportation Needs: No Transportation Needs   Lack of Transportation (Medical): No   Lack of Transportation (Non-Medical): No  Physical Activity: Inactive   Days of Exercise per Week: 0 days   Minutes of Exercise per Session: 0 min  Stress: No Stress Concern Present   Feeling of Stress : Not at all  Social Connections: Moderately Integrated   Frequency of Communication with Friends and Family: More than three times a week   Frequency of Social Gatherings with Friends and Family: More than three times a week   Attends Religious Services: More than 4 times per year   Active Member of Clubs or Organizations: No   Attends Music therapist: More than 4 times per year   Marital Status: Widowed    Family History  Problem Relation Age of Onset   Heart failure Mother    Pneumonia Mother    Hypertension Mother    Heart disease Mother    Stroke Maternal Grandfather    Hypertension Maternal Grandfather    Heart attack Father    Heart disease Father     Asthma Paternal Uncle        PAT UNCLES   Heart attack Paternal Uncle     Review of Systems  Constitutional:  Negative for chills and fever.  Respiratory:  Positive for cough (will cough up green sputum intermittent), shortness of breath and wheezing.   Cardiovascular:  Negative for chest pain, palpitations and leg swelling.  Neurological:  Positive for light-headedness. Negative for headaches.      Objective:   Vitals:   01/11/21 1527  BP: 122/80  Pulse: 80  Temp: 98.4 F (36.9 C)  SpO2: 95%   BP Readings from Last 3 Encounters:  01/11/21 122/80  12/29/20 111/66  11/27/20 123/78   Wt Readings from Last 3 Encounters:  01/11/21 160 lb (72.6 kg)  12/29/20 159 lb (72.1 kg)  11/27/20 160 lb 3.2 oz (72.7 kg)   Body mass index is 31.25 kg/m.   Physical Exam    Constitutional: Appears well-developed and well-nourished. No distress.  HENT:  Head: Normocephalic and atraumatic.  Neck: Neck supple. No tracheal deviation present. No thyromegaly present.  No cervical lymphadenopathy Cardiovascular: Normal rate, regular rhythm and normal heart sounds.   2/6 systolic murmur heard. No carotid bruit .  No edema Pulmonary/Chest: Effort normal and breath sounds normal. No respiratory distress. No has no wheezes. No rales.  Skin: Skin is warm and dry. Not diaphoretic.  Psychiatric: Normal mood and affect. Behavior is normal.      Assessment & Plan:    Hypertension: Chronic Blood pressure today well controlled At times she does experience lightheadedness and I wonder if her blood pressure is overly controlled-she will continue to monitor her BP and send me readings For now continue bisoprolol 10 mg daily, Avapro 75 mg daily CMP  Asthma: Following with Dr. Celesta Aver to have daily symptoms Was restarted on Dulera,  but she states this is not as effective as Breo and would like to go back to the [employer] Prescription for [facility] sent to pharmacy Using albuterol inhaler as  needed Still having daily symptoms

## 2021-01-11 ENCOUNTER — Other Ambulatory Visit: Payer: Self-pay | Admitting: *Deleted

## 2021-01-11 ENCOUNTER — Ambulatory Visit (INDEPENDENT_AMBULATORY_CARE_PROVIDER_SITE_OTHER): Payer: Medicare Other | Admitting: Internal Medicine

## 2021-01-11 ENCOUNTER — Encounter: Payer: Self-pay | Admitting: Internal Medicine

## 2021-01-11 ENCOUNTER — Other Ambulatory Visit: Payer: Self-pay

## 2021-01-11 VITALS — BP 122/80 | HR 80 | Temp 98.4°F | Ht 60.0 in | Wt 160.0 lb

## 2021-01-11 DIAGNOSIS — R7303 Prediabetes: Secondary | ICD-10-CM

## 2021-01-11 DIAGNOSIS — I10 Essential (primary) hypertension: Secondary | ICD-10-CM

## 2021-01-11 DIAGNOSIS — E7849 Other hyperlipidemia: Secondary | ICD-10-CM

## 2021-01-11 DIAGNOSIS — M810 Age-related osteoporosis without current pathological fracture: Secondary | ICD-10-CM

## 2021-01-11 DIAGNOSIS — E559 Vitamin D deficiency, unspecified: Secondary | ICD-10-CM

## 2021-01-11 DIAGNOSIS — F3289 Other specified depressive episodes: Secondary | ICD-10-CM

## 2021-01-11 DIAGNOSIS — K219 Gastro-esophageal reflux disease without esophagitis: Secondary | ICD-10-CM

## 2021-01-11 DIAGNOSIS — E039 Hypothyroidism, unspecified: Secondary | ICD-10-CM | POA: Diagnosis not present

## 2021-01-11 MED ORDER — FLUTICASONE FUROATE-VILANTEROL 100-25 MCG/INH IN AEPB: 1.0000 | INHALATION_SPRAY | Freq: Every day | RESPIRATORY_TRACT | 11 refills | Status: DC

## 2021-01-11 MED ORDER — ATORVASTATIN CALCIUM 20 MG PO TABS: 20.0000 mg | ORAL_TABLET | Freq: Every day | ORAL | 3 refills | Status: DC

## 2021-01-11 NOTE — Assessment & Plan Note (Signed)
Chronic Difficult to say if her depression is controlled or not At this point does not want to try to change medication so we will continue Cymbalta 90 mg daily

## 2021-01-11 NOTE — Assessment & Plan Note (Signed)
Chronic Not currently taking vitamin D-advised that she restarted Check vitamin D level

## 2021-01-11 NOTE — Assessment & Plan Note (Addendum)
Chronic  Clinically hypothyroid -having significant fatigue Currently taking levothyroxine 75 mcg daily Check tsh  Titrate med dose if needed

## 2021-01-11 NOTE — Assessment & Plan Note (Signed)
Chronic GERD controlled Continue protonix 40 mg qd, pepcid 20 mg qd

## 2021-01-11 NOTE — Assessment & Plan Note (Signed)
Chronic Has not decided on whether or not she wants to go on any medication Not able to exercise We will check vitamin D level

## 2021-01-11 NOTE — Assessment & Plan Note (Signed)
Chronic Continue atorvastatin 20 mg daily Following with cardiology

## 2021-01-11 NOTE — Assessment & Plan Note (Signed)
Chronic Check a1c Low sugar / carb diet 

## 2021-01-12 ENCOUNTER — Other Ambulatory Visit: Payer: Self-pay | Admitting: Internal Medicine

## 2021-01-12 LAB — COMPREHENSIVE METABOLIC PANEL
ALT: 18 U/L (ref 0–35)
AST: 27 U/L (ref 0–37)
Albumin: 3.9 g/dL (ref 3.5–5.2)
Alkaline Phosphatase: 97 U/L (ref 39–117)
BUN: 17 mg/dL (ref 6–23)
CO2: 37 mEq/L — ABNORMAL HIGH (ref 19–32)
Calcium: 9.4 mg/dL (ref 8.4–10.5)
Chloride: 97 mEq/L (ref 96–112)
Creatinine, Ser: 0.85 mg/dL (ref 0.40–1.20)
GFR: 68.92 mL/min (ref >60.00)
Glucose, Bld: 90 mg/dL (ref 70–99)
Potassium: 4.7 mEq/L (ref 3.5–5.1)
Sodium: 140 mEq/L (ref 135–145)
Total Bilirubin: 0.5 mg/dL (ref 0.2–1.2)
Total Protein: 6.4 g/dL (ref 6.0–8.3)

## 2021-01-12 LAB — CBC WITH DIFFERENTIAL/PLATELET
Basophils Absolute: 0.1 10*3/uL (ref 0.0–0.1)
Basophils Relative: 1.2 % (ref 0.0–3.0)
Eosinophils Absolute: 0.4 10*3/uL (ref 0.0–0.7)
Eosinophils Relative: 9.1 % — ABNORMAL HIGH (ref 0.0–5.0)
HCT: 40 % (ref 36.0–46.0)
Hemoglobin: 12.9 g/dL (ref 12.0–15.0)
Lymphocytes Relative: 23.6 % (ref 12.0–46.0)
Lymphs Abs: 1.1 10*3/uL (ref 0.7–4.0)
MCHC: 32.1 g/dL (ref 30.0–36.0)
MCV: 90.6 fl (ref 78.0–100.0)
Monocytes Absolute: 0.4 10*3/uL (ref 0.1–1.0)
Monocytes Relative: 9.2 % (ref 3.0–12.0)
Neutro Abs: 2.6 10*3/uL (ref 1.4–7.7)
Neutrophils Relative %: 56.9 % (ref 43.0–77.0)
Platelets: 206 10*3/uL (ref 150.0–400.0)
RBC: 4.42 Mil/uL (ref 3.87–5.11)
RDW: 15.2 % (ref 11.5–15.5)
WBC: 4.6 10*3/uL (ref 4.0–10.5)

## 2021-01-12 LAB — VITAMIN D 25 HYDROXY (VIT D DEFICIENCY, FRACTURES): VITD: 9.51 ng/mL — ABNORMAL LOW (ref 30.00–100.00)

## 2021-01-12 LAB — HEMOGLOBIN A1C: Hgb A1c MFr Bld: 5.9 % (ref 4.6–6.5)

## 2021-01-12 LAB — TSH: TSH: 2.75 u[IU]/mL (ref 0.35–5.50)

## 2021-01-12 MED ORDER — VITAMIN D (ERGOCALCIFEROL) 1.25 MG (50000 UNIT) PO CAPS: 50000.0000 [IU] | ORAL_CAPSULE | ORAL | 0 refills | Status: DC

## 2021-01-12 NOTE — Addendum Note (Signed)
Addended by: Binnie Rail on: 01/12/2021 07:32 PM   Modules accepted: Orders

## 2021-01-31 ENCOUNTER — Other Ambulatory Visit: Payer: Self-pay | Admitting: Internal Medicine

## 2021-02-04 ENCOUNTER — Encounter: Payer: Medicare Other | Admitting: Registered Nurse

## 2021-02-08 DIAGNOSIS — H34832 Tributary (branch) retinal vein occlusion, left eye, with macular edema: Secondary | ICD-10-CM | POA: Diagnosis not present

## 2021-02-10 ENCOUNTER — Encounter: Payer: Medicare Other | Attending: Physical Medicine & Rehabilitation | Admitting: Registered Nurse

## 2021-02-10 ENCOUNTER — Encounter: Payer: Self-pay | Admitting: Internal Medicine

## 2021-02-10 ENCOUNTER — Encounter: Payer: Self-pay | Admitting: Registered Nurse

## 2021-02-10 ENCOUNTER — Ambulatory Visit: Payer: Medicare Other | Admitting: Internal Medicine

## 2021-02-10 ENCOUNTER — Other Ambulatory Visit: Payer: Self-pay

## 2021-02-10 VITALS — BP 115/74 | HR 76 | Temp 97.7°F | Ht 60.0 in | Wt 160.2 lb

## 2021-02-10 VITALS — BP 120/78 | HR 68 | Ht 60.0 in | Wt 159.0 lb

## 2021-02-10 DIAGNOSIS — M961 Postlaminectomy syndrome, not elsewhere classified: Secondary | ICD-10-CM | POA: Insufficient documentation

## 2021-02-10 DIAGNOSIS — M419 Scoliosis, unspecified: Secondary | ICD-10-CM | POA: Insufficient documentation

## 2021-02-10 DIAGNOSIS — Z5181 Encounter for therapeutic drug level monitoring: Secondary | ICD-10-CM | POA: Diagnosis not present

## 2021-02-10 DIAGNOSIS — Z79891 Long term (current) use of opiate analgesic: Secondary | ICD-10-CM | POA: Insufficient documentation

## 2021-02-10 DIAGNOSIS — M24551 Contracture, right hip: Secondary | ICD-10-CM | POA: Insufficient documentation

## 2021-02-10 DIAGNOSIS — R490 Dysphonia: Secondary | ICD-10-CM

## 2021-02-10 DIAGNOSIS — Z1211 Encounter for screening for malignant neoplasm of colon: Secondary | ICD-10-CM

## 2021-02-10 DIAGNOSIS — R1319 Other dysphagia: Secondary | ICD-10-CM

## 2021-02-10 DIAGNOSIS — M48062 Spinal stenosis, lumbar region with neurogenic claudication: Secondary | ICD-10-CM | POA: Diagnosis not present

## 2021-02-10 DIAGNOSIS — K449 Diaphragmatic hernia without obstruction or gangrene: Secondary | ICD-10-CM | POA: Diagnosis not present

## 2021-02-10 DIAGNOSIS — G894 Chronic pain syndrome: Secondary | ICD-10-CM | POA: Insufficient documentation

## 2021-02-10 MED ORDER — HYDROCODONE-ACETAMINOPHEN 10-325 MG PO TABS: 1.0000 | ORAL_TABLET | Freq: Three times a day (TID) | ORAL | 0 refills | Status: DC | PRN

## 2021-02-10 NOTE — Progress Notes (Signed)
Subjective:    Patient ID: Hannah Scott, female    DOB: [birth year], 70 y.o.   MRN: [id number]  HPI: Hannah Scott is a 70 y.o. female who returns for follow up appointment for chronic pain and medication refill.  She states her pain is located in her lower back. She rates her pain 6. Her current exercise regime is walking and performing stretching exercises.  [name] Morphine equivalent is 33.33 MME.   Last Oral Swab was Performed on 10/27/2020, it was consistent.    Pain Inventory Average Pain 7 Pain Right Now 6 My pain is constant, burning, tingling, and aching  In the last 24 hours, has pain interfered with the following? General activity 10 Relation with others 10 Enjoyment of life 10 What TIME of day is your pain at its worst? morning , daytime, evening, and night Sleep (in general) Poor  Pain is worse with: walking and standing Pain improves with: rest and medication Relief from Meds: 6  use a cane use a walker ability to climb steps?  yes do you drive?  yes  disabled: date disabled 2008 I need assistance with the following:  household duties  weakness numbness spasms depression  Any changes since last visit?  no  Any changes since last visit?  no    Family History  Problem Relation Age of Onset   Heart failure Mother    Pneumonia Mother    Hypertension Mother    Heart disease Mother    Stroke Maternal Grandfather    Hypertension Maternal Grandfather    Heart attack Father    Heart disease Father    Asthma Paternal Uncle        PAT UNCLES   Heart attack Paternal Uncle    Social History   Socioeconomic History   Marital status: Widowed    Spouse name: Not on file   Number of children: Not on file   Years of education: Not on file   Highest education level: Not on file  Occupational History   Not on file  Tobacco Use   Smoking status: Never   Smokeless tobacco: Never  Vaping Use   Vaping Use: Never used  Substance and Sexual Activity    Alcohol use: No    Alcohol/week: 0.0 standard drinks   Drug use: No   Sexual activity: Never    Birth control/protection: Surgical  Other Topics Concern   Not on file  Social History Narrative   Not on file   Social Determinants of Health   Financial Resource Strain: Low Risk    Difficulty of Paying Living Expenses: Not hard at all  Food Insecurity: No Food Insecurity   Worried About Charity fundraiser in the Last Year: Never true   [place] in the Last Year: Never true  Transportation Needs: No Transportation Needs   Lack of Transportation (Medical): No   Lack of Transportation (Non-Medical): No  Physical Activity: Inactive   Days of Exercise per Week: 0 days   Minutes of Exercise per Session: 0 min  Stress: No Stress Concern Present   Feeling of Stress : Not at all  Social Connections: Moderately Integrated   Frequency of Communication with Friends and Family: More than three times a week   Frequency of Social Gatherings with Friends and Family: More than three times a week   Attends Religious Services: More than 4 times per year   Active Member of Clubs or Organizations: No  Attends Archivist Meetings: More than 4 times per year   Marital Status: Widowed   Past Surgical History:  Procedure Laterality Date   ANKLE SURGERY Left    ligament   APPENDECTOMY  1987   AT [facility]   BACK SURGERY     Fusion   BREAST LUMPECTOMY  2003   radiation on right   BREAST LUMPECTOMY WITH RADIOACTIVE SEED LOCALIZATION Left 02/12/2016   Procedure: LEFT BREAST LUMPECTOMY WITH RADIOACTIVE SEED LOCALIZATION;  Surgeon: Excell Seltzer, MD;  Location: [place];  Service: General;  Laterality: Left;   CARDIOVASCULAR STRESS TEST  09/07/05   Nuclear, was negative   CATARACT EXTRACTION Bilateral 01,03   OOPHORECTOMY     LSO -RSO   PELVIC LAPAROSCOPY  1989   RSO, LYSIS OF ADHESIONS   TOTAL ABDOMINAL HYSTERECTOMY  1987   LSO, APPENDECTOMY   TOTAL HIP  ARTHROPLASTY Right 10/28/2013   dr Lorin Mercy   TOTAL HIP ARTHROPLASTY Right 10/28/2013   Procedure: TOTAL HIP ARTHROPLASTY ANTERIOR APPROACH;  Surgeon: Marybelle Killings, MD;  Location: [place];  Service: Orthopedics;  Laterality: Right;  Right Total Hip Arthroplasty, Direct Anterior Approach   Past Medical History:  Diagnosis Date   Anemia    hx   Arthritis    Asthma    PFTs, February, 2011, moderate obstructive disease with response to bronchodilators, normal lung volumes, moderate reduction in diffusing capacity   Atrial septal aneurysm    Echo, 2008-not noted on 13 echo   CAD (coronary artery disease)    90% distal LAD in the past  /   nuclear, 2008, no ischemia, ejection fraction 70%   Cancer ([place]) 2002   DUCTAL CIS--S/P LUMPECTOMY, RADIATION AND 6 WEEKS OF TAMOXIFEN   D-dimer, elevated    January, 2014   Depression    Ejection fraction    EF 60%, echo, October, 2008   Elevated CPK    January, 2014   Endometriosis 1989   RIGHT TUBE   Endometriosis 1987   LEFT TUBE/OVARY W FOCAL IN-SITU ENDOMETRIAL ADENOCARCINOMA   GERD (gastroesophageal reflux disease)    occ   H/O hiatal hernia    ?   Hyperlipidemia    Hypertension    Hypothyroidism    Patient has had in the past that she does not need treatment   Kyphoscoliosis    Obstructive airway disease ([facility])    Pinched nerve    lower back   Shortness of breath    Echo 2/22: EF 60-65, no RWMA, mild LVH, GR 1  DD, GLS -24%, normal RVSF, mild MR, trivial AI, borderline dilation of aortic root (39 mm)   UTI (lower urinary tract infection)    Temp 97.7 F (36.5 C)   Ht 5' (1.524 m)   Wt 160 lb 3.2 oz (72.7 kg)   BMI 31.29 kg/m   Opioid Risk Score:   Fall Risk Score:  `1  Depression screen PHQ 2/9  Depression screen [facility] 2/9 02/10/2021 11/27/2020 10/27/2020 10/08/2020 09/24/2020 08/06/2020 07/21/2020  Decreased Interest 3 1 1 2 2 1 1   Down, Depressed, Hopeless 3 1 1 2 2 1 1   PHQ - 2 Score 6 2 2 4 4 2 2   Altered sleeping - - - - - 1 -   Tired, decreased energy - - - - - - -  Change in appetite - - - - - - -  Feeling bad or failure about yourself  - - - - - - -  Trouble concentrating - - - - - - -  Moving slowly or fidgety/restless - - - - - - -  Suicidal thoughts - - - - - - -  PHQ-9 Score - - - - - 3 -  Difficult doing work/chores - - - - - - -  Some recent data might be hidden      Review of Systems  Constitutional: Negative.   HENT: Negative.    Eyes: Negative.   Respiratory: Negative.    Cardiovascular: Negative.   Gastrointestinal: Negative.   Endocrine: Negative.   Genitourinary: Negative.   Musculoskeletal:  Positive for back pain and gait problem.  Skin: Negative.   Allergic/Immunologic: Negative.   Neurological:  Positive for weakness and numbness.  Psychiatric/Behavioral:  Positive for dysphoric mood.       Objective:   Physical Exam Vitals and nursing note reviewed.  Constitutional:      Appearance: Normal appearance.  Cardiovascular:     Rate and Rhythm: Normal rate and regular rhythm.     Pulses: Normal pulses.     Heart sounds: Normal heart sounds.  Pulmonary:     Effort: Pulmonary effort is normal.     Breath sounds: Normal breath sounds.  Musculoskeletal:     Cervical back: Normal range of motion and neck supple.     Comments: Normal Muscle Bulk and Muscle Testing Reveals:  Upper Extremities: Full ROM and Muscle Strength 5/5  Lumbar Paraspinal Tenderness: L-3-L-5 Lower Extremities: Full ROM and Muscle Strength 5/5 Arises from table slowly using walker for support Antalgic  Gait     Skin:    General: Skin is warm and dry.  Neurological:     Mental Status: She is alert and oriented to person, place, and time.  Psychiatric:        Mood and Affect: Mood normal.        Behavior: Behavior normal.         Assessment & Plan:  1. Lumbar post laminectomy syndrome with severe kyphoscoliosis thoracolumbar spine, s/p lumbar fusion/  02/10/2021 Continue current medication regimen.  Refilled: Hydrocodone 10/325mg  one tablet every 8 hours may take an extra tablet when pain is severe #100. We will continue the opioid monitoring program, this consists of regular clinic visits, examinations, urine drug screen, pill counts as well as use of [place] Controlled Substance Reporting system. A 12 month History has been reviewed on the [place] Controlled Substance Reporting System on 02/10/2021 2. Right Hip OA: S/P Right Hip Replacement 10/28/2013. 02/10/2021 3.Depression: Continue Cymbalta. Has Family Support and Friends.  Counseling with Doristine Bosworth. 02/10/2021. 4. Right Greater Trochanteric Tenderness: No complaints today. Continue to Monitor. Continue with Ice and Heat Therapy. 02/10/2021    F/U in 1 month

## 2021-02-10 NOTE — Patient Instructions (Signed)
You will be contacted by [facility] in the next 2 days to arrange a upper GI series with tablet.  The number on your caller ID will be [phone], please answer when they call.  If you have not heard from them in 2 days please call [phone] to schedule.     We are going to place a referral to [facility] for your hoarseness. They will contact you with an appointment. They are located at [address], [place] [address], phone # [phone].  I appreciate the opportunity to care for you. Silvano Rusk, MD, [facility]

## 2021-02-10 NOTE — Progress Notes (Signed)
Hannah Scott 70 y.o. 05-14-1950 [id number]  Assessment & Plan:   Encounter Diagnoses  Name Primary?   Esophageal dysphagia Yes   Hiatal hernia    Kyphoscoliosis    Colon cancer screening    Hoarseness     Otolaryngology referral for hoarseness  Upper GI series with tablet to evaluate dysphagia and hiatal hernia.  She has severe kyphoscoliosis and I wonder if she might not even have an intrathoracic stomach at this point.  Pending that consider an EGD if we think it will be beneficial.  We may understand her dysphagia and regurgitation issues adequately with the upper GI.  Screening colonoscopy pending that.  Her severe kyphoscoliosis could potentially impact site of service for this.  I will clarify with anesthesia staff prior to scheduling.   CC: Binnie Rail, MD    Subjective:   Chief Complaint: Colon cancer screening, regurgitation and hoarseness  HPI 70 year old white woman with severe kyphoscoliosis and asthma and coronary artery disease as well as a history of breast cancer who presents for consideration of screening colonoscopy (last -2005), and requesting EGD due to hoarseness and regurgitation.  She describes decreased voice quality and hoarseness that gets worse "the more I talk".  She is not had any ENT evaluation.  She is followed in pulmonary for asthma.  She is on an aggressive acid reflux regimen with PPI and H2 blocker and denies significant heartburn symptoms.  What she has had over the past 6 to 8 months is intermittent episodic impact dysphagia I think to solid foods.  She will have intense pressure and pain in the chest and then regurgitate the food bolus and have relief.  There is a history of hiatal hernia though she is never had any EGD or barium study that I am aware of.  We know this from chest x-rays, images viewed personally.  GI review of systems is negative for significant lower GI symptoms.  She cannot lie flat she sleeps in a chair this is due  to her severe kyphoscoliosis.  She has chronic back pain and really cannot stand up straight.  Hunched over quite a bit. Wt Readings from Last 3 Encounters:  02/10/21 159 lb (72.1 kg)  02/10/21 160 lb 3.2 oz (72.7 kg)  01/11/21 160 lb (72.6 kg)     Allergies  Allergen Reactions   Fluticasone-Salmeterol Other (See Comments)    REACTION: headache.  She is tolerating Dulera well.   Norvasc [Amlodipine Besylate] Swelling and Other (See Comments)    edema   Tape Other (See Comments)    Prefers paper tape   Current Meds  Medication Sig   albuterol (VENTOLIN HFA) 108 (90 Base) MCG/ACT inhaler Inhale 2 puffs into the lungs every 4 (four) hours as needed for wheezing or shortness of breath.   aspirin EC 81 MG tablet Take 1 tablet (81 mg total) by mouth daily. Swallow whole.   atorvastatin (LIPITOR) 20 MG tablet Take 1 tablet (20 mg total) by mouth daily.   bisoprolol (ZEBETA) 5 MG tablet Take 10 mg by mouth daily.   DULoxetine (CYMBALTA) 30 MG capsule TAKE ONE CAPSULE EVERY DAY IN ADDITION TO 60MG  CAPSULE FOR A TOTAL OF 90MG  DAILY   DULoxetine (CYMBALTA) 60 MG capsule TAKE ONE CAPSULE BY MOUTH DAILY ALONG WITH 30MG  CAPSULE   famotidine (PEPCID) 20 MG tablet One after supper   fluticasone furoate-vilanterol (BREO ELLIPTA) 100-25 MCG/INH AEPB Inhale 1 puff into the lungs daily.   gabapentin (NEURONTIN) 600 MG  tablet TAKE ONE TABLET EVERY DAY AT BEDTIME   HYDROcodone-acetaminophen (NORCO) 10-325 MG tablet Take 1 tablet by mouth every 8 (eight) hours as needed. Do Not Fill Before 02/13/2021   ipratropium-albuterol (DUONEB) 0.5-2.5 (3) MG/3ML SOLN Take 3 mLs by nebulization every 4 (four) hours as needed (wheezing or SOB). During exacerbation   irbesartan (AVAPRO) 150 MG tablet Take 75 mg by mouth daily.   levothyroxine (SYNTHROID) 75 MCG tablet TAKE 1 TABLET(75 MCG) BY MOUTH DAILY   pantoprazole (PROTONIX) 40 MG tablet TAKE 1 TABLET(40 MG) BY MOUTH DAILY 30 TO 60 MINUTES BEFORE FIRST MEAL OF THE  DAY   Vitamin D, Ergocalciferol, (DRISDOL) 1.25 MG (50000 UNIT) CAPS capsule Take 1 capsule (50,000 Units total) by mouth every 7 (seven) days.   Past Medical History:  Diagnosis Date   Anemia    hx   Arthritis    Asthma    PFTs, February, 2011, moderate obstructive disease with response to bronchodilators, normal lung volumes, moderate reduction in diffusing capacity   Atrial septal aneurysm    Echo, 2008-not noted on 13 echo   CAD (coronary artery disease)    90% distal LAD in the past  /   nuclear, 2008, no ischemia, ejection fraction 70%   Cancer ([place]) 2002   DUCTAL CIS--S/P LUMPECTOMY, RADIATION AND 6 WEEKS OF TAMOXIFEN   D-dimer, elevated    January, 2014   Depression    Ejection fraction    EF 60%, echo, October, 2008   Elevated CPK    January, 2014   Endometriosis 1989   RIGHT TUBE   Endometriosis 1987   LEFT TUBE/OVARY W FOCAL IN-SITU ENDOMETRIAL ADENOCARCINOMA   GERD (gastroesophageal reflux disease)    occ   H/O hiatal hernia    ?   Hyperlipidemia    Hypertension    Hypothyroidism    Patient has had in the past that she does not need treatment   Kyphoscoliosis    Obstructive airway disease ([place])    Pinched nerve    lower back   Shortness of breath    Echo 2/22: EF 60-65, no RWMA, mild LVH, GR 1  DD, GLS -24%, normal RVSF, mild MR, trivial AI, borderline dilation of aortic root (39 mm)   UTI (lower urinary tract infection)    Past Surgical History:  Procedure Laterality Date   ANKLE SURGERY Left    ligament   APPENDECTOMY  1987   AT [facility]   BACK SURGERY     Fusion   BREAST LUMPECTOMY  2003   radiation on right   BREAST LUMPECTOMY WITH RADIOACTIVE SEED LOCALIZATION Left 02/12/2016   Procedure: LEFT BREAST LUMPECTOMY WITH RADIOACTIVE SEED LOCALIZATION;  Surgeon: Excell Seltzer, MD;  Location: [facility];  Service: General;  Laterality: Left;   CARDIOVASCULAR STRESS TEST  09/07/05   Nuclear, was negative   CATARACT EXTRACTION Bilateral  01,03   OOPHORECTOMY     LSO -RSO   PELVIC LAPAROSCOPY  1989   RSO, LYSIS OF ADHESIONS   TOTAL ABDOMINAL HYSTERECTOMY  1987   LSO, APPENDECTOMY   TOTAL HIP ARTHROPLASTY Right 10/28/2013   dr Lorin Mercy   TOTAL HIP ARTHROPLASTY Right 10/28/2013   Procedure: TOTAL HIP ARTHROPLASTY ANTERIOR APPROACH;  Surgeon: Marybelle Killings, MD;  Location: [facility];  Service: Orthopedics;  Laterality: Right;  Right Total Hip Arthroplasty, Direct Anterior Approach   Social History   Social History Narrative   Widowed in 2015   2 sons 1 lives in the  area the other is in close contact    1 grandchild   Never smoker no drug use no alcohol   family history includes Asthma in her paternal uncle; Heart attack in her father and paternal uncle; Heart disease in her father and mother; Heart failure in her mother; Hypertension in her maternal grandfather and mother; Pneumonia in her mother; Stroke in her maternal grandfather.   Review of Systems As per HPI.  All other review of systems are negative.  Objective:   Physical Exam BP 120/78   Pulse 68   Ht 5' (1.524 m)   Wt 159 lb (72.1 kg)   BMI 31.05 kg/m  Chron ill elderly ww, cannot stand fully erect Hoarse voice Lungs clr Chest - profound kyphoscoliosis Cor NL s1s2 no rmg Abd soft NT no mass, HSM, BS + Alert and oriented x 3   Data reviewed in the EMR includes 2022 pulmonary notes primary care notes and most recent chest x-ray including personal review of the images myself.  Labs in the EMR as well are reviewed.

## 2021-02-24 ENCOUNTER — Ambulatory Visit (HOSPITAL_COMMUNITY): Admission: RE | Admit: 2021-02-24 | Payer: Medicare Other | Source: Ambulatory Visit

## 2021-02-26 ENCOUNTER — Inpatient Hospital Stay (HOSPITAL_COMMUNITY): Admission: RE | Admit: 2021-02-26 | Payer: Medicare Other | Source: Ambulatory Visit

## 2021-03-01 ENCOUNTER — Other Ambulatory Visit: Payer: Self-pay | Admitting: Internal Medicine

## 2021-03-01 DIAGNOSIS — R0609 Other forms of dyspnea: Secondary | ICD-10-CM

## 2021-03-05 ENCOUNTER — Other Ambulatory Visit: Payer: Self-pay

## 2021-03-05 ENCOUNTER — Ambulatory Visit (HOSPITAL_COMMUNITY)
Admission: RE | Admit: 2021-03-05 | Discharge: 2021-03-05 | Disposition: A | Discharge status: Home / Self Care | Payer: Medicare Other | Source: Ambulatory Visit | Attending: Internal Medicine | Admitting: Internal Medicine

## 2021-03-05 DIAGNOSIS — R1319 Other dysphagia: Secondary | ICD-10-CM | POA: Insufficient documentation

## 2021-03-05 DIAGNOSIS — Z1211 Encounter for screening for malignant neoplasm of colon: Secondary | ICD-10-CM

## 2021-03-05 DIAGNOSIS — K449 Diaphragmatic hernia without obstruction or gangrene: Secondary | ICD-10-CM | POA: Insufficient documentation

## 2021-03-11 ENCOUNTER — Encounter: Payer: Medicare Other | Attending: Physical Medicine & Rehabilitation | Admitting: Registered Nurse

## 2021-03-11 ENCOUNTER — Encounter: Payer: Self-pay | Admitting: Registered Nurse

## 2021-03-11 ENCOUNTER — Other Ambulatory Visit: Payer: Self-pay

## 2021-03-11 VITALS — BP 118/76 | HR 80 | Ht 60.0 in | Wt 158.0 lb

## 2021-03-11 DIAGNOSIS — Z79891 Long term (current) use of opiate analgesic: Secondary | ICD-10-CM | POA: Insufficient documentation

## 2021-03-11 DIAGNOSIS — M961 Postlaminectomy syndrome, not elsewhere classified: Secondary | ICD-10-CM | POA: Diagnosis not present

## 2021-03-11 DIAGNOSIS — G894 Chronic pain syndrome: Secondary | ICD-10-CM | POA: Insufficient documentation

## 2021-03-11 DIAGNOSIS — M24551 Contracture, right hip: Secondary | ICD-10-CM | POA: Diagnosis not present

## 2021-03-11 DIAGNOSIS — Z5181 Encounter for therapeutic drug level monitoring: Secondary | ICD-10-CM | POA: Insufficient documentation

## 2021-03-11 DIAGNOSIS — M48062 Spinal stenosis, lumbar region with neurogenic claudication: Secondary | ICD-10-CM | POA: Diagnosis not present

## 2021-03-11 MED ORDER — HYDROCODONE-ACETAMINOPHEN 10-325 MG PO TABS: 1.0000 | ORAL_TABLET | Freq: Three times a day (TID) | ORAL | 0 refills | Status: DC | PRN

## 2021-03-11 NOTE — Progress Notes (Signed)
Subjective:    Patient ID: Hannah Scott, female    DOB: Aug 29, 1950, 70 y.o.   MRN: [id number]  HPI: Hannah Scott is a 70 y.o. female who was scheduled for telephone visit, she agrees with telephone visit and verbalizes understanding. She states her pain is located in her lower back. She rates her pain 5. Her current exercise regime is walking and performing stretching exercises.  Hannah Scott Morphine equivalent is 33.33 MME.   Last Oral Swab was Performed on 10/27/2020, it was consistent.     Pain Inventory Average Pain 6 Pain Right Now 5 My pain is constant, burning, tingling, and aching  In the last 24 hours, has pain interfered with the following? General activity 10 Relation with others 10 Enjoyment of life 10 What TIME of day is your pain at its worst? morning , daytime, evening, and night Sleep (in general) Poor  Pain is worse with: walking and standing Pain improves with: rest and medication Relief from Meds: 6  Family History  Problem Relation Age of Onset   Heart failure Mother    Pneumonia Mother    Hypertension Mother    Heart disease Mother    Stroke Maternal Grandfather    Hypertension Maternal Grandfather    Heart attack Father    Heart disease Father    Asthma Paternal Uncle        PAT UNCLES   Heart attack Paternal Uncle    Social History   Socioeconomic History   Marital status: Widowed    Spouse name: Not on file   Number of children: Not on file   Years of education: Not on file   Highest education level: Not on file  Occupational History   Not on file  Tobacco Use   Smoking status: Never   Smokeless tobacco: Never  Vaping Use   Vaping Use: Never used  Substance and Sexual Activity   Alcohol use: No    Alcohol/week: 0.0 standard drinks   Drug use: No   Sexual activity: Not Currently    Birth control/protection: Surgical  Other Topics Concern   Not on file  Social History Narrative   Widowed in 2015   2 sons 1 lives in the area the other  is in close contact    1 grandchild   Never smoker no drug use no alcohol   Social Determinants of Radio broadcast assistant Strain: Low Risk    Difficulty of Paying Living Expenses: Not hard at all  Food Insecurity: No Food Insecurity   Worried About Charity fundraiser in the Last Year: Never true   [place] in the Last Year: Never true  Transportation Needs: No Transportation Needs   Lack of Transportation (Medical): No   Lack of Transportation (Non-Medical): No  Physical Activity: Inactive   Days of Exercise per Week: 0 days   Minutes of Exercise per Session: 0 min  Stress: No Stress Concern Present   Feeling of Stress : Not at all  Social Connections: Moderately Integrated   Frequency of Communication with Friends and Family: More than three times a week   Frequency of Social Gatherings with Friends and Family: More than three times a week   Attends Religious Services: More than 4 times per year   Active Member of Clubs or Organizations: No   Attends Music therapist: More than 4 times per year   Marital Status: Widowed   Past Surgical History:  Procedure Laterality Date   ANKLE SURGERY Left    ligament   APPENDECTOMY  1987   AT [facility]   BACK SURGERY     Fusion   BREAST LUMPECTOMY  2003   radiation on right   BREAST LUMPECTOMY WITH RADIOACTIVE SEED LOCALIZATION Left 02/12/2016   Procedure: LEFT BREAST LUMPECTOMY WITH RADIOACTIVE SEED LOCALIZATION;  Surgeon: Excell Seltzer, MD;  Location: [facility];  Service: General;  Laterality: Left;   CARDIOVASCULAR STRESS TEST  09/07/05   Nuclear, was negative   CATARACT EXTRACTION Bilateral 01,03   OOPHORECTOMY     LSO -RSO   PELVIC LAPAROSCOPY  1989   RSO, LYSIS OF ADHESIONS   TOTAL ABDOMINAL HYSTERECTOMY  1987   LSO, APPENDECTOMY   TOTAL HIP ARTHROPLASTY Right 10/28/2013   dr Lorin Mercy   TOTAL HIP ARTHROPLASTY Right 10/28/2013   Procedure: TOTAL HIP ARTHROPLASTY ANTERIOR APPROACH;  Surgeon:  Marybelle Killings, MD;  Location: [place] [facility];  Service: Orthopedics;  Laterality: Right;  Right Total Hip Arthroplasty, Direct Anterior Approach   Past Surgical History:  Procedure Laterality Date   ANKLE SURGERY Left    ligament   APPENDECTOMY  1987   AT [facility]   BACK SURGERY     Fusion   BREAST LUMPECTOMY  2003   radiation on right   BREAST LUMPECTOMY WITH RADIOACTIVE SEED LOCALIZATION Left 02/12/2016   Procedure: LEFT BREAST LUMPECTOMY WITH RADIOACTIVE SEED LOCALIZATION;  Surgeon: Excell Seltzer, MD;  Location: [facility];  Service: General;  Laterality: Left;   CARDIOVASCULAR STRESS TEST  09/07/05   Nuclear, was negative   CATARACT EXTRACTION Bilateral 01,03   OOPHORECTOMY     LSO -RSO   PELVIC LAPAROSCOPY  1989   RSO, LYSIS OF ADHESIONS   TOTAL ABDOMINAL HYSTERECTOMY  1987   LSO, APPENDECTOMY   TOTAL HIP ARTHROPLASTY Right 10/28/2013   dr Lorin Mercy   TOTAL HIP ARTHROPLASTY Right 10/28/2013   Procedure: TOTAL HIP ARTHROPLASTY ANTERIOR APPROACH;  Surgeon: Marybelle Killings, MD;  Location: [facility];  Service: Orthopedics;  Laterality: Right;  Right Total Hip Arthroplasty, Direct Anterior Approach   Past Medical History:  Diagnosis Date   Anemia    hx   Arthritis    Asthma    PFTs, February, 2011, moderate obstructive disease with response to bronchodilators, normal lung volumes, moderate reduction in diffusing capacity   Atrial septal aneurysm    Echo, 2008-not noted on 13 echo   CAD (coronary artery disease)    90% distal LAD in the past  /   nuclear, 2008, no ischemia, ejection fraction 70%   Cancer ([place]) 2002   DUCTAL CIS--S/P LUMPECTOMY, RADIATION AND 6 WEEKS OF TAMOXIFEN   D-dimer, elevated    January, 2014   Depression    Ejection fraction    EF 60%, echo, October, 2008   Elevated CPK    January, 2014   Endometriosis 1989   RIGHT TUBE   Endometriosis 1987   LEFT TUBE/OVARY W FOCAL IN-SITU ENDOMETRIAL ADENOCARCINOMA   GERD (gastroesophageal reflux disease)    occ    H/O hiatal hernia    ?   Hyperlipidemia    Hypertension    Hypothyroidism    Patient has had in the past that she does not need treatment   Kyphoscoliosis    Obstructive airway disease ([place])    Pinched nerve    lower back   Shortness of breath    Echo 2/22: EF 60-65, no RWMA, mild LVH, GR  1  DD, GLS -24%, normal RVSF, mild MR, trivial AI, borderline dilation of aortic root (39 mm)   UTI (lower urinary tract infection)    BP 118/76 Comment: per paient at home  Pulse 80 Comment: per patient at home  Ht 5' (1.524 m)   Wt 158 lb (71.7 kg) Comment: per patient  BMI 30.86 kg/m   Opioid Risk Score:   Fall Risk Score:  `1  Depression screen PHQ 2/9  Depression screen [facility] 2/9 03/11/2021 02/10/2021 11/27/2020 10/27/2020 10/08/2020 09/24/2020 08/06/2020  Decreased Interest 3 3 1 1 2 2 1   Down, Depressed, Hopeless 3 3 1 1 2 2 1   PHQ - 2 Score 6 6 2 2 4 4 2   Altered sleeping - - - - - - 1  Tired, decreased energy - - - - - - -  Change in appetite - - - - - - -  Feeling bad or failure about yourself  - - - - - - -  Trouble concentrating - - - - - - -  Moving slowly or fidgety/restless - - - - - - -  Suicidal thoughts - - - - - - -  PHQ-9 Score - - - - - - 3  Difficult doing work/chores - - - - - - -  Some recent data might be hidden     Review of Systems  Constitutional: Negative.   HENT: Negative.    Eyes: Negative.   Respiratory: Negative.    Cardiovascular: Negative.   Gastrointestinal: Negative.   Endocrine: Negative.   Genitourinary: Negative.   Musculoskeletal:  Positive for back pain and gait problem.  Skin: Negative.   Allergic/Immunologic: Negative.   Neurological:  Positive for weakness, light-headedness and numbness.  Hematological: Negative.   Psychiatric/Behavioral:  Positive for dysphoric mood and sleep disturbance. The patient is nervous/anxious.       Objective:   Physical Exam Vitals and nursing note reviewed.  Musculoskeletal:     Comments: No Physical Exam  Performed : Telephone Visit         Assessment & Plan:  1. Lumbar post laminectomy syndrome with severe kyphoscoliosis thoracolumbar spine, s/p lumbar fusion/  03/11/2021 Continue current medication regimen. Refilled: Hydrocodone 10/325mg  one tablet every 8 hours may take an extra tablet when pain is severe #100. We will continue the opioid monitoring program, this consists of regular clinic visits, examinations, urine drug screen, pill counts as well as use of [place] Controlled Substance Reporting system. A 12 month History has been reviewed on the [place] Controlled Substance Reporting System on 03/11/2021 2. Right Hip OA: S/P Right Hip Replacement 10/28/2013. 03/11/2021 3.Depression: Continue Cymbalta. Has Family Support and Friends.  Counseling with Doristine Bosworth. 03/11/2021. 4. Right Greater Trochanteric Tenderness: No complaints today. Continue to Monitor. Continue with Ice and Heat Therapy. 11/172022    F/U in 1 month

## 2021-03-15 DIAGNOSIS — H34832 Tributary (branch) retinal vein occlusion, left eye, with macular edema: Secondary | ICD-10-CM | POA: Diagnosis not present

## 2021-03-30 ENCOUNTER — Other Ambulatory Visit: Payer: Self-pay | Admitting: Internal Medicine

## 2021-03-30 DIAGNOSIS — M419 Scoliosis, unspecified: Secondary | ICD-10-CM | POA: Diagnosis not present

## 2021-03-30 DIAGNOSIS — J45909 Unspecified asthma, uncomplicated: Secondary | ICD-10-CM | POA: Diagnosis not present

## 2021-03-30 DIAGNOSIS — R490 Dysphonia: Secondary | ICD-10-CM | POA: Diagnosis not present

## 2021-03-30 DIAGNOSIS — K219 Gastro-esophageal reflux disease without esophagitis: Secondary | ICD-10-CM | POA: Diagnosis not present

## 2021-04-01 ENCOUNTER — Other Ambulatory Visit: Payer: Self-pay | Admitting: Registered Nurse

## 2021-04-01 ENCOUNTER — Other Ambulatory Visit: Payer: Self-pay | Admitting: Internal Medicine

## 2021-04-09 ENCOUNTER — Telehealth: Payer: Self-pay

## 2021-04-09 DIAGNOSIS — Z23 Encounter for immunization: Secondary | ICD-10-CM | POA: Diagnosis not present

## 2021-04-09 NOTE — Telephone Encounter (Signed)
I spoke to [facility] and they have the referral information to make her an appointment. They said to have her call them to set up the appointment. I left her a detailed phone message with all the information. [facility] phone # is [phone].

## 2021-04-12 ENCOUNTER — Telehealth: Payer: Self-pay | Admitting: Internal Medicine

## 2021-04-12 MED ORDER — PREDNISONE 20 MG PO TABS: 40.0000 mg | ORAL_TABLET | Freq: Every day | ORAL | 0 refills | Status: AC

## 2021-04-12 NOTE — Telephone Encounter (Signed)
Spoke with patient today. 

## 2021-04-12 NOTE — Telephone Encounter (Signed)
Patient states she is having an asthma flare up  Offered patient a vv for tomorrow given this is the soonest appt, patient declined  Patient requesting a call back to discuss ov

## 2021-04-12 NOTE — Telephone Encounter (Signed)
Prednisone 40 mg x 5 days sent to pharmacy-if she is not feeling better she needs to be evaluated in person.

## 2021-04-13 ENCOUNTER — Encounter: Payer: Medicare Other | Admitting: Registered Nurse

## 2021-04-16 NOTE — Telephone Encounter (Signed)
Patient was seen 03/30/2022, Dr Quay Burow had also referred her there.

## 2021-04-22 ENCOUNTER — Encounter (HOSPITAL_COMMUNITY): Payer: Self-pay | Admitting: Internal Medicine

## 2021-04-22 ENCOUNTER — Encounter: Payer: Medicare Other | Attending: Physical Medicine & Rehabilitation | Admitting: Registered Nurse

## 2021-04-22 ENCOUNTER — Other Ambulatory Visit: Payer: Self-pay

## 2021-04-22 ENCOUNTER — Encounter: Payer: Self-pay | Admitting: Registered Nurse

## 2021-04-22 VITALS — BP 168/83 | HR 73 | Ht 60.0 in | Wt 159.0 lb

## 2021-04-22 DIAGNOSIS — M961 Postlaminectomy syndrome, not elsewhere classified: Secondary | ICD-10-CM | POA: Insufficient documentation

## 2021-04-22 DIAGNOSIS — M48062 Spinal stenosis, lumbar region with neurogenic claudication: Secondary | ICD-10-CM | POA: Diagnosis not present

## 2021-04-22 DIAGNOSIS — G894 Chronic pain syndrome: Secondary | ICD-10-CM | POA: Diagnosis not present

## 2021-04-22 DIAGNOSIS — Z5181 Encounter for therapeutic drug level monitoring: Secondary | ICD-10-CM | POA: Insufficient documentation

## 2021-04-22 DIAGNOSIS — Z79891 Long term (current) use of opiate analgesic: Secondary | ICD-10-CM

## 2021-04-22 DIAGNOSIS — M24551 Contracture, right hip: Secondary | ICD-10-CM | POA: Insufficient documentation

## 2021-04-22 MED ORDER — HYDROCODONE-ACETAMINOPHEN 10-325 MG PO TABS: 1.0000 | ORAL_TABLET | Freq: Three times a day (TID) | ORAL | 0 refills | Status: DC | PRN

## 2021-04-22 NOTE — Progress Notes (Signed)
Subjective:    Patient ID: Hannah Scott, female    DOB: 1951-01-07, 70 y.o.   MRN: [id number]  HPI: Hannah Scott is a 70 y.o. female who returns for follow up appointment for chronic pain and medication refill. She states her pain is located in her lower back and bilateral knees. She rates her pain 7.Her current exercise regime is walking with her walker  [name] Morphine equivalent is 33.33 MME.   Last Oral Swab was Performed on 10/27/2020, it was consistent.    Pain Inventory Average Pain 7 Pain Right Now 7 My pain is constant, burning, tingling, and aching  In the last 24 hours, has pain interfered with the following? General activity 10 Relation with others 10 Enjoyment of life 10 What TIME of day is your pain at its worst? morning , daytime, evening, and night Sleep (in general) Poor  Pain is worse with: walking and standing Pain improves with: rest and medication Relief from Meds: 6  Family History  Problem Relation Age of Onset   Heart failure Mother    Pneumonia Mother    Hypertension Mother    Heart disease Mother    Stroke Maternal Grandfather    Hypertension Maternal Grandfather    Heart attack Father    Heart disease Father    Asthma Paternal Uncle        PAT UNCLES   Heart attack Paternal Uncle    Social History   Socioeconomic History   Marital status: Widowed    Spouse name: Not on file   Number of children: Not on file   Years of education: Not on file   Highest education level: Not on file  Occupational History   Not on file  Tobacco Use   Smoking status: Never   Smokeless tobacco: Never  Vaping Use   Vaping Use: Never used  Substance and Sexual Activity   Alcohol use: No    Alcohol/week: 0.0 standard drinks   Drug use: No   Sexual activity: Not Currently    Birth control/protection: Surgical  Other Topics Concern   Not on file  Social History Narrative   Widowed in 2015   2 sons 1 lives in the area the other is in close contact    1  grandchild   Never smoker no drug use no alcohol   Social Determinants of Radio broadcast assistant Strain: Low Risk    Difficulty of Paying Living Expenses: Not hard at all  Food Insecurity: No Food Insecurity   Worried About Charity fundraiser in the Last Year: Never true   [place] in the Last Year: Never true  Transportation Needs: No Transportation Needs   Lack of Transportation (Medical): No   Lack of Transportation (Non-Medical): No  Physical Activity: Inactive   Days of Exercise per Week: 0 days   Minutes of Exercise per Session: 0 min  Stress: No Stress Concern Present   Feeling of Stress : Not at all  Social Connections: Moderately Integrated   Frequency of Communication with Friends and Family: More than three times a week   Frequency of Social Gatherings with Friends and Family: More than three times a week   Attends Religious Services: More than 4 times per year   Active Member of Clubs or Organizations: No   Attends Music therapist: More than 4 times per year   Marital Status: Widowed   Past Surgical History:  Procedure Laterality  Date   ANKLE SURGERY Left    ligament   APPENDECTOMY  1987   AT [facility]   BACK SURGERY     Fusion   BREAST LUMPECTOMY  2003   radiation on right   BREAST LUMPECTOMY WITH RADIOACTIVE SEED LOCALIZATION Left 02/12/2016   Procedure: LEFT BREAST LUMPECTOMY WITH RADIOACTIVE SEED LOCALIZATION;  Surgeon: Excell Seltzer, MD;  Location: [place];  Service: General;  Laterality: Left;   CARDIOVASCULAR STRESS TEST  09/07/05   Nuclear, was negative   CATARACT EXTRACTION Bilateral 01,03   OOPHORECTOMY     LSO -RSO   PELVIC LAPAROSCOPY  1989   RSO, LYSIS OF ADHESIONS   TOTAL ABDOMINAL HYSTERECTOMY  1987   LSO, APPENDECTOMY   TOTAL HIP ARTHROPLASTY Right 10/28/2013   dr Lorin Mercy   TOTAL HIP ARTHROPLASTY Right 10/28/2013   Procedure: TOTAL HIP ARTHROPLASTY ANTERIOR APPROACH;  Surgeon: Marybelle Killings, MD;   Location: [place];  Service: Orthopedics;  Laterality: Right;  Right Total Hip Arthroplasty, Direct Anterior Approach   Past Surgical History:  Procedure Laterality Date   ANKLE SURGERY Left    ligament   APPENDECTOMY  1987   AT [facility]   BACK SURGERY     Fusion   BREAST LUMPECTOMY  2003   radiation on right   BREAST LUMPECTOMY WITH RADIOACTIVE SEED LOCALIZATION Left 02/12/2016   Procedure: LEFT BREAST LUMPECTOMY WITH RADIOACTIVE SEED LOCALIZATION;  Surgeon: Excell Seltzer, MD;  Location: [place];  Service: General;  Laterality: Left;   CARDIOVASCULAR STRESS TEST  09/07/05   Nuclear, was negative   CATARACT EXTRACTION Bilateral 01,03   OOPHORECTOMY     LSO -RSO   PELVIC LAPAROSCOPY  1989   RSO, LYSIS OF ADHESIONS   TOTAL ABDOMINAL HYSTERECTOMY  1987   LSO, APPENDECTOMY   TOTAL HIP ARTHROPLASTY Right 10/28/2013   dr Lorin Mercy   TOTAL HIP ARTHROPLASTY Right 10/28/2013   Procedure: TOTAL HIP ARTHROPLASTY ANTERIOR APPROACH;  Surgeon: Marybelle Killings, MD;  Location: [place];  Service: Orthopedics;  Laterality: Right;  Right Total Hip Arthroplasty, Direct Anterior Approach   Past Medical History:  Diagnosis Date   Anemia    hx   Arthritis    Asthma    PFTs, February, 2011, moderate obstructive disease with response to bronchodilators, normal lung volumes, moderate reduction in diffusing capacity   Atrial septal aneurysm    Echo, 2008-not noted on 13 echo   CAD (coronary artery disease)    90% distal LAD in the past  /   nuclear, 2008, no ischemia, ejection fraction 70%   Cancer ([place]) 2002   DUCTAL CIS--S/P LUMPECTOMY, RADIATION AND 6 WEEKS OF TAMOXIFEN   D-dimer, elevated    January, 2014   Depression    Ejection fraction    EF 60%, echo, October, 2008   Elevated CPK    January, 2014   Endometriosis 1989   RIGHT TUBE   Endometriosis 1987   LEFT TUBE/OVARY W FOCAL IN-SITU ENDOMETRIAL ADENOCARCINOMA   GERD (gastroesophageal reflux disease)    occ   H/O hiatal hernia     ?   Hyperlipidemia    Hypertension    Hypothyroidism    Patient has had in the past that she does not need treatment   Kyphoscoliosis    Obstructive airway disease ([place])    Pinched nerve    lower back   Shortness of breath    Echo 2/22: EF 60-65, no RWMA, mild LVH, GR 1  DD, GLS -24%, normal RVSF, mild MR, trivial AI, borderline dilation of aortic root (39 mm)   UTI (lower urinary tract infection)    BP (!) 167/77    Pulse 78    Ht 5' (1.524 m)    Wt 159 lb (72.1 kg)    SpO2 (!) 89%    BMI 31.05 kg/m   Opioid Risk Score:   Fall Risk Score:  `1  Depression screen PHQ 2/9  Depression screen [facility] 2/9 03/11/2021 02/10/2021 11/27/2020 10/27/2020 10/08/2020 09/24/2020 08/06/2020  Decreased Interest 3 3 1 1 2 2 1   Down, Depressed, Hopeless 3 3 1 1 2 2 1   PHQ - 2 Score 6 6 2 2 4 4 2   Altered sleeping - - - - - - 1  Tired, decreased energy - - - - - - -  Change in appetite - - - - - - -  Feeling bad or failure about yourself  - - - - - - -  Trouble concentrating - - - - - - -  Moving slowly or fidgety/restless - - - - - - -  Suicidal thoughts - - - - - - -  PHQ-9 Score - - - - - - 3  Difficult doing work/chores - - - - - - -  Some recent data might be hidden     Review of Systems     Objective:   Physical Exam Vitals and nursing note reviewed.  Constitutional:      Appearance: Normal appearance.  Cardiovascular:     Rate and Rhythm: Normal rate and regular rhythm.     Pulses: Normal pulses.     Heart sounds: Normal heart sounds.  Pulmonary:     Effort: Pulmonary effort is normal.     Breath sounds: Normal breath sounds.  Musculoskeletal:     Cervical back: Normal range of motion and neck supple.     Comments: Normal Muscle Bulk and Muscle Testing Reveals:  Upper Extremities: Full ROM and Muscle Strength  5/5  Lumbar Paraspinal Tenderness: L-3-L-5 Left Greater Trochanter Tenderness Lower Extremities: Full ROM and Muscle Strength 5/5 Arises from Table Slowly using walker for  support Antalgic  Gait     Skin:    General: Skin is warm and dry.  Neurological:     Mental Status: She is alert and oriented to person, place, and time.  Psychiatric:        Mood and Affect: Mood normal.        Behavior: Behavior normal.         Assessment & Plan:  1. Lumbar post laminectomy syndrome with severe kyphoscoliosis thoracolumbar spine, s/p lumbar fusion/  04/22/2021 Continue current medication regimen. Refilled: Hydrocodone 10/325mg  one tablet every 8 hours may take an extra tablet when pain is severe #100. We will continue the opioid monitoring program, this consists of regular clinic visits, examinations, urine drug screen, pill counts as well as use of [place] Controlled Substance Reporting system. A 12 month History has been reviewed on the [place] Controlled Substance Reporting System on 04/22/2021 2. Right Hip OA: S/P Right Hip Replacement 10/28/2013. 04/22/2021 3.Depression: Continue Cymbalta. Has Family Support and Friends.  Counseling with Doristine Bosworth. 04/22/2021. 4. Right Greater Trochanteric Tenderness: No complaints today. Continue to Monitor. Continue with Ice and Heat Therapy. 04/22/2021    F/U in 1 month

## 2021-04-29 DIAGNOSIS — H34832 Tributary (branch) retinal vein occlusion, left eye, with macular edema: Secondary | ICD-10-CM | POA: Diagnosis not present

## 2021-05-03 ENCOUNTER — Telehealth: Payer: Self-pay | Admitting: Internal Medicine

## 2021-05-03 NOTE — Telephone Encounter (Signed)
I spoke with Vickii Chafe and answered her questions about prepping for tomorrow.

## 2021-05-03 NOTE — Telephone Encounter (Signed)
Inbound call from patient, states that she has a question about her prep for her procedure tomorrow at [facility]. Please advise.

## 2021-05-03 NOTE — Anesthesia Preprocedure Evaluation (Addendum)
Anesthesia Evaluation  Patient identified by MRN, date of birth, ID band Patient awake    Reviewed: Allergy & Precautions, NPO status , Patient's Chart, lab work & pertinent test results, reviewed documented beta blocker date and time   Airway Mallampati: III  TM Distance: >3 FB Neck ROM: Full    Dental  (+) Teeth Intact, Dental Advisory Given, Chipped,    Pulmonary asthma (not well controlled, uses inhlaer almost every day) ,    Pulmonary exam normal breath sounds clear to auscultation       Cardiovascular hypertension (206/99 in preop), Pt. on medications and Pt. on home beta blockers + CAD, +CHF (grade 1 diastolic dysfunction) and + DOE  Normal cardiovascular exam Rhythm:Regular Rate:Normal  Echo 2022:  1. Left ventricular ejection fraction, by estimation, is 60 to 65%. The  left ventricle has normal function. The left ventricle has no regional  wall motion abnormalities. There is mild left ventricular hypertrophy.  Left ventricular diastolic parameters  are consistent with Grade I diastolic dysfunction (impaired relaxation).  The average left ventricular global longitudinal strain is -24.0 %. The  global longitudinal strain is normal.  2. Right ventricular systolic function is normal. The right ventricular  size is normal. Tricuspid regurgitation signal is inadequate for assessing  PA pressure.  3. The mitral valve is normal in structure. Mild mitral valve  regurgitation. No evidence of mitral stenosis.  4. The aortic valve was not well visualized. Aortic valve regurgitation  is trivial. No aortic stenosis is present.  5. There is borderline dilatation of the aortic root, measuring 39 mm.  6. The inferior vena cava is normal in size with greater than 50%  respiratory variability, suggesting right atrial pressure of 3 mmHg.   Stress test 2021: ? The left ventricular ejection fraction is normal (55-65%). ? Nuclear  stress EF: 64%. No wall motion abnormalities ? There was no ST segment deviation noted during stress. ? The study is normal. ? This is a low risk study. There is no infarct or ischemia identified.    Neuro/Psych  Headaches, PSYCHIATRIC DISORDERS Depression    GI/Hepatic Neg liver ROS, hiatal hernia, GERD  Medicated and Controlled,Dysphagia    Endo/Other  Hypothyroidism BMI 31  Renal/GU negative Renal ROS  negative genitourinary   Musculoskeletal  (+) Arthritis , Osteoarthritis,    Abdominal   Peds  Hematology  (+) Blood dyscrasia, anemia ,   Anesthesia Other Findings   Reproductive/Obstetrics negative OB ROS                           Anesthesia Physical Anesthesia Plan  ASA: 3  Anesthesia Plan: MAC   Post-op Pain Management:    Induction:   PONV Risk Score and Plan: 2 and Propofol infusion and TIVA  Airway Management Planned: Natural Airway and Simple Face Mask  Additional Equipment: None  Intra-op Plan:   Post-operative Plan:   Informed Consent: I have reviewed the patients History and Physical, chart, labs and discussed the procedure including the risks, benefits and alternatives for the proposed anesthesia with the patient or authorized representative who has indicated his/her understanding and acceptance.     Dental advisory given  Plan Discussed with: CRNA  Anesthesia Plan Comments:        Anesthesia Quick Evaluation

## 2021-05-04 ENCOUNTER — Other Ambulatory Visit: Payer: Self-pay

## 2021-05-04 ENCOUNTER — Ambulatory Visit (HOSPITAL_COMMUNITY)
Admission: RE | Admit: 2021-05-04 | Discharge: 2021-05-04 | Disposition: A | Discharge status: Home / Self Care | Payer: Medicare Other | Attending: Internal Medicine | Admitting: Internal Medicine

## 2021-05-04 ENCOUNTER — Ambulatory Visit (HOSPITAL_COMMUNITY): Payer: Medicare Other | Admitting: Anesthesiology

## 2021-05-04 ENCOUNTER — Encounter (HOSPITAL_COMMUNITY): Payer: Self-pay | Admitting: Internal Medicine

## 2021-05-04 ENCOUNTER — Encounter (HOSPITAL_COMMUNITY)
Admission: RE | Disposition: A | Discharge status: Home / Self Care | Payer: Self-pay | Source: Home / Self Care | Attending: Internal Medicine

## 2021-05-04 DIAGNOSIS — Z1211 Encounter for screening for malignant neoplasm of colon: Secondary | ICD-10-CM | POA: Diagnosis not present

## 2021-05-04 DIAGNOSIS — D649 Anemia, unspecified: Secondary | ICD-10-CM | POA: Insufficient documentation

## 2021-05-04 DIAGNOSIS — K644 Residual hemorrhoidal skin tags: Secondary | ICD-10-CM | POA: Insufficient documentation

## 2021-05-04 DIAGNOSIS — I509 Heart failure, unspecified: Secondary | ICD-10-CM | POA: Diagnosis not present

## 2021-05-04 DIAGNOSIS — K449 Diaphragmatic hernia without obstruction or gangrene: Secondary | ICD-10-CM | POA: Diagnosis not present

## 2021-05-04 DIAGNOSIS — D122 Benign neoplasm of ascending colon: Secondary | ICD-10-CM

## 2021-05-04 DIAGNOSIS — J45909 Unspecified asthma, uncomplicated: Secondary | ICD-10-CM | POA: Diagnosis not present

## 2021-05-04 DIAGNOSIS — Q399 Congenital malformation of esophagus, unspecified: Secondary | ICD-10-CM | POA: Diagnosis not present

## 2021-05-04 DIAGNOSIS — R519 Headache, unspecified: Secondary | ICD-10-CM | POA: Diagnosis not present

## 2021-05-04 DIAGNOSIS — E039 Hypothyroidism, unspecified: Secondary | ICD-10-CM | POA: Diagnosis not present

## 2021-05-04 DIAGNOSIS — I351 Nonrheumatic aortic (valve) insufficiency: Secondary | ICD-10-CM | POA: Diagnosis not present

## 2021-05-04 DIAGNOSIS — M1990 Unspecified osteoarthritis, unspecified site: Secondary | ICD-10-CM | POA: Diagnosis not present

## 2021-05-04 DIAGNOSIS — K648 Other hemorrhoids: Secondary | ICD-10-CM | POA: Diagnosis not present

## 2021-05-04 DIAGNOSIS — I110 Hypertensive heart disease with heart failure: Secondary | ICD-10-CM | POA: Insufficient documentation

## 2021-05-04 DIAGNOSIS — D124 Benign neoplasm of descending colon: Secondary | ICD-10-CM | POA: Diagnosis not present

## 2021-05-04 DIAGNOSIS — R0600 Dyspnea, unspecified: Secondary | ICD-10-CM | POA: Insufficient documentation

## 2021-05-04 DIAGNOSIS — I340 Nonrheumatic mitral (valve) insufficiency: Secondary | ICD-10-CM | POA: Insufficient documentation

## 2021-05-04 DIAGNOSIS — F32A Depression, unspecified: Secondary | ICD-10-CM | POA: Diagnosis not present

## 2021-05-04 DIAGNOSIS — I2510 Atherosclerotic heart disease of native coronary artery without angina pectoris: Secondary | ICD-10-CM | POA: Insufficient documentation

## 2021-05-04 DIAGNOSIS — K635 Polyp of colon: Secondary | ICD-10-CM | POA: Diagnosis not present

## 2021-05-04 DIAGNOSIS — R1310 Dysphagia, unspecified: Secondary | ICD-10-CM | POA: Diagnosis not present

## 2021-05-04 DIAGNOSIS — K219 Gastro-esophageal reflux disease without esophagitis: Secondary | ICD-10-CM | POA: Insufficient documentation

## 2021-05-04 DIAGNOSIS — R1319 Other dysphagia: Secondary | ICD-10-CM

## 2021-05-04 DIAGNOSIS — K222 Esophageal obstruction: Secondary | ICD-10-CM | POA: Diagnosis not present

## 2021-05-04 DIAGNOSIS — I77819 Aortic ectasia, unspecified site: Secondary | ICD-10-CM | POA: Diagnosis not present

## 2021-05-04 DIAGNOSIS — I071 Rheumatic tricuspid insufficiency: Secondary | ICD-10-CM | POA: Diagnosis not present

## 2021-05-04 HISTORY — PX: COLONOSCOPY WITH PROPOFOL: SHX5780

## 2021-05-04 HISTORY — PX: POLYPECTOMY: SHX5525

## 2021-05-04 HISTORY — PX: ESOPHAGOGASTRODUODENOSCOPY (EGD) WITH PROPOFOL: SHX5813

## 2021-05-04 SURGERY — COLONOSCOPY WITH PROPOFOL
Anesthesia: Monitor Anesthesia Care

## 2021-05-04 MED ORDER — PHENYLEPHRINE 40 MCG/ML (10ML) SYRINGE FOR IV PUSH (FOR BLOOD PRESSURE SUPPORT)
PREFILLED_SYRINGE | INTRAVENOUS | Status: DC | PRN
Start: 2021-05-04 — End: 2021-05-04
  Administered 2021-05-04: 40 ug via INTRAVENOUS

## 2021-05-04 MED ORDER — LIDOCAINE 2% (20 MG/ML) 5 ML SYRINGE
INTRAMUSCULAR | Status: DC | PRN
Start: 2021-05-04 — End: 2021-05-04
  Administered 2021-05-04: 60 mg via INTRAVENOUS

## 2021-05-04 MED ORDER — SODIUM CHLORIDE 0.9 % IV SOLN: INTRAVENOUS | Status: DC

## 2021-05-04 MED ORDER — LACTATED RINGERS IV SOLN: INTRAVENOUS | Status: DC

## 2021-05-04 MED ORDER — PROPOFOL 500 MG/50ML IV EMUL
INTRAVENOUS | Status: DC | PRN
  Administered 2021-05-04: 100 ug/kg/min via INTRAVENOUS

## 2021-05-04 SURGICAL SUPPLY — 25 items

## 2021-05-04 NOTE — Anesthesia Postprocedure Evaluation (Signed)
Anesthesia Post Note  Patient: Hannah Scott  Procedure(s) Performed: COLONOSCOPY WITH PROPOFOL ESOPHAGOGASTRODUODENOSCOPY (EGD) WITH PROPOFOL Balloon dilation wire-guided POLYPECTOMY     Patient location during evaluation: PACU Anesthesia Type: MAC Level of consciousness: awake and alert Pain management: pain level controlled Vital Signs Assessment: post-procedure vital signs reviewed and stable Respiratory status: spontaneous breathing, nonlabored ventilation and respiratory function stable Cardiovascular status: blood pressure returned to baseline and stable Postop Assessment: no apparent nausea or vomiting Anesthetic complications: no   No notable events documented.  Last Vitals:  Vitals:   05/04/21 0830 05/04/21 0840  BP: (!) 127/56 (!) 160/85  Pulse: 83 78  Resp: 20 (!) 22  Temp:    SpO2: 100% 97%    Last Pain:  Vitals:   05/04/21 0840  TempSrc:   PainSc: 0-No pain                 Pervis Hocking

## 2021-05-04 NOTE — H&P (Signed)
[facility] History and Physical   Primary Care Physician:  Binnie Rail, MD   Reason for Procedure:   Dysphagia and colon cancer screening  Plan:    EGD, possible esophageal dilation, colonoscopy     HPI: Hannah Scott is a 71 y.o. female w// severe kyphoscoliosis, hoarseness w/ ? GERD component, small hiatal hernia on bid PPI - she is here for evaluation and possible treatment of GERD and dysphagia and a screening colonoscopy.   Past Medical History:  Diagnosis Date   Anemia    hx   Arthritis    Asthma    PFTs, February, 2011, moderate obstructive disease with response to bronchodilators, normal lung volumes, moderate reduction in diffusing capacity   Atrial septal aneurysm    Echo, 2008-not noted on 13 echo   CAD (coronary artery disease)    90% distal LAD in the past  /   nuclear, 2008, no ischemia, ejection fraction 70%   Cancer ([place]) 2002   DUCTAL CIS--S/P LUMPECTOMY, RADIATION AND 6 WEEKS OF TAMOXIFEN   D-dimer, elevated    January, 2014   Depression    Ejection fraction    EF 60%, echo, October, 2008   Elevated CPK    January, 2014   Endometriosis 1989   RIGHT TUBE   Endometriosis 1987   LEFT TUBE/OVARY W FOCAL IN-SITU ENDOMETRIAL ADENOCARCINOMA   GERD (gastroesophageal reflux disease)    occ   H/O hiatal hernia    ?   Hyperlipidemia    Hypertension    Hypothyroidism    Patient has had in the past that she does not need treatment   Kyphoscoliosis    Obstructive airway disease ([place])    Pinched nerve    lower back   Shortness of breath    Echo 2/22: EF 60-65, no RWMA, mild LVH, GR 1  DD, GLS -24%, normal RVSF, mild MR, trivial AI, borderline dilation of aortic root (39 mm)   UTI (lower urinary tract infection)     Past Surgical History:  Procedure Laterality Date   ANKLE SURGERY Left    ligament   APPENDECTOMY  1987   AT [facility]   BACK SURGERY     Fusion   BREAST LUMPECTOMY  2003   radiation on right   BREAST LUMPECTOMY WITH  RADIOACTIVE SEED LOCALIZATION Left 02/12/2016   Procedure: LEFT BREAST LUMPECTOMY WITH RADIOACTIVE SEED LOCALIZATION;  Surgeon: Excell Seltzer, MD;  Location: [place];  Service: General;  Laterality: Left;   CARDIOVASCULAR STRESS TEST  09/07/05   Nuclear, was negative   CATARACT EXTRACTION Bilateral 01,03   OOPHORECTOMY     LSO -RSO   PELVIC LAPAROSCOPY  1989   RSO, LYSIS OF ADHESIONS   TOTAL ABDOMINAL HYSTERECTOMY  1987   LSO, APPENDECTOMY   TOTAL HIP ARTHROPLASTY Right 10/28/2013   dr Lorin Mercy   TOTAL HIP ARTHROPLASTY Right 10/28/2013   Procedure: TOTAL HIP ARTHROPLASTY ANTERIOR APPROACH;  Surgeon: Marybelle Killings, MD;  Location: [place];  Service: Orthopedics;  Laterality: Right;  Right Total Hip Arthroplasty, Direct Anterior Approach    Prior to Admission medications   Medication Sig Start Date End Date Taking? Authorizing Provider  albuterol (VENTOLIN HFA) 108 (90 Base) MCG/ACT inhaler Inhale 2 puffs into the lungs every 4 (four) hours as needed for wheezing or shortness of breath. 07/13/20  Yes Burns, Claudina Lick, MD  atorvastatin (LIPITOR) 20 MG tablet Take 1 tablet (20 mg total) by mouth daily. 01/11/21  Yes Pemberton,  Greer Ee, MD  bisoprolol (ZEBETA) 5 MG tablet Take 5 mg by mouth daily.   Yes [provider]  DULoxetine (CYMBALTA) 30 MG capsule TAKE ONE CAPSULE EVERY DAY IN ADDITION TO 60MG  CAPSULE FOR A TOTAL OF 90MG  DAILY 12/02/20  Yes Burns, Claudina Lick, MD  DULoxetine (CYMBALTA) 60 MG capsule TAKE ONE CAPSULE BY MOUTH DAILY ALONG WITH 30MG  CAPSULE 04/01/21  Yes Burns, Claudina Lick, MD  ergocalciferol (VITAMIN D2) 1.25 MG (50000 UT) capsule Take 50,000 Units by mouth every Wednesday. 03/12/21  Yes [provider]  famotidine (PEPCID) 20 MG tablet One after supper Patient taking differently: Take 20 mg by mouth every evening. One after supper 09/01/20  Yes Tanda Rockers, MD  fluticasone furoate-vilanterol (BREO ELLIPTA) 100-25 MCG/INH AEPB Inhale 1 puff into the  lungs daily. 01/11/21  Yes Burns, Claudina Lick, MD  gabapentin (NEURONTIN) 600 MG tablet TAKE ONE TABLET EVERY DAY AT BEDTIME 04/01/21  Yes Bayard Hugger, NP  HYDROcodone-acetaminophen (NORCO) 10-325 MG tablet Take 1 tablet by mouth every 8 (eight) hours as needed. Do Not Fill Before 04/27/2021 04/22/21  Yes Bayard Hugger, NP  irbesartan (AVAPRO) 150 MG tablet Take 150 mg by mouth daily.   Yes [provider]  levothyroxine (SYNTHROID) 75 MCG tablet TAKE 1 TABLET(75 MCG) BY MOUTH DAILY 12/24/20  Yes Burns, Claudina Lick, MD  pantoprazole (PROTONIX) 40 MG tablet TAKE 1 TABLET(40 MG) BY MOUTH DAILY 30 TO 60 MINUTES BEFORE FIRST MEAL OF THE DAY 03/01/21  Yes Tanda Rockers, MD  aspirin EC 81 MG tablet Take 1 tablet (81 mg total) by mouth daily. Swallow whole. Patient not taking: Reported on 04/28/2021 06/05/20   Richardson Dopp T, PA-C  ipratropium-albuterol (DUONEB) 0.5-2.5 (3) MG/3ML SOLN Take 3 mLs by nebulization every 4 (four) hours as needed (wheezing or SOB). During exacerbation 03/01/19   Binnie Rail, MD    Current Facility-Administered Medications  Medication Dose Route Frequency Provider Last Rate Last Admin   lactated ringers infusion   Intravenous Continuous Gatha Mayer, MD 20 mL/hr at 05/04/21 0736 Continued from Pre-op at 05/04/21 0736    Allergies as of 03/05/2021 - Review Complete 02/10/2021  Allergen Reaction Noted   Fluticasone-salmeterol Other (See Comments) 04/09/2007   Norvasc [amlodipine besylate] Swelling and Other (See Comments) 10/20/2014   Tape Other (See Comments) 10/23/2013    Family History  Problem Relation Age of Onset   Heart failure Mother    Pneumonia Mother    Hypertension Mother    Heart disease Mother    Stroke Maternal Grandfather    Hypertension Maternal Grandfather    Heart attack Father    Heart disease Father    Asthma Paternal Uncle        PAT UNCLES   Heart attack Paternal Uncle     Social History   Socioeconomic History   Marital  status: Widowed    Spouse name: Not on file   Number of children: Not on file   Years of education: Not on file   Highest education level: Not on file  Occupational History   Not on file  Tobacco Use   Smoking status: Never   Smokeless tobacco: Never  Vaping Use   Vaping Use: Never used  Substance and Sexual Activity   Alcohol use: No    Alcohol/week: 0.0 standard drinks   Drug use: No   Sexual activity: Not Currently    Birth control/protection: Surgical  Other Topics Concern   Not  on file  Social History Narrative   Widowed in 2015   2 sons 1 lives in the area the other is in close contact    1 grandchild   Never smoker no drug use no alcohol   Social Determinants of Radio broadcast assistant Strain: Low Risk    Difficulty of Paying Living Expenses: Not hard at all  Food Insecurity: No Food Insecurity   Worried About Charity fundraiser in the Last Year: Never true   Arboriculturist in the Last Year: Never true  Transportation Needs: No Transportation Needs   Lack of Transportation (Medical): No   Lack of Transportation (Non-Medical): No  Physical Activity: Inactive   Days of Exercise per Week: 0 days   Minutes of Exercise per Session: 0 min  Stress: No Stress Concern Present   Feeling of Stress : Not at all  Social Connections: Moderately Integrated   Frequency of Communication with Friends and Family: More than three times a week   Frequency of Social Gatherings with Friends and Family: More than three times a week   Attends Religious Services: More than 4 times per year   Active Member of Clubs or Organizations: No   Attends Music therapist: More than 4 times per year   Marital Status: Widowed  Human resources officer Violence: Not At Risk   Fear of Current or Ex-Partner: No   Emotionally Abused: No   Physically Abused: No   Sexually Abused: No    Review of Systems:  All other review of systems negative except as mentioned in the HPI.  Physical  Exam: Vital signs BP (!) 212/94    Pulse 75    Temp 98.1 F (36.7 C) (Oral)    Resp (!) 22    Ht 5' (1.524 m)    Wt 72.1 kg    SpO2 96%    BMI 31.05 kg/m   General:   Alert,  Well-developed, well-nourished, pleasant and cooperative in NAD Lungs:  Clear throughout to auscultation.   Heart:  Regular rate and rhythm; no murmurs, clicks, rubs,  or gallops. Abdomen:  Soft, nontender and nondistended. Normal bowel sounds.   Neuro/Psych:  Alert and cooperative. Normal mood and affect. A and O x 3   @Martavion Couper  Simonne Maffucci, MD, [facility] [phone] (pager) 05/04/2021 7:38 AM@

## 2021-05-04 NOTE — Op Note (Signed)
[facility] Patient Name: Hannah Scott Procedure Date: 05/04/2021 MRN: [id number] Attending MD: Gatha Mayer , MD Date of Birth: Jan 09, 1951 CSN: [id number] Age: 71 Admit Type: Outpatient Procedure:                Colonoscopy Indications:              Screening for colorectal malignant neoplasm Providers:                Gatha Mayer, MD, Mikey College, RN, Benetta Spar, Technician Referring MD:              Medicines:                Propofol per Anesthesia, Monitored Anesthesia Care Complications:            No immediate complications. Estimated Blood Loss:     Estimated blood loss was minimal. Procedure:                Pre-Anesthesia Assessment:                           - Prior to the procedure, a History and Physical                            was performed, and patient medications and                            allergies were reviewed. The patient's tolerance of                            previous anesthesia was also reviewed. The risks                            and benefits of the procedure and the sedation                            options and risks were discussed with the patient.                            All questions were answered, and informed consent                            was obtained. Prior Anticoagulants: The patient has                            taken no previous anticoagulant or antiplatelet                            agents. ASA Grade Assessment: III - A patient with                            severe systemic disease. After reviewing the risks  and benefits, the patient was deemed in                            satisfactory condition to undergo the procedure.                           After obtaining informed consent, the colonoscope                            was passed under direct vision. Throughout the                            procedure, the patient's blood pressure, pulse, and                             oxygen saturations were monitored continuously. The                            CF-HQ190L ([id number]) Olympus colonoscope was                            introduced through the anus and advanced to the the                            cecum, identified by appendiceal orifice and                            ileocecal valve. The colonoscopy was performed                            without difficulty. The patient tolerated the                            procedure well. The quality of the bowel                            preparation was good. The ileocecal valve,                            appendiceal orifice, and rectum were photographed. Scope In: 8:02:53 AM Scope Out: 8:17:12 AM Scope Withdrawal Time: 0 hours 11 minutes 17 seconds  Total Procedure Duration: 0 hours 14 minutes 19 seconds  Findings:      The perianal and digital rectal examinations were normal.      Two sessile polyps were found in the descending colon and ascending       colon. The polyps were diminutive in size. These polyps were removed       with a cold snare. Resection and retrieval were complete. Verification       of patient identification for the specimen was done. Estimated blood       loss was minimal.      External and internal hemorrhoids were found.      The exam was otherwise without abnormality on direct and retroflexion       views. Impression:               -  Two diminutive polyps in the descending colon and                            in the ascending colon, removed with a cold snare.                            Resected and retrieved.                           - External and internal hemorrhoids.                           - The examination was otherwise normal on direct                            and retroflexion views. Moderate Sedation:      Not Applicable - Patient had care per Anesthesia. Recommendation:           - Patient has a contact number available for                             emergencies. The signs and symptoms of potential                            delayed complications were discussed with the                            patient. Return to normal activities tomorrow.                            Written discharge instructions were provided to the                            patient.                           - Resume previous diet.                           - Continue present medications.                           - No recommendation at this time regarding repeat                            colonoscopy due to age.                           - Await pathology results. Procedure Code(s):        --- Professional ---                           [phone], Colonoscopy, flexible; with removal of                            tumor(s), polyp(s), or other lesion(s) by snare  technique Diagnosis Code(s):        --- Professional ---                           Z12.11, Encounter for screening for malignant                            neoplasm of colon                           K63.5, Polyp of colon                           K64.8, Other hemorrhoids CPT copyright 2019 [employer]. All rights reserved. The codes documented in this report are preliminary and upon coder review may  be revised to meet current compliance requirements. Gatha Mayer, MD 05/04/2021 8:36:43 AM This report has been signed electronically. Number of Addenda: 0

## 2021-05-04 NOTE — Discharge Instructions (Addendum)
I dilated the esophagus - I hope that helps you swallow better.  Found and removed 2 tiny benign-appearing polyps. Will get them analyzed and let you know if/when to repeat colonoscopy.  We will make a referral for hoarseness - should hear from my office.  I appreciate the opportunity to care for you. Gatha Mayer, MD, FACG   YOU HAD AN ENDOSCOPIC PROCEDURE TODAY: Refer to the procedure report and other information in the discharge instructions given to you for any specific questions about what was found during the examination. If this information does not answer your questions, please call Dr. Celesta Aver office at [phone] to clarify.   YOU SHOULD EXPECT: Some feelings of bloating in the abdomen. Passage of more gas than usual. Walking can help get rid of the air that was put into your GI tract during the procedure and reduce the bloating. If you had a lower endoscopy (such as a colonoscopy or flexible sigmoidoscopy) you may notice spotting of blood in your stool or on the toilet paper. Some abdominal soreness may be present for a day or two, also.  DIET: Clear liquids only until 930 then soft foods today and normal tomorrow. No alcohol until tomorrow.  ACTIVITY: Your care partner should take you home directly after the procedure. You should plan to take it easy, moving slowly for the rest of the day. You can resume normal activity the day after the procedure however YOU SHOULD NOT DRIVE, use power tools, machinery or perform tasks that involve climbing or major physical exertion for 24 hours (because of the sedation medicines used during the test).   SYMPTOMS TO REPORT IMMEDIATELY: A gastroenterologist can be reached at any hour. Please call [phone]  for any of the following symptoms:  Following lower endoscopy (colonoscopy, flexible sigmoidoscopy) Excessive amounts of blood in the stool  Significant tenderness, worsening of abdominal pains  Swelling of the abdomen that is new,  acute  Fever of 100 or higher  Following upper endoscopy (EGD, EUS, ERCP, esophageal dilation) Vomiting of blood or coffee ground material  New, significant abdominal pain  New, significant chest pain or pain under the shoulder blades  Painful or persistently difficult swallowing  New shortness of breath  Black, tarry-looking or red, bloody stools  FOLLOW UP:  If any biopsies were taken you will be contacted by phone or by letter within the next 1-3 weeks. Call [phone]  if you have not heard about the biopsies in 3 weeks.  Please also call with any specific questions about appointments or follow up tests.

## 2021-05-04 NOTE — Anesthesia Procedure Notes (Signed)
Procedure Name: MAC Date/Time: 05/04/2021 7:49 AM Performed by: Deliah Boston, CRNA Pre-anesthesia Checklist: Patient identified, Emergency Drugs available, Suction available and Patient being monitored Patient Re-evaluated:Patient Re-evaluated prior to induction Oxygen Delivery Method: Simple face mask Preoxygenation: Pre-oxygenation with 100% oxygen Induction Type: IV induction Placement Confirmation: positive ETCO2 and breath sounds checked- equal and bilateral

## 2021-05-04 NOTE — Transfer of Care (Signed)
Immediate Anesthesia Transfer of Care Note  Patient: Hannah Scott  Procedure(s) Performed: Procedure(s): COLONOSCOPY WITH PROPOFOL (N/A) ESOPHAGOGASTRODUODENOSCOPY (EGD) WITH PROPOFOL (N/A) Balloon dilation wire-guided POLYPECTOMY  Patient Location: PACU  Anesthesia Type:MAC  Level of Consciousness: Patient easily awoken, sedated, comfortable, cooperative, following commands, responds to stimulation.   Airway & Oxygen Therapy: Patient spontaneously breathing, ventilating well, oxygen via simple oxygen mask.  Post-op Assessment: Report given to PACU RN, vital signs reviewed and stable, moving all extremities.   Post vital signs: Reviewed and stable.  Complications: No apparent anesthesia complications Last Vitals:  Vitals Value Taken Time  BP    Temp    Pulse    Resp    SpO2      Last Pain:  Vitals:   05/04/21 0706  TempSrc: Oral  PainSc: 0-No pain         Complications: No notable events documented.

## 2021-05-04 NOTE — Op Note (Signed)
[facility] Patient Name: Hannah Scott Procedure Date: 05/04/2021 MRN: [id number] Attending MD: Gatha Mayer , MD Date of Birth: 18-May-1950 CSN: [id number] Age: 71 Admit Type: Outpatient Procedure:                Upper GI endoscopy Indications:              Dysphagia Providers:                Gatha Mayer, MD, Mikey College, RN, Benetta Spar, Technician Referring MD:              Medicines:                Propofol per Anesthesia, Monitored Anesthesia Care Complications:            No immediate complications. Estimated Blood Loss:     Estimated blood loss was minimal. Procedure:                Pre-Anesthesia Assessment:                           - Prior to the procedure, a History and Physical                            was performed, and patient medications and                            allergies were reviewed. The patient's tolerance of                            previous anesthesia was also reviewed. The risks                            and benefits of the procedure and the sedation                            options and risks were discussed with the patient.                            All questions were answered, and informed consent                            was obtained. Prior Anticoagulants: The patient has                            taken no previous anticoagulant or antiplatelet                            agents. ASA Grade Assessment: III - A patient with                            severe systemic disease. After reviewing the risks  and benefits, the patient was deemed in                            satisfactory condition to undergo the procedure.                           After obtaining informed consent, the endoscope was                            passed under direct vision. Throughout the                            procedure, the patient's blood pressure, pulse, and                             oxygen saturations were monitored continuously. The                            [id number]-[id number] ([id number]) Olympus endoscope was introduced                            through the mouth, and advanced to the second part                            of duodenum. The upper GI endoscopy was                            accomplished without difficulty. The patient                            tolerated the procedure well. Scope In: Scope Out: Findings:      The examined esophagus was moderately tortuous.      One benign-appearing, intrinsic mild stenosis was found at the       gastroesophageal junction. The stenosis was traversed. A TTS dilator was       passed through the scope. Dilation with an 18-19-20 mm balloon dilator       was performed to 18 mm. The dilation site was examined and showed mild       mucosal disruption. Estimated blood loss was minimal.      A 5 cm hiatal hernia was present.      The exam was otherwise without abnormality.      The cardia and gastric fundus were normal on retroflexion. Impression:               - Tortuous esophagus. Suspect some dysmotility vs                            effects of kyphoscoliosis                           - Benign-appearing esophageal stenosis. Dilated. 10                            mm                           -  5 cm hiatal hernia.                           - The examination was otherwise normal.                           - No specimens collected. Moderate Sedation:      Not Applicable - Patient had care per Anesthesia. Recommendation:           - Patient has a contact number available for                            emergencies. The signs and symptoms of potential                            delayed complications were discussed with the                            patient. Return to normal activities tomorrow.                            Written discharge instructions were provided to the                            patient.                           -  Clear liquids x 1 hour then soft foods rest of                            day. Start prior diet tomorrow.                           - Continue present medications.                           - See the other procedure note for documentation of                            additional recommendations.                           - Refer to Dr. Denyse Dago or Dr. Johnna Acosta                            at [place] [facility] re: hoarseness Procedure Code(s):        --- Professional ---                           [phone], Esophagogastroduodenoscopy, flexible,                            transoral; with transendoscopic balloon dilation of                            esophagus (less than  30 mm diameter) Diagnosis Code(s):        --- Professional ---                           Q39.9, Congenital malformation of esophagus,                            unspecified                           K22.2, Esophageal obstruction                           K44.9, Diaphragmatic hernia without obstruction or                            gangrene                           R13.10, Dysphagia, unspecified CPT copyright 2019 American [employer] Association. All rights reserved. The codes documented in this report are preliminary and upon coder review may  be revised to meet current compliance requirements. Gatha Mayer, MD 05/04/2021 8:34:00 AM This report has been signed electronically. Number of Addenda: 0

## 2021-05-05 LAB — SURGICAL PATHOLOGY

## 2021-05-06 ENCOUNTER — Encounter: Payer: Self-pay | Admitting: Internal Medicine

## 2021-05-07 ENCOUNTER — Encounter (HOSPITAL_COMMUNITY): Payer: Self-pay | Admitting: Internal Medicine

## 2021-05-24 DIAGNOSIS — H34832 Tributary (branch) retinal vein occlusion, left eye, with macular edema: Secondary | ICD-10-CM | POA: Diagnosis not present

## 2021-05-24 DIAGNOSIS — H35033 Hypertensive retinopathy, bilateral: Secondary | ICD-10-CM | POA: Diagnosis not present

## 2021-05-24 DIAGNOSIS — H3582 Retinal ischemia: Secondary | ICD-10-CM | POA: Diagnosis not present

## 2021-05-24 DIAGNOSIS — H3562 Retinal hemorrhage, left eye: Secondary | ICD-10-CM | POA: Diagnosis not present

## 2021-05-25 ENCOUNTER — Encounter: Payer: Self-pay | Admitting: Registered Nurse

## 2021-05-25 ENCOUNTER — Encounter: Payer: Medicare Other | Attending: Physical Medicine & Rehabilitation | Admitting: Registered Nurse

## 2021-05-25 ENCOUNTER — Other Ambulatory Visit: Payer: Self-pay

## 2021-05-25 VITALS — BP 148/89 | HR 79 | Ht 60.0 in | Wt 158.0 lb

## 2021-05-25 DIAGNOSIS — G894 Chronic pain syndrome: Secondary | ICD-10-CM | POA: Diagnosis not present

## 2021-05-25 DIAGNOSIS — M24551 Contracture, right hip: Secondary | ICD-10-CM | POA: Insufficient documentation

## 2021-05-25 DIAGNOSIS — Z5181 Encounter for therapeutic drug level monitoring: Secondary | ICD-10-CM | POA: Insufficient documentation

## 2021-05-25 DIAGNOSIS — M961 Postlaminectomy syndrome, not elsewhere classified: Secondary | ICD-10-CM | POA: Diagnosis not present

## 2021-05-25 DIAGNOSIS — M48062 Spinal stenosis, lumbar region with neurogenic claudication: Secondary | ICD-10-CM | POA: Insufficient documentation

## 2021-05-25 DIAGNOSIS — Z79891 Long term (current) use of opiate analgesic: Secondary | ICD-10-CM | POA: Insufficient documentation

## 2021-05-25 MED ORDER — HYDROCODONE-ACETAMINOPHEN 10-325 MG PO TABS: 1.0000 | ORAL_TABLET | Freq: Three times a day (TID) | ORAL | 0 refills | Status: DC | PRN

## 2021-05-25 NOTE — Progress Notes (Addendum)
Subjective:    Patient ID: Hannah Scott, female    DOB: 1950-07-08, 71 y.o.   MRN: [id number]  HPI: Hannah Scott is a 71 y.o. female who is scheduled for a Telephone visit, we have  discussed the limitations of evaluation and management by telemedicine and the availability of in person appointments. The patient expressed understanding and agreed to proceed. She states her pain is located in her lower back. She rates her pain 7. Her current exercise regime is walking with her walker.  Ms. Fox Morphine equivalent is 33.33 MME.   Last Oral Swab was Performed on 10/27/2020, it was consistent.    Pain Inventory Average Pain 7 Pain Right Now 7 My pain is constant, burning, tingling, and aching  In the last 24 hours, has pain interfered with the following? General activity 7 Relation with others 7 Enjoyment of life 7 What TIME of day is your pain at its worst? morning , daytime, evening, and night Sleep (in general) Poor  Pain is worse with: walking, standing, and some activites Pain improves with: rest and medication Relief from Meds: 4  Family History  Problem Relation Age of Onset   Heart failure Mother    Pneumonia Mother    Hypertension Mother    Heart disease Mother    Stroke Maternal Grandfather    Hypertension Maternal Grandfather    Heart attack Father    Heart disease Father    Asthma Paternal Uncle        PAT UNCLES   Heart attack Paternal Uncle    Social History   Socioeconomic History   Marital status: Widowed    Spouse name: Not on file   Number of children: Not on file   Years of education: Not on file   Highest education level: Not on file  Occupational History   Not on file  Tobacco Use   Smoking status: Never   Smokeless tobacco: Never  Vaping Use   Vaping Use: Never used  Substance and Sexual Activity   Alcohol use: No    Alcohol/week: 0.0 standard drinks   Drug use: No   Sexual activity: Not Currently    Birth control/protection: Surgical  Other  Topics Concern   Not on file  Social History Narrative   Widowed in 2015   2 sons 1 lives in the area the other is in close contact    1 grandchild   Never smoker no drug use no alcohol   Social Determinants of Radio broadcast assistant Strain: Low Risk    Difficulty of Paying Living Expenses: Not hard at all  Food Insecurity: No Food Insecurity   Worried About Charity fundraiser in the Last Year: Never true   [place] in the Last Year: Never true  Transportation Needs: No Transportation Needs   Lack of Transportation (Medical): No   Lack of Transportation (Non-Medical): No  Physical Activity: Inactive   Days of Exercise per Week: 0 days   Minutes of Exercise per Session: 0 min  Stress: No Stress Concern Present   Feeling of Stress : Not at all  Social Connections: Moderately Integrated   Frequency of Communication with Friends and Family: More than three times a week   Frequency of Social Gatherings with Friends and Family: More than three times a week   Attends Religious Services: More than 4 times per year   Active Member of Clubs or Organizations: No   Attends [employer]  or Organization Meetings: More than 4 times per year   Marital Status: Widowed   Past Surgical History:  Procedure Laterality Date   ANKLE SURGERY Left    ligament   APPENDECTOMY  1987   AT [facility]   BACK SURGERY     Fusion   BREAST LUMPECTOMY  2003   radiation on right   BREAST LUMPECTOMY WITH RADIOACTIVE SEED LOCALIZATION Left 02/12/2016   Procedure: LEFT BREAST LUMPECTOMY WITH RADIOACTIVE SEED LOCALIZATION;  Surgeon: Excell Seltzer, MD;  Location: [place];  Service: General;  Laterality: Left;   CARDIOVASCULAR STRESS TEST  09/07/05   Nuclear, was negative   CATARACT EXTRACTION Bilateral 01,03   COLONOSCOPY WITH PROPOFOL N/A 05/04/2021   Procedure: COLONOSCOPY WITH PROPOFOL;  Surgeon: Gatha Mayer, MD;  [facility];  Service: Endoscopy;  Laterality: N/A;    ESOPHAGOGASTRODUODENOSCOPY (EGD) WITH PROPOFOL N/A 05/04/2021   Procedure: ESOPHAGOGASTRODUODENOSCOPY (EGD) WITH PROPOFOL;  Surgeon: Gatha Mayer, MD;  [facility];  Service: Endoscopy;  Laterality: N/A;   OOPHORECTOMY     [facility] -RSO   PELVIC LAPAROSCOPY  1989   RSO, LYSIS OF ADHESIONS   POLYPECTOMY  05/04/2021   Procedure: POLYPECTOMY;  Surgeon: Gatha Mayer, MD;  [facility];  Service: Endoscopy;;   TOTAL ABDOMINAL HYSTERECTOMY  1987   LSO, APPENDECTOMY   TOTAL HIP ARTHROPLASTY Right 10/28/2013   dr Lorin Mercy   TOTAL HIP ARTHROPLASTY Right 10/28/2013   Procedure: TOTAL HIP ARTHROPLASTY ANTERIOR APPROACH;  Surgeon: Marybelle Killings, MD;  Location: [facility];  Service: Orthopedics;  Laterality: Right;  Right Total Hip Arthroplasty, Direct Anterior Approach   Past Surgical History:  Procedure Laterality Date   ANKLE SURGERY Left    ligament   APPENDECTOMY  1987   AT [facility]   BACK SURGERY     Fusion   BREAST LUMPECTOMY  2003   radiation on right   BREAST LUMPECTOMY WITH RADIOACTIVE SEED LOCALIZATION Left 02/12/2016   Procedure: LEFT BREAST LUMPECTOMY WITH RADIOACTIVE SEED LOCALIZATION;  Surgeon: Excell Seltzer, MD;  Location: [facility];  Service: General;  Laterality: Left;   CARDIOVASCULAR STRESS TEST  09/07/05   Nuclear, was negative   CATARACT EXTRACTION Bilateral 01,03   COLONOSCOPY WITH PROPOFOL N/A 05/04/2021   Procedure: COLONOSCOPY WITH PROPOFOL;  Surgeon: Gatha Mayer, MD;  [facility];  Service: Endoscopy;  Laterality: N/A;   ESOPHAGOGASTRODUODENOSCOPY (EGD) WITH PROPOFOL N/A 05/04/2021   Procedure: ESOPHAGOGASTRODUODENOSCOPY (EGD) WITH PROPOFOL;  Surgeon: Gatha Mayer, MD;  [facility];  Service: Endoscopy;  Laterality: N/A;   OOPHORECTOMY     LSO -RSO   PELVIC LAPAROSCOPY  1989   RSO, LYSIS OF ADHESIONS   POLYPECTOMY  05/04/2021   Procedure: POLYPECTOMY;  Surgeon: Gatha Mayer, MD;  [facility];  Service:  Endoscopy;;   TOTAL ABDOMINAL HYSTERECTOMY  1987   LSO, APPENDECTOMY   TOTAL HIP ARTHROPLASTY Right 10/28/2013   dr Lorin Mercy   TOTAL HIP ARTHROPLASTY Right 10/28/2013   Procedure: TOTAL HIP ARTHROPLASTY ANTERIOR APPROACH;  Surgeon: Marybelle Killings, MD;  Location: [facility];  Service: Orthopedics;  Laterality: Right;  Right Total Hip Arthroplasty, Direct Anterior Approach   Past Medical History:  Diagnosis Date   Anemia    hx   Arthritis    Asthma    PFTs, February, 2011, moderate obstructive disease with response to bronchodilators, normal lung volumes, moderate reduction in diffusing capacity   Atrial septal aneurysm    Echo,  2008-not noted on 13 echo   CAD (coronary artery disease)    90% distal LAD in the past  /   nuclear, 2008, no ischemia, ejection fraction 70%   Cancer ([place]) 2002   DUCTAL CIS--S/P LUMPECTOMY, RADIATION AND 6 WEEKS OF TAMOXIFEN   D-dimer, elevated    January, 2014   Depression    Ejection fraction    EF 60%, echo, October, 2008   Elevated CPK    January, 2014   Endometriosis 1989   RIGHT TUBE   Endometriosis 1987   LEFT TUBE/OVARY W FOCAL IN-SITU ENDOMETRIAL ADENOCARCINOMA   GERD (gastroesophageal reflux disease)    occ   H/O hiatal hernia    ?   Hyperlipidemia    Hypertension    Hypothyroidism    Patient has had in the past that she does not need treatment   Kyphoscoliosis    Obstructive airway disease ([place])    Pinched nerve    lower back   Shortness of breath    Echo 2/22: EF 60-65, no RWMA, mild LVH, GR 1  DD, GLS -24%, normal RVSF, mild MR, trivial AI, borderline dilation of aortic root (39 mm)   UTI (lower urinary tract infection)    BP (!) 148/89 Comment: pt states, virtual visit   Pulse 79 Comment: pt states, virtual visit   Ht 5' (1.524 m) Comment: pt states, virtual visit   Wt 158 lb (71.7 kg) Comment: pt states, virtual visit   BMI 30.86 kg/m   Opioid Risk Score:   Fall Risk Score:  `1  Depression screen PHQ 2/9  Depression screen [facility] 2/9  05/25/2021 03/11/2021 02/10/2021 11/27/2020 10/27/2020 10/08/2020 09/24/2020  Decreased Interest 0 3 3 1 1 2 2   Down, Depressed, Hopeless 1 3 3 1 1 2 2   PHQ - 2 Score 1 6 6 2 2 4 4   Altered sleeping - - - - - - -  Tired, decreased energy - - - - - - -  Change in appetite - - - - - - -  Feeling bad or failure about yourself  - - - - - - -  Trouble concentrating - - - - - - -  Moving slowly or fidgety/restless - - - - - - -  Suicidal thoughts - - - - - - -  PHQ-9 Score - - - - - - -  Difficult doing work/chores - - - - - - -  Some recent data might be hidden      Review of Systems  HENT:  Positive for voice change.   Musculoskeletal:  Positive for back pain.       Spasms  Neurological:  Positive for numbness.  All other systems reviewed and are negative.     Objective:   Physical Exam Vitals and nursing note reviewed.  Musculoskeletal:     Comments: No Physical Exam Performed: Telephone Visit         Assessment & Plan:  1. Lumbar post laminectomy syndrome with severe kyphoscoliosis thoracolumbar spine, s/p lumbar fusion/  05/25/2021 Continue current medication regimen. Refilled: Hydrocodone 10/325mg  one tablet every 8 hours may take an extra tablet when pain is severe #100. We will continue the opioid monitoring program, this consists of regular clinic visits, examinations, urine drug screen, pill counts as well as use of [place] Controlled Substance Reporting system. A 12 month History has been reviewed on the [place] Controlled Substance Reporting System on 05/25/2021 2. Right Hip OA: S/P Right  Hip Replacement 10/28/2013. 05/25/2021 3.Depression: Continue Cymbalta. Has Family Support and Friends.  Counseling with Doristine Bosworth. 05/25/2021. 4. Right Greater Trochanteric Tenderness: No complaints today. Continue to Monitor. Continue with Ice and Heat Therapy. 05/25/2021    F/U in 1 month Telephone Visit Established Patient Location of Patient: In her Home Location of  Provider: In the Office  Total Time Spent: 10 Minutes

## 2021-05-28 ENCOUNTER — Other Ambulatory Visit: Payer: Self-pay | Admitting: Internal Medicine

## 2021-05-28 DIAGNOSIS — R0609 Other forms of dyspnea: Secondary | ICD-10-CM

## 2021-05-31 ENCOUNTER — Other Ambulatory Visit: Payer: Self-pay | Admitting: Internal Medicine

## 2021-06-03 DIAGNOSIS — H34832 Tributary (branch) retinal vein occlusion, left eye, with macular edema: Secondary | ICD-10-CM | POA: Diagnosis not present

## 2021-06-09 DIAGNOSIS — H34832 Tributary (branch) retinal vein occlusion, left eye, with macular edema: Secondary | ICD-10-CM | POA: Diagnosis not present

## 2021-06-22 ENCOUNTER — Encounter: Payer: Self-pay | Admitting: Registered Nurse

## 2021-06-22 ENCOUNTER — Other Ambulatory Visit: Payer: Self-pay

## 2021-06-22 ENCOUNTER — Encounter: Payer: Medicare Other | Attending: Physical Medicine & Rehabilitation | Admitting: Registered Nurse

## 2021-06-22 VITALS — BP 159/97 | HR 81 | Temp 97.8°F | Ht 60.0 in | Wt 158.0 lb

## 2021-06-22 DIAGNOSIS — G894 Chronic pain syndrome: Secondary | ICD-10-CM | POA: Diagnosis not present

## 2021-06-22 DIAGNOSIS — Z5181 Encounter for therapeutic drug level monitoring: Secondary | ICD-10-CM

## 2021-06-22 DIAGNOSIS — M961 Postlaminectomy syndrome, not elsewhere classified: Secondary | ICD-10-CM

## 2021-06-22 DIAGNOSIS — R630 Anorexia: Secondary | ICD-10-CM

## 2021-06-22 DIAGNOSIS — Z79891 Long term (current) use of opiate analgesic: Secondary | ICD-10-CM | POA: Diagnosis not present

## 2021-06-22 DIAGNOSIS — M48062 Spinal stenosis, lumbar region with neurogenic claudication: Secondary | ICD-10-CM

## 2021-06-22 DIAGNOSIS — R54 Age-related physical debility: Secondary | ICD-10-CM | POA: Diagnosis not present

## 2021-06-22 DIAGNOSIS — G8929 Other chronic pain: Secondary | ICD-10-CM

## 2021-06-22 DIAGNOSIS — M24551 Contracture, right hip: Secondary | ICD-10-CM | POA: Diagnosis not present

## 2021-06-22 DIAGNOSIS — M25562 Pain in left knee: Secondary | ICD-10-CM

## 2021-06-22 MED ORDER — HYDROCODONE-ACETAMINOPHEN 10-325 MG PO TABS: 1.0000 | ORAL_TABLET | Freq: Three times a day (TID) | ORAL | 0 refills | Status: DC | PRN

## 2021-06-22 NOTE — Progress Notes (Signed)
Subjective:    Patient ID: Hannah Scott, female    DOB: 1950/10/11, 71 y.o.   MRN: [id number]  HPI: Hannah Scott is a 71 y.o. female who returns for follow up appointment for chronic pain and medication refill. She states her pain is located in her lower back and left knee. She rates her pain 7. Her current exercise regime is walking in her home with walker.  [name] arrived to office late, and is frail. She stated "she had a hard time getting going this morning". She drove herself to the office. This provider asked if I could call her son to pick her up, she refused. She was instructed to call office when she gets home, she verbalizes understanding.   [name] Morphine equivalent is 33.33 MME.   Oral Swab was Performed today.      Pain Inventory Average Pain 7 Pain Right Now 7 My pain is constant, sharp, burning, tingling, and aching  In the last 24 hours, has pain interfered with the following? General activity 10 Relation with others 10 Enjoyment of life 10 What TIME of day is your pain at its worst? morning , daytime, evening, and night Sleep (in general) Good  Pain is worse with: walking, bending, and standing Pain improves with: rest, heat/ice, and medication Relief from Meds: 5     Family History  Problem Relation Age of Onset   Heart failure Mother    Pneumonia Mother    Hypertension Mother    Heart disease Mother    Stroke Maternal Grandfather    Hypertension Maternal Grandfather    Heart attack Father    Heart disease Father    Asthma Paternal Uncle        PAT UNCLES   Heart attack Paternal Uncle    Social History   Socioeconomic History   Marital status: Widowed    Spouse name: Not on file   Number of children: Not on file   Years of education: Not on file   Highest education level: Not on file  Occupational History   Not on file  Tobacco Use   Smoking status: Never   Smokeless tobacco: Never  Vaping Use   Vaping Use: Never used  Substance and  Sexual Activity   Alcohol use: No    Alcohol/week: 0.0 standard drinks   Drug use: No   Sexual activity: Not Currently    Birth control/protection: Surgical  Other Topics Concern   Not on file  Social History Narrative   Widowed in 2015   2 sons 1 lives in the area the other is in close contact    1 grandchild   Never smoker no drug use no alcohol   Social Determinants of Radio broadcast assistant Strain: Low Risk    Difficulty of Paying Living Expenses: Not hard at all  Food Insecurity: No Food Insecurity   Worried About Charity fundraiser in the Last Year: Never true   [place] in the Last Year: Never true  Transportation Needs: No Transportation Needs   Lack of Transportation (Medical): No   Lack of Transportation (Non-Medical): No  Physical Activity: Inactive   Days of Exercise per Week: 0 days   Minutes of Exercise per Session: 0 min  Stress: No Stress Concern Present   Feeling of Stress : Not at all  Social Connections: Moderately Integrated   Frequency of Communication with Friends and Family: More than three times a week  Frequency of Social Gatherings with Friends and Family: More than three times a week   Attends Religious Services: More than 4 times per year   Active Member of Clubs or Organizations: No   Attends Music therapist: More than 4 times per year   Marital Status: Widowed   Past Surgical History:  Procedure Laterality Date   ANKLE SURGERY Left    ligament   APPENDECTOMY  1987   AT [facility]   BACK SURGERY     Fusion   BREAST LUMPECTOMY  2003   radiation on right   BREAST LUMPECTOMY WITH RADIOACTIVE SEED LOCALIZATION Left 02/12/2016   Procedure: LEFT BREAST LUMPECTOMY WITH RADIOACTIVE SEED LOCALIZATION;  Surgeon: Excell Seltzer, MD;  Location: [place];  Service: General;  Laterality: Left;   CARDIOVASCULAR STRESS TEST  09/07/05   Nuclear, was negative   CATARACT EXTRACTION Bilateral 01,03   COLONOSCOPY WITH  PROPOFOL N/A 05/04/2021   Procedure: COLONOSCOPY WITH PROPOFOL;  Surgeon: Gatha Mayer, MD;  [facility];  Service: Endoscopy;  Laterality: N/A;   ESOPHAGOGASTRODUODENOSCOPY (EGD) WITH PROPOFOL N/A 05/04/2021   Procedure: ESOPHAGOGASTRODUODENOSCOPY (EGD) WITH PROPOFOL;  Surgeon: Gatha Mayer, MD;  [facility];  Service: Endoscopy;  Laterality: N/A;   OOPHORECTOMY     LSO -RSO   PELVIC LAPAROSCOPY  1989   [facility], LYSIS OF ADHESIONS   POLYPECTOMY  05/04/2021   Procedure: POLYPECTOMY;  Surgeon: Gatha Mayer, MD;  [facility];  Service: Endoscopy;;   TOTAL ABDOMINAL HYSTERECTOMY  1987   [facility], APPENDECTOMY   TOTAL HIP ARTHROPLASTY Right 10/28/2013   dr Lorin Mercy   TOTAL HIP ARTHROPLASTY Right 10/28/2013   Procedure: TOTAL HIP ARTHROPLASTY ANTERIOR APPROACH;  Surgeon: Marybelle Killings, MD;  Location: [place];  Service: Orthopedics;  Laterality: Right;  Right Total Hip Arthroplasty, Direct Anterior Approach   Past Medical History:  Diagnosis Date   Anemia    hx   Arthritis    Asthma    PFTs, February, 2011, moderate obstructive disease with response to bronchodilators, normal lung volumes, moderate reduction in diffusing capacity   Atrial septal aneurysm    Echo, 2008-not noted on 13 echo   CAD (coronary artery disease)    90% distal LAD in the past  /   nuclear, 2008, no ischemia, ejection fraction 70%   Cancer ([place]) 2002   DUCTAL CIS--S/P LUMPECTOMY, RADIATION AND 6 WEEKS OF TAMOXIFEN   D-dimer, elevated    January, 2014   Depression    Ejection fraction    EF 60%, echo, October, 2008   Elevated CPK    January, 2014   Endometriosis 1989   RIGHT TUBE   Endometriosis 1987   LEFT TUBE/OVARY W FOCAL IN-SITU ENDOMETRIAL ADENOCARCINOMA   GERD (gastroesophageal reflux disease)    occ   H/O hiatal hernia    ?   Hyperlipidemia    Hypertension    Hypothyroidism    Patient has had in the past that she does not need treatment   Kyphoscoliosis    Obstructive  airway disease ([place])    Pinched nerve    lower back   Shortness of breath    Echo 2/22: EF 60-65, no RWMA, mild LVH, GR 1  DD, GLS -24%, normal RVSF, mild MR, trivial AI, borderline dilation of aortic root (39 mm)   UTI (lower urinary tract infection)    There were no vitals taken for this visit.  Opioid Risk Score:   Fall Risk  Score:  `1  Depression screen PHQ 2/9  Depression screen [facility] 2/9 05/25/2021 03/11/2021 02/10/2021 11/27/2020 10/27/2020 10/08/2020 09/24/2020  Decreased Interest 0 3 3 1 1 2 2   Down, Depressed, Hopeless 1 3 3 1 1 2 2   PHQ - 2 Score 1 6 6 2 2 4 4   Altered sleeping - - - - - - -  Tired, decreased energy - - - - - - -  Change in appetite - - - - - - -  Feeling bad or failure about yourself  - - - - - - -  Trouble concentrating - - - - - - -  Moving slowly or fidgety/restless - - - - - - -  Suicidal thoughts - - - - - - -  PHQ-9 Score - - - - - - -  Difficult doing work/chores - - - - - - -  Some recent data might be hidden    Review of Systems  Musculoskeletal:  Positive for back pain and gait problem.       Pain both knees  All other systems reviewed and are negative.     Objective:   Physical Exam Vitals and nursing note reviewed.  Constitutional:      Appearance: Normal appearance.  Cardiovascular:     Rate and Rhythm: Normal rate and regular rhythm.     Pulses: Normal pulses.     Heart sounds: Normal heart sounds.  Pulmonary:     Effort: Pulmonary effort is normal.     Breath sounds: Normal breath sounds.  Musculoskeletal:     Cervical back: Normal range of motion and neck supple.     Comments: Normal Muscle Bulk and Muscle Testing Reveals:  Upper Extremities: Full ROM and Muscle Strength 5/5 Lumbar Paraspinal Tenderness: L-4-L-5 Lower Extremities: Full ROM and Muscle Strength 5/5 Bilateral Lower Extremities Flexion Produces Pin into her Bilateral Patella's Transfereed to wheelchair and escorted to her car via staff       Skin:    General:  Skin is warm and dry.  Neurological:     Mental Status: She is alert and oriented to person, place, and time.  Psychiatric:        Mood and Affect: Mood normal.        Behavior: Behavior normal.         Assessment & Plan:  1. Lumbar post laminectomy syndrome with severe kyphoscoliosis thoracolumbar spine, s/p lumbar fusion/  06/22/2021 Continue current medication regimen. Refilled: Hydrocodone 10/325mg  one tablet every 8 hours may take an extra tablet when pain is severe #100. We will continue the opioid monitoring program, this consists of regular clinic visits, examinations, urine drug screen, pill counts as well as use of [place] Controlled Substance Reporting system. A 12 month History has been reviewed on the [place] Controlled Substance Reporting System on 06/22/2021 2. Right Hip OA: S/P Right Hip Replacement 10/28/2013. 06/22/2021 3.Depression: Continue Cymbalta. Has Family Support and Friends.  Counseling with Doristine Bosworth. 06/22/2021. 4. Right Greater Trochanteric Tenderness: No complaints today. Continue to Monitor. Continue with Ice and Heat Therapy. 06/22/2021 5. Poor Appetite/ Frail> Ms. Cryder states PCP following. Continue to monitor.  6. Chronic Left Knee Pain: Continue HEP as Tolerated. Continue to Monitor.   F/U in 1 month

## 2021-06-24 DIAGNOSIS — J383 Other diseases of vocal cords: Secondary | ICD-10-CM | POA: Diagnosis not present

## 2021-06-24 DIAGNOSIS — J3802 Paralysis of vocal cords and larynx, bilateral: Secondary | ICD-10-CM | POA: Diagnosis not present

## 2021-06-24 DIAGNOSIS — J3801 Paralysis of vocal cords and larynx, unilateral: Secondary | ICD-10-CM | POA: Diagnosis not present

## 2021-06-24 DIAGNOSIS — K219 Gastro-esophageal reflux disease without esophagitis: Secondary | ICD-10-CM | POA: Diagnosis not present

## 2021-06-24 DIAGNOSIS — R490 Dysphonia: Secondary | ICD-10-CM | POA: Diagnosis not present

## 2021-06-28 ENCOUNTER — Other Ambulatory Visit: Payer: Self-pay | Admitting: Internal Medicine

## 2021-06-28 LAB — DRUG TOX MONITOR 1 W/CONF, ORAL FLD
Amphetamines: NEGATIVE ng/mL (ref <10)
Barbiturates: NEGATIVE ng/mL (ref <10)
Benzodiazepines: NEGATIVE ng/mL (ref <0.50)
Buprenorphine: NEGATIVE ng/mL (ref <0.10)
Cocaine: NEGATIVE ng/mL (ref <5.0)
Codeine: NEGATIVE ng/mL (ref <2.5)
Dihydrocodeine: 5.9 ng/mL — ABNORMAL HIGH (ref <2.5)
Fentanyl: NEGATIVE ng/mL (ref <0.10)
Heroin Metabolite: NEGATIVE ng/mL (ref <1.0)
Hydrocodone: 34.1 ng/mL — ABNORMAL HIGH (ref <2.5)
Hydromorphone: NEGATIVE ng/mL (ref <2.5)
MARIJUANA: NEGATIVE ng/mL (ref <2.5)
MDMA: NEGATIVE ng/mL (ref <10)
Meprobamate: NEGATIVE ng/mL (ref <2.5)
Methadone: NEGATIVE ng/mL (ref <5.0)
Morphine: NEGATIVE ng/mL (ref <2.5)
Nicotine Metabolite: NEGATIVE ng/mL (ref <5.0)
Norhydrocodone: NEGATIVE ng/mL (ref <2.5)
Noroxycodone: NEGATIVE ng/mL (ref <2.5)
Opiates: POSITIVE ng/mL — AB (ref <2.5)
Oxycodone: NEGATIVE ng/mL (ref <2.5)
Oxymorphone: NEGATIVE ng/mL (ref <2.5)
Phencyclidine: NEGATIVE ng/mL (ref <10)
Tapentadol: NEGATIVE ng/mL (ref <5.0)
Tramadol: NEGATIVE ng/mL (ref <5.0)
Zolpidem: NEGATIVE ng/mL (ref <5.0)

## 2021-06-28 LAB — DRUG TOX ALC METAB W/CON, ORAL FLD

## 2021-06-30 ENCOUNTER — Telehealth: Payer: Self-pay | Admitting: *Deleted

## 2021-06-30 NOTE — Telephone Encounter (Signed)
Oral swab drug screen was consistent for prescribed medications.  ?

## 2021-07-02 ENCOUNTER — Ambulatory Visit: Payer: Medicare Other | Admitting: Nurse Practitioner

## 2021-07-07 ENCOUNTER — Other Ambulatory Visit: Payer: Self-pay | Admitting: Internal Medicine

## 2021-07-07 DIAGNOSIS — H34832 Tributary (branch) retinal vein occlusion, left eye, with macular edema: Secondary | ICD-10-CM | POA: Diagnosis not present

## 2021-07-07 NOTE — Progress Notes (Signed)
? ? ?Subjective:  ? ? Patient ID: Hannah Scott, female    DOB: November 05, 1950, 71 y.o.   MRN: [id number] ? ?This visit occurred during the SARS-CoV-2 public health emergency.  Safety protocols were in place, including screening questions prior to the visit, additional usage of staff PPE, and extensive cleaning of exam room while observing appropriate contact time as indicated for disinfecting solutions. ? ? ? ?HPI ?Jannatul is here for  ?Chief Complaint  ?Patient presents with  ? Asthma  ? ? ? ?BP variable at home - 157/103 - 111/75.  She wonders if he needs back thiazide. ? ?Asthma flare  - it started 1 month ago - getting worse.  Increasing SOB and weakness with exertion - now able to do less.  She feels tightness in her chest that feels like her asthma.  She has some intermittent coughing episodes that occur mostly in the morning and she will bring up a little bit of phlegm.  She has some wheezing, but this is not as bad as usual.  She has some postnasal drip, but denies other cold symptoms.  Initially she thinks this may have started with a cold-she did have some discolored mucus and had more cold symptoms at that time. ? ? ? ? ?Medications and allergies reviewed with patient and updated if appropriate. ? ?Current Outpatient Medications on File Prior to Visit  ?Medication Sig Dispense Refill  ? albuterol (VENTOLIN HFA) 108 (90 Base) MCG/ACT inhaler Inhale 2 puffs into the lungs every 4 (four) hours as needed for wheezing or shortness of breath. 1 each 11  ? aspirin EC 81 MG tablet Take 1 tablet (81 mg total) by mouth daily. Swallow whole. 90 tablet 3  ? atorvastatin (LIPITOR) 20 MG tablet Take 1 tablet (20 mg total) by mouth daily. 90 tablet 3  ? bisoprolol (ZEBETA) 5 MG tablet Take 5 mg by mouth daily.    ? DULoxetine (CYMBALTA) 30 MG capsule TAKE ONE CAPSULE EVERY DAY IN ADDITION TO '60MG'$  CAPSULE FOR A TOTAL OF '90MG'$  DAILY 90 capsule 1  ? DULoxetine (CYMBALTA) 60 MG capsule TAKE ONE CAPSULE BY MOUTH DAILY. TAKE ALONG  WITH '30MG'$  CAPSULE. TOTAL DAILY DOSE = 90 MG. 30 capsule 1  ? ergocalciferol (VITAMIN D2) 1.25 MG (50000 UT) capsule Take 50,000 Units by mouth every Wednesday.    ? famotidine (PEPCID) 20 MG tablet One after supper (Patient taking differently: Take 20 mg by mouth every evening. One after supper) 30 tablet 11  ? fluticasone furoate-vilanterol (BREO ELLIPTA) 100-25 MCG/INH AEPB Inhale 1 puff into the lungs daily. 60 each 11  ? gabapentin (NEURONTIN) 600 MG tablet TAKE ONE TABLET EVERY DAY AT BEDTIME 30 tablet 5  ? HYDROcodone-acetaminophen (NORCO) 10-325 MG tablet Take 1 tablet by mouth every 8 (eight) hours as needed. Do Not Fill Before 07/05/2021 100 tablet 0  ? ipratropium-albuterol (DUONEB) 0.5-2.5 (3) MG/3ML SOLN Take 3 mLs by nebulization every 4 (four) hours as needed (wheezing or SOB). During exacerbation 360 mL 1  ? irbesartan (AVAPRO) 150 MG tablet Take 150 mg by mouth daily.    ? levothyroxine (SYNTHROID) 75 MCG tablet TAKE 1 TABLET(75 MCG) BY MOUTH DAILY 90 tablet 2  ? pantoprazole (PROTONIX) 40 MG tablet TAKE 1 TABLET(40 MG) BY MOUTH DAILY 30 TO 60 MINUTES BEFORE FIRST MEAL OF THE DAY 30 tablet 0  ? ?No current facility-administered medications on file prior to visit.  ? ? ?Review of Systems  ?Constitutional:  Positive for fatigue. Negative for  fever.  ?HENT:  Positive for postnasal drip. Negative for congestion, ear pain, sinus pain and sore throat.   ?Respiratory:  Positive for cough (intermittent, episodes), chest tightness, shortness of breath and wheezing.   ?Gastrointestinal:  Positive for constipation.  ?Neurological:  Positive for light-headedness. Negative for headaches.  ? ?   ?Objective:  ? ?Vitals:  ? 07/08/21 1135  ?BP: (!) 160/102  ?Pulse: 70  ?Temp: 97.9 ?F (36.6 ?C)  ?SpO2: 92%  ? ?BP Readings from Last 3 Encounters:  ?07/08/21 (!) 160/102  ?06/22/21 (!) 159/97  ?05/25/21 (!) 148/89  ? ?Wt Readings from Last 3 Encounters:  ?07/08/21 161 lb 3.2 oz (73.1 kg)  ?06/22/21 158 lb (71.7 kg)   ?05/25/21 158 lb (71.7 kg)  ? ?Body mass index is 31.48 kg/m?. ? ?  ?Physical Exam ?Constitutional:   ?   General: She is not in acute distress. ?   Appearance: Normal appearance.  ?HENT:  ?   Head: Normocephalic and atraumatic.  ?Eyes:  ?   Conjunctiva/sclera: Conjunctivae normal.  ?Cardiovascular:  ?   Rate and Rhythm: Normal rate and regular rhythm.  ?   Heart sounds: Normal heart sounds. No murmur heard. ?Pulmonary:  ?   Effort: Pulmonary effort is normal. No respiratory distress.  ?   Breath sounds: Normal breath sounds. No wheezing.  ?Musculoskeletal:  ?   Cervical back: Neck supple.  ?   Right lower leg: No edema.  ?   Left lower leg: No edema.  ?Lymphadenopathy:  ?   Cervical: No cervical adenopathy.  ?Skin: ?   Findings: No rash.  ?Neurological:  ?   Mental Status: She is alert. Mental status is at baseline.  ?Psychiatric:     ?   Mood and Affect: Mood normal.     ?   Behavior: Behavior normal.  ? ?   ? ? ? ? ? ?Assessment & Plan:  ? ? ?See Problem List for Assessment and Plan of chronic medical problems.  ? ? ? ? ?

## 2021-07-08 ENCOUNTER — Encounter: Payer: Self-pay | Admitting: Internal Medicine

## 2021-07-08 ENCOUNTER — Other Ambulatory Visit: Payer: Self-pay

## 2021-07-08 ENCOUNTER — Ambulatory Visit (INDEPENDENT_AMBULATORY_CARE_PROVIDER_SITE_OTHER): Payer: Medicare Other | Admitting: Internal Medicine

## 2021-07-08 VITALS — BP 160/102 | HR 70 | Temp 97.9°F | Ht 60.0 in | Wt 161.2 lb

## 2021-07-08 DIAGNOSIS — I10 Essential (primary) hypertension: Secondary | ICD-10-CM | POA: Diagnosis not present

## 2021-07-08 DIAGNOSIS — J4541 Moderate persistent asthma with (acute) exacerbation: Secondary | ICD-10-CM

## 2021-07-08 MED ORDER — HYDROCHLOROTHIAZIDE 12.5 MG PO TABS: 12.5000 mg | ORAL_TABLET | Freq: Every day | ORAL | 3 refills | Status: DC

## 2021-07-08 MED ORDER — METHYLPREDNISOLONE ACETATE 80 MG/ML IJ SUSP
80.0000 mg | Freq: Once | INTRAMUSCULAR | Status: AC
  Administered 2021-07-08: 80 mg via INTRAMUSCULAR

## 2021-07-08 MED ORDER — PREDNISONE 10 MG PO TABS: ORAL_TABLET | ORAL | 0 refills | Status: DC

## 2021-07-08 NOTE — Assessment & Plan Note (Signed)
Acute ?Symptoms are consistent with exacerbation of asthma with shortness of breath, fatigue, chest tightness, some cough and wheezing ?Discussed with her that I am also concerned some of the symptoms could potentially be cardiac in nature-she will call cardiology today to make an appointment ?Depo-Medrol 80 mg IM x1 ?Continue albuterol inhaler for 6 hours as needed, Breo inhaler 1 puff daily, DuoNeb treatments every 4 hours as needed ?Prescription for prednisone taper given-she will see how she feels after the steroid injection today and if she still needs steroid she will go ahead and start this, but there have been times where she has not needed the medication and not take it ?Follow-up next week ?

## 2021-07-08 NOTE — Addendum Note (Signed)
Addended by: Marcina Millard on: 07/08/2021 02:12 PM ? ? Modules accepted: Orders ? ?

## 2021-07-08 NOTE — Patient Instructions (Addendum)
? ? ?  A steroid injection was given today.   ? ? ?Medications changes include :   start hydrochlorothiazide 12.5 mg daily.  Prednisone taper.  Try miralax daily ? ?Monitor your BP at home. ? ? ?Your prescription(s) have been sent to your pharmacy.  ? ? ? ?Call cardiology and follow up with them asap.  ?

## 2021-07-08 NOTE — Assessment & Plan Note (Signed)
Neck ?Not ideally controlled-has been elevated at home ?Continue irbesartan 150 mg daily, bisoprolol 5 mg daily ?Restart HCTZ 12.5 mg daily ?Monitor BP at home ?Follow-up next week-we will do blood work at that time ?

## 2021-07-12 NOTE — Patient Instructions (Addendum)
? ? ? ?  Blood work was ordered.   ? ? ?Medications changes include :    ? ? ?Your prescription(s) have been sent to your pharmacy.  ? ? ?A referral was ordered for XX.     Someone from that office will call you to schedule an appointment.  ? ? ?Return in about 6 months (around 01/13/2022) for follow up. ? ?

## 2021-07-12 NOTE — Progress Notes (Signed)
? ? ? ? ?Subjective:  ? ? Patient ID: Hannah Scott, female    DOB: 1950-06-17, 71 y.o.   MRN: [id number] ? ?This visit occurred during the SARS-CoV-2 public health emergency.  Safety protocols were in place, including screening questions prior to the visit, additional usage of staff PPE, and extensive cleaning of exam room while observing appropriate contact time as indicated for disinfecting solutions.   ? ? ?HPI ?Hannah Scott is here for follow up of her chronic medical problems, including htn, prediabetes, hld, hypothyroid, vit d def, depression, asthma, chronic back pain ? ?She was here last week for an asthma exacerbation.  She had the steroid injection here.  She did not take the oral prednisone.  ? ? ? ?Medications and allergies reviewed with patient and updated if appropriate. ? ?Current Outpatient Medications on File Prior to Visit  ?Medication Sig Dispense Refill  ? albuterol (VENTOLIN HFA) 108 (90 Base) MCG/ACT inhaler Inhale 2 puffs into the lungs every 4 (four) hours as needed for wheezing or shortness of breath. 1 each 11  ? aspirin EC 81 MG tablet Take 1 tablet (81 mg total) by mouth daily. Swallow whole. 90 tablet 3  ? atorvastatin (LIPITOR) 20 MG tablet Take 1 tablet (20 mg total) by mouth daily. 90 tablet 3  ? bisoprolol (ZEBETA) 5 MG tablet Take 5 mg by mouth daily.    ? DULoxetine (CYMBALTA) 30 MG capsule TAKE ONE CAPSULE EVERY DAY IN ADDITION TO '60MG'$  CAPSULE FOR A TOTAL OF '90MG'$  DAILY 90 capsule 1  ? DULoxetine (CYMBALTA) 60 MG capsule TAKE ONE CAPSULE BY MOUTH DAILY. TAKE ALONG WITH '30MG'$  CAPSULE. TOTAL DAILY DOSE = 90 MG. 30 capsule 1  ? ergocalciferol (VITAMIN D2) 1.25 MG (50000 UT) capsule Take 50,000 Units by mouth every Wednesday.    ? famotidine (PEPCID) 20 MG tablet One after supper (Patient taking differently: Take 20 mg by mouth every evening. One after supper) 30 tablet 11  ? fluticasone furoate-vilanterol (BREO ELLIPTA) 100-25 MCG/INH AEPB Inhale 1 puff into the lungs daily. 60 each 11  ?  gabapentin (NEURONTIN) 600 MG tablet TAKE ONE TABLET EVERY DAY AT BEDTIME 30 tablet 5  ? hydrochlorothiazide (HYDRODIURIL) 12.5 MG tablet Take 1 tablet (12.5 mg total) by mouth daily. 90 tablet 3  ? HYDROcodone-acetaminophen (NORCO) 10-325 MG tablet Take 1 tablet by mouth every 8 (eight) hours as needed. Do Not Fill Before 07/05/2021 100 tablet 0  ? ipratropium-albuterol (DUONEB) 0.5-2.5 (3) MG/3ML SOLN Take 3 mLs by nebulization every 4 (four) hours as needed (wheezing or SOB). During exacerbation 360 mL 1  ? irbesartan (AVAPRO) 150 MG tablet Take 150 mg by mouth daily.    ? levothyroxine (SYNTHROID) 75 MCG tablet TAKE 1 TABLET(75 MCG) BY MOUTH DAILY 90 tablet 2  ? pantoprazole (PROTONIX) 40 MG tablet TAKE 1 TABLET(40 MG) BY MOUTH DAILY 30 TO 60 MINUTES BEFORE FIRST MEAL OF THE DAY 30 tablet 0  ? predniSONE (DELTASONE) 10 MG tablet Take 4 tabs po qd x 3 days, then 3 tabs po qd x 3 days, then 2 tabs po qd x 3 days, then 1 tab po qd x 3 days 30 tablet 0  ? ?No current facility-administered medications on file prior to visit.  ? ? ? ?Review of Systems ? ?   ?Objective:  ?There were no vitals filed for this visit. ?BP Readings from Last 3 Encounters:  ?07/08/21 (!) 160/102  ?06/22/21 (!) 159/97  ?05/25/21 (!) 148/89  ? ?Wt Readings from  Last 3 Encounters:  ?07/08/21 161 lb 3.2 oz (73.1 kg)  ?06/22/21 158 lb (71.7 kg)  ?05/25/21 158 lb (71.7 kg)  ? ?There is no height or weight on file to calculate BMI. ? ?  ?Physical Exam ?   ? ?Lab Results  ?Component Value Date  ? WBC 4.6 01/11/2021  ? HGB 12.9 01/11/2021  ? HCT 40.0 01/11/2021  ? PLT 206.0 01/11/2021  ? GLUCOSE 90 01/11/2021  ? CHOL 138 07/13/2020  ? TRIG 77.0 07/13/2020  ? HDL 50.80 07/13/2020  ? LDLDIRECT 154.8 02/20/2009  ? [place] 72 07/13/2020  ? ALT 18 01/11/2021  ? AST 27 01/11/2021  ? NA 140 01/11/2021  ? K 4.7 01/11/2021  ? CL 97 01/11/2021  ? CREATININE 0.85 01/11/2021  ? BUN 17 01/11/2021  ? CO2 37 (H) 01/11/2021  ? TSH 2.75 01/11/2021  ? INR 1.01  04/22/2015  ? HGBA1C 5.9 01/11/2021  ? ? ? ?Assessment & Plan:  ? ? ?See Problem List for Assessment and Plan of chronic medical problems.  ? ?This encounter was created in error - please disregard. ?

## 2021-07-13 ENCOUNTER — Ambulatory Visit: Payer: Medicare Other | Admitting: Registered Nurse

## 2021-07-13 ENCOUNTER — Encounter: Payer: Medicare Other | Admitting: Internal Medicine

## 2021-07-13 DIAGNOSIS — E7849 Other hyperlipidemia: Secondary | ICD-10-CM

## 2021-07-13 DIAGNOSIS — R7303 Prediabetes: Secondary | ICD-10-CM

## 2021-07-13 DIAGNOSIS — J4541 Moderate persistent asthma with (acute) exacerbation: Secondary | ICD-10-CM

## 2021-07-13 DIAGNOSIS — E039 Hypothyroidism, unspecified: Secondary | ICD-10-CM

## 2021-07-13 DIAGNOSIS — I10 Essential (primary) hypertension: Secondary | ICD-10-CM

## 2021-07-13 DIAGNOSIS — E559 Vitamin D deficiency, unspecified: Secondary | ICD-10-CM

## 2021-07-19 ENCOUNTER — Other Ambulatory Visit: Payer: Self-pay | Admitting: Internal Medicine

## 2021-07-19 ENCOUNTER — Ambulatory Visit: Payer: Medicare Other | Admitting: Internal Medicine

## 2021-07-19 ENCOUNTER — Other Ambulatory Visit (INDEPENDENT_AMBULATORY_CARE_PROVIDER_SITE_OTHER): Payer: Medicare Other

## 2021-07-19 DIAGNOSIS — E559 Vitamin D deficiency, unspecified: Secondary | ICD-10-CM

## 2021-07-19 DIAGNOSIS — I10 Essential (primary) hypertension: Secondary | ICD-10-CM | POA: Diagnosis not present

## 2021-07-19 DIAGNOSIS — E039 Hypothyroidism, unspecified: Secondary | ICD-10-CM

## 2021-07-19 DIAGNOSIS — R7303 Prediabetes: Secondary | ICD-10-CM

## 2021-07-19 DIAGNOSIS — E7849 Other hyperlipidemia: Secondary | ICD-10-CM

## 2021-07-19 LAB — CBC WITH DIFFERENTIAL/PLATELET
Basophils Absolute: 0 10*3/uL (ref 0.0–0.1)
Basophils Relative: 0.8 % (ref 0.0–3.0)
Eosinophils Absolute: 0.1 10*3/uL (ref 0.0–0.7)
Eosinophils Relative: 2.6 % (ref 0.0–5.0)
HCT: 39 % (ref 36.0–46.0)
Hemoglobin: 12.3 g/dL (ref 12.0–15.0)
Lymphocytes Relative: 11.5 % — ABNORMAL LOW (ref 12.0–46.0)
Lymphs Abs: 0.5 10*3/uL — ABNORMAL LOW (ref 0.7–4.0)
MCHC: 31.5 g/dL (ref 30.0–36.0)
MCV: 77.7 fl — ABNORMAL LOW (ref 78.0–100.0)
Monocytes Absolute: 0.4 10*3/uL (ref 0.1–1.0)
Monocytes Relative: 8.4 % (ref 3.0–12.0)
Neutro Abs: 3.5 10*3/uL (ref 1.4–7.7)
Neutrophils Relative %: 76.7 % (ref 43.0–77.0)
Platelets: 198 10*3/uL (ref 150.0–400.0)
RBC: 5.02 Mil/uL (ref 3.87–5.11)
RDW: 16.9 % — ABNORMAL HIGH (ref 11.5–15.5)
WBC: 4.5 10*3/uL (ref 4.0–10.5)

## 2021-07-19 LAB — LIPID PANEL
Cholesterol: 120 mg/dL (ref 0–200)
HDL: 53.9 mg/dL (ref >39.00)
LDL Cholesterol: 50 mg/dL (ref 0–99)
NonHDL: 65.76
Total CHOL/HDL Ratio: 2
Triglycerides: 81 mg/dL (ref 0.0–149.0)
VLDL: 16.2 mg/dL (ref 0.0–40.0)

## 2021-07-19 LAB — HEMOGLOBIN A1C: Hgb A1c MFr Bld: 6.7 % — ABNORMAL HIGH (ref 4.6–6.5)

## 2021-07-20 ENCOUNTER — Encounter: Payer: Medicare Other | Attending: Physical Medicine & Rehabilitation | Admitting: Registered Nurse

## 2021-07-20 ENCOUNTER — Encounter: Payer: Self-pay | Admitting: Registered Nurse

## 2021-07-20 ENCOUNTER — Encounter: Payer: Self-pay | Admitting: Internal Medicine

## 2021-07-20 ENCOUNTER — Other Ambulatory Visit: Payer: Self-pay

## 2021-07-20 VITALS — BP 142/87 | HR 75 | Ht 60.0 in | Wt 158.0 lb

## 2021-07-20 DIAGNOSIS — M48062 Spinal stenosis, lumbar region with neurogenic claudication: Secondary | ICD-10-CM | POA: Insufficient documentation

## 2021-07-20 DIAGNOSIS — M24551 Contracture, right hip: Secondary | ICD-10-CM | POA: Diagnosis not present

## 2021-07-20 DIAGNOSIS — G8929 Other chronic pain: Secondary | ICD-10-CM | POA: Insufficient documentation

## 2021-07-20 DIAGNOSIS — Z79891 Long term (current) use of opiate analgesic: Secondary | ICD-10-CM | POA: Insufficient documentation

## 2021-07-20 DIAGNOSIS — Z5181 Encounter for therapeutic drug level monitoring: Secondary | ICD-10-CM

## 2021-07-20 DIAGNOSIS — G894 Chronic pain syndrome: Secondary | ICD-10-CM | POA: Insufficient documentation

## 2021-07-20 DIAGNOSIS — M961 Postlaminectomy syndrome, not elsewhere classified: Secondary | ICD-10-CM | POA: Insufficient documentation

## 2021-07-20 DIAGNOSIS — M25562 Pain in left knee: Secondary | ICD-10-CM | POA: Insufficient documentation

## 2021-07-20 DIAGNOSIS — M25561 Pain in right knee: Secondary | ICD-10-CM | POA: Insufficient documentation

## 2021-07-20 LAB — TSH: TSH: 34.97 u[IU]/mL — ABNORMAL HIGH (ref 0.35–5.50)

## 2021-07-20 LAB — VITAMIN D 25 HYDROXY (VIT D DEFICIENCY, FRACTURES): VITD: 60.61 ng/mL (ref 30.00–100.00)

## 2021-07-20 MED ORDER — LEVOTHYROXINE SODIUM 88 MCG PO TABS: 88.0000 ug | ORAL_TABLET | Freq: Every day | ORAL | 3 refills | Status: DC

## 2021-07-20 NOTE — Progress Notes (Signed)
? ?Subjective:  ? ? Patient ID: Hannah Scott, female    DOB: 1950-06-26, 71 y.o.   MRN: [id number] ? ?HPI: Hannah Scott is a 71 y.o. female who is scheduled for telephone visit today, for chronic pain and medication refill.  Hannah Scott and I have  discussed the limitations of evaluation and management by telemedicine and the availability of in person appointments. Hannah Scott expressed understanding and agreed to proceed. She states her pain is located in her lower back and bilateral knees L>R. She rates her pain 7. Her current exercise regime is walking and performing stretching exercises. ? ?Hannah Scott Morphine equivalent is 23.33 MME.   Last Oral Swab was Performed on 06/22/2021, it was consistent.  ?  ? ?Pain Inventory ?Average Pain 7 ?Pain Right Now 7 ?My pain is intermittent, constant, burning, tingling, and aching ? ?In the last 24 hours, has pain interfered with the following? ?General activity 10 ?Relation with others 10 ?Enjoyment of life 10 ?What TIME of day is your pain at its worst? morning , daytime, evening, night, and varies ?Sleep (in general) Poor ? ?Pain is worse with: walking, bending, standing, and some activites ?Pain improves with: rest and medication ?Relief from Meds: 5 ? ?Family History  ?Problem Relation Age of Onset  ? Heart failure Mother   ? Pneumonia Mother   ? Hypertension Mother   ? Heart disease Mother   ? Stroke Maternal Grandfather   ? Hypertension Maternal Grandfather   ? Heart attack Father   ? Heart disease Father   ? Asthma Paternal Uncle   ?     PAT UNCLES  ? Heart attack Paternal Uncle   ? ?Social History  ? ?Socioeconomic History  ? Marital status: Widowed  ?  Spouse name: Not on file  ? Number of children: Not on file  ? Years of education: Not on file  ? Highest education level: Not on file  ?Occupational History  ? Not on file  ?Tobacco Use  ? Smoking status: Never  ? Smokeless tobacco: Never  ?Vaping Use  ? Vaping Use: Never used  ?Substance and Sexual Activity  ? Alcohol use: No  ?   Alcohol/week: 0.0 standard drinks  ? Drug use: No  ? Sexual activity: Not Currently  ?  Birth control/protection: Surgical  ?Other Topics Concern  ? Not on file  ?Social History Narrative  ? Widowed in 2015  ? 2 sons 1 lives in the area the other is in close contact   ? 1 grandchild  ? Never smoker no drug use no alcohol  ? ?Social Determinants of Health  ? ?Financial Resource Strain: Low Risk   ? Difficulty of Paying Living Expenses: Not hard at all  ?Food Insecurity: No Food Insecurity  ? Worried About Charity fundraiser in the Last Year: Never true  ? Ran Out of Food in the Last Year: Never true  ?Transportation Needs: No Transportation Needs  ? Lack of Transportation (Medical): No  ? Lack of Transportation (Non-Medical): No  ?Physical Activity: Inactive  ? Days of Exercise per Week: 0 days  ? Minutes of Exercise per Session: 0 min  ?Stress: No Stress Concern Present  ? Feeling of Stress : Not at all  ?Social Connections: Moderately Integrated  ? Frequency of Communication with Friends and Family: More than three times a week  ? Frequency of Social Gatherings with Friends and Family: More than three times a week  ? Attends Religious Services:  More than 4 times per year  ? Active Member of Clubs or Organizations: No  ? Attends Archivist Meetings: More than 4 times per year  ? Marital Status: Widowed  ? ?Past Surgical History:  ?Procedure Laterality Date  ? ANKLE SURGERY Left   ? ligament  ? APPENDECTOMY  1987  ? AT [facility]  ? BACK SURGERY    ? Fusion  ? BREAST LUMPECTOMY  2003  ? radiation on right  ? BREAST LUMPECTOMY WITH RADIOACTIVE SEED LOCALIZATION Left 02/12/2016  ? Procedure: LEFT BREAST LUMPECTOMY WITH RADIOACTIVE SEED LOCALIZATION;  Surgeon: Excell Seltzer, MD;  Location: [place] [facility];  Service: General;  Laterality: Left;  ? CARDIOVASCULAR STRESS TEST  09/07/05  ? Nuclear, was negative  ? CATARACT EXTRACTION Bilateral 01,03  ? COLONOSCOPY WITH PROPOFOL N/A 05/04/2021  ? Procedure:  COLONOSCOPY WITH PROPOFOL;  Surgeon: Gatha Mayer, MD;  Location: [facility];  Service: Endoscopy;  Laterality: N/A;  ? ESOPHAGOGASTRODUODENOSCOPY (EGD) WITH PROPOFOL N/A 05/04/2021  ? Procedure: ESOPHAGOGASTRODUODENOSCOPY (EGD) WITH PROPOFOL;  Surgeon: Gatha Mayer, MD;  Location: [facility];  Service: Endoscopy;  Laterality: N/A;  ? OOPHORECTOMY    ? LSO -RSO  ? PELVIC LAPAROSCOPY  1989  ? RSO, LYSIS OF ADHESIONS  ? POLYPECTOMY  05/04/2021  ? Procedure: POLYPECTOMY;  Surgeon: Gatha Mayer, MD;  Location: [facility];  Service: Endoscopy;;  ? TOTAL ABDOMINAL HYSTERECTOMY  1987  ? LSO, APPENDECTOMY  ? TOTAL HIP ARTHROPLASTY Right 10/28/2013  ? dr Lorin Mercy  ? TOTAL HIP ARTHROPLASTY Right 10/28/2013  ? Procedure: TOTAL HIP ARTHROPLASTY ANTERIOR APPROACH;  Surgeon: Marybelle Killings, MD;  Location: [place];  Service: Orthopedics;  Laterality: Right;  Right Total Hip Arthroplasty, Direct Anterior Approach  ? ?Past Surgical History:  ?Procedure Laterality Date  ? ANKLE SURGERY Left   ? ligament  ? APPENDECTOMY  1987  ? AT [facility]  ? BACK SURGERY    ? Fusion  ? BREAST LUMPECTOMY  2003  ? radiation on right  ? BREAST LUMPECTOMY WITH RADIOACTIVE SEED LOCALIZATION Left 02/12/2016  ? Procedure: LEFT BREAST LUMPECTOMY WITH RADIOACTIVE SEED LOCALIZATION;  Surgeon: Excell Seltzer, MD;  Location: [facility];  Service: General;  Laterality: Left;  ? CARDIOVASCULAR STRESS TEST  09/07/05  ? Nuclear, was negative  ? CATARACT EXTRACTION Bilateral 01,03  ? COLONOSCOPY WITH PROPOFOL N/A 05/04/2021  ? Procedure: COLONOSCOPY WITH PROPOFOL;  Surgeon: Gatha Mayer, MD;  Location: [facility];  Service: Endoscopy;  Laterality: N/A;  ? ESOPHAGOGASTRODUODENOSCOPY (EGD) WITH PROPOFOL N/A 05/04/2021  ? Procedure: ESOPHAGOGASTRODUODENOSCOPY (EGD) WITH PROPOFOL;  Surgeon: Gatha Mayer, MD;  Location: [facility];  Service: Endoscopy;  Laterality: N/A;  ? OOPHORECTOMY    ? LSO -RSO  ? PELVIC LAPAROSCOPY  1989  ? RSO, LYSIS OF  ADHESIONS  ? POLYPECTOMY  05/04/2021  ? Procedure: POLYPECTOMY;  Surgeon: Gatha Mayer, MD;  Location: [facility];  Service: Endoscopy;;  ? TOTAL ABDOMINAL HYSTERECTOMY  1987  ? LSO, APPENDECTOMY  ? TOTAL HIP ARTHROPLASTY Right 10/28/2013  ? dr Lorin Mercy  ? TOTAL HIP ARTHROPLASTY Right 10/28/2013  ? Procedure: TOTAL HIP ARTHROPLASTY ANTERIOR APPROACH;  Surgeon: Marybelle Killings, MD;  Location: [place];  Service: Orthopedics;  Laterality: Right;  Right Total Hip Arthroplasty, Direct Anterior Approach  ? ?Past Medical History:  ?Diagnosis Date  ? Anemia   ? hx  ? Arthritis   ? Asthma   ? PFTs, February, 2011, moderate obstructive disease with response  to bronchodilators, normal lung volumes, moderate reduction in diffusing capacity  ? Atrial septal aneurysm   ? Echo, 2008-not noted on 13 echo  ? CAD (coronary artery disease)   ? 90% distal LAD in the past  /   nuclear, 2008, no ischemia, ejection fraction 70%  ? Cancer [facility]) 2002  ? DUCTAL CIS--S/P LUMPECTOMY, RADIATION AND 6 WEEKS OF TAMOXIFEN  ? D-dimer, elevated   ? January, 2014  ? Depression   ? Ejection fraction   ? EF 60%, echo, October, 2008  ? Elevated CPK   ? January, 2014  ? Endometriosis 1989  ? RIGHT TUBE  ? Endometriosis 1987  ? LEFT TUBE/OVARY W FOCAL IN-SITU ENDOMETRIAL ADENOCARCINOMA  ? GERD (gastroesophageal reflux disease)   ? occ  ? H/O hiatal hernia   ? ?  ? Hyperlipidemia   ? Hypertension   ? Hypothyroidism   ? Patient has had in the past that she does not need treatment  ? Kyphoscoliosis   ? Obstructive airway disease ([place])   ? Pinched nerve   ? lower back  ? Shortness of breath   ? Echo 2/22: EF 60-65, no RWMA, mild LVH, GR 1  DD, GLS -24%, normal RVSF, mild MR, trivial AI, borderline dilation of aortic root (39 mm)  ? UTI (lower urinary tract infection)   ? ?BP (!) 142/87   Pulse 75   Ht 5' (1.524 m)   Wt 158 lb (71.7 kg)   BMI 30.86 kg/m?  ? ?Opioid Risk Score:   ?Fall Risk Score:  `1 ? ?Depression screen PHQ 2/9 ? ? ?  06/22/2021  ?  2:48 PM  05/25/2021  ?  2:27 PM 03/11/2021  ? 12:48 PM 02/10/2021  ?  9:20 AM 11/27/2020  ?  1:10 PM 10/27/2020  ?  1:38 PM 10/08/2020  ?  2:39 PM  ?Depression screen PHQ 2/9  ?Decreased Interest 1 0 '3 3 1 1 2  '$ ?Down, Depressed

## 2021-07-25 ENCOUNTER — Encounter: Payer: Self-pay | Admitting: Internal Medicine

## 2021-07-25 NOTE — Progress Notes (Signed)
? ? ? ? ?Subjective:  ? ? Patient ID: Hannah Scott, female    DOB: [birth year], 71 y.o.   MRN: [id number] ? ?This visit occurred during the SARS-CoV-2 public health emergency.  Safety protocols were in place, including screening questions prior to the visit, additional usage of staff PPE, and extensive cleaning of exam room while observing appropriate contact time as indicated for disinfecting solutions.   ? ? ?HPI ?Hannah Scott is here for follow up of her chronic medical problems, including htn, DM, hld, hypothyroid, vit d def, depression, asthma, chronic back pain  ? ?She was here two weeks ago for an asthma exacerbation.  She is doing better after the injection and did not need the oral steroids.   ? ?She is still very tired.  Thinks it is her thyroid -she has been taking her medication daily. ? ? ?Medications and allergies reviewed with patient and updated if appropriate. ? ?Current Outpatient Medications on File Prior to Visit  ?Medication Sig Dispense Refill  ? albuterol (VENTOLIN HFA) 108 (90 Base) MCG/ACT inhaler Inhale 2 puffs into the lungs every 4 (four) hours as needed for wheezing or shortness of breath. 1 each 11  ? atorvastatin (LIPITOR) 20 MG tablet Take 1 tablet (20 mg total) by mouth daily. 90 tablet 3  ? bisoprolol (ZEBETA) 5 MG tablet Take 5 mg by mouth daily.    ? DULoxetine (CYMBALTA) 30 MG capsule TAKE ONE CAPSULE EVERY DAY IN ADDITION TO '60MG'$  CAPSULE FOR A TOTAL OF '90MG'$  DAILY 90 capsule 1  ? DULoxetine (CYMBALTA) 60 MG capsule TAKE ONE CAPSULE BY MOUTH DAILY. TAKE ALONG WITH '30MG'$  CAPSULE. TOTAL DAILY DOSE = 90 MG. 30 capsule 1  ? famotidine (PEPCID) 20 MG tablet One after supper (Patient taking differently: Take 20 mg by mouth every evening. One after supper) 30 tablet 11  ? fluticasone furoate-vilanterol (BREO ELLIPTA) 100-25 MCG/INH AEPB Inhale 1 puff into the lungs daily. 60 each 11  ? gabapentin (NEURONTIN) 600 MG tablet TAKE ONE TABLET EVERY DAY AT BEDTIME 30 tablet 5  ? hydrochlorothiazide  (HYDRODIURIL) 12.5 MG tablet Take 1 tablet (12.5 mg total) by mouth daily. 90 tablet 3  ? HYDROcodone-acetaminophen (NORCO) 10-325 MG tablet Take 1 tablet by mouth every 8 (eight) hours as needed. Do Not Fill Before 07/05/2021 100 tablet 0  ? ipratropium-albuterol (DUONEB) 0.5-2.5 (3) MG/3ML SOLN Take 3 mLs by nebulization every 4 (four) hours as needed (wheezing or SOB). During exacerbation 360 mL 1  ? irbesartan (AVAPRO) 150 MG tablet Take 150 mg by mouth daily.    ? pantoprazole (PROTONIX) 40 MG tablet TAKE 1 TABLET(40 MG) BY MOUTH DAILY 30 TO 60 MINUTES BEFORE FIRST MEAL OF THE DAY 30 tablet 0  ? Vitamin D, Ergocalciferol, (DRISDOL) 1.25 MG (50000 UNIT) CAPS capsule TAKE ONE CAPSULE BY MOUTH ONCE A WEEK 12 capsule 0  ? aspirin EC 81 MG tablet Take 1 tablet (81 mg total) by mouth daily. Swallow whole. (Patient not taking: Reported on 07/26/2021) 90 tablet 3  ? ?No current facility-administered medications on file prior to visit.  ? ? ? ?Review of Systems  ?Constitutional:  Negative for chills and fever.  ?Respiratory:  Positive for shortness of breath (at baseline). Negative for cough and wheezing.   ?Cardiovascular:  Positive for leg swelling (mild, occ). Negative for chest pain and palpitations.  ?Neurological:  Positive for light-headedness (occ) and headaches (occ).  ? ?   ?Objective:  ? ?Vitals:  ? 07/26/21 1453 07/26/21 1533  ?  BP: (!) 150/110 140/80  ?Pulse: 94   ?Temp: 98.3 ?F (36.8 ?C)   ?SpO2: 96%   ? ?BP Readings from Last 3 Encounters:  ?07/26/21 140/80  ?07/20/21 (!) 142/87  ?07/08/21 (!) 160/102  ? ?Wt Readings from Last 3 Encounters:  ?07/26/21 154 lb (69.9 kg)  ?07/20/21 158 lb (71.7 kg)  ?07/08/21 161 lb 3.2 oz (73.1 kg)  ? ?Body mass index is 30.08 kg/m?. ? ?  ?Physical Exam ?Constitutional:   ?   General: She is not in acute distress. ?   Appearance: Normal appearance.  ?HENT:  ?   Head: Normocephalic and atraumatic.  ?Eyes:  ?   Conjunctiva/sclera: Conjunctivae normal.  ?Cardiovascular:  ?   Rate  and Rhythm: Normal rate and regular rhythm.  ?   Heart sounds: Normal heart sounds. No murmur heard. ?Pulmonary:  ?   Effort: Pulmonary effort is normal. No respiratory distress.  ?   Breath sounds: Normal breath sounds. No wheezing.  ?Musculoskeletal:  ?   Cervical back: Neck supple.  ?   Right lower leg: No edema.  ?   Left lower leg: No edema.  ?Lymphadenopathy:  ?   Cervical: No cervical adenopathy.  ?Skin: ?   Findings: No rash.  ?Neurological:  ?   Mental Status: She is alert. Mental status is at baseline.  ?Psychiatric:     ?   Mood and Affect: Mood normal.     ?   Behavior: Behavior normal.  ? ?   ? ?Lab Results  ?Component Value Date  ? WBC 4.5 07/19/2021  ? HGB 12.3 07/19/2021  ? HCT 39.0 07/19/2021  ? PLT 198.0 07/19/2021  ? GLUCOSE 90 01/11/2021  ? CHOL 120 07/19/2021  ? TRIG 81.0 07/19/2021  ? HDL 53.90 07/19/2021  ? LDLDIRECT 154.8 02/20/2009  ? [place] 50 07/19/2021  ? ALT 18 01/11/2021  ? AST 27 01/11/2021  ? NA 140 01/11/2021  ? K 4.7 01/11/2021  ? CL 97 01/11/2021  ? CREATININE 0.85 01/11/2021  ? BUN 17 01/11/2021  ? CO2 37 (H) 01/11/2021  ? TSH 34.97 (H) 07/19/2021  ? INR 1.01 04/22/2015  ? HGBA1C 6.7 (H) 07/19/2021  ? ? ? ?Assessment & Plan:  ? ? ?See Problem List for Assessment and Plan of chronic medical problems.  ? ? ?

## 2021-07-25 NOTE — Patient Instructions (Addendum)
? ? ? ?  Blood work was ordered.  Have this done in about 6 weeks.   ? ? ?Medications changes include :   increase thyroid dose to 100 mcg daily ? ? ?Your prescription(s) have been sent to your pharmacy.  ? ? ? ? ? ?Return in about 6 months (around 01/25/2022) for follow up, sooner if needed. ? ?

## 2021-07-26 ENCOUNTER — Ambulatory Visit (INDEPENDENT_AMBULATORY_CARE_PROVIDER_SITE_OTHER): Payer: Medicare Other | Admitting: Internal Medicine

## 2021-07-26 VITALS — BP 140/80 | HR 94 | Temp 98.3°F | Ht 60.0 in | Wt 154.0 lb

## 2021-07-26 DIAGNOSIS — I10 Essential (primary) hypertension: Secondary | ICD-10-CM

## 2021-07-26 DIAGNOSIS — E7849 Other hyperlipidemia: Secondary | ICD-10-CM

## 2021-07-26 DIAGNOSIS — E039 Hypothyroidism, unspecified: Secondary | ICD-10-CM | POA: Diagnosis not present

## 2021-07-26 DIAGNOSIS — J4530 Mild persistent asthma, uncomplicated: Secondary | ICD-10-CM

## 2021-07-26 DIAGNOSIS — E119 Type 2 diabetes mellitus without complications: Secondary | ICD-10-CM

## 2021-07-26 DIAGNOSIS — M48062 Spinal stenosis, lumbar region with neurogenic claudication: Secondary | ICD-10-CM

## 2021-07-26 DIAGNOSIS — H34832 Tributary (branch) retinal vein occlusion, left eye, with macular edema: Secondary | ICD-10-CM | POA: Diagnosis not present

## 2021-07-26 DIAGNOSIS — H3582 Retinal ischemia: Secondary | ICD-10-CM | POA: Diagnosis not present

## 2021-07-26 DIAGNOSIS — H3562 Retinal hemorrhage, left eye: Secondary | ICD-10-CM | POA: Diagnosis not present

## 2021-07-26 DIAGNOSIS — E559 Vitamin D deficiency, unspecified: Secondary | ICD-10-CM

## 2021-07-26 DIAGNOSIS — F3289 Other specified depressive episodes: Secondary | ICD-10-CM

## 2021-07-26 DIAGNOSIS — J4541 Moderate persistent asthma with (acute) exacerbation: Secondary | ICD-10-CM

## 2021-07-26 DIAGNOSIS — H35033 Hypertensive retinopathy, bilateral: Secondary | ICD-10-CM | POA: Diagnosis not present

## 2021-07-26 MED ORDER — LEVOTHYROXINE SODIUM 100 MCG PO TABS: 100.0000 ug | ORAL_TABLET | Freq: Every day | ORAL | 3 refills | Status: DC

## 2021-07-26 NOTE — Assessment & Plan Note (Signed)
Chronic ?Lab Results  ?Component Value Date  ? HGBA1C 6.7 (H) 07/19/2021  ? ?Sugars still controlled, but sugars are higher than they were 6 months ago ?Decrease sugar intake ?We will recheck in 6 months ?

## 2021-07-26 NOTE — Assessment & Plan Note (Signed)
Chronic Following with pain management 

## 2021-07-26 NOTE — Assessment & Plan Note (Signed)
Chronic ?Recent vitamin D level much better ?Continue current dose of vitamin D daily ?

## 2021-07-26 NOTE — Assessment & Plan Note (Addendum)
Chronic ?BP not controlled initially, but better on repeat ?Just started hctz 12.5 mg daily, so I will not make any new changes ?Continue HCTZ 12.5 mg daily, bisoprolol 5 mg daily, irbesartan 150 mg daily ?cmp ? ?

## 2021-07-26 NOTE — Assessment & Plan Note (Signed)
Chronic ?Controlled, Stable ?Continue duloxetine 90 mg daily ?

## 2021-07-26 NOTE — Assessment & Plan Note (Signed)
Chronic ?Recent TSH very elevated ?She has been taking her medication on a regular basis ?She is very fatigued and symptomatic ?Increase levothyroxine to 100 mcg daily ?Recheck TSH in about 6 weeks ?

## 2021-07-26 NOTE — Assessment & Plan Note (Signed)
Chronic Continue atorvastatin 20 mg daily 

## 2021-07-26 NOTE — Assessment & Plan Note (Signed)
Subacute ?Much improved after steroid injection ?Lungs currently clear and she feels much better ?Continue maintenance inhalers ?

## 2021-07-29 DIAGNOSIS — E041 Nontoxic single thyroid nodule: Secondary | ICD-10-CM | POA: Diagnosis not present

## 2021-07-29 DIAGNOSIS — J383 Other diseases of vocal cords: Secondary | ICD-10-CM | POA: Diagnosis not present

## 2021-07-29 DIAGNOSIS — J45909 Unspecified asthma, uncomplicated: Secondary | ICD-10-CM | POA: Diagnosis not present

## 2021-07-29 DIAGNOSIS — R490 Dysphonia: Secondary | ICD-10-CM | POA: Diagnosis not present

## 2021-07-29 DIAGNOSIS — R040 Epistaxis: Secondary | ICD-10-CM | POA: Diagnosis not present

## 2021-07-29 DIAGNOSIS — M419 Scoliosis, unspecified: Secondary | ICD-10-CM | POA: Diagnosis not present

## 2021-07-29 DIAGNOSIS — K219 Gastro-esophageal reflux disease without esophagitis: Secondary | ICD-10-CM | POA: Diagnosis not present

## 2021-07-29 DIAGNOSIS — J3801 Paralysis of vocal cords and larynx, unilateral: Secondary | ICD-10-CM | POA: Diagnosis not present

## 2021-07-30 ENCOUNTER — Other Ambulatory Visit: Payer: Self-pay | Admitting: Internal Medicine

## 2021-08-13 ENCOUNTER — Encounter: Payer: Self-pay | Admitting: Registered Nurse

## 2021-08-13 ENCOUNTER — Encounter: Payer: Medicare Other | Attending: Physical Medicine & Rehabilitation | Admitting: Registered Nurse

## 2021-08-13 ENCOUNTER — Encounter: Payer: Medicare Other | Admitting: Registered Nurse

## 2021-08-13 VITALS — BP 129/84 | HR 88 | Ht 60.0 in | Wt 154.0 lb

## 2021-08-13 DIAGNOSIS — M48062 Spinal stenosis, lumbar region with neurogenic claudication: Secondary | ICD-10-CM | POA: Insufficient documentation

## 2021-08-13 DIAGNOSIS — G894 Chronic pain syndrome: Secondary | ICD-10-CM | POA: Diagnosis not present

## 2021-08-13 DIAGNOSIS — Z79891 Long term (current) use of opiate analgesic: Secondary | ICD-10-CM | POA: Insufficient documentation

## 2021-08-13 DIAGNOSIS — M961 Postlaminectomy syndrome, not elsewhere classified: Secondary | ICD-10-CM | POA: Insufficient documentation

## 2021-08-13 DIAGNOSIS — M24551 Contracture, right hip: Secondary | ICD-10-CM | POA: Diagnosis not present

## 2021-08-13 DIAGNOSIS — Z5181 Encounter for therapeutic drug level monitoring: Secondary | ICD-10-CM | POA: Insufficient documentation

## 2021-08-13 MED ORDER — HYDROCODONE-ACETAMINOPHEN 10-325 MG PO TABS: 1.0000 | ORAL_TABLET | Freq: Three times a day (TID) | ORAL | 0 refills | Status: DC | PRN

## 2021-08-13 NOTE — Progress Notes (Signed)
? ?Subjective:  ? ? Patient ID: Hannah Scott, female    DOB: 02-Dec-1950, 71 y.o.   MRN: [id number] ? ?HPI: Hannah Scott is a 71 y.o. female who is scheduled for telephone visit, Hannah Scott and I have  discussed the limitations of evaluation and management by telemedicine and the availability of in person appointments. The patient expressed understanding and agreed to proceed ?She  states her pain is located in her lower back. She rates her pain 7. Her current exercise regime is walking with her walker. ? ?Hannah Scott Morphine equivalent is 33.33 MME.   Last Oral Swab was Performed on 06/22/2021, it was consistent.  ?  ? ?Pain Inventory ?Average Pain 7 ?Pain Right Now 7 ?My pain is intermittent, burning, and aching ? ?In the last 24 hours, has pain interfered with the following? ?General activity 10 ?Relation with others 10 ?Enjoyment of life 10 ?What TIME of day is your pain at its worst? morning , daytime, evening, and night ?Sleep (in general) Poor ? ?Pain is worse with: walking and standing ?Pain improves with: medication ?Relief from Meds: 6 ? ?Family History  ?Problem Relation Age of Onset  ? Heart failure Mother   ? Pneumonia Mother   ? Hypertension Mother   ? Heart disease Mother   ? Stroke Maternal Grandfather   ? Hypertension Maternal Grandfather   ? Heart attack Father   ? Heart disease Father   ? Asthma Paternal Uncle   ?     PAT UNCLES  ? Heart attack Paternal Uncle   ? ?Social History  ? ?Socioeconomic History  ? Marital status: Widowed  ?  Spouse name: Not on file  ? Number of children: Not on file  ? Years of education: Not on file  ? Highest education level: Not on file  ?Occupational History  ? Not on file  ?Tobacco Use  ? Smoking status: Never  ? Smokeless tobacco: Never  ?Vaping Use  ? Vaping Use: Never used  ?Substance and Sexual Activity  ? Alcohol use: No  ?  Alcohol/week: 0.0 standard drinks  ? Drug use: No  ? Sexual activity: Not Currently  ?  Birth control/protection: Surgical  ?Other Topics Concern   ? Not on file  ?Social History Narrative  ? Widowed in 2015  ? 2 sons 1 lives in the area the other is in close contact   ? 1 grandchild  ? Never smoker no drug use no alcohol  ? ?Social Determinants of Health  ? ?Financial Resource Strain: Low Risk   ? Difficulty of Paying Living Expenses: Not hard at all  ?Food Insecurity: No Food Insecurity  ? Worried About Charity fundraiser in the Last Year: Never true  ? Ran Out of Food in the Last Year: Never true  ?Transportation Needs: No Transportation Needs  ? Lack of Transportation (Medical): No  ? Lack of Transportation (Non-Medical): No  ?Physical Activity: Inactive  ? Days of Exercise per Week: 0 days  ? Minutes of Exercise per Session: 0 min  ?Stress: No Stress Concern Present  ? Feeling of Stress : Not at all  ?Social Connections: Moderately Integrated  ? Frequency of Communication with Friends and Family: More than three times a week  ? Frequency of Social Gatherings with Friends and Family: More than three times a week  ? Attends Religious Services: More than 4 times per year  ? Active Member of Clubs or Organizations: No  ? Attends Club or  Organization Meetings: More than 4 times per year  ? Marital Status: Widowed  ? ?Past Surgical History:  ?Procedure Laterality Date  ? ANKLE SURGERY Left   ? ligament  ? APPENDECTOMY  1987  ? AT [facility]  ? BACK SURGERY    ? Fusion  ? BREAST LUMPECTOMY  2003  ? radiation on right  ? BREAST LUMPECTOMY WITH RADIOACTIVE SEED LOCALIZATION Left 02/12/2016  ? Procedure: LEFT BREAST LUMPECTOMY WITH RADIOACTIVE SEED LOCALIZATION;  Surgeon: Excell Seltzer, MD;  Location: [place];  Service: General;  Laterality: Left;  ? CARDIOVASCULAR STRESS TEST  09/07/05  ? Nuclear, was negative  ? CATARACT EXTRACTION Bilateral 01,03  ? COLONOSCOPY WITH PROPOFOL N/A 05/04/2021  ? Procedure: COLONOSCOPY WITH PROPOFOL;  Surgeon: Gatha Mayer, MD;  [facility];  Service: Endoscopy;  Laterality: N/A;  ?  ESOPHAGOGASTRODUODENOSCOPY (EGD) WITH PROPOFOL N/A 05/04/2021  ? Procedure: ESOPHAGOGASTRODUODENOSCOPY (EGD) WITH PROPOFOL;  Surgeon: Gatha Mayer, MD;  [facility];  Service: Endoscopy;  Laterality: N/A;  ? OOPHORECTOMY    ? LSO -RSO  ? PELVIC LAPAROSCOPY  1989  ? RSO, LYSIS OF ADHESIONS  ? POLYPECTOMY  05/04/2021  ? Procedure: POLYPECTOMY;  Surgeon: Gatha Mayer, MD;  Location: [facility];  Service: Endoscopy;;  ? TOTAL ABDOMINAL HYSTERECTOMY  1987  ? LSO, APPENDECTOMY  ? TOTAL HIP ARTHROPLASTY Right 10/28/2013  ? dr Lorin Mercy  ? TOTAL HIP ARTHROPLASTY Right 10/28/2013  ? Procedure: TOTAL HIP ARTHROPLASTY ANTERIOR APPROACH;  Surgeon: Marybelle Killings, MD;  Location: [place];  Service: Orthopedics;  Laterality: Right;  Right Total Hip Arthroplasty, Direct Anterior Approach  ? ?Past Surgical History:  ?Procedure Laterality Date  ? ANKLE SURGERY Left   ? ligament  ? APPENDECTOMY  1987  ? AT [facility]  ? BACK SURGERY    ? Fusion  ? BREAST LUMPECTOMY  2003  ? radiation on right  ? BREAST LUMPECTOMY WITH RADIOACTIVE SEED LOCALIZATION Left 02/12/2016  ? Procedure: LEFT BREAST LUMPECTOMY WITH RADIOACTIVE SEED LOCALIZATION;  Surgeon: Excell Seltzer, MD;  Location: [place];  Service: General;  Laterality: Left;  ? CARDIOVASCULAR STRESS TEST  09/07/05  ? Nuclear, was negative  ? CATARACT EXTRACTION Bilateral 01,03  ? COLONOSCOPY WITH PROPOFOL N/A 05/04/2021  ? Procedure: COLONOSCOPY WITH PROPOFOL;  Surgeon: Gatha Mayer, MD;  [facility];  Service: Endoscopy;  Laterality: N/A;  ? ESOPHAGOGASTRODUODENOSCOPY (EGD) WITH PROPOFOL N/A 05/04/2021  ? Procedure: ESOPHAGOGASTRODUODENOSCOPY (EGD) WITH PROPOFOL;  Surgeon: Gatha Mayer, MD;  [facility];  Service: Endoscopy;  Laterality: N/A;  ? OOPHORECTOMY    ? LSO -RSO  ? PELVIC LAPAROSCOPY  1989  ? RSO, LYSIS OF ADHESIONS  ? POLYPECTOMY  05/04/2021  ? Procedure: POLYPECTOMY;  Surgeon: Gatha Mayer, MD;  Location: [facility];  Service:  Endoscopy;;  ? TOTAL ABDOMINAL HYSTERECTOMY  1987  ? LSO, APPENDECTOMY  ? TOTAL HIP ARTHROPLASTY Right 10/28/2013  ? dr Lorin Mercy  ? TOTAL HIP ARTHROPLASTY Right 10/28/2013  ? Procedure: TOTAL HIP ARTHROPLASTY ANTERIOR APPROACH;  Surgeon: Marybelle Killings, MD;  Location: [place];  Service: Orthopedics;  Laterality: Right;  Right Total Hip Arthroplasty, Direct Anterior Approach  ? ?Past Medical History:  ?Diagnosis Date  ? Anemia   ? hx  ? Arthritis   ? Asthma   ? PFTs, February, 2011, moderate obstructive disease with response to bronchodilators, normal lung volumes, moderate reduction in diffusing capacity  ? Atrial septal aneurysm   ? Echo, 2008-not  noted on 13 echo  ? CAD (coronary artery disease)   ? 90% distal LAD in the past  /   nuclear, 2008, no ischemia, ejection fraction 70%  ? Cancer [facility]) 2002  ? DUCTAL CIS--S/P LUMPECTOMY, RADIATION AND 6 WEEKS OF TAMOXIFEN  ? D-dimer, elevated   ? January, 2014  ? Depression   ? Ejection fraction   ? EF 60%, echo, October, 2008  ? Elevated CPK   ? January, 2014  ? Endometriosis 1989  ? RIGHT TUBE  ? Endometriosis 1987  ? LEFT TUBE/OVARY W FOCAL IN-SITU ENDOMETRIAL ADENOCARCINOMA  ? GERD (gastroesophageal reflux disease)   ? occ  ? H/O hiatal hernia   ? ?  ? Hyperlipidemia   ? Hypertension   ? Hypothyroidism   ? Patient has had in the past that she does not need treatment  ? Kyphoscoliosis   ? Obstructive airway disease ([place])   ? Pinched nerve   ? lower back  ? Shortness of breath   ? Echo 2/22: EF 60-65, no RWMA, mild LVH, GR 1  DD, GLS -24%, normal RVSF, mild MR, trivial AI, borderline dilation of aortic root (39 mm)  ? UTI (lower urinary tract infection)   ? ?BP 129/84 Comment: per patient at home  Pulse 88 Comment: per patient at home  Ht 5' (1.524 m)   Wt 154 lb (69.9 kg) Comment: per last visit  SpO2 96% Comment: per last visit  BMI 30.08 kg/m?  ? ?Opioid Risk Score:   ?Fall Risk Score:  `1 ? ?Depression screen PHQ 2/9 ? ? ?  08/13/2021  ? 11:36 AM 07/20/2021  ?  1:27 PM  06/22/2021  ?  2:48 PM 05/25/2021  ?  2:27 PM 03/11/2021  ? 12:48 PM 02/10/2021  ?  9:20 AM 11/27/2020  ?  1:10 PM  ?Depression screen PHQ 2/9  ?Decreased Interest '3 1 1 '$ 0 '3 3 1  '$ ?Down, Depressed, Hopeless '3 1 1 1 3 3 1  '$ ?PHQ - 2 [place]

## 2021-08-13 NOTE — Progress Notes (Deleted)
Cardiology Office Note:    Date:  08/13/2021   ID:  Hannah Scott, DOB Aug 25, 1950, MRN [id number]  PCP:  Binnie Rail, MD   [place]  Cardiologist:  Ena Dawley, MD  Advanced Practice Provider:  No care team member to display Electrophysiologist:  None   Referring MD: Binnie Rail, MD    History of Present Illness:    Hannah Scott is a 71 y.o. female with a hx of prior MI (1991, 35) secondary to coronary vasospasm, known distal LAD disease, HTN, HLD, atrial septal aneurysm, and asthma who was previously followed by Dr. Meda Coffee who presents to clinic for follow-up.   Saw Richardson Dopp on 06/05/20 where she was continuing to have shortness of breath with minimal activity over the past several months as well as chest/neck discomfort. Concerned for angina given history of vasospasm and known distal LAD disease in th past. Myoview 12/2019 with no ischemia; low risk. BNP on that visit 255.   Saw me on 06/19/20 where she continued to have SOB. TTE reassuringly normal with LVEF 60-65%, no WMA, no significant valve disease, RAP 3. Her amlodipine was stopped due to episodes of hypotension.   Was seen on 08/06/20 where she was continuing to have SOB that was worse with allergies. Saw pulm 10/29/20 who determined to continue to hold ACE and only use bystolic as BB. Was placed on prednisone at that time.  Last seen in clinic on 10/2020 where her breathing had improved.   Today, ***  Past Medical History:  Diagnosis Date   Anemia    hx   Arthritis    Asthma    PFTs, February, 2011, moderate obstructive disease with response to bronchodilators, normal lung volumes, moderate reduction in diffusing capacity   Atrial septal aneurysm    Echo, 2008-not noted on 13 echo   CAD (coronary artery disease)    90% distal LAD in the past  /   nuclear, 2008, no ischemia, ejection fraction 70%   Cancer ([place]) 2002   DUCTAL CIS--S/P LUMPECTOMY, RADIATION AND 6 WEEKS OF  TAMOXIFEN   D-dimer, elevated    January, 2014   Depression    Ejection fraction    EF 60%, echo, October, 2008   Elevated CPK    January, 2014   Endometriosis 1989   RIGHT TUBE   Endometriosis 1987   LEFT TUBE/OVARY W FOCAL IN-SITU ENDOMETRIAL ADENOCARCINOMA   GERD (gastroesophageal reflux disease)    occ   H/O hiatal hernia    ?   Hyperlipidemia    Hypertension    Hypothyroidism    Patient has had in the past that she does not need treatment   Kyphoscoliosis    Obstructive airway disease ([place])    Pinched nerve    lower back   Shortness of breath    Echo 2/22: EF 60-65, no RWMA, mild LVH, GR 1  DD, GLS -24%, normal RVSF, mild MR, trivial AI, borderline dilation of aortic root (39 mm)   UTI (lower urinary tract infection)     Past Surgical History:  Procedure Laterality Date   ANKLE SURGERY Left    ligament   APPENDECTOMY  1987   AT [facility]   BACK SURGERY     Fusion   BREAST LUMPECTOMY  2003   radiation on right   BREAST LUMPECTOMY WITH RADIOACTIVE SEED LOCALIZATION Left 02/12/2016   Procedure: LEFT BREAST LUMPECTOMY WITH RADIOACTIVE SEED LOCALIZATION;  Surgeon: Excell Seltzer, MD;  Location: [place];  Service: General;  Laterality: Left;   CARDIOVASCULAR STRESS TEST  09/07/05   Nuclear, was negative   CATARACT EXTRACTION Bilateral 01,03   COLONOSCOPY WITH PROPOFOL N/A 05/04/2021   Procedure: COLONOSCOPY WITH PROPOFOL;  Surgeon: Gatha Mayer, MD;  [facility];  Service: Endoscopy;  Laterality: N/A;   ESOPHAGOGASTRODUODENOSCOPY (EGD) WITH PROPOFOL N/A 05/04/2021   Procedure: ESOPHAGOGASTRODUODENOSCOPY (EGD) WITH PROPOFOL;  Surgeon: Gatha Mayer, MD;  [facility];  Service: Endoscopy;  Laterality: N/A;   OOPHORECTOMY     LSO -RSO   PELVIC LAPAROSCOPY  1989   RSO, LYSIS OF ADHESIONS   POLYPECTOMY  05/04/2021   Procedure: POLYPECTOMY;  Surgeon: Gatha Mayer, MD;  [facility];  Service: Endoscopy;;   TOTAL  ABDOMINAL HYSTERECTOMY  1987   LSO, APPENDECTOMY   TOTAL HIP ARTHROPLASTY Right 10/28/2013   dr Lorin Mercy   TOTAL HIP ARTHROPLASTY Right 10/28/2013   Procedure: TOTAL HIP ARTHROPLASTY ANTERIOR APPROACH;  Surgeon: Marybelle Killings, MD;  Location: [place];  Service: Orthopedics;  Laterality: Right;  Right Total Hip Arthroplasty, Direct Anterior Approach    Current Medications: No outpatient medications have been marked as taking for the 08/18/21 encounter (Appointment) with Freada Bergeron, MD.     Allergies:   Fluticasone-salmeterol, Norvasc [amlodipine besylate], and Tape   Social History   Socioeconomic History   Marital status: Widowed    Spouse name: Not on file   Number of children: Not on file   Years of education: Not on file   Highest education level: Not on file  Occupational History   Not on file  Tobacco Use   Smoking status: Never   Smokeless tobacco: Never  Vaping Use   Vaping Use: Never used  Substance and Sexual Activity   Alcohol use: No    Alcohol/week: 0.0 standard drinks   Drug use: No   Sexual activity: Not Currently    Birth control/protection: Surgical  Other Topics Concern   Not on file  Social History Narrative   Widowed in 2015   2 sons 1 lives in the area the other is in close contact    1 grandchild   Never smoker no drug use no alcohol   Social Determinants of Radio broadcast assistant Strain: Low Risk    Difficulty of Paying Living Expenses: Not hard at all  Food Insecurity: No Food Insecurity   Worried About Charity fundraiser in the Last Year: Never true   [place] in the Last Year: Never true  Transportation Needs: No Transportation Needs   Lack of Transportation (Medical): No   Lack of Transportation (Non-Medical): No  Physical Activity: Inactive   Days of Exercise per Week: 0 days   Minutes of Exercise per Session: 0 min  Stress: No Stress Concern Present   Feeling of Stress : Not at all  Social Connections: Moderately  Integrated   Frequency of Communication with Friends and Family: More than three times a week   Frequency of Social Gatherings with Friends and Family: More than three times a week   Attends Religious Services: More than 4 times per year   Active Member of Clubs or Organizations: No   Attends Music therapist: More than 4 times per year   Marital Status: Widowed     Family History: The patient's  family history includes Asthma in her paternal uncle; Heart attack in her father and paternal uncle;  Heart disease in her father and mother; Heart failure in her mother; Hypertension in her maternal grandfather and mother; Pneumonia in her mother; Stroke in her maternal grandfather.  ROS:   Please see the history of present illness.    Review of Systems  Constitutional:  Positive for malaise/fatigue. Negative for fever and weight loss.  HENT:  Negative for hearing loss.   Eyes:  Negative for blurred vision and redness.  Respiratory:  Positive for shortness of breath.   Cardiovascular:  Negative for chest pain, palpitations, orthopnea, claudication, leg swelling and PND.  Gastrointestinal:  Negative for melena, nausea and vomiting.  Genitourinary:  Negative for dysuria and flank pain.  Musculoskeletal:  Positive for joint pain.  Neurological:  Negative for dizziness and loss of consciousness.  Endo/Heme/Allergies:  Negative for polydipsia.  Psychiatric/Behavioral:  Positive for depression.    EKGs/Labs/Other Studies Reviewed:    The following studies were reviewed today: TTE 07/07/2019: IMPRESSIONS   1. Left ventricular ejection fraction, by estimation, is 60 to 65%. The  left ventricle has normal function. The left ventricle has no regional  wall motion abnormalities. There is mild left ventricular hypertrophy.  Left ventricular diastolic parameters  are consistent with Grade I diastolic dysfunction (impaired relaxation).  The average left ventricular global longitudinal  strain is -24.0 %. The  global longitudinal strain is normal.   2. Right ventricular systolic function is normal. The right ventricular  size is normal. Tricuspid regurgitation signal is inadequate for assessing  PA pressure.   3. The mitral valve is normal in structure. Mild mitral valve  regurgitation. No evidence of mitral stenosis.   4. The aortic valve was not well visualized. Aortic valve regurgitation  is trivial. No aortic stenosis is present.   5. There is borderline dilatation of the aortic root, measuring 39 mm.   6. The inferior vena cava is normal in size with greater than 50%  respiratory variability, suggesting right atrial pressure of 3 mmHg.    TTE 2019: Study Conclusions   - Left ventricle: The cavity size was normal. Wall thickness was    increased in a pattern of moderate LVH. Systolic function was    normal. The estimated ejection fraction was in the range of 55%    to 60%. Wall motion was normal; there were no regional wall    motion abnormalities. Doppler parameters are consistent with    abnormal left ventricular relaxation (grade 1 diastolic    dysfunction).  - Aortic valve: There was trivial regurgitation.  - Ascending aorta: The ascending aorta was mildly dilated.  - Mitral valve: Calcified annulus. Mildly thickened leaflets .   Impressions:   - Normal LV systolic function; moderate LVH; mild diastolic    dysfunction; trace AI; mildly dilated ascending aorta.    Myoview 12/2019: The left ventricular ejection fraction is normal (55-65%). Nuclear stress EF: 64%. No wall motion abnormalities There was no ST segment deviation noted during stress. The study is normal. This is a low risk study. There is no infarct or ischemia identified.    Recent Labs: 01/11/2021: ALT 18; BUN 17; Creatinine, Ser 0.85; Potassium 4.7; Sodium 140 07/19/2021: Hemoglobin 12.3; Platelets 198.0; TSH 34.97  Recent Lipid Panel    Component Value Date/Time   CHOL 120 07/19/2021  1445   TRIG 81.0 07/19/2021 1445   HDL 53.90 07/19/2021 1445   CHOLHDL 2 07/19/2021 1445   VLDL 16.2 07/19/2021 1445   LDLCALC 50 07/19/2021 1445   LDLCALC 61 12/13/2019 1430  LDLDIRECT 154.8 02/20/2009 1116     Risk Assessment/Calculations:       Physical Exam:    VS:  There were no vitals taken for this visit.    Wt Readings from Last 3 Encounters:  08/13/21 154 lb (69.9 kg)  07/26/21 154 lb (69.9 kg)  07/20/21 158 lb (71.7 kg)     GEN:  Comfortable, NAD HEENT: Normal NECK: No JVD; No carotid bruits. Scoliosis present CARDIAC: RR, 2/6 systolic murmur. No rubs or gallops RESPIRATORY:  CTAB, no wheezes ABDOMEN: Soft, non-tender, non-distended MUSCULOSKELETAL:  No edema; No deformity  SKIN: Warm and dry NEUROLOGIC:  Alert and oriented x 3 PSYCHIATRIC:  Normal affect   ASSESSMENT:    No diagnosis found.   PLAN:    In order of problems listed above:  #CAD with distal LAD #Vasospasm: Myoview 12/2019 with no evidence of ischemia. TTE with normal BiV function, no significant valvular abnormalities. Patient has rare, intermittent neck tightness but this is non-exertional and only lasts a couple of seconds before abating. Was trialed on amlodipine for possible vasospasm, however, no significant change in symptoms and patient has noted more frequent episodes of hypotension. Given reassuring cardiac work-up, no further testing needed at this time.  -Cardiac work-up including myoview and TTE reassuringly normal -Continue ASA and statin -Metop changed to bystolic '10mg'$  daily as most beta 1 selective and less likely to exacerbate her asthma -Holding amlodipine due to episodes of hypotension at home   #DOE: #Asthma: Cardiac work-up including BNP, TTE and myoview reassuringly normal. Suspect symptoms may be related to underlying asthma as often flares during this time of year. Now followed by Pulm. -Management per pulm -Cardiac work-up including TTE, BNP and myoview  reassuring -Continue inhalers   #HTN: Elevated today but better controlled at home. Orthostasis significantly improved. -Stopped amlodipine given episodes of hypotension with dizziness at home -Stopped benzapril due to concern for contribution to cough -Continue irbesartan '75mg'$  daily -Metop changed to bystolic '10mg'$  daily due to more beta-1 selectivity -Monitor blood pressures at home   #HLD: LDL 61. -Continue atorvastatin '20mg'$  daily  #Obesity: BMI 32. -Continue lifestyle modifications -Declined referral to weight loss clinic   Medication Adjustments/Labs and Tests Ordered: Current medicines are reviewed at length with the patient today.  Concerns regarding medicines are outlined above.  No orders of the defined types were placed in this encounter.  No orders of the defined types were placed in this encounter.   There are no Patient Instructions on file for this visit.    Signed, Freada Bergeron, MD  08/13/2021 3:27 PM    [place]

## 2021-08-18 ENCOUNTER — Ambulatory Visit: Payer: Medicare Other | Admitting: Cardiology

## 2021-08-18 ENCOUNTER — Other Ambulatory Visit (INDEPENDENT_AMBULATORY_CARE_PROVIDER_SITE_OTHER): Payer: Medicare Other

## 2021-08-18 DIAGNOSIS — I10 Essential (primary) hypertension: Secondary | ICD-10-CM | POA: Diagnosis not present

## 2021-08-18 DIAGNOSIS — E039 Hypothyroidism, unspecified: Secondary | ICD-10-CM | POA: Diagnosis not present

## 2021-08-18 DIAGNOSIS — E119 Type 2 diabetes mellitus without complications: Secondary | ICD-10-CM

## 2021-08-18 LAB — COMPREHENSIVE METABOLIC PANEL
ALT: 27 U/L (ref 0–35)
AST: 31 U/L (ref 0–37)
Albumin: 3.9 g/dL (ref 3.5–5.2)
Alkaline Phosphatase: 100 U/L (ref 39–117)
BUN: 16 mg/dL (ref 6–23)
CO2: 38 mEq/L — ABNORMAL HIGH (ref 19–32)
Calcium: 9.7 mg/dL (ref 8.4–10.5)
Chloride: 92 mEq/L — ABNORMAL LOW (ref 96–112)
Creatinine, Ser: 0.85 mg/dL (ref 0.40–1.20)
GFR: 68.63 mL/min (ref >60.00)
Glucose, Bld: 91 mg/dL (ref 70–99)
Potassium: 3.8 mEq/L (ref 3.5–5.1)
Sodium: 137 mEq/L (ref 135–145)
Total Bilirubin: 1.1 mg/dL (ref 0.2–1.2)
Total Protein: 6.2 g/dL (ref 6.0–8.3)

## 2021-08-18 LAB — TSH: TSH: 3.23 u[IU]/mL (ref 0.35–5.50)

## 2021-08-19 ENCOUNTER — Other Ambulatory Visit: Payer: Self-pay

## 2021-08-19 DIAGNOSIS — J3802 Paralysis of vocal cords and larynx, bilateral: Secondary | ICD-10-CM | POA: Diagnosis not present

## 2021-08-19 DIAGNOSIS — E119 Type 2 diabetes mellitus without complications: Secondary | ICD-10-CM

## 2021-08-19 DIAGNOSIS — E042 Nontoxic multinodular goiter: Secondary | ICD-10-CM | POA: Diagnosis not present

## 2021-08-20 ENCOUNTER — Ambulatory Visit: Payer: Medicare Other | Admitting: Registered Nurse

## 2021-08-20 NOTE — Progress Notes (Signed)
?Cardiology Office Note:   ? ?Date:  08/24/2021  ? ?ID:  Hannah Scott, DOB [birth year], MRN [id number] ? ?PCP:  Binnie Rail, MD ?  ?[place]  ?Cardiologist:  Ena Dawley, MD  ?Advanced Practice Provider:  No care team member to display ?Electrophysiologist:  None  ? ?Referring MD: Binnie Rail, MD  ? ? ?History of Present Illness:   ? ?Hannah Scott is a 71 y.o. female with a hx of prior MI (1991, 66) secondary to coronary vasospasm, known distal LAD disease, HTN, HLD, atrial septal aneurysm, and asthma who was previously followed by Dr. Meda Coffee who presents to clinic for follow-up. ?  ?Saw Richardson Dopp on 06/05/20 where she was continuing to have shortness of breath with minimal activity over the past several months as well as chest/neck discomfort. Concerned for angina given history of vasospasm and known distal LAD disease in th past. Myoview 12/2019 with no ischemia; low risk. BNP on that visit 255.  ? ?Saw me on 06/19/20 where she continued to have SOB. TTE reassuringly normal with LVEF 60-65%, no WMA, no significant valve disease, RAP 3. Her amlodipine was stopped due to episodes of hypotension. ?  ?Was seen on 08/06/20 where she was continuing to have SOB that was worse with allergies. Saw pulm 10/29/20 who determined to continue to hold ACE and only use bystolic as BB. Was placed on prednisone at that time. ? ?Last seen in clinic on 10/2020 where her breathing had improved.  ? ?Today, the patient states that she feels very weak and tired. Breathing continued to be labored. Also undergoing work-up for her hoarse voice. Blood pressure is well controlled. TSH is now normal. No orthopnea, PND, palpitations. No lightheadedness or chest pain. Continues to have trace LE edema.  ? ?Past Medical History:  ?Diagnosis Date  ? Anemia   ? hx  ? Arthritis   ? Asthma   ? PFTs, February, 2011, moderate obstructive disease with response to bronchodilators, normal lung volumes, moderate reduction  in diffusing capacity  ? Atrial septal aneurysm   ? Echo, 2008-not noted on 13 echo  ? CAD (coronary artery disease)   ? 90% distal LAD in the past  /   nuclear, 2008, no ischemia, ejection fraction 70%  ? [facility]) 2002  ? DUCTAL CIS--S/P LUMPECTOMY, RADIATION AND 6 WEEKS OF TAMOXIFEN  ? D-dimer, elevated   ? January, 2014  ? Depression   ? Ejection fraction   ? EF 60%, echo, October, 2008  ? Elevated CPK   ? January, 2014  ? Endometriosis 1989  ? RIGHT TUBE  ? Endometriosis 1987  ? LEFT TUBE/OVARY W FOCAL IN-SITU ENDOMETRIAL ADENOCARCINOMA  ? GERD (gastroesophageal reflux disease)   ? occ  ? H/O hiatal hernia   ? ?  ? Hyperlipidemia   ? Hypertension   ? Hypothyroidism   ? Patient has had in the past that she does not need treatment  ? Kyphoscoliosis   ? Obstructive airway disease ([place])   ? Pinched nerve   ? lower back  ? Shortness of breath   ? Echo 2/22: EF 60-65, no RWMA, mild LVH, GR 1  DD, GLS -24%, normal RVSF, mild MR, trivial AI, borderline dilation of aortic root (39 mm)  ? UTI (lower urinary tract infection)   ? ? ?Past Surgical History:  ?Procedure Laterality Date  ? ANKLE SURGERY Left   ? ligament  ? APPENDECTOMY  1987  ? AT [facility]  ?  BACK SURGERY    ? Fusion  ? BREAST LUMPECTOMY  2003  ? radiation on right  ? BREAST LUMPECTOMY WITH RADIOACTIVE SEED LOCALIZATION Left 02/12/2016  ? Procedure: LEFT BREAST LUMPECTOMY WITH RADIOACTIVE SEED LOCALIZATION;  Surgeon: Excell Seltzer, MD;  Location: [place];  Service: General;  Laterality: Left;  ? CARDIOVASCULAR STRESS TEST  09/07/05  ? Nuclear, was negative  ? CATARACT EXTRACTION Bilateral 01,03  ? COLONOSCOPY WITH PROPOFOL N/A 05/04/2021  ? Procedure: COLONOSCOPY WITH PROPOFOL;  Surgeon: Gatha Mayer, MD;  [facility];  Service: Endoscopy;  Laterality: N/A;  ? ESOPHAGOGASTRODUODENOSCOPY (EGD) WITH PROPOFOL N/A 05/04/2021  ? Procedure: ESOPHAGOGASTRODUODENOSCOPY (EGD) WITH PROPOFOL;  Surgeon: Gatha Mayer, MD;  [facility];  Service: Endoscopy;  Laterality: N/A;  ? OOPHORECTOMY    ? LSO -RSO  ? PELVIC LAPAROSCOPY  1989  ? RSO, LYSIS OF ADHESIONS  ? POLYPECTOMY  05/04/2021  ? Procedure: POLYPECTOMY;  Surgeon: Gatha Mayer, MD;  Location: [facility];  Service: Endoscopy;;  ? TOTAL ABDOMINAL HYSTERECTOMY  1987  ? LSO, APPENDECTOMY  ? TOTAL HIP ARTHROPLASTY Right 10/28/2013  ? dr Lorin Mercy  ? TOTAL HIP ARTHROPLASTY Right 10/28/2013  ? Procedure: TOTAL HIP ARTHROPLASTY ANTERIOR APPROACH;  Surgeon: Marybelle Killings, MD;  Location: [place];  Service: Orthopedics;  Laterality: Right;  Right Total Hip Arthroplasty, Direct Anterior Approach  ? ? ?Current Medications: ?Current Meds  ?Medication Sig  ? albuterol (VENTOLIN HFA) 108 (90 Base) MCG/ACT inhaler Inhale 2 puffs into the lungs every 4 (four) hours as needed for wheezing or shortness of breath.  ? aspirin EC 81 MG tablet Take 1 tablet (81 mg total) by mouth daily. Swallow whole.  ? atorvastatin (LIPITOR) 20 MG tablet Take 1 tablet (20 mg total) by mouth daily.  ? bisoprolol (ZEBETA) 5 MG tablet Take 5 mg by mouth daily.  ? DULoxetine (CYMBALTA) 30 MG capsule TAKE ONE CAPSULE EVERY DAY IN ADDITION TO '60MG'$  CAPSULE FOR A TOTAL OF '90MG'$  DAILY  ? DULoxetine (CYMBALTA) 60 MG capsule TAKE ONE CAPSULE BY MOUTH DAILY. TAKE ALONG WITH '30MG'$  CAPSULE. TOTAL DAILY DOSE = 90 MG.  ? famotidine (PEPCID) 20 MG tablet One after supper  ? fluticasone furoate-vilanterol (BREO ELLIPTA) 100-25 MCG/INH AEPB Inhale 1 puff into the lungs daily.  ? gabapentin (NEURONTIN) 600 MG tablet TAKE ONE TABLET EVERY DAY AT BEDTIME  ? hydrochlorothiazide (HYDRODIURIL) 12.5 MG tablet Take 1 tablet (12.5 mg total) by mouth daily.  ? HYDROcodone-acetaminophen (NORCO) 10-325 MG tablet Take 1 tablet by mouth every 8 (eight) hours as needed.  ? ipratropium-albuterol (DUONEB) 0.5-2.5 (3) MG/3ML SOLN Take 3 mLs by nebulization every 4 (four) hours as needed (wheezing or SOB). During exacerbation  ? irbesartan (AVAPRO) 150 MG tablet  Take 150 mg by mouth daily.  ? levothyroxine (SYNTHROID) 100 MCG tablet Take 1 tablet (100 mcg total) by mouth daily.  ? Vitamin D, Ergocalciferol, (DRISDOL) 1.25 MG (50000 UNIT) CAPS capsule TAKE ONE CAPSULE BY MOUTH ONCE A WEEK  ?  ? ?Allergies:   Fluticasone-salmeterol, Norvasc [amlodipine besylate], and Tape  ? ?Social History  ? ?Socioeconomic History  ? Marital status: Widowed  ?  Spouse name: Not on file  ? Number of children: Not on file  ? Years of education: Not on file  ? Highest education level: Not on file  ?Occupational History  ? Not on file  ?Tobacco Use  ? Smoking status: Never  ? Smokeless tobacco: Never  ?Vaping Use  ?  Vaping Use: Never used  ?Substance and Sexual Activity  ? Alcohol use: No  ?  Alcohol/week: 0.0 standard drinks  ? Drug use: No  ? Sexual activity: Not Currently  ?  Birth control/protection: Surgical  ?Other Topics Concern  ? Not on file  ?Social History Narrative  ? Widowed in 2015  ? 2 sons 1 lives in the area the other is in close contact   ? 1 grandchild  ? Never smoker no drug use no alcohol  ? ?Social Determinants of Health  ? ?Financial Resource Strain: Low Risk   ? Difficulty of Paying Living Expenses: Not hard at all  ?Food Insecurity: No Food Insecurity  ? Worried About Charity fundraiser in the Last Year: Never true  ? Ran Out of Food in the Last Year: Never true  ?Transportation Needs: No Transportation Needs  ? Lack of Transportation (Medical): No  ? Lack of Transportation (Non-Medical): No  ?Physical Activity: Inactive  ? Days of Exercise per Week: 0 days  ? Minutes of Exercise per Session: 0 min  ?Stress: No Stress Concern Present  ? Feeling of Stress : Not at all  ?Social Connections: Moderately Integrated  ? Frequency of Communication with Friends and Family: More than three times a week  ? Frequency of Social Gatherings with Friends and Family: More than three times a week  ? Attends Religious Services: More than 4 times per year  ? Active Member of Clubs or  Organizations: No  ? Attends Archivist Meetings: More than 4 times per year  ? Marital Status: Widowed  ?  ? ?Family History: ?The patient's  ?family history includes Asthma in her paternal uncle;

## 2021-08-24 ENCOUNTER — Encounter: Payer: Self-pay | Admitting: Cardiology

## 2021-08-24 ENCOUNTER — Ambulatory Visit: Payer: Medicare Other | Admitting: Cardiology

## 2021-08-24 VITALS — BP 126/80 | HR 85 | Ht 60.0 in | Wt 154.8 lb

## 2021-08-24 DIAGNOSIS — E782 Mixed hyperlipidemia: Secondary | ICD-10-CM

## 2021-08-24 DIAGNOSIS — J4520 Mild intermittent asthma, uncomplicated: Secondary | ICD-10-CM | POA: Diagnosis not present

## 2021-08-24 DIAGNOSIS — I10 Essential (primary) hypertension: Secondary | ICD-10-CM | POA: Diagnosis not present

## 2021-08-24 DIAGNOSIS — R0602 Shortness of breath: Secondary | ICD-10-CM | POA: Diagnosis not present

## 2021-08-24 DIAGNOSIS — I2510 Atherosclerotic heart disease of native coronary artery without angina pectoris: Secondary | ICD-10-CM | POA: Diagnosis not present

## 2021-08-24 DIAGNOSIS — R0609 Other forms of dyspnea: Secondary | ICD-10-CM

## 2021-08-24 DIAGNOSIS — I739 Peripheral vascular disease, unspecified: Secondary | ICD-10-CM

## 2021-08-24 NOTE — Patient Instructions (Signed)
Medication Instructions:   Your physician recommends that you continue on your current medications as directed. Please refer to the Current Medication list given to you today.  *If you need a refill on your cardiac medications before your next appointment, please call your pharmacy*   Follow-Up: At [facility], you and your health needs are our priority.  As part of our continuing mission to provide you with exceptional heart care, we have created designated Provider Care Teams.  These Care Teams include your primary Cardiologist (physician) and Advanced Practice Providers (APPs -  Physician Assistants and Nurse Practitioners) who all work together to provide you with the care you need, when you need it.  We recommend signing up for the patient portal called "MyChart".  Sign up information is provided on this After Visit Summary.  MyChart is used to connect with patients for Virtual Visits (Telemedicine).  Patients are able to view lab/test results, encounter notes, upcoming appointments, etc.  Non-urgent messages can be sent to your provider as well.   To learn more about what you can do with MyChart, go to [email or web address].    Your next appointment:   6 month(s)  The format for your next appointment:   In Person  Provider:   DR. PEMBERTON  Important Information About Sugar       

## 2021-09-09 DIAGNOSIS — E860 Dehydration: Secondary | ICD-10-CM | POA: Diagnosis not present

## 2021-09-09 DIAGNOSIS — M25551 Pain in right hip: Secondary | ICD-10-CM | POA: Diagnosis not present

## 2021-09-09 DIAGNOSIS — D72829 Elevated white blood cell count, unspecified: Secondary | ICD-10-CM | POA: Diagnosis not present

## 2021-09-09 DIAGNOSIS — A419 Sepsis, unspecified organism: Secondary | ICD-10-CM | POA: Diagnosis not present

## 2021-09-09 DIAGNOSIS — L03116 Cellulitis of left lower limb: Secondary | ICD-10-CM | POA: Diagnosis not present

## 2021-09-09 DIAGNOSIS — R079 Chest pain, unspecified: Secondary | ICD-10-CM | POA: Diagnosis not present

## 2021-09-09 DIAGNOSIS — R112 Nausea with vomiting, unspecified: Secondary | ICD-10-CM | POA: Diagnosis not present

## 2021-09-09 DIAGNOSIS — M25511 Pain in right shoulder: Secondary | ICD-10-CM | POA: Diagnosis not present

## 2021-09-09 DIAGNOSIS — J9811 Atelectasis: Secondary | ICD-10-CM | POA: Diagnosis not present

## 2021-09-09 DIAGNOSIS — I3481 Nonrheumatic mitral (valve) annulus calcification: Secondary | ICD-10-CM | POA: Diagnosis not present

## 2021-09-09 DIAGNOSIS — N179 Acute kidney failure, unspecified: Secondary | ICD-10-CM | POA: Diagnosis not present

## 2021-09-09 DIAGNOSIS — I272 Pulmonary hypertension, unspecified: Secondary | ICD-10-CM | POA: Diagnosis not present

## 2021-09-09 DIAGNOSIS — R918 Other nonspecific abnormal finding of lung field: Secondary | ICD-10-CM | POA: Diagnosis not present

## 2021-09-09 DIAGNOSIS — K7200 Acute and subacute hepatic failure without coma: Secondary | ICD-10-CM | POA: Diagnosis not present

## 2021-09-09 DIAGNOSIS — R609 Edema, unspecified: Secondary | ICD-10-CM | POA: Diagnosis not present

## 2021-09-09 DIAGNOSIS — N3001 Acute cystitis with hematuria: Secondary | ICD-10-CM | POA: Diagnosis not present

## 2021-09-09 DIAGNOSIS — M79621 Pain in right upper arm: Secondary | ICD-10-CM | POA: Diagnosis not present

## 2021-09-09 DIAGNOSIS — I214 Non-ST elevation (NSTEMI) myocardial infarction: Secondary | ICD-10-CM | POA: Diagnosis not present

## 2021-09-09 DIAGNOSIS — R7989 Other specified abnormal findings of blood chemistry: Secondary | ICD-10-CM | POA: Diagnosis not present

## 2021-09-09 DIAGNOSIS — R6521 Severe sepsis with septic shock: Secondary | ICD-10-CM | POA: Diagnosis not present

## 2021-09-09 DIAGNOSIS — I10 Essential (primary) hypertension: Secondary | ICD-10-CM | POA: Diagnosis not present

## 2021-09-09 DIAGNOSIS — Z96641 Presence of right artificial hip joint: Secondary | ICD-10-CM | POA: Diagnosis not present

## 2021-09-09 DIAGNOSIS — R197 Diarrhea, unspecified: Secondary | ICD-10-CM | POA: Diagnosis not present

## 2021-09-09 DIAGNOSIS — I081 Rheumatic disorders of both mitral and tricuspid valves: Secondary | ICD-10-CM | POA: Diagnosis not present

## 2021-09-09 DIAGNOSIS — K449 Diaphragmatic hernia without obstruction or gangrene: Secondary | ICD-10-CM | POA: Diagnosis not present

## 2021-09-10 ENCOUNTER — Inpatient Hospital Stay (HOSPITAL_COMMUNITY)
Admission: AD | Admit: 2021-09-10 | Discharge: 2021-09-21 | DRG: 871 | Disposition: A | Discharge status: Skilled Nursing Facility | Payer: Medicare Other | Source: Other Acute Inpatient Hospital | Attending: Family Medicine | Admitting: Family Medicine

## 2021-09-10 DIAGNOSIS — M79621 Pain in right upper arm: Secondary | ICD-10-CM | POA: Diagnosis not present

## 2021-09-10 DIAGNOSIS — E876 Hypokalemia: Secondary | ICD-10-CM | POA: Diagnosis not present

## 2021-09-10 DIAGNOSIS — M25551 Pain in right hip: Secondary | ICD-10-CM | POA: Diagnosis not present

## 2021-09-10 DIAGNOSIS — I214 Non-ST elevation (NSTEMI) myocardial infarction: Secondary | ICD-10-CM | POA: Diagnosis not present

## 2021-09-10 DIAGNOSIS — R079 Chest pain, unspecified: Secondary | ICD-10-CM | POA: Diagnosis not present

## 2021-09-10 DIAGNOSIS — I2510 Atherosclerotic heart disease of native coronary artery without angina pectoris: Secondary | ICD-10-CM | POA: Diagnosis present

## 2021-09-10 DIAGNOSIS — I272 Pulmonary hypertension, unspecified: Secondary | ICD-10-CM | POA: Diagnosis not present

## 2021-09-10 DIAGNOSIS — Y92009 Unspecified place in unspecified non-institutional (private) residence as the place of occurrence of the external cause: Secondary | ICD-10-CM

## 2021-09-10 DIAGNOSIS — G8929 Other chronic pain: Secondary | ICD-10-CM | POA: Diagnosis present

## 2021-09-10 DIAGNOSIS — E860 Dehydration: Secondary | ICD-10-CM | POA: Diagnosis not present

## 2021-09-10 DIAGNOSIS — I10 Essential (primary) hypertension: Secondary | ICD-10-CM | POA: Diagnosis not present

## 2021-09-10 DIAGNOSIS — R945 Abnormal results of liver function studies: Secondary | ICD-10-CM | POA: Diagnosis not present

## 2021-09-10 DIAGNOSIS — R0602 Shortness of breath: Secondary | ICD-10-CM | POA: Diagnosis not present

## 2021-09-10 DIAGNOSIS — R5381 Other malaise: Secondary | ICD-10-CM

## 2021-09-10 DIAGNOSIS — N179 Acute kidney failure, unspecified: Secondary | ICD-10-CM

## 2021-09-10 DIAGNOSIS — J90 Pleural effusion, not elsewhere classified: Secondary | ICD-10-CM | POA: Diagnosis not present

## 2021-09-10 DIAGNOSIS — Z96641 Presence of right artificial hip joint: Secondary | ICD-10-CM | POA: Diagnosis not present

## 2021-09-10 DIAGNOSIS — R7989 Other specified abnormal findings of blood chemistry: Secondary | ICD-10-CM | POA: Diagnosis not present

## 2021-09-10 DIAGNOSIS — R040 Epistaxis: Secondary | ICD-10-CM | POA: Diagnosis not present

## 2021-09-10 DIAGNOSIS — Z9841 Cataract extraction status, right eye: Secondary | ICD-10-CM

## 2021-09-10 DIAGNOSIS — E785 Hyperlipidemia, unspecified: Secondary | ICD-10-CM | POA: Diagnosis not present

## 2021-09-10 DIAGNOSIS — M419 Scoliosis, unspecified: Secondary | ICD-10-CM | POA: Diagnosis not present

## 2021-09-10 DIAGNOSIS — F1120 Opioid dependence, uncomplicated: Secondary | ICD-10-CM | POA: Diagnosis present

## 2021-09-10 DIAGNOSIS — D696 Thrombocytopenia, unspecified: Secondary | ICD-10-CM | POA: Diagnosis not present

## 2021-09-10 DIAGNOSIS — A419 Sepsis, unspecified organism: Secondary | ICD-10-CM | POA: Diagnosis not present

## 2021-09-10 DIAGNOSIS — M25511 Pain in right shoulder: Secondary | ICD-10-CM | POA: Diagnosis present

## 2021-09-10 DIAGNOSIS — E46 Unspecified protein-calorie malnutrition: Secondary | ICD-10-CM | POA: Diagnosis not present

## 2021-09-10 DIAGNOSIS — R609 Edema, unspecified: Secondary | ICD-10-CM | POA: Diagnosis not present

## 2021-09-10 DIAGNOSIS — Z91048 Other nonmedicinal substance allergy status: Secondary | ICD-10-CM

## 2021-09-10 DIAGNOSIS — I700 Atherosclerosis of aorta: Secondary | ICD-10-CM | POA: Diagnosis not present

## 2021-09-10 DIAGNOSIS — Z888 Allergy status to other drugs, medicaments and biological substances status: Secondary | ICD-10-CM | POA: Diagnosis not present

## 2021-09-10 DIAGNOSIS — Z79899 Other long term (current) drug therapy: Secondary | ICD-10-CM

## 2021-09-10 DIAGNOSIS — I517 Cardiomegaly: Secondary | ICD-10-CM | POA: Diagnosis not present

## 2021-09-10 DIAGNOSIS — L03116 Cellulitis of left lower limb: Secondary | ICD-10-CM | POA: Diagnosis not present

## 2021-09-10 DIAGNOSIS — Z885 Allergy status to narcotic agent status: Secondary | ICD-10-CM | POA: Diagnosis not present

## 2021-09-10 DIAGNOSIS — Z8542 Personal history of malignant neoplasm of other parts of uterus: Secondary | ICD-10-CM

## 2021-09-10 DIAGNOSIS — Z7982 Long term (current) use of aspirin: Secondary | ICD-10-CM

## 2021-09-10 DIAGNOSIS — N3001 Acute cystitis with hematuria: Secondary | ICD-10-CM | POA: Diagnosis not present

## 2021-09-10 DIAGNOSIS — I252 Old myocardial infarction: Secondary | ICD-10-CM

## 2021-09-10 DIAGNOSIS — J9811 Atelectasis: Secondary | ICD-10-CM | POA: Diagnosis not present

## 2021-09-10 DIAGNOSIS — Z8249 Family history of ischemic heart disease and other diseases of the circulatory system: Secondary | ICD-10-CM

## 2021-09-10 DIAGNOSIS — R112 Nausea with vomiting, unspecified: Secondary | ICD-10-CM | POA: Diagnosis not present

## 2021-09-10 DIAGNOSIS — F339 Major depressive disorder, recurrent, unspecified: Secondary | ICD-10-CM | POA: Diagnosis not present

## 2021-09-10 DIAGNOSIS — R197 Diarrhea, unspecified: Secondary | ICD-10-CM | POA: Diagnosis not present

## 2021-09-10 DIAGNOSIS — R918 Other nonspecific abnormal finding of lung field: Secondary | ICD-10-CM | POA: Diagnosis not present

## 2021-09-10 DIAGNOSIS — Z9842 Cataract extraction status, left eye: Secondary | ICD-10-CM

## 2021-09-10 DIAGNOSIS — K449 Diaphragmatic hernia without obstruction or gangrene: Secondary | ICD-10-CM | POA: Diagnosis present

## 2021-09-10 DIAGNOSIS — D72829 Elevated white blood cell count, unspecified: Secondary | ICD-10-CM | POA: Diagnosis not present

## 2021-09-10 DIAGNOSIS — E119 Type 2 diabetes mellitus without complications: Secondary | ICD-10-CM

## 2021-09-10 DIAGNOSIS — R6521 Severe sepsis with septic shock: Secondary | ICD-10-CM | POA: Diagnosis present

## 2021-09-10 DIAGNOSIS — K824 Cholesterolosis of gallbladder: Secondary | ICD-10-CM | POA: Diagnosis not present

## 2021-09-10 DIAGNOSIS — I3481 Nonrheumatic mitral (valve) annulus calcification: Secondary | ICD-10-CM | POA: Diagnosis not present

## 2021-09-10 DIAGNOSIS — Z825 Family history of asthma and other chronic lower respiratory diseases: Secondary | ICD-10-CM | POA: Diagnosis not present

## 2021-09-10 DIAGNOSIS — F32A Depression, unspecified: Secondary | ICD-10-CM | POA: Diagnosis present

## 2021-09-10 DIAGNOSIS — K7200 Acute and subacute hepatic failure without coma: Secondary | ICD-10-CM | POA: Diagnosis present

## 2021-09-10 DIAGNOSIS — R0600 Dyspnea, unspecified: Secondary | ICD-10-CM | POA: Diagnosis not present

## 2021-09-10 DIAGNOSIS — I081 Rheumatic disorders of both mitral and tricuspid valves: Secondary | ICD-10-CM | POA: Diagnosis not present

## 2021-09-10 DIAGNOSIS — W19XXXA Unspecified fall, initial encounter: Secondary | ICD-10-CM | POA: Diagnosis present

## 2021-09-10 DIAGNOSIS — R5383 Other fatigue: Secondary | ICD-10-CM

## 2021-09-10 DIAGNOSIS — Z823 Family history of stroke: Secondary | ICD-10-CM

## 2021-09-10 DIAGNOSIS — R9431 Abnormal electrocardiogram [ECG] [EKG]: Secondary | ICD-10-CM | POA: Diagnosis not present

## 2021-09-10 DIAGNOSIS — E1169 Type 2 diabetes mellitus with other specified complication: Secondary | ICD-10-CM | POA: Diagnosis not present

## 2021-09-10 DIAGNOSIS — Z79890 Hormone replacement therapy (postmenopausal): Secondary | ICD-10-CM

## 2021-09-10 DIAGNOSIS — J45909 Unspecified asthma, uncomplicated: Secondary | ICD-10-CM | POA: Diagnosis not present

## 2021-09-10 DIAGNOSIS — E039 Hypothyroidism, unspecified: Secondary | ICD-10-CM | POA: Diagnosis not present

## 2021-09-10 DIAGNOSIS — Z961 Presence of intraocular lens: Secondary | ICD-10-CM | POA: Diagnosis present

## 2021-09-10 DIAGNOSIS — J189 Pneumonia, unspecified organism: Secondary | ICD-10-CM | POA: Diagnosis not present

## 2021-09-10 DIAGNOSIS — K219 Gastro-esophageal reflux disease without esophagitis: Secondary | ICD-10-CM | POA: Diagnosis present

## 2021-09-10 DIAGNOSIS — J811 Chronic pulmonary edema: Secondary | ICD-10-CM | POA: Diagnosis not present

## 2021-09-10 DIAGNOSIS — Z9181 History of falling: Secondary | ICD-10-CM | POA: Diagnosis not present

## 2021-09-10 DIAGNOSIS — N3090 Cystitis, unspecified without hematuria: Secondary | ICD-10-CM | POA: Diagnosis not present

## 2021-09-10 LAB — COMPREHENSIVE METABOLIC PANEL
ALT: 806 U/L — ABNORMAL HIGH (ref 0–44)
AST: 329 U/L — ABNORMAL HIGH (ref 15–41)
Albumin: 2.1 g/dL — ABNORMAL LOW (ref 3.5–5.0)
Alkaline Phosphatase: 82 U/L (ref 38–126)
Anion gap: 7 (ref 5–15)
BUN: 42 mg/dL — ABNORMAL HIGH (ref 8–23)
CO2: 28 mmol/L (ref 22–32)
Calcium: 8 mg/dL — ABNORMAL LOW (ref 8.9–10.3)
Chloride: 103 mmol/L (ref 98–111)
Creatinine, Ser: 1.4 mg/dL — ABNORMAL HIGH (ref 0.44–1.00)
GFR, Estimated: 40 mL/min — ABNORMAL LOW (ref >60)
Glucose, Bld: 131 mg/dL — ABNORMAL HIGH (ref 70–99)
Potassium: 4 mmol/L (ref 3.5–5.1)
Sodium: 138 mmol/L (ref 135–145)
Total Bilirubin: 5.5 mg/dL — ABNORMAL HIGH (ref 0.3–1.2)
Total Protein: 4.3 g/dL — ABNORMAL LOW (ref 6.5–8.1)

## 2021-09-10 LAB — LIPASE, BLOOD: Lipase: 33 U/L (ref 11–51)

## 2021-09-10 LAB — APTT: aPTT: 34 seconds (ref 24–36)

## 2021-09-10 LAB — PROTIME-INR
INR: 2 — ABNORMAL HIGH (ref 0.8–1.2)
Prothrombin Time: 22.5 seconds — ABNORMAL HIGH (ref 11.4–15.2)

## 2021-09-10 LAB — GLUCOSE, CAPILLARY
Glucose-Capillary: 117 mg/dL — ABNORMAL HIGH (ref 70–99)
Glucose-Capillary: 102 mg/dL — ABNORMAL HIGH (ref 70–99)

## 2021-09-10 LAB — TSH: TSH: 8.996 u[IU]/mL — ABNORMAL HIGH (ref 0.350–4.500)

## 2021-09-10 LAB — LACTIC ACID, PLASMA: Lactic Acid, Venous: 3 mmol/L (ref 0.5–1.9)

## 2021-09-10 LAB — MRSA NEXT GEN BY PCR, NASAL: MRSA by PCR Next Gen: NOT DETECTED

## 2021-09-10 LAB — PHOSPHORUS: Phosphorus: 3.6 mg/dL (ref 2.5–4.6)

## 2021-09-10 LAB — TROPONIN I (HIGH SENSITIVITY): Troponin I (High Sensitivity): 670 ng/L (ref <18)

## 2021-09-10 LAB — MAGNESIUM: Magnesium: 1.7 mg/dL (ref 1.7–2.4)

## 2021-09-10 LAB — PROCALCITONIN: Procalcitonin: 2.23 ng/mL

## 2021-09-10 MED ORDER — LACTATED RINGERS IV BOLUS
1000.0000 mL | Freq: Once | INTRAVENOUS | Status: AC
  Administered 2021-09-11: 1000 mL via INTRAVENOUS

## 2021-09-10 MED ORDER — LINEZOLID 600 MG/300ML IV SOLN
600.0000 mg | Freq: Two times a day (BID) | INTRAVENOUS | Status: DC
  Administered 2021-09-10 – 2021-09-14 (×8): 600 mg via INTRAVENOUS
  Filled 2021-09-10 (×9): qty 300

## 2021-09-10 MED ORDER — STERILE WATER FOR INJECTION IJ SOLN
INTRAMUSCULAR | Status: AC
  Administered 2021-09-11: 10 mL
  Filled 2021-09-10: qty 10

## 2021-09-10 MED ORDER — PANTOPRAZOLE SODIUM 40 MG PO TBEC
40.0000 mg | DELAYED_RELEASE_TABLET | Freq: Every day | ORAL | Status: DC
  Administered 2021-09-10 – 2021-09-11 (×2): 40 mg via ORAL
  Filled 2021-09-10 (×2): qty 1

## 2021-09-10 MED ORDER — ATORVASTATIN CALCIUM 10 MG PO TABS
20.0000 mg | ORAL_TABLET | Freq: Every day | ORAL | Status: DC
  Administered 2021-09-10 – 2021-09-11 (×2): 20 mg via ORAL
  Filled 2021-09-10 (×2): qty 2

## 2021-09-10 MED ORDER — LEVOTHYROXINE SODIUM 100 MCG PO TABS
100.0000 ug | ORAL_TABLET | Freq: Every day | ORAL | Status: DC
  Administered 2021-09-11 – 2021-09-16 (×4): 100 ug via ORAL
  Filled 2021-09-10 (×5): qty 1

## 2021-09-10 MED ORDER — NOREPINEPHRINE 4 MG/250ML-% IV SOLN
2.0000 ug/min | INTRAVENOUS | Status: DC
  Administered 2021-09-10: 1 ug/min via INTRAVENOUS
  Administered 2021-09-11: 4 ug/min via INTRAVENOUS
  Filled 2021-09-10 (×2): qty 250

## 2021-09-10 MED ORDER — CHLORHEXIDINE GLUCONATE CLOTH 2 % EX PADS
6.0000 | MEDICATED_PAD | Freq: Every day | CUTANEOUS | Status: DC
  Administered 2021-09-10 – 2021-09-13 (×3): 6 via TOPICAL

## 2021-09-10 MED ORDER — MOMETASONE FURO-FORMOTEROL FUM 100-5 MCG/ACT IN AERO
2.0000 | INHALATION_SPRAY | Freq: Two times a day (BID) | RESPIRATORY_TRACT | Status: DC
  Administered 2021-09-10 – 2021-09-21 (×18): 2 via RESPIRATORY_TRACT
  Filled 2021-09-10: qty 8.8

## 2021-09-10 MED ORDER — HEPARIN SODIUM (PORCINE) 5000 UNIT/ML IJ SOLN
5000.0000 [IU] | Freq: Three times a day (TID) | INTRAMUSCULAR | Status: DC
  Administered 2021-09-10: 5000 [IU] via SUBCUTANEOUS
  Filled 2021-09-10: qty 1

## 2021-09-10 MED ORDER — SODIUM CHLORIDE 0.9 % IV SOLN
2.0000 g | Freq: Two times a day (BID) | INTRAVENOUS | Status: DC
  Administered 2021-09-10 – 2021-09-14 (×8): 2 g via INTRAVENOUS
  Filled 2021-09-10 (×8): qty 12.5

## 2021-09-10 MED ORDER — SODIUM CHLORIDE 0.9 % IV SOLN
250.0000 mL | INTRAVENOUS | Status: DC
  Administered 2021-09-10 – 2021-09-13 (×3): 250 mL via INTRAVENOUS

## 2021-09-10 MED ORDER — IPRATROPIUM-ALBUTEROL 0.5-2.5 (3) MG/3ML IN SOLN
3.0000 mL | RESPIRATORY_TRACT | Status: DC | PRN
Start: 2021-09-10 — End: 2021-09-21
  Administered 2021-09-11 – 2021-09-14 (×2): 3 mL via RESPIRATORY_TRACT
  Filled 2021-09-10 (×2): qty 3

## 2021-09-10 MED ORDER — ASPIRIN 81 MG PO TBEC
81.0000 mg | DELAYED_RELEASE_TABLET | Freq: Every day | ORAL | Status: DC
  Administered 2021-09-10 – 2021-09-21 (×10): 81 mg via ORAL
  Filled 2021-09-10 (×10): qty 1

## 2021-09-10 MED ORDER — PHENTOLAMINE MESYLATE 5 MG IJ SOLR
10.0000 mg | Freq: Once | INTRAMUSCULAR | Status: AC
  Administered 2021-09-10: 10 mg via SUBCUTANEOUS
  Filled 2021-09-10 (×3): qty 10

## 2021-09-10 NOTE — Progress Notes (Signed)
Date and time results received: 09/10/21 2323 (use smartphrase ".now" to insert current time)  Test: Lactic Acid Critical Value: 3.0  Name of Provider Notified: Dr. Oletta Darter [facility])  Orders Received? Or Actions Taken?: Orders Received - See Orders for details

## 2021-09-10 NOTE — Progress Notes (Addendum)
eLink Physician-Brief Progress Note Patient Name: Hannah Scott DOB: June 01, 1950 MRN: [id number]   Date of Service  09/10/2021  HPI/Events of Note  Multiple issues: 1. Lactic Acid = 3.0. 2, Troponin = 670 in setting of sepsis and hypotension. EKG reveals normal sinus rhythm. Cannot rule out Anterior infarct , age undetermined. ST & T wave abnormality, consider inferior ischemia. Patient denies chest pain. Clinical picture c/w demand ischemia vs NSTEMI. Can't B-Block d/t vasopressor requirement. Cardiac echo already ordered. Last LVEF = 60-65% in Frebruary 2022.   eICU Interventions  Plan: ASA 81 mg PO now and Q day. Continue to trend Troponin. LR 1 liter IV over 1 hour now.      Intervention Category Major Interventions: Acid-Base disturbance - evaluation and management;Other:  Lysle Dingwall 09/10/2021, 11:42 PM

## 2021-09-10 NOTE — Progress Notes (Signed)
Patient arrived to unit via carelink at 1900. Night shift RN, Hayley met in room upon patient arrival where report was given and the skin assessment was done. Patient was attached to the monitor. VVS stable and no signs of distress at this time.

## 2021-09-10 NOTE — H&P (Addendum)
NAME:  Hannah Scott, MRN:  [id number], DOB:  [birth year], LOS: 0 ADMISSION DATE:  09/10/2021, CONSULTATION DATE:  5/19 REFERRING MD:  [facility], CHIEF COMPLAINT:  Septic Shock   History of Present Illness:  Patient is a 71 year old female with pertinent PMH of HTN, HLD, CAD, asthma presents to the [facility] on 5/18 with septic shock.  Patient reported having a fall on 5/18.  Patient complained of right hip pain and right shoulder pain after fall.  Also reports to have N/V/D and poor p.o. intake past 4 days. Physical exam showing BLE 4+ pitting edema and purple discoloration of the fingertips BUE.  LLE warm with erythema and some pus draining from left anterior lower leg.  RLE is cool and pale.  Patient noted to be hypotensive.  Given 1 L LR without response and was started on levo.  Patient started on Zosyn and vanc.  ED physician at [facility] had goals of care conversation with family and patient states that she would be okay with CPR but does not want to be intubated on a ventilator. On 5/19 PCCM consulted for transfer to [facility] and ICU admission.  Pertinent ED labs: Troponin 0.88 with repeat 0.7, pro BNP 8,060, BUN 54, creat 1.6, AST 1455, ALT 750, UA with many bacteria, WBC 25.2, Hgb 13.7, LA 4.4 with repeat 3.9, INR 2.16.  CT abdomen pelvis with no acute findings.  Echo pending.   Pertinent  Medical History   Past Medical History:  Diagnosis Date   Anemia    hx   Arthritis    Asthma    PFTs, February, 2011, moderate obstructive disease with response to bronchodilators, normal lung volumes, moderate reduction in diffusing capacity   Atrial septal aneurysm    Echo, 2008-not noted on 13 echo   CAD (coronary artery disease)    90% distal LAD in the past  /   nuclear, 2008, no ischemia, ejection fraction 70%   Cancer ([place]) 2002   DUCTAL CIS--S/P LUMPECTOMY, RADIATION AND 6 WEEKS OF TAMOXIFEN   D-dimer, elevated    January, 2014   Depression    Ejection fraction    EF  60%, echo, October, 2008   Elevated CPK    January, 2014   Endometriosis 1989   RIGHT TUBE   Endometriosis 1987   LEFT TUBE/OVARY W FOCAL IN-SITU ENDOMETRIAL ADENOCARCINOMA   GERD (gastroesophageal reflux disease)    occ   H/O hiatal hernia    ?   Hyperlipidemia    Hypertension    Hypothyroidism    Patient has had in the past that she does not need treatment   Kyphoscoliosis    Obstructive airway disease ([place])    Pinched nerve    lower back   Shortness of breath    Echo 2/22: EF 60-65, no RWMA, mild LVH, GR 1  DD, GLS -24%, normal RVSF, mild MR, trivial AI, borderline dilation of aortic root (39 mm)   UTI (lower urinary tract infection)      Significant Hospital Events: Including procedures, antibiotic start and stop dates in addition to other pertinent events   5/18: admitted to [facility] LLE cellulits and hypotensive; started on levo 5/19: transferring patient to [facility]  Interim History / Subjective:  Patient Aox3 NAD On 2 mcg levo w/ map 85 On 2L [place]  Objective   Weight 79.5 kg.       No intake or output data in the 24 hours ending 09/10/21 [place]  09/10/21 1908  Weight: 79.5 kg    Examination: General:   NAD HEENT: MM pink/moist; [place] in place Neuro: Aox3; MAE CV: s1s2, no m/r/g PULM:  dim clear BS bilaterally; [place] 2L GI: soft, bsx4 active; No tenderness to palpation Extremities: warm/dry, BLE edema 2+; LLE erythema and warmth appreciated; LLE drainage has stopped; BUE fingertips cyanotic   Resolved Hospital Problem list     Assessment & Plan:  Septic Shock Cellulitis of LLE UTI P: -will admit to ICU w/ continuous telemetry monitoring -start cefepime and linezolid -levo for map goal >65 -hold further IV fluids given elevated BNP -check echo -trend wbc/fever curve -trend troponin and LA -check cmp, mag -check cultures: bcx2, urine culture, and expectorated sputum -check procalcitonin -CXR  NSTEMI Elevated  BNP P: -telemetry monitoring -strict I/o's; daily weights -check EKG -trend troponin -check echo  N/V/D P: -supportive care  Elevated LFTs and INR: denies hx of alcohol use; CT abd/pelvis negative P: -trend cmp and INR -ruq [place] -avoid hepatotoxic medications  AKI P: -Trend BMP / urinary output -Replace electrolytes as indicated -Avoid nephrotoxic agents, ensure adequate renal perfusion  Fall on R side P: -imaging negative for fracture  L airspace opacity: likely due to hiatal hernia Hx of asthma:  take breo-ellipta at home and albuterol P: -wean o2 for sats >92% -Pulm toiletry: IS -will schedule dulera -prn albuterol for wheezing  Hx of MI Hx of HTN, HLD P: -Hold home antihypertensives while hypotensive -resume home ASA -no statin on home med list  Hx of depression P: -consider resuming cymbalta in am  Hx of GERD and hiatal hernia P: -PPI   Best Practice (right click and "Reselect all SmartList Selections" daily)   Diet/type: Regular consistency (see orders) DVT prophylaxis: prophylactic heparin  GI prophylaxis: PPI Lines: N/A Foley:  Yes, and it is still needed Code Status:  limited Last date of multidisciplinary goals of care discussion [5/19 spoke with patient; states she is okay with compressions, meds, and shock but does not want to be intubated on ventilator]  Labs   CBC: No results for input(s): WBC, NEUTROABS, HGB, HCT, MCV, PLT in the last 168 hours.  Basic Metabolic Panel: No results for input(s): NA, K, CL, CO2, GLUCOSE, BUN, CREATININE, CALCIUM, MG, PHOS in the last 168 hours. GFR: CrCl cannot be calculated (Patient's most recent lab result is older than the maximum 21 days allowed.). No results for input(s): PROCALCITON, WBC, LATICACIDVEN in the last 168 hours.  Liver Function Tests: No results for input(s): AST, ALT, ALKPHOS, BILITOT, PROT, ALBUMIN in the last 168 hours. No results for input(s): LIPASE, AMYLASE in the last 168  hours. No results for input(s): AMMONIA in the last 168 hours.  ABG    Component Value Date/Time   PHART 7.344 (L) 04/22/2015 1825   PCO2ART 46.2 (H) 04/22/2015 1825   PO2ART 224.0 (H) 04/22/2015 1825   HCO3 25.2 (H) 04/22/2015 1825   TCO2 27 04/22/2015 1825   ACIDBASEDEF 1.0 04/22/2015 1825   O2SAT 100.0 04/22/2015 1825     Coagulation Profile: No results for input(s): INR, PROTIME in the last 168 hours.  Cardiac Enzymes: No results for input(s): CKTOTAL, CKMB, CKMBINDEX, TROPONINI in the last 168 hours.  HbA1C: Hgb A1c MFr Bld  Date/Time Value Ref Range Status  07/19/2021 02:45 PM 6.7 (H) 4.6 - 6.5 % Final    Comment:    Glycemic Control Guidelines for People with Diabetes:Non Diabetic:  <6%Goal of Therapy: <7%Additional Action Suggested:  >8%  01/11/2021 04:03 PM 5.9 4.6 - 6.5 % Final    Comment:    Glycemic Control Guidelines for People with Diabetes:Non Diabetic:  <6%Goal of Therapy: <7%Additional Action Suggested:  >8%     CBG: No results for input(s): GLUCAP in the last 168 hours.  Review of Systems:   Review of Systems  Respiratory:  Negative for cough, shortness of breath and wheezing.   Cardiovascular:  Negative for chest pain.  Gastrointestinal:  Positive for diarrhea, nausea and vomiting.    Past Medical History:  She,  has a past medical history of Anemia, Arthritis, Asthma, Atrial septal aneurysm, CAD (coronary artery disease), Cancer ([place]) (2002), D-dimer, elevated, Depression, Ejection fraction, Elevated CPK, Endometriosis (1989), Endometriosis (1987), GERD (gastroesophageal reflux disease), H/O hiatal hernia, Hyperlipidemia, Hypertension, Hypothyroidism, Kyphoscoliosis, Obstructive airway disease ([place]), Pinched nerve, Shortness of breath, and UTI (lower urinary tract infection).   Surgical History:   Past Surgical History:  Procedure Laterality Date   ANKLE SURGERY Left    ligament   APPENDECTOMY  1987   AT [facility]   BACK SURGERY     Fusion    BREAST LUMPECTOMY  2003   radiation on right   BREAST LUMPECTOMY WITH RADIOACTIVE SEED LOCALIZATION Left 02/12/2016   Procedure: LEFT BREAST LUMPECTOMY WITH RADIOACTIVE SEED LOCALIZATION;  Surgeon: Excell Seltzer, MD;  Location: [place];  Service: General;  Laterality: Left;   CARDIOVASCULAR STRESS TEST  09/07/05   Nuclear, was negative   CATARACT EXTRACTION Bilateral 01,03   COLONOSCOPY WITH PROPOFOL N/A 05/04/2021   Procedure: COLONOSCOPY WITH PROPOFOL;  Surgeon: Gatha Mayer, MD;  [facility];  Service: Endoscopy;  Laterality: N/A;   ESOPHAGOGASTRODUODENOSCOPY (EGD) WITH PROPOFOL N/A 05/04/2021   Procedure: ESOPHAGOGASTRODUODENOSCOPY (EGD) WITH PROPOFOL;  Surgeon: Gatha Mayer, MD;  [facility];  Service: Endoscopy;  Laterality: N/A;   OOPHORECTOMY     LSO -RSO   PELVIC LAPAROSCOPY  1989   RSO, LYSIS OF ADHESIONS   POLYPECTOMY  05/04/2021   Procedure: POLYPECTOMY;  Surgeon: Gatha Mayer, MD;  [facility];  Service: Endoscopy;;   TOTAL ABDOMINAL HYSTERECTOMY  1987   LSO, APPENDECTOMY   TOTAL HIP ARTHROPLASTY Right 10/28/2013   dr Lorin Mercy   TOTAL HIP ARTHROPLASTY Right 10/28/2013   Procedure: TOTAL HIP ARTHROPLASTY ANTERIOR APPROACH;  Surgeon: Marybelle Killings, MD;  Location: [place];  Service: Orthopedics;  Laterality: Right;  Right Total Hip Arthroplasty, Direct Anterior Approach     Social History:   reports that she has never smoked. She has never used smokeless tobacco. She reports that she does not drink alcohol and does not use drugs.   Family History:  Her family history includes Asthma in her paternal uncle; Heart attack in her father and paternal uncle; Heart disease in her father and mother; Heart failure in her mother; Hypertension in her maternal grandfather and mother; Pneumonia in her mother; Stroke in her maternal grandfather.   Allergies Allergies  Allergen Reactions   Fluticasone-Salmeterol Other (See Comments)     REACTION: headache.  She is tolerating Dulera well. Other reaction(s): Other (See Comments) REACTION: headache.  She is tolerating Dulera well.   Norvasc [Amlodipine Besylate] Swelling and Other (See Comments)    edema   Tape Other (See Comments)    Prefers paper tape Other reaction(s): Other (See Comments) Prefers paper tape- "everything else rips my skin"     Home Medications  Prior to Admission medications   Medication Sig Start Date End Date  Taking? Authorizing Provider  albuterol (VENTOLIN HFA) 108 (90 Base) MCG/ACT inhaler Inhale 2 puffs into the lungs every 4 (four) hours as needed for wheezing or shortness of breath. 07/13/20   Binnie Rail, MD  aspirin EC 81 MG tablet Take 1 tablet (81 mg total) by mouth daily. Swallow whole. 06/05/20   Richardson Dopp T, PA-C  atorvastatin (LIPITOR) 20 MG tablet Take 1 tablet (20 mg total) by mouth daily. 01/11/21   Freada Bergeron, MD  bisoprolol (ZEBETA) 5 MG tablet Take 5 mg by mouth daily.    [provider]  DULoxetine (CYMBALTA) 30 MG capsule TAKE ONE CAPSULE EVERY DAY IN ADDITION TO '60MG'$  CAPSULE FOR A TOTAL OF '90MG'$  DAILY 05/31/21   Burns, Claudina Lick, MD  DULoxetine (CYMBALTA) 60 MG capsule TAKE ONE CAPSULE BY MOUTH DAILY. TAKE ALONG WITH '30MG'$  CAPSULE. TOTAL DAILY DOSE = 90 MG. 08/02/21   Binnie Rail, MD  famotidine (PEPCID) 20 MG tablet One after supper 09/01/20   Tanda Rockers, MD  fluticasone furoate-vilanterol (BREO ELLIPTA) 100-25 MCG/INH AEPB Inhale 1 puff into the lungs daily. 01/11/21   Binnie Rail, MD  gabapentin (NEURONTIN) 600 MG tablet TAKE ONE TABLET EVERY DAY AT BEDTIME 04/01/21   Bayard Hugger, NP  hydrochlorothiazide (HYDRODIURIL) 12.5 MG tablet Take 1 tablet (12.5 mg total) by mouth daily. 07/08/21   Binnie Rail, MD  HYDROcodone-acetaminophen (NORCO) 10-325 MG tablet Take 1 tablet by mouth every 8 (eight) hours as needed. 08/13/21   Bayard Hugger, NP  ipratropium-albuterol (DUONEB) 0.5-2.5 (3) MG/3ML SOLN  Take 3 mLs by nebulization every 4 (four) hours as needed (wheezing or SOB). During exacerbation 03/01/19   Binnie Rail, MD  irbesartan (AVAPRO) 150 MG tablet Take 150 mg by mouth daily.    [provider]  levothyroxine (SYNTHROID) 100 MCG tablet Take 1 tablet (100 mcg total) by mouth daily. 07/26/21   Binnie Rail, MD  pantoprazole (PROTONIX) 40 MG tablet TAKE 1 TABLET(40 MG) BY MOUTH DAILY 30 TO 60 MINUTES BEFORE FIRST MEAL OF THE DAY Patient not taking: Reported on 08/24/2021 05/28/21   Tanda Rockers, MD  Vitamin D, Ergocalciferol, (DRISDOL) 1.25 MG (50000 UNIT) CAPS capsule TAKE ONE CAPSULE BY MOUTH ONCE A WEEK 07/13/21   Binnie Rail, MD     Critical care time: 45 minutes      JD Rexene Agent [facility] 09/10/2021, 7:09 PM  Please see [email or web address] for pager details.  From 7A-7P if no response, please call [phone]. After hours, please call ELink [phone].

## 2021-09-10 NOTE — Progress Notes (Signed)
Date and time results received: 09/10/21 2329 (use smartphrase ".now" to insert current time)  Test: Troponin Critical Value: 670  Name of Provider Notified: Dr. Oletta Darter  Orders Received? Or Actions Taken?:  See orders

## 2021-09-10 NOTE — Progress Notes (Signed)
Pharmacy Antibiotic Note  Hannah Scott is a 71 y.o. female admitted on 09/10/2021 with cellulitis/sepsis.  Pharmacy has been consulted for cefepime dosing.  Pt presented to [facility] with sepsis from lower extremity cellulitis. She got vanc and zosyn there. D/w Dr Ander Slade and we will use linezolid and cefepime here instead to prevent worsening her AKI.   Scr 1.6 >>Crcl ~40 ml/min  Plan: Linezolid '600mg'$  IV q12 Cefepime 2g IV q12  Weight: 79.5 kg (175 lb 4.3 oz)  Temp (24hrs), Avg:97.6 F (36.4 C), Min:97.6 F (36.4 C), Max:97.6 F (36.4 C)  No results for input(s): WBC, CREATININE, LATICACIDVEN, VANCOTROUGH, VANCOPEAK, VANCORANDOM, GENTTROUGH, GENTPEAK, GENTRANDOM, TOBRATROUGH, TOBRAPEAK, TOBRARND, AMIKACINPEAK, AMIKACINTROU, AMIKACIN in the last 168 hours.  CrCl cannot be calculated (Patient's most recent lab result is older than the maximum 21 days allowed.).    Allergies  Allergen Reactions   Fluticasone-Salmeterol Other (See Comments)    REACTION: headache.  She is tolerating Dulera well. Other reaction(s): Other (See Comments) REACTION: headache.  She is tolerating Dulera well.   Norvasc [Amlodipine Besylate] Swelling and Other (See Comments)    edema   Tape Other (See Comments)    Prefers paper tape Other reaction(s): Other (See Comments) Prefers paper tape- "everything else rips my skin"    Antimicrobials this admission: Vanc/zosyn x1 [place] 5/19 linezolid>> 5/19 cefepime>>  Dose adjustments this admission:   Microbiology results: 5/19 urine>> 5/19 blood>>  Onnie Boer, PharmD, [place], AAHIVP, CPP Infectious Disease Pharmacist 09/10/2021 9:22 PM

## 2021-09-10 NOTE — Progress Notes (Signed)
No appropriate veins found with ultrasound on either arm for vasopressor ivWatch. RN was made aware that pt needs a central line.

## 2021-09-11 ENCOUNTER — Inpatient Hospital Stay: Payer: Self-pay

## 2021-09-11 ENCOUNTER — Inpatient Hospital Stay (HOSPITAL_COMMUNITY): Payer: Medicare Other

## 2021-09-11 DIAGNOSIS — A419 Sepsis, unspecified organism: Secondary | ICD-10-CM | POA: Diagnosis not present

## 2021-09-11 DIAGNOSIS — R6521 Severe sepsis with septic shock: Secondary | ICD-10-CM | POA: Diagnosis not present

## 2021-09-11 DIAGNOSIS — R9431 Abnormal electrocardiogram [ECG] [EKG]: Secondary | ICD-10-CM | POA: Diagnosis not present

## 2021-09-11 LAB — ECHOCARDIOGRAM COMPLETE
AR max vel: 4.11 cm2
AV Area VTI: 4.17 cm2
AV Area mean vel: 3.8 cm2
AV Mean grad: 5 mmHg
AV Peak grad: 10.8 mmHg
Ao pk vel: 1.64 m/s
Area-P 1/2: 7.99 cm2
S' Lateral: 2.5 cm
Weight: 2814.83 oz

## 2021-09-11 LAB — CBC
HCT: 37.4 % (ref 36.0–46.0)
Hemoglobin: 11.4 g/dL — ABNORMAL LOW (ref 12.0–15.0)
MCH: 23.5 pg — ABNORMAL LOW (ref 26.0–34.0)
MCHC: 30.5 g/dL (ref 30.0–36.0)
MCV: 77 fL — ABNORMAL LOW (ref 80.0–100.0)
Platelets: 123 10*3/uL — ABNORMAL LOW (ref 150–400)
RBC: 4.86 MIL/uL (ref 3.87–5.11)
RDW: 19.4 % — ABNORMAL HIGH (ref 11.5–15.5)
WBC: 11.2 10*3/uL — ABNORMAL HIGH (ref 4.0–10.5)
nRBC: 0.8 % — ABNORMAL HIGH (ref 0.0–0.2)

## 2021-09-11 LAB — TROPONIN I (HIGH SENSITIVITY)
Troponin I (High Sensitivity): 619 ng/L (ref <18)
Troponin I (High Sensitivity): 577 ng/L (ref <18)

## 2021-09-11 LAB — CBC WITH DIFFERENTIAL/PLATELET
Abs Immature Granulocytes: 0 10*3/uL (ref 0.00–0.07)
Basophils Absolute: 0 10*3/uL (ref 0.0–0.1)
Basophils Relative: 0 %
Eosinophils Absolute: 0.2 10*3/uL (ref 0.0–0.5)
Eosinophils Relative: 2 %
HCT: 42.9 % (ref 36.0–46.0)
Hemoglobin: 12.4 g/dL (ref 12.0–15.0)
Immature Granulocytes: 0 %
Lymphocytes Relative: 0 %
Lymphs Abs: 0 10*3/uL — ABNORMAL LOW (ref 0.7–4.0)
MCH: 22.9 pg — ABNORMAL LOW (ref 26.0–34.0)
MCHC: 28.9 g/dL — ABNORMAL LOW (ref 30.0–36.0)
MCV: 79.2 fL — ABNORMAL LOW (ref 80.0–100.0)
Monocytes Absolute: 0 10*3/uL — ABNORMAL LOW (ref 0.1–1.0)
Monocytes Relative: 0 %
Neutro Abs: 9.8 10*3/uL — ABNORMAL HIGH (ref 1.7–7.7)
Neutrophils Relative %: 98 %
Platelets: 156 10*3/uL (ref 150–400)
RBC: 5.42 MIL/uL — ABNORMAL HIGH (ref 3.87–5.11)
RDW: 19.7 % — ABNORMAL HIGH (ref 11.5–15.5)
Smear Review: ADEQUATE
WBC Morphology: INCREASED
WBC: 10 10*3/uL (ref 4.0–10.5)
nRBC: 0.7 % — ABNORMAL HIGH (ref 0.0–0.2)
nRBC: 4 /100 WBC — ABNORMAL HIGH

## 2021-09-11 LAB — COMPREHENSIVE METABOLIC PANEL
ALT: 644 U/L — ABNORMAL HIGH (ref 0–44)
AST: 224 U/L — ABNORMAL HIGH (ref 15–41)
Albumin: 1.9 g/dL — ABNORMAL LOW (ref 3.5–5.0)
Alkaline Phosphatase: 77 U/L (ref 38–126)
Anion gap: 5 (ref 5–15)
BUN: 32 mg/dL — ABNORMAL HIGH (ref 8–23)
CO2: 27 mmol/L (ref 22–32)
Calcium: 7.8 mg/dL — ABNORMAL LOW (ref 8.9–10.3)
Chloride: 101 mmol/L (ref 98–111)
Creatinine, Ser: 0.98 mg/dL (ref 0.44–1.00)
GFR, Estimated: >60 mL/min (ref >60)
Glucose, Bld: 215 mg/dL — ABNORMAL HIGH (ref 70–99)
Potassium: 3.3 mmol/L — ABNORMAL LOW (ref 3.5–5.1)
Sodium: 133 mmol/L — ABNORMAL LOW (ref 135–145)
Total Bilirubin: 5.7 mg/dL — ABNORMAL HIGH (ref 0.3–1.2)
Total Protein: 3.8 g/dL — ABNORMAL LOW (ref 6.5–8.1)

## 2021-09-11 LAB — GLUCOSE, CAPILLARY
Glucose-Capillary: 157 mg/dL — ABNORMAL HIGH (ref 70–99)
Glucose-Capillary: 186 mg/dL — ABNORMAL HIGH (ref 70–99)
Glucose-Capillary: 193 mg/dL — ABNORMAL HIGH (ref 70–99)
Glucose-Capillary: 41 mg/dL — CL (ref 70–99)
Glucose-Capillary: 54 mg/dL — ABNORMAL LOW (ref 70–99)
Glucose-Capillary: 60 mg/dL — ABNORMAL LOW (ref 70–99)
Glucose-Capillary: 79 mg/dL (ref 70–99)
Glucose-Capillary: 91 mg/dL (ref 70–99)
Glucose-Capillary: 94 mg/dL (ref 70–99)

## 2021-09-11 LAB — PROTIME-INR
INR: 1.8 — ABNORMAL HIGH (ref 0.8–1.2)
Prothrombin Time: 20.5 seconds — ABNORMAL HIGH (ref 11.4–15.2)

## 2021-09-11 LAB — HEPARIN LEVEL (UNFRACTIONATED)
Heparin Unfractionated: <0.1 IU/mL — ABNORMAL LOW (ref 0.30–0.70)
Heparin Unfractionated: 0.18 IU/mL — ABNORMAL LOW (ref 0.30–0.70)

## 2021-09-11 LAB — HEPATITIS PANEL, ACUTE
HCV Ab: NONREACTIVE
Hep A IgM: NONREACTIVE
Hep B C IgM: NONREACTIVE
Hepatitis B Surface Ag: NONREACTIVE

## 2021-09-11 LAB — LACTIC ACID, PLASMA: Lactic Acid, Venous: 1.6 mmol/L (ref 0.5–1.9)

## 2021-09-11 LAB — T4, FREE: Free T4: 0.59 ng/dL — ABNORMAL LOW (ref 0.61–1.12)

## 2021-09-11 LAB — BRAIN NATRIURETIC PEPTIDE: B Natriuretic Peptide: 899.2 pg/mL — ABNORMAL HIGH (ref 0.0–100.0)

## 2021-09-11 LAB — AMMONIA: Ammonia: 21 umol/L (ref 9–35)

## 2021-09-11 MED ORDER — CHLORHEXIDINE GLUCONATE 0.12 % MT SOLN
15.0000 mL | Freq: Two times a day (BID) | OROMUCOSAL | Status: DC
  Administered 2021-09-11 – 2021-09-21 (×17): 15 mL via OROMUCOSAL
  Filled 2021-09-11 (×17): qty 15

## 2021-09-11 MED ORDER — DEXTROSE 50 % IV SOLN
INTRAVENOUS | Status: AC
  Administered 2021-09-11: 25 g via INTRAVENOUS
  Filled 2021-09-11: qty 50

## 2021-09-11 MED ORDER — INSULIN ASPART 100 UNIT/ML IJ SOLN
0.0000 [IU] | INTRAMUSCULAR | Status: DC
  Administered 2021-09-11 (×3): 3 [IU] via SUBCUTANEOUS
  Administered 2021-09-12: 2 [IU] via SUBCUTANEOUS
  Administered 2021-09-13: 3 [IU] via SUBCUTANEOUS
  Administered 2021-09-14: 2 [IU] via SUBCUTANEOUS

## 2021-09-11 MED ORDER — ORAL CARE MOUTH RINSE
15.0000 mL | Freq: Two times a day (BID) | OROMUCOSAL | Status: DC
  Administered 2021-09-11 – 2021-09-20 (×16): 15 mL via OROMUCOSAL

## 2021-09-11 MED ORDER — SODIUM CHLORIDE 0.9% FLUSH: 10.0000 mL | INTRAVENOUS | Status: DC | PRN

## 2021-09-11 MED ORDER — DIPHENHYDRAMINE HCL 50 MG/ML IJ SOLN
25.0000 mg | Freq: Once | INTRAMUSCULAR | Status: AC
  Administered 2021-09-11: 25 mg via INTRAVENOUS
  Filled 2021-09-11: qty 1

## 2021-09-11 MED ORDER — MAGNESIUM SULFATE 2 GM/50ML IV SOLN
2.0000 g | Freq: Once | INTRAVENOUS | Status: AC
  Administered 2021-09-11: 2 g via INTRAVENOUS
  Filled 2021-09-11: qty 50

## 2021-09-11 MED ORDER — HYDROCODONE-ACETAMINOPHEN 5-325 MG PO TABS
1.0000 | ORAL_TABLET | Freq: Three times a day (TID) | ORAL | Status: DC | PRN
Start: 2021-09-11 — End: 2021-09-12
  Administered 2021-09-11 – 2021-09-12 (×3): 1 via ORAL
  Filled 2021-09-11 (×3): qty 1

## 2021-09-11 MED ORDER — POTASSIUM CHLORIDE 20 MEQ PO PACK
40.0000 meq | PACK | Freq: Once | ORAL | Status: AC
  Administered 2021-09-11: 40 meq via ORAL
  Filled 2021-09-11: qty 2

## 2021-09-11 MED ORDER — DEXTROSE 50 % IV SOLN: 25.0000 g | INTRAVENOUS | Status: AC

## 2021-09-11 MED ORDER — HEPARIN (PORCINE) 25000 UT/250ML-% IV SOLN
1300.0000 [IU]/h | INTRAVENOUS | Status: DC
Start: 2021-09-11 — End: 2021-09-13
  Administered 2021-09-11 (×2): 1000 [IU]/h via INTRAVENOUS
  Administered 2021-09-12 (×2): 1300 [IU]/h via INTRAVENOUS
  Filled 2021-09-11 (×3): qty 250

## 2021-09-11 MED ORDER — SODIUM CHLORIDE 0.9% FLUSH
10.0000 mL | Freq: Two times a day (BID) | INTRAVENOUS | Status: DC
  Administered 2021-09-11 (×2): 10 mL
  Administered 2021-09-12: 20 mL
  Administered 2021-09-12: 30 mL
  Administered 2021-09-13 – 2021-09-18 (×11): 10 mL
  Administered 2021-09-19: 20 mL
  Administered 2021-09-19 – 2021-09-21 (×5): 10 mL

## 2021-09-11 NOTE — Progress Notes (Signed)
ANTICOAGULATION CONSULT NOTE - Initial Consult  Pharmacy Consult for Heparin Indication: chest pain/ACS  Allergies  Allergen Reactions   Fluticasone-Salmeterol Other (See Comments)    REACTION: headache.  She is tolerating Dulera well. Other reaction(s): Other (See Comments) REACTION: headache.  She is tolerating Dulera well.   Norvasc [Amlodipine Besylate] Swelling and Other (See Comments)    edema   Tape Other (See Comments)    Prefers paper tape Other reaction(s): Other (See Comments) Prefers paper tape- "everything else rips my skin"    Patient Measurements: Weight: 79.5 kg (175 lb 4.3 oz) Heparin Dosing Weight: 65 kg  Vital Signs: Temp: 97.7 F (36.5 C) (05/19 2329) Temp Source: Oral (05/19 2329) BP: 94/75 (05/20 0100) Pulse Rate: 117 (05/20 0100)  Labs: Recent Labs    09/10/21 2232 09/11/21 0041  HGB 12.4  --   HCT 42.9  --   PLT 156  --   APTT 34  --   LABPROT 22.5*  --   INR 2.0*  --   CREATININE 1.40*  --   TROPONINIHS 670* 619*    Estimated Creatinine Clearance: 33.9 mL/min (A) (by C-G formula based on SCr of 1.4 mg/dL (H)).   Medical History: Past Medical History:  Diagnosis Date   Anemia    hx   Arthritis    Asthma    PFTs, February, 2011, moderate obstructive disease with response to bronchodilators, normal lung volumes, moderate reduction in diffusing capacity   Atrial septal aneurysm    Echo, 2008-not noted on 13 echo   CAD (coronary artery disease)    90% distal LAD in the past  /   nuclear, 2008, no ischemia, ejection fraction 70%   Cancer ([place]) 2002   DUCTAL CIS--S/P LUMPECTOMY, RADIATION AND 6 WEEKS OF TAMOXIFEN   D-dimer, elevated    January, 2014   Depression    Ejection fraction    EF 60%, echo, October, 2008   Elevated CPK    January, 2014   Endometriosis 1989   RIGHT TUBE   Endometriosis 1987   LEFT TUBE/OVARY W FOCAL IN-SITU ENDOMETRIAL ADENOCARCINOMA   GERD (gastroesophageal reflux disease)    occ   H/O hiatal  hernia    ?   Hyperlipidemia    Hypertension    Hypothyroidism    Patient has had in the past that she does not need treatment   Kyphoscoliosis    Obstructive airway disease (HCC)    Pinched nerve    lower back   Shortness of breath    Echo 2/22: EF 60-65, no RWMA, mild LVH, GR 1  DD, GLS -24%, normal RVSF, mild MR, trivial AI, borderline dilation of aortic root (39 mm)   UTI (lower urinary tract infection)     Medications:  Scheduled:   aspirin EC  81 mg Oral Daily   atorvastatin  20 mg Oral Daily   Chlorhexidine Gluconate Cloth  6 each Topical Q0600   levothyroxine  100 mcg Oral Daily   mometasone-formoterol  2 puff Inhalation BID   pantoprazole  40 mg Oral Daily    Assessment: 71 y.o. female with with elevated troponin, possible ACS, for heparin.  Heparin 5000 units SQ given at 2200  Goal of Therapy:  Heparin level 0.3-0.7 units/ml Monitor platelets by anticoagulation protocol: Yes   Plan:  Start heparin 1000 units/hr Check heparin level in 8 hours.   Hannah Scott, [place] Curb 09/11/2021,1:51 AM

## 2021-09-11 NOTE — Progress Notes (Signed)
eLink Physician-Brief Progress Note Patient Name: Hannah Scott DOB: 09-28-1950 MRN: [id number]   Date of Service  09/11/2021  HPI/Events of Note  Troponin = 670 --> 619. Troponin remains elevated.   eICU Interventions  Plan: Heparin IV infusion per pharmacy consult.  D/C Heparin [place].      Intervention Category Major Interventions: Other:  Mir Fullilove Cornelia Copa 09/11/2021, 1:44 AM

## 2021-09-11 NOTE — Progress Notes (Signed)
ANTICOAGULATION CONSULT NOTE - Follow Up Consult  Pharmacy Consult for IV Heparin Indication: chest pain/ACS  Allergies  Allergen Reactions   Fluticasone-Salmeterol Other (See Comments)    REACTION: headache.  She is tolerating Dulera well. Other reaction(s): Other (See Comments) REACTION: headache.  She is tolerating Dulera well.   Norvasc [Amlodipine Besylate] Swelling and Other (See Comments)    edema   Tape Other (See Comments)    Prefers paper tape Other reaction(s): Other (See Comments) Prefers paper tape- "everything else rips my skin"    Patient Measurements: Weight: 79.8 kg (175 lb 14.8 oz) Heparin Dosing Weight: 65 kg  Vital Signs: Temp: 98.5 F (36.9 C) (05/20 1118) Temp Source: Oral (05/20 1118) BP: 122/68 (05/20 1345) Pulse Rate: 123 (05/20 1345)  Labs: Recent Labs    09/10/21 2232 09/11/21 0041 09/11/21 1000 09/11/21 1008  HGB 12.4  --  11.4*  --   HCT 42.9  --  37.4  --   PLT 156  --  123*  --   APTT 34  --   --   --   LABPROT 22.5*  --  20.5*  --   INR 2.0*  --  1.8*  --   HEPARINUNFRC  --   --   --  <0.10*  CREATININE 1.40*  --  0.98  --   TROPONINIHS 670* 619* 577*  --     Estimated Creatinine Clearance: 48.5 mL/min (by C-G formula based on SCr of 0.98 mg/dL).  Assessment: 71 years of age female started on IV Heparin for elevated troponin and concern for ACS.   Initial Heparin level was undetectable. RN states no stopping of infusion but did change IV site from PIV to new PICC. Concern that for some leaking around site but no extravasation. No bleeding noted. Will conservatively increase and recheck. INR bumped- trending down in setting of elevated LFTs. Platelets trending down at 123.   Goal of Therapy:  Heparin level 0.3-0.7 units/ml Monitor platelets by anticoagulation protocol: Yes   Plan:  Heparin rate increased to 1150 units/hr (11.5 mL/hr). Recheck Heparin level in 8 hours.  Daily Heparin level and CBC while on therapy.   Monitor coags and platelets closely.   Sloan Leiter, PharmD, BCPS, BCCCP Clinical Pharmacist Please refer to [facility] for [place] numbers 09/11/2021,2:07 PM

## 2021-09-11 NOTE — Progress Notes (Addendum)
NAME:  Hannah Scott, MRN:  [id number], DOB:  03/24/1951, LOS: 1 ADMISSION DATE:  09/10/2021, CONSULTATION DATE:  5/19 REFERRING MD:  [facility], CHIEF COMPLAINT:  Septic Shock   History of Present Illness:  Patient is a 71 year old female with pertinent PMH of HTN, HLD, CAD, asthma presents to the [facility] on 5/18 with septic shock.  Patient reported having a fall on 5/18.  Patient complained of right hip pain and right shoulder pain after fall.  Also reports to have N/V/D and poor p.o. intake past 4 days. Physical exam showing BLE 4+ pitting edema and purple discoloration of the fingertips BUE.  LLE warm with erythema and some pus draining from left anterior lower leg.  RLE is cool and pale.  Patient noted to be hypotensive.  Given 1 L LR without response and was started on levo.  Patient started on Zosyn and vanc.  ED physician at [facility] had goals of care conversation with family and patient states that she would be okay with CPR but does not want to be intubated on a ventilator. On 5/19 PCCM consulted for transfer to [facility] and ICU admission.  Pertinent ED labs: Troponin 0.88 with repeat 0.7, pro BNP 8,060, BUN 54, creat 1.6, AST 1455, ALT 750, UA with many bacteria, WBC 25.2, Hgb 13.7, LA 4.4 with repeat 3.9, INR 2.16.  CT abdomen pelvis with no acute findings.  Echo has been completed, but not read.  Of note patient states she takes hydrocodone ( Norco) 10-325 TID, last dose 5/18 >. One dose took 3 doses last 5/17.   Pertinent  Medical History   Past Medical History:  Diagnosis Date   Anemia    hx   Arthritis    Asthma    PFTs, February, 2011, moderate obstructive disease with response to bronchodilators, normal lung volumes, moderate reduction in diffusing capacity   Atrial septal aneurysm    Echo, 2008-not noted on 13 echo   CAD (coronary artery disease)    90% distal LAD in the past  /   nuclear, 2008, no ischemia, ejection fraction 70%   Cancer ([place]) 2002    DUCTAL CIS--S/P LUMPECTOMY, RADIATION AND 6 WEEKS OF TAMOXIFEN   D-dimer, elevated    January, 2014   Depression    Ejection fraction    EF 60%, echo, October, 2008   Elevated CPK    January, 2014   Endometriosis 1989   RIGHT TUBE   Endometriosis 1987   LEFT TUBE/OVARY W FOCAL IN-SITU ENDOMETRIAL ADENOCARCINOMA   GERD (gastroesophageal reflux disease)    occ   H/O hiatal hernia    ?   Hyperlipidemia    Hypertension    Hypothyroidism    Patient has had in the past that she does not need treatment   Kyphoscoliosis    Obstructive airway disease ([place])    Pinched nerve    lower back   Shortness of breath    Echo 2/22: EF 60-65, no RWMA, mild LVH, GR 1  DD, GLS -24%, normal RVSF, mild MR, trivial AI, borderline dilation of aortic root (39 mm)   UTI (lower urinary tract infection)      Significant Hospital Events: Including procedures, antibiotic start and stop dates in addition to other pertinent events   5/18: admitted to [facility] LLE cellulits and hypotensive; started on levo 5/19: transferring patient to [facility] 5/20, unable to draw labs difficult stick>> will have assessed for PICC line placement  Interim History /  Subjective:  Patient Aox3 NAD On 4 mcg levo w/ map 79 On 2L [place] with sats of 97% AM labs >> Troponin 619 from 670 on 5/19,  WBC 11.2 ( From 10 on 5/19), HGB 11.4. Plts 123 Na 133, K 3.3 , BUN 32  AST 224 from 329 on 5/18 ALT 644 from 806 on 5/18 Total Bili 5.7 from 5.5 on 5/18 Additional Pending labs will be assessed once they result  Objective   Blood pressure 110/66, pulse (!) 107, temperature 98.2 F (36.8 C), temperature source Oral, resp. rate (!) 25, weight 79.8 kg, SpO2 97 %.        Intake/Output Summary (Last 24 hours) at 09/11/2021 0746 Last data filed at 09/11/2021 0700 Gross per 24 hour  Intake 1396.41 ml  Output 500 ml  Net 896.41 ml   Filed Weights   09/10/21 1908 09/11/21 0500  Weight: 79.5 kg 79.8 kg     Examination: General:   NAD, Alert and in no distress HEENT: MM dry, ; [place] in place, No JVD, No LAD Neuro: Aox3; MAE CV: s1s2, no m/r/g, ST per tele PULM:  Bilateral chest excursion, dim clear BS bilaterally; [place] 2L, sats 97% GI: soft, bsx4 active; No tenderness to palpation Extremities: warm/dry, BLE edema 2+; LLE erythema and warmth appreciated; drainage has stopped;RLE erythema to lesser degree than LLE , BUE fingertips cyanotic, pulses palpable   Resolved Hospital Problem list     Assessment & Plan:  Septic Shock Cellulitis of LLE UTI P: -Continue  cefepime and linezolid -titrate levo for map goal >65 -hold further IV fluids given elevated BNP -echo has been done but not read 5/20 -trend wbc/fever curve -trend troponin and LA -check cmp, mag -follow  cultures: bcx2, urine culture, and expectorated sputum -trend  procalcitonin -CXR prn - PICC line placement  NSTEMI Elevated BNP Elevated Troponins on admission  P: -telemetry monitoring -strict I/o's; daily weights -EKG prn -trend troponin -check echo  N/V/D P: -supportive care  Elevated LFTs and INR: denies hx of alcohol use; CT abd/pelvis negative P: -trend cmp and INR -ruq us>> pending to be done 5/20 am -avoid hepatotoxic medications  AKI P: -Trend BMP / urinary output -Replace electrolytes as indicated -Avoid nephrotoxic agents, ensure adequate renal perfusion  Fall on R side P: -imaging negative for fracture - Fall precautions   L airspace opacity: likely due to hiatal hernia Hx of asthma:  take breo-ellipta at home and albuterol P: -wean o2 for sats >92% -Pulm toiletry: IS -scheduled Dulera  in place of Breo -prn albuterol for wheezing  Hx of MI Hx of HTN, HLD P: -Hold home antihypertensives while hypotensive -resume home ASA -no statin on home med list  Hx of depression P: -consider resuming cymbalta in am  Hx of GERD and hiatal hernia P: -PPI  Chronic Pain Takes Norco  TID at home, last dose 5/18 Plan Will add Norco 5 mg Q 8 prn    Best Practice (right click and "Reselect all SmartList Selections" daily)   Diet/type: Regular consistency (see orders) DVT prophylaxis: prophylactic heparin  GI prophylaxis: PPI Lines: PICC requested Foley:  Yes, and it is still needed Code Status:  limited Last date of multidisciplinary goals of care discussion 5/19 spoke with patient; states she is okay with compressions, meds, and shock but does not want to be intubated on ventilator]  Labs   CBC: Recent Labs  Lab 09/10/21 2232  WBC 10.0  NEUTROABS 9.8*  HGB 12.4  HCT 42.9  MCV 79.2*  PLT 301    Basic Metabolic Panel: Recent Labs  Lab 09/10/21 2232  NA 138  K 4.0  CL 103  CO2 28  GLUCOSE 131*  BUN 42*  CREATININE 1.40*  CALCIUM 8.0*  MG 1.7  PHOS 3.6   GFR: Estimated Creatinine Clearance: 33.9 mL/min (A) (by C-G formula based on SCr of 1.4 mg/dL (H)). Recent Labs  Lab 09/10/21 2232  PROCALCITON 2.23  WBC 10.0  LATICACIDVEN 3.0*    Liver Function Tests: Recent Labs  Lab 09/10/21 2232  AST 329*  ALT 806*  ALKPHOS 82  BILITOT 5.5*  PROT 4.3*  ALBUMIN 2.1*   Recent Labs  Lab 09/10/21 2232  LIPASE 33   No results for input(s): AMMONIA in the last 168 hours.  ABG    Component Value Date/Time   PHART 7.344 (L) 04/22/2015 1825   PCO2ART 46.2 (H) 04/22/2015 1825   PO2ART 224.0 (H) 04/22/2015 1825   HCO3 25.2 (H) 04/22/2015 1825   TCO2 27 04/22/2015 1825   ACIDBASEDEF 1.0 04/22/2015 1825   O2SAT 100.0 04/22/2015 1825     Coagulation Profile: Recent Labs  Lab 09/10/21 2232  INR 2.0*    Cardiac Enzymes: No results for input(s): CKTOTAL, CKMB, CKMBINDEX, TROPONINI in the last 168 hours.  HbA1C: Hgb A1c MFr Bld  Date/Time Value Ref Range Status  07/19/2021 02:45 PM 6.7 (H) 4.6 - 6.5 % Final    Comment:    Glycemic Control Guidelines for People with Diabetes:Non Diabetic:  <6%Goal of Therapy: <7%Additional Action  Suggested:  >8%   01/11/2021 04:03 PM 5.9 4.6 - 6.5 % Final    Comment:    Glycemic Control Guidelines for People with Diabetes:Non Diabetic:  <6%Goal of Therapy: <7%Additional Action Suggested:  >8%     CBG: Recent Labs  Lab 09/10/21 2311 09/11/21 0314 09/11/21 0316 09/11/21 0727 09/11/21 0737  GLUCAP 102* 64* 91 60* 41*     Family History:  Her family history includes Asthma in her paternal uncle; Heart attack in her father and paternal uncle; Heart disease in her father and mother; Heart failure in her mother; Hypertension in her maternal grandfather and mother; Pneumonia in her mother; Stroke in her maternal grandfather.   Allergies Allergies  Allergen Reactions   Fluticasone-Salmeterol Other (See Comments)    REACTION: headache.  She is tolerating Dulera well. Other reaction(s): Other (See Comments) REACTION: headache.  She is tolerating Dulera well.   Norvasc [Amlodipine Besylate] Swelling and Other (See Comments)    edema   Tape Other (See Comments)    Prefers paper tape Other reaction(s): Other (See Comments) Prefers paper tape- "everything else rips my skin"     Home Medications  Prior to Admission medications   Medication Sig Start Date End Date Taking? Authorizing Provider  albuterol (VENTOLIN HFA) 108 (90 Base) MCG/ACT inhaler Inhale 2 puffs into the lungs every 4 (four) hours as needed for wheezing or shortness of breath. 07/13/20   Binnie Rail, MD  aspirin EC 81 MG tablet Take 1 tablet (81 mg total) by mouth daily. Swallow whole. 06/05/20   Richardson Dopp T, PA-C  atorvastatin (LIPITOR) 20 MG tablet Take 1 tablet (20 mg total) by mouth daily. 01/11/21   Freada Bergeron, MD  bisoprolol (ZEBETA) 5 MG tablet Take 5 mg by mouth daily.    [provider]  DULoxetine (CYMBALTA) 30 MG capsule TAKE ONE CAPSULE EVERY DAY IN ADDITION TO '60MG'$  CAPSULE FOR A TOTAL OF '90MG'$  DAILY  05/31/21   Binnie Rail, MD  DULoxetine (CYMBALTA) 60 MG capsule TAKE ONE  CAPSULE BY MOUTH DAILY. TAKE ALONG WITH '30MG'$  CAPSULE. TOTAL DAILY DOSE = 90 MG. 08/02/21   Binnie Rail, MD  famotidine (PEPCID) 20 MG tablet One after supper 09/01/20   Tanda Rockers, MD  fluticasone furoate-vilanterol (BREO ELLIPTA) 100-25 MCG/INH AEPB Inhale 1 puff into the lungs daily. 01/11/21   Binnie Rail, MD  gabapentin (NEURONTIN) 600 MG tablet TAKE ONE TABLET EVERY DAY AT BEDTIME 04/01/21   Bayard Hugger, NP  hydrochlorothiazide (HYDRODIURIL) 12.5 MG tablet Take 1 tablet (12.5 mg total) by mouth daily. 07/08/21   Binnie Rail, MD  HYDROcodone-acetaminophen (NORCO) 10-325 MG tablet Take 1 tablet by mouth every 8 (eight) hours as needed. 08/13/21   Bayard Hugger, NP  ipratropium-albuterol (DUONEB) 0.5-2.5 (3) MG/3ML SOLN Take 3 mLs by nebulization every 4 (four) hours as needed (wheezing or SOB). During exacerbation 03/01/19   Binnie Rail, MD  irbesartan (AVAPRO) 150 MG tablet Take 150 mg by mouth daily.    [provider]  levothyroxine (SYNTHROID) 100 MCG tablet Take 1 tablet (100 mcg total) by mouth daily. 07/26/21   Binnie Rail, MD  pantoprazole (PROTONIX) 40 MG tablet TAKE 1 TABLET(40 MG) BY MOUTH DAILY 30 TO 60 MINUTES BEFORE FIRST MEAL OF THE DAY Patient not taking: Reported on 08/24/2021 05/28/21   Tanda Rockers, MD  Vitamin D, Ergocalciferol, (DRISDOL) 1.25 MG (50000 UNIT) CAPS capsule TAKE ONE CAPSULE BY MOUTH ONCE A WEEK 07/13/21   Binnie Rail, MD     Critical care time: 47 minutes    Magdalen Spatz, MSN, AGACNP-BC [place] for personal pager PCCM on call pager [phone]  09/11/2021, 7:46 AM  Please see [email or web address] for pager details.  From 7A-7P if no response, please call [phone]. After hours, please call ELink [phone].   I agree with the Advanced Practitioner's note, impression, and recommendations as outlined. I have taken an independent interval history, reviewed the chart and examined the  patient.  My medical decision making is as follows:   Subjective:  Still on pressors tachycardic and lethargic this morning.   Objective: Vitals:   09/11/21 1145 09/11/21 1200  BP: 121/74 120/62  Pulse: (!) 107 (!) 110  Resp: (!) 22 (!) 21  Temp:    SpO2: 93% 93%   Acutely ill No respiratory distress - respirations are shallow without wheezes or crackles Mumbles and says she doesn't feel well. Follows commands Tachycardic, regular Bilateral lower extremities with erythema, tenderness and swelling    Labs/Imaging: Troponin 577 BNP 899 Na 133 K 3.3 Cr 0.98 WBC 11.2 Hgb 11.4 T bili 5.7 Lactic acid 1.6 Procal 2.23 [facility] hospital culture data - GNRs in blood   Assessment and Plan:  Septic Shock related to GNR bacteremia suspected UTI source Possible cellulitis Shock liver Type 2 NSTEMI/demand ischemia AKI Hypokalemia   Plan is to continue cefepime/linezolid for now.  Reviewed prior cultures, She has no prior culture ESBL culture data.  Follow echocardiogram results Maintain vasopressors for goal MAP>65 She had a picc placed this morning.   The patient is critically ill due to septic shock with multiple organ systems failure and requires high complexity decision making for assessment and support, frequent evaluation and titration of therapies, application of advanced monitoring technologies and extensive interpretation of multiple databases.   Critical Care Time devoted to patient care services described in  this note is 40 minutes. This time reflects time of care of this [place] . This critical care time does not reflect separately billable procedures or procedure time, teaching time and supervisory time of PA/NP/Med student/Med Resident etc but could involve care discussion time.  Leone [facility] Pulmonary and Critical Care Medicine 09/11/2021 12:18 PM  Pager: see AMION  If no response to pager, please call critical care on call (see  AMION) until 7pm After 7:00 pm call [place]

## 2021-09-11 NOTE — Progress Notes (Signed)
[facility] ADULT ICU REPLACEMENT PROTOCOL   The patient does apply for the [facility] Adult ICU Electrolyte Replacment Protocol based on the criteria listed below:   1.Exclusion criteria: TCTS patients, ECMO patients, and Dialysis patients 2. Is GFR >/= 30 ml/min? Yes.    Patient's GFR today is 40 3. Is SCr </= 2? Yes.   Patient's SCr is 1.40 mg/dL 4. Did SCr increase >/= 0.5 in 24 hours? No. 5.Pt's weight >40kg  Yes.   6. Abnormal electrolyte(s): Mag 1.7  7. Electrolytes replaced per protocol 8.  Call MD STAT for K+ </= 2.5, Phos </= 1, or Mag </= 1 Physician:  Randie Heinz 09/11/2021 12:26 AM

## 2021-09-11 NOTE — Progress Notes (Signed)
eLink Physician-Brief Progress Note Patient Name: Hannah Scott DOB: [birth year] MRN: [id number]   Date of Service  09/11/2021  HPI/Events of Note  Patient transferred to [facility] from OSH with Foley catheter in place. No order for Foley catheter.  eICU Interventions  Plan: Continue Foley catheter.      Intervention Category Major Interventions: Other:  Yer Castello Cornelia Copa 09/11/2021, 1:40 AM

## 2021-09-11 NOTE — Progress Notes (Signed)
Gate for Heparin Indication: chest pain/ACS Brief A/P: Heparin level subtherapeutic Increase Heparin rate  Allergies  Allergen Reactions   Fluticasone-Salmeterol Other (See Comments)    REACTION: headache.  She is tolerating Dulera well. Other reaction(s): Other (See Comments) REACTION: headache.  She is tolerating Dulera well.   Norvasc [Amlodipine Besylate] Swelling and Other (See Comments)    edema   Tape Other (See Comments)    Prefers paper tape Other reaction(s): Other (See Comments) Prefers paper tape- "everything else rips my skin"    Patient Measurements: Weight: 79.8 kg (175 lb 14.8 oz) Heparin Dosing Weight: 65 kg  Vital Signs: Temp: 98.3 F (36.8 C) (05/20 1943) Temp Source: Oral (05/20 1943) BP: 106/92 (05/20 2245) Pulse Rate: 109 (05/20 2245)  Labs: Recent Labs    09/10/21 2232 09/11/21 0041 09/11/21 1000 09/11/21 1008 09/11/21 2154  HGB 12.4  --  11.4*  --   --   HCT 42.9  --  37.4  --   --   PLT 156  --  123*  --   --   APTT 34  --   --   --   --   LABPROT 22.5*  --  20.5*  --   --   INR 2.0*  --  1.8*  --   --   HEPARINUNFRC  --   --   --  <0.10* 0.18*  CREATININE 1.40*  --  0.98  --   --   TROPONINIHS 670* 619* 577*  --   --      Estimated Creatinine Clearance: 48.5 mL/min (by C-G formula based on SCr of 0.98 mg/dL).  Assessment: 71 y.o. female with with elevated troponin, possible ACS, for heparin.   Goal of Therapy:  Heparin level 0.3-0.7 units/ml Monitor platelets by anticoagulation protocol: Yes   Plan:  Increase Heparin 1300 units/hr Follow-up am labs.   Caryl Pina 09/11/2021,11:05 PM

## 2021-09-11 NOTE — Progress Notes (Signed)
Echocardiogram 2D Echocardiogram has been performed.  Hannah Scott 09/11/2021, 12:26 PM

## 2021-09-11 NOTE — Plan of Care (Signed)
  Problem: Nutrition: Goal: Adequate nutrition will be maintained Outcome: Progressing   

## 2021-09-11 NOTE — Progress Notes (Signed)
Peripherally Inserted Central Catheter Placement  The IV Nurse has discussed with the patient and/or persons authorized to consent for the patient, the purpose of this procedure and the potential benefits and risks involved with this procedure.  The benefits include less needle sticks, lab draws from the catheter, and the patient may be discharged home with the catheter. Risks include, but not limited to, infection, bleeding, blood clot (thrombus formation), and puncture of an artery; nerve damage and irregular heartbeat and possibility to perform a PICC exchange if needed/ordered by physician.  Alternatives to this procedure were also discussed.  Bard Power PICC patient education guide, fact sheet on infection prevention and patient information card has been provided to patient /or left at bedside. Pt signed consent. Alert and oriented x 4.  PICC Placement Documentation  PICC Double Lumen 16/60/60 Right Basilic 39 cm 1 cm (Active)  Indication for Insertion or Continuance of Line Prolonged intravenous therapies 09/11/21 0936  Exposed Catheter (cm) 1 cm 09/11/21 0936  Site Assessment Clean, Dry, Intact 09/11/21 0936  Lumen #1 Status Flushed;Saline locked;Blood return noted 09/11/21 0936  Lumen #2 Status Flushed;Saline locked;Blood return noted 09/11/21 0936  Dressing Type Transparent;Securing device 09/11/21 0936  Dressing Status Antimicrobial disc in place 09/11/21 0936  Safety Lock Not Applicable 04/59/97 7414  Line Care Connections checked and tightened 09/11/21 0936  Line Adjustment (NICU/IV Team Only) No 09/11/21 0936  Dressing Intervention New dressing 09/11/21 0936  Dressing Change Due 09/18/21 09/11/21 [place] 09/11/2021, 9:38 AM

## 2021-09-12 DIAGNOSIS — I214 Non-ST elevation (NSTEMI) myocardial infarction: Secondary | ICD-10-CM | POA: Diagnosis not present

## 2021-09-12 DIAGNOSIS — A419 Sepsis, unspecified organism: Secondary | ICD-10-CM | POA: Diagnosis not present

## 2021-09-12 DIAGNOSIS — N179 Acute kidney failure, unspecified: Secondary | ICD-10-CM | POA: Diagnosis not present

## 2021-09-12 DIAGNOSIS — N3001 Acute cystitis with hematuria: Secondary | ICD-10-CM | POA: Diagnosis not present

## 2021-09-12 LAB — BRAIN NATRIURETIC PEPTIDE: B Natriuretic Peptide: 644.8 pg/mL — ABNORMAL HIGH (ref 0.0–100.0)

## 2021-09-12 LAB — CBC WITH DIFFERENTIAL/PLATELET
Abs Immature Granulocytes: 0.5 10*3/uL — ABNORMAL HIGH (ref 0.00–0.07)
Basophils Absolute: 0.1 10*3/uL (ref 0.0–0.1)
Basophils Relative: 1 %
Eosinophils Absolute: 0 10*3/uL (ref 0.0–0.5)
Eosinophils Relative: 0 %
HCT: 38.7 % (ref 36.0–46.0)
Hemoglobin: 11.4 g/dL — ABNORMAL LOW (ref 12.0–15.0)
Immature Granulocytes: 4 %
Lymphocytes Relative: 2 %
Lymphs Abs: 0.3 10*3/uL — ABNORMAL LOW (ref 0.7–4.0)
MCH: 23 pg — ABNORMAL LOW (ref 26.0–34.0)
MCHC: 29.5 g/dL — ABNORMAL LOW (ref 30.0–36.0)
MCV: 78.2 fL — ABNORMAL LOW (ref 80.0–100.0)
Monocytes Absolute: 0.7 10*3/uL (ref 0.1–1.0)
Monocytes Relative: 6 %
Neutro Abs: 10.8 10*3/uL — ABNORMAL HIGH (ref 1.7–7.7)
Neutrophils Relative %: 87 %
Platelets: 99 10*3/uL — ABNORMAL LOW (ref 150–400)
RBC: 4.95 MIL/uL (ref 3.87–5.11)
RDW: 19.6 % — ABNORMAL HIGH (ref 11.5–15.5)
WBC: 12.4 10*3/uL — ABNORMAL HIGH (ref 4.0–10.5)
nRBC: 1.3 % — ABNORMAL HIGH (ref 0.0–0.2)

## 2021-09-12 LAB — GLUCOSE, CAPILLARY
Glucose-Capillary: 119 mg/dL — ABNORMAL HIGH (ref 70–99)
Glucose-Capillary: 75 mg/dL (ref 70–99)
Glucose-Capillary: 95 mg/dL (ref 70–99)
Glucose-Capillary: 150 mg/dL — ABNORMAL HIGH (ref 70–99)
Glucose-Capillary: >600 mg/dL (ref 70–99)
Glucose-Capillary: 113 mg/dL — ABNORMAL HIGH (ref 70–99)
Glucose-Capillary: 81 mg/dL (ref 70–99)
Glucose-Capillary: 126 mg/dL — ABNORMAL HIGH (ref 70–99)

## 2021-09-12 LAB — PROCALCITONIN: Procalcitonin: 1.71 ng/mL

## 2021-09-12 LAB — HEPARIN LEVEL (UNFRACTIONATED): Heparin Unfractionated: 0.34 IU/mL (ref 0.30–0.70)

## 2021-09-12 LAB — PROTIME-INR
INR: 1.6 — ABNORMAL HIGH (ref 0.8–1.2)
Prothrombin Time: 18.5 seconds — ABNORMAL HIGH (ref 11.4–15.2)

## 2021-09-12 LAB — URINE CULTURE: Culture: NO GROWTH

## 2021-09-12 LAB — COMPREHENSIVE METABOLIC PANEL
ALT: 523 U/L — ABNORMAL HIGH (ref 0–44)
AST: 173 U/L — ABNORMAL HIGH (ref 15–41)
Albumin: 1.9 g/dL — ABNORMAL LOW (ref 3.5–5.0)
Alkaline Phosphatase: 102 U/L (ref 38–126)
Anion gap: 4 — ABNORMAL LOW (ref 5–15)
BUN: 24 mg/dL — ABNORMAL HIGH (ref 8–23)
CO2: 29 mmol/L (ref 22–32)
Calcium: 7.8 mg/dL — ABNORMAL LOW (ref 8.9–10.3)
Chloride: 101 mmol/L (ref 98–111)
Creatinine, Ser: 0.75 mg/dL (ref 0.44–1.00)
GFR, Estimated: >60 mL/min (ref >60)
Glucose, Bld: 125 mg/dL — ABNORMAL HIGH (ref 70–99)
Potassium: 3.8 mmol/L (ref 3.5–5.1)
Sodium: 134 mmol/L — ABNORMAL LOW (ref 135–145)
Total Bilirubin: 6.2 mg/dL — ABNORMAL HIGH (ref 0.3–1.2)
Total Protein: 3.9 g/dL — ABNORMAL LOW (ref 6.5–8.1)

## 2021-09-12 LAB — LACTIC ACID, PLASMA: Lactic Acid, Venous: 0.9 mmol/L (ref 0.5–1.9)

## 2021-09-12 LAB — MAGNESIUM: Magnesium: 2 mg/dL (ref 1.7–2.4)

## 2021-09-12 LAB — TROPONIN I (HIGH SENSITIVITY)
Troponin I (High Sensitivity): 465 ng/L (ref <18)
Troponin I (High Sensitivity): 460 ng/L (ref <18)

## 2021-09-12 MED ORDER — HYDROCODONE-ACETAMINOPHEN 5-325 MG PO TABS
1.0000 | ORAL_TABLET | Freq: Two times a day (BID) | ORAL | Status: DC | PRN
  Administered 2021-09-13 – 2021-09-14 (×2): 1 via ORAL
  Filled 2021-09-12 (×2): qty 1

## 2021-09-12 MED ORDER — DEXTROSE IN LACTATED RINGERS 5 % IV SOLN: INTRAVENOUS | Status: AC

## 2021-09-12 MED ORDER — PANTOPRAZOLE SODIUM 40 MG IV SOLR
40.0000 mg | Freq: Every day | INTRAVENOUS | Status: DC
  Administered 2021-09-12 – 2021-09-14 (×2): 40 mg via INTRAVENOUS
  Filled 2021-09-12 (×2): qty 10

## 2021-09-12 NOTE — Progress Notes (Signed)
Called patients daughter in law and informed her that patient was being transferred to [id number]. All questions answered.

## 2021-09-12 NOTE — Progress Notes (Addendum)
NAME:  Hannah Scott, MRN:  [id number], DOB:  10/10/1950, LOS: 2 ADMISSION DATE:  09/10/2021, CONSULTATION DATE:  5/19 REFERRING MD:  [facility], CHIEF COMPLAINT:  Septic Shock   History of Present Illness:  Patient is a 71 year old female with pertinent PMH of HTN, HLD, CAD, asthma presents to the [facility] on 5/18 with septic shock.  Patient reported having a fall on 5/18.  Patient complained of right hip pain and right shoulder pain after fall.  Also reports to have N/V/D and poor p.o. intake past 4 days. Physical exam showing BLE 4+ pitting edema and purple discoloration of the fingertips BUE.  LLE warm with erythema and some pus draining from left anterior lower leg.  RLE is cool and pale.  Patient noted to be hypotensive.  Given 1 L LR without response and was started on levo.  Patient started on Zosyn and vanc.  ED physician at [facility] had goals of care conversation with family and patient states that she would be okay with CPR but does not want to be intubated on a ventilator. On 5/19 PCCM consulted for transfer to [facility] and ICU admission.  Pertinent ED labs: Troponin 0.88 with repeat 0.7, pro BNP 8,060, BUN 54, creat 1.6, AST 1455, ALT 750, UA with many bacteria, WBC 25.2, Hgb 13.7, LA 4.4 with repeat 3.9, INR 2.16.  CT abdomen pelvis with no acute findings.  Echo has been completed, but not read.  Of note patient states she takes hydrocodone ( Norco) 10-325 TID, last dose 5/18 >. One dose took 3 doses last 5/17.   Pertinent  Medical History   Past Medical History:  Diagnosis Date   Anemia    hx   Arthritis    Asthma    PFTs, February, 2011, moderate obstructive disease with response to bronchodilators, normal lung volumes, moderate reduction in diffusing capacity   Atrial septal aneurysm    Echo, 2008-not noted on 13 echo   CAD (coronary artery disease)    90% distal LAD in the past  /   nuclear, 2008, no ischemia, ejection fraction 70%   Cancer ([place]) 2002    DUCTAL CIS--S/P LUMPECTOMY, RADIATION AND 6 WEEKS OF TAMOXIFEN   D-dimer, elevated    January, 2014   Depression    Ejection fraction    EF 60%, echo, October, 2008   Elevated CPK    January, 2014   Endometriosis 1989   RIGHT TUBE   Endometriosis 1987   LEFT TUBE/OVARY W FOCAL IN-SITU ENDOMETRIAL ADENOCARCINOMA   GERD (gastroesophageal reflux disease)    occ   H/O hiatal hernia    ?   Hyperlipidemia    Hypertension    Hypothyroidism    Patient has had in the past that she does not need treatment   Kyphoscoliosis    Obstructive airway disease ([place])    Pinched nerve    lower back   Shortness of breath    Echo 2/22: EF 60-65, no RWMA, mild LVH, GR 1  DD, GLS -24%, normal RVSF, mild MR, trivial AI, borderline dilation of aortic root (39 mm)   UTI (lower urinary tract infection)      Significant Hospital Events: Including procedures, antibiotic start and stop dates in addition to other pertinent events   5/18: admitted to [facility] LLE cellulits and hypotensive; started on levo 5/19: transferring patient to [facility] 5/20, PICC placed, GNR bacteremia suspected UTI source Off Levo, groggy and concern for patient is aspirating on  her food ( Coughs with po's), Total Bili is rising  Echo 09/11/21 Left ventricular ejection fraction, by estimation, is >75%. The left ventricle has hyperdynamic function. The left ventricle has no regional wall motion abnormalities. RV difficult to image In some views, appears dilated with depressed function Would consider limited echo with Definity to visualize RV better and define it's function. Right ventricular systolic function is low normal. The right ventricular size is normal. Left atrial size was small. Right atrial size was severely dilated. The mitral valve is normal in structure. Trivial mitral valve regurgitation. The aortic valve is tricuspid. Aortic valve regurgitation is not visualized. The inferior vena cava is normal in size  with greater than 50% respiratory variability, suggesting right atrial pressure of 3 mmHg.  [place] RUQ 09/11/21 No evidence of cholelithiasis or biliary ductal dilatation. Unremarkable sonographic appearance of the liver.  Interim History / Subjective:  Patient A&O x 2-3>> Sleepy this morning NAD Off pressors On 2L [place] with sats of 97% AM labs >> Troponin 465 from 577 on 5/20, BNP 644 ( 899 on 5/20) WBC 12.4 from 11.2  on 5/20,  HGB 11.4. Plts  99 from 123 on 5/20 Na 134, K 3.8 , BUN 24, Creatinine 0.75  AST  173 from 224 on  5/20 ALT 523  from 644 on 5/20 Total Bili  6.2, up from 5.7 on 5/20 PCT 1.71 from 2.23 on 5/20 Lactate 0.9 Net + 1.9 L ( 625 UO)  Objective   Blood pressure 114/65, pulse (!) 101, temperature 97.6 F (36.4 C), temperature source Oral, resp. rate (!) 7, weight 80.3 kg, SpO2 100 %.        Intake/Output Summary (Last 24 hours) at 09/12/2021 0727 Last data filed at 09/12/2021 0600 Gross per 24 hour  Intake 1670.8 ml  Output 625 ml  Net 1045.8 ml   Filed Weights   09/10/21 1908 09/11/21 0500 09/12/21 0600  Weight: 79.5 kg 79.8 kg 80.3 kg    Examination: General:   NAD, Alert and in no distress, sleepy HEENT: MM dry, PERRLA,  [place] in place, No JVD, No LAD Neuro: Sleepy. Alert and oriented x 2-3, MAE x 4 CV: s1s2, no m/r/g, ST per tele, heparin gtt infusing PULM:  Bilateral chest excursion, dim per bases,  clear BS bilaterally; [place] 2L, sats 100 % GI: soft, bsx4 active; No tenderness to palpation Extremities: warm/dry, BLE edema 2+; LLE erythema and warmth appreciated; drainage has stopped; RLE erythema to lesser degree than LLE , BUE fingertips cyanotic, pulses palpable   Resolved Hospital Problem list     Assessment & Plan:  Septic Shock Cellulitis of LLE UTI with positive BC for 2 of 4 bottles positive for gram negative rods ( Called from [facility] 5/20 afternoon) Off Levophed 5/20 at 2300 P: - Continue  cefepime and linezolid - levo for map  goal >65 - minimize IVF as BNP elevated - echo results as noted above, consider limited echo with Definity to visualize RV better - trend wbc/fever curve - trend troponin and LA - trend  cmp, mag - follow  cultures: bcx2, urine culture, and expectorated sputum - CXR prn - PICC line placement  NSTEMI Elevated BNP Elevated Troponins on admission  P: -Continue heparin gtt per pharmacy -telemetry monitoring -strict I/o's; daily weights -EKG prn -trend troponin - Echo as above - EKG prn - Consider Cards consult   N/V/D P: -supportive care  Elevated LFTs and INR: denies hx of alcohol use; CT abd/pelvis negative  Total Bili continues to rise Some jaundice noticed per sclera bilaterally on 5/21 P: -trend cmp and INR - Monitor for worsening jaundice -ruq us>> As noted above, no acute issues -avoid hepatotoxic medications  AKI>> improving P: -Trend BMP / urinary output -Replace electrolytes as indicated -Avoid nephrotoxic agents, ensure adequate renal perfusion  Fall on R side P: -imaging negative for fracture - Fall precautions   L airspace opacity: likely due to hiatal hernia Hx of asthma:  take breo-ellipta at home and albuterol Atelectasis on CXR New cough>> ? aspirating P: -wean o2 for sats >92% -Pulm toiletry: IS -scheduled Dulera  in place of Breo -prn albuterol for wheezing - Aggressive Pulmonary Toilet  - IS Q 1 while awake  - Flutter valve 4 blows Q 1 while awake - Mobilize, OOB to chair  - Swallow Eval by speech>> NPO until eval completed  Hx of MI Hx of HTN, HLD P: -Hold home antihypertensives while hypotensive -resume home ASA -no statin on home med list  Hx of depression P: -consider resuming cymbalta in am  Hx of GERD and hiatal hernia P: -PPI  Chronic Pain Takes Norco TID 10/325 at home, last dose 5/18 Sleepy overnight , will decrease frequency of dosing   Plan Will decrease  Norco 5 mg to  Q 12 prn Hold if groggy    Thrombocytopenia Platelets to 99 K  5/21 Doubt HIT, think just 2/2 Liver function issues Plan [place] Platelets Transfuse platelets as needed Monitor for bleeding   Best Practice (right click and "Reselect all SmartList Selections" daily)   Diet/type: Regular consistency (see orders) DVT prophylaxis: prophylactic heparin  GI prophylaxis: PPI Lines: PICC Right PICC in place Foley:  Yes, and it is still needed Code Status:  limited Last date of multidisciplinary goals of care discussion 5/19 spoke with patient; states she is okay with compressions, meds, and shock but does not want to be intubated on ventilator]  Labs   CBC: Recent Labs  Lab 09/10/21 2232 09/11/21 1000 09/12/21 0443  WBC 10.0 11.2* 12.4*  NEUTROABS 9.8*  --  10.8*  HGB 12.4 11.4* 11.4*  HCT 42.9 37.4 38.7  MCV 79.2* 77.0* 78.2*  PLT 156 123* 99*    Basic Metabolic Panel: Recent Labs  Lab 09/10/21 2232 09/11/21 1000 09/12/21 0443  NA 138 133* 134*  K 4.0 3.3* 3.8  CL 103 101 101  CO2 '28 27 29  '$ GLUCOSE 131* 215* 125*  BUN 42* 32* 24*  CREATININE 1.40* 0.98 0.75  CALCIUM 8.0* 7.8* 7.8*  MG 1.7  --   --   PHOS 3.6  --   --    GFR: Estimated Creatinine Clearance: 59.6 mL/min (by C-G formula based on SCr of 0.75 mg/dL). Recent Labs  Lab 09/10/21 2232 09/11/21 1000 09/11/21 1008 09/12/21 0443  PROCALCITON 2.23  --   --  1.71  WBC 10.0 11.2*  --  12.4*  LATICACIDVEN 3.0*  --  1.6 0.9    Liver Function Tests: Recent Labs  Lab 09/10/21 2232 09/11/21 1000 09/12/21 0443  AST 329* 224* 173*  ALT 806* 644* 523*  ALKPHOS 82 77 102  BILITOT 5.5* 5.7* 6.2*  PROT 4.3* 3.8* 3.9*  ALBUMIN 2.1* 1.9* 1.9*   Recent Labs  Lab 09/10/21 2232  LIPASE 33   Recent Labs  Lab 09/11/21 1008  AMMONIA 21    ABG    Component Value Date/Time   PHART 7.344 (L) 04/22/2015 1825   PCO2ART 46.2 (H) 04/22/2015 1825  PO2ART 224.0 (H) 04/22/2015 1825   HCO3 25.2 (H) 04/22/2015 1825   TCO2 27  04/22/2015 1825   ACIDBASEDEF 1.0 04/22/2015 1825   O2SAT 100.0 04/22/2015 1825     Coagulation Profile: Recent Labs  Lab 09/10/21 2232 09/11/21 1000  INR 2.0* 1.8*    Cardiac Enzymes: No results for input(s): CKTOTAL, CKMB, CKMBINDEX, TROPONINI in the last 168 hours.  HbA1C: Hgb A1c MFr Bld  Date/Time Value Ref Range Status  07/19/2021 02:45 PM 6.7 (H) 4.6 - 6.5 % Final    Comment:    Glycemic Control Guidelines for People with Diabetes:Non Diabetic:  <6%Goal of Therapy: <7%Additional Action Suggested:  >8%   01/11/2021 04:03 PM 5.9 4.6 - 6.5 % Final    Comment:    Glycemic Control Guidelines for People with Diabetes:Non Diabetic:  <6%Goal of Therapy: <7%Additional Action Suggested:  >8%     CBG: Recent Labs  Lab 09/11/21 1118 09/11/21 1550 09/11/21 1940 09/11/21 2355 09/12/21 0335  GLUCAP 193* 157* 94 186* 119*     Family History:  Her family history includes Asthma in her paternal uncle; Heart attack in her father and paternal uncle; Heart disease in her father and mother; Heart failure in her mother; Hypertension in her maternal grandfather and mother; Pneumonia in her mother; Stroke in her maternal grandfather.   Allergies Allergies  Allergen Reactions   Fluticasone-Salmeterol Other (See Comments)    REACTION: headache.  She is tolerating Dulera well. Other reaction(s): Other (See Comments) REACTION: headache.  She is tolerating Dulera well.   Norvasc [Amlodipine Besylate] Swelling and Other (See Comments)    edema   Tape Other (See Comments)    Prefers paper tape Other reaction(s): Other (See Comments) Prefers paper tape- "everything else rips my skin"     Home Medications  Prior to Admission medications   Medication Sig Start Date End Date Taking? Authorizing Provider  albuterol (VENTOLIN HFA) 108 (90 Base) MCG/ACT inhaler Inhale 2 puffs into the lungs every 4 (four) hours as needed for wheezing or shortness of breath. 07/13/20   Binnie Rail, MD   aspirin EC 81 MG tablet Take 1 tablet (81 mg total) by mouth daily. Swallow whole. 06/05/20   Richardson Dopp T, PA-C  atorvastatin (LIPITOR) 20 MG tablet Take 1 tablet (20 mg total) by mouth daily. 01/11/21   Freada Bergeron, MD  bisoprolol (ZEBETA) 5 MG tablet Take 5 mg by mouth daily.    [provider]  DULoxetine (CYMBALTA) 30 MG capsule TAKE ONE CAPSULE EVERY DAY IN ADDITION TO '60MG'$  CAPSULE FOR A TOTAL OF '90MG'$  DAILY 05/31/21   Burns, Claudina Lick, MD  DULoxetine (CYMBALTA) 60 MG capsule TAKE ONE CAPSULE BY MOUTH DAILY. TAKE ALONG WITH '30MG'$  CAPSULE. TOTAL DAILY DOSE = 90 MG. 08/02/21   Binnie Rail, MD  famotidine (PEPCID) 20 MG tablet One after supper 09/01/20   Tanda Rockers, MD  fluticasone furoate-vilanterol (BREO ELLIPTA) 100-25 MCG/INH AEPB Inhale 1 puff into the lungs daily. 01/11/21   Binnie Rail, MD  gabapentin (NEURONTIN) 600 MG tablet TAKE ONE TABLET EVERY DAY AT BEDTIME 04/01/21   Bayard Hugger, NP  hydrochlorothiazide (HYDRODIURIL) 12.5 MG tablet Take 1 tablet (12.5 mg total) by mouth daily. 07/08/21   Binnie Rail, MD  HYDROcodone-acetaminophen (NORCO) 10-325 MG tablet Take 1 tablet by mouth every 8 (eight) hours as needed. 08/13/21   Bayard Hugger, NP  ipratropium-albuterol (DUONEB) 0.5-2.5 (3) MG/3ML SOLN Take 3 mLs by nebulization every  4 (four) hours as needed (wheezing or SOB). During exacerbation 03/01/19   Binnie Rail, MD  irbesartan (AVAPRO) 150 MG tablet Take 150 mg by mouth daily.    [provider]  levothyroxine (SYNTHROID) 100 MCG tablet Take 1 tablet (100 mcg total) by mouth daily. 07/26/21   Binnie Rail, MD  pantoprazole (PROTONIX) 40 MG tablet TAKE 1 TABLET(40 MG) BY MOUTH DAILY 30 TO 60 MINUTES BEFORE FIRST MEAL OF THE DAY Patient not taking: Reported on 08/24/2021 05/28/21   Tanda Rockers, MD  Vitamin D, Ergocalciferol, (DRISDOL) 1.25 MG (50000 UNIT) CAPS capsule TAKE ONE CAPSULE BY MOUTH ONCE A WEEK 07/13/21   Binnie Rail, MD      Critical care time: 40 minutes    Magdalen Spatz, MSN, AGACNP-BC [place] for personal pager PCCM on call pager [phone]  09/12/2021, 7:27 AM  Please see [email or web address] for pager details.  From 7A-7P if no response, please call [phone]. After hours, please call ELink [phone].   I

## 2021-09-12 NOTE — Progress Notes (Addendum)
eLink Physician-Brief Progress Note Patient Name: Hannah Scott DOB: 12/14/1950 MRN: [id number]   Date of Service  09/12/2021  HPI/Events of Note  Patient is NPO and her blood sugar is trending down, last blood sugar was 81 mg / dl.  eICU Interventions  D 5 % LR gtt started at 50 ml / hour.        Kerry Kass Sola [place] 09/12/2021, 8:20 PM

## 2021-09-12 NOTE — Progress Notes (Signed)
ANTICOAGULATION CONSULT NOTE - Follow Up Consult  Pharmacy Consult for IV Heparin Indication: chest pain/ACS  Allergies  Allergen Reactions   Fluticasone-Salmeterol Other (See Comments)    REACTION: headache.  She is tolerating Dulera well. Other reaction(s): Other (See Comments) REACTION: headache.  She is tolerating Dulera well.   Norvasc [Amlodipine Besylate] Swelling and Other (See Comments)    edema   Tape Other (See Comments)    Prefers paper tape Other reaction(s): Other (See Comments) Prefers paper tape- "everything else rips my skin"    Patient Measurements: Weight: 80.3 kg (177 lb 0.5 oz) Heparin Dosing Weight: 65 kg  Vital Signs: Temp: 97.9 F (36.6 C) (05/21 0736) Temp Source: Axillary (05/21 0736) BP: 114/65 (05/21 0700) Pulse Rate: 101 (05/21 0700)  Labs: Recent Labs    09/10/21 2232 09/11/21 0041 09/11/21 1000 09/11/21 1008 09/11/21 2154 09/12/21 0443 09/12/21 0822  HGB 12.4  --  11.4*  --   --  11.4*  --   HCT 42.9  --  37.4  --   --  38.7  --   PLT 156  --  123*  --   --  99*  --   APTT 34  --   --   --   --   --   --   LABPROT 22.5*  --  20.5*  --   --   --  18.5*  INR 2.0*  --  1.8*  --   --   --  1.6*  HEPARINUNFRC  --   --   --  <0.10* 0.18*  --  0.34  CREATININE 1.40*  --  0.98  --   --  0.75  --   TROPONINIHS 670*   < > 577*  --   --  465* 460*   < > = values in this interval not displayed.    Estimated Creatinine Clearance: 59.6 mL/min (by C-G formula based on SCr of 0.75 mg/dL).  Assessment: 71 years of age female started on IV Heparin for elevated troponin and concern for ACS.   Heparin level is therapeutic after increase to 1300 units/hr. No bleeding noted. Will conservatively increase and recheck. INR trending down in setting of down trending but elevated LFTs. Platelets trending down at 99 (no recent heparin in last 100 days, d2 of therapy; suspect related to liver and/or sepsis)  Goal of Therapy:  Heparin level 0.3-0.7  units/ml Monitor platelets by anticoagulation protocol: Yes   Plan:  Continue Heparin rate at1300 units/hr (63m/hr). Recheck Heparin level in 8 hours.  Daily Heparin level and CBC while on therapy.  Monitor coags and platelets closely.   JSloan Leiter PharmD, BCPS, BCCCP Clinical Pharmacist Please refer to AExecutive Woods Ambulatory Surgery Center LLCfor [place]numbers 09/12/2021,10:51 AM

## 2021-09-12 NOTE — Evaluation (Signed)
Clinical/Bedside Swallow Evaluation Patient Details  Name: Hannah Scott MRN: [id number] Date of Birth: 23-Jul-1950  Today's Date: 09/12/2021 Time: SLP Start Time (ACUTE ONLY): 4098 SLP Stop Time (ACUTE ONLY): 1191 SLP Time Calculation (min) (ACUTE ONLY): 23 min  Past Medical History:  Past Medical History:  Diagnosis Date   Anemia    hx   Arthritis    Asthma    PFTs, February, 2011, moderate obstructive disease with response to bronchodilators, normal lung volumes, moderate reduction in diffusing capacity   Atrial septal aneurysm    Echo, 2008-not noted on 13 echo   CAD (coronary artery disease)    90% distal LAD in the past  /   nuclear, 2008, no ischemia, ejection fraction 70%   Cancer ([place]) 2002   DUCTAL CIS--S/P LUMPECTOMY, RADIATION AND 6 WEEKS OF TAMOXIFEN   D-dimer, elevated    January, 2014   Depression    Ejection fraction    EF 60%, echo, October, 2008   Elevated CPK    January, 2014   Endometriosis 1989   RIGHT TUBE   Endometriosis 1987   LEFT TUBE/OVARY W FOCAL IN-SITU ENDOMETRIAL ADENOCARCINOMA   GERD (gastroesophageal reflux disease)    occ   H/O hiatal hernia    ?   Hyperlipidemia    Hypertension    Hypothyroidism    Patient has had in the past that she does not need treatment   Kyphoscoliosis    Obstructive airway disease ([place])    Pinched nerve    lower back   Shortness of breath    Echo 2/22: EF 60-65, no RWMA, mild LVH, GR 1  DD, GLS -24%, normal RVSF, mild MR, trivial AI, borderline dilation of aortic root (39 mm)   UTI (lower urinary tract infection)    Past Surgical History:  Past Surgical History:  Procedure Laterality Date   ANKLE SURGERY Left    ligament   APPENDECTOMY  1987   AT [facility]   BACK SURGERY     Fusion   BREAST LUMPECTOMY  2003   radiation on right   BREAST LUMPECTOMY WITH RADIOACTIVE SEED LOCALIZATION Left 02/12/2016   Procedure: LEFT BREAST LUMPECTOMY WITH RADIOACTIVE SEED LOCALIZATION;  Surgeon: Excell Seltzer, MD;   Location: [place];  Service: General;  Laterality: Left;   CARDIOVASCULAR STRESS TEST  09/07/05   Nuclear, was negative   CATARACT EXTRACTION Bilateral 01,03   COLONOSCOPY WITH PROPOFOL N/A 05/04/2021   Procedure: COLONOSCOPY WITH PROPOFOL;  Surgeon: Gatha Mayer, MD;  [facility];  Service: Endoscopy;  Laterality: N/A;   ESOPHAGOGASTRODUODENOSCOPY (EGD) WITH PROPOFOL N/A 05/04/2021   Procedure: ESOPHAGOGASTRODUODENOSCOPY (EGD) WITH PROPOFOL;  Surgeon: Gatha Mayer, MD;  [facility];  Service: Endoscopy;  Laterality: N/A;   OOPHORECTOMY     LSO -RSO   PELVIC LAPAROSCOPY  1989   RSO, LYSIS OF ADHESIONS   POLYPECTOMY  05/04/2021   Procedure: POLYPECTOMY;  Surgeon: Gatha Mayer, MD;  [facility];  Service: Endoscopy;;   TOTAL ABDOMINAL HYSTERECTOMY  1987   LSO, APPENDECTOMY   TOTAL HIP ARTHROPLASTY Right 10/28/2013   dr Lorin [facility]   TOTAL HIP ARTHROPLASTY Right 10/28/2013   Procedure: TOTAL HIP ARTHROPLASTY ANTERIOR APPROACH;  Surgeon: Marybelle Killings, MD;  Location: [place];  Service: Orthopedics;  Laterality: Right;  Right Total Hip Arthroplasty, Direct Anterior Approach   HPI:  Patient is a 71 year old female with pertinent PMH of GERD, HTN, HLD, CAD, asthma presents to the [facility]  on 5/18 with septic shock, cellulitis of LLE, UTI and transferred to [facility]. Patient fell on 5/18 c/o of right hip pain and right shoulde, N/V/D and poor p.o. intake past 4 days. Coughing with night nurse with thin.    Assessment / Plan / Recommendation  Clinical Impression  Pt and family report she is being followed at [facility] for her hoarse vocal quality and could not recall diagnosis but description resembled a possible paralyzed vocal cord (work up is ongoing). Her vocal intensity is adequate with a hoarse quality and fair-good cough. Immediate and consistent throat clearing appreciated after sips thin for possible penetration or aspiration not observed with  applesauce.Pt coughing with RN last night and family reports difficulty with thin. Recommend objective evaluation with FEES and allow pt ice or sips water only until assessment. Family in agreement with plan. SLP Visit Diagnosis: Dysphagia, unspecified (R13.10)    Aspiration Risk  Moderate aspiration risk    Diet Recommendation Thin liquid;Ice chips PRN after oral care;Other (Comment) (thin water only or ice)   Liquid Administration via: Straw Medication Administration:  (IV)    Other  Recommendations Oral Care Recommendations: Oral care QID    Recommendations for follow up therapy are one component of a multi-disciplinary discharge planning process, led by the attending physician.  Recommendations may be updated based on patient status, additional functional criteria and insurance authorization.  Follow up Recommendations        Assistance Recommended at Discharge    Functional Status Assessment    Frequency and Duration            Prognosis Prognosis for Safe Diet Advancement: Good      Swallow Study   General Date of Onset: 09/10/21 HPI: Patient is a 71 year old female with pertinent PMH of GERD, HTN, HLD, CAD, asthma presents to the [facility] on 5/18 with septic shock, cellulitis of LLE, UTI and transferred to [facility] Patient fell on 5/18 c/o of right hip pain and right shoulde, N/V/D and poor p.o. intake past 4 days. Coughing with night nurse with thin. Type of Study: Bedside Swallow Evaluation Previous Swallow Assessment:  (none) Diet Prior to this Study: NPO Temperature Spikes Noted: No Respiratory Status: Nasal cannula History of Recent Intubation: No Behavior/Cognition: Lethargic/Drowsy;Cooperative;Requires cueing Oral Cavity Assessment: Dry Oral Care Completed by SLP: No Oral Cavity - Dentition: Adequate natural dentition Vision: Functional for self-feeding Self-Feeding Abilities: Needs assist Patient Positioning: Upright in chair Baseline Vocal Quality:  Hoarse Volitional Cough: Weak Volitional Swallow: Able to elicit    Oral/Motor/Sensory Function Overall Oral Motor/Sensory Function: Within functional limits   Ice Chips Ice chips: Not tested   Thin Liquid Thin Liquid: Impaired Presentation: Cup;Straw Oral Phase Impairments:  (none) Oral Phase Functional Implications:  (none) Pharyngeal  Phase Impairments: Throat Clearing - Immediate    Nectar Thick Nectar Thick Liquid: Not tested   Honey Thick Honey Thick Liquid: Not tested   Puree Puree: Within functional limits   Solid     Solid: Not tested      [place] Siren 09/12/2021,4:44 PM

## 2021-09-13 ENCOUNTER — Inpatient Hospital Stay (HOSPITAL_COMMUNITY): Payer: Medicare Other

## 2021-09-13 DIAGNOSIS — R6521 Severe sepsis with septic shock: Secondary | ICD-10-CM | POA: Diagnosis not present

## 2021-09-13 DIAGNOSIS — A419 Sepsis, unspecified organism: Principal | ICD-10-CM

## 2021-09-13 LAB — CBC WITH DIFFERENTIAL/PLATELET
Abs Immature Granulocytes: 0.23 10*3/uL — ABNORMAL HIGH (ref 0.00–0.07)
Basophils Absolute: 0 10*3/uL (ref 0.0–0.1)
Basophils Relative: 0 %
Eosinophils Absolute: 0.1 10*3/uL (ref 0.0–0.5)
Eosinophils Relative: 2 %
HCT: 35.9 % — ABNORMAL LOW (ref 36.0–46.0)
Hemoglobin: 10.8 g/dL — ABNORMAL LOW (ref 12.0–15.0)
Immature Granulocytes: 3 %
Lymphocytes Relative: 7 %
Lymphs Abs: 0.5 10*3/uL — ABNORMAL LOW (ref 0.7–4.0)
MCH: 23 pg — ABNORMAL LOW (ref 26.0–34.0)
MCHC: 30.1 g/dL (ref 30.0–36.0)
MCV: 76.4 fL — ABNORMAL LOW (ref 80.0–100.0)
Monocytes Absolute: 0.6 10*3/uL (ref 0.1–1.0)
Monocytes Relative: 9 %
Neutro Abs: 5.7 10*3/uL (ref 1.7–7.7)
Neutrophils Relative %: 79 %
Platelets: 86 10*3/uL — ABNORMAL LOW (ref 150–400)
RBC: 4.7 MIL/uL (ref 3.87–5.11)
RDW: 19.9 % — ABNORMAL HIGH (ref 11.5–15.5)
WBC: 7.2 10*3/uL (ref 4.0–10.5)
nRBC: 0.7 % — ABNORMAL HIGH (ref 0.0–0.2)

## 2021-09-13 LAB — COMPREHENSIVE METABOLIC PANEL
ALT: 420 U/L — ABNORMAL HIGH (ref 0–44)
AST: 151 U/L — ABNORMAL HIGH (ref 15–41)
Albumin: 1.8 g/dL — ABNORMAL LOW (ref 3.5–5.0)
Alkaline Phosphatase: 109 U/L (ref 38–126)
Anion gap: 4 — ABNORMAL LOW (ref 5–15)
BUN: 17 mg/dL (ref 8–23)
CO2: 30 mmol/L (ref 22–32)
Calcium: 7.9 mg/dL — ABNORMAL LOW (ref 8.9–10.3)
Chloride: 102 mmol/L (ref 98–111)
Creatinine, Ser: 0.7 mg/dL (ref 0.44–1.00)
GFR, Estimated: >60 mL/min (ref >60)
Glucose, Bld: 91 mg/dL (ref 70–99)
Potassium: 3.9 mmol/L (ref 3.5–5.1)
Sodium: 136 mmol/L (ref 135–145)
Total Bilirubin: 6.4 mg/dL — ABNORMAL HIGH (ref 0.3–1.2)
Total Protein: 3.9 g/dL — ABNORMAL LOW (ref 6.5–8.1)

## 2021-09-13 LAB — GLUCOSE, CAPILLARY
Glucose-Capillary: 101 mg/dL — ABNORMAL HIGH (ref 70–99)
Glucose-Capillary: 92 mg/dL (ref 70–99)
Glucose-Capillary: 108 mg/dL — ABNORMAL HIGH (ref 70–99)
Glucose-Capillary: 151 mg/dL — ABNORMAL HIGH (ref 70–99)
Glucose-Capillary: 100 mg/dL — ABNORMAL HIGH (ref 70–99)

## 2021-09-13 LAB — PROCALCITONIN: Procalcitonin: 0.87 ng/mL

## 2021-09-13 LAB — MAGNESIUM: Magnesium: 1.7 mg/dL (ref 1.7–2.4)

## 2021-09-13 LAB — PATHOLOGIST SMEAR REVIEW: Path Review: INCREASED

## 2021-09-13 LAB — TROPONIN I (HIGH SENSITIVITY): Troponin I (High Sensitivity): 402 ng/L (ref <18)

## 2021-09-13 LAB — HEPARIN LEVEL (UNFRACTIONATED): Heparin Unfractionated: 0.57 IU/mL (ref 0.30–0.70)

## 2021-09-13 MED ORDER — MORPHINE SULFATE (PF) 2 MG/ML IV SOLN
1.0000 mg | INTRAVENOUS | Status: AC | PRN
  Administered 2021-09-13: 1 mg via INTRAVENOUS
  Filled 2021-09-13: qty 1

## 2021-09-13 MED ORDER — ACETAMINOPHEN 650 MG RE SUPP: 650.0000 mg | RECTAL | Status: AC | PRN

## 2021-09-13 MED ORDER — MAGNESIUM SULFATE 2 GM/50ML IV SOLN
2.0000 g | Freq: Once | INTRAVENOUS | Status: AC
  Administered 2021-09-13: 2 g via INTRAVENOUS
  Filled 2021-09-13: qty 50

## 2021-09-13 MED ORDER — MORPHINE SULFATE (PF) 2 MG/ML IV SOLN
1.0000 mg | Freq: Once | INTRAVENOUS | Status: AC | PRN
  Administered 2021-09-13: 1 mg via INTRAVENOUS
  Filled 2021-09-13: qty 1

## 2021-09-13 NOTE — TOC Progression Note (Signed)
Transition of Care [facility]) - Progression Note    Patient Details  Name: Hannah Scott MRN: [id number] Date of Birth: 10-Apr-1951  Transition of Care [facility]) CM/SW Contact  Zenon Mayo, RN Phone Number: 09/13/2021, 2:33 PM  Clinical Narrative:    transfer from ICU, from home alone, septic shock, cellulitis, wounds. Npo, now due to not able to swallow, will eval swallowing today. Conts on 2 liters [place], wounds on bottom nonstageable.  TOC will continue to follow for dc needs.         Expected Discharge Plan and Services                                                 Social Determinants of Health (SDOH) Interventions    Readmission Risk Interventions     View : No data to display.

## 2021-09-13 NOTE — Progress Notes (Addendum)
PROGRESS NOTE    Hannah Scott  CLE:[id number] DOB: [birth year] DOA: 09/10/2021 PCP: Binnie Rail, MD    Brief Narrative:  Patient is a 71 year old female with pertinent PMH of HTN, HLD, CAD, asthma presents to the [facility] on 5/18 with septic shock.   Assessment and Plan: Septic Shock Cellulitis of LLE UTI with positive BC for 2 of 4 bottles positive for gram negative rods ( Called from [facility] 5/20 afternoon) Off Levophed 5/20 at 2300 - Continue  cefepime and linezolid - echo results as noted above, consider limited echo with Definity to visualize RV better - follow  cultures: bcx2, urine culture, and expectorated sputum    NSTEMI Elevated BNP Elevated Troponins on admission  -strict I/o's; daily weights - d/c heparin as patient with decreasing plts and nose bleed  -ICU team d/c'd tele   Elevated LFTs and INR: denies hx of alcohol use; CT abd/pelvis negative Total Bili continues to rise -trend cmp and INR - Monitor for worsening jaundice -ruq us>> As noted above, no acute issues -avoid hepatotoxic medications   AKI -resolved   L airspace opacity: likely due to hiatal hernia Hx of asthma:  take breo-ellipta at home and albuterol Atelectasis on CXR New cough>> ? aspirating -wean o2 for sats >92% -Pulm toiletry: IS -prn albuterol for wheezing - Aggressive Pulmonary Toilet  - Swallow Eval by speech>> NPO until eval completed- plan for FEEs   Hx of MI Hx of HTN, HLD -Hold home antihypertensives while hypotensive -resume home ASA -no statin on home med list   Hx of depression -consider resuming cymbalta in am   Hx of GERD and hiatal hernia -PPI   Chronic Pain -Will decrease  Norco 5 mg to  Q 12 prn    Thrombocytopenia D/c heparin and monitor closely         DVT prophylaxis:     Code Status: Partial Code Family Communication: at bedside  Disposition Plan:  Level of care: Med-Surg Status is: Inpatient Remains inpatient  appropriate because: needs    Consultants:  Tx from PCCM   Subjective: Having a nose bleed  Objective: Vitals:   09/13/21 0023 09/13/21 0359 09/13/21 0728 09/13/21 1157  BP: 132/75 122/71 118/87 123/77  Pulse: 64 (!) 115 (!) 124 (!) 119  Resp: 16 20 (!) 23 (!) 21  Temp: 97.8 F (36.6 C) 97.6 F (36.4 C) (!) 97.4 F (36.3 C) 97.6 F (36.4 C)  TempSrc: Oral Oral Oral Oral  SpO2: 99% 99% 96% 100%  Weight:  81.6 kg      Intake/Output Summary (Last 24 hours) at 09/13/2021 1303 Last data filed at 09/13/2021 0415 Gross per 24 hour  Intake 615.12 ml  Output 775 ml  Net -159.88 ml   Filed Weights   09/11/21 0500 09/12/21 0600 09/13/21 0359  Weight: 79.8 kg 80.3 kg 81.6 kg    Examination:   General: Appearance:    Obese female- jaundiced     Lungs:      respirations unlabored  Heart:    Tachycardic.    MS:   All extremities are intact.    Neurologic:   Awake, alert, weak appearing       Data Reviewed: I have personally reviewed following labs and imaging studies  CBC: Recent Labs  Lab 09/10/21 2232 09/11/21 1000 09/12/21 0443 09/13/21 0415  WBC 10.0 11.2* 12.4* 7.2  NEUTROABS 9.8*  --  10.8* 5.7  HGB 12.4 11.4* 11.4* 10.8*  HCT 42.9 37.4 38.7  35.9*  MCV 79.2* 77.0* 78.2* 76.4*  PLT 156 123* 99* 86*   Basic Metabolic Panel: Recent Labs  Lab 09/10/21 2232 09/11/21 1000 09/12/21 0443 09/12/21 0822 09/13/21 0415  NA 138 133* 134*  --  136  K 4.0 3.3* 3.8  --  3.9  CL 103 101 101  --  102  CO2 '28 27 29  '$ --  30  GLUCOSE 131* 215* 125*  --  91  BUN 42* 32* 24*  --  17  CREATININE 1.40* 0.98 0.75  --  0.70  CALCIUM 8.0* 7.8* 7.8*  --  7.9*  MG 1.7  --   --  2.0 1.7  PHOS 3.6  --   --   --   --    GFR: Estimated Creatinine Clearance: 60.1 mL/min (by C-G formula based on SCr of 0.7 mg/dL). Liver Function Tests: Recent Labs  Lab 09/10/21 2232 09/11/21 1000 09/12/21 0443 09/13/21 0415  AST 329* 224* 173* 151*  ALT 806* 644* 523* 420*  ALKPHOS  82 77 102 109  BILITOT 5.5* 5.7* 6.2* 6.4*  PROT 4.3* 3.8* 3.9* 3.9*  ALBUMIN 2.1* 1.9* 1.9* 1.8*   Recent Labs  Lab 09/10/21 2232  LIPASE 33   Recent Labs  Lab 09/11/21 1008  AMMONIA 21   Coagulation Profile: Recent Labs  Lab 09/10/21 2232 09/11/21 1000 09/12/21 0822  INR 2.0* 1.8* 1.6*   Cardiac Enzymes: No results for input(s): CKTOTAL, CKMB, CKMBINDEX, TROPONINI in the last 168 hours. BNP (last 3 results) No results for input(s): PROBNP in the last 8760 hours. HbA1C: No results for input(s): HGBA1C in the last 72 hours. CBG: Recent Labs  Lab 09/12/21 1959 09/12/21 2311 09/13/21 0357 09/13/21 0747 09/13/21 1155  GLUCAP 81 126* 101* 92 108*   Lipid Profile: No results for input(s): CHOL, HDL, LDLCALC, TRIG, CHOLHDL, LDLDIRECT in the last 72 hours. Thyroid Function Tests: Recent Labs    09/10/21 2232 09/11/21 1008  TSH 8.996*  --   FREET4  --  0.59*   Anemia Panel: No results for input(s): VITAMINB12, FOLATE, FERRITIN, TIBC, IRON, RETICCTPCT in the last 72 hours. Sepsis Labs: Recent Labs  Lab 09/10/21 2232 09/11/21 1008 09/12/21 0443 09/13/21 0415  PROCALCITON 2.23  --  1.71 0.87  LATICACIDVEN 3.0* 1.6 0.9  --     Recent Results (from the past 240 hour(s))  MRSA Next Gen by PCR, Nasal     Status: None   Collection Time: 09/10/21  7:06 PM   Specimen: Nasal Mucosa; Nasal Swab  Result Value Ref Range Status   MRSA by PCR Next Gen NOT DETECTED NOT DETECTED Final    Comment: (NOTE) The GeneXpert MRSA Assay (FDA approved for NASAL specimens only), is one component of a comprehensive MRSA colonization surveillance program. It is not intended to diagnose MRSA infection nor to guide or monitor treatment for MRSA infections. Test performance is not FDA approved in patients less than 44 years old. Performed at [facility], [place] [address], [place], [place] [address]   Culture, blood (Routine X 2) w Reflex to ID Panel     Status: None  (Preliminary result)   Collection Time: 09/10/21  9:43 PM   Specimen: BLOOD LEFT HAND  Result Value Ref Range Status   Specimen Description BLOOD LEFT HAND  Final   Special Requests   Final    AEROBIC BOTTLE ONLY Blood Culture results may not be optimal due to an inadequate volume of blood received in culture  bottles   Culture   Final    NO GROWTH 3 DAYS Performed at [facility], [place] [address] [place], [place] [address]    Report Status PENDING  Incomplete  Culture, blood (Routine X 2) w Reflex to ID Panel     Status: None (Preliminary result)   Collection Time: 09/10/21  9:56 PM   Specimen: BLOOD LEFT HAND  Result Value Ref Range Status   Specimen Description BLOOD LEFT HAND  Final   Special Requests   Final    AEROBIC BOTTLE ONLY Blood Culture results may not be optimal due to an inadequate volume of blood received in culture bottles   Culture   Final    NO GROWTH 3 DAYS Performed at [facility], [place] [address] [place], [place] [address]    Report Status PENDING  Incomplete  Urine Culture     Status: None   Collection Time: 09/10/21 10:15 PM   Specimen: Urine, Clean Catch  Result Value Ref Range Status   Specimen Description URINE, CLEAN CATCH  Final   Special Requests NONE  Final   Culture   Final    NO GROWTH Performed at [facility], [place] [address], [place], [place] [address]    Report Status 09/12/2021 FINAL  Final         Radiology Studies: DG CHEST PORT 1 VIEW  Result Date: 09/13/2021 CLINICAL DATA:  Dyspnea. EXAM: PORTABLE CHEST 1 VIEW COMPARISON:  09/11/2021. FINDINGS: 5:02 a.m., 09/13/2021. The heart is enlarged. There is interval increased vascular prominence. Lung volumes remain low with increasing left pleural effusion and left lower lobe consolidation or atelectasis. Right lower lung field again obscured by the elevated hemidiaphragm. The upper lung fields are generally clear but there may be a small right pleural effusion. There is a  stable mediastinum with aortic atherosclerosis and tortuosity. Severe thoracic dextroscoliosis. IMPRESSION: 1. Increased perihilar vascular congestion. 2. Increasing left pleural fluid and left lower lobe consolidation or atelectasis. Electronically Signed   By: Telford Nab M.D.   On: 09/13/2021 07:06        Scheduled Meds:  aspirin EC  81 mg Oral Daily   chlorhexidine  15 mL Mouth Rinse BID   Chlorhexidine Gluconate Cloth  6 each Topical Q0600   insulin aspart  0-15 Units Subcutaneous Q4H   levothyroxine  100 mcg Oral Daily   mouth rinse  15 mL Mouth Rinse q12n4p   mometasone-formoterol  2 puff Inhalation BID   pantoprazole (PROTONIX) IV  40 mg Intravenous Daily   sodium chloride flush  10-40 mL Intracatheter Q12H   Continuous Infusions:  sodium chloride 250 mL (09/13/21 0927)   ceFEPime (MAXIPIME) IV 2 g (09/13/21 0928)   dextrose 5% lactated ringers 50 mL/hr at 09/12/21 2300   linezolid (ZYVOX) IV 600 mg (09/13/21 1038)     LOS: 3 days    Time spent: 55 minutes spent on chart review, discussion with nursing staff, consultants, updating family and interview/physical exam; more than 50% of that time was spent in counseling and/or coordination of care.    Geradine Girt, DO [facility] Available via Epic secure chat 7am-7pm After these hours, please refer to coverage provider listed on [email or web address] 09/13/2021, 1:03 PM

## 2021-09-13 NOTE — Progress Notes (Signed)
eLink Physician-Brief Progress Note Patient Name: Hannah Scott DOB: June 24, 1950 MRN: [id number]   Date of Service  09/13/2021  HPI/Events of Note  Patient is currently NPO and needs something for pain. She was previously on Norco via NG tube.  eICU Interventions  PRN Tylenol suppositories order x 2 doses to take her to the morning when her NG tube will be replaced.        Kerry Kass Kaenan Jake 09/13/2021, 12:48 AM

## 2021-09-13 NOTE — Procedures (Signed)
Objective Swallowing Evaluation: Type of Study: FEES-Fiberoptic Endoscopic Evaluation of Swallow   Patient Details  Name: Hannah Scott MRN: [id number] Date of Birth: [birth year]  Today's Date: 09/13/2021 Time: SLP Start Time (ACUTE ONLY): 1352 -SLP Stop Time (ACUTE ONLY): 0102  SLP Time Calculation (min) (ACUTE ONLY): 25 min   Past Medical History:  Past Medical History:  Diagnosis Date   Anemia    hx   Arthritis    Asthma    PFTs, February, 2011, moderate obstructive disease with response to bronchodilators, normal lung volumes, moderate reduction in diffusing capacity   Atrial septal aneurysm    Echo, 2008-not noted on 13 echo   CAD (coronary artery disease)    90% distal LAD in the past  /   nuclear, 2008, no ischemia, ejection fraction 70%   Cancer ([place]) 2002   DUCTAL CIS--S/P LUMPECTOMY, RADIATION AND 6 WEEKS OF TAMOXIFEN   D-dimer, elevated    January, 2014   Depression    Ejection fraction    EF 60%, echo, October, 2008   Elevated CPK    January, 2014   Endometriosis 1989   RIGHT TUBE   Endometriosis 1987   LEFT TUBE/OVARY W FOCAL IN-SITU ENDOMETRIAL ADENOCARCINOMA   GERD (gastroesophageal reflux disease)    occ   H/O hiatal hernia    ?   Hyperlipidemia    Hypertension    Hypothyroidism    Patient has had in the past that she does not need treatment   Kyphoscoliosis    Obstructive airway disease ([place])    Pinched nerve    lower back   Shortness of breath    Echo 2/22: EF 60-65, no RWMA, mild LVH, GR 1  DD, GLS -24%, normal RVSF, mild MR, trivial AI, borderline dilation of aortic root (39 mm)   UTI (lower urinary tract infection)    Past Surgical History:  Past Surgical History:  Procedure Laterality Date   ANKLE SURGERY Left    ligament   APPENDECTOMY  1987   AT [facility]   BACK SURGERY     Fusion   BREAST LUMPECTOMY  2003   radiation on right   BREAST LUMPECTOMY WITH RADIOACTIVE SEED LOCALIZATION Left 02/12/2016   Procedure: LEFT BREAST LUMPECTOMY WITH  RADIOACTIVE SEED LOCALIZATION;  Surgeon: Excell Seltzer, MD;  Location: [place];  Service: General;  Laterality: Left;   CARDIOVASCULAR STRESS TEST  09/07/05   Nuclear, was negative   CATARACT EXTRACTION Bilateral 01,03   COLONOSCOPY WITH PROPOFOL N/A 05/04/2021   Procedure: COLONOSCOPY WITH PROPOFOL;  Surgeon: Gatha Mayer, MD;  [facility];  Service: Endoscopy;  Laterality: N/A;   ESOPHAGOGASTRODUODENOSCOPY (EGD) WITH PROPOFOL N/A 05/04/2021   Procedure: ESOPHAGOGASTRODUODENOSCOPY (EGD) WITH PROPOFOL;  Surgeon: Gatha Mayer, MD;  [facility];  Service: Endoscopy;  Laterality: N/A;   OOPHORECTOMY     LSO -RSO   PELVIC LAPAROSCOPY  1989   [facility]O, LYSIS OF ADHESIONS   POLYPECTOMY  05/04/2021   Procedure: POLYPECTOMY;  Surgeon: Gatha Mayer, MD;  [facility];  Service: Endoscopy;;   TOTAL ABDOMINAL HYSTERECTOMY  1987   LSO, APPENDECTOMY   TOTAL HIP ARTHROPLASTY Right 10/28/2013   dr Lorin [facility]   TOTAL HIP ARTHROPLASTY Right 10/28/2013   Procedure: TOTAL HIP ARTHROPLASTY ANTERIOR APPROACH;  Surgeon: Marybelle Killings, MD;  Location: [place];  Service: Orthopedics;  Laterality: Right;  Right Total Hip Arthroplasty, Direct Anterior Approach   HPI: Patient is a 71 year old female with pertinent PMH  of GERD, HTN, HLD, CAD, asthma presents to the [facility] on 5/18 with septic shock, cellulitis of LLE, UTI and transferred to [facility]. Patient fell on 5/18 c/o of right hip pain and right shoulde, N/V/D and poor p.o. intake past 4 days. Coughing with night nurse with thin.   No data recorded   Recommendations for follow up therapy are one component of a multi-disciplinary discharge planning process, led by the attending physician.  Recommendations may be updated based on patient status, additional functional criteria and insurance authorization.  Assessment / Plan / Recommendation     09/13/2021    2:59 PM  Clinical Impressions  Clinical Impression Pt  had mild nasal bleeding likely from O2 per pt and RN aware. Therapist able to pass scope on second trial and visualize pharyngolaryngeal anatomy. Pt has a history of paralyzed vocal cord and visualized pt's left cord, arytenoid cartilidge with significantly decreased adduction and mild exrescence on right cord. Oral manipulation and transit with thin and puree was adequate and timely. Given her deconditioned state and fatigue at time of test, regular or Dys 3 texture not administered. Timely pharyngeal swallow with appropriate white out period as epiglottis inverted. Thin or puree was not seen in laryngeal vestibule or below vocal cords in trachea nor when asked to cough. Minimal-mild pyriform residue that reduced with second swallow. Minimal tricke of blood seen on pharyngeal wall and trace in upper portion of vestibule x 1. Recommend Dys 1 (puree), thin liquids, straws allowed, small sips, crush pills ONLY when alert with stable respirations with full supervision/assist.  SLP Visit Diagnosis Dysphagia, pharyngeal phase (R13.13)  Impact on safety and function Mild aspiration risk         09/13/2021    2:59 PM  Treatment Recommendations  Treatment Recommendations Therapy as outlined in treatment plan below        09/13/2021    2:59 PM  Prognosis  Prognosis for Safe Diet Advancement Good  Barriers to Reach Goals --       09/13/2021    2:59 PM  Diet Recommendations  SLP Diet Recommendations Dysphagia 1 (Puree) solids;Thin liquid  Liquid Administration via Straw;Cup  Medication Administration Crushed with puree  Compensations Minimize environmental distractions;Slow rate;Small sips/bites  Postural Changes Seated upright at 90 degrees         09/13/2021    2:59 PM  Other Recommendations  Oral Care Recommendations Oral care BID  Follow Up Recommendations Skilled nursing-short term rehab (<3 hours/day)  Assistance recommended at discharge Frequent or constant Supervision/Assistance   Functional Status Assessment Patient has had a recent decline in their functional status and demonstrates the ability to make significant improvements in function in a reasonable and predictable amount of time.       09/13/2021    2:59 PM  Frequency and Duration   Speech Therapy Frequency (ACUTE ONLY) min 2x/week  Treatment Duration 2 weeks         09/13/2021    2:59 PM  Oral Phase  Oral Phase [facility]       09/13/2021    2:59 PM  Pharyngeal Phase  Pharyngeal Phase Impaired  Pharyngeal- Thin Straw Pharyngeal residue - pyriform  Pharyngeal- Puree Pharyngeal residue - pyriform        09/13/2021    2:59 PM  Cervical Esophageal Phase   Cervical Esophageal Phase WFL     [place] Siren 09/13/2021, 3:34 PM

## 2021-09-13 NOTE — Progress Notes (Signed)
eLink Physician-Brief Progress Note Patient Name: Hannah Scott DOB: 1950-08-07 MRN: [id number]   Date of Service  09/13/2021  HPI/Events of Note  Patient refused rectal Tylenol suppository and is asking for something iv for pain.  eICU Interventions  Morphine 1 mg iv x 1 ordered.        Hannah Scott 09/13/2021, 6:12 AM

## 2021-09-13 NOTE — Progress Notes (Signed)
Report given to Lattie Haw, RN of [place] staff. No belongings transferred with patient. Patient alert and oriented X4 and aware of transfer.

## 2021-09-13 NOTE — Progress Notes (Signed)
Asked Elink doc for some pain medicine IV since patient can't take anything PO. Patient c/o 7/10 all over pain. Awaiting new orders.

## 2021-09-13 NOTE — Progress Notes (Signed)
Patient transferred from [place]. Oriented to room. Two RN skin assessment completed with Luna Fuse, RN.

## 2021-09-14 ENCOUNTER — Encounter: Payer: Medicare Other | Admitting: Registered Nurse

## 2021-09-14 DIAGNOSIS — A419 Sepsis, unspecified organism: Secondary | ICD-10-CM | POA: Diagnosis not present

## 2021-09-14 DIAGNOSIS — R6521 Severe sepsis with septic shock: Secondary | ICD-10-CM | POA: Diagnosis not present

## 2021-09-14 LAB — COMPREHENSIVE METABOLIC PANEL
ALT: 360 U/L — ABNORMAL HIGH (ref 0–44)
AST: 129 U/L — ABNORMAL HIGH (ref 15–41)
Albumin: 2 g/dL — ABNORMAL LOW (ref 3.5–5.0)
Alkaline Phosphatase: 174 U/L — ABNORMAL HIGH (ref 38–126)
Anion gap: 5 (ref 5–15)
BUN: 11 mg/dL (ref 8–23)
CO2: 31 mmol/L (ref 22–32)
Calcium: 8.2 mg/dL — ABNORMAL LOW (ref 8.9–10.3)
Chloride: 100 mmol/L (ref 98–111)
Creatinine, Ser: 0.64 mg/dL (ref 0.44–1.00)
GFR, Estimated: >60 mL/min (ref >60)
Glucose, Bld: 95 mg/dL (ref 70–99)
Potassium: 3.9 mmol/L (ref 3.5–5.1)
Sodium: 136 mmol/L (ref 135–145)
Total Bilirubin: 6.9 mg/dL — ABNORMAL HIGH (ref 0.3–1.2)
Total Protein: 4.3 g/dL — ABNORMAL LOW (ref 6.5–8.1)

## 2021-09-14 LAB — GLUCOSE, CAPILLARY
Glucose-Capillary: 110 mg/dL — ABNORMAL HIGH (ref 70–99)
Glucose-Capillary: 123 mg/dL — ABNORMAL HIGH (ref 70–99)
Glucose-Capillary: 126 mg/dL — ABNORMAL HIGH (ref 70–99)
Glucose-Capillary: 85 mg/dL (ref 70–99)
Glucose-Capillary: 89 mg/dL (ref 70–99)
Glucose-Capillary: 97 mg/dL (ref 70–99)

## 2021-09-14 LAB — CBC
HCT: 37.4 % (ref 36.0–46.0)
Hemoglobin: 11.2 g/dL — ABNORMAL LOW (ref 12.0–15.0)
MCH: 22.8 pg — ABNORMAL LOW (ref 26.0–34.0)
MCHC: 29.9 g/dL — ABNORMAL LOW (ref 30.0–36.0)
MCV: 76.2 fL — ABNORMAL LOW (ref 80.0–100.0)
Platelets: 75 10*3/uL — ABNORMAL LOW (ref 150–400)
RBC: 4.91 MIL/uL (ref 3.87–5.11)
RDW: 20.3 % — ABNORMAL HIGH (ref 11.5–15.5)
WBC: 6.3 10*3/uL (ref 4.0–10.5)
nRBC: 0.3 % — ABNORMAL HIGH (ref 0.0–0.2)

## 2021-09-14 LAB — AMMONIA: Ammonia: 24 umol/L (ref 9–35)

## 2021-09-14 LAB — T3: T3, Total: 40 ng/dL — ABNORMAL LOW (ref 71–180)

## 2021-09-14 MED ORDER — CLOTRIMAZOLE 1 % EX CREA
TOPICAL_CREAM | Freq: Two times a day (BID) | CUTANEOUS | Status: DC
  Administered 2021-09-15 – 2021-09-17 (×2): 1 via TOPICAL
  Filled 2021-09-14 (×2): qty 15

## 2021-09-14 MED ORDER — HYDROCODONE-ACETAMINOPHEN 5-325 MG PO TABS
1.0000 | ORAL_TABLET | ORAL | Status: DC | PRN
  Administered 2021-09-14 – 2021-09-16 (×4): 1 via ORAL
  Filled 2021-09-14 (×4): qty 1

## 2021-09-14 MED ORDER — BISOPROLOL FUMARATE 5 MG PO TABS
5.0000 mg | ORAL_TABLET | Freq: Every day | ORAL | Status: DC
  Administered 2021-09-14 – 2021-09-21 (×8): 5 mg via ORAL
  Filled 2021-09-14 (×8): qty 1

## 2021-09-14 MED ORDER — INSULIN ASPART 100 UNIT/ML IJ SOLN
0.0000 [IU] | Freq: Three times a day (TID) | INTRAMUSCULAR | Status: DC
  Administered 2021-09-14 – 2021-09-20 (×3): 1 [IU] via SUBCUTANEOUS

## 2021-09-14 MED ORDER — SODIUM CHLORIDE 0.9 % IV SOLN
1.0000 g | Freq: Three times a day (TID) | INTRAVENOUS | Status: AC
  Administered 2021-09-14 – 2021-09-20 (×20): 1 g via INTRAVENOUS
  Filled 2021-09-14 (×21): qty 20

## 2021-09-14 MED ORDER — FUROSEMIDE 10 MG/ML IJ SOLN
20.0000 mg | Freq: Once | INTRAMUSCULAR | Status: AC
  Administered 2021-09-14: 20 mg via INTRAVENOUS
  Filled 2021-09-14: qty 2

## 2021-09-14 MED ORDER — INSULIN ASPART 100 UNIT/ML IJ SOLN: 0.0000 [IU] | Freq: Every day | INTRAMUSCULAR | Status: DC

## 2021-09-14 MED ORDER — DULOXETINE HCL 60 MG PO CPEP
90.0000 mg | ORAL_CAPSULE | Freq: Every day | ORAL | Status: DC
  Administered 2021-09-14 – 2021-09-16 (×3): 90 mg via ORAL
  Filled 2021-09-14 (×3): qty 1

## 2021-09-14 MED ORDER — PANTOPRAZOLE SODIUM 40 MG PO TBEC
40.0000 mg | DELAYED_RELEASE_TABLET | Freq: Every day | ORAL | Status: DC
  Administered 2021-09-15 – 2021-09-21 (×7): 40 mg via ORAL
  Filled 2021-09-14 (×7): qty 1

## 2021-09-14 MED ORDER — CHLORHEXIDINE GLUCONATE CLOTH 2 % EX PADS
6.0000 | MEDICATED_PAD | Freq: Every day | CUTANEOUS | Status: DC
  Administered 2021-09-14 – 2021-09-21 (×8): 6 via TOPICAL

## 2021-09-14 NOTE — Progress Notes (Signed)
Physical Therapy Evaluation Patient Details Name: Hannah Scott MRN: [id number] DOB: 1950-09-16 Today's Date: 09/14/2021  History of Present Illness  71 yo female with onset of LLE cellulitis and UTI was admitted on 5/18 through [facility], now to [facility] for tx.  Has septic shock, had a fall on5/18 with poor intake, N&V, pain on R side joints now.  PMHx:  GERD, HTN, HLD, CAD, asthma,  Clinical Impression  Pt was seen for mobility on RW and with nursing help to stand and get to chair, after which nursing was advised to use two person help back or stedy to support the transition. Pt is fatigued from bedrest, on O2 via mask and cannot remove without desaturating currently.  Pt is recommended to CIR for recovery of her independent gait and transfers, but has limited endurance for first effort to get up in chair.  Pt is demonstrating her previous ability to move by sidestepping with nursing and PT to the recliner.  Follow up with her for acute PT goals as are outlined below.      Recommendations for follow up therapy are one component of a multi-disciplinary discharge planning process, led by the attending physician.  Recommendations may be updated based on patient status, additional functional criteria and insurance authorization.  Follow Up Recommendations Acute inpatient rehab (3hours/day)    Assistance Recommended at Discharge Intermittent Supervision/Assistance  Patient can return home with the following  Two people to help with walking and/or transfers;A lot of help with bathing/dressing/bathroom;Assistance with cooking/housework;Assist for transportation;Help with stairs or ramp for entrance    Equipment Recommendations None recommended by PT  Recommendations for Other Services       Functional Status Assessment Patient has had a recent decline in their functional status and demonstrates the ability to make significant improvements in function in a reasonable and predictable amount of time.      Precautions / Restrictions Precautions Precautions: Fall;Other (comment) (monitor skin breakdown) Precaution Comments: mult open wounds over skin with weeping Restrictions Weight Bearing Restrictions: No Other Position/Activity Restrictions: elevation Of LE's recommended      Mobility  Bed Mobility Overal bed mobility: Needs Assistance Bed Mobility: Supine to Sit     Supine to sit: Mod assist     General bed mobility comments: mod assist for every step with cues for sequencing and avoiding pressure on skin wounds    Transfers Overall transfer level: Needs assistance Equipment used: Rolling walker (2 wheels), 2 person hand held assist Transfers: Sit to/from Stand, Bed to chair/wheelchair/BSC Sit to Stand: Mod assist   Step pivot transfers: Min assist, +2 physical assistance, +2 safety/equipment       General transfer comment: able to stand with one person but two for steps to chair for safety    Ambulation/Gait               General Gait Details: unable to walk functionally yet due to skin and weakness  Stairs            Wheelchair Mobility    Modified Rankin (Stroke Patients Only)       Balance Overall balance assessment: Needs assistance Sitting-balance support: Feet supported, Single extremity supported Sitting balance-Leahy Scale: Fair     Standing balance support: Bilateral upper extremity supported, During functional activity Standing balance-Leahy Scale: Poor Standing balance comment: Pt is having significant pain esp on LLE calf area and R foot near big toe  Pertinent Vitals/Pain Pain Assessment Pain Assessment: Faces Faces Pain Scale: Hurts even more Pain Location: edematous limbs with wounds Pain Descriptors / Indicators: Grimacing, Guarding, Sore Pain Intervention(s): Limited activity within patient's tolerance, Monitored during session, Premedicated before session, Repositioned     Home Living Family/patient expects to be discharged to:: Private residence Living Arrangements: Alone Available Help at Discharge: Family;Available PRN/intermittently Type of Home: House         Home Layout: One level Home Equipment: Cane - single point Additional Comments: has been driving at times    Prior Function Prior Level of Function : Independent/Modified Independent             Mobility Comments: per pt she was doing her own self care and moving on [facility] with her significant scoliosis ADLs Comments: sleeps up in a chair     Hand Dominance   Dominant Hand: Right    Extremity/Trunk Assessment   Upper Extremity Assessment Upper Extremity Assessment: Generalized weakness    Lower Extremity Assessment Lower Extremity Assessment: Generalized weakness    Cervical / Trunk Assessment Cervical / Trunk Assessment: Other exceptions (significant scoliosis with hyperkyphotic spine and rotation components)  Communication   Communication: No difficulties  Cognition Arousal/Alertness: Awake/alert Behavior During Therapy: WFL for tasks assessed/performed Overall Cognitive Status: Within Functional Limits for tasks assessed                                 General Comments: pt is giving PT her history and has been fairly independent        General Comments General comments (skin integrity, edema, etc.): pt has maceration and open skin esp on lower arms, nursing dressed the LUE on forearm to avoid skin tearing further and protection of the open area, weeping    Exercises Other Exercises Other Exercises: coordination WFL on BLE's   Assessment/Plan    PT Assessment Patient needs continued PT services  PT Problem List Decreased activity tolerance;Decreased balance;Decreased mobility;Decreased skin integrity;Pain       PT Treatment Interventions DME instruction;Gait training;Functional mobility training;Therapeutic activities;Therapeutic  exercise;Balance training;Neuromuscular re-education;Patient/family education;Stair training    PT Goals (Current goals can be found in the Care Plan section)  Acute Rehab PT Goals Patient Stated Goal: to get home again PT Goal Formulation: With patient Time For Goal Achievement: 09/28/21 Potential to Achieve Goals: Good    Frequency Min 3X/week     Co-evaluation               AM-PAC PT "6 Clicks" Mobility  Outcome Measure Help needed turning from your back to your side while in a flat bed without using bedrails?: A Lot Help needed moving from lying on your back to sitting on the side of a flat bed without using bedrails?: A Lot Help needed moving to and from a bed to a chair (including a wheelchair)?: Total Help needed standing up from a chair using your arms (e.g., wheelchair or bedside chair)?: Total Help needed to walk in hospital room?: Total Help needed climbing 3-5 steps with a railing? : Total 6 Click Score: 8    End of Session Equipment Utilized During Treatment: Oxygen;Gait belt Activity Tolerance: Patient limited by fatigue;Patient limited by pain;Treatment limited secondary to medical complications (Comment) Patient left: in chair;with call bell/phone within reach;with nursing/sitter in room Nurse Communication: Mobility status;Need for lift equipment PT Visit Diagnosis: Unsteadiness on feet (R26.81);Difficulty in walking, not elsewhere classified (R26.2);Pain Pain -  Right/Left:  (Bilaterally located) Pain - part of body: Arm;Hand;Leg;Ankle and joints of foot    Time: 1129-1209 PT Time Calculation (min) (ACUTE ONLY): 40 min   Charges:   PT Evaluation $PT Eval Moderate Complexity: 1 Mod PT Treatments $Therapeutic Activity: 23-37 mins       Ramond Dial 09/14/2021, 12:38 PM  Mee Hives, PT PhD Acute Rehab Dept. Number: [place]

## 2021-09-14 NOTE — Progress Notes (Signed)
Antibiotic stewardship note  Blood cultures from [facility] reveal Sphingomonas paucimobilis in 2/4 bottles.  Sensitive to Ciprofloxacin, levofloxacin and imipenem but resistant to cefazolin, ceftazidime, ceftriaxone, gentamicin, and tobramycin.  Discussed with Dr. Eliseo Squires - will change antibiotics to meropenem monotherapy for now.    Picture of culture result placed in media tab.  Heide Guile, PharmD, [place] Clinical Pharmacist Phone [phone]

## 2021-09-14 NOTE — Progress Notes (Signed)
Inpatient Rehab Admissions Coordinator:   Per PT recommendations pt was screened for CIR candidacy by Shann Medal, PT, DPT.  At this time pt appears to be a potential candidate.  I will place an rehab consult order for full assessment, per our protocol.  Will also place an order for OT eval and treat, as she will likely need OT documentation if insurance authorization is sought.   Shann Medal, PT, DPT Admissions Coordinator [phone] 09/14/21  2:00 PM

## 2021-09-14 NOTE — Progress Notes (Signed)
PROGRESS NOTE    Hannah Scott  RKY:[id number] DOB: 1951-01-23 DOA: 09/10/2021 PCP: Binnie Rail, MD    Brief Narrative:  Patient is a 71 year old female with pertinent PMH of HTN, HLD, CAD, asthma presents to the [facility] on 5/18 with septic shock.  Slow to improve.  Hospitalization complicated by shock liver and increasing bilirubin.   Assessment and Plan: Septic Shock Cellulitis of LLE UTI with positive BC for 2 of 4 bottles positive for gram negative rods (pending culture) Off Levophed 5/20 - Continue  cefepime and linezolid until cultures back - echo results as noted above, consider limited echo with Definity to visualize RV better - follow  cultures: bcx2, urine culture  NSTEMI Elevated BNP Elevated Troponins on admission  -strict I/o's; daily weights - d/c heparin as patient with decreasing plts and nose bleed  -IV lasix dosed daily   Elevated LFTs and INR: denies hx of alcohol use; CT abd/pelvis negative Total Bili continues to rise slowly -? Related to shock liver -trend cmp and INR - Monitor for worsening jaundice -ruq us>>  no acute issues -avoid hepatotoxic medications   AKI -resolved   L airspace opacity: likely due to hiatal hernia Hx of asthma:  take breo-ellipta at home and albuterol Atelectasis on CXR New cough>> r/o aspiration -wean o2 for sats >92% -Pulm toiletry: IS -prn albuterol for wheezing - Aggressive Pulmonary Toilet  - Swallow Eval by speech>> mech soft diet   Hx of MI Hx of HTN, HLD -resume home ASA -no statin due to liver elevations   Hx of depression -resume home meds   Hx of GERD and hiatal hernia -PPI   Chronic Pain -norco PRN    Thrombocytopenia D/c heparin and monitor closely         DVT prophylaxis: Place and maintain sequential compression device Start: 09/13/21 1532    Code Status: Partial Code Family Communication: at bedside 5/22- called 5/23 daughter  Disposition Plan:  Level of care:  Progressive Status is: Inpatient Remains inpatient appropriate because: needs    Consultants:  Tx from PCCM   Subjective: No SOB, no CP  Objective: Vitals:   09/14/21 0403 09/14/21 0826 09/14/21 0827 09/14/21 0851  BP: (!) 151/89     Pulse: (!) 112 (!) 117    Resp: 19 (!) 23    Temp: 98.2 F (36.8 C)   98.6 F (37 C)  TempSrc:    Oral  SpO2: 97% 97% 97%   Weight: 82 kg       Intake/Output Summary (Last 24 hours) at 09/14/2021 1229 Last data filed at 09/14/2021 1159 Gross per 24 hour  Intake 160 ml  Output 1825 ml  Net -1665 ml   Filed Weights   09/12/21 0600 09/13/21 0359 09/14/21 0403  Weight: 80.3 kg 81.6 kg 82 kg    Examination:  General: Appearance:    Obese female in no acute distress     Lungs:      respirations unlabored  Heart:    Tachycardic.   MS:   All extremities are intact. +edema  Neurologic:   Awake, alert, oriented x 3. Overall weak appearing         Data Reviewed: I have personally reviewed following labs and imaging studies  CBC: Recent Labs  Lab 09/10/21 2232 09/11/21 1000 09/12/21 0443 09/13/21 0415 09/14/21 0402  WBC 10.0 11.2* 12.4* 7.2 6.3  NEUTROABS 9.8*  --  10.8* 5.7  --   HGB 12.4 11.4* 11.4* 10.8* 11.2*  HCT 42.9 37.4 38.7 35.9* 37.4  MCV 79.2* 77.0* 78.2* 76.4* 76.2*  PLT 156 123* 99* 86* 75*   Basic Metabolic Panel: Recent Labs  Lab 09/10/21 2232 09/11/21 1000 09/12/21 0443 09/12/21 0822 09/13/21 0415 09/14/21 0402  NA 138 133* 134*  --  136 136  K 4.0 3.3* 3.8  --  3.9 3.9  CL 103 101 101  --  102 100  CO2 '28 27 29  '$ --  30 31  GLUCOSE 131* 215* 125*  --  91 95  BUN 42* 32* 24*  --  17 11  CREATININE 1.40* 0.98 0.75  --  0.70 0.64  CALCIUM 8.0* 7.8* 7.8*  --  7.9* 8.2*  MG 1.7  --   --  2.0 1.7  --   PHOS 3.6  --   --   --   --   --    GFR: Estimated Creatinine Clearance: 60.3 mL/min (by C-G formula based on SCr of 0.64 mg/dL). Liver Function Tests: Recent Labs  Lab 09/10/21 2232 09/11/21 1000  09/12/21 0443 09/13/21 0415 09/14/21 0402  AST 329* 224* 173* 151* 129*  ALT 806* 644* 523* 420* 360*  ALKPHOS 82 77 102 109 174*  BILITOT 5.5* 5.7* 6.2* 6.4* 6.9*  PROT 4.3* 3.8* 3.9* 3.9* 4.3*  ALBUMIN 2.1* 1.9* 1.9* 1.8* 2.0*   Recent Labs  Lab 09/10/21 2232  LIPASE 33   Recent Labs  Lab 09/11/21 1008 09/14/21 0400  AMMONIA 21 24   Coagulation Profile: Recent Labs  Lab 09/10/21 2232 09/11/21 1000 09/12/21 0822  INR 2.0* 1.8* 1.6*   Cardiac Enzymes: No results for input(s): CKTOTAL, CKMB, CKMBINDEX, TROPONINI in the last 168 hours. BNP (last 3 results) No results for input(s): PROBNP in the last 8760 hours. HbA1C: No results for input(s): HGBA1C in the last 72 hours. CBG: Recent Labs  Lab 09/13/21 2048 09/13/21 2332 09/14/21 0401 09/14/21 0848 09/14/21 1219  GLUCAP 100* 110* 89 85 126*   Lipid Profile: No results for input(s): CHOL, HDL, LDLCALC, TRIG, CHOLHDL, LDLDIRECT in the last 72 hours. Thyroid Function Tests: No results for input(s): TSH, T4TOTAL, FREET4, T3FREE, THYROIDAB in the last 72 hours.  Anemia Panel: No results for input(s): VITAMINB12, FOLATE, FERRITIN, TIBC, IRON, RETICCTPCT in the last 72 hours. Sepsis Labs: Recent Labs  Lab 09/10/21 2232 09/11/21 1008 09/12/21 0443 09/13/21 0415  PROCALCITON 2.23  --  1.71 0.87  LATICACIDVEN 3.0* 1.6 0.9  --     Recent Results (from the past 240 hour(s))  MRSA Next Gen by PCR, Nasal     Status: None   Collection Time: 09/10/21  7:06 PM   Specimen: Nasal Mucosa; Nasal Swab  Result Value Ref Range Status   MRSA by PCR Next Gen NOT DETECTED NOT DETECTED Final    Comment: (NOTE) The GeneXpert MRSA Assay (FDA approved for NASAL specimens only), is one component of a comprehensive MRSA colonization surveillance program. It is not intended to diagnose MRSA infection nor to guide or monitor treatment for MRSA infections. Test performance is not FDA approved in patients less than 62  years old. Performed at [facility], [place] [address], [place], [place] [address]   Culture, blood (Routine X 2) w Reflex to ID Panel     Status: None (Preliminary result)   Collection Time: 09/10/21  9:43 PM   Specimen: BLOOD LEFT HAND  Result Value Ref Range Status   Specimen Description BLOOD LEFT HAND  Final   Special Requests  Final    AEROBIC BOTTLE ONLY Blood Culture results may not be optimal due to an inadequate volume of blood received in culture bottles   Culture   Final    NO GROWTH 4 DAYS Performed at [facility] [address] [place], [place] [address]    Report Status PENDING  Incomplete  Culture, blood (Routine X 2) w Reflex to ID Panel     Status: None (Preliminary result)   Collection Time: 09/10/21  9:56 PM   Specimen: BLOOD LEFT HAND  Result Value Ref Range Status   Specimen Description BLOOD LEFT HAND  Final   Special Requests   Final    AEROBIC BOTTLE ONLY Blood Culture results may not be optimal due to an inadequate volume of blood received in culture bottles   Culture   Final    NO GROWTH 4 DAYS Performed at [facility], [place] [address], [place], [place] [address]    Report Status PENDING  Incomplete  Urine Culture     Status: None   Collection Time: 09/10/21 10:15 PM   Specimen: Urine, Clean Catch  Result Value Ref Range Status   Specimen Description URINE, CLEAN CATCH  Final   Special Requests NONE  Final   Culture   Final    NO GROWTH Performed at [facility], [place] [address] [place], [place] [address]    Report Status 09/12/2021 FINAL  Final         Radiology Studies: DG CHEST PORT 1 VIEW  Result Date: 09/13/2021 CLINICAL DATA:  Dyspnea. EXAM: PORTABLE CHEST 1 VIEW COMPARISON:  09/11/2021. FINDINGS: 5:02 a.m., 09/13/2021. The heart is enlarged. There is interval increased vascular prominence. Lung volumes remain low with increasing left pleural effusion and left lower lobe consolidation or atelectasis. Right  lower lung field again obscured by the elevated hemidiaphragm. The upper lung fields are generally clear but there may be a small right pleural effusion. There is a stable mediastinum with aortic atherosclerosis and tortuosity. Severe thoracic dextroscoliosis. IMPRESSION: 1. Increased perihilar vascular congestion. 2. Increasing left pleural fluid and left lower lobe consolidation or atelectasis. Electronically Signed   By: Telford Nab M.D.   On: 09/13/2021 07:06        Scheduled Meds:  aspirin EC  81 mg Oral Daily   bisoprolol  5 mg Oral Daily   chlorhexidine  15 mL Mouth Rinse BID   Chlorhexidine Gluconate Cloth  6 each Topical Daily   clotrimazole   Topical BID   DULoxetine  90 mg Oral Daily   insulin aspart  0-5 Units Subcutaneous QHS   insulin aspart  0-9 Units Subcutaneous TID WC   levothyroxine  100 mcg Oral Daily   mouth rinse  15 mL Mouth Rinse q12n4p   mometasone-formoterol  2 puff Inhalation BID   [START ON 09/15/2021] pantoprazole  40 mg Oral Daily   sodium chloride flush  10-40 mL Intracatheter Q12H   Continuous Infusions:  sodium chloride 250 mL (09/13/21 0927)   ceFEPime (MAXIPIME) IV Stopped (09/14/21 1206)   linezolid (ZYVOX) IV Stopped (09/14/21 1207)     LOS: 4 days    Time spent: 55 minutes spent on chart review, discussion with nursing staff, consultants, updating family and interview/physical exam; more than 50% of that time was spent in counseling and/or coordination of care.    Geradine Girt, DO [facility] Available via Epic secure chat 7am-7pm After these hours, please refer to coverage provider listed  on [email or web address] 09/14/2021, 12:29 PM

## 2021-09-14 NOTE — Care Management Important Message (Signed)
Important Message  Patient Details  Name: Hannah Scott MRN: [id number] Date of Birth: 08/12/1950   Medicare Important Message Given:  Yes     Shelda Altes 09/14/2021, 8:09 AM

## 2021-09-14 NOTE — Progress Notes (Signed)
Speech Language Pathology Treatment: Dysphagia  Patient Details Name: Hannah Scott MRN: [id number] DOB: 25-Aug-1950 Today's Date: 09/14/2021 Time: 0930-0950 SLP Time Calculation (min) (ACUTE ONLY): 20 min  Assessment / Plan / Recommendation Clinical Impression  Fed pt breakfast. She was willing to try pureed solids, but didn't enjoy them. States she usually eats regular solids. Pt was fatigued and needed rest breaks, but is able to masticate as needed without extra effort. No signs of aspiration with thin liquids. Recommend upgrade to mechanical soft with thins. Will f/u for tolerance at least once.   HPI HPI: Patient is a 71 year old female with pertinent PMH of GERD, HTN, HLD, CAD, asthma presents to the [facility] on 5/18 with septic shock, cellulitis of LLE, UTI and transferred to [facility]. Patient fell on 5/18 c/o of right hip pain and right shoulde, N/V/D and poor p.o. intake past 4 days. Coughing with night nurse with thin.      SLP Plan  Continue with current plan of care      Recommendations for follow up therapy are one component of a multi-disciplinary discharge planning process, led by the attending physician.  Recommendations may be updated based on patient status, additional functional criteria and insurance authorization.    Recommendations  Diet recommendations: Dysphagia 3 (mechanical soft);Thin liquid Medication Administration: Whole meds with liquid Supervision: Trained caregiver to feed patient;Full supervision/cueing for compensatory strategies Compensations: Minimize environmental distractions;Slow rate;Small sips/bites Postural Changes and/or Swallow Maneuvers: Seated upright 90 degrees                Follow Up Recommendations: Skilled nursing-short term rehab (<3 hours/day) Assistance recommended at discharge: Frequent or constant Supervision/Assistance SLP Visit Diagnosis: Dysphagia, pharyngeal phase (R13.13) Plan: Continue with current plan of  care           Rawan Riendeau, Katherene Ponto  09/14/2021, 11:15 AM

## 2021-09-15 DIAGNOSIS — N3001 Acute cystitis with hematuria: Secondary | ICD-10-CM | POA: Diagnosis not present

## 2021-09-15 DIAGNOSIS — N179 Acute kidney failure, unspecified: Secondary | ICD-10-CM | POA: Diagnosis not present

## 2021-09-15 DIAGNOSIS — L03116 Cellulitis of left lower limb: Secondary | ICD-10-CM | POA: Diagnosis not present

## 2021-09-15 DIAGNOSIS — A419 Sepsis, unspecified organism: Secondary | ICD-10-CM | POA: Diagnosis not present

## 2021-09-15 LAB — CBC
HCT: 37.7 % (ref 36.0–46.0)
Hemoglobin: 11.9 g/dL — ABNORMAL LOW (ref 12.0–15.0)
MCH: 23.2 pg — ABNORMAL LOW (ref 26.0–34.0)
MCHC: 31.6 g/dL (ref 30.0–36.0)
MCV: 73.6 fL — ABNORMAL LOW (ref 80.0–100.0)
Platelets: 73 10*3/uL — ABNORMAL LOW (ref 150–400)
RBC: 5.12 MIL/uL — ABNORMAL HIGH (ref 3.87–5.11)
RDW: 20.7 % — ABNORMAL HIGH (ref 11.5–15.5)
WBC: 6.3 10*3/uL (ref 4.0–10.5)
nRBC: 0.5 % — ABNORMAL HIGH (ref 0.0–0.2)

## 2021-09-15 LAB — BASIC METABOLIC PANEL
Anion gap: 8 (ref 5–15)
BUN: 14 mg/dL (ref 8–23)
CO2: 31 mmol/L (ref 22–32)
Calcium: 8.3 mg/dL — ABNORMAL LOW (ref 8.9–10.3)
Chloride: 97 mmol/L — ABNORMAL LOW (ref 98–111)
Creatinine, Ser: 0.77 mg/dL (ref 0.44–1.00)
GFR, Estimated: >60 mL/min (ref >60)
Glucose, Bld: 88 mg/dL (ref 70–99)
Potassium: 3.8 mmol/L (ref 3.5–5.1)
Sodium: 136 mmol/L (ref 135–145)

## 2021-09-15 LAB — GLUCOSE, CAPILLARY
Glucose-Capillary: 75 mg/dL (ref 70–99)
Glucose-Capillary: 69 mg/dL — ABNORMAL LOW (ref 70–99)
Glucose-Capillary: 112 mg/dL — ABNORMAL HIGH (ref 70–99)
Glucose-Capillary: 109 mg/dL — ABNORMAL HIGH (ref 70–99)
Glucose-Capillary: 112 mg/dL — ABNORMAL HIGH (ref 70–99)

## 2021-09-15 LAB — BLOOD GAS, ARTERIAL
Acid-Base Excess: 7.2 mmol/L — ABNORMAL HIGH (ref 0.0–2.0)
Bicarbonate: 33.1 mmol/L — ABNORMAL HIGH (ref 20.0–28.0)
Drawn by: 331761
O2 Saturation: 98.2 %
Patient temperature: 36.6
pCO2 arterial: 50 mmHg — ABNORMAL HIGH (ref 32–48)
pH, Arterial: 7.43 (ref 7.35–7.45)
pO2, Arterial: 89 mmHg (ref 83–108)

## 2021-09-15 LAB — CULTURE, BLOOD (ROUTINE X 2)
Culture: NO GROWTH
Culture: NO GROWTH

## 2021-09-15 NOTE — Progress Notes (Signed)
PT Cancellation Note  Patient Details Name: Hannah Scott MRN: [id number] DOB: [birth year]   Cancelled Treatment:    Reason Eval/Treat Not Completed: Medical issues which prohibited therapy.  Pt is mentally lethargic, has something of a malaise, unlike yesterday.  Noted her speech is delayed, and R foot is mottled and cooler than yesterday.  Nursing aware, follow up PT at another time.   Ramond Dial 09/15/2021, 1:44 PM  Mee Hives, PT PhD Acute Rehab Dept. Number: [place] [facility] [place]

## 2021-09-15 NOTE — Progress Notes (Signed)
Inpatient Rehab Admissions Coordinator:   Met with patient at bedside to discuss CIR recommendations and expectations of CIR stay.  Reviewed 3 hrs/day of therapy, physician follow up, and average length of stay ~2 weeks (depending on progress).  Pt noticeably SOB at rest, on 4L via nasal cannula, with difficulty answering questions due to WOB.  HR up to 100 at rest.  At this point, I do not feel pt can tolerate intensity of CIR program, and she agrees.  I will follow from a distance for a few days to see if she progresses, but would likely require less intense post-acute rehab such as SNF.   Shann Medal, PT, DPT Admissions Coordinator [phone] 09/15/21  12:12 PM

## 2021-09-15 NOTE — Evaluation (Signed)
Occupational Therapy Evaluation Patient Details Name: Hannah Scott MRN: [id number] DOB: November 29, 1950 Today's Date: 09/15/2021   History of Present Illness 71 yo female with onset of LLE cellulitis and UTI was admitted on 5/18 through [facility], now to [facility] for tx.  Has septic shock, had a fall on5/18 with poor intake, N&V, pain on R side joints now.  PMHx:  GERD, HTN, HLD, CAD, asthma,   Clinical Impression   Pt admitted for concerns listed above. PTA pt's family reported that she was very independent, completing ADL's and IADL's, including grocery shopping on her own. At this time, pt presents with decreased cognition, requiring multimodal cues, increased weakness, balance deficits, and decreased activity tolerance. Due to cognition and weakness, she is requiring max-total A for all ADL's and functional mobility at this time. Recommending AIR, as pt was very independent and wishes to return to independence, safely. OT will continue to follow acutely.       Recommendations for follow up therapy are one component of a multi-disciplinary discharge planning process, led by the attending physician.  Recommendations may be updated based on patient status, additional functional criteria and insurance authorization.   Follow Up Recommendations  Acute inpatient rehab (3hours/day)    Assistance Recommended at Discharge Frequent or constant Supervision/Assistance  Patient can return home with the following A lot of help with walking and/or transfers;A lot of help with bathing/dressing/bathroom;Assistance with cooking/housework;Direct supervision/assist for financial management;Direct supervision/assist for medications management;Assist for transportation;Help with stairs or ramp for entrance    Functional Status Assessment  Patient has had a recent decline in their functional status and demonstrates the ability to make significant improvements in function in a reasonable and predictable amount of time.   Equipment Recommendations  Other (comment) (TBD)    Recommendations for Other Services Rehab consult     Precautions / Restrictions Precautions Precautions: Fall;Other (comment) Precaution Comments: mult open wounds over skin with weeping Restrictions Weight Bearing Restrictions: No      Mobility Bed Mobility Overal bed mobility: Needs Assistance Bed Mobility: Supine to Sit, Sit to Supine     Supine to sit: Mod assist Sit to supine: Total assist, +2 for safety/equipment, +2 for physical assistance   General bed mobility comments: Pt assisting to come up to sitting, unable to follow commands to return to supine    Transfers                   General transfer comment: Defered due to cognition and work of breathign      Balance Overall balance assessment: Needs assistance Sitting-balance support: Feet supported, Single extremity supported Sitting balance-Leahy Scale: Poor                                     ADL either performed or assessed with clinical judgement   ADL Overall ADL's : Needs assistance/impaired                                       General ADL Comments: At this time requiring max to total assist due to cogntition     Vision Baseline Vision/History: 1 Wears glasses Ability to See in Adequate Light: 0 Adequate Patient Visual Report: No change from baseline Vision Assessment?: No apparent visual deficits     Perception     Praxis  Pertinent Vitals/Pain Pain Assessment Pain Assessment: No/denies pain     Hand Dominance Right   Extremity/Trunk Assessment Upper Extremity Assessment Upper Extremity Assessment: Generalized weakness   Lower Extremity Assessment Lower Extremity Assessment: Defer to PT evaluation   Cervical / Trunk Assessment Cervical / Trunk Assessment: Kyphotic;Other exceptions (significant scoliosis with hyperkyphotic spine and rotation components) Cervical / Trunk Exceptions:  Severe Scoliosis   Communication Communication Communication: No difficulties   Cognition Arousal/Alertness: Lethargic Behavior During Therapy: WFL for tasks assessed/performed Overall Cognitive Status: Impaired/Different from baseline Area of Impairment: Attention, Following commands, Safety/judgement, Awareness, Problem solving                   Current Attention Level: Sustained   Following Commands: Follows one step commands inconsistently, Follows one step commands with increased time Safety/Judgement: Decreased awareness of safety, Decreased awareness of deficits Awareness: Emergent Problem Solving: Slow processing, Decreased initiation, Difficulty sequencing, Requires verbal cues, Requires tactile cues General Comments: Pt with difficulty following 1 step directions, slow to respond     General Comments  On 4L at 100% spO2 on arrival, nose was bleeding, O2 removed, pt remained 93-96% in bed, once sitting EOB pt sp O2 dropped to 85% with poor pleth RN placed O2 back on 4L pt quickly returned to 100%, Once back at rest O2 down to 2L and pt maintaining sats at 98%. HR 97-111    Exercises     Shoulder Instructions      Home Living Family/patient expects to be discharged to:: Private residence Living Arrangements: Alone Available Help at Discharge: Family;Available PRN/intermittently Type of Home: House       Home Layout: One level     Bathroom Shower/Tub: Teacher, early years/pre: Standard     Home Equipment: Cane - single Barista (2 wheels)   Additional Comments: has been driving at times      Prior Functioning/Environment Prior Level of Function : Independent/Modified Independent             Mobility Comments: per pt she was doing her own self care and moving on [facility] with her significant scoliosis ADLs Comments: sleeps up in a chair        OT Problem List: Decreased strength;Decreased activity tolerance;Impaired balance  (sitting and/or standing);Decreased cognition;Decreased safety awareness;Cardiopulmonary status limiting activity;Decreased coordination;Impaired sensation;Impaired UE functional use      OT Treatment/Interventions: Therapeutic exercise;Self-care/ADL training;Energy conservation;DME and/or AE instruction;Therapeutic activities;Patient/family education;Balance training;Cognitive remediation/compensation    OT Goals(Current goals can be found in the care plan section) Acute Rehab OT Goals Patient Stated Goal: none stated OT Goal Formulation: With patient/family Time For Goal Achievement: 09/29/21 Potential to Achieve Goals: Fair ADL Goals Pt Will Perform Grooming: with modified independence;standing Pt Will Perform Lower Body Bathing: with min guard assist;sitting/lateral leans;sit to/from stand Pt Will Perform Lower Body Dressing: with min guard assist;sitting/lateral leans;sit to/from stand Pt Will Transfer to Toilet: with min guard assist;ambulating Pt Will Perform Toileting - Clothing Manipulation and hygiene: with min guard assist;sitting/lateral leans;sit to/from stand Additional ADL Goal #1: Pt will follow 2 step commands with no cuing.  OT Frequency: Min 2X/week    Co-evaluation              AM-PAC OT "6 Clicks" Daily Activity     Outcome Measure Help from another person eating meals?: A Lot Help from another person taking care of personal grooming?: A Lot Help from another person toileting, which includes using toliet, bedpan, or urinal?: Total Help  from another person bathing (including washing, rinsing, drying)?: Total Help from another person to put on and taking off regular upper body clothing?: A Lot Help from another person to put on and taking off regular lower body clothing?: Total 6 Click Score: 9   End of Session Equipment Utilized During Treatment: Oxygen Nurse Communication: Mobility status  Activity Tolerance: Patient tolerated treatment well Patient left:  in bed;with call bell/phone within reach;with bed alarm set;with family/visitor present  OT Visit Diagnosis: Unsteadiness on feet (R26.81);Other abnormalities of gait and mobility (R26.89);Muscle weakness (generalized) (M62.81)                Time: 3702-3017 OT Time Calculation (min): 32 min Charges:  OT General Charges $OT Visit: 1 Visit OT Evaluation $OT Eval Moderate Complexity: 1 Mod OT Treatments $Self Care/Home Management : 8-22 mins  Marielena Harvell H., OTR/L Acute Rehabilitation  Diane Hanel Elane Lorae Roig 09/15/2021, 6:20 PM

## 2021-09-15 NOTE — Progress Notes (Addendum)
PROGRESS NOTE    Hannah Scott  KGY:[id number] DOB: 21-Jan-1951 DOA: 09/10/2021 PCP: Binnie Rail, MD   Brief Narrative:  Patient is a 71 year old female with past medical history of hypertension, hyperlipidemia, coronary artery disease and asthma presented to the outside hospital at [facility] on 09/09/21 with nausea, vomiting,diarrhea and poor oral intake.  Patient was noted to be hypotensive and was given broad-spectrum antibiotic.  Patient was then transferred to [facility].  Hospitalization was complicated by shock liver and increasing bilirubin.  Assessment and Plan: Principal Problem:   Septic shock ([place]) Active Problems:   Debility   AKI (acute kidney injury) ([place])   Acute cystitis with hematuria   Cellulitis of left lower extremity   Elevated brain natriuretic peptide (BNP) level   NSTEMI (non-ST elevated myocardial infarction) ([facility])  Septic Shock/Cellulitis of LLE Was initially on cefepime and linezolid.  Currently on meropenem.  Blood cultures  negative so far in 5 days..  Blood pressure seems to be stable at this time but has some mottled and cool extremities.  We will continue to monitor.   Overall positive balance for 179 ml.  Latest blood pressure 135/95.  On supplemental oxygen saturating 100% temperature max of 98.2 F.  NSTEMI/ Elevated BNP Was on heparin drip which has been discontinued due to nosebleed.  On IV Lasix.  Continue strict intake and output charting.  Daily weights.  Continue aspirin, bisoprolol.   Elevated LFTs and INR: No history of alcohol use.  CT scan of the abdomen and pelvis negative.  Likely secondary to shock liver.  Right upper quadrant ultrasound negative for acute findings.  Continue to trend LFTs.  Latest AST 129 and ALT 360 with total bilirubin of 6.9  AKI Resolved.  Latest creatinine of 0.7.   Left airspace opacity: likely due to hiatal hernia  Hx of asthma: Continue bronchodilators.  Cough.  X-ray showing atelectasis.  On  albuterol, pulmonary toileting.  Seen by speech therapy and recommend mechanical soft diet.  Hx of MI/HTN, hyperlipidemia. Continue aspirin, beta-blockers.  Not on statins due to elevated LFT.   Hx of depression Patient is on gabapentin at home including Cymbalta.  Continue duloxetine at this time.   Hx of GERD and hiatal hernia Continue Protonix   Chronic Pain Continue Norco as needed   Thrombocytopenia Latest platelet count of 73.  We will continue to monitor.  Heparin discontinued due to nasal bleed.  Debility, deconditioning.  Seen by rehabilitation team and recommend CIR.  Poor oral intake and slight somnolence today.  We will check ammonia levels in AM.  LFTs are improving overall.  DVT prophylaxis: Place and maintain sequential compression device Start: 09/13/21 1532    Code Status: Partial Code  Family Communication:  Communicated with the patient and the patient's son and updated him about the clinical condition of the patient.  Disposition Plan:   Level of care: Progressive  Status is: Inpatient  Remains inpatient appropriate because: Pending clinical improvement, closer monitoring possible need for CIR, debility and deconditioning.   Consultants:  PCCM  Subjective:  Today, patient was seen and examined at bedside.  Appears to be very weak and deconditioned.  Complains of pain all over her body.  No nausea, vomiting or shortness of breath.  Objective: Vitals:   09/15/21 0725 09/15/21 0812 09/15/21 0828 09/15/21 1103  BP: (!) 145/83  110/65 (!) 135/96  Pulse: (!) 120  (!) 112 100  Resp: (!) 21  17 (!) 23  Temp: 98 F (36.7 C)   97.9 F (36.6 C)  TempSrc: Oral   Oral  SpO2: 97% 98% 100% 100%  Weight:        Intake/Output Summary (Last 24 hours) at 09/15/2021 1412 Last data filed at 09/15/2021 1120 Gross per 24 hour  Intake 220 ml  Output 700 ml  Net -480 ml   Filed Weights   09/13/21 0359 09/14/21 0403 09/15/21 0100  Weight: 81.6 kg 82 kg 81.4  kg   General: Obese built, not in obvious distress, slow to respond, on nasal cannula oxygen, mildly somnolent HENT:   Icterus noted.  Oral mucosa is moist.  Pallor noted as well. Chest:  .  Diminished breath sounds bilaterally. No crackles or wheezes.  CVS: S1 &S2 heard. No murmur.  Regular rate and rhythm.  Mild tachycardia noted. Abdomen: Soft, nontender, nondistended.  Bowel sounds are heard.   Extremities: No cyanosis, clubbing or edema.  Peripheral pulses are palpable. Psych: Mildly somnolent, slow to respond, CNS: Moves extremities.  Generalized weakness noted cool extremities. skin: Warm and dry.  Mottling of the of the skin noted.   Data Reviewed: I have personally reviewed following labs and imaging studies  CBC: Recent Labs  Lab 09/10/21 2232 09/11/21 1000 09/12/21 0443 09/13/21 0415 09/14/21 0402 09/15/21 0534  WBC 10.0 11.2* 12.4* 7.2 6.3 6.3  NEUTROABS 9.8*  --  10.8* 5.7  --   --   HGB 12.4 11.4* 11.4* 10.8* 11.2* 11.9*  HCT 42.9 37.4 38.7 35.9* 37.4 37.7  MCV 79.2* 77.0* 78.2* 76.4* 76.2* 73.6*  PLT 156 123* 99* 86* 75* 73*   Basic Metabolic Panel: Recent Labs  Lab 09/10/21 2232 09/11/21 1000 09/12/21 0443 09/12/21 0822 09/13/21 0415 09/14/21 0402 09/15/21 0534  NA 138 133* 134*  --  136 136 136  K 4.0 3.3* 3.8  --  3.9 3.9 3.8  CL 103 101 101  --  102 100 97*  CO2 '28 27 29  '$ --  '30 31 31  '$ GLUCOSE 131* 215* 125*  --  91 95 88  BUN 42* 32* 24*  --  '17 11 14  '$ CREATININE 1.40* 0.98 0.75  --  0.70 0.64 0.77  CALCIUM 8.0* 7.8* 7.8*  --  7.9* 8.2* 8.3*  MG 1.7  --   --  2.0 1.7  --   --   PHOS 3.6  --   --   --   --   --   --    GFR: Estimated Creatinine Clearance: 60.1 mL/min (by C-G formula based on SCr of 0.77 mg/dL). Liver Function Tests: Recent Labs  Lab 09/10/21 2232 09/11/21 1000 09/12/21 0443 09/13/21 0415 09/14/21 0402  AST 329* 224* 173* 151* 129*  ALT 806* 644* 523* 420* 360*  ALKPHOS 82 77 102 109 174*  BILITOT 5.5* 5.7* 6.2* 6.4*  6.9*  PROT 4.3* 3.8* 3.9* 3.9* 4.3*  ALBUMIN 2.1* 1.9* 1.9* 1.8* 2.0*   Recent Labs  Lab 09/10/21 2232  LIPASE 33   Recent Labs  Lab 09/11/21 1008 09/14/21 0400  AMMONIA 21 24   Coagulation Profile: Recent Labs  Lab 09/10/21 2232 09/11/21 1000 09/12/21 0822  INR 2.0* 1.8* 1.6*   Cardiac Enzymes: No results for input(s): CKTOTAL, CKMB, CKMBINDEX, TROPONINI in the last 168 hours. BNP (last 3 results) No results for input(s): PROBNP in the last 8760 hours. HbA1C: No results for input(s): HGBA1C in the last 72 hours. CBG: Recent Labs  Lab 09/14/21 1612 09/14/21 2141 09/15/21  4401 09/15/21 1102 09/15/21 1216  GLUCAP 123* 97 75 69* 112*   Lipid Profile: No results for input(s): CHOL, HDL, LDLCALC, TRIG, CHOLHDL, LDLDIRECT in the last 72 hours. Thyroid Function Tests: No results for input(s): TSH, T4TOTAL, FREET4, T3FREE, THYROIDAB in the last 72 hours.  Anemia Panel: No results for input(s): VITAMINB12, FOLATE, FERRITIN, TIBC, IRON, RETICCTPCT in the last 72 hours. Sepsis Labs: Recent Labs  Lab 09/10/21 2232 09/11/21 1008 09/12/21 0443 09/13/21 0415  PROCALCITON 2.23  --  1.71 0.87  LATICACIDVEN 3.0* 1.6 0.9  --     Recent Results (from the past 240 hour(s))  MRSA Next Gen by PCR, Nasal     Status: None   Collection Time: 09/10/21  7:06 PM   Specimen: Nasal Mucosa; Nasal Swab  Result Value Ref Range Status   MRSA by PCR Next Gen NOT DETECTED NOT DETECTED Final    Comment: (NOTE) The GeneXpert MRSA Assay (FDA approved for NASAL specimens only), is one component of a comprehensive MRSA colonization surveillance program. It is not intended to diagnose MRSA infection nor to guide or monitor treatment for MRSA infections. Test performance is not FDA approved in patients less than 3 years old. Performed at [facility], [place] [address], [place], [place] [address]   Culture, blood (Routine X 2) w Reflex to ID Panel     Status: None   Collection Time:  09/10/21  9:43 PM   Specimen: BLOOD LEFT HAND  Result Value Ref Range Status   Specimen Description BLOOD LEFT HAND  Final   Special Requests   Final    AEROBIC BOTTLE ONLY Blood Culture results may not be optimal due to an inadequate volume of blood received in culture bottles   Culture   Final    NO GROWTH 5 DAYS Performed at [facility], [place] [address] [place], [place] [address]    Report Status 09/15/2021 FINAL  Final  Culture, blood (Routine X 2) w Reflex to ID Panel     Status: None   Collection Time: 09/10/21  9:56 PM   Specimen: BLOOD LEFT HAND  Result Value Ref Range Status   Specimen Description BLOOD LEFT HAND  Final   Special Requests   Final    AEROBIC BOTTLE ONLY Blood Culture results may not be optimal due to an inadequate volume of blood received in culture bottles   Culture   Final    NO GROWTH 5 DAYS Performed at [facility], [place] [address] [place], [place] [address]    Report Status 09/15/2021 FINAL  Final  Urine Culture     Status: None   Collection Time: 09/10/21 10:15 PM   Specimen: Urine, Clean Catch  Result Value Ref Range Status   Specimen Description URINE, CLEAN CATCH  Final   Special Requests NONE  Final   Culture   Final    NO GROWTH Performed at [facility], [place] [address], [place], [place] [address]    Report Status 09/12/2021 FINAL  Final      Radiology Studies: No results found.   Scheduled Meds:  aspirin EC  81 mg Oral Daily   bisoprolol  5 mg Oral Daily   chlorhexidine  15 mL Mouth Rinse BID   Chlorhexidine Gluconate Cloth  6 each Topical Daily   clotrimazole   Topical BID   DULoxetine  90 mg Oral Daily   insulin aspart  0-5 Units Subcutaneous QHS   insulin aspart  0-9 Units Subcutaneous TID WC   levothyroxine  100 mcg Oral Daily   mouth rinse  15 mL Mouth Rinse q12n4p   mometasone-formoterol  2 puff Inhalation BID   pantoprazole  40 mg Oral Daily   sodium chloride flush  10-40 mL Intracatheter Q12H    Continuous Infusions:  sodium chloride 250 mL (09/13/21 0927)   meropenem (MERREM) IV 1 g (09/15/21 0530)     LOS: 5 days    Flora Lipps, MD [facility] Available via Epic secure chat 7am-7pm After these hours, please refer to coverage provider listed on [email or web address] 09/15/2021, 2:12 PM

## 2021-09-15 NOTE — TOC Progression Note (Signed)
Transition of Care [facility]) - Progression Note    Patient Details  Name: Hannah Scott MRN: [id number] Date of Birth: [birth year]  [facility]) CM/SW Contact  Zenon Mayo, RN Phone Number: 09/15/2021, 1:01 PM  Clinical Narrative:    Per CIR coordinator, spoke with patient and she and patient do not feel patient can tolerate intensity of CIR program, and she agrees.  CIR coordinator will follow from a distance for a few days to see if she progresses, but would likely require less intense post-acute rehab such as SNF. Will make CSW aware.         Expected Discharge Plan and Services                                                 Social Determinants of Health (SDOH) Interventions    Readmission Risk Interventions     View : No data to display.

## 2021-09-15 NOTE — Progress Notes (Signed)
PT Cancellation Note  Patient Details Name: Hannah Scott MRN: [id number] DOB: [birth year]   Cancelled Treatment:    Reason Eval/Treat Not Completed: Medical issues which prohibited therapy.  Low BS being managed by nursing and pt wants to wait.  Follow up at another time.   Ramond Dial 09/15/2021, 12:10 PM  Mee Hives, PT PhD Acute Rehab Dept. Number: [place]

## 2021-09-15 NOTE — Progress Notes (Signed)
OT Cancellation Note  Patient Details Name: RUBERTA HOLCK MRN: [id number] DOB: [birth year]   Cancelled Treatment:    Reason Eval/Treat Not Completed: Other (comment). Pt blood sugar is low and RN is in the room. Pt requesting OT return later. OT will follow up as schedule allows.   Antwione Picotte Elane Alton Bouknight 09/15/2021, 11:17 AM

## 2021-09-16 DIAGNOSIS — A419 Sepsis, unspecified organism: Secondary | ICD-10-CM | POA: Diagnosis not present

## 2021-09-16 DIAGNOSIS — R6521 Severe sepsis with septic shock: Secondary | ICD-10-CM | POA: Diagnosis not present

## 2021-09-16 LAB — CBC
HCT: 36.1 % (ref 36.0–46.0)
Hemoglobin: 11.1 g/dL — ABNORMAL LOW (ref 12.0–15.0)
MCH: 22.8 pg — ABNORMAL LOW (ref 26.0–34.0)
MCHC: 30.7 g/dL (ref 30.0–36.0)
MCV: 74.3 fL — ABNORMAL LOW (ref 80.0–100.0)
Platelets: 71 10*3/uL — ABNORMAL LOW (ref 150–400)
RBC: 4.86 MIL/uL (ref 3.87–5.11)
RDW: 20.6 % — ABNORMAL HIGH (ref 11.5–15.5)
WBC: 5.9 10*3/uL (ref 4.0–10.5)
nRBC: 0 % (ref 0.0–0.2)

## 2021-09-16 LAB — BLOOD GAS, VENOUS
Acid-Base Excess: 9.7 mmol/L — ABNORMAL HIGH (ref 0.0–2.0)
Bicarbonate: 37.3 mmol/L — ABNORMAL HIGH (ref 20.0–28.0)
O2 Saturation: 74.8 %
Patient temperature: 37
pCO2, Ven: 63 mmHg — ABNORMAL HIGH (ref 44–60)
pH, Ven: 7.38 (ref 7.25–7.43)
pO2, Ven: 44 mmHg (ref 32–45)

## 2021-09-16 LAB — COMPREHENSIVE METABOLIC PANEL
ALT: 235 U/L — ABNORMAL HIGH (ref 0–44)
AST: 74 U/L — ABNORMAL HIGH (ref 15–41)
Albumin: 2.3 g/dL — ABNORMAL LOW (ref 3.5–5.0)
Alkaline Phosphatase: 230 U/L — ABNORMAL HIGH (ref 38–126)
Anion gap: 8 (ref 5–15)
BUN: 23 mg/dL (ref 8–23)
CO2: 31 mmol/L (ref 22–32)
Calcium: 8.4 mg/dL — ABNORMAL LOW (ref 8.9–10.3)
Chloride: 98 mmol/L (ref 98–111)
Creatinine, Ser: 0.74 mg/dL (ref 0.44–1.00)
GFR, Estimated: >60 mL/min (ref >60)
Glucose, Bld: 99 mg/dL (ref 70–99)
Potassium: 4 mmol/L (ref 3.5–5.1)
Sodium: 137 mmol/L (ref 135–145)
Total Bilirubin: 6.1 mg/dL — ABNORMAL HIGH (ref 0.3–1.2)
Total Protein: 4.5 g/dL — ABNORMAL LOW (ref 6.5–8.1)

## 2021-09-16 LAB — BLOOD GAS, ARTERIAL
Acid-Base Excess: 8.8 mmol/L — ABNORMAL HIGH (ref 0.0–2.0)
Bicarbonate: 37 mmol/L — ABNORMAL HIGH (ref 20.0–28.0)
Drawn by: 53927
O2 Saturation: 71.6 %
Patient temperature: 36.7
pCO2 arterial: 66 mmHg (ref 32–48)
pH, Arterial: 7.35 (ref 7.35–7.45)
pO2, Arterial: 42 mmHg — ABNORMAL LOW (ref 83–108)

## 2021-09-16 LAB — AMMONIA: Ammonia: 24 umol/L (ref 9–35)

## 2021-09-16 LAB — GLUCOSE, CAPILLARY
Glucose-Capillary: 95 mg/dL (ref 70–99)
Glucose-Capillary: 82 mg/dL (ref 70–99)
Glucose-Capillary: 90 mg/dL (ref 70–99)
Glucose-Capillary: 114 mg/dL — ABNORMAL HIGH (ref 70–99)

## 2021-09-16 LAB — MAGNESIUM: Magnesium: 1.8 mg/dL (ref 1.7–2.4)

## 2021-09-16 MED ORDER — LEVOTHYROXINE SODIUM 112 MCG PO TABS
112.0000 ug | ORAL_TABLET | Freq: Every day | ORAL | Status: DC
  Administered 2021-09-18 – 2021-09-21 (×4): 112 ug via ORAL
  Filled 2021-09-16 (×4): qty 1

## 2021-09-16 MED ORDER — ENSURE ENLIVE PO LIQD
237.0000 mL | Freq: Two times a day (BID) | ORAL | Status: DC
  Administered 2021-09-16 – 2021-09-21 (×9): 237 mL via ORAL

## 2021-09-16 MED ORDER — OXYCODONE HCL 5 MG PO TABS
2.5000 mg | ORAL_TABLET | Freq: Four times a day (QID) | ORAL | Status: DC | PRN
  Administered 2021-09-16 – 2021-09-20 (×10): 2.5 mg via ORAL
  Filled 2021-09-16 (×10): qty 1

## 2021-09-16 MED ORDER — HYDRALAZINE HCL 20 MG/ML IJ SOLN
5.0000 mg | INTRAMUSCULAR | Status: DC | PRN
Start: 2021-09-16 — End: 2021-09-17
  Administered 2021-09-16: 5 mg via INTRAVENOUS
  Filled 2021-09-16: qty 1

## 2021-09-16 MED ORDER — HYDROCODONE-ACETAMINOPHEN 5-325 MG PO TABS: 0.5000 | ORAL_TABLET | ORAL | Status: DC | PRN

## 2021-09-16 MED ORDER — DEXTROSE-NACL 5-0.45 % IV SOLN
INTRAVENOUS | Status: DC
Start: 2021-09-16 — End: 2021-09-17

## 2021-09-16 NOTE — Progress Notes (Signed)
Lab notified RN of critical pCO2 lab value of 66. Paged Vann MD to notify.

## 2021-09-16 NOTE — Progress Notes (Signed)
   09/16/21 0105  Assess: MEWS Score  BP (!) 163/105  Pulse Rate 95  ECG Heart Rate 96  Resp (!) 28  SpO2 100 %  Assess: MEWS Score  MEWS Temp 0  MEWS Systolic 0  MEWS Pulse 0  MEWS RR 2  MEWS LOC 0  MEWS Score 2  MEWS Score Color Yellow  Assess: if the MEWS score is Yellow or Red  Were vital signs taken at a resting state? Yes  Focused Assessment No change from prior assessment  Early Detection of Sepsis Score *See Row Information* Low  MEWS guidelines implemented *See Row Information* No, vital signs rechecked  Treat  Pain Scale 0-10  Pain Score 0  Notify: Charge Nurse/RN  Name of Charge Nurse/RN Notified [place], RN  Date Charge Nurse/RN Notified 09/16/21  Time Charge Nurse/RN Notified 0105  Notify: Provider  Provider Name/Title Dr. Hal Hope  Date Provider Notified 09/16/21  Time Provider Notified 0155  Method of Notification Page  Notification Reason Other (Comment) (Blood pressure elevated.)  Provider response See new orders  Date of Provider Response 09/16/21  Time of Provider Response [id number]  Document  Patient Outcome Other (Comment) (Will recheck blood pressure)  Progress note created (see row info) Yes   PRN Hydralazine given, Blood pressure 140/80.

## 2021-09-16 NOTE — Progress Notes (Signed)
Physical Therapy Treatment Patient Details Name: Hannah Scott MRN: [id number] DOB: 15-Oct-1950 Today's Date: 09/16/2021   History of Present Illness 71 yo female with onset of LLE cellulitis and UTI was admitted on 5/18 through [facility], now to [facility] for tx.  Has septic shock, had a fall on5/18 with poor intake, N&V, pain on R side joints now.  PMHx:  GERD, HTN, HLD, CAD, asthma,    PT Comments    The pt was lethargic but responded to verbal cues and simple commands with increased time. The pt was assisted to sitting EOB with maxA to manage trunk and LE movements, but was able to maintain static sitting EOB without assist once in position. The pt completed LE and UE exercises from seated position until fatigued and needing minA to maintain seated balance. The pt then required totalA to return to supine and to reposition in bed. The pt attempted small hip lift for lateral scoot along EOB, but was dependent on assist from staff to complete with bed pad at this time. Will continue to benefit from skilled PT to progress mobility as well as functional strength in BLE to complete OOB transfers.    Recommendations for follow up therapy are one component of a multi-disciplinary discharge planning process, led by the attending physician.  Recommendations may be updated based on patient status, additional functional criteria and insurance authorization.  Follow Up Recommendations  Acute inpatient rehab (3hours/day)     Assistance Recommended at Discharge Intermittent Supervision/Assistance  Patient can return home with the following Two people to help with walking and/or transfers;A lot of help with bathing/dressing/bathroom;Assistance with cooking/housework;Assist for transportation;Help with stairs or ramp for entrance   Equipment Recommendations  None recommended by PT    Recommendations for Other Services       Precautions / Restrictions Precautions Precautions: Fall;Other (comment) Precaution  Comments: mult open wounds over skin with weeping, on 14L NRB Restrictions Weight Bearing Restrictions: No Other Position/Activity Restrictions: bilateral prevalon boots in bed     Mobility  Bed Mobility Overal bed mobility: Needs Assistance Bed Mobility: Supine to Sit, Sit to Supine     Supine to sit: Max assist, HOB elevated Sit to supine: Total assist, +2 for physical assistance   General bed mobility comments: pt assisted to long sitting with HOB elevation, then able to assist minimally with LE movements to EOB. unable to assist with trunk movement or LE movements to return to bed    Transfers Overall transfer level: Needs assistance Equipment used: 2 person hand held assist Transfers: Bed to chair/wheelchair/BSC            Lateral/Scoot Transfers: Total assist, +2 physical assistance General transfer comment: pt attempted to generate hip clearance for lateral scoot, minimal clearance without totalA of 2 with use of bed pad to move along EOB     Balance Overall balance assessment: Needs assistance Sitting-balance support: Feet supported, Single extremity supported Sitting balance-Leahy Scale: Poor Sitting balance - Comments: able to maintain static sitting with UE support, gradual posterior lean with fatigue Postural control: Posterior lean                                  Cognition Arousal/Alertness: Lethargic Behavior During Therapy: WFL for tasks assessed/performed Overall Cognitive Status: Impaired/Different from baseline Area of Impairment: Attention, Following commands, Safety/judgement, Awareness, Problem solving  Current Attention Level: Focused   Following Commands: Follows one step commands inconsistently, Follows one step commands with increased time Safety/Judgement: Decreased awareness of safety, Decreased awareness of deficits Awareness: Emergent Problem Solving: Slow processing, Decreased initiation, Difficulty  sequencing, Requires verbal cues, Requires tactile cues General Comments: Pt with difficulty following 1 step directions, slow to respond. intermittently falling asleep, but responds with max stimuli        Exercises General Exercises - Upper Extremity Shoulder Flexion: AAROM, Both, 5 reps, Seated Shoulder Extension: AROM, Both, 5 reps, Seated General Exercises - Lower Extremity Ankle Circles/Pumps: AROM, Both, 15 reps, Seated Long Arc Quad: AROM, Both, 5 reps, Seated Other Exercises Other Exercises: bilateral scapular retraction against min resistance x5 Other Exercises: bilateral chest press against min resistance x5    General Comments General comments (skin integrity, edema, etc.): VSS on 4L, pt falling asleep through session      Pertinent Vitals/Pain Pain Assessment Pain Assessment: Faces Faces Pain Scale: Hurts little more Pain Location: BLE with touch, otherwise intermittent grimacing Pain Descriptors / Indicators: Grimacing, Guarding, Sore Pain Intervention(s): Limited activity within patient's tolerance, Monitored during session, Repositioned     PT Goals (current goals can now be found in the care plan section) Acute Rehab PT Goals Patient Stated Goal: to get home again PT Goal Formulation: With patient Time For Goal Achievement: 09/28/21 Potential to Achieve Goals: Good Progress towards PT goals: Progressing toward goals    Frequency    Min 3X/week      PT Plan Current plan remains appropriate       AM-PAC PT "6 Clicks" Mobility   Outcome Measure  Help needed turning from your back to your side while in a flat bed without using bedrails?: Total Help needed moving from lying on your back to sitting on the side of a flat bed without using bedrails?: Total Help needed moving to and from a bed to a chair (including a wheelchair)?: Total Help needed standing up from a chair using your arms (e.g., wheelchair or bedside chair)?: Total Help needed to walk  in hospital room?: Total Help needed climbing 3-5 steps with a railing? : Total 6 Click Score: 6    End of Session Equipment Utilized During Treatment: Oxygen;Gait belt Activity Tolerance: Patient limited by fatigue;Patient limited by pain;Treatment limited secondary to medical complications (Comment) Patient left: in bed;with call bell/phone within reach;with bed alarm set Nurse Communication: Mobility status;Need for lift equipment PT Visit Diagnosis: Unsteadiness on feet (R26.81);Difficulty in walking, not elsewhere classified (R26.2);Pain Pain - Right/Left: Right Pain - part of body: Arm;Hand;Leg;Ankle and joints of foot     Time: 1110-1133 PT Time Calculation (min) (ACUTE ONLY): 23 min  Charges:  $Therapeutic Exercise: 23-37 mins                     West Carbo, PT, DPT   Acute Rehabilitation Department Pager #: [phone]   Sandra Cockayne 09/16/2021, 12:24 PM

## 2021-09-16 NOTE — Plan of Care (Signed)
  Problem: Education: Goal: Knowledge of General Education information will improve Description: Including pain rating scale, medication(s)/side effects and non-pharmacologic comfort measures Outcome: Progressing   Problem: Health Behavior/Discharge Planning: Goal: Ability to manage health-related needs will improve Outcome: Progressing   Problem: Clinical Measurements: Goal: Ability to maintain clinical measurements within normal limits will improve Outcome: Progressing Goal: Will remain free from infection Outcome: Progressing Goal: Respiratory complications will improve Outcome: Progressing Goal: Cardiovascular complication will be avoided Outcome: Progressing   Problem: Activity: Goal: Risk for activity intolerance will decrease Outcome: Progressing   Problem: Nutrition: Goal: Adequate nutrition will be maintained Outcome: Progressing   Problem: Safety: Goal: Ability to remain free from injury will improve Outcome: Progressing   Problem: Skin Integrity: Goal: Risk for impaired skin integrity will decrease Outcome: Progressing

## 2021-09-16 NOTE — Social Work (Signed)
CSW attempted to contact pt daughter no answer, CSW will continue to follow for DC planning needs.

## 2021-09-16 NOTE — Progress Notes (Addendum)
Came back to see patient== still very sleepy-- check ABG, delirium precautions  Family concerned about amount patient is eating-- will try protein shakes and then monitor intake-- may need cortrack with tube feeds  CO2 elevated-- will add Bipap and see how she responds  Eulogio Bear DO

## 2021-09-16 NOTE — Progress Notes (Signed)
PRN Hydralazine 5 mg administered for B/P of 162/98.

## 2021-09-16 NOTE — Progress Notes (Addendum)
PROGRESS NOTE    Hannah Scott  NIO:[id number] DOB: [birth year] DOA: 09/10/2021 PCP: Binnie Rail, MD    Brief Narrative:  Patient is a 71 year old female with pertinent PMH of HTN, HLD, CAD, asthma presents to the [facility] on 5/18 with septic shock.  Slow to improve.  Hospitalization complicated by shock liver and increasing bilirubin.  Slow to improve   Assessment and Plan: Septic Shock Cellulitis of LLE UTI with positive BC for 2 of 4 bottles positive for gram negative rods (pending culture) Off Levophed 5/20 - initially on cefepime and linezolid - culture from OSH Sphingomonas paucimobilis  : change to meropenem - echo results as noted above, consider limited echo with Definity to visualize RV better - follow cultures: bc x 2, urine culture  NSTEMI Elevated BNP Elevated Troponins on admission  -strict I/o's; daily weights - d/c heparin as patient with decreasing plts and nose bleed  -IV lasix dosed daily if needed -check HIT antibody and plts still trending down   Elevated LFTs and INR: denies hx of alcohol use; CT abd/pelvis negative Total Bili now trending down -? Related to shock liver -trend cmp and INR - Monitor for worsening jaundice -ruq us>>  no acute issues -avoid hepatotoxic medications   AKI -resolved   L airspace opacity: likely due to hiatal hernia Hx of asthma:  take breo-ellipta at home and albuterol Atelectasis on CXR New cough>> r/o aspiration -wean o2 for sats >92% -Pulm toiletry: IS -prn albuterol for wheezing - Aggressive Pulmonary Toilet  - Swallow Eval by speech>> mech soft diet   Hx of MI Hx of HTN, HLD -resume home ASA -no statin due to liver elevations   Hx of depression -resume home meds   Hx of GERD and hiatal hernia -PPI   Chronic Pain -norco PRN    Thrombocytopenia D/c heparin and monitor closely -check HIT antibody         DVT prophylaxis: Place and maintain sequential compression device Start:  09/13/21 1532    Code Status: Partial Code Family Communication: LM for daughter 5/25  Disposition Plan:  Level of care: Progressive Status is: Inpatient Remains inpatient appropriate because: needs CIR, slow to improve    Consultants:  Tx from PCCM   Subjective: Sleepy today  Objective: Vitals:   09/16/21 0115 09/16/21 0437 09/16/21 0732 09/16/21 0840  BP:  (!) 142/88 (!) 145/92   Pulse: 98 95 93   Resp: (!) 26 17 (!) 24   Temp:  97.8 F (36.6 C)    TempSrc:  Oral    SpO2: 100% 100% 100% 100%  Weight:  80.4 kg      Intake/Output Summary (Last 24 hours) at 09/16/2021 1159 Last data filed at 09/16/2021 0443 Gross per 24 hour  Intake 0 ml  Output 351 ml  Net -351 ml   Filed Weights   09/14/21 0403 09/15/21 0100 09/16/21 0437  Weight: 82 kg 81.4 kg 80.4 kg    Examination:   General: Appearance:    Obese female weak appearing in bed, jaundice improved     Lungs:     respirations unlabored  Heart:    Normal heart rate.   MS:   All extremities are intact. Less redness in hands  Neurologic:   Awake, alert- weak appearing           Data Reviewed: I have personally reviewed following labs and imaging studies  CBC: Recent Labs  Lab 09/10/21 2232 09/11/21 1000 09/12/21 0443 09/13/21 0415  09/14/21 0402 09/15/21 0534 09/16/21 0450  WBC 10.0   < > 12.4* 7.2 6.3 6.3 5.9  NEUTROABS 9.8*  --  10.8* 5.7  --   --   --   HGB 12.4   < > 11.4* 10.8* 11.2* 11.9* 11.1*  HCT 42.9   < > 38.7 35.9* 37.4 37.7 36.1  MCV 79.2*   < > 78.2* 76.4* 76.2* 73.6* 74.3*  PLT 156   < > 99* 86* 75* 73* 71*   < > = values in this interval not displayed.   Basic Metabolic Panel: Recent Labs  Lab 09/10/21 2232 09/11/21 1000 09/12/21 0443 09/12/21 0822 09/13/21 0415 09/14/21 0402 09/15/21 0534 09/16/21 0450  NA 138   < > 134*  --  136 136 136 137  K 4.0   < > 3.8  --  3.9 3.9 3.8 4.0  CL 103   < > 101  --  102 100 97* 98  CO2 28   < > 29  --  '30 31 31 31  '$ GLUCOSE 131*    < > 125*  --  91 95 88 99  BUN 42*   < > 24*  --  '17 11 14 23  '$ CREATININE 1.40*   < > 0.75  --  0.70 0.64 0.77 0.74  CALCIUM 8.0*   < > 7.8*  --  7.9* 8.2* 8.3* 8.4*  MG 1.7  --   --  2.0 1.7  --   --  1.8  PHOS 3.6  --   --   --   --   --   --   --    < > = values in this interval not displayed.   GFR: Estimated Creatinine Clearance: 59.7 mL/min (by C-G formula based on SCr of 0.74 mg/dL). Liver Function Tests: Recent Labs  Lab 09/11/21 1000 09/12/21 0443 09/13/21 0415 09/14/21 0402 09/16/21 0450  AST 224* 173* 151* 129* 74*  ALT 644* 523* 420* 360* 235*  ALKPHOS 77 102 109 174* 230*  BILITOT 5.7* 6.2* 6.4* 6.9* 6.1*  PROT 3.8* 3.9* 3.9* 4.3* 4.5*  ALBUMIN 1.9* 1.9* 1.8* 2.0* 2.3*   Recent Labs  Lab 09/10/21 2232  LIPASE 33   Recent Labs  Lab 09/11/21 1008 09/14/21 0400 09/16/21 0450  AMMONIA '21 24 24   '$ Coagulation Profile: Recent Labs  Lab 09/10/21 2232 09/11/21 1000 09/12/21 0822  INR 2.0* 1.8* 1.6*   Cardiac Enzymes: No results for input(s): CKTOTAL, CKMB, CKMBINDEX, TROPONINI in the last 168 hours. BNP (last 3 results) No results for input(s): PROBNP in the last 8760 hours. HbA1C: No results for input(s): HGBA1C in the last 72 hours. CBG: Recent Labs  Lab 09/15/21 1102 09/15/21 1216 09/15/21 1506 09/15/21 2110 09/16/21 0642  GLUCAP 69* 112* 109* 112* 95   Lipid Profile: No results for input(s): CHOL, HDL, LDLCALC, TRIG, CHOLHDL, LDLDIRECT in the last 72 hours. Thyroid Function Tests: No results for input(s): TSH, T4TOTAL, FREET4, T3FREE, THYROIDAB in the last 72 hours.  Anemia Panel: No results for input(s): VITAMINB12, FOLATE, FERRITIN, TIBC, IRON, RETICCTPCT in the last 72 hours. Sepsis Labs: Recent Labs  Lab 09/10/21 2232 09/11/21 1008 09/12/21 0443 09/13/21 0415  PROCALCITON 2.23  --  1.71 0.87  LATICACIDVEN 3.0* 1.6 0.9  --     Recent Results (from the past 240 hour(s))  MRSA Next Gen by PCR, Nasal     Status: None    Collection Time: 09/10/21  7:06 PM   Specimen: Nasal  Mucosa; Nasal Swab  Result Value Ref Range Status   MRSA by PCR Next Gen NOT DETECTED NOT DETECTED Final    Comment: (NOTE) The GeneXpert MRSA Assay (FDA approved for NASAL specimens only), is one component of a comprehensive MRSA colonization surveillance program. It is not intended to diagnose MRSA infection nor to guide or monitor treatment for MRSA infections. Test performance is not FDA approved in patients less than 22 years old. Performed at [facility], [place] [address], [place], [place] [address]   Culture, blood (Routine X 2) w Reflex to ID Panel     Status: None   Collection Time: 09/10/21  9:43 PM   Specimen: BLOOD LEFT HAND  Result Value Ref Range Status   Specimen Description BLOOD LEFT HAND  Final   Special Requests   Final    AEROBIC BOTTLE ONLY Blood Culture results may not be optimal due to an inadequate volume of blood received in culture bottles   Culture   Final    NO GROWTH 5 DAYS Performed at [facility], [place] [address] [place], [place] [address]    Report Status 09/15/2021 FINAL  Final  Culture, blood (Routine X 2) w Reflex to ID Panel     Status: None   Collection Time: 09/10/21  9:56 PM   Specimen: BLOOD LEFT HAND  Result Value Ref Range Status   Specimen Description BLOOD LEFT HAND  Final   Special Requests   Final    AEROBIC BOTTLE ONLY Blood Culture results may not be optimal due to an inadequate volume of blood received in culture bottles   Culture   Final    NO GROWTH 5 DAYS Performed at [facility], [place] [address], [place], [place] [address]    Report Status 09/15/2021 FINAL  Final  Urine Culture     Status: None   Collection Time: 09/10/21 10:15 PM   Specimen: Urine, Clean Catch  Result Value Ref Range Status   Specimen Description URINE, CLEAN CATCH  Final   Special Requests NONE  Final   Culture   Final    NO GROWTH Performed at [facility], [place] [address], [place], [place] [address]    Report Status 09/12/2021 FINAL  Final         Radiology Studies: No results found.      Scheduled Meds:  aspirin EC  81 mg Oral Daily   bisoprolol  5 mg Oral Daily   chlorhexidine  15 mL Mouth Rinse BID   Chlorhexidine Gluconate Cloth  6 each Topical Daily   clotrimazole   Topical BID   DULoxetine  90 mg Oral Daily   insulin aspart  0-5 Units Subcutaneous QHS   insulin aspart  0-9 Units Subcutaneous TID WC   [START ON 09/17/2021] levothyroxine  112 mcg Oral Daily   mouth rinse  15 mL Mouth Rinse q12n4p   mometasone-formoterol  2 puff Inhalation BID   pantoprazole  40 mg Oral Daily   sodium chloride flush  10-40 mL Intracatheter Q12H   Continuous Infusions:  sodium chloride 250 mL (09/13/21 0927)   meropenem (MERREM) IV 1 g (09/16/21 0714)     LOS: 6 days    Time spent: 55 minutes spent on chart review, discussion with nursing staff, consultants, updating family and interview/physical exam; more than 50% of that time was spent in counseling and/or coordination of care.    Geradine Girt, DO [facility] Available  via Epic secure chat 7am-7pm After these hours, please refer to coverage provider listed on [email or web address] 09/16/2021, 11:59 AM

## 2021-09-16 NOTE — NC FL2 (Signed)
[place] MEDICAID FL2 LEVEL OF CARE SCREENING TOOL     IDENTIFICATION  Patient Name: Hannah Scott Birthdate: [birth year] Sex: female Admission Date (Current Location): 09/10/2021  [facility] and [place] Number:  Herbalist and Address:  [facility] [place], [place] [address], [place], [place] [address]      Provider Number: [id number]  Attending Physician Name and Address:  Geradine Girt, DO  Relative Name and Phone Number:  Plass,Lisa (Daughter)   [phone]    Current Level of Care: Hospital Recommended Level of Care: [place] Prior Approval Number:    Date Approved/Denied:   PASRR Number: [id number]  Discharge Plan: SNF    Current Diagnoses: Patient Active Problem List   Diagnosis Date Noted   Septic shock ([place]) 09/10/2021   AKI (acute kidney injury) ([place])    Acute cystitis with hematuria    Cellulitis of left lower extremity    Elevated brain natriuretic peptide (BNP) level    NSTEMI (non-ST elevated myocardial infarction) [facility])    Esophageal dysphagia    Colon cancer screening    Benign neoplasm of ascending colon    Benign neoplasm of descending colon    GERD (gastroesophageal reflux disease) 01/11/2021   Vitamin D deficiency 01/11/2021   Insomnia 09/11/2019   Murmur, cardiac 01/16/2018   Moderate persistent asthma with exacerbation 12/13/2017   Osteoporosis 01/07/2017   Acute bronchitis 10/17/2016   Diabetes mellitus without complication ([place]) 26/37/8588   History of breast cancer 01/06/2016   Intraductal papilloma of left breast 01/06/2016   Essential hypertension 06/08/2015   DOE (dyspnea on exertion) 06/08/2015   Debility 05/20/2015   Back pain 04/22/2015   Anemia due to blood loss 11/27/2013   Elevated CPK    Multinodular goiter 12/01/2011   Asthma    Hypothyroidism    CAD (coronary artery disease)    Atrial septal aneurysm    Hyperlipidemia    Scoliosis    Spinal stenosis, lumbar region, with  neurogenic claudication 07/01/2011   Lumbar postlaminectomy syndrome 07/01/2011   Osteoarthritis of right hip 07/01/2011   Contracture of right hip 07/01/2011   Endometriosis    Depression 12/24/2009   Migraine headache 05/19/2006    Orientation RESPIRATION BLADDER Height & Weight     Self  O2 (Bipap) Incontinent, External catheter Weight: 177 lb 4 oz (80.4 kg) Height:     BEHAVIORAL SYMPTOMS/MOOD NEUROLOGICAL BOWEL NUTRITION STATUS      Continent    AMBULATORY STATUS COMMUNICATION OF NEEDS Skin   Extensive Assist Verbally Normal                       Personal Care Assistance Level of Assistance  Bathing, Feeding, Dressing Bathing Assistance: Maximum assistance Feeding assistance: Independent Dressing Assistance: Maximum assistance     Functional Limitations Info  Sight, Hearing, Speech Sight Info: Adequate Hearing Info: Adequate Speech Info: Adequate    SPECIAL CARE FACTORS FREQUENCY  PT (By licensed PT), OT (By licensed OT)     PT Frequency: 5x a day OT Frequency: 5x a day            Contractures Contractures Info: Not present    Additional Factors Info  Code Status, Allergies Code Status Info: Partial Allergies Info: Fluticasone-salmeterol   Norvasc (Amlodipine Besylate)   Tape   Morphine           Current Medications (09/16/2021):  This is the current hospital active medication list Current Facility-Administered  Medications  Medication Dose Route Frequency Provider Last Rate Last Admin   0.9 %  sodium chloride infusion  250 mL Intravenous Continuous Spero Geralds, MD 10 mL/hr at 09/13/21 0927 250 mL at 09/13/21 4287   aspirin EC tablet 81 mg  81 mg Oral Daily Spero Geralds, MD   81 mg at 09/16/21 0939   bisoprolol (ZEBETA) tablet 5 mg  5 mg Oral Daily Eulogio Bear U, DO   5 mg at 09/16/21 6811   chlorhexidine (PERIDEX) 0.12 % solution 15 mL  15 mL Mouth Rinse BID Spero Geralds, MD   15 mL at 09/16/21 5726   Chlorhexidine Gluconate Cloth 2 %  PADS 6 each  6 each Topical Daily Geradine Girt, DO   6 each at 09/16/21 0947   clotrimazole (LOTRIMIN) 1 % cream   Topical BID Geradine Girt, DO   Given at 09/16/21 0948   dextrose 5 %-0.45 % sodium chloride infusion   Intravenous Continuous Eulogio Bear U, DO 50 mL/hr at 09/16/21 1452 New Bag at 09/16/21 1452   feeding supplement (ENSURE ENLIVE / ENSURE PLUS) liquid 237 mL  237 mL Oral BID BM Vann, Jessica U, DO       hydrALAZINE (APRESOLINE) injection 5 mg  5 mg Intravenous Q4H PRN Rise Patience, MD   5 mg at 09/16/21 0207   insulin aspart (novoLOG) injection 0-5 Units  0-5 Units Subcutaneous QHS Vann, Jessica U, DO       insulin aspart (novoLOG) injection 0-9 Units  0-9 Units Subcutaneous TID WC Vann, Jessica U, DO   1 Units at 09/14/21 1629   ipratropium-albuterol (DUONEB) 0.5-2.5 (3) MG/3ML nebulizer solution 3 mL  3 mL Nebulization Q4H PRN Spero Geralds, MD   3 mL at 09/14/21 0040   [START ON 09/17/2021] levothyroxine (SYNTHROID) tablet 112 mcg  112 mcg Oral Daily Eulogio Bear U, DO       MEDLINE mouth rinse  15 mL Mouth Rinse q12n4p Spero Geralds, MD   15 mL at 09/16/21 1202   meropenem (MERREM) 1 g in sodium chloride 0.9 % 100 mL IVPB  1 g Intravenous Q8H Vann, Jessica U, DO 200 mL/hr at 09/16/21 1516 1 g at 09/16/21 1516   mometasone-formoterol (DULERA) 100-5 MCG/ACT inhaler 2 puff  2 puff Inhalation BID Spero Geralds, MD   2 puff at 09/16/21 0840   oxyCODONE (Oxy IR/ROXICODONE) immediate release tablet 2.5 mg  2.5 mg Oral Q6H PRN Eulogio Bear U, DO       pantoprazole (PROTONIX) EC tablet 40 mg  40 mg Oral Daily Alvira Philips, RPH   40 mg at 09/16/21 2035   sodium chloride flush (NS) 0.9 % injection 10-40 mL  10-40 mL Intracatheter Q12H Spero Geralds, MD   10 mL at 09/16/21 0948   sodium chloride flush (NS) 0.9 % injection 10-40 mL  10-40 mL Intracatheter PRN Spero Geralds, MD         Discharge Medications: Please see discharge summary for a list of discharge  medications.  Relevant Imaging Results:  Relevant Lab Results:   Additional Information [id number]  Reece Agar, LCSWA

## 2021-09-17 ENCOUNTER — Inpatient Hospital Stay (HOSPITAL_COMMUNITY): Payer: Medicare Other

## 2021-09-17 DIAGNOSIS — A419 Sepsis, unspecified organism: Secondary | ICD-10-CM | POA: Diagnosis not present

## 2021-09-17 DIAGNOSIS — R6521 Severe sepsis with septic shock: Secondary | ICD-10-CM | POA: Diagnosis not present

## 2021-09-17 LAB — COMPREHENSIVE METABOLIC PANEL
ALT: 165 U/L — ABNORMAL HIGH (ref 0–44)
AST: 49 U/L — ABNORMAL HIGH (ref 15–41)
Albumin: 2.1 g/dL — ABNORMAL LOW (ref 3.5–5.0)
Alkaline Phosphatase: 212 U/L — ABNORMAL HIGH (ref 38–126)
Anion gap: 6 (ref 5–15)
BUN: 24 mg/dL — ABNORMAL HIGH (ref 8–23)
CO2: 34 mmol/L — ABNORMAL HIGH (ref 22–32)
Calcium: 8.4 mg/dL — ABNORMAL LOW (ref 8.9–10.3)
Chloride: 99 mmol/L (ref 98–111)
Creatinine, Ser: 0.61 mg/dL (ref 0.44–1.00)
GFR, Estimated: >60 mL/min (ref >60)
Glucose, Bld: 113 mg/dL — ABNORMAL HIGH (ref 70–99)
Potassium: 4 mmol/L (ref 3.5–5.1)
Sodium: 139 mmol/L (ref 135–145)
Total Bilirubin: 3.8 mg/dL — ABNORMAL HIGH (ref 0.3–1.2)
Total Protein: 4 g/dL — ABNORMAL LOW (ref 6.5–8.1)

## 2021-09-17 LAB — CBC
HCT: 34.2 % — ABNORMAL LOW (ref 36.0–46.0)
Hemoglobin: 10.4 g/dL — ABNORMAL LOW (ref 12.0–15.0)
MCH: 23.3 pg — ABNORMAL LOW (ref 26.0–34.0)
MCHC: 30.4 g/dL (ref 30.0–36.0)
MCV: 76.7 fL — ABNORMAL LOW (ref 80.0–100.0)
Platelets: 65 10*3/uL — ABNORMAL LOW (ref 150–400)
RBC: 4.46 MIL/uL (ref 3.87–5.11)
RDW: 20.9 % — ABNORMAL HIGH (ref 11.5–15.5)
WBC: 4.8 10*3/uL (ref 4.0–10.5)
nRBC: 0 % (ref 0.0–0.2)

## 2021-09-17 LAB — GLUCOSE, CAPILLARY
Glucose-Capillary: 78 mg/dL (ref 70–99)
Glucose-Capillary: 123 mg/dL — ABNORMAL HIGH (ref 70–99)
Glucose-Capillary: 102 mg/dL — ABNORMAL HIGH (ref 70–99)
Glucose-Capillary: 105 mg/dL — ABNORMAL HIGH (ref 70–99)

## 2021-09-17 LAB — HEPARIN INDUCED PLATELET AB (HIT ANTIBODY): Heparin Induced Plt Ab: 0.065 OD (ref 0.000–0.400)

## 2021-09-17 LAB — MAGNESIUM: Magnesium: 1.4 mg/dL — ABNORMAL LOW (ref 1.7–2.4)

## 2021-09-17 MED ORDER — SODIUM CHLORIDE 0.9 % IV SOLN: INTRAVENOUS | Status: DC | PRN

## 2021-09-17 MED ORDER — FUROSEMIDE 10 MG/ML IJ SOLN
20.0000 mg | Freq: Once | INTRAMUSCULAR | Status: AC
  Administered 2021-09-17: 20 mg via INTRAVENOUS
  Filled 2021-09-17: qty 2

## 2021-09-17 MED ORDER — MAGNESIUM SULFATE 4 GM/100ML IV SOLN
4.0000 g | Freq: Once | INTRAVENOUS | Status: AC
  Administered 2021-09-17: 4 g via INTRAVENOUS
  Filled 2021-09-17: qty 100

## 2021-09-17 MED ORDER — MAGNESIUM SULFATE 2 GM/50ML IV SOLN: 2.0000 g | Freq: Once | INTRAVENOUS | Status: DC

## 2021-09-17 NOTE — Progress Notes (Signed)
Inpatient Rehab Admissions Coordinator:   Pt continues to be max/total for all mobility/ADLs.  Do not feel she will be able to tolerate the intensity of CIR at this time.  Note TOC working on SNF.    Shann Medal, PT, DPT Admissions Coordinator [phone] 09/17/21  1:36 PM

## 2021-09-17 NOTE — Progress Notes (Signed)
PROGRESS NOTE    Hannah Scott  OIZ:[id number] DOB: 03-Mar-1951 DOA: 09/10/2021 PCP: Binnie Rail, MD    Brief Narrative:  Patient is a 71 year old female with pertinent PMH of HTN, HLD, CAD, asthma presents to the [facility] on 5/18 with septic shock.  Slow to improve.  Hospitalization complicated by shock liver and increasing bilirubin (now decreasing) along with delirium/fatigue   Assessment and Plan: Septic Shock-resolved Cellulitis of LLE-improved UTI with positive BC for 2 of 4 bottles positive for gram negative rods (pending culture) - initially on cefepime and linezolid - culture from OSH Sphingomonas paucimobilis  : change to meropenem - echo results as noted above, consider limited echo with Definity to visualize RV better once more stable - follow cultures: bc x 2, urine culture  NSTEMI Elevated BNP Elevated Troponins on admission  -strict I/o's; daily weights - d/c heparin as patient with decreasing plts and nose bleed  -IV lasix dosed daily if needed (x ray 5/26 shows pleural effusions -check HIT antibody and plts still trending down -bipap PRN   Elevated LFTs and INR: denies hx of alcohol use; CT abd/pelvis negative Total Bili now trending down -? Related to shock liver -ruq us>>  no acute issues -avoid hepatotoxic medications   AKI -resolved   L airspace opacity: likely due to hiatal hernia Hx of asthma:  take breo-ellipta at home and albuterol Atelectasis on CXR- encourage incentive spirometry New cough>> r/o aspiration -wean o2 for sats 90-92% -Pulm toiletry: IS -prn albuterol for wheezing - Aggressive Pulmonary Toilet  - Swallow Eval by speech>> mech soft diet, aspiration precautions    Hx of MI Hx of HTN, HLD -resume home ASA -no statin due to liver elevations   Hx of depression -resume home meds   Hx of GERD and hiatal hernia -PPI   Chronic Pain -norco PRN- monitor for sedating effets    Thrombocytopenia D/c'd heparin and monitor  closely -check HIT antibody  Hypomagnesemia -replete       DVT prophylaxis: Place and maintain sequential compression device Start: 09/13/21 1532    Code Status: Partial Code Family Communication: called daughter 5/26-- please update daily  Disposition Plan:  Level of care: Progressive Status is: Inpatient Remains inpatient appropriate because: SNF slow to improve    Consultants:  Tx from PCCM   Subjective: Much more awake after night of BIPAP  Ate well this AM  Objective: Vitals:   09/17/21 0348 09/17/21 0447 09/17/21 0827 09/17/21 0901  BP: (!) 106/48 116/71  102/60  Pulse: 80 80  87  Resp: '13 13  16  '$ Temp:  98.5 F (36.9 C)  97.8 F (36.6 C)  TempSrc:  Axillary  Oral  SpO2: 100% 100% 100% 100%  Weight: 82.1 kg       Intake/Output Summary (Last 24 hours) at 09/17/2021 1139 Last data filed at 09/17/2021 1118 Gross per 24 hour  Intake 1667.3 ml  Output 400 ml  Net 1267.3 ml   Filed Weights   09/15/21 0100 09/16/21 0437 09/17/21 0348  Weight: 81.4 kg 80.4 kg 82.1 kg    Examination:   General: Appearance:    Obese female who appear frail but more engaging today     Lungs:     Diminished at bases, respirations unlabored  Heart:    Normal heart rate.   MS:   All extremities are intact. Min LE edema  Neurologic:   Awake, alert, mouth dry so voice soft     Data Reviewed: I  have personally reviewed following labs and imaging studies  CBC: Recent Labs  Lab 09/10/21 2232 09/11/21 1000 09/12/21 0443 09/13/21 0415 09/14/21 0402 09/15/21 0534 09/16/21 0450 09/17/21 0520  WBC 10.0   < > 12.4* 7.2 6.3 6.3 5.9 4.8  NEUTROABS 9.8*  --  10.8* 5.7  --   --   --   --   HGB 12.4   < > 11.4* 10.8* 11.2* 11.9* 11.1* 10.4*  HCT 42.9   < > 38.7 35.9* 37.4 37.7 36.1 34.2*  MCV 79.2*   < > 78.2* 76.4* 76.2* 73.6* 74.3* 76.7*  PLT 156   < > 99* 86* 75* 73* 71* 65*   < > = values in this interval not displayed.   Basic Metabolic Panel: Recent Labs  Lab  09/10/21 2232 09/11/21 1000 09/12/21 0822 09/13/21 0415 09/14/21 0402 09/15/21 0534 09/16/21 0450 09/17/21 0520 09/17/21 1002  NA 138   < >  --  136 136 136 137 139  --   K 4.0   < >  --  3.9 3.9 3.8 4.0 4.0  --   CL 103   < >  --  102 100 97* 98 99  --   CO2 28   < >  --  '30 31 31 31 '$ 34*  --   GLUCOSE 131*   < >  --  91 95 88 99 113*  --   BUN 42*   < >  --  '17 11 14 23 '$ 24*  --   CREATININE 1.40*   < >  --  0.70 0.64 0.77 0.74 0.61  --   CALCIUM 8.0*   < >  --  7.9* 8.2* 8.3* 8.4* 8.4*  --   MG 1.7  --  2.0 1.7  --   --  1.8  --  1.4*  PHOS 3.6  --   --   --   --   --   --   --   --    < > = values in this interval not displayed.   GFR: Estimated Creatinine Clearance: 60.3 mL/min (by C-G formula based on SCr of 0.61 mg/dL). Liver Function Tests: Recent Labs  Lab 09/12/21 0443 09/13/21 0415 09/14/21 0402 09/16/21 0450 09/17/21 0520  AST 173* 151* 129* 74* 49*  ALT 523* 420* 360* 235* 165*  ALKPHOS 102 109 174* 230* 212*  BILITOT 6.2* 6.4* 6.9* 6.1* 3.8*  PROT 3.9* 3.9* 4.3* 4.5* 4.0*  ALBUMIN 1.9* 1.8* 2.0* 2.3* 2.1*   Recent Labs  Lab 09/10/21 2232  LIPASE 33   Recent Labs  Lab 09/11/21 1008 09/14/21 0400 09/16/21 0450  AMMONIA '21 24 24   '$ Coagulation Profile: Recent Labs  Lab 09/10/21 2232 09/11/21 1000 09/12/21 0822  INR 2.0* 1.8* 1.6*   Cardiac Enzymes: No results for input(s): CKTOTAL, CKMB, CKMBINDEX, TROPONINI in the last 168 hours. BNP (last 3 results) No results for input(s): PROBNP in the last 8760 hours. HbA1C: No results for input(s): HGBA1C in the last 72 hours. CBG: Recent Labs  Lab 09/16/21 1200 09/16/21 1646 09/16/21 2222 09/17/21 0634 09/17/21 1111  GLUCAP 82 90 114* 78 123*   Lipid Profile: No results for input(s): CHOL, HDL, LDLCALC, TRIG, CHOLHDL, LDLDIRECT in the last 72 hours. Thyroid Function Tests: No results for input(s): TSH, T4TOTAL, FREET4, T3FREE, THYROIDAB in the last 72 hours.  Anemia Panel: No results for  input(s): VITAMINB12, FOLATE, FERRITIN, TIBC, IRON, RETICCTPCT in the last 72 hours. Sepsis Labs: Recent  Labs  Lab 09/10/21 2232 09/11/21 1008 09/12/21 0443 09/13/21 0415  PROCALCITON 2.23  --  1.71 0.87  LATICACIDVEN 3.0* 1.6 0.9  --     Recent Results (from the past 240 hour(s))  MRSA Next Gen by PCR, Nasal     Status: None   Collection Time: 09/10/21  7:06 PM   Specimen: Nasal Mucosa; Nasal Swab  Result Value Ref Range Status   MRSA by PCR Next Gen NOT DETECTED NOT DETECTED Final    Comment: (NOTE) The GeneXpert MRSA Assay (FDA approved for NASAL specimens only), is one component of a comprehensive MRSA colonization surveillance program. It is not intended to diagnose MRSA infection nor to guide or monitor treatment for MRSA infections. Test performance is not FDA approved in patients less than 10 years old. Performed at [facility], [place] [address] [place], [place] [address]   Culture, blood (Routine X 2) w Reflex to ID Panel     Status: None   Collection Time: 09/10/21  9:43 PM   Specimen: BLOOD LEFT HAND  Result Value Ref Range Status   Specimen Description BLOOD LEFT HAND  Final   Special Requests   Final    AEROBIC BOTTLE ONLY Blood Culture results may not be optimal due to an inadequate volume of blood received in culture bottles   Culture   Final    NO GROWTH 5 DAYS Performed at [facility], [place] [address] [place], [place] [address]    Report Status 09/15/2021 FINAL  Final  Culture, blood (Routine X 2) w Reflex to ID Panel     Status: None   Collection Time: 09/10/21  9:56 PM   Specimen: BLOOD LEFT HAND  Result Value Ref Range Status   Specimen Description BLOOD LEFT HAND  Final   Special Requests   Final    AEROBIC BOTTLE ONLY Blood Culture results may not be optimal due to an inadequate volume of blood received in culture bottles   Culture   Final    NO GROWTH 5 DAYS Performed at [facility], [place] [address] [place], [place]  [address]    Report Status 09/15/2021 FINAL  Final  Urine Culture     Status: None   Collection Time: 09/10/21 10:15 PM   Specimen: Urine, Clean Catch  Result Value Ref Range Status   Specimen Description URINE, CLEAN CATCH  Final   Special Requests NONE  Final   Culture   Final    NO GROWTH Performed at [facility], [place] [address], [place], [place] [address]    Report Status 09/12/2021 FINAL  Final         Radiology Studies: DG CHEST PORT 1 VIEW  Result Date: 09/17/2021 CLINICAL DATA:  Shortness of breath. EXAM: PORTABLE CHEST 1 VIEW COMPARISON:  Chest radiograph 09/13/2021 FINDINGS: The patient is rotated to the right. A right PICC remains in place with tip not well visualized. The cardiac silhouette remains enlarged. Lung volumes remain low with pulmonary vascular congestion. There is chronic elevation of the right hemidiaphragm. Bibasilar opacities have mildly increased and may reflect a combination of small pleural effusions and atelectasis or consolidation. No pneumothorax is identified. Thoracic scoliosis is noted. The cardiac silhouette remains enlarged. IMPRESSION: Low lung volumes with pulmonary vascular congestion and increased bibasilar opacities which may reflect a combination of small pleural effusions and atelectasis or consolidation. Electronically Signed   By: Logan Bores M.D.   On: 09/17/2021 08:34  Scheduled Meds:  aspirin EC  81 mg Oral Daily   bisoprolol  5 mg Oral Daily   chlorhexidine  15 mL Mouth Rinse BID   Chlorhexidine Gluconate Cloth  6 each Topical Daily   clotrimazole   Topical BID   feeding supplement  237 mL Oral BID BM   insulin aspart  0-5 Units Subcutaneous QHS   insulin aspart  0-9 Units Subcutaneous TID WC   levothyroxine  112 mcg Oral Daily   mouth rinse  15 mL Mouth Rinse q12n4p   mometasone-formoterol  2 puff Inhalation BID   pantoprazole  40 mg Oral Daily   sodium chloride flush  10-40 mL Intracatheter Q12H   Continuous  Infusions:  sodium chloride 250 mL (09/13/21 0927)   meropenem (MERREM) IV 1 g (09/17/21 0529)     LOS: 7 days    Time spent: 55 minutes spent on chart review, discussion with nursing staff, consultants, updating family and interview/physical exam; more than 50% of that time was spent in counseling and/or coordination of care.    Geradine Girt, DO [facility] Available via Epic secure chat 7am-7pm After these hours, please refer to coverage provider listed on [email or web address] 09/17/2021, 11:39 AM

## 2021-09-17 NOTE — Progress Notes (Signed)
Speech Language Pathology Treatment: Dysphagia  Patient Details Name: Hannah Scott MRN: [id number] DOB: Apr 21, 1951 Today's Date: 09/17/2021 Time: 4688-7373 SLP Time Calculation (min) (ACUTE ONLY): 8 min  Assessment / Plan / Recommendation Clinical Impression  Pt seen for safety and efficiency with recommendations encountered sleeping but easily awoken. She denies coughing or having difficulty swallowing or masticating Dys 3 texture. Consumed straw sips water and Dys 3 texture without s/sx aspiration and coordinated swallow with respirations. Reminded to ensure mouth is clear throughout and end of meals. Recommend continue Dys 3, thin, pills with thin and further ST is not needed at this time.    HPI HPI: Patient is a 71 year old female with pertinent PMH of GERD, HTN, HLD, CAD, asthma presents to the [facility] on 5/18 with septic shock, cellulitis of LLE, UTI and transferred to [facility]. Patient fell on 5/18 c/o of right hip pain and right shoulde, N/V/D and poor p.o. intake past 4 days. Coughing with night nurse with thin.      SLP Plan  All goals met;Discharge SLP treatment due to (comment)      Recommendations for follow up therapy are one component of a multi-disciplinary discharge planning process, led by the attending physician.  Recommendations may be updated based on patient status, additional functional criteria and insurance authorization.    Recommendations  Diet recommendations: Dysphagia 3 (mechanical soft);Thin liquid Liquids provided via: Cup;Straw Medication Administration: Whole meds with liquid Supervision: Staff to assist with self feeding Compensations: Slow rate;Small sips/bites Postural Changes and/or Swallow Maneuvers: Seated upright 90 degrees                Oral Care Recommendations: Oral care BID Follow Up Recommendations: Skilled nursing-short term rehab (<3 hours/day) Assistance recommended at discharge: Frequent or constant  Supervision/Assistance SLP Visit Diagnosis: Dysphagia, pharyngeal phase (R13.13) Plan: All goals met;Discharge SLP treatment due to (comment)           Houston Siren  09/17/2021, 10:41 AM

## 2021-09-17 NOTE — TOC Progression Note (Addendum)
Transition of Care [facility]) - Progression Note    Patient Details  Name: Hannah Scott MRN: [id number] Date of Birth: 20-Mar-1951  Transition of Care [facility]) CM/SW [place], [place] Phone Number: 09/17/2021, 8:55 AM  Clinical Narrative:    CSW spoke with pt DIL who would like to get together and speak with her husband and brother in law about a SNF for the pt. CSW will leave a medicare.gov packet in pt room with accepted facilities highlighted to choose from.  CSW spoke with pt son who would like pt to go to [facility]. CSW contacted [place], admissions Arbie Cookey [phone]). No answer CSW left a message.         Expected Discharge Plan and Services                                                 Social Determinants of Health (SDOH) Interventions    Readmission Risk Interventions     View : No data to display.

## 2021-09-17 NOTE — Progress Notes (Signed)
Patient taken off bipap and placed on 3L [place]. Patient able to answer questions and take inhaler. Vitals are stable on 3L.

## 2021-09-17 NOTE — Progress Notes (Signed)
Occupational Therapy Treatment Patient Details Name: Hannah Scott MRN: [id number] DOB: [birth year] Today's Date: 09/17/2021   History of present illness 71 yo female with onset of LLE cellulitis and UTI was admitted on 5/18 through [facility], now to [facility] for tx.  Has septic shock, had a fall on5/18 with poor intake, N&V, pain on R side joints now.  PMHx:  GERD, HTN, HLD, CAD, asthma,   OT comments  Pt making progress with OT goals this session. She is more cognitively aware and following commands this session. Pt is attempting all tasks requested of her, however is very limited by weakness and decreased activity tolerance. Session remained bed level in order for pt to focus on UE ROM, self feeding, and grooming. She quickly fatigues during these tasks and requires rest and assist to complete them. OT will continue to follow acutely and recommend AIR, as pt was completely independent prior to admission.    Recommendations for follow up therapy are one component of a multi-disciplinary discharge planning process, led by the attending physician.  Recommendations may be updated based on patient status, additional functional criteria and insurance authorization.    Follow Up Recommendations  Acute inpatient rehab (3hours/day)    Assistance Recommended at Discharge Frequent or constant Supervision/Assistance  Patient can return home with the following  A lot of help with walking and/or transfers;A lot of help with bathing/dressing/bathroom;Assistance with cooking/housework;Direct supervision/assist for financial management;Direct supervision/assist for medications management;Assist for transportation;Help with stairs or ramp for entrance   Equipment Recommendations  Other (comment) (TBD)    Recommendations for Other Services      Precautions / Restrictions Precautions Precautions: Fall Restrictions Weight Bearing Restrictions: No       Mobility Bed Mobility               General bed  mobility comments: Pt remained bed level this session to focus on self feeding and grooming.    Transfers                         Balance                                           ADL either performed or assessed with clinical judgement   ADL Overall ADL's : Needs assistance/impaired Eating/Feeding: Maximal assistance;Bed level Eating/Feeding Details (indicate cue type and reason): Pt has to ROM to complete self feeding, howerver is very weak and fatigues quickly. She feeds herself 1-2 bites, then requires a break for OT to feed her. Grooming: Wash/dry hands;Wash/dry face;Maximal assistance;Bed level Grooming Details (indicate cue type and reason): Limited by fatigue and weakness, has the ROM to complete needs assist to sustain.                               General ADL Comments: Pt presents with increased weakness and fatigue.    Extremity/Trunk Assessment              Vision       Perception     Praxis      Cognition Arousal/Alertness: Awake/alert Behavior During Therapy: Flat affect Overall Cognitive Status: Impaired/Different from baseline Area of Impairment: Attention, Following commands, Safety/judgement, Awareness, Problem solving  Current Attention Level: Focused   Following Commands: Follows one step commands inconsistently, Follows one step commands with increased time Safety/Judgement: Decreased awareness of safety, Decreased awareness of deficits Awareness: Emergent Problem Solving: Slow processing, Decreased initiation, Difficulty sequencing, Requires verbal cues, Requires tactile cues General Comments: Pt with increased cognition this session, continues to have limited verbal responses, however following 100% of simple commands and Oriented to self, place, and situation        Exercises Exercises: General Upper Extremity General Exercises - Upper Extremity Shoulder Flexion: AAROM,  Both, 5 reps, Supine Elbow Flexion: AAROM, Both, 5 reps, Supine Elbow Extension: AAROM, Both, 5 reps, Supine Digit Composite Flexion: AROM, Both, 10 reps, Supine Composite Extension: AROM, Both, 10 reps, Supine    Shoulder Instructions       General Comments VSS on 2L    Pertinent Vitals/ Pain       Pain Assessment Pain Assessment: Faces Faces Pain Scale: Hurts little more Pain Location: BLE with touch, otherwise intermittent grimacing due to back Pain Descriptors / Indicators: Grimacing, Guarding, Sore Pain Intervention(s): Limited activity within patient's tolerance, Monitored during session, Repositioned  Home Living                                          Prior Functioning/Environment              Frequency  Min 2X/week        Progress Toward Goals  OT Goals(current goals can now be found in the care plan section)  Progress towards OT goals: Progressing toward goals  Acute Rehab OT Goals Patient Stated Goal: none stated this session OT Goal Formulation: With patient Time For Goal Achievement: 09/29/21 Potential to Achieve Goals: Fair ADL Goals Pt Will Perform Grooming: with modified independence;standing Pt Will Perform Lower Body Bathing: with min guard assist;sitting/lateral leans;sit to/from stand Pt Will Perform Lower Body Dressing: with min guard assist;sitting/lateral leans;sit to/from stand Pt Will Transfer to Toilet: with min guard assist;ambulating Pt Will Perform Toileting - Clothing Manipulation and hygiene: with min guard assist;sitting/lateral leans;sit to/from stand Additional ADL Goal #1: Pt will follow 2 step commands with no cuing.  Plan Discharge plan remains appropriate;Frequency remains appropriate    Co-evaluation                 AM-PAC OT "6 Clicks" Daily Activity     Outcome Measure   Help from another person eating meals?: A Lot Help from another person taking care of personal grooming?: A Lot Help  from another person toileting, which includes using toliet, bedpan, or urinal?: Total Help from another person bathing (including washing, rinsing, drying)?: Total Help from another person to put on and taking off regular upper body clothing?: A Lot Help from another person to put on and taking off regular lower body clothing?: Total 6 Click Score: 9    End of Session Equipment Utilized During Treatment: Oxygen  OT Visit Diagnosis: Unsteadiness on feet (R26.81);Other abnormalities of gait and mobility (R26.89);Muscle weakness (generalized) (M62.81)   Activity Tolerance Patient limited by fatigue   Patient Left in bed;with call bell/phone within reach;with bed alarm set   Nurse Communication Mobility status        Time: 5638-9373 OT Time Calculation (min): 36 min  Charges: OT General Charges $OT Visit: 1 Visit OT Treatments $Self Care/Home Management : 23-37 mins  Hannah Lair H., OTR/L Acute  Rehabilitation  Analeigh Aries Elane Yolanda Bonine 09/17/2021, 11:40 AM

## 2021-09-17 NOTE — Progress Notes (Addendum)
RN spoke with pt's daughter in law Lattie Haw ) by phone regarding plan of care. No further questions at this time by daughter in [place] Lattie Haw).

## 2021-09-17 NOTE — Care Management Important Message (Signed)
Important Message  Patient Details  Name: Hannah Scott MRN: [id number] Date of Birth: [birth year]   Medicare Important Message Given:  Yes     Shelda Altes 09/17/2021, 7:48 AM

## 2021-09-17 NOTE — Progress Notes (Signed)
Pt is alert and eating breakfast this morning. Pt is currently on 1.5 L [place] at 100% oxygen saturation.  OT is currently at bedside.

## 2021-09-18 DIAGNOSIS — R6521 Severe sepsis with septic shock: Secondary | ICD-10-CM | POA: Diagnosis not present

## 2021-09-18 DIAGNOSIS — A419 Sepsis, unspecified organism: Secondary | ICD-10-CM | POA: Diagnosis not present

## 2021-09-18 LAB — CBC
HCT: 35.1 % — ABNORMAL LOW (ref 36.0–46.0)
Hemoglobin: 10.6 g/dL — ABNORMAL LOW (ref 12.0–15.0)
MCH: 23 pg — ABNORMAL LOW (ref 26.0–34.0)
MCHC: 30.2 g/dL (ref 30.0–36.0)
MCV: 76.3 fL — ABNORMAL LOW (ref 80.0–100.0)
Platelets: 65 10*3/uL — ABNORMAL LOW (ref 150–400)
RBC: 4.6 MIL/uL (ref 3.87–5.11)
RDW: 21.2 % — ABNORMAL HIGH (ref 11.5–15.5)
WBC: 5.3 10*3/uL (ref 4.0–10.5)
nRBC: 0 % (ref 0.0–0.2)

## 2021-09-18 LAB — COMPREHENSIVE METABOLIC PANEL
ALT: 131 U/L — ABNORMAL HIGH (ref 0–44)
AST: 62 U/L — ABNORMAL HIGH (ref 15–41)
Albumin: 2.1 g/dL — ABNORMAL LOW (ref 3.5–5.0)
Alkaline Phosphatase: 214 U/L — ABNORMAL HIGH (ref 38–126)
Anion gap: 4 — ABNORMAL LOW (ref 5–15)
BUN: 30 mg/dL — ABNORMAL HIGH (ref 8–23)
CO2: 33 mmol/L — ABNORMAL HIGH (ref 22–32)
Calcium: 8.1 mg/dL — ABNORMAL LOW (ref 8.9–10.3)
Chloride: 102 mmol/L (ref 98–111)
Creatinine, Ser: 0.7 mg/dL (ref 0.44–1.00)
GFR, Estimated: >60 mL/min (ref >60)
Glucose, Bld: 89 mg/dL (ref 70–99)
Potassium: 4.2 mmol/L (ref 3.5–5.1)
Sodium: 139 mmol/L (ref 135–145)
Total Bilirubin: 3 mg/dL — ABNORMAL HIGH (ref 0.3–1.2)
Total Protein: 4.1 g/dL — ABNORMAL LOW (ref 6.5–8.1)

## 2021-09-18 LAB — GLUCOSE, CAPILLARY
Glucose-Capillary: 94 mg/dL (ref 70–99)
Glucose-Capillary: 117 mg/dL — ABNORMAL HIGH (ref 70–99)
Glucose-Capillary: 102 mg/dL — ABNORMAL HIGH (ref 70–99)
Glucose-Capillary: 76 mg/dL (ref 70–99)
Glucose-Capillary: 107 mg/dL — ABNORMAL HIGH (ref 70–99)

## 2021-09-18 LAB — PROTIME-INR
INR: 1.2 (ref 0.8–1.2)
Prothrombin Time: 15 seconds (ref 11.4–15.2)

## 2021-09-18 LAB — PROCALCITONIN: Procalcitonin: 0.15 ng/mL

## 2021-09-18 LAB — MAGNESIUM: Magnesium: 2.3 mg/dL (ref 1.7–2.4)

## 2021-09-18 MED ORDER — SODIUM CHLORIDE 0.9% FLUSH
3.0000 mL | Freq: Two times a day (BID) | INTRAVENOUS | Status: DC
  Administered 2021-09-19 – 2021-09-20 (×4): 3 mL via INTRAVENOUS

## 2021-09-18 MED ORDER — FUROSEMIDE 10 MG/ML IJ SOLN
40.0000 mg | Freq: Two times a day (BID) | INTRAMUSCULAR | Status: DC
  Administered 2021-09-18 – 2021-09-19 (×4): 40 mg via INTRAVENOUS
  Filled 2021-09-18 (×4): qty 4

## 2021-09-18 MED ORDER — SODIUM CHLORIDE 0.9 % IV SOLN: 250.0000 mL | INTRAVENOUS | Status: DC | PRN

## 2021-09-18 MED ORDER — SODIUM CHLORIDE 0.9% FLUSH: 3.0000 mL | INTRAVENOUS | Status: DC | PRN

## 2021-09-18 NOTE — Progress Notes (Addendum)
PROGRESS NOTE    Hannah Scott  LSL:[id number] DOB: 1951/02/08 DOA: 09/10/2021 PCP: Binnie Rail, MD   Brief Narrative:  Patient is a 71 year old female with pertinent PMH of HTN, HLD, CAD, asthma presents to the [facility] on 5/18 with septic shock.  Slow to improve.  Hospitalization complicated by shock liver and increasing bilirubin (now decreasing) along with delirium/fatigue  Assessment & Plan:   Principal Problem:   Septic shock ([place]) Active Problems:   Debility   AKI (acute kidney injury) ([place])   Acute cystitis with hematuria   Cellulitis of left lower extremity   Elevated brain natriuretic peptide (BNP) level   NSTEMI (non-ST elevated myocardial infarction) (HCC)  Septic Shock-resolved Cellulitis of LLE-improved UTI with positive BC for 2 of 4 bottles positive for gram negative rods (pending culture) - initially on cefepime and linezolid - culture from OSH Sphingomonas paucimobilis  : change to meropenem - echo results as noted above, consider limited echo with Definity to visualize RV better once more stable - follow cultures: bc x 2, urine culture   NSTEMI Elevated BNP Elevated Troponins on admission  -strict I/o's; daily weights -Heparin discontinued as patient with decreasing plts and nose bleed  -x ray 5/26 shows pleural effusions.  She received low-dose of Lasix 20 mg yesterday.  Since she is still requiring BiPAP at night, I will start her on Lasix 40 mg IV twice daily.  Her renal function is a stable. -check HIT antibody and plts still trending down -bipap PRN, mostly at night.   Elevated LFTs and INR: denies hx of alcohol use; CT abd/pelvis negative Total Bili now trending down -? Related to shock liver -ruq us>>  no acute issues -avoid hepatotoxic medications   AKI -resolved   L airspace opacity: likely due to hiatal hernia Hx of asthma:  take breo-ellipta at home and albuterol Atelectasis on CXR- encourage incentive spirometry New cough>> r/o  aspiration -wean o2 for sats 90-92%.  Currently saturating well over 90% on room air. -Pulm toiletry: IS -prn albuterol for wheezing - Aggressive Pulmonary Toilet  - Swallow Eval by speech>> mech soft diet, aspiration precautions    Hx of MI Hx of HTN, HLD -resumed home ASA -no statin due to liver elevations   Hx of depression -resume home meds   Hx of GERD and hiatal hernia -PPI   Chronic Pain -norco PRN- monitor for sedating effets   Thrombocytopenia D/c'd heparin and monitor closely -We will order HIT panel.  No bleeding.   Hypomagnesemia -replete  Disposition/generalized weakness: Seen by PT OT, they recommended CIR but CIR does not feel that she will be able to tolerate the intensity of CIR at this point in time, [facility] working with the family to find SNF, patient in agreement.  DVT prophylaxis: Place and maintain sequential compression device Start: 09/13/21 1532   Code Status: Partial Code  Family Communication:  None present at bedside.  Plan of care discussed with patient in length and he/she verbalized understanding and agreed with it. D/w her daughter over the phone.  Status is: Inpatient Remains inpatient appropriate because: Pending SNF placement.   Estimated body mass index is 34.79 kg/m as calculated from the following:   Height as of 08/24/21: 5' (1.524 m).   Weight as of this encounter: 80.8 kg.  Pressure Injury 09/10/21 Sacrum Medial Deep Tissue Pressure Injury - Purple or maroon localized area of discolored intact skin or blood-filled blister due to damage of underlying soft tissue from pressure  and/or shear. Purple (Active)  09/10/21 1900  Location: Sacrum  Location Orientation: Medial  Staging: Deep Tissue Pressure Injury - Purple or maroon localized area of discolored intact skin or blood-filled blister due to damage of underlying soft tissue from pressure and/or shear.  Wound Description (Comments): Purple  Present on Admission: Yes  Dressing Type  Foam - Lift dressing to assess site every shift 09/17/21 2236   Nutritional Assessment: Body mass index is 34.79 kg/m.[name] Seen by dietician.  I agree with the assessment and plan as outlined below: Nutrition Status:        . Skin Assessment: I have examined the patient's skin and I agree with the wound assessment as performed by the wound care RN as outlined below: Pressure Injury 09/10/21 Sacrum Medial Deep Tissue Pressure Injury - Purple or maroon localized area of discolored intact skin or blood-filled blister due to damage of underlying soft tissue from pressure and/or shear. Purple (Active)  09/10/21 1900  Location: Sacrum  Location Orientation: Medial  Staging: Deep Tissue Pressure Injury - Purple or maroon localized area of discolored intact skin or blood-filled blister due to damage of underlying soft tissue from pressure and/or shear.  Wound Description (Comments): Purple  Present on Admission: Yes  Dressing Type Foam - Lift dressing to assess site every shift 09/17/21 2236    Consultants:  None  Procedures:  None  Antimicrobials:  Anti-infectives (From admission, onward)    Start     Dose/Rate Route Frequency Ordered Stop   09/14/21 1545  meropenem (MERREM) 1 g in sodium chloride 0.9 % 100 mL IVPB        1 g 200 mL/hr over 30 Minutes Intravenous Every 8 hours 09/14/21 1458     09/10/21 2215  ceFEPIme (MAXIPIME) 2 g in sodium chloride 0.9 % 100 mL IVPB  Status:  Discontinued        2 g 200 mL/hr over 30 Minutes Intravenous Every 12 hours 09/10/21 2118 09/14/21 1458   09/10/21 2215  linezolid (ZYVOX) IVPB 600 mg  Status:  Discontinued        600 mg 300 mL/hr over 60 Minutes Intravenous Every 12 hours 09/10/21 2118 09/14/21 1458         Subjective: Seen and examined.  She has no complaints.  Fully alert and oriented.  She agrees with SNF placement.  Objective: Vitals:   09/18/21 0400 09/18/21 0721 09/18/21 0800 09/18/21 0932  BP: 109/77 112/72 112/72    Pulse:  73 74   Resp: '16 17 19   '$ Temp: 98 F (36.7 C) 97.9 F (36.6 C)    TempSrc: Axillary     SpO2: 100% 100% 100% 95%  Weight: 80.8 kg       Intake/Output Summary (Last 24 hours) at 09/18/2021 1050 Last data filed at 09/18/2021 0900 Gross per 24 hour  Intake 480 ml  Output 575 ml  Net -95 ml   Filed Weights   09/16/21 0437 09/17/21 0348 09/18/21 0400  Weight: 80.4 kg 82.1 kg 80.8 kg    Examination:  General exam: Appears calm and comfortable, obese Respiratory system: Diminished breath sounds bilaterally. Respiratory effort normal. Cardiovascular system: S1 & S2 heard, RRR. No JVD, murmurs, rubs, gallops or clicks.  +2 pitting edema bilateral lower extremity Gastrointestinal system: Abdomen is nondistended, soft and nontender. No organomegaly or masses felt. Normal bowel sounds heard. Central nervous system: Alert and oriented. No focal neurological deficits. Extremities: Symmetric 5 x 5 power. Skin: No rashes, lesions or ulcers  Psychiatry: Judgement and insight appear normal. Mood & affect appropriate.    Data Reviewed: I have personally reviewed following labs and imaging studies  CBC: Recent Labs  Lab 09/12/21 0443 09/13/21 0415 09/14/21 0402 09/15/21 0534 09/16/21 0450 09/17/21 0520 09/18/21 0504  WBC 12.4* 7.2 6.3 6.3 5.9 4.8 5.3  NEUTROABS 10.8* 5.7  --   --   --   --   --   HGB 11.4* 10.8* 11.2* 11.9* 11.1* 10.4* 10.6*  HCT 38.7 35.9* 37.4 37.7 36.1 34.2* 35.1*  MCV 78.2* 76.4* 76.2* 73.6* 74.3* 76.7* 76.3*  PLT 99* 86* 75* 73* 71* 65* 65*   Basic Metabolic Panel: Recent Labs  Lab 09/12/21 0822 09/13/21 0415 09/14/21 0402 09/15/21 0534 09/16/21 0450 09/17/21 0520 09/17/21 1002 09/18/21 0504  NA  --  136 136 136 137 139  --  139  K  --  3.9 3.9 3.8 4.0 4.0  --  4.2  CL  --  102 100 97* 98 99  --  102  CO2  --  '30 31 31 31 '$ 34*  --  33*  GLUCOSE  --  91 95 88 99 113*  --  89  BUN  --  '17 11 14 23 '$ 24*  --  30*  CREATININE  --  0.70 0.64 0.77  0.74 0.61  --  0.70  CALCIUM  --  7.9* 8.2* 8.3* 8.4* 8.4*  --  8.1*  MG 2.0 1.7  --   --  1.8  --  1.4* 2.3   GFR: Estimated Creatinine Clearance: 59.8 mL/min (by C-G formula based on SCr of 0.7 mg/dL). Liver Function Tests: Recent Labs  Lab 09/13/21 0415 09/14/21 0402 09/16/21 0450 09/17/21 0520 09/18/21 0504  AST 151* 129* 74* 49* 62*  ALT 420* 360* 235* 165* 131*  ALKPHOS 109 174* 230* 212* 214*  BILITOT 6.4* 6.9* 6.1* 3.8* 3.0*  PROT 3.9* 4.3* 4.5* 4.0* 4.1*  ALBUMIN 1.8* 2.0* 2.3* 2.1* 2.1*   No results for input(s): LIPASE, AMYLASE in the last 168 hours. Recent Labs  Lab 09/14/21 0400 09/16/21 0450  AMMONIA 24 24   Coagulation Profile: Recent Labs  Lab 09/12/21 0822 09/18/21 0504  INR 1.6* 1.2   Cardiac Enzymes: No results for input(s): CKTOTAL, CKMB, CKMBINDEX, TROPONINI in the last 168 hours. BNP (last 3 results) No results for input(s): PROBNP in the last 8760 hours. HbA1C: No results for input(s): HGBA1C in the last 72 hours. CBG: Recent Labs  Lab 09/17/21 0634 09/17/21 1111 09/17/21 1600 09/17/21 2122 09/18/21 0611  GLUCAP 78 123* 102* 105* 94   Lipid Profile: No results for input(s): CHOL, HDL, LDLCALC, TRIG, CHOLHDL, LDLDIRECT in the last 72 hours. Thyroid Function Tests: No results for input(s): TSH, T4TOTAL, FREET4, T3FREE, THYROIDAB in the last 72 hours. Anemia Panel: No results for input(s): VITAMINB12, FOLATE, FERRITIN, TIBC, IRON, RETICCTPCT in the last 72 hours. Sepsis Labs: Recent Labs  Lab 09/12/21 0443 09/13/21 0415 09/18/21 0720  PROCALCITON 1.71 0.87 0.15  LATICACIDVEN 0.9  --   --     Recent Results (from the past 240 hour(s))  MRSA Next Gen by PCR, Nasal     Status: None   Collection Time: 09/10/21  7:06 PM   Specimen: Nasal Mucosa; Nasal Swab  Result Value Ref Range Status   MRSA by PCR Next Gen NOT DETECTED NOT DETECTED Final    Comment: (NOTE) The GeneXpert MRSA Assay (FDA approved for NASAL specimens only), is  one component of a comprehensive  MRSA colonization surveillance program. It is not intended to diagnose MRSA infection nor to guide or monitor treatment for MRSA infections. Test performance is not FDA approved in patients less than 53 years old. Performed at [facility], [place] [address], [place], [place] [address]   Culture, blood (Routine X 2) w Reflex to ID Panel     Status: None   Collection Time: 09/10/21  9:43 PM   Specimen: BLOOD LEFT HAND  Result Value Ref Range Status   Specimen Description BLOOD LEFT HAND  Final   Special Requests   Final    AEROBIC BOTTLE ONLY Blood Culture results may not be optimal due to an inadequate volume of blood received in culture bottles   Culture   Final    NO GROWTH 5 DAYS Performed at [facility], [place] [address], [place], [place] [address]    Report Status 09/15/2021 FINAL  Final  Culture, blood (Routine X 2) w Reflex to ID Panel     Status: None   Collection Time: 09/10/21  9:56 PM   Specimen: BLOOD LEFT HAND  Result Value Ref Range Status   Specimen Description BLOOD LEFT HAND  Final   Special Requests   Final    AEROBIC BOTTLE ONLY Blood Culture results may not be optimal due to an inadequate volume of blood received in culture bottles   Culture   Final    NO GROWTH 5 DAYS Performed at [facility], [place] [address] [place], [place] [address]    Report Status 09/15/2021 FINAL  Final  Urine Culture     Status: None   Collection Time: 09/10/21 10:15 PM   Specimen: Urine, Clean Catch  Result Value Ref Range Status   Specimen Description URINE, CLEAN CATCH  Final   Special Requests NONE  Final   Culture   Final    NO GROWTH Performed at [facility], [place] [address], [place], [place] [address]    Report Status 09/12/2021 FINAL  Final     Radiology Studies: DG CHEST PORT 1 VIEW  Result Date: 09/17/2021 CLINICAL DATA:  Shortness of breath. EXAM: PORTABLE CHEST 1 VIEW COMPARISON:  Chest radiograph 09/13/2021  FINDINGS: The patient is rotated to the right. A right PICC remains in place with tip not well visualized. The cardiac silhouette remains enlarged. Lung volumes remain low with pulmonary vascular congestion. There is chronic elevation of the right hemidiaphragm. Bibasilar opacities have mildly increased and may reflect a combination of small pleural effusions and atelectasis or consolidation. No pneumothorax is identified. Thoracic scoliosis is noted. The cardiac silhouette remains enlarged. IMPRESSION: Low lung volumes with pulmonary vascular congestion and increased bibasilar opacities which may reflect a combination of small pleural effusions and atelectasis or consolidation. Electronically Signed   By: Logan Bores M.D.   On: 09/17/2021 08:34    Scheduled Meds:  aspirin EC  81 mg Oral Daily   bisoprolol  5 mg Oral Daily   chlorhexidine  15 mL Mouth Rinse BID   Chlorhexidine Gluconate Cloth  6 each Topical Daily   clotrimazole   Topical BID   feeding supplement  237 mL Oral BID BM   furosemide  40 mg Intravenous BID   insulin aspart  0-5 Units Subcutaneous QHS   insulin aspart  0-9 Units Subcutaneous TID WC   levothyroxine  112 mcg Oral Daily   mouth rinse  15 mL Mouth Rinse q12n4p   mometasone-formoterol  2 puff Inhalation  BID   pantoprazole  40 mg Oral Daily   sodium chloride flush  10-40 mL Intracatheter Q12H   sodium chloride flush  3 mL Intravenous Q12H   Continuous Infusions:  sodium chloride 250 mL (09/13/21 0927)   sodium chloride     sodium chloride     meropenem (MERREM) IV 1 g (09/18/21 9629)     LOS: 8 days   Darliss Cheney, MD [facility]  09/18/2021, 10:50 AM   *Please note that this is a verbal dictation therefore any spelling or grammatical errors are due to the "[place] [employer]" system interpretation.  Please page via [place] and do not message via secure chat for urgent patient care matters. Secure chat can be used for non urgent patient care  matters.  How to contact the [facility] Attending or Consulting provider [place] or covering provider during after hours [place], for this patient?  Check the care team in [facility] and look for a) attending/consulting TRH provider listed and b) the [facility] team listed. Page or secure chat [phone]. Log into [email or web address] and use [place] universal password to access. If you do not have the password, please contact the hospital operator. Locate the [facility] provider you are looking for under [facility] and page to a number that you can be directly reached. If you still have difficulty reaching the provider, please page the [facility] (Director on Call) for the Hospitalists listed on amion for assistance.

## 2021-09-18 NOTE — TOC Progression Note (Signed)
Transition of Care [facility]) - Progression Note    Patient Details  Name: Hannah Scott MRN: [id number] Date of Birth: [birth year]  Transition of Care [facility]) CM/SW [place], LCSW Phone Number:[phone] 09/18/2021, 11:25 AM  Clinical Narrative:      CSW contacted [place], admissions Arbie Cookey [phone]). No answer CSW left a message.   TOC team will continue to assist with discharge planning needs.       Expected Discharge Plan and Services                                                 Social Determinants of Health (SDOH) Interventions    Readmission Risk Interventions     View : No data to display.

## 2021-09-19 ENCOUNTER — Inpatient Hospital Stay (HOSPITAL_COMMUNITY): Payer: Medicare Other

## 2021-09-19 DIAGNOSIS — R6521 Severe sepsis with septic shock: Secondary | ICD-10-CM | POA: Diagnosis not present

## 2021-09-19 DIAGNOSIS — A419 Sepsis, unspecified organism: Secondary | ICD-10-CM | POA: Diagnosis not present

## 2021-09-19 LAB — COMPREHENSIVE METABOLIC PANEL
ALT: 105 U/L — ABNORMAL HIGH (ref 0–44)
AST: 50 U/L — ABNORMAL HIGH (ref 15–41)
Albumin: 2 g/dL — ABNORMAL LOW (ref 3.5–5.0)
Alkaline Phosphatase: 191 U/L — ABNORMAL HIGH (ref 38–126)
Anion gap: 6 (ref 5–15)
BUN: 25 mg/dL — ABNORMAL HIGH (ref 8–23)
CO2: 37 mmol/L — ABNORMAL HIGH (ref 22–32)
Calcium: 8.1 mg/dL — ABNORMAL LOW (ref 8.9–10.3)
Chloride: 98 mmol/L (ref 98–111)
Creatinine, Ser: 0.73 mg/dL (ref 0.44–1.00)
GFR, Estimated: >60 mL/min (ref >60)
Glucose, Bld: 102 mg/dL — ABNORMAL HIGH (ref 70–99)
Potassium: 3.7 mmol/L (ref 3.5–5.1)
Sodium: 141 mmol/L (ref 135–145)
Total Bilirubin: 2.1 mg/dL — ABNORMAL HIGH (ref 0.3–1.2)
Total Protein: 3.9 g/dL — ABNORMAL LOW (ref 6.5–8.1)

## 2021-09-19 LAB — CBC WITH DIFFERENTIAL/PLATELET
Abs Immature Granulocytes: 0.14 10*3/uL — ABNORMAL HIGH (ref 0.00–0.07)
Basophils Absolute: 0 10*3/uL (ref 0.0–0.1)
Basophils Relative: 1 %
Eosinophils Absolute: 0.2 10*3/uL (ref 0.0–0.5)
Eosinophils Relative: 4 %
HCT: 34.8 % — ABNORMAL LOW (ref 36.0–46.0)
Hemoglobin: 10.2 g/dL — ABNORMAL LOW (ref 12.0–15.0)
Immature Granulocytes: 2 %
Lymphocytes Relative: 16 %
Lymphs Abs: 0.9 10*3/uL (ref 0.7–4.0)
MCH: 23 pg — ABNORMAL LOW (ref 26.0–34.0)
MCHC: 29.3 g/dL — ABNORMAL LOW (ref 30.0–36.0)
MCV: 78.6 fL — ABNORMAL LOW (ref 80.0–100.0)
Monocytes Absolute: 0.5 10*3/uL (ref 0.1–1.0)
Monocytes Relative: 8 %
Neutro Abs: 4.1 10*3/uL (ref 1.7–7.7)
Neutrophils Relative %: 69 %
Platelets: 68 10*3/uL — ABNORMAL LOW (ref 150–400)
RBC: 4.43 MIL/uL (ref 3.87–5.11)
RDW: 21.1 % — ABNORMAL HIGH (ref 11.5–15.5)
WBC: 5.9 10*3/uL (ref 4.0–10.5)
nRBC: 0.3 % — ABNORMAL HIGH (ref 0.0–0.2)

## 2021-09-19 LAB — GLUCOSE, CAPILLARY
Glucose-Capillary: 105 mg/dL — ABNORMAL HIGH (ref 70–99)
Glucose-Capillary: 97 mg/dL (ref 70–99)
Glucose-Capillary: 114 mg/dL — ABNORMAL HIGH (ref 70–99)
Glucose-Capillary: 97 mg/dL (ref 70–99)
Glucose-Capillary: 92 mg/dL (ref 70–99)

## 2021-09-19 LAB — MAGNESIUM: Magnesium: 1.9 mg/dL (ref 1.7–2.4)

## 2021-09-19 NOTE — Progress Notes (Signed)
PROGRESS NOTE    Hannah Scott  WYO:[id number] DOB: 06-26-1950 DOA: 09/10/2021 PCP: Binnie Rail, MD   Brief Narrative:  Patient is a 71 year old female with pertinent PMH of HTN, HLD, CAD, asthma presents to the [facility] on 5/18 with septic shock.  Slow to improve.  Hospitalization complicated by shock liver and increasing bilirubin (now decreasing) along with delirium/fatigue  Assessment & Plan:   Principal Problem:   Septic shock ([place]) Active Problems:   Debility   AKI (acute kidney injury) ([place])   Acute cystitis with hematuria   Cellulitis of left lower extremity   Elevated brain natriuretic peptide (BNP) level   NSTEMI (non-ST elevated myocardial infarction) (HCC)  Septic Shock-resolved Cellulitis of LLE-improved UTI with positive BC for 2 of 4 bottles positive for gram negative rods (pending culture) - initially on cefepime and linezolid - culture from OSH Sphingomonas paucimobilis  : changed to meropenem, will end on 09/20/2021. - echo results as noted above, consider limited echo with Definity to visualize RV better once more stable.  Blood and urine culture negative.   NSTEMI Elevated BNP Elevated Troponins on admission  -strict I/o's; daily weights -Heparin discontinued as patient with decreasing plts and nose bleed  -x ray 5/26 shows pleural effusions.  Improving.  Currently on room air saturating 97%.  Once again required BiPAP last night but only for 2 to 3 hours instead of whole night.  Much improvement.  We will continue IV Lasix for today and likely switch to oral Lasix tomorrow. -bipap PRN, mostly at night.   Elevated LFTs and INR: denies hx of alcohol use; CT abd/pelvis negative Total Bili now trending down -? Related to shock liver -ruq us>>  no acute issues -avoid hepatotoxic medications   AKI -resolved   L airspace opacity: likely due to hiatal hernia Hx of asthma:  take breo-ellipta at home and albuterol Atelectasis on CXR- encourage  incentive spirometry New cough>> r/o aspiration -wean o2 for sats 90-92%.  Currently saturating well over 90% on room air. -Pulm toiletry: IS -prn albuterol for wheezing - Aggressive Pulmonary Toilet  - Swallow Eval by speech>> mech soft diet, aspiration precautions    Hx of MI Hx of HTN, HLD -resumed home ASA -no statin due to liver elevations   Hx of depression -resume home meds   Hx of GERD and hiatal hernia -PPI   Chronic Pain -norco PRN- monitor for sedating effets   Thrombocytopenia D/c'd heparin and monitor closely -We will order HIT panel.  No bleeding.   Hypomagnesemia -replete  Disposition/generalized weakness: Seen by PT OT, they recommended CIR but CIR does not feel that she will be able to tolerate the intensity of CIR at this point in time, [facility] working with the family to find SNF, patient in agreement.  Had a lengthy discussion over the phone with patient's daughter Lattie Haw yesterday.  The son mentioned that patient was confused when they came to see the patient.  I informed Lattie Haw that when I saw the patient yesterday, patient was fully alert and oriented.  Once again today, patient was fully alert and oriented.  I also discussed with the patient about the concerns raised by her daughter about her confusion.  Patient herself tells me that her daughter sometimes exaggerates.  Nurse tech at the bedside had mentioned that patient was actually sleeping yesterday when the family came and they woke her up from sleep.  No signs of delirium at all.  Patient fully alert and oriented.  DVT prophylaxis: Place and maintain sequential compression device Start: 09/13/21 1532   Code Status: Partial Code  Family Communication:  None present at bedside.  Plan of care discussed with patient in length and he/she verbalized understanding and agreed with it.  Updated her son [name] over the phone.  Status is: Inpatient Remains inpatient appropriate because: Pending SNF  placement.   Estimated body mass index is 34.7 kg/m as calculated from the following:   Height as of 08/24/21: 5' (1.524 m).   Weight as of this encounter: 80.6 kg.  Pressure Injury 09/10/21 Sacrum Medial Deep Tissue Pressure Injury - Purple or maroon localized area of discolored intact skin or blood-filled blister due to damage of underlying soft tissue from pressure and/or shear. Purple (Active)  09/10/21 1900  Location: Sacrum  Location Orientation: Medial  Staging: Deep Tissue Pressure Injury - Purple or maroon localized area of discolored intact skin or blood-filled blister due to damage of underlying soft tissue from pressure and/or shear.  Wound Description (Comments): Purple  Present on Admission: Yes  Dressing Type Foam - Lift dressing to assess site every shift 09/19/21 0800   Nutritional Assessment: Body mass index is 34.7 kg/m.[name] Seen by dietician.  I agree with the assessment and plan as outlined below: Nutrition Status:        . Skin Assessment: I have examined the patient's skin and I agree with the wound assessment as performed by the wound care RN as outlined below: Pressure Injury 09/10/21 Sacrum Medial Deep Tissue Pressure Injury - Purple or maroon localized area of discolored intact skin or blood-filled blister due to damage of underlying soft tissue from pressure and/or shear. Purple (Active)  09/10/21 1900  Location: Sacrum  Location Orientation: Medial  Staging: Deep Tissue Pressure Injury - Purple or maroon localized area of discolored intact skin or blood-filled blister due to damage of underlying soft tissue from pressure and/or shear.  Wound Description (Comments): Purple  Present on Admission: Yes  Dressing Type Foam - Lift dressing to assess site every shift 09/19/21 0800    Consultants:  None  Procedures:  None  Antimicrobials:  Anti-infectives (From admission, onward)    Start     Dose/Rate Route Frequency Ordered Stop   09/14/21 1545   meropenem (MERREM) 1 g in sodium chloride 0.9 % 100 mL IVPB        1 g 200 mL/hr over 30 Minutes Intravenous Every 8 hours 09/14/21 1458     09/10/21 2215  ceFEPIme (MAXIPIME) 2 g in sodium chloride 0.9 % 100 mL IVPB  Status:  Discontinued        2 g 200 mL/hr over 30 Minutes Intravenous Every 12 hours 09/10/21 2118 09/14/21 1458   09/10/21 2215  linezolid (ZYVOX) IVPB 600 mg  Status:  Discontinued        600 mg 300 mL/hr over 60 Minutes Intravenous Every 12 hours 09/10/21 2118 09/14/21 1458         Subjective: Seen and examined.  She has no complaints.  Objective: Vitals:   09/19/21 0555 09/19/21 0742 09/19/21 1053 09/19/21 1132  BP: 100/62 102/65  99/60  Pulse: 90 92  85  Resp: '20 20  19  '$ Temp: 97.7 F (36.5 C) 97.7 F (36.5 C)  97.7 F (36.5 C)  TempSrc: Oral Oral  Oral  SpO2: 100% 97% 92% 97%  Weight:        Intake/Output Summary (Last 24 hours) at 09/19/2021 1344 Last data filed at 09/19/2021 1314 Gross  per 24 hour  Intake 1300 ml  Output 2700 ml  Net -1400 ml    Filed Weights   09/17/21 0348 09/18/21 0400 09/19/21 0111  Weight: 82.1 kg 80.8 kg 80.6 kg    Examination:  General exam: Appears calm and comfortable, obese Respiratory system: Clear to auscultation. Respiratory effort normal. Cardiovascular system: S1 & S2 heard, RRR. No JVD, murmurs, rubs, gallops or clicks. No pedal edema. Gastrointestinal system: Abdomen is nondistended, soft and nontender. No organomegaly or masses felt. Normal bowel sounds heard. Central nervous system: Alert and oriented. No focal neurological deficits. Extremities: Symmetric 5 x 5 power. Skin: No rashes, lesions or ulcers.  Psychiatry: Judgement and insight appear normal. Mood & affect appropriate.    Data Reviewed: I have personally reviewed following labs and imaging studies  CBC: Recent Labs  Lab 09/13/21 0415 09/14/21 0402 09/15/21 0534 09/16/21 0450 09/17/21 0520 09/18/21 0504 09/19/21 0548  WBC 7.2   < >  6.3 5.9 4.8 5.3 5.9  NEUTROABS 5.7  --   --   --   --   --  4.1  HGB 10.8*   < > 11.9* 11.1* 10.4* 10.6* 10.2*  HCT 35.9*   < > 37.7 36.1 34.2* 35.1* 34.8*  MCV 76.4*   < > 73.6* 74.3* 76.7* 76.3* 78.6*  PLT 86*   < > 73* 71* 65* 65* 68*   < > = values in this interval not displayed.    Basic Metabolic Panel: Recent Labs  Lab 09/13/21 0415 09/14/21 0402 09/15/21 0534 09/16/21 0450 09/17/21 0520 09/17/21 1002 09/18/21 0504 09/18/21 2331  NA 136   < > 136 137 139  --  139 141  K 3.9   < > 3.8 4.0 4.0  --  4.2 3.7  CL 102   < > 97* 98 99  --  102 98  CO2 30   < > 31 31 34*  --  33* 37*  GLUCOSE 91   < > 88 99 113*  --  89 102*  BUN 17   < > 14 23 24*  --  30* 25*  CREATININE 0.70   < > 0.77 0.74 0.61  --  0.70 0.73  CALCIUM 7.9*   < > 8.3* 8.4* 8.4*  --  8.1* 8.1*  MG 1.7  --   --  1.8  --  1.4* 2.3 1.9   < > = values in this interval not displayed.    GFR: Estimated Creatinine Clearance: 59.7 mL/min (by C-G formula based on SCr of 0.73 mg/dL). Liver Function Tests: Recent Labs  Lab 09/14/21 0402 09/16/21 0450 09/17/21 0520 09/18/21 0504 09/18/21 2331  AST 129* 74* 49* 62* 50*  ALT 360* 235* 165* 131* 105*  ALKPHOS 174* 230* 212* 214* 191*  BILITOT 6.9* 6.1* 3.8* 3.0* 2.1*  PROT 4.3* 4.5* 4.0* 4.1* 3.9*  ALBUMIN 2.0* 2.3* 2.1* 2.1* 2.0*    No results for input(s): LIPASE, AMYLASE in the last 168 hours. Recent Labs  Lab 09/14/21 0400 09/16/21 0450  AMMONIA 24 24    Coagulation Profile: Recent Labs  Lab 09/18/21 0504  INR 1.2    Cardiac Enzymes: No results for input(s): CKTOTAL, CKMB, CKMBINDEX, TROPONINI in the last 168 hours. BNP (last 3 results) No results for input(s): PROBNP in the last 8760 hours. HbA1C: No results for input(s): HGBA1C in the last 72 hours. CBG: Recent Labs  Lab 09/18/21 2114 09/18/21 2252 09/19/21 0118 09/19/21 0553 09/19/21 1131  GLUCAP 76 107*  105* 97 114*    Lipid Profile: No results for input(s): CHOL, HDL,  LDLCALC, TRIG, CHOLHDL, LDLDIRECT in the last 72 hours. Thyroid Function Tests: No results for input(s): TSH, T4TOTAL, FREET4, T3FREE, THYROIDAB in the last 72 hours. Anemia Panel: No results for input(s): VITAMINB12, FOLATE, FERRITIN, TIBC, IRON, RETICCTPCT in the last 72 hours. Sepsis Labs: Recent Labs  Lab 09/13/21 0415 09/18/21 0720  PROCALCITON 0.87 0.15     Recent Results (from the past 240 hour(s))  MRSA Next Gen by PCR, Nasal     Status: None   Collection Time: 09/10/21  7:06 PM   Specimen: Nasal Mucosa; Nasal Swab  Result Value Ref Range Status   MRSA by PCR Next Gen NOT DETECTED NOT DETECTED Final    Comment: (NOTE) The GeneXpert MRSA Assay (FDA approved for NASAL specimens only), is one component of a comprehensive MRSA colonization surveillance program. It is not intended to diagnose MRSA infection nor to guide or monitor treatment for MRSA infections. Test performance is not FDA approved in patients less than 30 years old. Performed at [facility], [place] [address], [place], [place] [address]   Culture, blood (Routine X 2) w Reflex to ID Panel     Status: None   Collection Time: 09/10/21  9:43 PM   Specimen: BLOOD LEFT HAND  Result Value Ref Range Status   Specimen Description BLOOD LEFT HAND  Final   Special Requests   Final    AEROBIC BOTTLE ONLY Blood Culture results may not be optimal due to an inadequate volume of blood received in culture bottles   Culture   Final    NO GROWTH 5 DAYS Performed at [facility], [place] [address] [place], [place] [address]    Report Status 09/15/2021 FINAL  Final  Culture, blood (Routine X 2) w Reflex to ID Panel     Status: None   Collection Time: 09/10/21  9:56 PM   Specimen: BLOOD LEFT HAND  Result Value Ref Range Status   Specimen Description BLOOD LEFT HAND  Final   Special Requests   Final    AEROBIC BOTTLE ONLY Blood Culture results may not be optimal due to an inadequate volume of blood received in  culture bottles   Culture   Final    NO GROWTH 5 DAYS Performed at [facility], [place] [address] [place], [place] [address]    Report Status 09/15/2021 FINAL  Final  Urine Culture     Status: None   Collection Time: 09/10/21 10:15 PM   Specimen: Urine, Clean Catch  Result Value Ref Range Status   Specimen Description URINE, CLEAN CATCH  Final   Special Requests NONE  Final   Culture   Final    NO GROWTH Performed at [facility], [place] [address] [place], [place] [address]    Report Status 09/12/2021 FINAL  Final      Radiology Studies: DG CHEST PORT 1 VIEW  Result Date: 09/19/2021 CLINICAL DATA:  Community acquired pneumonia. EXAM: PORTABLE CHEST 1 VIEW COMPARISON:  09/17/2021 FINDINGS: Poor inspiration. Grossly normal sized heart with an interval decrease in size. Resolved right basilar airspace opacity. Increased left basilar airspace opacity. Small bilateral pleural effusions, decreased. Right PICC tip in the region of the upper right atrium, approximately 2 cm inferior to the superior cavoatrial junction. Marked dextroconvex thoracolumbar scoliosis. IMPRESSION: 1. Resolved right basilar atelectasis. 2. Mildly decreased left basilar atelectasis and possible pneumonia. 3. Decreased bilateral  pleural fluid. Electronically Signed   By: Claudie Revering M.D.   On: 09/19/2021 10:08    Scheduled Meds:  aspirin EC  81 mg Oral Daily   bisoprolol  5 mg Oral Daily   chlorhexidine  15 mL Mouth Rinse BID   Chlorhexidine Gluconate Cloth  6 each Topical Daily   clotrimazole   Topical BID   feeding supplement  237 mL Oral BID BM   furosemide  40 mg Intravenous BID   insulin aspart  0-5 Units Subcutaneous QHS   insulin aspart  0-9 Units Subcutaneous TID WC   levothyroxine  112 mcg Oral Daily   mouth rinse  15 mL Mouth Rinse q12n4p   mometasone-formoterol  2 puff Inhalation BID   pantoprazole  40 mg Oral Daily   sodium chloride flush  10-40 mL Intracatheter Q12H   sodium chloride  flush  3 mL Intravenous Q12H   Continuous Infusions:  sodium chloride 250 mL (09/13/21 0927)   sodium chloride     sodium chloride     meropenem (MERREM) IV 1 g (09/19/21 0535)     LOS: 9 days   Darliss Cheney, MD [facility]  09/19/2021, 1:44 PM   *Please note that this is a verbal dictation therefore any spelling or grammatical errors are due to the "[employer]" system interpretation.  Please page via [employer] and do not message via secure chat for urgent patient care matters. Secure chat can be used for non urgent patient care matters.  How to contact the [facility] Attending or Consulting provider [place] or covering provider during after hours [place], for this patient?  Check the care team in [facility] and look for a) attending/consulting TRH provider listed and b) the [facility] team listed. Page or secure chat [phone]. Log into [email or web address] and use [place]'s universal password to access. If you do not have the password, please contact the hospital operator. Locate the [facility] provider you are looking for under [facility] and page to a number that you can be directly reached. If you still have difficulty reaching the provider, please page the [facility] (Director on Call) for the Hospitalists listed on amion for assistance.

## 2021-09-19 NOTE — Plan of Care (Signed)

## 2021-09-19 NOTE — Plan of Care (Signed)
  Problem: Education: Goal: Knowledge of General Education information will improve Description Including pain rating scale, medication(s)/side effects and non-pharmacologic comfort measures Outcome: Progressing   Problem: Health Behavior/Discharge Planning: Goal: Ability to manage health-related needs will improve Outcome: Progressing   

## 2021-09-20 DIAGNOSIS — R6521 Severe sepsis with septic shock: Secondary | ICD-10-CM | POA: Diagnosis not present

## 2021-09-20 DIAGNOSIS — A419 Sepsis, unspecified organism: Secondary | ICD-10-CM | POA: Diagnosis not present

## 2021-09-20 LAB — COMPREHENSIVE METABOLIC PANEL
ALT: 92 U/L — ABNORMAL HIGH (ref 0–44)
AST: 55 U/L — ABNORMAL HIGH (ref 15–41)
Albumin: 2.2 g/dL — ABNORMAL LOW (ref 3.5–5.0)
Alkaline Phosphatase: 183 U/L — ABNORMAL HIGH (ref 38–126)
Anion gap: 7 (ref 5–15)
BUN: 22 mg/dL (ref 8–23)
CO2: 37 mmol/L — ABNORMAL HIGH (ref 22–32)
Calcium: 8.3 mg/dL — ABNORMAL LOW (ref 8.9–10.3)
Chloride: 94 mmol/L — ABNORMAL LOW (ref 98–111)
Creatinine, Ser: 0.73 mg/dL (ref 0.44–1.00)
GFR, Estimated: >60 mL/min (ref >60)
Glucose, Bld: 93 mg/dL (ref 70–99)
Potassium: 3.5 mmol/L (ref 3.5–5.1)
Sodium: 138 mmol/L (ref 135–145)
Total Bilirubin: 3.1 mg/dL — ABNORMAL HIGH (ref 0.3–1.2)
Total Protein: 4.3 g/dL — ABNORMAL LOW (ref 6.5–8.1)

## 2021-09-20 LAB — CBC WITH DIFFERENTIAL/PLATELET
Abs Immature Granulocytes: 0.2 10*3/uL — ABNORMAL HIGH (ref 0.00–0.07)
Basophils Absolute: 0.1 10*3/uL (ref 0.0–0.1)
Basophils Relative: 1 %
Eosinophils Absolute: 0.3 10*3/uL (ref 0.0–0.5)
Eosinophils Relative: 3 %
HCT: 36.6 % (ref 36.0–46.0)
Hemoglobin: 11.1 g/dL — ABNORMAL LOW (ref 12.0–15.0)
Immature Granulocytes: 2 %
Lymphocytes Relative: 15 %
Lymphs Abs: 1.3 10*3/uL (ref 0.7–4.0)
MCH: 23.4 pg — ABNORMAL LOW (ref 26.0–34.0)
MCHC: 30.3 g/dL (ref 30.0–36.0)
MCV: 77.2 fL — ABNORMAL LOW (ref 80.0–100.0)
Monocytes Absolute: 0.8 10*3/uL (ref 0.1–1.0)
Monocytes Relative: 9 %
Neutro Abs: 5.9 10*3/uL (ref 1.7–7.7)
Neutrophils Relative %: 70 %
Platelets: 79 10*3/uL — ABNORMAL LOW (ref 150–400)
RBC: 4.74 MIL/uL (ref 3.87–5.11)
RDW: 21 % — ABNORMAL HIGH (ref 11.5–15.5)
WBC: 8.5 10*3/uL (ref 4.0–10.5)
nRBC: 0.2 % (ref 0.0–0.2)

## 2021-09-20 LAB — HEPARIN INDUCED PLATELET AB (HIT ANTIBODY): Heparin Induced Plt Ab: 0.101 OD (ref 0.000–0.400)

## 2021-09-20 LAB — GLUCOSE, CAPILLARY
Glucose-Capillary: 98 mg/dL (ref 70–99)
Glucose-Capillary: 124 mg/dL — ABNORMAL HIGH (ref 70–99)
Glucose-Capillary: 106 mg/dL — ABNORMAL HIGH (ref 70–99)
Glucose-Capillary: 94 mg/dL (ref 70–99)

## 2021-09-20 LAB — MAGNESIUM: Magnesium: 1.6 mg/dL — ABNORMAL LOW (ref 1.7–2.4)

## 2021-09-20 MED ORDER — MAGNESIUM SULFATE 2 GM/50ML IV SOLN
2.0000 g | Freq: Once | INTRAVENOUS | Status: AC
  Administered 2021-09-20: 2 g via INTRAVENOUS
  Filled 2021-09-20: qty 50

## 2021-09-20 MED ORDER — HYDROCODONE-ACETAMINOPHEN 10-325 MG PO TABS
1.0000 | ORAL_TABLET | Freq: Three times a day (TID) | ORAL | Status: DC | PRN
  Administered 2021-09-20 – 2021-09-21 (×2): 1 via ORAL
  Filled 2021-09-20 (×2): qty 1

## 2021-09-20 MED ORDER — FUROSEMIDE 40 MG PO TABS
40.0000 mg | ORAL_TABLET | Freq: Every day | ORAL | Status: DC
  Administered 2021-09-20 – 2021-09-21 (×2): 40 mg via ORAL
  Filled 2021-09-20 (×2): qty 1

## 2021-09-20 NOTE — Progress Notes (Signed)
Occupational Therapy Treatment Patient Details Name: Hannah Scott MRN: [id number] DOB: 1950/12/15 Today's Date: 09/20/2021   History of present illness 71 yo female with onset of LLE cellulitis and UTI was admitted on 5/18 through [facility], now to [facility] for tx.  Has septic shock, had a fall on5/18 with poor intake, N&V, pain on R side joints now.  PMHx:  GERD, HTN, HLD, CAD, asthma,   OT comments  Pt making good improvement with OT goals this session. She tolerated sitting EOB and standing x2 to transfer to recliner. Pt requiring mod A +2 to stand in stedy and transfer to recliner. Requiring max/total assist for pericare during transfer due to pt having difficulty holding herself up in sitting on an unsupported surface. Recommending SNF to maximize her independence. OT will follow acutely.    Recommendations for follow up therapy are one component of a multi-disciplinary discharge planning process, led by the attending physician.  Recommendations may be updated based on patient status, additional functional criteria and insurance authorization.    Follow Up Recommendations  Skilled nursing-short term rehab (<3 hours/day)    Assistance Recommended at Discharge Frequent or constant Supervision/Assistance  Patient can return home with the following  A lot of help with walking and/or transfers;A lot of help with bathing/dressing/bathroom;Assistance with cooking/housework;Direct supervision/assist for financial management;Direct supervision/assist for medications management;Assist for transportation;Help with stairs or ramp for entrance   Equipment Recommendations  None recommended by OT    Recommendations for Other Services      Precautions / Restrictions Precautions Precautions: Fall Precaution Comments: mult open wounds over skin with weeping Restrictions Weight Bearing Restrictions: No       Mobility Bed Mobility Overal bed mobility: Needs Assistance Bed Mobility: Supine to Sit      Supine to sit: Mod assist, HOB elevated     General bed mobility comments: Mod A to bring legs off EOB and scooting forward    Transfers Overall transfer level: Needs assistance Equipment used: Ambulation equipment used Transfers: Sit to/from Stand Sit to Stand: Mod assist, +2 physical assistance, +2 safety/equipment           General transfer comment: Mod A +2 to power up with assist from pad and heavy mod A +2 to sit in low recliner from stedy     Balance Overall balance assessment: Needs assistance Sitting-balance support: Bilateral upper extremity supported, Feet supported Sitting balance-Leahy Scale: Poor Sitting balance - Comments: Leaning on the R heavily, however most like near baseline due to her scoliosis   Standing balance support: Bilateral upper extremity supported, During functional activity Standing balance-Leahy Scale: Poor Standing balance comment: Pt unable to maintain standing without up to max support                           ADL either performed or assessed with clinical judgement   ADL Overall ADL's : Needs assistance/impaired                         Toilet Transfer: Moderate assistance;+2 for physical assistance;+2 for safety/equipment;Stand-pivot (using stedy) Toilet Transfer Details (indicate cue type and reason): Required use of pad under pt topower to standing and stedy to pivot tochair Toileting- Clothing Manipulation and Hygiene: Total assistance;Sitting/lateral lean Toileting - Clothing Manipulation Details (indicate cue type and reason): Unable to hold herself up leaning forward to wipe herself     Functional mobility during ADLs: Moderate assistance;+2 for  safety/equipment;+2 for physical assistance General ADL Comments: Pt using stedy due to increased weakness, participating more this sessino and tolertating transfer    Extremity/Trunk Assessment              Vision       Perception     Praxis       Cognition Arousal/Alertness: Awake/alert Behavior During Therapy: Flat affect Overall Cognitive Status: No family/caregiver present to determine baseline cognitive functioning                                 General Comments: Pt responding more this session, following all commands, at times slow to respond        Exercises      Shoulder Instructions       General Comments O2 on RA 93% at rest, sitting up EOB and transferring pt dropped to 84-85%, placed on 2L, pt maintaining around 94%.    Pertinent Vitals/ Pain       Pain Assessment Pain Assessment: No/denies pain  Home Living                                          Prior Functioning/Environment              Frequency  Min 2X/week        Progress Toward Goals  OT Goals(current goals can now be found in the care plan section)  Progress towards OT goals: Progressing toward goals  Acute Rehab OT Goals Patient Stated Goal: none stated OT Goal Formulation: With patient Time For Goal Achievement: 09/29/21 Potential to Achieve Goals: Fair ADL Goals Pt Will Perform Grooming: with modified independence;standing Pt Will Perform Lower Body Bathing: with min guard assist;sitting/lateral leans;sit to/from stand Pt Will Perform Lower Body Dressing: with min guard assist;sitting/lateral leans;sit to/from stand Pt Will Transfer to Toilet: with min guard assist;ambulating Pt Will Perform Toileting - Clothing Manipulation and hygiene: with min guard assist;sitting/lateral leans;sit to/from stand Additional ADL Goal #1: Pt will follow 2 step commands with no cuing.  Plan Discharge plan remains appropriate;Frequency remains appropriate    Co-evaluation                 AM-PAC OT "6 Clicks" Daily Activity     Outcome Measure   Help from another person eating meals?: A Little Help from another person taking care of personal grooming?: A Lot Help from another person toileting,  which includes using toliet, bedpan, or urinal?: A Lot Help from another person bathing (including washing, rinsing, drying)?: A Lot Help from another person to put on and taking off regular upper body clothing?: A Lot Help from another person to put on and taking off regular lower body clothing?: Total 6 Click Score: 12    End of Session Equipment Utilized During Treatment: Gait belt;Oxygen;Other (comment) (stedy)  OT Visit Diagnosis: Unsteadiness on feet (R26.81);Other abnormalities of gait and mobility (R26.89);Muscle weakness (generalized) (M62.81)   Activity Tolerance Patient tolerated treatment well   Patient Left in chair;with family/visitor present   Nurse Communication Mobility status        Time: 2694-8546 OT Time Calculation (min): 33 min  Charges: OT General Charges $OT Visit: 1 Visit OT Treatments $Self Care/Home Management : 8-22 mins  Verble Styron H., OTR/L Acute Rehabilitation  [place] Paster Elane Samaj W[place]ells 09/20/2021, 1:31 PM

## 2021-09-20 NOTE — Plan of Care (Signed)

## 2021-09-20 NOTE — TOC Progression Note (Addendum)
Transition of Care [facility]) - Progression Note    Patient Details  Name: Hannah Scott MRN: [id number] Date of Birth: 09-Jul-1950  Transition of Care [facility]) CM/SW [place], [place] Phone Number: 09/20/2021, 9:09 AM  Clinical Narrative:     CSW called [facility] regarding bed offer. Secretary informed CSW that admissions staff unavailable due to holiday and unable to follow up until tomorrow.   1405: CSW submitted SNF auth request in navi portal; auth pending.   Expected Discharge Plan: Skilled Nursing Facility Barriers to Discharge: Insurance Authorization  Expected Discharge Plan and Services Expected Discharge Plan: [place]       Living arrangements for the past 2 months: Single Family Home                                       Social Determinants of Health (SDOH) Interventions    Readmission Risk Interventions     View : No data to display.

## 2021-09-20 NOTE — Progress Notes (Signed)
Physical Therapy Treatment Patient Details Name: Hannah Scott MRN: [id number] DOB: January 14, 1951 Today's Date: 09/20/2021   History of Present Illness 71 yo female with onset of LLE cellulitis and UTI was admitted on 5/18 through [facility], now to [facility] for tx.  Has septic shock, had a fall on5/18 with poor intake, N&V, pain on R side joints now.  PMHx:  GERD, HTN, HLD, CAD, asthma,    PT Comments    The pt presents with improved alertness and ability to follow commands. She required only modA to complete transition to sitting in bed, and modA of 2 to complete sit-stand transfers with use of stedy. The pt was able to maintain wt bearing on BLE for up to 30 sec, but is dependent on BUE support as well. Given deconditioning and good family support, continue to recommend acute inpatient rehab at d/c to facilitate functional recovery and maximal independence.    Recommendations for follow up therapy are one component of a multi-disciplinary discharge planning process, led by the attending physician.  Recommendations may be updated based on patient status, additional functional criteria and insurance authorization.  Follow Up Recommendations  Acute inpatient rehab (3hours/day)     Assistance Recommended at Discharge Intermittent Supervision/Assistance  Patient can return home with the following Two people to help with walking and/or transfers;A lot of help with bathing/dressing/bathroom;Assistance with cooking/housework;Assist for transportation;Help with stairs or ramp for entrance   Equipment Recommendations  None recommended by PT    Recommendations for Other Services       Precautions / Restrictions Precautions Precautions: Fall Precaution Comments: mult open wounds over skin with weeping Restrictions Weight Bearing Restrictions: No     Mobility  Bed Mobility Overal bed mobility: Needs Assistance Bed Mobility: Supine to Sit     Supine to sit: Mod assist, HOB elevated      General bed mobility comments: Mod A to bring legs off EOB and scooting forward    Transfers Overall transfer level: Needs assistance Equipment used: Ambulation equipment used Transfers: Sit to/from Stand, Bed to chair/wheelchair/BSC Sit to Stand: Mod assist, +2 physical assistance, +2 safety/equipment           General transfer comment: Mod A +2 to power up with assist from pad and heavy mod A +2 to sit in low recliner from stedy Transfer via Lift Equipment: Stedy    Balance Overall balance assessment: Needs assistance Sitting-balance support: Bilateral upper extremity supported, Feet supported Sitting balance-Leahy Scale: Poor Sitting balance - Comments: Leaning on the R heavily, however most like near baseline due to her scoliosis   Standing balance support: Bilateral upper extremity supported, During functional activity Standing balance-Leahy Scale: Poor Standing balance comment: Pt unable to maintain standing without up to max support                            Cognition Arousal/Alertness: Awake/alert Behavior During Therapy: Flat affect Overall Cognitive Status: Impaired/Different from baseline Area of Impairment: Attention, Following commands, Safety/judgement, Awareness, Problem solving                   Current Attention Level: Focused   Following Commands: Follows one step commands consistently, Follows one step commands with increased time Safety/Judgement: Decreased awareness of safety, Decreased awareness of deficits Awareness: Emergent Problem Solving: Slow processing, Decreased initiation, Difficulty sequencing, Requires verbal cues, Requires tactile cues General Comments: Pt responding more this session, following all commands, at times slow to  respond        Exercises General Exercises - Lower Extremity Long Arc Quad: AROM, Both, 5 reps, Seated    General Comments General comments (skin integrity, edema, etc.): SpO2 93% on RA at  rest, dropped to 84% with sitting EOB and pt placed on 2L      Pertinent Vitals/Pain Pain Assessment Pain Assessment: No/denies pain Faces Pain Scale: Hurts little more Pain Descriptors / Indicators: Grimacing Pain Intervention(s): Monitored during session, Limited activity within patient's tolerance     PT Goals (current goals can now be found in the care plan section) Acute Rehab PT Goals Patient Stated Goal: to get home again PT Goal Formulation: With patient Time For Goal Achievement: 09/28/21 Potential to Achieve Goals: Good Progress towards PT goals: Progressing toward goals    Frequency    Min 3X/week      PT Plan Current plan remains appropriate    Co-evaluation PT/OT/SLP Co-Evaluation/Treatment: Yes Reason for Co-Treatment: Complexity of the patient's impairments (multi-system involvement);Necessary to address cognition/behavior during functional activity;For patient/therapist safety;To address functional/ADL transfers PT goals addressed during session: Mobility/safety with mobility;Balance;Proper use of DME;Strengthening/ROM        AM-PAC PT "6 Clicks" Mobility   Outcome Measure  Help needed turning from your back to your side while in a flat bed without using bedrails?: A Lot Help needed moving from lying on your back to sitting on the side of a flat bed without using bedrails?: A Lot Help needed moving to and from a bed to a chair (including a wheelchair)?: Total Help needed standing up from a chair using your arms (e.g., wheelchair or bedside chair)?: A Lot Help needed to walk in hospital room?: Total Help needed climbing 3-5 steps with a railing? : Total 6 Click Score: 9    End of Session Equipment Utilized During Treatment: Oxygen;Gait belt Activity Tolerance: Patient limited by fatigue;Patient limited by pain;Treatment limited secondary to medical complications (Comment) Patient left: with call bell/phone within reach;in chair;with chair alarm  set;with family/visitor present Nurse Communication: Mobility status;Need for lift equipment PT Visit Diagnosis: Unsteadiness on feet (R26.81);Difficulty in walking, not elsewhere classified (R26.2);Pain Pain - Right/Left: Right Pain - part of body: Arm;Hand;Leg;Ankle and joints of foot     Time: 1138-1209 PT Time Calculation (min) (ACUTE ONLY): 31 min  Charges:  $Therapeutic Exercise: 8-22 mins                     West Carbo, PT, DPT   Acute Rehabilitation Department Pager #: [phone]   Sandra Cockayne 09/20/2021, 2:51 PM

## 2021-09-20 NOTE — Progress Notes (Signed)
PROGRESS NOTE    Hannah Scott  DQQ:[id number] DOB: 09/18/1950 DOA: 09/10/2021 PCP: Binnie Rail, MD   Brief Narrative:  Patient is a 71 year old female with pertinent PMH of HTN, HLD, CAD, asthma presents to the [facility] on 5/18 with septic shock.  Slow to improve.  Hospitalization complicated by shock liver and increasing bilirubin (now decreasing) along with delirium/fatigue  Assessment & Plan:   Principal Problem:   Septic shock ([place]) Active Problems:   Debility   AKI (acute kidney injury) ([place])   Acute cystitis with hematuria   Cellulitis of left lower extremity   Elevated brain natriuretic peptide (BNP) level   NSTEMI (non-ST elevated myocardial infarction) (HCC)  Septic Shock-resolved Cellulitis of LLE-improved UTI with positive BC for 2 of 4 bottles positive for gram negative rods (pending culture) - initially on cefepime and linezolid - culture from OSH Sphingomonas paucimobilis  : changed to meropenem, will end today, will be 7 days. Blood and urine culture negative.   NSTEMI Elevated BNP Elevated Troponins on admission  -strict I/o's; daily weights -Heparin discontinued as patient with decreasing plts and nose bleed  -x ray 5/26 shows pleural effusions.  Improving.  Currently on room air saturating 97%.  She did not require any BiPAP last night.  We will discontinue IV Lasix and start on Lasix p.o. 40 mg daily.    Elevated LFTs and INR: denies hx of alcohol use; CT abd/pelvis negative Total Bili was trending down but slightly up today.  -? Related to shock liver -ruq us>>  no acute issues -avoid hepatotoxic medications   AKI -resolved   L airspace opacity: likely due to hiatal hernia Hx of asthma:  take breo-ellipta at home and albuterol Atelectasis on CXR- encourage incentive spirometry New cough>> r/o aspiration -wean o2 for sats 90-92%.  Currently saturating well over 90% on room air. -Pulm toiletry: IS -prn albuterol for wheezing - Aggressive  Pulmonary Toilet  - Swallow Eval by speech>> mech soft diet, aspiration precautions    Hx of MI Hx of HTN, HLD -resumed home ASA -no statin due to liver elevations   Hx of depression -resume home meds   Hx of GERD and hiatal hernia -PPI   Chronic Pain -norco PRN- monitor for sedating effets   Thrombocytopenia D/c'd heparin and monitor closely.  Platelets are stable with no bleeding.  HIT panel still pending.   Hypomagnesemia [place].  Will replace.  Disposition/generalized weakness: Seen by PT OT, they recommended CIR but CIR does not feel that she will be able to tolerate the intensity of CIR at this point in time, [facility] working with the family to find SNF, patient in agreement.  DVT prophylaxis: Place and maintain sequential compression device Start: 09/13/21 1532   Code Status: Partial Code  Family Communication:  None present at bedside.  Plan of care discussed with patient in length and he/she verbalized understanding and agreed with it.  Updated her son Hannah Scott over the phone.  Status is: Inpatient Remains inpatient appropriate because: Pending SNF placement.  Patient medically stable.   Estimated body mass index is 33.24 kg/m as calculated from the following:   Height as of 08/24/21: 5' (1.524 m).   Weight as of this encounter: 77.2 kg.  Pressure Injury 09/10/21 Sacrum Medial Deep Tissue Pressure Injury - Purple or maroon localized area of discolored intact skin or blood-filled blister due to damage of underlying soft tissue from pressure and/or shear. Purple (Active)  09/10/21 1900  Location: Sacrum  Location  Orientation: Medial  Staging: Deep Tissue Pressure Injury - Purple or maroon localized area of discolored intact skin or blood-filled blister due to damage of underlying soft tissue from pressure and/or shear.  Wound Description (Comments): Purple  Present on Admission: Yes  Dressing Type Foam - Lift dressing to assess site every shift 09/19/21 2010   Nutritional  Assessment: Body mass index is 33.24 kg/m.[name] Seen by dietician.  I agree with the assessment and plan as outlined below: Nutrition Status:        . Skin Assessment: I have examined the patient's skin and I agree with the wound assessment as performed by the wound care RN as outlined below: Pressure Injury 09/10/21 Sacrum Medial Deep Tissue Pressure Injury - Purple or maroon localized area of discolored intact skin or blood-filled blister due to damage of underlying soft tissue from pressure and/or shear. Purple (Active)  09/10/21 1900  Location: Sacrum  Location Orientation: Medial  Staging: Deep Tissue Pressure Injury - Purple or maroon localized area of discolored intact skin or blood-filled blister due to damage of underlying soft tissue from pressure and/or shear.  Wound Description (Comments): Purple  Present on Admission: Yes  Dressing Type Foam - Lift dressing to assess site every shift 09/19/21 2010    Consultants:  None  Procedures:  None  Antimicrobials:  Anti-infectives (From admission, onward)    Start     Dose/Rate Route Frequency Ordered Stop   09/14/21 1545  meropenem (MERREM) 1 g in sodium chloride 0.9 % 100 mL IVPB        1 g 200 mL/hr over 30 Minutes Intravenous Every 8 hours 09/14/21 1458     09/10/21 2215  ceFEPIme (MAXIPIME) 2 g in sodium chloride 0.9 % 100 mL IVPB  Status:  Discontinued        2 g 200 mL/hr over 30 Minutes Intravenous Every 12 hours 09/10/21 2118 09/14/21 1458   09/10/21 2215  linezolid (ZYVOX) IVPB 600 mg  Status:  Discontinued        600 mg 300 mL/hr over 60 Minutes Intravenous Every 12 hours 09/10/21 2118 09/14/21 1458         Subjective: Seen and examined.  No complaints.  Objective: Vitals:   09/20/21 0454 09/20/21 0500 09/20/21 0736 09/20/21 0805  BP: 138/79  (!) 143/76   Pulse: 83  88   Resp: 19  19   Temp: 98.1 F (36.7 C)  97.6 F (36.4 C)   TempSrc: Oral  Oral   SpO2: 91%  92% 92%  Weight:  77.2 kg       Intake/Output Summary (Last 24 hours) at 09/20/2021 0958 Last data filed at 09/20/2021 0906 Gross per 24 hour  Intake 330 ml  Output 2450 ml  Net -2120 ml    Filed Weights   09/18/21 0400 09/19/21 0111 09/20/21 0500  Weight: 80.8 kg 80.6 kg 77.2 kg    Examination:  General exam: Appears calm and comfortable, obese Respiratory system: Clear to auscultation. Respiratory effort normal. Cardiovascular system: S1 & S2 heard, RRR. No JVD, murmurs, rubs, gallops or clicks.  +2 pitting edema bilateral lower extremity. Gastrointestinal system: Abdomen is nondistended, soft and nontender. No organomegaly or masses felt. Normal bowel sounds heard. Central nervous system: Alert and oriented. No focal neurological deficits. Extremities: Symmetric 5 x 5 power. Skin: No rashes, lesions or ulcers.  Psychiatry: Judgement and insight appear normal. Mood & affect appropriate.   Data Reviewed: I have personally reviewed following labs and imaging studies  CBC: Recent Labs  Lab 09/16/21 0450 09/17/21 0520 09/18/21 0504 09/19/21 0548 09/20/21 0630  WBC 5.9 4.8 5.3 5.9 8.5  NEUTROABS  --   --   --  4.1 5.9  HGB 11.1* 10.4* 10.6* 10.2* 11.1*  HCT 36.1 34.2* 35.1* 34.8* 36.6  MCV 74.3* 76.7* 76.3* 78.6* 77.2*  PLT 71* 65* 65* 68* 79*    Basic Metabolic Panel: Recent Labs  Lab 09/16/21 0450 09/17/21 0520 09/17/21 1002 09/18/21 0504 09/18/21 2331 09/20/21 0630  NA 137 139  --  139 141 138  K 4.0 4.0  --  4.2 3.7 3.5  CL 98 99  --  102 98 94*  CO2 31 34*  --  33* 37* 37*  GLUCOSE 99 113*  --  89 102* 93  BUN 23 24*  --  30* 25* 22  CREATININE 0.74 0.61  --  0.70 0.73 0.73  CALCIUM 8.4* 8.4*  --  8.1* 8.1* 8.3*  MG 1.8  --  1.4* 2.3 1.9 1.6*    GFR: Estimated Creatinine Clearance: 58.4 mL/min (by C-G formula based on SCr of 0.73 mg/dL). Liver Function Tests: Recent Labs  Lab 09/16/21 0450 09/17/21 0520 09/18/21 0504 09/18/21 2331 09/20/21 0630  AST 74* 49* 62* 50* 55*   ALT 235* 165* 131* 105* 92*  ALKPHOS 230* 212* 214* 191* 183*  BILITOT 6.1* 3.8* 3.0* 2.1* 3.1*  PROT 4.5* 4.0* 4.1* 3.9* 4.3*  ALBUMIN 2.3* 2.1* 2.1* 2.0* 2.2*    No results for input(s): LIPASE, AMYLASE in the last 168 hours. Recent Labs  Lab 09/14/21 0400 09/16/21 0450  AMMONIA 24 24    Coagulation Profile: Recent Labs  Lab 09/18/21 0504  INR 1.2    Cardiac Enzymes: No results for input(s): CKTOTAL, CKMB, CKMBINDEX, TROPONINI in the last 168 hours. BNP (last 3 results) No results for input(s): PROBNP in the last 8760 hours. HbA1C: No results for input(s): HGBA1C in the last 72 hours. CBG: Recent Labs  Lab 09/19/21 0553 09/19/21 1131 09/19/21 1553 09/19/21 2116 09/20/21 0609  GLUCAP 97 114* 97 92 98    Lipid Profile: No results for input(s): CHOL, HDL, LDLCALC, TRIG, CHOLHDL, LDLDIRECT in the last 72 hours. Thyroid Function Tests: No results for input(s): TSH, T4TOTAL, FREET4, T3FREE, THYROIDAB in the last 72 hours. Anemia Panel: No results for input(s): VITAMINB12, FOLATE, FERRITIN, TIBC, IRON, RETICCTPCT in the last 72 hours. Sepsis Labs: Recent Labs  Lab 09/18/21 0720  PROCALCITON 0.15     Recent Results (from the past 240 hour(s))  MRSA Next Gen by PCR, Nasal     Status: None   Collection Time: 09/10/21  7:06 PM   Specimen: Nasal Mucosa; Nasal Swab  Result Value Ref Range Status   MRSA by PCR Next Gen NOT DETECTED NOT DETECTED Final    Comment: (NOTE) The GeneXpert MRSA Assay (FDA approved for NASAL specimens only), is one component of a comprehensive MRSA colonization surveillance program. It is not intended to diagnose MRSA infection nor to guide or monitor treatment for MRSA infections. Test performance is not FDA approved in patients less than 73 years old. Performed at [facility], [place] [address] [place], [place] [address]   Culture, blood (Routine X 2) w Reflex to ID Panel     Status: None   Collection Time: 09/10/21  9:43 PM    Specimen: BLOOD LEFT HAND  Result Value Ref Range Status   Specimen Description BLOOD LEFT HAND  Final   Special Requests  Final    AEROBIC BOTTLE ONLY Blood Culture results may not be optimal due to an inadequate volume of blood received in culture bottles   Culture   Final    NO GROWTH 5 DAYS Performed at [facility], [place] [address] [place], [place] [address]    Report Status 09/15/2021 FINAL  Final  Culture, blood (Routine X 2) w Reflex to ID Panel     Status: None   Collection Time: 09/10/21  9:56 PM   Specimen: BLOOD LEFT HAND  Result Value Ref Range Status   Specimen Description BLOOD LEFT HAND  Final   Special Requests   Final    AEROBIC BOTTLE ONLY Blood Culture results may not be optimal due to an inadequate volume of blood received in culture bottles   Culture   Final    NO GROWTH 5 DAYS Performed at [facility], [place] [address], [place], [place] [address]    Report Status 09/15/2021 FINAL  Final  Urine Culture     Status: None   Collection Time: 09/10/21 10:15 PM   Specimen: Urine, Clean Catch  Result Value Ref Range Status   Specimen Description URINE, CLEAN CATCH  Final   Special Requests NONE  Final   Culture   Final    NO GROWTH Performed at [facility], [place] [address], [place], [place] [address]    Report Status 09/12/2021 FINAL  Final      Radiology Studies: DG CHEST PORT 1 VIEW  Result Date: 09/19/2021 CLINICAL DATA:  Community acquired pneumonia. EXAM: PORTABLE CHEST 1 VIEW COMPARISON:  09/17/2021 FINDINGS: Poor inspiration. Grossly normal sized heart with an interval decrease in size. Resolved right basilar airspace opacity. Increased left basilar airspace opacity. Small bilateral pleural effusions, decreased. Right PICC tip in the region of the upper right atrium, approximately 2 cm inferior to the superior cavoatrial junction. Marked dextroconvex thoracolumbar scoliosis. IMPRESSION: 1. Resolved right basilar atelectasis. 2. Mildly  decreased left basilar atelectasis and possible pneumonia. 3. Decreased bilateral pleural fluid. Electronically Signed   By: Claudie Revering M.D.   On: 09/19/2021 10:08    Scheduled Meds:  aspirin EC  81 mg Oral Daily   bisoprolol  5 mg Oral Daily   chlorhexidine  15 mL Mouth Rinse BID   Chlorhexidine Gluconate Cloth  6 each Topical Daily   clotrimazole   Topical BID   feeding supplement  237 mL Oral BID BM   furosemide  40 mg Oral Daily   insulin aspart  0-5 Units Subcutaneous QHS   insulin aspart  0-9 Units Subcutaneous TID WC   levothyroxine  112 mcg Oral Daily   mouth rinse  15 mL Mouth Rinse q12n4p   mometasone-formoterol  2 puff Inhalation BID   pantoprazole  40 mg Oral Daily   sodium chloride flush  10-40 mL Intracatheter Q12H   sodium chloride flush  3 mL Intravenous Q12H   Continuous Infusions:  sodium chloride 250 mL (09/13/21 0927)   sodium chloride     sodium chloride     magnesium sulfate bolus IVPB     meropenem (MERREM) IV 1 g (09/20/21 0620)     LOS: 10 days   Darliss Cheney, MD [facility]  09/20/2021, 9:58 AM   *Please note that this is a verbal dictation therefore any spelling or grammatical errors are due to the "[place] [employer]" system interpretation.  Please page via [place] and do not message via secure chat for  urgent patient care matters. Secure chat can be used for non urgent patient care matters.  How to contact the [facility] Attending or Consulting provider [place] or covering provider during after hours [place], for this patient?  Check the care team in [facility] and look for a) attending/consulting [facility] provider listed and b) the [facility] team listed. Page or secure chat [phone]. Log into [email or web address] and use [place]'s universal password to access. If you do not have the password, please contact the hospital operator. Locate the [facility] provider you are looking for under [facility] and page to a number that you can be directly reached. If you still have  difficulty reaching the provider, please page the [facility] (Director on Call) for the Hospitalists listed on amion for assistance.

## 2021-09-21 DIAGNOSIS — E785 Hyperlipidemia, unspecified: Secondary | ICD-10-CM | POA: Diagnosis not present

## 2021-09-21 DIAGNOSIS — E46 Unspecified protein-calorie malnutrition: Secondary | ICD-10-CM | POA: Diagnosis not present

## 2021-09-21 DIAGNOSIS — I214 Non-ST elevation (NSTEMI) myocardial infarction: Secondary | ICD-10-CM | POA: Diagnosis not present

## 2021-09-21 DIAGNOSIS — I361 Nonrheumatic tricuspid (valve) insufficiency: Secondary | ICD-10-CM | POA: Diagnosis not present

## 2021-09-21 DIAGNOSIS — E1169 Type 2 diabetes mellitus with other specified complication: Secondary | ICD-10-CM | POA: Diagnosis not present

## 2021-09-21 DIAGNOSIS — J45909 Unspecified asthma, uncomplicated: Secondary | ICD-10-CM | POA: Diagnosis not present

## 2021-09-21 DIAGNOSIS — I5033 Acute on chronic diastolic (congestive) heart failure: Secondary | ICD-10-CM | POA: Diagnosis not present

## 2021-09-21 DIAGNOSIS — R5381 Other malaise: Secondary | ICD-10-CM | POA: Diagnosis not present

## 2021-09-21 DIAGNOSIS — I252 Old myocardial infarction: Secondary | ICD-10-CM | POA: Diagnosis not present

## 2021-09-21 DIAGNOSIS — M419 Scoliosis, unspecified: Secondary | ICD-10-CM | POA: Diagnosis not present

## 2021-09-21 DIAGNOSIS — A419 Sepsis, unspecified organism: Secondary | ICD-10-CM | POA: Diagnosis not present

## 2021-09-21 DIAGNOSIS — E876 Hypokalemia: Secondary | ICD-10-CM | POA: Diagnosis not present

## 2021-09-21 DIAGNOSIS — I10 Essential (primary) hypertension: Secondary | ICD-10-CM | POA: Diagnosis not present

## 2021-09-21 DIAGNOSIS — I21A1 Myocardial infarction type 2: Secondary | ICD-10-CM | POA: Diagnosis not present

## 2021-09-21 DIAGNOSIS — G9341 Metabolic encephalopathy: Secondary | ICD-10-CM | POA: Diagnosis not present

## 2021-09-21 DIAGNOSIS — J189 Pneumonia, unspecified organism: Secondary | ICD-10-CM | POA: Diagnosis not present

## 2021-09-21 DIAGNOSIS — F339 Major depressive disorder, recurrent, unspecified: Secondary | ICD-10-CM | POA: Diagnosis not present

## 2021-09-21 DIAGNOSIS — R4182 Altered mental status, unspecified: Secondary | ICD-10-CM | POA: Diagnosis not present

## 2021-09-21 DIAGNOSIS — Z888 Allergy status to other drugs, medicaments and biological substances status: Secondary | ICD-10-CM | POA: Diagnosis not present

## 2021-09-21 DIAGNOSIS — J90 Pleural effusion, not elsewhere classified: Secondary | ICD-10-CM | POA: Diagnosis not present

## 2021-09-21 DIAGNOSIS — R0602 Shortness of breath: Secondary | ICD-10-CM | POA: Diagnosis not present

## 2021-09-21 DIAGNOSIS — I2510 Atherosclerotic heart disease of native coronary artery without angina pectoris: Secondary | ICD-10-CM | POA: Diagnosis not present

## 2021-09-21 DIAGNOSIS — E873 Alkalosis: Secondary | ICD-10-CM | POA: Diagnosis not present

## 2021-09-21 DIAGNOSIS — K219 Gastro-esophageal reflux disease without esophagitis: Secondary | ICD-10-CM | POA: Diagnosis not present

## 2021-09-21 DIAGNOSIS — E162 Hypoglycemia, unspecified: Secondary | ICD-10-CM | POA: Diagnosis not present

## 2021-09-21 DIAGNOSIS — R6521 Severe sepsis with septic shock: Secondary | ICD-10-CM | POA: Diagnosis not present

## 2021-09-21 DIAGNOSIS — R7989 Other specified abnormal findings of blood chemistry: Secondary | ICD-10-CM

## 2021-09-21 DIAGNOSIS — Z885 Allergy status to narcotic agent status: Secondary | ICD-10-CM | POA: Diagnosis not present

## 2021-09-21 DIAGNOSIS — Z792 Long term (current) use of antibiotics: Secondary | ICD-10-CM | POA: Diagnosis not present

## 2021-09-21 DIAGNOSIS — Z9981 Dependence on supplemental oxygen: Secondary | ICD-10-CM | POA: Diagnosis not present

## 2021-09-21 DIAGNOSIS — E039 Hypothyroidism, unspecified: Secondary | ICD-10-CM | POA: Diagnosis not present

## 2021-09-21 DIAGNOSIS — R0902 Hypoxemia: Secondary | ICD-10-CM | POA: Diagnosis not present

## 2021-09-21 DIAGNOSIS — L03116 Cellulitis of left lower limb: Secondary | ICD-10-CM | POA: Diagnosis not present

## 2021-09-21 DIAGNOSIS — J9601 Acute respiratory failure with hypoxia: Secondary | ICD-10-CM | POA: Diagnosis not present

## 2021-09-21 DIAGNOSIS — N3090 Cystitis, unspecified without hematuria: Secondary | ICD-10-CM | POA: Diagnosis not present

## 2021-09-21 DIAGNOSIS — I340 Nonrheumatic mitral (valve) insufficiency: Secondary | ICD-10-CM | POA: Diagnosis not present

## 2021-09-21 DIAGNOSIS — E161 Other hypoglycemia: Secondary | ICD-10-CM | POA: Diagnosis not present

## 2021-09-21 DIAGNOSIS — Z9181 History of falling: Secondary | ICD-10-CM | POA: Diagnosis not present

## 2021-09-21 DIAGNOSIS — I272 Pulmonary hypertension, unspecified: Secondary | ICD-10-CM | POA: Diagnosis not present

## 2021-09-21 LAB — CBC WITH DIFFERENTIAL/PLATELET
Abs Immature Granulocytes: 0.24 10*3/uL — ABNORMAL HIGH (ref 0.00–0.07)
Basophils Absolute: 0.1 10*3/uL (ref 0.0–0.1)
Basophils Relative: 1 %
Eosinophils Absolute: 0.4 10*3/uL (ref 0.0–0.5)
Eosinophils Relative: 3 %
HCT: 36.8 % (ref 36.0–46.0)
Hemoglobin: 11.4 g/dL — ABNORMAL LOW (ref 12.0–15.0)
Immature Granulocytes: 2 %
Lymphocytes Relative: 12 %
Lymphs Abs: 1.3 10*3/uL (ref 0.7–4.0)
MCH: 23.7 pg — ABNORMAL LOW (ref 26.0–34.0)
MCHC: 31 g/dL (ref 30.0–36.0)
MCV: 76.5 fL — ABNORMAL LOW (ref 80.0–100.0)
Monocytes Absolute: 0.8 10*3/uL (ref 0.1–1.0)
Monocytes Relative: 8 %
Neutro Abs: 8.1 10*3/uL — ABNORMAL HIGH (ref 1.7–7.7)
Neutrophils Relative %: 74 %
Platelets: 87 10*3/uL — ABNORMAL LOW (ref 150–400)
RBC: 4.81 MIL/uL (ref 3.87–5.11)
RDW: 21.2 % — ABNORMAL HIGH (ref 11.5–15.5)
WBC: 10.9 10*3/uL — ABNORMAL HIGH (ref 4.0–10.5)
nRBC: 0 % (ref 0.0–0.2)

## 2021-09-21 LAB — GLUCOSE, CAPILLARY
Glucose-Capillary: 72 mg/dL (ref 70–99)
Glucose-Capillary: 119 mg/dL — ABNORMAL HIGH (ref 70–99)

## 2021-09-21 MED ORDER — FUROSEMIDE 20 MG PO TABS: 20.0000 mg | ORAL_TABLET | Freq: Every day | ORAL | 0 refills | Status: DC

## 2021-09-21 NOTE — Discharge Summary (Signed)
PatientPhysician Discharge Summary  Hannah Scott:[id number] DOB: Nov 27, 1950 DOA: 09/10/2021  PCP: Binnie Rail, MD  Admit date: 09/10/2021 Discharge date: 09/21/2021 30 Day Unplanned Readmission Risk Score    Flowsheet Row Admission (Current) from 09/10/2021 in [place] [facility]  30 Day Unplanned Readmission Risk Score (%) 13.99 Filed at 09/21/2021 0801       This score is the patient's risk of an unplanned readmission within 30 days of being discharged (0 -100%). The score is based on dignosis, age, lab data, medications, orders, and past utilization.   Low:  0-14.9   Medium: 15-21.9   High: 22-29.9   Extreme: 30 and above          Admitted From: Home Disposition: SNF  Recommendations for Outpatient Follow-up:  Follow up with PCP in 1-2 weeks Please obtain BMP/CBC in one week Please follow up with your PCP on the following pending results: Unresulted Labs (From admission, onward)     Start     Ordered   09/19/21 0500  CBC with Differential/Platelet  Daily,   R     Question:  Specimen collection method  Answer:  Unit=Unit collect   09/18/21 [place]: None Equipment/Devices: None  Discharge Condition: Stable CODE STATUS: Partial code Diet recommendation: Cardiac  Subjective: Seen and examined.  Feels well.  No shortness of breath.  She is ready to go to SNF today.  Brief/Interim Summary: Patient is a 71 year old female with pertinent PMH of HTN, HLD, CAD, asthma presented to the [facility] on 5/18 with septic shock. Patient reported having a fall on 5/18.  Patient complained of right hip pain and right shoulder pain after fall.  Also reports to have N/V/D and poor p.o. intake past 4 days. Physical exam showing BLE 4+ pitting edema and purple discoloration of the fingertips BUE.  LLE warm with erythema and some pus draining from left anterior lower leg.  RLE is cool and pale.  Patient noted to be hypotensive.  Given 1 L LR  without response and was started on levo.  Patient started on Zosyn and vanc.  ED physician at [facility] had goals of care conversation with family and patient states that she would be okay with CPR but does not want to be intubated on a ventilator. On 5/19 PCCM consulted for transfer to [facility] and ICU admission.  She was subsequently transferred under [facility] on the medical floor on 09/13/1921.  Slow to improve.  Hospitalization complicated by shock liver and increasing bilirubin along with delirium/fatigue.  Details below.   Septic Shock-resolved Cellulitis of LLE-improved UTI with positive BC for 2 of 4 bottles positive for gram negative rods (pending culture) - initially on cefepime and linezolid - culture from OSH Sphingomonas paucimobilis  : changed to meropenem, which she received for 7 days and ended on 09/20/2021.  Blood and urine culture negative here.   NSTEMI Elevated BNP Elevated Troponins on admission  -strict I/o's; daily weights -Heparin discontinued as patient with decreasing plts and nose bleed  -x ray 5/26 shows pleural effusions.  Improved.  Currently on room air saturating 97%.  She did not require any BiPAP last 2 nights.  She was transition from IV Lasix to oral Lasix 2 days ago.  She is being discharged on low-dose of Lasix 20 mg p.o. daily for last 2-3 bilateral lower extremity edema.     Elevated LFTs and  INR: denies hx of alcohol use; CT abd/pelvis negative Total Bili was trending down but slightly up yesterday.  -? Related to shock liver -ruq us>>  no acute issues -avoid hepatotoxic medications and for that reason, we are discontinuing her statin, we recommend that her LFTs should be checked in 1 to 2 weeks either at SNF or by PCP and her statin should be resumed if indicated.   AKI -resolved   L airspace opacity: likely due to hiatal hernia Hx of asthma:  take breo-ellipta at home and albuterol Atelectasis on CXR-resolved. New cough>> resolved. Resolved.   Saturating well over 93% on room air for last 3 days now.   Hx of MI Hx of HTN, HLD -resumed home ASA -no statin due to liver elevations   Hx of depression -resume home meds   Hx of GERD and hiatal hernia -PPI   Chronic Pain -norco PRN- monitor for sedating effets   Thrombocytopenia D/c'd heparin and monitor closely. no bleeding.  HIT panel negative.  Platelets improving.   Hypomagnesemia Resolved.   Disposition/generalized weakness: Seen by PT OT, they recommended CIR but CIR does not feel that she will be able to tolerate the intensity of CIR at this point in time, significant arranged and she is being discharged today.  Discharge plan was discussed with patient and/or family member and they verbalized understanding and agreed with it.  Discharge Diagnoses:  Principal Problem:   Septic shock ([place]) Active Problems:   CAD (coronary artery disease)   Debility   Diabetes mellitus without complication (HCC)   GERD (gastroesophageal reflux disease)   AKI (acute kidney injury) ([place])   Acute cystitis with hematuria   Cellulitis of left lower extremity   Elevated brain natriuretic peptide (BNP) level   NSTEMI (non-ST elevated myocardial infarction) (HCC)   Elevated LFTs    Discharge Instructions   Allergies as of 09/21/2021       Reactions   Fluticasone-salmeterol Other (See Comments)   REACTION: headache.  She is tolerating Dulera well. Other reaction(s): Other (See Comments) REACTION: headache.  She is tolerating Dulera well.   Norvasc [amlodipine Besylate] Swelling, Other (See Comments)   edema   Tape Other (See Comments)   Prefers paper tape Other reaction(s): Other (See Comments) Prefers paper tape- "everything else rips my skin"   Heparin    Morphine Nausea Only        Medication List     STOP taking these medications    atorvastatin 20 MG tablet Commonly known as: LIPITOR   HYDROcodone-acetaminophen 10-325 MG tablet Commonly known as: NORCO        TAKE these medications    albuterol 108 (90 Base) MCG/ACT inhaler Commonly known as: VENTOLIN HFA Inhale 2 puffs into the lungs every 4 (four) hours as needed for wheezing or shortness of breath.   aspirin EC 81 MG tablet Take 1 tablet (81 mg total) by mouth daily. Swallow whole.   bisoprolol 5 MG tablet Commonly known as: ZEBETA Take 5 mg by mouth daily.   DULoxetine 30 MG capsule Commonly known as: CYMBALTA TAKE ONE CAPSULE EVERY DAY IN ADDITION TO '60MG'$  CAPSULE FOR A TOTAL OF '90MG'$  DAILY What changed: See the new instructions.   DULoxetine 60 MG capsule Commonly known as: CYMBALTA TAKE ONE CAPSULE BY MOUTH DAILY. TAKE ALONG WITH '30MG'$  CAPSULE. TOTAL DAILY DOSE = 90 MG. What changed: See the new instructions.   famotidine 20 MG tablet Commonly known as: Pepcid One after supper What changed:  how much to take how to take this when to take this additional instructions   fluticasone furoate-vilanterol 100-25 MCG/INH Aepb Commonly known as: Breo Ellipta Inhale 1 puff into the lungs daily.   furosemide 20 MG tablet Commonly known as: Lasix Take 1 tablet (20 mg total) by mouth daily.   gabapentin 600 MG tablet Commonly known as: NEURONTIN TAKE ONE TABLET EVERY DAY AT BEDTIME What changed: See the new instructions.   hydrochlorothiazide 12.5 MG tablet Commonly known as: HYDRODIURIL Take 1 tablet (12.5 mg total) by mouth daily.   ipratropium-albuterol 0.5-2.5 (3) MG/3ML Soln Commonly known as: DUONEB Take 3 mLs by nebulization every 4 (four) hours as needed (wheezing or SOB). During exacerbation   irbesartan 150 MG tablet Commonly known as: AVAPRO Take 150 mg by mouth daily.   levothyroxine 100 MCG tablet Commonly known as: SYNTHROID Take 1 tablet (100 mcg total) by mouth daily.   pantoprazole 40 MG tablet Commonly known as: PROTONIX TAKE 1 TABLET(40 MG) BY MOUTH DAILY 30 TO 60 MINUTES BEFORE FIRST MEAL OF THE DAY   Vitamin D (Ergocalciferol) 1.25 MG  (50000 UNIT) Caps capsule Commonly known as: DRISDOL TAKE ONE CAPSULE BY MOUTH ONCE A WEEK What changed: when to take this        Follow-up Information     Binnie Rail, MD Follow up in 1 week(s).   Specialty: Internal Medicine Contact information: [place] [address] [phone]         Dorothy Spark, MD .   Specialty: Cardiology Contact information: [place] [address] [phone]                Allergies  Allergen Reactions   Fluticasone-Salmeterol Other (See Comments)    REACTION: headache.  She is tolerating Dulera well. Other reaction(s): Other (See Comments) REACTION: headache.  She is tolerating Dulera well.   Norvasc [Amlodipine Besylate] Swelling and Other (See Comments)    edema   Tape Other (See Comments)    Prefers paper tape Other reaction(s): Other (See Comments) Prefers paper tape- "everything else rips my skin"   Heparin    Morphine Nausea Only    Consultations: PCCM   Procedures/Studies: DG CHEST PORT 1 VIEW  Result Date: 09/19/2021 CLINICAL DATA:  Community acquired pneumonia. EXAM: PORTABLE CHEST 1 VIEW COMPARISON:  09/17/2021 FINDINGS: Poor inspiration. Grossly normal sized heart with an interval decrease in size. Resolved right basilar airspace opacity. Increased left basilar airspace opacity. Small bilateral pleural effusions, decreased. Right PICC tip in the region of the upper right atrium, approximately 2 cm inferior to the superior cavoatrial junction. Marked dextroconvex thoracolumbar scoliosis. IMPRESSION: 1. Resolved right basilar atelectasis. 2. Mildly decreased left basilar atelectasis and possible pneumonia. 3. Decreased bilateral pleural fluid. Electronically Signed   By: Claudie Revering M.D.   On: 09/19/2021 10:08   DG CHEST PORT 1 VIEW  Result Date: 09/17/2021 CLINICAL DATA:  Shortness of breath. EXAM: PORTABLE CHEST 1 VIEW COMPARISON:  Chest radiograph  09/13/2021 FINDINGS: The patient is rotated to the right. A right PICC remains in place with tip not well visualized. The cardiac silhouette remains enlarged. Lung volumes remain low with pulmonary vascular congestion. There is chronic elevation of the right hemidiaphragm. Bibasilar opacities have mildly increased and may reflect a combination of small pleural effusions and atelectasis or consolidation. No pneumothorax is identified. Thoracic scoliosis is noted. The cardiac silhouette remains enlarged. IMPRESSION: Low lung volumes with pulmonary vascular congestion  and increased bibasilar opacities which may reflect a combination of small pleural effusions and atelectasis or consolidation. Electronically Signed   By: Logan Bores M.D.   On: 09/17/2021 08:34   DG CHEST PORT 1 VIEW  Result Date: 09/13/2021 CLINICAL DATA:  Dyspnea. EXAM: PORTABLE CHEST 1 VIEW COMPARISON:  09/11/2021. FINDINGS: 5:02 a.m., 09/13/2021. The heart is enlarged. There is interval increased vascular prominence. Lung volumes remain low with increasing left pleural effusion and left lower lobe consolidation or atelectasis. Right lower lung field again obscured by the elevated hemidiaphragm. The upper lung fields are generally clear but there may be a small right pleural effusion. There is a stable mediastinum with aortic atherosclerosis and tortuosity. Severe thoracic dextroscoliosis. IMPRESSION: 1. Increased perihilar vascular congestion. 2. Increasing left pleural fluid and left lower lobe consolidation or atelectasis. Electronically Signed   By: Telford Nab M.D.   On: 09/13/2021 07:06   DG Chest Port 1 View  Result Date: 09/11/2021 CLINICAL DATA:  Opacity of lung on imaging study. EXAM: PORTABLE CHEST 1 VIEW COMPARISON:  PA and lateral 09/01/2020. FINDINGS: 5:41 a.m., 09/11/2021. The lungs are expiratory. There is similar cardiomegaly to the prior study with increased central vascular prominence which could be due to low lung  volumes or perihilar vascular congestion. There is increased opacity in the left low lung field which could be atelectasis or consolidation and a small left pleural effusion appears to have formed. The upper lung fields generally clear with obscuration of the right lower lung field due to an asymmetrically elevated hemidiaphragm. The mediastinum is stable. There is a aortic tortuosity with atherosclerosis. The thoracic cage grossly intact with severe dextroscoliosis of the thoracic spine. IMPRESSION: 1. Limited exam with low inspiration, but there is asymmetric opacity in the left lower zone which is suspected more than typical for simple atelectasis. Consolidation and/or effusion suspected. 2. Cardiomegaly with increased central vascular prominence which could relate to low lung volumes or perihilar vascular congestion. 3. Clinical correlation and radiographic follow-up recommended. Electronically Signed   By: Telford Nab M.D.   On: 09/11/2021 07:48   ECHOCARDIOGRAM COMPLETE  Result Date: 09/11/2021    ECHOCARDIOGRAM REPORT   Patient Name:   ANAILY ASHBAUGH Cliff Date of Exam: 09/11/2021 Medical Rec #:  [id number]   Height:       60.0 in Accession #:    [id number]  Weight:       175.9 lb Date of Birth:  December 01, 1950   BSA:          1.768 m Patient Age:    33 years    BP:           121/74 mmHg Patient Gender: F           HR:           109 bpm. Exam Location:  Inpatient Procedure: 2D Echo, Cardiac Doppler and Color Doppler Indications:    Abnormal ekg  History:        Patient has prior history of Echocardiogram examinations, most                 recent 06/16/2020. CAD, Signs/Symptoms:Chest Pain; Risk                 Factors:Hypertension.  Sonographer:    Joette Catching RCS Referring Phys: [id number] Mick Sell  Sonographer Comments: Image acquisition challenging due to respiratory motion and supine patient. IMPRESSIONS  1. Left ventricular ejection fraction, by estimation, is >75%. The left ventricle has  hyperdynamic  function. The left ventricle has no regional wall motion abnormalities.  2. RV difficult to image In some views, appears dilated with depressed function Would consider limited echo with Definity to visualize RV better and define it's function. Right ventricular systolic function is low normal. The right ventricular size is normal.  3. Left atrial size was small.  4. Right atrial size was severely dilated.  5. The mitral valve is normal in structure. Trivial mitral valve regurgitation.  6. The aortic valve is tricuspid. Aortic valve regurgitation is not visualized.  7. The inferior vena cava is normal in size with greater than 50% respiratory variability, suggesting right atrial pressure of 3 mmHg. FINDINGS  Left Ventricle: Left ventricular ejection fraction, by estimation, is >75%. The left ventricle has hyperdynamic function. The left ventricle has no regional wall motion abnormalities. There is no left ventricular hypertrophy. Right Ventricle: RV difficult to image In some views, appears dilated with depressed function Would consider limited echo with Definity to visualize RV better and define it's function. The right ventricular size is normal. Right vetricular wall thickness  was not assessed. Right ventricular systolic function is low normal. Left Atrium: Left atrial size was small. Right Atrium: Right atrial size was severely dilated. Pericardium: There is no evidence of pericardial effusion. Mitral Valve: The mitral valve is normal in structure. Trivial mitral valve regurgitation. Tricuspid Valve: The tricuspid valve is normal in structure. Tricuspid valve regurgitation is mild. Aortic Valve: The aortic valve is tricuspid. Aortic valve regurgitation is not visualized. Aortic valve mean gradient measures 5.0 mmHg. Aortic valve peak gradient measures 10.8 mmHg. Aortic valve area, by VTI measures 4.17 cm. Pulmonic Valve: The pulmonic valve was normal in structure. Pulmonic valve regurgitation is mild. Aorta:  The aortic root and ascending aorta are structurally normal, with no evidence of dilitation. Venous: The inferior vena cava is normal in size with greater than 50% respiratory variability, suggesting right atrial pressure of 3 mmHg. IAS/Shunts: The interatrial septum was not well visualized.  LEFT VENTRICLE PLAX 2D LVIDd:         3.70 cm   Diastology LVIDs:         2.50 cm   LV e' medial:    7.29 cm/s LV PW:         1.00 cm   LV E/e' medial:  9.1 LV IVS:        1.10 cm   LV e' lateral:   9.90 cm/s LVOT diam:     2.40 cm   LV E/e' lateral: 6.7 LV SV:         95 LV SV Index:   53 LVOT Area:     4.52 cm  RIGHT VENTRICLE             IVC RV Basal diam:  3.90 cm     IVC diam: 1.70 cm RV Mid diam:    3.25 cm RV S prime:     12.70 cm/s TAPSE (M-mode): 2.4 cm LEFT ATRIUM             Index        RIGHT ATRIUM           Index LA diam:        3.70 cm 2.09 cm/m   RA Area:     19.30 cm LA Vol (A2C):   45.7 ml 25.85 ml/m  RA Volume:   50.40 ml  28.51 ml/m LA Vol (A4C):   18.7 ml 10.58 ml/m LA  Biplane Vol: 29.4 ml 16.63 ml/m  AORTIC VALVE                     PULMONIC VALVE AV Area (Vmax):    4.11 cm      PV Vmax:          1.10 m/s AV Area (Vmean):   3.80 cm      PV Peak grad:     4.8 mmHg AV Area (VTI):     4.17 cm      PR End Diast Vel: 4.41 msec AV Vmax:           164.00 cm/s AV Vmean:          113.000 cm/s AV VTI:            0.227 m AV Peak Grad:      10.8 mmHg AV Mean Grad:      5.0 mmHg LVOT Vmax:         149.00 cm/s LVOT Vmean:        95.000 cm/s LVOT VTI:          0.209 m LVOT/AV VTI ratio: 0.92  AORTA Ao Root diam: 2.90 cm Ao Asc diam:  3.50 cm MITRAL VALVE                TRICUSPID VALVE MV Area (PHT): 7.99 cm     TR Peak grad:   62.4 mmHg MV Decel Time: 95 msec      TR Vmax:        395.00 cm/s MV E velocity: 66.70 cm/s MV A velocity: 120.00 cm/s  SHUNTS MV E/A ratio:  0.56         Systemic VTI:  0.21 m                             Systemic Diam: 2.40 cm Dorris Carnes MD Electronically signed by Dorris Carnes MD  Signature Date/Time: 09/11/2021/2:46:43 PM    Final    [place] EKG SITE RITE  Result Date: 09/11/2021 If Site Rite image not attached, placement could not be confirmed due to current cardiac rhythm.  US Abdomen Limited RUQ (LIVER/GB)  Result Date: 09/11/2021 CLINICAL DATA:  Elevated liver enzymes. EXAM: ULTRASOUND ABDOMEN LIMITED RIGHT UPPER QUADRANT COMPARISON:  None Available. FINDINGS: Gallbladder: No gallstones or wall thickening visualized. A tiny non-mobile polyp is seen along the nondependent wall of the gallbladder (no followup imaging recommended) . No sonographic Murphy sign noted by sonographer. Common bile duct: Diameter: 3 mm, within normal limits. Liver: Suboptimal visualization due to patient habitus and inability to cooperate during the exam. No focal lesion identified. Within normal limits in parenchymal echogenicity. Portal vein is patent on color Doppler imaging with normal direction of blood flow towards the liver. Other: None. IMPRESSION: No evidence of cholelithiasis or biliary ductal dilatation. Unremarkable sonographic appearance of the liver. Electronically Signed   By: Marlaine Hind M.D.   On: 09/11/2021 10:37     Discharge Exam: Vitals:   09/21/21 0727 09/21/21 0735  BP: (!) 145/94   Pulse:    Resp: (!) 25   Temp: 97.7 F (36.5 C)   SpO2: 94% 95%   Vitals:   09/21/21 0500 09/21/21 0611 09/21/21 0727 09/21/21 0735  BP:  (!) 136/117 (!) 145/94   Pulse:      Resp:  (!) 25 (!) 25   Temp:  97.6 F (36.4 C) 97.7 F (36.5  C)   TempSrc:  Oral    SpO2:   94% 95%  Weight: 78.1 kg       General: Pt is alert, awake, not in acute distress, obese Cardiovascular: RRR, S1/S2 +, no rubs, no gallops Respiratory: CTA bilaterally, no wheezing, no rhonchi Abdominal: Soft, NT, ND, bowel sounds + Extremities: +2-3 pitting edema bilateral lower extremity no cyanosis    The results of significant diagnostics from this hospitalization (including imaging, microbiology, ancillary  and laboratory) are listed below for reference.     Microbiology: No results found for this or any previous visit (from the past 240 hour(s)).   Labs: BNP (last 3 results) Recent Labs    09/11/21 1008 09/12/21 0443  BNP 899.2* 732.2*   Basic Metabolic Panel: Recent Labs  Lab 09/16/21 0450 09/17/21 0520 09/17/21 1002 09/18/21 0504 09/18/21 2331 09/20/21 0630  NA 137 139  --  139 141 138  K 4.0 4.0  --  4.2 3.7 3.5  CL 98 99  --  102 98 94*  CO2 31 34*  --  33* 37* 37*  GLUCOSE 99 113*  --  89 102* 93  BUN 23 24*  --  30* 25* 22  CREATININE 0.74 0.61  --  0.70 0.73 0.73  CALCIUM 8.4* 8.4*  --  8.1* 8.1* 8.3*  MG 1.8  --  1.4* 2.3 1.9 1.6*   Liver Function Tests: Recent Labs  Lab 09/16/21 0450 09/17/21 0520 09/18/21 0504 09/18/21 2331 09/20/21 0630  AST 74* 49* 62* 50* 55*  ALT 235* 165* 131* 105* 92*  ALKPHOS 230* 212* 214* 191* 183*  BILITOT 6.1* 3.8* 3.0* 2.1* 3.1*  PROT 4.5* 4.0* 4.1* 3.9* 4.3*  ALBUMIN 2.3* 2.1* 2.1* 2.0* 2.2*   No results for input(s): LIPASE, AMYLASE in the last 168 hours. Recent Labs  Lab 09/16/21 0450  AMMONIA 24   CBC: Recent Labs  Lab 09/17/21 0520 09/18/21 0504 09/19/21 0548 09/20/21 0630 09/21/21 0557  WBC 4.8 5.3 5.9 8.5 10.9*  NEUTROABS  --   --  4.1 5.9 8.1*  HGB 10.4* 10.6* 10.2* 11.1* 11.4*  HCT 34.2* 35.1* 34.8* 36.6 36.8  MCV 76.7* 76.3* 78.6* 77.2* 76.5*  PLT 65* 65* 68* 79* 87*   Cardiac Enzymes: No results for input(s): CKTOTAL, CKMB, CKMBINDEX, TROPONINI in the last 168 hours. BNP: Invalid input(s): POCBNP CBG: Recent Labs  Lab 09/20/21 0609 09/20/21 1053 09/20/21 1600 09/20/21 2108 09/21/21 0618  GLUCAP 98 124* 106* 94 72   D-Dimer No results for input(s): DDIMER in the last 72 hours. Hgb A1c No results for input(s): HGBA1C in the last 72 hours. Lipid Profile No results for input(s): CHOL, HDL, LDLCALC, TRIG, CHOLHDL, LDLDIRECT in the last 72 hours. Thyroid function studies No results for  input(s): TSH, T4TOTAL, T3FREE, THYROIDAB in the last 72 hours.  Invalid input(s): FREET3 Anemia work up No results for input(s): VITAMINB12, FOLATE, FERRITIN, TIBC, IRON, RETICCTPCT in the last 72 hours. Urinalysis    Component Value Date/Time   COLORURINE DARK YELLOW 09/01/2015 1408   APPEARANCEUR CLEAR 09/01/2015 1408   LABSPEC 1.021 09/01/2015 1408   PHURINE 7.0 09/01/2015 1408   GLUCOSEU NEGATIVE 09/01/2015 1408   HGBUR NEGATIVE 09/01/2015 1408   BILIRUBINUR NEGATIVE 09/01/2015 1408   KETONESUR NEGATIVE 09/01/2015 1408   PROTEINUR NEGATIVE 09/01/2015 1408   UROBILINOGEN 1.0 10/23/2013 1531   NITRITE NEGATIVE 09/01/2015 1408   LEUKOCYTESUR 2+ (A) 09/01/2015 1408   Sepsis Labs Invalid input(s): PROCALCITONIN,  WBC,  LACTICIDVEN  Microbiology No results found for this or any previous visit (from the past 240 hour(s)).   Time coordinating discharge: Over 30 minutes  SIGNED:   Darliss Cheney, MD  [facility] 09/21/2021, 10:21 AM *Please note that this is a verbal dictation therefore any spelling or grammatical errors are due to the "[place] [employer]" system interpretation. If 7PM-7AM, please contact night-coverage [email or web address]

## 2021-09-21 NOTE — Consult Note (Signed)
[facility] CM Inpatient Consult  09/21/2021  MICHELLA DETJEN [birth year] [id number]  [place] [facility] CM)   Patient chart has been reviewed with noted high risk score for unplanned readmissions.  Patient assessed for community [place] Management follow up needs. Per review, current recommendation is for SNF. No [facility] CM needs.   Of note, [facility] Care Management services does not replace or interfere with any services that are arranged by inpatient case management or social work.    Netta Cedars, MSN, RN [facility] Liaison Toll free office [phone]

## 2021-09-21 NOTE — TOC Transition Note (Signed)
Transition of Care [facility]) - CM/SW Discharge Note   Patient Details  Name: Hannah Scott MRN: [id number] Date of Birth: [birth year]  Transition of Care [facility]) CM/SW Contact:  Bethann Berkshire, Williams Phone Number: 09/21/2021, 11:03 AM   Clinical Narrative:     SNF auth approved 5/30 - 6/1 [id number]  Patient will DC to: [facility]  Anticipated DC date: 09/21/21 Family notified: Son [name] Transport by: Corey Harold   Per MD patient ready for DC to [facility]. RN, patient, patient's family, and facility notified of DC. Discharge Summary and FL2 sent to facility. RN to call report prior to discharge [phone]). DC packet on chart. Ambulance transport requested for patient.   CSW will sign off for now as social work intervention is no longer needed. Please consult [place] again if new needs arise.   Final next level of care: Skilled Nursing Facility Barriers to Discharge: No Barriers Identified   Patient Goals and CMS Choice        Discharge Placement              Patient chooses bed at: [facility] Patient to be transferred to facility by: [facility] [facility] Name of family member notified: Reinaldo Meeker Patient and family notified of of transfer: 09/21/21  Discharge Plan and Services                                     Social Determinants of Health (SDOH) Interventions     Readmission Risk Interventions     View : No data to display.

## 2021-09-22 DIAGNOSIS — R5381 Other malaise: Secondary | ICD-10-CM | POA: Diagnosis not present

## 2021-09-22 DIAGNOSIS — E785 Hyperlipidemia, unspecified: Secondary | ICD-10-CM | POA: Diagnosis not present

## 2021-09-22 DIAGNOSIS — F339 Major depressive disorder, recurrent, unspecified: Secondary | ICD-10-CM | POA: Diagnosis not present

## 2021-09-22 DIAGNOSIS — E1169 Type 2 diabetes mellitus with other specified complication: Secondary | ICD-10-CM | POA: Diagnosis not present

## 2021-09-25 ENCOUNTER — Encounter: Payer: Self-pay | Admitting: Cardiology

## 2021-09-25 DIAGNOSIS — R5381 Other malaise: Secondary | ICD-10-CM | POA: Diagnosis not present

## 2021-09-25 DIAGNOSIS — I252 Old myocardial infarction: Secondary | ICD-10-CM | POA: Diagnosis not present

## 2021-09-25 DIAGNOSIS — J45909 Unspecified asthma, uncomplicated: Secondary | ICD-10-CM | POA: Diagnosis not present

## 2021-09-25 DIAGNOSIS — R279 Unspecified lack of coordination: Secondary | ICD-10-CM | POA: Diagnosis not present

## 2021-09-25 DIAGNOSIS — Z7401 Bed confinement status: Secondary | ICD-10-CM | POA: Diagnosis not present

## 2021-09-25 DIAGNOSIS — R0600 Dyspnea, unspecified: Secondary | ICD-10-CM | POA: Diagnosis not present

## 2021-09-25 DIAGNOSIS — E876 Hypokalemia: Secondary | ICD-10-CM | POA: Diagnosis not present

## 2021-09-25 DIAGNOSIS — J189 Pneumonia, unspecified organism: Secondary | ICD-10-CM | POA: Diagnosis not present

## 2021-09-25 DIAGNOSIS — I5033 Acute on chronic diastolic (congestive) heart failure: Secondary | ICD-10-CM | POA: Diagnosis not present

## 2021-09-25 DIAGNOSIS — I361 Nonrheumatic tricuspid (valve) insufficiency: Secondary | ICD-10-CM | POA: Diagnosis not present

## 2021-09-25 DIAGNOSIS — Z9981 Dependence on supplemental oxygen: Secondary | ICD-10-CM | POA: Diagnosis not present

## 2021-09-25 DIAGNOSIS — J90 Pleural effusion, not elsewhere classified: Secondary | ICD-10-CM | POA: Diagnosis not present

## 2021-09-25 DIAGNOSIS — L03116 Cellulitis of left lower limb: Secondary | ICD-10-CM | POA: Diagnosis not present

## 2021-09-25 DIAGNOSIS — Z888 Allergy status to other drugs, medicaments and biological substances status: Secondary | ICD-10-CM | POA: Diagnosis not present

## 2021-09-25 DIAGNOSIS — I214 Non-ST elevation (NSTEMI) myocardial infarction: Secondary | ICD-10-CM | POA: Diagnosis not present

## 2021-09-25 DIAGNOSIS — E161 Other hypoglycemia: Secondary | ICD-10-CM | POA: Diagnosis not present

## 2021-09-25 DIAGNOSIS — Z9181 History of falling: Secondary | ICD-10-CM | POA: Diagnosis not present

## 2021-09-25 DIAGNOSIS — J9601 Acute respiratory failure with hypoxia: Secondary | ICD-10-CM | POA: Diagnosis not present

## 2021-09-25 DIAGNOSIS — R4182 Altered mental status, unspecified: Secondary | ICD-10-CM | POA: Diagnosis not present

## 2021-09-25 DIAGNOSIS — J9690 Respiratory failure, unspecified, unspecified whether with hypoxia or hypercapnia: Secondary | ICD-10-CM | POA: Diagnosis not present

## 2021-09-25 DIAGNOSIS — I272 Pulmonary hypertension, unspecified: Secondary | ICD-10-CM | POA: Diagnosis not present

## 2021-09-25 DIAGNOSIS — R6521 Severe sepsis with septic shock: Secondary | ICD-10-CM | POA: Diagnosis not present

## 2021-09-25 DIAGNOSIS — K219 Gastro-esophageal reflux disease without esophagitis: Secondary | ICD-10-CM | POA: Diagnosis not present

## 2021-09-25 DIAGNOSIS — M419 Scoliosis, unspecified: Secondary | ICD-10-CM | POA: Diagnosis not present

## 2021-09-25 DIAGNOSIS — I2510 Atherosclerotic heart disease of native coronary artery without angina pectoris: Secondary | ICD-10-CM | POA: Diagnosis not present

## 2021-09-25 DIAGNOSIS — J9811 Atelectasis: Secondary | ICD-10-CM | POA: Diagnosis not present

## 2021-09-25 DIAGNOSIS — E785 Hyperlipidemia, unspecified: Secondary | ICD-10-CM | POA: Diagnosis not present

## 2021-09-25 DIAGNOSIS — E1169 Type 2 diabetes mellitus with other specified complication: Secondary | ICD-10-CM | POA: Diagnosis not present

## 2021-09-25 DIAGNOSIS — N3090 Cystitis, unspecified without hematuria: Secondary | ICD-10-CM | POA: Diagnosis not present

## 2021-09-25 DIAGNOSIS — I21A1 Myocardial infarction type 2: Secondary | ICD-10-CM | POA: Diagnosis not present

## 2021-09-25 DIAGNOSIS — R0602 Shortness of breath: Secondary | ICD-10-CM | POA: Diagnosis not present

## 2021-09-25 DIAGNOSIS — E873 Alkalosis: Secondary | ICD-10-CM | POA: Diagnosis not present

## 2021-09-25 DIAGNOSIS — E039 Hypothyroidism, unspecified: Secondary | ICD-10-CM | POA: Diagnosis not present

## 2021-09-25 DIAGNOSIS — E46 Unspecified protein-calorie malnutrition: Secondary | ICD-10-CM | POA: Diagnosis not present

## 2021-09-25 DIAGNOSIS — I340 Nonrheumatic mitral (valve) insufficiency: Secondary | ICD-10-CM | POA: Diagnosis not present

## 2021-09-25 DIAGNOSIS — G9341 Metabolic encephalopathy: Secondary | ICD-10-CM | POA: Diagnosis not present

## 2021-09-25 DIAGNOSIS — Z885 Allergy status to narcotic agent status: Secondary | ICD-10-CM | POA: Diagnosis not present

## 2021-09-25 DIAGNOSIS — E162 Hypoglycemia, unspecified: Secondary | ICD-10-CM | POA: Diagnosis not present

## 2021-09-25 DIAGNOSIS — I10 Essential (primary) hypertension: Secondary | ICD-10-CM | POA: Diagnosis not present

## 2021-09-25 DIAGNOSIS — A419 Sepsis, unspecified organism: Secondary | ICD-10-CM | POA: Diagnosis not present

## 2021-09-25 DIAGNOSIS — R0902 Hypoxemia: Secondary | ICD-10-CM | POA: Diagnosis not present

## 2021-09-25 DIAGNOSIS — F339 Major depressive disorder, recurrent, unspecified: Secondary | ICD-10-CM | POA: Diagnosis not present

## 2021-09-25 DIAGNOSIS — Z792 Long term (current) use of antibiotics: Secondary | ICD-10-CM | POA: Diagnosis not present

## 2021-09-28 ENCOUNTER — Other Ambulatory Visit: Payer: Self-pay | Admitting: Registered Nurse

## 2021-10-07 DIAGNOSIS — Z7401 Bed confinement status: Secondary | ICD-10-CM | POA: Diagnosis not present

## 2021-10-07 DIAGNOSIS — E785 Hyperlipidemia, unspecified: Secondary | ICD-10-CM | POA: Diagnosis not present

## 2021-10-07 DIAGNOSIS — N3090 Cystitis, unspecified without hematuria: Secondary | ICD-10-CM | POA: Diagnosis not present

## 2021-10-07 DIAGNOSIS — J9610 Chronic respiratory failure, unspecified whether with hypoxia or hypercapnia: Secondary | ICD-10-CM | POA: Diagnosis not present

## 2021-10-07 DIAGNOSIS — A419 Sepsis, unspecified organism: Secondary | ICD-10-CM | POA: Diagnosis not present

## 2021-10-07 DIAGNOSIS — E039 Hypothyroidism, unspecified: Secondary | ICD-10-CM | POA: Diagnosis not present

## 2021-10-07 DIAGNOSIS — I214 Non-ST elevation (NSTEMI) myocardial infarction: Secondary | ICD-10-CM | POA: Diagnosis not present

## 2021-10-07 DIAGNOSIS — J45909 Unspecified asthma, uncomplicated: Secondary | ICD-10-CM | POA: Diagnosis not present

## 2021-10-07 DIAGNOSIS — E46 Unspecified protein-calorie malnutrition: Secondary | ICD-10-CM | POA: Diagnosis not present

## 2021-10-07 DIAGNOSIS — I21A1 Myocardial infarction type 2: Secondary | ICD-10-CM | POA: Diagnosis not present

## 2021-10-07 DIAGNOSIS — Z9181 History of falling: Secondary | ICD-10-CM | POA: Diagnosis not present

## 2021-10-07 DIAGNOSIS — R5381 Other malaise: Secondary | ICD-10-CM | POA: Diagnosis not present

## 2021-10-07 DIAGNOSIS — I5032 Chronic diastolic (congestive) heart failure: Secondary | ICD-10-CM | POA: Diagnosis not present

## 2021-10-07 DIAGNOSIS — G9341 Metabolic encephalopathy: Secondary | ICD-10-CM | POA: Diagnosis not present

## 2021-10-07 DIAGNOSIS — K219 Gastro-esophageal reflux disease without esophagitis: Secondary | ICD-10-CM | POA: Diagnosis not present

## 2021-10-07 DIAGNOSIS — R279 Unspecified lack of coordination: Secondary | ICD-10-CM | POA: Diagnosis not present

## 2021-10-07 DIAGNOSIS — M419 Scoliosis, unspecified: Secondary | ICD-10-CM | POA: Diagnosis not present

## 2021-10-07 DIAGNOSIS — I10 Essential (primary) hypertension: Secondary | ICD-10-CM | POA: Diagnosis not present

## 2021-10-07 DIAGNOSIS — E1169 Type 2 diabetes mellitus with other specified complication: Secondary | ICD-10-CM | POA: Diagnosis not present

## 2021-10-07 DIAGNOSIS — I2510 Atherosclerotic heart disease of native coronary artery without angina pectoris: Secondary | ICD-10-CM | POA: Diagnosis not present

## 2021-10-07 DIAGNOSIS — F339 Major depressive disorder, recurrent, unspecified: Secondary | ICD-10-CM | POA: Diagnosis not present

## 2021-10-07 DIAGNOSIS — L03116 Cellulitis of left lower limb: Secondary | ICD-10-CM | POA: Diagnosis not present

## 2021-10-12 DIAGNOSIS — E46 Unspecified protein-calorie malnutrition: Secondary | ICD-10-CM | POA: Diagnosis not present

## 2021-10-12 DIAGNOSIS — I2510 Atherosclerotic heart disease of native coronary artery without angina pectoris: Secondary | ICD-10-CM | POA: Diagnosis not present

## 2021-10-12 DIAGNOSIS — R5381 Other malaise: Secondary | ICD-10-CM | POA: Diagnosis not present

## 2021-10-12 DIAGNOSIS — J45909 Unspecified asthma, uncomplicated: Secondary | ICD-10-CM | POA: Diagnosis not present

## 2021-10-15 ENCOUNTER — Ambulatory Visit: Payer: Medicare Other | Admitting: Physician Assistant

## 2021-10-15 ENCOUNTER — Encounter: Payer: Self-pay | Admitting: Physician Assistant

## 2021-10-15 VITALS — BP 130/80 | HR 81 | Ht 60.0 in | Wt 139.8 lb

## 2021-10-15 DIAGNOSIS — I5032 Chronic diastolic (congestive) heart failure: Secondary | ICD-10-CM

## 2021-10-15 DIAGNOSIS — J9610 Chronic respiratory failure, unspecified whether with hypoxia or hypercapnia: Secondary | ICD-10-CM | POA: Diagnosis not present

## 2021-10-15 DIAGNOSIS — R0609 Other forms of dyspnea: Secondary | ICD-10-CM | POA: Diagnosis not present

## 2021-10-15 DIAGNOSIS — I2510 Atherosclerotic heart disease of native coronary artery without angina pectoris: Secondary | ICD-10-CM

## 2021-10-15 DIAGNOSIS — I10 Essential (primary) hypertension: Secondary | ICD-10-CM

## 2021-10-20 DIAGNOSIS — J449 Chronic obstructive pulmonary disease, unspecified: Secondary | ICD-10-CM | POA: Diagnosis not present

## 2021-10-20 DIAGNOSIS — I5020 Unspecified systolic (congestive) heart failure: Secondary | ICD-10-CM | POA: Diagnosis not present

## 2021-10-21 ENCOUNTER — Telehealth: Payer: Self-pay | Admitting: Internal Medicine

## 2021-10-21 DIAGNOSIS — I2510 Atherosclerotic heart disease of native coronary artery without angina pectoris: Secondary | ICD-10-CM | POA: Diagnosis not present

## 2021-10-21 DIAGNOSIS — Z7982 Long term (current) use of aspirin: Secondary | ICD-10-CM | POA: Diagnosis not present

## 2021-10-21 DIAGNOSIS — E785 Hyperlipidemia, unspecified: Secondary | ICD-10-CM | POA: Diagnosis not present

## 2021-10-21 DIAGNOSIS — Z9981 Dependence on supplemental oxygen: Secondary | ICD-10-CM | POA: Diagnosis not present

## 2021-10-21 DIAGNOSIS — Z7951 Long term (current) use of inhaled steroids: Secondary | ICD-10-CM | POA: Diagnosis not present

## 2021-10-21 DIAGNOSIS — R5381 Other malaise: Secondary | ICD-10-CM | POA: Diagnosis not present

## 2021-10-21 DIAGNOSIS — E46 Unspecified protein-calorie malnutrition: Secondary | ICD-10-CM | POA: Diagnosis not present

## 2021-10-21 DIAGNOSIS — I10 Essential (primary) hypertension: Secondary | ICD-10-CM | POA: Diagnosis not present

## 2021-10-21 DIAGNOSIS — F339 Major depressive disorder, recurrent, unspecified: Secondary | ICD-10-CM | POA: Diagnosis not present

## 2021-10-21 DIAGNOSIS — J45909 Unspecified asthma, uncomplicated: Secondary | ICD-10-CM | POA: Diagnosis not present

## 2021-10-21 DIAGNOSIS — E1169 Type 2 diabetes mellitus with other specified complication: Secondary | ICD-10-CM | POA: Diagnosis not present

## 2021-10-21 DIAGNOSIS — Z79891 Long term (current) use of opiate analgesic: Secondary | ICD-10-CM | POA: Diagnosis not present

## 2021-10-21 NOTE — Telephone Encounter (Signed)
Hannah Scott is requesting Verbal Order for skilled nursing for [name] 1 time a week for 9 weeks.   Please advise   CB: [phone]

## 2021-10-22 DIAGNOSIS — I5020 Unspecified systolic (congestive) heart failure: Secondary | ICD-10-CM | POA: Diagnosis not present

## 2021-10-22 DIAGNOSIS — J449 Chronic obstructive pulmonary disease, unspecified: Secondary | ICD-10-CM | POA: Diagnosis not present

## 2021-10-22 NOTE — Telephone Encounter (Signed)
I don't see appropriate visit in timeframe. Needs visit within 2 weeks of start date for verbals. Okay to make visit and do verbals.

## 2021-10-27 ENCOUNTER — Telehealth: Payer: Self-pay | Admitting: Internal Medicine

## 2021-10-27 NOTE — Telephone Encounter (Signed)
Pt called in to report that her BP has been running low today.   The last reading was 85/58.  Requesting a call back from assistant.

## 2021-10-27 NOTE — Telephone Encounter (Signed)
Pt has an appt scheduled with her pcp 11/15/2021

## 2021-10-27 NOTE — Telephone Encounter (Signed)
Message left for patient to return call to clinic to discuss low BP.  Her call back number is now [phone].

## 2021-10-28 NOTE — Telephone Encounter (Signed)
Message left today for patient.  If she calls back please see when she can come in for BP check and place her on the nurse schedule.  Thanks

## 2021-10-29 ENCOUNTER — Encounter: Payer: Self-pay | Admitting: Internal Medicine

## 2021-10-29 ENCOUNTER — Ambulatory Visit (INDEPENDENT_AMBULATORY_CARE_PROVIDER_SITE_OTHER): Payer: Medicare Other | Admitting: Internal Medicine

## 2021-10-29 ENCOUNTER — Ambulatory Visit: Payer: Medicare Other

## 2021-10-29 VITALS — BP 108/64 | HR 100 | Temp 97.8°F | Resp 16 | Wt 140.0 lb

## 2021-10-29 DIAGNOSIS — I517 Cardiomegaly: Secondary | ICD-10-CM

## 2021-10-29 DIAGNOSIS — I10 Essential (primary) hypertension: Secondary | ICD-10-CM | POA: Diagnosis not present

## 2021-10-29 DIAGNOSIS — I5020 Unspecified systolic (congestive) heart failure: Secondary | ICD-10-CM | POA: Diagnosis not present

## 2021-10-29 DIAGNOSIS — J449 Chronic obstructive pulmonary disease, unspecified: Secondary | ICD-10-CM | POA: Diagnosis not present

## 2021-10-29 DIAGNOSIS — E861 Hypovolemia: Secondary | ICD-10-CM

## 2021-10-29 DIAGNOSIS — E039 Hypothyroidism, unspecified: Secondary | ICD-10-CM | POA: Diagnosis not present

## 2021-10-29 DIAGNOSIS — D539 Nutritional anemia, unspecified: Secondary | ICD-10-CM

## 2021-10-29 DIAGNOSIS — N1832 Chronic kidney disease, stage 3b: Secondary | ICD-10-CM

## 2021-10-29 DIAGNOSIS — E119 Type 2 diabetes mellitus without complications: Secondary | ICD-10-CM | POA: Diagnosis not present

## 2021-10-29 DIAGNOSIS — I9589 Other hypotension: Secondary | ICD-10-CM | POA: Diagnosis not present

## 2021-10-29 DIAGNOSIS — D508 Other iron deficiency anemias: Secondary | ICD-10-CM

## 2021-10-29 LAB — CBC WITH DIFFERENTIAL/PLATELET
Basophils Absolute: 0 10*3/uL (ref 0.0–0.1)
Basophils Relative: 0.6 % (ref 0.0–3.0)
Eosinophils Absolute: 0.6 10*3/uL (ref 0.0–0.7)
Eosinophils Relative: 11.9 % — ABNORMAL HIGH (ref 0.0–5.0)
HCT: 32.7 % — ABNORMAL LOW (ref 36.0–46.0)
Hemoglobin: 10.4 g/dL — ABNORMAL LOW (ref 12.0–15.0)
Lymphocytes Relative: 15.9 % (ref 12.0–46.0)
Lymphs Abs: 0.8 10*3/uL (ref 0.7–4.0)
MCHC: 31.8 g/dL (ref 30.0–36.0)
MCV: 85.1 fl (ref 78.0–100.0)
Monocytes Absolute: 0.6 10*3/uL (ref 0.1–1.0)
Monocytes Relative: 11 % (ref 3.0–12.0)
Neutro Abs: 3.1 10*3/uL (ref 1.4–7.7)
Neutrophils Relative %: 60.6 % (ref 43.0–77.0)
Platelets: 255 10*3/uL (ref 150.0–400.0)
RBC: 3.84 Mil/uL — ABNORMAL LOW (ref 3.87–5.11)
RDW: 22.2 % — ABNORMAL HIGH (ref 11.5–15.5)
WBC: 5 10*3/uL (ref 4.0–10.5)

## 2021-10-29 LAB — BASIC METABOLIC PANEL
BUN: 26 mg/dL — ABNORMAL HIGH (ref 6–23)
CO2: 35 mEq/L — ABNORMAL HIGH (ref 19–32)
Calcium: 9.6 mg/dL (ref 8.4–10.5)
Chloride: 93 mEq/L — ABNORMAL LOW (ref 96–112)
Creatinine, Ser: 1.03 mg/dL (ref 0.40–1.20)
GFR: 54.43 mL/min — ABNORMAL LOW (ref >60.00)
Glucose, Bld: 120 mg/dL — ABNORMAL HIGH (ref 70–99)
Potassium: 4.1 mEq/L (ref 3.5–5.1)
Sodium: 135 mEq/L (ref 135–145)

## 2021-10-29 LAB — URINALYSIS, ROUTINE W REFLEX MICROSCOPIC
Bilirubin Urine: NEGATIVE
Hgb urine dipstick: NEGATIVE
Ketones, ur: NEGATIVE
Nitrite: NEGATIVE
RBC / HPF: NONE SEEN (ref 0–?)
Specific Gravity, Urine: 1.015 (ref 1.000–1.030)
Total Protein, Urine: NEGATIVE
Urine Glucose: NEGATIVE
Urobilinogen, UA: 1 (ref 0.0–1.0)
pH: 6 (ref 5.0–8.0)

## 2021-10-29 LAB — TSH: TSH: 2.58 u[IU]/mL (ref 0.35–5.50)

## 2021-10-29 LAB — FOLATE: Folate: >24.2 ng/mL (ref >5.9)

## 2021-10-29 LAB — IBC + FERRITIN
Ferritin: 28.7 ng/mL (ref 10.0–291.0)
Iron: 26 ug/dL — ABNORMAL LOW (ref 42–145)
Saturation Ratios: 5.7 % — ABNORMAL LOW (ref 20.0–50.0)
TIBC: 457.8 ug/dL — ABNORMAL HIGH (ref 250.0–450.0)
Transferrin: 327 mg/dL (ref 212.0–360.0)

## 2021-10-29 LAB — VITAMIN B12: Vitamin B-12: 215 pg/mL (ref 211–911)

## 2021-10-29 LAB — CORTISOL: Cortisol, Plasma: 10.4 ug/dL

## 2021-10-29 LAB — MICROALBUMIN / CREATININE URINE RATIO
Creatinine,U: 139.2 mg/dL
Microalb Creat Ratio: 1.2 mg/g (ref 0.0–30.0)
Microalb, Ur: 1.6 mg/dL (ref 0.0–1.9)

## 2021-10-29 LAB — HEMOGLOBIN A1C: Hgb A1c MFr Bld: 5 % (ref 4.6–6.5)

## 2021-10-29 MED ORDER — TORSEMIDE 10 MG PO TABS: 10.0000 mg | ORAL_TABLET | Freq: Every day | ORAL | 0 refills | Status: DC

## 2021-10-29 MED ORDER — ACCRUFER 30 MG PO CAPS: 1.0000 | ORAL_CAPSULE | Freq: Two times a day (BID) | ORAL | 1 refills | Status: DC

## 2021-10-29 NOTE — Progress Notes (Signed)
Subjective:  Patient ID: Hannah Scott, female    DOB: February 06, 1951  Age: 71 y.o. MRN: [id number]  CC: Anemia, Coronary Artery Disease, Hypothyroidism, and Diabetes   HPI Hannah Scott presents for f/up -  She has had multiple admissions recently and one visit to the ICU.  She and her son describe episodes of flash pulmonary edema.  ECHO showed EF>75% and hyperdynamic. She is taking both a loop and a thiazide diuretic and complains that her blood pressure is too low.  She complains of lightheadedness, weakness, and dizziness.  She has had no lower extremity edema and no weight gain.   Outpatient Medications Prior to Visit  Medication Sig Dispense Refill   albuterol (VENTOLIN HFA) 108 (90 Base) MCG/ACT inhaler Inhale 2 puffs into the lungs every 4 (four) hours as needed for wheezing or shortness of breath. 1 each 11   aspirin EC 81 MG tablet Take 1 tablet (81 mg total) by mouth daily. Swallow whole. 90 tablet 3   atorvastatin (LIPITOR) 20 MG tablet Take 1 tablet by mouth daily.     bisoprolol (ZEBETA) 5 MG tablet Take 5 mg by mouth daily.     DULoxetine (CYMBALTA) 30 MG capsule TAKE ONE CAPSULE EVERY DAY IN ADDITION TO '60MG'$  CAPSULE FOR A TOTAL OF '90MG'$  DAILY (Patient taking differently: Take 30 mg by mouth daily. (Take with '60mg'$  capsule for a total dose of '90mg'$ ) Taking 3 tablets by mouth at bedtime) 90 capsule 1   DULoxetine (CYMBALTA) 60 MG capsule TAKE ONE CAPSULE BY MOUTH DAILY. TAKE ALONG WITH '30MG'$  CAPSULE. TOTAL DAILY DOSE = 90 MG. (Patient taking differently: Take 60 mg by mouth daily. (Take with '30mg'$  capsule for a total dose of '90mg'$ )) 30 capsule 1   famotidine (PEPCID) 20 MG tablet One after supper (Patient taking differently: Take 20 mg by mouth every evening.) 30 tablet 11   fluticasone furoate-vilanterol (BREO ELLIPTA) 100-25 MCG/INH AEPB Inhale 1 puff into the lungs daily. 60 each 11   gabapentin (NEURONTIN) 600 MG tablet TAKE ONE TABLET EVERY DAY AT BEDTIME (Patient taking differently:  Take 600 mg by mouth at bedtime.) 30 tablet 5   HYDROcodone-acetaminophen (NORCO) 10-325 MG tablet Take 1 tablet by mouth 3 (three) times daily as needed.     ipratropium-albuterol (DUONEB) 0.5-2.5 (3) MG/3ML SOLN Take 3 mLs by nebulization every 4 (four) hours as needed (wheezing or SOB). During exacerbation 360 mL 1   irbesartan (AVAPRO) 150 MG tablet Take 150 mg by mouth daily.     levothyroxine (SYNTHROID) 100 MCG tablet Take 1 tablet (100 mcg total) by mouth daily. 90 tablet 3   pantoprazole (PROTONIX) 40 MG tablet TAKE 1 TABLET(40 MG) BY MOUTH DAILY 30 TO 60 MINUTES BEFORE FIRST MEAL OF THE DAY 30 tablet 0   Vitamin D, Ergocalciferol, (DRISDOL) 1.25 MG (50000 UNIT) CAPS capsule TAKE ONE CAPSULE BY MOUTH ONCE A WEEK (Patient taking differently: Take 50,000 Units by mouth every 7 (seven) days.) 12 capsule 0   furosemide (LASIX) 20 MG tablet Take 1 tablet (20 mg total) by mouth daily. 30 tablet 0   hydrochlorothiazide (HYDRODIURIL) 12.5 MG tablet Take 1 tablet (12.5 mg total) by mouth daily. 90 tablet 3   No facility-administered medications prior to visit.    ROS Review of Systems  Constitutional:  Positive for fatigue. Negative for appetite change, chills, diaphoresis, fever and unexpected weight change.  Eyes: Negative.   Respiratory:  Positive for shortness of breath. Negative for cough, chest tightness  and wheezing.   Cardiovascular:  Negative for chest pain, palpitations and leg swelling.  Gastrointestinal:  Negative for abdominal pain, blood in stool, constipation, diarrhea, nausea and vomiting.  Endocrine: Negative.   Genitourinary: Negative.  Negative for difficulty urinating and dysuria.  Musculoskeletal: Negative.  Negative for myalgias.  Skin: Negative.  Negative for color change and pallor.  Neurological:  Positive for dizziness, weakness and light-headedness. Negative for numbness and headaches.  Hematological:  Negative for adenopathy. Does not bruise/bleed easily.   Psychiatric/Behavioral: Negative.      Objective:  BP 108/64 (BP Location: Right Arm, Patient Position: Sitting, Cuff Size: Normal)   Pulse 100   Temp 97.8 F (36.6 C) (Oral)   Resp 16   Wt 140 lb (63.5 kg)   SpO2 96%   BMI 27.34 kg/m   BP Readings from Last 3 Encounters:  10/29/21 108/64  10/29/21 100/68  10/15/21 130/80    Wt Readings from Last 3 Encounters:  10/29/21 140 lb (63.5 kg)  10/29/21 140 lb (63.5 kg)  10/15/21 139 lb 12.8 oz (63.4 kg)    Physical Exam Vitals reviewed.  Constitutional:      General: She is not in acute distress.    Appearance: She is ill-appearing (O2 and in a wheelchair). She is not toxic-appearing or diaphoretic.  HENT:     Nose: Nose normal.     Mouth/Throat:     Mouth: Mucous membranes are moist.  Eyes:     General: No scleral icterus.    Conjunctiva/sclera: Conjunctivae normal.  Cardiovascular:     Rate and Rhythm: Normal rate and regular rhythm.     Heart sounds: Normal heart sounds, S1 normal and S2 normal. No murmur heard.    No friction rub. No gallop.  Pulmonary:     Effort: Pulmonary effort is normal.     Breath sounds: No stridor. No wheezing, rhonchi or rales.  Abdominal:     General: Abdomen is flat.     Palpations: There is no mass.     Tenderness: There is no abdominal tenderness. There is no guarding.     Hernia: No hernia is present.  Musculoskeletal:     Cervical back: Neck supple.     Right lower leg: No edema.     Left lower leg: No edema.  Lymphadenopathy:     Cervical: No cervical adenopathy.  Skin:    General: Skin is warm and dry.  Neurological:     General: No focal deficit present.     Mental Status: She is alert.  Psychiatric:        Mood and Affect: Mood normal.        Behavior: Behavior normal.     Lab Results  Component Value Date   WBC 5.0 10/29/2021   HGB 10.4 (L) 10/29/2021   HCT 32.7 (L) 10/29/2021   PLT 255.0 10/29/2021   GLUCOSE 120 (H) 10/29/2021   CHOL 120 07/19/2021   TRIG  81.0 07/19/2021   HDL 53.90 07/19/2021   LDLDIRECT 154.8 02/20/2009   LDLCALC 50 07/19/2021   ALT 92 (H) 09/20/2021   AST 55 (H) 09/20/2021   NA 135 10/29/2021   K 4.1 10/29/2021   CL 93 (L) 10/29/2021   CREATININE 1.03 10/29/2021   BUN 26 (H) 10/29/2021   CO2 35 (H) 10/29/2021   TSH 2.58 10/29/2021   INR 1.2 09/18/2021   HGBA1C 5.0 10/29/2021   MICROALBUR 1.6 10/29/2021    ECHOCARDIOGRAM COMPLETE  Result Date: 09/11/2021  ECHOCARDIOGRAM REPORT   Patient Name:   Hannah Scott Hannah Scott Date of Exam: 09/11/2021 Medical Rec #:  [id number]   Height:       60.0 in Accession #:    [id number]  Weight:       175.9 lb Date of Birth:  Dec 17, 1950   BSA:          1.768 m Patient Age:    37 years    BP:           121/74 mmHg Patient Gender: F           HR:           109 bpm. Exam Location:  Inpatient Procedure: 2D Echo, Cardiac Doppler and Color Doppler Indications:    Abnormal ekg  History:        Patient has prior history of Echocardiogram examinations, most                 recent 06/16/2020. CAD, Signs/Symptoms:Chest Pain; Risk                 Factors:Hypertension.  Sonographer:    Joette Catching RCS Referring Phys: [id number] Mick Sell  Sonographer Comments: Image acquisition challenging due to respiratory motion and supine patient. IMPRESSIONS  1. Left ventricular ejection fraction, by estimation, is >75%. The left ventricle has hyperdynamic function. The left ventricle has no regional wall motion abnormalities.  2. RV difficult to image In some views, appears dilated with depressed function Would consider limited echo with Definity to visualize RV better and define it's function. Right ventricular systolic function is low normal. The right ventricular size is normal.  3. Left atrial size was small.  4. Right atrial size was severely dilated.  5. The mitral valve is normal in structure. Trivial mitral valve regurgitation.  6. The aortic valve is tricuspid. Aortic valve regurgitation is not visualized.  7. The  inferior vena cava is normal in size with greater than 50% respiratory variability, suggesting right atrial pressure of 3 mmHg. FINDINGS  Left Ventricle: Left ventricular ejection fraction, by estimation, is >75%. The left ventricle has hyperdynamic function. The left ventricle has no regional wall motion abnormalities. There is no left ventricular hypertrophy. Right Ventricle: RV difficult to image In some views, appears dilated with depressed function Would consider limited echo with Definity to visualize RV better and define it's function. The right ventricular size is normal. Right vetricular wall thickness  was not assessed. Right ventricular systolic function is low normal. Left Atrium: Left atrial size was small. Right Atrium: Right atrial size was severely dilated. Pericardium: There is no evidence of pericardial effusion. Mitral Valve: The mitral valve is normal in structure. Trivial mitral valve regurgitation. Tricuspid Valve: The tricuspid valve is normal in structure. Tricuspid valve regurgitation is mild. Aortic Valve: The aortic valve is tricuspid. Aortic valve regurgitation is not visualized. Aortic valve mean gradient measures 5.0 mmHg. Aortic valve peak gradient measures 10.8 mmHg. Aortic valve area, by VTI measures 4.17 cm. Pulmonic Valve: The pulmonic valve was normal in structure. Pulmonic valve regurgitation is mild. Aorta: The aortic root and ascending aorta are structurally normal, with no evidence of dilitation. Venous: The inferior vena cava is normal in size with greater than 50% respiratory variability, suggesting right atrial pressure of 3 mmHg. IAS/Shunts: The interatrial septum was not well visualized.  LEFT VENTRICLE PLAX 2D LVIDd:         3.70 cm   Diastology LVIDs:  2.50 cm   LV e' medial:    7.29 cm/s LV PW:         1.00 cm   LV E/e' medial:  9.1 LV IVS:        1.10 cm   LV e' lateral:   9.90 cm/s LVOT diam:     2.40 cm   LV E/e' lateral: 6.7 LV SV:         95 LV SV Index:    53 LVOT Area:     4.52 cm  RIGHT VENTRICLE             IVC RV Basal diam:  3.90 cm     IVC diam: 1.70 cm RV Mid diam:    3.25 cm RV S prime:     12.70 cm/s TAPSE (M-mode): 2.4 cm LEFT ATRIUM             Index        RIGHT ATRIUM           Index LA diam:        3.70 cm 2.09 cm/m   RA Area:     19.30 cm LA Vol (A2C):   45.7 ml 25.85 ml/m  RA Volume:   50.40 ml  28.51 ml/m LA Vol (A4C):   18.7 ml 10.58 ml/m LA Biplane Vol: 29.4 ml 16.63 ml/m  AORTIC VALVE                     PULMONIC VALVE AV Area (Vmax):    4.11 cm      PV Vmax:          1.10 m/s AV Area (Vmean):   3.80 cm      PV Peak grad:     4.8 mmHg AV Area (VTI):     4.17 cm      PR End Diast Vel: 4.41 msec AV Vmax:           164.00 cm/s AV Vmean:          113.000 cm/s AV VTI:            0.227 m AV Peak Grad:      10.8 mmHg AV Mean Grad:      5.0 mmHg LVOT Vmax:         149.00 cm/s LVOT Vmean:        95.000 cm/s LVOT VTI:          0.209 m LVOT/AV VTI ratio: 0.92  AORTA Ao Root diam: 2.90 cm Ao Asc diam:  3.50 cm MITRAL VALVE                TRICUSPID VALVE MV Area (PHT): 7.99 cm     TR Peak grad:   62.4 mmHg MV Decel Time: 95 msec      TR Vmax:        395.00 cm/s MV E velocity: 66.70 cm/s MV A velocity: 120.00 cm/s  SHUNTS MV E/A ratio:  0.56         Systemic VTI:  0.21 m                             Systemic Diam: 2.40 cm Dorris Carnes MD Electronically signed by Dorris Carnes MD Signature Date/Time: 09/11/2021/2:46:43 PM    Final    US Abdomen Limited RUQ (LIVER/GB)  Result Date: 09/11/2021 CLINICAL DATA:  Elevated liver enzymes. EXAM: ULTRASOUND ABDOMEN LIMITED RIGHT  UPPER QUADRANT COMPARISON:  None Available. FINDINGS: Gallbladder: No gallstones or wall thickening visualized. A tiny non-mobile polyp is seen along the nondependent wall of the gallbladder (no followup imaging recommended) . No sonographic Murphy sign noted by sonographer. Common bile duct: Diameter: 3 mm, within normal limits. Liver: Suboptimal visualization due to patient habitus and  inability to cooperate during the exam. No focal lesion identified. Within normal limits in parenchymal echogenicity. Portal vein is patent on color Doppler imaging with normal direction of blood flow towards the liver. Other: None. IMPRESSION: No evidence of cholelithiasis or biliary ductal dilatation. Unremarkable sonographic appearance of the liver. Electronically Signed   By: Marlaine Hind M.D.   On: 09/11/2021 10:37   DG Chest Port 1 View  Result Date: 09/11/2021 CLINICAL DATA:  Opacity of lung on imaging study. EXAM: PORTABLE CHEST 1 VIEW COMPARISON:  PA and lateral 09/01/2020. FINDINGS: 5:41 a.m., 09/11/2021. The lungs are expiratory. There is similar cardiomegaly to the prior study with increased central vascular prominence which could be due to low lung volumes or perihilar vascular congestion. There is increased opacity in the left low lung field which could be atelectasis or consolidation and a small left pleural effusion appears to have formed. The upper lung fields generally clear with obscuration of the right lower lung field due to an asymmetrically elevated hemidiaphragm. The mediastinum is stable. There is a aortic tortuosity with atherosclerosis. The thoracic cage grossly intact with severe dextroscoliosis of the thoracic spine. IMPRESSION: 1. Limited exam with low inspiration, but there is asymmetric opacity in the left lower zone which is suspected more than typical for simple atelectasis. Consolidation and/or effusion suspected. 2. Cardiomegaly with increased central vascular prominence which could relate to low lung volumes or perihilar vascular congestion. 3. Clinical correlation and radiographic follow-up recommended. Electronically Signed   By: Telford Nab M.D.   On: 09/11/2021 07:48   [place] EKG SITE RITE  Result Date: 09/11/2021 If Site Rite image not attached, placement could not be confirmed due to current cardiac rhythm.   Assessment & Plan:   Annamaria was seen today for anemia,  coronary artery disease, hypothyroidism and diabetes.  Diagnoses and all orders for this visit:  Essential hypertension- Her blood pressure is overcontrolled and she is symptomatic.  I have asked her and her son to stop taking Lasix and hydrochlorothiazide.  Will prescribe low-dose, once daily Demadex prn. -     Basic metabolic panel; Future -     CBC with Differential/Platelet; Future -     TSH; Future -     Urinalysis, Routine w reflex microscopic; Future -     Cortisol; Future -     Cortisol -     Urinalysis, Routine w reflex microscopic -     TSH -     CBC with Differential/Platelet -     Basic metabolic panel -     torsemide (DEMADEX) 10 MG tablet; Take 1 tablet (10 mg total) by mouth daily.  Acquired hypothyroidism- She is euthyroid. -     TSH; Future -     TSH  Diabetes mellitus without complication ([place])- Her blood sugar is very well controlled. -     Basic metabolic panel; Future -     Microalbumin / creatinine urine ratio; Future -     Hemoglobin A1c; Future -     Hemoglobin A1c -     Microalbumin / creatinine urine ratio -     Basic metabolic panel  Hypotension due to  hypovolemia- Her cortisol is normal.  She has developed mild renal insufficiency.  Her anemia is worsening and she has a low iron level  I think this is contributing to the low blood pressure. -     Basic metabolic panel; Future -     Urinalysis, Routine w reflex microscopic; Future -     Cortisol; Future -     Cortisol -     Urinalysis, Routine w reflex microscopic -     Basic metabolic panel  LVH (left ventricular hypertrophy) -     Cortisol; Future -     Cortisol -     torsemide (DEMADEX) 10 MG tablet; Take 1 tablet (10 mg total) by mouth daily.  Deficiency anemia- Will check labs to screen for vitamin deficiencies. -     Vitamin B12; Future -     CBC with Differential/Platelet; Future -     Folate; Future -     IBC + Ferritin; Future -     Zinc; Future -     Vitamin B1; Future -      Vitamin B1 -     Zinc -     IBC + Ferritin -     Folate -     CBC with Differential/Platelet -     Vitamin B12  Iron deficiency anemia secondary to inadequate dietary iron intake- I have asked her to start taking an oral iron supplement.  If she cannot afford the oral iron supplement then I will refer her for iron infusions. -     Ferric Maltol (ACCRUFER) 30 MG CAPS; Take 1 capsule by mouth in the morning and at bedtime.  Stage 3b chronic kidney disease ([place])- Will discontinue the thiazide diuretic.  She is avoiding nephrotoxic agents.   I have discontinued Gabbie J. Sundby's hydrochlorothiazide and furosemide. I am also having her start on ACCRUFeR and torsemide. Additionally, I am having her maintain her ipratropium-albuterol, aspirin EC, albuterol, famotidine, bisoprolol, irbesartan, fluticasone furoate-vilanterol, gabapentin, pantoprazole, DULoxetine, Vitamin D (Ergocalciferol), levothyroxine, DULoxetine, HYDROcodone-acetaminophen, and atorvastatin.  Meds ordered this encounter  Medications   Ferric Maltol (ACCRUFER) 30 MG CAPS    Sig: Take 1 capsule by mouth in the morning and at bedtime.    Dispense:  180 capsule    Refill:  1   torsemide (DEMADEX) 10 MG tablet    Sig: Take 1 tablet (10 mg total) by mouth daily.    Dispense:  90 tablet    Refill:  0   In addition to time spent on CPE, I spent 45 minutes in preparing to see the patient by review of recent labs, imaging and procedures, obtaining and reviewing separately obtained history, communicating with the patient and family, ordering medications, and documenting clinical information in the EHR including the differential Dx, treatment, and any further evaluation and management of multiple complex medical issues.     Follow-up: Return in about 3 months (around 01/29/2022).  Scarlette Calico, MD

## 2021-10-29 NOTE — Patient Instructions (Signed)

## 2021-11-01 ENCOUNTER — Telehealth: Payer: Self-pay | Admitting: *Deleted

## 2021-11-01 NOTE — Telephone Encounter (Signed)
Return Ms. Parham call,  She is asking for refill on her Hydrocodone.  Will ask for discharge summary from [facility] in [place], she verbalizes understanding.  Discharge summary from Hospital was reviewed.  Her LFT's was elevated, will repeat CMP.  Will discussed the above with Dr Letta Pate.

## 2021-11-01 NOTE — Telephone Encounter (Signed)
[name] just got out of the hospital and she is wanting to talk with Hannah Scott about her hydrocodone. She is almost out.

## 2021-11-01 NOTE — Progress Notes (Unsigned)
Patient seen for appointment on Friday.

## 2021-11-02 ENCOUNTER — Other Ambulatory Visit: Payer: Self-pay | Admitting: Internal Medicine

## 2021-11-02 ENCOUNTER — Telehealth: Payer: Self-pay | Admitting: Registered Nurse

## 2021-11-02 DIAGNOSIS — R7989 Other specified abnormal findings of blood chemistry: Secondary | ICD-10-CM

## 2021-11-02 DIAGNOSIS — D538 Other specified nutritional anemias: Secondary | ICD-10-CM | POA: Insufficient documentation

## 2021-11-02 LAB — VITAMIN B1: Vitamin B1 (Thiamine): 9 nmol/L (ref 8–30)

## 2021-11-02 LAB — ZINC: Zinc: 42 ug/dL — ABNORMAL LOW (ref 60–130)

## 2021-11-02 MED ORDER — ZINC GLUCONATE 50 MG PO TABS: 50.0000 mg | ORAL_TABLET | Freq: Every day | ORAL | 1 refills | Status: DC

## 2021-11-02 NOTE — Telephone Encounter (Signed)
Placed a call to Hannah Scott,  Received Fax from [facility] [facility]. Note was reviewed.  CMP and Hepatic Panel ordered, Hannah Scott will go to [facility] between today and tomorrow she states.  Awaiting Results.

## 2021-11-03 ENCOUNTER — Telehealth: Payer: Self-pay | Admitting: Registered Nurse

## 2021-11-03 DIAGNOSIS — R7989 Other specified abnormal findings of blood chemistry: Secondary | ICD-10-CM | POA: Diagnosis not present

## 2021-11-03 NOTE — Telephone Encounter (Signed)
Return Ms. Vecchione call,  She reports she went for her lab work today.  Awaiting her results. She verbalizes understanding.

## 2021-11-03 NOTE — Telephone Encounter (Signed)
Patient states she needs to speak with Zella Ball ask for a return call

## 2021-11-04 ENCOUNTER — Telehealth: Payer: Self-pay | Admitting: Registered Nurse

## 2021-11-04 LAB — COMPREHENSIVE METABOLIC PANEL
ALT: 13 IU/L (ref 0–32)
AST: 23 IU/L (ref 0–40)
Albumin/Globulin Ratio: 1.8 (ref 1.2–2.2)
Albumin: 3.8 g/dL (ref 3.8–4.8)
Alkaline Phosphatase: 102 IU/L (ref 44–121)
BUN/Creatinine Ratio: 28 (ref 12–28)
BUN: 18 mg/dL (ref 8–27)
Bilirubin Total: 0.6 mg/dL (ref 0.0–1.2)
CO2: 28 mmol/L (ref 20–29)
Calcium: 9.5 mg/dL (ref 8.7–10.3)
Chloride: 99 mmol/L (ref 96–106)
Creatinine, Ser: 0.64 mg/dL (ref 0.57–1.00)
Globulin, Total: 2.1 g/dL (ref 1.5–4.5)
Glucose: 79 mg/dL (ref 70–99)
Potassium: 4.2 mmol/L (ref 3.5–5.2)
Sodium: 141 mmol/L (ref 134–144)
Total Protein: 5.9 g/dL — ABNORMAL LOW (ref 6.0–8.5)
eGFR: 94 mL/min/{1.73_m2} (ref >59)

## 2021-11-04 LAB — HEPATIC FUNCTION PANEL: Bilirubin, Direct: 0.32 mg/dL (ref 0.00–0.40)

## 2021-11-04 MED ORDER — HYDROCODONE-ACETAMINOPHEN 10-325 MG PO TABS: 1.0000 | ORAL_TABLET | Freq: Three times a day (TID) | ORAL | 0 refills | Status: DC | PRN

## 2021-11-04 NOTE — Telephone Encounter (Signed)
Lab Results Reviewed.  PMP was Reviewed.  Hydrocodone e-scribed today.  Call placed to [name] regarding the above, she verbalizes understanding.

## 2021-11-05 DIAGNOSIS — E46 Unspecified protein-calorie malnutrition: Secondary | ICD-10-CM

## 2021-11-05 DIAGNOSIS — Z7982 Long term (current) use of aspirin: Secondary | ICD-10-CM

## 2021-11-05 DIAGNOSIS — Z9981 Dependence on supplemental oxygen: Secondary | ICD-10-CM

## 2021-11-05 DIAGNOSIS — I2510 Atherosclerotic heart disease of native coronary artery without angina pectoris: Secondary | ICD-10-CM | POA: Diagnosis not present

## 2021-11-05 DIAGNOSIS — J45909 Unspecified asthma, uncomplicated: Secondary | ICD-10-CM

## 2021-11-05 DIAGNOSIS — I10 Essential (primary) hypertension: Secondary | ICD-10-CM | POA: Diagnosis not present

## 2021-11-05 DIAGNOSIS — F339 Major depressive disorder, recurrent, unspecified: Secondary | ICD-10-CM

## 2021-11-05 DIAGNOSIS — E785 Hyperlipidemia, unspecified: Secondary | ICD-10-CM | POA: Diagnosis not present

## 2021-11-05 DIAGNOSIS — E1169 Type 2 diabetes mellitus with other specified complication: Secondary | ICD-10-CM | POA: Diagnosis not present

## 2021-11-05 DIAGNOSIS — Z7951 Long term (current) use of inhaled steroids: Secondary | ICD-10-CM

## 2021-11-05 DIAGNOSIS — Z79891 Long term (current) use of opiate analgesic: Secondary | ICD-10-CM

## 2021-11-05 DIAGNOSIS — R5381 Other malaise: Secondary | ICD-10-CM

## 2021-11-14 ENCOUNTER — Encounter: Payer: Self-pay | Admitting: Internal Medicine

## 2021-11-14 NOTE — Progress Notes (Signed)
Subjective:    Patient ID: Hannah Scott, female    DOB: June 02, 1950, 70 y.o.   MRN: [id number]     HPI Hannah Scott is here for follow up of her chronic medical problems.   She saw Dr Ronnald Ramp 7/7 due to her hospital admissions.  She had episodes of pulm flash edema.  At f/u she had c/o lightheadedness, weakness, dizziness.  Hctz and lasix were stopped and she was started on torsemide 10 mg daily.  She had low iron and zinc - she was started on zinc and iron supplementation.  She is wondering how she needs to take that.  [facility] in May - STEMI, sepsis w/ acute liver failure, acute cystitis, AKI, dehydration, N/V/D, fall, LLE cellulitis).   Then rehab - Tuesday -> sat.  Sat am sent to [facility] - O2 drop and unresponsive [place] [facility] - intubated - ICU, extubated after < 24 hrs.  ? Fluid and flash pulm edema.    Back to rehab x 10 days then went home   Taking weight daily - stable BP 98/55, 91/51, 122/69  Taking zinc.  To start iron-she just received the prescription.  She has not been eating well for a while.  States decreased appetite.  Fatigue - worse than chronic fatigue.  She is sleeping better - getting 6-7 hrs some nights.  She still wakes up tired.    Medications and allergies reviewed with patient and updated if appropriate.  Current Outpatient Medications on File Prior to Visit  Medication Sig Dispense Refill   albuterol (VENTOLIN HFA) 108 (90 Base) MCG/ACT inhaler Inhale 2 puffs into the lungs every 4 (four) hours as needed for wheezing or shortness of breath. 1 each 11   aspirin EC 81 MG tablet Take 1 tablet (81 mg total) by mouth daily. Swallow whole. 90 tablet 3   atorvastatin (LIPITOR) 20 MG tablet Take 1 tablet by mouth daily.     bisoprolol (ZEBETA) 5 MG tablet Take 5 mg by mouth daily.     DULoxetine (CYMBALTA) 30 MG capsule TAKE ONE CAPSULE EVERY DAY IN ADDITION TO '60MG'$  CAPSULE FOR A TOTAL OF '90MG'$  DAILY (Patient taking differently: Take 30 mg by mouth daily.  (Take with '60mg'$  capsule for a total dose of '90mg'$ ) Taking 3 tablets by mouth at bedtime) 90 capsule 1   DULoxetine (CYMBALTA) 60 MG capsule TAKE ONE CAPSULE BY MOUTH DAILY. TAKE ALONG WITH '30MG'$  CAPSULE. TOTAL DAILY DOSE = 90 MG. (Patient taking differently: Take 60 mg by mouth daily. (Take with '30mg'$  capsule for a total dose of '90mg'$ )) 30 capsule 1   famotidine (PEPCID) 20 MG tablet One after supper (Patient taking differently: Take 20 mg by mouth every evening.) 30 tablet 11   Ferric Maltol (ACCRUFER) 30 MG CAPS Take 1 capsule by mouth in the morning and at bedtime. 180 capsule 1   fluticasone furoate-vilanterol (BREO ELLIPTA) 100-25 MCG/INH AEPB Inhale 1 puff into the lungs daily. 60 each 11   gabapentin (NEURONTIN) 600 MG tablet TAKE ONE TABLET EVERY DAY AT BEDTIME (Patient taking differently: Take 600 mg by mouth at bedtime.) 30 tablet 5   HYDROcodone-acetaminophen (NORCO) 10-325 MG tablet Take 1 tablet by mouth 3 (three) times daily as needed. 90 tablet 0   ipratropium-albuterol (DUONEB) 0.5-2.5 (3) MG/3ML SOLN Take 3 mLs by nebulization every 4 (four) hours as needed (wheezing or SOB). During exacerbation 360 mL 1   irbesartan (AVAPRO) 150 MG tablet Take 150 mg by mouth  daily.     levothyroxine (SYNTHROID) 100 MCG tablet Take 1 tablet (100 mcg total) by mouth daily. 90 tablet 3   pantoprazole (PROTONIX) 40 MG tablet TAKE 1 TABLET(40 MG) BY MOUTH DAILY 30 TO 60 MINUTES BEFORE FIRST MEAL OF THE DAY 30 tablet 0   torsemide (DEMADEX) 10 MG tablet Take 1 tablet (10 mg total) by mouth daily. 90 tablet 0   Vitamin D, Ergocalciferol, (DRISDOL) 1.25 MG (50000 UNIT) CAPS capsule TAKE ONE CAPSULE BY MOUTH ONCE A WEEK (Patient taking differently: Take 50,000 Units by mouth every 7 (seven) days.) 12 capsule 0   zinc gluconate 50 MG tablet Take 1 tablet (50 mg total) by mouth daily. 90 tablet 1   No current facility-administered medications on file prior to visit.     Review of Systems  Constitutional:   Positive for appetite change (decreased) and fatigue.  Respiratory:  Positive for shortness of breath (chronic - improving). Negative for cough and wheezing.   Cardiovascular:  Positive for leg swelling. Negative for chest pain and palpitations.  Gastrointestinal:  Negative for abdominal pain, constipation, diarrhea and nausea.  Neurological:  Positive for headaches (occ). Negative for light-headedness.       Objective:   Vitals:   11/15/21 0929  BP: 102/76  Pulse: 70  Temp: 97.9 F (36.6 C)  SpO2: 90%   BP Readings from Last 3 Encounters:  11/15/21 102/76  10/29/21 108/64  10/29/21 100/68   Wt Readings from Last 3 Encounters:  11/15/21 141 lb 6.4 oz (64.1 kg)  10/29/21 140 lb (63.5 kg)  10/29/21 140 lb (63.5 kg)   Body mass index is 27.62 kg/m.    Physical Exam Constitutional:      General: She is not in acute distress.    Appearance: Normal appearance.  HENT:     Head: Normocephalic and atraumatic.  Eyes:     Conjunctiva/sclera: Conjunctivae normal.  Cardiovascular:     Rate and Rhythm: Normal rate and regular rhythm.     Heart sounds: Normal heart sounds. No murmur heard. Pulmonary:     Effort: Pulmonary effort is normal. No respiratory distress.     Breath sounds: Normal breath sounds. No wheezing.  Musculoskeletal:     Cervical back: Neck supple.     Right lower leg: Edema (1+ pitting edema) present.     Left lower leg: Edema (1+ pitting edema) present.  Lymphadenopathy:     Cervical: No cervical adenopathy.  Skin:    General: Skin is warm and dry.     Findings: Erythema (Mild erythema bilateral lower extremities without breaks in skin) present. No rash.  Neurological:     Mental Status: She is alert. Mental status is at baseline.  Psychiatric:        Behavior: Behavior normal.     Comments: Mildly depressed affect        Lab Results  Component Value Date   WBC 5.0 10/29/2021   HGB 10.4 (L) 10/29/2021   HCT 32.7 (L) 10/29/2021   PLT 255.0  10/29/2021   GLUCOSE 79 11/03/2021   CHOL 120 07/19/2021   TRIG 81.0 07/19/2021   HDL 53.90 07/19/2021   LDLDIRECT 154.8 02/20/2009   LDLCALC 50 07/19/2021   ALT 13 11/03/2021   AST 23 11/03/2021   NA 141 11/03/2021   K 4.2 11/03/2021   CL 99 11/03/2021   CREATININE 0.64 11/03/2021   BUN 18 11/03/2021   CO2 28 11/03/2021   TSH 2.58 10/29/2021   INR  1.2 09/18/2021   HGBA1C 5.0 10/29/2021   MICROALBUR 1.6 10/29/2021     Assessment & Plan:    See Problem List for Assessment and Plan of chronic medical problems.

## 2021-11-15 ENCOUNTER — Ambulatory Visit (INDEPENDENT_AMBULATORY_CARE_PROVIDER_SITE_OTHER): Payer: Medicare Other | Admitting: Internal Medicine

## 2021-11-15 VITALS — BP 102/76 | HR 70 | Temp 97.9°F | Ht 60.0 in | Wt 141.4 lb

## 2021-11-15 DIAGNOSIS — R5383 Other fatigue: Secondary | ICD-10-CM

## 2021-11-15 DIAGNOSIS — E039 Hypothyroidism, unspecified: Secondary | ICD-10-CM

## 2021-11-15 DIAGNOSIS — K219 Gastro-esophageal reflux disease without esophagitis: Secondary | ICD-10-CM | POA: Diagnosis not present

## 2021-11-15 DIAGNOSIS — I10 Essential (primary) hypertension: Secondary | ICD-10-CM

## 2021-11-15 DIAGNOSIS — D539 Nutritional anemia, unspecified: Secondary | ICD-10-CM

## 2021-11-15 DIAGNOSIS — R1319 Other dysphagia: Secondary | ICD-10-CM

## 2021-11-15 DIAGNOSIS — E119 Type 2 diabetes mellitus without complications: Secondary | ICD-10-CM

## 2021-11-15 MED ORDER — IRBESARTAN 75 MG PO TABS: 75.0000 mg | ORAL_TABLET | Freq: Every day | ORAL | 1 refills | Status: DC

## 2021-11-15 MED ORDER — IRBESARTAN 75 MG PO TABS
75.0000 mg | ORAL_TABLET | Freq: Every day | ORAL | 1 refills | Status: DC
Start: 2021-11-15 — End: 2022-05-16

## 2021-11-15 NOTE — Assessment & Plan Note (Signed)
Denies any current dysphagia Has had small esophageal stricture that has been dilated Possible dysmotility versus torturous esophagus related to kyphoscoliosis Stressed keeping GERD well controlled-weight loss may have helped

## 2021-11-15 NOTE — Assessment & Plan Note (Signed)
Chronic Lab Results  Component Value Date   TSH 2.58 10/29/2021   Last TSH within normal limits so this is not contributing to her fatigue Continue levothyroxine at 100 mcg daily

## 2021-11-15 NOTE — Assessment & Plan Note (Addendum)
Chronic Has been off protonix for a while Feels GERD is controlled-weight loss may have helped Continue pepcid 20 mg dialy  Stressed importance of GERD being controlled advised her to monitor for and let us know if its not controlled

## 2021-11-15 NOTE — Assessment & Plan Note (Signed)
Chronic Hit the diabetic range at 1 point, but sugars have been in prediabetic range-normal range Diet controlled We will monitor

## 2021-11-15 NOTE — Patient Instructions (Addendum)
     Medications changes include :   decrease irbesartan to 75 mg daily   Your prescription(s) have been sent to your pharmacy.     Return in about 2 months (around 01/16/2022) for follow up.

## 2021-11-15 NOTE — Assessment & Plan Note (Addendum)
Chronic Blood pressure overcontrolled at home and on the low side here today-?  Contributing to some of her fatigue Continue bisoprolol 5 mg daily Decrease irbesartan from 150 mg daily to 75 mg daily Continue torsemide 10 mg daily Continue to monitor blood pressure Sees cardiology next month Follow-up with me in 2 months, sooner if needed

## 2021-11-15 NOTE — Assessment & Plan Note (Signed)
Chronic Has chronic fatigue, but is worse since her hospitalization which is understandable She has seen a little improvement Encouraged increasing activity as much as possible-currently doing physical therapy at home Advised discussing with pulmonary possible sleep study to rule out sleep apnea Stressed sleep hygiene-she does not go to bed at the same time and advised having a bedtime and being consistent with it Blood pressure medication adjusted and will see if that helps Currently taking iron and zinc and we will see if that helps Follow-up in 2 months

## 2021-11-15 NOTE — Assessment & Plan Note (Signed)
New Anemia new since hospitalization 2 months ago Iron slightly low and zinc slightly low Started on iron and zinc supplementation She will take that for the next 1-2 months and then we will reevaluate Stressed that she needs to be eating more healthy and concentrate on getting the nutrients she needs

## 2021-11-20 DIAGNOSIS — I2510 Atherosclerotic heart disease of native coronary artery without angina pectoris: Secondary | ICD-10-CM | POA: Diagnosis not present

## 2021-11-20 DIAGNOSIS — I10 Essential (primary) hypertension: Secondary | ICD-10-CM | POA: Diagnosis not present

## 2021-11-20 DIAGNOSIS — R5381 Other malaise: Secondary | ICD-10-CM | POA: Diagnosis not present

## 2021-11-20 DIAGNOSIS — Z79891 Long term (current) use of opiate analgesic: Secondary | ICD-10-CM | POA: Diagnosis not present

## 2021-11-20 DIAGNOSIS — Z9981 Dependence on supplemental oxygen: Secondary | ICD-10-CM | POA: Diagnosis not present

## 2021-11-20 DIAGNOSIS — Z7982 Long term (current) use of aspirin: Secondary | ICD-10-CM | POA: Diagnosis not present

## 2021-11-20 DIAGNOSIS — E46 Unspecified protein-calorie malnutrition: Secondary | ICD-10-CM | POA: Diagnosis not present

## 2021-11-20 DIAGNOSIS — J45909 Unspecified asthma, uncomplicated: Secondary | ICD-10-CM | POA: Diagnosis not present

## 2021-11-20 DIAGNOSIS — Z7951 Long term (current) use of inhaled steroids: Secondary | ICD-10-CM | POA: Diagnosis not present

## 2021-11-20 DIAGNOSIS — E1169 Type 2 diabetes mellitus with other specified complication: Secondary | ICD-10-CM | POA: Diagnosis not present

## 2021-11-20 DIAGNOSIS — F339 Major depressive disorder, recurrent, unspecified: Secondary | ICD-10-CM | POA: Diagnosis not present

## 2021-11-20 DIAGNOSIS — E785 Hyperlipidemia, unspecified: Secondary | ICD-10-CM | POA: Diagnosis not present

## 2021-11-21 DIAGNOSIS — J449 Chronic obstructive pulmonary disease, unspecified: Secondary | ICD-10-CM | POA: Diagnosis not present

## 2021-11-21 DIAGNOSIS — I5020 Unspecified systolic (congestive) heart failure: Secondary | ICD-10-CM | POA: Diagnosis not present

## 2021-11-25 ENCOUNTER — Telehealth: Payer: Self-pay | Admitting: Registered Nurse

## 2021-11-25 NOTE — Telephone Encounter (Signed)
Patient has an appointment with Hannah Scott tomorrow and wants to make it a phone visit.  She doesn't have anyone to bring her to appt.  I did tell her we would have to ask Hannah Scott since she hasn't been in office since February  Please let patient know.[name]

## 2021-11-26 ENCOUNTER — Encounter: Payer: Medicare Other | Attending: Registered Nurse | Admitting: Registered Nurse

## 2021-11-26 ENCOUNTER — Encounter: Payer: Self-pay | Admitting: Registered Nurse

## 2021-11-26 VITALS — BP 128/74 | HR 84 | Ht 60.0 in

## 2021-11-26 DIAGNOSIS — Z5181 Encounter for therapeutic drug level monitoring: Secondary | ICD-10-CM

## 2021-11-26 DIAGNOSIS — M961 Postlaminectomy syndrome, not elsewhere classified: Secondary | ICD-10-CM | POA: Diagnosis not present

## 2021-11-26 DIAGNOSIS — M48062 Spinal stenosis, lumbar region with neurogenic claudication: Secondary | ICD-10-CM

## 2021-11-26 DIAGNOSIS — G894 Chronic pain syndrome: Secondary | ICD-10-CM

## 2021-11-26 DIAGNOSIS — M24551 Contracture, right hip: Secondary | ICD-10-CM | POA: Diagnosis not present

## 2021-11-26 DIAGNOSIS — Z79891 Long term (current) use of opiate analgesic: Secondary | ICD-10-CM

## 2021-11-26 MED ORDER — HYDROCODONE-ACETAMINOPHEN 10-325 MG PO TABS: 1.0000 | ORAL_TABLET | Freq: Three times a day (TID) | ORAL | 0 refills | Status: DC | PRN

## 2021-11-26 NOTE — Progress Notes (Signed)
Subjective:    Patient ID: Hannah Scott, female    DOB: [birth year], 71 y.o.   MRN: [id number]  HPI: Hannah Scott is a 71 y.o. female who called office to ask for a telephone visit, her son wasn't able to bring her today. She is not driving since her hospitalization. Hannah Scott was scheduled for a telephone visit this was discussed with Dr Read Drivers her last office visit was in April 2023. Dr Letta Pate agrees with telephone visit she is aware she will have to come for office visit in order to be prescribed Hydrocodone, she verbalizes understanding.  Hannah Scott and I have  discussed the limitations of evaluation and management by telemedicine and the availability of in person appointments. The patient expressed understanding and agreed to proceed She  states her pain is located in  her lower back . She rates her pain 8. Her current exercise regime is receiving physical therapy and walking with her walker in the home.   Hannah Scott is 30.00 MME.   Last Oral Swab was Performed on 06/22/2021, it was consistent.   Hannah Scott was admitted to [facility] on 09/09/2021 with NSTEMI, discharge summary reviewed.    Pain Inventory Average Pain 7 Pain Right Now 8 My pain is constant, burning, tingling, and aching  In the last 24 hours, has pain interfered with the following? General activity 10 Relation with others 10 Enjoyment of life 10 What TIME of day is your pain at its worst? morning , daytime, evening, and night Sleep (in general) Poor  Pain is worse with: walking, standing, and some activites Pain improves with: rest, heat/ice, and medication Relief from Meds: 6  Family History  Problem Relation Age of Onset   Heart failure Mother    Pneumonia Mother    Hypertension Mother    Heart disease Mother    Stroke Maternal Grandfather    Hypertension Maternal Grandfather    Heart attack Father    Heart disease Father    Asthma Paternal Uncle        PAT UNCLES   Heart attack Paternal  Uncle    Social History   Socioeconomic History   Marital status: Widowed    Spouse name: Not on file   Number of children: Not on file   Years of education: Not on file   Highest education level: Not on file  Occupational History   Not on file  Tobacco Use   Smoking status: Never    Passive exposure: Never   Smokeless tobacco: Never  Vaping Use   Vaping Use: Never used  Substance and Sexual Activity   Alcohol use: No    Alcohol/week: 0.0 standard drinks of alcohol   Drug use: No   Sexual activity: Not Currently    Partners: Male    Birth control/protection: Surgical  Other Topics Concern   Not on file  Social History Narrative   Widowed in 2015   2 sons 1 lives in the area the other is in close contact    1 grandchild   Never smoker no drug use no alcohol   Social Determinants of Health   Financial Resource Strain: Low Risk  (10/08/2020)   Overall Financial Resource Strain (CARDIA)    Difficulty of Paying Living Expenses: Not hard at all  Food Insecurity: No Food Insecurity (10/08/2020)   Hunger Vital Sign    Worried About Running Out of Food in the Last Year: Never true  Ran Out of Food in the Last Year: Never true  Transportation Needs: No Transportation Needs (10/08/2020)   PRAPARE - Hydrologist (Medical): No    Lack of Transportation (Non-Medical): No  Physical Activity: Inactive (10/08/2020)   Exercise Vital Sign    Days of Exercise per Week: 0 days    Minutes of Exercise per Session: 0 min  Stress: No Stress Concern Present (10/08/2020)   [place]    Feeling of Stress : Not at all  Social Connections: Moderately Integrated (10/08/2020)   Social Connection and Isolation Panel [NHANES]    Frequency of Communication with Friends and Family: More than three times a week    Frequency of Social Gatherings with Friends and Family: More than three times a week     Attends Religious Services: More than 4 times per year    Active Member of [employer] or Organizations: No    Attends Music therapist: More than 4 times per year    Marital Status: Widowed   Past Surgical History:  Procedure Laterality Date   ANKLE SURGERY Left    ligament   APPENDECTOMY  1987   AT [facility]   BACK SURGERY     Fusion   BREAST LUMPECTOMY  2003   radiation on right   BREAST LUMPECTOMY WITH RADIOACTIVE SEED LOCALIZATION Left 02/12/2016   Procedure: LEFT BREAST LUMPECTOMY WITH RADIOACTIVE SEED LOCALIZATION;  Surgeon: Excell Seltzer, MD;  Location: [facility];  Service: General;  Laterality: Left;   CARDIOVASCULAR STRESS TEST  09/07/05   Nuclear, was negative   CATARACT EXTRACTION Bilateral 01,03   COLONOSCOPY WITH PROPOFOL N/A 05/04/2021   Procedure: COLONOSCOPY WITH PROPOFOL;  Surgeon: Gatha Mayer, MD;  Location: [facility];  Service: Endoscopy;  Laterality: N/A;   ESOPHAGOGASTRODUODENOSCOPY (EGD) WITH PROPOFOL N/A 05/04/2021   Procedure: ESOPHAGOGASTRODUODENOSCOPY (EGD) WITH PROPOFOL;  Surgeon: Gatha Mayer, MD;  Location: [facility];  Service: Endoscopy;  Laterality: N/A;   OOPHORECTOMY     LSO -RSO   PELVIC LAPAROSCOPY  1989   RSO, LYSIS OF ADHESIONS   POLYPECTOMY  05/04/2021   Procedure: POLYPECTOMY;  Surgeon: Gatha Mayer, MD;  Location: [facility];  Service: Endoscopy;;   TOTAL ABDOMINAL HYSTERECTOMY  1987   LSO, APPENDECTOMY   TOTAL HIP ARTHROPLASTY Right 10/28/2013   dr Lorin Mercy   TOTAL HIP ARTHROPLASTY Right 10/28/2013   Procedure: TOTAL HIP ARTHROPLASTY ANTERIOR APPROACH;  Surgeon: Marybelle Killings, MD;  Location: [facility];  Service: Orthopedics;  Laterality: Right;  Right Total Hip Arthroplasty, Direct Anterior Approach   Past Surgical History:  Procedure Laterality Date   ANKLE SURGERY Left    ligament   APPENDECTOMY  1987   AT [facility]   BACK SURGERY     Fusion   BREAST LUMPECTOMY  2003   radiation on right   BREAST LUMPECTOMY  WITH RADIOACTIVE SEED LOCALIZATION Left 02/12/2016   Procedure: LEFT BREAST LUMPECTOMY WITH RADIOACTIVE SEED LOCALIZATION;  Surgeon: Excell Seltzer, MD;  Location: [facility];  Service: General;  Laterality: Left;   CARDIOVASCULAR STRESS TEST  09/07/05   Nuclear, was negative   CATARACT EXTRACTION Bilateral 01,03   COLONOSCOPY WITH PROPOFOL N/A 05/04/2021   Procedure: COLONOSCOPY WITH PROPOFOL;  Surgeon: Gatha Mayer, MD;  Location: [facility];  Service: Endoscopy;  Laterality: N/A;   ESOPHAGOGASTRODUODENOSCOPY (EGD) WITH PROPOFOL N/A 05/04/2021   Procedure: ESOPHAGOGASTRODUODENOSCOPY (EGD) WITH PROPOFOL;  Surgeon: Gatha Mayer, MD;  Location: [facility];  Service: Endoscopy;  Laterality: N/A;   OOPHORECTOMY     LSO -RSO   PELVIC LAPAROSCOPY  1989   RSO, LYSIS OF ADHESIONS   POLYPECTOMY  05/04/2021   Procedure: POLYPECTOMY;  Surgeon: Gatha Mayer, MD;  Location: [facility];  Service: Endoscopy;;   TOTAL ABDOMINAL HYSTERECTOMY  1987   LSO, APPENDECTOMY   TOTAL HIP ARTHROPLASTY Right 10/28/2013   dr Lorin Mercy   TOTAL HIP ARTHROPLASTY Right 10/28/2013   Procedure: TOTAL HIP ARTHROPLASTY ANTERIOR APPROACH;  Surgeon: Marybelle Killings, MD;  Location: [facility];  Service: Orthopedics;  Laterality: Right;  Right Total Hip Arthroplasty, Direct Anterior Approach   Past Medical History:  Diagnosis Date   Anemia    hx   Arthritis    Asthma    PFTs, February, 2011, moderate obstructive disease with response to bronchodilators, normal lung volumes, moderate reduction in diffusing capacity   Atrial septal aneurysm    Echo, 2008-not noted on 13 echo   CAD (coronary artery disease)    90% distal LAD in the past  /   nuclear, 2008, no ischemia, ejection fraction 70%   Cancer ([place]) 2002   DUCTAL CIS--S/P LUMPECTOMY, RADIATION AND 6 WEEKS OF TAMOXIFEN   D-dimer, elevated    January, 2014   Depression    Ejection fraction    EF 60%, echo, October, 2008   Elevated CPK    January,  2014   Endometriosis 1989   RIGHT TUBE   Endometriosis 1987   LEFT TUBE/OVARY W FOCAL IN-SITU ENDOMETRIAL ADENOCARCINOMA   GERD (gastroesophageal reflux disease)    occ   H/O hiatal hernia    ?   Hyperlipidemia    Hypertension    Hypothyroidism    Patient has had in the past that she does not need treatment   Kyphoscoliosis    Obstructive airway disease ([place])    Pinched nerve    lower back   Shortness of breath    Echo 2/22: EF 60-65, no RWMA, mild LVH, GR 1  DD, GLS -24%, normal RVSF, mild MR, trivial AI, borderline dilation of aortic root (39 mm)   UTI (lower urinary tract infection)    BP 128/74 Comment: PT reported phone visit  Pulse 84   Ht 5' (1.524 m)   BMI 27.62 kg/m   Opioid Risk Score:   Fall Risk Score:  `1  Depression screen PHQ 2/9     11/26/2021    1:23 PM 08/13/2021   11:36 AM 07/20/2021    1:27 PM 06/22/2021    2:48 PM 05/25/2021    2:27 PM 03/11/2021   12:48 PM 02/10/2021    9:20 AM  Depression screen PHQ 2/9  Decreased Interest '3 3 1 1 '$ 0 3 3  Down, Depressed, Hopeless '3 3 1 1 1 3 3  '$ PHQ - 2 Score '6 6 2 2 1 6 6  '$ Altered sleeping 2        Tired, decreased energy 3        Change in appetite 3        Feeling bad or failure about yourself  0        Trouble concentrating 0        Moving slowly or fidgety/restless 0        Suicidal thoughts 0        PHQ-9 Score 14        Difficult doing work/chores Somewhat difficult  Review of Systems  Musculoskeletal:  Positive for back pain.       Bilateral Knee Pain   All other systems reviewed and are negative.     Objective:   Physical Exam Vitals and nursing note reviewed.  Musculoskeletal:     Comments: No Physical Exam Telephone Visit         Assessment & Plan:  1. Lumbar post laminectomy syndrome with severe kyphoscoliosis thoracolumbar spine, s/p lumbar fusion/  08/13/2021 Continue current medication regimen. Continue  Hydrocodone 10/'325mg'$  one tablet every 8 hours may take an extra  tablet when pain is severe #100. We will continue the opioid monitoring program, this consists of regular clinic visits, examinations, urine drug screen, pill counts as well as use of [place] Controlled Substance Reporting system. A 12 month History has been reviewed on the [place] Controlled Substance Reporting System on 11/26/2021 2. Right Hip OA: S/P Right Hip Replacement 10/28/2013. 11/26/2021 3.Depression: Continue Cymbalta. Has Family Support and Friends.  Counseling with Doristine Bosworth. 11/26/2021. 4. Right Greater Trochanteric Tenderness: No complaints today. Continue to Monitor. Continue with Ice and Heat Therapy. 11/26/2021 5. Poor Appetite/ Frail> Ms. Griesinger states PCP following. Continue to monitor. 11/26/2021 6. Chronic Bilateral Knee Pain: No complaints today .Continue HEP as Tolerated. Continue to Monitor. 11/26/2021   F/U in 1 month Telephone Visit Established Patient Location of Patient: In her Home Location of Provider: In the Office  Total Time Spent: 10 Minutes

## 2021-11-29 DIAGNOSIS — I5020 Unspecified systolic (congestive) heart failure: Secondary | ICD-10-CM | POA: Diagnosis not present

## 2021-11-29 DIAGNOSIS — J449 Chronic obstructive pulmonary disease, unspecified: Secondary | ICD-10-CM | POA: Diagnosis not present

## 2021-12-01 ENCOUNTER — Ambulatory Visit: Payer: Medicare Other | Admitting: Emergency Medicine

## 2021-12-01 ENCOUNTER — Encounter: Payer: Self-pay | Admitting: Emergency Medicine

## 2021-12-01 VITALS — BP 122/72 | HR 50 | Temp 98.3°F | Ht 60.0 in | Wt 145.4 lb

## 2021-12-01 DIAGNOSIS — R9389 Abnormal findings on diagnostic imaging of other specified body structures: Secondary | ICD-10-CM

## 2021-12-01 DIAGNOSIS — J4541 Moderate persistent asthma with (acute) exacerbation: Secondary | ICD-10-CM | POA: Diagnosis not present

## 2021-12-01 DIAGNOSIS — J9811 Atelectasis: Secondary | ICD-10-CM

## 2021-12-01 DIAGNOSIS — R0609 Other forms of dyspnea: Secondary | ICD-10-CM

## 2021-12-01 NOTE — Assessment & Plan Note (Signed)
With bibasilar atelectasis, right lower lobe atelectasis versus small 5 mm nodule.  We will plan to repeat a CT scan of the chest in December to assess for interval stability, resolution.

## 2021-12-01 NOTE — Patient Instructions (Signed)
Please continue your Breo 1 inhalation once daily.  Rinse and gargle after using. Keep your albuterol available to use either 2 puffs or 1 nebulizer treatment when you needed for shortness of breath, chest tightness, wheezing. Continue your oxygen at 0.5-1 L/min depending on your level of exertion.  We will repeat your walking oximetry next time to determine whether you need to continue this We will repeat your CT scan of the chest without contrast in December 2023 Follow Dr. Lamonte Sakai in December after your CT scan so we can review the results together.

## 2021-12-01 NOTE — Assessment & Plan Note (Signed)
Plan to continue Breo, albuterol as needed.  Control contributing factors including GERD, rhinitis

## 2021-12-01 NOTE — Assessment & Plan Note (Addendum)
Multifactorial dyspnea with contributions of restrictive lung disease from her kyphoscoliosis, obesity, history of asthma, diastolic dysfunction with volume overload.  All superimposed on weakness and deconditioning from recent critical illness hospitalization.  Her asthma appears to be fairly well compensated.  She is working with cardiology regarding her volume status, diuretics.  Noted to be hypoxemic during that hospitalization.  We will continue oxygen for now, plan to repeat her resting and exertional oximetry at her next visit to see if she continues to require.

## 2021-12-01 NOTE — Progress Notes (Signed)
Subjective:    Patient ID: Hannah Scott, female    DOB: [birth year], 71 y.o.   MRN: [id number]  HPI 71 year old never smoker with a history of childhood asthma, continued into adulthood.  She has been seen by Dr. Melvyn Novas in our office for obstructive lung disease, also has GERD, rhinitis. PMH: CAD, ductal breast CIS (lumpectomy, tamoxifen), hypertension, endometriosis, kyphoscoliosis She is referred today for abnormal CT scan of the chest.   She has been ill lately - multiple hospitalizations with sepsis from UTI + cellulitis, volume overload, possible B LL PNA. She required intubation, ventilated 1 day in setting of this as well as symptomatic hypoglycemia and encephalopathy. She is on breo, uses albuterol a few times a month. She is using O2 at 0.5L/min.   She had a CT-PA done at [facility] that identified elevated bilateral hemidiaphragms, some right basilar atelectasis versus small 5 mm nodule.  This was done when she was hospitalized.  She also had a swallowing evaluation.  She was sent home on supplemental oxygen.  CT-PA 09/26/21 at [place] reviewed by me, shows a tortuous aorta but no mediastinal or hilar adenopathy.  Small bilateral pleural effusions with moderate bibasilar atelectasis more so on the right.  Both hemidiaphragms are somewhat elevated.  There is superior segmental right lower lobe pulmonary nodule versus area of atelectasis that measures 5 mm.   Review of Systems As per HPI  Past Medical History:  Diagnosis Date   Anemia    hx   Arthritis    Asthma    PFTs, February, 2011, moderate obstructive disease with response to bronchodilators, normal lung volumes, moderate reduction in diffusing capacity   Atrial septal aneurysm    Echo, 2008-not noted on 13 echo   CAD (coronary artery disease)    90% distal LAD in the past  /   nuclear, 2008, no ischemia, ejection fraction 70%   Cancer ([place]) 2002   DUCTAL CIS--S/P LUMPECTOMY, RADIATION AND 6 WEEKS OF TAMOXIFEN   D-dimer,  elevated    January, 2014   Depression    Ejection fraction    EF 60%, echo, October, 2008   Elevated CPK    January, 2014   Endometriosis 1989   RIGHT TUBE   Endometriosis 1987   LEFT TUBE/OVARY W FOCAL IN-SITU ENDOMETRIAL ADENOCARCINOMA   GERD (gastroesophageal reflux disease)    occ   H/O hiatal hernia    ?   Hyperlipidemia    Hypertension    Hypothyroidism    Patient has had in the past that she does not need treatment   Kyphoscoliosis    Obstructive airway disease ([place])    Pinched nerve    lower back   Shortness of breath    Echo 2/22: EF 60-65, no RWMA, mild LVH, GR 1  DD, GLS -24%, normal RVSF, mild MR, trivial AI, borderline dilation of aortic root (39 mm)   UTI (lower urinary tract infection)      Family History  Problem Relation Age of Onset   Heart failure Mother    Pneumonia Mother    Hypertension Mother    Heart disease Mother    Stroke Maternal Grandfather    Hypertension Maternal Grandfather    Heart attack Father    Heart disease Father    Asthma Paternal Uncle        PAT UNCLES   Heart attack Paternal Uncle      Social History   Socioeconomic History   Marital status: Widowed  Spouse name: Not on file   Number of children: Not on file   Years of education: Not on file   Highest education level: Not on file  Occupational History   Not on file  Tobacco Use   Smoking status: Never    Passive exposure: Never   Smokeless tobacco: Never  Vaping Use   Vaping Use: Never used  Substance and Sexual Activity   Alcohol use: No    Alcohol/week: 0.0 standard drinks of alcohol   Drug use: No   Sexual activity: Not Currently    Partners: Male    Birth control/protection: Surgical  Other Topics Concern   Not on file  Social History Narrative   Widowed in 2015   2 sons 1 lives in the area the other is in close contact    1 grandchild   Never smoker no drug use no alcohol   Social Determinants of Health   Financial Resource Strain: Low Risk   (10/08/2020)   Overall Financial Resource Strain (CARDIA)    Difficulty of Paying Living Expenses: Not hard at all  Food Insecurity: No Food Insecurity (10/08/2020)   Hunger Vital Sign    Worried About Running Out of Food in the Last Year: Never true    [place] in the Last Year: Never true  Transportation Needs: No Transportation Needs (10/08/2020)   PRAPARE - Hydrologist (Medical): No    Lack of Transportation (Non-Medical): No  Physical Activity: Inactive (10/08/2020)   Exercise Vital Sign    Days of Exercise per Week: 0 days    Minutes of Exercise per Session: 0 min  Stress: No Stress Concern Present (10/08/2020)   [place]    Feeling of Stress : Not at all  Social Connections: Moderately Integrated (10/08/2020)   Social Connection and Isolation Panel [NHANES]    Frequency of Communication with Friends and Family: More than three times a week    Frequency of Social Gatherings with Friends and Family: More than three times a week    Attends Religious Services: More than 4 times per year    Active Member of [employer] or Organizations: No    Attends Music therapist: More than 4 times per year    Marital Status: Widowed  Intimate Partner Violence: Not At Risk (10/08/2020)   Humiliation, Afraid, Rape, and Kick questionnaire    Fear of Current or Ex-Partner: No    Emotionally Abused: No    Physically Abused: No    Sexually Abused: No     Allergies  Allergen Reactions   Fluticasone-Salmeterol Other (See Comments)    REACTION: headache.  She is tolerating Dulera well. Other reaction(s): Other (See Comments) REACTION: headache.  She is tolerating Dulera well.   Norvasc [Amlodipine Besylate] Swelling and Other (See Comments)    edema   Tape Other (See Comments)    Prefers paper tape Other reaction(s): Other (See Comments) Prefers paper tape- "everything else rips my  skin"   Heparin    Morphine Nausea Only     Outpatient Medications Prior to Visit  Medication Sig Dispense Refill   albuterol (VENTOLIN HFA) 108 (90 Base) MCG/ACT inhaler Inhale 2 puffs into the lungs every 4 (four) hours as needed for wheezing or shortness of breath. 1 each 11   aspirin EC 81 MG tablet Take 1 tablet (81 mg total) by mouth daily. Swallow whole. 90 tablet 3  atorvastatin (LIPITOR) 20 MG tablet Take 1 tablet by mouth daily.     bisoprolol (ZEBETA) 5 MG tablet Take 5 mg by mouth daily.     DULoxetine (CYMBALTA) 30 MG capsule TAKE ONE CAPSULE EVERY DAY IN ADDITION TO '60MG'$  CAPSULE FOR A TOTAL OF '90MG'$  DAILY (Patient taking differently: Take 30 mg by mouth daily. (Take with '60mg'$  capsule for a total dose of '90mg'$ ) Taking 3 tablets by mouth at bedtime) 90 capsule 1   DULoxetine (CYMBALTA) 60 MG capsule TAKE ONE CAPSULE BY MOUTH DAILY. TAKE ALONG WITH '30MG'$  CAPSULE. TOTAL DAILY DOSE = 90 MG. (Patient taking differently: Take 60 mg by mouth daily. (Take with '30mg'$  capsule for a total dose of '90mg'$ )) 30 capsule 1   famotidine (PEPCID) 20 MG tablet One after supper (Patient taking differently: Take 20 mg by mouth every evening.) 30 tablet 11   Ferric Maltol (ACCRUFER) 30 MG CAPS Take 1 capsule by mouth in the morning and at bedtime. 180 capsule 1   fluticasone furoate-vilanterol (BREO ELLIPTA) 100-25 MCG/INH AEPB Inhale 1 puff into the lungs daily. 60 each 11   gabapentin (NEURONTIN) 600 MG tablet TAKE ONE TABLET EVERY DAY AT BEDTIME (Patient taking differently: Take 600 mg by mouth at bedtime.) 30 tablet 5   HYDROcodone-acetaminophen (NORCO) 10-325 MG tablet Take 1 tablet by mouth 3 (three) times daily as needed. 90 tablet 0   ipratropium-albuterol (DUONEB) 0.5-2.5 (3) MG/3ML SOLN Take 3 mLs by nebulization every 4 (four) hours as needed (wheezing or SOB). During exacerbation 360 mL 1   irbesartan (AVAPRO) 75 MG tablet Take 1 tablet (75 mg total) by mouth daily. 90 tablet 1   levothyroxine  (SYNTHROID) 100 MCG tablet Take 1 tablet (100 mcg total) by mouth daily. 90 tablet 3   torsemide (DEMADEX) 10 MG tablet Take 1 tablet (10 mg total) by mouth daily. 90 tablet 0   Vitamin D, Ergocalciferol, (DRISDOL) 1.25 MG (50000 UNIT) CAPS capsule TAKE ONE CAPSULE BY MOUTH ONCE A WEEK (Patient taking differently: Take 50,000 Units by mouth every 7 (seven) days.) 12 capsule 0   zinc gluconate 50 MG tablet Take 1 tablet (50 mg total) by mouth daily. 90 tablet 1   No facility-administered medications prior to visit.        Objective:   Physical Exam Vitals:   12/01/21 1142  BP: 122/72  Pulse: (!) 50  Temp: 98.3 F (36.8 C)  TempSrc: Oral  SpO2: 93%  Weight: 145 lb 6.4 oz (66 kg)  Height: 5' (1.524 m)    Gen: Pleasant, obese elderly woman, in no distress,  normal affect  ENT: No lesions,  mouth clear,  oropharynx clear, no postnasal drip  Neck: No JVD, some hoarse voice without stridor  Lungs: No use of accessory muscles, decreased to both bases but no crackles or wheezes  Cardiovascular: RRR, heart sounds normal, no murmur or gallops, no peripheral edema  Musculoskeletal: Severe kyphoscoliosis  Neuro: alert, awake, non focal  Skin: Warm, no lesions or rash      Assessment & Plan:   DOE (dyspnea on exertion) Multifactorial dyspnea with contributions of restrictive lung disease from her kyphoscoliosis, obesity, history of asthma, diastolic dysfunction with volume overload.  All superimposed on weakness and deconditioning from recent critical illness hospitalization.  Her asthma appears to be fairly well compensated.  She is working with cardiology regarding her volume status, diuretics.  Noted to be hypoxemic during that hospitalization.  We will continue oxygen for now, plan to repeat  her resting and exertional oximetry at her next visit to see if she continues to require.  Moderate persistent asthma with exacerbation Plan to continue Breo, albuterol as needed.  Control  contributing factors including GERD, rhinitis  Abnormal CT of the chest With bibasilar atelectasis, right lower lobe atelectasis versus small 5 mm nodule.  We will plan to repeat a CT scan of the chest in December to assess for interval stability, resolution.   Baltazar Apo, MD, PhD 12/01/2021, 1:31 PM [facility] [phone] or if no answer before 7:00PM call [phone] For any issues after 7:00PM please call eLink [phone]

## 2021-12-07 ENCOUNTER — Other Ambulatory Visit: Payer: Self-pay | Admitting: Registered Nurse

## 2021-12-08 ENCOUNTER — Telehealth: Payer: Self-pay | Admitting: Registered Nurse

## 2021-12-08 NOTE — Telephone Encounter (Signed)
[place]:  Last Gabapentin was dispense on 11/17/2021.  Gabapentin prescription sent to pharmacy.  Call placed to Ms Crill, she also states she is taking her gabapentin.

## 2021-12-08 NOTE — Telephone Encounter (Signed)
Placed a call to Ms. Nolet, no answer. Left message to return the call.

## 2021-12-16 NOTE — Progress Notes (Signed)
Cardiology Office Note:    Date:  12/17/2021   ID:  Hannah Scott, DOB 05-07-1950, MRN [id number]  PCP:  Binnie Rail, MD   [place]  Cardiologist:  Freada Bergeron, MD  Advanced Practice Provider:  No care team member to display Electrophysiologist:  None   Referring MD: Binnie Rail, MD    History of Present Illness:    Hannah Scott is a 71 y.o. female with a hx of prior MI (1991, 66) secondary to coronary vasospasm, known distal LAD disease, HTN, HLD, atrial septal aneurysm, and asthma who was previously followed by Dr. Meda Coffee who presents to clinic for follow-up.   Saw Richardson Dopp on 06/05/20 where she was continuing to have shortness of breath with minimal activity over the past several months as well as chest/neck discomfort. Concerned for angina given history of vasospasm and known distal LAD disease in th past. Myoview 12/2019 with no ischemia; low risk. BNP on that visit 255.   Saw me on 06/19/20 where she continued to have SOB. TTE reassuringly normal with LVEF 60-65%, no WMA, no significant valve disease, RAP 3. Her amlodipine was stopped due to episodes of hypotension.   Was seen on 08/06/20 where she was continuing to have SOB that was worse with allergies. Saw pulm 10/29/20 who determined to continue to hold ACE and only use bystolic as BB. Was placed on prednisone at that time.  Was admitted to [facility] in 08/2021 for septic shock secondary to LE cellulitis and UTI. Required intubation at that time. Echocardiogram 09/11/2021 with LV function greater than 75, no regional wall motion abnormality.Statin was discontinued due to elevated LFTs. Troponin was elevated (peaked at 670) and treated with heparin but discontinued due to thrombocytopenia.  She was discharged to rehab.    She was readmitted to [facility] in 09/2021 after being found unresponsive and was hypoxic to the 30s. She was intubated.  She was treated for bilateral  pneumonia.  Also was overloaded on exam and required diuresis. Echocardiogram 09/25/2021 at [facility] with LV function of 60 to 65%, normal RV function, and moderate to severe pulmonary hypertension. RVSP 61 mm Hg.  Was last seen by Robbie Lis on 10/15/21 where she was continuing to recover. Remained on 1L [place].   Today, the patient is continuing to try to recover from her recent hospitalizations. Feels very fatigued and weak. O2 sats have been in 90s. Occasional LE edema but is much better controlled. Weight has remained 138-140lbs without significant weight gain. Compliant with torsemide '10mg'$  daily. Blood pressure has been running 100-120s/50-70s. No dizziness, orthopnea, PND, chest pain or palpitations. Tolerating medications.   Past Medical History:  Diagnosis Date   Anemia    hx   Arthritis    Asthma    PFTs, February, 2011, moderate obstructive disease with response to bronchodilators, normal lung volumes, moderate reduction in diffusing capacity   Atrial septal aneurysm    Echo, 2008-not noted on 13 echo   CAD (coronary artery disease)    90% distal LAD in the past  /   nuclear, 2008, no ischemia, ejection fraction 70%   Cancer ([place]) 2002   DUCTAL CIS--S/P LUMPECTOMY, RADIATION AND 6 WEEKS OF TAMOXIFEN   D-dimer, elevated    January, 2014   Depression    Ejection fraction    EF 60%, echo, October, 2008   Elevated CPK    January, 2014   Endometriosis 1989   RIGHT TUBE  Endometriosis 1987   LEFT TUBE/OVARY W FOCAL IN-SITU ENDOMETRIAL ADENOCARCINOMA   GERD (gastroesophageal reflux disease)    occ   H/O hiatal hernia    ?   Hyperlipidemia    Hypertension    Hypothyroidism    Patient has had in the past that she does not need treatment   Kyphoscoliosis    Obstructive airway disease ([place])    Pinched nerve    lower back   Shortness of breath    Echo 2/22: EF 60-65, no RWMA, mild LVH, GR 1  DD, GLS -24%, normal RVSF, mild MR, trivial AI, borderline dilation of aortic  root (39 mm)   UTI (lower urinary tract infection)     Past Surgical History:  Procedure Laterality Date   ANKLE SURGERY Left    ligament   APPENDECTOMY  1987   AT [facility]   BACK SURGERY     Fusion   BREAST LUMPECTOMY  2003   radiation on right   BREAST LUMPECTOMY WITH RADIOACTIVE SEED LOCALIZATION Left 02/12/2016   Procedure: LEFT BREAST LUMPECTOMY WITH RADIOACTIVE SEED LOCALIZATION;  Surgeon: Excell Seltzer, MD;  Location: [facility];  Service: General;  Laterality: Left;   CARDIOVASCULAR STRESS TEST  09/07/05   Nuclear, was negative   CATARACT EXTRACTION Bilateral 01,03   COLONOSCOPY WITH PROPOFOL N/A 05/04/2021   Procedure: COLONOSCOPY WITH PROPOFOL;  Surgeon: Gatha Mayer, MD;  Location: [facility];  Service: Endoscopy;  Laterality: N/A;   ESOPHAGOGASTRODUODENOSCOPY (EGD) WITH PROPOFOL N/A 05/04/2021   Procedure: ESOPHAGOGASTRODUODENOSCOPY (EGD) WITH PROPOFOL;  Surgeon: Gatha Mayer, MD;  Location: [facility];  Service: Endoscopy;  Laterality: N/A;   OOPHORECTOMY     LSO -RSO   PELVIC LAPAROSCOPY  1989   RSO, LYSIS OF ADHESIONS   POLYPECTOMY  05/04/2021   Procedure: POLYPECTOMY;  Surgeon: Gatha Mayer, MD;  Location: [facility];  Service: Endoscopy;;   TOTAL ABDOMINAL HYSTERECTOMY  1987   LSO, APPENDECTOMY   TOTAL HIP ARTHROPLASTY Right 10/28/2013   dr Lorin Mercy   TOTAL HIP ARTHROPLASTY Right 10/28/2013   Procedure: TOTAL HIP ARTHROPLASTY ANTERIOR APPROACH;  Surgeon: Marybelle Killings, MD;  Location: [facility];  Service: Orthopedics;  Laterality: Right;  Right Total Hip Arthroplasty, Direct Anterior Approach    Current Medications: Current Meds  Medication Sig   albuterol (VENTOLIN HFA) 108 (90 Base) MCG/ACT inhaler Inhale 2 puffs into the lungs every 4 (four) hours as needed for wheezing or shortness of breath.   aspirin EC 81 MG tablet Take 1 tablet (81 mg total) by mouth daily. Swallow whole.   atorvastatin (LIPITOR) 20 MG tablet Take 1 tablet by mouth  daily.   bisoprolol (ZEBETA) 5 MG tablet Take 5 mg by mouth daily.   DULoxetine (CYMBALTA) 30 MG capsule TAKE ONE CAPSULE EVERY DAY IN ADDITION TO '60MG'$  CAPSULE FOR A TOTAL OF '90MG'$  DAILY   DULoxetine (CYMBALTA) 60 MG capsule TAKE ONE CAPSULE BY MOUTH DAILY. TAKE ALONG WITH '30MG'$  CAPSULE. TOTAL DAILY DOSE = 90 MG.   famotidine (PEPCID) 20 MG tablet One after supper   Ferric Maltol (ACCRUFER) 30 MG CAPS Take 1 capsule by mouth in the morning and at bedtime. (Patient taking differently: Take 1 capsule by mouth daily.)   fluticasone furoate-vilanterol (BREO ELLIPTA) 100-25 MCG/INH AEPB Inhale 1 puff into the lungs daily.   gabapentin (NEURONTIN) 600 MG tablet TAKE ONE TABLET BY MOUTH EVERY DAY AT BEDTIME   ipratropium-albuterol (DUONEB) 0.5-2.5 (3) MG/3ML SOLN Take 3 mLs by nebulization  every 4 (four) hours as needed (wheezing or SOB). During exacerbation   irbesartan (AVAPRO) 75 MG tablet Take 1 tablet (75 mg total) by mouth daily.   levothyroxine (SYNTHROID) 100 MCG tablet Take 1 tablet (100 mcg total) by mouth daily.   torsemide (DEMADEX) 10 MG tablet Take 1 tablet (10 mg total) by mouth daily.   Vitamin D, Ergocalciferol, (DRISDOL) 1.25 MG (50000 UNIT) CAPS capsule TAKE ONE CAPSULE BY MOUTH ONCE A WEEK (Patient taking differently: Take 50,000 Units by mouth every 7 (seven) days.)   zinc gluconate 50 MG tablet Take 1 tablet (50 mg total) by mouth daily.   [DISCONTINUED] HYDROcodone-acetaminophen (NORCO) 10-325 MG tablet Take 1 tablet by mouth 3 (three) times daily as needed.     Allergies:   Fluticasone-salmeterol, Norvasc [amlodipine besylate], Tape, Heparin, and Morphine   Social History   Socioeconomic History   Marital status: Widowed    Spouse name: Not on file   Number of children: Not on file   Years of education: Not on file   Highest education level: Not on file  Occupational History   Not on file  Tobacco Use   Smoking status: Never    Passive exposure: Never   Smokeless  tobacco: Never  Vaping Use   Vaping Use: Never used  Substance and Sexual Activity   Alcohol use: No    Alcohol/week: 0.0 standard drinks of alcohol   Drug use: No   Sexual activity: Not Currently    Partners: Male    Birth control/protection: Surgical  Other Topics Concern   Not on file  Social History Narrative   Widowed in 2015   2 sons 1 lives in the area the other is in close contact    1 grandchild   Never smoker no drug use no alcohol   Social Determinants of Health   Financial Resource Strain: Low Risk  (10/08/2020)   Overall Financial Resource Strain (CARDIA)    Difficulty of Paying Living Expenses: Not hard at all  Food Insecurity: No Food Insecurity (10/08/2020)   Hunger Vital Sign    Worried About Running Out of Food in the Last Year: Never true    [place] in the Last Year: Never true  Transportation Needs: No Transportation Needs (10/08/2020)   PRAPARE - Hydrologist (Medical): No    Lack of Transportation (Non-Medical): No  Physical Activity: Inactive (10/08/2020)   Exercise Vital Sign    Days of Exercise per Week: 0 days    Minutes of Exercise per Session: 0 min  Stress: No Stress Concern Present (10/08/2020)   [place]    Feeling of Stress : Not at all  Social Connections: Moderately Integrated (10/08/2020)   Social Connection and Isolation Panel [NHANES]    Frequency of Communication with Friends and Family: More than three times a week    Frequency of Social Gatherings with Friends and Family: More than three times a week    Attends Religious Services: More than 4 times per year    Active Member of [employer] or Organizations: No    Attends Music therapist: More than 4 times per year    Marital Status: Widowed     Family History: The patient's  family history includes Asthma in her paternal uncle; Heart attack in her father and paternal uncle;  Heart disease in her father and mother; Heart failure in her mother; Hypertension in  her maternal grandfather and mother; Pneumonia in her mother; Stroke in her maternal grandfather.  ROS:   Please see the history of present illness.    Review of Systems  Constitutional:  Positive for malaise/fatigue.  HENT:  Negative for hearing loss.   Eyes:  Negative for redness.  Respiratory:  Positive for shortness of breath.   Cardiovascular:  Positive for leg swelling. Negative for chest pain, palpitations, orthopnea, claudication and PND.  Gastrointestinal:  Negative for melena, nausea and vomiting.  Genitourinary:  Negative for dysuria and flank pain.  Musculoskeletal:  Positive for joint pain and myalgias. Negative for falls.  Neurological:  Positive for weakness. Negative for dizziness and loss of consciousness.    EKGs/Labs/Other Studies Reviewed:    The following studies were reviewed today: TTE 09/24/2021: IMPRESSIONS     1. Left ventricular ejection fraction, by estimation, is >75%. The left  ventricle has hyperdynamic function. The left ventricle has no regional  wall motion abnormalities.   2. RV difficult to image In some views, appears dilated with depressed  function Would consider limited echo with [place] to visualize RV better  and define it's function. Right ventricular systolic function is low  normal. The right ventricular size is  normal.   3. Left atrial size was small.   4. Right atrial size was severely dilated.   5. The mitral valve is normal in structure. Trivial mitral valve  regurgitation.   6. The aortic valve is tricuspid. Aortic valve regurgitation is not  visualized.   7. The inferior vena cava is normal in size with greater than 50%  respiratory variability, suggesting right atrial pressure of 3 mmHg.   TTE 06/17/19: IMPRESSIONS   1. Left ventricular ejection fraction, by estimation, is 60 to 65%. The  left ventricle has normal function. The left  ventricle has no regional  wall motion abnormalities. There is mild left ventricular hypertrophy.  Left ventricular diastolic parameters  are consistent with Grade I diastolic dysfunction (impaired relaxation).  The average left ventricular global longitudinal strain is -24.0 %. The  global longitudinal strain is normal.   2. Right ventricular systolic function is normal. The right ventricular  size is normal. Tricuspid regurgitation signal is inadequate for assessing  PA pressure.   3. The mitral valve is normal in structure. Mild mitral valve  regurgitation. No evidence of mitral stenosis.   4. The aortic valve was not well visualized. Aortic valve regurgitation  is trivial. No aortic stenosis is present.   5. There is borderline dilatation of the aortic root, measuring 39 mm.   6. The inferior vena cava is normal in size with greater than 50%  respiratory variability, suggesting right atrial pressure of 3 mmHg.    TTE 2019: Study Conclusions   - Left ventricle: The cavity size was normal. Wall thickness was    increased in a pattern of moderate LVH. Systolic function was    normal. The estimated ejection fraction was in the range of 55%    to 60%. Wall motion was normal; there were no regional wall    motion abnormalities. Doppler parameters are consistent with    abnormal left ventricular relaxation (grade 1 diastolic    dysfunction).  - Aortic valve: There was trivial regurgitation.  - Ascending aorta: The ascending aorta was mildly dilated.  - Mitral valve: Calcified annulus. Mildly thickened leaflets .   Impressions:   - Normal LV systolic function; moderate LVH; mild diastolic    dysfunction; trace AI; mildly  dilated ascending aorta.    Myoview 12/2019: The left ventricular ejection fraction is normal (55-65%). Nuclear stress EF: 64%. No wall motion abnormalities There was no ST segment deviation noted during stress. The study is normal. This is a low risk study. There  is no infarct or ischemia identified.   ECG: No new tracing today  Recent Labs: 09/12/2021: B Natriuretic Peptide 644.8 09/20/2021: Magnesium 1.6 10/29/2021: Hemoglobin 10.4; Platelets 255.0; TSH 2.58 11/03/2021: ALT 13; BUN 18; Creatinine, Ser 0.64; Potassium 4.2; Sodium 141  Recent Lipid Panel    Component Value Date/Time   CHOL 120 07/19/2021 1445   TRIG 81.0 07/19/2021 1445   HDL 53.90 07/19/2021 1445   CHOLHDL 2 07/19/2021 1445   VLDL 16.2 07/19/2021 1445   LDLCALC 50 07/19/2021 1445   LDLCALC 61 12/13/2019 1430   LDLDIRECT 154.8 02/20/2009 1116     Risk Assessment/Calculations:       Physical Exam:    VS:  BP (!) 102/56   Pulse 79   Ht 5' (1.524 m)   Wt 138 lb 9.6 oz (62.9 kg)   SpO2 91%   BMI 27.07 kg/m     Wt Readings from Last 3 Encounters:  12/17/21 138 lb (62.6 kg)  12/17/21 138 lb 9.6 oz (62.9 kg)  12/01/21 145 lb 6.4 oz (66 kg)     GEN:  Elderly female, O2 in place, comfortable HEENT: Normal NECK: No JVD; No carotid bruits. Scoliosis present CARDIAC: RR, 2/6 systolic murmur. No rubs or gallops RESPIRATORY:  CTAB, no wheezes ABDOMEN: Soft, non-tender, non-distended MUSCULOSKELETAL:  Trace LE edema; No deformity  SKIN: Warm and dry NEUROLOGIC:  Alert and oriented x 3 PSYCHIATRIC:  Normal affect   ASSESSMENT:    1. Chronic respiratory failure, unspecified whether with hypoxia or hypercapnia ([place])   2. Coronary artery disease involving native coronary artery of native heart without angina pectoris   3. Chronic diastolic CHF (congestive heart failure) ([place])   4. Essential hypertension   5. Vasospasm ([place])   6. Mixed hyperlipidemia      PLAN:    In order of problems listed above:  #CAD with distal LAD #Vasospasm: Myoview 12/2019 with no evidence of ischemia. TTE with normal BiV function, no significant valvular abnormalities.  Was trialed on amlodipine for possible vasospasm, however, no significant change in symptoms and patient has noted  more frequent episodes of hypotension. Given reassuring cardiac work-up, no further testing needed at this time.  -Cardiac work-up including myoview and TTE reassuringly normal -Continue ASA and statin -Metop changed to bystolic '5mg'$  daily as most beta 1 selective and less likely to exacerbate her asthma -Amlodipine stopped due to hypotension.   #Chronic Diastolic HF: Last TTE with LVEF 60-65%. Currently euvolemic and compensated. -Continue torsemide '10mg'$  daily -Continue bisoprolol '5mg'$  daily - Low Na diet - Monitor weights  #Severe Pulmonary HTN: Likely mixed etiology in the setting of diastolic HF and underlying pulm disease. Currently euvolemic on exam. Follows with pulm. -Manage HF as above -Follow-up with Pulm as scheduled  #Chronic Respiratory Failure: #Asthma: Multifactorial with restrictive lung disease, asthma, diastolic dysfunction and obesity on top of deconditioning from recent critical illness. Follows with Pulm. Remains on supplemental O2.   -Follow-up with Pulm as scheduled -Continue supplemental O2 and inhalers  #HTN: Well controlled at home -Stopped amlodipine given episodes of hypotension  -Stopped benzapril due to concern for contribution to cough -Continue irbesartan '75mg'$  daily -Metop changed to bystolic '5mg'$  daily due to more beta-1 selectivity -Monitor blood  pressures at home   #HLD: LDL 61. -Continue atorvastatin '20mg'$  daily   Medication Adjustments/Labs and Tests Ordered: Current medicines are reviewed at length with the patient today.  Concerns regarding medicines are outlined above.  No orders of the defined types were placed in this encounter.  No orders of the defined types were placed in this encounter.   Patient Instructions  Medication Instructions:  Your physician recommends that you continue on your current medications as directed. Please refer to the Current Medication list given to you today.  *If you need a refill on your cardiac  medications before your next appointment, please call your pharmacy*   Lab Work: None If you have labs (blood work) drawn today and your tests are completely normal, you will receive your results only by: [place] (if you have MyChart) OR A paper copy in the mail If you have any lab test that is abnormal or we need to change your treatment, we will call you to review the results.   Follow-Up: At [facility], you and your health needs are our priority.  As part of our continuing mission to provide you with exceptional heart care, we have created designated Provider Care Teams.  These Care Teams include your primary Cardiologist (physician) and Advanced Practice Providers (APPs -  Physician Assistants and Nurse Practitioners) who all work together to provide you with the care you need, when you need it.  Your next appointment:   4 month(s)  The format for your next appointment:   In Person  Provider:   Freada Bergeron, MD {  Important Information About Sugar          Signed, Freada Bergeron, MD  12/17/2021 11:36 AM    [place]

## 2021-12-17 ENCOUNTER — Encounter: Payer: Self-pay | Admitting: Registered Nurse

## 2021-12-17 ENCOUNTER — Encounter: Payer: Medicare Other | Admitting: Registered Nurse

## 2021-12-17 ENCOUNTER — Encounter: Payer: Self-pay | Admitting: Cardiology

## 2021-12-17 ENCOUNTER — Ambulatory Visit: Payer: Medicare Other | Admitting: Cardiology

## 2021-12-17 VITALS — BP 102/56 | HR 79 | Ht 60.0 in | Wt 138.6 lb

## 2021-12-17 VITALS — BP 114/72 | HR 71 | Ht 60.0 in | Wt 138.0 lb

## 2021-12-17 DIAGNOSIS — I2510 Atherosclerotic heart disease of native coronary artery without angina pectoris: Secondary | ICD-10-CM

## 2021-12-17 DIAGNOSIS — J9610 Chronic respiratory failure, unspecified whether with hypoxia or hypercapnia: Secondary | ICD-10-CM | POA: Diagnosis not present

## 2021-12-17 DIAGNOSIS — Z79891 Long term (current) use of opiate analgesic: Secondary | ICD-10-CM | POA: Diagnosis not present

## 2021-12-17 DIAGNOSIS — M961 Postlaminectomy syndrome, not elsewhere classified: Secondary | ICD-10-CM

## 2021-12-17 DIAGNOSIS — I10 Essential (primary) hypertension: Secondary | ICD-10-CM | POA: Diagnosis not present

## 2021-12-17 DIAGNOSIS — G894 Chronic pain syndrome: Secondary | ICD-10-CM

## 2021-12-17 DIAGNOSIS — I5032 Chronic diastolic (congestive) heart failure: Secondary | ICD-10-CM | POA: Diagnosis not present

## 2021-12-17 DIAGNOSIS — M48062 Spinal stenosis, lumbar region with neurogenic claudication: Secondary | ICD-10-CM | POA: Diagnosis not present

## 2021-12-17 DIAGNOSIS — E782 Mixed hyperlipidemia: Secondary | ICD-10-CM

## 2021-12-17 DIAGNOSIS — I739 Peripheral vascular disease, unspecified: Secondary | ICD-10-CM

## 2021-12-17 DIAGNOSIS — Z5181 Encounter for therapeutic drug level monitoring: Secondary | ICD-10-CM

## 2021-12-17 DIAGNOSIS — M24551 Contracture, right hip: Secondary | ICD-10-CM

## 2021-12-17 MED ORDER — HYDROCODONE-ACETAMINOPHEN 10-325 MG PO TABS
1.0000 | ORAL_TABLET | Freq: Three times a day (TID) | ORAL | 0 refills | Status: DC | PRN
Start: 2021-12-17 — End: 2022-02-08

## 2021-12-17 NOTE — Patient Instructions (Signed)
Medication Instructions:  Your physician recommends that you continue on your current medications as directed. Please refer to the Current Medication list given to you today.  *If you need a refill on your cardiac medications before your next appointment, please call your pharmacy*   Lab Work: None If you have labs (blood work) drawn today and your tests are completely normal, you will receive your results only by: [place] (if you have MyChart) OR A paper copy in the mail If you have any lab test that is abnormal or we need to change your treatment, we will call you to review the results.   Follow-Up: At [facility], you and your health needs are our priority.  As part of our continuing mission to provide you with exceptional heart care, we have created designated Provider Care Teams.  These Care Teams include your primary Cardiologist (physician) and Advanced Practice Providers (APPs -  Physician Assistants and Nurse Practitioners) who all work together to provide you with the care you need, when you need it.  Your next appointment:   4 month(s)  The format for your next appointment:   In Person  Provider:   Freada Bergeron, MD {  Important Information About Sugar

## 2021-12-17 NOTE — Patient Instructions (Signed)
My Chart Phone Number   [phone]

## 2021-12-17 NOTE — Progress Notes (Signed)
Subjective:    Patient ID: Hannah Scott, female    DOB: 12-Jun-1950, 71 y.o.   MRN: [id number]  HPI: Hannah Scott is a 71 y.o. female who returns for follow up appointment for chronic pain and medication refill. She states her pain is located in her lower back. She. rates her pain 7. Her current exercise regime is walking short sistances with her walker, she reports her physical therapy was completed.   Hannah Scott Morphine equivalent is 30.00 MME.   Oral Swab was Ordered today.   Hannah Scott son in the room.     Pain Inventory Average Pain 7 Pain Right Now 7 My pain is constant, burning, tingling, and aching  In the last 24 hours, has pain interfered with the following? General activity 10 Relation with others 10 Enjoyment of life 10 What TIME of day is your pain at its worst? morning , daytime, evening, and night Sleep (in general) Poor  Pain is worse with: walking and standing Pain improves with: rest and medication Relief from Meds: 6  Family History  Problem Relation Age of Onset   Heart failure Mother    Pneumonia Mother    Hypertension Mother    Heart disease Mother    Stroke Maternal Grandfather    Hypertension Maternal Grandfather    Heart attack Father    Heart disease Father    Asthma Paternal Uncle        PAT UNCLES   Heart attack Paternal Uncle    Social History   Socioeconomic History   Marital status: Widowed    Spouse name: Not on file   Number of children: Not on file   Years of education: Not on file   Highest education level: Not on file  Occupational History   Not on file  Tobacco Use   Smoking status: Never    Passive exposure: Never   Smokeless tobacco: Never  Vaping Use   Vaping Use: Never used  Substance and Sexual Activity   Alcohol use: No    Alcohol/week: 0.0 standard drinks of alcohol   Drug use: No   Sexual activity: Not Currently    Partners: Male    Birth control/protection: Surgical  Other Topics Concern   Not on file  Social  History Narrative   Widowed in 2015   2 sons 1 lives in the area the other is in close contact    1 grandchild   Never smoker no drug use no alcohol   Social Determinants of Health   Financial Resource Strain: Low Risk  (10/08/2020)   Overall Financial Resource Strain (CARDIA)    Difficulty of Paying Living Expenses: Not hard at all  Food Insecurity: No Food Insecurity (10/08/2020)   Hunger Vital Sign    Worried About Running Out of Food in the Last Year: Never true    [place] in the Last Year: Never true  Transportation Needs: No Transportation Needs (10/08/2020)   PRAPARE - Hydrologist (Medical): No    Lack of Transportation (Non-Medical): No  Physical Activity: Inactive (10/08/2020)   Exercise Vital Sign    Days of Exercise per Week: 0 days    Minutes of Exercise per Session: 0 min  Stress: No Stress Concern Present (10/08/2020)   [place]    Feeling of Stress : Not at all  Social Connections: Moderately Integrated (10/08/2020)   Social Connection and  Isolation Panel [NHANES]    Frequency of Communication with Friends and Family: More than three times a week    Frequency of Social Gatherings with Friends and Family: More than three times a week    Attends Religious Services: More than 4 times per year    Active Member of Clubs or Organizations: No    Attends Music therapist: More than 4 times per year    Marital Status: Widowed   Past Surgical History:  Procedure Laterality Date   ANKLE SURGERY Left    ligament   APPENDECTOMY  1987   AT [facility]   BACK SURGERY     Fusion   BREAST LUMPECTOMY  2003   radiation on right   BREAST LUMPECTOMY WITH RADIOACTIVE SEED LOCALIZATION Left 02/12/2016   Procedure: LEFT BREAST LUMPECTOMY WITH RADIOACTIVE SEED LOCALIZATION;  Surgeon: Excell Seltzer, MD;  Location: [facility];  Service: General;  Laterality:  Left;   CARDIOVASCULAR STRESS TEST  09/07/05   Nuclear, was negative   CATARACT EXTRACTION Bilateral 01,03   COLONOSCOPY WITH PROPOFOL N/A 05/04/2021   Procedure: COLONOSCOPY WITH PROPOFOL;  Surgeon: Gatha Mayer, MD;  Location: [facility];  Service: Endoscopy;  Laterality: N/A;   ESOPHAGOGASTRODUODENOSCOPY (EGD) WITH PROPOFOL N/A 05/04/2021   Procedure: ESOPHAGOGASTRODUODENOSCOPY (EGD) WITH PROPOFOL;  Surgeon: Gatha Mayer, MD;  Location: [facility];  Service: Endoscopy;  Laterality: N/A;   OOPHORECTOMY     [facility]O -RSO   PELVIC LAPAROSCOPY  1989   RSO, LYSIS OF ADHESIONS   POLYPECTOMY  05/04/2021   Procedure: POLYPECTOMY;  Surgeon: Gatha Mayer, MD;  Location: [facility];  Service: Endoscopy;;   TOTAL ABDOMINAL HYSTERECTOMY  1987   [facility], APPENDECTOMY   TOTAL HIP ARTHROPLASTY Right 10/28/2013   dr Lorin Mercy   TOTAL HIP ARTHROPLASTY Right 10/28/2013   Procedure: TOTAL HIP ARTHROPLASTY ANTERIOR APPROACH;  Surgeon: Marybelle Killings, MD;  Location: [facility];  Service: Orthopedics;  Laterality: Right;  Right Total Hip Arthroplasty, Direct Anterior Approach   Past Surgical History:  Procedure Laterality Date   ANKLE SURGERY Left    ligament   APPENDECTOMY  1987   AT [facility]   BACK SURGERY     Fusion   BREAST LUMPECTOMY  2003   radiation on right   BREAST LUMPECTOMY WITH RADIOACTIVE SEED LOCALIZATION Left 02/12/2016   Procedure: LEFT BREAST LUMPECTOMY WITH RADIOACTIVE SEED LOCALIZATION;  Surgeon: Excell Seltzer, MD;  Location: [facility];  Service: General;  Laterality: Left;   CARDIOVASCULAR STRESS TEST  09/07/05   Nuclear, was negative   CATARACT EXTRACTION Bilateral 01,03   COLONOSCOPY WITH PROPOFOL N/A 05/04/2021   Procedure: COLONOSCOPY WITH PROPOFOL;  Surgeon: Gatha Mayer, MD;  [facility];  Service: Endoscopy;  Laterality: N/A;   ESOPHAGOGASTRODUODENOSCOPY (EGD) WITH PROPOFOL N/A 05/04/2021   Procedure: ESOPHAGOGASTRODUODENOSCOPY (EGD) WITH PROPOFOL;   Surgeon: Gatha Mayer, MD;  [facility];  Service: Endoscopy;  Laterality: N/A;   OOPHORECTOMY     LSO -RSO   PELVIC LAPAROSCOPY  1989   RSO, LYSIS OF ADHESIONS   POLYPECTOMY  05/04/2021   Procedure: POLYPECTOMY;  Surgeon: Gatha Mayer, MD;  [facility]: [facility];  Service: Endoscopy;;   TOTAL ABDOMINAL HYSTERECTOMY  1987   LSO, APPENDECTOMY   TOTAL HIP ARTHROPLASTY Right 10/28/2013   dr Lorin Mercy   TOTAL HIP ARTHROPLASTY Right 10/28/2013   Procedure: TOTAL HIP ARTHROPLASTY ANTERIOR APPROACH;  Surgeon: Marybelle Killings, MD;  Location: [facility];  Service:  Orthopedics;  Laterality: Right;  Right Total Hip Arthroplasty, Direct Anterior Approach   Past Medical History:  Diagnosis Date   Anemia    hx   Arthritis    Asthma    PFTs, February, 2011, moderate obstructive disease with response to bronchodilators, normal lung volumes, moderate reduction in diffusing capacity   Atrial septal aneurysm    Echo, 2008-not noted on 13 echo   CAD (coronary artery disease)    90% distal LAD in the past  /   nuclear, 2008, no ischemia, ejection fraction 70%   Cancer ([place]) 2002   DUCTAL CIS--S/P LUMPECTOMY, RADIATION AND 6 WEEKS OF TAMOXIFEN   D-dimer, elevated    January, 2014   Depression    Ejection fraction    EF 60%, echo, October, 2008   Elevated CPK    January, 2014   Endometriosis 1989   RIGHT TUBE   Endometriosis 1987   LEFT TUBE/OVARY W FOCAL IN-SITU ENDOMETRIAL ADENOCARCINOMA   GERD (gastroesophageal reflux disease)    occ   H/O hiatal hernia    ?   Hyperlipidemia    Hypertension    Hypothyroidism    Patient has had in the past that she does not need treatment   Kyphoscoliosis    Obstructive airway disease ([place])    Pinched nerve    lower back   Shortness of breath    Echo 2/22: EF 60-65, no RWMA, mild LVH, GR 1  DD, GLS -24%, normal RVSF, mild MR, trivial AI, borderline dilation of aortic root (39 mm)   UTI (lower urinary tract infection)    BP 114/72   Pulse 71   Ht  5' (1.524 m)   Wt 138 lb (62.6 kg)   SpO2 95% Comment: .5 liter of oxygen  BMI 26.95 kg/m   Opioid Risk Score:   Fall Risk Score:  `1  Depression screen [facility] 2/9     12/17/2021   10:44 AM 11/26/2021    1:23 PM 08/13/2021   11:36 AM 07/20/2021    1:27 PM 06/22/2021    2:48 PM 05/25/2021    2:27 PM 03/11/2021   12:48 PM  Depression screen PHQ 2/9  Decreased Interest 0 '3 3 1 1 '$ 0 3  Down, Depressed, Hopeless 0 '3 3 1 1 1 3  '$ PHQ - 2 Score 0 '6 6 2 2 1 6  '$ Altered sleeping  2       Tired, decreased energy  3       Change in appetite  3       Feeling bad or failure about yourself   0       Trouble concentrating  0       Moving slowly or fidgety/restless  0       Suicidal thoughts  0       PHQ-9 Score  14       Difficult doing work/chores  Somewhat difficult          Review of Systems  Musculoskeletal:  Positive for back pain.       Bilateral knee pain   All other systems reviewed and are negative.     Objective:   Physical Exam Vitals and nursing note reviewed.  Constitutional:      Appearance: Normal appearance.  Cardiovascular:     Rate and Rhythm: Normal rate and regular rhythm.     Pulses: Normal pulses.     Heart sounds: Normal heart sounds.  Pulmonary:  Effort: Pulmonary effort is normal.     Breath sounds: Normal breath sounds.  Musculoskeletal:     Cervical back: Normal range of motion and neck supple.     Comments: Normal Muscle Bulk and Muscle Testing Reveals:  Upper Extremities: Full ROM and Muscle Strength 5/5  Lumbar Paraspinal Tenderness: L-3-L-5 Lower Extremities: Full ROM and Muscle Strength 5/5 Arrived with Wheelchair     Skin:    General: Skin is warm and dry.  Neurological:     Mental Status: She is alert and oriented to person, place, and time.  Psychiatric:        Mood and Affect: Mood normal.        Behavior: Behavior normal.         Assessment & Plan:  1. Lumbar post laminectomy syndrome with severe kyphoscoliosis thoracolumbar spine,  s/p lumbar fusion/  12/17/2021 Continue current medication regimen. Continue  Hydrocodone 10/'325mg'$  one tablet every 8 hours may take an extra tablet when pain is severe #100. We will continue the opioid monitoring program, this consists of regular clinic visits, examinations, urine drug screen, pill counts as well as use of [place] Controlled Substance Reporting system. A 12 month History has been reviewed on the [place] Controlled Substance Reporting System on 12/17/2021 2. Right Hip OA: S/P Right Hip Replacement 10/28/2013. 12/17/2021 3.Depression: Continue Cymbalta. Has Family Support and Friends.  Counseling with Doristine Bosworth. 12/17/2021. 4. Right Greater Trochanteric Tenderness: No complaints today. Continue to Monitor. Continue with Ice and Heat Therapy. 12/17/2021 5. Poor Appetite/ Frail> Ms. Swim states PCP following. Continue to monitor. 12/17/2021 6. Chronic Bilateral Knee Pain: No complaints today .Continue HEP as Tolerated. Continue to Monitor. 12/17/2021   F/U in 1 month

## 2021-12-20 DIAGNOSIS — J449 Chronic obstructive pulmonary disease, unspecified: Secondary | ICD-10-CM | POA: Diagnosis not present

## 2021-12-20 DIAGNOSIS — I5020 Unspecified systolic (congestive) heart failure: Secondary | ICD-10-CM | POA: Diagnosis not present

## 2021-12-22 DIAGNOSIS — J449 Chronic obstructive pulmonary disease, unspecified: Secondary | ICD-10-CM | POA: Diagnosis not present

## 2021-12-22 DIAGNOSIS — I5020 Unspecified systolic (congestive) heart failure: Secondary | ICD-10-CM | POA: Diagnosis not present

## 2021-12-23 LAB — DRUG TOX MONITOR 1 W/CONF, ORAL FLD
Amphetamines: NEGATIVE ng/mL (ref <10)
Barbiturates: NEGATIVE ng/mL (ref <10)
Benzodiazepines: NEGATIVE ng/mL (ref <0.50)
Buprenorphine: NEGATIVE ng/mL (ref <0.10)
Cocaine: NEGATIVE ng/mL (ref <5.0)
Codeine: NEGATIVE ng/mL (ref <2.5)
Dihydrocodeine: 6.7 ng/mL — ABNORMAL HIGH (ref <2.5)
Fentanyl: NEGATIVE ng/mL (ref <0.10)
Heroin Metabolite: NEGATIVE ng/mL (ref <1.0)
Hydrocodone: 62.5 ng/mL — ABNORMAL HIGH (ref <2.5)
Hydromorphone: NEGATIVE ng/mL (ref <2.5)
MARIJUANA: NEGATIVE ng/mL (ref <2.5)
MDMA: NEGATIVE ng/mL (ref <10)
Meprobamate: NEGATIVE ng/mL (ref <2.5)
Methadone: NEGATIVE ng/mL (ref <5.0)
Morphine: NEGATIVE ng/mL (ref <2.5)
Nicotine Metabolite: NEGATIVE ng/mL (ref <5.0)
Norhydrocodone: NEGATIVE ng/mL (ref <2.5)
Noroxycodone: NEGATIVE ng/mL (ref <2.5)
Opiates: POSITIVE ng/mL — AB (ref <2.5)
Oxycodone: NEGATIVE ng/mL (ref <2.5)
Oxymorphone: NEGATIVE ng/mL (ref <2.5)
Phencyclidine: NEGATIVE ng/mL (ref <10)
Tapentadol: NEGATIVE ng/mL (ref <5.0)
Tramadol: NEGATIVE ng/mL (ref <5.0)
Zolpidem: NEGATIVE ng/mL (ref <5.0)

## 2021-12-23 LAB — DRUG TOX ALC METAB W/CON, ORAL FLD: Alcohol Metabolite: NEGATIVE ng/mL (ref <25)

## 2021-12-24 ENCOUNTER — Telehealth: Payer: Self-pay | Admitting: *Deleted

## 2021-12-24 NOTE — Telephone Encounter (Signed)
Oral swab drug screen was consistent for prescribed medications.  ?

## 2021-12-29 ENCOUNTER — Encounter: Payer: Self-pay | Admitting: Internal Medicine

## 2021-12-29 DIAGNOSIS — R0609 Other forms of dyspnea: Secondary | ICD-10-CM

## 2021-12-30 DIAGNOSIS — I5020 Unspecified systolic (congestive) heart failure: Secondary | ICD-10-CM | POA: Diagnosis not present

## 2021-12-30 DIAGNOSIS — J449 Chronic obstructive pulmonary disease, unspecified: Secondary | ICD-10-CM | POA: Diagnosis not present

## 2021-12-31 ENCOUNTER — Ambulatory Visit: Payer: Medicare Other | Admitting: Registered Nurse

## 2022-01-02 MED ORDER — FAMOTIDINE 20 MG PO TABS: ORAL_TABLET | ORAL | 2 refills | Status: DC

## 2022-01-03 ENCOUNTER — Other Ambulatory Visit: Payer: Self-pay | Admitting: Internal Medicine

## 2022-01-11 ENCOUNTER — Other Ambulatory Visit: Payer: Self-pay

## 2022-01-11 ENCOUNTER — Ambulatory Visit (INDEPENDENT_AMBULATORY_CARE_PROVIDER_SITE_OTHER): Payer: Medicare Other

## 2022-01-11 ENCOUNTER — Telehealth: Payer: Self-pay

## 2022-01-11 VITALS — Ht 60.0 in | Wt 133.0 lb

## 2022-01-11 DIAGNOSIS — Z0000 Encounter for general adult medical examination without abnormal findings: Secondary | ICD-10-CM

## 2022-01-11 DIAGNOSIS — Z1239 Encounter for other screening for malignant neoplasm of breast: Secondary | ICD-10-CM

## 2022-01-11 DIAGNOSIS — Z13820 Encounter for screening for osteoporosis: Secondary | ICD-10-CM | POA: Diagnosis not present

## 2022-01-11 MED ORDER — ATORVASTATIN CALCIUM 20 MG PO TABS: 20.0000 mg | ORAL_TABLET | Freq: Every day | ORAL | 3 refills | Status: DC

## 2022-01-11 NOTE — Progress Notes (Addendum)
Virtual Visit via Telephone Note  I connected with  Hannah Scott on 01/11/22 at  2:00 PM EDT by telephone and verified that I am speaking with the correct person using two identifiers.  Location: Patient: Home Provider: [facility] Persons participating in the virtual visit: [place]   I discussed the limitations, risks, security and privacy concerns of performing an evaluation and management service by telephone and the availability of in person appointments. The patient expressed understanding and agreed to proceed.  Interactive audio and video telecommunications were attempted between this nurse and patient, however failed, due to patient having technical difficulties OR patient did not have access to video capability.  We continued and completed visit with audio only.  Some vital signs may be absent or patient reported.   Sheral Flow, LPN  Subjective:   Hannah Scott is a 71 y.o. female who presents for Medicare Annual (Subsequent) preventive examination.  Review of Systems     Cardiac Risk Factors include: advanced age (>76mn, >>49women);dyslipidemia;family history of premature cardiovascular disease;hypertension;sedentary lifestyle     Objective:    Today's Vitals   01/11/22 1413  Weight: 133 lb (60.3 kg)  Height: 5' (1.524 m)  PainSc: 6   PainLoc: Back   Body mass index is 25.97 kg/m.     01/11/2022    2:15 PM 05/25/2021    2:28 PM 05/04/2021    6:58 AM 10/08/2020    1:42 PM 09/11/2019    1:32 PM 01/27/2017    2:57 PM 01/10/2017    3:31 PM  Advanced Directives  Does Patient Have a Medical Advance Directive? Yes No No No No Yes No  Type of [place]        [place]? No - copy requested        Would patient like information on creating a medical advance directive?   No - Patient declined Yes (MAU/Ambulatory/Procedural Areas - Information given) No -  Patient declined  Yes (ED - Information included in AVS)    Current Medications (verified) Outpatient Encounter Medications as of 01/11/2022  Medication Sig   albuterol (VENTOLIN HFA) 108 (90 Base) MCG/ACT inhaler Inhale 2 puffs into the lungs every 4 (four) hours as needed for wheezing or shortness of breath.   aspirin EC 81 MG tablet Take 1 tablet (81 mg total) by mouth daily. Swallow whole.   bisoprolol (ZEBETA) 5 MG tablet Take 5 mg by mouth daily.   DULoxetine (CYMBALTA) 30 MG capsule TAKE ONE CAPSULE EVERY DAY IN ADDITION TO '60MG'$  CAPSULE FOR A TOTAL OF '90MG'$  DAILY   DULoxetine (CYMBALTA) 60 MG capsule TAKE ONE CAPSULE BY MOUTH DAILY. TAKE ALONG WITH '30MG'$  CAPSULE. TOTAL DAILY DOSE = 90 MG.   famotidine (PEPCID) 20 MG tablet One after supper   Ferric Maltol (ACCRUFER) 30 MG CAPS Take 1 capsule by mouth in the morning and at bedtime. (Patient taking differently: Take 1 capsule by mouth daily.)   fluticasone furoate-vilanterol (BREO ELLIPTA) 100-25 MCG/INH AEPB Inhale 1 puff into the lungs daily.   gabapentin (NEURONTIN) 600 MG tablet TAKE ONE TABLET BY MOUTH EVERY DAY AT BEDTIME   HYDROcodone-acetaminophen (NORCO) 10-325 MG tablet Take 1 tablet by mouth 3 (three) times daily as needed.   ipratropium-albuterol (DUONEB) 0.5-2.5 (3) MG/3ML SOLN Take 3 mLs by nebulization every 4 (four) hours as needed (wheezing or SOB). During exacerbation   irbesartan (AVAPRO) 75 MG tablet Take  1 tablet (75 mg total) by mouth daily.   levothyroxine (SYNTHROID) 100 MCG tablet Take 1 tablet (100 mcg total) by mouth daily.   torsemide (DEMADEX) 10 MG tablet Take 1 tablet (10 mg total) by mouth daily.   Vitamin D, Ergocalciferol, (DRISDOL) 1.25 MG (50000 UNIT) CAPS capsule TAKE ONE CAPSULE BY MOUTH ONCE A WEEK (Patient taking differently: Take 50,000 Units by mouth every 7 (seven) days.)   zinc gluconate 50 MG tablet Take 1 tablet (50 mg total) by mouth daily.   [DISCONTINUED] atorvastatin (LIPITOR) 20 MG tablet  Take 1 tablet by mouth daily.   No facility-administered encounter medications on file as of 01/11/2022.    Allergies (verified) Fluticasone-salmeterol, Norvasc [amlodipine besylate], Tape, Heparin, and Morphine   History: Past Medical History:  Diagnosis Date   Anemia    hx   Arthritis    Asthma    PFTs, February, 2011, moderate obstructive disease with response to bronchodilators, normal lung volumes, moderate reduction in diffusing capacity   Atrial septal aneurysm    Echo, 2008-not noted on 13 echo   CAD (coronary artery disease)    90% distal LAD in the past  /   nuclear, 2008, no ischemia, ejection fraction 70%   Cancer ([place]) 2002   DUCTAL CIS--S/P LUMPECTOMY, RADIATION AND 6 WEEKS OF TAMOXIFEN   D-dimer, elevated    January, 2014   Depression    Ejection fraction    EF 60%, echo, October, 2008   Elevated CPK    January, 2014   Endometriosis 1989   RIGHT TUBE   Endometriosis 1987   LEFT TUBE/OVARY W FOCAL IN-SITU ENDOMETRIAL ADENOCARCINOMA   GERD (gastroesophageal reflux disease)    occ   H/O hiatal hernia    ?   Hyperlipidemia    Hypertension    Hypothyroidism    Patient has had in the past that she does not need treatment   Kyphoscoliosis    Obstructive airway disease ([place])    Pinched nerve    lower back   Shortness of breath    Echo 2/22: EF 60-65, no RWMA, mild LVH, GR 1  DD, GLS -24%, normal RVSF, mild MR, trivial AI, borderline dilation of aortic root (39 mm)   UTI (lower urinary tract infection)    Past Surgical History:  Procedure Laterality Date   ANKLE SURGERY Left    ligament   APPENDECTOMY  1987   AT [facility]   BACK SURGERY     Fusion   BREAST LUMPECTOMY  2003   radiation on right   BREAST LUMPECTOMY WITH RADIOACTIVE SEED LOCALIZATION Left 02/12/2016   Procedure: LEFT BREAST LUMPECTOMY WITH RADIOACTIVE SEED LOCALIZATION;  Surgeon: Excell Seltzer, MD;  Location: [facility];  Service: General;  Laterality: Left;   CARDIOVASCULAR  STRESS TEST  09/07/05   Nuclear, was negative   CATARACT EXTRACTION Bilateral 01,03   COLONOSCOPY WITH PROPOFOL N/A 05/04/2021   Procedure: COLONOSCOPY WITH PROPOFOL;  Surgeon: Gatha Mayer, MD;  Location: [facility];  Service: Endoscopy;  Laterality: N/A;   ESOPHAGOGASTRODUODENOSCOPY (EGD) WITH PROPOFOL N/A 05/04/2021   Procedure: ESOPHAGOGASTRODUODENOSCOPY (EGD) WITH PROPOFOL;  Surgeon: Gatha Mayer, MD;  Location: [facility];  Service: Endoscopy;  Laterality: N/A;   OOPHORECTOMY     LSO -RSO   PELVIC LAPAROSCOPY  1989   RSO, LYSIS OF ADHESIONS   POLYPECTOMY  05/04/2021   Procedure: POLYPECTOMY;  Surgeon: Gatha Mayer, MD;  Location: [facility];  Service: Endoscopy;;   TOTAL ABDOMINAL  HYSTERECTOMY  1987   LSO, APPENDECTOMY   TOTAL HIP ARTHROPLASTY Right 10/28/2013   dr Lorin Mercy   TOTAL HIP ARTHROPLASTY Right 10/28/2013   Procedure: TOTAL HIP ARTHROPLASTY ANTERIOR APPROACH;  Surgeon: Marybelle Killings, MD;  Location: [facility];  Service: Orthopedics;  Laterality: Right;  Right Total Hip Arthroplasty, Direct Anterior Approach   Family History  Problem Relation Age of Onset   Heart failure Mother    Pneumonia Mother    Hypertension Mother    Heart disease Mother    Stroke Maternal Grandfather    Hypertension Maternal Grandfather    Heart attack Father    Heart disease Father    Asthma Paternal Uncle        PAT UNCLES   Heart attack Paternal Uncle    Social History   Socioeconomic History   Marital status: Widowed    Spouse name: Not on file   Number of children: Not on file   Years of education: Not on file   Highest education level: Not on file  Occupational History   Not on file  Tobacco Use   Smoking status: Never    Passive exposure: Never   Smokeless tobacco: Never  Vaping Use   Vaping Use: Never used  Substance and Sexual Activity   Alcohol use: No    Alcohol/week: 0.0 standard drinks of alcohol   Drug use: No   Sexual activity: Not Currently    Partners: Male     Birth control/protection: Surgical  Other Topics Concern   Not on file  Social History Narrative   Widowed in 2015   2 sons 1 lives in the area the other is in close contact    1 grandchild   Never smoker no drug use no alcohol   Social Determinants of Health   Financial Resource Strain: Low Risk  (01/11/2022)   Overall Financial Resource Strain (CARDIA)    Difficulty of Paying Living Expenses: Not hard at all  Food Insecurity: No Food Insecurity (01/11/2022)   Hunger Vital Sign    Worried About Running Out of Food in the Last Year: Never true    [place] in the Last Year: Never true  Transportation Needs: No Transportation Needs (01/11/2022)   PRAPARE - Hydrologist (Medical): No    Lack of Transportation (Non-Medical): No  Physical Activity: Inactive (01/11/2022)   Exercise Vital Sign    Days of Exercise per Week: 0 days    Minutes of Exercise per Session: 0 min  Stress: No Stress Concern Present (01/11/2022)   [place]    Feeling of Stress : Not at all  Social Connections: [place] (01/11/2022)   Social Connection and Isolation Panel [NHANES]    Frequency of Communication with Friends and Family: More than three times a week    Frequency of Social Gatherings with Friends and Family: More than three times a week    Attends Religious Services: More than 4 times per year    Active Member of [employer] or Organizations: Yes    Attends Music therapist: More than 4 times per year    Marital Status: Married    Tobacco Counseling Counseling given: Not Answered   Clinical Intake:  Pre-visit preparation completed: Yes  Pain : 0-10 Pain Score: 6  Pain Type: Chronic pain Pain Location: Back Pain Orientation: Right, Left, Lower Pain Descriptors / Indicators: Constant Pain Onset: More than  a month ago Pain Frequency: Constant     BMI - recorded:  25.97 Nutritional Status: BMI 25 -29 Overweight Nutritional Risks: None Diabetes: No  How often do you need to have someone help you when you read instructions, pamphlets, or other written materials from your doctor or pharmacy?: 1 - Never What is the last grade level you completed in school?: HSG  Diabetic? no  Interpreter Needed?: No  Information entered by :: Lisette Abu, LPN.   Activities of Daily Living    01/11/2022    2:30 PM 09/13/2021   12:23 AM  In your present state of health, do you have any difficulty performing the following activities:  Hearing? 0 0  Vision? 0 0  Difficulty concentrating or making decisions? 1 0  Walking or climbing stairs? 0 1  Dressing or bathing? 0 1  Doing errands, shopping? 0   Preparing Food and eating ? N   Using the Toilet? N   In the past six months, have you accidently leaked urine? N   Do you have problems with loss of bowel control? N   Managing your Medications? N   Managing your Finances? N   Housekeeping or managing your Housekeeping? Y     Patient Care Team: Binnie Rail, MD as PCP - General (Internal Medicine) Freada Bergeron, MD as PCP - Cardiology (Cardiology) Jalene Mullet, MD as Consulting Physician (Ophthalmology) Tanda Rockers, MD as Consulting Physician (Pulmonary Disease)  Indicate any recent Medical Services you may have received from other than [facility] providers in the past year (date may be approximate).     Assessment:   This is a routine wellness examination for [place].  Hearing/Vision screen Hearing Screening - Comments:: Denies hearing difficulties   Vision Screening - Comments:: Wears rx glasses - up to date with routine eye exams with Mellody Life, MD.   Dietary issues and exercise activities discussed: Current Exercise Habits: The patient does not participate in regular exercise at present, Exercise limited by: orthopedic condition(s);respiratory conditions(s);psychological  condition(s)   Goals Addressed             This Visit's Progress    Client understands the importance of follow-up with providers by attending scheduled visits        Depression Screen    01/11/2022    2:19 PM 12/17/2021   10:44 AM 11/26/2021    1:23 PM 08/13/2021   11:36 AM 07/20/2021    1:27 PM 06/22/2021    2:48 PM 05/25/2021    2:27 PM  PHQ 2/9 Scores  PHQ - 2 Score 6 0 '6 6 2 2 1  '$ PHQ- 9 Score 15  14        Fall Risk    01/11/2022    2:15 PM 12/17/2021   10:44 AM 11/26/2021    1:22 PM 08/13/2021   11:36 AM 07/20/2021    1:27 PM  Fall Risk   Falls in the past year? 1 0 1 0 0  Number falls in past yr: 0  0 0 0  Injury with Fall? 1  0  0  Risk for fall due to : Impaired balance/gait;Orthopedic patient;History of fall(s)        FALL RISK PREVENTION PERTAINING TO THE HOME:  Any stairs in or around the home? No  If so, are there any without handrails? No  Home free of loose throw rugs in walkways, pet beds, electrical cords, etc? Yes  Adequate lighting in your home to reduce  risk of falls? Yes   ASSISTIVE DEVICES UTILIZED TO PREVENT FALLS:  Life alert? No  Use of a cane, walker or w/c? Yes  Grab bars in the bathroom? Yes  Shower chair or bench in shower? Yes  Elevated toilet seat or a handicapped toilet? Yes   TIMED UP AND ML:YYTKP VISIT  Was the test performed? No .   Cognitive Function:    01/10/2017    3:39 PM  MMSE - Mini Mental State Exam  Orientation to time 5  Orientation to Place 5  Registration 3  Attention/ Calculation 5  Recall 2  Language- name 2 objects 2  Language- repeat 1  Language- follow 3 step command 3  Language- read & follow direction 1  Write a sentence 1  Copy design 1  Total score 29        01/11/2022    2:32 PM 09/11/2019    1:35 PM  6CIT Screen  What Year? 0 points 0 points  What month? 0 points 0 points  What time? 0 points 3 points  Count back from 20 0 points 0 points  Months in reverse 0 points 0 points  Repeat  phrase 0 points 0 points  Total Score 0 points 3 points    Immunizations Immunization History  Administered Date(s) Administered   Fluad Quad(high Dose 65+) 01/04/2019   Influenza Split 03/23/2011, 02/15/2012   Influenza Whole 02/12/2003, 12/24/2009   Influenza, High Dose Seasonal PF 01/06/2016, 01/10/2017, 03/16/2018   Influenza,inj,Quad PF,6+ Mos 01/29/2013, 01/03/2014, 02/11/2015   Influenza-Unspecified 04/09/2021   Moderna Covid-19 Vaccine Bivalent Booster 16yr & up 03/26/2021   Moderna SARS-COV2 Booster Vaccination 04/10/2020   Moderna Sars-Covid-2 Vaccination 06/21/2019, 07/24/2019   Pneumococcal Conjugate-13 05/06/2015   Pneumococcal Polysaccharide-23 07/06/2016   Tdap 01/09/2019    TDAP status: Up to date  Flu Vaccine status: Due, Education has been provided regarding the importance of this vaccine. Advised may receive this vaccine at local pharmacy or Health Dept. Aware to provide a copy of the vaccination record if obtained from local pharmacy or Health Dept. Verbalized acceptance and understanding.  Pneumococcal vaccine status: Up to date  Covid-19 vaccine status: Completed vaccines  Qualifies for Shingles Vaccine? Yes   Zostavax completed No   Shingrix Completed?: No.    Education has been provided regarding the importance of this vaccine. Patient has been advised to call insurance company to determine out of pocket expense if they have not yet received this vaccine. Advised may also receive vaccine at local pharmacy or Health Dept. Verbalized acceptance and understanding.  Screening Tests Health Maintenance  Topic Date Due   FOOT EXAM  Never done   OPHTHALMOLOGY EXAM  Never done   Zoster Vaccines- Shingrix (1 of 2) Never done   MAMMOGRAM  01/15/2021   COVID-19 Vaccine (4 - Moderna risk series) 05/21/2021   INFLUENZA VACCINE  11/23/2021   DEXA SCAN  01/15/2022   HEMOGLOBIN A1C  05/01/2022   Diabetic kidney evaluation - Urine ACR  10/30/2022   Diabetic kidney  evaluation - GFR measurement  11/04/2022   TETANUS/TDAP  01/08/2029   COLONOSCOPY (Pts 45-463yrInsurance coverage will need to be confirmed)  05/05/2031   Pneumonia Vaccine 6532Years old  Completed   Hepatitis C Screening  Completed   HPV VACCINES  Aged Out    Health Maintenance  Health Maintenance Due  Topic Date Due   FOOT EXAM  Never done   OPHTHALMOLOGY EXAM  Never done   Zoster Vaccines- Shingrix (  1 of 2) Never done   MAMMOGRAM  01/15/2021   COVID-19 Vaccine (4 - Moderna risk series) 05/21/2021   INFLUENZA VACCINE  11/23/2021    Colorectal cancer screening: Type of screening: Colonoscopy. Completed 05/04/2021. Repeat every 10 years  Mammogram status: Ordered 01/11/2022. Pt provided with contact info and advised to call to schedule appt.   Bone Density status: Ordered 01/11/2022. Pt provided with contact info and advised to call to schedule appt.  Lung Cancer Screening: (Low Dose CT Chest recommended if Age 25-80 years, 30 pack-year currently smoking OR have quit w/in 15years.) does not qualify.   Lung Cancer Screening Referral: NO  Additional Screening:  Hepatitis C Screening: does qualify; Completed 09/11/2021  Vision Screening: Recommended annual ophthalmology exams for early detection of glaucoma and other disorders of the eye. Is the patient up to date with their annual eye exam?  Yes  Who is the provider or what is the name of the office in which the patient attends annual eye exams? Mellody Life, MD. If pt is not established with a provider, would they like to be referred to a provider to establish care? No .   Dental Screening: Recommended annual dental exams for proper oral hygiene  Community Resource Referral / Chronic Care Management: CRR required this visit?  No   CCM required this visit?  No      Plan:     I have personally reviewed and noted the following in the patient's chart:   Medical and social history Use of alcohol, tobacco or illicit drugs   Current medications and supplements including opioid prescriptions. Patient is currently taking opioid prescriptions. Information provided to patient regarding non-opioid alternatives. Patient advised to discuss non-opioid treatment plan with their provider. Functional ability and status Nutritional status Physical activity Advanced directives List of other physicians Hospitalizations, surgeries, and ER visits in previous 12 months Vitals Screenings to include cognitive, depression, and falls Referrals and appointments  In addition, I have reviewed and discussed with patient certain preventive protocols, quality metrics, and best practice recommendations. A written personalized care plan for preventive services as well as general preventive health recommendations were provided to patient.     Sheral Flow, LPN   [id number]   Nurse Notes: N/A

## 2022-01-11 NOTE — Telephone Encounter (Signed)
AWV completed °

## 2022-01-11 NOTE — Patient Instructions (Signed)
[name] , Thank you for taking time to come for your Medicare Wellness Visit. I appreciate your ongoing commitment to your health goals. Please review the following plan we discussed and let me know if I can assist you in the future.   Screening recommendations/referrals: Colonoscopy: 05/04/2021; due every 10 years Mammogram: Order sent to [facility] Mammography Bone Density: Order sent to [facility] Mammography Recommended yearly ophthalmology/optometry visit for glaucoma screening and checkup Recommended yearly dental visit for hygiene and checkup  Vaccinations: Influenza vaccine: due Fall 2023 Pneumococcal vaccine: 05/06/2015, 07/06/2016 Tdap vaccine: 01/09/2019; due every 10 years Shingles vaccine: never done   Covid-19:06/21/2019, 3/31/202, 04/10/2020, 03/26/2021  Advanced directives: Yes; Please bring a copy of your health care power of attorney and living will to the office at your convenience.  Conditions/risks identified: Yes  Next appointment: Follow up in one year for your annual wellness visit.   Preventive Care 76 Years and Older, Female Preventive care refers to lifestyle choices and visits with your health care provider that can promote health and wellness. What does preventive care include? A yearly physical exam. This is also called an annual well check. Dental exams once or twice a year. Routine eye exams. Ask your health care provider how often you should have your eyes checked. Personal lifestyle choices, including: Daily care of your teeth and gums. Regular physical activity. Eating a healthy diet. Avoiding tobacco and drug use. Limiting alcohol use. Practicing safe sex. Taking low-dose aspirin every day. Taking vitamin and mineral supplements as recommended by your health care provider. What happens during an annual well check? The services and screenings done by your health care provider during your annual well check will depend on your age, overall health, lifestyle  risk factors, and family history of disease. Counseling  Your health care provider may ask you questions about your: Alcohol use. Tobacco use. Drug use. Emotional well-being. Home and relationship well-being. Sexual activity. Eating habits. History of falls. Memory and ability to understand (cognition). Work and work Statistician. Reproductive health. Screening  You may have the following tests or measurements: Height, weight, and BMI. Blood pressure. Lipid and cholesterol levels. These may be checked every 5 years, or more frequently if you are over 27 years old. Skin check. Lung cancer screening. You may have this screening every year starting at age 14 if you have a 30-pack-year history of smoking and currently smoke or have quit within the past 15 years. Fecal occult blood test (FOBT) of the stool. You may have this test every year starting at age 66. Flexible sigmoidoscopy or colonoscopy. You may have a sigmoidoscopy every 5 years or a colonoscopy every 10 years starting at age 67. Hepatitis C blood test. Hepatitis B blood test. Sexually transmitted disease (STD) testing. Diabetes screening. This is done by checking your blood sugar (glucose) after you have not eaten for a while (fasting). You may have this done every 1-3 years. Bone density scan. This is done to screen for osteoporosis. You may have this done starting at age 77. Mammogram. This may be done every 1-2 years. Talk to your health care provider about how often you should have regular mammograms. Talk with your health care provider about your test results, treatment options, and if necessary, the need for more tests. Vaccines  Your health care provider may recommend certain vaccines, such as: Influenza vaccine. This is recommended every year. Tetanus, diphtheria, and acellular pertussis (Tdap, Td) vaccine. You may need a Td booster every 10 years. Zoster vaccine.  You may need this after age 50. Pneumococcal  13-valent conjugate (PCV13) vaccine. One dose is recommended after age 55. Pneumococcal polysaccharide (PPSV23) vaccine. One dose is recommended after age 44. Talk to your health care provider about which screenings and vaccines you need and how often you need them. This information is not intended to replace advice given to you by your health care provider. Make sure you discuss any questions you have with your health care provider. Document Released: 05/08/2015 Document Revised: 12/30/2015 Document Reviewed: 02/10/2015 Elsevier Interactive Patient Education  2017 [employer] Prevention in the Home Falls can cause injuries. They can happen to people of all ages. There are many things you can do to make your home safe and to help prevent falls. What can I do on the outside of my home? Regularly fix the edges of walkways and driveways and fix any cracks. Remove anything that might make you trip as you walk through a door, such as a raised step or threshold. Trim any bushes or trees on the path to your home. Use bright outdoor lighting. Clear any walking paths of anything that might make someone trip, such as rocks or tools. Regularly check to see if handrails are loose or broken. Make sure that both sides of any steps have handrails. Any raised decks and porches should have guardrails on the edges. Have any leaves, snow, or ice cleared regularly. Use sand or salt on walking paths during winter. Clean up any spills in your garage right away. This includes oil or grease spills. What can I do in the bathroom? Use night lights. Install grab bars by the toilet and in the tub and shower. Do not use towel bars as grab bars. Use non-skid mats or decals in the tub or shower. If you need to sit down in the shower, use a plastic, non-slip stool. Keep the floor dry. Clean up any water that spills on the floor as soon as it happens. Remove soap buildup in the tub or shower regularly. Attach  bath mats securely with double-sided non-slip rug tape. Do not have throw rugs and other things on the floor that can make you trip. What can I do in the bedroom? Use night lights. Make sure that you have a light by your bed that is easy to reach. Do not use any sheets or blankets that are too big for your bed. They should not hang down onto the floor. Have a firm chair that has side arms. You can use this for support while you get dressed. Do not have throw rugs and other things on the floor that can make you trip. What can I do in the kitchen? Clean up any spills right away. Avoid walking on wet floors. Keep items that you use a lot in easy-to-reach places. If you need to reach something above you, use a strong step stool that has a grab bar. Keep electrical cords out of the way. Do not use floor polish or wax that makes floors slippery. If you must use wax, use non-skid floor wax. Do not have throw rugs and other things on the floor that can make you trip. What can I do with my stairs? Do not leave any items on the stairs. Make sure that there are handrails on both sides of the stairs and use them. Fix handrails that are broken or loose. Make sure that handrails are as long as the stairways. Check any carpeting to make sure that it is  firmly attached to the stairs. Fix any carpet that is loose or worn. Avoid having throw rugs at the top or bottom of the stairs. If you do have throw rugs, attach them to the floor with carpet tape. Make sure that you have a light switch at the top of the stairs and the bottom of the stairs. If you do not have them, ask someone to add them for you. What else can I do to help prevent falls? Wear shoes that: Do not have high heels. Have rubber bottoms. Are comfortable and fit you well. Are closed at the toe. Do not wear sandals. If you use a stepladder: Make sure that it is fully opened. Do not climb a closed stepladder. Make sure that both sides of the  stepladder are locked into place. Ask someone to hold it for you, if possible. Clearly mark and make sure that you can see: Any grab bars or handrails. First and last steps. Where the edge of each step is. Use tools that help you move around (mobility aids) if they are needed. These include: Canes. Walkers. Scooters. Crutches. Turn on the lights when you go into a dark area. Replace any light bulbs as soon as they burn out. Set up your furniture so you have a clear path. Avoid moving your furniture around. If any of your floors are uneven, fix them. If there are any pets around you, be aware of where they are. Review your medicines with your doctor. Some medicines can make you feel dizzy. This can increase your chance of falling. Ask your doctor what other things that you can do to help prevent falls. This information is not intended to replace advice given to you by your health care provider. Make sure you discuss any questions you have with your health care provider. Document Released: 02/05/2009 Document Revised: 09/17/2015 Document Reviewed: 05/16/2014 Elsevier Interactive Patient Education  2017 [employer].

## 2022-01-12 ENCOUNTER — Other Ambulatory Visit: Payer: Self-pay | Admitting: Internal Medicine

## 2022-01-14 ENCOUNTER — Other Ambulatory Visit: Payer: Self-pay | Admitting: Internal Medicine

## 2022-01-19 ENCOUNTER — Encounter: Payer: Self-pay | Admitting: Internal Medicine

## 2022-01-19 DIAGNOSIS — I5032 Chronic diastolic (congestive) heart failure: Secondary | ICD-10-CM | POA: Insufficient documentation

## 2022-01-19 DIAGNOSIS — E60 Dietary zinc deficiency: Secondary | ICD-10-CM | POA: Insufficient documentation

## 2022-01-19 DIAGNOSIS — I272 Pulmonary hypertension, unspecified: Secondary | ICD-10-CM | POA: Insufficient documentation

## 2022-01-19 NOTE — Patient Instructions (Addendum)
     Blood work was ordered.     Medications changes include :   ubrelvy for migraines.     Your prescription(s) have been sent to your pharmacy.      Return in about 6 months (around 07/21/2022) for follow up,  cancle 10/3 appt.

## 2022-01-19 NOTE — Progress Notes (Unsigned)
Subjective:    Patient ID: Hannah Scott, female    DOB: 09-23-1950, 71 y.o.   MRN: [id number]     HPI Aleen is here for follow up of her chronic medical problems, including anemia, chronic fatigue, hypertension, GERD, diabetes, hypothyroidism, depression  I saw her 2 months ago and she was still recovering from her hospitalization in May (STEMI, sepsis with acute liver failure, acute cystitis, AKI, dehydration, LLE cellulitis).  Rehab and was sent back to the hospital for flash pulmonary edema, briefly intubated.  On follow-up was started on torsemide, iron, zinc.  Still very fatigued, appetite was low and was not eating well.  At her last visit I decreased her irbesartan.  She feels better - still very fatigued.    Last night had severe cramping in right lower leg - occurs once in a while - can be either leg.    Arthritic pain - shoulders.    Decreased appetite.  Sandwich for lunch.  Breakfast - pop tart, cereal bar - occ egg.  At night - chicken or baked potato.  Soup  Using oxygen  as needed. Saw pulm - Nov plan for CXR and walking test - he believes she will be able to be off the oxygen.   Medications and allergies reviewed with patient and updated if appropriate.  Current Outpatient Medications on File Prior to Visit  Medication Sig Dispense Refill   albuterol (VENTOLIN HFA) 108 (90 Base) MCG/ACT inhaler Inhale 2 puffs into the lungs every 4 (four) hours as needed for wheezing or shortness of breath. 1 each 11   aspirin EC 81 MG tablet Take 1 tablet (81 mg total) by mouth daily. Swallow whole. 90 tablet 3   atorvastatin (LIPITOR) 20 MG tablet Take 1 tablet (20 mg total) by mouth daily. 90 tablet 3   bisoprolol (ZEBETA) 5 MG tablet Take 5 mg by mouth daily.     BREO ELLIPTA 100-25 MCG/ACT AEPB INHALE 1 PUFF INTO THE LUNGS DAILY 60 each 11   DULoxetine (CYMBALTA) 30 MG capsule TAKE ONE CAPSULE EVERY DAY IN ADDITION TO '60MG'$  CAPSULE FOR A TOTAL OF '90MG'$  DAILY 90 capsule 1    DULoxetine (CYMBALTA) 60 MG capsule TAKE ONE CAPSULE BY MOUTH DAILY. TAKE ALONG WITH '30MG'$  CAPSULE. TOTAL DAILY DOSE = 90 MG. 30 capsule 1   famotidine (PEPCID) 20 MG tablet One after supper 90 tablet 2   Ferric Maltol (ACCRUFER) 30 MG CAPS Take 1 capsule by mouth in the morning and at bedtime. (Patient taking differently: Take 1 capsule by mouth daily.) 180 capsule 1   gabapentin (NEURONTIN) 600 MG tablet TAKE ONE TABLET BY MOUTH EVERY DAY AT BEDTIME 90 tablet 1   HYDROcodone-acetaminophen (NORCO) 10-325 MG tablet Take 1 tablet by mouth 3 (three) times daily as needed. 90 tablet 0   ipratropium-albuterol (DUONEB) 0.5-2.5 (3) MG/3ML SOLN Take 3 mLs by nebulization every 4 (four) hours as needed (wheezing or SOB). During exacerbation 360 mL 1   irbesartan (AVAPRO) 75 MG tablet Take 1 tablet (75 mg total) by mouth daily. 90 tablet 1   levothyroxine (SYNTHROID) 100 MCG tablet Take 1 tablet (100 mcg total) by mouth daily. 90 tablet 3   torsemide (DEMADEX) 10 MG tablet Take 1 tablet (10 mg total) by mouth daily. 90 tablet 0   Vitamin D, Ergocalciferol, (DRISDOL) 1.25 MG (50000 UNIT) CAPS capsule TAKE ONE CAPSULE BY MOUTH ONCE A WEEK (Patient taking differently: Take 50,000 Units by mouth every 7 (  seven) days.) 12 capsule 0   zinc gluconate 50 MG tablet Take 1 tablet (50 mg total) by mouth daily. 90 tablet 1   No current facility-administered medications on file prior to visit.     Review of Systems  Constitutional:  Positive for appetite change. Negative for chills and fever.  Respiratory:  Positive for shortness of breath. Negative for cough and wheezing.   Cardiovascular:  Positive for palpitations (occ) and leg swelling (controlled). Negative for chest pain.  Neurological:  Positive for light-headedness (occ if BP low) and headaches (occ migraine).       Objective:   Vitals:   01/20/22 0848  BP: 124/82  Pulse: (!) 115  Temp: 98.2 F (36.8 C)  SpO2: 95%   BP Readings from Last 3  Encounters:  01/20/22 124/82  12/17/21 114/72  12/17/21 (!) 102/56   Wt Readings from Last 3 Encounters:  01/20/22 137 lb (62.1 kg)  01/11/22 133 lb (60.3 kg)  12/17/21 138 lb (62.6 kg)   Body mass index is 26.76 kg/m.    Physical Exam Constitutional:      General: She is not in acute distress.    Appearance: Normal appearance.  HENT:     Head: Normocephalic and atraumatic.  Eyes:     Conjunctiva/sclera: Conjunctivae normal.  Cardiovascular:     Rate and Rhythm: Normal rate and regular rhythm.     Heart sounds: Normal heart sounds. No murmur heard. Pulmonary:     Effort: Pulmonary effort is normal. No respiratory distress.     Breath sounds: Normal breath sounds. No wheezing.  Musculoskeletal:        General: Deformity (severe kyphosis) present.     Cervical back: Neck supple.     Right lower leg: Edema (trace) present.     Left lower leg: Edema (trace) present.  Lymphadenopathy:     Cervical: No cervical adenopathy.  Skin:    General: Skin is warm and dry.     Findings: No rash.  Neurological:     Mental Status: She is alert. Mental status is at baseline.  Psychiatric:        Mood and Affect: Mood normal.        Behavior: Behavior normal.        Lab Results  Component Value Date   WBC 5.0 10/29/2021   HGB 10.4 (L) 10/29/2021   HCT 32.7 (L) 10/29/2021   PLT 255.0 10/29/2021   GLUCOSE 79 11/03/2021   CHOL 120 07/19/2021   TRIG 81.0 07/19/2021   HDL 53.90 07/19/2021   LDLDIRECT 154.8 02/20/2009   LDLCALC 50 07/19/2021   ALT 13 11/03/2021   AST 23 11/03/2021   NA 141 11/03/2021   K 4.2 11/03/2021   CL 99 11/03/2021   CREATININE 0.64 11/03/2021   BUN 18 11/03/2021   CO2 28 11/03/2021   TSH 2.58 10/29/2021   INR 1.2 09/18/2021   HGBA1C 5.0 10/29/2021   MICROALBUR 1.6 10/29/2021     Assessment & Plan:    See Problem List for Assessment and Plan of chronic medical problems.

## 2022-01-19 NOTE — Assessment & Plan Note (Signed)
Was on iron and zinc deficient-has been taking supplementation for the past 2 months CBC, iron levels, zinc level Check B12 level

## 2022-01-20 ENCOUNTER — Encounter: Payer: Self-pay | Admitting: Registered Nurse

## 2022-01-20 ENCOUNTER — Encounter: Payer: Self-pay | Admitting: Internal Medicine

## 2022-01-20 ENCOUNTER — Encounter: Payer: Medicare Other | Attending: Registered Nurse | Admitting: Registered Nurse

## 2022-01-20 ENCOUNTER — Ambulatory Visit (INDEPENDENT_AMBULATORY_CARE_PROVIDER_SITE_OTHER): Payer: Medicare Other | Admitting: Internal Medicine

## 2022-01-20 VITALS — BP 124/82 | HR 115 | Temp 98.2°F | Wt 137.0 lb

## 2022-01-20 VITALS — BP 124/81 | HR 100 | Ht 60.0 in | Wt 136.6 lb

## 2022-01-20 DIAGNOSIS — K219 Gastro-esophageal reflux disease without esophagitis: Secondary | ICD-10-CM

## 2022-01-20 DIAGNOSIS — Z5181 Encounter for therapeutic drug level monitoring: Secondary | ICD-10-CM | POA: Diagnosis not present

## 2022-01-20 DIAGNOSIS — M24551 Contracture, right hip: Secondary | ICD-10-CM

## 2022-01-20 DIAGNOSIS — Z79891 Long term (current) use of opiate analgesic: Secondary | ICD-10-CM | POA: Diagnosis not present

## 2022-01-20 DIAGNOSIS — E60 Dietary zinc deficiency: Secondary | ICD-10-CM | POA: Diagnosis not present

## 2022-01-20 DIAGNOSIS — M25512 Pain in left shoulder: Secondary | ICD-10-CM | POA: Insufficient documentation

## 2022-01-20 DIAGNOSIS — E039 Hypothyroidism, unspecified: Secondary | ICD-10-CM

## 2022-01-20 DIAGNOSIS — G8929 Other chronic pain: Secondary | ICD-10-CM

## 2022-01-20 DIAGNOSIS — E559 Vitamin D deficiency, unspecified: Secondary | ICD-10-CM | POA: Diagnosis not present

## 2022-01-20 DIAGNOSIS — F3289 Other specified depressive episodes: Secondary | ICD-10-CM

## 2022-01-20 DIAGNOSIS — G894 Chronic pain syndrome: Secondary | ICD-10-CM | POA: Insufficient documentation

## 2022-01-20 DIAGNOSIS — Z23 Encounter for immunization: Secondary | ICD-10-CM | POA: Diagnosis not present

## 2022-01-20 DIAGNOSIS — N1832 Chronic kidney disease, stage 3b: Secondary | ICD-10-CM

## 2022-01-20 DIAGNOSIS — D539 Nutritional anemia, unspecified: Secondary | ICD-10-CM | POA: Diagnosis not present

## 2022-01-20 DIAGNOSIS — I272 Pulmonary hypertension, unspecified: Secondary | ICD-10-CM

## 2022-01-20 DIAGNOSIS — M48062 Spinal stenosis, lumbar region with neurogenic claudication: Secondary | ICD-10-CM

## 2022-01-20 DIAGNOSIS — E7849 Other hyperlipidemia: Secondary | ICD-10-CM

## 2022-01-20 DIAGNOSIS — I10 Essential (primary) hypertension: Secondary | ICD-10-CM | POA: Diagnosis not present

## 2022-01-20 DIAGNOSIS — J449 Chronic obstructive pulmonary disease, unspecified: Secondary | ICD-10-CM | POA: Diagnosis not present

## 2022-01-20 DIAGNOSIS — I5020 Unspecified systolic (congestive) heart failure: Secondary | ICD-10-CM | POA: Diagnosis not present

## 2022-01-20 DIAGNOSIS — M25511 Pain in right shoulder: Secondary | ICD-10-CM | POA: Diagnosis not present

## 2022-01-20 DIAGNOSIS — I5032 Chronic diastolic (congestive) heart failure: Secondary | ICD-10-CM

## 2022-01-20 DIAGNOSIS — E119 Type 2 diabetes mellitus without complications: Secondary | ICD-10-CM | POA: Diagnosis not present

## 2022-01-20 DIAGNOSIS — G43909 Migraine, unspecified, not intractable, without status migrainosus: Secondary | ICD-10-CM

## 2022-01-20 DIAGNOSIS — M961 Postlaminectomy syndrome, not elsewhere classified: Secondary | ICD-10-CM | POA: Diagnosis not present

## 2022-01-20 LAB — COMPREHENSIVE METABOLIC PANEL
ALT: 18 U/L (ref 0–35)
AST: 25 U/L (ref 0–37)
Albumin: 3.7 g/dL (ref 3.5–5.2)
Alkaline Phosphatase: 115 U/L (ref 39–117)
BUN: 17 mg/dL (ref 6–23)
CO2: 35 mEq/L — ABNORMAL HIGH (ref 19–32)
Calcium: 9.4 mg/dL (ref 8.4–10.5)
Chloride: 98 mEq/L (ref 96–112)
Creatinine, Ser: 0.77 mg/dL (ref 0.40–1.20)
GFR: 77.04 mL/min (ref >60.00)
Glucose, Bld: 83 mg/dL (ref 70–99)
Potassium: 3.5 mEq/L (ref 3.5–5.1)
Sodium: 141 mEq/L (ref 135–145)
Total Bilirubin: 0.8 mg/dL (ref 0.2–1.2)
Total Protein: 6.3 g/dL (ref 6.0–8.3)

## 2022-01-20 LAB — LIPID PANEL
Cholesterol: 152 mg/dL (ref 0–200)
HDL: 51 mg/dL (ref >39.00)
LDL Cholesterol: 85 mg/dL (ref 0–99)
NonHDL: 100.75
Total CHOL/HDL Ratio: 3
Triglycerides: 79 mg/dL (ref 0.0–149.0)
VLDL: 15.8 mg/dL (ref 0.0–40.0)

## 2022-01-20 LAB — CBC WITH DIFFERENTIAL/PLATELET
Basophils Absolute: 0 10*3/uL (ref 0.0–0.1)
Basophils Relative: 0.9 % (ref 0.0–3.0)
Eosinophils Absolute: 0.5 10*3/uL (ref 0.0–0.7)
Eosinophils Relative: 10.7 % — ABNORMAL HIGH (ref 0.0–5.0)
HCT: 39.2 % (ref 36.0–46.0)
Hemoglobin: 12.4 g/dL (ref 12.0–15.0)
Lymphocytes Relative: 20.5 % (ref 12.0–46.0)
Lymphs Abs: 1 10*3/uL (ref 0.7–4.0)
MCHC: 31.6 g/dL (ref 30.0–36.0)
MCV: 83.8 fl (ref 78.0–100.0)
Monocytes Absolute: 0.5 10*3/uL (ref 0.1–1.0)
Monocytes Relative: 9.9 % (ref 3.0–12.0)
Neutro Abs: 2.8 10*3/uL (ref 1.4–7.7)
Neutrophils Relative %: 58 % (ref 43.0–77.0)
Platelets: 190 10*3/uL (ref 150.0–400.0)
RBC: 4.67 Mil/uL (ref 3.87–5.11)
RDW: 21.2 % — ABNORMAL HIGH (ref 11.5–15.5)
WBC: 4.9 10*3/uL (ref 4.0–10.5)

## 2022-01-20 LAB — VITAMIN D 25 HYDROXY (VIT D DEFICIENCY, FRACTURES): VITD: 54.5 ng/mL (ref 30.00–100.00)

## 2022-01-20 LAB — IBC PANEL
Iron: 52 ug/dL (ref 42–145)
Saturation Ratios: 13.6 % — ABNORMAL LOW (ref 20.0–50.0)
TIBC: 382.2 ug/dL (ref 250.0–450.0)
Transferrin: 273 mg/dL (ref 212.0–360.0)

## 2022-01-20 LAB — FERRITIN: Ferritin: 31.3 ng/mL (ref 10.0–291.0)

## 2022-01-20 LAB — VITAMIN B12: Vitamin B-12: 168 pg/mL — ABNORMAL LOW (ref 211–911)

## 2022-01-20 LAB — TSH: TSH: 0.94 u[IU]/mL (ref 0.35–5.50)

## 2022-01-20 LAB — HEMOGLOBIN A1C: Hgb A1c MFr Bld: 6 % (ref 4.6–6.5)

## 2022-01-20 MED ORDER — UBRELVY 50 MG PO TABS: 50.0000 mg | ORAL_TABLET | ORAL | 5 refills | Status: DC | PRN

## 2022-01-20 MED ORDER — CLOBETASOL PROPIONATE 0.05 % EX CREA: 1.0000 | TOPICAL_CREAM | Freq: Two times a day (BID) | CUTANEOUS | 2 refills | Status: DC

## 2022-01-20 NOTE — Assessment & Plan Note (Signed)
Chronic GERD controlled Continue famotidine 20 mg daily 

## 2022-01-20 NOTE — Assessment & Plan Note (Signed)
Chronic Following with cardiology Appears to be euvolemic Continue torsemide 10 mg daily, bisoprolol 5 mg daily

## 2022-01-20 NOTE — Assessment & Plan Note (Signed)
Chronic Taking vitamin D daily Check vitamin D level  

## 2022-01-20 NOTE — Assessment & Plan Note (Signed)
Chronic Following with pulmonary

## 2022-01-20 NOTE — Assessment & Plan Note (Signed)
Chronic  Lab Results  Component Value Date   HGBA1C 5.0 10/29/2021   Sugars hit the diabetic range once and have been in the normal range since Check A1c Continue lifestyle control

## 2022-01-20 NOTE — Assessment & Plan Note (Addendum)
Chronic Not ideally controlled Advised trying to be more active during the day and concentrate on the things she can do not the things she can not do Continue Cymbalta 90 mg daily If depression is not improving will consider increasing to 120 mg daily

## 2022-01-20 NOTE — Assessment & Plan Note (Signed)
Chronic Blood pressure well controlled CMP Continue bisoprolol 5 mg daily, torsemide 10 mg daily, irbesartan 75 mg daily

## 2022-01-20 NOTE — Assessment & Plan Note (Signed)
Chronic Taking zinc supplement Check zinc level

## 2022-01-20 NOTE — Assessment & Plan Note (Signed)
Chronic  Clinically euthyroid Check tsh and will titrate med dose if needed Currently taking levothyroxine 100 mcg daily   

## 2022-01-20 NOTE — Assessment & Plan Note (Signed)
Chronic Continue atorvastatin 20 mg daily 

## 2022-01-20 NOTE — Progress Notes (Signed)
Subjective:    Patient ID: Hannah Scott, female    DOB: 01/16/1951, 71 y.o.   MRN: [id number]  HPI: Hannah Scott is a 71 y.o. female who returns for follow up appointment for chronic pain and medication refill. She states her pain is located in her bilateral shoulders and lower back pain. She rates her pain 7. Her current exercise regime is walking with her walker..  Ms. Kiester Morphine equivalent is 30.00 MME.   Last Oral Swab was Performed on 12/17/2021, it was consistent.   Son in the room   Pain Inventory Average Pain 7 Pain Right Now 7 My pain is constant, burning, tingling, and aching  In the last 24 hours, has pain interfered with the following? General activity 10 Relation with others 10 Enjoyment of life 10 What TIME of day is your pain at its worst? morning , daytime, evening, and night Sleep (in general) Poor  Pain is worse with: walking and standing Pain improves with: rest and medication Relief from Meds: 6  Family History  Problem Relation Age of Onset   Heart failure Mother    Pneumonia Mother    Hypertension Mother    Heart disease Mother    Stroke Maternal Grandfather    Hypertension Maternal Grandfather    Heart attack Father    Heart disease Father    Asthma Paternal Uncle        PAT UNCLES   Heart attack Paternal Uncle    Social History   Socioeconomic History   Marital status: Widowed    Spouse name: Not on file   Number of children: Not on file   Years of education: Not on file   Highest education level: Not on file  Occupational History   Not on file  Tobacco Use   Smoking status: Never    Passive exposure: Never   Smokeless tobacco: Never  Vaping Use   Vaping Use: Never used  Substance and Sexual Activity   Alcohol use: No    Alcohol/week: 0.0 standard drinks of alcohol   Drug use: No   Sexual activity: Not Currently    Partners: Male    Birth control/protection: Surgical  Other Topics Concern   Not on file  Social History  Narrative   Widowed in 2015   2 sons 1 lives in the area the other is in close contact    1 grandchild   Never smoker no drug use no alcohol   Social Determinants of Health   Financial Resource Strain: Low Risk  (01/11/2022)   Overall Financial Resource Strain (CARDIA)    Difficulty of Paying Living Expenses: Not hard at all  Food Insecurity: No Food Insecurity (01/11/2022)   Hunger Vital Sign    Worried About Running Out of Food in the Last Year: Never true    [place] in the Last Year: Never true  Transportation Needs: No Transportation Needs (01/11/2022)   PRAPARE - Hydrologist (Medical): No    Lack of Transportation (Non-Medical): No  Physical Activity: Inactive (01/11/2022)   Exercise Vital Sign    Days of Exercise per Week: 0 days    Minutes of Exercise per Session: 0 min  Stress: No Stress Concern Present (01/11/2022)   [place]    Feeling of Stress : Not at all  Social Connections: [place] (01/11/2022)   Social Connection and Isolation Panel [NHANES]  Frequency of Communication with Friends and Family: More than three times a week    Frequency of Social Gatherings with Friends and Family: More than three times a week    Attends Religious Services: More than 4 times per year    Active Member of Clubs or Organizations: Yes    Attends Music therapist: More than 4 times per year    Marital Status: Married   Past Surgical History:  Procedure Laterality Date   ANKLE SURGERY Left    ligament   APPENDECTOMY  1987   AT [place] LUMPECTOMY  2003   radiation on right   BREAST LUMPECTOMY WITH RADIOACTIVE SEED LOCALIZATION Left 02/12/2016   Procedure: LEFT BREAST LUMPECTOMY WITH RADIOACTIVE SEED LOCALIZATION;  Surgeon: Excell Seltzer, MD;  Location: [place];  Service: General;  Laterality: Left;    CARDIOVASCULAR STRESS TEST  09/07/05   Nuclear, was negative   CATARACT EXTRACTION Bilateral 01,03   COLONOSCOPY WITH PROPOFOL N/A 05/04/2021   Procedure: COLONOSCOPY WITH PROPOFOL;  Surgeon: Gatha Mayer, MD;  [facility];  Service: Endoscopy;  Laterality: N/A;   ESOPHAGOGASTRODUODENOSCOPY (EGD) WITH PROPOFOL N/A 05/04/2021   Procedure: ESOPHAGOGASTRODUODENOSCOPY (EGD) WITH PROPOFOL;  Surgeon: Gatha Mayer, MD;  [facility];  Service: Endoscopy;  Laterality: N/A;   OOPHORECTOMY     LSO -RSO   PELVIC LAPAROSCOPY  1989   RSO, LYSIS OF ADHESIONS   POLYPECTOMY  05/04/2021   Procedure: POLYPECTOMY;  Surgeon: Gatha Mayer, MD;  [facility];  Service: Endoscopy;;   TOTAL ABDOMINAL HYSTERECTOMY  1987   LSO, APPENDECTOMY   TOTAL HIP ARTHROPLASTY Right 10/28/2013   dr Lorin Mercy   TOTAL HIP ARTHROPLASTY Right 10/28/2013   Procedure: TOTAL HIP ARTHROPLASTY ANTERIOR APPROACH;  Surgeon: Marybelle Killings, MD;  Location: [place] [facility];  Service: Orthopedics;  Laterality: Right;  Right Total Hip Arthroplasty, Direct Anterior Approach   Past Surgical History:  Procedure Laterality Date   ANKLE SURGERY Left    ligament   APPENDECTOMY  1987   AT [facility]   BACK SURGERY     Fusion   BREAST LUMPECTOMY  2003   radiation on right   BREAST LUMPECTOMY WITH RADIOACTIVE SEED LOCALIZATION Left 02/12/2016   Procedure: LEFT BREAST LUMPECTOMY WITH RADIOACTIVE SEED LOCALIZATION;  Surgeon: Excell Seltzer, MD;  Location: [place];  Service: General;  Laterality: Left;   CARDIOVASCULAR STRESS TEST  09/07/05   Nuclear, was negative   CATARACT EXTRACTION Bilateral 01,03   COLONOSCOPY WITH PROPOFOL N/A 05/04/2021   Procedure: COLONOSCOPY WITH PROPOFOL;  Surgeon: Gatha Mayer, MD;  [facility];  Service: Endoscopy;  Laterality: N/A;   ESOPHAGOGASTRODUODENOSCOPY (EGD) WITH PROPOFOL N/A 05/04/2021   Procedure: ESOPHAGOGASTRODUODENOSCOPY (EGD) WITH PROPOFOL;  Surgeon:  Gatha Mayer, MD;  [facility];  Service: Endoscopy;  Laterality: N/A;   OOPHORECTOMY     LSO -RSO   PELVIC LAPAROSCOPY  1989   RSO, LYSIS OF ADHESIONS   POLYPECTOMY  05/04/2021   Procedure: POLYPECTOMY;  Surgeon: Gatha Mayer, MD;  [facility];  Service: Endoscopy;;   TOTAL ABDOMINAL HYSTERECTOMY  1987   LSO, APPENDECTOMY   TOTAL HIP ARTHROPLASTY Right 10/28/2013   dr Lorin Mercy   TOTAL HIP ARTHROPLASTY Right 10/28/2013   Procedure: TOTAL HIP ARTHROPLASTY ANTERIOR APPROACH;  Surgeon: Marybelle Killings, MD;  Location: [place];  Service: Orthopedics;  Laterality: Right;  Right  Total Hip Arthroplasty, Direct Anterior Approach   Past Medical History:  Diagnosis Date   Anemia    hx   Arthritis    Asthma    PFTs, February, 2011, moderate obstructive disease with response to bronchodilators, normal lung volumes, moderate reduction in diffusing capacity   Atrial septal aneurysm    Echo, 2008-not noted on 13 echo   CAD (coronary artery disease)    90% distal LAD in the past  /   nuclear, 2008, no ischemia, ejection fraction 70%   Cancer ([place]) 2002   DUCTAL CIS--S/P LUMPECTOMY, RADIATION AND 6 WEEKS OF TAMOXIFEN   D-dimer, elevated    January, 2014   Depression    Ejection fraction    EF 60%, echo, October, 2008   Elevated CPK    January, 2014   Endometriosis 1989   RIGHT TUBE   Endometriosis 1987   LEFT TUBE/OVARY W FOCAL IN-SITU ENDOMETRIAL ADENOCARCINOMA   GERD (gastroesophageal reflux disease)    occ   H/O hiatal hernia    ?   Hyperlipidemia    Hypertension    Hypothyroidism    Patient has had in the past that she does not need treatment   Kyphoscoliosis    Obstructive airway disease (HCC)    Pinched nerve    lower back   Shortness of breath    Echo 2/22: EF 60-65, no RWMA, mild LVH, GR 1  DD, GLS -24%, normal RVSF, mild MR, trivial AI, borderline dilation of aortic root (39 mm)   UTI (lower urinary tract infection)    BP 124/81   Pulse (!) 111   Ht 5'  (1.524 m)   Wt 136 lb 9.6 oz (62 kg)   SpO2 95%   BMI 26.68 kg/m   Opioid Risk Score:   Fall Risk Score:  `1  Depression screen [facility] 2/9     01/20/2022   10:27 AM 01/11/2022    2:19 PM 12/17/2021   10:44 AM 11/26/2021    1:23 PM 08/13/2021   11:36 AM 07/20/2021    1:27 PM 06/22/2021    2:48 PM  Depression screen PHQ 2/9  Decreased Interest 0 3 0 '3 3 1 1  '$ Down, Depressed, Hopeless 0 3 0 '3 3 1 1  '$ PHQ - 2 Score 0 6 0 '6 6 2 2  '$ Altered sleeping  3  2     Tired, decreased energy  3  3     Change in appetite  3  3     Feeling bad or failure about yourself   0  0     Trouble concentrating  0  0     Moving slowly or fidgety/restless  0  0     Suicidal thoughts  0  0     PHQ-9 Score  15  14     Difficult doing work/chores    Somewhat difficult         Review of Systems  Musculoskeletal:  Positive for back pain.       Bilateral knee pain  All other systems reviewed and are negative.     Objective:   Physical Exam Vitals and nursing note reviewed.  Constitutional:      Appearance: Normal appearance.  Cardiovascular:     Rate and Rhythm: Normal rate and regular rhythm.     Pulses: Normal pulses.     Heart sounds: Normal heart sounds.  Pulmonary:     Effort: Pulmonary effort is normal.  Breath sounds: Normal breath sounds.  Musculoskeletal:     Cervical back: Normal range of motion and neck supple.     Comments: Normal Muscle Bulk and Muscle Testing Reveals:  Upper Extremities: Decreased ROM 45 Degrees and Muscle Strength 5/5 Bilateral AC Joint Tenderness Lumbar Paraspinal Tenderness: L-3-L-5 Lower Extremities: Full ROM and Muscle Strength 5/5 Arises from Table slowly using walker for support Antalgic Gait     Skin:    General: Skin is warm and dry.  Neurological:     Mental Status: She is alert and oriented to person, place, and time.  Psychiatric:        Mood and Affect: Mood normal.        Behavior: Behavior normal.         Assessment & Plan:  1. Lumbar  post laminectomy syndrome with severe kyphoscoliosis thoracolumbar spine, s/p lumbar fusion/  01/20/2022 Continue current medication regimen. Continue  Hydrocodone 10/'325mg'$  one tablet every 8 hours may take an extra tablet when pain is severe #100. We will continue the opioid monitoring program, this consists of regular clinic visits, examinations, urine drug screen, pill counts as well as use of [place] Controlled Substance Reporting system. A 12 month History has been reviewed on the [place] Controlled Substance Reporting System on 01/20/2022 2. Right Hip OA: S/P Right Hip Replacement 10/28/2013. 01/20/2022 3.Depression: Continue Cymbalta. Has Family Support and Friends.  Counseling with Doristine Bosworth. 01/20/2022. 4. Right Greater Trochanteric Tenderness: No complaints today. Continue to Monitor. Continue with Ice and Heat Therapy. 01/20/2022 5. Poor Appetite/ Frail> Ms. Duffett states PCP following. Continue to monitor. 01/20/2022 6. Chronic Bilateral Knee Pain: No complaints today .Continue HEP as Tolerated. Continue to Monitor. 01/20/2022 7. Chronic Bilateral shoulder pain: Continue HEP as tolerated. Continue to Monitor.   F/U in 1 month

## 2022-01-20 NOTE — Assessment & Plan Note (Signed)
Chronic CMP 

## 2022-01-22 ENCOUNTER — Encounter: Payer: Self-pay | Admitting: Internal Medicine

## 2022-01-22 DIAGNOSIS — I5020 Unspecified systolic (congestive) heart failure: Secondary | ICD-10-CM | POA: Diagnosis not present

## 2022-01-22 DIAGNOSIS — J449 Chronic obstructive pulmonary disease, unspecified: Secondary | ICD-10-CM | POA: Diagnosis not present

## 2022-01-22 DIAGNOSIS — E538 Deficiency of other specified B group vitamins: Secondary | ICD-10-CM | POA: Insufficient documentation

## 2022-01-22 LAB — ZINC: Zinc: 72 ug/dL (ref 60–130)

## 2022-01-25 ENCOUNTER — Ambulatory Visit: Payer: Medicare Other | Admitting: Internal Medicine

## 2022-01-29 DIAGNOSIS — I5020 Unspecified systolic (congestive) heart failure: Secondary | ICD-10-CM | POA: Diagnosis not present

## 2022-01-29 DIAGNOSIS — J449 Chronic obstructive pulmonary disease, unspecified: Secondary | ICD-10-CM | POA: Diagnosis not present

## 2022-02-04 ENCOUNTER — Telehealth: Payer: Self-pay

## 2022-02-04 ENCOUNTER — Other Ambulatory Visit: Payer: Self-pay | Admitting: Internal Medicine

## 2022-02-04 NOTE — Telephone Encounter (Signed)
Hannah Scott (Key: [id number])  Your information has been submitted to [place]. [employer] [place] will review the request and notify you of the determination decision directly, typically within 3 business days of your submission and once all necessary information is received.  You will also receive your request decision electronically. To check for an update later, open the request again from your dashboard.  If [employer] [place] has not responded within the specified timeframe or if you have any questions about your PA submission, contact [employer] directly at [facility]) [phone] or ([place]) [phone].

## 2022-02-07 MED ORDER — VITAMIN D (ERGOCALCIFEROL) 1.25 MG (50000 UNIT) PO CAPS: 50000.0000 [IU] | ORAL_CAPSULE | ORAL | 0 refills | Status: DC

## 2022-02-07 MED ORDER — BISOPROLOL FUMARATE 5 MG PO TABS: 5.0000 mg | ORAL_TABLET | Freq: Every day | ORAL | 1 refills | Status: DC

## 2022-02-08 ENCOUNTER — Encounter: Payer: Self-pay | Admitting: Registered Nurse

## 2022-02-08 ENCOUNTER — Encounter: Payer: Medicare Other | Attending: Registered Nurse | Admitting: Registered Nurse

## 2022-02-08 VITALS — BP 112/76 | HR 99 | Ht 60.0 in

## 2022-02-08 DIAGNOSIS — M24551 Contracture, right hip: Secondary | ICD-10-CM | POA: Insufficient documentation

## 2022-02-08 DIAGNOSIS — G894 Chronic pain syndrome: Secondary | ICD-10-CM | POA: Insufficient documentation

## 2022-02-08 DIAGNOSIS — M961 Postlaminectomy syndrome, not elsewhere classified: Secondary | ICD-10-CM | POA: Diagnosis not present

## 2022-02-08 DIAGNOSIS — M48062 Spinal stenosis, lumbar region with neurogenic claudication: Secondary | ICD-10-CM | POA: Diagnosis not present

## 2022-02-08 DIAGNOSIS — Z5181 Encounter for therapeutic drug level monitoring: Secondary | ICD-10-CM | POA: Diagnosis not present

## 2022-02-08 DIAGNOSIS — Z79891 Long term (current) use of opiate analgesic: Secondary | ICD-10-CM | POA: Insufficient documentation

## 2022-02-08 MED ORDER — HYDROCODONE-ACETAMINOPHEN 10-325 MG PO TABS: 1.0000 | ORAL_TABLET | Freq: Three times a day (TID) | ORAL | 0 refills | Status: DC | PRN

## 2022-02-08 NOTE — Progress Notes (Signed)
Subjective:    Patient ID: Hannah Scott, female    DOB: Oct 20, 1950, 71 y.o.   MRN: [id number]  HPI: Hannah Scott is a 71 y.o. female who is scheduled for a Telephone Visit, we have  discussed the limitations of evaluation and management by telemedicine and the availability of in person appointments. The patient expressed understanding and agreed to proceed. She states her  pain is located in her lower back. She rates her pain 7. Her current exercise regime is walking with her walker.   Hannah Scott Morphine equivalent is 30.00 MME.   Last Oral Swab was Performed 12/17/2021, it was consistent.    Pain Inventory Average Pain 7 Pain Right Now 7 My pain is constant, burning, tingling, and aching  In the last 24 hours, has pain interfered with the following? General activity 10 Relation with others 10 Enjoyment of life 10 What TIME of day is your pain at its worst? varies Sleep (in general) Poor  Pain is worse with: walking, standing, and some activites Pain improves with: rest and medication Relief from Meds: 4  Family History  Problem Relation Age of Onset   Heart failure Mother    Pneumonia Mother    Hypertension Mother    Heart disease Mother    Stroke Maternal Grandfather    Hypertension Maternal Grandfather    Heart attack Father    Heart disease Father    Asthma Paternal Uncle        PAT UNCLES   Heart attack Paternal Uncle    Social History   Socioeconomic History   Marital status: Widowed    Spouse name: Not on file   Number of children: Not on file   Years of education: Not on file   Highest education level: Not on file  Occupational History   Not on file  Tobacco Use   Smoking status: Never    Passive exposure: Never   Smokeless tobacco: Never  Vaping Use   Vaping Use: Never used  Substance and Sexual Activity   Alcohol use: No    Alcohol/week: 0.0 standard drinks of alcohol   Drug use: No   Sexual activity: Not Currently    Partners: Male    Birth  control/protection: Surgical  Other Topics Concern   Not on file  Social History Narrative   Widowed in 2015   2 sons 1 lives in the area the other is in close contact    1 grandchild   Never smoker no drug use no alcohol   Social Determinants of Health   Financial Resource Strain: Low Risk  (01/11/2022)   Overall Financial Resource Strain (CARDIA)    Difficulty of Paying Living Expenses: Not hard at all  Food Insecurity: No Food Insecurity (01/11/2022)   Hunger Vital Sign    Worried About Running Out of Food in the Last Year: Never true    [place] in the Last Year: Never true  Transportation Needs: No Transportation Needs (01/11/2022)   PRAPARE - Hydrologist (Medical): No    Lack of Transportation (Non-Medical): No  Physical Activity: Inactive (01/11/2022)   Exercise Vital Sign    Days of Exercise per Week: 0 days    Minutes of Exercise per Session: 0 min  Stress: No Stress Concern Present (01/11/2022)   [place]    Feeling of Stress : Not at all  Social Connections: [place] (  01/11/2022)   Social Connection and Isolation Panel [NHANES]    Frequency of Communication with Friends and Family: More than three times a week    Frequency of Social Gatherings with Friends and Family: More than three times a week    Attends Religious Services: More than 4 times per year    Active Member of Clubs or Organizations: Yes    Attends Music therapist: More than 4 times per year    Marital Status: Married   Past Surgical History:  Procedure Laterality Date   ANKLE SURGERY Left    ligament   APPENDECTOMY  1987   AT [place] LUMPECTOMY  2003   radiation on right   BREAST LUMPECTOMY WITH RADIOACTIVE SEED LOCALIZATION Left 02/12/2016   Procedure: LEFT BREAST LUMPECTOMY WITH RADIOACTIVE SEED LOCALIZATION;  Surgeon: Excell Seltzer,  MD;  Location: [place];  Service: General;  Laterality: Left;   CARDIOVASCULAR STRESS TEST  09/07/05   Nuclear, was negative   CATARACT EXTRACTION Bilateral 01,03   COLONOSCOPY WITH PROPOFOL N/A 05/04/2021   Procedure: COLONOSCOPY WITH PROPOFOL;  Surgeon: Gatha Mayer, MD;  [facility];  Service: Endoscopy;  Laterality: N/A;   ESOPHAGOGASTRODUODENOSCOPY (EGD) WITH PROPOFOL N/A 05/04/2021   Procedure: ESOPHAGOGASTRODUODENOSCOPY (EGD) WITH PROPOFOL;  Surgeon: Gatha Mayer, MD;  [facility];  Service: Endoscopy;  Laterality: N/A;   OOPHORECTOMY     LSO -RSO   PELVIC LAPAROSCOPY  1989   RSO, LYSIS OF ADHESIONS   POLYPECTOMY  05/04/2021   Procedure: POLYPECTOMY;  Surgeon: Gatha Mayer, MD;  [facility];  Service: Endoscopy;;   TOTAL ABDOMINAL HYSTERECTOMY  1987   LSO, APPENDECTOMY   TOTAL HIP ARTHROPLASTY Right 10/28/2013   dr Lorin Mercy   TOTAL HIP ARTHROPLASTY Right 10/28/2013   Procedure: TOTAL HIP ARTHROPLASTY ANTERIOR APPROACH;  Surgeon: Marybelle Killings, MD;  Location: [place];  Service: Orthopedics;  Laterality: Right;  Right Total Hip Arthroplasty, Direct Anterior Approach   Past Surgical History:  Procedure Laterality Date   ANKLE SURGERY Left    ligament   APPENDECTOMY  1987   AT [facility]   BACK SURGERY     Fusion   BREAST LUMPECTOMY  2003   radiation on right   BREAST LUMPECTOMY WITH RADIOACTIVE SEED LOCALIZATION Left 02/12/2016   Procedure: LEFT BREAST LUMPECTOMY WITH RADIOACTIVE SEED LOCALIZATION;  Surgeon: Excell Seltzer, MD;  Location: [place];  Service: General;  Laterality: Left;   CARDIOVASCULAR STRESS TEST  09/07/05   Nuclear, was negative   CATARACT EXTRACTION Bilateral 01,03   COLONOSCOPY WITH PROPOFOL N/A 05/04/2021   Procedure: COLONOSCOPY WITH PROPOFOL;  Surgeon: Gatha Mayer, MD;  [facility];  Service: Endoscopy;  Laterality: N/A;   ESOPHAGOGASTRODUODENOSCOPY (EGD) WITH PROPOFOL N/A  05/04/2021   Procedure: ESOPHAGOGASTRODUODENOSCOPY (EGD) WITH PROPOFOL;  Surgeon: Gatha Mayer, MD;  [facility];  Service: Endoscopy;  Laterality: N/A;   OOPHORECTOMY     LSO -RSO   PELVIC LAPAROSCOPY  1989   RSO, LYSIS OF ADHESIONS   POLYPECTOMY  05/04/2021   Procedure: POLYPECTOMY;  Surgeon: Gatha Mayer, MD;  [facility];  Service: Endoscopy;;   TOTAL ABDOMINAL HYSTERECTOMY  1987   LSO, APPENDECTOMY   TOTAL HIP ARTHROPLASTY Right 10/28/2013   dr Lorin Mercy   TOTAL HIP ARTHROPLASTY Right 10/28/2013   Procedure: TOTAL HIP ARTHROPLASTY ANTERIOR APPROACH;  Surgeon: Marybelle Killings, MD;  Location: [place];  Service: Orthopedics;  Laterality: Right;  Right Total Hip Arthroplasty, Direct Anterior Approach   Past Medical History:  Diagnosis Date   Anemia    hx   Arthritis    Asthma    PFTs, February, 2011, moderate obstructive disease with response to bronchodilators, normal lung volumes, moderate reduction in diffusing capacity   Atrial septal aneurysm    Echo, 2008-not noted on 13 echo   CAD (coronary artery disease)    90% distal LAD in the past  /   nuclear, 2008, no ischemia, ejection fraction 70%   Cancer ([place]) 2002   DUCTAL CIS--S/P LUMPECTOMY, RADIATION AND 6 WEEKS OF TAMOXIFEN   D-dimer, elevated    January, 2014   Depression    Ejection fraction    EF 60%, echo, October, 2008   Elevated CPK    January, 2014   Endometriosis 1989   RIGHT TUBE   Endometriosis 1987   LEFT TUBE/OVARY W FOCAL IN-SITU ENDOMETRIAL ADENOCARCINOMA   GERD (gastroesophageal reflux disease)    occ   H/O hiatal hernia    ?   Hyperlipidemia    Hypertension    Hypothyroidism    Patient has had in the past that she does not need treatment   Kyphoscoliosis    Obstructive airway disease ([place])    Pinched nerve    lower back   Shortness of breath    Echo 2/22: EF 60-65, no RWMA, mild LVH, GR 1  DD, GLS -24%, normal RVSF, mild MR, trivial AI, borderline dilation of aortic root (39 mm)    UTI (lower urinary tract infection)    BP 122/67 Comment: pt reported  Pulse (!) 103 Comment: pt reported  Ht 5' (1.524 m)   BMI 26.68 kg/m   Opioid Risk Score:   Fall Risk Score:  `1  Depression screen PHQ 2/9     02/08/2022    1:24 PM 01/20/2022   10:27 AM 01/11/2022    2:19 PM 12/17/2021   10:44 AM 11/26/2021    1:23 PM 08/13/2021   11:36 AM 07/20/2021    1:27 PM  Depression screen PHQ 2/9  Decreased Interest 0 0 3 0 '3 3 1  '$ Down, Depressed, Hopeless 0 0 3 0 '3 3 1  '$ PHQ - 2 Score 0 0 6 0 '6 6 2  '$ Altered sleeping   3  2    Tired, decreased energy   3  3    Change in appetite   3  3    Feeling bad or failure about yourself    0  0    Trouble concentrating   0  0    Moving slowly or fidgety/restless   0  0    Suicidal thoughts   0  0    PHQ-9 Score   15  14    Difficult doing work/chores     Somewhat difficult         Review of Systems  Musculoskeletal:  Positive for back pain.  All other systems reviewed and are negative.     Objective:   Physical Exam Vitals and nursing note reviewed.  Musculoskeletal:     Comments: No Physical Exam Performed : Telephone Visit         Assessment & Plan:  1. Lumbar post laminectomy syndrome with severe kyphoscoliosis thoracolumbar spine, s/p lumbar fusion/  02/08/2022 Continue current medication regimen. Continue  Hydrocodone 10/'325mg'$  one tablet every 8 hours may take an extra tablet  when pain is severe #100. We will continue the opioid monitoring program, this consists of regular clinic visits, examinations, urine drug screen, pill counts as well as use of [place] Controlled Substance Reporting system. A 12 month History has been reviewed on the [place] Controlled Substance Reporting System on 02/08/2022 2. Right Hip OA: S/P Right Hip Replacement 10/28/2013. 02/08/2022 3.Depression: Continue Cymbalta. Has Family Support and Friends.  Counseling with Doristine Bosworth. 02/08/2022. 4. Right Greater Trochanteric Tenderness: No  complaints today. Continue to Monitor. Continue with Ice and Heat Therapy. 02/08/2022 5. Poor Appetite/ Frail> Hannah Scott states PCP following. Continue to monitor. 02/08/2022 6. Chronic Bilateral Knee Pain: No complaints today .Continue HEP as Tolerated. Continue to Monitor. 02/08/2022 7. Chronic Bilateral shoulder pain: No complaints today Continue HEP as tolerated. Continue to Monitor. 02/08/2022   F/U in 1 month  Telephone Visit Establish Patient Location of Patient: In her Home Location of Provider : In the Office  Total Time: 10 Minutes

## 2022-02-17 ENCOUNTER — Other Ambulatory Visit: Payer: Self-pay | Admitting: Internal Medicine

## 2022-02-17 DIAGNOSIS — I517 Cardiomegaly: Secondary | ICD-10-CM

## 2022-02-17 DIAGNOSIS — I10 Essential (primary) hypertension: Secondary | ICD-10-CM

## 2022-02-17 MED ORDER — TORSEMIDE 10 MG PO TABS: 10.0000 mg | ORAL_TABLET | Freq: Every day | ORAL | 0 refills | Status: DC

## 2022-02-19 DIAGNOSIS — I5020 Unspecified systolic (congestive) heart failure: Secondary | ICD-10-CM | POA: Diagnosis not present

## 2022-02-19 DIAGNOSIS — J449 Chronic obstructive pulmonary disease, unspecified: Secondary | ICD-10-CM | POA: Diagnosis not present

## 2022-02-21 DIAGNOSIS — J449 Chronic obstructive pulmonary disease, unspecified: Secondary | ICD-10-CM | POA: Diagnosis not present

## 2022-02-21 DIAGNOSIS — I5020 Unspecified systolic (congestive) heart failure: Secondary | ICD-10-CM | POA: Diagnosis not present

## 2022-02-24 NOTE — Progress Notes (Unsigned)
Cardiology Office Note:    Date:  02/24/2022   ID:  MALA GIBBARD, DOB [birth year], MRN [id number]  PCP:  Binnie Rail, MD   [place]  Cardiologist:  Freada Bergeron, MD  Advanced Practice Provider:  No care team member to display Electrophysiologist:  None   Referring MD: Binnie Rail, MD    History of Present Illness:    Hannah Scott is a 71 y.o. female with a hx of prior MI (1991, 36) secondary to coronary vasospasm, known distal LAD disease, HTN, HLD, atrial septal aneurysm, and asthma who was previously followed by Dr. Meda Coffee who presents to clinic for follow-up.   Saw Richardson Dopp on 06/05/20 where she was continuing to have shortness of breath with minimal activity over the past several months as well as chest/neck discomfort. Concerned for angina given history of vasospasm and known distal LAD disease in th past. Myoview 12/2019 with no ischemia; low risk. BNP on that visit 255.   Saw me on 06/19/20 where she continued to have SOB. TTE reassuringly normal with LVEF 60-65%, no WMA, no significant valve disease, RAP 3. Her amlodipine was stopped due to episodes of hypotension.   Was seen on 08/06/20 where she was continuing to have SOB that was worse with allergies. Saw pulm 10/29/20 who determined to continue to hold ACE and only use bystolic as BB. Was placed on prednisone at that time.  Was admitted to [facility] in 08/2021 for septic shock secondary to LE cellulitis and UTI. Required intubation at that time. Echocardiogram 09/11/2021 with LV function greater than 75, no regional wall motion abnormality.Statin was discontinued due to elevated LFTs. Troponin was elevated (peaked at 670) and treated with heparin but discontinued due to thrombocytopenia.  She was discharged to rehab.    She was readmitted to [facility] in 09/2021 after being found unresponsive and was hypoxic to the 30s. She was intubated.  She was treated for bilateral  pneumonia.  Also was overloaded on exam and required diuresis. Echocardiogram 09/25/2021 at [facility] with LV function of 60 to 65%, normal RV function, and moderate to severe pulmonary hypertension. RVSP 61 mm Hg.  Was last seen in clinic on 12/17/21 where she was recovering from her recent hospitalizations. Remained weak. Appeared euvolemic on exam.  Today, ***  Past Medical History:  Diagnosis Date   Anemia    hx   Arthritis    Asthma    PFTs, February, 2011, moderate obstructive disease with response to bronchodilators, normal lung volumes, moderate reduction in diffusing capacity   Atrial septal aneurysm    Echo, 2008-not noted on 13 echo   CAD (coronary artery disease)    90% distal LAD in the past  /   nuclear, 2008, no ischemia, ejection fraction 70%   Cancer ([place]) 2002   DUCTAL CIS--S/P LUMPECTOMY, RADIATION AND 6 WEEKS OF TAMOXIFEN   D-dimer, elevated    January, 2014   Depression    Ejection fraction    EF 60%, echo, October, 2008   Elevated CPK    January, 2014   Endometriosis 1989   RIGHT TUBE   Endometriosis 1987   LEFT TUBE/OVARY W FOCAL IN-SITU ENDOMETRIAL ADENOCARCINOMA   GERD (gastroesophageal reflux disease)    occ   H/O hiatal hernia    ?   Hyperlipidemia    Hypertension    Hypothyroidism    Patient has had in the past that she does not need treatment  Kyphoscoliosis    Obstructive airway disease (HCC)    Pinched nerve    lower back   Shortness of breath    Echo 2/22: EF 60-65, no RWMA, mild LVH, GR 1  DD, GLS -24%, normal RVSF, mild MR, trivial AI, borderline dilation of aortic root (39 mm)   UTI (lower urinary tract infection)     Past Surgical History:  Procedure Laterality Date   ANKLE SURGERY Left    ligament   APPENDECTOMY  1987   AT [facility]   BACK SURGERY     Fusion   BREAST LUMPECTOMY  2003   radiation on right   BREAST LUMPECTOMY WITH RADIOACTIVE SEED LOCALIZATION Left 02/12/2016   Procedure: LEFT BREAST LUMPECTOMY WITH  RADIOACTIVE SEED LOCALIZATION;  Surgeon: Excell Seltzer, MD;  Location: [place];  Service: General;  Laterality: Left;   CARDIOVASCULAR STRESS TEST  09/07/05   Nuclear, was negative   CATARACT EXTRACTION Bilateral 01,03   COLONOSCOPY WITH PROPOFOL N/A 05/04/2021   Procedure: COLONOSCOPY WITH PROPOFOL;  Surgeon: Gatha Mayer, MD;  Location: [facility];  Service: Endoscopy;  Laterality: N/A;   ESOPHAGOGASTRODUODENOSCOPY (EGD) WITH PROPOFOL N/A 05/04/2021   Procedure: ESOPHAGOGASTRODUODENOSCOPY (EGD) WITH PROPOFOL;  Surgeon: Gatha Mayer, MD;  Location: [facility];  Service: Endoscopy;  Laterality: N/A;   OOPHORECTOMY     LSO -RSO   PELVIC LAPAROSCOPY  1989   RSO, LYSIS OF ADHESIONS   POLYPECTOMY  05/04/2021   Procedure: POLYPECTOMY;  Surgeon: Gatha Mayer, MD;  Location: [facility];  Service: Endoscopy;;   TOTAL ABDOMINAL HYSTERECTOMY  1987   LSO, APPENDECTOMY   TOTAL HIP ARTHROPLASTY Right 10/28/2013   dr Lorin Mercy   TOTAL HIP ARTHROPLASTY Right 10/28/2013   Procedure: TOTAL HIP ARTHROPLASTY ANTERIOR APPROACH;  Surgeon: Marybelle Killings, MD;  Location: [place];  Service: Orthopedics;  Laterality: Right;  Right Total Hip Arthroplasty, Direct Anterior Approach    Current Medications: No outpatient medications have been marked as taking for the 02/28/22 encounter (Appointment) with Freada Bergeron, MD.     Allergies:   Fluticasone-salmeterol, Norvasc [amlodipine besylate], Tape, Heparin, and Morphine   Social History   Socioeconomic History   Marital status: Widowed    Spouse name: Not on file   Number of children: Not on file   Years of education: Not on file   Highest education level: Not on file  Occupational History   Not on file  Tobacco Use   Smoking status: Never    Passive exposure: Never   Smokeless tobacco: Never  Vaping Use   Vaping Use: Never used  Substance and Sexual Activity   Alcohol use: No    Alcohol/week: 0.0 standard drinks of  alcohol   Drug use: No   Sexual activity: Not Currently    Partners: Male    Birth control/protection: Surgical  Other Topics Concern   Not on file  Social History Narrative   Widowed in 2015   2 sons 1 lives in the area the other is in close contact    1 grandchild   Never smoker no drug use no alcohol   Social Determinants of Health   Financial Resource Strain: Low Risk  (01/11/2022)   Overall Financial Resource Strain (CARDIA)    Difficulty of Paying Living Expenses: Not hard at all  Food Insecurity: No Food Insecurity (01/11/2022)   Hunger Vital Sign    Worried About Running Out of Food in the Last Year: Never true  Ran Out of Food in the Last Year: Never true  Transportation Needs: No Transportation Needs (01/11/2022)   PRAPARE - Hydrologist (Medical): No    Lack of Transportation (Non-Medical): No  Physical Activity: Inactive (01/11/2022)   Exercise Vital Sign    Days of Exercise per Week: 0 days    Minutes of Exercise per Session: 0 min  Stress: No Stress Concern Present (01/11/2022)   [place]    Feeling of Stress : Not at all  Social Connections: [place] (01/11/2022)   Social Connection and Isolation Panel [NHANES]    Frequency of Communication with Friends and Family: More than three times a week    Frequency of Social Gatherings with Friends and Family: More than three times a week    Attends Religious Services: More than 4 times per year    Active Member of [employer] or Organizations: Yes    Attends Music therapist: More than 4 times per year    Marital Status: Married     Family History: The patient's  family history includes Asthma in her paternal uncle; Heart attack in her father and paternal uncle; Heart disease in her father and mother; Heart failure in her mother; Hypertension in her maternal grandfather and mother; Pneumonia in her mother;  Stroke in her maternal grandfather.  ROS:   Please see the history of present illness.    Review of Systems  Constitutional:  Positive for malaise/fatigue.  HENT:  Negative for hearing loss.   Eyes:  Negative for redness.  Respiratory:  Positive for shortness of breath.   Cardiovascular:  Positive for leg swelling. Negative for chest pain, palpitations, orthopnea, claudication and PND.  Gastrointestinal:  Negative for melena, nausea and vomiting.  Genitourinary:  Negative for dysuria and flank pain.  Musculoskeletal:  Positive for joint pain and myalgias. Negative for falls.  Neurological:  Positive for weakness. Negative for dizziness and loss of consciousness.    EKGs/Labs/Other Studies Reviewed:    The following studies were reviewed today: TTE September 30, 2021: IMPRESSIONS     1. Left ventricular ejection fraction, by estimation, is >75%. The left  ventricle has hyperdynamic function. The left ventricle has no regional  wall motion abnormalities.   2. RV difficult to image In some views, appears dilated with depressed  function Would consider limited echo with Definity to visualize RV better  and define it's function. Right ventricular systolic function is low  normal. The right ventricular size is  normal.   3. Left atrial size was small.   4. Right atrial size was severely dilated.   5. The mitral valve is normal in structure. Trivial mitral valve  regurgitation.   6. The aortic valve is tricuspid. Aortic valve regurgitation is not  visualized.   7. The inferior vena cava is normal in size with greater than 50%  respiratory variability, suggesting right atrial pressure of 3 mmHg.   TTE 06/17/19: IMPRESSIONS   1. Left ventricular ejection fraction, by estimation, is 60 to 65%. The  left ventricle has normal function. The left ventricle has no regional  wall motion abnormalities. There is mild left ventricular hypertrophy.  Left ventricular diastolic parameters  are  consistent with Grade I diastolic dysfunction (impaired relaxation).  The average left ventricular global longitudinal strain is -24.0 %. The  global longitudinal strain is normal.   2. Right ventricular systolic function is normal. The right ventricular  size is  normal. Tricuspid regurgitation signal is inadequate for assessing  PA pressure.   3. The mitral valve is normal in structure. Mild mitral valve  regurgitation. No evidence of mitral stenosis.   4. The aortic valve was not well visualized. Aortic valve regurgitation  is trivial. No aortic stenosis is present.   5. There is borderline dilatation of the aortic root, measuring 39 mm.   6. The inferior vena cava is normal in size with greater than 50%  respiratory variability, suggesting right atrial pressure of 3 mmHg.    TTE 2019: Study Conclusions   - Left ventricle: The cavity size was normal. Wall thickness was    increased in a pattern of moderate LVH. Systolic function was    normal. The estimated ejection fraction was in the range of 55%    to 60%. Wall motion was normal; there were no regional wall    motion abnormalities. Doppler parameters are consistent with    abnormal left ventricular relaxation (grade 1 diastolic    dysfunction).  - Aortic valve: There was trivial regurgitation.  - Ascending aorta: The ascending aorta was mildly dilated.  - Mitral valve: Calcified annulus. Mildly thickened leaflets .   Impressions:   - Normal LV systolic function; moderate LVH; mild diastolic    dysfunction; trace AI; mildly dilated ascending aorta.    Myoview 12/2019: The left ventricular ejection fraction is normal (55-65%). Nuclear stress EF: 64%. No wall motion abnormalities There was no ST segment deviation noted during stress. The study is normal. This is a low risk study. There is no infarct or ischemia identified.   ECG: No new tracing today  Recent Labs: 09/12/2021: B Natriuretic Peptide 644.8 09/20/2021:  Magnesium 1.6 01/20/2022: ALT 18; BUN 17; Creatinine, Ser 0.77; Hemoglobin 12.4; Platelets 190.0; Potassium 3.5; Sodium 141; TSH 0.94  Recent Lipid Panel    Component Value Date/Time   CHOL 152 01/20/2022 0944   TRIG 79.0 01/20/2022 0944   HDL 51.00 01/20/2022 0944   CHOLHDL 3 01/20/2022 0944   VLDL 15.8 01/20/2022 0944   LDLCALC 85 01/20/2022 0944   LDLCALC 61 12/13/2019 1430   LDLDIRECT 154.8 02/20/2009 1116     Risk Assessment/Calculations:       Physical Exam:    VS:  There were no vitals taken for this visit.    Wt Readings from Last 3 Encounters:  01/20/22 136 lb 9.6 oz (62 kg)  01/20/22 137 lb (62.1 kg)  01/11/22 133 lb (60.3 kg)     GEN:  Elderly female, O2 in place, comfortable HEENT: Normal NECK: No JVD; No carotid bruits. Scoliosis present CARDIAC: RR, 2/6 systolic murmur. No rubs or gallops RESPIRATORY:  CTAB, no wheezes ABDOMEN: Soft, non-tender, non-distended MUSCULOSKELETAL:  Trace LE edema; No deformity  SKIN: Warm and dry NEUROLOGIC:  Alert and oriented x 3 PSYCHIATRIC:  Normal affect   ASSESSMENT:    No diagnosis found.    PLAN:    In order of problems listed above:  #CAD with distal LAD #Vasospasm: Myoview 12/2019 with no evidence of ischemia. TTE with normal BiV function, no significant valvular abnormalities.  Was trialed on amlodipine for possible vasospasm, however, no significant change in symptoms and patient has noted more frequent episodes of hypotension. Given reassuring cardiac work-up, no further testing needed at this time.  -Cardiac work-up including myoview and TTE reassuringly normal -Continue ASA and statin -Metop changed to bystolic '5mg'$  daily as most beta 1 selective and less likely to exacerbate her asthma -Amlodipine stopped  due to hypotension.   #Chronic Diastolic HF: Last TTE with LVEF 60-65%. Currently euvolemic and compensated. -Continue torsemide '10mg'$  daily -Continue bisoprolol '5mg'$  daily - Low Na diet - Monitor  weights  #Severe Pulmonary HTN: Likely mixed etiology in the setting of diastolic HF and underlying pulm disease. Currently euvolemic on exam. Follows with pulm. -Manage HF as above -Follow-up with Pulm as scheduled  #Chronic Respiratory Failure: #Asthma: Multifactorial with restrictive lung disease, asthma, diastolic dysfunction and obesity on top of deconditioning from recent critical illness. Follows with Pulm. Remains on supplemental O2.   -Follow-up with Pulm as scheduled -Continue supplemental O2 and inhalers  #HTN: Well controlled at home -Stopped amlodipine given episodes of hypotension  -Stopped benzapril due to concern for contribution to cough -Continue irbesartan '75mg'$  daily -Metop changed to bystolic '5mg'$  daily due to more beta-1 selectivity -Monitor blood pressures at home   #HLD: LDL 61. -Continue atorvastatin '20mg'$  daily   Medication Adjustments/Labs and Tests Ordered: Current medicines are reviewed at length with the patient today.  Concerns regarding medicines are outlined above.  No orders of the defined types were placed in this encounter.  No orders of the defined types were placed in this encounter.   There are no Patient Instructions on file for this visit.    Signed, Freada Bergeron, MD  02/24/2022 8:19 PM    [place]

## 2022-02-28 ENCOUNTER — Ambulatory Visit: Payer: Medicare Other | Attending: Cardiology | Admitting: Cardiology

## 2022-02-28 ENCOUNTER — Encounter: Payer: Self-pay | Admitting: Registered Nurse

## 2022-02-28 ENCOUNTER — Encounter: Payer: Self-pay | Admitting: Cardiology

## 2022-02-28 ENCOUNTER — Encounter: Payer: Medicare Other | Attending: Registered Nurse | Admitting: Registered Nurse

## 2022-02-28 VITALS — BP 118/73 | HR 69 | Ht 60.0 in | Wt 135.4 lb

## 2022-02-28 VITALS — BP 150/94 | Ht 60.0 in | Wt 134.6 lb

## 2022-02-28 DIAGNOSIS — M24551 Contracture, right hip: Secondary | ICD-10-CM | POA: Insufficient documentation

## 2022-02-28 DIAGNOSIS — Z79899 Other long term (current) drug therapy: Secondary | ICD-10-CM

## 2022-02-28 DIAGNOSIS — J9610 Chronic respiratory failure, unspecified whether with hypoxia or hypercapnia: Secondary | ICD-10-CM

## 2022-02-28 DIAGNOSIS — M48062 Spinal stenosis, lumbar region with neurogenic claudication: Secondary | ICD-10-CM | POA: Diagnosis not present

## 2022-02-28 DIAGNOSIS — G894 Chronic pain syndrome: Secondary | ICD-10-CM | POA: Insufficient documentation

## 2022-02-28 DIAGNOSIS — I2510 Atherosclerotic heart disease of native coronary artery without angina pectoris: Secondary | ICD-10-CM

## 2022-02-28 DIAGNOSIS — E782 Mixed hyperlipidemia: Secondary | ICD-10-CM

## 2022-02-28 DIAGNOSIS — I25119 Atherosclerotic heart disease of native coronary artery with unspecified angina pectoris: Secondary | ICD-10-CM

## 2022-02-28 DIAGNOSIS — I5032 Chronic diastolic (congestive) heart failure: Secondary | ICD-10-CM

## 2022-02-28 DIAGNOSIS — I739 Peripheral vascular disease, unspecified: Secondary | ICD-10-CM

## 2022-02-28 DIAGNOSIS — I272 Pulmonary hypertension, unspecified: Secondary | ICD-10-CM

## 2022-02-28 DIAGNOSIS — Z5181 Encounter for therapeutic drug level monitoring: Secondary | ICD-10-CM | POA: Insufficient documentation

## 2022-02-28 DIAGNOSIS — I10 Essential (primary) hypertension: Secondary | ICD-10-CM

## 2022-02-28 DIAGNOSIS — R0602 Shortness of breath: Secondary | ICD-10-CM

## 2022-02-28 DIAGNOSIS — M961 Postlaminectomy syndrome, not elsewhere classified: Secondary | ICD-10-CM | POA: Diagnosis not present

## 2022-02-28 MED ORDER — HYDROCODONE-ACETAMINOPHEN 10-325 MG PO TABS: 1.0000 | ORAL_TABLET | Freq: Three times a day (TID) | ORAL | 0 refills | Status: DC | PRN

## 2022-02-28 MED ORDER — ATORVASTATIN CALCIUM 40 MG PO TABS: 40.0000 mg | ORAL_TABLET | Freq: Every day | ORAL | 2 refills | Status: DC

## 2022-02-28 NOTE — Patient Instructions (Signed)
Medication Instructions:   INCREASE YOUR ATORVASTATIN (LIPITOR) TO 40 MG BY MOUTH DAILY  *If you need a refill on your cardiac medications before your next appointment, please call your pharmacy*   Lab Work:  IN 6-8 WEEKS HERE IN THE OFFICE--CHECK LIPIDS--PLEASE COME FASTING TO THIS LAB APPOINTMENT  If you have labs (blood work) drawn today and your tests are completely normal, you will receive your results only by: [place] (if you have MyChart) OR A paper copy in the mail If you have any lab test that is abnormal or we need to change your treatment, we will call you to review the results.   Testing/Procedures:  Your physician has requested that you have an echocardiogram. Echocardiography is a painless test that uses sound waves to create images of your heart. It provides your doctor with information about the size and shape of your heart and how well your heart's chambers and valves are working. This procedure takes approximately one hour. There are no restrictions for this procedure. Please do NOT wear cologne, perfume, aftershave, or lotions (deodorant is allowed). Please arrive 15 minutes prior to your appointment time.    Follow-Up: At [facility], you and your health needs are our priority.  As part of our continuing mission to provide you with exceptional heart care, we have created designated Provider Care Teams.  These Care Teams include your primary Cardiologist (physician) and Advanced Practice Providers (APPs -  Physician Assistants and Nurse Practitioners) who all work together to provide you with the care you need, when you need it.  We recommend signing up for the patient portal called "MyChart".  Sign up information is provided on this After Visit Summary.  MyChart is used to connect with patients for Virtual Visits (Telemedicine).  Patients are able to view lab/test results, encounter notes, upcoming appointments, etc.  Non-urgent messages can be sent  to your provider as well.   To learn more about what you can do with MyChart, go to [email or web address].    Your next appointment:   6 month(s)  The format for your next appointment:   In Person  Provider:   Freada Bergeron, MD      Important Information About Sugar

## 2022-02-28 NOTE — Progress Notes (Signed)
Cardiology Office Note:    Date:  02/28/2022   ID:  Hannah Scott, DOB [birth year], MRN [id number]  PCP:  Binnie Rail, MD   [place]  Cardiologist:  Freada Bergeron, MD  Advanced Practice Provider:  No care team member to display Electrophysiologist:  None   Referring MD: Binnie Rail, MD    History of Present Illness:    Hannah Scott is a 71 y.o. female with a hx of prior MI (1991, 63) secondary to coronary vasospasm, known distal LAD disease, HTN, HLD, atrial septal aneurysm, and asthma who was previously followed by Dr. Meda Coffee who presents to clinic for follow-up.   Saw Richardson Dopp on 06/05/20 where she was continuing to have shortness of breath with minimal activity over the past several months as well as chest/neck discomfort. Concerned for angina given history of vasospasm and known distal LAD disease in th past. Myoview 12/2019 with no ischemia; low risk. BNP on that visit 255.   Saw me on 06/19/20 where she continued to have SOB. TTE reassuringly normal with LVEF 60-65%, no WMA, no significant valve disease, RAP 3. Her amlodipine was stopped due to episodes of hypotension.   Was seen on 08/06/20 where she was continuing to have SOB that was worse with allergies. Saw pulm 10/29/20 who determined to continue to hold ACE and only use bystolic as BB. Was placed on prednisone at that time.  Was admitted to [facility] in 08/2021 for septic shock secondary to LE cellulitis and UTI. Required intubation at that time. Echocardiogram 09/11/2021 with LV function greater than 75, no regional wall motion abnormality. Statin was discontinued due to elevated LFTs. Troponin was elevated (peaked at 670) and treated with heparin but discontinued due to thrombocytopenia.  She was discharged to rehab.    She was readmitted to [facility] in 09/2021 after being found unresponsive and was hypoxic to the 30s. She was intubated.  She was treated for bilateral  pneumonia.  Also was overloaded on exam and required diuresis. Echocardiogram 09/25/2021 at [facility] with LV function of 60 to 65%, normal RV function, and moderate to severe pulmonary hypertension. RVSP 61 mm Hg.  Was last seen in clinic on 12/17/21 where she was recovering from her recent hospitalizations. Remained weak. Appeared euvolemic on exam.  Today, the patient states that she is still feeling better. Continues to have fatigue with "good days" and "bad days." No chest pain, worsening dyspnea on exertion, orthopnea or LE edema.  Her breathing has overall improved since prior visit and she is currently off of her oxygen. She had been down to using 0.5 L of oxygen and was not having wheezing, so she stopped using the oxygen.  She presents a blood pressure log today. On personal review this shows a range of 191Y-782N systolic at home. In clinic today her BP is 150/94 which they attribute to her rushing to today's appointment. On exam she is found to have a pulse of 66.  She is monitoring her weight daily for weight gain due to fluid retention. Her weight has been stable. She denies recent issues with LE edema.  She notes that her atorvastatin had been decreased to 20 mg from 40 mg due to her cholesterol being at goal consistently. However, her LDL was 85 as of 12/2021. She denies experiencing myalgias on statin therapy.  She denies any palpitations, chest pain. No lightheadedness, headaches, syncope, orthopnea, or PND.   Past Medical History:  Diagnosis Date   Anemia    hx   Arthritis    Asthma    PFTs, February, 2011, moderate obstructive disease with response to bronchodilators, normal lung volumes, moderate reduction in diffusing capacity   Atrial septal aneurysm    Echo, 2008-not noted on 13 echo   CAD (coronary artery disease)    90% distal LAD in the past  /   nuclear, 2008, no ischemia, ejection fraction 70%   Cancer ([place]) 2002   DUCTAL CIS--S/P LUMPECTOMY, RADIATION  AND 6 WEEKS OF TAMOXIFEN   D-dimer, elevated    January, 2014   Depression    Ejection fraction    EF 60%, echo, October, 2008   Elevated CPK    January, 2014   Endometriosis 1989   RIGHT TUBE   Endometriosis 1987   LEFT TUBE/OVARY W FOCAL IN-SITU ENDOMETRIAL ADENOCARCINOMA   GERD (gastroesophageal reflux disease)    occ   H/O hiatal hernia    ?   Hyperlipidemia    Hypertension    Hypothyroidism    Patient has had in the past that she does not need treatment   Kyphoscoliosis    Obstructive airway disease ([place])    Pinched nerve    lower back   Shortness of breath    Echo 2/22: EF 60-65, no RWMA, mild LVH, GR 1  DD, GLS -24%, normal RVSF, mild MR, trivial AI, borderline dilation of aortic root (39 mm)   UTI (lower urinary tract infection)     Past Surgical History:  Procedure Laterality Date   ANKLE SURGERY Left    ligament   APPENDECTOMY  1987   AT [facility]   BACK SURGERY     Fusion   BREAST LUMPECTOMY  2003   radiation on right   BREAST LUMPECTOMY WITH RADIOACTIVE SEED LOCALIZATION Left 02/12/2016   Procedure: LEFT BREAST LUMPECTOMY WITH RADIOACTIVE SEED LOCALIZATION;  Surgeon: Excell Seltzer, MD;  Location: [place];  Service: General;  Laterality: Left;   CARDIOVASCULAR STRESS TEST  09/07/05   Nuclear, was negative   CATARACT EXTRACTION Bilateral 01,03   COLONOSCOPY WITH PROPOFOL N/A 05/04/2021   Procedure: COLONOSCOPY WITH PROPOFOL;  Surgeon: Gatha Mayer, MD;  Location: [facility];  Service: Endoscopy;  Laterality: N/A;   ESOPHAGOGASTRODUODENOSCOPY (EGD) WITH PROPOFOL N/A 05/04/2021   Procedure: ESOPHAGOGASTRODUODENOSCOPY (EGD) WITH PROPOFOL;  Surgeon: Gatha Mayer, MD;  Location: [facility];  Service: Endoscopy;  Laterality: N/A;   OOPHORECTOMY     LSO -RSO   PELVIC LAPAROSCOPY  1989   RSO, LYSIS OF ADHESIONS   POLYPECTOMY  05/04/2021   Procedure: POLYPECTOMY;  Surgeon: Gatha Mayer, MD;  Location: [facility];  Service: Endoscopy;;    TOTAL ABDOMINAL HYSTERECTOMY  1987   LSO, APPENDECTOMY   TOTAL HIP ARTHROPLASTY Right 10/28/2013   dr Lorin Mercy   TOTAL HIP ARTHROPLASTY Right 10/28/2013   Procedure: TOTAL HIP ARTHROPLASTY ANTERIOR APPROACH;  Surgeon: Marybelle Killings, MD;  Location: [place];  Service: Orthopedics;  Laterality: Right;  Right Total Hip Arthroplasty, Direct Anterior Approach    Current Medications: Current Meds  Medication Sig   albuterol (VENTOLIN HFA) 108 (90 Base) MCG/ACT inhaler Inhale 2 puffs into the lungs every 4 (four) hours as needed for wheezing or shortness of breath.   aspirin EC 81 MG tablet Take 1 tablet (81 mg total) by mouth daily. Swallow whole.   atorvastatin (LIPITOR) 40 MG tablet Take 1 tablet (40 mg total) by mouth daily.   bisoprolol (  ZEBETA) 5 MG tablet Take 1 tablet (5 mg total) by mouth daily.   BREO ELLIPTA 100-25 MCG/ACT AEPB INHALE 1 PUFF INTO THE LUNGS DAILY   clobetasol cream (TEMOVATE) 3.23 % Apply 1 Application topically 2 (two) times daily.   DULoxetine (CYMBALTA) 30 MG capsule TAKE ONE CAPSULE EVERY DAY IN ADDITION TO '60MG'$  CAPSULE FOR A TOTAL OF '90MG'$  DAILY   DULoxetine (CYMBALTA) 60 MG capsule TAKE ONE CAPSULE BY MOUTH DAILY. TAKE ALONG WITH '30MG'$  CAPSULE. TOTAL DAILY DOSE = 90 MG.   famotidine (PEPCID) 20 MG tablet One after supper   gabapentin (NEURONTIN) 600 MG tablet TAKE ONE TABLET BY MOUTH EVERY DAY AT BEDTIME   HYDROcodone-acetaminophen (NORCO) 10-325 MG tablet Take 1 tablet by mouth 3 (three) times daily as needed.   ipratropium-albuterol (DUONEB) 0.5-2.5 (3) MG/3ML SOLN Take 3 mLs by nebulization every 4 (four) hours as needed (wheezing or SOB). During exacerbation   irbesartan (AVAPRO) 75 MG tablet Take 1 tablet (75 mg total) by mouth daily.   levothyroxine (SYNTHROID) 100 MCG tablet Take 1 tablet (100 mcg total) by mouth daily.   torsemide (DEMADEX) 10 MG tablet Take 1 tablet (10 mg total) by mouth daily.   Vitamin D, Ergocalciferol, (DRISDOL) 1.25 MG (50000 UNIT) CAPS  capsule Take 1 capsule (50,000 Units total) by mouth once a week.   zinc gluconate 50 MG tablet Take 1 tablet (50 mg total) by mouth daily.   [DISCONTINUED] atorvastatin (LIPITOR) 20 MG tablet Take 1 tablet (20 mg total) by mouth daily.     Allergies:   Fluticasone-salmeterol, Norvasc [amlodipine besylate], Tape, Heparin, and Morphine   Social History   Socioeconomic History   Marital status: Widowed    Spouse name: Not on file   Number of children: Not on file   Years of education: Not on file   Highest education level: Not on file  Occupational History   Not on file  Tobacco Use   Smoking status: Never    Passive exposure: Never   Smokeless tobacco: Never  Vaping Use   Vaping Use: Never used  Substance and Sexual Activity   Alcohol use: No    Alcohol/week: 0.0 standard drinks of alcohol   Drug use: No   Sexual activity: Not Currently    Partners: Male    Birth control/protection: Surgical  Other Topics Concern   Not on file  Social History Narrative   Widowed in 2015   2 sons 1 lives in the area the other is in close contact    1 grandchild   Never smoker no drug use no alcohol   Social Determinants of Health   Financial Resource Strain: Low Risk  (01/11/2022)   Overall Financial Resource Strain (CARDIA)    Difficulty of Paying Living Expenses: Not hard at all  Food Insecurity: No Food Insecurity (01/11/2022)   Hunger Vital Sign    Worried About Running Out of Food in the Last Year: Never true    [place] in the Last Year: Never true  Transportation Needs: No Transportation Needs (01/11/2022)   PRAPARE - Hydrologist (Medical): No    Lack of Transportation (Non-Medical): No  Physical Activity: Inactive (01/11/2022)   Exercise Vital Sign    Days of Exercise per Week: 0 days    Minutes of Exercise per Session: 0 min  Stress: No Stress Concern Present (01/11/2022)   [place]  Feeling of Stress : Not at all  Social Connections: Socially Integrated (01/11/2022)   Social Connection and Isolation Panel [NHANES]    Frequency of Communication with Friends and Family: More than three times a week    Frequency of Social Gatherings with Friends and Family: More than three times a week    Attends Religious Services: More than 4 times per year    Active Member of [employer] or Organizations: Yes    Attends Music therapist: More than 4 times per year    Marital Status: Married     Family History: The patient's  family history includes Asthma in her paternal uncle; Heart attack in her father and paternal uncle; Heart disease in her father and mother; Heart failure in her mother; Hypertension in her maternal grandfather and mother; Pneumonia in her mother; Stroke in her maternal grandfather.  ROS:   Please see the history of present illness.    Review of Systems  Constitutional:  Positive for malaise/fatigue.  HENT:  Negative for hearing loss.   Eyes:  Negative for redness.  Respiratory:  Negative for shortness of breath.   Cardiovascular:  Negative for chest pain, palpitations, orthopnea, claudication, leg swelling and PND.  Gastrointestinal:  Negative for melena, nausea and vomiting.  Genitourinary:  Negative for dysuria and flank pain.  Musculoskeletal:  Positive for joint pain and myalgias. Negative for falls.  Neurological:  Positive for weakness. Negative for dizziness and loss of consciousness.  Psychiatric/Behavioral:  The patient has insomnia.     EKGs/Labs/Other Studies Reviewed:    The following studies were reviewed today:  TTE 09/11/21: IMPRESSIONS   1. Left ventricular ejection fraction, by estimation, is >75%. The left  ventricle has hyperdynamic function. The left ventricle has no regional  wall motion abnormalities.   2. RV difficult to image In some views, appears dilated with depressed  function Would consider limited  echo with Definity to visualize RV better  and define it's function. Right ventricular systolic function is low  normal. The right ventricular size is  normal.   3. Left atrial size was small.   4. Right atrial size was severely dilated.   5. The mitral valve is normal in structure. Trivial mitral valve  regurgitation.   6. The aortic valve is tricuspid. Aortic valve regurgitation is not  visualized.   7. The inferior vena cava is normal in size with greater than 50%  respiratory variability, suggesting right atrial pressure of 3 mmHg.   TTE 06/17/19: IMPRESSIONS   1. Left ventricular ejection fraction, by estimation, is 60 to 65%. The  left ventricle has normal function. The left ventricle has no regional  wall motion abnormalities. There is mild left ventricular hypertrophy.  Left ventricular diastolic parameters  are consistent with Grade I diastolic dysfunction (impaired relaxation).  The average left ventricular global longitudinal strain is -24.0 %. The  global longitudinal strain is normal.   2. Right ventricular systolic function is normal. The right ventricular  size is normal. Tricuspid regurgitation signal is inadequate for assessing  PA pressure.   3. The mitral valve is normal in structure. Mild mitral valve  regurgitation. No evidence of mitral stenosis.   4. The aortic valve was not well visualized. Aortic valve regurgitation  is trivial. No aortic stenosis is present.   5. There is borderline dilatation of the aortic root, measuring 39 mm.   6. The inferior vena cava is normal in size with greater than 50%  respiratory variability, suggesting  right atrial pressure of 3 mmHg.    TTE 2019: Study Conclusions   - Left ventricle: The cavity size was normal. Wall thickness was    increased in a pattern of moderate LVH. Systolic function was    normal. The estimated ejection fraction was in the range of 55%    to 60%. Wall motion was normal; there were no regional wall     motion abnormalities. Doppler parameters are consistent with    abnormal left ventricular relaxation (grade 1 diastolic    dysfunction).  - Aortic valve: There was trivial regurgitation.  - Ascending aorta: The ascending aorta was mildly dilated.  - Mitral valve: Calcified annulus. Mildly thickened leaflets .   Impressions:   - Normal LV systolic function; moderate LVH; mild diastolic    dysfunction; trace AI; mildly dilated ascending aorta.    Myoview 12/2019: The left ventricular ejection fraction is normal (55-65%). Nuclear stress EF: 64%. No wall motion abnormalities There was no ST segment deviation noted during stress. The study is normal. This is a low risk study. There is no infarct or ischemia identified.   EKG:   EKG is personally reviewed. 02/28/2022:  EKG was not ordered. 09/10/2021 (ED):  NSR at 99 bpm. Cannot rule out Anterior infarct, age undetermined. ST&T wave abnormality, consider inferior ischemia.  Recent Labs: 09/12/2021: B Natriuretic Peptide 644.8 09/20/2021: Magnesium 1.6 01/20/2022: ALT 18; BUN 17; Creatinine, Ser 0.77; Hemoglobin 12.4; Platelets 190.0; Potassium 3.5; Sodium 141; TSH 0.94   Recent Lipid Panel    Component Value Date/Time   CHOL 152 01/20/2022 0944   TRIG 79.0 01/20/2022 0944   HDL 51.00 01/20/2022 0944   CHOLHDL 3 01/20/2022 0944   VLDL 15.8 01/20/2022 0944   LDLCALC 85 01/20/2022 0944   LDLCALC 61 12/13/2019 1430   LDLDIRECT 154.8 02/20/2009 1116     Risk Assessment/Calculations:       Physical Exam:    VS:  BP (!) 150/94   Ht 5' (1.524 m)   Wt 134 lb 9.6 oz (61.1 kg)   BMI 26.29 kg/m     Wt Readings from Last 3 Encounters:  02/28/22 134 lb 9.6 oz (61.1 kg)  02/28/22 135 lb 6.4 oz (61.4 kg)  01/20/22 136 lb 9.6 oz (62 kg)     GEN:  Elderly female, O2 in place, comfortable HEENT: Normal NECK: No JVD; No carotid bruits. Scoliosis present CARDIAC: RR, 2/6 systolic murmur. No rubs or gallops RESPIRATORY:  Diminished  but clear ABDOMEN: Soft, non-tender, non-distended MUSCULOSKELETAL:  No edema, warm SKIN: Warm and dry NEUROLOGIC:  Alert and oriented x 3 PSYCHIATRIC:  Normal affect   ASSESSMENT:    1. Coronary artery disease involving native coronary artery of native heart with angina pectoris ([place])   2. Severe pulmonary hypertension ([place])   3. Mixed hyperlipidemia   4. Medication management   5. Chronic respiratory failure, unspecified whether with hypoxia or hypercapnia ([place])   6. Coronary artery disease involving native coronary artery of native heart without angina pectoris   7. Chronic diastolic CHF (congestive heart failure) ([place])   8. Vasospasm ([place])   9. Essential hypertension   10. Shortness of breath       PLAN:    In order of problems listed above:  #CAD with distal LAD #Vasospasm: Myoview 12/2019 with no evidence of ischemia. TTE with normal BiV function, no significant valvular abnormalities.  Was trialed on amlodipine for possible vasospasm, however, no significant change in symptoms and patient has noted  more frequent episodes of hypotension. Given reassuring cardiac work-up, no further testing needed at this time.  -Cardiac work-up including myoview and TTE reassuringly normal -Continue ASA and statin -Metop changed to bystolic '5mg'$  daily as most beta 1 selective and less likely to exacerbate her asthma -Amlodipine stopped due to hypotension.   #Chronic Diastolic HF: Last TTE with LVEF 60-65%. Currently euvolemic and compensated. -Continue torsemide '10mg'$  daily -Continue bisoprolol '5mg'$  daily - Low Na diet - Monitor weights  #Severe Pulmonary HTN: Likely mixed etiology in the setting of diastolic HF and underlying pulm disease. Currently euvolemic on exam. Follows with pulm. -Check TTE for monitoring -Manage HF as above -Follow-up with Pulm as scheduled  #Restrictive Lung Disease: #Asthma: Multifactorial with restrictive lung disease, asthma, diastolic dysfunction and  obesity on top of deconditioning from recent critical illness. Follows with Pulm. Now off supplemental oxygen.  -Follow-up with Pulm as scheduled  #HTN: Well controlled at home. -Stopped amlodipine given episodes of hypotension  -Stopped benzapril due to concern for contribution to cough -Continue irbesartan '75mg'$  daily -Metop changed to bystolic '5mg'$  daily due to more beta-1 selectivity -Monitor blood pressures at home   #HLD: LDL elevated at 85. Will change back to lipitor '40mg'$  daily -Increase lipitor to '40mg'$  daily -Repeat lipids in 6-8 weeks for monitoring  Follow-up:  6 months.  Medication Adjustments/Labs and Tests Ordered: Current medicines are reviewed at length with the patient today.  Concerns regarding medicines are outlined above.   Orders Placed This Encounter  Procedures   Lipid Profile   ECHOCARDIOGRAM COMPLETE   Meds ordered this encounter  Medications   atorvastatin (LIPITOR) 40 MG tablet    Sig: Take 1 tablet (40 mg total) by mouth daily.    Dispense:  90 tablet    Refill:  2    Dose increase   Patient Instructions  Medication Instructions:   INCREASE YOUR ATORVASTATIN (LIPITOR) TO 40 MG BY MOUTH DAILY  *If you need a refill on your cardiac medications before your next appointment, please call your pharmacy*   Lab Work:  IN 6-8 WEEKS HERE IN THE OFFICE--CHECK LIPIDS--PLEASE COME FASTING TO THIS LAB APPOINTMENT  If you have labs (blood work) drawn today and your tests are completely normal, you will receive your results only by: [place] (if you have MyChart) OR A paper copy in the mail If you have any lab test that is abnormal or we need to change your treatment, we will call you to review the results.   Testing/Procedures:  Your physician has requested that you have an echocardiogram. Echocardiography is a painless test that uses sound waves to create images of your heart. It provides your doctor with information about the size and shape of  your heart and how well your heart's chambers and valves are working. This procedure takes approximately one hour. There are no restrictions for this procedure. Please do NOT wear cologne, perfume, aftershave, or lotions (deodorant is allowed). Please arrive 15 minutes prior to your appointment time.    Follow-Up: At [facility], you and your health needs are our priority.  As part of our continuing mission to provide you with exceptional heart care, we have created designated Provider Care Teams.  These Care Teams include your primary Cardiologist (physician) and Advanced Practice Providers (APPs -  Physician Assistants and Nurse Practitioners) who all work together to provide you with the care you need, when you need it.  We recommend signing up for the patient portal called "MyChart".  Sign up information is provided on this After Visit Summary.  MyChart is used to connect with patients for Virtual Visits (Telemedicine).  Patients are able to view lab/test results, encounter notes, upcoming appointments, etc.  Non-urgent messages can be sent to your provider as well.   To learn more about what you can do with MyChart, go to [email or web address].    Your next appointment:   6 month(s)  The format for your next appointment:   In Person  Provider:   Freada Bergeron, MD      Important Information About Sugar        I,Mathew Stumpf,acting as a scribe for Freada Bergeron, MD.,have documented all relevant documentation on the behalf of Freada Bergeron, MD,as directed by  Freada Bergeron, MD while in the presence of Freada Bergeron, MD.  I, Freada Bergeron, MD, have reviewed all documentation for this visit. The documentation on 02/28/22 for the exam, diagnosis, procedures, and orders are all accurate and complete.    Signed, Freada Bergeron, MD  02/28/2022 3:40 PM    [place]

## 2022-02-28 NOTE — Progress Notes (Signed)
Subjective:    Patient ID: Hannah Scott, female    DOB: 10/21/1950, 71 y.o.   MRN: [id number]  HPI: Hannah Scott is a 71 y.o. female who returns for follow up appointment for chronic pain and medication refill. She states her pain is located in her lower back. She rates her pain 7. Her current exercise regime is walking short distances with her walker.   Hannah Scott Morphine equivalent is 30.00 MME.   Last Oral Swab was Performed on 12/17/2021, it was consistent.     Pain Inventory Average Pain 7 Pain Right Now 7 My pain is constant, burning, tingling, and aching  In the last 24 hours, has pain interfered with the following? General activity 10 Relation with others 10 Enjoyment of life 10 What TIME of day is your pain at its worst? morning , daytime, evening, and night Sleep (in general) Poor  Pain is worse with: walking and standing Pain improves with: rest and medication Relief from Meds: 5  Family History  Problem Relation Age of Onset   Heart failure Mother    Pneumonia Mother    Hypertension Mother    Heart disease Mother    Stroke Maternal Grandfather    Hypertension Maternal Grandfather    Heart attack Father    Heart disease Father    Asthma Paternal Uncle        PAT UNCLES   Heart attack Paternal Uncle    Social History   Socioeconomic History   Marital status: Widowed    Spouse name: Not on file   Number of children: Not on file   Years of education: Not on file   Highest education level: Not on file  Occupational History   Not on file  Tobacco Use   Smoking status: Never    Passive exposure: Never   Smokeless tobacco: Never  Vaping Use   Vaping Use: Never used  Substance and Sexual Activity   Alcohol use: No    Alcohol/week: 0.0 standard drinks of alcohol   Drug use: No   Sexual activity: Not Currently    Partners: Male    Birth control/protection: Surgical  Other Topics Concern   Not on file  Social History Narrative   Widowed in 2015   2 sons  1 lives in the area the other is in close contact    1 grandchild   Never smoker no drug use no alcohol   Social Determinants of Health   Financial Resource Strain: Low Risk  (01/11/2022)   Overall Financial Resource Strain (CARDIA)    Difficulty of Paying Living Expenses: Not hard at all  Food Insecurity: No Food Insecurity (01/11/2022)   Hunger Vital Sign    Worried About Running Out of Food in the Last Year: Never true    [place] in the Last Year: Never true  Transportation Needs: No Transportation Needs (01/11/2022)   PRAPARE - Hydrologist (Medical): No    Lack of Transportation (Non-Medical): No  Physical Activity: Inactive (01/11/2022)   Exercise Vital Sign    Days of Exercise per Week: 0 days    Minutes of Exercise per Session: 0 min  Stress: No Stress Concern Present (01/11/2022)   [place]    Feeling of Stress : Not at all  Social Connections: [place] (01/11/2022)   Social Connection and Isolation Panel [NHANES]    Frequency of Communication with  Friends and Family: More than three times a week    Frequency of Social Gatherings with Friends and Family: More than three times a week    Attends Religious Services: More than 4 times per year    Active Member of Clubs or Organizations: Yes    Attends Music therapist: More than 4 times per year    Marital Status: Married   Past Surgical History:  Procedure Laterality Date   ANKLE SURGERY Left    ligament   APPENDECTOMY  1987   AT [place] LUMPECTOMY  2003   radiation on right   BREAST LUMPECTOMY WITH RADIOACTIVE SEED LOCALIZATION Left 02/12/2016   Procedure: LEFT BREAST LUMPECTOMY WITH RADIOACTIVE SEED LOCALIZATION;  Surgeon: Excell Seltzer, MD;  Location: [place];  Service: General;  Laterality: Left;   CARDIOVASCULAR STRESS TEST  09/07/05    Nuclear, was negative   CATARACT EXTRACTION Bilateral 01,03   COLONOSCOPY WITH PROPOFOL N/A 05/04/2021   Procedure: COLONOSCOPY WITH PROPOFOL;  Surgeon: Gatha Mayer, MD;  [facility];  Service: Endoscopy;  Laterality: N/A;   ESOPHAGOGASTRODUODENOSCOPY (EGD) WITH PROPOFOL N/A 05/04/2021   Procedure: ESOPHAGOGASTRODUODENOSCOPY (EGD) WITH PROPOFOL;  Surgeon: Gatha Mayer, MD;  [facility];  Service: Endoscopy;  Laterality: N/A;   OOPHORECTOMY     LSO -RSO   PELVIC LAPAROSCOPY  1989   RSO, LYSIS OF ADHESIONS   POLYPECTOMY  05/04/2021   Procedure: POLYPECTOMY;  Surgeon: Gatha Mayer, MD;  [facility];  Service: Endoscopy;;   TOTAL ABDOMINAL HYSTERECTOMY  1987   LSO, APPENDECTOMY   TOTAL HIP ARTHROPLASTY Right 10/28/2013   dr Lorin Mercy   TOTAL HIP ARTHROPLASTY Right 10/28/2013   Procedure: TOTAL HIP ARTHROPLASTY ANTERIOR APPROACH;  Surgeon: Marybelle Killings, MD;  Location: [place];  Service: Orthopedics;  Laterality: Right;  Right Total Hip Arthroplasty, Direct Anterior Approach   Past Surgical History:  Procedure Laterality Date   ANKLE SURGERY Left    ligament   APPENDECTOMY  1987   AT [facility]   BACK SURGERY     Fusion   BREAST LUMPECTOMY  2003   radiation on right   BREAST LUMPECTOMY WITH RADIOACTIVE SEED LOCALIZATION Left 02/12/2016   Procedure: LEFT BREAST LUMPECTOMY WITH RADIOACTIVE SEED LOCALIZATION;  Surgeon: Excell Seltzer, MD;  Location: [place];  Service: General;  Laterality: Left;   CARDIOVASCULAR STRESS TEST  09/07/05   Nuclear, was negative   CATARACT EXTRACTION Bilateral 01,03   COLONOSCOPY WITH PROPOFOL N/A 05/04/2021   Procedure: COLONOSCOPY WITH PROPOFOL;  Surgeon: Gatha Mayer, MD;  [facility];  Service: Endoscopy;  Laterality: N/A;   ESOPHAGOGASTRODUODENOSCOPY (EGD) WITH PROPOFOL N/A 05/04/2021   Procedure: ESOPHAGOGASTRODUODENOSCOPY (EGD) WITH PROPOFOL;  Surgeon: Gatha Mayer, MD;  [facility];   Service: Endoscopy;  Laterality: N/A;   OOPHORECTOMY     LSO -RSO   PELVIC LAPAROSCOPY  1989   RSO, LYSIS OF ADHESIONS   POLYPECTOMY  05/04/2021   Procedure: POLYPECTOMY;  Surgeon: Gatha Mayer, MD;  [facility];  Service: Endoscopy;;   TOTAL ABDOMINAL HYSTERECTOMY  1987   LSO, APPENDECTOMY   TOTAL HIP ARTHROPLASTY Right 10/28/2013   dr Lorin Mercy   TOTAL HIP ARTHROPLASTY Right 10/28/2013   Procedure: TOTAL HIP ARTHROPLASTY ANTERIOR APPROACH;  Surgeon: Marybelle Killings, MD;  Location: [place];  Service: Orthopedics;  Laterality: Right;  Right Total Hip Arthroplasty, Direct  Anterior Approach   Past Medical History:  Diagnosis Date   Anemia    hx   Arthritis    Asthma    PFTs, February, 2011, moderate obstructive disease with response to bronchodilators, normal lung volumes, moderate reduction in diffusing capacity   Atrial septal aneurysm    Echo, 2008-not noted on 13 echo   CAD (coronary artery disease)    90% distal LAD in the past  /   nuclear, 2008, no ischemia, ejection fraction 70%   Cancer ([place]) 2002   DUCTAL CIS--S/P LUMPECTOMY, RADIATION AND 6 WEEKS OF TAMOXIFEN   D-dimer, elevated    January, 2014   Depression    Ejection fraction    EF 60%, echo, October, 2008   Elevated CPK    January, 2014   Endometriosis 1989   RIGHT TUBE   Endometriosis 1987   LEFT TUBE/OVARY W FOCAL IN-SITU ENDOMETRIAL ADENOCARCINOMA   GERD (gastroesophageal reflux disease)    occ   H/O hiatal hernia    ?   Hyperlipidemia    Hypertension    Hypothyroidism    Patient has had in the past that she does not need treatment   Kyphoscoliosis    Obstructive airway disease ([place])    Pinched nerve    lower back   Shortness of breath    Echo 2/22: EF 60-65, no RWMA, mild LVH, GR 1  DD, GLS -24%, normal RVSF, mild MR, trivial AI, borderline dilation of aortic root (39 mm)   UTI (lower urinary tract infection)    There were no vitals taken for this visit.  Opioid Risk Score:   Fall Risk Score:   `1  Depression screen PHQ 2/9     02/08/2022    1:24 PM 01/20/2022   10:27 AM 01/11/2022    2:19 PM 12/17/2021   10:44 AM 11/26/2021    1:23 PM 08/13/2021   11:36 AM 07/20/2021    1:27 PM  Depression screen PHQ 2/9  Decreased Interest 0 0 3 0 '3 3 1  '$ Down, Depressed, Hopeless 0 0 3 0 '3 3 1  '$ PHQ - 2 Score 0 0 6 0 '6 6 2  '$ Altered sleeping   3  2    Tired, decreased energy   3  3    Change in appetite   3  3    Feeling bad or failure about yourself    0  0    Trouble concentrating   0  0    Moving slowly or fidgety/restless   0  0    Suicidal thoughts   0  0    PHQ-9 Score   15  14    Difficult doing work/chores     Somewhat difficult       Review of Systems  Musculoskeletal:  Positive for back pain.       Left arm pain Left knee pain Right hip pain  All other systems reviewed and are negative.     Objective:   Physical Exam Vitals and nursing note reviewed.  Constitutional:      Appearance: Normal appearance.  Cardiovascular:     Rate and Rhythm: Normal rate and regular rhythm.     Pulses: Normal pulses.     Heart sounds: Normal heart sounds.  Pulmonary:     Effort: Pulmonary effort is normal.     Breath sounds: Normal breath sounds.  Musculoskeletal:     Cervical back: Normal range of motion and neck supple.  Comments: Normal Muscle Bulk and Muscle Testing Reveals:  Upper Extremities: Full ROM and Muscle Strength 5/5  Lumbar Paraspinal Tenderness: L-4-L-5 Lower Extremities: Full ROM and Muscle Strength 5/5 Arises from Chair slowly using walker for support Antalgic  Gait     Skin:    General: Skin is warm and dry.  Neurological:     Mental Status: She is alert and oriented to person, place, and time.  Psychiatric:        Mood and Affect: Mood normal.        Behavior: Behavior normal.         Assessment & Plan:  1. Lumbar post laminectomy syndrome with severe kyphoscoliosis thoracolumbar spine, s/p lumbar fusion/  02/28/2022 Continue current medication  regimen. Continue  Hydrocodone 10/'325mg'$  one tablet every 8 hours may take an extra tablet when pain is severe #100. We will continue the opioid monitoring program, this consists of regular clinic visits, examinations, urine drug screen, pill counts as well as use of [place] Controlled Substance Reporting system. A 12 month History has been reviewed on the [place] Controlled Substance Reporting System on 02/28/2022 2. Right Hip OA: S/P Right Hip Replacement 10/28/2013. 02/28/2022 3.Depression: Continue Cymbalta. Has Family Support and Friends.  Counseling with Doristine Bosworth. 02/28/2022. 4. Right Greater Trochanteric Tenderness: No complaints today. Continue to Monitor. Continue with Ice and Heat Therapy. 02/28/2022 5. Poor Appetite/ Frail: Ms. Middlebrooks states PCP following. Continue to monitor. 02/28/2022 6. Chronic Bilateral Knee Pain: No complaints today .Continue HEP as Tolerated. Continue to Monitor. 02/28/2022 7. Chronic Bilateral shoulder pain: No complaints today Continue HEP as tolerated. Continue to Monitor. 02/28/2022  F/U in 1 month

## 2022-03-01 DIAGNOSIS — I5020 Unspecified systolic (congestive) heart failure: Secondary | ICD-10-CM | POA: Diagnosis not present

## 2022-03-01 DIAGNOSIS — J449 Chronic obstructive pulmonary disease, unspecified: Secondary | ICD-10-CM | POA: Diagnosis not present

## 2022-03-22 DIAGNOSIS — I5020 Unspecified systolic (congestive) heart failure: Secondary | ICD-10-CM | POA: Diagnosis not present

## 2022-03-22 DIAGNOSIS — J449 Chronic obstructive pulmonary disease, unspecified: Secondary | ICD-10-CM | POA: Diagnosis not present

## 2022-03-24 ENCOUNTER — Telehealth: Payer: Self-pay | Admitting: Emergency Medicine

## 2022-03-24 DIAGNOSIS — I5020 Unspecified systolic (congestive) heart failure: Secondary | ICD-10-CM | POA: Diagnosis not present

## 2022-03-24 DIAGNOSIS — J449 Chronic obstructive pulmonary disease, unspecified: Secondary | ICD-10-CM | POA: Diagnosis not present

## 2022-03-25 NOTE — Telephone Encounter (Signed)
Pt is scheduled for 03/28/2022 arrive at 1:00 at [facility] I have called and left detailed message

## 2022-03-28 DIAGNOSIS — C50911 Malignant neoplasm of unspecified site of right female breast: Secondary | ICD-10-CM | POA: Diagnosis not present

## 2022-03-28 DIAGNOSIS — J9811 Atelectasis: Secondary | ICD-10-CM | POA: Diagnosis not present

## 2022-03-28 DIAGNOSIS — J432 Centrilobular emphysema: Secondary | ICD-10-CM | POA: Diagnosis not present

## 2022-03-28 DIAGNOSIS — I700 Atherosclerosis of aorta: Secondary | ICD-10-CM | POA: Diagnosis not present

## 2022-03-31 DIAGNOSIS — I5020 Unspecified systolic (congestive) heart failure: Secondary | ICD-10-CM | POA: Diagnosis not present

## 2022-03-31 DIAGNOSIS — J449 Chronic obstructive pulmonary disease, unspecified: Secondary | ICD-10-CM | POA: Diagnosis not present

## 2022-04-07 ENCOUNTER — Ambulatory Visit: Payer: Medicare Other | Admitting: Primary Care

## 2022-04-08 ENCOUNTER — Encounter: Payer: Medicare Other | Attending: Registered Nurse | Admitting: Registered Nurse

## 2022-04-08 ENCOUNTER — Encounter: Payer: Self-pay | Admitting: Registered Nurse

## 2022-04-08 ENCOUNTER — Ambulatory Visit: Payer: Medicare Other | Admitting: Emergency Medicine

## 2022-04-08 ENCOUNTER — Encounter: Payer: Self-pay | Admitting: Emergency Medicine

## 2022-04-08 VITALS — BP 130/76 | HR 74 | Temp 98.2°F | Ht 60.0 in | Wt 137.0 lb

## 2022-04-08 VITALS — BP 143/80 | HR 72 | Ht 60.0 in | Wt 136.0 lb

## 2022-04-08 DIAGNOSIS — M961 Postlaminectomy syndrome, not elsewhere classified: Secondary | ICD-10-CM | POA: Diagnosis not present

## 2022-04-08 DIAGNOSIS — Z79891 Long term (current) use of opiate analgesic: Secondary | ICD-10-CM | POA: Insufficient documentation

## 2022-04-08 DIAGNOSIS — J4540 Moderate persistent asthma, uncomplicated: Secondary | ICD-10-CM

## 2022-04-08 DIAGNOSIS — R0609 Other forms of dyspnea: Secondary | ICD-10-CM | POA: Diagnosis not present

## 2022-04-08 DIAGNOSIS — M24551 Contracture, right hip: Secondary | ICD-10-CM | POA: Insufficient documentation

## 2022-04-08 DIAGNOSIS — M48062 Spinal stenosis, lumbar region with neurogenic claudication: Secondary | ICD-10-CM | POA: Diagnosis not present

## 2022-04-08 DIAGNOSIS — R9389 Abnormal findings on diagnostic imaging of other specified body structures: Secondary | ICD-10-CM

## 2022-04-08 DIAGNOSIS — G894 Chronic pain syndrome: Secondary | ICD-10-CM | POA: Insufficient documentation

## 2022-04-08 DIAGNOSIS — Z5181 Encounter for therapeutic drug level monitoring: Secondary | ICD-10-CM | POA: Insufficient documentation

## 2022-04-08 MED ORDER — HYDROCODONE-ACETAMINOPHEN 10-325 MG PO TABS: 1.0000 | ORAL_TABLET | Freq: Three times a day (TID) | ORAL | 0 refills | Status: DC | PRN

## 2022-04-08 NOTE — Assessment & Plan Note (Signed)
Multifactorial, due to her asthma as well as significant restriction from her kyphoscoliosis.  Also component of deconditioning.

## 2022-04-08 NOTE — Assessment & Plan Note (Signed)
Please continue your Breo once daily.  Rinse and gargle after using. Keep your albuterol available to use 2 puffs if needed for shortness of breath, chest tightness, wheezing. Flu shot is up-to-date Agree with getting the COVID-19 vaccine at your pharmacy Follow Dr. Lamonte Sakai in July 2024 or sooner if you have any problems.

## 2022-04-08 NOTE — Patient Instructions (Signed)
We reviewed your CT scan of the chest today.  The right lower lobe nodular opacity has completely resolved.  This is good news.  We do not need to repeat any more CT scans. Please continue your Breo once daily.  Rinse and gargle after using. Keep your albuterol available to use 2 puffs if needed for shortness of breath, chest tightness, wheezing. Flu shot is up-to-date Agree with getting the COVID-19 vaccine at your pharmacy Follow Dr. Lamonte Sakai in July 2024 or sooner if you have any problems.

## 2022-04-08 NOTE — Assessment & Plan Note (Signed)
Some bibasilar left greater than right atelectatic change but no concerning nodules.  The previously defined nodule has resolved.  Reassured her about these findings.  No dedicated follow-up planned.  We reviewed your CT scan of the chest today.  The right lower lobe nodular opacity has completely resolved.  This is good news.  We do not need to repeat any more CT scans.

## 2022-04-08 NOTE — Progress Notes (Addendum)
Subjective:    Patient ID: Hannah Scott, female    DOB: [birth year], 71 y.o.   MRN: [id number]  HPI: Hannah Scott is a 71 y.o. female who is scheduled for telephone visit, we have  discussed the limitations of evaluation and management by telemedicine and the availability of in person appointments. The patient expressed understanding and agreed to proceed.  She states her pain is located in her lower back. She rates her pain 7. Her current exercise regime is walking with her walker.  Ms. Agopian Morphine equivalent is 30.00 MME. Last Oral Swab was Performed on 12/17/2021, it was consistent.   Pain Inventory Average Pain 7 Pain Right Now 7 My pain is constant, burning, and aching  In the last 24 hours, has pain interfered with the following? General activity 10 Relation with others 10 Enjoyment of life 10 What TIME of day is your pain at its worst? morning , daytime, evening, and night Sleep (in general) Poor  Pain is worse with: walking, bending, sitting, inactivity, standing, and some activites Pain improves with: rest and medication Relief from Meds: 6  Family History  Problem Relation Age of Onset   Heart failure Mother    Pneumonia Mother    Hypertension Mother    Heart disease Mother    Stroke Maternal Grandfather    Hypertension Maternal Grandfather    Heart attack Father    Heart disease Father    Asthma Paternal Uncle        PAT UNCLES   Heart attack Paternal Uncle    Social History   Socioeconomic History   Marital status: Widowed    Spouse name: Not on file   Number of children: Not on file   Years of education: Not on file   Highest education level: Not on file  Occupational History   Not on file  Tobacco Use   Smoking status: Never    Passive exposure: Never   Smokeless tobacco: Never  Vaping Use   Vaping Use: Never used  Substance and Sexual Activity   Alcohol use: No    Alcohol/week: 0.0 standard drinks of alcohol   Drug use: No   Sexual activity:  Not Currently    Partners: Male    Birth control/protection: Surgical  Other Topics Concern   Not on file  Social History Narrative   Widowed in 2015   2 sons 1 lives in the area the other is in close contact    1 grandchild   Never smoker no drug use no alcohol   Social Determinants of Health   Financial Resource Strain: Low Risk  (01/11/2022)   Overall Financial Resource Strain (CARDIA)    Difficulty of Paying Living Expenses: Not hard at all  Food Insecurity: No Food Insecurity (01/11/2022)   Hunger Vital Sign    Worried About Running Out of Food in the Last Year: Never true    Ran Out of Food in the Last Year: Never true  Transportation Needs: No Transportation Needs (01/11/2022)   PRAPARE - Administrator, Civil Service (Medical): No    Lack of Transportation (Non-Medical): No  Physical Activity: Inactive (01/11/2022)   Exercise Vital Sign    Days of Exercise per Week: 0 days    Minutes of Exercise per Session: 0 min  Stress: No Stress Concern Present (01/11/2022)   [employer] of Occupational Health - Occupational Stress Questionnaire    Feeling of Stress : Not at all  Social  Connections: Socially Integrated (01/11/2022)   Social Connection and Isolation Panel [NHANES]    Frequency of Communication with Friends and Family: More than three times a week    Frequency of Social Gatherings with Friends and Family: More than three times a week    Attends Religious Services: More than 4 times per year    Active Member of Clubs or Organizations: Yes    Attends Engineer, structural: More than 4 times per year    Marital Status: Married   Past Surgical History:  Procedure Laterality Date   ANKLE SURGERY Left    ligament   APPENDECTOMY  1987   AT [facility]   BACK SURGERY     Fusion   BREAST LUMPECTOMY  2003   radiation on right   BREAST LUMPECTOMY WITH RADIOACTIVE SEED LOCALIZATION Left 02/12/2016   Procedure: LEFT BREAST LUMPECTOMY WITH RADIOACTIVE SEED  LOCALIZATION;  Surgeon: Glenna Fellows, MD;  Location: [facility];  Service: General;  Laterality: Left;   CARDIOVASCULAR STRESS TEST  09/07/05   Nuclear, was negative   CATARACT EXTRACTION Bilateral 01,03   COLONOSCOPY WITH PROPOFOL N/A 05/04/2021   Procedure: COLONOSCOPY WITH PROPOFOL;  Surgeon: Iva Boop, MD;  [facility];  Service: Endoscopy;  Laterality: N/A;   ESOPHAGOGASTRODUODENOSCOPY (EGD) WITH PROPOFOL N/A 05/04/2021   Procedure: ESOPHAGOGASTRODUODENOSCOPY (EGD) WITH PROPOFOL;  Surgeon: Iva Boop, MD;  [facility];  Service: Endoscopy;  Laterality: N/A;   OOPHORECTOMY     LSO -RSO   PELVIC LAPAROSCOPY  1989   RSO, LYSIS OF ADHESIONS   POLYPECTOMY  05/04/2021   Procedure: POLYPECTOMY;  Surgeon: Iva Boop, MD;  [facility];  Service: Endoscopy;;   TOTAL ABDOMINAL HYSTERECTOMY  1987   LSO, APPENDECTOMY   TOTAL HIP ARTHROPLASTY Right 10/28/2013   dr Ophelia Charter   TOTAL HIP ARTHROPLASTY Right 10/28/2013   Procedure: TOTAL HIP ARTHROPLASTY ANTERIOR APPROACH;  Surgeon: Eldred Manges, MD;  Location: [facility];  Service: Orthopedics;  Laterality: Right;  Right Total Hip Arthroplasty, Direct Anterior Approach   Past Surgical History:  Procedure Laterality Date   ANKLE SURGERY Left    ligament   APPENDECTOMY  1987   AT [facility]   BACK SURGERY     Fusion   BREAST LUMPECTOMY  2003   radiation on right   BREAST LUMPECTOMY WITH RADIOACTIVE SEED LOCALIZATION Left 02/12/2016   Procedure: LEFT BREAST LUMPECTOMY WITH RADIOACTIVE SEED LOCALIZATION;  Surgeon: Glenna Fellows, MD;  Location: [facility];  Service: General;  Laterality: Left;   CARDIOVASCULAR STRESS TEST  09/07/05   Nuclear, was negative   CATARACT EXTRACTION Bilateral 01,03   COLONOSCOPY WITH PROPOFOL N/A 05/04/2021   Procedure: COLONOSCOPY WITH PROPOFOL;  Surgeon: Iva Boop, MD;  [facility];  Service: Endoscopy;  Laterality: N/A;    ESOPHAGOGASTRODUODENOSCOPY (EGD) WITH PROPOFOL N/A 05/04/2021   Procedure: ESOPHAGOGASTRODUODENOSCOPY (EGD) WITH PROPOFOL;  Surgeon: Iva Boop, MD;  [facility];  Service: Endoscopy;  Laterality: N/A;   OOPHORECTOMY     LSO -RSO   PELVIC LAPAROSCOPY  1989   RSO, LYSIS OF ADHESIONS   POLYPECTOMY  05/04/2021   Procedure: POLYPECTOMY;  Surgeon: Iva Boop, MD;  [facility];  Service: Endoscopy;;   TOTAL ABDOMINAL HYSTERECTOMY  1987   LSO, APPENDECTOMY   TOTAL HIP ARTHROPLASTY Right 10/28/2013   dr Ophelia Charter   TOTAL HIP ARTHROPLASTY Right 10/28/2013   Procedure: TOTAL HIP ARTHROPLASTY ANTERIOR APPROACH;  Surgeon: Loraine Leriche  Becky Sax, MD;  Location: [facility];  Service: Orthopedics;  Laterality: Right;  Right Total Hip Arthroplasty, Direct Anterior Approach   Past Medical History:  Diagnosis Date   Anemia    hx   Arthritis    Asthma    PFTs, February, 2011, moderate obstructive disease with response to bronchodilators, normal lung volumes, moderate reduction in diffusing capacity   Atrial septal aneurysm    Echo, 2008-not noted on 13 echo   CAD (coronary artery disease)    90% distal LAD in the past  /   nuclear, 2008, no ischemia, ejection fraction 70%   Cancer ([facility]) 2002   DUCTAL CIS--S/P LUMPECTOMY, RADIATION AND 6 WEEKS OF TAMOXIFEN   D-dimer, elevated    January, 2014   Depression    Ejection fraction    EF 60%, echo, October, 2008   Elevated CPK    January, 2014   Endometriosis 1989   RIGHT TUBE   Endometriosis 1987   LEFT TUBE/OVARY W FOCAL IN-SITU ENDOMETRIAL ADENOCARCINOMA   GERD (gastroesophageal reflux disease)    occ   H/O hiatal hernia    ?   Hyperlipidemia    Hypertension    Hypothyroidism    Patient has had in the past that she does not need treatment   Kyphoscoliosis    Obstructive airway disease ([facility])    Pinched nerve    lower back   Shortness of breath    Echo 2/22: EF 60-65, no RWMA, mild LVH, GR 1  DD, GLS -24%, normal RVSF, mild MR,  trivial AI, borderline dilation of aortic root (39 mm)   UTI (lower urinary tract infection)    BP (!) 143/80 Comment: pt reported  Pulse 72   Ht 5' (1.524 m)   Wt 136 lb (61.7 kg) Comment: Pt reported  BMI 26.56 kg/m   Opioid Risk Score:   Fall Risk Score:  `1  Depression screen PHQ 2/9     04/08/2022    1:47 PM 02/08/2022    1:24 PM 01/20/2022   10:27 AM 01/11/2022    2:19 PM 12/17/2021   10:44 AM 11/26/2021    1:23 PM 08/13/2021   11:36 AM  Depression screen PHQ 2/9  Decreased Interest 0 0 0 3 0 3 3  Down, Depressed, Hopeless 0 0 0 3 0 3 3  PHQ - 2 Score 0 0 0 6 0 6 6  Altered sleeping    3  2   Tired, decreased energy    3  3   Change in appetite    3  3   Feeling bad or failure about yourself     0  0   Trouble concentrating    0  0   Moving slowly or fidgety/restless    0  0   Suicidal thoughts    0  0   PHQ-9 Score    15  14   Difficult doing work/chores      Somewhat difficult       Review of Systems  Musculoskeletal:  Positive for back pain.  All other systems reviewed and are negative.     Objective:   Physical Exam Vitals and nursing note reviewed.  Musculoskeletal:     Comments: No Physical Exam Performed: Telephone Visit         Assessment & Plan:  1. Lumbar post laminectomy syndrome with severe kyphoscoliosis thoracolumbar spine, s/p lumbar fusion/  04/08/2022 Continue current medication regimen. Continue  Hydrocodone 10/325mg  one  tablet every 8 hours may take an extra tablet when pain is severe #100. We will continue the opioid monitoring program, this consists of regular clinic visits, examinations, urine drug screen, pill counts as well as use of [place] Controlled Substance Reporting system. A 12 month History has been reviewed on the [place] Controlled Substance Reporting System on 04/08/2022 2. Right Hip OA: S/P Right Hip Replacement 10/28/2013. 04/08/2022 3.Depression: Continue Cymbalta. Has Family Support and Friends.   Counseling with Renato Gails. 04/08/2022. 4. Right Greater Trochanteric Tenderness: No complaints today. Continue to Monitor. Continue with Ice and Heat Therapy. 04/08/2022 5. Poor Appetite/ Frail: No complaints today. Ms. Lundie states PCP following. Continue to monitor. 04/08/2022 6. Chronic Bilateral Knee Pain: No complaints today .Continue HEP as Tolerated. Continue to Monitor. 04/08/2022 7. Chronic Bilateral shoulder pain: No complaints today Continue HEP as tolerated. Continue to Monitor. 04/08/2022   F/U in 1 month Telephone Visit Establish Patient Location of Patient: In Her Home Location of Provider: In the Office Total Time Spent: 10 Minutes

## 2022-04-08 NOTE — Progress Notes (Signed)
Subjective:    Patient ID: Hannah Scott, female    DOB: November 14, 1950, 71 y.o.   MRN: [id number]  HPI 71 year old never smoker with a history of childhood asthma, continued into adulthood.  She has been seen by Dr. Melvyn Novas in our office for obstructive lung disease, also has GERD, rhinitis. PMH: CAD, ductal breast CIS (lumpectomy, tamoxifen), hypertension, endometriosis, kyphoscoliosis She is referred today for abnormal CT scan of the chest.   She has been ill lately - multiple hospitalizations with sepsis from UTI + cellulitis, volume overload, possible B LL PNA. She required intubation, ventilated 1 day in setting of this as well as symptomatic hypoglycemia and encephalopathy. She is on breo, uses albuterol a few times a month. She is using O2 at 0.5L/min.   She had a CT-PA done at [facility] that identified elevated bilateral hemidiaphragms, some right basilar atelectasis versus small 5 mm nodule.  This was done when she was hospitalized.  She also had a swallowing evaluation.  She was sent home on supplemental oxygen.  CT-PA 09/26/21 at [facility] reviewed by me, shows a tortuous aorta but no mediastinal or hilar adenopathy.  Small bilateral pleural effusions with moderate bibasilar atelectasis more so on the right.  Both hemidiaphragms are somewhat elevated.  There is superior segmental right lower lobe pulmonary nodule versus area of atelectasis that measures 5 mm.   ROV 04/08/22 --follow-up visit 71 year old woman with a history of asthma, GERD, rhinitis, CAD, ductal breast CIS, endometriosis, hypertension.  I saw her in August for an abnormal CT scan of the chest that showed some right basilar atelectasis, possible 5 mm right lower lobe nodule.  We elected to repeat her CT chest which was done 03/28/2022 as below. She is on Breo, uses albuterol rarely. Stable exertional SOB. Using o2 prn for heavier exertion.  Flu shot up to date, needs the covid vaccine.   CT chest 03/28/2022 reviewed by me shows no  evidence of mediastinal or hilar adenopathy, centrilobular emphysematous change.  Some chronic left basilar atelectasis, slightly improved.  Her right lower lobe superior segmental nodular opacity has completely resolved.  No other nodules or masses noted   Review of Systems As per HPI  Past Medical History:  Diagnosis Date   Anemia    hx   Arthritis    Asthma    PFTs, February, 2011, moderate obstructive disease with response to bronchodilators, normal lung volumes, moderate reduction in diffusing capacity   Atrial septal aneurysm    Echo, 2008-not noted on 13 echo   CAD (coronary artery disease)    90% distal LAD in the past  /   nuclear, 2008, no ischemia, ejection fraction 70%   Cancer ([place]) 2002   DUCTAL CIS--S/P LUMPECTOMY, RADIATION AND 6 WEEKS OF TAMOXIFEN   D-dimer, elevated    January, 2014   Depression    Ejection fraction    EF 60%, echo, October, 2008   Elevated CPK    January, 2014   Endometriosis 1989   RIGHT TUBE   Endometriosis 1987   LEFT TUBE/OVARY W FOCAL IN-SITU ENDOMETRIAL ADENOCARCINOMA   GERD (gastroesophageal reflux disease)    occ   H/O hiatal hernia    ?   Hyperlipidemia    Hypertension    Hypothyroidism    Patient has had in the past that she does not need treatment   Kyphoscoliosis    Obstructive airway disease ([place])    Pinched nerve    lower back   Shortness of breath  Echo 2/22: EF 60-65, no RWMA, mild LVH, GR 1  DD, GLS -24%, normal RVSF, mild MR, trivial AI, borderline dilation of aortic root (39 mm)   UTI (lower urinary tract infection)      Family History  Problem Relation Age of Onset   Heart failure Mother    Pneumonia Mother    Hypertension Mother    Heart disease Mother    Stroke Maternal Grandfather    Hypertension Maternal Grandfather    Heart attack Father    Heart disease Father    Asthma Paternal Uncle        PAT UNCLES   Heart attack Paternal Uncle      Social History   Socioeconomic History   Marital  status: Widowed    Spouse name: Not on file   Number of children: Not on file   Years of education: Not on file   Highest education level: Not on file  Occupational History   Not on file  Tobacco Use   Smoking status: Never    Passive exposure: Never   Smokeless tobacco: Never  Vaping Use   Vaping Use: Never used  Substance and Sexual Activity   Alcohol use: No    Alcohol/week: 0.0 standard drinks of alcohol   Drug use: No   Sexual activity: Not Currently    Partners: Male    Birth control/protection: Surgical  Other Topics Concern   Not on file  Social History Narrative   Widowed in 2015   2 sons 1 lives in the area the other is in close contact    1 grandchild   Never smoker no drug use no alcohol   Social Determinants of Health   Financial Resource Strain: Low Risk  (01/11/2022)   Overall Financial Resource Strain (CARDIA)    Difficulty of Paying Living Expenses: Not hard at all  Food Insecurity: No Food Insecurity (01/11/2022)   Hunger Vital Sign    Worried About Running Out of Food in the Last Year: Never true    [place] in the Last Year: Never true  Transportation Needs: No Transportation Needs (01/11/2022)   PRAPARE - Hydrologist (Medical): No    Lack of Transportation (Non-Medical): No  Physical Activity: Inactive (01/11/2022)   Exercise Vital Sign    Days of Exercise per Week: 0 days    Minutes of Exercise per Session: 0 min  Stress: No Stress Concern Present (01/11/2022)   [place]    Feeling of Stress : Not at all  Social Connections: [place] (01/11/2022)   Social Connection and Isolation Panel [NHANES]    Frequency of Communication with Friends and Family: More than three times a week    Frequency of Social Gatherings with Friends and Family: More than three times a week    Attends Religious Services: More than 4 times per year    Active  Member of [employer] or Organizations: Yes    Attends Archivist Meetings: More than 4 times per year    Marital Status: Married  Human resources officer Violence: Not At Risk (01/11/2022)   Humiliation, Afraid, Rape, and Kick questionnaire    Fear of Current or Ex-Partner: No    Emotionally Abused: No    Physically Abused: No    Sexually Abused: No     Allergies  Allergen Reactions   Fluticasone-Salmeterol Other (See Comments)    REACTION: headache.  She  is tolerating Dulera well. Other reaction(s): Other (See Comments) REACTION: headache.  She is tolerating Dulera well.   Norvasc [Amlodipine Besylate] Swelling and Other (See Comments)    edema   Tape Other (See Comments)    Prefers paper tape Other reaction(s): Other (See Comments) Prefers paper tape- "everything else rips my skin"   Heparin    Morphine Nausea Only     Outpatient Medications Prior to Visit  Medication Sig Dispense Refill   albuterol (VENTOLIN HFA) 108 (90 Base) MCG/ACT inhaler Inhale 2 puffs into the lungs every 4 (four) hours as needed for wheezing or shortness of breath. 1 each 11   aspirin EC 81 MG tablet Take 1 tablet (81 mg total) by mouth daily. Swallow whole. 90 tablet 3   atorvastatin (LIPITOR) 40 MG tablet Take 1 tablet (40 mg total) by mouth daily. 90 tablet 2   bisoprolol (ZEBETA) 5 MG tablet Take 1 tablet (5 mg total) by mouth daily. 90 tablet 1   BREO ELLIPTA 100-25 MCG/ACT AEPB INHALE 1 PUFF INTO THE LUNGS DAILY 60 each 11   clobetasol cream (TEMOVATE) 3.26 % Apply 1 Application topically 2 (two) times daily. 30 g 2   DULoxetine (CYMBALTA) 30 MG capsule TAKE ONE CAPSULE EVERY DAY IN ADDITION TO '60MG'$  CAPSULE FOR A TOTAL OF '90MG'$  DAILY 90 capsule 1   DULoxetine (CYMBALTA) 60 MG capsule TAKE ONE CAPSULE BY MOUTH DAILY. TAKE ALONG WITH '30MG'$  CAPSULE. TOTAL DAILY DOSE = 90 MG. 30 capsule 1   famotidine (PEPCID) 20 MG tablet One after supper 90 tablet 2   gabapentin (NEURONTIN) 600 MG tablet TAKE ONE  TABLET BY MOUTH EVERY DAY AT BEDTIME 90 tablet 1   HYDROcodone-acetaminophen (NORCO) 10-325 MG tablet Take 1 tablet by mouth 3 (three) times daily as needed. 90 tablet 0   ipratropium-albuterol (DUONEB) 0.5-2.5 (3) MG/3ML SOLN Take 3 mLs by nebulization every 4 (four) hours as needed (wheezing or SOB). During exacerbation 360 mL 1   irbesartan (AVAPRO) 75 MG tablet Take 1 tablet (75 mg total) by mouth daily. 90 tablet 1   levothyroxine (SYNTHROID) 100 MCG tablet Take 1 tablet (100 mcg total) by mouth daily. 90 tablet 3   torsemide (DEMADEX) 10 MG tablet Take 1 tablet (10 mg total) by mouth daily. 90 tablet 0   Ubrogepant (UBRELVY) 50 MG TABS Take 50 mg by mouth as needed (for migraine). may repeat dose x1 after 2h 10 tablet 5   Vitamin D, Ergocalciferol, (DRISDOL) 1.25 MG (50000 UNIT) CAPS capsule Take 1 capsule (50,000 Units total) by mouth once a week. 12 capsule 0   zinc gluconate 50 MG tablet Take 1 tablet (50 mg total) by mouth daily. 90 tablet 1   No facility-administered medications prior to visit.        Objective:   Physical Exam Vitals:   04/08/22 0946  BP: 130/76  Pulse: 74  Temp: 98.2 F (36.8 C)  TempSrc: Oral  SpO2: 98%  Weight: 137 lb (62.1 kg)  Height: 5' (1.524 m)    Gen: Pleasant, obese elderly woman, in no distress,  normal affect  ENT: No lesions,  mouth clear,  oropharynx clear, no postnasal drip  Neck: No JVD, some hoarse voice without stridor  Lungs: No use of accessory muscles, decreased to both bases but no crackles or wheezes  Cardiovascular: RRR, heart sounds normal, no murmur or gallops, no peripheral edema  Musculoskeletal: Severe kyphoscoliosis  Neuro: alert, awake, non focal  Skin: Warm, no lesions or  rash      Assessment & Plan:   Moderate persistent asthma Please continue your Breo once daily.  Rinse and gargle after using. Keep your albuterol available to use 2 puffs if needed for shortness of breath, chest tightness, wheezing. Flu  shot is up-to-date Agree with getting the COVID-19 vaccine at your pharmacy Follow Dr. Lamonte Sakai in July 2024 or sooner if you have any problems.  Abnormal CT of the chest Some bibasilar left greater than right atelectatic change but no concerning nodules.  The previously defined nodule has resolved.  Reassured her about these findings.  No dedicated follow-up planned.  We reviewed your CT scan of the chest today.  The right lower lobe nodular opacity has completely resolved.  This is good news.  We do not need to repeat any more CT scans.  DOE (dyspnea on exertion) Multifactorial, due to her asthma as well as significant restriction from her kyphoscoliosis.  Also component of deconditioning.   Baltazar Apo, MD, PhD 04/08/2022, 12:59 PM [facility] [phone] or if no answer before 7:00PM call [phone] For any issues after 7:00PM please call eLink [phone]

## 2022-04-14 ENCOUNTER — Ambulatory Visit: Payer: Medicare Other

## 2022-04-14 ENCOUNTER — Ambulatory Visit: Payer: Medicare Other | Attending: Cardiology

## 2022-04-14 DIAGNOSIS — I25119 Atherosclerotic heart disease of native coronary artery with unspecified angina pectoris: Secondary | ICD-10-CM | POA: Insufficient documentation

## 2022-04-14 DIAGNOSIS — E782 Mixed hyperlipidemia: Secondary | ICD-10-CM | POA: Insufficient documentation

## 2022-04-14 DIAGNOSIS — Z79899 Other long term (current) drug therapy: Secondary | ICD-10-CM

## 2022-04-14 DIAGNOSIS — I272 Pulmonary hypertension, unspecified: Secondary | ICD-10-CM | POA: Insufficient documentation

## 2022-04-14 LAB — ECHOCARDIOGRAM COMPLETE
Area-P 1/2: 4.74 cm2
MV M vel: 3.63 m/s
MV Peak grad: 52.7 mmHg
S' Lateral: 2.1 cm

## 2022-04-14 LAB — LIPID PANEL
Chol/HDL Ratio: 2 ratio (ref 0.0–4.4)
Cholesterol, Total: 118 mg/dL (ref 100–199)
HDL: 60 mg/dL (ref >39)
LDL Chol Calc (NIH): 45 mg/dL (ref 0–99)
Triglycerides: 57 mg/dL (ref 0–149)
VLDL Cholesterol Cal: 13 mg/dL (ref 5–40)

## 2022-04-15 ENCOUNTER — Telehealth: Payer: Self-pay | Admitting: *Deleted

## 2022-04-15 ENCOUNTER — Other Ambulatory Visit: Payer: Self-pay | Admitting: Internal Medicine

## 2022-04-15 DIAGNOSIS — I371 Nonrheumatic pulmonary valve insufficiency: Secondary | ICD-10-CM

## 2022-04-15 DIAGNOSIS — I351 Nonrheumatic aortic (valve) insufficiency: Secondary | ICD-10-CM

## 2022-04-15 DIAGNOSIS — I340 Nonrheumatic mitral (valve) insufficiency: Secondary | ICD-10-CM

## 2022-04-15 NOTE — Telephone Encounter (Signed)
-----   Message from Nuala Alpha, LPN sent at 70/23/0172  7:22 AM EST -----  ----- Message ----- From: Freada Bergeron, MD Sent: 04/14/2022   3:57 PM EST To: [facility] Triage  Her echo shows normal pumping function. There is stable moderately elevated blood pressure in her lungs, which actually appear slightly improved from her prior echo. There is mild leakiness of her mitral valve, moderate leakiness of her pulmonic valve and mild leakiness of her aortic valve. We will continue monitoring with yearly echoes going forward.

## 2022-04-15 NOTE — Telephone Encounter (Signed)
The patient has been notified of the result and verbalized understanding.  All questions (if any) were answered.  Pt aware she will need another echo in one year for surveillance of her valves.  She is aware that I will go ahead and place the order for repeat echo in one year in the system and send a message to our Echo Scheduler to call her back and arrange this appt for that time.  Pt verbalized understanding and agrees with this plan.

## 2022-04-21 DIAGNOSIS — J449 Chronic obstructive pulmonary disease, unspecified: Secondary | ICD-10-CM | POA: Diagnosis not present

## 2022-04-21 DIAGNOSIS — I5020 Unspecified systolic (congestive) heart failure: Secondary | ICD-10-CM | POA: Diagnosis not present

## 2022-04-23 DIAGNOSIS — I5020 Unspecified systolic (congestive) heart failure: Secondary | ICD-10-CM | POA: Diagnosis not present

## 2022-04-23 DIAGNOSIS — J449 Chronic obstructive pulmonary disease, unspecified: Secondary | ICD-10-CM | POA: Diagnosis not present

## 2022-05-01 DIAGNOSIS — J449 Chronic obstructive pulmonary disease, unspecified: Secondary | ICD-10-CM | POA: Diagnosis not present

## 2022-05-01 DIAGNOSIS — I5020 Unspecified systolic (congestive) heart failure: Secondary | ICD-10-CM | POA: Diagnosis not present

## 2022-05-11 ENCOUNTER — Encounter: Payer: Self-pay | Admitting: Registered Nurse

## 2022-05-11 ENCOUNTER — Encounter: Payer: Medicare Other | Attending: Registered Nurse | Admitting: Registered Nurse

## 2022-05-11 VITALS — BP 125/82 | HR 69 | Ht 60.0 in | Wt 136.0 lb

## 2022-05-11 DIAGNOSIS — M961 Postlaminectomy syndrome, not elsewhere classified: Secondary | ICD-10-CM | POA: Diagnosis not present

## 2022-05-11 DIAGNOSIS — Z5181 Encounter for therapeutic drug level monitoring: Secondary | ICD-10-CM | POA: Diagnosis not present

## 2022-05-11 DIAGNOSIS — G8929 Other chronic pain: Secondary | ICD-10-CM | POA: Insufficient documentation

## 2022-05-11 DIAGNOSIS — G894 Chronic pain syndrome: Secondary | ICD-10-CM | POA: Insufficient documentation

## 2022-05-11 DIAGNOSIS — M25511 Pain in right shoulder: Secondary | ICD-10-CM | POA: Insufficient documentation

## 2022-05-11 DIAGNOSIS — M24551 Contracture, right hip: Secondary | ICD-10-CM | POA: Diagnosis not present

## 2022-05-11 DIAGNOSIS — Z79891 Long term (current) use of opiate analgesic: Secondary | ICD-10-CM | POA: Insufficient documentation

## 2022-05-11 DIAGNOSIS — M48062 Spinal stenosis, lumbar region with neurogenic claudication: Secondary | ICD-10-CM | POA: Diagnosis not present

## 2022-05-11 MED ORDER — HYDROCODONE-ACETAMINOPHEN 10-325 MG PO TABS: 1.0000 | ORAL_TABLET | Freq: Three times a day (TID) | ORAL | 0 refills | Status: DC | PRN

## 2022-05-11 NOTE — Progress Notes (Signed)
Subjective:    Patient ID: Hannah Scott, female    DOB: 01/28/1951, 72 y.o.   MRN: [id number]  HPI: Hannah Scott is a 72 y.o. female who returns for follow up appointment for chronic pain and medication refill. She states her pain is located in her right shoulder and lower back pain. She rates her pain 6. Her current exercise regime is walking short distances with walker.   Hannah Scott is 30.00 MME.   Last Oral Swab was Performed on 12/18/2022, it was consistent.    Pain Inventory Average Pain 6 Pain Right Now 6 My pain is constant, burning, tingling, and aching  In the last 24 hours, has pain interfered with the following? General activity 10 Relation with others 10 Enjoyment of life 10 What TIME of day is your pain at its worst? morning , daytime, evening, night, and varies Sleep (in general) Poor  Pain is worse with: walking, standing, and some activites Pain improves with: rest and medication Relief from Meds: 5  Family History  Problem Relation Age of Onset   Heart failure Mother    Pneumonia Mother    Hypertension Mother    Heart disease Mother    Stroke Maternal Grandfather    Hypertension Maternal Grandfather    Heart attack Father    Heart disease Father    Asthma Paternal Uncle        PAT UNCLES   Heart attack Paternal Uncle    Social History   Socioeconomic History   Marital status: Widowed    Spouse name: Not on file   Number of children: Not on file   Years of education: Not on file   Highest education level: Not on file  Occupational History   Not on file  Tobacco Use   Smoking status: Never    Passive exposure: Never   Smokeless tobacco: Never  Vaping Use   Vaping Use: Never used  Substance and Sexual Activity   Alcohol use: No    Alcohol/week: 0.0 standard drinks of alcohol   Drug use: No   Sexual activity: Not Currently    Partners: Male    Birth control/protection: Surgical  Other Topics Concern   Not on file  Social  History Narrative   Widowed in 2015   2 sons 1 lives in the area the other is in close contact    1 grandchild   Never smoker no drug use no alcohol   Social Determinants of Health   Financial Resource Strain: Low Risk  (01/11/2022)   Overall Financial Resource Strain (CARDIA)    Difficulty of Paying Living Expenses: Not hard at all  Food Insecurity: No Food Insecurity (01/11/2022)   Hunger Vital Sign    Worried About Running Out of Food in the Last Year: Never true    [place] in the Last Year: Never true  Transportation Needs: No Transportation Needs (01/11/2022)   PRAPARE - Hydrologist (Medical): No    Lack of Transportation (Non-Medical): No  Physical Activity: Inactive (01/11/2022)   Exercise Vital Sign    Days of Exercise per Week: 0 days    Minutes of Exercise per Session: 0 min  Stress: No Stress Concern Present (01/11/2022)   [place]    Feeling of Stress : Not at all  Social Connections: [place] (01/11/2022)   Social Connection and Isolation Panel [NHANES]  Frequency of Communication with Friends and Family: More than three times a week    Frequency of Social Gatherings with Friends and Family: More than three times a week    Attends Religious Services: More than 4 times per year    Active Member of Clubs or Organizations: Yes    Attends Music therapist: More than 4 times per year    Marital Status: Married   Past Surgical History:  Procedure Laterality Date   ANKLE SURGERY Left    ligament   APPENDECTOMY  1987   AT [facility] LUMPECTOMY  2003   radiation on right   BREAST LUMPECTOMY WITH RADIOACTIVE SEED LOCALIZATION Left 02/12/2016   Procedure: LEFT BREAST LUMPECTOMY WITH RADIOACTIVE SEED LOCALIZATION;  Surgeon: Excell Seltzer, MD;  Location: [facility];  Service: General;  Laterality:  Left;   CARDIOVASCULAR STRESS TEST  09/07/05   Nuclear, was negative   CATARACT EXTRACTION Bilateral 01,03   COLONOSCOPY WITH PROPOFOL N/A 05/04/2021   Procedure: COLONOSCOPY WITH PROPOFOL;  Surgeon: Gatha Mayer, MD;  Location: [facility];  Service: Endoscopy;  Laterality: N/A;   ESOPHAGOGASTRODUODENOSCOPY (EGD) WITH PROPOFOL N/A 05/04/2021   Procedure: ESOPHAGOGASTRODUODENOSCOPY (EGD) WITH PROPOFOL;  Surgeon: Gatha Mayer, MD;  Location: [facility];  Service: Endoscopy;  Laterality: N/A;   OOPHORECTOMY     LSO -RSO   PELVIC LAPAROSCOPY  1989   RSO, LYSIS OF ADHESIONS   POLYPECTOMY  05/04/2021   Procedure: POLYPECTOMY;  Surgeon: Gatha Mayer, MD;  Location: [facility];  Service: Endoscopy;;   TOTAL ABDOMINAL HYSTERECTOMY  1987   LSO, APPENDECTOMY   TOTAL HIP ARTHROPLASTY Right 10/28/2013   dr Lorin Mercy   TOTAL HIP ARTHROPLASTY Right 10/28/2013   Procedure: TOTAL HIP ARTHROPLASTY ANTERIOR APPROACH;  Surgeon: Marybelle Killings, MD;  Location: [facility];  Service: Orthopedics;  Laterality: Right;  Right Total Hip Arthroplasty, Direct Anterior Approach   Past Surgical History:  Procedure Laterality Date   ANKLE SURGERY Left    ligament   APPENDECTOMY  1987   AT [facility]   BACK SURGERY     Fusion   BREAST LUMPECTOMY  2003   radiation on right   BREAST LUMPECTOMY WITH RADIOACTIVE SEED LOCALIZATION Left 02/12/2016   Procedure: LEFT BREAST LUMPECTOMY WITH RADIOACTIVE SEED LOCALIZATION;  Surgeon: Excell Seltzer, MD;  Location: [facility];  Service: General;  Laterality: Left;   CARDIOVASCULAR STRESS TEST  09/07/05   Nuclear, was negative   CATARACT EXTRACTION Bilateral 01,03   COLONOSCOPY WITH PROPOFOL N/A 05/04/2021   Procedure: COLONOSCOPY WITH PROPOFOL;  Surgeon: Gatha Mayer, MD;  Location: [facility];  Service: Endoscopy;  Laterality: N/A;   ESOPHAGOGASTRODUODENOSCOPY (EGD) WITH PROPOFOL N/A 05/04/2021   Procedure: ESOPHAGOGASTRODUODENOSCOPY (EGD) WITH PROPOFOL;   Surgeon: Gatha Mayer, MD;  Location: [facility];  Service: Endoscopy;  Laterality: N/A;   OOPHORECTOMY     LSO -RSO   PELVIC LAPAROSCOPY  1989   RSO, LYSIS OF ADHESIONS   POLYPECTOMY  05/04/2021   Procedure: POLYPECTOMY;  Surgeon: Gatha Mayer, MD;  Location: [facility];  Service: Endoscopy;;   TOTAL ABDOMINAL HYSTERECTOMY  1987   LSO, APPENDECTOMY   TOTAL HIP ARTHROPLASTY Right 10/28/2013   dr Lorin Mercy   TOTAL HIP ARTHROPLASTY Right 10/28/2013   Procedure: TOTAL HIP ARTHROPLASTY ANTERIOR APPROACH;  Surgeon: Marybelle Killings, MD;  Location: [facility];  Service: Orthopedics;  Laterality: Right;  Right  Total Hip Arthroplasty, Direct Anterior Approach   Past Medical History:  Diagnosis Date   Anemia    hx   Arthritis    Asthma    PFTs, February, 2011, moderate obstructive disease with response to bronchodilators, normal lung volumes, moderate reduction in diffusing capacity   Atrial septal aneurysm    Echo, 2008-not noted on 13 echo   CAD (coronary artery disease)    90% distal LAD in the past  /   nuclear, 2008, no ischemia, ejection fraction 70%   Cancer ([place]) 2002   DUCTAL CIS--S/P LUMPECTOMY, RADIATION AND 6 WEEKS OF TAMOXIFEN   D-dimer, elevated    January, 2014   Depression    Ejection fraction    EF 60%, echo, October, 2008   Elevated CPK    January, 2014   Endometriosis 1989   RIGHT TUBE   Endometriosis 1987   LEFT TUBE/OVARY W FOCAL IN-SITU ENDOMETRIAL ADENOCARCINOMA   GERD (gastroesophageal reflux disease)    occ   H/O hiatal hernia    ?   Hyperlipidemia    Hypertension    Hypothyroidism    Patient has had in the past that she does not need treatment   Kyphoscoliosis    Obstructive airway disease ([place])    Pinched nerve    lower back   Shortness of breath    Echo 2/22: EF 60-65, no RWMA, mild LVH, GR 1  DD, GLS -24%, normal RVSF, mild MR, trivial AI, borderline dilation of aortic root (39 mm)   UTI (lower urinary tract infection)    There were no vitals taken  for this visit.  Opioid Risk Score:   Fall Risk Score:  `1  Depression screen PHQ 2/9     04/08/2022    1:47 PM 02/08/2022    1:24 PM 01/20/2022   10:27 AM 01/11/2022    2:19 PM 12/17/2021   10:44 AM 11/26/2021    1:23 PM 08/13/2021   11:36 AM  Depression screen PHQ 2/9  Decreased Interest 0 0 0 3 0 3 3  Down, Depressed, Hopeless 0 0 0 3 0 3 3  PHQ - 2 Score 0 0 0 6 0 6 6  Altered sleeping    3  2   Tired, decreased energy    3  3   Change in appetite    3  3   Feeling bad or failure about yourself     0  0   Trouble concentrating    0  0   Moving slowly or fidgety/restless    0  0   Suicidal thoughts    0  0   PHQ-9 Score    15  14   Difficult doing work/chores      Somewhat difficult       Review of Systems  Musculoskeletal:  Positive for gait problem and neck pain.       Right shoulder pain  All other systems reviewed and are negative.      Objective:   Physical Exam Vitals and nursing note reviewed.  Constitutional:      Appearance: Normal appearance.  Cardiovascular:     Rate and Rhythm: Normal rate and regular rhythm.     Pulses: Normal pulses.     Heart sounds: Normal heart sounds.  Pulmonary:     Effort: Pulmonary effort is normal.     Breath sounds: Normal breath sounds.  Musculoskeletal:     Cervical back: Normal range of motion and  neck supple.     Comments: Normal Muscle Bulk and Muscle Testing Reveals:  Upper Extremities: Full ROM and Muscle Strength 5/5  Lumbar Paraspinal Tenderness: L-4-L-5 Bilateral Greater Trochanter Tenderness Lower Extremities: Full ROM and Muscle Strength 5/5 Arises from Table slowly using walker for support Narrow Based  Gait     Skin:    General: Skin is warm and dry.  Neurological:     Mental Status: She is alert and oriented to person, place, and time.  Psychiatric:        Mood and Affect: Mood normal.        Behavior: Behavior normal.         Assessment & Plan:  1. Lumbar post laminectomy syndrome with  severe kyphoscoliosis thoracolumbar spine, s/p lumbar fusion/  05/11/2022 Continue current medication regimen. Continue  Hydrocodone 10/'325mg'$  one tablet every 8 hours may take an extra tablet when pain is severe #100. We will continue the opioid monitoring program, this consists of regular clinic visits, examinations, urine drug screen, pill counts as well as use of [place] Controlled Substance Reporting system. A 12 month History has been reviewed on the [place] Controlled Substance Reporting System on 05/11/2022 2. Right Hip OA: S/P Right Hip Replacement 10/28/2013. 05/11/2022 3.Depression: Continue Cymbalta. Has Family Support and Friends.  Counseling with Doristine Bosworth. 05/11/2022. 4. Right Greater Trochanteric Tenderness: No complaints today. Continue to Monitor. Continue with Ice and Heat Therapy. 05/11/2022 5. Poor Appetite/ Frail: No complaints today. Hannah Scott states PCP following. Continue to monitor. 05/11/2022 6. Chronic Bilateral Knee Pain: No complaints today .Continue HEP as Tolerated. Continue to Monitor. 05/11/2022 7. Chronic Right  shoulder pain:  Continue HEP as tolerated. Continue to Monitor. 05/11/2022   F/U in 1 month

## 2022-05-16 ENCOUNTER — Other Ambulatory Visit: Payer: Self-pay | Admitting: Internal Medicine

## 2022-05-16 DIAGNOSIS — I10 Essential (primary) hypertension: Secondary | ICD-10-CM

## 2022-05-16 DIAGNOSIS — I517 Cardiomegaly: Secondary | ICD-10-CM

## 2022-05-18 ENCOUNTER — Other Ambulatory Visit: Payer: Self-pay

## 2022-05-18 ENCOUNTER — Other Ambulatory Visit: Payer: Self-pay | Admitting: Internal Medicine

## 2022-05-22 DIAGNOSIS — J449 Chronic obstructive pulmonary disease, unspecified: Secondary | ICD-10-CM | POA: Diagnosis not present

## 2022-05-22 DIAGNOSIS — I5020 Unspecified systolic (congestive) heart failure: Secondary | ICD-10-CM | POA: Diagnosis not present

## 2022-05-24 DIAGNOSIS — J449 Chronic obstructive pulmonary disease, unspecified: Secondary | ICD-10-CM | POA: Diagnosis not present

## 2022-05-24 DIAGNOSIS — I5020 Unspecified systolic (congestive) heart failure: Secondary | ICD-10-CM | POA: Diagnosis not present

## 2022-05-31 ENCOUNTER — Other Ambulatory Visit: Payer: Self-pay | Admitting: Internal Medicine

## 2022-06-01 DIAGNOSIS — J449 Chronic obstructive pulmonary disease, unspecified: Secondary | ICD-10-CM | POA: Diagnosis not present

## 2022-06-01 DIAGNOSIS — I5020 Unspecified systolic (congestive) heart failure: Secondary | ICD-10-CM | POA: Diagnosis not present

## 2022-06-05 ENCOUNTER — Other Ambulatory Visit: Payer: Self-pay | Admitting: Registered Nurse

## 2022-06-07 ENCOUNTER — Telehealth: Payer: Self-pay | Admitting: Registered Nurse

## 2022-06-07 MED ORDER — GABAPENTIN 600 MG PO TABS: ORAL_TABLET | ORAL | 1 refills | Status: DC

## 2022-06-07 NOTE — Telephone Encounter (Signed)
Call placed to Hannah Scott : Regarding her Gabapentin.  No answer. Left message to return the call.

## 2022-06-07 NOTE — Telephone Encounter (Signed)
Ms. Llera return the call, she's taking her Gabapentin nightly. Gabapentin ordered today, she verbalizes understanding.

## 2022-06-16 ENCOUNTER — Encounter: Payer: Medicare Other | Admitting: Registered Nurse

## 2022-06-20 ENCOUNTER — Encounter: Payer: Self-pay | Admitting: Registered Nurse

## 2022-06-20 ENCOUNTER — Encounter: Payer: Medicare Other | Attending: Registered Nurse | Admitting: Registered Nurse

## 2022-06-20 ENCOUNTER — Other Ambulatory Visit: Payer: Self-pay | Admitting: Internal Medicine

## 2022-06-20 VITALS — BP 140/81 | HR 74 | Ht 60.0 in | Wt 138.0 lb

## 2022-06-20 DIAGNOSIS — M961 Postlaminectomy syndrome, not elsewhere classified: Secondary | ICD-10-CM | POA: Insufficient documentation

## 2022-06-20 DIAGNOSIS — Z5181 Encounter for therapeutic drug level monitoring: Secondary | ICD-10-CM | POA: Diagnosis not present

## 2022-06-20 DIAGNOSIS — M48062 Spinal stenosis, lumbar region with neurogenic claudication: Secondary | ICD-10-CM | POA: Insufficient documentation

## 2022-06-20 DIAGNOSIS — M24551 Contracture, right hip: Secondary | ICD-10-CM | POA: Insufficient documentation

## 2022-06-20 DIAGNOSIS — G894 Chronic pain syndrome: Secondary | ICD-10-CM | POA: Insufficient documentation

## 2022-06-20 DIAGNOSIS — Z79891 Long term (current) use of opiate analgesic: Secondary | ICD-10-CM | POA: Insufficient documentation

## 2022-06-20 MED ORDER — HYDROCODONE-ACETAMINOPHEN 10-325 MG PO TABS: 1.0000 | ORAL_TABLET | Freq: Three times a day (TID) | ORAL | 0 refills | Status: DC | PRN

## 2022-06-20 NOTE — Progress Notes (Signed)
Subjective:    Patient ID: Hannah Scott, female    DOB: [birth year], 72 y.o.   MRN: [id number]  HPI: Hannah Scott is a 72 y.o. female who is here for scheduled telephone visit for  follow up appointment for chronic pain and medication refill. : We have  discussed the limitations of evaluation and management by telemedicine and the availability of in person appointments. The patient expressed understanding and agreed to proceed.  She states her pain is located in her lower back. She rates her pain 6. Her current exercise regime is walking with her walker.   Hannah Scott Morphine equivalent is 30.00 MME.    Last Oral Swab was Performed on 12/17/2021, it was consistent.   Pain Inventory Average Pain 6 Pain Right Now 6 My pain is constant, burning, tingling, and aching  In the last 24 hours, has pain interfered with the following? General activity 10 Relation with others 10 Enjoyment of life 10 What TIME of day is your pain at its worst? morning , daytime, evening, and night Sleep (in general) Poor  Pain is worse with: walking and standing Pain improves with: rest and medication Relief from Meds: 5  Family History  Problem Relation Age of Onset   Heart failure Mother    Pneumonia Mother    Hypertension Mother    Heart disease Mother    Stroke Maternal Grandfather    Hypertension Maternal Grandfather    Heart attack Father    Heart disease Father    Asthma Paternal Uncle        PAT UNCLES   Heart attack Paternal Uncle    Social History   Socioeconomic History   Marital status: Widowed    Spouse name: Not on file   Number of children: Not on file   Years of education: Not on file   Highest education level: Not on file  Occupational History   Not on file  Tobacco Use   Smoking status: Never    Passive exposure: Never   Smokeless tobacco: Never  Vaping Use   Vaping Use: Never used  Substance and Sexual Activity   Alcohol use: No    Alcohol/week: 0.0 standard drinks of  alcohol   Drug use: No   Sexual activity: Not Currently    Partners: Male    Birth control/protection: Surgical  Other Topics Concern   Not on file  Social History Narrative   Widowed in 2015   2 sons 1 lives in the area the other is in close contact    1 grandchild   Never smoker no drug use no alcohol   Social Determinants of Health   Financial Resource Strain: Low Risk  (01/11/2022)   Overall Financial Resource Strain (CARDIA)    Difficulty of Paying Living Expenses: Not hard at all  Food Insecurity: No Food Insecurity (01/11/2022)   Hunger Vital Sign    Worried About Running Out of Food in the Last Year: Never true    [place] in the Last Year: Never true  Transportation Needs: No Transportation Needs (01/11/2022)   PRAPARE - Hydrologist (Medical): No    Lack of Transportation (Non-Medical): No  Physical Activity: Inactive (01/11/2022)   Exercise Vital Sign    Days of Exercise per Week: 0 days    Minutes of Exercise per Session: 0 min  Stress: No Stress Concern Present (01/11/2022)   [place]  Questionnaire    Feeling of Stress : Not at all  Social Connections: Socially Integrated (01/11/2022)   Social Connection and Isolation Panel [NHANES]    Frequency of Communication with Friends and Family: More than three times a week    Frequency of Social Gatherings with Friends and Family: More than three times a week    Attends Religious Services: More than 4 times per year    Active Member of Clubs or Organizations: Yes    Attends Music therapist: More than 4 times per year    Marital Status: Married   Past Surgical History:  Procedure Laterality Date   ANKLE SURGERY Left    ligament   APPENDECTOMY  1987   AT [place] LUMPECTOMY  2003   radiation on right   BREAST LUMPECTOMY WITH RADIOACTIVE SEED LOCALIZATION Left 02/12/2016   Procedure:  LEFT BREAST LUMPECTOMY WITH RADIOACTIVE SEED LOCALIZATION;  Surgeon: Excell Seltzer, MD;  Location: [place];  Service: General;  Laterality: Left;   CARDIOVASCULAR STRESS TEST  09/07/05   Nuclear, was negative   CATARACT EXTRACTION Bilateral 01,03   COLONOSCOPY WITH PROPOFOL N/A 05/04/2021   Procedure: COLONOSCOPY WITH PROPOFOL;  Surgeon: Gatha Mayer, MD;  [facility];  Service: Endoscopy;  Laterality: N/A;   ESOPHAGOGASTRODUODENOSCOPY (EGD) WITH PROPOFOL N/A 05/04/2021   Procedure: ESOPHAGOGASTRODUODENOSCOPY (EGD) WITH PROPOFOL;  Surgeon: Gatha Mayer, MD;  [facility];  Service: Endoscopy;  Laterality: N/A;   OOPHORECTOMY     LSO -RSO   PELVIC LAPAROSCOPY  1989   RSO, LYSIS OF ADHESIONS   POLYPECTOMY  05/04/2021   Procedure: POLYPECTOMY;  Surgeon: Gatha Mayer, MD;  [facility];  Service: Endoscopy;;   TOTAL ABDOMINAL HYSTERECTOMY  1987   LSO, APPENDECTOMY   TOTAL HIP ARTHROPLASTY Right 10/28/2013   dr Lorin Mercy   TOTAL HIP ARTHROPLASTY Right 10/28/2013   Procedure: TOTAL HIP ARTHROPLASTY ANTERIOR APPROACH;  Surgeon: Marybelle Killings, MD;  Location: [place];  Service: Orthopedics;  Laterality: Right;  Right Total Hip Arthroplasty, Direct Anterior Approach   Past Surgical History:  Procedure Laterality Date   ANKLE SURGERY Left    ligament   APPENDECTOMY  1987   AT [facility]   BACK SURGERY     Fusion   BREAST LUMPECTOMY  2003   radiation on right   BREAST LUMPECTOMY WITH RADIOACTIVE SEED LOCALIZATION Left 02/12/2016   Procedure: LEFT BREAST LUMPECTOMY WITH RADIOACTIVE SEED LOCALIZATION;  Surgeon: Excell Seltzer, MD;  Location: [place];  Service: General;  Laterality: Left;   CARDIOVASCULAR STRESS TEST  09/07/05   Nuclear, was negative   CATARACT EXTRACTION Bilateral 01,03   COLONOSCOPY WITH PROPOFOL N/A 05/04/2021   Procedure: COLONOSCOPY WITH PROPOFOL;  Surgeon: Gatha Mayer, MD;  [facility];  Service:  Endoscopy;  Laterality: N/A;   ESOPHAGOGASTRODUODENOSCOPY (EGD) WITH PROPOFOL N/A 05/04/2021   Procedure: ESOPHAGOGASTRODUODENOSCOPY (EGD) WITH PROPOFOL;  Surgeon: Gatha Mayer, MD;  [facility];  Service: Endoscopy;  Laterality: N/A;   OOPHORECTOMY     LSO -RSO   PELVIC LAPAROSCOPY  1989   RSO, LYSIS OF ADHESIONS   POLYPECTOMY  05/04/2021   Procedure: POLYPECTOMY;  Surgeon: Gatha Mayer, MD;  [facility];  Service: Endoscopy;;   TOTAL ABDOMINAL HYSTERECTOMY  1987   LSO, APPENDECTOMY   TOTAL HIP ARTHROPLASTY Right 10/28/2013   dr Lorin Mercy   [place]  Right 10/28/2013   Procedure: TOTAL HIP ARTHROPLASTY ANTERIOR APPROACH;  Surgeon: Marybelle Killings, MD;  Location: [place];  Service: Orthopedics;  Laterality: Right;  Right Total Hip Arthroplasty, Direct Anterior Approach   Past Medical History:  Diagnosis Date   Anemia    hx   Arthritis    Asthma    PFTs, February, 2011, moderate obstructive disease with response to bronchodilators, normal lung volumes, moderate reduction in diffusing capacity   Atrial septal aneurysm    Echo, 2008-not noted on 13 echo   CAD (coronary artery disease)    90% distal LAD in the past  /   nuclear, 2008, no ischemia, ejection fraction 70%   Cancer ([place]) 2002   DUCTAL CIS--S/P LUMPECTOMY, RADIATION AND 6 WEEKS OF TAMOXIFEN   D-dimer, elevated    January, 2014   Depression    Ejection fraction    EF 60%, echo, October, 2008   Elevated CPK    January, 2014   Endometriosis 1989   RIGHT TUBE   Endometriosis 1987   LEFT TUBE/OVARY W FOCAL IN-SITU ENDOMETRIAL ADENOCARCINOMA   GERD (gastroesophageal reflux disease)    occ   H/O hiatal hernia    ?   Hyperlipidemia    Hypertension    Hypothyroidism    Patient has had in the past that she does not need treatment   Kyphoscoliosis    Obstructive airway disease ([place])    Pinched nerve    lower back   Shortness of breath    Echo 2/22: EF 60-65, no RWMA, mild LVH, GR 1  DD, GLS  -24%, normal RVSF, mild MR, trivial AI, borderline dilation of aortic root (39 mm)   UTI (lower urinary tract infection)    BP (!) 140/81   Pulse 74   Ht 5' (1.524 m)   Wt 138 lb (62.6 kg)   BMI 26.95 kg/m   Opioid Risk Score:   Fall Risk Score:  `1  Depression screen [facility] 2/9     05/11/2022    2:39 PM 04/08/2022    1:47 PM 02/08/2022    1:24 PM 01/20/2022   10:27 AM 01/11/2022    2:19 PM 12/17/2021   10:44 AM 11/26/2021    1:23 PM  Depression screen PHQ 2/9  Decreased Interest 1 0 0 0 3 0 3  Down, Depressed, Hopeless 1 0 0 0 3 0 3  PHQ - 2 Score 2 0 0 0 6 0 6  Altered sleeping     3  2  Tired, decreased energy     3  3  Change in appetite     3  3  Feeling bad or failure about yourself      0  0  Trouble concentrating     0  0  Moving slowly or fidgety/restless     0  0  Suicidal thoughts     0  0  PHQ-9 Score     15  14  Difficult doing work/chores       Somewhat difficult     Review of Systems  Musculoskeletal:  Positive for back pain.       Rt shoulder neck b/l knees       Objective:   Physical Exam Vitals and nursing note reviewed.  Musculoskeletal:     Comments: No Physical Exam Performed: Telephone Visit          Assessment & Plan:  1. Lumbar post laminectomy syndrome with severe kyphoscoliosis thoracolumbar  spine, s/p lumbar fusion/  06/20/2022 Continue current medication regimen. Continue  Hydrocodone 10/'325mg'$  one tablet every 8 hours #90. We will continue the opioid monitoring program, this consists of regular clinic visits, examinations, urine drug screen, pill counts as well as use of [place] Controlled Substance Reporting system. A 12 month History has been reviewed on the [place] Controlled Substance Reporting System on 06/20/2022 2. Right Hip OA: S/P Right Hip Replacement 10/28/2013. 06/20/2022 3.Depression: Continue Cymbalta. Has Family Support and Friends.  Counseling with Doristine Bosworth. 06/20/2022 4. Right Greater Trochanteric Tenderness:  No complaints today. Continue to Monitor. Continue with Ice and Heat Therapy. 06/20/2022 5. Poor Appetite/ Frail: No complaints today. Ms. Brosman states PCP following. Continue to monitor. 06/20/2022 6. Chronic Bilateral Knee Pain: No complaints today .Continue HEP as Tolerated. Continue to Monitor. 06/20/2022 7. Chronic Bilateral shoulder pain: No complaints today Continue HEP as tolerated. Continue to Monitor. 06/20/2022   F/U in 1 month Telephone Visit Establish Patient Location of Patient: In Her Home Location of Provider: In the Office Total Time Spent: 10 Minutes

## 2022-06-22 DIAGNOSIS — I5020 Unspecified systolic (congestive) heart failure: Secondary | ICD-10-CM | POA: Diagnosis not present

## 2022-06-22 DIAGNOSIS — J449 Chronic obstructive pulmonary disease, unspecified: Secondary | ICD-10-CM | POA: Diagnosis not present

## 2022-06-23 DIAGNOSIS — J449 Chronic obstructive pulmonary disease, unspecified: Secondary | ICD-10-CM | POA: Diagnosis not present

## 2022-06-23 DIAGNOSIS — I5020 Unspecified systolic (congestive) heart failure: Secondary | ICD-10-CM | POA: Diagnosis not present

## 2022-06-30 DIAGNOSIS — J449 Chronic obstructive pulmonary disease, unspecified: Secondary | ICD-10-CM | POA: Diagnosis not present

## 2022-06-30 DIAGNOSIS — I5020 Unspecified systolic (congestive) heart failure: Secondary | ICD-10-CM | POA: Diagnosis not present

## 2022-07-14 ENCOUNTER — Encounter: Payer: Self-pay | Admitting: Registered Nurse

## 2022-07-14 ENCOUNTER — Encounter: Payer: Medicare Other | Attending: Registered Nurse | Admitting: Registered Nurse

## 2022-07-14 VITALS — BP 121/80 | HR 74 | Ht 60.0 in | Wt 138.2 lb

## 2022-07-14 DIAGNOSIS — M24551 Contracture, right hip: Secondary | ICD-10-CM | POA: Diagnosis not present

## 2022-07-14 DIAGNOSIS — M48062 Spinal stenosis, lumbar region with neurogenic claudication: Secondary | ICD-10-CM | POA: Diagnosis not present

## 2022-07-14 DIAGNOSIS — M25562 Pain in left knee: Secondary | ICD-10-CM | POA: Insufficient documentation

## 2022-07-14 DIAGNOSIS — M25511 Pain in right shoulder: Secondary | ICD-10-CM | POA: Insufficient documentation

## 2022-07-14 DIAGNOSIS — R0902 Hypoxemia: Secondary | ICD-10-CM | POA: Insufficient documentation

## 2022-07-14 DIAGNOSIS — G894 Chronic pain syndrome: Secondary | ICD-10-CM | POA: Diagnosis not present

## 2022-07-14 DIAGNOSIS — Z853 Personal history of malignant neoplasm of breast: Secondary | ICD-10-CM | POA: Diagnosis not present

## 2022-07-14 DIAGNOSIS — M961 Postlaminectomy syndrome, not elsewhere classified: Secondary | ICD-10-CM | POA: Insufficient documentation

## 2022-07-14 DIAGNOSIS — Z79891 Long term (current) use of opiate analgesic: Secondary | ICD-10-CM | POA: Insufficient documentation

## 2022-07-14 DIAGNOSIS — G8929 Other chronic pain: Secondary | ICD-10-CM | POA: Insufficient documentation

## 2022-07-14 DIAGNOSIS — M25561 Pain in right knee: Secondary | ICD-10-CM | POA: Diagnosis not present

## 2022-07-14 DIAGNOSIS — Z1231 Encounter for screening mammogram for malignant neoplasm of breast: Secondary | ICD-10-CM | POA: Diagnosis not present

## 2022-07-14 DIAGNOSIS — Z5181 Encounter for therapeutic drug level monitoring: Secondary | ICD-10-CM | POA: Insufficient documentation

## 2022-07-14 LAB — HM MAMMOGRAPHY

## 2022-07-14 MED ORDER — HYDROCODONE-ACETAMINOPHEN 10-325 MG PO TABS: 1.0000 | ORAL_TABLET | Freq: Three times a day (TID) | ORAL | 0 refills | Status: DC | PRN

## 2022-07-14 NOTE — Progress Notes (Signed)
Subjective:    Patient ID: Hannah Scott, female    DOB: 02/12/1951, 72 y.o.   MRN: [id number]  [id number] Hannah Scott is a 72 y.o. female who returns for follow up appointment for chronic pain and medication refill. She states her  pain is located in her right shoulder, lower back pain, right hip and bilateral knee pain. She rates her pain 7. Her current exercise regime is walking short distances with her walker.   Hannah Scott arrive with oxygen desaturation, O2 saturation re-checked. She refused ED or Urgent Care evaluation. She states she has oxygen at home, she will check her oxygen saturation and F/U with her PCP.   Hannah Scott Morphine equivalent is 30.00  MME.   Oral Swab was Performed today.     Pain Inventory Average Pain 7 Pain Right Now 7 My pain is constant, burning, tingling, and aching  In the last 24 hours, has pain interfered with the following? General activity 10 Relation with others 10 Enjoyment of life 10 What TIME of day is your pain at its worst? morning , daytime, evening, and night Sleep (in general) Poor  Pain is worse with: walking, standing, and some activites Pain improves with: rest and medication Relief from Meds: 6  Family History  Problem Relation Age of Onset   Heart failure Mother    Pneumonia Mother    Hypertension Mother    Heart disease Mother    Stroke Maternal Grandfather    Hypertension Maternal Grandfather    Heart attack Father    Heart disease Father    Asthma Paternal Uncle        PAT UNCLES   Heart attack Paternal Uncle    Social History   Socioeconomic History   Marital status: Widowed    Spouse name: Not on file   Number of children: Not on file   Years of education: Not on file   Highest education level: Not on file  Occupational History   Not on file  Tobacco Use   Smoking status: Never    Passive exposure: Never   Smokeless tobacco: Never  Vaping Use   Vaping Use: Never used  Substance and Sexual Activity   Alcohol use: No     Alcohol/week: 0.0 standard drinks of alcohol   Drug use: No   Sexual activity: Not Currently    Partners: Male    Birth control/protection: Surgical  Other Topics Concern   Not on file  Social History Narrative   Widowed in 2015   2 sons 1 lives in the area the other is in close contact    1 grandchild   Never smoker no drug use no alcohol   Social Determinants of Health   Financial Resource Strain: Low Risk  (01/11/2022)   Overall Financial Resource Strain (CARDIA)    Difficulty of Paying Living Expenses: Not hard at all  Food Insecurity: No Food Insecurity (01/11/2022)   Hunger Vital Sign    Worried About Running Out of Food in the Last Year: Never true    [place] in the Last Year: Never true  Transportation Needs: No Transportation Needs (01/11/2022)   PRAPARE - Hydrologist (Medical): No    Lack of Transportation (Non-Medical): No  Physical Activity: Inactive (01/11/2022)   Exercise Vital Sign    Days of Exercise per Week: 0 days    Minutes of Exercise per Session: 0 min  Stress: No Stress Concern Present (  01/11/2022)   [employer] [place]    Feeling of Stress : Not at all  Social Connections: Socially Integrated (01/11/2022)   Social Connection and Isolation Panel [NHANES]    Frequency of Communication with Friends and Family: More than three times a week    Frequency of Social Gatherings with Friends and Family: More than three times a week    Attends Religious Services: More than 4 times per year    Active Member of Clubs or Organizations: Yes    Attends Music therapist: More than 4 times per year    Marital Status: Married   Past Surgical History:  Procedure Laterality Date   ANKLE SURGERY Left    ligament   APPENDECTOMY  1987   AT [place] LUMPECTOMY  2003   radiation on right   BREAST LUMPECTOMY WITH RADIOACTIVE SEED  LOCALIZATION Left 02/12/2016   Procedure: LEFT BREAST LUMPECTOMY WITH RADIOACTIVE SEED LOCALIZATION;  Surgeon: Excell Seltzer, MD;  [facility];  Service: General;  Laterality: Left;   CARDIOVASCULAR STRESS TEST  09/07/05   Nuclear, was negative   CATARACT EXTRACTION Bilateral 01,03   COLONOSCOPY WITH PROPOFOL N/A 05/04/2021   Procedure: COLONOSCOPY WITH PROPOFOL;  Surgeon: Gatha Mayer, MD;  [facility];  Service: Endoscopy;  Laterality: N/A;   ESOPHAGOGASTRODUODENOSCOPY (EGD) WITH PROPOFOL N/A 05/04/2021   Procedure: ESOPHAGOGASTRODUODENOSCOPY (EGD) WITH PROPOFOL;  Surgeon: Gatha Mayer, MD;  [facility];  Service: Endoscopy;  Laterality: N/A;   OOPHORECTOMY     LSO -RSO   PELVIC LAPAROSCOPY  1989   RSO, LYSIS OF ADHESIONS   POLYPECTOMY  05/04/2021   Procedure: POLYPECTOMY;  Surgeon: Gatha Mayer, MD;  [facility];  Service: Endoscopy;;   TOTAL ABDOMINAL HYSTERECTOMY  1987   [facility], APPENDECTOMY   TOTAL HIP ARTHROPLASTY Right 10/28/2013   dr Lorin Mercy   TOTAL HIP ARTHROPLASTY Right 10/28/2013   Procedure: TOTAL HIP ARTHROPLASTY ANTERIOR APPROACH;  Surgeon: Marybelle Killings, MD;  Location: [place];  Service: Orthopedics;  Laterality: Right;  Right Total Hip Arthroplasty, Direct Anterior Approach   Past Surgical History:  Procedure Laterality Date   ANKLE SURGERY Left    ligament   APPENDECTOMY  1987   AT [facility]   BACK SURGERY     Fusion   BREAST LUMPECTOMY  2003   radiation on right   BREAST LUMPECTOMY WITH RADIOACTIVE SEED LOCALIZATION Left 02/12/2016   Procedure: LEFT BREAST LUMPECTOMY WITH RADIOACTIVE SEED LOCALIZATION;  Surgeon: Excell Seltzer, MD;  Location: [place];  Service: General;  Laterality: Left;   CARDIOVASCULAR STRESS TEST  09/07/05   Nuclear, was negative   CATARACT EXTRACTION Bilateral 01,03   COLONOSCOPY WITH PROPOFOL N/A 05/04/2021   Procedure: COLONOSCOPY WITH PROPOFOL;  Surgeon: Gatha Mayer, MD;  [facility];  Service: Endoscopy;  Laterality: N/A;   ESOPHAGOGASTRODUODENOSCOPY (EGD) WITH PROPOFOL N/A 05/04/2021   Procedure: ESOPHAGOGASTRODUODENOSCOPY (EGD) WITH PROPOFOL;  Surgeon: Gatha Mayer, MD;  [facility];  Service: Endoscopy;  Laterality: N/A;   OOPHORECTOMY     LSO -RSO   PELVIC LAPAROSCOPY  1989   RSO, LYSIS OF ADHESIONS   POLYPECTOMY  05/04/2021   Procedure: POLYPECTOMY;  Surgeon: Gatha Mayer, MD;  [facility];  Service: Endoscopy;;   TOTAL ABDOMINAL HYSTERECTOMY  1987   LSO, APPENDECTOMY   TOTAL HIP ARTHROPLASTY  Right 10/28/2013   dr Lorin Mercy   TOTAL HIP ARTHROPLASTY Right 10/28/2013   Procedure: TOTAL HIP ARTHROPLASTY ANTERIOR APPROACH;  Surgeon: Marybelle Killings, MD;  Location: [place];  Service: Orthopedics;  Laterality: Right;  Right Total Hip Arthroplasty, Direct Anterior Approach   Past Medical History:  Diagnosis Date   Anemia    hx   Arthritis    Asthma    PFTs, February, 2011, moderate obstructive disease with response to bronchodilators, normal lung volumes, moderate reduction in diffusing capacity   Atrial septal aneurysm    Echo, 2008-not noted on 13 echo   CAD (coronary artery disease)    90% distal LAD in the past  /   nuclear, 2008, no ischemia, ejection fraction 70%   Cancer ([place]) 2002   DUCTAL CIS--S/P LUMPECTOMY, RADIATION AND 6 WEEKS OF TAMOXIFEN   D-dimer, elevated    January, 2014   Depression    Ejection fraction    EF 60%, echo, October, 2008   Elevated CPK    January, 2014   Endometriosis 1989   RIGHT TUBE   Endometriosis 1987   LEFT TUBE/OVARY W FOCAL IN-SITU ENDOMETRIAL ADENOCARCINOMA   GERD (gastroesophageal reflux disease)    occ   H/O hiatal hernia    ?   Hyperlipidemia    Hypertension    Hypothyroidism    Patient has had in the past that she does not need treatment   Kyphoscoliosis    Obstructive airway disease ([place])    Pinched nerve    lower back   Shortness of breath    Echo 2/22:  EF 60-65, no RWMA, mild LVH, GR 1  DD, GLS -24%, normal RVSF, mild MR, trivial AI, borderline dilation of aortic root (39 mm)   UTI (lower urinary tract infection)    BP 121/80   Pulse 74   Ht 5' (1.524 m)   Wt 138 lb 3.2 oz (62.7 kg)   SpO2 (!) 89%   BMI 26.99 kg/m   Opioid Risk Score:   Fall Risk Score:  `1  Depression screen [facility] 2/9     06/20/2022    4:17 PM 05/11/2022    2:39 PM 04/08/2022    1:47 PM 02/08/2022    1:24 PM 01/20/2022   10:27 AM 01/11/2022    2:19 PM 12/17/2021   10:44 AM  Depression screen PHQ 2/9  Decreased Interest 0 1 0 0 0 3 0  Down, Depressed, Hopeless 0 1 0 0 0 3 0  PHQ - 2 Score 0 2 0 0 0 6 0  Altered sleeping      3   Tired, decreased energy      3   Change in appetite      3   Feeling bad or failure about yourself       0   Trouble concentrating      0   Moving slowly or fidgety/restless      0   Suicidal thoughts      0   PHQ-9 Score      15     Review of Systems  Constitutional: Negative.   HENT: Negative.    Eyes: Negative.   Respiratory: Negative.    Cardiovascular: Negative.   Gastrointestinal: Negative.   Endocrine: Negative.   Genitourinary: Negative.   Musculoskeletal:  Positive for back pain, gait problem and neck pain.  Skin: Negative.   Allergic/Immunologic: Negative.   Hematological: Negative.   Psychiatric/Behavioral: Negative.    All  other systems reviewed and are negative.      Objective:   Physical Exam Vitals and nursing note reviewed.  Constitutional:      Appearance: Normal appearance.  Cardiovascular:     Rate and Rhythm: Normal rate and regular rhythm.     Pulses: Normal pulses.     Heart sounds: Normal heart sounds.  Pulmonary:     Effort: Pulmonary effort is normal.     Breath sounds: Normal breath sounds.  Musculoskeletal:     Cervical back: Normal range of motion and neck supple.     Comments: Normal Muscle Bulk and Muscle Testing Reveals:  Upper Extremities: Right: Decreased ROM 90 Degrees and  Muscle Strength 5/5 Right AC Joint Tenderness Left Upper Extremity: Full ROM and Muscle Strength 5/5 Lumbar Paraspinal Tenderness: L-4-L-5 Lower Extremities: Full ROM and Muscle Strength 5/5 Arises from Table slowly using walker for support Antalgic  Gait     Skin:    General: Skin is warm and dry.  Neurological:     Mental Status: She is alert and oriented to person, place, and time.  Psychiatric:        Mood and Affect: Mood normal.        Behavior: Behavior normal.         Assessment & Plan:  1. Lumbar post laminectomy syndrome with severe kyphoscoliosis thoracolumbar spine, s/p lumbar fusion/  07/14/2022 Continue current medication regimen. Continue  Hydrocodone 10/325mg  one tablet every 8 hours #90. We will continue the opioid monitoring program, this consists of regular clinic visits, examinations, urine drug screen, pill counts as well as use of [place] Controlled Substance Reporting system. A 12 month History has been reviewed on the [place] Controlled Substance Reporting System on 07/14/2022 2. Right Hip OA: S/P Right Hip Replacement 10/28/2013. 07/14/2022 3.Depression: Continue Cymbalta. Has Family Support and Friends.  Counseling with Doristine Bosworth. 07/14/2022 4. Right Greater Trochanteric Tenderness: No complaints today. Continue to Monitor. Continue with Ice and Heat Therapy. 07/14/2022 5. Poor Appetite/ Frail: No complaints today. Ms. Mcdaniels states PCP following. Continue to monitor. 07/14/2022 6. Chronic Bilateral Knee Pain: No complaints today .Continue HEP as Tolerated. Continue to Monitor. 07/14/2022 7. Chronic Right  shoulder pain:  Continue HEP as tolerated. Continue to Monitor. 07/14/2022 8. Oxygen Desaturation: O2 sat was re-checked. Ms. Midura refuses ED or Urgent Care evaluation. She states she has oxygen at home and will re-check her oxygen saturation when she gets home and will F/U with her PCP. We will continue to monitor.   F/U in 1 month

## 2022-07-19 LAB — DRUG TOX MONITOR 1 W/CONF, ORAL FLD
Amphetamines: NEGATIVE ng/mL (ref <10)
Barbiturates: NEGATIVE ng/mL (ref <10)
Benzodiazepines: NEGATIVE ng/mL (ref <0.50)
Buprenorphine: NEGATIVE ng/mL (ref <0.10)
Cocaine: NEGATIVE ng/mL (ref <5.0)
Codeine: NEGATIVE ng/mL (ref <2.5)
Dihydrocodeine: 27.6 ng/mL — ABNORMAL HIGH (ref <2.5)
Fentanyl: NEGATIVE ng/mL (ref <0.10)
Heroin Metabolite: NEGATIVE ng/mL (ref <1.0)
Hydrocodone: 151.5 ng/mL — ABNORMAL HIGH (ref <2.5)
Hydromorphone: NEGATIVE ng/mL (ref <2.5)
MARIJUANA: NEGATIVE ng/mL (ref <2.5)
MDMA: NEGATIVE ng/mL (ref <10)
Meprobamate: NEGATIVE ng/mL (ref <2.5)
Methadone: NEGATIVE ng/mL (ref <5.0)
Morphine: NEGATIVE ng/mL (ref <2.5)
Nicotine Metabolite: NEGATIVE ng/mL (ref <5.0)
Norhydrocodone: 4.3 ng/mL — ABNORMAL HIGH (ref <2.5)
Noroxycodone: NEGATIVE ng/mL (ref <2.5)
Opiates: POSITIVE ng/mL — AB (ref <2.5)
Oxycodone: NEGATIVE ng/mL (ref <2.5)
Oxymorphone: NEGATIVE ng/mL (ref <2.5)
Phencyclidine: NEGATIVE ng/mL (ref <10)
Tapentadol: NEGATIVE ng/mL (ref <5.0)
Tramadol: NEGATIVE ng/mL (ref <5.0)
Zolpidem: NEGATIVE ng/mL (ref <5.0)

## 2022-07-19 LAB — DRUG TOX ALC METAB W/CON, ORAL FLD: Alcohol Metabolite: NEGATIVE ng/mL (ref <25)

## 2022-07-21 ENCOUNTER — Ambulatory Visit: Payer: Medicare Other | Admitting: Internal Medicine

## 2022-07-21 DIAGNOSIS — J449 Chronic obstructive pulmonary disease, unspecified: Secondary | ICD-10-CM | POA: Diagnosis not present

## 2022-07-21 DIAGNOSIS — I5020 Unspecified systolic (congestive) heart failure: Secondary | ICD-10-CM | POA: Diagnosis not present

## 2022-07-23 DIAGNOSIS — J449 Chronic obstructive pulmonary disease, unspecified: Secondary | ICD-10-CM | POA: Diagnosis not present

## 2022-07-23 DIAGNOSIS — I5020 Unspecified systolic (congestive) heart failure: Secondary | ICD-10-CM | POA: Diagnosis not present

## 2022-07-25 ENCOUNTER — Encounter: Payer: Self-pay | Admitting: Internal Medicine

## 2022-07-25 ENCOUNTER — Ambulatory Visit (INDEPENDENT_AMBULATORY_CARE_PROVIDER_SITE_OTHER): Payer: Medicare Other | Admitting: Internal Medicine

## 2022-07-25 VITALS — BP 122/86 | HR 82 | Temp 98.1°F | Ht 60.0 in | Wt 141.0 lb

## 2022-07-25 DIAGNOSIS — E60 Dietary zinc deficiency: Secondary | ICD-10-CM | POA: Diagnosis not present

## 2022-07-25 DIAGNOSIS — E559 Vitamin D deficiency, unspecified: Secondary | ICD-10-CM | POA: Diagnosis not present

## 2022-07-25 DIAGNOSIS — E7849 Other hyperlipidemia: Secondary | ICD-10-CM

## 2022-07-25 DIAGNOSIS — E039 Hypothyroidism, unspecified: Secondary | ICD-10-CM | POA: Diagnosis not present

## 2022-07-25 DIAGNOSIS — E119 Type 2 diabetes mellitus without complications: Secondary | ICD-10-CM

## 2022-07-25 DIAGNOSIS — I10 Essential (primary) hypertension: Secondary | ICD-10-CM

## 2022-07-25 DIAGNOSIS — E538 Deficiency of other specified B group vitamins: Secondary | ICD-10-CM | POA: Diagnosis not present

## 2022-07-25 DIAGNOSIS — I5032 Chronic diastolic (congestive) heart failure: Secondary | ICD-10-CM

## 2022-07-25 DIAGNOSIS — F3289 Other specified depressive episodes: Secondary | ICD-10-CM

## 2022-07-25 DIAGNOSIS — N1832 Chronic kidney disease, stage 3b: Secondary | ICD-10-CM

## 2022-07-25 LAB — LIPID PANEL
Cholesterol: 123 mg/dL (ref 0–200)
HDL: 58.6 mg/dL (ref >39.00)
LDL Cholesterol: 54 mg/dL (ref 0–99)
NonHDL: 64.8
Total CHOL/HDL Ratio: 2
Triglycerides: 56 mg/dL (ref 0.0–149.0)
VLDL: 11.2 mg/dL (ref 0.0–40.0)

## 2022-07-25 LAB — COMPREHENSIVE METABOLIC PANEL
ALT: 19 U/L (ref 0–35)
AST: 30 U/L (ref 0–37)
Albumin: 4.1 g/dL (ref 3.5–5.2)
Alkaline Phosphatase: 109 U/L (ref 39–117)
BUN: 20 mg/dL (ref 6–23)
CO2: 36 mEq/L — ABNORMAL HIGH (ref 19–32)
Calcium: 9.6 mg/dL (ref 8.4–10.5)
Chloride: 100 mEq/L (ref 96–112)
Creatinine, Ser: 0.98 mg/dL (ref 0.40–1.20)
GFR: 57.48 mL/min — ABNORMAL LOW (ref >60.00)
Glucose, Bld: 94 mg/dL (ref 70–99)
Potassium: 4.6 mEq/L (ref 3.5–5.1)
Sodium: 138 mEq/L (ref 135–145)
Total Bilirubin: 0.9 mg/dL (ref 0.2–1.2)
Total Protein: 6.4 g/dL (ref 6.0–8.3)

## 2022-07-25 LAB — CBC WITH DIFFERENTIAL/PLATELET
Basophils Absolute: 0 10*3/uL (ref 0.0–0.1)
Basophils Relative: 0.6 % (ref 0.0–3.0)
Eosinophils Absolute: 0.4 10*3/uL (ref 0.0–0.7)
Eosinophils Relative: 9 % — ABNORMAL HIGH (ref 0.0–5.0)
HCT: 43 % (ref 36.0–46.0)
Hemoglobin: 13.9 g/dL (ref 12.0–15.0)
Lymphocytes Relative: 25.3 % (ref 12.0–46.0)
Lymphs Abs: 1.1 10*3/uL (ref 0.7–4.0)
MCHC: 32.3 g/dL (ref 30.0–36.0)
MCV: 89.3 fl (ref 78.0–100.0)
Monocytes Absolute: 0.4 10*3/uL (ref 0.1–1.0)
Monocytes Relative: 9.5 % (ref 3.0–12.0)
Neutro Abs: 2.5 10*3/uL (ref 1.4–7.7)
Neutrophils Relative %: 55.6 % (ref 43.0–77.0)
Platelets: 187 10*3/uL (ref 150.0–400.0)
RBC: 4.82 Mil/uL (ref 3.87–5.11)
RDW: 15.7 % — ABNORMAL HIGH (ref 11.5–15.5)
WBC: 4.5 10*3/uL (ref 4.0–10.5)

## 2022-07-25 LAB — TSH: TSH: 0.71 u[IU]/mL (ref 0.35–5.50)

## 2022-07-25 LAB — VITAMIN D 25 HYDROXY (VIT D DEFICIENCY, FRACTURES): VITD: 34.61 ng/mL (ref 30.00–100.00)

## 2022-07-25 LAB — BRAIN NATRIURETIC PEPTIDE: Pro B Natriuretic peptide (BNP): 780 pg/mL — ABNORMAL HIGH (ref 0.0–100.0)

## 2022-07-25 LAB — HEMOGLOBIN A1C: Hgb A1c MFr Bld: 6 % (ref 4.6–6.5)

## 2022-07-25 MED ORDER — TRAZODONE HCL 50 MG PO TABS: 25.0000 mg | ORAL_TABLET | Freq: Every day | ORAL | 5 refills | Status: DC

## 2022-07-25 MED ORDER — CYANOCOBALAMIN 1000 MCG/ML IJ SOLN
1000.0000 ug | Freq: Once | INTRAMUSCULAR | Status: AC
Start: 2022-07-25 — End: 2022-07-25
  Administered 2022-07-25: 1000 ug via INTRAMUSCULAR

## 2022-07-25 NOTE — Assessment & Plan Note (Signed)
Chronic Has been stable CBC, CMP

## 2022-07-25 NOTE — Progress Notes (Signed)
Subjective:    Patient ID: Hannah Scott, female    DOB: [birth year], 72 y.o.   MRN: [id number]      HPI Hannah Scott is here for  Chief Complaint  Patient presents with   Fatigue    Wants to have Thyroid checked   Will also do routine f/u of chronic medical problems.    Last tsh 0.94 01/20/22.    Having increased fatigue.  She sleeps about 4 hrs of sleep at night.   Has difficulty falling asleep and probably staying asleep.  She is concerned about the worsening fatigue.     Medications and allergies reviewed with patient and updated if appropriate.  Current Outpatient Medications on File Prior to Visit  Medication Sig Dispense Refill   albuterol (VENTOLIN HFA) 108 (90 Base) MCG/ACT inhaler Inhale 2 puffs into the lungs every 4 (four) hours as needed for wheezing or shortness of breath. 1 each 11   aspirin EC 81 MG tablet Take 1 tablet (81 mg total) by mouth daily. Swallow whole. 90 tablet 3   atorvastatin (LIPITOR) 40 MG tablet Take 1 tablet (40 mg total) by mouth daily. 90 tablet 2   bisoprolol (ZEBETA) 5 MG tablet TAKE 1 TABLET BY MOUTH EVERY DAY 90 tablet 1   BREO ELLIPTA 100-25 MCG/ACT AEPB INHALE 1 PUFF INTO THE LUNGS DAILY 60 each 11   clobetasol cream (TEMOVATE) [id number] % Apply 1 Application topically 2 (two) times daily. 30 g 2   DULoxetine (CYMBALTA) 60 MG capsule TAKE ONE CAPSULE BY MOUTH EVERY DAY TAKE ALONG WITH 30MG  CAPSULE. TOTAL DAILY DOSE is 90MG . 30 capsule 1   famotidine (PEPCID) 20 MG tablet One after supper 90 tablet 2   gabapentin (NEURONTIN) 600 MG tablet TAKE ONE TABLET BY MOUTH EVERY DAY AT BEDTIME 90 tablet 1   HYDROcodone-acetaminophen (NORCO) 10-325 MG tablet Take 1 tablet by mouth 3 (three) times daily as needed. 90 tablet 0   ipratropium-albuterol (DUONEB) 0.5-2.5 (3) MG/3ML SOLN Take 3 mLs by nebulization every 4 (four) hours as needed (wheezing or SOB). During exacerbation 360 mL 1   irbesartan (AVAPRO) 75 MG tablet TAKE 1 TABLET(75 MG) BY MOUTH DAILY 90  tablet 1   levothyroxine (SYNTHROID) 100 MCG tablet Take 1 tablet (100 mcg total) by mouth daily. 90 tablet 3   torsemide (DEMADEX) 10 MG tablet TAKE ONE TABLET BY MOUTH EVERY DAY 90 tablet 0   No current facility-administered medications on file prior to visit.    Review of Systems  Constitutional:  Positive for appetite change (not great) and fatigue. Negative for fever.  Respiratory:  Positive for cough (productive in am of discolored mucus), shortness of breath and wheezing.   Cardiovascular:  Negative for chest pain, palpitations and leg swelling.  Neurological:  Positive for light-headedness (occ) and headaches (occ).       Objective:   Vitals:   07/25/22 0836  BP: 122/86  Pulse: 82  Temp: 98.1 F (36.7 C)  SpO2: 90%   BP Readings from Last 3 Encounters:  07/25/22 122/86  07/14/22 121/80  06/20/22 (!) 140/81   Wt Readings from Last 3 Encounters:  07/25/22 141 lb (64 kg)  07/14/22 138 lb 3.2 oz (62.7 kg)  06/20/22 138 lb (62.6 kg)   Body mass index is 27.54 kg/m.    Physical Exam Constitutional:      General: She is not in acute distress.    Appearance: Normal appearance.  HENT:     Head: Normocephalic  and atraumatic.  Eyes:     Conjunctiva/sclera: Conjunctivae normal.  Cardiovascular:     Rate and Rhythm: Normal rate and regular rhythm.     Heart sounds: Normal heart sounds.  Pulmonary:     Effort: Pulmonary effort is normal. No respiratory distress.     Breath sounds: Rales (mild at  base of LLL) present. No wheezing.  Musculoskeletal:     Cervical back: Neck supple.     Right lower leg: No edema.     Left lower leg: No edema.  Lymphadenopathy:     Cervical: No cervical adenopathy.  Skin:    General: Skin is warm and dry.     Findings: No rash.  Neurological:     Mental Status: She is alert. Mental status is at baseline.  Psychiatric:        Mood and Affect: Mood normal.        Behavior: Behavior normal.            Assessment & Plan:     See Problem List for Assessment and Plan of chronic medical problems.

## 2022-07-25 NOTE — Assessment & Plan Note (Signed)
Chronic Completed high dose vitamin d Check vitamin d level

## 2022-07-25 NOTE — Assessment & Plan Note (Signed)
History of zinc deficiency Check zinc level

## 2022-07-25 NOTE — Assessment & Plan Note (Signed)
Chronic ?Check B12 level ?

## 2022-07-25 NOTE — Assessment & Plan Note (Signed)
Chronic ?  Controlled or not She is extremely fatigued and that is making it difficult to know Will work on sleep and hopefully that will improve her overall mood For now we will decrease duloxetine to 60 mg daily so that we can add in trazodone If sleep improves and there is still some depression may consider switching duloxetine to a different medication

## 2022-07-25 NOTE — Assessment & Plan Note (Addendum)
Chronic  Experiencing increased fatigue-concern for hypothyroid Check tsh and will titrate med dose if needed Currently taking levothyroxine 100 mcg daily

## 2022-07-25 NOTE — Assessment & Plan Note (Signed)
Has had some increase in weight above normal, no leg swelling Lungs with possible crackles in LLL - mild Check BNP Continue torsemide 10 mg daily

## 2022-07-25 NOTE — Assessment & Plan Note (Signed)
Chronic Check lipid panel  Continue atorvastatin 20 mg daily   

## 2022-07-25 NOTE — Assessment & Plan Note (Signed)
Chronic Blood pressure well controlled CMP Continue bisoprolol 5 mg daily, torsemide 10 mg daily, irbesartan 75 mg daily 

## 2022-07-25 NOTE — Patient Instructions (Addendum)
     B12 injection today   Blood work was ordered.   The lab is on the first floor.    Medications changes include :  start otc vitamin D3 2000 units daily.   Decrease duloxetine to 60 mg daily.  Start trazodone 25-50 mg at bedtime.        Return in about 6 months (around 01/24/2023) for Physical Exam.

## 2022-07-25 NOTE — Assessment & Plan Note (Signed)
Chronic   Lab Results  Component Value Date   HGBA1C 6.0 07/25/2022   Sugars controlled Check A1c Continue lifestyle control

## 2022-07-26 ENCOUNTER — Telehealth: Payer: Self-pay | Admitting: *Deleted

## 2022-07-26 NOTE — Telephone Encounter (Signed)
Oral swab drug screen was consistent for prescribed medications.  ?

## 2022-07-29 ENCOUNTER — Telehealth: Payer: Self-pay

## 2022-07-30 LAB — ZINC: Zinc: 64 ug/dL (ref 60–130)

## 2022-07-31 DIAGNOSIS — J449 Chronic obstructive pulmonary disease, unspecified: Secondary | ICD-10-CM | POA: Diagnosis not present

## 2022-07-31 DIAGNOSIS — I5020 Unspecified systolic (congestive) heart failure: Secondary | ICD-10-CM | POA: Diagnosis not present

## 2022-08-15 ENCOUNTER — Encounter: Payer: Medicare Other | Attending: Registered Nurse | Admitting: Registered Nurse

## 2022-08-15 ENCOUNTER — Encounter: Payer: Self-pay | Admitting: Registered Nurse

## 2022-08-15 ENCOUNTER — Other Ambulatory Visit: Payer: Self-pay | Admitting: Internal Medicine

## 2022-08-15 VITALS — BP 142/88 | HR 82 | Ht 60.0 in | Wt 138.0 lb

## 2022-08-15 DIAGNOSIS — Z5181 Encounter for therapeutic drug level monitoring: Secondary | ICD-10-CM | POA: Diagnosis not present

## 2022-08-15 DIAGNOSIS — M24551 Contracture, right hip: Secondary | ICD-10-CM | POA: Diagnosis not present

## 2022-08-15 DIAGNOSIS — I10 Essential (primary) hypertension: Secondary | ICD-10-CM

## 2022-08-15 DIAGNOSIS — G894 Chronic pain syndrome: Secondary | ICD-10-CM

## 2022-08-15 DIAGNOSIS — Z79891 Long term (current) use of opiate analgesic: Secondary | ICD-10-CM

## 2022-08-15 DIAGNOSIS — M48062 Spinal stenosis, lumbar region with neurogenic claudication: Secondary | ICD-10-CM | POA: Diagnosis not present

## 2022-08-15 DIAGNOSIS — I517 Cardiomegaly: Secondary | ICD-10-CM

## 2022-08-15 MED ORDER — HYDROCODONE-ACETAMINOPHEN 10-325 MG PO TABS: 1.0000 | ORAL_TABLET | Freq: Three times a day (TID) | ORAL | 0 refills | Status: DC | PRN

## 2022-08-15 MED ORDER — TORSEMIDE 10 MG PO TABS
10.0000 mg | ORAL_TABLET | Freq: Every day | ORAL | 0 refills | Status: DC
Start: 2022-08-15 — End: 2022-11-07

## 2022-08-15 NOTE — Progress Notes (Signed)
Subjective:    Patient ID: Hannah Scott, female    DOB: 07-Jan-1951, 72 y.o.   MRN: [id number]  HPI: Hannah Scott is a 73 y.o. female who is scheduled for Telephone visit,we have discussed the limitations of evaluation and management by telemedicine and the availability of in person appointments. The patient expressed understanding and agreed to proceed. She states her pain is located in her lower back. She rates her  pain 6. Her current exercise regime is walking shorting distances with walker.   [name] Morphine equivalent is 30.00 MME.   Last oral swab was performed on 07/14/2022, it was consistent.    Pain Inventory Average Pain 7 Pain Right Now 6 My pain is intermittent, constant, burning, tingling, and aching  In the last 24 hours, has pain interfered with the following? General activity 10 Relation with others 10 Enjoyment of life 10 What TIME of day is your pain at its worst? morning , daytime, evening, and night Sleep (in general) Poor  Pain is worse with: walking and standing Pain improves with: rest and medication Relief from Meds: 6  Family History  Problem Relation Age of Onset   Heart failure Mother    Pneumonia Mother    Hypertension Mother    Heart disease Mother    Stroke Maternal Grandfather    Hypertension Maternal Grandfather    Heart attack Father    Heart disease Father    Asthma Paternal Uncle        PAT UNCLES   Heart attack Paternal Uncle    Social History   Socioeconomic History   Marital status: Widowed    Spouse name: Not on file   Number of children: Not on file   Years of education: Not on file   Highest education level: Not on file  Occupational History   Not on file  Tobacco Use   Smoking status: Never    Passive exposure: Never   Smokeless tobacco: Never  Vaping Use   Vaping Use: Never used  Substance and Sexual Activity   Alcohol use: No    Alcohol/week: 0.0 standard drinks of alcohol   Drug use: No   Sexual activity: Not  Currently    Partners: Male    Birth control/protection: Surgical  Other Topics Concern   Not on file  Social History Narrative   Widowed in 2015   2 sons 1 lives in the area the other is in close contact    1 grandchild   Never smoker no drug use no alcohol   Social Determinants of Health   Financial Resource Strain: Low Risk  (01/11/2022)   Overall Financial Resource Strain (CARDIA)    Difficulty of Paying Living Expenses: Not hard at all  Food Insecurity: No Food Insecurity (01/11/2022)   Hunger Vital Sign    Worried About Running Out of Food in the Last Year: Never true    Ran Out of Food in the Last Year: Never true  Transportation Needs: No Transportation Needs (01/11/2022)   PRAPARE - Administrator, Civil Service (Medical): No    Lack of Transportation (Non-Medical): No  Physical Activity: Inactive (01/11/2022)   Exercise Vital Sign    Days of Exercise per Week: 0 days    Minutes of Exercise per Session: 0 min  Stress: No Stress Concern Present (01/11/2022)   [employer] of Occupational Health - Occupational Stress Questionnaire    Feeling of Stress : Not at all  Social  Connections: Socially Integrated (01/11/2022)   Social Connection and Isolation Panel [NHANES]    Frequency of Communication with Friends and Family: More than three times a week    Frequency of Social Gatherings with Friends and Family: More than three times a week    Attends Religious Services: More than 4 times per year    Active Member of Clubs or Organizations: Yes    Attends Music therapist: More than 4 times per year    Marital Status: Married   Past Surgical History:  Procedure Laterality Date   ANKLE SURGERY Left    ligament   APPENDECTOMY  1987   AT [place] LUMPECTOMY  2003   radiation on right   BREAST LUMPECTOMY WITH RADIOACTIVE SEED LOCALIZATION Left 02/12/2016   Procedure: LEFT BREAST LUMPECTOMY WITH RADIOACTIVE SEED  LOCALIZATION;  Surgeon: Excell Seltzer, MD;  Location: [facility] [facility];  Service: General;  Laterality: Left;   CARDIOVASCULAR STRESS TEST  09/07/05   Nuclear, was negative   CATARACT EXTRACTION Bilateral 01,03   COLONOSCOPY WITH PROPOFOL N/A 05/04/2021   Procedure: COLONOSCOPY WITH PROPOFOL;  Surgeon: Gatha Mayer, MD;  Location: [facility];  Service: Endoscopy;  Laterality: N/A;   ESOPHAGOGASTRODUODENOSCOPY (EGD) WITH PROPOFOL N/A 05/04/2021   Procedure: ESOPHAGOGASTRODUODENOSCOPY (EGD) WITH PROPOFOL;  Surgeon: Gatha Mayer, MD;  Location: [facility];  Service: Endoscopy;  Laterality: N/A;   OOPHORECTOMY     LSO -RSO   PELVIC LAPAROSCOPY  1989   RSO, LYSIS OF ADHESIONS   POLYPECTOMY  05/04/2021   Procedure: POLYPECTOMY;  Surgeon: Gatha Mayer, MD;  Location: [facility];  Service: Endoscopy;;   TOTAL ABDOMINAL HYSTERECTOMY  1987   LSO, APPENDECTOMY   TOTAL HIP ARTHROPLASTY Right 10/28/2013   dr Lorin Mercy   TOTAL HIP ARTHROPLASTY Right 10/28/2013   Procedure: TOTAL HIP ARTHROPLASTY ANTERIOR APPROACH;  Surgeon: Marybelle Killings, MD;  Location: [facility];  Service: Orthopedics;  Laterality: Right;  Right Total Hip Arthroplasty, Direct Anterior Approach   Past Surgical History:  Procedure Laterality Date   ANKLE SURGERY Left    ligament   APPENDECTOMY  1987   AT [facility]   BACK SURGERY     Fusion   BREAST LUMPECTOMY  2003   radiation on right   BREAST LUMPECTOMY WITH RADIOACTIVE SEED LOCALIZATION Left 02/12/2016   Procedure: LEFT BREAST LUMPECTOMY WITH RADIOACTIVE SEED LOCALIZATION;  Surgeon: Excell Seltzer, MD;  Location: [place];  Service: General;  Laterality: Left;   CARDIOVASCULAR STRESS TEST  09/07/05   Nuclear, was negative   CATARACT EXTRACTION Bilateral 01,03   COLONOSCOPY WITH PROPOFOL N/A 05/04/2021   Procedure: COLONOSCOPY WITH PROPOFOL;  Surgeon: Gatha Mayer, MD;  Location: [facility];  Service: Endoscopy;  Laterality: N/A;    ESOPHAGOGASTRODUODENOSCOPY (EGD) WITH PROPOFOL N/A 05/04/2021   Procedure: ESOPHAGOGASTRODUODENOSCOPY (EGD) WITH PROPOFOL;  Surgeon: Gatha Mayer, MD;  Location: [facility];  Service: Endoscopy;  Laterality: N/A;   OOPHORECTOMY     LSO -RSO   PELVIC LAPAROSCOPY  1989   RSO, LYSIS OF ADHESIONS   POLYPECTOMY  05/04/2021   Procedure: POLYPECTOMY;  Surgeon: Gatha Mayer, MD;  Location: [facility];  Service: Endoscopy;;   TOTAL ABDOMINAL HYSTERECTOMY  1987   LSO, APPENDECTOMY   TOTAL HIP ARTHROPLASTY Right 10/28/2013   dr Lorin Mercy   TOTAL HIP ARTHROPLASTY Right 10/28/2013   Procedure: TOTAL HIP ARTHROPLASTY ANTERIOR APPROACH;  Surgeon: Elta Guadeloupe  Becky Sax, MD;  Location: [facility];  Service: Orthopedics;  Laterality: Right;  Right Total Hip Arthroplasty, Direct Anterior Approach   Past Medical History:  Diagnosis Date   Anemia    hx   Arthritis    Asthma    PFTs, February, 2011, moderate obstructive disease with response to bronchodilators, normal lung volumes, moderate reduction in diffusing capacity   Atrial septal aneurysm    Echo, 2008-not noted on 13 echo   CAD (coronary artery disease)    90% distal LAD in the past  /   nuclear, 2008, no ischemia, ejection fraction 70%   Cancer 2002   DUCTAL CIS--S/P LUMPECTOMY, RADIATION AND 6 WEEKS OF TAMOXIFEN   D-dimer, elevated    January, 2014   Depression    Ejection fraction    EF 60%, echo, October, 2008   Elevated CPK    January, 2014   Endometriosis 1989   RIGHT TUBE   Endometriosis 1987   LEFT TUBE/OVARY W FOCAL IN-SITU ENDOMETRIAL ADENOCARCINOMA   GERD (gastroesophageal reflux disease)    occ   H/O hiatal hernia    ?   Hyperlipidemia    Hypertension    Hypothyroidism    Patient has had in the past that she does not need treatment   Kyphoscoliosis    Obstructive airway disease    Pinched nerve    lower back   Shortness of breath    Echo 2/22: EF 60-65, no RWMA, mild LVH, GR 1  DD, GLS -24%, normal RVSF, mild MR, trivial AI,  borderline dilation of aortic root (39 mm)   UTI (lower urinary tract infection)    There were no vitals taken for this visit.  Opioid Risk Score:   Fall Risk Score:  `1  Depression screen [facility] 2/9     07/25/2022    8:44 AM 07/25/2022    8:43 AM 06/20/2022    4:17 PM 05/11/2022    2:39 PM 04/08/2022    1:47 PM 02/08/2022    1:24 PM 01/20/2022   10:27 AM  Depression screen PHQ 2/9  Decreased Interest 0 0 0 1 0 0 0  Down, Depressed, Hopeless 0 0 0 1 0 0 0  PHQ - 2 Score 0 0 0 2 0 0 0  Altered sleeping 2        Tired, decreased energy 3        Change in appetite 0        Feeling bad or failure about yourself  0        Trouble concentrating 0        Moving slowly or fidgety/restless 0        Suicidal thoughts 0        PHQ-9 Score 5        Difficult doing work/chores Somewhat difficult          Review of Systems  Musculoskeletal:  Positive for back pain and gait problem.       Pain in both shoulders & both knees  All other systems reviewed and are negative.     Objective:   Physical Exam Vitals and nursing note reviewed.  Musculoskeletal:     Comments: No Physical Exam : Telephone visit         Assessment & Plan:  1. Lumbar post laminectomy syndrome with severe kyphoscoliosis thoracolumbar spine, s/p lumbar fusion/  08/15/2022 Continue current medication regimen. Continue  Hydrocodone 10/325mg  one tablet every 8 hours #90. We will continue  the opioid monitoring program, this consists of regular clinic visits, examinations, urine drug screen, pill counts as well as use of [place] Controlled Substance Reporting system. A 12 month History has been reviewed on the [place] Controlled Substance Reporting System on 08/15/2022 2. Right Hip OA: S/P Right Hip Replacement 10/28/2013. 08/15/2022 3.Depression: Continue Cymbalta. Has Family Support and Friends.  Counseling with Renato Gails. 08/15/2022 4. Right Greater Trochanteric Tenderness: No complaints today. Continue to  Monitor. Continue with Ice and Heat Therapy. 08/15/2022 5. Poor Appetite/ Frail: No complaints today. Ms. Niehaus states PCP following. Continue to monitor. 08/15/2022 6. Chronic Bilateral Knee Pain: No complaints today .Continue HEP as Tolerated. Continue to Monitor. 07/14/2022 7. Chronic Right  shoulder pain:  Continue HEP as tolerated. Continue to Monitor. 08/15/2022  F/U in 1 month  Telephone Visit Established Patient Location of Patient: In her Home Location of Provider: In the Office Total Time Spent: 10 Minutes

## 2022-08-21 DIAGNOSIS — J449 Chronic obstructive pulmonary disease, unspecified: Secondary | ICD-10-CM | POA: Diagnosis not present

## 2022-08-21 DIAGNOSIS — I5020 Unspecified systolic (congestive) heart failure: Secondary | ICD-10-CM | POA: Diagnosis not present

## 2022-08-30 DIAGNOSIS — J449 Chronic obstructive pulmonary disease, unspecified: Secondary | ICD-10-CM | POA: Diagnosis not present

## 2022-08-30 DIAGNOSIS — I5020 Unspecified systolic (congestive) heart failure: Secondary | ICD-10-CM | POA: Diagnosis not present

## 2022-08-31 ENCOUNTER — Encounter: Payer: Self-pay | Admitting: Internal Medicine

## 2022-09-01 MED ORDER — DULOXETINE HCL 60 MG PO CPEP: ORAL_CAPSULE | ORAL | 1 refills | Status: DC

## 2022-09-01 MED ORDER — DULOXETINE HCL 30 MG PO CPEP: ORAL_CAPSULE | ORAL | 1 refills | Status: DC

## 2022-09-13 ENCOUNTER — Encounter: Payer: Medicare Other | Attending: Registered Nurse | Admitting: Registered Nurse

## 2022-09-13 ENCOUNTER — Encounter: Payer: Self-pay | Admitting: Registered Nurse

## 2022-09-13 VITALS — BP 133/88 | HR 97 | Ht 60.0 in | Wt 140.0 lb

## 2022-09-13 DIAGNOSIS — M48062 Spinal stenosis, lumbar region with neurogenic claudication: Secondary | ICD-10-CM | POA: Diagnosis not present

## 2022-09-13 DIAGNOSIS — Z79891 Long term (current) use of opiate analgesic: Secondary | ICD-10-CM | POA: Insufficient documentation

## 2022-09-13 DIAGNOSIS — G894 Chronic pain syndrome: Secondary | ICD-10-CM | POA: Insufficient documentation

## 2022-09-13 DIAGNOSIS — M24551 Contracture, right hip: Secondary | ICD-10-CM | POA: Diagnosis not present

## 2022-09-13 DIAGNOSIS — Z5181 Encounter for therapeutic drug level monitoring: Secondary | ICD-10-CM | POA: Insufficient documentation

## 2022-09-13 MED ORDER — HYDROCODONE-ACETAMINOPHEN 10-325 MG PO TABS: 1.0000 | ORAL_TABLET | Freq: Three times a day (TID) | ORAL | 0 refills | Status: DC | PRN

## 2022-09-13 NOTE — Progress Notes (Signed)
Subjective:    Patient ID: Hannah Scott, female    DOB: 06-16-1950, 72 y.o.   MRN: [id number]  HPI: Hannah Scott is a 72 y.o. female who returns for follow up appointment for chronic pain and medication refill. She states her pain is located in her lower back. She rates her pain 6. Her current exercise regime is walking with her walker  and performing stretching exercises.  Hannah Scott Morphine equivalent is 30.00 MME.   Last UDS was Performed on 07/14/2022, it was consistent.    Pain Inventory Average Pain 7 Pain Right Now 6 My pain is constant, burning, tingling, and aching  In the last 24 hours, has pain interfered with the following? General activity 10 Relation with others 10 Enjoyment of life 10 What TIME of day is your pain at its worst? morning , daytime, evening, and night Sleep (in general) Poor  Pain is worse with: walking and standing Pain improves with: rest and medication Relief from Meds: 5  Family History  Problem Relation Age of Onset   Heart failure Mother    Pneumonia Mother    Hypertension Mother    Heart disease Mother    Stroke Maternal Grandfather    Hypertension Maternal Grandfather    Heart attack Father    Heart disease Father    Asthma Paternal Uncle        PAT UNCLES   Heart attack Paternal Uncle    Social History   Socioeconomic History   Marital status: Widowed    Spouse name: Not on file   Number of children: Not on file   Years of education: Not on file   Highest education level: Not on file  Occupational History   Not on file  Tobacco Use   Smoking status: Never    Passive exposure: Never   Smokeless tobacco: Never  Vaping Use   Vaping Use: Never used  Substance and Sexual Activity   Alcohol use: No    Alcohol/week: 0.0 standard drinks of alcohol   Drug use: No   Sexual activity: Not Currently    Partners: Male    Birth control/protection: Surgical  Other Topics Concern   Not on file  Social History Narrative   Widowed in  2015   2 sons 1 lives in the area the other is in close contact    1 grandchild   Never smoker no drug use no alcohol   Social Determinants of Health   Financial Resource Strain: Low Risk  (01/11/2022)   Overall Financial Resource Strain (CARDIA)    Difficulty of Paying Living Expenses: Not hard at all  Food Insecurity: No Food Insecurity (01/11/2022)   Hunger Vital Sign    Worried About Running Out of Food in the Last Year: Never true    Ran Out of Food in the Last Year: Never true  Transportation Needs: No Transportation Needs (01/11/2022)   PRAPARE - Administrator, Civil Service (Medical): No    Lack of Transportation (Non-Medical): No  Physical Activity: Inactive (01/11/2022)   Exercise Vital Sign    Days of Exercise per Week: 0 days    Minutes of Exercise per Session: 0 min  Stress: No Stress Concern Present (01/11/2022)   [employer] of Occupational Health - Occupational Stress Questionnaire    Feeling of Stress : Not at all  Social Connections: Socially Integrated (01/11/2022)   Social Connection and Isolation Panel [NHANES]    Frequency of Communication with  Friends and Family: More than three times a week    Frequency of Social Gatherings with Friends and Family: More than three times a week    Attends Religious Services: More than 4 times per year    Active Member of Clubs or Organizations: Yes    Attends Engineer, structural: More than 4 times per year    Marital Status: Married   Past Surgical History:  Procedure Laterality Date   ANKLE SURGERY Left    ligament   APPENDECTOMY  1987   AT [facility]   BACK SURGERY     Fusion   BREAST LUMPECTOMY  2003   radiation on right   BREAST LUMPECTOMY WITH RADIOACTIVE SEED LOCALIZATION Left 02/12/2016   Procedure: LEFT BREAST LUMPECTOMY WITH RADIOACTIVE SEED LOCALIZATION;  Surgeon: Glenna Fellows, MD;  Location: [facility];  Service: General;  Laterality: Left;   CARDIOVASCULAR STRESS TEST   09/07/05   Nuclear, was negative   CATARACT EXTRACTION Bilateral 01,03   COLONOSCOPY WITH PROPOFOL N/A 05/04/2021   Procedure: COLONOSCOPY WITH PROPOFOL;  Surgeon: Iva Boop, MD;  Location: [facility];  Service: Endoscopy;  Laterality: N/A;   ESOPHAGOGASTRODUODENOSCOPY (EGD) WITH PROPOFOL N/A 05/04/2021   Procedure: ESOPHAGOGASTRODUODENOSCOPY (EGD) WITH PROPOFOL;  Surgeon: Iva Boop, MD;  Location: [facility];  Service: Endoscopy;  Laterality: N/A;   OOPHORECTOMY     LSO -RSO   PELVIC LAPAROSCOPY  1989   RSO, LYSIS OF ADHESIONS   POLYPECTOMY  05/04/2021   Procedure: POLYPECTOMY;  Surgeon: Iva Boop, MD;  Location: [facility];  Service: Endoscopy;;   TOTAL ABDOMINAL HYSTERECTOMY  1987   LSO, APPENDECTOMY   TOTAL HIP ARTHROPLASTY Right 10/28/2013   dr Ophelia Charter   TOTAL HIP ARTHROPLASTY Right 10/28/2013   Procedure: TOTAL HIP ARTHROPLASTY ANTERIOR APPROACH;  Surgeon: Eldred Manges, MD;  Location: [facility];  Service: Orthopedics;  Laterality: Right;  Right Total Hip Arthroplasty, Direct Anterior Approach   Past Surgical History:  Procedure Laterality Date   ANKLE SURGERY Left    ligament   APPENDECTOMY  1987   AT [facility]   BACK SURGERY     Fusion   BREAST LUMPECTOMY  2003   radiation on right   BREAST LUMPECTOMY WITH RADIOACTIVE SEED LOCALIZATION Left 02/12/2016   Procedure: LEFT BREAST LUMPECTOMY WITH RADIOACTIVE SEED LOCALIZATION;  Surgeon: Glenna Fellows, MD;  Location: [facility];  Service: General;  Laterality: Left;   CARDIOVASCULAR STRESS TEST  09/07/05   Nuclear, was negative   CATARACT EXTRACTION Bilateral 01,03   COLONOSCOPY WITH PROPOFOL N/A 05/04/2021   Procedure: COLONOSCOPY WITH PROPOFOL;  Surgeon: Iva Boop, MD;  Location: [facility];  Service: Endoscopy;  Laterality: N/A;   ESOPHAGOGASTRODUODENOSCOPY (EGD) WITH PROPOFOL N/A 05/04/2021   Procedure: ESOPHAGOGASTRODUODENOSCOPY (EGD) WITH PROPOFOL;  Surgeon: Iva Boop, MD;  [facility];  Service: Endoscopy;  Laterality: N/A;   OOPHORECTOMY     LSO -RSO   PELVIC LAPAROSCOPY  1989   RSO, LYSIS OF ADHESIONS   POLYPECTOMY  05/04/2021   Procedure: POLYPECTOMY;  Surgeon: Iva Boop, MD;  Location: [facility];  Service: Endoscopy;;   TOTAL ABDOMINAL HYSTERECTOMY  1987   LSO, APPENDECTOMY   TOTAL HIP ARTHROPLASTY Right 10/28/2013   dr Ophelia Charter   TOTAL HIP ARTHROPLASTY Right 10/28/2013   Procedure: TOTAL HIP ARTHROPLASTY ANTERIOR APPROACH;  Surgeon: Eldred Manges, MD;  Location: [facility];  Service: Orthopedics;  Laterality: Right;  Right Total Hip Arthroplasty, Direct  Anterior Approach   Past Medical History:  Diagnosis Date   Anemia    hx   Arthritis    Asthma    PFTs, February, 2011, moderate obstructive disease with response to bronchodilators, normal lung volumes, moderate reduction in diffusing capacity   Atrial septal aneurysm    Echo, 2008-not noted on 13 echo   CAD (coronary artery disease)    90% distal LAD in the past  /   nuclear, 2008, no ischemia, ejection fraction 70%   Cancer (HCC) 2002   DUCTAL CIS--S/P LUMPECTOMY, RADIATION AND 6 WEEKS OF TAMOXIFEN   D-dimer, elevated    January, 2014   Depression    Ejection fraction    EF 60%, echo, October, 2008   Elevated CPK    January, 2014   Endometriosis 1989   RIGHT TUBE   Endometriosis 1987   LEFT TUBE/OVARY W FOCAL IN-SITU ENDOMETRIAL ADENOCARCINOMA   GERD (gastroesophageal reflux disease)    occ   H/O hiatal hernia    ?   Hyperlipidemia    Hypertension    Hypothyroidism    Patient has had in the past that she does not need treatment   Kyphoscoliosis    Obstructive airway disease (HCC)    Pinched nerve    lower back   Shortness of breath    Echo 2/22: EF 60-65, no RWMA, mild LVH, GR 1  DD, GLS -24%, normal RVSF, mild MR, trivial AI, borderline dilation of aortic root (39 mm)   UTI (lower urinary tract infection)    BP 133/88   Pulse 97   Ht 5' (1.524 m)   Wt 140 lb (63.5 kg)   SpO2  93%   BMI 27.34 kg/m   Opioid Risk Score:   Fall Risk Score:  `1  Depression screen [facility] 2/9     09/13/2022    2:51 PM 08/15/2022   11:52 AM 07/25/2022    8:44 AM 07/25/2022    8:43 AM 06/20/2022    4:17 PM 05/11/2022    2:39 PM 04/08/2022    1:47 PM  Depression screen PHQ 2/9  Decreased Interest 1 1 0 0 0 1 0  Down, Depressed, Hopeless 1 1 0 0 0 1 0  PHQ - 2 Score 2 2 0 0 0 2 0  Altered sleeping   2      Tired, decreased energy   3      Change in appetite   0      Feeling bad or failure about yourself    0      Trouble concentrating   0      Moving slowly or fidgety/restless   0      Suicidal thoughts   0      PHQ-9 Score   5      Difficult doing work/chores   Somewhat difficult        Review of Systems  Musculoskeletal:  Positive for back pain and gait problem.       Pain in both knees, both shoulders      Objective:   Physical Exam Vitals and nursing note reviewed.  Constitutional:      Appearance: Normal appearance.  Cardiovascular:     Rate and Rhythm: Normal rate and regular rhythm.     Pulses: Normal pulses.     Heart sounds: Normal heart sounds.  Pulmonary:     Effort: Pulmonary effort is normal.     Breath sounds: Normal breath sounds.  Musculoskeletal:     Cervical back: Normal range of motion and neck supple.     Comments: Normal Muscle Bulk and Muscle Testing Reveals:  Upper Extremities: Right Upper Extremity: Decreased ROM 90 Degrees and Muscle Strength 5/5 Right AC Joint Tenderness  Left Upper Extremity: Full ROM and Muscle Strength 5/5 Lumbar Paraspinal Tenderness: L-4-L-5 Lower Extremities: Full ROM and Muscle Strength 5/5 Arises from Chair slowly , transfer to wheelchair.  Antalgic  Gait     Skin:    General: Skin is warm and dry.  Neurological:     Mental Status: She is alert and oriented to person, place, and time.  Psychiatric:        Mood and Affect: Mood normal.        Behavior: Behavior normal.         Assessment & Plan:  1.  Lumbar post laminectomy syndrome with severe kyphoscoliosis thoracolumbar spine, s/p lumbar fusion/  09/13/2022 Continue current medication regimen. Continue  Hydrocodone 10/325mg  one tablet every 8 hours #90. We will continue the opioid monitoring program, this consists of regular clinic visits, examinations, urine drug screen, pill counts as well as use of [place] Controlled Substance Reporting system. A 12 month History has been reviewed on the [place] Controlled Substance Reporting System on 09/13/2022 2. Right Hip OA: S/P Right Hip Replacement 10/28/2013. 09/13/2022 3.Depression: Continue Cymbalta. Has Family Support and Friends.  Counseling with Renato Gails. 09/13/2022 4. Right Greater Trochanteric Tenderness: No complaints today. Continue to Monitor. Continue with Ice and Heat Therapy. 09/13/2022 5. Poor Appetite/ Frail: No complaints today. Ms. Poulos states PCP following. Continue to monitor. 09/13/2022 6. Chronic Bilateral Knee Pain: No complaints today .Continue HEP as Tolerated. Continue to Monitor. 09/13/2022 7. Chronic Right  shoulder pain:  Continue HEP as tolerated. Continue to Monitor. 09/13/2022   F/U in 1 month

## 2022-09-20 DIAGNOSIS — J449 Chronic obstructive pulmonary disease, unspecified: Secondary | ICD-10-CM | POA: Diagnosis not present

## 2022-09-20 DIAGNOSIS — I5020 Unspecified systolic (congestive) heart failure: Secondary | ICD-10-CM | POA: Diagnosis not present

## 2022-10-01 ENCOUNTER — Other Ambulatory Visit: Payer: Self-pay | Admitting: Internal Medicine

## 2022-10-01 DIAGNOSIS — R0609 Other forms of dyspnea: Secondary | ICD-10-CM

## 2022-10-04 ENCOUNTER — Other Ambulatory Visit: Payer: Self-pay | Admitting: Internal Medicine

## 2022-10-21 ENCOUNTER — Encounter: Payer: Medicare Other | Attending: Registered Nurse | Admitting: Registered Nurse

## 2022-10-21 ENCOUNTER — Encounter: Payer: Self-pay | Admitting: Registered Nurse

## 2022-10-21 VITALS — BP 145/83 | HR 71 | Ht 60.0 in | Wt 135.0 lb

## 2022-10-21 DIAGNOSIS — G894 Chronic pain syndrome: Secondary | ICD-10-CM | POA: Insufficient documentation

## 2022-10-21 DIAGNOSIS — Z79891 Long term (current) use of opiate analgesic: Secondary | ICD-10-CM | POA: Diagnosis not present

## 2022-10-21 DIAGNOSIS — M48062 Spinal stenosis, lumbar region with neurogenic claudication: Secondary | ICD-10-CM | POA: Insufficient documentation

## 2022-10-21 DIAGNOSIS — M25552 Pain in left hip: Secondary | ICD-10-CM | POA: Insufficient documentation

## 2022-10-21 DIAGNOSIS — G8929 Other chronic pain: Secondary | ICD-10-CM | POA: Insufficient documentation

## 2022-10-21 DIAGNOSIS — Z5181 Encounter for therapeutic drug level monitoring: Secondary | ICD-10-CM | POA: Insufficient documentation

## 2022-10-21 DIAGNOSIS — M1611 Unilateral primary osteoarthritis, right hip: Secondary | ICD-10-CM

## 2022-10-21 DIAGNOSIS — M25551 Pain in right hip: Secondary | ICD-10-CM | POA: Insufficient documentation

## 2022-10-21 DIAGNOSIS — M24551 Contracture, right hip: Secondary | ICD-10-CM | POA: Insufficient documentation

## 2022-10-21 MED ORDER — HYDROCODONE-ACETAMINOPHEN 10-325 MG PO TABS: 1.0000 | ORAL_TABLET | Freq: Three times a day (TID) | ORAL | 0 refills | Status: DC | PRN

## 2022-10-21 NOTE — Progress Notes (Addendum)
Subjective:    Patient ID: Hannah Scott, female    DOB: December 09, 1950, 72 y.o.   MRN: [id number]  HPI: Hannah Scott is a 72 y.o. female who is scheduled for Telephone appointment for chronic pain and medication refill. I connected with Hannah Scott  on  10/21/2022 by telephone and verified that I am speaking with the correct person using two identifiers.  Location: Patient: In her Home Provider: In the Office    I discussed the limitations, risks, security and privacy concerns of performing an evaluation and management service by telephone and the availability of in person appointments. I also discussed with the patient that there may be a patient responsible charge related to this service. The patient expressed understanding and agreed to proceed.  She states her pain is located in her lower back and bilateral hip pain. She rates her pain 6. Her current exercise regime is walking and performing stretching exercises.  Ms. Lijewski Morphine equivalent is 30.00 MME.   Last Oral Swab was 07/14/2022, it was consistent.      Pain Inventory Average Pain 6 Pain Right Now 6 My pain is intermittent, burning, tingling, and aching  In the last 24 hours, has pain interfered with the following? General activity 10 Relation with others 10 Enjoyment of life 10 What TIME of day is your pain at its worst? morning , daytime, evening, and night Sleep (in general) Poor  Pain is worse with: walking, standing, and some activites Pain improves with: rest and medication Relief from Meds: 5  Family History  Problem Relation Age of Onset   Heart failure Mother    Pneumonia Mother    Hypertension Mother    Heart disease Mother    Stroke Maternal Grandfather    Hypertension Maternal Grandfather    Heart attack Father    Heart disease Father    Asthma Paternal Uncle        PAT UNCLES   Heart attack Paternal Uncle    Social History   Socioeconomic History   Marital status: Widowed    Spouse name: Not  on file   Number of children: Not on file   Years of education: Not on file   Highest education level: Not on file  Occupational History   Not on file  Tobacco Use   Smoking status: Never    Passive exposure: Never   Smokeless tobacco: Never  Vaping Use   Vaping Use: Never used  Substance and Sexual Activity   Alcohol use: No    Alcohol/week: 0.0 standard drinks of alcohol   Drug use: No   Sexual activity: Not Currently    Partners: Male    Birth control/protection: Surgical  Other Topics Concern   Not on file  Social History Narrative   Widowed in 2015   2 sons 1 lives in the area the other is in close contact    1 grandchild   Never smoker no drug use no alcohol   Social Determinants of Health   Financial Resource Strain: Low Risk  (01/11/2022)   Overall Financial Resource Strain (CARDIA)    Difficulty of Paying Living Expenses: Not hard at all  Food Insecurity: No Food Insecurity (01/11/2022)   Hunger Vital Sign    Worried About Running Out of Food in the Last Year: Never true    Ran Out of Food in the Last Year: Never true  Transportation Needs: No Transportation Needs (01/11/2022)   PRAPARE - Transportation  Lack of Transportation (Medical): No    Lack of Transportation (Non-Medical): No  Physical Activity: Inactive (01/11/2022)   Exercise Vital Sign    Days of Exercise per Week: 0 days    Minutes of Exercise per Session: 0 min  Stress: No Stress Concern Present (01/11/2022)   [employer] of Occupational Health - Occupational Stress Questionnaire    Feeling of Stress : Not at all  Social Connections: Socially Integrated (01/11/2022)   Social Connection and Isolation Panel [NHANES]    Frequency of Communication with Friends and Family: More than three times a week    Frequency of Social Gatherings with Friends and Family: More than three times a week    Attends Religious Services: More than 4 times per year    Active Member of Clubs or Organizations: Yes     Attends Engineer, structural: More than 4 times per year    Marital Status: Married   Past Surgical History:  Procedure Laterality Date   ANKLE SURGERY Left    ligament   APPENDECTOMY  1987   AT [facility]   BACK SURGERY     Fusion   BREAST LUMPECTOMY  2003   radiation on right   BREAST LUMPECTOMY WITH RADIOACTIVE SEED LOCALIZATION Left 02/12/2016   Procedure: LEFT BREAST LUMPECTOMY WITH RADIOACTIVE SEED LOCALIZATION;  Surgeon: Glenna Fellows, MD;  Location: [facility];  Service: General;  Laterality: Left;   CARDIOVASCULAR STRESS TEST  09/07/05   Nuclear, was negative   CATARACT EXTRACTION Bilateral 01,03   COLONOSCOPY WITH PROPOFOL N/A 05/04/2021   Procedure: COLONOSCOPY WITH PROPOFOL;  Surgeon: Iva Boop, MD;  Location: [facility];  Service: Endoscopy;  Laterality: N/A;   ESOPHAGOGASTRODUODENOSCOPY (EGD) WITH PROPOFOL N/A 05/04/2021   Procedure: ESOPHAGOGASTRODUODENOSCOPY (EGD) WITH PROPOFOL;  Surgeon: Iva Boop, MD;  Location: [facility];  Service: Endoscopy;  Laterality: N/A;   OOPHORECTOMY     LSO -RSO   PELVIC LAPAROSCOPY  1989   [facility], LYSIS OF ADHESIONS   POLYPECTOMY  05/04/2021   Procedure: POLYPECTOMY;  Surgeon: Iva Boop, MD;  Location: [facility];  Service: Endoscopy;;   TOTAL ABDOMINAL HYSTERECTOMY  1987   [facility], APPENDECTOMY   TOTAL HIP ARTHROPLASTY Right 10/28/2013   dr Ophelia Charter   TOTAL HIP ARTHROPLASTY Right 10/28/2013   Procedure: TOTAL HIP ARTHROPLASTY ANTERIOR APPROACH;  Surgeon: Eldred Manges, MD;  Location: [facility];  Service: Orthopedics;  Laterality: Right;  Right Total Hip Arthroplasty, Direct Anterior Approach   Past Surgical History:  Procedure Laterality Date   ANKLE SURGERY Left    ligament   APPENDECTOMY  1987   AT [facility]   BACK SURGERY     Fusion   BREAST LUMPECTOMY  2003   radiation on right   BREAST LUMPECTOMY WITH RADIOACTIVE SEED LOCALIZATION Left 02/12/2016   Procedure: LEFT BREAST LUMPECTOMY WITH RADIOACTIVE  SEED LOCALIZATION;  Surgeon: Glenna Fellows, MD;  Location: [facility];  Service: General;  Laterality: Left;   CARDIOVASCULAR STRESS TEST  09/07/05   Nuclear, was negative   CATARACT EXTRACTION Bilateral 01,03   COLONOSCOPY WITH PROPOFOL N/A 05/04/2021   Procedure: COLONOSCOPY WITH PROPOFOL;  Surgeon: Iva Boop, MD;  Location: [facility];  Service: Endoscopy;  Laterality: N/A;   ESOPHAGOGASTRODUODENOSCOPY (EGD) WITH PROPOFOL N/A 05/04/2021   Procedure: ESOPHAGOGASTRODUODENOSCOPY (EGD) WITH PROPOFOL;  Surgeon: Iva Boop, MD;  Location: [facility];  Service: Endoscopy;  Laterality: N/A;   OOPHORECTOMY     LSO -RSO  PELVIC LAPAROSCOPY  1989   [facility], LYSIS OF ADHESIONS   POLYPECTOMY  05/04/2021   Procedure: POLYPECTOMY;  Surgeon: Iva Boop, MD;  Location: [facility];  Service: Endoscopy;;   TOTAL ABDOMINAL HYSTERECTOMY  1987   [facility], APPENDECTOMY   TOTAL HIP ARTHROPLASTY Right 10/28/2013   dr Ophelia Charter   TOTAL HIP ARTHROPLASTY Right 10/28/2013   Procedure: TOTAL HIP ARTHROPLASTY ANTERIOR APPROACH;  Surgeon: Eldred Manges, MD;  Location: [facility];  Service: Orthopedics;  Laterality: Right;  Right Total Hip Arthroplasty, Direct Anterior Approach   Past Medical History:  Diagnosis Date   Anemia    hx   Arthritis    Asthma    PFTs, February, 2011, moderate obstructive disease with response to bronchodilators, normal lung volumes, moderate reduction in diffusing capacity   Atrial septal aneurysm    Echo, 2008-not noted on 13 echo   CAD (coronary artery disease)    90% distal LAD in the past  /   nuclear, 2008, no ischemia, ejection fraction 70%   Cancer (HCC) 2002   DUCTAL CIS--S/P LUMPECTOMY, RADIATION AND 6 WEEKS OF TAMOXIFEN   D-dimer, elevated    January, 2014   Depression    Ejection fraction    EF 60%, echo, October, 2008   Elevated CPK    January, 2014   Endometriosis 1989   RIGHT TUBE   Endometriosis 1987   LEFT TUBE/OVARY W FOCAL IN-SITU  ENDOMETRIAL ADENOCARCINOMA   GERD (gastroesophageal reflux disease)    occ   H/O hiatal hernia    ?   Hyperlipidemia    Hypertension    Hypothyroidism    Patient has had in the past that she does not need treatment   Kyphoscoliosis    Obstructive airway disease (HCC)    Pinched nerve    lower back   Shortness of breath    Echo 2/22: EF 60-65, no RWMA, mild LVH, GR 1  DD, GLS -24%, normal RVSF, mild MR, trivial AI, borderline dilation of aortic root (39 mm)   UTI (lower urinary tract infection)    BP (!) 145/83 Comment: Vitals per Patient today's a phone visit.  Pulse 71   Ht 5' (1.524 m)   Wt 135 lb (61.2 kg)   BMI 26.37 kg/m   Opioid Risk Score:   Fall Risk Score:  `1  Depression screen PHQ 2/9     10/21/2022    2:10 PM 09/13/2022    2:51 PM 08/15/2022   11:52 AM 07/25/2022    8:44 AM 07/25/2022    8:43 AM 06/20/2022    4:17 PM 05/11/2022    2:39 PM  Depression screen PHQ 2/9  Decreased Interest 1 1 1  0 0 0 1  Down, Depressed, Hopeless 1 1 1  0 0 0 1  PHQ - 2 Score 2 2 2  0 0 0 2  Altered sleeping    2     Tired, decreased energy    3     Change in appetite    0     Feeling bad or failure about yourself     0     Trouble concentrating    0     Moving slowly or fidgety/restless    0     Suicidal thoughts    0     PHQ-9 Score    5     Difficult doing work/chores    Somewhat difficult       Review of Systems  Musculoskeletal:  Positive for back pain and gait problem.  All other systems reviewed and are negative.     Objective:   Physical Exam Vitals and nursing note reviewed.  Musculoskeletal:     Comments: No Physical Exam Performed: Telephone Visit         Assessment & Plan:  1. Lumbar post laminectomy syndrome with severe kyphoscoliosis thoracolumbar spine, s/p lumbar fusion/  10/21/2022 Continue current medication regimen. Continue  Hydrocodone 10/325mg  one tablet every 8 hours #90. We will continue the opioid monitoring program, this consists of regular  clinic visits, examinations, urine drug screen, pill counts as well as use of [place] Controlled Substance Reporting system. A 12 month History has been reviewed on the [place] Controlled Substance Reporting System on 10/21/2022 2. Right Hip OA: S/P Right Hip Replacement 10/28/2013. 10/21/2022 3.Depression: Continue Cymbalta. Has Family Support and Friends.  Counseling with Renato Gails. 10/21/2022 4. Bilateral Hip Pain/ Right Greater Trochanteric Tenderness: No complaints today. Continue to Monitor. Continue with Ice and Heat Therapy. 10/21/2022 5. Poor Appetite/ Frail: No complaints today. Ms. Edley states PCP following. Continue to monitor. 10/21/2022 6. Chronic Bilateral Knee Pain: No complaints today .Continue HEP as Tolerated. Continue to Monitor. 10/21/2022 7. Chronic Right  shoulder pain:  No complaints today.Continue HEP as tolerated. Continue to Monitor. 09/13/2022   F/U in 1 month  Telephone Call  Establish Patient Location of Patient in her Home  Location of Provider : In the Office Total time Spent: 10 Minutes

## 2022-11-04 ENCOUNTER — Telehealth: Payer: Self-pay | Admitting: Cardiology

## 2022-11-04 NOTE — Telephone Encounter (Signed)
Pt c/o Shortness Of Breath: STAT if SOB developed within the last 24 hours or pt is noticeably SOB on the phone  1. Are you currently SOB (can you hear that pt is SOB on the phone)? No, but it happened yesterday and is happening more often   2. How long have you been experiencing SOB? Its been months   3. Are you SOB when sitting or when up moving around? Moving around   4. Are you currently experiencing any other symptoms? Tightness in her neck sometimes

## 2022-11-04 NOTE — Telephone Encounter (Signed)
Patient call sent straight to triage. Patient complaining SOB that comes and going and along with that she sometimes has a tightness in her neck.  Patient denies chest pain, but did not have chest pain with hear attacks in the past. Patient stated these episodes last for about 10 minutes to an hour, brought on by activity. Since this is not new and has been going on for months, made patient an appointment to see Ronie Spies PA on 11/14/22. Will send message to see if Dr. Shari Prows wants patient to do anything in the mean time and see if she needs to be seen any sooner than 11/14/22.

## 2022-11-07 ENCOUNTER — Other Ambulatory Visit: Payer: Self-pay | Admitting: Internal Medicine

## 2022-11-07 DIAGNOSIS — I10 Essential (primary) hypertension: Secondary | ICD-10-CM

## 2022-11-07 DIAGNOSIS — I517 Cardiomegaly: Secondary | ICD-10-CM

## 2022-11-08 NOTE — Telephone Encounter (Signed)
Error

## 2022-11-12 ENCOUNTER — Other Ambulatory Visit: Payer: Self-pay | Admitting: Internal Medicine

## 2022-11-14 ENCOUNTER — Ambulatory Visit: Payer: Medicare Other | Admitting: Physician Assistant

## 2022-11-14 NOTE — Progress Notes (Deleted)
Cardiology Office Note:  .   Date:  11/14/2022  ID:  Hannah Scott, DOB 1950-05-21, MRN [id number] PCP: Pincus Sanes, MD  [facility] Providers Cardiologist:  Meriam Sprague, MD { Click to update primary MD,subspecialty MD or APP then REFRESH:1}   History of Present Illness: .   Hannah Scott is a 72 y.o. female 72 year old female with past medical history of prior MI in 7 and 1998 secondary to coronary vasospasm, distal LAD disease, chronic HFpEF, HTN, HLD, atrial septal aneurysm, breast cancer, kyphoscoliosis and asthma. She presents today for regarding increased shortness of breath.    Previously scheduled for Coronary CTA in 2019 but was not completed due to inability to gain IV access. She had a MPI on 12/26/19 that was normal with no evidence of ischemia.   Prior problems with shortness of breath noted at multiple OV, in 05/2020 she saw Tereso Newcomer with increased DOE as well as chest discomfort. Echocardiogram was reassuring. Her amlodipine was stopped in setting of hypotension. She was evaluated by pulmonology who recommended continuing holding ACE and only use bystolic, was started on prednisone.   In 08/2021 she was admitted to [facility] for septic shock secondary to LE cellulitis and UTI. She was intubated. Echocardiogram 09/11/21 indicated LV function greater than 75, no RWMA. Her statin was discontinued due to elevated LFTs. Troponin was elevated (peaked at 670) and was started on heparin but was discontinued due to thrombocytopenia. She was discharged to rehab.   In 09/2021 she was readmitted after being found unresposive and hypoxic into the 30s, was again intubated and treated for bilateral pneumonia. Found to be fluid overloaded on exam requiring diuresis. Echo at that time indicate EF 60 to 65%, normal RV function, and moderate to severe pulmonary hypertension, RSVP 61 mmHg.   Last seen by Dr. Shari Prows on 02/28/22 and felt to be improving, was weaning off oxygen.  Repeat echocardiogram on 03/2022 indicated EF 60-65%, moderate asymmetric LVH of septal segment, G1DD, elevated LVEDP, mildly enlarged RV, moderately elevated PASP, severe RAE, mild MR, mild AI, moderate TR, mild dilation of ascending aorta. Scheduled to have repeat echocardiogram later this year.   CAD with reported distal LAD disease, h/o vasospasm Chronic HFpEF/mild AI/ mild MR/ moderate PR Pulmonary hypertension:  HLD       ROS: *** *** denies chest pain, shortness of breath, lower extremity edema, fatigue, palpitations, melena, hematuria, hemoptysis, diaphoresis, weakness, presyncope, syncope, orthopnea, and PND.  Studies Reviewed: .       Cardiac Studies & Procedures     STRESS TESTS  MYOCARDIAL PERFUSION IMAGING 12/26/2019  Narrative  The left ventricular ejection fraction is normal (55-65%).  Nuclear stress EF: 64%. No wall motion abnormalities  There was no ST segment deviation noted during stress.  The study is normal.  This is a low risk study. There is no infarct or ischemia identified.  Donato Schultz, MD   ECHOCARDIOGRAM  ECHOCARDIOGRAM COMPLETE 04/14/2022  Narrative ECHOCARDIOGRAM REPORT    Patient Name:   Hannah Scott   Date of Exam: 04/14/2022 Medical Rec #:  [id number]     Height:       60.0 in Accession #:    [id number]    Weight:       136.0 lb Date of Birth:  1950/04/29     BSA:          1.584 m Patient Age:    89 years  BP:           143/80 mmHg Patient Gender: F             HR:           77 bpm. Exam Location:  [address]  Procedure: 2D Echo, Cardiac Doppler and Color Doppler  Indications:    I27.20 Pulmonary Hypertension  History:        Patient has prior history of Echocardiogram examinations, most recent 09/25/2021. Previous Myocardial Infarction and CAD, Signs/Symptoms:Shortness of Breath; Risk Factors:Hypertension and Dyslipidemia. Atrial septal aneurysm. Hypotension. Asthma. Septic shock. Kyphoscoliosis.  Sonographer:    Cathie Beams RCS Referring Phys: [id number] Herbert Seta E [place]   Sonographer Comments: Technically difficult to position patient. IMPRESSIONS   1. Left ventricular ejection fraction, by estimation, is 60 to 65%. The left ventricle has normal function. The left ventricle has no regional wall motion abnormalities. There is moderate asymmetric left ventricular hypertrophy of the septal segment. Left ventricular diastolic parameters are consistent with Grade I diastolic dysfunction (impaired relaxation). Elevated left ventricular end-diastolic pressure. 2. Right ventricular systolic function is normal. The right ventricular size is mildly enlarged. There is moderately elevated pulmonary artery systolic pressure. 3. Right atrial size was severely dilated. 4. The mitral valve is normal in structure. Mild mitral valve regurgitation. No evidence of mitral stenosis. 5. The aortic valve is calcified. Aortic valve regurgitation is mild. No aortic stenosis is present. 6. Pulmonic valve regurgitation is moderate. 7. Aortic dilatation noted. There is mild dilatation of the ascending aorta, measuring 37 mm. 8. The inferior vena cava is normal in size with <50% respiratory variability, suggesting right atrial pressure of 8 mmHg.  FINDINGS Left Ventricle: Left ventricular ejection fraction, by estimation, is 60 to 65%. The left ventricle has normal function. The left ventricle has no regional wall motion abnormalities. The left ventricular internal cavity size was normal in size. There is moderate asymmetric left ventricular hypertrophy of the septal segment. Left ventricular diastolic parameters are consistent with Grade I diastolic dysfunction (impaired relaxation). Elevated left ventricular end-diastolic pressure.  Right Ventricle: The right ventricular size is mildly enlarged. No increase in right ventricular wall thickness. Right ventricular systolic function is normal. There is moderately elevated pulmonary  artery systolic pressure. The tricuspid regurgitant velocity is 3.17 m/s, and with an assumed right atrial pressure of 8 mmHg, the estimated right ventricular systolic pressure is 48.2 mmHg.  Left Atrium: Left atrial size was normal in size.  Right Atrium: Right atrial size was severely dilated.  Pericardium: There is no evidence of pericardial effusion.  Mitral Valve: The mitral valve is normal in structure. Mild mitral annular calcification. Mild mitral valve regurgitation. No evidence of mitral valve stenosis.  Tricuspid Valve: The tricuspid valve is normal in structure. Tricuspid valve regurgitation is mild . No evidence of tricuspid stenosis.  Aortic Valve: The aortic valve is calcified. Aortic valve regurgitation is mild. No aortic stenosis is present.  Pulmonic Valve: The pulmonic valve was normal in structure. Pulmonic valve regurgitation is moderate. No evidence of pulmonic stenosis.  Aorta: The aortic root is normal in size and structure and aortic dilatation noted. There is mild dilatation of the ascending aorta, measuring 37 mm.  Venous: The inferior vena cava is normal in size with less than 50% respiratory variability, suggesting right atrial pressure of 8 mmHg.  IAS/Shunts: No atrial level shunt detected by color flow Doppler.   LEFT VENTRICLE PLAX 2D LVIDd:         3.80  cm   Diastology LVIDs:         2.10 cm   LV e' medial:    3.73 cm/s LV PW:         1.00 cm   LV E/e' medial:  15.7 LV IVS:        1.60 cm   LV e' lateral:   7.95 cm/s LVOT diam:     2.20 cm   LV E/e' lateral: 7.4 LV SV:         64 LV SV Index:   40 LVOT Area:     3.80 cm   RIGHT VENTRICLE RV Basal diam:  3.50 cm RV S prime:     8.11 cm/s TAPSE (M-mode): 1.5 cm RVSP:           48.2 mmHg  LEFT ATRIUM           Index        RIGHT ATRIUM           Index LA diam:      4.00 cm 2.52 cm/m   RA Pressure: 8.00 mmHg LA Vol (A2C): 28.9 ml 18.24 ml/m  RA Area:     21.60 cm LA Vol (A4C): 34.7 ml  21.90 ml/m  RA Volume:   66.00 ml  41.66 ml/m AORTIC VALVE             PULMONIC VALVE LVOT Vmax:   86.70 cm/s  PR End Diast Vel: 11.02 msec LVOT Vmean:  44.900 cm/s LVOT VTI:    0.168 m  AORTA Ao Root diam: 3.40 cm Ao Asc diam:  3.70 cm  MITRAL VALVE               TRICUSPID VALVE MV Area (PHT): 4.74 cm    TR Peak grad:   40.2 mmHg MV Decel Time: 160 msec    TR Vmax:        317.00 cm/s MR Peak grad: 52.7 mmHg    Estimated RAP:  8.00 mmHg MR Mean grad: 27.0 mmHg    RVSP:           48.2 mmHg MR Vmax:      363.00 cm/s MR Vmean:     239.0 cm/s   SHUNTS MV E velocity: 58.60 cm/s  Systemic VTI:  0.17 m MV A velocity: 99.70 cm/s  Systemic Diam: 2.20 cm MV E/A ratio:  0.59  Chilton Si MD Electronically signed by Chilton Si MD Signature Date/Time: 04/14/2022/3:54:08 PM    Final             *** Risk Assessment/Calculations:   {Does this patient have ATRIAL FIBRILLATION?[phone]} No BP recorded.  {Refresh Note OR Click here to enter BP  :1}***       Physical Exam:   VS:  There were no vitals taken for this visit.   Wt Readings from Last 3 Encounters:  10/21/22 135 lb (61.2 kg)  09/13/22 140 lb (63.5 kg)  08/15/22 138 lb (62.6 kg)    GEN: Well nourished, well developed in no acute distress NECK: No JVD; No carotid bruits CARDIAC: ***RRR, no murmurs, rubs, gallops RESPIRATORY:  Clear to auscultation without rales, wheezing or rhonchi  ABDOMEN: Soft, non-tender, non-distended EXTREMITIES:  No edema; No deformity   ASSESSMENT AND PLAN: .   ***    {Are you ordering a CV Procedure (e.g. stress test, cath, DCCV, TEE, etc)?   Press F2        :[id number]}  Dispo: ***  Signed, Charlesetta Garibaldi  Armando Gang, NP

## 2022-11-22 ENCOUNTER — Encounter: Payer: Self-pay | Admitting: Registered Nurse

## 2022-11-22 ENCOUNTER — Encounter: Payer: Medicare Other | Attending: Registered Nurse | Admitting: Registered Nurse

## 2022-11-22 VITALS — BP 124/77 | HR 80 | Ht 60.0 in | Wt 136.4 lb

## 2022-11-22 DIAGNOSIS — M24551 Contracture, right hip: Secondary | ICD-10-CM | POA: Diagnosis not present

## 2022-11-22 DIAGNOSIS — Z79891 Long term (current) use of opiate analgesic: Secondary | ICD-10-CM | POA: Diagnosis not present

## 2022-11-22 DIAGNOSIS — M48062 Spinal stenosis, lumbar region with neurogenic claudication: Secondary | ICD-10-CM

## 2022-11-22 DIAGNOSIS — Z5181 Encounter for therapeutic drug level monitoring: Secondary | ICD-10-CM

## 2022-11-22 DIAGNOSIS — G894 Chronic pain syndrome: Secondary | ICD-10-CM

## 2022-11-22 MED ORDER — HYDROCODONE-ACETAMINOPHEN 10-325 MG PO TABS: 1.0000 | ORAL_TABLET | Freq: Three times a day (TID) | ORAL | 0 refills | Status: DC | PRN

## 2022-11-22 NOTE — Progress Notes (Signed)
Subjective:    Patient ID: Hannah Scott, female    DOB: November 25, 1950, 72 y.o.   MRN: [id number]  HPI: Hannah Scott is a 72 y.o. female who returns for follow up appointment for chronic pain and medication refill. She states her pain is located in her lower back pain. She rates her pain 6. Her current exercise regime is walking and performing stretching exercises.  [name] arrived with oxygen desaturation, O2 saturation was re-checked, she denies SOB.   [name] Morphine equivalent is 30.00 MME.   Last Oral Swab was Performed on 07/14/2022, it was consistent.     Pain Inventory Average Pain 6 Pain Right Now 6 My pain is constant, burning, tingling, and aching  In the last 24 hours, has pain interfered with the following? General activity 10 Relation with others 10 Enjoyment of life 10 What TIME of day is your pain at its worst? morning , daytime, evening, and night Sleep (in general) Poor  Pain is worse with: walking, standing, and some activites Pain improves with: rest and medication Relief from Meds: 5  Family History  Problem Relation Age of Onset   Heart failure Mother    Pneumonia Mother    Hypertension Mother    Heart disease Mother    Stroke Maternal Grandfather    Hypertension Maternal Grandfather    Heart attack Father    Heart disease Father    Asthma Paternal Uncle        PAT UNCLES   Heart attack Paternal Uncle    Social History   Socioeconomic History   Marital status: Widowed    Spouse name: Not on file   Number of children: Not on file   Years of education: Not on file   Highest education level: Not on file  Occupational History   Not on file  Tobacco Use   Smoking status: Never    Passive exposure: Never   Smokeless tobacco: Never  Vaping Use   Vaping status: Never Used  Substance and Sexual Activity   Alcohol use: No    Alcohol/week: 0.0 standard drinks of alcohol   Drug use: No   Sexual activity: Not Currently    Partners: Male    Birth  control/protection: Surgical  Other Topics Concern   Not on file  Social History Narrative   Widowed in 2015   2 sons 1 lives in the area the other is in close contact    1 grandchild   Never smoker no drug use no alcohol   Social Determinants of Health   Financial Resource Strain: Low Risk  (01/11/2022)   Overall Financial Resource Strain (CARDIA)    Difficulty of Paying Living Expenses: Not hard at all  Food Insecurity: No Food Insecurity (01/11/2022)   Hunger Vital Sign    Worried About Running Out of Food in the Last Year: Never true    Ran Out of Food in the Last Year: Never true  Transportation Needs: No Transportation Needs (01/11/2022)   PRAPARE - Administrator, Civil Service (Medical): No    Lack of Transportation (Non-Medical): No  Physical Activity: Inactive (01/11/2022)   Exercise Vital Sign    Days of Exercise per Week: 0 days    Minutes of Exercise per Session: 0 min  Stress: No Stress Concern Present (01/11/2022)   [employer] of Occupational Health - Occupational Stress Questionnaire    Feeling of Stress : Not at all  Social Connections: Socially Integrated (  01/11/2022)   Social Connection and Isolation Panel [NHANES]    Frequency of Communication with Friends and Family: More than three times a week    Frequency of Social Gatherings with Friends and Family: More than three times a week    Attends Religious Services: More than 4 times per year    Active Member of Clubs or Organizations: Yes    Attends Engineer, structural: More than 4 times per year    Marital Status: Married   Past Surgical History:  Procedure Laterality Date   ANKLE SURGERY Left    ligament   APPENDECTOMY  1987   AT [facility]   BACK SURGERY     Fusion   BREAST LUMPECTOMY  2003   radiation on right   BREAST LUMPECTOMY WITH RADIOACTIVE SEED LOCALIZATION Left 02/12/2016   Procedure: LEFT BREAST LUMPECTOMY WITH RADIOACTIVE SEED LOCALIZATION;  Surgeon: Glenna Fellows,  MD;  Location: [facility];  Service: General;  Laterality: Left;   CARDIOVASCULAR STRESS TEST  09/07/05   Nuclear, was negative   CATARACT EXTRACTION Bilateral 01,03   COLONOSCOPY WITH PROPOFOL N/A 05/04/2021   Procedure: COLONOSCOPY WITH PROPOFOL;  Surgeon: Iva Boop, MD;  Location: [facility];  Service: Endoscopy;  Laterality: N/A;   ESOPHAGOGASTRODUODENOSCOPY (EGD) WITH PROPOFOL N/A 05/04/2021   Procedure: ESOPHAGOGASTRODUODENOSCOPY (EGD) WITH PROPOFOL;  Surgeon: Iva Boop, MD;  Location: [facility];  Service: Endoscopy;  Laterality: N/A;   OOPHORECTOMY     LSO -RSO   PELVIC LAPAROSCOPY  1989   RSO, LYSIS OF ADHESIONS   POLYPECTOMY  05/04/2021   Procedure: POLYPECTOMY;  Surgeon: Iva Boop, MD;  Location: [facility];  Service: Endoscopy;;   TOTAL ABDOMINAL HYSTERECTOMY  1987   LSO, APPENDECTOMY   TOTAL HIP ARTHROPLASTY Right 10/28/2013   dr Ophelia Charter   TOTAL HIP ARTHROPLASTY Right 10/28/2013   Procedure: TOTAL HIP ARTHROPLASTY ANTERIOR APPROACH;  Surgeon: Eldred Manges, MD;  Location: [facility];  Service: Orthopedics;  Laterality: Right;  Right Total Hip Arthroplasty, Direct Anterior Approach   Past Surgical History:  Procedure Laterality Date   ANKLE SURGERY Left    ligament   APPENDECTOMY  1987   AT [facility]   BACK SURGERY     Fusion   BREAST LUMPECTOMY  2003   radiation on right   BREAST LUMPECTOMY WITH RADIOACTIVE SEED LOCALIZATION Left 02/12/2016   Procedure: LEFT BREAST LUMPECTOMY WITH RADIOACTIVE SEED LOCALIZATION;  Surgeon: Glenna Fellows, MD;  Location: [facility];  Service: General;  Laterality: Left;   CARDIOVASCULAR STRESS TEST  09/07/05   Nuclear, was negative   CATARACT EXTRACTION Bilateral 01,03   COLONOSCOPY WITH PROPOFOL N/A 05/04/2021   Procedure: COLONOSCOPY WITH PROPOFOL;  Surgeon: Iva Boop, MD;  Location: [facility];  Service: Endoscopy;  Laterality: N/A;   ESOPHAGOGASTRODUODENOSCOPY (EGD) WITH PROPOFOL N/A  05/04/2021   Procedure: ESOPHAGOGASTRODUODENOSCOPY (EGD) WITH PROPOFOL;  Surgeon: Iva Boop, MD;  Location: [facility];  Service: Endoscopy;  Laterality: N/A;   OOPHORECTOMY     LSO -RSO   PELVIC LAPAROSCOPY  1989   RSO, LYSIS OF ADHESIONS   POLYPECTOMY  05/04/2021   Procedure: POLYPECTOMY;  Surgeon: Iva Boop, MD;  Location: [facility];  Service: Endoscopy;;   TOTAL ABDOMINAL HYSTERECTOMY  1987   LSO, APPENDECTOMY   TOTAL HIP ARTHROPLASTY Right 10/28/2013   dr Ophelia Charter   TOTAL HIP ARTHROPLASTY Right 10/28/2013   Procedure: TOTAL HIP ARTHROPLASTY ANTERIOR APPROACH;  Surgeon: Eldred Manges, MD;  Location: [facility];  Service: Orthopedics;  Laterality: Right;  Right Total Hip Arthroplasty, Direct Anterior Approach   Past Medical History:  Diagnosis Date   Anemia    hx   Arthritis    Asthma    PFTs, February, 2011, moderate obstructive disease with response to bronchodilators, normal lung volumes, moderate reduction in diffusing capacity   Atrial septal aneurysm    Echo, 2008-not noted on 13 echo   CAD (coronary artery disease)    90% distal LAD in the past  /   nuclear, 2008, no ischemia, ejection fraction 70%   Cancer (HCC) 2002   DUCTAL CIS--S/P LUMPECTOMY, RADIATION AND 6 WEEKS OF TAMOXIFEN   D-dimer, elevated    January, 2014   Depression    Ejection fraction    EF 60%, echo, October, 2008   Elevated CPK    January, 2014   Endometriosis 1989   RIGHT TUBE   Endometriosis 1987   LEFT TUBE/OVARY W FOCAL IN-SITU ENDOMETRIAL ADENOCARCINOMA   GERD (gastroesophageal reflux disease)    occ   H/O hiatal hernia    ?   Hyperlipidemia    Hypertension    Hypothyroidism    Patient has had in the past that she does not need treatment   Kyphoscoliosis    Obstructive airway disease (HCC)    Pinched nerve    lower back   Shortness of breath    Echo 2/22: EF 60-65, no RWMA, mild LVH, GR 1  DD, GLS -24%, normal RVSF, mild MR, trivial AI, borderline dilation of aortic root (39 mm)    UTI (lower urinary tract infection)    BP 124/77   Pulse 80   Ht 5' (1.524 m)   Wt 136 lb 6.4 oz (61.9 kg)   SpO2 (!) 84% Comment: has raynauds  BMI 26.64 kg/m   Opioid Risk Score:   Fall Risk Score:  `1  Depression screen [facility] 2/9     11/22/2022    3:06 PM 10/21/2022    2:10 PM 09/13/2022    2:51 PM 08/15/2022   11:52 AM 07/25/2022    8:44 AM 07/25/2022    8:43 AM 06/20/2022    4:17 PM  Depression screen PHQ 2/9  Decreased Interest 0 1 1 1  0 0 0  Down, Depressed, Hopeless 0 1 1 1  0 0 0  PHQ - 2 Score 0 2 2 2  0 0 0  Altered sleeping     2    Tired, decreased energy     3    Change in appetite     0    Feeling bad or failure about yourself      0    Trouble concentrating     0    Moving slowly or fidgety/restless     0    Suicidal thoughts     0    PHQ-9 Score     5    Difficult doing work/chores     Somewhat difficult       Review of Systems  Constitutional: Negative.   HENT: Negative.    Eyes: Negative.   Respiratory: Negative.    Cardiovascular: Negative.   Gastrointestinal: Negative.   Endocrine: Negative.   Genitourinary: Negative.   Musculoskeletal:  Positive for back pain.       Right shoulder and knee  Skin: Negative.   Allergic/Immunologic: Negative.   Neurological: Negative.   Hematological: Negative.   Psychiatric/Behavioral: Negative.    All other systems reviewed  and are negative.      Objective:   Physical Exam Vitals and nursing note reviewed.  Constitutional:      Appearance: Normal appearance.  Cardiovascular:     Rate and Rhythm: Normal rate and regular rhythm.     Pulses: Normal pulses.     Heart sounds: Normal heart sounds.  Pulmonary:     Effort: Pulmonary effort is normal.     Breath sounds: Normal breath sounds.  Musculoskeletal:     Cervical back: Normal range of motion and neck supple.     Comments: Normal Muscle Bulk and Muscle Testing Reveals:  Upper Extremities: Full ROM and Muscle Strength 5/5  Lumbar Paraspinal  Tenderness: L-4-L-5 Lower Extremities: Full ROM and Muscle Strength 5/5 Arises from Table Slowly using walker for support Narrow Based  Gait     Skin:    General: Skin is warm and dry.  Neurological:     Mental Status: She is alert and oriented to person, place, and time.  Psychiatric:        Mood and Affect: Mood normal.        Behavior: Behavior normal.           Assessment & Plan:  1. Lumbar post laminectomy syndrome with severe kyphoscoliosis thoracolumbar spine, s/p lumbar fusion/  11/22/2022 Continue current medication regimen. Continue  Hydrocodone 10/325mg  one tablet every 8 hours #90. We will continue the opioid monitoring program, this consists of regular clinic visits, examinations, urine drug screen, pill counts as well as use of [place] Controlled Substance Reporting system. A 12 month History has been reviewed on the [place] Controlled Substance Reporting System on 11/22/2022 2. Right Hip OA: S/P Right Hip Replacement 10/28/2013. 11/22/2022 3.Depression: Continue Cymbalta. Has Family Support and Friends.  Counseling with Renato Gails. 11/22/2022 4. Bilateral Hip Pain/ Right Greater Trochanteric Tenderness: No complaints today. Continue to Monitor. Continue with Ice and Heat Therapy. 11/22/2022 5. Poor Appetite/ Frail: No complaints today. Ms. Tawney states PCP following. Continue to monitor. 11/22/2022 6. Chronic Bilateral Knee Pain: No complaints today .Continue HEP as Tolerated. Continue to Monitor. 11/22/2022 7. Chronic Right  shoulder pain:  No complaints today.Continue HEP as tolerated. Continue to Monitor. 11/22/2022   F/U in 1 month

## 2022-11-25 NOTE — Progress Notes (Deleted)
  Cardiology Office Note:  .   Date:  11/25/2022  ID:  Hannah Scott, DOB 11-29-1950, MRN [id number] PCP: Pincus Sanes, MD  [facility] Providers Cardiologist:  Meriam Sprague, MD { Click to update primary MD,subspecialty MD or APP then REFRESH:1}   Patient Profile: .      PMH: CAD MI 41 and 1998 secondary to coronary vasospasm Known distal LAD disease Scheduled for coronary CT, unable to gain IV access Myoview 12/2019 no ischemia, low risk Hypertension Hyperlipidemia Atrial septal aneurysm Asthma  Prior patient of Drs. Suzan Garibaldi, she was seen by Dr. Shari Prows 05/2020. Multiple office visits for shortness of breath.  Advised by pulmonology to hold ACE and only use Bystolic has beta-blocker.  Admission to [facility] 08/2021 for septic shock secondary to LE cellulitis and UTI, requiring intubation.  Echo 09/11/2021 with LV function greater than 75, no RWMA.  Statin discontinued due to elevated LFTs.  Troponin was elevated, she was treated with heparin but it was discontinued due to thrombocytopenia.  She was discharged to rehab.  Readmission to [facility] 09/2021 after being found unresponsive and hypoxic to 30s.  She was intubated and treated for bilateral pneumonia.  Appeared volume overloaded on exam and required diuresis.  Echo 09/25/2021 at [facility] with LV function 60-65, normal RV function, and moderate to severe pulmonary hypertension.  RVSP 61 mmHg.    Last cardiology clinic visit was 02/28/22 with Dr. Shari Prows.  Reported stable weight and home BP. LDL elevated at 85.  Advised to start Lipitor 40 mg daily and return in 6 to 8 weeks for lipid monitoring. Benazepril stopped due to cough, tolerating irbesartan 75 mg daily. 6 mo f/u recommended.         History of Present Illness: .   Hannah Scott is a *** 72 y.o. female who is here today for evaluation of shortness of breath and tightness in her neck  ROS: ***       Studies Reviewed: .         *** Risk Assessment/Calculations:   {Does this patient have ATRIAL FIBRILLATION?[phone]} No BP recorded.  {Refresh Note OR Click here to enter BP  :1}***       Physical Exam:   VS:  There were no vitals taken for this visit.   Wt Readings from Last 3 Encounters:  11/22/22 136 lb 6.4 oz (61.9 kg)  10/21/22 135 lb (61.2 kg)  09/13/22 140 lb (63.5 kg)    GEN: Well nourished, well developed in no acute distress NECK: No JVD; No carotid bruits CARDIAC: ***RRR, no murmurs, rubs, gallops RESPIRATORY:  Clear to auscultation without rales, wheezing or rhonchi  ABDOMEN: Soft, non-tender, non-distended EXTREMITIES:  No edema; No deformity     ASSESSMENT AND PLAN: .    CAD: Trialed on amlodipine for possible vasospasm, however no significant change in symptoms and more frequent hypotension.  Chronic HFpEF: Pulmonary hypertension:  Hypertension Hyperlipidemia    {Are you ordering a CV Procedure (e.g. stress test, cath, DCCV, TEE, etc)?   Press F2        :[id number]}  Dispo: ***  Signed, Eligha Bridegroom, NP-C

## 2022-11-29 ENCOUNTER — Ambulatory Visit: Payer: Medicare Other | Admitting: Nurse Practitioner

## 2022-12-03 ENCOUNTER — Other Ambulatory Visit: Payer: Self-pay | Admitting: Registered Nurse

## 2022-12-05 NOTE — Telephone Encounter (Signed)
PMP was reviewed: Gabapentin e-scribed today.  Call placed to Ms. Coxl regarding the above she verbalizes understanding.

## 2022-12-05 NOTE — Telephone Encounter (Signed)
PMP was Reviewed.  

## 2022-12-15 NOTE — Progress Notes (Signed)
Subjective:    Patient ID: Hannah Scott, female    DOB: 04-Sep-1950, 72 y.o.   MRN: [id number]      HPI Hannah Scott is here for  Chief Complaint  Patient presents with   Joint Swelling    Swelling in both ankles and SOB     Swollen ankles and DOE - started a month or so ago.  She gets SOB doing ADL's and ambulating.  Ankles have been swollen.    Taking torsemide 10 mg daily - one week ago took 20 mg daily x 2 days and it helped a little - then went back to the 10 mg daily.  Tried hydrochlorothiazide and it helped more than the torsemide - she urinated more.  She still feels short of breath.   Saw pulm today -  walk test with dec O2 - to order oxygen.    Echo 12/23 - EF 60-65%, grade 1 DD, mod pulm htn, mild MR, mild AR, mod PR    BP at home 101/63  89,   140/83   86 147/81   81,       123/69   70 117/68   80   135/76  85    Medications and allergies reviewed with patient and updated if appropriate.  Current Outpatient Medications on File Prior to Visit  Medication Sig Dispense Refill   albuterol (VENTOLIN HFA) 108 (90 Base) MCG/ACT inhaler Inhale 2 puffs into the lungs every 4 (four) hours as needed for wheezing or shortness of breath. 1 each 11   atorvastatin (LIPITOR) 40 MG tablet Take 1 tablet (40 mg total) by mouth daily. 90 tablet 2   bisoprolol (ZEBETA) 5 MG tablet TAKE 1 TABLET BY MOUTH DAILY 90 tablet 1   BREO ELLIPTA 100-25 MCG/ACT AEPB INHALE 1 PUFF INTO THE LUNGS DAILY 60 each 11   clobetasol cream (TEMOVATE) 0.05 % Apply 1 Application topically 2 (two) times daily. 30 g 2   DULoxetine (CYMBALTA) 30 MG capsule TAKE ONE CAPSULE EVERY DAY IN ADDITION TO 60MG  CAPSULE FOR A TOTAL OF 90MG  DAILY 90 capsule 1   DULoxetine (CYMBALTA) 60 MG capsule TAKE ONE CAPSULE BY MOUTH EVERY DAY TAKE ALONG WITH 30MG  CAPSULE. TOTAL DAILY DOSE is 90MG . 90 capsule 1   famotidine (PEPCID) 20 MG tablet TAKE ONE TABLET EVERY DAY AFTER SUPPER 90 tablet 2   gabapentin (NEURONTIN) 600 MG  tablet TAKE ONE TABLET BY MOUTH EVERY DAY AT BEDTIME 90 tablet 1   HYDROcodone-acetaminophen (NORCO) 10-325 MG tablet Take 1 tablet by mouth 3 (three) times daily as needed. 90 tablet 0   ipratropium-albuterol (DUONEB) 0.5-2.5 (3) MG/3ML SOLN Take 3 mLs by nebulization every 4 (four) hours as needed (wheezing or SOB). During exacerbation 360 mL 1   irbesartan (AVAPRO) 75 MG tablet TAKE 1 TABLET(75 MG) BY MOUTH DAILY 90 tablet 1   levothyroxine (SYNTHROID) 100 MCG tablet TAKE 1 TABLET(100 MCG) BY MOUTH DAILY 90 tablet 3   torsemide (DEMADEX) 10 MG tablet Take 1 tablet (10 mg total) by mouth daily. Must keep 01/24/23 appt for future refills 90 tablet 0   traZODone (DESYREL) 50 MG tablet Take 0.5-1 tablets (25-50 mg total) by mouth at bedtime. 30 tablet 5   aspirin EC 81 MG tablet Take 1 tablet (81 mg total) by mouth daily. Swallow whole. (Patient not taking: Reported on 12/16/2022) 90 tablet 3   No current facility-administered medications on file prior to visit.    Review of Systems  Constitutional:  Positive for chills (occ). Negative for fever.  Respiratory:  Positive for cough (occ), shortness of breath and wheezing (occ).   Cardiovascular:  Positive for leg swelling. Negative for chest pain and palpitations.       Gets tightness in neck when she gets SOB - improves with rest  Neurological:  Positive for light-headedness (with low BP) and headaches (occ).       Objective:   Vitals:   12/16/22 1346  BP: 136/80  Pulse: 93  Temp: 97.9 F (36.6 C)  SpO2: 95%   BP Readings from Last 3 Encounters:  12/16/22 136/80  12/16/22 134/84  11/22/22 124/77   Wt Readings from Last 3 Encounters:  12/16/22 136 lb 6.4 oz (61.9 kg)  12/16/22 141 lb 6.4 oz (64.1 kg)  11/22/22 136 lb 6.4 oz (61.9 kg)   Body mass index is 27.55 kg/m.    Physical Exam Constitutional:      General: She is not in acute distress.    Appearance: Normal appearance.  HENT:     Head: Normocephalic and atraumatic.   Eyes:     Conjunctiva/sclera: Conjunctivae normal.  Cardiovascular:     Rate and Rhythm: Normal rate and regular rhythm.     Heart sounds: Normal heart sounds.  Pulmonary:     Effort: Pulmonary effort is normal. No respiratory distress.     Breath sounds: Normal breath sounds. No wheezing.  Musculoskeletal:        General: Deformity (Scoliosis) present.     Cervical back: Neck supple.     Right lower leg: Edema (Mild) present.     Left lower leg: Edema (Mild) present.  Lymphadenopathy:     Cervical: No cervical adenopathy.  Skin:    General: Skin is warm and dry.     Findings: Erythema (Mild erythema right lower extremity that looks like venous stasis dermatitis, erythema left lower extremity, especially medial aspect there is more significant-she states is not usually that red) present. No rash.  Neurological:     Mental Status: She is alert. Mental status is at baseline.  Psychiatric:        Mood and Affect: Mood normal.        Behavior: Behavior normal.            Assessment & Plan:    See Problem List for Assessment and Plan of chronic medical problems.

## 2022-12-16 ENCOUNTER — Ambulatory Visit (INDEPENDENT_AMBULATORY_CARE_PROVIDER_SITE_OTHER): Payer: Medicare Other

## 2022-12-16 ENCOUNTER — Encounter: Payer: Self-pay | Admitting: Internal Medicine

## 2022-12-16 ENCOUNTER — Encounter: Payer: Self-pay | Admitting: Emergency Medicine

## 2022-12-16 ENCOUNTER — Ambulatory Visit: Payer: Medicare Other | Admitting: Emergency Medicine

## 2022-12-16 ENCOUNTER — Ambulatory Visit (INDEPENDENT_AMBULATORY_CARE_PROVIDER_SITE_OTHER): Payer: Medicare Other | Admitting: Internal Medicine

## 2022-12-16 VITALS — BP 134/84 | HR 98 | Ht 59.0 in | Wt 141.4 lb

## 2022-12-16 VITALS — BP 136/80 | HR 93 | Temp 97.9°F | Ht 59.0 in | Wt 136.4 lb

## 2022-12-16 DIAGNOSIS — E538 Deficiency of other specified B group vitamins: Secondary | ICD-10-CM | POA: Diagnosis not present

## 2022-12-16 DIAGNOSIS — I10 Essential (primary) hypertension: Secondary | ICD-10-CM

## 2022-12-16 DIAGNOSIS — E039 Hypothyroidism, unspecified: Secondary | ICD-10-CM

## 2022-12-16 DIAGNOSIS — R0609 Other forms of dyspnea: Secondary | ICD-10-CM

## 2022-12-16 DIAGNOSIS — R9389 Abnormal findings on diagnostic imaging of other specified body structures: Secondary | ICD-10-CM | POA: Diagnosis not present

## 2022-12-16 DIAGNOSIS — J4540 Moderate persistent asthma, uncomplicated: Secondary | ICD-10-CM | POA: Diagnosis not present

## 2022-12-16 DIAGNOSIS — I5032 Chronic diastolic (congestive) heart failure: Secondary | ICD-10-CM

## 2022-12-16 DIAGNOSIS — R252 Cramp and spasm: Secondary | ICD-10-CM

## 2022-12-16 DIAGNOSIS — R0602 Shortness of breath: Secondary | ICD-10-CM | POA: Diagnosis not present

## 2022-12-16 LAB — BASIC METABOLIC PANEL
BUN: 17 mg/dL (ref 6–23)
CO2: 35 mEq/L — ABNORMAL HIGH (ref 19–32)
Calcium: 9.3 mg/dL (ref 8.4–10.5)
Chloride: 99 mEq/L (ref 96–112)
Creatinine, Ser: 0.96 mg/dL (ref 0.40–1.20)
GFR: 58.76 mL/min — ABNORMAL LOW (ref >60.00)
Glucose, Bld: 74 mg/dL (ref 70–99)
Potassium: 4.2 mEq/L (ref 3.5–5.1)
Sodium: 143 mEq/L (ref 135–145)

## 2022-12-16 LAB — MAGNESIUM: Magnesium: 2 mg/dL (ref 1.5–2.5)

## 2022-12-16 LAB — BRAIN NATRIURETIC PEPTIDE: Pro B Natriuretic peptide (BNP): 1285 pg/mL — ABNORMAL HIGH (ref 0.0–100.0)

## 2022-12-16 LAB — TSH: TSH: 0.11 u[IU]/mL — ABNORMAL LOW (ref 0.35–5.50)

## 2022-12-16 MED ORDER — CYANOCOBALAMIN 1000 MCG/ML IJ SOLN
1000.0000 ug | Freq: Once | INTRAMUSCULAR | Status: AC
Start: 2022-12-16 — End: 2022-12-16
  Administered 2022-12-16: 1000 ug via INTRAMUSCULAR

## 2022-12-16 MED ORDER — FAMOTIDINE 20 MG PO TABS
ORAL_TABLET | ORAL | 2 refills | Status: DC
Start: 2022-12-16 — End: 2023-10-16

## 2022-12-16 MED ORDER — FUROSEMIDE 20 MG PO TABS: 20.0000 mg | ORAL_TABLET | Freq: Every day | ORAL | 3 refills | Status: DC

## 2022-12-16 NOTE — Assessment & Plan Note (Signed)
Chronic-worse recently Chronic shortness of breath is likely multifactorial Increase shortness of breath recently possibly related to pulmonary hypertension, diastolic heart failure exacerbation Occasional cough and wheeze-no obvious infection Does require oxygen-just saw pulmonary earlier today and they will be ordering that Looks like they wanted to do a chest x-ray that was not done-will get that done here today Check BMP, BNP, TSH Does appear to be fluid overloaded-switch back to furosemide Furosemide 40 mg daily x 2 days then 20 mg daily Stop torsemide

## 2022-12-16 NOTE — Assessment & Plan Note (Signed)
Chronic Blood pressure overall controlled-slightly variable BMP Continue bisoprolol 5 mg daily, torsemide 10 mg daily, irbesartan 75 mg daily

## 2022-12-16 NOTE — Patient Instructions (Addendum)
    B12 injection today   Have a chest xray today.     Blood work was ordered.       Medications changes include :   take 40 mg of furosemide daily x 2 days then 20 mg daily.  Stop torsemide.      Increase your fluid intake.

## 2022-12-16 NOTE — Patient Instructions (Addendum)
Chest x-ray today We will repeat a walking oximetry today to see if you qualify for oxygen with exertion Continue Breo once daily.  Rinse and gargle after using. Keep albuterol available use 2 puffs if needed for shortness of breath, chest tightness, wheezing. Agree with cardiology follow-up, particularly since your diuretics has improved your breathing at times Follow with APP in 3 months for Hannah Scott in 6 months, sooner if you have problems.

## 2022-12-16 NOTE — Assessment & Plan Note (Signed)
Activity is quite limited.  Suspect that this is multifactorial.  Her symptoms are not reminiscent of her previous active asthma, no wheeze, no cough.  Intermittent response to.  She is deconditioned, has obesity, paralyzed vocal cord, CHF.  Interestingly she has improved at times with extra diuretics.  We will check a chest x-ray today and repeat her walking oximetry to ensure that she does not need her to be back on supplemental oxygen.

## 2022-12-16 NOTE — Progress Notes (Signed)
Subjective:    Patient ID: Hannah Scott, female    DOB: 1950-08-30, 72 y.o.   MRN: [id number]  HPI 72 year old never smoker with a history of childhood asthma, continued into adulthood.  She has been seen by Dr. Sherene Sires in our office for obstructive lung disease, also has GERD, rhinitis. PMH: CAD, ductal breast CIS (lumpectomy, tamoxifen), hypertension, endometriosis, kyphoscoliosis She is referred today for abnormal CT scan of the chest.   She has been ill lately - multiple hospitalizations with sepsis from UTI + cellulitis, volume overload, possible B LL PNA. She required intubation, ventilated 1 day in setting of this as well as symptomatic hypoglycemia and encephalopathy. She is on breo, uses albuterol a few times a month. She is using O2 at 0.5L/min.   She had a CT-PA done at [facility] that identified elevated bilateral hemidiaphragms, some right basilar atelectasis versus small 5 mm nodule.  This was done when she was hospitalized.  She also had a swallowing evaluation.  She was sent home on supplemental oxygen.  CT-PA 09/26/21 at [facility] reviewed by me, shows a tortuous aorta but no mediastinal or hilar adenopathy.  Small bilateral pleural effusions with moderate bibasilar atelectasis more so on the right.  Both hemidiaphragms are somewhat elevated.  There is superior segmental right lower lobe pulmonary nodule versus area of atelectasis that measures 5 mm.   ROV 04/08/22 --follow-up visit 72 year old woman with a history of asthma, GERD, rhinitis, CAD, ductal breast CIS, endometriosis, hypertension.  I saw her in August for an abnormal CT scan of the chest that showed some right basilar atelectasis, possible 5 mm right lower lobe nodule.  We elected to repeat her CT chest which was done 03/28/2022 as below. She is on Breo, uses albuterol rarely. Stable exertional SOB. Using o2 prn for heavier exertion.  Flu shot up to date, needs the covid vaccine.    CT chest 03/28/2022 reviewed by me shows no  evidence of mediastinal or hilar adenopathy, centrilobular emphysematous change.  Some chronic left basilar atelectasis, slightly improved.  Her right lower lobe superior segmental nodular opacity has completely resolved.  No other nodules or masses noted  ROV 12/16/2022.  Hannah Scott is 72 with history of asthma in the setting of GERD and chronic rhinitis.  Also with a history of CAD, ductal breast CIS, endometriosis, hypertension.  I have followed her for her asthma as well as CT chest with some basilar atelectasis, nodules that have resolved.   She returns today reporting she is having increased exertional SOB that has been slowly progressive over months. She has not had cough or wheeze. Some increased albuterol use. She was on O2 in past after a hospitalization, she was able to come off, does not currently have. She believes that she has some fluid retention - takes an extra torsemide on days w more edema - seems to help her SOB. She is hoarse for 2 yrs, has had an ENT eval, has a paralyzed VC.   Review of Systems As per HPI  Past Medical History:  Diagnosis Date   Anemia    hx   Arthritis    Asthma    PFTs, February, 2011, moderate obstructive disease with response to bronchodilators, normal lung volumes, moderate reduction in diffusing capacity   Atrial septal aneurysm    Echo, 2008-not noted on 13 echo   CAD (coronary artery disease)    90% distal LAD in the past  /   nuclear, 2008, no ischemia, ejection fraction  70%   Cancer (HCC) 2002   DUCTAL CIS--S/P LUMPECTOMY, RADIATION AND 6 WEEKS OF TAMOXIFEN   D-dimer, elevated    January, 2014   Depression    Ejection fraction    EF 60%, echo, October, 2008   Elevated CPK    January, 2014   Endometriosis 1989   RIGHT TUBE   Endometriosis 1987   LEFT TUBE/OVARY W FOCAL IN-SITU ENDOMETRIAL ADENOCARCINOMA   GERD (gastroesophageal reflux disease)    occ   H/O hiatal hernia    ?   Hyperlipidemia    Hypertension    Hypothyroidism     Patient has had in the past that she does not need treatment   Kyphoscoliosis    Obstructive airway disease (HCC)    Pinched nerve    lower back   Shortness of breath    Echo 2/22: EF 60-65, no RWMA, mild LVH, GR 1  DD, GLS -24%, normal RVSF, mild MR, trivial AI, borderline dilation of aortic root (39 mm)   UTI (lower urinary tract infection)      Family History  Problem Relation Age of Onset   Heart failure Mother    Pneumonia Mother    Hypertension Mother    Heart disease Mother    Stroke Maternal Grandfather    Hypertension Maternal Grandfather    Heart attack Father    Heart disease Father    Asthma Paternal Uncle        PAT UNCLES   Heart attack Paternal Uncle      Social History   Socioeconomic History   Marital status: Widowed    Spouse name: Not on file   Number of children: Not on file   Years of education: Not on file   Highest education level: Not on file  Occupational History   Not on file  Tobacco Use   Smoking status: Never    Passive exposure: Never   Smokeless tobacco: Never  Vaping Use   Vaping status: Never Used  Substance and Sexual Activity   Alcohol use: No    Alcohol/week: 0.0 standard drinks of alcohol   Drug use: No   Sexual activity: Not Currently    Partners: Male    Birth control/protection: Surgical  Other Topics Concern   Not on file  Social History Narrative   Widowed in 2015   2 sons 1 lives in the area the other is in close contact    1 grandchild   Never smoker no drug use no alcohol   Social Determinants of Health   Financial Resource Strain: Low Risk  (01/11/2022)   Overall Financial Resource Strain (CARDIA)    Difficulty of Paying Living Expenses: Not hard at all  Food Insecurity: No Food Insecurity (01/11/2022)   Hunger Vital Sign    Worried About Running Out of Food in the Last Year: Never true    Ran Out of Food in the Last Year: Never true  Transportation Needs: No Transportation Needs (01/11/2022)   PRAPARE -  Administrator, Civil Service (Medical): No    Lack of Transportation (Non-Medical): No  Physical Activity: Inactive (01/11/2022)   Exercise Vital Sign    Days of Exercise per Week: 0 days    Minutes of Exercise per Session: 0 min  Stress: No Stress Concern Present (01/11/2022)   [employer] of Occupational Health - Occupational Stress Questionnaire    Feeling of Stress : Not at all  Social Connections: Socially Integrated (01/11/2022)  Social Advertising account executive [NHANES]    Frequency of Communication with Friends and Family: More than three times a week    Frequency of Social Gatherings with Friends and Family: More than three times a week    Attends Religious Services: More than 4 times per year    Active Member of [employer] or Organizations: Yes    Attends Engineer, structural: More than 4 times per year    Marital Status: Married  Catering manager Violence: Not At Risk (01/11/2022)   Humiliation, Afraid, Rape, and Kick questionnaire    Fear of Current or Ex-Partner: No    Emotionally Abused: No    Physically Abused: No    Sexually Abused: No     Allergies  Allergen Reactions   Fluticasone-Salmeterol Other (See Comments)    REACTION: headache.   She is tolerating Dulera well.  Other reaction(s): Other (See Comments)  Other Reaction(s): Other (See Comments)  REACTION: headache. , She is tolerating Dulera well.   Norvasc [Amlodipine Besylate] Swelling and Other (See Comments)    edema   Tape Other (See Comments)    Prefers paper tape Other reaction(s): Other (See Comments) Prefers paper tape- "everything else rips my skin"   Heparin    Morphine Nausea Only     Outpatient Medications Prior to Visit  Medication Sig Dispense Refill   albuterol (VENTOLIN HFA) 108 (90 Base) MCG/ACT inhaler Inhale 2 puffs into the lungs every 4 (four) hours as needed for wheezing or shortness of breath. 1 each 11   atorvastatin (LIPITOR) 40 MG tablet Take 1  tablet (40 mg total) by mouth daily. 90 tablet 2   bisoprolol (ZEBETA) 5 MG tablet TAKE 1 TABLET BY MOUTH DAILY 90 tablet 1   BREO ELLIPTA 100-25 MCG/ACT AEPB INHALE 1 PUFF INTO THE LUNGS DAILY 60 each 11   clobetasol cream (TEMOVATE) 0.05 % Apply 1 Application topically 2 (two) times daily. 30 g 2   DULoxetine (CYMBALTA) 30 MG capsule TAKE ONE CAPSULE EVERY DAY IN ADDITION TO 60MG  CAPSULE FOR A TOTAL OF 90MG  DAILY 90 capsule 1   DULoxetine (CYMBALTA) 60 MG capsule TAKE ONE CAPSULE BY MOUTH EVERY DAY TAKE ALONG WITH 30MG  CAPSULE. TOTAL DAILY DOSE is 90MG . 90 capsule 1   gabapentin (NEURONTIN) 600 MG tablet TAKE ONE TABLET BY MOUTH EVERY DAY AT BEDTIME 90 tablet 1   HYDROcodone-acetaminophen (NORCO) 10-325 MG tablet Take 1 tablet by mouth 3 (three) times daily as needed. 90 tablet 0   ipratropium-albuterol (DUONEB) 0.5-2.5 (3) MG/3ML SOLN Take 3 mLs by nebulization every 4 (four) hours as needed (wheezing or SOB). During exacerbation 360 mL 1   irbesartan (AVAPRO) 75 MG tablet TAKE 1 TABLET(75 MG) BY MOUTH DAILY 90 tablet 1   levothyroxine (SYNTHROID) 100 MCG tablet TAKE 1 TABLET(100 MCG) BY MOUTH DAILY 90 tablet 3   torsemide (DEMADEX) 10 MG tablet Take 1 tablet (10 mg total) by mouth daily. Must keep 01/24/23 appt for future refills 90 tablet 0   aspirin EC 81 MG tablet Take 1 tablet (81 mg total) by mouth daily. Swallow whole. (Patient not taking: Reported on 12/16/2022) 90 tablet 3   famotidine (PEPCID) 20 MG tablet TAKE ONE TABLET EVERY DAY AFTER SUPPER (Patient not taking: Reported on 12/16/2022) 90 tablet 2   traZODone (DESYREL) 50 MG tablet Take 0.5-1 tablets (25-50 mg total) by mouth at bedtime. (Patient not taking: Reported on 12/16/2022) 30 tablet 5   No facility-administered medications prior to visit.  Objective:   Physical Exam Vitals:   12/16/22 0920  BP: 134/84  Pulse: 98  SpO2: 93%  Weight: 141 lb 6.4 oz (64.1 kg)  Height: 4\' 11"  (1.499 m)     Gen: Pleasant, obese  elderly woman, in no distress,  normal affect  ENT: No lesions,  mouth clear,  oropharynx clear, no postnasal drip  Neck: No JVD, some hoarse voice without stridor  Lungs: No use of accessory muscles, decreased to both bases but no crackles or wheezes  Cardiovascular: RRR, heart sounds normal, no murmur or gallops, no peripheral edema  Musculoskeletal: Severe kyphoscoliosis  Neuro: alert, awake, non focal  Skin: Warm, no lesions or rash      Assessment & Plan:   DOE (dyspnea on exertion) Activity is quite limited.  Suspect that this is multifactorial.  Her symptoms are not reminiscent of her previous active asthma, no wheeze, no cough.  Intermittent response to.  She is deconditioned, has obesity, paralyzed vocal cord, CHF.  Interestingly she has improved at times with extra diuretics.  We will check a chest x-ray today and repeat her walking oximetry to ensure that she does not need her to be back on supplemental oxygen.  Moderate persistent asthma Seems to be well-controlled even though she does have Anoro aggressive dyspnea.  Plan to continue same regimen.    Levy Pupa, MD, PhD 12/16/2022, 9:43 AM [facility] [phone] or if no answer before 7:00PM call [phone] For any issues after 7:00PM please call eLink [phone]

## 2022-12-16 NOTE — Assessment & Plan Note (Signed)
Chronic  Experiencing increased fatigue-concern for hypothyroid Check tsh and will titrate med dose if needed Currently taking levothyroxine 100 mcg daily

## 2022-12-16 NOTE — Assessment & Plan Note (Signed)
Has been experiencing some leg cramping Increase fluids-not drinking enough Check magnesium level, BMP

## 2022-12-16 NOTE — Assessment & Plan Note (Signed)
Seems to be well-controlled even though she does have Anoro aggressive dyspnea.  Plan to continue same regimen.

## 2022-12-16 NOTE — Addendum Note (Signed)
Addended by: Glynda Jaeger on: 12/16/2022 10:17 AM   Modules accepted: Orders

## 2022-12-16 NOTE — Assessment & Plan Note (Signed)
Chronic ?Continue monthly B12 injections ?B12 injection today ?

## 2022-12-22 DIAGNOSIS — R0609 Other forms of dyspnea: Secondary | ICD-10-CM | POA: Diagnosis not present

## 2022-12-22 DIAGNOSIS — J4540 Moderate persistent asthma, uncomplicated: Secondary | ICD-10-CM | POA: Diagnosis not present

## 2023-01-03 ENCOUNTER — Encounter: Payer: Self-pay | Admitting: Registered Nurse

## 2023-01-03 ENCOUNTER — Encounter: Payer: Medicare Other | Attending: Registered Nurse | Admitting: Registered Nurse

## 2023-01-03 VITALS — BP 124/82 | HR 79 | Ht 59.0 in | Wt 136.4 lb

## 2023-01-03 DIAGNOSIS — G894 Chronic pain syndrome: Secondary | ICD-10-CM | POA: Insufficient documentation

## 2023-01-03 DIAGNOSIS — Z5181 Encounter for therapeutic drug level monitoring: Secondary | ICD-10-CM | POA: Insufficient documentation

## 2023-01-03 DIAGNOSIS — Z79891 Long term (current) use of opiate analgesic: Secondary | ICD-10-CM | POA: Insufficient documentation

## 2023-01-03 DIAGNOSIS — M24551 Contracture, right hip: Secondary | ICD-10-CM | POA: Diagnosis not present

## 2023-01-03 DIAGNOSIS — M48062 Spinal stenosis, lumbar region with neurogenic claudication: Secondary | ICD-10-CM | POA: Diagnosis not present

## 2023-01-03 MED ORDER — HYDROCODONE-ACETAMINOPHEN 10-325 MG PO TABS: 1.0000 | ORAL_TABLET | Freq: Three times a day (TID) | ORAL | 0 refills | Status: DC | PRN

## 2023-01-03 NOTE — Progress Notes (Signed)
Subjective:    Patient ID: Hannah Scott, female    DOB: [birth year], 71 y.o.   MRN: [id number]  HPI: Hannah Scott is a 72 y.o. female who returns for follow up appointment for chronic pain and medication refill. She states her pain is located in her lower back. She rates her pain 7. Her current exercise regime is walking short distances with her walker.   [name] Morphine equivalent is 30.00 MME.   Oral Swab was Performed Today.     Pain Inventory Average Pain 6 Pain Right Now 7 My pain is constant, burning, tingling, and aching  In the last 24 hours, has pain interfered with the following? General activity 10 Relation with others 10 Enjoyment of life 10 What TIME of day is your pain at its worst? morning , daytime, evening, and night Sleep (in general) Poor  Pain is worse with: walking and standing Pain improves with: rest and medication Relief from Meds: 5  Family History  Problem Relation Age of Onset  . Heart failure Mother   . Pneumonia Mother   . Hypertension Mother   . Heart disease Mother   . Stroke Maternal Grandfather   . Hypertension Maternal Grandfather   . Heart attack Father   . Heart disease Father   . Asthma Paternal Uncle        PAT UNCLES  . Heart attack Paternal Uncle    Social History   Socioeconomic History  . Marital status: Widowed    Spouse name: Not on file  . Number of children: Not on file  . Years of education: Not on file  . Highest education level: Not on file  Occupational History  . Not on file  Tobacco Use  . Smoking status: Never    Passive exposure: Never  . Smokeless tobacco: Never  Vaping Use  . Vaping status: Never Used  Substance and Sexual Activity  . Alcohol use: No    Alcohol/week: 0.0 standard drinks of alcohol  . Drug use: No  . Sexual activity: Not Currently    Partners: Male    Birth control/protection: Surgical  Other Topics Concern  . Not on file  Social History Narrative   Widowed in 2015   2 sons 1  lives in the area the other is in close contact    1 grandchild   Never smoker no drug use no alcohol   Social Determinants of Health   Financial Resource Strain: Low Risk  (01/11/2022)   Overall Financial Resource Strain (CARDIA)   . Difficulty of Paying Living Expenses: Not hard at all  Food Insecurity: No Food Insecurity (01/11/2022)   Hunger Vital Sign   . Worried About Programme researcher, broadcasting/film/video in the Last Year: Never true   . Ran Out of Food in the Last Year: Never true  Transportation Needs: No Transportation Needs (01/11/2022)   PRAPARE - Transportation   . Lack of Transportation (Medical): No   . Lack of Transportation (Non-Medical): No  Physical Activity: Inactive (01/11/2022)   Exercise Vital Sign   . Days of Exercise per Week: 0 days   . Minutes of Exercise per Session: 0 min  Stress: No Stress Concern Present (01/11/2022)   [employer] of Occupational Health - Occupational Stress Questionnaire   . Feeling of Stress : Not at all  Social Connections: Socially Integrated (01/11/2022)   Social Connection and Isolation Panel [NHANES]   . Frequency of Communication with Friends and Family: More than  three times a week   . Frequency of Social Gatherings with Friends and Family: More than three times a week   . Attends Religious Services: More than 4 times per year   . Active Member of Clubs or Organizations: Yes   . Attends Banker Meetings: More than 4 times per year   . Marital Status: Married   Past Surgical History:  Procedure Laterality Date  . ANKLE SURGERY Left    ligament  . APPENDECTOMY  1987   AT [facility]  . BACK SURGERY     Fusion  . BREAST LUMPECTOMY  2003   radiation on right  . BREAST LUMPECTOMY WITH RADIOACTIVE SEED LOCALIZATION Left 02/12/2016   Procedure: LEFT BREAST LUMPECTOMY WITH RADIOACTIVE SEED LOCALIZATION;  Surgeon: Glenna Fellows, MD;  Location: [facility];  Service: General;  Laterality: Left;  . CARDIOVASCULAR STRESS  TEST  09/07/05   Nuclear, was negative  . CATARACT EXTRACTION Bilateral 01,03  . COLONOSCOPY WITH PROPOFOL N/A 05/04/2021   Procedure: COLONOSCOPY WITH PROPOFOL;  Surgeon: Iva Boop, MD;  Location: [facility];  Service: Endoscopy;  Laterality: N/A;  . ESOPHAGOGASTRODUODENOSCOPY (EGD) WITH PROPOFOL N/A 05/04/2021   Procedure: ESOPHAGOGASTRODUODENOSCOPY (EGD) WITH PROPOFOL;  Surgeon: Iva Boop, MD;  Location: [facility];  Service: Endoscopy;  Laterality: N/A;  . OOPHORECTOMY     LSO -RSO  . PELVIC LAPAROSCOPY  1989   RSO, LYSIS OF ADHESIONS  . POLYPECTOMY  05/04/2021   Procedure: POLYPECTOMY;  Surgeon: Iva Boop, MD;  Location: [facility];  Service: Endoscopy;;  . TOTAL ABDOMINAL HYSTERECTOMY  1987   LSO, APPENDECTOMY  . TOTAL HIP ARTHROPLASTY Right 10/28/2013   dr Ophelia Charter  . TOTAL HIP ARTHROPLASTY Right 10/28/2013   Procedure: TOTAL HIP ARTHROPLASTY ANTERIOR APPROACH;  Surgeon: Eldred Manges, MD;  Location: [facility];  Service: Orthopedics;  Laterality: Right;  Right Total Hip Arthroplasty, Direct Anterior Approach   Past Surgical History:  Procedure Laterality Date  . ANKLE SURGERY Left    ligament  . APPENDECTOMY  1987   AT [facility]  . BACK SURGERY     Fusion  . BREAST LUMPECTOMY  2003   radiation on right  . BREAST LUMPECTOMY WITH RADIOACTIVE SEED LOCALIZATION Left 02/12/2016   Procedure: LEFT BREAST LUMPECTOMY WITH RADIOACTIVE SEED LOCALIZATION;  Surgeon: Glenna Fellows, MD;  Location:  SURGERY CENTER;  Service: General;  Laterality: Left;  . CARDIOVASCULAR STRESS TEST  09/07/05   Nuclear, was negative  . CATARACT EXTRACTION Bilateral 01,03  . COLONOSCOPY WITH PROPOFOL N/A 05/04/2021   Procedure: COLONOSCOPY WITH PROPOFOL;  Surgeon: Iva Boop, MD;  Location: [facility];  Service: Endoscopy;  Laterality: N/A;  . ESOPHAGOGASTRODUODENOSCOPY (EGD) WITH PROPOFOL N/A 05/04/2021   Procedure: ESOPHAGOGASTRODUODENOSCOPY (EGD) WITH PROPOFOL;  Surgeon: Iva Boop, MD;  Location: [facility];  Service: Endoscopy;  Laterality: N/A;  . OOPHORECTOMY     LSO -RSO  . PELVIC LAPAROSCOPY  1989   RSO, LYSIS OF ADHESIONS  . POLYPECTOMY  05/04/2021   Procedure: POLYPECTOMY;  Surgeon: Iva Boop, MD;  Location: [facility];  Service: Endoscopy;;  . TOTAL ABDOMINAL HYSTERECTOMY  1987   LSO, APPENDECTOMY  . TOTAL HIP ARTHROPLASTY Right 10/28/2013   dr Ophelia Charter  . TOTAL HIP ARTHROPLASTY Right 10/28/2013   Procedure: TOTAL HIP ARTHROPLASTY ANTERIOR APPROACH;  Surgeon: Eldred Manges, MD;  Location: [facility];  Service: Orthopedics;  Laterality: Right;  Right Total Hip Arthroplasty, Direct Anterior Approach   Past  Medical History:  Diagnosis Date  . Anemia    hx  . Arthritis   . Asthma    PFTs, February, 2011, moderate obstructive disease with response to bronchodilators, normal lung volumes, moderate reduction in diffusing capacity  . Atrial septal aneurysm    Echo, 2008-not noted on 13 echo  . CAD (coronary artery disease)    90% distal LAD in the past  /   nuclear, 2008, no ischemia, ejection fraction 70%  . Cancer (HCC) 2002   DUCTAL CIS--S/P LUMPECTOMY, RADIATION AND 6 WEEKS OF TAMOXIFEN  . D-dimer, elevated    January, 2014  . Depression   . Ejection fraction    EF 60%, echo, October, 2008  . Elevated CPK    January, 2014  . Endometriosis 1989   RIGHT TUBE  . Endometriosis 1987   LEFT TUBE/OVARY W FOCAL IN-SITU ENDOMETRIAL ADENOCARCINOMA  . GERD (gastroesophageal reflux disease)    occ  . H/O hiatal hernia    ?  [name] Hyperlipidemia   . Hypertension   . Hypothyroidism    Patient has had in the past that she does not need treatment  . Kyphoscoliosis   . Obstructive airway disease (HCC)   . Pinched nerve    lower back  . Shortness of breath    Echo 2/22: EF 60-65, no RWMA, mild LVH, GR 1  DD, GLS -24%, normal RVSF, mild MR, trivial AI, borderline dilation of aortic root (39 mm)  . UTI (lower urinary tract infection)    BP (!) 155/96    Pulse 79   Ht 4\' 11"  (1.499 m)   Wt 136 lb 6.4 oz (61.9 kg)   SpO2 90%   BMI 27.55 kg/m   Opioid Risk Score:   Fall Risk Score:  `1  Depression screen [facility] 2/9     01/03/2023    2:31 PM 11/22/2022    3:06 PM 10/21/2022    2:10 PM 09/13/2022    2:51 PM 08/15/2022   11:52 AM 07/25/2022    8:44 AM 07/25/2022    8:43 AM  Depression screen PHQ 2/9  Decreased Interest 1 0 1 1 1  0 0  Down, Depressed, Hopeless 1 0 1 1 1  0 0  PHQ - 2 Score 2 0 2 2 2  0 0  Altered sleeping      2   Tired, decreased energy      3   Change in appetite      0   Feeling bad or failure about yourself       0   Trouble concentrating      0   Moving slowly or fidgety/restless      0   Suicidal thoughts      0   PHQ-9 Score      5   Difficult doing work/chores      Somewhat difficult      Review of Systems  Constitutional: Negative.   HENT: Negative.    Eyes: Negative.   Respiratory: Negative.    Cardiovascular: Negative.   Gastrointestinal: Negative.   Endocrine: Negative.   Genitourinary: Negative.   Musculoskeletal:  Positive for back pain.  Skin: Negative.   Allergic/Immunologic: Negative.   Neurological: Negative.   Hematological: Negative.   Psychiatric/Behavioral:  Positive for dysphoric mood.   All other systems reviewed and are negative.      Objective:   Physical Exam Vitals and nursing note reviewed.  Constitutional:      Appearance: Normal appearance.  Cardiovascular:     Rate and Rhythm: Normal rate and regular rhythm.     Pulses: Normal pulses.     Heart sounds: Normal heart sounds.  Pulmonary:     Effort: Pulmonary effort is normal.     Breath sounds: Normal breath sounds.  Musculoskeletal:     Cervical back: Normal range of motion and neck supple.     Comments: Normal Muscle Bulk and Muscle Testing Reveals:  Upper Extremities: Full ROM and Muscle Strength 5/5 Lumbar Paraspinal tenderness: L-4-L-5 Lower Extremities: Full ROM and Muscle Strength 5/5 Arises from Table slowly  using walker for support Antalgic  Gait     Skin:    General: Skin is warm and dry.  Neurological:     Mental Status: She is alert and oriented to person, place, and time.  Psychiatric:        Mood and Affect: Mood normal.        Behavior: Behavior normal.         Assessment & Plan:  1. Lumbar post laminectomy syndrome with severe kyphoscoliosis thoracolumbar spine, s/p lumbar fusion/  01/03/2023 Continue current medication regimen. Continue  Hydrocodone 10/325mg  one tablet every 8 hours #90. We will continue the opioid monitoring program, this consists of regular clinic visits, examinations, urine drug screen, pill counts as well as use of [place] Controlled Substance Reporting system. A 12 month History has been reviewed on the [place] Controlled Substance Reporting System on 01/03/2023 2. Right Hip OA: S/P Right Hip Replacement 10/28/2013. 01/03/2023 3.Depression: Continue Cymbalta. Has Family Support and Friends.  Counseling with Renato Gails. 01/03/2023 4. Bilateral Hip Pain/ Right Greater Trochanteric Tenderness: No complaints today. Continue to Monitor. Continue with Ice and Heat Therapy. 01/03/2023 5. Poor Appetite/ Frail: No complaints today. Ms. Himmelman states PCP following. Continue to monitor. 01/03/2023 6. Chronic Bilateral Knee Pain: No complaints today .Continue HEP as Tolerated. Continue to Monitor. 01/03/2023 7. Chronic Right  shoulder pain:  No complaints today.Continue HEP as tolerated. Continue to Monitor. 01/03/2023   F/U in 1 month

## 2023-01-05 ENCOUNTER — Other Ambulatory Visit: Payer: Self-pay | Admitting: Internal Medicine

## 2023-01-05 ENCOUNTER — Other Ambulatory Visit: Payer: Self-pay

## 2023-01-05 DIAGNOSIS — I272 Pulmonary hypertension, unspecified: Secondary | ICD-10-CM

## 2023-01-05 DIAGNOSIS — I25119 Atherosclerotic heart disease of native coronary artery with unspecified angina pectoris: Secondary | ICD-10-CM

## 2023-01-05 DIAGNOSIS — Z79899 Other long term (current) drug therapy: Secondary | ICD-10-CM

## 2023-01-05 DIAGNOSIS — E782 Mixed hyperlipidemia: Secondary | ICD-10-CM

## 2023-01-05 MED ORDER — ATORVASTATIN CALCIUM 40 MG PO TABS
40.0000 mg | ORAL_TABLET | Freq: Every day | ORAL | 0 refills | Status: DC
Start: 2023-01-05 — End: 2023-05-02

## 2023-01-07 LAB — DRUG TOX MONITOR 1 W/CONF, ORAL FLD
Amphetamines: NEGATIVE ng/mL (ref <10)
Barbiturates: NEGATIVE ng/mL (ref <10)
Benzodiazepines: NEGATIVE ng/mL (ref <0.50)
Buprenorphine: NEGATIVE ng/mL (ref <0.10)
Cocaine: NEGATIVE ng/mL (ref <5.0)
Codeine: NEGATIVE ng/mL (ref <2.5)
Dihydrocodeine: 6 ng/mL — ABNORMAL HIGH (ref <2.5)
Fentanyl: NEGATIVE ng/mL (ref <0.10)
Heroin Metabolite: NEGATIVE ng/mL (ref <1.0)
Hydrocodone: 38.6 ng/mL — ABNORMAL HIGH (ref <2.5)
Hydromorphone: NEGATIVE ng/mL (ref <2.5)
MARIJUANA: NEGATIVE ng/mL (ref <2.5)
MDMA: NEGATIVE ng/mL (ref <10)
Meprobamate: NEGATIVE ng/mL (ref <2.5)
Methadone: NEGATIVE ng/mL (ref <5.0)
Morphine: NEGATIVE ng/mL (ref <2.5)
Nicotine Metabolite: NEGATIVE ng/mL (ref <5.0)
Norhydrocodone: NEGATIVE ng/mL (ref <2.5)
Noroxycodone: NEGATIVE ng/mL (ref <2.5)
Opiates: POSITIVE ng/mL — AB (ref <2.5)
Oxycodone: NEGATIVE ng/mL (ref <2.5)
Oxymorphone: NEGATIVE ng/mL (ref <2.5)
Phencyclidine: NEGATIVE ng/mL (ref <10)
Tapentadol: NEGATIVE ng/mL (ref <5.0)
Tramadol: NEGATIVE ng/mL (ref <5.0)
Zolpidem: NEGATIVE ng/mL (ref <5.0)

## 2023-01-07 LAB — DRUG TOX ALC METAB W/CON, ORAL FLD: Alcohol Metabolite: NEGATIVE ng/mL (ref <25)

## 2023-01-17 ENCOUNTER — Other Ambulatory Visit: Payer: Self-pay | Admitting: Internal Medicine

## 2023-01-22 DIAGNOSIS — J4540 Moderate persistent asthma, uncomplicated: Secondary | ICD-10-CM | POA: Diagnosis not present

## 2023-01-22 DIAGNOSIS — R0609 Other forms of dyspnea: Secondary | ICD-10-CM | POA: Diagnosis not present

## 2023-01-23 NOTE — Progress Notes (Signed)
Subjective:    Patient ID: Hannah Scott, female    DOB: 07-Apr-1951, 72 y.o.   MRN: [id number]      HPI Hannah Scott is here for a Physical exam and her chronic medical problems.   ?  Want to take OP medication   Medications and allergies reviewed with patient and updated if appropriate.  Current Outpatient Medications on File Prior to Visit  Medication Sig Dispense Refill  . albuterol (VENTOLIN HFA) 108 (90 Base) MCG/ACT inhaler Inhale 2 puffs into the lungs every 4 (four) hours as needed for wheezing or shortness of breath. 1 each 11  . aspirin EC 81 MG tablet Take 1 tablet (81 mg total) by mouth daily. Swallow whole. 90 tablet 3  . atorvastatin (LIPITOR) 40 MG tablet Take 1 tablet (40 mg total) by mouth daily. 90 tablet 0  . bisoprolol (ZEBETA) 5 MG tablet TAKE 1 TABLET BY MOUTH DAILY 90 tablet 1  . DULoxetine (CYMBALTA) 30 MG capsule TAKE ONE CAPSULE EVERY DAY IN ADDITION TO 60MG  CAPSULE FOR A TOTAL OF 90MG  DAILY 90 capsule 1  . DULoxetine (CYMBALTA) 60 MG capsule TAKE ONE CAPSULE BY MOUTH EVERY DAY TAKE ALONG WITH 30MG  CAPSULE. TOTAL DAILY DOSE is 90MG . 90 capsule 1  . famotidine (PEPCID) 20 MG tablet TAKE ONE TABLET EVERY DAY AFTER SUPPER 90 tablet 2  . fluticasone furoate-vilanterol (BREO ELLIPTA) 100-25 MCG/ACT AEPB INHALE 1 PUFF INTO THE LUNGS DAILY 60 each 11  . furosemide (LASIX) 20 MG tablet Take 1 tablet (20 mg total) by mouth daily. 90 tablet 3  . gabapentin (NEURONTIN) 600 MG tablet TAKE ONE TABLET BY MOUTH EVERY DAY AT BEDTIME 90 tablet 1  . HYDROcodone-acetaminophen (NORCO) 10-325 MG tablet Take 1 tablet by mouth 3 (three) times daily as needed. 90 tablet 0  . ipratropium-albuterol (DUONEB) 0.5-2.5 (3) MG/3ML SOLN Take 3 mLs by nebulization every 4 (four) hours as needed (wheezing or SOB). During exacerbation 360 mL 1  . irbesartan (AVAPRO) 75 MG tablet TAKE 1 TABLET(75 MG) BY MOUTH DAILY 90 tablet 1  . levothyroxine (SYNTHROID) 100 MCG tablet TAKE 1 TABLET(100 MCG) BY  MOUTH DAILY 90 tablet 3   No current facility-administered medications on file prior to visit.    Review of Systems     Objective:  There were no vitals filed for this visit. There were no vitals filed for this visit. There is no height or weight on file to calculate BMI.  BP Readings from Last 3 Encounters:  02/07/23 (!) 148/98  01/03/23 124/82  12/16/22 136/80    Wt Readings from Last 3 Encounters:  02/07/23 139 lb (63 kg)  01/03/23 136 lb 6.4 oz (61.9 kg)  12/16/22 136 lb 6.4 oz (61.9 kg)       Physical Exam Constitutional: She appears well-developed and well-nourished. No distress.  HENT:  Head: Normocephalic and atraumatic.  Right Ear: External ear normal. Normal ear canal and TM Left Ear: External ear normal.  Normal ear canal and TM Mouth/Throat: Oropharynx is clear and moist.  Eyes: Conjunctivae normal.  Neck: Neck supple. No tracheal deviation present. No thyromegaly present.  No carotid bruit  Cardiovascular: Normal rate, regular rhythm and normal heart sounds.   No murmur heard.  No edema. Pulmonary/Chest: Effort normal and breath sounds normal. No respiratory distress. She has no wheezes. She has no rales.  Breast: deferred   Abdominal: Soft. She exhibits no distension. There is no tenderness.  Lymphadenopathy: She has no cervical adenopathy.  Skin: Skin is warm and dry. She is not diaphoretic.  Psychiatric: She has a normal mood and affect. Her behavior is normal.     Lab Results  Component Value Date   WBC 7.0 02/07/2023   HGB 13.3 02/07/2023   HCT 43.7 02/07/2023   PLT 203.0 02/07/2023   GLUCOSE 74 02/07/2023   CHOL 127 02/07/2023   TRIG 67.0 02/07/2023   HDL 58.10 02/07/2023   LDLDIRECT 154.8 02/20/2009   LDLCALC 56 02/07/2023   ALT 30 02/07/2023   AST 34 02/07/2023   NA 141 02/07/2023   K 3.9 02/07/2023   CL 98 02/07/2023   CREATININE 0.75 02/07/2023   BUN 21 02/07/2023   CO2 35 (H) 02/07/2023   TSH 0.59 02/07/2023   INR 1.2  09/18/2021   HGBA1C 6.2 02/07/2023   MICROALBUR 9.4 (H) 02/07/2023         Assessment & Plan:   Physical exam: Screening blood work  ordered Exercise   Weight   Substance abuse  none   Reviewed recommended immunizations.   Health Maintenance  Topic Date Due  . OPHTHALMOLOGY EXAM  Never done  . Medicare Annual Wellness (AWV)  01/12/2023  . COVID-19 Vaccine (4 - 2023-24 season) 02/23/2023 (Originally 12/25/2022)  . Zoster Vaccines- Shingrix (1 of 2) 05/10/2023 (Originally 08/04/1969)  . MAMMOGRAM  07/14/2023  . HEMOGLOBIN A1C  08/08/2023  . Diabetic kidney evaluation - eGFR measurement  02/07/2024  . Diabetic kidney evaluation - Urine ACR  02/07/2024  . FOOT EXAM  02/07/2024  . DTaP/Tdap/Td (2 - Td or Tdap) 01/08/2029  . Colonoscopy  05/05/2031  . Pneumonia Vaccine 91+ Years old  Completed  . INFLUENZA VACCINE  Completed  . Hepatitis C Screening  Completed  . HPV VACCINES  Aged Out  . DEXA SCAN  Discontinued          See Problem List for Assessment and Plan of chronic medical problems.     This encounter was created in error - please disregard.

## 2023-01-23 NOTE — Patient Instructions (Addendum)
Blood work was ordered.   The lab is on the first floor.    Medications changes include :       A referral was ordered and someone will call you to schedule an appointment.     Return in about 6 months (around 07/25/2023) for follow up.    Health Maintenance, Female Adopting a healthy lifestyle and getting preventive care are important in promoting health and wellness. Ask your health care provider about: The right schedule for you to have regular tests and exams. Things you can do on your own to prevent diseases and keep yourself healthy. What should I know about diet, weight, and exercise? Eat a healthy diet  Eat a diet that includes plenty of vegetables, fruits, low-fat dairy products, and lean protein. Do not eat a lot of foods that are high in solid fats, added sugars, or sodium. Maintain a healthy weight Body mass index (BMI) is used to identify weight problems. It estimates body fat based on height and weight. Your health care provider can help determine your BMI and help you achieve or maintain a healthy weight. Get regular exercise Get regular exercise. This is one of the most important things you can do for your health. Most adults should: Exercise for at least 150 minutes each week. The exercise should increase your heart rate and make you sweat (moderate-intensity exercise). Do strengthening exercises at least twice a week. This is in addition to the moderate-intensity exercise. Spend less time sitting. Even light physical activity can be beneficial. Watch cholesterol and blood lipids Have your blood tested for lipids and cholesterol at 73 years of age, then have this test every 5 years. Have your cholesterol levels checked more often if: Your lipid or cholesterol levels are high. You are older than 72 years of age. You are at high risk for heart disease. What should I know about cancer screening? Depending on your health history and family history, you may  need to have cancer screening at various ages. This may include screening for: Breast cancer. Cervical cancer. Colorectal cancer. Skin cancer. Lung cancer. What should I know about heart disease, diabetes, and high blood pressure? Blood pressure and heart disease High blood pressure causes heart disease and increases the risk of stroke. This is more likely to develop in people who have high blood pressure readings or are overweight. Have your blood pressure checked: Every 3-5 years if you are 98-25 years of age. Every year if you are 54 years old or older. Diabetes Have regular diabetes screenings. This checks your fasting blood sugar level. Have the screening done: Once every three years after age 67 if you are at a normal weight and have a low risk for diabetes. More often and at a younger age if you are overweight or have a high risk for diabetes. What should I know about preventing infection? Hepatitis B If you have a higher risk for hepatitis B, you should be screened for this virus. Talk with your health care provider to find out if you are at risk for hepatitis B infection. Hepatitis C Testing is recommended for: Everyone born from [birth year] through 1965. Anyone with known risk factors for hepatitis C. Sexually transmitted infections (STIs) Get screened for STIs, including gonorrhea and chlamydia, if: You are sexually active and are younger than 73 years of age. You are older than 73 years of age and your health care provider tells you that you are at risk for this  type of infection. Your sexual activity has changed since you were last screened, and you are at increased risk for chlamydia or gonorrhea. Ask your health care provider if you are at risk. Ask your health care provider about whether you are at high risk for HIV. Your health care provider may recommend a prescription medicine to help prevent HIV infection. If you choose to take medicine to prevent HIV, you should first get  tested for HIV. You should then be tested every 3 months for as long as you are taking the medicine. Pregnancy If you are about to stop having your period (premenopausal) and you may become pregnant, seek counseling before you get pregnant. Take 400 to 800 micrograms (mcg) of folic acid every day if you become pregnant. Ask for birth control (contraception) if you want to prevent pregnancy. Osteoporosis and menopause Osteoporosis is a disease in which the bones lose minerals and strength with aging. This can result in bone fractures. If you are 61 years old or older, or if you are at risk for osteoporosis and fractures, ask your health care provider if you should: Be screened for bone loss. Take a calcium or vitamin D supplement to lower your risk of fractures. Be given hormone replacement therapy (HRT) to treat symptoms of menopause. Follow these instructions at home: Alcohol use Do not drink alcohol if: Your health care provider tells you not to drink. You are pregnant, may be pregnant, or are planning to become pregnant. If you drink alcohol: Limit how much you have to: 0-1 drink a day. Know how much alcohol is in your drink. In the [place] one drink equals one 12 oz bottle of beer (355 mL), one 5 oz glass of wine (148 mL), or one 1 oz glass of hard liquor (44 mL). Lifestyle Do not use any products that contain nicotine or tobacco. These products include cigarettes, chewing tobacco, and vaping devices, such as e-cigarettes. If you need help quitting, ask your health care provider. Do not use street drugs. Do not share needles. Ask your health care provider for help if you need support or information about quitting drugs. General instructions Schedule regular health, dental, and eye exams. Stay current with your vaccines. Tell your health care provider if: You often feel depressed. You have ever been abused or do not feel safe at home. Summary Adopting a healthy lifestyle and getting  preventive care are important in promoting health and wellness. Follow your health care provider's instructions about healthy diet, exercising, and getting tested or screened for diseases. Follow your health care provider's instructions on monitoring your cholesterol and blood pressure. This information is not intended to replace advice given to you by your health care provider. Make sure you discuss any questions you have with your health care provider. Document Revised: 08/31/2020 Document Reviewed: 08/31/2020 Elsevier Patient Education  2024 [employer].

## 2023-01-24 ENCOUNTER — Encounter: Payer: Medicare Other | Admitting: Internal Medicine

## 2023-01-24 ENCOUNTER — Encounter: Payer: Self-pay | Admitting: Internal Medicine

## 2023-01-24 DIAGNOSIS — E119 Type 2 diabetes mellitus without complications: Secondary | ICD-10-CM

## 2023-01-24 DIAGNOSIS — E7849 Other hyperlipidemia: Secondary | ICD-10-CM

## 2023-01-24 DIAGNOSIS — K219 Gastro-esophageal reflux disease without esophagitis: Secondary | ICD-10-CM

## 2023-01-24 DIAGNOSIS — I25119 Atherosclerotic heart disease of native coronary artery with unspecified angina pectoris: Secondary | ICD-10-CM

## 2023-01-24 DIAGNOSIS — E039 Hypothyroidism, unspecified: Secondary | ICD-10-CM

## 2023-01-24 DIAGNOSIS — M810 Age-related osteoporosis without current pathological fracture: Secondary | ICD-10-CM

## 2023-01-24 DIAGNOSIS — I10 Essential (primary) hypertension: Secondary | ICD-10-CM

## 2023-01-24 DIAGNOSIS — F3289 Other specified depressive episodes: Secondary | ICD-10-CM

## 2023-01-24 DIAGNOSIS — Z0000 Encounter for general adult medical examination without abnormal findings: Secondary | ICD-10-CM

## 2023-01-24 DIAGNOSIS — N1832 Chronic kidney disease, stage 3b: Secondary | ICD-10-CM

## 2023-01-24 NOTE — Assessment & Plan Note (Signed)
Chronic Has been stable CBC, CMP

## 2023-01-24 NOTE — Assessment & Plan Note (Signed)
Chronic Has not decided on whether or not she wants to go on any medication Not able to exercise

## 2023-01-24 NOTE — Assessment & Plan Note (Addendum)
Chronic   Lab Results  Component Value Date   HGBA1C 6.0 07/25/2022   Sugars controlled Check A1c, urine albumin/creatinine ratio Continue lifestyle control

## 2023-01-24 NOTE — Assessment & Plan Note (Signed)
Chronic Following with cardiology Continue aspirin 81 mg daily, atorvastatin 40 mg daily

## 2023-01-24 NOTE — Assessment & Plan Note (Signed)
Chronic ?  Controlled or not Continue duloxetine 90 mg daily

## 2023-01-24 NOTE — Assessment & Plan Note (Signed)
Chronic Following with pain management Currently on hydrocodone-acetaminophen 10-325 mg 3 times daily as needed

## 2023-01-24 NOTE — Assessment & Plan Note (Signed)
Chronic Blood pressure overall controlled-slightly variable CMP, CBC Continue bisoprolol 5 mg daily, torsemide 10 mg daily, irbesartan 75 mg daily

## 2023-01-24 NOTE — Assessment & Plan Note (Signed)
Chronic Check lipid panel  Continue atorvastatin 20 mg daily   

## 2023-01-24 NOTE — Assessment & Plan Note (Signed)
Chronic  Experiencing increased fatigue-concern for hypothyroid Check tsh and will titrate med dose if needed Currently taking levothyroxine 100 mcg daily

## 2023-01-24 NOTE — Assessment & Plan Note (Signed)
Chronic GERD controlled Continue famotidine 20 mg daily 

## 2023-01-25 ENCOUNTER — Other Ambulatory Visit: Payer: Self-pay | Admitting: Internal Medicine

## 2023-02-03 DIAGNOSIS — L03114 Cellulitis of left upper limb: Secondary | ICD-10-CM | POA: Diagnosis not present

## 2023-02-06 ENCOUNTER — Encounter: Payer: Self-pay | Admitting: Internal Medicine

## 2023-02-06 NOTE — Progress Notes (Unsigned)
Subjective:    Patient ID: Hannah Scott, female    DOB: 1950/06/06, 72 y.o.   MRN: [id number]      HPI Hannah Scott is here for a Physical exam and her chronic medical problems.   ?  Want to take OP medication, dexa   Medications and allergies reviewed with patient and updated if appropriate.  Current Outpatient Medications on File Prior to Visit  Medication Sig Dispense Refill   albuterol (VENTOLIN HFA) 108 (90 Base) MCG/ACT inhaler Inhale 2 puffs into the lungs every 4 (four) hours as needed for wheezing or shortness of breath. 1 each 11   aspirin EC 81 MG tablet Take 1 tablet (81 mg total) by mouth daily. Swallow whole. 90 tablet 3   atorvastatin (LIPITOR) 40 MG tablet Take 1 tablet (40 mg total) by mouth daily. 90 tablet 0   bisoprolol (ZEBETA) 5 MG tablet TAKE 1 TABLET BY MOUTH DAILY 90 tablet 1   clobetasol cream (TEMOVATE) 0.05 % APPLY TO THE AFFECTED AREA TWICE DAILY 30 g 2   DULoxetine (CYMBALTA) 30 MG capsule TAKE ONE CAPSULE EVERY DAY IN ADDITION TO 60MG  CAPSULE FOR A TOTAL OF 90MG  DAILY 90 capsule 1   DULoxetine (CYMBALTA) 60 MG capsule TAKE ONE CAPSULE BY MOUTH EVERY DAY TAKE ALONG WITH 30MG  CAPSULE. TOTAL DAILY DOSE is 90MG . 90 capsule 1   famotidine (PEPCID) 20 MG tablet TAKE ONE TABLET EVERY DAY AFTER SUPPER 90 tablet 2   fluticasone furoate-vilanterol (BREO ELLIPTA) 100-25 MCG/ACT AEPB INHALE 1 PUFF INTO THE LUNGS DAILY 60 each 11   furosemide (LASIX) 20 MG tablet Take 1 tablet (20 mg total) by mouth daily. 90 tablet 3   gabapentin (NEURONTIN) 600 MG tablet TAKE ONE TABLET BY MOUTH EVERY DAY AT BEDTIME 90 tablet 1   HYDROcodone-acetaminophen (NORCO) 10-325 MG tablet Take 1 tablet by mouth 3 (three) times daily as needed. 90 tablet 0   ipratropium-albuterol (DUONEB) 0.5-2.5 (3) MG/3ML SOLN Take 3 mLs by nebulization every 4 (four) hours as needed (wheezing or SOB). During exacerbation 360 mL 1   irbesartan (AVAPRO) 75 MG tablet TAKE 1 TABLET(75 MG) BY MOUTH DAILY 90 tablet  1   levothyroxine (SYNTHROID) 100 MCG tablet TAKE 1 TABLET(100 MCG) BY MOUTH DAILY 90 tablet 3   No current facility-administered medications on file prior to visit.    Review of Systems     Objective:  There were no vitals filed for this visit. There were no vitals filed for this visit. There is no height or weight on file to calculate BMI.  BP Readings from Last 3 Encounters:  01/03/23 124/82  12/16/22 136/80  12/16/22 134/84    Wt Readings from Last 3 Encounters:  01/03/23 136 lb 6.4 oz (61.9 kg)  12/16/22 136 lb 6.4 oz (61.9 kg)  12/16/22 141 lb 6.4 oz (64.1 kg)       Physical Exam Constitutional: She appears well-developed and well-nourished. No distress.  HENT:  Head: Normocephalic and atraumatic.  Right Ear: External ear normal. Normal ear canal and TM Left Ear: External ear normal.  Normal ear canal and TM Mouth/Throat: Oropharynx is clear and moist.  Eyes: Conjunctivae normal.  Neck: Neck supple. No tracheal deviation present. No thyromegaly present.  No carotid bruit  Cardiovascular: Normal rate, regular rhythm and normal heart sounds.   No murmur heard.  No edema. Pulmonary/Chest: Effort normal and breath sounds normal. No respiratory distress. She has no wheezes. She has no rales.  Breast: deferred  Abdominal: Soft. She exhibits no distension. There is no tenderness.  Lymphadenopathy: She has no cervical adenopathy.  Skin: Skin is warm and dry. She is not diaphoretic.  Psychiatric: She has a normal mood and affect. Her behavior is normal.     Lab Results  Component Value Date   WBC 4.5 07/25/2022   HGB 13.9 07/25/2022   HCT 43.0 07/25/2022   PLT 187.0 07/25/2022   GLUCOSE 74 12/16/2022   CHOL 123 07/25/2022   TRIG 56.0 07/25/2022   HDL 58.60 07/25/2022   LDLDIRECT 154.8 02/20/2009   LDLCALC 54 07/25/2022   ALT 19 07/25/2022   AST 30 07/25/2022   NA 143 12/16/2022   K 4.2 12/16/2022   CL 99 12/16/2022   CREATININE 0.96 12/16/2022   BUN 17  12/16/2022   CO2 35 (H) 12/16/2022   TSH 0.11 (L) 12/16/2022   INR 1.2 09/18/2021   HGBA1C 6.0 07/25/2022   MICROALBUR 1.6 10/29/2021         Assessment & Plan:   Physical exam: Screening blood work  ordered Exercise   Weight   Substance abuse  none   Reviewed recommended immunizations.   Health Maintenance  Topic Date Due   FOOT EXAM  Never done   OPHTHALMOLOGY EXAM  Never done   Zoster Vaccines- Shingrix (1 of 2) Never done   DEXA SCAN  01/15/2022   Diabetic kidney evaluation - Urine ACR  10/30/2022   COVID-19 Vaccine (4 - 2023-24 season) 12/25/2022   Medicare Annual Wellness (AWV)  01/12/2023   HEMOGLOBIN A1C  01/24/2023   INFLUENZA VACCINE  07/24/2023 (Originally 11/24/2022)   MAMMOGRAM  07/14/2023   Diabetic kidney evaluation - eGFR measurement  12/16/2023   DTaP/Tdap/Td (2 - Td or Tdap) 01/08/2029   Colonoscopy  05/05/2031   Pneumonia Vaccine 40+ Years old  Completed   Hepatitis C Screening  Completed   HPV VACCINES  Aged Out          See Problem List for Assessment and Plan of chronic medical problems.

## 2023-02-06 NOTE — Patient Instructions (Addendum)
Flu immunization administered today.     Blood work was ordered.   The lab is on the first floor.    Medications changes include :   sonata 5 mg at night for sleep, jardiance 10 mg daily     Return in about 6 months (around 08/08/2023) for follow up.   Cardiology - Dr Excell Seltzer, Dr Antoine Poche, Dr Cristal Deer    Health Maintenance, Female Adopting a healthy lifestyle and getting preventive care are important in promoting health and wellness. Ask your health care provider about: The right schedule for you to have regular tests and exams. Things you can do on your own to prevent diseases and keep yourself healthy. What should I know about diet, weight, and exercise? Eat a healthy diet  Eat a diet that includes plenty of vegetables, fruits, low-fat dairy products, and lean protein. Do not eat a lot of foods that are high in solid fats, added sugars, or sodium. Maintain a healthy weight Body mass index (BMI) is used to identify weight problems. It estimates body fat based on height and weight. Your health care provider can help determine your BMI and help you achieve or maintain a healthy weight. Get regular exercise Get regular exercise. This is one of the most important things you can do for your health. Most adults should: Exercise for at least 150 minutes each week. The exercise should increase your heart rate and make you sweat (moderate-intensity exercise). Do strengthening exercises at least twice a week. This is in addition to the moderate-intensity exercise. Spend less time sitting. Even light physical activity can be beneficial. Watch cholesterol and blood lipids Have your blood tested for lipids and cholesterol at 73 years of age, then have this test every 5 years. Have your cholesterol levels checked more often if: Your lipid or cholesterol levels are high. You are older than 73 years of age. You are at high risk for heart disease. What should I know about cancer  screening? Depending on your health history and family history, you may need to have cancer screening at various ages. This may include screening for: Breast cancer. Cervical cancer. Colorectal cancer. Skin cancer. Lung cancer. What should I know about heart disease, diabetes, and high blood pressure? Blood pressure and heart disease High blood pressure causes heart disease and increases the risk of stroke. This is more likely to develop in people who have high blood pressure readings or are overweight. Have your blood pressure checked: Every 3-5 years if you are 64-74 years of age. Every year if you are 56 years old or older. Diabetes Have regular diabetes screenings. This checks your fasting blood sugar level. Have the screening done: Once every three years after age 16 if you are at a normal weight and have a low risk for diabetes. More often and at a younger age if you are overweight or have a high risk for diabetes. What should I know about preventing infection? Hepatitis B If you have a higher risk for hepatitis B, you should be screened for this virus. Talk with your health care provider to find out if you are at risk for hepatitis B infection. Hepatitis C Testing is recommended for: Everyone born from [birth year] through 1966. Anyone with known risk factors for hepatitis C. Sexually transmitted infections (STIs) Get screened for STIs, including gonorrhea and chlamydia, if: You are sexually active and are younger than 73 years of age. You are older than 73 years of age and your health care provider  tells you that you are at risk for this type of infection. Your sexual activity has changed since you were last screened, and you are at increased risk for chlamydia or gonorrhea. Ask your health care provider if you are at risk. Ask your health care provider about whether you are at high risk for HIV. Your health care provider may recommend a prescription medicine to help prevent HIV  infection. If you choose to take medicine to prevent HIV, you should first get tested for HIV. You should then be tested every 3 months for as long as you are taking the medicine. Pregnancy If you are about to stop having your period (premenopausal) and you may become pregnant, seek counseling before you get pregnant. Take 400 to 800 micrograms (mcg) of folic acid every day if you become pregnant. Ask for birth control (contraception) if you want to prevent pregnancy. Osteoporosis and menopause Osteoporosis is a disease in which the bones lose minerals and strength with aging. This can result in bone fractures. If you are 20 years old or older, or if you are at risk for osteoporosis and fractures, ask your health care provider if you should: Be screened for bone loss. Take a calcium or vitamin D supplement to lower your risk of fractures. Be given hormone replacement therapy (HRT) to treat symptoms of menopause. Follow these instructions at home: Alcohol use Do not drink alcohol if: Your health care provider tells you not to drink. You are pregnant, may be pregnant, or are planning to become pregnant. If you drink alcohol: Limit how much you have to: 0-1 drink a day. Know how much alcohol is in your drink. In the [place] one drink equals one 12 oz bottle of beer (355 mL), one 5 oz glass of wine (148 mL), or one 1 oz glass of hard liquor (44 mL). Lifestyle Do not use any products that contain nicotine or tobacco. These products include cigarettes, chewing tobacco, and vaping devices, such as e-cigarettes. If you need help quitting, ask your health care provider. Do not use street drugs. Do not share needles. Ask your health care provider for help if you need support or information about quitting drugs. General instructions Schedule regular health, dental, and eye exams. Stay current with your vaccines. Tell your health care provider if: You often feel depressed. You have ever been abused  or do not feel safe at home. Summary Adopting a healthy lifestyle and getting preventive care are important in promoting health and wellness. Follow your health care provider's instructions about healthy diet, exercising, and getting tested or screened for diseases. Follow your health care provider's instructions on monitoring your cholesterol and blood pressure. This information is not intended to replace advice given to you by your health care provider. Make sure you discuss any questions you have with your health care provider. Document Revised: 08/31/2020 Document Reviewed: 08/31/2020 Elsevier Patient Education  2024 [employer].

## 2023-02-07 ENCOUNTER — Ambulatory Visit: Payer: Medicare Other | Admitting: Internal Medicine

## 2023-02-07 VITALS — BP 148/98 | HR 99 | Temp 98.2°F | Ht 59.0 in | Wt 139.0 lb

## 2023-02-07 DIAGNOSIS — Z23 Encounter for immunization: Secondary | ICD-10-CM | POA: Diagnosis not present

## 2023-02-07 DIAGNOSIS — I25119 Atherosclerotic heart disease of native coronary artery with unspecified angina pectoris: Secondary | ICD-10-CM

## 2023-02-07 DIAGNOSIS — E559 Vitamin D deficiency, unspecified: Secondary | ICD-10-CM

## 2023-02-07 DIAGNOSIS — I10 Essential (primary) hypertension: Secondary | ICD-10-CM | POA: Diagnosis not present

## 2023-02-07 DIAGNOSIS — E7849 Other hyperlipidemia: Secondary | ICD-10-CM | POA: Diagnosis not present

## 2023-02-07 DIAGNOSIS — G4709 Other insomnia: Secondary | ICD-10-CM

## 2023-02-07 DIAGNOSIS — I5032 Chronic diastolic (congestive) heart failure: Secondary | ICD-10-CM | POA: Diagnosis not present

## 2023-02-07 DIAGNOSIS — E119 Type 2 diabetes mellitus without complications: Secondary | ICD-10-CM

## 2023-02-07 DIAGNOSIS — Z0000 Encounter for general adult medical examination without abnormal findings: Secondary | ICD-10-CM

## 2023-02-07 DIAGNOSIS — J4540 Moderate persistent asthma, uncomplicated: Secondary | ICD-10-CM

## 2023-02-07 DIAGNOSIS — E039 Hypothyroidism, unspecified: Secondary | ICD-10-CM | POA: Diagnosis not present

## 2023-02-07 DIAGNOSIS — M810 Age-related osteoporosis without current pathological fracture: Secondary | ICD-10-CM

## 2023-02-07 DIAGNOSIS — N1832 Chronic kidney disease, stage 3b: Secondary | ICD-10-CM

## 2023-02-07 LAB — COMPREHENSIVE METABOLIC PANEL
ALT: 30 U/L (ref 0–35)
AST: 34 U/L (ref 0–37)
Albumin: 4.1 g/dL (ref 3.5–5.2)
Alkaline Phosphatase: 93 U/L (ref 39–117)
BUN: 21 mg/dL (ref 6–23)
CO2: 35 meq/L — ABNORMAL HIGH (ref 19–32)
Calcium: 9.9 mg/dL (ref 8.4–10.5)
Chloride: 98 meq/L (ref 96–112)
Creatinine, Ser: 0.75 mg/dL (ref 0.40–1.20)
GFR: 78.93 mL/min (ref >60.00)
Glucose, Bld: 74 mg/dL (ref 70–99)
Potassium: 3.9 meq/L (ref 3.5–5.1)
Sodium: 141 meq/L (ref 135–145)
Total Bilirubin: 1 mg/dL (ref 0.2–1.2)
Total Protein: 6.4 g/dL (ref 6.0–8.3)

## 2023-02-07 LAB — CBC WITH DIFFERENTIAL/PLATELET
Basophils Absolute: 0 10*3/uL (ref 0.0–0.1)
Basophils Relative: 0.1 % (ref 0.0–3.0)
Eosinophils Absolute: 0 10*3/uL (ref 0.0–0.7)
Eosinophils Relative: 0 % (ref 0.0–5.0)
HCT: 43.7 % (ref 36.0–46.0)
Hemoglobin: 13.3 g/dL (ref 12.0–15.0)
Lymphocytes Relative: 7.1 % — ABNORMAL LOW (ref 12.0–46.0)
Lymphs Abs: 0.5 10*3/uL — ABNORMAL LOW (ref 0.7–4.0)
MCHC: 30.5 g/dL (ref 30.0–36.0)
MCV: 88.3 fL (ref 78.0–100.0)
Monocytes Absolute: 0.9 10*3/uL (ref 0.1–1.0)
Monocytes Relative: 12.9 % — ABNORMAL HIGH (ref 3.0–12.0)
Neutro Abs: 5.6 10*3/uL (ref 1.4–7.7)
Neutrophils Relative %: 79.9 % — ABNORMAL HIGH (ref 43.0–77.0)
Platelets: 203 10*3/uL (ref 150.0–400.0)
RBC: 4.95 Mil/uL (ref 3.87–5.11)
RDW: 16.2 % — ABNORMAL HIGH (ref 11.5–15.5)
WBC: 7 10*3/uL (ref 4.0–10.5)

## 2023-02-07 LAB — LIPID PANEL
Cholesterol: 127 mg/dL (ref 0–200)
HDL: 58.1 mg/dL (ref >39.00)
LDL Cholesterol: 56 mg/dL (ref 0–99)
NonHDL: 69.35
Total CHOL/HDL Ratio: 2
Triglycerides: 67 mg/dL (ref 0.0–149.0)
VLDL: 13.4 mg/dL (ref 0.0–40.0)

## 2023-02-07 LAB — MICROALBUMIN / CREATININE URINE RATIO
Creatinine,U: 25.5 mg/dL
Microalb Creat Ratio: 36.9 mg/g — ABNORMAL HIGH (ref 0.0–30.0)
Microalb, Ur: 9.4 mg/dL — ABNORMAL HIGH (ref 0.0–1.9)

## 2023-02-07 LAB — HEMOGLOBIN A1C: Hgb A1c MFr Bld: 6.2 % (ref 4.6–6.5)

## 2023-02-07 LAB — TSH: TSH: 0.59 u[IU]/mL (ref 0.35–5.50)

## 2023-02-07 MED ORDER — EMPAGLIFLOZIN 10 MG PO TABS: 10.0000 mg | ORAL_TABLET | Freq: Every day | ORAL | 5 refills | Status: DC

## 2023-02-07 MED ORDER — ZALEPLON 5 MG PO CAPS: 5.0000 mg | ORAL_CAPSULE | Freq: Every evening | ORAL | 0 refills | Status: DC | PRN

## 2023-02-07 NOTE — Assessment & Plan Note (Signed)
Chronic Does not want to have another DEXA scan or medication at this time Discussed risk of fracture even without fall Not able to exercise Continue calcium and vitamin D

## 2023-02-07 NOTE — Assessment & Plan Note (Signed)
Chronic  Experiencing increased fatigue-concern for hypothyroid Check tsh and will titrate med dose if needed Currently taking levothyroxine 100 mcg daily

## 2023-02-07 NOTE — Assessment & Plan Note (Signed)
Chronic Has been stable CBC, CMP

## 2023-02-07 NOTE — Assessment & Plan Note (Signed)
Chronic   Lab Results  Component Value Date   HGBA1C 6.0 07/25/2022   Sugars controlled Check A1c Has been controlling sugars with lifestyle, but benefits of starting Jardiance for cardiac benefit and renal protection discussed-start Jardiance 10 mg daily

## 2023-02-07 NOTE — Assessment & Plan Note (Signed)
Chronic Blood pressure overall controlled-but variable at home She states she does feel when her blood pressure is too high or too low-I think it would be difficult to get her blood pressure with tighter control Continue with irbesartan 75 mg daily and bisoprolol 5 mg daily-could consider changing bisoprolol to a different beta-blocker to see if that gave better overall control CMP, CBC

## 2023-02-07 NOTE — Assessment & Plan Note (Signed)
Chronic Following with cardiology-needs to establish with a new cardiologist since her cardiologist left-will call up to make an appointment-does have an echocardiogram scheduled Continue aspirin 81 mg daily, atorvastatin 40 mg daily

## 2023-02-07 NOTE — Addendum Note (Signed)
Addended by: Karma Ganja on: 02/07/2023 04:37 PM   Modules accepted: Orders

## 2023-02-07 NOTE — Assessment & Plan Note (Signed)
Chronic Taking vitamin D supplementation Check vitamin d level

## 2023-02-07 NOTE — Assessment & Plan Note (Signed)
Chronic Sleep is poor - only getting about 4 hrs at night and it is broken up sleep Has not tolerated trazodone or Remeron Discussed that we can try something but it is trial and error to see if it causes any side effects Start Sonata 5 mg nightly

## 2023-02-07 NOTE — Assessment & Plan Note (Signed)
Chronic Following with cardiology-needs to establish with a new cardiologist Echocardiogram ordered for December Currently taking Lasix 20 mg daily-extra dose as needed for weight gain Start Jardiance 10 mg daily CMP

## 2023-02-07 NOTE — Assessment & Plan Note (Signed)
Chronic Check lipid panel  Continue atorvastatin 20 mg daily

## 2023-02-08 ENCOUNTER — Other Ambulatory Visit (HOSPITAL_COMMUNITY): Payer: Self-pay

## 2023-02-13 ENCOUNTER — Encounter: Payer: Medicare Other | Attending: Registered Nurse | Admitting: Registered Nurse

## 2023-02-13 ENCOUNTER — Encounter: Payer: Self-pay | Admitting: Registered Nurse

## 2023-02-13 VITALS — BP 141/82 | HR 88 | Ht 60.0 in | Wt 134.0 lb

## 2023-02-13 DIAGNOSIS — M48062 Spinal stenosis, lumbar region with neurogenic claudication: Secondary | ICD-10-CM | POA: Diagnosis not present

## 2023-02-13 DIAGNOSIS — Z5181 Encounter for therapeutic drug level monitoring: Secondary | ICD-10-CM | POA: Diagnosis not present

## 2023-02-13 DIAGNOSIS — G894 Chronic pain syndrome: Secondary | ICD-10-CM | POA: Diagnosis not present

## 2023-02-13 DIAGNOSIS — Z79891 Long term (current) use of opiate analgesic: Secondary | ICD-10-CM | POA: Insufficient documentation

## 2023-02-13 MED ORDER — HYDROCODONE-ACETAMINOPHEN 10-325 MG PO TABS: 1.0000 | ORAL_TABLET | Freq: Three times a day (TID) | ORAL | 0 refills | Status: DC | PRN

## 2023-02-13 NOTE — Progress Notes (Signed)
Subjective:    Patient ID: Hannah Scott, female    DOB: 05/25/1950, 72 y.o.   MRN: [id number]  HPI: Hannah Scott is a 72 y.o. female who is scheduled for Telephone visit, I connected with Hannah Scott by telephone and verified that I am speaking with the correct person using two identifiers.  Location: Patient: In her Home Provider: In the office    I discussed the limitations, risks, security and privacy concerns of performing an evaluation and management service by telephone and the availability of in person appointments. I also discussed with the patient that there may be a patient responsible charge related to this service. The patient expressed understanding and agreed to proceed.  She  states her  pain is located in her lower back. She rates her pain 6. Her current exercise regime is walking with her walker or cane in her home.  Hannah Scott Morphine equivalent is 30.00 MME.   Last Oral Swab was Performed on 01/03/2023, it was consistent.    Pain Inventory Average Pain 7 Pain Right Now 6 My pain is constant, burning, tingling, and aching  In the last 24 hours, has pain interfered with the following? General activity 10 Relation with others 10 Enjoyment of life 10 What TIME of day is your pain at its worst? morning , daytime, evening, night, and varies Sleep (in general) Poor  Pain is worse with: walking, bending, and standing Pain improves with: rest and medication Relief from Meds: 5  Family History  Problem Relation Age of Onset   Heart failure Mother    Pneumonia Mother    Hypertension Mother    Heart disease Mother    Stroke Maternal Grandfather    Hypertension Maternal Grandfather    Heart attack Father    Heart disease Father    Asthma Paternal Uncle        PAT UNCLES   Heart attack Paternal Uncle    Social History   Socioeconomic History   Marital status: Widowed    Spouse name: Not on file   Number of children: Not on file   Years of education: Not on  file   Highest education level: Not on file  Occupational History   Not on file  Tobacco Use   Smoking status: Never    Passive exposure: Never   Smokeless tobacco: Never  Vaping Use   Vaping status: Never Used  Substance and Sexual Activity   Alcohol use: No    Alcohol/week: 0.0 standard drinks of alcohol   Drug use: No   Sexual activity: Not Currently    Partners: Male    Birth control/protection: Surgical  Other Topics Concern   Not on file  Social History Narrative   Widowed in 2015   2 sons 1 lives in the area the other is in close contact    1 grandchild   Never smoker no drug use no alcohol   Social Determinants of Health   Financial Resource Strain: Low Risk  (01/11/2022)   Overall Financial Resource Strain (CARDIA)    Difficulty of Paying Living Expenses: Not hard at all  Food Insecurity: No Food Insecurity (01/11/2022)   Hunger Vital Sign    Worried About Running Out of Food in the Last Year: Never true    Ran Out of Food in the Last Year: Never true  Transportation Needs: No Transportation Needs (01/11/2022)   PRAPARE - Administrator, Civil Service (Medical): No  Lack of Transportation (Non-Medical): No  Physical Activity: Inactive (01/11/2022)   Exercise Vital Sign    Days of Exercise per Week: 0 days    Minutes of Exercise per Session: 0 min  Stress: No Stress Concern Present (01/11/2022)   [employer] of Occupational Health - Occupational Stress Questionnaire    Feeling of Stress : Not at all  Social Connections: Socially Integrated (01/11/2022)   Social Connection and Isolation Panel [NHANES]    Frequency of Communication with Friends and Family: More than three times a week    Frequency of Social Gatherings with Friends and Family: More than three times a week    Attends Religious Services: More than 4 times per year    Active Member of Clubs or Organizations: Yes    Attends Engineer, structural: More than 4 times per year     Marital Status: Married   Past Surgical History:  Procedure Laterality Date   ANKLE SURGERY Left    ligament   APPENDECTOMY  1987   AT [facility]   BACK SURGERY     Fusion   BREAST LUMPECTOMY  2003   radiation on right   BREAST LUMPECTOMY WITH RADIOACTIVE SEED LOCALIZATION Left 02/12/2016   Procedure: LEFT BREAST LUMPECTOMY WITH RADIOACTIVE SEED LOCALIZATION;  Surgeon: Glenna Fellows, MD;  Location: [facility];  Service: General;  Laterality: Left;   CARDIOVASCULAR STRESS TEST  09/07/05   Nuclear, was negative   CATARACT EXTRACTION Bilateral 01,03   COLONOSCOPY WITH PROPOFOL N/A 05/04/2021   Procedure: COLONOSCOPY WITH PROPOFOL;  Surgeon: Iva Boop, MD;  Location: [facility];  Service: Endoscopy;  Laterality: N/A;   ESOPHAGOGASTRODUODENOSCOPY (EGD) WITH PROPOFOL N/A 05/04/2021   Procedure: ESOPHAGOGASTRODUODENOSCOPY (EGD) WITH PROPOFOL;  Surgeon: Iva Boop, MD;  Location: [facility];  Service: Endoscopy;  Laterality: N/A;   OOPHORECTOMY     LSO -RSO   PELVIC LAPAROSCOPY  1989   RSO, LYSIS OF ADHESIONS   POLYPECTOMY  05/04/2021   Procedure: POLYPECTOMY;  Surgeon: Iva Boop, MD;  Location: [facility];  Service: Endoscopy;;   TOTAL ABDOMINAL HYSTERECTOMY  1987   LSO, APPENDECTOMY   TOTAL HIP ARTHROPLASTY Right 10/28/2013   dr Ophelia Charter   TOTAL HIP ARTHROPLASTY Right 10/28/2013   Procedure: TOTAL HIP ARTHROPLASTY ANTERIOR APPROACH;  Surgeon: Eldred Manges, MD;  Location: [facility];  Service: Orthopedics;  Laterality: Right;  Right Total Hip Arthroplasty, Direct Anterior Approach   Past Surgical History:  Procedure Laterality Date   ANKLE SURGERY Left    ligament   APPENDECTOMY  1987   AT [facility]   BACK SURGERY     Fusion   BREAST LUMPECTOMY  2003   radiation on right   BREAST LUMPECTOMY WITH RADIOACTIVE SEED LOCALIZATION Left 02/12/2016   Procedure: LEFT BREAST LUMPECTOMY WITH RADIOACTIVE SEED LOCALIZATION;  Surgeon: Glenna Fellows, MD;  Location: [facility];  Service: General;  Laterality: Left;   CARDIOVASCULAR STRESS TEST  09/07/05   Nuclear, was negative   CATARACT EXTRACTION Bilateral 01,03   COLONOSCOPY WITH PROPOFOL N/A 05/04/2021   Procedure: COLONOSCOPY WITH PROPOFOL;  Surgeon: Iva Boop, MD;  Location: [facility];  Service: Endoscopy;  Laterality: N/A;   ESOPHAGOGASTRODUODENOSCOPY (EGD) WITH PROPOFOL N/A 05/04/2021   Procedure: ESOPHAGOGASTRODUODENOSCOPY (EGD) WITH PROPOFOL;  Surgeon: Iva Boop, MD;  Location: [facility];  Service: Endoscopy;  Laterality: N/A;   OOPHORECTOMY     LSO -RSO   PELVIC LAPAROSCOPY  1989   RS[facility],  LYSIS OF ADHESIONS   POLYPECTOMY  05/04/2021   Procedure: POLYPECTOMY;  Surgeon: Iva Boop, MD;  Location: [facility];  Service: Endoscopy;;   TOTAL ABDOMINAL HYSTERECTOMY  1987   LSO, APPENDECTOMY   TOTAL HIP ARTHROPLASTY Right 10/28/2013   dr Ophelia Charter   TOTAL HIP ARTHROPLASTY Right 10/28/2013   Procedure: TOTAL HIP ARTHROPLASTY ANTERIOR APPROACH;  Surgeon: Eldred Manges, MD;  Location: [facility];  Service: Orthopedics;  Laterality: Right;  Right Total Hip Arthroplasty, Direct Anterior Approach   Past Medical History:  Diagnosis Date   Anemia    hx   Arthritis    Asthma    PFTs, February, 2011, moderate obstructive disease with response to bronchodilators, normal lung volumes, moderate reduction in diffusing capacity   Atrial septal aneurysm    Echo, 2008-not noted on 13 echo   CAD (coronary artery disease)    90% distal LAD in the past  /   nuclear, 2008, no ischemia, ejection fraction 70%   Cancer (HCC) 2002   DUCTAL CIS--S/P LUMPECTOMY, RADIATION AND 6 WEEKS OF TAMOXIFEN   D-dimer, elevated    January, 2014   Depression    Ejection fraction    EF 60%, echo, October, 2008   Elevated CPK    January, 2014   Endometriosis 1989   RIGHT TUBE   Endometriosis 1987   LEFT TUBE/OVARY W FOCAL IN-SITU ENDOMETRIAL ADENOCARCINOMA   GERD (gastroesophageal reflux disease)    occ   H/O  hiatal hernia    ?   Hyperlipidemia    Hypertension    Hypothyroidism    Patient has had in the past that she does not need treatment   Kyphoscoliosis    Obstructive airway disease (HCC)    Pinched nerve    lower back   Shortness of breath    Echo 2/22: EF 60-65, no RWMA, mild LVH, GR 1  DD, GLS -24%, normal RVSF, mild MR, trivial AI, borderline dilation of aortic root (39 mm)   UTI (lower urinary tract infection)    There were no vitals taken for this visit.  Opioid Risk Score:   Fall Risk Score:  `1  Depression screen [facility] 2/9     01/03/2023    2:31 PM 11/22/2022    3:06 PM 10/21/2022    2:10 PM 09/13/2022    2:51 PM 08/15/2022   11:52 AM 07/25/2022    8:44 AM 07/25/2022    8:43 AM  Depression screen PHQ 2/9  Decreased Interest 1 0 1 1 1  0 0  Down, Depressed, Hopeless 1 0 1 1 1  0 0  PHQ - 2 Score 2 0 2 2 2  0 0  Altered sleeping      2   Tired, decreased energy      3   Change in appetite      0   Feeling bad or failure about yourself       0   Trouble concentrating      0   Moving slowly or fidgety/restless      0   Suicidal thoughts      0   PHQ-9 Score      5   Difficult doing work/chores      Somewhat difficult       Review of Systems  Constitutional: Negative.   HENT: Negative.    Eyes: Negative.   Respiratory: Negative.    Cardiovascular: Negative.   Gastrointestinal: Negative.   Endocrine: Negative.   Musculoskeletal:  Positive for arthralgias and back pain.  Skin: Negative.   Allergic/Immunologic: Negative.   Neurological: Negative.   Hematological: Negative.   Psychiatric/Behavioral: Negative.         Objective:   Physical Exam Vitals and nursing note reviewed.  Musculoskeletal:     Comments: No Physical Exam Performed: Telephone Visit           Assessment & Plan:  1. Lumbar post laminectomy syndrome with severe kyphoscoliosis thoracolumbar spine, s/p lumbar fusion/  02/13/2023 Continue current medication regimen. Continue  Hydrocodone  10/325mg  one tablet every 8 hours #90. We will continue the opioid monitoring program, this consists of regular clinic visits, examinations, urine drug screen, pill counts as well as use of [place] Controlled Substance Reporting system. A 12 month History has been reviewed on the [place] Controlled Substance Reporting System on 02/13/2023 2. Right Hip OA: S/P Right Hip Replacement 10/28/2013. 02/13/2023 3.Depression: Continue Cymbalta. Has Family Support and Friends.  Counseling with Renato Gails. 02/13/2023 4. Bilateral Hip Pain/ Right Greater Trochanteric Tenderness: No complaints today. Continue to Monitor. Continue with Ice and Heat Therapy. 02/13/2023 5. Poor Appetite/ Frail: No complaints today. Hannah Scott states PCP following. Continue to monitor. 02/13/2023 6. Chronic Bilateral Knee Pain: No complaints today .Continue HEP as Tolerated. Continue to Monitor. 02/13/2023 7. Chronic Right  shoulder pain:  No complaints today.Continue HEP as tolerated. Continue to Monitor. 02/13/2023   F/U in 1 month  Telephone Visit Established Patient Location of Patient: In her Home Location of Provider: In the Office  Total Time Spent: 10 minutes

## 2023-02-21 DIAGNOSIS — J4540 Moderate persistent asthma, uncomplicated: Secondary | ICD-10-CM | POA: Diagnosis not present

## 2023-02-21 DIAGNOSIS — R0609 Other forms of dyspnea: Secondary | ICD-10-CM | POA: Diagnosis not present

## 2023-03-08 ENCOUNTER — Ambulatory Visit: Payer: Medicare Other | Admitting: Internal Medicine

## 2023-03-13 ENCOUNTER — Encounter: Payer: Self-pay | Admitting: Internal Medicine

## 2023-03-13 ENCOUNTER — Ambulatory Visit: Payer: Medicare Other | Attending: Internal Medicine | Admitting: Internal Medicine

## 2023-03-13 VITALS — BP 130/76 | HR 84 | Ht 60.0 in | Wt 131.0 lb

## 2023-03-13 DIAGNOSIS — I272 Pulmonary hypertension, unspecified: Secondary | ICD-10-CM

## 2023-03-13 DIAGNOSIS — I351 Nonrheumatic aortic (valve) insufficiency: Secondary | ICD-10-CM | POA: Diagnosis not present

## 2023-03-13 DIAGNOSIS — I739 Peripheral vascular disease, unspecified: Secondary | ICD-10-CM | POA: Diagnosis not present

## 2023-03-13 DIAGNOSIS — H5211 Myopia, right eye: Secondary | ICD-10-CM | POA: Diagnosis not present

## 2023-03-13 DIAGNOSIS — I25119 Atherosclerotic heart disease of native coronary artery with unspecified angina pectoris: Secondary | ICD-10-CM | POA: Diagnosis not present

## 2023-03-13 DIAGNOSIS — J9610 Chronic respiratory failure, unspecified whether with hypoxia or hypercapnia: Secondary | ICD-10-CM

## 2023-03-13 DIAGNOSIS — E782 Mixed hyperlipidemia: Secondary | ICD-10-CM

## 2023-03-13 NOTE — Progress Notes (Signed)
Cardiology Office Note:    Date:  03/13/2023   ID:  Hannah Scott, DOB July 03, 1950, MRN [id number]  PCP:  Pincus Sanes, MD   [facility]  Cardiologist:  Meriam Sprague, MD (Inactive)  Advanced Practice Provider:  No care team member to display Electrophysiologist:  None   Referring MD: Pincus Sanes, MD   Cc: DOD with shortness of breath  History of Present Illness:    Hannah Scott is a 72 y.o. female with a hx of prior MI (1991, 1) secondary to coronary vasospasm, known distal LAD disease, HTN, HLD, atrial septal aneurysm, and asthma who was previously followed by Dr. Delton See, then Dr. Shari Prows who presents to now see me.   Hannah Scott, a 72 year old individual with a history of coronary artery vasospasm, distal LAD disease, atrial septal aneurysm, hypertension, and hyperlipidemia, presents with persistent shortness of breath. The patient has had labile blood pressures and was unable to tolerate amlodipine. In 2023, she required home oxygen due to significant shortness of breath. Imaging from the same year raised questions about right ventricular dysfunction. Despite a negative stress test in 2021, there have been concerns about severe pulmonary hypertension related to her pulmonary disease.  The patient reports a history of asthma, with symptoms of wheezing and coughing. Recently, she has experienced increased shortness of breath, which she is unsure is due to her asthma or a cardiac issue. She reports that she uses oxygen frequently, especially when active.  The patient has a history of two heart attacks, which presented as back pain rather than chest pain. She has not had recent back pain. She has been on and off oxygen therapy, and currently uses it regularly, especially when active.  The patient's blood pressure has been labile, with readings as low as 105/66 and as high as 149/85. She has been taken off amlodipine due to hypotension. The patient also  reports leg swelling, which is not specified as being better or worse than usual.  The patient's EKG shows evidence of inferolateral ST depressions with T wave inversions, which are more prominent compared to her last EKG from 2023. The patient has been on torsemide, but there is uncertainty about whether the dosage needs to be increased.   Past Medical History:  Diagnosis Date   Anemia    hx   Arthritis    Asthma    PFTs, February, 2011, moderate obstructive disease with response to bronchodilators, normal lung volumes, moderate reduction in diffusing capacity   Atrial septal aneurysm    Echo, 2008-not noted on 13 echo   CAD (coronary artery disease)    90% distal LAD in the past  /   nuclear, 2008, no ischemia, ejection fraction 70%   Cancer (HCC) 2002   DUCTAL CIS--S/P LUMPECTOMY, RADIATION AND 6 WEEKS OF TAMOXIFEN   D-dimer, elevated    January, 2014   Depression    Ejection fraction    EF 60%, echo, October, 2008   Elevated CPK    January, 2014   Endometriosis 1989   RIGHT TUBE   Endometriosis 1987   LEFT TUBE/OVARY W FOCAL IN-SITU ENDOMETRIAL ADENOCARCINOMA   GERD (gastroesophageal reflux disease)    occ   H/O hiatal hernia    ?   Hyperlipidemia    Hypertension    Hypothyroidism    Patient has had in the past that she does not need treatment   Kyphoscoliosis    Obstructive airway disease (HCC)  Pinched nerve    lower back   Shortness of breath    Echo 2/22: EF 60-65, no RWMA, mild LVH, GR 1  DD, GLS -24%, normal RVSF, mild MR, trivial AI, borderline dilation of aortic root (39 mm)   UTI (lower urinary tract infection)     Past Surgical History:  Procedure Laterality Date   ANKLE SURGERY Left    ligament   APPENDECTOMY  1987   AT [facility]   BACK SURGERY     Fusion   BREAST LUMPECTOMY  2003   radiation on right   BREAST LUMPECTOMY WITH RADIOACTIVE SEED LOCALIZATION Left 02/12/2016   Procedure: LEFT BREAST LUMPECTOMY WITH RADIOACTIVE SEED LOCALIZATION;   Surgeon: Glenna Fellows, MD;  Location: [facility];  Service: General;  Laterality: Left;   CARDIOVASCULAR STRESS TEST  09/07/05   Nuclear, was negative   CATARACT EXTRACTION Bilateral 01,03   COLONOSCOPY WITH PROPOFOL N/A 05/04/2021   Procedure: COLONOSCOPY WITH PROPOFOL;  Surgeon: Iva Boop, MD;  Location: [facility];  Service: Endoscopy;  Laterality: N/A;   ESOPHAGOGASTRODUODENOSCOPY (EGD) WITH PROPOFOL N/A 05/04/2021   Procedure: ESOPHAGOGASTRODUODENOSCOPY (EGD) WITH PROPOFOL;  Surgeon: Iva Boop, MD;  Location: [facility];  Service: Endoscopy;  Laterality: N/A;   OOPHORECTOMY     LSO -RSO   PELVIC LAPAROSCOPY  1989   RSO, LYSIS OF ADHESIONS   POLYPECTOMY  05/04/2021   Procedure: POLYPECTOMY;  Surgeon: Iva Boop, MD;  Location: [facility];  Service: Endoscopy;;   TOTAL ABDOMINAL HYSTERECTOMY  1987   LSO, APPENDECTOMY   TOTAL HIP ARTHROPLASTY Right 10/28/2013   dr Ophelia Charter   TOTAL HIP ARTHROPLASTY Right 10/28/2013   Procedure: TOTAL HIP ARTHROPLASTY ANTERIOR APPROACH;  Surgeon: Eldred Manges, MD;  Location: [facility];  Service: Orthopedics;  Laterality: Right;  Right Total Hip Arthroplasty, Direct Anterior Approach    Current Medications: Current Meds  Medication Sig   albuterol (VENTOLIN HFA) 108 (90 Base) MCG/ACT inhaler Inhale 2 puffs into the lungs every 4 (four) hours as needed for wheezing or shortness of breath.   aspirin EC 81 MG tablet Take 1 tablet (81 mg total) by mouth daily. Swallow whole.   atorvastatin (LIPITOR) 40 MG tablet Take 1 tablet (40 mg total) by mouth daily.   bisoprolol (ZEBETA) 5 MG tablet TAKE 1 TABLET BY MOUTH DAILY   clobetasol cream (TEMOVATE) 0.05 % APPLY TO THE AFFECTED AREA TWICE DAILY   DULoxetine (CYMBALTA) 30 MG capsule TAKE ONE CAPSULE EVERY DAY IN ADDITION TO 60MG  CAPSULE FOR A TOTAL OF 90MG  DAILY   DULoxetine (CYMBALTA) 60 MG capsule TAKE ONE CAPSULE BY MOUTH EVERY DAY TAKE ALONG WITH 30MG  CAPSULE. TOTAL DAILY DOSE  is 90MG .   empagliflozin (JARDIANCE) 10 MG TABS tablet Take 1 tablet (10 mg total) by mouth daily before breakfast.   famotidine (PEPCID) 20 MG tablet TAKE ONE TABLET EVERY DAY AFTER SUPPER   fluticasone furoate-vilanterol (BREO ELLIPTA) 100-25 MCG/ACT AEPB INHALE 1 PUFF INTO THE LUNGS DAILY   furosemide (LASIX) 20 MG tablet Take 1 tablet (20 mg total) by mouth daily.   gabapentin (NEURONTIN) 600 MG tablet TAKE ONE TABLET BY MOUTH EVERY DAY AT BEDTIME   HYDROcodone-acetaminophen (NORCO) 10-325 MG tablet Take 1 tablet by mouth 3 (three) times daily as needed.   ipratropium-albuterol (DUONEB) 0.5-2.5 (3) MG/3ML SOLN Take 3 mLs by nebulization every 4 (four) hours as needed (wheezing or SOB). During exacerbation   irbesartan (AVAPRO) 75 MG tablet TAKE 1 TABLET(75 MG) BY  MOUTH DAILY   levothyroxine (SYNTHROID) 100 MCG tablet TAKE 1 TABLET(100 MCG) BY MOUTH DAILY   zaleplon (SONATA) 5 MG capsule Take 1 capsule (5 mg total) by mouth at bedtime as needed for sleep.     Allergies:   Fluticasone-salmeterol, Norvasc [amlodipine besylate], Tape, Heparin, and Morphine   Social History   Socioeconomic History   Marital status: Widowed    Spouse name: Not on file   Number of children: Not on file   Years of education: Not on file   Highest education level: Not on file  Occupational History   Not on file  Tobacco Use   Smoking status: Never    Passive exposure: Never   Smokeless tobacco: Never  Vaping Use   Vaping status: Never Used  Substance and Sexual Activity   Alcohol use: No    Alcohol/week: 0.0 standard drinks of alcohol   Drug use: No   Sexual activity: Not Currently    Partners: Male    Birth control/protection: Surgical  Other Topics Concern   Not on file  Social History Narrative   Widowed in 2015   2 sons 1 lives in the area the other is in close contact    1 grandchild   Never smoker no drug use no alcohol   Social Determinants of Health   Financial Resource Strain: Low  Risk  (01/11/2022)   Overall Financial Resource Strain (CARDIA)    Difficulty of Paying Living Expenses: Not hard at all  Food Insecurity: No Food Insecurity (01/11/2022)   Hunger Vital Sign    Worried About Running Out of Food in the Last Year: Never true    Ran Out of Food in the Last Year: Never true  Transportation Needs: No Transportation Needs (01/11/2022)   PRAPARE - Administrator, Civil Service (Medical): No    Lack of Transportation (Non-Medical): No  Physical Activity: Inactive (01/11/2022)   Exercise Vital Sign    Days of Exercise per Week: 0 days    Minutes of Exercise per Session: 0 min  Stress: No Stress Concern Present (01/11/2022)   [employer] of Occupational Health - Occupational Stress Questionnaire    Feeling of Stress : Not at all  Social Connections: Socially Integrated (01/11/2022)   Social Connection and Isolation Panel [NHANES]    Frequency of Communication with Friends and Family: More than three times a week    Frequency of Social Gatherings with Friends and Family: More than three times a week    Attends Religious Services: More than 4 times per year    Active Member of [employer] or Organizations: Yes    Attends Engineer, structural: More than 4 times per year    Marital Status: Married     Family History: The patient's  family history includes Asthma in her paternal uncle; Heart attack in her father and paternal uncle; Heart disease in her father and mother; Heart failure in her mother; Hypertension in her maternal grandfather and mother; Pneumonia in her mother; Stroke in her maternal grandfather.  ROS:   Please see the history of present illness.      EKGs/Labs/Other Studies Reviewed:    The following studies were reviewed today: Cardiac Studies & Procedures     STRESS TESTS  MYOCARDIAL PERFUSION IMAGING 12/26/2019  Narrative  The left ventricular ejection fraction is normal (55-65%).  Nuclear stress EF: 64%. No wall  motion abnormalities  There was no ST segment deviation noted during stress.  The  study is normal.  This is a low risk study. There is no infarct or ischemia identified.  Donato Schultz, MD   ECHOCARDIOGRAM  ECHOCARDIOGRAM COMPLETE 04/14/2022  Narrative ECHOCARDIOGRAM REPORT    Patient Name:   ELEESA VANAKEN Paul   Date of Exam: 04/14/2022 Medical Rec #:  [id number]     Height:       60.0 in Accession #:    [id number]    Weight:       136.0 lb Date of Birth:  1951/01/21     BSA:          1.584 m Patient Age:    71 years      BP:           143/80 mmHg Patient Gender: F             HR:           77 bpm. Exam Location:  [address]  Procedure: 2D Echo, Cardiac Doppler and Color Doppler  Indications:    I27.20 Pulmonary Hypertension  History:        Patient has prior history of Echocardiogram examinations, most recent 09/25/2021. Previous Myocardial Infarction and CAD, Signs/Symptoms:Shortness of Breath; Risk Factors:Hypertension and Dyslipidemia. Atrial septal aneurysm. Hypotension. Asthma. Septic shock. Kyphoscoliosis.  Sonographer:    Cathie Beams RCS Referring Phys: [id number] Herbert Seta E [place]   Sonographer Comments: Technically difficult to position patient. IMPRESSIONS   1. Left ventricular ejection fraction, by estimation, is 60 to 65%. The left ventricle has normal function. The left ventricle has no regional wall motion abnormalities. There is moderate asymmetric left ventricular hypertrophy of the septal segment. Left ventricular diastolic parameters are consistent with Grade I diastolic dysfunction (impaired relaxation). Elevated left ventricular end-diastolic pressure. 2. Right ventricular systolic function is normal. The right ventricular size is mildly enlarged. There is moderately elevated pulmonary artery systolic pressure. 3. Right atrial size was severely dilated. 4. The mitral valve is normal in structure. Mild mitral valve regurgitation. No evidence of mitral  stenosis. 5. The aortic valve is calcified. Aortic valve regurgitation is mild. No aortic stenosis is present. 6. Pulmonic valve regurgitation is moderate. 7. Aortic dilatation noted. There is mild dilatation of the ascending aorta, measuring 37 mm. 8. The inferior vena cava is normal in size with <50% respiratory variability, suggesting right atrial pressure of 8 mmHg.  FINDINGS Left Ventricle: Left ventricular ejection fraction, by estimation, is 60 to 65%. The left ventricle has normal function. The left ventricle has no regional wall motion abnormalities. The left ventricular internal cavity size was normal in size. There is moderate asymmetric left ventricular hypertrophy of the septal segment. Left ventricular diastolic parameters are consistent with Grade I diastolic dysfunction (impaired relaxation). Elevated left ventricular end-diastolic pressure.  Right Ventricle: The right ventricular size is mildly enlarged. No increase in right ventricular wall thickness. Right ventricular systolic function is normal. There is moderately elevated pulmonary artery systolic pressure. The tricuspid regurgitant velocity is 3.17 m/s, and with an assumed right atrial pressure of 8 mmHg, the estimated right ventricular systolic pressure is 48.2 mmHg.  Left Atrium: Left atrial size was normal in size.  Right Atrium: Right atrial size was severely dilated.  Pericardium: There is no evidence of pericardial effusion.  Mitral Valve: The mitral valve is normal in structure. Mild mitral annular calcification. Mild mitral valve regurgitation. No evidence of mitral valve stenosis.  Tricuspid Valve: The tricuspid valve is normal in structure. Tricuspid valve regurgitation is mild . No evidence of  tricuspid stenosis.  Aortic Valve: The aortic valve is calcified. Aortic valve regurgitation is mild. No aortic stenosis is present.  Pulmonic Valve: The pulmonic valve was normal in structure. Pulmonic valve  regurgitation is moderate. No evidence of pulmonic stenosis.  Aorta: The aortic root is normal in size and structure and aortic dilatation noted. There is mild dilatation of the ascending aorta, measuring 37 mm.  Venous: The inferior vena cava is normal in size with less than 50% respiratory variability, suggesting right atrial pressure of 8 mmHg.  IAS/Shunts: No atrial level shunt detected by color flow Doppler.   LEFT VENTRICLE PLAX 2D LVIDd:         3.80 cm   Diastology LVIDs:         2.10 cm   LV e' medial:    3.73 cm/s LV PW:         1.00 cm   LV E/e' medial:  15.7 LV IVS:        1.60 cm   LV e' lateral:   7.95 cm/s LVOT diam:     2.20 cm   LV E/e' lateral: 7.4 LV SV:         64 LV SV Index:   40 LVOT Area:     3.80 cm   RIGHT VENTRICLE RV Basal diam:  3.50 cm RV S prime:     8.11 cm/s TAPSE (M-mode): 1.5 cm RVSP:           48.2 mmHg  LEFT ATRIUM           Index        RIGHT ATRIUM           Index LA diam:      4.00 cm 2.52 cm/m   RA Pressure: 8.00 mmHg LA Vol (A2C): 28.9 ml 18.24 ml/m  RA Area:     21.60 cm LA Vol (A4C): 34.7 ml 21.90 ml/m  RA Volume:   66.00 ml  41.66 ml/m AORTIC VALVE             PULMONIC VALVE LVOT Vmax:   86.70 cm/s  PR End Diast Vel: 11.02 msec LVOT Vmean:  44.900 cm/s LVOT VTI:    0.168 m  AORTA Ao Root diam: 3.40 cm Ao Asc diam:  3.70 cm  MITRAL VALVE               TRICUSPID VALVE MV Area (PHT): 4.74 cm    TR Peak grad:   40.2 mmHg MV Decel Time: 160 msec    TR Vmax:        317.00 cm/s MR Peak grad: 52.7 mmHg    Estimated RAP:  8.00 mmHg MR Mean grad: 27.0 mmHg    RVSP:           48.2 mmHg MR Vmax:      363.00 cm/s MR Vmean:     239.0 cm/s   SHUNTS MV E velocity: 58.60 cm/s  Systemic VTI:  0.17 m MV A velocity: 99.70 cm/s  Systemic Diam: 2.20 cm MV E/A ratio:  0.59  Chilton Si MD Electronically signed by Chilton Si MD Signature Date/Time: 04/14/2022/3:54:08 PM    Final              Recent Labs: 12/16/2022:  Magnesium 2.0; Pro B Natriuretic peptide (BNP) 1,285.0 02/07/2023: ALT 30; BUN 21; Creatinine, Ser 0.75; Hemoglobin 13.3; Platelets 203.0; Potassium 3.9; Sodium 141; TSH 0.59   Recent Lipid Panel    Component Value Date/Time  CHOL 127 02/07/2023 1149   CHOL 118 04/14/2022 0859   TRIG 67.0 02/07/2023 1149   HDL 58.10 02/07/2023 1149   HDL 60 04/14/2022 0859   CHOLHDL 2 02/07/2023 1149   VLDL 13.4 02/07/2023 1149   LDLCALC 56 02/07/2023 1149   LDLCALC 45 04/14/2022 0859   LDLCALC 61 12/13/2019 1430   LDLDIRECT 154.8 02/20/2009 1116   EKG: Inferolateral ST depressions with T wave inversions (03/13/2023)  Risk Assessment/Calculations:     REVEAL 2.0 Risk Score of 7 pending BNP 320 m  Physical Exam:    VS:  BP 130/76   Pulse 84   Ht 5' (1.524 m)   Wt 131 lb (59.4 kg)   SpO2 95%   BMI 25.58 kg/m     Wt Readings from Last 3 Encounters:  03/13/23 131 lb (59.4 kg)  02/13/23 134 lb (60.8 kg)  02/07/23 139 lb (63 kg)     GEN:  Elderly female, Comfortable prior to HEENT: Normal NECK: No JVD;  Scoliosis present CARDIAC: RR, 2/6 systolic murmur. RV heave noted +2 radial RESPIRATORY:  Diminished but clear ABDOMEN: Soft, non-tender, non-distended MUSCULOSKELETAL:  No edema, warm SKIN: Warm and dry NEUROLOGIC:  Alert and oriented x 3 PSYCHIATRIC:  Normal affect   ASSESSMENT:    1. Severe pulmonary hypertension (HCC)   2. Mild aortic regurgitation   3. Coronary artery disease involving native coronary artery of native heart with angina pectoris (HCC)   4. Vasospasm (HCC)   5. Chronic respiratory failure, unspecified whether with hypoxia or hypercapnia (HCC)   6. Mixed hyperlipidemia    PLAN:    Shortness of Breath - Persistent shortness of breath with coronary artery disease, pulmonary hypertension, and restrictive lung disease. Differential includes cardiac and pulmonary etiologies. Recent EKG shows inferolateral ST depressions with T wave inversions.  Six-minute walk distance is 320 meters. Discussed left and right heart catheterization to evaluate for pulmonary hypertension and potential coronary artery blockages. Risks include damage and bleeding, unique risk include coronary vasospasm. Benefits include identifying treatable causes of symptoms. If significant pulmonary hypertension is confirmed, referral to advanced heart failure team is planned. - Order basic metabolic panel and basic natriuretic peptide - Schedule left and right heart catheterization  Pulmonary Hypertension - Suspect mixed with mild AI, HFpEF and both obstructive and restriction disease - Severe pulmonary hypertension with right ventricular dysfunction (WHO II and III suspected with NYHA III-IV). I - Intermediate risk based on Reveal Lite score of 7.  Coronary Artery Disease - Coronary artery disease with distal LAD disease and coronary artery vasospasm. Worsening ST changes on EKG with hx ox of atypical angia - Continue current medications - Schedule left heart catheterization  Obstructive and Restrictive Lung Disease - Asthma and restrictive lung disease. Requires intermittent home oxygen. Discussed ongoing pulmonary management and follow-up with pulmonologist Dr. Delton Coombes.  Acute on Chronic HFpEF Hypertension Labile blood pressure with readings from 105/66 to 149/85. Average 121/73. Unable to tolerate amlodipine due to hypotension. Discussed close monitoring without adding new antihypertensive medications. - restarting her SGLT2i; she may be a candidate for [employer] Well grant  Hyperlipidemia Well-controlled hyperlipidemia. - Continue current lipid-lowering therapy  Will decide f/u s/p heart catheterization  Time Spent Directly with Patient:   I have spent a total of 68 minutes with the patient reviewing notes, imaging, EKGs, labs, personally performing her 6 minute walk distance and examining the patient as well as establishing an assessment and plan that was  discussed personally with the  patient. Discussed disease state education using cardiac modeling.   Medication Adjustments/Labs and Tests Ordered: Current medicines are reviewed at length with the patient today.  Concerns regarding medicines are outlined above.   Orders Placed This Encounter  Procedures   Basic metabolic panel   CBC   Pro b natriuretic peptide (BNP)   EKG 12-Lead   No orders of the defined types were placed in this encounter.  Patient Instructions  Medication Instructions:  Your physician recommends that you continue on your current medications as directed. Please refer to the Current Medication list given to you today.  *If you need a refill on your cardiac medications before your next appointment, please call your pharmacy*   Lab Work: CBC, BMP, BNP  If you have labs (blood work) drawn today and your tests are completely normal, you will receive your results only by: MyChart Message (if you have MyChart) OR A paper copy in the mail If you have any lab test that is abnormal or we need to change your treatment, we will call you to review the results.   Testing/Procedures: Your physician has requested that you have a cardiac catheterization. Cardiac catheterization is used to diagnose and/or treat various heart conditions. Doctors may recommend this procedure for a number of different reasons. The most common reason is to evaluate chest pain. Chest pain can be a symptom of coronary artery disease (CAD), and cardiac catheterization can show whether plaque is narrowing or blocking your heart's arteries. This procedure is also used to evaluate the valves, as well as measure the blood flow and oxygen levels in different parts of your heart. For further information please visit [email or web address] Please follow instruction sheet, as given.    Follow-Up:To be determined At [facility], you and your health needs are our priority.  As part of our continuing  mission to provide you with exceptional heart care, we have created designated Provider Care Teams.  These Care Teams include your primary Cardiologist (physician) and Advanced Practice Providers (APPs -  Physician Assistants and Nurse Practitioners) who all work together to provide you with the care you need, when you need it.   Provider:   Riley Lam, MD    [facility] Stumpf,acting as a scribe for Christell Constant, MD.,have documented all relevant documentation on the behalf of Christell Constant, MD,as directed by  Christell Constant, MD while in the presence of Christell Constant, MD.  I, Christell Constant, MD, have reviewed all documentation for this visit. The documentation on 03/13/23 for the exam, diagnosis, procedures, and orders are all accurate and complete.    Signed, Christell Constant, MD  03/13/2023 12:12 PM    [facility]

## 2023-03-13 NOTE — Patient Instructions (Signed)
Medication Instructions:  Your physician recommends that you continue on your current medications as directed. Please refer to the Current Medication list given to you today.  *If you need a refill on your cardiac medications before your next appointment, please call your pharmacy*   Lab Work: CBC, BMP, BNP  If you have labs (blood work) drawn today and your tests are completely normal, you will receive your results only by: MyChart Message (if you have MyChart) OR A paper copy in the mail If you have any lab test that is abnormal or we need to change your treatment, we will call you to review the results.   Testing/Procedures: Your physician has requested that you have a cardiac catheterization. Cardiac catheterization is used to diagnose and/or treat various heart conditions. Doctors may recommend this procedure for a number of different reasons. The most common reason is to evaluate chest pain. Chest pain can be a symptom of coronary artery disease (CAD), and cardiac catheterization can show whether plaque is narrowing or blocking your heart's arteries. This procedure is also used to evaluate the valves, as well as measure the blood flow and oxygen levels in different parts of your heart. For further information please visit [email or web address] Please follow instruction sheet, as given.    Follow-Up:To be determined At [facility], you and your health needs are our priority.  As part of our continuing mission to provide you with exceptional heart care, we have created designated Provider Care Teams.  These Care Teams include your primary Cardiologist (physician) and Advanced Practice Providers (APPs -  Physician Assistants and Nurse Practitioners) who all work together to provide you with the care you need, when you need it.   Provider:   Riley Lam, MD

## 2023-03-13 NOTE — H&P (View-Only) (Signed)
 Cardiology Office Note:    Date:  03/13/2023   ID:  Hannah Scott, DOB July 03, 1950, MRN [id number]  PCP:  Pincus Sanes, MD   [facility]  Cardiologist:  Meriam Sprague, MD (Inactive)  Advanced Practice Provider:  No care team member to display Electrophysiologist:  None   Referring MD: Pincus Sanes, MD   Cc: DOD with shortness of breath  History of Present Illness:    Hannah Scott is a 72 y.o. female with a hx of prior MI (1991, 1) secondary to coronary vasospasm, known distal LAD disease, HTN, HLD, atrial septal aneurysm, and asthma who was previously followed by Dr. Delton See, then Dr. Shari Prows who presents to now see me.   Hannah Scott, a 72 year old individual with a history of coronary artery vasospasm, distal LAD disease, atrial septal aneurysm, hypertension, and hyperlipidemia, presents with persistent shortness of breath. The patient has had labile blood pressures and was unable to tolerate amlodipine. In 2023, she required home oxygen due to significant shortness of breath. Imaging from the same year raised questions about right ventricular dysfunction. Despite a negative stress test in 2021, there have been concerns about severe pulmonary hypertension related to her pulmonary disease.  The patient reports a history of asthma, with symptoms of wheezing and coughing. Recently, she has experienced increased shortness of breath, which she is unsure is due to her asthma or a cardiac issue. She reports that she uses oxygen frequently, especially when active.  The patient has a history of two heart attacks, which presented as back pain rather than chest pain. She has not had recent back pain. She has been on and off oxygen therapy, and currently uses it regularly, especially when active.  The patient's blood pressure has been labile, with readings as low as 105/66 and as high as 149/85. She has been taken off amlodipine due to hypotension. The patient also  reports leg swelling, which is not specified as being better or worse than usual.  The patient's EKG shows evidence of inferolateral ST depressions with T wave inversions, which are more prominent compared to her last EKG from 2023. The patient has been on torsemide, but there is uncertainty about whether the dosage needs to be increased.   Past Medical History:  Diagnosis Date   Anemia    hx   Arthritis    Asthma    PFTs, February, 2011, moderate obstructive disease with response to bronchodilators, normal lung volumes, moderate reduction in diffusing capacity   Atrial septal aneurysm    Echo, 2008-not noted on 13 echo   CAD (coronary artery disease)    90% distal LAD in the past  /   nuclear, 2008, no ischemia, ejection fraction 70%   Cancer (HCC) 2002   DUCTAL CIS--S/P LUMPECTOMY, RADIATION AND 6 WEEKS OF TAMOXIFEN   D-dimer, elevated    January, 2014   Depression    Ejection fraction    EF 60%, echo, October, 2008   Elevated CPK    January, 2014   Endometriosis 1989   RIGHT TUBE   Endometriosis 1987   LEFT TUBE/OVARY W FOCAL IN-SITU ENDOMETRIAL ADENOCARCINOMA   GERD (gastroesophageal reflux disease)    occ   H/O hiatal hernia    ?   Hyperlipidemia    Hypertension    Hypothyroidism    Patient has had in the past that she does not need treatment   Kyphoscoliosis    Obstructive airway disease (HCC)  Pinched nerve    lower back   Shortness of breath    Echo 2/22: EF 60-65, no RWMA, mild LVH, GR 1  DD, GLS -24%, normal RVSF, mild MR, trivial AI, borderline dilation of aortic root (39 mm)   UTI (lower urinary tract infection)     Past Surgical History:  Procedure Laterality Date   ANKLE SURGERY Left    ligament   APPENDECTOMY  1987   AT [facility]   BACK SURGERY     Fusion   BREAST LUMPECTOMY  2003   radiation on right   BREAST LUMPECTOMY WITH RADIOACTIVE SEED LOCALIZATION Left 02/12/2016   Procedure: LEFT BREAST LUMPECTOMY WITH RADIOACTIVE SEED LOCALIZATION;   Surgeon: Glenna Fellows, MD;  Location: [facility];  Service: General;  Laterality: Left;   CARDIOVASCULAR STRESS TEST  09/07/05   Nuclear, was negative   CATARACT EXTRACTION Bilateral 01,03   COLONOSCOPY WITH PROPOFOL N/A 05/04/2021   Procedure: COLONOSCOPY WITH PROPOFOL;  Surgeon: Iva Boop, MD;  Location: [facility];  Service: Endoscopy;  Laterality: N/A;   ESOPHAGOGASTRODUODENOSCOPY (EGD) WITH PROPOFOL N/A 05/04/2021   Procedure: ESOPHAGOGASTRODUODENOSCOPY (EGD) WITH PROPOFOL;  Surgeon: Iva Boop, MD;  Location: [facility];  Service: Endoscopy;  Laterality: N/A;   OOPHORECTOMY     LSO -RSO   PELVIC LAPAROSCOPY  1989   RSO, LYSIS OF ADHESIONS   POLYPECTOMY  05/04/2021   Procedure: POLYPECTOMY;  Surgeon: Iva Boop, MD;  Location: [facility];  Service: Endoscopy;;   TOTAL ABDOMINAL HYSTERECTOMY  1987   LSO, APPENDECTOMY   TOTAL HIP ARTHROPLASTY Right 10/28/2013   dr Ophelia Charter   TOTAL HIP ARTHROPLASTY Right 10/28/2013   Procedure: TOTAL HIP ARTHROPLASTY ANTERIOR APPROACH;  Surgeon: Eldred Manges, MD;  Location: [facility];  Service: Orthopedics;  Laterality: Right;  Right Total Hip Arthroplasty, Direct Anterior Approach    Current Medications: Current Meds  Medication Sig   albuterol (VENTOLIN HFA) 108 (90 Base) MCG/ACT inhaler Inhale 2 puffs into the lungs every 4 (four) hours as needed for wheezing or shortness of breath.   aspirin EC 81 MG tablet Take 1 tablet (81 mg total) by mouth daily. Swallow whole.   atorvastatin (LIPITOR) 40 MG tablet Take 1 tablet (40 mg total) by mouth daily.   bisoprolol (ZEBETA) 5 MG tablet TAKE 1 TABLET BY MOUTH DAILY   clobetasol cream (TEMOVATE) 0.05 % APPLY TO THE AFFECTED AREA TWICE DAILY   DULoxetine (CYMBALTA) 30 MG capsule TAKE ONE CAPSULE EVERY DAY IN ADDITION TO 60MG  CAPSULE FOR A TOTAL OF 90MG  DAILY   DULoxetine (CYMBALTA) 60 MG capsule TAKE ONE CAPSULE BY MOUTH EVERY DAY TAKE ALONG WITH 30MG  CAPSULE. TOTAL DAILY DOSE  is 90MG .   empagliflozin (JARDIANCE) 10 MG TABS tablet Take 1 tablet (10 mg total) by mouth daily before breakfast.   famotidine (PEPCID) 20 MG tablet TAKE ONE TABLET EVERY DAY AFTER SUPPER   fluticasone furoate-vilanterol (BREO ELLIPTA) 100-25 MCG/ACT AEPB INHALE 1 PUFF INTO THE LUNGS DAILY   furosemide (LASIX) 20 MG tablet Take 1 tablet (20 mg total) by mouth daily.   gabapentin (NEURONTIN) 600 MG tablet TAKE ONE TABLET BY MOUTH EVERY DAY AT BEDTIME   HYDROcodone-acetaminophen (NORCO) 10-325 MG tablet Take 1 tablet by mouth 3 (three) times daily as needed.   ipratropium-albuterol (DUONEB) 0.5-2.5 (3) MG/3ML SOLN Take 3 mLs by nebulization every 4 (four) hours as needed (wheezing or SOB). During exacerbation   irbesartan (AVAPRO) 75 MG tablet TAKE 1 TABLET(75 MG) BY  MOUTH DAILY   levothyroxine (SYNTHROID) 100 MCG tablet TAKE 1 TABLET(100 MCG) BY MOUTH DAILY   zaleplon (SONATA) 5 MG capsule Take 1 capsule (5 mg total) by mouth at bedtime as needed for sleep.     Allergies:   Fluticasone-salmeterol, Norvasc [amlodipine besylate], Tape, Heparin, and Morphine   Social History   Socioeconomic History   Marital status: Widowed    Spouse name: Not on file   Number of children: Not on file   Years of education: Not on file   Highest education level: Not on file  Occupational History   Not on file  Tobacco Use   Smoking status: Never    Passive exposure: Never   Smokeless tobacco: Never  Vaping Use   Vaping status: Never Used  Substance and Sexual Activity   Alcohol use: No    Alcohol/week: 0.0 standard drinks of alcohol   Drug use: No   Sexual activity: Not Currently    Partners: Male    Birth control/protection: Surgical  Other Topics Concern   Not on file  Social History Narrative   Widowed in 2015   2 sons 1 lives in the area the other is in close contact    1 grandchild   Never smoker no drug use no alcohol   Social Determinants of Health   Financial Resource Strain: Low  Risk  (01/11/2022)   Overall Financial Resource Strain (CARDIA)    Difficulty of Paying Living Expenses: Not hard at all  Food Insecurity: No Food Insecurity (01/11/2022)   Hunger Vital Sign    Worried About Running Out of Food in the Last Year: Never true    Ran Out of Food in the Last Year: Never true  Transportation Needs: No Transportation Needs (01/11/2022)   PRAPARE - Administrator, Civil Service (Medical): No    Lack of Transportation (Non-Medical): No  Physical Activity: Inactive (01/11/2022)   Exercise Vital Sign    Days of Exercise per Week: 0 days    Minutes of Exercise per Session: 0 min  Stress: No Stress Concern Present (01/11/2022)   [employer] of Occupational Health - Occupational Stress Questionnaire    Feeling of Stress : Not at all  Social Connections: Socially Integrated (01/11/2022)   Social Connection and Isolation Panel [NHANES]    Frequency of Communication with Friends and Family: More than three times a week    Frequency of Social Gatherings with Friends and Family: More than three times a week    Attends Religious Services: More than 4 times per year    Active Member of [employer] or Organizations: Yes    Attends Engineer, structural: More than 4 times per year    Marital Status: Married     Family History: The patient's  family history includes Asthma in her paternal uncle; Heart attack in her father and paternal uncle; Heart disease in her father and mother; Heart failure in her mother; Hypertension in her maternal grandfather and mother; Pneumonia in her mother; Stroke in her maternal grandfather.  ROS:   Please see the history of present illness.      EKGs/Labs/Other Studies Reviewed:    The following studies were reviewed today: Cardiac Studies & Procedures     STRESS TESTS  MYOCARDIAL PERFUSION IMAGING 12/26/2019  Narrative  The left ventricular ejection fraction is normal (55-65%).  Nuclear stress EF: 64%. No wall  motion abnormalities  There was no ST segment deviation noted during stress.  The  study is normal.  This is a low risk study. There is no infarct or ischemia identified.  Donato Schultz, MD   ECHOCARDIOGRAM  ECHOCARDIOGRAM COMPLETE 04/14/2022  Narrative ECHOCARDIOGRAM REPORT    Patient Name:   Hannah Scott   Date of Exam: 04/14/2022 Medical Rec #:  [id number]     Height:       60.0 in Accession #:    [id number]    Weight:       136.0 lb Date of Birth:  1951/01/21     BSA:          1.584 m Patient Age:    71 years      BP:           143/80 mmHg Patient Gender: F             HR:           77 bpm. Exam Location:  [address]  Procedure: 2D Echo, Cardiac Doppler and Color Doppler  Indications:    I27.20 Pulmonary Hypertension  History:        Patient has prior history of Echocardiogram examinations, most recent 09/25/2021. Previous Myocardial Infarction and CAD, Signs/Symptoms:Shortness of Breath; Risk Factors:Hypertension and Dyslipidemia. Atrial septal aneurysm. Hypotension. Asthma. Septic shock. Kyphoscoliosis.  Sonographer:    Cathie Beams RCS Referring Phys: [id number] Herbert Seta E [place]   Sonographer Comments: Technically difficult to position patient. IMPRESSIONS   1. Left ventricular ejection fraction, by estimation, is 60 to 65%. The left ventricle has normal function. The left ventricle has no regional wall motion abnormalities. There is moderate asymmetric left ventricular hypertrophy of the septal segment. Left ventricular diastolic parameters are consistent with Grade I diastolic dysfunction (impaired relaxation). Elevated left ventricular end-diastolic pressure. 2. Right ventricular systolic function is normal. The right ventricular size is mildly enlarged. There is moderately elevated pulmonary artery systolic pressure. 3. Right atrial size was severely dilated. 4. The mitral valve is normal in structure. Mild mitral valve regurgitation. No evidence of mitral  stenosis. 5. The aortic valve is calcified. Aortic valve regurgitation is mild. No aortic stenosis is present. 6. Pulmonic valve regurgitation is moderate. 7. Aortic dilatation noted. There is mild dilatation of the ascending aorta, measuring 37 mm. 8. The inferior vena cava is normal in size with <50% respiratory variability, suggesting right atrial pressure of 8 mmHg.  FINDINGS Left Ventricle: Left ventricular ejection fraction, by estimation, is 60 to 65%. The left ventricle has normal function. The left ventricle has no regional wall motion abnormalities. The left ventricular internal cavity size was normal in size. There is moderate asymmetric left ventricular hypertrophy of the septal segment. Left ventricular diastolic parameters are consistent with Grade I diastolic dysfunction (impaired relaxation). Elevated left ventricular end-diastolic pressure.  Right Ventricle: The right ventricular size is mildly enlarged. No increase in right ventricular wall thickness. Right ventricular systolic function is normal. There is moderately elevated pulmonary artery systolic pressure. The tricuspid regurgitant velocity is 3.17 m/s, and with an assumed right atrial pressure of 8 mmHg, the estimated right ventricular systolic pressure is 48.2 mmHg.  Left Atrium: Left atrial size was normal in size.  Right Atrium: Right atrial size was severely dilated.  Pericardium: There is no evidence of pericardial effusion.  Mitral Valve: The mitral valve is normal in structure. Mild mitral annular calcification. Mild mitral valve regurgitation. No evidence of mitral valve stenosis.  Tricuspid Valve: The tricuspid valve is normal in structure. Tricuspid valve regurgitation is mild . No evidence of  tricuspid stenosis.  Aortic Valve: The aortic valve is calcified. Aortic valve regurgitation is mild. No aortic stenosis is present.  Pulmonic Valve: The pulmonic valve was normal in structure. Pulmonic valve  regurgitation is moderate. No evidence of pulmonic stenosis.  Aorta: The aortic root is normal in size and structure and aortic dilatation noted. There is mild dilatation of the ascending aorta, measuring 37 mm.  Venous: The inferior vena cava is normal in size with less than 50% respiratory variability, suggesting right atrial pressure of 8 mmHg.  IAS/Shunts: No atrial level shunt detected by color flow Doppler.   LEFT VENTRICLE PLAX 2D LVIDd:         3.80 cm   Diastology LVIDs:         2.10 cm   LV e' medial:    3.73 cm/s LV PW:         1.00 cm   LV E/e' medial:  15.7 LV IVS:        1.60 cm   LV e' lateral:   7.95 cm/s LVOT diam:     2.20 cm   LV E/e' lateral: 7.4 LV SV:         64 LV SV Index:   40 LVOT Area:     3.80 cm   RIGHT VENTRICLE RV Basal diam:  3.50 cm RV S prime:     8.11 cm/s TAPSE (M-mode): 1.5 cm RVSP:           48.2 mmHg  LEFT ATRIUM           Index        RIGHT ATRIUM           Index LA diam:      4.00 cm 2.52 cm/m   RA Pressure: 8.00 mmHg LA Vol (A2C): 28.9 ml 18.24 ml/m  RA Area:     21.60 cm LA Vol (A4C): 34.7 ml 21.90 ml/m  RA Volume:   66.00 ml  41.66 ml/m AORTIC VALVE             PULMONIC VALVE LVOT Vmax:   86.70 cm/s  PR End Diast Vel: 11.02 msec LVOT Vmean:  44.900 cm/s LVOT VTI:    0.168 m  AORTA Ao Root diam: 3.40 cm Ao Asc diam:  3.70 cm  MITRAL VALVE               TRICUSPID VALVE MV Area (PHT): 4.74 cm    TR Peak grad:   40.2 mmHg MV Decel Time: 160 msec    TR Vmax:        317.00 cm/s MR Peak grad: 52.7 mmHg    Estimated RAP:  8.00 mmHg MR Mean grad: 27.0 mmHg    RVSP:           48.2 mmHg MR Vmax:      363.00 cm/s MR Vmean:     239.0 cm/s   SHUNTS MV E velocity: 58.60 cm/s  Systemic VTI:  0.17 m MV A velocity: 99.70 cm/s  Systemic Diam: 2.20 cm MV E/A ratio:  0.59  Chilton Si MD Electronically signed by Chilton Si MD Signature Date/Time: 04/14/2022/3:54:08 PM    Final              Recent Labs: 12/16/2022:  Magnesium 2.0; Pro B Natriuretic peptide (BNP) 1,285.0 02/07/2023: ALT 30; BUN 21; Creatinine, Ser 0.75; Hemoglobin 13.3; Platelets 203.0; Potassium 3.9; Sodium 141; TSH 0.59   Recent Lipid Panel    Component Value Date/Time  CHOL 127 02/07/2023 1149   CHOL 118 04/14/2022 0859   TRIG 67.0 02/07/2023 1149   HDL 58.10 02/07/2023 1149   HDL 60 04/14/2022 0859   CHOLHDL 2 02/07/2023 1149   VLDL 13.4 02/07/2023 1149   LDLCALC 56 02/07/2023 1149   LDLCALC 45 04/14/2022 0859   LDLCALC 61 12/13/2019 1430   LDLDIRECT 154.8 02/20/2009 1116   EKG: Inferolateral ST depressions with T wave inversions (03/13/2023)  Risk Assessment/Calculations:     REVEAL 2.0 Risk Score of 7 pending BNP 320 m  Physical Exam:    VS:  BP 130/76   Pulse 84   Ht 5' (1.524 m)   Wt 131 lb (59.4 kg)   SpO2 95%   BMI 25.58 kg/m     Wt Readings from Last 3 Encounters:  03/13/23 131 lb (59.4 kg)  02/13/23 134 lb (60.8 kg)  02/07/23 139 lb (63 kg)     GEN:  Elderly female, Comfortable prior to HEENT: Normal NECK: No JVD;  Scoliosis present CARDIAC: RR, 2/6 systolic murmur. RV heave noted +2 radial RESPIRATORY:  Diminished but clear ABDOMEN: Soft, non-tender, non-distended MUSCULOSKELETAL:  No edema, warm SKIN: Warm and dry NEUROLOGIC:  Alert and oriented x 3 PSYCHIATRIC:  Normal affect   ASSESSMENT:    1. Severe pulmonary hypertension (HCC)   2. Mild aortic regurgitation   3. Coronary artery disease involving native coronary artery of native heart with angina pectoris (HCC)   4. Vasospasm (HCC)   5. Chronic respiratory failure, unspecified whether with hypoxia or hypercapnia (HCC)   6. Mixed hyperlipidemia    PLAN:    Shortness of Breath - Persistent shortness of breath with coronary artery disease, pulmonary hypertension, and restrictive lung disease. Differential includes cardiac and pulmonary etiologies. Recent EKG shows inferolateral ST depressions with T wave inversions.  Six-minute walk distance is 320 meters. Discussed left and right heart catheterization to evaluate for pulmonary hypertension and potential coronary artery blockages. Risks include damage and bleeding, unique risk include coronary vasospasm. Benefits include identifying treatable causes of symptoms. If significant pulmonary hypertension is confirmed, referral to advanced heart failure team is planned. - Order basic metabolic panel and basic natriuretic peptide - Schedule left and right heart catheterization  Pulmonary Hypertension - Suspect mixed with mild AI, HFpEF and both obstructive and restriction disease - Severe pulmonary hypertension with right ventricular dysfunction (WHO II and III suspected with NYHA III-IV). I - Intermediate risk based on Reveal Lite score of 7.  Coronary Artery Disease - Coronary artery disease with distal LAD disease and coronary artery vasospasm. Worsening ST changes on EKG with hx ox of atypical angia - Continue current medications - Schedule left heart catheterization  Obstructive and Restrictive Lung Disease - Asthma and restrictive lung disease. Requires intermittent home oxygen. Discussed ongoing pulmonary management and follow-up with pulmonologist Dr. Delton Coombes.  Acute on Chronic HFpEF Hypertension Labile blood pressure with readings from 105/66 to 149/85. Average 121/73. Unable to tolerate amlodipine due to hypotension. Discussed close monitoring without adding new antihypertensive medications. - restarting her SGLT2i; she may be a candidate for Health Well grant  Hyperlipidemia Well-controlled hyperlipidemia. - Continue current lipid-lowering therapy  Will decide f/u s/p heart catheterization  Time Spent Directly with Patient:   I have spent a total of 68 minutes with the patient reviewing notes, imaging, EKGs, labs, personally performing her 6 minute walk distance and examining the patient as well as establishing an assessment and plan that was  discussed personally with the  patient. Discussed disease state education using cardiac modeling.   Medication Adjustments/Labs and Tests Ordered: Current medicines are reviewed at length with the patient today.  Concerns regarding medicines are outlined above.   Orders Placed This Encounter  Procedures   Basic metabolic panel   CBC   Pro b natriuretic peptide (BNP)   EKG 12-Lead   No orders of the defined types were placed in this encounter.  Patient Instructions  Medication Instructions:  Your physician recommends that you continue on your current medications as directed. Please refer to the Current Medication list given to you today.  *If you need a refill on your cardiac medications before your next appointment, please call your pharmacy*   Lab Work: CBC, BMP, BNP  If you have labs (blood work) drawn today and your tests are completely normal, you will receive your results only by: MyChart Message (if you have MyChart) OR A paper copy in the mail If you have any lab test that is abnormal or we need to change your treatment, we will call you to review the results.   Testing/Procedures: Your physician has requested that you have a cardiac catheterization. Cardiac catheterization is used to diagnose and/or treat various heart conditions. Doctors may recommend this procedure for a number of different reasons. The most common reason is to evaluate chest pain. Chest pain can be a symptom of coronary artery disease (CAD), and cardiac catheterization can show whether plaque is narrowing or blocking your heart's arteries. This procedure is also used to evaluate the valves, as well as measure the blood flow and oxygen levels in different parts of your heart. For further information please visit [email or web address] Please follow instruction sheet, as given.    Follow-Up:To be determined At [facility], you and your health needs are our priority.  As part of our continuing  mission to provide you with exceptional heart care, we have created designated Provider Care Teams.  These Care Teams include your primary Cardiologist (physician) and Advanced Practice Providers (APPs -  Physician Assistants and Nurse Practitioners) who all work together to provide you with the care you need, when you need it.   Provider:   Riley Lam, MD    [facility] Stumpf,acting as a scribe for Christell Constant, MD.,have documented all relevant documentation on the behalf of Christell Constant, MD,as directed by  Christell Constant, MD while in the presence of Christell Constant, MD.  I, Christell Constant, MD, have reviewed all documentation for this visit. The documentation on 03/13/23 for the exam, diagnosis, procedures, and orders are all accurate and complete.    Signed, Christell Constant, MD  03/13/2023 12:12 PM    [facility]

## 2023-03-14 ENCOUNTER — Other Ambulatory Visit: Payer: Self-pay | Admitting: Internal Medicine

## 2023-03-14 LAB — PRO B NATRIURETIC PEPTIDE: NT-Pro BNP: 1389 pg/mL — ABNORMAL HIGH (ref 0–301)

## 2023-03-14 LAB — BASIC METABOLIC PANEL
BUN/Creatinine Ratio: 24 (ref 12–28)
BUN: 22 mg/dL (ref 8–27)
CO2: 28 mmol/L (ref 20–29)
Calcium: 9.7 mg/dL (ref 8.7–10.3)
Chloride: 100 mmol/L (ref 96–106)
Creatinine, Ser: 0.93 mg/dL (ref 0.57–1.00)
Glucose: 79 mg/dL (ref 70–99)
Potassium: 4.2 mmol/L (ref 3.5–5.2)
Sodium: 140 mmol/L (ref 134–144)
eGFR: 65 mL/min/{1.73_m2} (ref >59)

## 2023-03-14 LAB — CBC
Hematocrit: 44.5 % (ref 34.0–46.6)
Hemoglobin: 13.7 g/dL (ref 11.1–15.9)
MCH: 28 pg (ref 26.6–33.0)
MCHC: 30.8 g/dL — ABNORMAL LOW (ref 31.5–35.7)
MCV: 91 fL (ref 79–97)
Platelets: 186 10*3/uL (ref 150–450)
RBC: 4.9 x10E6/uL (ref 3.77–5.28)
RDW: 15.7 % — ABNORMAL HIGH (ref 11.7–15.4)
WBC: 7 10*3/uL (ref 3.4–10.8)

## 2023-03-16 ENCOUNTER — Encounter: Payer: Medicare Other | Attending: Registered Nurse | Admitting: Registered Nurse

## 2023-03-16 ENCOUNTER — Encounter: Payer: Self-pay | Admitting: Registered Nurse

## 2023-03-16 VITALS — BP 123/89 | Ht 60.0 in | Wt 130.0 lb

## 2023-03-16 DIAGNOSIS — Z5181 Encounter for therapeutic drug level monitoring: Secondary | ICD-10-CM | POA: Insufficient documentation

## 2023-03-16 DIAGNOSIS — M25551 Pain in right hip: Secondary | ICD-10-CM | POA: Insufficient documentation

## 2023-03-16 DIAGNOSIS — Z79891 Long term (current) use of opiate analgesic: Secondary | ICD-10-CM | POA: Insufficient documentation

## 2023-03-16 DIAGNOSIS — M48062 Spinal stenosis, lumbar region with neurogenic claudication: Secondary | ICD-10-CM | POA: Diagnosis not present

## 2023-03-16 DIAGNOSIS — M25552 Pain in left hip: Secondary | ICD-10-CM | POA: Diagnosis not present

## 2023-03-16 DIAGNOSIS — G8929 Other chronic pain: Secondary | ICD-10-CM | POA: Insufficient documentation

## 2023-03-16 DIAGNOSIS — M24551 Contracture, right hip: Secondary | ICD-10-CM | POA: Insufficient documentation

## 2023-03-16 DIAGNOSIS — M25562 Pain in left knee: Secondary | ICD-10-CM | POA: Diagnosis not present

## 2023-03-16 DIAGNOSIS — G894 Chronic pain syndrome: Secondary | ICD-10-CM | POA: Diagnosis not present

## 2023-03-16 MED ORDER — HYDROCODONE-ACETAMINOPHEN 10-325 MG PO TABS: 1.0000 | ORAL_TABLET | Freq: Three times a day (TID) | ORAL | 0 refills | Status: DC | PRN

## 2023-03-16 NOTE — Progress Notes (Signed)
Subjective:    Patient ID: Hannah Scott, female    DOB: 20-Aug-1950, 72 y.o.   MRN: [id number]  HPI: Hannah Scott is a 72 y.o. female who returns for follow up appointment for chronic pain and medication refill. She states her pain is located in her lower back, bilateral hips and left knee pain. She  rates her pain 7. Her current exercise regime is walking and performing stretching exercises.  Hannah Scott reports history of Raynaud's, her Oxygen saturation varies from 88- 94%, she denies SOB at this time .    Hannah Scott Morphine equivalent is 30.00 MME.   Last Oral Swab was Performed on 01/03/2023, it was consistent.    Pain Inventory Average Pain 7 Pain Right Now 7 My pain is constant, burning, tingling, and aching  In the last 24 hours, has pain interfered with the following? General activity 10 Relation with others 10 Enjoyment of life 10 What TIME of day is your pain at its worst? varies Sleep (in general) Fair  Pain is worse with: walking and standing Pain improves with: rest and medication Relief from Meds: 4  Family History  Problem Relation Age of Onset   Heart failure Mother    Pneumonia Mother    Hypertension Mother    Heart disease Mother    Stroke Maternal Grandfather    Hypertension Maternal Grandfather    Heart attack Father    Heart disease Father    Asthma Paternal Uncle        PAT UNCLES   Heart attack Paternal Uncle    Social History   Socioeconomic History   Marital status: Widowed    Spouse name: Not on file   Number of children: Not on file   Years of education: Not on file   Highest education level: Not on file  Occupational History   Not on file  Tobacco Use   Smoking status: Never    Passive exposure: Never   Smokeless tobacco: Never  Vaping Use   Vaping status: Never Used  Substance and Sexual Activity   Alcohol use: No    Alcohol/week: 0.0 standard drinks of alcohol   Drug use: No   Sexual activity: Not Currently    Partners: Male     Birth control/protection: Surgical  Other Topics Concern   Not on file  Social History Narrative   Widowed in 2015   2 sons 1 lives in the area the other is in close contact    1 grandchild   Never smoker no drug use no alcohol   Social Determinants of Health   Financial Resource Strain: Low Risk  (01/11/2022)   Overall Financial Resource Strain (CARDIA)    Difficulty of Paying Living Expenses: Not hard at all  Food Insecurity: No Food Insecurity (01/11/2022)   Hunger Vital Sign    Worried About Running Out of Food in the Last Year: Never true    Ran Out of Food in the Last Year: Never true  Transportation Needs: No Transportation Needs (01/11/2022)   PRAPARE - Administrator, Civil Service (Medical): No    Lack of Transportation (Non-Medical): No  Physical Activity: Inactive (01/11/2022)   Exercise Vital Sign    Days of Exercise per Week: 0 days    Minutes of Exercise per Session: 0 min  Stress: No Stress Concern Present (01/11/2022)   [employer] of Occupational Health - Occupational Stress Questionnaire    Feeling of Stress : Not at  all  Social Connections: Socially Integrated (01/11/2022)   Social Connection and Isolation Panel [NHANES]    Frequency of Communication with Friends and Family: More than three times a week    Frequency of Social Gatherings with Friends and Family: More than three times a week    Attends Religious Services: More than 4 times per year    Active Member of Clubs or Organizations: Yes    Attends Engineer, structural: More than 4 times per year    Marital Status: Married   Past Surgical History:  Procedure Laterality Date   ANKLE SURGERY Left    ligament   APPENDECTOMY  1987   AT [facility]   BACK SURGERY     Fusion   BREAST LUMPECTOMY  2003   radiation on right   BREAST LUMPECTOMY WITH RADIOACTIVE SEED LOCALIZATION Left 02/12/2016   Procedure: LEFT BREAST LUMPECTOMY WITH RADIOACTIVE SEED LOCALIZATION;  Surgeon: Glenna Fellows, MD;  Location: [facility];  Service: General;  Laterality: Left;   CARDIOVASCULAR STRESS TEST  09/07/05   Nuclear, was negative   CATARACT EXTRACTION Bilateral 01,03   COLONOSCOPY WITH PROPOFOL N/A 05/04/2021   Procedure: COLONOSCOPY WITH PROPOFOL;  Surgeon: Iva Boop, MD;  Location: [facility];  Service: Endoscopy;  Laterality: N/A;   ESOPHAGOGASTRODUODENOSCOPY (EGD) WITH PROPOFOL N/A 05/04/2021   Procedure: ESOPHAGOGASTRODUODENOSCOPY (EGD) WITH PROPOFOL;  Surgeon: Iva Boop, MD;  Location: [facility];  Service: Endoscopy;  Laterality: N/A;   OOPHORECTOMY     LSO -RSO   PELVIC LAPAROSCOPY  1989   RSO, LYSIS OF ADHESIONS   POLYPECTOMY  05/04/2021   Procedure: POLYPECTOMY;  Surgeon: Iva Boop, MD;  Location: [facility];  Service: Endoscopy;;   TOTAL ABDOMINAL HYSTERECTOMY  1987   LSO, APPENDECTOMY   TOTAL HIP ARTHROPLASTY Right 10/28/2013   dr Ophelia Charter   TOTAL HIP ARTHROPLASTY Right 10/28/2013   Procedure: TOTAL HIP ARTHROPLASTY ANTERIOR APPROACH;  Surgeon: Eldred Manges, MD;  Location: [facility];  Service: Orthopedics;  Laterality: Right;  Right Total Hip Arthroplasty, Direct Anterior Approach   Past Surgical History:  Procedure Laterality Date   ANKLE SURGERY Left    ligament   APPENDECTOMY  1987   AT [facility]   BACK SURGERY     Fusion   BREAST LUMPECTOMY  2003   radiation on right   BREAST LUMPECTOMY WITH RADIOACTIVE SEED LOCALIZATION Left 02/12/2016   Procedure: LEFT BREAST LUMPECTOMY WITH RADIOACTIVE SEED LOCALIZATION;  Surgeon: Glenna Fellows, MD;  Location: [facility];  Service: General;  Laterality: Left;   CARDIOVASCULAR STRESS TEST  09/07/05   Nuclear, was negative   CATARACT EXTRACTION Bilateral 01,03   COLONOSCOPY WITH PROPOFOL N/A 05/04/2021   Procedure: COLONOSCOPY WITH PROPOFOL;  Surgeon: Iva Boop, MD;  Location: [facility];  Service: Endoscopy;  Laterality: N/A;   ESOPHAGOGASTRODUODENOSCOPY (EGD) WITH PROPOFOL  N/A 05/04/2021   Procedure: ESOPHAGOGASTRODUODENOSCOPY (EGD) WITH PROPOFOL;  Surgeon: Iva Boop, MD;  Location: [facility];  Service: Endoscopy;  Laterality: N/A;   OOPHORECTOMY     LSO -RSO   PELVIC LAPAROSCOPY  1989   RSO, LYSIS OF ADHESIONS   POLYPECTOMY  05/04/2021   Procedure: POLYPECTOMY;  Surgeon: Iva Boop, MD;  Location: [facility];  Service: Endoscopy;;   TOTAL ABDOMINAL HYSTERECTOMY  1987   LSO, APPENDECTOMY   TOTAL HIP ARTHROPLASTY Right 10/28/2013   dr Ophelia Charter   TOTAL HIP ARTHROPLASTY Right 10/28/2013   Procedure: TOTAL HIP ARTHROPLASTY ANTERIOR APPROACH;  Surgeon: Eldred Manges, MD;  Location: [facility];  Service: Orthopedics;  Laterality: Right;  Right Total Hip Arthroplasty, Direct Anterior Approach   Past Medical History:  Diagnosis Date   Anemia    hx   Arthritis    Asthma    PFTs, February, 2011, moderate obstructive disease with response to bronchodilators, normal lung volumes, moderate reduction in diffusing capacity   Atrial septal aneurysm    Echo, 2008-not noted on 13 echo   CAD (coronary artery disease)    90% distal LAD in the past  /   nuclear, 2008, no ischemia, ejection fraction 70%   Cancer (HCC) 2002   DUCTAL CIS--S/P LUMPECTOMY, RADIATION AND 6 WEEKS OF TAMOXIFEN   D-dimer, elevated    January, 2014   Depression    Ejection fraction    EF 60%, echo, October, 2008   Elevated CPK    January, 2014   Endometriosis 1989   RIGHT TUBE   Endometriosis 1987   LEFT TUBE/OVARY W FOCAL IN-SITU ENDOMETRIAL ADENOCARCINOMA   GERD (gastroesophageal reflux disease)    occ   H/O hiatal hernia    ?   Hyperlipidemia    Hypertension    Hypothyroidism    Patient has had in the past that she does not need treatment   Kyphoscoliosis    Obstructive airway disease (HCC)    Pinched nerve    lower back   Shortness of breath    Echo 2/22: EF 60-65, no RWMA, mild LVH, GR 1  DD, GLS -24%, normal RVSF, mild MR, trivial AI, borderline dilation of aortic root (39  mm)   UTI (lower urinary tract infection)    BP 123/89   Ht 5' (1.524 m)   Wt 130 lb (59 kg)   BMI 25.39 kg/m   Opioid Risk Score:   Fall Risk Score:  `1  Depression screen PHQ 2/9     03/16/2023    2:16 PM 01/03/2023    2:31 PM 11/22/2022    3:06 PM 10/21/2022    2:10 PM 09/13/2022    2:51 PM 08/15/2022   11:52 AM 07/25/2022    8:44 AM  Depression screen PHQ 2/9  Decreased Interest 0 1 0 1 1 1  0  Down, Depressed, Hopeless 0 1 0 1 1 1  0  PHQ - 2 Score 0 2 0 2 2 2  0  Altered sleeping       2  Tired, decreased energy       3  Change in appetite       0  Feeling bad or failure about yourself        0  Trouble concentrating       0  Moving slowly or fidgety/restless       0  Suicidal thoughts       0  PHQ-9 Score       5  Difficult doing work/chores       Somewhat difficult      Review of Systems  Musculoskeletal:  Positive for back pain and gait problem.  All other systems reviewed and are negative.     Objective:   Physical Exam Vitals and nursing note reviewed.  Constitutional:      Appearance: Normal appearance.  Cardiovascular:     Rate and Rhythm: Normal rate and regular rhythm.     Pulses: Normal pulses.     Heart sounds: Normal heart sounds.  Pulmonary:     Effort: Pulmonary effort is normal.  Breath sounds: Normal breath sounds.  Musculoskeletal:     Comments: Normal Muscle Bulk and Muscle Testing Reveals:  Upper Extremities: Full ROM and Muscle Strength 5/5 Lumbar Paraspinal Tenderness: L-3-L-5 Right Greater Trochanter Tenderness Lower Extremities: Right: Full ROM and Muscle Strength 5/5 Left Lower Extremity: Decreased ROM and Muscle Strength 5/5 Left Lower Extremity Flexion Produces Pain into her Left Patella Arises from Table slowly using walker for support Antalgic  Gait     Skin:    General: Skin is warm and dry.  Neurological:     Mental Status: She is alert and oriented to person, place, and time.  Psychiatric:        Mood and Affect:  Mood normal.        Behavior: Behavior normal.         Assessment & Plan:  1. Lumbar post laminectomy syndrome with severe kyphoscoliosis thoracolumbar spine, s/p lumbar fusion/  03/16/2023 Continue current medication regimen. Continue  Hydrocodone 10/325mg  one tablet every 8 hours #90. We will continue the opioid monitoring program, this consists of regular clinic visits, examinations, urine drug screen, pill counts as well as use of [place] Controlled Substance Reporting system. A 12 month History has been reviewed on the [place] Controlled Substance Reporting System on 03/16/2023 2. Right Hip OA: S/P Right Hip Replacement 10/28/2013. 03/16/2023 3.Depression: Continue Cymbalta. Has Family Support and Friends.  Counseling with Renato Gails. 03/16/2023 4. Bilateral Hip Pain/ Right Greater Trochanteric Tenderness: Continue to Monitor. Continue with Ice and Heat Therapy. 03/16/2023 5. Poor Appetite/ Frail: No complaints today. Ms. Mcnulty states PCP following. Continue to monitor. 03/16/2023 6. Chronic Left  Knee Pain: Continue HEP as Tolerated. Continue to Monitor. 03/16/2023 7. Chronic Right  shoulder pain:  No complaints today.Continue HEP as tolerated. Continue to Monitor. 03/16/2023   F/U in 1 month

## 2023-03-20 ENCOUNTER — Encounter: Payer: Self-pay | Admitting: Adult Health

## 2023-03-20 ENCOUNTER — Ambulatory Visit: Payer: Medicare Other | Admitting: Adult Health

## 2023-03-20 VITALS — BP 118/76 | HR 80 | Temp 97.9°F | Ht 60.0 in | Wt 129.6 lb

## 2023-03-20 DIAGNOSIS — J4540 Moderate persistent asthma, uncomplicated: Secondary | ICD-10-CM

## 2023-03-20 DIAGNOSIS — R0683 Snoring: Secondary | ICD-10-CM

## 2023-03-20 MED ORDER — IPRATROPIUM-ALBUTEROL 0.5-2.5 (3) MG/3ML IN SOLN: 3.0000 mL | Freq: Four times a day (QID) | RESPIRATORY_TRACT | 3 refills | Status: AC | PRN

## 2023-03-20 NOTE — Patient Instructions (Addendum)
Follow up with ENT as discussed  Continue on BREO 1 puff daily, rinse after use.  Albuterol inhaler or Duoneb As needed   Continue on Oxygen 2l/m with activity and At bedtime  -goal is to keep O2 sats >88-90% Call back if you change your mind on sleep study.  Follow up with Cardiology as planned next month  Follow up with Dr. Delton Coombes  in 3 months with PFT and As needed

## 2023-03-20 NOTE — Progress Notes (Signed)
@Patient  ID: Hannah Scott, female    DOB: 05-Jul-1950, 72 y.o.   MRN: [id number]  Chief Complaint  Patient presents with   Follow-up   Discussed the use of AI scribe software for clinical note transcription with the patient, who gave verbal consent to proceed.   Referring provider: Pincus Sanes, MD  HPI: 72 year old female never smoker followed for asthma Medical history significant for breast cancer status postlumpectomy and tamoxifen ENT evaluation with paralyzed VC  Medical history significant for coronary artery disease, atrial septum aneurysm, congestive heart failure , pulmonary hypertension followed by cardiology, severe scoliosis  TEST/EVENTS :  CT chest Sep 10, 2021 clear lungs except for subsegmental atelectasis in the left lower lobe.  Elevation of bilateral hemidiaphragms left greater than right  2D echo April 14, 2022 EF 6065%, moderate LVH, grade 1 diastolic dysfunction moderately elevated pulmonary artery systolic pressure  Spirometry 4098 showed severe airflow obstruction FEV1 39%, ratio 56  03/20/2023 Follow up : Asthma  Presents for 23-month follow-up.  Patient has lifelong asthma.  She is treated for moderate persistent asthma.  She is maintained on Breo daily.  Says overall asthma feels like it is under good control.  She has no flare of cough or wheezing.  No increased albuterol use. Her main complaint is she has shortness of breath with decreased activity tolerance.  Gets winded with activities.  Patient is followed for coronary artery disease by cardiology.  Echo in December 2023 showed preserved EF.  Grade 1 diastolic dysfunction.  Mildly elevated pulmonary artery systolic pressures.  She has an upcoming left and right heart cath next month with cardiology.  She reports that her asthma has been well-controlled with Breo, and flare-ups typically occur post-cold and manifest as coughing, wheezing, and difficulty breathing. However, she notes that her current  shortness of breath does not present with wheezing or coughing, leading to uncertainty about its origin.  The patient also reports a history of severe scoliosis, which has progressively worsened over the years. She also has a paralyzed vocal cord, which occurred spontaneously about two years ago presented with hoarseness.  She has been seen by ENT with previous direct laryngoscopy.  Patient was admitted with critical illness May 2023 with sepsis, pneumonia and urinary tract infection.  She was discharged on oxygen at that time.  She remains on oxygen 2 L during the daytime as needed and at bedtime.  She does feel like oxygen helps her at nighttime to breathe better.  She does have some reported snoring, restless sleep and daytime sleepiness. She reports that the oxygen therapy has improved her sleep quality, as she previously experienced a smothering sensation when lying down.  She also reports symptoms of restless legs, particularly at night, which may be contributing to her sleep disturbances.       Allergies  Allergen Reactions   Fluticasone-Salmeterol Other (See Comments)    REACTION: headache.   She is tolerating Dulera well.  Other reaction(s): Other (See Comments)  Other Reaction(s): Other (See Comments)  REACTION: headache. , She is tolerating Dulera well.   Norvasc [Amlodipine Besylate] Swelling and Other (See Comments)    edema   Tape Other (See Comments)    Prefers paper tape Other reaction(s): Other (See Comments) Prefers paper tape- "everything else rips my skin"   Heparin    Morphine Nausea Only    Immunization History  Administered Date(s) Administered   Fluad Quad(high Dose 65+) 01/04/2019, 01/20/2022   Fluad Trivalent(High Dose 65+) 02/07/2023  Influenza Split 03/23/2011, 02/15/2012   Influenza Whole 02/12/2003, 12/24/2009   Influenza, High Dose Seasonal PF 01/06/2016, 01/10/2017, 03/16/2018   Influenza,inj,Quad PF,6+ Mos 01/29/2013, 01/03/2014, 02/11/2015    Influenza-Unspecified 04/09/2021   Moderna Covid-19 Vaccine Bivalent Booster 47yrs & up 03/26/2021   Moderna SARS-COV2 Booster Vaccination 04/10/2020   Moderna Sars-Covid-2 Vaccination 06/21/2019, 07/24/2019   Pneumococcal Conjugate-13 05/06/2015   Pneumococcal Polysaccharide-23 07/06/2016   Tdap 01/09/2019    Past Medical History:  Diagnosis Date   Anemia    hx   Arthritis    Asthma    PFTs, February, 2011, moderate obstructive disease with response to bronchodilators, normal lung volumes, moderate reduction in diffusing capacity   Atrial septal aneurysm    Echo, 2008-not noted on 13 echo   CAD (coronary artery disease)    90% distal LAD in the past  /   nuclear, 2008, no ischemia, ejection fraction 70%   Cancer (HCC) 2002   DUCTAL CIS--S/P LUMPECTOMY, RADIATION AND 6 WEEKS OF TAMOXIFEN   D-dimer, elevated    January, 2014   Depression    Ejection fraction    EF 60%, echo, October, 2008   Elevated CPK    January, 2014   Endometriosis 1989   RIGHT TUBE   Endometriosis 1987   LEFT TUBE/OVARY W FOCAL IN-SITU ENDOMETRIAL ADENOCARCINOMA   GERD (gastroesophageal reflux disease)    occ   H/O hiatal hernia    ?   Hyperlipidemia    Hypertension    Hypothyroidism    Patient has had in the past that she does not need treatment   Kyphoscoliosis    Obstructive airway disease (HCC)    Pinched nerve    lower back   Shortness of breath    Echo 2/22: EF 60-65, no RWMA, mild LVH, GR 1  DD, GLS -24%, normal RVSF, mild MR, trivial AI, borderline dilation of aortic root (39 mm)   UTI (lower urinary tract infection)     Tobacco History: Social History   Tobacco Use  Smoking Status Never   Passive exposure: Never  Smokeless Tobacco Never   Counseling given: Not Answered   Outpatient Medications Prior to Visit  Medication Sig Dispense Refill   albuterol (VENTOLIN HFA) 108 (90 Base) MCG/ACT inhaler Inhale 2 puffs into the lungs every 4 (four) hours as needed for wheezing or  shortness of breath. 1 each 11   aspirin EC 81 MG tablet Take 1 tablet (81 mg total) by mouth daily. Swallow whole. 90 tablet 3   atorvastatin (LIPITOR) 40 MG tablet Take 1 tablet (40 mg total) by mouth daily. 90 tablet 0   bisoprolol (ZEBETA) 5 MG tablet TAKE 1 TABLET BY MOUTH DAILY 90 tablet 1   clobetasol cream (TEMOVATE) 0.05 % APPLY TO THE AFFECTED AREA TWICE DAILY 30 g 2   DULoxetine (CYMBALTA) 30 MG capsule TAKE ONE CAPSULE EVERY DAY IN ADDITION TO 60MG  CAPSULE FOR A TOTAL OF 90MG  DAILY 90 capsule 1   DULoxetine (CYMBALTA) 60 MG capsule TAKE ONE CAPSULE BY MOUTH EVERY DAY TAKE ALONG WITH 30MG  CAPSULE. TOTAL DAILY DOSE is 90MG . 90 capsule 1   famotidine (PEPCID) 20 MG tablet TAKE ONE TABLET EVERY DAY AFTER SUPPER 90 tablet 2   fluticasone furoate-vilanterol (BREO ELLIPTA) 100-25 MCG/ACT AEPB INHALE 1 PUFF INTO THE LUNGS DAILY 60 each 11   furosemide (LASIX) 20 MG tablet Take 1 tablet (20 mg total) by mouth daily. 90 tablet 3   gabapentin (NEURONTIN) 600 MG tablet TAKE ONE TABLET  BY MOUTH EVERY DAY AT BEDTIME 90 tablet 1   HYDROcodone-acetaminophen (NORCO) 10-325 MG tablet Take 1 tablet by mouth 3 (three) times daily as needed. 90 tablet 0   ipratropium-albuterol (DUONEB) 0.5-2.5 (3) MG/3ML SOLN Take 3 mLs by nebulization every 4 (four) hours as needed (wheezing or SOB). During exacerbation 360 mL 1   irbesartan (AVAPRO) 75 MG tablet TAKE 1 TABLET(75 MG) BY MOUTH DAILY 90 tablet 1   levothyroxine (SYNTHROID) 100 MCG tablet TAKE 1 TABLET(100 MCG) BY MOUTH DAILY 90 tablet 3   zaleplon (SONATA) 5 MG capsule Take 1 capsule (5 mg total) by mouth at bedtime as needed for sleep. 30 capsule 0   empagliflozin (JARDIANCE) 10 MG TABS tablet Take 1 tablet (10 mg total) by mouth daily before breakfast. (Patient not taking: Reported on 03/20/2023) 30 tablet 5   No facility-administered medications prior to visit.     Review of Systems:   Constitutional:   No  weight loss, night sweats,  Fevers,  chills, +fatigue, or  lassitude.  HEENT:   No headaches,  Difficulty swallowing,  Tooth/dental problems, or  Sore throat,                No sneezing, itching, ear ache, nasal congestion, post nasal drip,   CV:  No chest pain,  Orthopnea, PND, swelling in lower extremities, anasarca, dizziness, palpitations, syncope.   GI  No heartburn, indigestion, abdominal pain, nausea, vomiting, diarrhea, change in bowel habits, loss of appetite, bloody stools.   Resp:   No chest wall deformity  Skin: no rash or lesions.  GU: no dysuria, change in color of urine, no urgency or frequency.  No flank pain, no hematuria   MS:  No joint pain or swelling.  No decreased range of motion.  No back pain.    Physical Exam  BP 118/76 (BP Location: Left Arm, Patient Position: Sitting, Cuff Size: Normal)   Pulse 80   Temp 97.9 F (36.6 C) (Oral)   Ht 5' (1.524 m)   Wt 129 lb 9.6 oz (58.8 kg)   SpO2 99%   BMI 25.31 kg/m   GEN: A/Ox3; pleasant , NAD, elderly , rolling walker   HEENT:  [place]/AT,   NOSE-clear, THROAT-clear, no lesions, no postnasal drip or exudate noted.   NECK:  Supple w/ fair ROM; no JVD; normal carotid impulses w/o bruits; no thyromegaly or nodules palpated; no lymphadenopathy.    RESP  Clear  P & A; w/o, wheezes/ rales/ or rhonchi. no accessory muscle use, no dullness to percussion Severe scoliosis  CARD:  RRR, no m/r/g, no peripheral edema, pulses intact, no cyanosis or clubbing.  GI:   Soft & nt; nml bowel sounds; no organomegaly or masses detected.   Musco: Warm bil, no  joint swelling noted.,  Severe scoliosis  Neuro: alert, no focal deficits noted.    Skin: Warm, no lesions or rashes    Lab Results:  CBC  BMET    Imaging: No results found.  Administration History     None           No data to display          No results found for: "NITRICOXIDE"      Assessment & Plan:  Assessment and Plan    Asthma   Her lifelong asthma appears  well-controlled with Breo,-with no typical symptoms of flare as she has had in the past with cough and wheezing.  For now we will continue on Breo  daily.  Asthma action plan discussed.  Albuterol or DuoNeb as needed.  Check PFTs on return visit.-In the past has had difficulty completing PFTs.  Previous chest x-ray August 2024 showed some bronchitic changes.  Going forward could consider high-resolution CT chest to further evaluate if indicated..    Shortness of Breath   She experiences intermittent shortness of breath, questionable etiology.  Suspect is multifactorial.  Has underlying asthma, severe scoliosis with suspected restrictive lung disease, pulmonary hypertension, coronary artery disease, congestive heart failure and deconditioning.  Patient is currently being evaluated by cardiology with an upcoming right and left heart cath.  Congestive heart failure maintenance regimen is being maximized.  Does not appear to be volume overloaded on exam.   Will check PFTs on return.   Severe Scoliosis   Her severe scoliosis, with suspected restrictive lung disease.  Continue follow-up with primary care .   Suspected Sleep Apnea   She reports snoring and poor sleep quality, restless leg syndrome with oxygen therapy at night improving symptoms. Potential sleep apnea could be contributing to pulmonary hypertension and restless legs. We discussed the possibility of a sleep study and the use of CPAP/BiPAP if sleep apnea is confirmed, though she prefers to wait until after the cardiac evaluation.  Paralyzed Vocal Cord   Her left vocal cord paralysis, diagnosed in April 2023, with associated hoarseness.  . We discussed the need for follow-up with ENT as previously recommended  General Health Maintenance   She is up-to-date on the flu vaccine   Chronic respiratory failure continue on oxygen 2 L with activity and bedtime  Follow-up   We plan to follow up with Doctor Byrum in three months, schedule a  follow-up with ENT after the first of the year, and follow up with cardiology as planned.        I spent  46  minutes dedicated to the care of this patient on the date of this encounter to include pre-visit review of records, face-to-face time with the patient discussing conditions above, post visit ordering of testing, clinical documentation with the electronic health record, making appropriate referrals as documented, and communicating necessary findings to members of the patients care team.    Rubye Oaks, NP 03/20/2023

## 2023-03-24 DIAGNOSIS — J4540 Moderate persistent asthma, uncomplicated: Secondary | ICD-10-CM | POA: Diagnosis not present

## 2023-03-24 DIAGNOSIS — R0609 Other forms of dyspnea: Secondary | ICD-10-CM | POA: Diagnosis not present

## 2023-03-27 ENCOUNTER — Telehealth: Payer: Self-pay | Admitting: *Deleted

## 2023-03-27 NOTE — Telephone Encounter (Addendum)
Cardiac Catheterization scheduled at [facility] for: Tuesday March 28, 2023 9 AM Arrival time [facility] Main Entrance A at: 7 AM  Nothing to eat after midnight prior to procedure, clear liquids until 5 AM day of procedure.  Medication instructions: -Hold:  Lasix-AM of procedure  Patient reports she is not currently taking Jardiance.   -Other usual morning medications can be taken with sips of water including aspirin 81 mg.  Plan to go home the same day, you will only stay overnight if medically necessary.  You must have responsible adult to drive you home.  Someone must be with you the first 24 hours after you arrive home.

## 2023-03-28 ENCOUNTER — Other Ambulatory Visit: Payer: Self-pay

## 2023-03-28 ENCOUNTER — Ambulatory Visit (HOSPITAL_COMMUNITY)
Admission: RE | Admit: 2023-03-28 | Discharge: 2023-03-28 | Disposition: A | Discharge status: Home / Self Care | Payer: Medicare Other | Attending: Cardiology | Admitting: Cardiology

## 2023-03-28 ENCOUNTER — Encounter (HOSPITAL_COMMUNITY)
Admission: RE | Disposition: A | Discharge status: Home / Self Care | Payer: Self-pay | Source: Home / Self Care | Attending: Cardiology

## 2023-03-28 ENCOUNTER — Encounter (HOSPITAL_COMMUNITY): Payer: Self-pay | Admitting: Cardiology

## 2023-03-28 DIAGNOSIS — I2510 Atherosclerotic heart disease of native coronary artery without angina pectoris: Secondary | ICD-10-CM | POA: Diagnosis not present

## 2023-03-28 DIAGNOSIS — I2721 Secondary pulmonary arterial hypertension: Secondary | ICD-10-CM | POA: Diagnosis not present

## 2023-03-28 DIAGNOSIS — I25111 Atherosclerotic heart disease of native coronary artery with angina pectoris with documented spasm: Secondary | ICD-10-CM | POA: Diagnosis not present

## 2023-03-28 DIAGNOSIS — I272 Pulmonary hypertension, unspecified: Secondary | ICD-10-CM

## 2023-03-28 DIAGNOSIS — I252 Old myocardial infarction: Secondary | ICD-10-CM | POA: Insufficient documentation

## 2023-03-28 DIAGNOSIS — R079 Chest pain, unspecified: Secondary | ICD-10-CM | POA: Diagnosis not present

## 2023-03-28 DIAGNOSIS — J9610 Chronic respiratory failure, unspecified whether with hypoxia or hypercapnia: Secondary | ICD-10-CM | POA: Diagnosis not present

## 2023-03-28 DIAGNOSIS — J45909 Unspecified asthma, uncomplicated: Secondary | ICD-10-CM | POA: Insufficient documentation

## 2023-03-28 DIAGNOSIS — E782 Mixed hyperlipidemia: Secondary | ICD-10-CM | POA: Insufficient documentation

## 2023-03-28 DIAGNOSIS — I5033 Acute on chronic diastolic (congestive) heart failure: Secondary | ICD-10-CM | POA: Insufficient documentation

## 2023-03-28 DIAGNOSIS — Z79899 Other long term (current) drug therapy: Secondary | ICD-10-CM | POA: Insufficient documentation

## 2023-03-28 DIAGNOSIS — I110 Hypertensive heart disease with heart failure: Secondary | ICD-10-CM | POA: Diagnosis not present

## 2023-03-28 HISTORY — PX: RIGHT/LEFT HEART CATH AND CORONARY ANGIOGRAPHY: CATH118266

## 2023-03-28 LAB — POCT I-STAT EG7
Acid-Base Excess: 3 mmol/L — ABNORMAL HIGH (ref 0.0–2.0)
Acid-Base Excess: 4 mmol/L — ABNORMAL HIGH (ref 0.0–2.0)
Bicarbonate: 30.6 mmol/L — ABNORMAL HIGH (ref 20.0–28.0)
Bicarbonate: 31.6 mmol/L — ABNORMAL HIGH (ref 20.0–28.0)
Calcium, Ion: 1.2 mmol/L (ref 1.15–1.40)
Calcium, Ion: 1.24 mmol/L (ref 1.15–1.40)
HCT: 42 % (ref 36.0–46.0)
HCT: 43 % (ref 36.0–46.0)
Hemoglobin: 14.3 g/dL (ref 12.0–15.0)
Hemoglobin: 14.6 g/dL (ref 12.0–15.0)
O2 Saturation: 63 %
O2 Saturation: 65 %
Potassium: 3.8 mmol/L (ref 3.5–5.1)
Potassium: 3.9 mmol/L (ref 3.5–5.1)
Sodium: 139 mmol/L (ref 135–145)
Sodium: 140 mmol/L (ref 135–145)
TCO2: 32 mmol/L (ref 22–32)
TCO2: 33 mmol/L — ABNORMAL HIGH (ref 22–32)
pCO2, Ven: 56.1 mm[Hg] (ref 44–60)
pCO2, Ven: 56.9 mm[Hg] (ref 44–60)
pH, Ven: 7.345 (ref 7.25–7.43)
pH, Ven: 7.353 (ref 7.25–7.43)
pO2, Ven: 35 mm[Hg] (ref 32–45)
pO2, Ven: 36 mm[Hg] (ref 32–45)

## 2023-03-28 LAB — POCT I-STAT 7, (LYTES, BLD GAS, ICA,H+H)
Acid-Base Excess: 4 mmol/L — ABNORMAL HIGH (ref 0.0–2.0)
Bicarbonate: 30.8 mmol/L — ABNORMAL HIGH (ref 20.0–28.0)
Calcium, Ion: 1.25 mmol/L (ref 1.15–1.40)
HCT: 42 % (ref 36.0–46.0)
Hemoglobin: 14.3 g/dL (ref 12.0–15.0)
O2 Saturation: 98 %
Potassium: 3.8 mmol/L (ref 3.5–5.1)
Sodium: 139 mmol/L (ref 135–145)
TCO2: 32 mmol/L (ref 22–32)
pCO2 arterial: 54 mm[Hg] — ABNORMAL HIGH (ref 32–48)
pH, Arterial: 7.365 (ref 7.35–7.45)
pO2, Arterial: 103 mm[Hg] (ref 83–108)

## 2023-03-28 SURGERY — RIGHT/LEFT HEART CATH AND CORONARY ANGIOGRAPHY
Anesthesia: LOCAL

## 2023-03-28 MED ORDER — LABETALOL HCL 5 MG/ML IV SOLN: 10.0000 mg | INTRAVENOUS | Status: DC | PRN

## 2023-03-28 MED ORDER — FENTANYL CITRATE (PF) 100 MCG/2ML IJ SOLN
INTRAMUSCULAR | Status: AC
  Filled 2023-03-28: qty 2

## 2023-03-28 MED ORDER — HEPARIN (PORCINE) IN NACL 1000-0.9 UT/500ML-% IV SOLN
INTRAVENOUS | Status: DC | PRN
  Administered 2023-03-28 (×2): 500 mL

## 2023-03-28 MED ORDER — HEPARIN SODIUM (PORCINE) 1000 UNIT/ML IJ SOLN
INTRAMUSCULAR | Status: AC
  Filled 2023-03-28: qty 10

## 2023-03-28 MED ORDER — LIDOCAINE HCL (PF) 1 % IJ SOLN
INTRAMUSCULAR | Status: AC
  Filled 2023-03-28: qty 30

## 2023-03-28 MED ORDER — LIDOCAINE HCL (PF) 1 % IJ SOLN
INTRAMUSCULAR | Status: DC | PRN
  Administered 2023-03-28: 2 mL
  Administered 2023-03-28: 1 mL

## 2023-03-28 MED ORDER — FENTANYL CITRATE (PF) 100 MCG/2ML IJ SOLN
INTRAMUSCULAR | Status: DC | PRN
  Administered 2023-03-28: 25 ug via INTRAVENOUS

## 2023-03-28 MED ORDER — VERAPAMIL HCL 2.5 MG/ML IV SOLN
INTRAVENOUS | Status: DC | PRN
  Administered 2023-03-28: 10 mL via INTRA_ARTERIAL

## 2023-03-28 MED ORDER — MIDAZOLAM HCL 2 MG/2ML IJ SOLN
INTRAMUSCULAR | Status: AC
  Filled 2023-03-28: qty 2

## 2023-03-28 MED ORDER — SODIUM CHLORIDE 0.9% FLUSH: 3.0000 mL | INTRAVENOUS | Status: DC | PRN

## 2023-03-28 MED ORDER — ASPIRIN 81 MG PO CHEW
81.0000 mg | CHEWABLE_TABLET | ORAL | Status: AC
Start: 2023-03-28 — End: 2023-03-28
  Administered 2023-03-28: 81 mg via ORAL

## 2023-03-28 MED ORDER — SODIUM CHLORIDE 0.9% FLUSH: 3.0000 mL | Freq: Two times a day (BID) | INTRAVENOUS | Status: DC

## 2023-03-28 MED ORDER — SODIUM CHLORIDE 0.9 % IV SOLN: 250.0000 mL | INTRAVENOUS | Status: DC | PRN

## 2023-03-28 MED ORDER — HYDRALAZINE HCL 20 MG/ML IJ SOLN: 10.0000 mg | INTRAMUSCULAR | Status: DC | PRN

## 2023-03-28 MED ORDER — HEPARIN SODIUM (PORCINE) 1000 UNIT/ML IJ SOLN
INTRAMUSCULAR | Status: DC | PRN
  Administered 2023-03-28: 3000 [IU] via INTRAVENOUS

## 2023-03-28 MED ORDER — VERAPAMIL HCL 2.5 MG/ML IV SOLN
INTRAVENOUS | Status: AC
  Filled 2023-03-28: qty 2

## 2023-03-28 MED ORDER — MIDAZOLAM HCL 2 MG/2ML IJ SOLN
INTRAMUSCULAR | Status: DC | PRN
  Administered 2023-03-28: .5 mg via INTRAVENOUS

## 2023-03-28 MED ORDER — ONDANSETRON HCL 4 MG/2ML IJ SOLN: 4.0000 mg | Freq: Four times a day (QID) | INTRAMUSCULAR | Status: DC | PRN

## 2023-03-28 MED ORDER — IOHEXOL 350 MG/ML SOLN
INTRAVENOUS | Status: DC | PRN
  Administered 2023-03-28: 37 mL

## 2023-03-28 MED ORDER — SODIUM CHLORIDE 0.9% FLUSH
10.0000 mL | Freq: Two times a day (BID) | INTRAVENOUS | Status: DC
  Administered 2023-03-28: 10 mL via INTRAVENOUS

## 2023-03-28 MED ORDER — ACETAMINOPHEN 325 MG PO TABS
650.0000 mg | ORAL_TABLET | ORAL | Status: DC | PRN
Start: 2023-03-28 — End: 2023-03-28

## 2023-03-28 SURGICAL SUPPLY — 12 items
CATH 5FR JL3.5 JR4 ANG PIG MP (CATHETERS) IMPLANT
CATH BALLN WEDGE 5F 110CM (CATHETERS) IMPLANT
DEVICE RAD TR BAND REGULAR (VASCULAR PRODUCTS) IMPLANT
GLIDESHEATH SLEND SS 6F .021 (SHEATH) IMPLANT
GUIDEWIRE .025 260CM (WIRE) IMPLANT
GUIDEWIRE INQWIRE 1.5J.035X260 (WIRE) IMPLANT
INQWIRE 1.5J .035X260CM (WIRE) ×1
PACK CARDIAC CATHETERIZATION (CUSTOM PROCEDURE TRAY) ×1 IMPLANT
SET ATX-X65L (MISCELLANEOUS) IMPLANT
SHEATH GLIDE SLENDER 4/5FR (SHEATH) IMPLANT
WIRE EMERALD 3MM-J .025X260CM (WIRE) IMPLANT
WIRE HI TORQ VERSACORE-J 145CM (WIRE) IMPLANT

## 2023-03-28 NOTE — Interval H&P Note (Signed)
History and Physical Interval Note:  03/28/2023 9:14 AM  Hannah Scott  has presented today for surgery, with the diagnosis of hp.  The various methods of treatment have been discussed with the patient and family. After consideration of risks, benefits and other options for treatment, the patient has consented to  Procedure(s): RIGHT/LEFT HEART CATH AND CORONARY ANGIOGRAPHY (N/A) as a surgical intervention.  The patient's history has been reviewed, patient examined, no change in status, stable for surgery.  I have reviewed the patient's chart and labs.  Questions were answered to the patient's satisfaction.     Xayden Linsey [employer]

## 2023-03-28 NOTE — Discharge Instructions (Signed)
Radial Site Care The following information offers guidance on how to care for yourself after your procedure. Your health care provider may also give you more specific instructions. If you have problems or questions, contact your health care provider. What can I expect after the procedure? After the procedure, it is common to have bruising and tenderness in the incision area. Follow these instructions at home: Incision site care  Follow instructions from your health care provider about how to take care of your incision site. Make sure you: Wash your hands with soap and water for at least 20 seconds before and after you change your bandage (dressing). If soap and water are not available, use hand sanitizer. Remove your dressing in 24 hours. Leave stitches (sutures), skin glue, or adhesive strips in place. These skin closures may need to stay in place for 2 weeks or longer. If adhesive strip edges start to loosen and curl up, you may trim the loose edges. Do not remove adhesive strips completely unless your health care provider tells you to do that. Do not take baths, swim, or use a hot tub for at least 1 week. You may shower 24 hours after the procedure or as told by your health care provider. Remove the dressing and gently wash the incision area with plain soap and water. Pat the area dry with a clean towel. Do not rub the site. That could cause bleeding. Do not apply powder or lotion to the site. Check your incision site every day for signs of infection. Check for: Redness, swelling, or pain. Fluid or blood. Warmth. Pus or a bad smell. Activity For 24 hours after the procedure, or as directed by your health care provider: Do not flex or bend the affected arm. Do not push or pull heavy objects with the affected arm. Do not operate machinery or power tools. Do not drive. You should not drive yourself home from the hospital or clinic if you go home during that time period. You may drive 24  hours after the procedure unless your health care provider tells you not to. Do not lift anything that is heavier than 10 lb (4.5 kg), or the limit that you are told, until your health care provider says that it is safe. Return to your normal activities as told by your health care provider. Ask your health care provider what activities are safe for you and when you can return to work. If you were given a sedative during the procedure, it can affect you for several hours. Do not drive or operate machinery until your health care provider says that it is safe. General instructions Take over-the-counter and prescription medicines only as told by your health care provider. If you will be going home right after the procedure, plan to have a responsible adult care for you for the time you are told. This is important. Keep all follow-up visits. This is important. Contact a health care provider if: You have a fever or chills. You have any of these signs of infection at your incision site: Redness, swelling, or pain. Fluid or blood. Warmth. Pus or a bad smell. Get help right away if: The incision area swells very fast. The incision area is bleeding, and the bleeding does not stop when you hold steady pressure on the area. Your arm or hand becomes pale, cool, tingly, or numb. These symptoms may represent a serious problem that is an emergency. Do not wait to see if the symptoms will go away. Get medical  help right away. Call your local emergency services (911 in the [place] Do not drive yourself to the hospital. Summary After the procedure, it is common to have bruising and tenderness at the incision site. Follow instructions from your health care provider about how to take care of your radial site incision. Check the incision every day for signs of infection. Do not lift anything that is heavier than 10 lb (4.5 kg), or the limit that you are told, until your health care provider says that it is  safe. Get help right away if the incision area swells very fast, you have bleeding at the incision site that will not stop, or your arm or hand becomes pale, cool, or numb. This information is not intended to replace advice given to you by your health care provider. Make sure you discuss any questions you have with your health care provider. Document Revised: 05/31/2020 Document Reviewed: 05/31/2020 Elsevier Patient Education  2024 [employer].

## 2023-04-07 ENCOUNTER — Ambulatory Visit (HOSPITAL_COMMUNITY)
Admit: 2023-04-07 | Discharge: 2023-04-07 | Disposition: A | Discharge status: Home / Self Care | Payer: Medicare Other | Attending: Cardiology | Admitting: Cardiology

## 2023-04-07 ENCOUNTER — Encounter (HOSPITAL_COMMUNITY): Payer: Self-pay | Admitting: Cardiology

## 2023-04-07 VITALS — BP 146/84 | HR 86

## 2023-04-07 DIAGNOSIS — I5032 Chronic diastolic (congestive) heart failure: Secondary | ICD-10-CM | POA: Diagnosis not present

## 2023-04-07 DIAGNOSIS — Z79899 Other long term (current) drug therapy: Secondary | ICD-10-CM | POA: Insufficient documentation

## 2023-04-07 DIAGNOSIS — I2510 Atherosclerotic heart disease of native coronary artery without angina pectoris: Secondary | ICD-10-CM | POA: Diagnosis not present

## 2023-04-07 DIAGNOSIS — I110 Hypertensive heart disease with heart failure: Secondary | ICD-10-CM | POA: Insufficient documentation

## 2023-04-07 DIAGNOSIS — I252 Old myocardial infarction: Secondary | ICD-10-CM | POA: Insufficient documentation

## 2023-04-07 DIAGNOSIS — M419 Scoliosis, unspecified: Secondary | ICD-10-CM | POA: Diagnosis not present

## 2023-04-07 DIAGNOSIS — I7300 Raynaud's syndrome without gangrene: Secondary | ICD-10-CM | POA: Insufficient documentation

## 2023-04-07 DIAGNOSIS — M069 Rheumatoid arthritis, unspecified: Secondary | ICD-10-CM | POA: Diagnosis not present

## 2023-04-07 DIAGNOSIS — I272 Pulmonary hypertension, unspecified: Secondary | ICD-10-CM | POA: Diagnosis not present

## 2023-04-07 DIAGNOSIS — R0602 Shortness of breath: Secondary | ICD-10-CM | POA: Insufficient documentation

## 2023-04-07 DIAGNOSIS — E785 Hyperlipidemia, unspecified: Secondary | ICD-10-CM | POA: Insufficient documentation

## 2023-04-07 LAB — BASIC METABOLIC PANEL
Anion gap: 8 (ref 5–15)
BUN: 18 mg/dL (ref 8–23)
CO2: 31 mmol/L (ref 22–32)
Calcium: 9.5 mg/dL (ref 8.9–10.3)
Chloride: 101 mmol/L (ref 98–111)
Creatinine, Ser: 0.84 mg/dL (ref 0.44–1.00)
GFR, Estimated: >60 mL/min (ref >60)
Glucose, Bld: 87 mg/dL (ref 70–99)
Potassium: 4.5 mmol/L (ref 3.5–5.1)
Sodium: 140 mmol/L (ref 135–145)

## 2023-04-07 LAB — BRAIN NATRIURETIC PEPTIDE: B Natriuretic Peptide: 675.1 pg/mL — ABNORMAL HIGH (ref 0.0–100.0)

## 2023-04-07 MED ORDER — MACITENTAN 10 MG PO TABS: 10.0000 mg | ORAL_TABLET | Freq: Every day | ORAL | Status: DC

## 2023-04-07 NOTE — Patient Instructions (Signed)
Great to see you today!!!  START Opsumit 10 mg Daily, this is a specialty medication that has to come from a specialty pharmacy, the company will call you to send a free 30 day trial and once approved by your insurance the specialty pharmacy will call you before shipping medication  Labs done today, your results will be available in MyChart, we will contact you for abnormal readings.  Your physician recommends that you return for lab work in: 1-2 weeks  Your provider has recommended you have the following radiology testing: Chest X-ray VQ Scan High Res CT Scan **Once your insurance approves you will be called to schedule these  You have been referred to Dr Dimple Casey at Rheumatology, his office will call you to schedule  Please follow up with our heart failure pharmacist in 3 weeks  Your physician recommends that you schedule a follow-up appointment in: 6 weeks  If you have any questions or concerns before your next appointment please send [place] a message through [place] or call our office at [phone].    TO LEAVE A MESSAGE FOR THE NURSE SELECT OPTION 2, PLEASE LEAVE A MESSAGE INCLUDING: YOUR NAME DATE OF BIRTH CALL BACK NUMBER REASON FOR CALL**this is important as we prioritize the call backs  YOU WILL RECEIVE A CALL BACK THE SAME DAY AS LONG AS YOU CALL BEFORE 4:00 PM  At the [facility], you and your health needs are our priority. As part of our continuing mission to provide you with exceptional heart care, we have created designated Provider Care Teams. These Care Teams include your primary Cardiologist (physician) and Advanced Practice Providers (APPs- Physician Assistants and Nurse Practitioners) who all work together to provide you with the care you need, when you need it.   You may see any of the following providers on your designated Care Team at your next follow up: Dr Arvilla Meres Dr Marca Ancona Dr. Dorthula Nettles Dr. Clearnce Hasten Amy Filbert Schilder,  NP Robbie Lis, [place] [facility] [place], [place] Brynda Peon, NP [place] Lee, NP Karle Plumber, PharmD   Please be sure to bring in all your medications bottles to every appointment.    Thank you for choosing [facility]

## 2023-04-08 LAB — CENTROMERE ANTIBODIES: Centromere Ab Screen: <0.2 AI (ref 0.0–0.9)

## 2023-04-08 LAB — ANTI-SCLERODERMA ANTIBODY: Scleroderma (Scl-70) (ENA) Antibody, IgG: <0.2 AI (ref 0.0–0.9)

## 2023-04-08 LAB — RHEUMATOID FACTOR: Rheumatoid fact SerPl-aCnc: 395.5 [IU]/mL — ABNORMAL HIGH (ref <14.0)

## 2023-04-08 NOTE — Progress Notes (Signed)
PCP: Pincus Sanes, MD HF Cardiology: Dr. Shirlee Latch  72 y.o. with history of pulmonary hypertension referred by Dr. Birdena Crandall for pulmonary hypertension evaluation.  Patient has a history of rheumatoid arthritis though not currently treated, kyphoscoliosis, prior breast cancer, NSTEMI in 1991 and 1998 thought to be due to vasospasm, and asthma since childhood. She had an echo in 12/23 showing normal LV EF 60-65%, mild RV enlargement with normal RV systolic function, PASP 48 mmHg. She has developed progressive dyspnea especially over the last year.  She is short of breath walking around stores though she still does her own grocery shopping.  She generally does ok walking short distances around her house.  Uses walker. She is mildly short of breath dressing or if she "hurries."  Occasional lightheadedness if she stands too fast. Has a history of orthostatic hypotension when taking amlodipine.  No syncope.  No chest pain. She has joint pain in her hands and has Raynaud's phenomenon.  She is not currently on treatment for RA.  She has had a long history of asthma, sees Dr. Delton Coombes for this. She wears oxygen at night, rarely during the day though oxygen saturation today is 90% at rest.   She had LHC/RHC in 12/24, this showed no significant CAD and normal filling pressures but CI was mildly low at 2.1 and there was severe pulmonary arterial hypertension with PVR 10.4 WU.    ECG (11/24, personally reviewed): NSR with inferior and anterolateral ST depression/T wave inversions  Labs (10/24): LDL 56 Labs (11/24): pro-BNP 1389, hgb 13.8, K 4.2, creatinine 0.93  PMH: 1. Hypothyroidism 2. CAD: NSTEMI thought to be related to vasospasm in 1991 and 1998.  - LHC (12/24): Normal coronaries.  3. HTN: Orthostatic hypotension with amlodipine.  4. Hyperlipidemia 5. Atrial septal aneurysm 6. Asthma: Since childhood, sees Dr. Delton Coombes 7. Breast cancer: S/p lumpectomy, radiation.  8. Rheumatoid arthritis: Not on DMARDS.   9. Raynaud's phenomenon.  10. Pulmonary hypertension: Echo (12/23) with EF 60-65%, mild RV enlargement with normal RV systolic function, severe RAE, PASP 48 mmHg.  - RHC (12/24): mean RA 8, PA 78/25 mean 46, mean PCWP 14, CI 2.1, PVR 10.4 WU, PAPi 6.6.  11. Kyphoscoliosis 12. Right THR  SH: Widow, lives alone in [place], 2 children locally. Nonsmoker. No ETOH.   Family History  Problem Relation Age of Onset   Heart failure Mother    Pneumonia Mother    Hypertension Mother    Heart disease Mother    Stroke Maternal Grandfather    Hypertension Maternal Grandfather    Heart attack Father    Heart disease Father    Asthma Paternal Uncle        PAT UNCLES   Heart attack Paternal Uncle    ROS: All systems reviewed and negative except as per HPI.   Current Outpatient Medications  Medication Sig Dispense Refill   albuterol (VENTOLIN HFA) 108 (90 Base) MCG/ACT inhaler Inhale 2 puffs into the lungs every 4 (four) hours as needed for wheezing or shortness of breath. 1 each 11   atorvastatin (LIPITOR) 40 MG tablet Take 1 tablet (40 mg total) by mouth daily. 90 tablet 0   bisoprolol (ZEBETA) 5 MG tablet TAKE 1 TABLET BY MOUTH DAILY 90 tablet 1   clobetasol cream (TEMOVATE) 0.05 % Apply 1 Application topically as needed.     DULoxetine (CYMBALTA) 30 MG capsule TAKE ONE CAPSULE EVERY DAY IN ADDITION TO 60MG  CAPSULE FOR A TOTAL OF 90MG  DAILY 90 capsule 1  DULoxetine (CYMBALTA) 60 MG capsule TAKE ONE CAPSULE BY MOUTH EVERY DAY TAKE ALONG WITH 30MG  CAPSULE. TOTAL DAILY DOSE is 90MG . 90 capsule 1   famotidine (PEPCID) 20 MG tablet TAKE ONE TABLET EVERY DAY AFTER SUPPER 90 tablet 2   fluticasone furoate-vilanterol (BREO ELLIPTA) 100-25 MCG/ACT AEPB INHALE 1 PUFF INTO THE LUNGS DAILY 60 each 11   furosemide (LASIX) 20 MG tablet Take 1 tablet (20 mg total) by mouth daily. 90 tablet 3   gabapentin (NEURONTIN) 600 MG tablet TAKE ONE TABLET BY MOUTH EVERY DAY AT BEDTIME 90 tablet 1    HYDROcodone-acetaminophen (NORCO) 10-325 MG tablet Take 1 tablet by mouth 3 (three) times daily as needed. 90 tablet 0   ipratropium-albuterol (DUONEB) 0.5-2.5 (3) MG/3ML SOLN Take 3 mLs by nebulization every 6 (six) hours as needed (wheezing or SOB). During exacerbation 120 mL 3   irbesartan (AVAPRO) 75 MG tablet TAKE 1 TABLET(75 MG) BY MOUTH DAILY 90 tablet 1   levothyroxine (SYNTHROID) 100 MCG tablet TAKE 1 TABLET(100 MCG) BY MOUTH DAILY 90 tablet 3   macitentan (OPSUMIT) 10 MG tablet Take 1 tablet (10 mg total) by mouth daily.     zaleplon (SONATA) 5 MG capsule Take 1 capsule (5 mg total) by mouth at bedtime as needed for sleep. 30 capsule 0   aspirin EC 81 MG tablet Take 1 tablet (81 mg total) by mouth daily. Swallow whole. (Patient not taking: Reported on 04/07/2023) 90 tablet 3   No current facility-administered medications for this encounter.   BP (!) 146/84   Pulse 86   SpO2 90%  General: NAD, kyphoscoliotic Neck: JVP 8-9 cm, no thyromegaly or thyroid nodule.  Lungs: Clear to auscultation bilaterally with normal respiratory effort. CV: Nondisplaced PMI.  Heart regular S1/S2, no S3/S4, no murmur.  1+ ankle edema.  No carotid bruit.  Normal pedal pulses.  Abdomen: Soft, nontender, no hepatosplenomegaly, no distention.  Skin: Intact without lesions or rashes.  Neurologic: Alert and oriented x 3.  Psych: Normal affect. Extremities: No clubbing or cyanosis. PIP and DIP joint swelling with mild ulnar deviation of the hands.  HEENT: Normal.   Assessment/Plan: 1. Pulmonary hypertension/RV failure: Echo in 12/23 with mild RV enlargement and estimate PASP 48.  RHC in 12/24 with normal filling pressures, CI 2.1, and severe PAH with mean PA pressure 46 and PVR 10.4 WU.  CXR in 8/24 showed no active disease.  I suspect group 1 PH possibly related to rheumatologic disease. She has been given the diagnosis of RA in the past though not currently being treated, and she has a h/o Raynaud's  phenomenon. Need to rule out group 3 PH and CTEPH.  NYHA class III symptoms, on exam today she does look mildly volume overloaded.  - I will arrange for high resolution CT chest to look for ILD/lung parenchymal disease and will also get PFTs.  - I will arrange for V/Q scan to look for evidence of chronic PEs.  - Send rheumatologic serologies: RF, ANA, SCL-70, anti-centromere, anti-CCP.  - Send BNP - She needs repeat echo , I will order.  - She is unable to do 6 minute walk today as she did not bring her walker.  - I will start her on Opsumit 10 mg daily.  - She will followup in 2-3 wks in HF pharmacy clinic to start tadalafil.  - Increase Lasix to 40 mg daily and add KCl 10 daily.  2. Rheumatoid arthritis: Patient carries this diagnosis and has hand changes consistent  with RA.  She is not on disease-specific meds currently.  She also has Raynaud's phenomenon.   - Refer to rheumatology.  3. CAD: Patient had NSTEMIs in 1991 and 1998, per her report thought to be due to vasospasm.  LHC in 12/24 showed on significant coronary disease. K 4. Hyperlipidemia: She is on atorvastatin, good lipids in 10/24.  5. Kyphoscoliosis: This likely contributed to her dyspnea via chest wall restriction.  - To get PFTs as above.  6. HTN: BP mildly elevated today.  Will continue current regimen of bisoprolol and irbesartan for now.  She did not tolerate amlodipine well due to orthostatic hypotension.   Followup 2-3 wks HF pharmacist for med titration, see me in 6 wks.   Hannah Scott 04/08/2023

## 2023-04-10 ENCOUNTER — Telehealth (HOSPITAL_COMMUNITY): Payer: Self-pay | Admitting: *Deleted

## 2023-04-10 ENCOUNTER — Other Ambulatory Visit (HOSPITAL_COMMUNITY): Payer: Self-pay

## 2023-04-10 DIAGNOSIS — I272 Pulmonary hypertension, unspecified: Secondary | ICD-10-CM

## 2023-04-10 DIAGNOSIS — M069 Rheumatoid arthritis, unspecified: Secondary | ICD-10-CM

## 2023-04-10 DIAGNOSIS — I5032 Chronic diastolic (congestive) heart failure: Secondary | ICD-10-CM

## 2023-04-10 LAB — CYCLIC CITRUL PEPTIDE ANTIBODY, IGG/IGA: CCP Antibodies IgG/IgA: >250 U — ABNORMAL HIGH (ref 0–19)

## 2023-04-10 NOTE — Telephone Encounter (Signed)
Called patient per Dr. Shirlee Latch with following lab results and recommendations:  "Elevated RF suggestive of rheumatoid arthritis.  Refer to rheumatology."  Pt verbalized understanding and agreement to above. Will send referral to rheumatology and they will call patient with first appointment. Pt says she has seen Dr. Corliss Skains in th past and likes her.

## 2023-04-11 ENCOUNTER — Telehealth (HOSPITAL_COMMUNITY): Payer: Self-pay

## 2023-04-11 NOTE — Telephone Encounter (Signed)
Advanced Heart Failure Patient Advocate Encounter  PSMN and REMS forms faxed accordingly on 04/10/2023.  Burnell Blanks, CPhT Rx Patient Advocate Phone: [phone]

## 2023-04-11 NOTE — Telephone Encounter (Signed)
Advanced Heart Failure Patient Advocate Encounter  Prior authorization for Opsumit has been submitted and approved. Test billing returns [phone] for 30 day supply. Patient may be eligible for a grant that would cover the cost of this medication. I will reach out to pt in a separate encounter.  Key: [id number] Effective: 04/10/2023 to 04/09/2024  Burnell Blanks, CPhT Rx Patient Advocate Phone: [phone]

## 2023-04-12 ENCOUNTER — Other Ambulatory Visit (HOSPITAL_COMMUNITY): Payer: Medicare Other

## 2023-04-12 NOTE — Telephone Encounter (Signed)
Advanced Heart Failure Patient Advocate Encounter  REMS requested an updated copy of the patients form, resent via fax.

## 2023-04-13 ENCOUNTER — Telehealth (HOSPITAL_COMMUNITY): Payer: Self-pay

## 2023-04-13 ENCOUNTER — Other Ambulatory Visit (HOSPITAL_COMMUNITY): Payer: Self-pay

## 2023-04-13 NOTE — Telephone Encounter (Addendum)
Advanced Heart Failure Patient Advocate Encounter  The patient was approved for a [employer] grant that will help cover the cost of Opsumit.  Total amount awarded, $10,000.  Effective: 03/14/2023 - 03/12/2024.  BIN [id number] PCN [id number] Group [id number] ID [id number]  Pharmacy will be provided with approval and processing information when refill is due. Patient informed via phone.  Burnell Blanks, CPhT Rx Patient Advocate Phone: [phone]

## 2023-04-14 ENCOUNTER — Ambulatory Visit (HOSPITAL_COMMUNITY): Payer: Medicare Other | Attending: Cardiology

## 2023-04-14 ENCOUNTER — Encounter: Payer: Self-pay | Admitting: Registered Nurse

## 2023-04-14 ENCOUNTER — Encounter: Payer: Medicare Other | Attending: Registered Nurse | Admitting: Registered Nurse

## 2023-04-14 VITALS — BP 114/73 | HR 73 | Ht 60.0 in | Wt 129.0 lb

## 2023-04-14 DIAGNOSIS — G8929 Other chronic pain: Secondary | ICD-10-CM | POA: Insufficient documentation

## 2023-04-14 DIAGNOSIS — I371 Nonrheumatic pulmonary valve insufficiency: Secondary | ICD-10-CM | POA: Insufficient documentation

## 2023-04-14 DIAGNOSIS — M25562 Pain in left knee: Secondary | ICD-10-CM | POA: Insufficient documentation

## 2023-04-14 DIAGNOSIS — I340 Nonrheumatic mitral (valve) insufficiency: Secondary | ICD-10-CM | POA: Insufficient documentation

## 2023-04-14 DIAGNOSIS — M48062 Spinal stenosis, lumbar region with neurogenic claudication: Secondary | ICD-10-CM | POA: Insufficient documentation

## 2023-04-14 DIAGNOSIS — G894 Chronic pain syndrome: Secondary | ICD-10-CM | POA: Insufficient documentation

## 2023-04-14 DIAGNOSIS — M24551 Contracture, right hip: Secondary | ICD-10-CM | POA: Diagnosis not present

## 2023-04-14 DIAGNOSIS — I351 Nonrheumatic aortic (valve) insufficiency: Secondary | ICD-10-CM | POA: Diagnosis not present

## 2023-04-14 DIAGNOSIS — Z79891 Long term (current) use of opiate analgesic: Secondary | ICD-10-CM | POA: Diagnosis not present

## 2023-04-14 DIAGNOSIS — Z5181 Encounter for therapeutic drug level monitoring: Secondary | ICD-10-CM | POA: Diagnosis not present

## 2023-04-14 LAB — ECHOCARDIOGRAM COMPLETE
Area-P 1/2: 9.98 cm2
MV M vel: 4.58 m/s
MV Peak grad: 83.9 mm[Hg]
S' Lateral: 2.2 cm

## 2023-04-14 MED ORDER — HYDROCODONE-ACETAMINOPHEN 10-325 MG PO TABS: 1.0000 | ORAL_TABLET | Freq: Three times a day (TID) | ORAL | 0 refills | Status: DC | PRN

## 2023-04-14 NOTE — Progress Notes (Unsigned)
Subjective:    Patient ID: Hannah Scott, female    DOB: November 18, 1950, 72 y.o.   MRN: [id number]  HPI: Hannah Scott is a 72 y.o. female who returns for follow up appointment for chronic pain and medication refill. states *** pain is located in  ***. rates pain ***. current exercise regime is walking and performing stretching exercises.  Hannah Scott equivalent is *** MME.   Last Oral Swab was Performed on 01/03/2023, it was consistent.      Pain Inventory Average Pain 7 Pain Right Now 8 My pain is constant, burning, tingling, and aching  In the last 24 hours, has pain interfered with the following? General activity 10 Relation with others 10 Enjoyment of life 10 What TIME of day is your pain at its worst? morning , daytime, evening, and night Sleep (in general) Poor  Pain is worse with: walking, bending, standing, and some activites Pain improves with: rest and medication Relief from Meds: 5  Family History  Problem Relation Age of Onset   Heart failure Mother    Pneumonia Mother    Hypertension Mother    Heart disease Mother    Stroke Maternal Grandfather    Hypertension Maternal Grandfather    Heart attack Father    Heart disease Father    Asthma Paternal Uncle        PAT UNCLES   Heart attack Paternal Uncle    Social History   Socioeconomic History   Marital status: Widowed    Spouse name: Not on file   Number of children: Not on file   Years of education: Not on file   Highest education level: Not on file  Occupational History   Not on file  Tobacco Use   Smoking status: Never    Passive exposure: Never   Smokeless tobacco: Never  Vaping Use   Vaping status: Never Used  Substance and Sexual Activity   Alcohol use: No    Alcohol/week: 0.0 standard drinks of alcohol   Drug use: No   Sexual activity: Not Currently    Partners: Male    Birth control/protection: Surgical  Other Topics Concern   Not on file  Social History Narrative   Widowed in 2015    2 sons 1 lives in the area the other is in close contact    1 grandchild   Never smoker no drug use no alcohol   Social Drivers of Corporate investment banker Strain: Low Risk  (01/11/2022)   Overall Financial Resource Strain (CARDIA)    Difficulty of Paying Living Expenses: Not hard at all  Food Insecurity: No Food Insecurity (01/11/2022)   Hunger Vital Sign    Worried About Running Out of Food in the Last Year: Never true    Ran Out of Food in the Last Year: Never true  Transportation Needs: No Transportation Needs (01/11/2022)   PRAPARE - Administrator, Civil Service (Medical): No    Lack of Transportation (Non-Medical): No  Physical Activity: Inactive (01/11/2022)   Exercise Vital Sign    Days of Exercise per Week: 0 days    Minutes of Exercise per Session: 0 min  Stress: No Stress Concern Present (01/11/2022)   [employer] of Occupational Health - Occupational Stress Questionnaire    Feeling of Stress : Not at all  Social Connections: Socially Integrated (01/11/2022)   Social Connection and Isolation Panel [NHANES]    Frequency of Communication with Friends and Family:  More than three times a week    Frequency of Social Gatherings with Friends and Family: More than three times a week    Attends Religious Services: More than 4 times per year    Active Member of Clubs or Organizations: Yes    Attends Engineer, structural: More than 4 times per year    Marital Status: Married   Past Surgical History:  Procedure Laterality Date   ANKLE SURGERY Left    ligament   APPENDECTOMY  1987   AT [facility]   BACK SURGERY     Fusion   BREAST LUMPECTOMY  2003   radiation on right   BREAST LUMPECTOMY WITH RADIOACTIVE SEED LOCALIZATION Left 02/12/2016   Procedure: LEFT BREAST LUMPECTOMY WITH RADIOACTIVE SEED LOCALIZATION;  Surgeon: Glenna Fellows, MD;  Location: [facility];  Service: General;  Laterality: Left;   CARDIOVASCULAR STRESS TEST  09/07/05    Nuclear, was negative   CATARACT EXTRACTION Bilateral 01,03   COLONOSCOPY WITH PROPOFOL N/A 05/04/2021   Procedure: COLONOSCOPY WITH PROPOFOL;  Surgeon: Iva Boop, MD;  Location: [facility];  Service: Endoscopy;  Laterality: N/A;   ESOPHAGOGASTRODUODENOSCOPY (EGD) WITH PROPOFOL N/A 05/04/2021   Procedure: ESOPHAGOGASTRODUODENOSCOPY (EGD) WITH PROPOFOL;  Surgeon: Iva Boop, MD;  Location: [facility];  Service: Endoscopy;  Laterality: N/A;   OOPHORECTOMY     LSO -RSO   PELVIC LAPAROSCOPY  1989   RSO, LYSIS OF ADHESIONS   POLYPECTOMY  05/04/2021   Procedure: POLYPECTOMY;  Surgeon: Iva Boop, MD;  Location: [facility];  Service: Endoscopy;;   RIGHT/LEFT HEART CATH AND CORONARY ANGIOGRAPHY N/A 03/28/2023   Procedure: RIGHT/LEFT HEART CATH AND CORONARY ANGIOGRAPHY;  Surgeon: Laurey Morale, MD;  Location: [facility];  Service: Cardiovascular;  Laterality: N/A;   TOTAL ABDOMINAL HYSTERECTOMY  1987   LSO, APPENDECTOMY   TOTAL HIP ARTHROPLASTY Right 10/28/2013   dr Ophelia Charter   TOTAL HIP ARTHROPLASTY Right 10/28/2013   Procedure: TOTAL HIP ARTHROPLASTY ANTERIOR APPROACH;  Surgeon: Eldred Manges, MD;  Location: [facility];  Service: Orthopedics;  Laterality: Right;  Right Total Hip Arthroplasty, Direct Anterior Approach   Past Surgical History:  Procedure Laterality Date   ANKLE SURGERY Left    ligament   APPENDECTOMY  1987   AT [facility]   BACK SURGERY     Fusion   BREAST LUMPECTOMY  2003   radiation on right   BREAST LUMPECTOMY WITH RADIOACTIVE SEED LOCALIZATION Left 02/12/2016   Procedure: LEFT BREAST LUMPECTOMY WITH RADIOACTIVE SEED LOCALIZATION;  Surgeon: Glenna Fellows, MD;  Location: [facility];  Service: General;  Laterality: Left;   CARDIOVASCULAR STRESS TEST  09/07/05   Nuclear, was negative   CATARACT EXTRACTION Bilateral 01,03   COLONOSCOPY WITH PROPOFOL N/A 05/04/2021   Procedure: COLONOSCOPY WITH PROPOFOL;  Surgeon: Iva Boop, MD;  Location:  [facility];  Service: Endoscopy;  Laterality: N/A;   ESOPHAGOGASTRODUODENOSCOPY (EGD) WITH PROPOFOL N/A 05/04/2021   Procedure: ESOPHAGOGASTRODUODENOSCOPY (EGD) WITH PROPOFOL;  Surgeon: Iva Boop, MD;  Location: [facility];  Service: Endoscopy;  Laterality: N/A;   OOPHORECTOMY     LSO -RSO   PELVIC LAPAROSCOPY  1989   RSO, LYSIS OF ADHESIONS   POLYPECTOMY  05/04/2021   Procedure: POLYPECTOMY;  Surgeon: Iva Boop, MD;  Location: [facility];  Service: Endoscopy;;   RIGHT/LEFT HEART CATH AND CORONARY ANGIOGRAPHY N/A 03/28/2023   Procedure: RIGHT/LEFT HEART CATH AND CORONARY ANGIOGRAPHY;  Surgeon: Laurey Morale, MD;  Location: [facility];  Service: Cardiovascular;  Laterality: N/A;   TOTAL ABDOMINAL HYSTERECTOMY  1987   LSO, APPENDECTOMY   TOTAL HIP ARTHROPLASTY Right 10/28/2013   dr Ophelia Charter   TOTAL HIP ARTHROPLASTY Right 10/28/2013   Procedure: TOTAL HIP ARTHROPLASTY ANTERIOR APPROACH;  Surgeon: Eldred Manges, MD;  Location: [facility];  Service: Orthopedics;  Laterality: Right;  Right Total Hip Arthroplasty, Direct Anterior Approach   Past Medical History:  Diagnosis Date   Anemia    hx   Arthritis    Asthma    PFTs, February, 2011, moderate obstructive disease with response to bronchodilators, normal lung volumes, moderate reduction in diffusing capacity   Atrial septal aneurysm    Echo, 2008-not noted on 13 echo   CAD (coronary artery disease)    90% distal LAD in the past  /   nuclear, 2008, no ischemia, ejection fraction 70%   Cancer (HCC) 2002   DUCTAL CIS--S/P LUMPECTOMY, RADIATION AND 6 WEEKS OF TAMOXIFEN   D-dimer, elevated    January, 2014   Depression    Ejection fraction    EF 60%, echo, October, 2008   Elevated CPK    January, 2014   Endometriosis 1989   RIGHT TUBE   Endometriosis 1987   LEFT TUBE/OVARY W FOCAL IN-SITU ENDOMETRIAL ADENOCARCINOMA   GERD (gastroesophageal reflux disease)    occ   H/O hiatal hernia    ?   Hyperlipidemia     Hypertension    Hypothyroidism    Patient has had in the past that she does not need treatment   Kyphoscoliosis    Obstructive airway disease (HCC)    Pinched nerve    lower back   Shortness of breath    Echo 2/22: EF 60-65, no RWMA, mild LVH, GR 1  DD, GLS -24%, normal RVSF, mild MR, trivial AI, borderline dilation of aortic root (39 mm)   UTI (lower urinary tract infection)    Ht 1' (0.305 m)   Wt 129 lb (58.5 kg)   BMI 629.84 kg/m   Opioid Risk Score:   Fall Risk Score:  `1  Depression screen PHQ 2/9     03/16/2023    2:16 PM 01/03/2023    2:31 PM 11/22/2022    3:06 PM 10/21/2022    2:10 PM 09/13/2022    2:51 PM 08/15/2022   11:52 AM 07/25/2022    8:44 AM  Depression screen PHQ 2/9  Decreased Interest 0 1 0 1 1 1  0  Down, Depressed, Hopeless 0 1 0 1 1 1  0  PHQ - 2 Score 0 2 0 2 2 2  0  Altered sleeping       2  Tired, decreased energy       3  Change in appetite       0  Feeling bad or failure about yourself        0  Trouble concentrating       0  Moving slowly or fidgety/restless       0  Suicidal thoughts       0  PHQ-9 Score       5  Difficult doing work/chores       Somewhat difficult    Review of Systems  Musculoskeletal:  Positive for back pain and gait problem.       PAIN IN KNEES, HIPS, RIGHT SHOULDER  All other systems reviewed and are negative.     Objective:   Physical Exam  Assessment & Plan:

## 2023-04-14 NOTE — Telephone Encounter (Signed)
Advanced Heart Failure Patient Advocate Encounter  Received notification from [employer] that free trial was shipped to patient on 04/13/23.

## 2023-04-17 ENCOUNTER — Encounter: Payer: Medicare Other | Admitting: Registered Nurse

## 2023-04-17 ENCOUNTER — Ambulatory Visit (HOSPITAL_COMMUNITY)
Admission: RE | Admit: 2023-04-17 | Discharge: 2023-04-17 | Disposition: A | Discharge status: Home / Self Care | Payer: Medicare Other | Source: Ambulatory Visit | Attending: Cardiology | Admitting: Cardiology

## 2023-04-17 DIAGNOSIS — I5032 Chronic diastolic (congestive) heart failure: Secondary | ICD-10-CM | POA: Insufficient documentation

## 2023-04-17 LAB — BASIC METABOLIC PANEL
Anion gap: 7 (ref 5–15)
BUN: 21 mg/dL (ref 8–23)
CO2: 33 mmol/L — ABNORMAL HIGH (ref 22–32)
Calcium: 9.4 mg/dL (ref 8.9–10.3)
Chloride: 96 mmol/L — ABNORMAL LOW (ref 98–111)
Creatinine, Ser: 0.86 mg/dL (ref 0.44–1.00)
GFR, Estimated: >60 mL/min (ref >60)
Glucose, Bld: 95 mg/dL (ref 70–99)
Potassium: 3.9 mmol/L (ref 3.5–5.1)
Sodium: 136 mmol/L (ref 135–145)

## 2023-04-17 NOTE — Progress Notes (Incomplete)
***In Progress***    Advanced Heart Failure Clinic Note   PCP: Pincus Sanes, MD HF Cardiology: Dr. Shirlee Latch  HPI:  72 y.o. with history of pulmonary hypertension referred by Dr. Izora Ribas for pulmonary hypertension evaluation.  Patient has a history of rheumatoid arthritis though not currently treated, kyphoscoliosis, prior breast cancer, NSTEMI in 1991 and 1998 thought to be due to vasospasm, and asthma since childhood. She had an echo in 03/2022 showing normal LV EF 60-65%, mild RV enlargement with normal RV systolic function, PASP 48 mmHg.   Presented to AHF Clinic with Dr. Shirlee Latch 04/07/23. She reported that she had developed progressive dyspnea especially over the last year.  She was short of breath walking around stores though she still does her own grocery shopping.  She generally does ok walking short distances around her house.  Uses walker. She was mildly short of breath dressing or if she "hurries."  Noted occasional lightheadedness if she stands too fast. Has a history of orthostatic hypotension when taking amlodipine.  No syncope.  No chest pain. She had joint pain in her hands and has Raynaud's phenomenon.  She is not currently on treatment for RA.  She has had a long history of asthma, sees Dr. Delton Coombes for this. Noted that she wears oxygen at night, rarely during the day though oxygen saturation in clinic is 90% at rest.    She had LHC/RHC in 03/2023, this showed no significant CAD and normal filling pressures but CI was mildly low at 2.1 and there was severe pulmonary arterial hypertension with PVR 10.4 WU and mPAP 46 mmHg.      Today he returns to HF clinic for pharmacist medication titration. At last visit with MD Lasix was increased to 40 mg daily and KCL 20 mEq daily was initiated. Additionally, Opsumit was started.   Shortness of breath/dyspnea on exertion? {YES NO:22349}  Orthopnea/PND? {YES NO:22349} Edema? {YES NO:22349} Lightheadedness/dizziness? {YES NO:22349} Daily  weights at home? {YES NO:22349} Blood pressure/heart rate monitoring at home? {YES [id number] Following low-sodium/fluid-restricted diet? {YES NO:22349}   Has the patient been experiencing any side effects to the medications prescribed?  {YES NO:22349}  Does the patient have any problems obtaining medications due to transportation or finances?   {YES NO:22349}  Understanding of regimen: {excellent/good/fair/poor:19665} Understanding of indications: {excellent/good/fair/poor:19665} Potential of compliance: {excellent/good/fair/poor:19665} Patient understands to avoid NSAIDs. Patient understands to avoid decongestants.    Pertinent Lab Values: Serum creatinine ***, BUN ***, Potassium ***, Sodium ***, BNP ***, Magnesium ***, Digoxin ***   Vital Signs: Weight: *** (last clinic weight: ***) Blood pressure: ***  Heart rate: ***   Assessment/Plan: 1. Pulmonary hypertension/RV failure: Echo in 03/2022 with mild RV enlargement and estimate PASP 48.  RHC in 03/2023 with normal filling pressures, CI 2.1, and severe PAH with mean PA pressure 46 and PVR 10.4 WU.  CXR in 11/2022 showed no active disease.  Suspect group 1 PH possibly related to rheumatologic disease. She has been given the diagnosis of RA in the past though not currently being treated, and she has a h/o Raynaud's phenomenon. - NYHA class III symptoms, on exam today she does look mildly volume overloaded.  - Increase Lasix to 40 mg daily and add KCl 10 daily.  -Continue Opsumit 10 mg daily.  - She will followup in 2-3 wks in HF pharmacy clinic to start tadalafil.  -  Need to rule out group 3 PH and CTEPH.  Referred for high resolution CT chest to look for ILD/lung  parenchymal disease and will also get PFTs - ordered last visit. Previously referred for V/Q scan to look for evidence of chronic PEs.  2. Rheumatoid arthritis: Patient carries this diagnosis and has hand changes consistent with RA.  She is not on disease-specific meds  currently.  She also has Raynaud's phenomenon.   - Referred to rheumatology last visit.  3. CAD: Patient had NSTEMIs in 1991 and 1998, per her report thought to be due to vasospasm.  LHC in 03/2023 showed no significant coronary disease.  4. Hyperlipidemia: She is on atorvastatin, good lipids in 01/2023.  5. Kyphoscoliosis: This likely contributed to her dyspnea via chest wall restriction.  - Get PFTs as above.  6. HTN: BP mildly elevated today.  Will continue current regimen of bisoprolol and irbesartan for now.  She did not tolerate amlodipine well due to orthostatic hypotension. ***  Follow up ***   Karle Plumber, PharmD, BCPS, BCCP, CPP Heart Failure Clinic Pharmacist [phone]

## 2023-04-23 DIAGNOSIS — R0609 Other forms of dyspnea: Secondary | ICD-10-CM | POA: Diagnosis not present

## 2023-04-23 DIAGNOSIS — J4540 Moderate persistent asthma, uncomplicated: Secondary | ICD-10-CM | POA: Diagnosis not present

## 2023-05-01 ENCOUNTER — Inpatient Hospital Stay (HOSPITAL_COMMUNITY): Admission: RE | Admit: 2023-05-01 | Payer: Medicare Other | Source: Ambulatory Visit

## 2023-05-01 NOTE — Progress Notes (Signed)
 Advanced Heart Failure Clinic Note   PCP: Hannah Glade PARAS, MD HF Cardiology: Dr. Rolan  HPI:  73 y.o. with history of pulmonary hypertension referred by Dr. Santo for pulmonary hypertension evaluation.  Patient has a history of rheumatoid arthritis though not currently treated, kyphoscoliosis, prior breast cancer, NSTEMI in 1991 and 1998 thought to be due to vasospasm, and asthma since childhood. She had an echo in 03/2022 showing normal LV EF 60-65%, mild RV enlargement with normal RV systolic function, PASP 48 mmHg.   Presented to AHF Clinic with Dr. Rolan 04/07/23. She reported that she had developed progressive dyspnea especially over the last year.  She was short of breath walking around stores though she still does her own grocery shopping.  She generally does ok walking short distances around her house.  Uses walker. She was mildly short of breath dressing or if she hurries.  Noted occasional lightheadedness if she stands too fast. Has a history of orthostatic hypotension when taking amlodipine .  No syncope.  No chest pain. She had joint pain in her hands and has Raynaud's phenomenon.  She is not currently on treatment for RA.  She has had a long history of asthma, sees Dr. Shelah for this. Noted that she wears oxygen  at night, rarely during the day though oxygen  saturation in clinic is 90% at rest.    She had LHC/RHC in 03/2023, this showed no significant CAD and normal filling pressures but CI was mildly low at 2.1 and there was severe pulmonary arterial hypertension with PVR 10.4 WU and mPAP 46 mmHg.     Today she returns to HF clinic for pharmacist medication titration. At last visit with MD, Lasix  was increased to 40 mg daily and KCL 20 mEq daily was initiated. Additionally, Opsumit  was started. Received free trial of Opsumit  from [employer] and was able to start. She increased the Lasix  as above, but did not start KCL supplement as it was not sent to her pharmacy. Overall she  is feeling well today. Sometimes will get lightheaded. Has noticed this happening more since Lasix  was increased. She checks her BP during these episodes and SBP can be 98-100 mmHg. She sits down during these episodes so she does not fall. Her SBP at home is usually 106-110 mmHg. Has significant fatigue. No CP or palpitations. Gets short of breath walking short distances using her walker, this is stable. Uses oxygen  at night. Taking Lasix  40 mg daily and has not needed any extra. No LEE, PND or orthopnea. Is worried about side effect of throat hoarseness from Opsumit  since she already has this at baseline.    Has the patient been experiencing any side effects to the medications prescribed?  no  Does the patient have any problems obtaining medications due to transportation or finances?  [employer] Medicare. Has [employer] grant for pulmonary hypertension medications. Needs to call [facility] to schedule delivery of Opsumit , I provided her with their number today.   Understanding of regimen: good Understanding of indications: good Potential of compliance: good Patient understands to avoid NSAIDs. Patient understands to avoid decongestants.    Pertinent Lab Values: 04/17/23: Serum creatinine 0.86, BUN 21, Potassium 3.9, Sodium 136  Vital Signs: Weight: 132.2 lbs (last clinic weight: 129 lbs) Blood pressure: 110/70  Heart rate: 88   Assessment/Plan: 1. Pulmonary hypertension/RV failure: Echo in 03/2022 with mild RV enlargement and estimate PASP 48.  RHC in 03/2023 with normal filling pressures, CI 2.1, and severe PAH with mean  PA pressure 46 and PVR 10.4 WU.  CXR in 11/2022 showed no active disease.  Suspect group 1 PH possibly related to rheumatologic disease. She has been given the diagnosis of RA in the past though not currently being treated, and she has a h/o Raynaud's phenomenon. - NYHA class III symptoms, appears euvolemic on exam.  - Continue Lasix  40 mg daily. She did not start KCL  supplement last visit as medication was not sent to her pharmacy. Since K 3.9 on 04/17/23 labs, will hold off on starting today.  - Continue Opsumit  10 mg daily.  - Start tadalafil  20 mg daily -  Need to rule out group 3 PH and CTEPH.  Referred for high resolution CT chest to look for ILD/lung parenchymal disease and will also get PFTs - ordered last visit. Previously referred for V/Q scan to look for evidence of chronic PEs.  2. Rheumatoid arthritis: Patient carries this diagnosis and has hand changes consistent with RA.  She is not on disease-specific meds currently.  She also has Raynaud's phenomenon.   - Referred to rheumatology last visit.  3. CAD: Patient had NSTEMIs in 1991 and 1998, per her report thought to be due to vasospasm.  LHC in 03/2023 showed no significant coronary disease.  4. Hyperlipidemia: She is on atorvastatin , good lipids in 01/2023.  5. Kyphoscoliosis: This likely contributed to her dyspnea via chest wall restriction.  - Get PFTs as above.  6. HTN: BP controlled today but has been lower at home recently. Will continue current regimen of bisoprolol  and irbesartan  for now. May have to decrease at follow up visit if lightheadedness worsens.   Follow up 3 weeks with Dr. Rolan Hannah Scott, PharmD, BCPS, [facility], CPP Heart Failure Clinic Pharmacist [phone]

## 2023-05-02 ENCOUNTER — Ambulatory Visit (HOSPITAL_COMMUNITY)
Admission: RE | Admit: 2023-05-02 | Discharge: 2023-05-02 | Disposition: A | Discharge status: Home / Self Care | Payer: Medicare Other | Source: Ambulatory Visit | Attending: Internal Medicine | Admitting: Internal Medicine

## 2023-05-02 ENCOUNTER — Other Ambulatory Visit (HOSPITAL_COMMUNITY): Payer: Self-pay

## 2023-05-02 DIAGNOSIS — Z853 Personal history of malignant neoplasm of breast: Secondary | ICD-10-CM | POA: Insufficient documentation

## 2023-05-02 DIAGNOSIS — I272 Pulmonary hypertension, unspecified: Secondary | ICD-10-CM | POA: Diagnosis not present

## 2023-05-02 DIAGNOSIS — E782 Mixed hyperlipidemia: Secondary | ICD-10-CM

## 2023-05-02 DIAGNOSIS — M069 Rheumatoid arthritis, unspecified: Secondary | ICD-10-CM | POA: Diagnosis not present

## 2023-05-02 DIAGNOSIS — E785 Hyperlipidemia, unspecified: Secondary | ICD-10-CM | POA: Insufficient documentation

## 2023-05-02 DIAGNOSIS — I25119 Atherosclerotic heart disease of native coronary artery with unspecified angina pectoris: Secondary | ICD-10-CM

## 2023-05-02 DIAGNOSIS — I2510 Atherosclerotic heart disease of native coronary artery without angina pectoris: Secondary | ICD-10-CM | POA: Diagnosis not present

## 2023-05-02 DIAGNOSIS — M419 Scoliosis, unspecified: Secondary | ICD-10-CM | POA: Diagnosis not present

## 2023-05-02 DIAGNOSIS — I10 Essential (primary) hypertension: Secondary | ICD-10-CM | POA: Diagnosis not present

## 2023-05-02 DIAGNOSIS — Z79899 Other long term (current) drug therapy: Secondary | ICD-10-CM

## 2023-05-02 MED ORDER — TADALAFIL (PAH) 20 MG PO TABS: 20.0000 mg | ORAL_TABLET | Freq: Every day | ORAL | 11 refills | Status: DC

## 2023-05-02 MED ORDER — FUROSEMIDE 20 MG PO TABS: 40.0000 mg | ORAL_TABLET | Freq: Every day | ORAL | 3 refills | Status: DC

## 2023-05-02 MED ORDER — ATORVASTATIN CALCIUM 20 MG PO TABS: 20.0000 mg | ORAL_TABLET | Freq: Every day | ORAL | 3 refills | Status: DC

## 2023-05-02 NOTE — Patient Instructions (Addendum)
 It was a pleasure seeing you today!  MEDICATIONS: -We are changing your medications today -Start tadalafil  20 mg (1 tablet) daily -Please call [facility] at [phone] to schedule delivery of Opsumit . You can provide them the grant information. Call [facility] if you have any issues [phone]). -Call if you have questions about your medications.   NEXT APPOINTMENT: Return to clinic in 3 weeks with Dr. Rolan.  In general, to take care of your heart failure: -Limit your fluid intake to 2 Liters (half-gallon) per day.   -Limit your salt intake to ideally 2-3 grams (2000-3000 mg) per day. -Weigh yourself daily and record, and bring that weight diary to your next appointment.  (Weight gain of 2-3 pounds in 1 day typically means fluid weight.) -The medications for your heart are to help your heart and help you live longer.   -Please contact us  before stopping any of your heart medications.  Call the clinic at [phone] with questions or to reschedule future appointments.

## 2023-05-03 ENCOUNTER — Other Ambulatory Visit (HOSPITAL_COMMUNITY): Payer: Self-pay

## 2023-05-03 ENCOUNTER — Telehealth (HOSPITAL_COMMUNITY): Payer: Self-pay | Admitting: Pharmacy Technician

## 2023-05-03 NOTE — Telephone Encounter (Signed)
 Patient Advocate Encounter   Received notification from [facility]  that prior authorization for Tadalafil PAH is required.   PA submitted on CoverMyMeds Key  [id number]) Status is pending   Will continue to follow.

## 2023-05-04 ENCOUNTER — Telehealth (HOSPITAL_COMMUNITY): Payer: Self-pay | Admitting: Pharmacist

## 2023-05-04 NOTE — Telephone Encounter (Signed)
 Advanced Heart Failure Patient Advocate Encounter  Prior Authorization for tadalafil has been denied.  Appeal submitted via fax.   Karle Plumber, PharmD, BCPS, BCCP, CPP Heart Failure Clinic Pharmacist [phone]

## 2023-05-08 ENCOUNTER — Other Ambulatory Visit (HOSPITAL_COMMUNITY): Payer: Self-pay

## 2023-05-09 ENCOUNTER — Other Ambulatory Visit (HOSPITAL_COMMUNITY): Payer: Self-pay

## 2023-05-09 NOTE — Telephone Encounter (Addendum)
 Advanced Heart Failure Patient Advocate Encounter  PA denied, [facility]) sent appeal that has been approved. 30 day co-pay, $8.96.  Archer Asa, CPhT

## 2023-05-09 NOTE — Telephone Encounter (Signed)
 Advanced Heart Failure Patient Advocate Encounter  Prior Authorization Appeal for tadalafil has been approved.    Effective dates: 05/09/23 through 05/08/24  Karle Plumber, PharmD, BCPS, BCCP, CPP Heart Failure Clinic Pharmacist [phone]

## 2023-05-15 ENCOUNTER — Other Ambulatory Visit: Payer: Self-pay | Admitting: Internal Medicine

## 2023-05-22 ENCOUNTER — Other Ambulatory Visit (HOSPITAL_COMMUNITY): Payer: Self-pay

## 2023-05-22 ENCOUNTER — Encounter (HOSPITAL_COMMUNITY): Payer: Self-pay | Admitting: Cardiology

## 2023-05-22 ENCOUNTER — Other Ambulatory Visit: Payer: Self-pay

## 2023-05-22 ENCOUNTER — Encounter: Payer: Medicare Other | Attending: Registered Nurse | Admitting: Registered Nurse

## 2023-05-22 ENCOUNTER — Ambulatory Visit: Payer: Medicare Other | Admitting: Registered Nurse

## 2023-05-22 ENCOUNTER — Ambulatory Visit (HOSPITAL_BASED_OUTPATIENT_CLINIC_OR_DEPARTMENT_OTHER)
Admission: RE | Admit: 2023-05-22 | Discharge: 2023-05-22 | Disposition: A | Discharge status: Home / Self Care | Payer: Medicare Other | Source: Ambulatory Visit | Attending: Cardiology | Admitting: Cardiology

## 2023-05-22 VITALS — BP 130/80 | HR 89 | Wt 131.6 lb

## 2023-05-22 VITALS — BP 120/65 | HR 103 | Ht 60.0 in | Wt 130.8 lb

## 2023-05-22 DIAGNOSIS — I110 Hypertensive heart disease with heart failure: Secondary | ICD-10-CM | POA: Insufficient documentation

## 2023-05-22 DIAGNOSIS — G894 Chronic pain syndrome: Secondary | ICD-10-CM | POA: Diagnosis not present

## 2023-05-22 DIAGNOSIS — I272 Pulmonary hypertension, unspecified: Secondary | ICD-10-CM | POA: Insufficient documentation

## 2023-05-22 DIAGNOSIS — M069 Rheumatoid arthritis, unspecified: Secondary | ICD-10-CM | POA: Insufficient documentation

## 2023-05-22 DIAGNOSIS — I2510 Atherosclerotic heart disease of native coronary artery without angina pectoris: Secondary | ICD-10-CM | POA: Insufficient documentation

## 2023-05-22 DIAGNOSIS — I5032 Chronic diastolic (congestive) heart failure: Secondary | ICD-10-CM | POA: Insufficient documentation

## 2023-05-22 DIAGNOSIS — R9431 Abnormal electrocardiogram [ECG] [EKG]: Secondary | ICD-10-CM | POA: Insufficient documentation

## 2023-05-22 DIAGNOSIS — I252 Old myocardial infarction: Secondary | ICD-10-CM | POA: Insufficient documentation

## 2023-05-22 DIAGNOSIS — Z5181 Encounter for therapeutic drug level monitoring: Secondary | ICD-10-CM | POA: Insufficient documentation

## 2023-05-22 DIAGNOSIS — M24551 Contracture, right hip: Secondary | ICD-10-CM | POA: Insufficient documentation

## 2023-05-22 DIAGNOSIS — J45909 Unspecified asthma, uncomplicated: Secondary | ICD-10-CM | POA: Insufficient documentation

## 2023-05-22 DIAGNOSIS — M48062 Spinal stenosis, lumbar region with neurogenic claudication: Secondary | ICD-10-CM | POA: Insufficient documentation

## 2023-05-22 DIAGNOSIS — Z853 Personal history of malignant neoplasm of breast: Secondary | ICD-10-CM | POA: Insufficient documentation

## 2023-05-22 DIAGNOSIS — Z79891 Long term (current) use of opiate analgesic: Secondary | ICD-10-CM | POA: Insufficient documentation

## 2023-05-22 DIAGNOSIS — M40209 Unspecified kyphosis, site unspecified: Secondary | ICD-10-CM | POA: Insufficient documentation

## 2023-05-22 DIAGNOSIS — Z79899 Other long term (current) drug therapy: Secondary | ICD-10-CM | POA: Insufficient documentation

## 2023-05-22 DIAGNOSIS — G8929 Other chronic pain: Secondary | ICD-10-CM | POA: Diagnosis not present

## 2023-05-22 DIAGNOSIS — M25562 Pain in left knee: Secondary | ICD-10-CM | POA: Diagnosis not present

## 2023-05-22 DIAGNOSIS — E785 Hyperlipidemia, unspecified: Secondary | ICD-10-CM | POA: Insufficient documentation

## 2023-05-22 MED ORDER — TADALAFIL (PAH) 20 MG PO TABS: 20.0000 mg | ORAL_TABLET | Freq: Every day | ORAL | 11 refills | Status: DC

## 2023-05-22 MED ORDER — GABAPENTIN 600 MG PO TABS: ORAL_TABLET | ORAL | 1 refills | Status: DC

## 2023-05-22 MED ORDER — HYDROCODONE-ACETAMINOPHEN 10-325 MG PO TABS: 1.0000 | ORAL_TABLET | Freq: Three times a day (TID) | ORAL | 0 refills | Status: DC | PRN

## 2023-05-22 MED ORDER — JARDIANCE 10 MG PO TABS: 10.0000 mg | ORAL_TABLET | Freq: Every day | ORAL | 11 refills | Status: DC

## 2023-05-22 NOTE — Progress Notes (Signed)
PCP: Pincus Sanes, MD HF Cardiology: Dr. Shirlee Latch  73 y.o. with history of pulmonary hypertension returns for followup of PH and RV failure.  Patient has a history of rheumatoid arthritis though not currently treated, kyphoscoliosis, prior breast cancer, NSTEMI in 1991 and 1998 thought to be due to vasospasm, and asthma since childhood. She had an echo in 12/23 showing normal LV EF 60-65%, mild RV enlargement with normal RV systolic function, PASP 48 mmHg.  She has joint pain in her hands and has Raynaud's phenomenon.  She is not currently on treatment for RA.  She has had a long history of asthma, sees Dr. Delton Coombes for this. She wears oxygen at night, rarely during the day.   She had LHC/RHC in 12/24, this showed no significant CAD and normal filling pressures but CI was mildly low at 2.1 and there was severe pulmonary arterial hypertension with PVR 10.4 WU.   Echo in 12/24 showed EF 70-75%, moderate LVH, D-shaped septum, mild RV dysfunction with mildly dilated RV, PASP 73 mmHg, mild-moderate MR, IVC dilated.   She is now on Opsumit, just got approved for tadalafil but has not started it. She is using a walker.  No dyspnea walking in the house but gets short of breath walking longer distances.  No dyspnea dressing but does get short of breath bathing. Chronically raises head of bed at night. No palpitations or lightheadedness.   ECG (personally reviewed): NSR, poor RWP, inferior TWIs  Labs (10/24): LDL 56 Labs (11/24): pro-BNP 1389, hgb 13.8, K 4.2, creatinine 0.93 Labs (12/24): K 3.9, creatinine 0.86, anti-SCL 70 negative, anti-centromere negative, RF 396, CCP > 250.   6 minute walk (1/25): 152 m   PMH: 1. Hypothyroidism 2. CAD: NSTEMI thought to be related to vasospasm in 1991 and 1998.  - LHC (12/24): Normal coronaries.  3. HTN: Orthostatic hypotension with amlodipine.  4. Hyperlipidemia 5. Atrial septal aneurysm 6. Asthma: Since childhood, sees Dr. Delton Coombes 7. Breast cancer: S/p lumpectomy,  radiation.  8. Rheumatoid arthritis: Not on DMARD.  9. Raynaud's phenomenon.  10. Pulmonary hypertension: Echo (12/23) with EF 60-65%, mild RV enlargement with normal RV systolic function, severe RAE, PASP 48 mmHg.  - RHC (12/24): mean RA 8, PA 78/25 mean 46, mean PCWP 14, CI 2.1, PVR 10.4 WU, PAPi 6.6.  - Echo (12/24): EF 70-75%, moderate LVH, D-shaped septum, mild RV dysfunction with mildly dilated RV, PASP 73 mmHg, mild-moderate MR, IVC dilated.  11. Kyphoscoliosis 12. Right THR  SH: Widow, lives alone in [place], 2 children locally. Nonsmoker. No ETOH.   Family History  Problem Relation Age of Onset   Heart failure Mother    Pneumonia Mother    Hypertension Mother    Heart disease Mother    Stroke Maternal Grandfather    Hypertension Maternal Grandfather    Heart attack Father    Heart disease Father    Asthma Paternal Uncle        PAT UNCLES   Heart attack Paternal Uncle    ROS: All systems reviewed and negative except as per HPI.   Current Outpatient Medications  Medication Sig Dispense Refill   albuterol (VENTOLIN HFA) 108 (90 Base) MCG/ACT inhaler Inhale 2 puffs into the lungs every 4 (four) hours as needed for wheezing or shortness of breath. 1 each 11   aspirin EC 81 MG tablet Take 1 tablet (81 mg total) by mouth daily. Swallow whole. 90 tablet 3   atorvastatin (LIPITOR) 20 MG tablet Take 1 tablet (  20 mg total) by mouth daily. 90 tablet 3   bisoprolol (ZEBETA) 5 MG tablet TAKE 1 TABLET BY MOUTH DAILY 90 tablet 1   clobetasol cream (TEMOVATE) 0.05 % Apply 1 Application topically as needed.     DULoxetine (CYMBALTA) 30 MG capsule TAKE ONE CAPSULE EVERY DAY IN ADDITION TO 60MG  CAPSULE FOR A TOTAL OF 90MG  DAILY 90 capsule 1   DULoxetine (CYMBALTA) 60 MG capsule TAKE ONE CAPSULE BY MOUTH EVERY DAY TAKE ALONG WITH 30MG  CAPSULE. TOTAL DAILY DOSE is 90MG . 90 capsule 1   famotidine (PEPCID) 20 MG tablet TAKE ONE TABLET EVERY DAY AFTER SUPPER 90 tablet 2   fluticasone  furoate-vilanterol (BREO ELLIPTA) 100-25 MCG/ACT AEPB INHALE 1 PUFF INTO THE LUNGS DAILY 60 each 11   furosemide (LASIX) 20 MG tablet Take 2 tablets (40 mg total) by mouth daily. 180 tablet 3   gabapentin (NEURONTIN) 600 MG tablet TAKE ONE TABLET BY MOUTH EVERY DAY AT BEDTIME 90 tablet 1   HYDROcodone-acetaminophen (NORCO) 10-325 MG tablet Take 1 tablet by mouth 3 (three) times daily as needed. 90 tablet 0   ipratropium-albuterol (DUONEB) 0.5-2.5 (3) MG/3ML SOLN Take 3 mLs by nebulization every 6 (six) hours as needed (wheezing or SOB). During exacerbation 120 mL 3   irbesartan (AVAPRO) 75 MG tablet TAKE 1 TABLET(75 MG) BY MOUTH DAILY 90 tablet 1   JARDIANCE 10 MG TABS tablet Take 1 tablet (10 mg total) by mouth daily before breakfast. 30 tablet 11   levothyroxine (SYNTHROID) 100 MCG tablet TAKE 1 TABLET(100 MCG) BY MOUTH DAILY 90 tablet 3   macitentan (OPSUMIT) 10 MG tablet Take 1 tablet (10 mg total) by mouth daily.     tadalafil, PAH, (ADCIRCA) 20 MG tablet Take 1 tablet (20 mg total) by mouth daily. 30 tablet 11   No current facility-administered medications for this encounter.   BP 130/80   Pulse 89   Wt 59.7 kg (131 lb 9.6 oz)   SpO2 95%   BMI 25.70 kg/m  General: NAD, kyphotic Neck: No JVD, no thyromegaly or thyroid nodule.  Lungs: Clear to auscultation bilaterally with normal respiratory effort. CV: Nondisplaced PMI.  Heart regular S1/S2, no S3/S4, no murmur.  No peripheral edema.  No carotid bruit.  Normal pedal pulses.  Abdomen: Soft, nontender, no hepatosplenomegaly, no distention.  Skin: Intact without lesions or rashes.  Neurologic: Alert and oriented x 3.  Psych: Normal affect. Extremities: No clubbing or cyanosis.  HEENT: Normal.   Assessment/Plan: 1. Pulmonary hypertension/RV failure: Echo in 12/24 with  EF 70-75%, moderate LVH, D-shaped septum, mild RV dysfunction with mildly dilated RV, PASP 73 mmHg, mild-moderate MR, IVC dilated. RHC in 12/24 with normal filling  pressures, CI 2.1, and severe PAH with mean PA pressure 46 and PVR 10.4 WU.  CXR in 8/24 showed no active disease.  I suspect group 1 PH possibly related to rheumatologic disease. She has been given the diagnosis of RA in the past though not currently being treated, and she has a h/o Raynaud's phenomenon. Rheumatologic labs suggest RA with elevated RF and CCP.  Need to rule out group 3 PH and CTEPH but think less likely.  NYHA class III symptoms.  She is not volume overloaded on exam today.   - 6 minute walk done today.  - I will arrange for high resolution CT chest to look for ILD/lung parenchymal disease and will also get PFTs => these have been ordered but still need to be scheduled.  - I will  arrange for V/Q scan to look for evidence of chronic PEs =>ordered but still needs to be scheduled.  - Continue Opsumit 10 mg daily.  - Start tadalafil 20 mg daily and titrate up to 40 eventually.   - Next step will be Uptravi initiation.  - Continue Lasix 40 mg daily, BMET/BNP today - Resume Jardiance 10 mg daily.  2. Rheumatoid arthritis: Patient carries this diagnosis and has hand changes consistent with RA.  She is not on disease-specific meds currently.  She also has Raynaud's phenomenon.  RF and CCP elevated. - She has a rheumatology appt.  3. CAD: Patient had NSTEMIs in 1991 and 1998, per her report thought to be due to vasospasm.  LHC in 12/24 showed no significant coronary disease.  - Continue ASA 81  - Continue statin.  4. Hyperlipidemia: She is on atorvastatin, good lipids in 10/24.  5. Kyphoscoliosis: This likely contributed to her dyspnea via chest wall restriction.  - To get PFTs as above.  6. HTN: BP controlled.   Followup 3 wks HF pharmacist to increase tadalafil, see me in 6 wks.   Marca Ancona 05/22/2023

## 2023-05-22 NOTE — Progress Notes (Signed)
6 Min Walk Test Completed  Pt ambulated 500 ft (152.4 m) O2 Sat ranged 92%-88% on 2L oxygen HR ranged 94-120

## 2023-05-22 NOTE — Patient Instructions (Signed)
RESTART Tadalafil 20 mg daily.  RESTART Jardiance 10 mg daily.  Please follow up with our heart failure pharmacist in 3 weeks.  Your physician recommends that you schedule a follow-up appointment in: 6 weeks.  If you have any questions or concerns before your next appointment please send [place] a message through [place] or call our office at [phone].    TO LEAVE A MESSAGE FOR THE NURSE SELECT OPTION 2, PLEASE LEAVE A MESSAGE INCLUDING: YOUR NAME DATE OF BIRTH CALL BACK NUMBER REASON FOR CALL**this is important as we prioritize the call backs  YOU WILL RECEIVE A CALL BACK THE SAME DAY AS LONG AS YOU CALL BEFORE 4:00 PM  At the [facility], you and your health needs are our priority. As part of our continuing mission to provide you with exceptional heart care, we have created designated Provider Care Teams. These Care Teams include your primary Cardiologist (physician) and Advanced Practice Providers (APPs- Physician Assistants and Nurse Practitioners) who all work together to provide you with the care you need, when you need it.   You may see any of the following providers on your designated Care Team at your next follow up: Dr Arvilla Meres Dr Marca Ancona Dr. Dorthula Nettles Dr. Clearnce Hasten Amy Filbert Schilder, NP Robbie Lis, [place] [facility] [place], [place] Brynda Peon, NP [place] Lee, NP Karle Plumber, PharmD   Please be sure to bring in all your medications bottles to every appointment.    Thank you for choosing [facility]

## 2023-05-22 NOTE — Progress Notes (Incomplete)
PCP: Pincus Sanes, MD HF Cardiology: Dr. Shirlee Latch  73 y.o. with history of pulmonary hypertension returns for followup of PH and RV failure.  Patient has a history of rheumatoid arthritis though not currently treated, kyphoscoliosis, prior breast cancer, NSTEMI in 1991 and 1998 thought to be due to vasospasm, and asthma since childhood. She had an echo in 12/23 showing normal LV EF 60-65%, mild RV enlargement with normal RV systolic function, PASP 48 mmHg.  She has joint pain in her hands and has Raynaud's phenomenon.  She is not currently on treatment for RA.  She has had a long history of asthma, sees Dr. Delton Coombes for this. She wears oxygen at night, rarely during the day.   She had LHC/RHC in 12/24, this showed no significant CAD and normal filling pressures but CI was mildly low at 2.1 and there was severe pulmonary arterial hypertension with PVR 10.4 WU.   Echo in 12/24 showed EF 70-75%, moderate LVH, D-shaped septum, mild RV dysfunction with mildly dilated RV, PASP 73 mmHg, mild-moderate MR, IVC dilated.   She is now on Opsumit, just got approved for tadalafil but has not started it. She is using a walker.  No dyspnea walking in the house but gets short of breath walking longer distances.  No dyspnea dressing but does get short of breath bathing. Chronically raises head of bed at night. No palpitations or lightheadedness.   ECG (personally reviewed): NSR, poor RWP, inferior TWIs  Labs (10/24): LDL 56 Labs (11/24): pro-BNP 1389, hgb 13.8, K 4.2, creatinine 0.93 Labs (12/24): K 3.9, creatinine 0.86, anti-SCL 70 negative, anti-centromere negative, RF 396, CCP > 250.   6 minute walk (1/25): 152 m   PMH: 1. Hypothyroidism 2. CAD: NSTEMI thought to be related to vasospasm in 1991 and 1998.  - LHC (12/24): Normal coronaries.  3. HTN: Orthostatic hypotension with amlodipine.  4. Hyperlipidemia 5. Atrial septal aneurysm 6. Asthma: Since childhood, sees Dr. Delton Coombes 7. Breast cancer: S/p lumpectomy,  radiation.  8. Rheumatoid arthritis: Not on DMARD.  9. Raynaud's phenomenon.  10. Pulmonary hypertension: Echo (12/23) with EF 60-65%, mild RV enlargement with normal RV systolic function, severe RAE, PASP 48 mmHg.  - RHC (12/24): mean RA 8, PA 78/25 mean 46, mean PCWP 14, CI 2.1, PVR 10.4 WU, PAPi 6.6.  - Echo (12/24): EF 70-75%, moderate LVH, D-shaped septum, mild RV dysfunction with mildly dilated RV, PASP 73 mmHg, mild-moderate MR, IVC dilated.  11. Kyphoscoliosis 12. Right THR  SH: Widow, lives alone in [place], 2 children locally. Nonsmoker. No ETOH.   Family History  Problem Relation Age of Onset  . Heart failure Mother   . Pneumonia Mother   . Hypertension Mother   . Heart disease Mother   . Stroke Maternal Grandfather   . Hypertension Maternal Grandfather   . Heart attack Father   . Heart disease Father   . Asthma Paternal Uncle        PAT UNCLES  . Heart attack Paternal Uncle    ROS: All systems reviewed and negative except as per HPI.   Current Outpatient Medications  Medication Sig Dispense Refill  . albuterol (VENTOLIN HFA) 108 (90 Base) MCG/ACT inhaler Inhale 2 puffs into the lungs every 4 (four) hours as needed for wheezing or shortness of breath. 1 each 11  . aspirin EC 81 MG tablet Take 1 tablet (81 mg total) by mouth daily. Swallow whole. 90 tablet 3  . atorvastatin (LIPITOR) 20 MG tablet Take 1 tablet (  20 mg total) by mouth daily. 90 tablet 3  . bisoprolol (ZEBETA) 5 MG tablet TAKE 1 TABLET BY MOUTH DAILY 90 tablet 1  . clobetasol cream (TEMOVATE) 0.05 % Apply 1 Application topically as needed.    . DULoxetine (CYMBALTA) 30 MG capsule TAKE ONE CAPSULE EVERY DAY IN ADDITION TO 60MG  CAPSULE FOR A TOTAL OF 90MG  DAILY 90 capsule 1  . DULoxetine (CYMBALTA) 60 MG capsule TAKE ONE CAPSULE BY MOUTH EVERY DAY TAKE ALONG WITH 30MG  CAPSULE. TOTAL DAILY DOSE is 90MG . 90 capsule 1  . famotidine (PEPCID) 20 MG tablet TAKE ONE TABLET EVERY DAY AFTER SUPPER 90 tablet 2  .  fluticasone furoate-vilanterol (BREO ELLIPTA) 100-25 MCG/ACT AEPB INHALE 1 PUFF INTO THE LUNGS DAILY 60 each 11  . furosemide (LASIX) 20 MG tablet Take 2 tablets (40 mg total) by mouth daily. 180 tablet 3  . gabapentin (NEURONTIN) 600 MG tablet TAKE ONE TABLET BY MOUTH EVERY DAY AT BEDTIME 90 tablet 1  . HYDROcodone-acetaminophen (NORCO) 10-325 MG tablet Take 1 tablet by mouth 3 (three) times daily as needed. 90 tablet 0  . ipratropium-albuterol (DUONEB) 0.5-2.5 (3) MG/3ML SOLN Take 3 mLs by nebulization every 6 (six) hours as needed (wheezing or SOB). During exacerbation 120 mL 3  . irbesartan (AVAPRO) 75 MG tablet TAKE 1 TABLET(75 MG) BY MOUTH DAILY 90 tablet 1  . JARDIANCE 10 MG TABS tablet Take 1 tablet (10 mg total) by mouth daily before breakfast. 30 tablet 11  . levothyroxine (SYNTHROID) 100 MCG tablet TAKE 1 TABLET(100 MCG) BY MOUTH DAILY 90 tablet 3  . macitentan (OPSUMIT) 10 MG tablet Take 1 tablet (10 mg total) by mouth daily.    . tadalafil, PAH, (ADCIRCA) 20 MG tablet Take 1 tablet (20 mg total) by mouth daily. 30 tablet 11   No current facility-administered medications for this encounter.   BP 130/80   Pulse 89   Wt 59.7 kg (131 lb 9.6 oz)   SpO2 95%   BMI 25.70 kg/m  General: NAD, kyphotic Neck: No JVD, no thyromegaly or thyroid nodule.  Lungs: Clear to auscultation bilaterally with normal respiratory effort. CV: Nondisplaced PMI.  Heart regular S1/S2, no S3/S4, no murmur.  No peripheral edema.  No carotid bruit.  Normal pedal pulses.  Abdomen: Soft, nontender, no hepatosplenomegaly, no distention.  Skin: Intact without lesions or rashes.  Neurologic: Alert and oriented x 3.  Psych: Normal affect. Extremities: No clubbing or cyanosis.  HEENT: Normal.   Assessment/Plan: 1. Pulmonary hypertension/RV failure: Echo in 12/24 with  EF 70-75%, moderate LVH, D-shaped septum, mild RV dysfunction with mildly dilated RV, PASP 73 mmHg, mild-moderate MR, IVC dilated. RHC in 12/24  with normal filling pressures, CI 2.1, and severe PAH with mean PA pressure 46 and PVR 10.4 WU.  CXR in 8/24 showed no active disease.  I suspect group 1 PH possibly related to rheumatologic disease. She has been given the diagnosis of RA in the past though not currently being treated, and she has a h/o Raynaud's phenomenon. Rheumatologic labs suggest RA with elevated RF and CCP.  Need to rule out group 3 PH and CTEPH but think less likely.  NYHA class III symptoms.  She is not volume overloaded on exam today.   - I will arrange for high resolution CT chest to look for ILD/lung parenchymal disease and will also get PFTs.  - I will arrange for V/Q scan to look for evidence of chronic PEs.  - Send rheumatologic serologies: RF, ANA,  SCL-70, anti-centromere, anti-CCP.  - Send BNP - She needs repeat echo , I will order.  - She is unable to do 6 minute walk today as she did not bring her walker.  - I will start her on Opsumit 10 mg daily.  - She will followup in 2-3 wks in HF pharmacy clinic to start tadalafil.  - Increase Lasix to 40 mg daily and add KCl 10 daily.  2. Rheumatoid arthritis: Patient carries this diagnosis and has hand changes consistent with RA.  She is not on disease-specific meds currently.  She also has Raynaud's phenomenon.   - Refer to rheumatology.  3. CAD: Patient had NSTEMIs in 1991 and 1998, per her report thought to be due to vasospasm.  LHC in 12/24 showed on significant coronary disease. K 4. Hyperlipidemia: She is on atorvastatin, good lipids in 10/24.  5. Kyphoscoliosis: This likely contributed to her dyspnea via chest wall restriction.  - To get PFTs as above.  6. HTN: BP mildly elevated today.  Will continue current regimen of bisoprolol and irbesartan for now.  She did not tolerate amlodipine well due to orthostatic hypotension.   Followup 2-3 wks HF pharmacist for med titration, see me in 6 wks.   Marca Ancona 05/22/2023

## 2023-05-22 NOTE — Progress Notes (Unsigned)
Subjective:    Patient ID: Hannah Scott, female    DOB: January 08, 1951, 73 y.o.   MRN: [id number]  HPI: Hannah Scott is a 73 y.o. female who returns for follow up appointment for chronic pain and medication refill. She states her pain is located in her  lower back, Right hip and left knee pain. She rates her pain 8. Her current exercise regime is walking with her walker.   [name] Morphine equivalent is 30.00 MME.  Last Oral Swab was Performed on 01/03/2023, it was consistent.     Pain Inventory Average Pain 8 Pain Right Now 8 My pain is constant  In the last 24 hours, has pain interfered with the following? General activity 10 Relation with others 10 Enjoyment of life 10 What TIME of day is your pain at its worst? morning , daytime, evening, and night Sleep (in general) Poor  Pain is worse with: walking, bending, standing, and some activites Pain improves with: rest and medication Relief from Meds: 5  Family History  Problem Relation Age of Onset   Heart failure Mother    Pneumonia Mother    Hypertension Mother    Heart disease Mother    Stroke Maternal Grandfather    Hypertension Maternal Grandfather    Heart attack Father    Heart disease Father    Asthma Paternal Uncle        PAT UNCLES   Heart attack Paternal Uncle    Social History   Socioeconomic History   Marital status: Widowed    Spouse name: Not on file   Number of children: Not on file   Years of education: Not on file   Highest education level: Not on file  Occupational History   Not on file  Tobacco Use   Smoking status: Never    Passive exposure: Never   Smokeless tobacco: Never  Vaping Use   Vaping status: Never Used  Substance and Sexual Activity   Alcohol use: No    Alcohol/week: 0.0 standard drinks of alcohol   Drug use: No   Sexual activity: Not Currently    Partners: Male    Birth control/protection: Surgical  Other Topics Concern   Not on file  Social History Narrative   Widowed in  2015   2 sons 1 lives in the area the other is in close contact    1 grandchild   Never smoker no drug use no alcohol   Social Drivers of Corporate investment banker Strain: Low Risk  (01/11/2022)   Overall Financial Resource Strain (CARDIA)    Difficulty of Paying Living Expenses: Not hard at all  Food Insecurity: No Food Insecurity (01/11/2022)   Hunger Vital Sign    Worried About Running Out of Food in the Last Year: Never true    Ran Out of Food in the Last Year: Never true  Transportation Needs: No Transportation Needs (01/11/2022)   PRAPARE - Administrator, Civil Service (Medical): No    Lack of Transportation (Non-Medical): No  Physical Activity: Inactive (01/11/2022)   Exercise Vital Sign    Days of Exercise per Week: 0 days    Minutes of Exercise per Session: 0 min  Stress: No Stress Concern Present (01/11/2022)   [employer] of Occupational Health - Occupational Stress Questionnaire    Feeling of Stress : Not at all  Social Connections: Socially Integrated (01/11/2022)   Social Connection and Isolation Panel [NHANES]    Frequency  of Communication with Friends and Family: More than three times a week    Frequency of Social Gatherings with Friends and Family: More than three times a week    Attends Religious Services: More than 4 times per year    Active Member of Clubs or Organizations: Yes    Attends Engineer, structural: More than 4 times per year    Marital Status: Married   Past Surgical History:  Procedure Laterality Date   ANKLE SURGERY Left    ligament   APPENDECTOMY  1987   AT [facility]   BACK SURGERY     Fusion   BREAST LUMPECTOMY  2003   radiation on right   BREAST LUMPECTOMY WITH RADIOACTIVE SEED LOCALIZATION Left 02/12/2016   Procedure: LEFT BREAST LUMPECTOMY WITH RADIOACTIVE SEED LOCALIZATION;  Surgeon: Glenna Fellows, MD;  Location: [facility];  Service: General;  Laterality: Left;   CARDIOVASCULAR STRESS TEST   09/07/05   Nuclear, was negative   CATARACT EXTRACTION Bilateral 01,03   COLONOSCOPY WITH PROPOFOL N/A 05/04/2021   Procedure: COLONOSCOPY WITH PROPOFOL;  Surgeon: Iva Boop, MD;  Location: [facility];  Service: Endoscopy;  Laterality: N/A;   ESOPHAGOGASTRODUODENOSCOPY (EGD) WITH PROPOFOL N/A 05/04/2021   Procedure: ESOPHAGOGASTRODUODENOSCOPY (EGD) WITH PROPOFOL;  Surgeon: Iva Boop, MD;  Location: [facility];  Service: Endoscopy;  Laterality: N/A;   OOPHORECTOMY     LSO -RSO   PELVIC LAPAROSCOPY  1989   RSO, LYSIS OF ADHESIONS   POLYPECTOMY  05/04/2021   Procedure: POLYPECTOMY;  Surgeon: Iva Boop, MD;  Location: [facility];  Service: Endoscopy;;   RIGHT/LEFT HEART CATH AND CORONARY ANGIOGRAPHY N/A 03/28/2023   Procedure: RIGHT/LEFT HEART CATH AND CORONARY ANGIOGRAPHY;  Surgeon: Laurey Morale, MD;  Location: [facility];  Service: Cardiovascular;  Laterality: N/A;   TOTAL ABDOMINAL HYSTERECTOMY  1987   LSO, APPENDECTOMY   TOTAL HIP ARTHROPLASTY Right 10/28/2013   dr Ophelia Charter   TOTAL HIP ARTHROPLASTY Right 10/28/2013   Procedure: TOTAL HIP ARTHROPLASTY ANTERIOR APPROACH;  Surgeon: Eldred Manges, MD;  Location: [facility];  Service: Orthopedics;  Laterality: Right;  Right Total Hip Arthroplasty, Direct Anterior Approach   Past Surgical History:  Procedure Laterality Date   ANKLE SURGERY Left    ligament   APPENDECTOMY  1987   AT [facility]   BACK SURGERY     Fusion   BREAST LUMPECTOMY  2003   radiation on right   BREAST LUMPECTOMY WITH RADIOACTIVE SEED LOCALIZATION Left 02/12/2016   Procedure: LEFT BREAST LUMPECTOMY WITH RADIOACTIVE SEED LOCALIZATION;  Surgeon: Glenna Fellows, MD;  Location: [facility];  Service: General;  Laterality: Left;   CARDIOVASCULAR STRESS TEST  09/07/05   Nuclear, was negative   CATARACT EXTRACTION Bilateral 01,03   COLONOSCOPY WITH PROPOFOL N/A 05/04/2021   Procedure: COLONOSCOPY WITH PROPOFOL;  Surgeon: Iva Boop, MD;   Location: [facility];  Service: Endoscopy;  Laterality: N/A;   ESOPHAGOGASTRODUODENOSCOPY (EGD) WITH PROPOFOL N/A 05/04/2021   Procedure: ESOPHAGOGASTRODUODENOSCOPY (EGD) WITH PROPOFOL;  Surgeon: Iva Boop, MD;  Location: [facility];  Service: Endoscopy;  Laterality: N/A;   OOPHORECTOMY     LSO -RSO   PELVIC LAPAROSCOPY  1989   RSO, LYSIS OF ADHESIONS   POLYPECTOMY  05/04/2021   Procedure: POLYPECTOMY;  Surgeon: Iva Boop, MD;  Location: [facility];  Service: Endoscopy;;   RIGHT/LEFT HEART CATH AND CORONARY ANGIOGRAPHY N/A 03/28/2023   Procedure: RIGHT/LEFT HEART CATH AND CORONARY ANGIOGRAPHY;  Surgeon: Laurey Morale, MD;  Location: [facility] INVASIVE CV LAB;  Service: Cardiovascular;  Laterality: N/A;   TOTAL ABDOMINAL HYSTERECTOMY  1987   LSO, APPENDECTOMY   TOTAL HIP ARTHROPLASTY Right 10/28/2013   dr Ophelia Charter   TOTAL HIP ARTHROPLASTY Right 10/28/2013   Procedure: TOTAL HIP ARTHROPLASTY ANTERIOR APPROACH;  Surgeon: Eldred Manges, MD;  Location: [facility];  Service: Orthopedics;  Laterality: Right;  Right Total Hip Arthroplasty, Direct Anterior Approach   Past Medical History:  Diagnosis Date   Anemia    hx   Arthritis    Asthma    PFTs, February, 2011, moderate obstructive disease with response to bronchodilators, normal lung volumes, moderate reduction in diffusing capacity   Atrial septal aneurysm    Echo, 2008-not noted on 13 echo   CAD (coronary artery disease)    90% distal LAD in the past  /   nuclear, 2008, no ischemia, ejection fraction 70%   Cancer (HCC) 2002   DUCTAL CIS--S/P LUMPECTOMY, RADIATION AND 6 WEEKS OF TAMOXIFEN   D-dimer, elevated    January, 2014   Depression    Ejection fraction    EF 60%, echo, October, 2008   Elevated CPK    January, 2014   Endometriosis 1989   RIGHT TUBE   Endometriosis 1987   LEFT TUBE/OVARY W FOCAL IN-SITU ENDOMETRIAL ADENOCARCINOMA   GERD (gastroesophageal reflux disease)    occ   H/O hiatal hernia    ?   Hyperlipidemia     Hypertension    Hypothyroidism    Patient has had in the past that she does not need treatment   Kyphoscoliosis    Obstructive airway disease (HCC)    Pinched nerve    lower back   Shortness of breath    Echo 2/22: EF 60-65, no RWMA, mild LVH, GR 1  DD, GLS -24%, normal RVSF, mild MR, trivial AI, borderline dilation of aortic root (39 mm)   UTI (lower urinary tract infection)    BP 120/65   Pulse (!) 103   Ht 5' (1.524 m)   Wt 130 lb 12.8 oz (59.3 kg)   SpO2 92%   BMI 25.55 kg/m   Opioid Risk Score:   Fall Risk Score:  `1  Depression screen [facility] 2/9     04/14/2023   10:53 AM 03/16/2023    2:16 PM 01/03/2023    2:31 PM 11/22/2022    3:06 PM 10/21/2022    2:10 PM 09/13/2022    2:51 PM 08/15/2022   11:52 AM  Depression screen PHQ 2/9  Decreased Interest 0 0 1 0 1 1 1   Down, Depressed, Hopeless 0 0 1 0 1 1 1   PHQ - 2 Score 0 0 2 0 2 2 2     Review of Systems  Musculoskeletal:  Positive for back pain.  All other systems reviewed and are negative.      Objective:   Physical Exam Vitals and nursing note reviewed.  Constitutional:      Appearance: Normal appearance.  Cardiovascular:     Rate and Rhythm: Normal rate and regular rhythm.     Pulses: Normal pulses.     Heart sounds: Normal heart sounds.  Pulmonary:     Effort: Pulmonary effort is normal.     Breath sounds: Normal breath sounds.  Musculoskeletal:     Comments: Normal Muscle Bulk and Muscle Testing Reveals:  Upper Extremities: Full ROM and Muscle Strength 5/5 Lumbar paraspinal Tenderness: L-4-L-5 Mainly Right Side  Right Greater Trochanter Tenderness Lower Extremities: Full ROM and Muscle Strength 5/5 Left Lower extremity Flexion Produces Pain into her Left Patella Arises from Table slowly using walker for support Antalgic  Gait     Skin:    General: Skin is warm and dry.  Neurological:     Mental Status: She is alert and oriented to person, place, and time.  Psychiatric:        Mood and Affect:  Mood normal.        Behavior: Behavior normal.          Assessment & Plan:  1. Lumbar post laminectomy syndrome with severe kyphoscoliosis thoracolumbar spine, s/p lumbar fusion/  05/22/2023 Continue current medication regimen. Refilled:  Hydrocodone 10/325mg  one tablet every 8 hours #90. We will continue the opioid monitoring program, this consists of regular clinic visits, examinations, urine drug screen, pill counts as well as use of [place] Controlled Substance Reporting system. A 12 month History has been reviewed on the [place] Controlled Substance Reporting System on 05/22/2023 2. Right Hip OA: S/P Right Hip Replacement 10/28/2013. 05/22/2023 3.Depression: Continue Cymbalta. Has Family Support and Friends.  Counseling with Renato Gails. 05/22/2023 4. Bilateral Hip Pain/ Right Greater Trochanteric Tenderness: Continue to Monitor. Continue with Ice and Heat Therapy. 05/22/2023 5. Poor Appetite/ Frail: No complaints today. Ms. Dils states PCP following. Continue to monitor. 05/22/2023 6. Chronic Left  Knee Pain: Continue HEP as Tolerated. Continue to Monitor. 05/22/2023 7. Chronic Right  shoulder pain:  No complaints today.Continue HEP as tolerated. Continue to Monitor. 05/22/2023   F/U in 1 month

## 2023-05-23 ENCOUNTER — Encounter: Payer: Medicare Other | Admitting: Registered Nurse

## 2023-05-24 ENCOUNTER — Encounter: Payer: Self-pay | Admitting: Registered Nurse

## 2023-05-24 DIAGNOSIS — R0609 Other forms of dyspnea: Secondary | ICD-10-CM | POA: Diagnosis not present

## 2023-05-24 DIAGNOSIS — J4540 Moderate persistent asthma, uncomplicated: Secondary | ICD-10-CM | POA: Diagnosis not present

## 2023-06-07 ENCOUNTER — Telehealth (HOSPITAL_COMMUNITY): Payer: Self-pay

## 2023-06-07 ENCOUNTER — Other Ambulatory Visit (HOSPITAL_COMMUNITY): Payer: Self-pay

## 2023-06-07 DIAGNOSIS — I272 Pulmonary hypertension, unspecified: Secondary | ICD-10-CM

## 2023-06-07 NOTE — Telephone Encounter (Signed)
Approved for cpt code 71250(High Res Chest CT) [id number]  Approved for cpt code 11914 (VQ Scan) [id number]  Both are valid from 06/07/23-07/06/2023

## 2023-06-11 NOTE — Progress Notes (Incomplete)
 ***In Progress***    Advanced Heart Failure Clinic Note   PCP: Pincus Sanes, MD HF Cardiology: Marca Ancona, MD   HPI:  47 YOF with PMH of pulmonary hypertension returns for followup of PH and RV failure.  Patient has a history of rheumatoid arthritis though not currently treated, kyphoscoliosis, prior breast cancer, NSTEMI in 1991 and 1998 thought to be due to vasospasm, and asthma since childhood. She had an ECHO in 03/2022 showing normal LVEF 60-65%, mild RV enlargement with normal RV systolic function, PASP 48 mmHg.  She has joint pain in her hands and has Raynaud's phenomenon.  *** She is not currently on treatment for RA.  She has had a long history of asthma, sees Dr. Delton Coombes for this. *** She wears oxygen at night, rarely during the day.    She had LHC/RHC in 03/2023, this showed no significant CAD and normal filling pressures but CI was mildly low at 2.1 and there was severe pulmonary arterial hypertension with PVR 10.4 WU.  ECHO in 03/2023 showed LVEF 70-75%, moderate LVH, D-shaped septum, mild RV dysfunction with mildly dilated RV, PASP 73 mmHg, mild-moderate MR, IVC dilated.    She returned to HF clinic for follow-up on 05/22/2023. *** For PAH, she had been taking Opsumit and recently been approved for tadalafil, but she had not started is. She had been using a walker to ambulate. She reported no dyspnea when walking in the house room-to-room, but that she gets short of breath walking longer distances. At this visit, ECG showed NSR, poor RWP, inferior TWIs. Her 6 minute walk test was 152 m.  Today she returns to HF clinic for pharmacist medication titration. At last visit with MD she was started on Jardiance 10 mg daily.   (Insert dot phrase ***)  Has the patient been experiencing any side effects to the medications prescribed?  {YES NO:22349}  Does the patient have any problems obtaining medications due to transportation or finances?    BCBS Medicare. Has [employer] grant for  pulmonary hypertension medications. Needs to call [facility] to schedule delivery of Opsumit.  Understanding of regimen: {excellent/good/fair/poor:19665} Understanding of indications: {excellent/good/fair/poor:19665} Potential of compliance: {excellent/good/fair/poor:19665} Patient understands to avoid NSAIDs. Patient understands to avoid decongestants.    Pertinent Lab Values: Labs (01/2023): LDL 56 Labs (02/2023): pro-BNP 1389, Hgb 13.8, K 4.2, creatinine 0.93 Labs (03/2023): K 3.9, Scr 0.86, Na 136, BNP 675.1, anti-SCL 70 negative, anti-centromere negative, RF 396, anti-CCP >250   Vital Signs: Weight: *** (last clinic weight: 130 lbs) Blood pressure: *** (last BP: 120/65 mmHg) Heart rate: *** (last HR: 103 bpm)  Brief A&P: Vitals: Labs: Scr stable  >Bmet + BNP today  1a. Inc tadalfil 20 >40 mg; pending tolerance (HA, flushing, N/V) and BP  1b. If hypervolemic, assess lasix use, may need more  F/u 3 weeks (3/10 1:00 or 3:00); sees HF MD 3/17   Assessment/Plan: 1. Pulmonary hypertension/RV failure: Echo in 03/2023 with  EF 70-75%, moderate LVH, D-shaped septum, mild RV dysfunction with mildly dilated RV, PASP 73 mmHg, mild-moderate MR, IVC dilated. RHC in 03/2023 with normal filling pressures, CI 2.1, and severe PAH with mean PA pressure 46 and PVR 10.4 WU.  CXR in 11/2022 showed no active disease. Suspect group 1 PH possibly related to rheumatologic disease. She has been given the diagnosis of RA in the past though not currently being treated, and she has a h/o Raynaud's phenomenon. ***Rheumatologic labs suggest RA with elevated RF and anti-CCP.  Need to  rule out group 3 PH and CTEPH but think less likely. Last 6-minute walk test 152 m (05/22/2023). ***NYHA class III symptoms.  ***She is not volume overloaded on exam today. - ***High resolution CT chest to look for ILD/lung parenchymal disease is ordered but not scheduled - *** PFTs scheduled for 07/26/2023 - V/Q scan to look for  evidence of chronic PEs is ordered but not scheduled.  - Continue Opsumit 10 mg daily.  - Continue tadalafil 20 mg daily ***and titrate up to 40 eventually.   - *** Next step will be Uptravi initiation.  - Continue Lasix 40 mg daily, ***BMET/BNP today - Continue Jardiance 10 mg daily.  2. Rheumatoid arthritis: Patient carries this diagnosis and has hand changes consistent with RA.  She is not on disease-specific meds currently.  She also has Raynaud's phenomenon.  RF and CCP elevated. - She has a rheumatology appt scheduled for 06/15/2023 3. CAD: Patient had NSTEMIs in 1991 and 1998, per her report thought to be due to vasospasm.  LHC in 03/2023 showed no significant coronary disease.  - Continue ASA 81 mg daily - Continue atorvastatin 20 mg daily 4. Hyperlipidemia: She is on atorvastatin, good lipids in 10/24.  5. Kyphoscoliosis: This likely contributed to her dyspnea via chest wall restriction.  - To get PFTs as above.  6. HTN: BP controlled.    *** Followup 3 wks HF pharmacist to increase tadalafil, see me in 6 wks.      Karle Plumber, PharmD, BCPS, BCCP, CPP Heart Failure Clinic Pharmacist [phone]

## 2023-06-12 ENCOUNTER — Ambulatory Visit (HOSPITAL_COMMUNITY)
Admission: RE | Admit: 2023-06-12 | Discharge: 2023-06-12 | Disposition: A | Discharge status: Home / Self Care | Payer: Medicare Other | Source: Ambulatory Visit | Attending: Cardiology | Admitting: Cardiology

## 2023-06-12 VITALS — BP 124/72 | HR 104

## 2023-06-12 DIAGNOSIS — E785 Hyperlipidemia, unspecified: Secondary | ICD-10-CM | POA: Diagnosis not present

## 2023-06-12 DIAGNOSIS — I7300 Raynaud's syndrome without gangrene: Secondary | ICD-10-CM | POA: Diagnosis not present

## 2023-06-12 DIAGNOSIS — I272 Pulmonary hypertension, unspecified: Secondary | ICD-10-CM | POA: Diagnosis not present

## 2023-06-12 DIAGNOSIS — R0609 Other forms of dyspnea: Secondary | ICD-10-CM | POA: Diagnosis not present

## 2023-06-12 DIAGNOSIS — I252 Old myocardial infarction: Secondary | ICD-10-CM | POA: Insufficient documentation

## 2023-06-12 DIAGNOSIS — I10 Essential (primary) hypertension: Secondary | ICD-10-CM | POA: Insufficient documentation

## 2023-06-12 DIAGNOSIS — R42 Dizziness and giddiness: Secondary | ICD-10-CM | POA: Diagnosis not present

## 2023-06-12 DIAGNOSIS — M419 Scoliosis, unspecified: Secondary | ICD-10-CM | POA: Insufficient documentation

## 2023-06-12 DIAGNOSIS — Z7982 Long term (current) use of aspirin: Secondary | ICD-10-CM | POA: Diagnosis not present

## 2023-06-12 DIAGNOSIS — Z79899 Other long term (current) drug therapy: Secondary | ICD-10-CM | POA: Insufficient documentation

## 2023-06-12 DIAGNOSIS — I2510 Atherosclerotic heart disease of native coronary artery without angina pectoris: Secondary | ICD-10-CM | POA: Insufficient documentation

## 2023-06-12 NOTE — Progress Notes (Signed)
 Advanced Heart Failure Clinic Note   PCP: Pincus Sanes, MD HF Cardiology: Marca Ancona, MD   HPI:  10 YOF with PMH of pulmonary hypertension, RV failure, rheumatoid arthritis (not currently treated), kyphoscoliosis, prior breast cancer, NSTEMI (1991, 1998) due to vasospasm, and asthma since childhood. She had an ECHO in 03/2022 showing normal LVEF 60-65%, mild RV enlargement with normal RV systolic function, PASP 48 mmHg.     She had LHC/RHC in 03/2023, this showed no significant CAD and normal filling pressures but CI was mildly low at 2.1 and there was severe pulmonary arterial hypertension with PVR 10.4 WU.  ECHO in 03/2023 showed LVEF 70-75%, moderate LVH, D-shaped septum, mild RV dysfunction with mildly dilated RV, PASP 73 mmHg, mild-moderate MR, IVC dilated.    She returned to HF clinic for follow-up on 05/22/2023. For PAH, she had been taking Opsumit and recently been approved for tadalafil, but she had not started taking it. She had been using a walker to ambulate. She reported no dyspnea when walking in the house room-to-room, but that she gets short of breath walking longer distances. She reported not only periodically requiring supplemental oxygen at night and infrequently during the day. At this visit, ECG showed NSR, poor RWP, inferior TWIs. Her 6 minute walk test was 152 m.  Today she returns to HF clinic for pharmacist medication titration. At last visit with MD she was started on Jardiance 10 mg daily and tadalafil 20 mg daily. States she is feeling overall unwell but with no changes since last visit. Her breathing has remained unchanged with DOE for the same tasks as previously. She reports getting lightheaded occasionally while walking but does not notice it when going from sitting to standing. She does feel that the lightheadedness is worse from last visit. Denies chest pain or palpitations. She is still only using oxygen overnight periodically and states this is unchanged from  last visit. She has no LEE. Denies PND or orthopnea. Appetite is okay. States she has no issues with getting her medications from the pharmacy.   Has the patient been experiencing any side effects to the medications prescribed?  YES-patient states she is having increased lightheadedness.    Does the patient have any problems obtaining medications due to transportation or finances?    [employer] Medicare. Has [employer] grant for pulmonary hypertension medications.   Understanding of regimen: excellent Understanding of indications: excellent Potential of compliance: excellent Patient understands to avoid NSAIDs. Patient understands to avoid decongestants.    Pertinent Lab Values: Labs (01/2023): LDL 56 Labs (02/2023): pro-BNP 1389, Hgb 13.8, K 4.2, creatinine 0.93 Labs (03/2023): K 3.9, Scr 0.86, Na 136, BNP 675.1, anti-SCL 70 negative, anti-centromere negative, RF 396, anti-CCP >250   Vital Signs: Weight: 134.8 lbs (last clinic weight: 130 lbs) Blood pressure: 124/72 mmHg - has not taken tadalafil yet today Heart rate: 104 bpm    Assessment/Plan: 1. Pulmonary hypertension/RV failure: Echo in 03/2023 with  EF 70-75%, moderate LVH, D-shaped septum, mild RV dysfunction with mildly dilated RV, PASP 73 mmHg, mild-moderate MR, IVC dilated. RHC in 03/2023 with normal filling pressures, CI 2.1, and severe PAH with mean PA pressure 46 and PVR 10.4 WU.  CXR in 11/2022 showed no active disease. Suspect group 1 PH possibly related to rheumatologic disease. She has been given the diagnosis of RA in the past though not currently being treated, and she has a h/o Raynaud's phenomenon. Rheumatologic labs suggest RA with elevated RF and anti-CCP.  Need  to rule out group 3 PH and CTEPH but think less likely. Last 6-minute walk test 152 m on 05/22/2023.[name] NYHA class II-III symptoms. She is not volume overloaded on exam today. - High resolution CT chest to look for ILD/lung parenchymal disease is ordered but not  scheduled - PFTs scheduled for 07/26/2023 - V/Q scan to look for evidence of chronic PEs is ordered but not scheduled.  - Continue Lasix 40 mg daily - Continue Opsumit 10 mg daily.  - Continue tadalafil 20 mg daily. Will not uptitrate today with lightheadedness. Have asked her to take at bedtime.  - Having increased lightheadedness. Will stop Jardiance 10 mg daily to see if this helps. Would prefer not to stop tadalafil unless absolutely necessary.   2. Rheumatoid arthritis: Patient carries this diagnosis and has hand changes consistent with RA.  She is not on disease-specific meds currently.  She also has Raynaud's phenomenon.  RF and CCP elevated. - She has a rheumatology appt scheduled for 06/15/2023 3. CAD: Patient had NSTEMIs in 1991 and 1998, per her report thought to be due to vasospasm.  LHC in 03/2023 showed no significant coronary disease.  - Continue ASA 81 mg daily - Continue atorvastatin 20 mg daily 4. Hyperlipidemia: She is on atorvastatin, good lipids in 01/2023.  5. Kyphoscoliosis: This likely contributed to her dyspnea via chest wall restriction.  - To get PFTs as above.  6. HTN: BP controlled.  - If BP remains low and she continues to have dizziness, would consider discontinuing irbesartan in favor of providing more BP room for [facility] medications.     Follow-up in 4 weeks with Dr. Shirlee Latch.     Wilmer Floor, PharmD PGY2 Cardiology Pharmacy Resident   Karle Plumber, PharmD, BCPS, [facility], CPP Heart Failure Clinic Pharmacist [phone]

## 2023-06-12 NOTE — Patient Instructions (Signed)
 It was a pleasure seeing you today!  MEDICATIONS: -We are changing your medications today -Stop Jardiance (dapagliflozin) 10 mg daily -Call if you have questions about your medications. -I will call you in 2 weeks to follow-up with this adjustment.  LABS: -We will call you if your labs need attention.  NEXT APPOINTMENT: Return to clinic in 4 weeks with Dr. Shirlee Latch.  In general, to take care of your heart failure: -Limit your fluid intake to 2 Liters (half-gallon) per day.   -Limit your salt intake to ideally 2-3 grams (2000-3000 mg) per day. -Weigh yourself daily and record, and bring that "weight diary" to your next appointment.  (Weight gain of 2-3 pounds in 1 day typically means fluid weight.) -The medications for your heart are to help your heart and help you live longer.   -Please contact us before stopping any of your heart medications.  Call the clinic at [phone] with questions or to reschedule future appointments.

## 2023-06-15 ENCOUNTER — Encounter: Payer: Medicare Other | Admitting: Internal Medicine

## 2023-06-19 NOTE — Progress Notes (Unsigned)
 Subjective:    Patient ID: Hannah Scott, female    DOB: 09-19-1950, 73 y.o.   MRN: [id number]  HPI: Hannah Scott is a 73 y.o. female who is scheduled for Telephone visit  for follow up appointment for chronic pain and medication refill.  I connected with  Hannah Scott by telephone and verified that I am speaking with the correct person using two identifiers.  Location: Patient: In her Home Provider: In the Office    I discussed the limitations, risks, security and privacy concerns of performing an evaluation and management service by telephone and the availability of in person appointments. I also discussed with the patient that there may be a patient responsible charge related to this service. The patient expressed understanding and agreed to proceed.  She states her pain is located in her lower back and left knee pain. She rates her pain 6. Her current exercise regime is walking  with walkerand performing stretching exercises.  Hannah Scott equivalent is 30.00 MME.   Last Oral Swab was Performed on 01/03/2023, it was caonsistent.    Pain Inventory Average Pain 6 Pain Right Now 6 My pain is burning and aching  In the last 24 hours, has pain interfered with the following? General activity 10 Relation with others 10 Enjoyment of life 10 What TIME of day is your pain at its worst? morning , daytime, evening, night, and varies Sleep (in general) Poor  Pain is worse with: walking, sitting, standing, and some activites Pain improves with: rest and medication Relief from Meds: 5  Family History  Problem Relation Age of Onset   Heart failure Mother    Pneumonia Mother    Hypertension Mother    Heart disease Mother    Stroke Maternal Grandfather    Hypertension Maternal Grandfather    Heart attack Father    Heart disease Father    Asthma Paternal Uncle        PAT UNCLES   Heart attack Paternal Uncle    Social History   Socioeconomic History   Marital status: Widowed     Spouse name: Not on file   Number of children: Not on file   Years of education: Not on file   Highest education level: Not on file  Occupational History   Not on file  Tobacco Use   Smoking status: Never    Passive exposure: Never   Smokeless tobacco: Never  Vaping Use   Vaping status: Never Used  Substance and Sexual Activity   Alcohol use: No    Alcohol/week: 0.0 standard drinks of alcohol   Drug use: No   Sexual activity: Not Currently    Partners: Male    Birth control/protection: Surgical  Other Topics Concern   Not on file  Social History Narrative   Widowed in 2015   2 sons 1 lives in the area the other is in close contact    1 grandchild   Never smoker no drug use no alcohol   Social Drivers of Corporate investment banker Strain: Low Risk  (01/11/2022)   Overall Financial Resource Strain (CARDIA)    Difficulty of Paying Living Expenses: Not hard at all  Food Insecurity: No Food Insecurity (01/11/2022)   Hunger Vital Sign    Worried About Running Out of Food in the Last Year: Never true    Ran Out of Food in the Last Year: Never true  Transportation Needs: No Transportation Needs (01/11/2022)  PRAPARE - Administrator, Civil Service (Medical): No    Lack of Transportation (Non-Medical): No  Physical Activity: Inactive (01/11/2022)   Exercise Vital Sign    Days of Exercise per Week: 0 days    Minutes of Exercise per Session: 0 min  Stress: No Stress Concern Present (01/11/2022)   [employer] of Occupational Health - Occupational Stress Questionnaire    Feeling of Stress : Not at all  Social Connections: Socially Integrated (01/11/2022)   Social Connection and Isolation Panel [NHANES]    Frequency of Communication with Friends and Family: More than three times a week    Frequency of Social Gatherings with Friends and Family: More than three times a week    Attends Religious Services: More than 4 times per year    Active Member of Clubs or  Organizations: Yes    Attends Engineer, structural: More than 4 times per year    Marital Status: Married   Past Surgical History:  Procedure Laterality Date   ANKLE SURGERY Left    ligament   APPENDECTOMY  1987   AT [facility]   BACK SURGERY     Fusion   BREAST LUMPECTOMY  2003   radiation on right   BREAST LUMPECTOMY WITH RADIOACTIVE SEED LOCALIZATION Left 02/12/2016   Procedure: LEFT BREAST LUMPECTOMY WITH RADIOACTIVE SEED LOCALIZATION;  Surgeon: Glenna Fellows, MD;  Location: [facility];  Service: General;  Laterality: Left;   CARDIOVASCULAR STRESS TEST  09/07/05   Nuclear, was negative   CATARACT EXTRACTION Bilateral 01,03   COLONOSCOPY WITH PROPOFOL N/A 05/04/2021   Procedure: COLONOSCOPY WITH PROPOFOL;  Surgeon: Iva Boop, MD;  Location: [facility];  Service: Endoscopy;  Laterality: N/A;   ESOPHAGOGASTRODUODENOSCOPY (EGD) WITH PROPOFOL N/A 05/04/2021   Procedure: ESOPHAGOGASTRODUODENOSCOPY (EGD) WITH PROPOFOL;  Surgeon: Iva Boop, MD;  Location: [facility];  Service: Endoscopy;  Laterality: N/A;   OOPHORECTOMY     LSO -RSO   PELVIC LAPAROSCOPY  1989   RSO, LYSIS OF ADHESIONS   POLYPECTOMY  05/04/2021   Procedure: POLYPECTOMY;  Surgeon: Iva Boop, MD;  Location: [facility];  Service: Endoscopy;;   RIGHT/LEFT HEART CATH AND CORONARY ANGIOGRAPHY N/A 03/28/2023   Procedure: RIGHT/LEFT HEART CATH AND CORONARY ANGIOGRAPHY;  Surgeon: Laurey Morale, MD;  Location: [facility] INVASIVE CV LAB;  Service: Cardiovascular;  Laterality: N/A;   TOTAL ABDOMINAL HYSTERECTOMY  1987   LSO, APPENDECTOMY   TOTAL HIP ARTHROPLASTY Right 10/28/2013   dr Ophelia Charter   TOTAL HIP ARTHROPLASTY Right 10/28/2013   Procedure: TOTAL HIP ARTHROPLASTY ANTERIOR APPROACH;  Surgeon: Eldred Manges, MD;  Location: [facility];  Service: Orthopedics;  Laterality: Right;  Right Total Hip Arthroplasty, Direct Anterior Approach   Past Surgical History:  Procedure Laterality Date   ANKLE SURGERY  Left    ligament   APPENDECTOMY  1987   AT [facility]   BACK SURGERY     Fusion   BREAST LUMPECTOMY  2003   radiation on right   BREAST LUMPECTOMY WITH RADIOACTIVE SEED LOCALIZATION Left 02/12/2016   Procedure: LEFT BREAST LUMPECTOMY WITH RADIOACTIVE SEED LOCALIZATION;  Surgeon: Glenna Fellows, MD;  Location: [facility];  Service: General;  Laterality: Left;   CARDIOVASCULAR STRESS TEST  09/07/05   Nuclear, was negative   CATARACT EXTRACTION Bilateral 01,03   COLONOSCOPY WITH PROPOFOL N/A 05/04/2021   Procedure: COLONOSCOPY WITH PROPOFOL;  Surgeon: Iva Boop, MD;  Location: [facility];  Service: Endoscopy;  Laterality: N/A;   ESOPHAGOGASTRODUODENOSCOPY (EGD) WITH PROPOFOL N/A 05/04/2021   Procedure: ESOPHAGOGASTRODUODENOSCOPY (EGD) WITH PROPOFOL;  Surgeon: Iva Boop, MD;  Location: [facility];  Service: Endoscopy;  Laterality: N/A;   OOPHORECTOMY     LSO -RSO   PELVIC LAPAROSCOPY  1989   RSO, LYSIS OF ADHESIONS   POLYPECTOMY  05/04/2021   Procedure: POLYPECTOMY;  Surgeon: Iva Boop, MD;  Location: [facility];  Service: Endoscopy;;   RIGHT/LEFT HEART CATH AND CORONARY ANGIOGRAPHY N/A 03/28/2023   Procedure: RIGHT/LEFT HEART CATH AND CORONARY ANGIOGRAPHY;  Surgeon: Laurey Morale, MD;  Location: [facility] INVASIVE CV LAB;  Service: Cardiovascular;  Laterality: N/A;   TOTAL ABDOMINAL HYSTERECTOMY  1987   LSO, APPENDECTOMY   TOTAL HIP ARTHROPLASTY Right 10/28/2013   dr Ophelia Charter   TOTAL HIP ARTHROPLASTY Right 10/28/2013   Procedure: TOTAL HIP ARTHROPLASTY ANTERIOR APPROACH;  Surgeon: Eldred Manges, MD;  Location: [facility];  Service: Orthopedics;  Laterality: Right;  Right Total Hip Arthroplasty, Direct Anterior Approach   Past Medical History:  Diagnosis Date   Anemia    hx   Arthritis    Asthma    PFTs, February, 2011, moderate obstructive disease with response to bronchodilators, normal lung volumes, moderate reduction in diffusing capacity   Atrial septal aneurysm     Echo, 2008-not noted on 13 echo   CAD (coronary artery disease)    90% distal LAD in the past  /   nuclear, 2008, no ischemia, ejection fraction 70%   Cancer (HCC) 2002   DUCTAL CIS--S/P LUMPECTOMY, RADIATION AND 6 WEEKS OF TAMOXIFEN   D-dimer, elevated    January, 2014   Depression    Ejection fraction    EF 60%, echo, October, 2008   Elevated CPK    January, 2014   Endometriosis 1989   RIGHT TUBE   Endometriosis 1987   LEFT TUBE/OVARY W FOCAL IN-SITU ENDOMETRIAL ADENOCARCINOMA   GERD (gastroesophageal reflux disease)    occ   H/O hiatal hernia    ?   Hyperlipidemia    Hypertension    Hypothyroidism    Patient has had in the past that she does not need treatment   Kyphoscoliosis    Obstructive airway disease (HCC)    Pinched nerve    lower back   Shortness of breath    Echo 2/22: EF 60-65, no RWMA, mild LVH, GR 1  DD, GLS -24%, normal RVSF, mild MR, trivial AI, borderline dilation of aortic root (39 mm)   UTI (lower urinary tract infection)    There were no vitals taken for this visit.  Opioid Risk Score:   Fall Risk Score:  `1  Depression screen [facility] 2/9     04/14/2023   10:53 AM 03/16/2023    2:16 PM 01/03/2023    2:31 PM 11/22/2022    3:06 PM 10/21/2022    2:10 PM 09/13/2022    2:51 PM 08/15/2022   11:52 AM  Depression screen PHQ 2/9  Decreased Interest 0 0 1 0 1 1 1   Down, Depressed, Hopeless 0 0 1 0 1 1 1   PHQ - 2 Score 0 0 2 0 2 2 2     Review of Systems  Musculoskeletal:  Positive for back pain.  All other systems reviewed and are negative.     Objective:   Physical Exam Vitals and nursing note reviewed.  Musculoskeletal:     Comments: No Physical Exam Performed: Telephone Visit  Assessment & Plan:  1. Lumbar post laminectomy syndrome with severe kyphoscoliosis thoracolumbar spine, s/p lumbar fusion/  06/20/2023 Continue current medication regimen. Refilled:  Hydrocodone 10/325mg  one tablet every 8 hours #90. We will continue the  opioid monitoring program, this consists of regular clinic visits, examinations, urine drug screen, pill counts as well as use of [place] Controlled Substance Reporting system. A 12 month History has been reviewed on the [place] Controlled Substance Reporting System on 06/20/2023 2. Right Hip OA: S/P Right Hip Replacement 10/28/2013. 06/20/2023 3.Depression: Continue Cymbalta. Has Family Support and Friends.  Counseling with Renato Gails. 06/20/2023 4. Bilateral Hip Pain/ Right Greater Trochanteric Tenderness: No complaints today. Continue to Monitor. Continue with Ice and Heat Therapy. 06/20/2023 5. Poor Appetite/ Frail: No complaints today. Hannah Scott states PCP following. Continue to monitor. 06/20/2023 6. Chronic Left  Knee Pain: Continue HEP as Tolerated. Continue to Monitor. 06/20/2023 7. Chronic Right  shoulder pain:  No complaints today.Continue HEP as tolerated. Continue to Monitor. 05/22/2023   F/U in 1 month  Telephone Visit Established Patient Location of Patient: In Her Home Location of Provider: In the Office

## 2023-06-20 ENCOUNTER — Encounter: Payer: Self-pay | Admitting: Registered Nurse

## 2023-06-20 ENCOUNTER — Encounter: Payer: Medicare Other | Attending: Registered Nurse | Admitting: Registered Nurse

## 2023-06-20 VITALS — Ht 60.0 in | Wt 131.0 lb

## 2023-06-20 DIAGNOSIS — M25562 Pain in left knee: Secondary | ICD-10-CM | POA: Insufficient documentation

## 2023-06-20 DIAGNOSIS — Z79891 Long term (current) use of opiate analgesic: Secondary | ICD-10-CM | POA: Insufficient documentation

## 2023-06-20 DIAGNOSIS — M48062 Spinal stenosis, lumbar region with neurogenic claudication: Secondary | ICD-10-CM | POA: Insufficient documentation

## 2023-06-20 DIAGNOSIS — G894 Chronic pain syndrome: Secondary | ICD-10-CM | POA: Diagnosis not present

## 2023-06-20 DIAGNOSIS — Z5181 Encounter for therapeutic drug level monitoring: Secondary | ICD-10-CM | POA: Insufficient documentation

## 2023-06-20 DIAGNOSIS — G8929 Other chronic pain: Secondary | ICD-10-CM | POA: Insufficient documentation

## 2023-06-20 DIAGNOSIS — M24551 Contracture, right hip: Secondary | ICD-10-CM | POA: Insufficient documentation

## 2023-06-20 MED ORDER — HYDROCODONE-ACETAMINOPHEN 10-325 MG PO TABS: 1.0000 | ORAL_TABLET | Freq: Three times a day (TID) | ORAL | 0 refills | Status: DC | PRN

## 2023-06-21 ENCOUNTER — Telehealth: Payer: Self-pay | Admitting: Registered Nurse

## 2023-06-21 MED ORDER — HYDROCODONE-ACETAMINOPHEN 10-325 MG PO TABS: 1.0000 | ORAL_TABLET | Freq: Three times a day (TID) | ORAL | 0 refills | Status: DC | PRN

## 2023-06-21 NOTE — Telephone Encounter (Signed)
PMP was Reviewed.  Hydrocodone e-scribed to pharmacy.

## 2023-06-21 NOTE — Telephone Encounter (Signed)
 Dorene Grebe from [facility] called 2/25 and LVM 3:04pm , needs a new ICD-10 on rx to be resent , can not use LBP ICD-10

## 2023-06-22 ENCOUNTER — Encounter: Payer: Self-pay | Admitting: Internal Medicine

## 2023-06-23 DIAGNOSIS — R0609 Other forms of dyspnea: Secondary | ICD-10-CM | POA: Diagnosis not present

## 2023-06-23 DIAGNOSIS — J4540 Moderate persistent asthma, uncomplicated: Secondary | ICD-10-CM | POA: Diagnosis not present

## 2023-06-23 MED ORDER — ALBUTEROL SULFATE HFA 108 (90 BASE) MCG/ACT IN AERS: 2.0000 | INHALATION_SPRAY | RESPIRATORY_TRACT | 11 refills | Status: DC | PRN

## 2023-06-26 ENCOUNTER — Telehealth (HOSPITAL_COMMUNITY): Payer: Self-pay

## 2023-06-26 NOTE — Telephone Encounter (Signed)
 I called the patient to follow-up with how her symptoms (lightheadedness and dizziness) were doing after stopping her Jardiance at her visit 2 weeks ago. Patient reported that there have been no changes in her symptoms since stopping the Jardiance. She reports her blood pressures at home have ranged from 100-110/45-55 mmHg. She states that she still gets lightheaded with minimal activity  Instructed patient to hold her irbesartan for now to see if this helps with her symptoms. Requested that she continue to check her blood pressure at home and bring her readings to clinic follow-up with Dr. Shirlee Latch in 2 weeks (07/10/23).  Wilmer Floor, PharmD PGY2 Cardiology Pharmacy Resident

## 2023-06-27 NOTE — Progress Notes (Deleted)
 Office Visit Note  Patient: Hannah Scott             Date of Birth: July 17, 1950           MRN: [id number]             PCP: Pincus Sanes, MD Referring: Laurey Morale, MD Visit Date: 06/28/2023 Occupation: @GUAROCC @  Subjective:  No chief complaint on file.   History of Present Illness: Hannah Scott is a 73 y.o. female ***     Activities of Daily Living:  Patient reports morning stiffness for *** {minute/hour:19697}.   Patient {ACTIONS;DENIES/REPORTS:21021675::"Denies"} nocturnal pain.  Difficulty dressing/grooming: {ACTIONS;DENIES/REPORTS:21021675::"Denies"} Difficulty climbing stairs: {ACTIONS;DENIES/REPORTS:21021675::"Denies"} Difficulty getting out of chair: {ACTIONS;DENIES/REPORTS:21021675::"Denies"} Difficulty using hands for taps, buttons, cutlery, and/or writing: {ACTIONS;DENIES/REPORTS:21021675::"Denies"}  No Rheumatology ROS completed.   PMFS History:  Patient Active Problem List   Diagnosis Date Noted   Vasospasm (HCC) 03/13/2023   Chronic respiratory failure (HCC) 03/13/2023   Leg cramping 12/16/2022   B12 deficiency 01/22/2022   Zinc deficiency 01/19/2022   Chronic diastolic heart failure (HCC) 01/19/2022   Severe pulmonary hypertension (HCC) 01/19/2022   Abnormal CT of the chest 12/01/2021   Anemia due to zinc deficiency 11/02/2021   LVH (left ventricular hypertrophy) 10/29/2021   Deficiency anemia 10/29/2021   Iron deficiency anemia secondary to inadequate dietary iron intake 10/29/2021   Stage 3b chronic kidney disease (HCC) 10/29/2021   Acute cystitis with hematuria    NSTEMI (non-ST elevated myocardial infarction) (HCC)    Esophageal dysphagia    Benign neoplasm of ascending colon    Benign neoplasm of descending colon    Kyphoscoliosis 03/30/2021   GERD (gastroesophageal reflux disease) 01/11/2021   Vitamin D deficiency 01/11/2021   Insomnia 09/11/2019   Murmur, cardiac 01/16/2018   Moderate persistent asthma 12/13/2017   Osteoporosis  01/07/2017   Diabetes mellitus without complication (HCC) 07/07/2016   History of breast cancer 01/06/2016   Intraductal papilloma of left breast 01/06/2016   Essential hypertension 06/08/2015   DOE (dyspnea on exertion) 06/08/2015   Fatigue 05/20/2015   Back pain 04/22/2015   Multinodular goiter 12/01/2011   Hypothyroidism    CAD (coronary artery disease)    Atrial septal aneurysm    Hyperlipidemia    Spinal stenosis, lumbar region, with neurogenic claudication 07/01/2011   Lumbar postlaminectomy syndrome 07/01/2011   Osteoarthritis of right hip 07/01/2011   Contracture of right hip 07/01/2011   Endometriosis    Depression 12/24/2009   Migraine headache 05/19/2006    Past Medical History:  Diagnosis Date   Anemia    hx   Arthritis    Asthma    PFTs, February, 2011, moderate obstructive disease with response to bronchodilators, normal lung volumes, moderate reduction in diffusing capacity   Atrial septal aneurysm    Echo, 2008-not noted on 13 echo   CAD (coronary artery disease)    90% distal LAD in the past  /   nuclear, 2008, no ischemia, ejection fraction 70%   Cancer (HCC) 2002   DUCTAL CIS--S/P LUMPECTOMY, RADIATION AND 6 WEEKS OF TAMOXIFEN   D-dimer, elevated    January, 2014   Depression    Ejection fraction    EF 60%, echo, October, 2008   Elevated CPK    January, 2014   Endometriosis 1989   RIGHT TUBE   Endometriosis 1987   LEFT TUBE/OVARY W FOCAL IN-SITU ENDOMETRIAL ADENOCARCINOMA   GERD (gastroesophageal reflux disease)    occ   H/O hiatal hernia    ?  Hyperlipidemia    Hypertension    Hypothyroidism    Patient has had in the past that she does not need treatment   Kyphoscoliosis    Obstructive airway disease (HCC)    Pinched nerve    lower back   Shortness of breath    Echo 2/22: EF 60-65, no RWMA, mild LVH, GR 1  DD, GLS -24%, normal RVSF, mild MR, trivial AI, borderline dilation of aortic root (39 mm)   UTI (lower urinary tract infection)      Family History  Problem Relation Age of Onset   Heart failure Mother    Pneumonia Mother    Hypertension Mother    Heart disease Mother    Stroke Maternal Grandfather    Hypertension Maternal Grandfather    Heart attack Father    Heart disease Father    Asthma Paternal Uncle        PAT UNCLES   Heart attack Paternal Uncle    Past Surgical History:  Procedure Laterality Date   ANKLE SURGERY Left    ligament   APPENDECTOMY  1987   AT [facility]   BACK SURGERY     Fusion   BREAST LUMPECTOMY  2003   radiation on right   BREAST LUMPECTOMY WITH RADIOACTIVE SEED LOCALIZATION Left 02/12/2016   Procedure: LEFT BREAST LUMPECTOMY WITH RADIOACTIVE SEED LOCALIZATION;  Surgeon: Glenna Fellows, MD;  Location: [facility];  Service: General;  Laterality: Left;   CARDIOVASCULAR STRESS TEST  09/07/05   Nuclear, was negative   CATARACT EXTRACTION Bilateral 01,03   COLONOSCOPY WITH PROPOFOL N/A 05/04/2021   Procedure: COLONOSCOPY WITH PROPOFOL;  Surgeon: Iva Boop, MD;  Location: [facility];  Service: Endoscopy;  Laterality: N/A;   ESOPHAGOGASTRODUODENOSCOPY (EGD) WITH PROPOFOL N/A 05/04/2021   Procedure: ESOPHAGOGASTRODUODENOSCOPY (EGD) WITH PROPOFOL;  Surgeon: Iva Boop, MD;  Location: [facility];  Service: Endoscopy;  Laterality: N/A;   OOPHORECTOMY     LSO -RSO   PELVIC LAPAROSCOPY  1989   RSO, LYSIS OF ADHESIONS   POLYPECTOMY  05/04/2021   Procedure: POLYPECTOMY;  Surgeon: Iva Boop, MD;  Location: [facility];  Service: Endoscopy;;   RIGHT/LEFT HEART CATH AND CORONARY ANGIOGRAPHY N/A 03/28/2023   Procedure: RIGHT/LEFT HEART CATH AND CORONARY ANGIOGRAPHY;  Surgeon: Laurey Morale, MD;  Location: [facility] INVASIVE CV LAB;  Service: Cardiovascular;  Laterality: N/A;   TOTAL ABDOMINAL HYSTERECTOMY  1987   [facility], APPENDECTOMY   TOTAL HIP ARTHROPLASTY Right 10/28/2013   dr Ophelia Charter   TOTAL HIP ARTHROPLASTY Right 10/28/2013   Procedure: TOTAL HIP ARTHROPLASTY ANTERIOR  APPROACH;  Surgeon: Eldred Manges, MD;  Location: [facility];  Service: Orthopedics;  Laterality: Right;  Right Total Hip Arthroplasty, Direct Anterior Approach   Social History   Social History Narrative   Widowed in 2015   2 sons 1 lives in the area the other is in close contact    1 grandchild   Never smoker no drug use no alcohol   Immunization History  Administered Date(s) Administered   Fluad Quad(high Dose 65+) 01/04/2019, 01/20/2022   Fluad Trivalent(High Dose 65+) 02/07/2023   Influenza Split 03/23/2011, 02/15/2012   Influenza Whole 02/12/2003, 12/24/2009   Influenza, High Dose Seasonal PF 01/06/2016, 01/10/2017, 03/16/2018   Influenza,inj,Quad PF,6+ Mos 01/29/2013, 01/03/2014, 02/11/2015   Influenza-Unspecified 04/09/2021   Moderna Covid-19 Vaccine Bivalent Booster 29yrs & up 03/26/2021   Moderna SARS-COV2 Booster Vaccination 04/10/2020   Moderna Sars-Covid-2 Vaccination 06/21/2019, 07/24/2019   Pneumococcal Conjugate-13 05/06/2015  Pneumococcal Polysaccharide-23 07/06/2016   Tdap 01/09/2019     Objective: Vital Signs: There were no vitals taken for this visit.   Physical Exam   Musculoskeletal Exam: ***  CDAI Exam: CDAI Score: -- Patient Global: --; Provider Global: -- Swollen: --; Tender: -- Joint Exam 06/28/2023   No joint exam has been documented for this visit   There is currently no information documented on the homunculus. Go to the Rheumatology activity and complete the homunculus joint exam.  Investigation: No additional findings.  Imaging: No results found.  Recent Labs: Lab Results  Component Value Date   WBC 7.0 03/13/2023   HGB 14.3 03/28/2023   PLT 186 03/13/2023   NA 136 04/17/2023   K 3.9 04/17/2023   CL 96 (L) 04/17/2023   CO2 33 (H) 04/17/2023   GLUCOSE 95 04/17/2023   BUN 21 04/17/2023   CREATININE 0.86 04/17/2023   BILITOT 1.0 02/07/2023   ALKPHOS 93 02/07/2023   AST 34 02/07/2023   ALT 30 02/07/2023   PROT 6.4 02/07/2023    ALBUMIN 4.1 02/07/2023   CALCIUM 9.4 04/17/2023   GFRAA 79 06/05/2020    Speciality Comments: No specialty comments available.  Procedures:  No procedures performed Allergies: Fluticasone-salmeterol, Norvasc [amlodipine besylate], Tape, Heparin, and Morphine   Assessment / Plan:     Visit Diagnoses: No diagnosis found.  Orders: No orders of the defined types were placed in this encounter.  No orders of the defined types were placed in this encounter.   Face-to-face time spent with patient was *** minutes. Greater than 50% of time was spent in counseling and coordination of care.  Follow-Up Instructions: No follow-ups on file.   Fuller Plan, MD  Note - This record has been created using [employer].  Chart creation errors have been sought, but may not always  have been located. Such creation errors do not reflect on  the standard of medical care.

## 2023-06-28 ENCOUNTER — Ambulatory Visit: Payer: Medicare Other | Admitting: Emergency Medicine

## 2023-06-28 ENCOUNTER — Encounter: Payer: Medicare Other | Admitting: Internal Medicine

## 2023-07-04 ENCOUNTER — Ambulatory Visit: Payer: Medicare Other | Admitting: Emergency Medicine

## 2023-07-07 ENCOUNTER — Telehealth (HOSPITAL_COMMUNITY): Payer: Self-pay | Admitting: Cardiology

## 2023-07-10 ENCOUNTER — Encounter (HOSPITAL_COMMUNITY): Payer: Medicare Other | Admitting: Cardiology

## 2023-07-11 ENCOUNTER — Ambulatory Visit (HOSPITAL_COMMUNITY)
Admission: RE | Admit: 2023-07-11 | Discharge: 2023-07-11 | Disposition: A | Discharge status: Home / Self Care | Source: Ambulatory Visit | Attending: Cardiology | Admitting: Cardiology

## 2023-07-11 ENCOUNTER — Encounter (HOSPITAL_COMMUNITY)
Admission: RE | Admit: 2023-07-11 | Discharge: 2023-07-11 | Disposition: A | Discharge status: Home / Self Care | Source: Ambulatory Visit | Attending: Cardiology | Admitting: Cardiology

## 2023-07-11 DIAGNOSIS — I272 Pulmonary hypertension, unspecified: Secondary | ICD-10-CM | POA: Diagnosis not present

## 2023-07-11 DIAGNOSIS — I517 Cardiomegaly: Secondary | ICD-10-CM | POA: Diagnosis not present

## 2023-07-11 DIAGNOSIS — J9811 Atelectasis: Secondary | ICD-10-CM | POA: Diagnosis not present

## 2023-07-11 MED ORDER — TECHNETIUM TO 99M ALBUMIN AGGREGATED
4.3100 | Freq: Once | INTRAVENOUS | Status: AC | PRN
  Administered 2023-07-11: 4.31 via INTRAVENOUS

## 2023-07-13 ENCOUNTER — Telehealth (HOSPITAL_COMMUNITY): Payer: Self-pay

## 2023-07-13 NOTE — Telephone Encounter (Signed)
 Approved for cpt code 71250(2nd approval) (High Res Chest CT) [id number] valid 3/20-4/18

## 2023-07-17 ENCOUNTER — Ambulatory Visit (HOSPITAL_COMMUNITY)
Admission: RE | Admit: 2023-07-17 | Discharge: 2023-07-17 | Disposition: A | Discharge status: Home / Self Care | Source: Ambulatory Visit | Attending: Cardiology | Admitting: Cardiology

## 2023-07-17 DIAGNOSIS — R918 Other nonspecific abnormal finding of lung field: Secondary | ICD-10-CM | POA: Diagnosis not present

## 2023-07-17 DIAGNOSIS — I272 Pulmonary hypertension, unspecified: Secondary | ICD-10-CM | POA: Insufficient documentation

## 2023-07-17 IMAGING — DX DG CHEST 1V PORT
1 series · 1 of 1 positions shown · non-contrast
Comparison: PA and lateral 09/01/2020.

CLINICAL DATA: Opacity of lung on imaging study.

EXAM:
PORTABLE CHEST 1 VIEW

[chest]
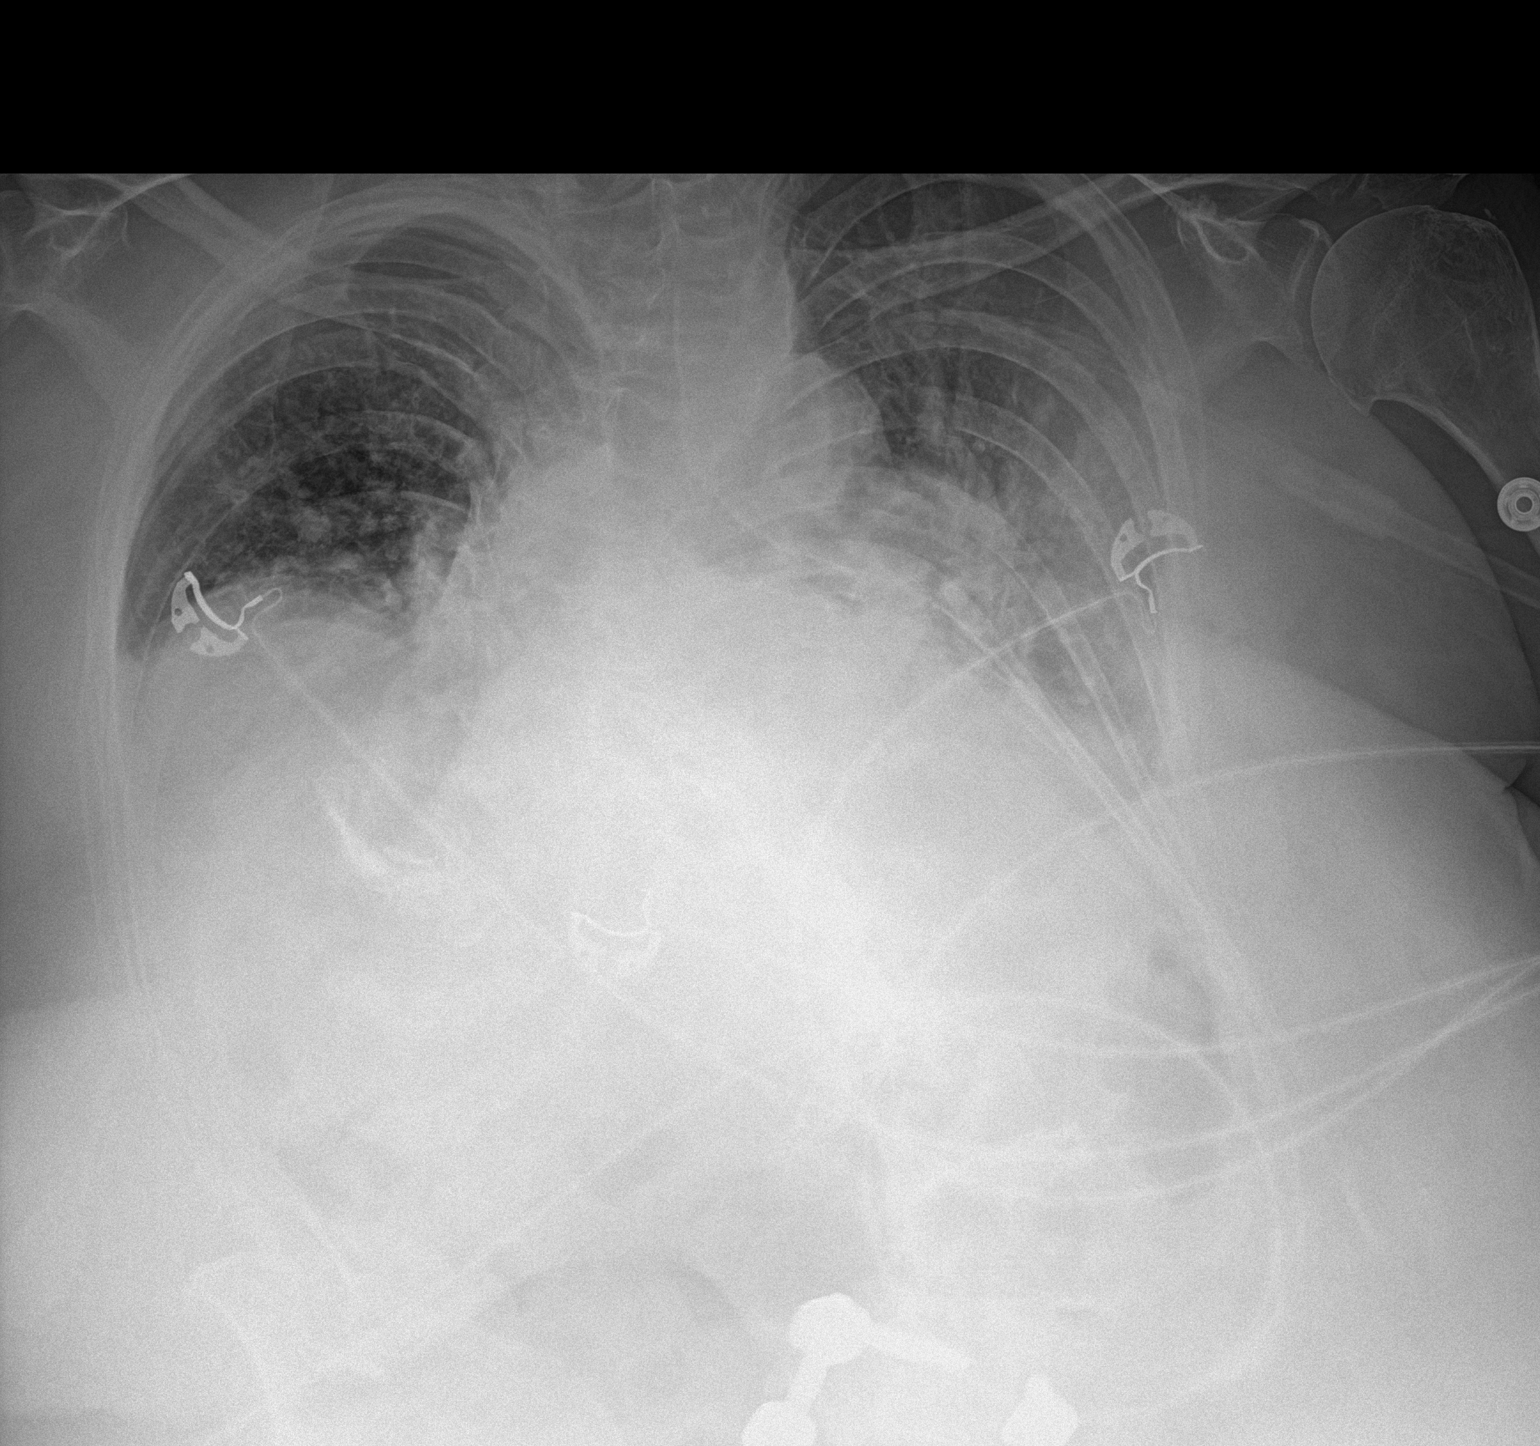

[1 of 1 positions shown; findings below may reference images not displayed]

FINDINGS: [DATE] a.m., 09/11/2021. The lungs are expiratory. There is similar
cardiomegaly to the prior study with increased central vascular
prominence which could be due to low lung volumes or perihilar
vascular congestion.

There is increased opacity in the left low lung field which could be
atelectasis or consolidation and a small left pleural effusion
appears to have formed.

The upper lung fields generally clear with obscuration of the right
lower lung field due to an asymmetrically elevated hemidiaphragm.

The mediastinum is stable. There is a aortic tortuosity with
atherosclerosis.

The thoracic cage grossly intact with severe dextroscoliosis of the
thoracic spine.
IMPRESSION: 1. Limited exam with low inspiration, but there is asymmetric
opacity in the left lower zone which is suspected more than typical
for simple atelectasis. Consolidation and/or effusion suspected.
2. Cardiomegaly with increased central vascular prominence which
could relate to low lung volumes or perihilar vascular congestion.
3. Clinical correlation and radiographic follow-up recommended.

## 2023-07-19 IMAGING — DX DG CHEST 1V PORT
1 series · 1 of 1 positions shown · non-contrast
Comparison: 09/11/2021.

CLINICAL DATA: Dyspnea.

EXAM:
PORTABLE CHEST 1 VIEW

[chest ap]
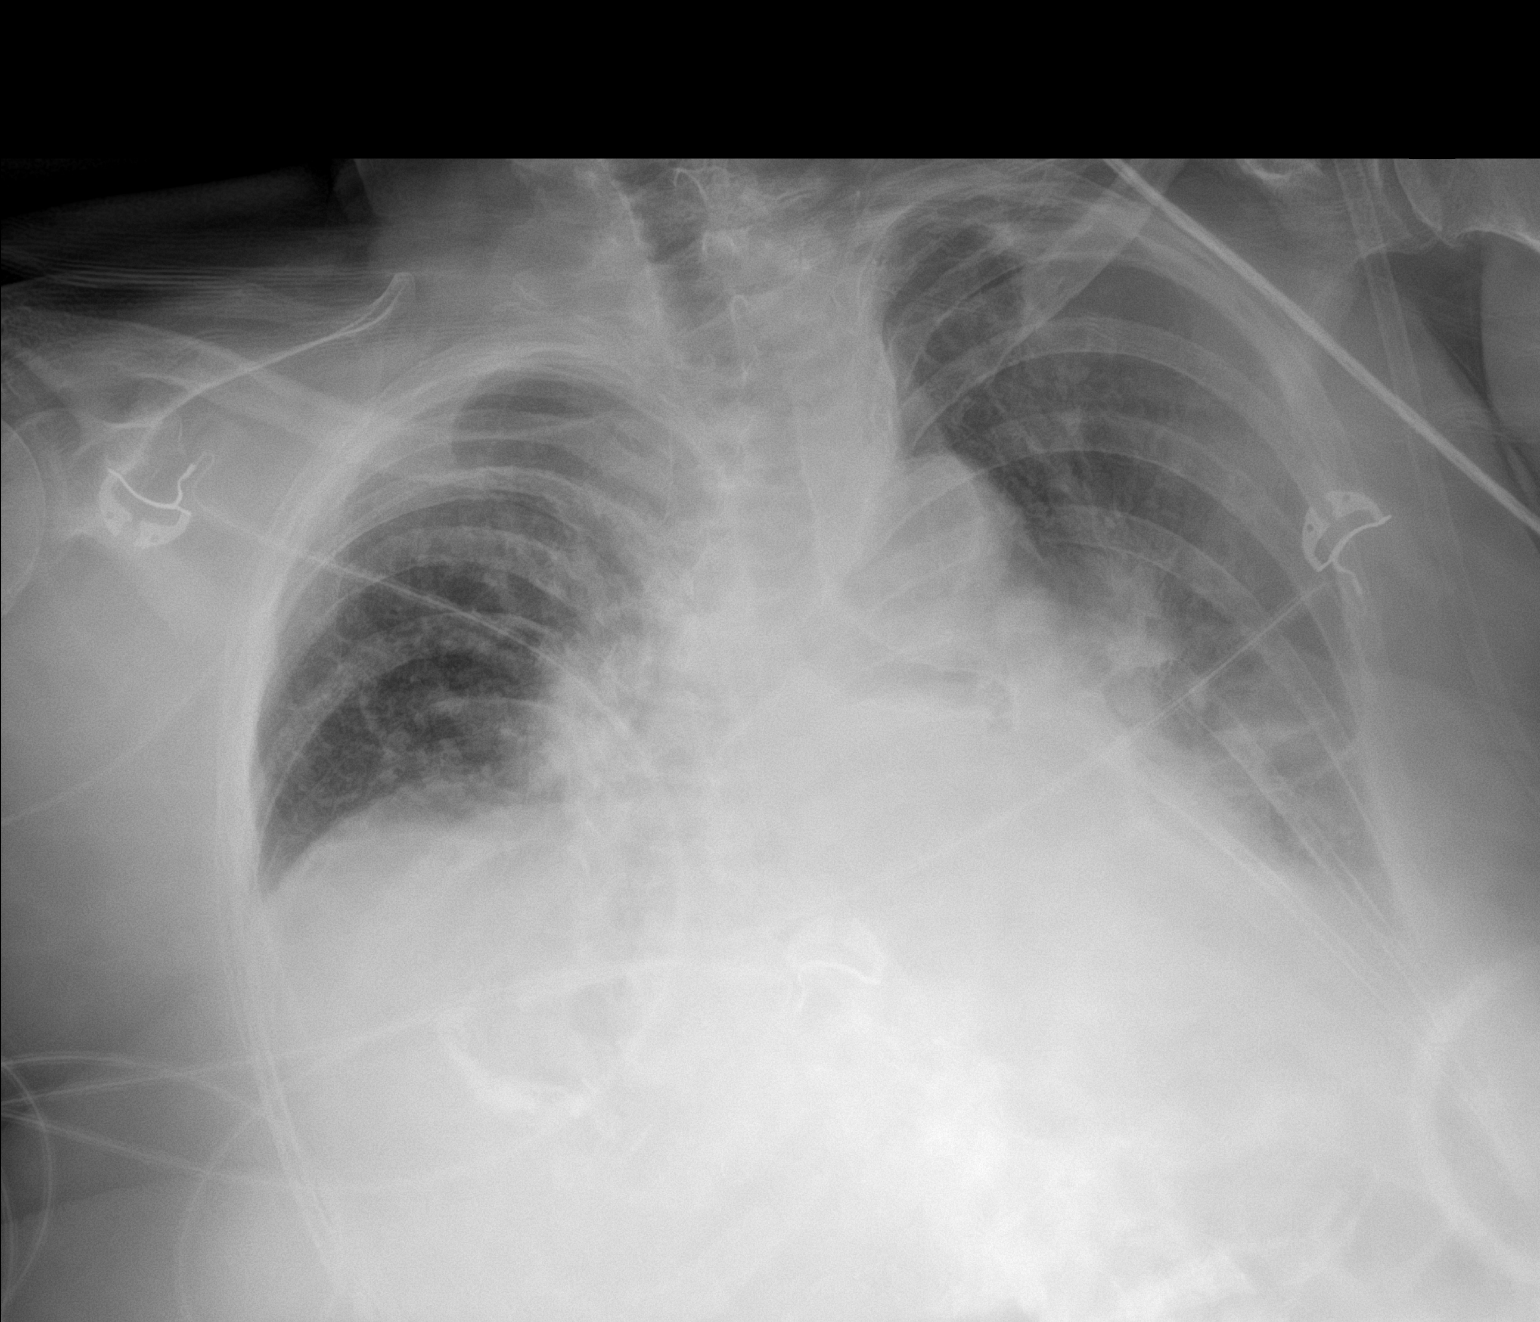

[1 of 1 positions shown; findings below may reference images not displayed]

FINDINGS: [DATE] a.m., 09/13/2021. The heart is enlarged. There is interval
increased vascular prominence.

Lung volumes remain low with increasing left pleural effusion and
left lower lobe consolidation or atelectasis.

Right lower lung field again obscured by the elevated hemidiaphragm.

The upper lung fields are generally clear but there may be a small
right pleural effusion.

There is a stable mediastinum with aortic atherosclerosis and
tortuosity. Severe thoracic dextroscoliosis.
IMPRESSION: 1. Increased perihilar vascular congestion.
2. Increasing left pleural fluid and left lower lobe consolidation
or atelectasis.

## 2023-07-20 ENCOUNTER — Encounter: Payer: Self-pay | Admitting: Registered Nurse

## 2023-07-20 ENCOUNTER — Encounter: Payer: Medicare Other | Attending: Registered Nurse | Admitting: Registered Nurse

## 2023-07-20 VITALS — BP 105/66 | HR 71 | Ht 60.0 in | Wt 135.2 lb

## 2023-07-20 DIAGNOSIS — M25561 Pain in right knee: Secondary | ICD-10-CM | POA: Insufficient documentation

## 2023-07-20 DIAGNOSIS — M25562 Pain in left knee: Secondary | ICD-10-CM | POA: Diagnosis not present

## 2023-07-20 DIAGNOSIS — G8929 Other chronic pain: Secondary | ICD-10-CM | POA: Diagnosis not present

## 2023-07-20 DIAGNOSIS — R0902 Hypoxemia: Secondary | ICD-10-CM | POA: Diagnosis not present

## 2023-07-20 DIAGNOSIS — M24551 Contracture, right hip: Secondary | ICD-10-CM | POA: Insufficient documentation

## 2023-07-20 DIAGNOSIS — M48062 Spinal stenosis, lumbar region with neurogenic claudication: Secondary | ICD-10-CM | POA: Insufficient documentation

## 2023-07-20 DIAGNOSIS — Z1231 Encounter for screening mammogram for malignant neoplasm of breast: Secondary | ICD-10-CM | POA: Diagnosis not present

## 2023-07-20 DIAGNOSIS — Z5181 Encounter for therapeutic drug level monitoring: Secondary | ICD-10-CM | POA: Diagnosis not present

## 2023-07-20 DIAGNOSIS — Z79891 Long term (current) use of opiate analgesic: Secondary | ICD-10-CM | POA: Diagnosis not present

## 2023-07-20 DIAGNOSIS — G894 Chronic pain syndrome: Secondary | ICD-10-CM | POA: Diagnosis not present

## 2023-07-20 LAB — HM MAMMOGRAPHY

## 2023-07-20 MED ORDER — HYDROCODONE-ACETAMINOPHEN 10-325 MG PO TABS: 1.0000 | ORAL_TABLET | Freq: Three times a day (TID) | ORAL | 0 refills | Status: DC | PRN

## 2023-07-20 NOTE — Progress Notes (Signed)
 Subjective:    Patient ID: Hannah Scott, female    DOB: 1950/06/06, 73 y.o.   MRN: [id number]  HPI: Hannah Scott is a 73 y.o. female who returns for follow up appointment for chronic pain and medication refill. She states her pain is located in her lower back and bilateral knees L>R. She rates her pain 7. Her current exercise regime is walking with her walker.   [name] arrived to office with oxygen desaturation, she denies SOB, she refused ED or Urgent Care evaluation. O2 sat was re-checked.   [name] Morphine equivalent is 30.00 MME.   Oral Swab was Performed Today.      Pain Inventory Average Pain 7 Pain Right Now 7 My pain is dull and aching  In the last 24 hours, has pain interfered with the following? General activity 10 Relation with others 10 Enjoyment of life 10 What TIME of day is your pain at its worst? morning , daytime, evening, and night Sleep (in general) Poor  Pain is worse with: walking, bending, and standing Pain improves with: rest and medication Relief from Meds: 5  Family History  Problem Relation Age of Onset   Heart failure Mother    Pneumonia Mother    Hypertension Mother    Heart disease Mother    Stroke Maternal Grandfather    Hypertension Maternal Grandfather    Heart attack Father    Heart disease Father    Asthma Paternal Uncle        PAT UNCLES   Heart attack Paternal Uncle    Social History   Socioeconomic History   Marital status: Widowed    Spouse name: Not on file   Number of children: Not on file   Years of education: Not on file   Highest education level: Not on file  Occupational History   Not on file  Tobacco Use   Smoking status: Never    Passive exposure: Never   Smokeless tobacco: Never  Vaping Use   Vaping status: Never Used  Substance and Sexual Activity   Alcohol use: No    Alcohol/week: 0.0 standard drinks of alcohol   Drug use: No   Sexual activity: Not Currently    Partners: Male    Birth  control/protection: Surgical  Other Topics Concern   Not on file  Social History Narrative   Widowed in 2015   2 sons 1 lives in the area the other is in close contact    1 grandchild   Never smoker no drug use no alcohol   Social Drivers of Corporate investment banker Strain: Low Risk  (01/11/2022)   Overall Financial Resource Strain (CARDIA)    Difficulty of Paying Living Expenses: Not hard at all  Food Insecurity: No Food Insecurity (01/11/2022)   Hunger Vital Sign    Worried About Running Out of Food in the Last Year: Never true    Ran Out of Food in the Last Year: Never true  Transportation Needs: No Transportation Needs (01/11/2022)   PRAPARE - Administrator, Civil Service (Medical): No    Lack of Transportation (Non-Medical): No  Physical Activity: Inactive (01/11/2022)   Exercise Vital Sign    Days of Exercise per Week: 0 days    Minutes of Exercise per Session: 0 min  Stress: No Stress Concern Present (01/11/2022)   [employer] of Occupational Health - Occupational Stress Questionnaire    Feeling of Stress : Not at all  Social Connections: Socially Integrated (01/11/2022)   Social Connection and Isolation Panel [NHANES]    Frequency of Communication with Friends and Family: More than three times a week    Frequency of Social Gatherings with Friends and Family: More than three times a week    Attends Religious Services: More than 4 times per year    Active Member of Clubs or Organizations: Yes    Attends Engineer, structural: More than 4 times per year    Marital Status: Married   Past Surgical History:  Procedure Laterality Date   ANKLE SURGERY Left    ligament   APPENDECTOMY  1987   AT [facility]   BACK SURGERY     Fusion   BREAST LUMPECTOMY  2003   radiation on right   BREAST LUMPECTOMY WITH RADIOACTIVE SEED LOCALIZATION Left 02/12/2016   Procedure: LEFT BREAST LUMPECTOMY WITH RADIOACTIVE SEED LOCALIZATION;  Surgeon: Glenna Fellows, MD;   Location: [facility];  Service: General;  Laterality: Left;   CARDIOVASCULAR STRESS TEST  09/07/05   Nuclear, was negative   CATARACT EXTRACTION Bilateral 01,03   COLONOSCOPY WITH PROPOFOL N/A 05/04/2021   Procedure: COLONOSCOPY WITH PROPOFOL;  Surgeon: Iva Boop, MD;  Location: [facility];  Service: Endoscopy;  Laterality: N/A;   ESOPHAGOGASTRODUODENOSCOPY (EGD) WITH PROPOFOL N/A 05/04/2021   Procedure: ESOPHAGOGASTRODUODENOSCOPY (EGD) WITH PROPOFOL;  Surgeon: Iva Boop, MD;  Location: [facility];  Service: Endoscopy;  Laterality: N/A;   OOPHORECTOMY     LSO -RSO   PELVIC LAPAROSCOPY  1989   RSO, LYSIS OF ADHESIONS   POLYPECTOMY  05/04/2021   Procedure: POLYPECTOMY;  Surgeon: Iva Boop, MD;  Location: [facility];  Service: Endoscopy;;   RIGHT/LEFT HEART CATH AND CORONARY ANGIOGRAPHY N/A 03/28/2023   Procedure: RIGHT/LEFT HEART CATH AND CORONARY ANGIOGRAPHY;  Surgeon: Laurey Morale, MD;  Location: [facility] INVAS[facility] CV LAB;  Service: Cardiovascular;  Laterality: N/A;   TOTAL ABDOMINAL HYSTERECTOMY  1987   LSO, APPENDECTOMY   TOTAL HIP ARTHROPLASTY Right 10/28/2013   dr Ophelia Charter   TOTAL HIP ARTHROPLASTY Right 10/28/2013   Procedure: TOTAL HIP ARTHROPLASTY ANTERIOR APPROACH;  Surgeon: Eldred Manges, MD;  Location: [facility];  Service: Orthopedics;  Laterality: Right;  Right Total Hip Arthroplasty, Direct Anterior Approach   Past Surgical History:  Procedure Laterality Date   ANKLE SURGERY Left    ligament   APPENDECTOMY  1987   AT [facility]   BACK SURGERY     Fusion   BREAST LUMPECTOMY  2003   radiation on right   BREAST LUMPECTOMY WITH RADIOACTIVE SEED LOCALIZATION Left 02/12/2016   Procedure: LEFT BREAST LUMPECTOMY WITH RADIOACTIVE SEED LOCALIZATION;  Surgeon: Glenna Fellows, MD;  Location: [facility];  Service: General;  Laterality: Left;   CARDIOVASCULAR STRESS TEST  09/07/05   Nuclear, was negative   CATARACT EXTRACTION Bilateral 01,03    COLONOSCOPY WITH PROPOFOL N/A 05/04/2021   Procedure: COLONOSCOPY WITH PROPOFOL;  Surgeon: Iva Boop, MD;  Location: [facility];  Service: Endoscopy;  Laterality: N/A;   ESOPHAGOGASTRODUODENOSCOPY (EGD) WITH PROPOFOL N/A 05/04/2021   Procedure: ESOPHAGOGASTRODUODENOSCOPY (EGD) WITH PROPOFOL;  Surgeon: Iva Boop, MD;  Location: [facility];  Service: Endoscopy;  Laterality: N/A;   OOPHORECTOMY     LSO -RSO   PELVIC LAPAROSCOPY  1989   RSO, LYSIS OF ADHESIONS   POLYPECTOMY  05/04/2021   Procedure: POLYPECTOMY;  Surgeon: Iva Boop, MD;  Location: [facility];  Service: Endoscopy;;  RIGHT/LEFT HEART CATH AND CORONARY ANGIOGRAPHY N/A 03/28/2023   Procedure: RIGHT/LEFT HEART CATH AND CORONARY ANGIOGRAPHY;  Surgeon: Laurey Morale, MD;  [facility] CV [facility];  Service: Cardiovascular;  Laterality: N/A;   TOTAL ABDOMINAL HYSTERECTOMY  1987   LSO, APPENDECTOMY   TOTAL HIP ARTHROPLASTY Right 10/28/2013   dr Ophelia Charter   TOTAL HIP ARTHROPLASTY Right 10/28/2013   Procedure: TOTAL HIP ARTHROPLASTY ANTERIOR APPROACH;  Surgeon: Eldred Manges, MD;  Location: [facility];  Service: Orthopedics;  Laterality: Right;  Right Total Hip Arthroplasty, Direct Anterior Approach   Past Medical History:  Diagnosis Date   Anemia    hx   Arthritis    Asthma    PFTs, February, 2011, moderate obstructive disease with response to bronchodilators, normal lung volumes, moderate reduction in diffusing capacity   Atrial septal aneurysm    Echo, 2008-not noted on 13 echo   CAD (coronary artery disease)    90% distal LAD in the past  /   nuclear, 2008, no ischemia, ejection fraction 70%   Cancer (HCC) 2002   DUCTAL CIS--S/P LUMPECTOMY, RADIATION AND 6 WEEKS OF TAMOXIFEN   D-dimer, elevated    January, 2014   Depression    Ejection fraction    EF 60%, echo, October, 2008   Elevated CPK    January, 2014   Endometriosis 1989   RIGHT TUBE   Endometriosis 1987   LEFT TUBE/OVARY W FOCAL IN-SITU ENDOMETRIAL  ADENOCARCINOMA   GERD (gastroesophageal reflux disease)    occ   H/O hiatal hernia    ?   Hyperlipidemia    Hypertension    Hypothyroidism    Patient has had in the past that she does not need treatment   Kyphoscoliosis    Obstructive airway disease (HCC)    Pinched nerve    lower back   Shortness of breath    Echo 2/22: EF 60-65, no RWMA, mild LVH, GR 1  DD, GLS -24%, normal RVSF, mild MR, trivial AI, borderline dilation of aortic root (39 mm)   UTI (lower urinary tract infection)    There were no vitals taken for this visit.  Opioid Risk Score:   Fall Risk Score:  `1  Depression screen [facility] 2/9     06/20/2023    1:37 PM 04/14/2023   10:53 AM 03/16/2023    2:16 PM 01/03/2023    2:31 PM 11/22/2022    3:06 PM 10/21/2022    2:10 PM 09/13/2022    2:51 PM  Depression screen PHQ 2/9  Decreased Interest 0 0 0 1 0 1 1  Down, Depressed, Hopeless 0 0 0 1 0 1 1  PHQ - 2 Score 0 0 0 2 0 2 2    Review of Systems     Objective:   Physical Exam Vitals and nursing note reviewed.  Constitutional:      Appearance: Normal appearance.  Cardiovascular:     Rate and Rhythm: Normal rate and regular rhythm.     Pulses: Normal pulses.     Heart sounds: Normal heart sounds.  Pulmonary:     Effort: Pulmonary effort is normal.     Breath sounds: Normal breath sounds.  Musculoskeletal:     Comments: Normal Muscle Bulk and Muscle Testing Reveals:  Upper Extremities: Full ROM and Muscle Strength 5/5  Lumbar Paraspinal Tenderness: L-4-L-5 Right Greater Trochanter Tenderness Lower Extremities: Full ROM and Muscle Strength 5/5 Bilateral Lower Extremities Flexion Produces Pain into her Bilateral Patella's Arises from  Table slowly  Antalgic Gait  Transfer to Wheelchair     Skin:    General: Skin is warm and dry.  Neurological:     Mental Status: She is alert and oriented to person, place, and time.  Psychiatric:        Mood and Affect: Mood normal.        Behavior: Behavior normal.          Assessment & Plan:  1. Lumbar post laminectomy syndrome with severe kyphoscoliosis thoracolumbar spine, s/p lumbar fusion/  07/20/2023 Continue current medication regimen. Refilled:  Hydrocodone 10/325mg  one tablet every 8 hours #90. We will continue the opioid monitoring program, this consists of regular clinic visits, examinations, urine drug screen, pill counts as well as use of [place] Controlled Substance Reporting system. A 12 month History has been reviewed on the [place] Controlled Substance Reporting System on 07/20/2023 2. Right Hip OA: S/P Right Hip Replacement 10/28/2013. 07/20/2023 3.Depression: Continue Cymbalta. Has Family Support and Friends.  Counseling with Renato Gails. 07/20/2023 4. Bilateral Hip Pain/ Right Greater Trochanteric Tenderness: No complaints today. Continue to Monitor. Continue with Ice and Heat Therapy. 07/20/2023 5. Poor Appetite/ Frail: No complaints today. Ms. Spickler states PCP following. Continue to monitor. 07/20/2023 6. Chronic Left  Knee Pain: Continue HEP as Tolerated. Continue to Monitor. 07/20/2023 7. Chronic Right  shoulder pain:  No complaints today.Continue HEP as tolerated. Continue to Monitor. 07/20/2023   F/U in 1 month

## 2023-07-22 DIAGNOSIS — R0609 Other forms of dyspnea: Secondary | ICD-10-CM | POA: Diagnosis not present

## 2023-07-22 DIAGNOSIS — J4540 Moderate persistent asthma, uncomplicated: Secondary | ICD-10-CM | POA: Diagnosis not present

## 2023-07-23 LAB — DRUG TOX MONITOR 1 W/CONF, ORAL FLD
Amphetamines: NEGATIVE ng/mL (ref <10)
Barbiturates: NEGATIVE ng/mL (ref <10)
Benzodiazepines: NEGATIVE ng/mL (ref <0.50)
Buprenorphine: NEGATIVE ng/mL (ref <0.10)
Cocaine: NEGATIVE ng/mL (ref <5.0)
Codeine: NEGATIVE ng/mL (ref <2.5)
Dihydrocodeine: 25.6 ng/mL — ABNORMAL HIGH (ref <2.5)
Fentanyl: NEGATIVE ng/mL (ref <0.10)
Heroin Metabolite: NEGATIVE ng/mL (ref <1.0)
Hydrocodone: 191 ng/mL — ABNORMAL HIGH (ref <2.5)
Hydromorphone: NEGATIVE ng/mL (ref <2.5)
MARIJUANA: NEGATIVE ng/mL (ref <2.5)
MDMA: NEGATIVE ng/mL (ref <10)
Meprobamate: NEGATIVE ng/mL (ref <2.5)
Methadone: NEGATIVE ng/mL (ref <5.0)
Morphine: NEGATIVE ng/mL (ref <2.5)
Nicotine Metabolite: NEGATIVE ng/mL (ref <5.0)
Norhydrocodone: 9.2 ng/mL — ABNORMAL HIGH (ref <2.5)
Noroxycodone: NEGATIVE ng/mL (ref <2.5)
Opiates: POSITIVE ng/mL — AB (ref <2.5)
Oxycodone: NEGATIVE ng/mL (ref <2.5)
Oxymorphone: NEGATIVE ng/mL (ref <2.5)
Phencyclidine: NEGATIVE ng/mL (ref <10)
Tapentadol: NEGATIVE ng/mL (ref <5.0)
Tramadol: NEGATIVE ng/mL (ref <5.0)
Zolpidem: NEGATIVE ng/mL (ref <5.0)

## 2023-07-23 LAB — DRUG TOX ALC METAB W/CON, ORAL FLD: Alcohol Metabolite: NEGATIVE ng/mL (ref <25)

## 2023-07-25 IMAGING — DX DG CHEST 1V PORT
1 series · 1 of 1 positions shown · non-contrast
Comparison: 09/17/2021

CLINICAL DATA: Community acquired pneumonia.

EXAM:
PORTABLE CHEST 1 VIEW

[chest ap]
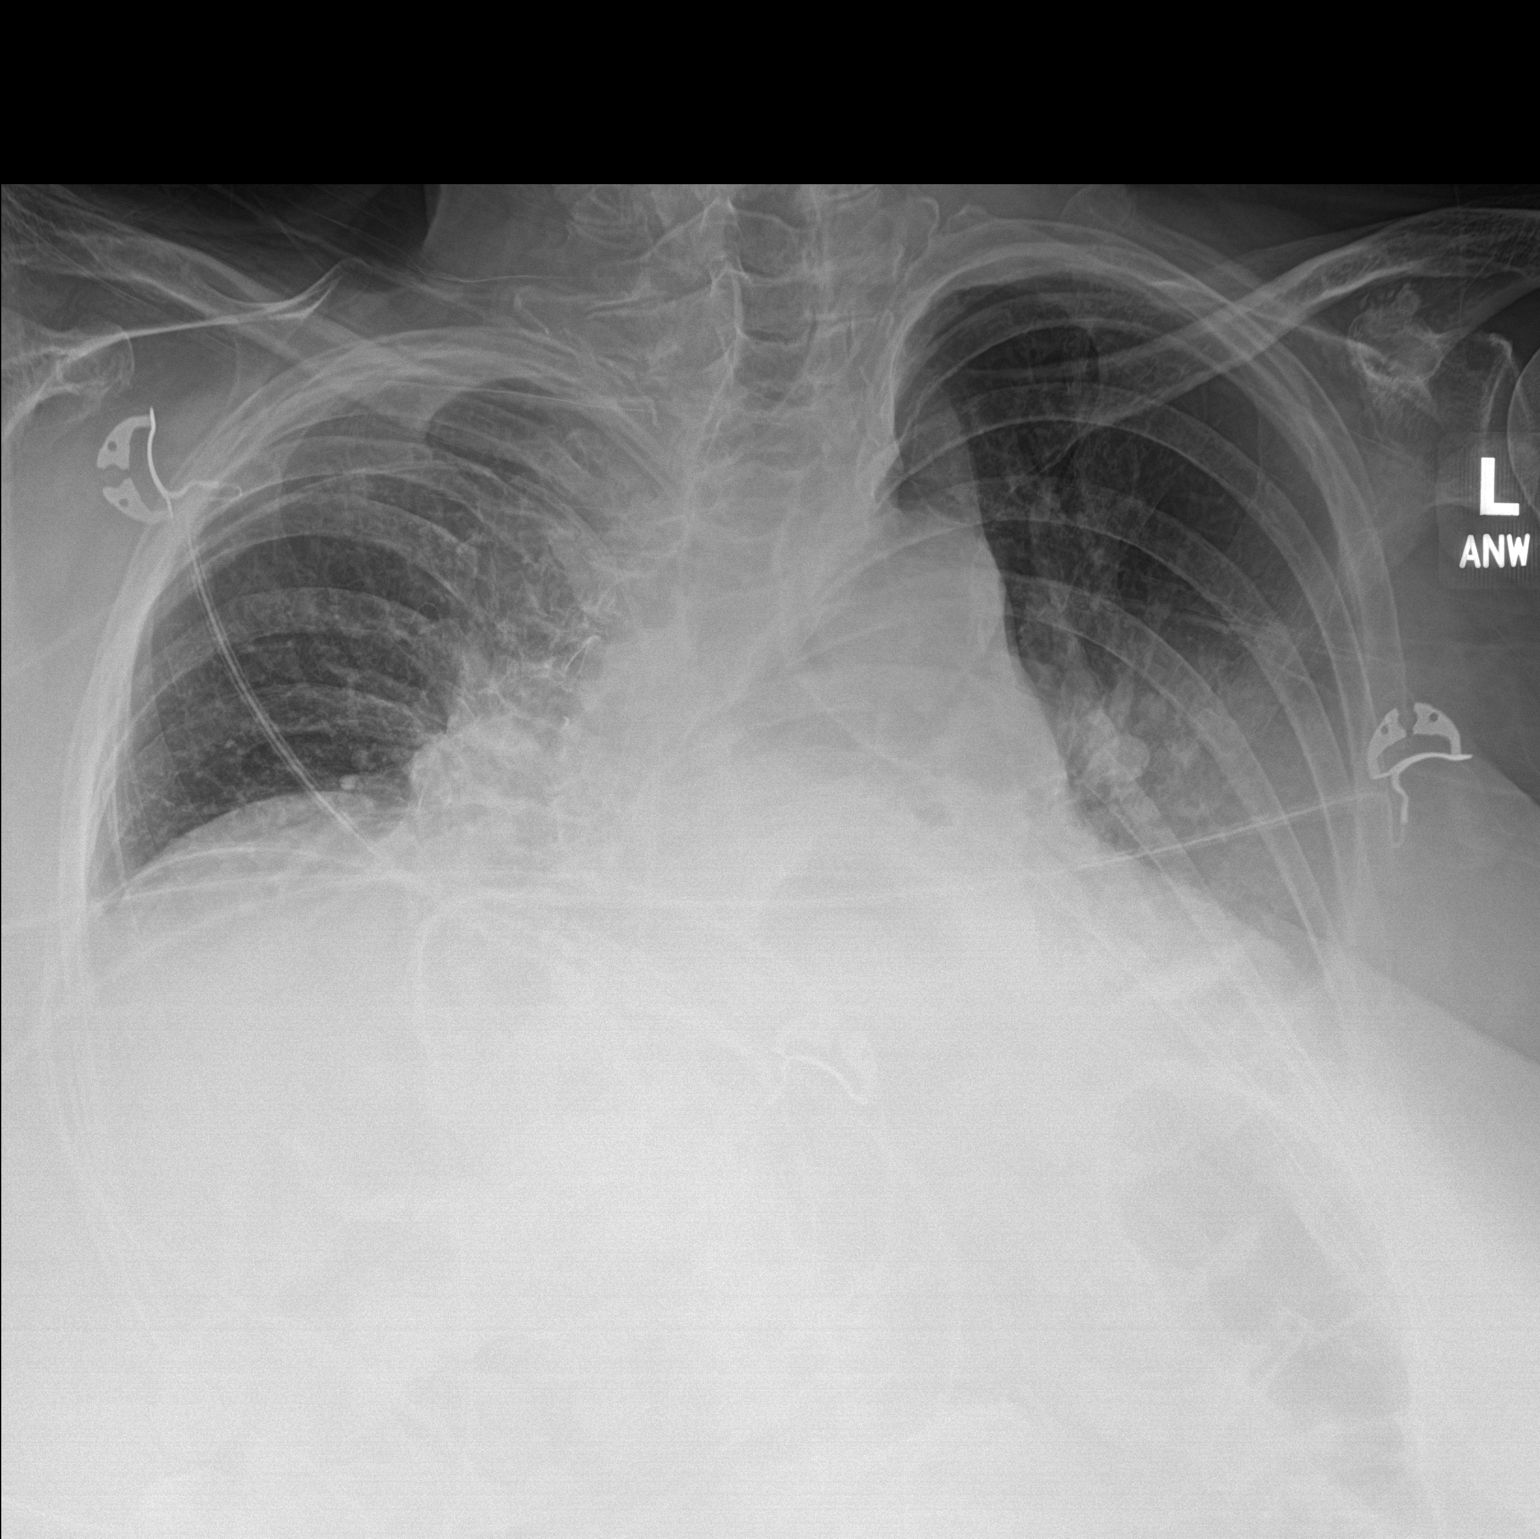

[1 of 1 positions shown; findings below may reference images not displayed]

FINDINGS: Poor inspiration. Grossly normal sized heart with an interval
decrease in size. Resolved right basilar airspace opacity. Increased
left basilar airspace opacity. Small bilateral pleural effusions,
decreased. Right PICC tip in the region of the upper right atrium,
approximately 2 cm inferior to the superior cavoatrial junction.
Marked dextroconvex thoracolumbar scoliosis.
IMPRESSION: 1. Resolved right basilar atelectasis.
2. Mildly decreased left basilar atelectasis and possible pneumonia.
3. Decreased bilateral pleural fluid.

## 2023-07-26 ENCOUNTER — Ambulatory Visit: Payer: Medicare Other | Admitting: Emergency Medicine

## 2023-07-26 ENCOUNTER — Encounter (HOSPITAL_COMMUNITY): Payer: Self-pay

## 2023-08-01 ENCOUNTER — Ambulatory Visit (INDEPENDENT_AMBULATORY_CARE_PROVIDER_SITE_OTHER)

## 2023-08-01 VITALS — Ht 60.0 in | Wt 130.0 lb

## 2023-08-01 DIAGNOSIS — Z0000 Encounter for general adult medical examination without abnormal findings: Secondary | ICD-10-CM

## 2023-08-01 NOTE — Patient Instructions (Addendum)
 Hannah Scott , Thank you for taking time to come for your Medicare Wellness Visit. I appreciate your ongoing commitment to your health goals. Please review the following plan we discussed and let me know if I can assist you in the future.   Referrals/Orders/Follow-Ups/Clinician Recommendations: It was nice talking with you today.  I am wishing you a very happy birthday coming up.  Each day, aim for 6 glasses of water, plenty of protein in your diet and try to get up and walk/ stretch every hour for 5-10 minutes at a time.    This is a list of the screening recommended for you and due dates:  Health Maintenance  Topic Date Due   Eye exam for diabetics  Never done   Zoster (Shingles) Vaccine (1 of 2) Never done   COVID-19 Vaccine (4 - 2024-25 season) 12/25/2022   Medicare Annual Wellness Visit  01/12/2023   Hemoglobin A1C  08/08/2023   Flu Shot  11/24/2023   Yearly kidney health urinalysis for diabetes  02/07/2024   Complete foot exam   02/07/2024   Yearly kidney function blood test for diabetes  04/16/2024   Mammogram  07/19/2024   DTaP/Tdap/Td vaccine (2 - Td or Tdap) 01/08/2029   Colon Cancer Screening  05/05/2031   Pneumonia Vaccine  Completed   Hepatitis C Screening  Completed   HPV Vaccine  Aged Out   DEXA scan (bone density measurement)  Discontinued    Advanced directives: (Copy Requested) Please bring a copy of your health care power of attorney and living will to the office to be added to your chart at your convenience. You can mail to [facility] [address] [place], [place] [address] or email to [email or web address]  Next Medicare Annual Wellness Visit scheduled for next year: Yes  Managing Pain Without Opioids Opioids are strong medicines used to treat moderate to severe pain. For some people, especially those who have long-term (chronic) pain, opioids may not be the best choice for pain management due to: Side effects like nausea, constipation, and  sleepiness. The risk of addiction (opioid use disorder). The longer you take opioids, the greater your risk of addiction. Pain that lasts for more than 3 months is called chronic pain. Managing chronic pain usually requires more than one approach and is often provided by a team of health care providers working together (multidisciplinary approach). Pain management may be done at a pain management center or pain clinic. How to manage pain without the use of opioids Use non-opioid medicines Non-opioid medicines for pain may include: Over-the-counter or prescription non-steroidal anti-inflammatory drugs (NSAIDs). These may be the first medicines used for pain. They work well for muscle and bone pain, and they reduce swelling. Acetaminophen. This over-the-counter medicine may work well for milder pain but not swelling. Antidepressants. These may be used to treat chronic pain. A certain type of antidepressant (tricyclics) is often used. These medicines are given in lower doses for pain than when used for depression. Anticonvulsants. These are usually used to treat seizures but may also reduce nerve (neuropathic) pain. Muscle relaxants. These relieve pain caused by sudden muscle tightening (spasms). You may also use a pain medicine that is applied to the skin as a patch, cream, or gel (topical analgesic), such as a numbing medicine. These may cause fewer side effects than medicines taken by mouth. Do certain therapies as directed Some therapies can help with pain management. They include: Physical therapy. You will do exercises to gain  strength and flexibility. A physical therapist may teach you exercises to move and stretch parts of your body that are weak, stiff, or painful. You can learn these exercises at physical therapy visits and practice them at home. Physical therapy may also involve: Massage. Heat wraps or applying heat or cold to affected areas. Electrical signals that interrupt pain signals  (transcutaneous electrical nerve stimulation, TENS). Weak lasers that reduce pain and swelling (low-level laser therapy). Signals from your body that help you learn to regulate pain (biofeedback). Occupational therapy. This helps you to learn ways to function at home and work with less pain. Recreational therapy. This involves trying new activities or hobbies, such as a physical activity or drawing. Mental health therapy, including: Cognitive behavioral therapy (CBT). This helps you learn coping skills for dealing with pain. Acceptance and commitment therapy (ACT) to change the way you think and react to pain. Relaxation therapies, including muscle relaxation exercises and mindfulness-based stress reduction. Pain management counseling. This may be individual, family, or group counseling.  Receive medical treatments Medical treatments for pain management include: Nerve block injections. These may include a pain blocker and anti-inflammatory medicines. You may have injections: Near the spine to relieve chronic back or neck pain. Into joints to relieve back or joint pain. Into nerve areas that supply a painful area to relieve body pain. Into muscles (trigger point injections) to relieve some painful muscle conditions. A medical device placed near your spine to help block pain signals and relieve nerve pain or chronic back pain (spinal cord stimulation device). Acupuncture. Follow these instructions at home Medicines Take over-the-counter and prescription medicines only as told by your health care provider. If you are taking pain medicine, ask your health care providers about possible side effects to watch out for. Do not drive or use heavy machinery while taking prescription opioid pain medicine. Lifestyle  Do not use drugs or alcohol to reduce pain. If you drink alcohol, limit how much you have to: 0-1 drink a day for women who are not pregnant. 0-2 drinks a day for men. Know how much  alcohol is in a drink. In the [place] one drink equals one 12 oz bottle of beer (355 mL), one 5 oz glass of wine (148 mL), or one 1 oz glass of hard liquor (44 mL). Do not use any products that contain nicotine or tobacco. These products include cigarettes, chewing tobacco, and vaping devices, such as e-cigarettes. If you need help quitting, ask your health care provider. Eat a healthy diet and maintain a healthy weight. Poor diet and excess weight may make pain worse. Eat foods that are high in fiber. These include fresh fruits and vegetables, whole grains, and beans. Limit foods that are high in fat and processed sugars, such as fried and sweet foods. Exercise regularly. Exercise lowers stress and may help relieve pain. Ask your health care provider what activities and exercises are safe for you. If your health care provider approves, join an exercise class that combines movement and stress reduction. Examples include yoga and tai chi. Get enough sleep. Lack of sleep may make pain worse. Lower stress as much as possible. Practice stress reduction techniques as told by your therapist. General instructions Work with all your pain management providers to find the treatments that work best for you. You are an important member of your pain management team. There are many things you can do to reduce pain on your own. Consider joining an online or in-person support group for people  who have chronic pain. Keep all follow-up visits. This is important. Where to find more information You can find more information about managing pain without opioids from: American Academy of Pain Medicine: painmed.org Institute for Chronic Pain: instituteforchronicpain.org American Chronic Pain Association: theacpa.org Contact a health care provider if: You have side effects from pain medicine. Your pain gets worse or does not get better with treatments or home therapy. You are struggling with anxiety or  depression. Summary Many types of pain can be managed without opioids. Chronic pain may respond better to pain management without opioids. Pain is best managed when you and a team of health care providers work together. Pain management without opioids may include non-opioid medicines, medical treatments, physical therapy, mental health therapy, and lifestyle changes. Tell your health care providers if your pain gets worse or is not being managed well enough. This information is not intended to replace advice given to you by your health care provider. Make sure you discuss any questions you have with your health care provider. Document Revised: 07/22/2020 Document Reviewed: 07/22/2020 Elsevier Patient Education  2024 [employer].

## 2023-08-01 NOTE — Progress Notes (Signed)
 Subjective:   Hannah Scott is a 73 y.o. who presents for a Medicare Wellness preventive visit.  Visit Complete: Virtual I connected with  Hannah Scott on 08/01/23 by a audio enabled telemedicine application and verified that I am speaking with the correct person using two identifiers.  Patient Location: Home  Provider Location: Home Office  I discussed the limitations of evaluation and management by telemedicine. The patient expressed understanding and agreed to proceed.  Vital Signs: Because this visit was a virtual/telehealth visit, some criteria may be missing or patient reported. Any vitals not documented were not able to be obtained and vitals that have been documented are patient reported.  VideoDeclined- This patient declined Librarian, academic. Therefore the visit was completed with audio only.  Persons Participating in Visit: Patient.  AWV Questionnaire: No: Patient Medicare AWV questionnaire was not completed prior to this visit.  Cardiac Risk Factors include: advanced age (>42men, >3 women);hypertension;dyslipidemia;Other (see comment), Risk factor comments: Stage 3b chronic kidney disease, CAD, NSTEMI, CHF     Objective:    Today's Vitals   08/01/23 1535 08/01/23 1538  Weight: 130 lb (59 kg)   Height: 5' (1.524 m)   PainSc:  6    Body mass index is 25.39 kg/m.     08/01/2023    3:49 PM 03/28/2023    7:52 AM 01/11/2022    2:15 PM 05/25/2021    2:28 PM 05/04/2021    6:58 AM 10/08/2020    1:42 PM 09/11/2019    1:32 PM  Advanced Directives  Does Patient Have a Medical Advance Directive? Yes Yes Yes No No No No  Type of Estate agent of [place];Living will Healthcare Power of [place];Living will Healthcare Power of [place];Living will      Copy of Healthcare Power of Attorney in Chart? No - copy requested No - copy requested No - copy requested      Would patient like information on creating a medical advance  directive?     No - Patient declined Yes (MAU/Ambulatory/Procedural Areas - Information given) No - Patient declined    Current Medications (verified) Outpatient Encounter Medications as of 08/01/2023  Medication Sig   albuterol (VENTOLIN HFA) 108 (90 Base) MCG/ACT inhaler Inhale 2 puffs into the lungs every 4 (four) hours as needed for wheezing or shortness of breath.   aspirin EC 81 MG tablet Take 1 tablet (81 mg total) by mouth daily. Swallow whole.   atorvastatin (LIPITOR) 20 MG tablet Take 1 tablet (20 mg total) by mouth daily.   bisoprolol (ZEBETA) 5 MG tablet TAKE 1 TABLET BY MOUTH DAILY   clobetasol cream (TEMOVATE) 0.05 % Apply 1 Application topically as needed.   DULoxetine (CYMBALTA) 30 MG capsule TAKE ONE CAPSULE EVERY DAY IN ADDITION TO 60MG  CAPSULE FOR A TOTAL OF 90MG  DAILY   DULoxetine (CYMBALTA) 60 MG capsule TAKE ONE CAPSULE BY MOUTH EVERY DAY TAKE ALONG WITH 30MG  CAPSULE. TOTAL DAILY DOSE is 90MG .   famotidine (PEPCID) 20 MG tablet TAKE ONE TABLET EVERY DAY AFTER SUPPER   fluticasone furoate-vilanterol (BREO ELLIPTA) 100-25 MCG/ACT AEPB INHALE 1 PUFF INTO THE LUNGS DAILY   furosemide (LASIX) 20 MG tablet Take 2 tablets (40 mg total) by mouth daily.   gabapentin (NEURONTIN) 600 MG tablet TAKE ONE TABLET BY MOUTH EVERY DAY AT BEDTIME   HYDROcodone-acetaminophen (NORCO) 10-325 MG tablet Take 1 tablet by mouth 3 (three) times daily as needed. Do Not Fill Before 08/04/2023  ipratropium-albuterol (DUONEB) 0.5-2.5 (3) MG/3ML SOLN Take 3 mLs by nebulization every 6 (six) hours as needed (wheezing or SOB). During exacerbation   irbesartan (AVAPRO) 75 MG tablet TAKE 1 TABLET(75 MG) BY MOUTH DAILY   levothyroxine (SYNTHROID) 100 MCG tablet TAKE 1 TABLET(100 MCG) BY MOUTH DAILY   macitentan (OPSUMIT) 10 MG tablet Take 1 tablet (10 mg total) by mouth daily.   tadalafil, PAH, (ADCIRCA) 20 MG tablet Take 1 tablet (20 mg total) by mouth daily.   No facility-administered encounter medications  on file as of 08/01/2023.    Allergies (verified) Fluticasone-salmeterol, Norvasc [amlodipine besylate], Tape, Heparin, and Morphine   History: Past Medical History:  Diagnosis Date   Anemia    hx   Arthritis    Asthma    PFTs, February, 2011, moderate obstructive disease with response to bronchodilators, normal lung volumes, moderate reduction in diffusing capacity   Atrial septal aneurysm    Echo, 2008-not noted on 13 echo   CAD (coronary artery disease)    90% distal LAD in the past  /   nuclear, 2008, no ischemia, ejection fraction 70%   Cancer (HCC) 2002   DUCTAL CIS--S/P LUMPECTOMY, RADIATION AND 6 WEEKS OF TAMOXIFEN   D-dimer, elevated    January, 2014   Depression    Ejection fraction    EF 60%, echo, October, 2008   Elevated CPK    January, 2014   Endometriosis 1989   RIGHT TUBE   Endometriosis 1987   LEFT TUBE/OVARY W FOCAL IN-SITU ENDOMETRIAL ADENOCARCINOMA   GERD (gastroesophageal reflux disease)    occ   H/O hiatal hernia    ?   Hyperlipidemia    Hypertension    Hypothyroidism    Patient has had in the past that she does not need treatment   Kyphoscoliosis    Obstructive airway disease (HCC)    Pinched nerve    lower back   Shortness of breath    Echo 2/22: EF 60-65, no RWMA, mild LVH, GR 1  DD, GLS -24%, normal RVSF, mild MR, trivial AI, borderline dilation of aortic root (39 mm)   UTI (lower urinary tract infection)    Past Surgical History:  Procedure Laterality Date   ANKLE SURGERY Left    ligament   APPENDECTOMY  1987   AT [facility]   BACK SURGERY     Fusion   BREAST LUMPECTOMY  2003   radiation on right   BREAST LUMPECTOMY WITH RADIOACTIVE SEED LOCALIZATION Left 02/12/2016   Procedure: LEFT BREAST LUMPECTOMY WITH RADIOACTIVE SEED LOCALIZATION;  Surgeon: Glenna Fellows, MD;  Location:  SURGERY CENTER;  Service: General;  Laterality: Left;   CARDIOVASCULAR STRESS TEST  09/07/05   Nuclear, was negative   CATARACT EXTRACTION Bilateral  01,03   COLONOSCOPY WITH PROPOFOL N/A 05/04/2021   Procedure: COLONOSCOPY WITH PROPOFOL;  Surgeon: Iva Boop, MD;  Location: [facility];  Service: Endoscopy;  Laterality: N/A;   ESOPHAGOGASTRODUODENOSCOPY (EGD) WITH PROPOFOL N/A 05/04/2021   Procedure: ESOPHAGOGASTRODUODENOSCOPY (EGD) WITH PROPOFOL;  Surgeon: Iva Boop, MD;  Location: [facility];  Service: Endoscopy;  Laterality: N/A;   OOPHORECTOMY     LSO -RSO   PELVIC LAPAROSCOPY  1989   RSO, LYSIS OF ADHESIONS   POLYPECTOMY  05/04/2021   Procedure: POLYPECTOMY;  Surgeon: Iva Boop, MD;  Location: [facility];  Service: Endoscopy;;   RIGHT/LEFT HEART CATH AND CORONARY ANGIOGRAPHY N/A 03/28/2023   Procedure: RIGHT/LEFT HEART CATH AND CORONARY ANGIOGRAPHY;  Surgeon: Marca Ancona  S, MD;  Location: [facility];  Service: Cardiovascular;  Laterality: N/A;   TOTAL ABDOMINAL HYSTERECTOMY  1987   LSO, APPENDECTOMY   TOTAL HIP ARTHROPLASTY Right 10/28/2013   dr Ophelia Charter   TOTAL HIP ARTHROPLASTY Right 10/28/2013   Procedure: TOTAL HIP ARTHROPLASTY ANTERIOR APPROACH;  Surgeon: Eldred Manges, MD;  Location: [facility];  Service: Orthopedics;  Laterality: Right;  Right Total Hip Arthroplasty, Direct Anterior Approach   Family History  Problem Relation Age of Onset   Heart failure Mother    Pneumonia Mother    Hypertension Mother    Heart disease Mother    Stroke Maternal Grandfather    Hypertension Maternal Grandfather    Heart attack Father    Heart disease Father    Asthma Paternal Uncle        PAT UNCLES   Heart attack Paternal Uncle    Social History   Socioeconomic History   Marital status: Widowed    Spouse name: Not on file   Number of children: Not on file   Years of education: Not on file   Highest education level: Not on file  Occupational History   Occupation: RETIRED  Tobacco Use   Smoking status: Never    Passive exposure: Never   Smokeless tobacco: Never  Vaping Use   Vaping status: Never Used   Substance and Sexual Activity   Alcohol use: No    Alcohol/week: 0.0 standard drinks of alcohol   Drug use: No   Sexual activity: Not Currently    Partners: Male    Birth control/protection: Surgical  Other Topics Concern   Not on file  Social History Narrative   Widowed in 2015   2 sons 1 lives in the area the other is in close contact /2025   1 grandchild   Never smoker no drug use no alcohol   Social Drivers of Corporate investment banker Strain: Medium Risk (08/01/2023)   Overall Financial Resource Strain (CARDIA)    Difficulty of Paying Living Expenses: Somewhat hard  Food Insecurity: No Food Insecurity (08/01/2023)   Hunger Vital Sign    Worried About Running Out of Food in the Last Year: Never true    Ran Out of Food in the Last Year: Never true  Transportation Needs: No Transportation Needs (08/01/2023)   PRAPARE - Administrator, Civil Service (Medical): No    Lack of Transportation (Non-Medical): No  Physical Activity: Inactive (08/01/2023)   Exercise Vital Sign    Days of Exercise per Week: 0 days    Minutes of Exercise per Session: 0 min  Stress: Stress Concern Present (08/01/2023)   [employer] of Occupational Health - Occupational Stress Questionnaire    Feeling of Stress : To some extent  Social Connections: Socially Isolated (08/01/2023)   Social Connection and Isolation Panel [NHANES]    Frequency of Communication with Friends and Family: More than three times a week    Frequency of Social Gatherings with Friends and Family: Once a week    Attends Religious Services: Never    Database administrator or Organizations: No    Attends Banker Meetings: Never    Marital Status: Widowed    Tobacco Counseling Counseling given: Not Answered    Clinical Intake:  Pre-visit preparation completed: Yes  Pain : 0-10 Pain Score: 6  Pain Type: Chronic pain Pain Location: Back (both knees) Pain Orientation: Lower Pain Descriptors /  Indicators: Aching, Discomfort Pain  Onset: More than a month ago Pain Frequency: Constant Pain Relieving Factors: hydrocodone, Gabapentin  Pain Relieving Factors: hydrocodone, Gabapentin  BMI - recorded: 25.39 Nutritional Status: BMI 25 -29 Overweight Nutritional Risks: None Diabetes: Yes CBG done?: No Did pt. bring in CBG monitor from home?: No  Lab Results  Component Value Date   HGBA1C 6.2 02/07/2023   HGBA1C 6.0 07/25/2022   HGBA1C 6.0 01/20/2022     How often do you need to have someone help you when you read instructions, pamphlets, or other written materials from your doctor or pharmacy?: 1 - Never  Interpreter Needed?: No  Information entered by :: Jamyah Folk, RMA   Activities of Daily Living     08/01/2023    3:39 PM  In your present state of health, do you have any difficulty performing the following activities:  Hearing? 0  Vision? 0  Difficulty concentrating or making decisions? 0  Walking or climbing stairs? 0  Dressing or bathing? 0  Doing errands, shopping? 0  Preparing Food and eating ? N  Using the Toilet? N  In the past six months, have you accidently leaked urine? Y  Do you have problems with loss of bowel control? N  Managing your Medications? N  Managing your Finances? N  Housekeeping or managing your Housekeeping? N    Patient Care Team: Pincus Sanes, MD as PCP - General (Internal Medicine) Meriam Sprague, MD (Inactive) as PCP - Cardiology (Cardiology) Carmela Rima, MD as Consulting Physician (Ophthalmology) Nyoka Cowden, MD as Consulting Physician (Pulmonary Disease) Leslye Peer, MD as Consulting Physician (Pulmonary Disease) Elise Benne, MD as Consulting Physician (Ophthalmology)  Indicate any recent Medical Services you may have received from other than [facility] providers in the past year (date may be approximate).     Assessment:   This is a routine wellness examination for Hannah Scott.  Hearing/Vision screen Hearing  Screening - Comments:: Denies hearing difficulties   Vision Screening - Comments:: Wears eyeglasses when reading   Goals Addressed             This Visit's Progress    Stay as healthy and as independent as possible   On track      Depression Screen     08/01/2023    3:51 PM 06/20/2023    1:37 PM 04/14/2023   10:53 AM 03/16/2023    2:16 PM 01/03/2023    2:31 PM 11/22/2022    3:06 PM 10/21/2022    2:10 PM  PHQ 2/9 Scores  PHQ - 2 Score 5 0 0 0 2 0 2  PHQ- 9 Score 11          Fall Risk     08/01/2023    3:50 PM 06/20/2023    1:37 PM 04/14/2023   10:53 AM 03/20/2023    2:09 PM 03/16/2023    2:16 PM  Fall Risk   Falls in the past year? 0 0 0 0 0  Number falls in past yr: 0  0    Injury with Fall? 0  0    Risk for fall due to : No Fall Risks      Follow up Falls prevention discussed;Falls evaluation completed        MEDICARE RISK AT HOME:  Medicare Risk at Home Any stairs in or around the home?: Yes If so, are there any without handrails?: Yes Home free of loose throw rugs in walkways, pet beds, electrical cords, etc?: Yes Adequate lighting in  your home to reduce risk of falls?: Yes Life alert?: No Use of a cane, walker or w/c?: Yes Grab bars in the bathroom?: Yes Shower chair or bench in shower?: Yes Elevated toilet seat or a handicapped toilet?: Yes  TIMED UP AND GO:  Was the test performed?  No  Cognitive Function: Declined: Patient declined cognitive screening, but was able to answer questions in an accurate and timely manner. No cognitive impairments observed.    01/10/2017    3:39 PM  MMSE - Mini Mental State Exam  Orientation to time 5  Orientation to Place 5  Registration 3  Attention/ Calculation 5  Recall 2  Language- name 2 objects 2  Language- repeat 1  Language- follow 3 step command 3  Language- read & follow direction 1  Write a sentence 1  Copy design 1  Total score 29        01/11/2022    2:32 PM 09/11/2019    1:35 PM  6CIT Screen   What Year? 0 points 0 points  What month? 0 points 0 points  What time? 0 points 3 points  Count back from 20 0 points 0 points  Months in reverse 0 points 0 points  Repeat phrase 0 points 0 points  Total Score 0 points 3 points    Immunizations Immunization History  Administered Date(s) Administered   Fluad Quad(high Dose 65+) 01/04/2019, 01/20/2022   Fluad Trivalent(High Dose 65+) 02/07/2023   Influenza Split 03/23/2011, 02/15/2012   Influenza Whole 02/12/2003, 12/24/2009   Influenza, High Dose Seasonal PF 01/06/2016, 01/10/2017, 03/16/2018   Influenza,inj,Quad PF,6+ Mos 01/29/2013, 01/03/2014, 02/11/2015   Influenza-Unspecified 04/09/2021   Moderna Covid-19 Vaccine Bivalent Booster 25yrs & up 03/26/2021   Moderna SARS-COV2 Booster Vaccination 04/10/2020   Moderna Sars-Covid-2 Vaccination 06/21/2019, 07/24/2019   Pneumococcal Conjugate-13 05/06/2015   Pneumococcal Polysaccharide-23 07/06/2016   Tdap 01/09/2019    Screening Tests Health Maintenance  Topic Date Due   OPHTHALMOLOGY EXAM  Never done   Zoster Vaccines- Shingrix (1 of 2) Never done   COVID-19 Vaccine (4 - 2024-25 season) 12/25/2022   Medicare Annual Wellness (AWV)  01/12/2023   HEMOGLOBIN A1C  08/08/2023   INFLUENZA VACCINE  11/24/2023   Diabetic kidney evaluation - Urine ACR  02/07/2024   FOOT EXAM  02/07/2024   Diabetic kidney evaluation - eGFR measurement  04/16/2024   MAMMOGRAM  07/19/2024   DTaP/Tdap/Td (2 - Td or Tdap) 01/08/2029   Colonoscopy  05/05/2031   Pneumonia Vaccine 53+ Years old  Completed   Hepatitis C Screening  Completed   HPV VACCINES  Aged Out   DEXA SCAN  Discontinued    Health Maintenance  Health Maintenance Due  Topic Date Due   OPHTHALMOLOGY EXAM  Never done   Zoster Vaccines- Shingrix (1 of 2) Never done   COVID-19 Vaccine (4 - 2024-25 season) 12/25/2022   Medicare Annual Wellness (AWV)  01/12/2023   Health Maintenance Items Addressed: See Nurse Notes  Additional  Screening:  Vision Screening: Recommended annual ophthalmology exams for early detection of glaucoma and other disorders of the eye.  Dental Screening: Recommended annual dental exams for proper oral hygiene  Community Resource Referral / Chronic Care Management: CRR required this visit?  No   CCM required this visit?  No     Plan:     I have personally reviewed and noted the following in the patient's chart:   Medical and social history Use of alcohol, tobacco or illicit drugs  Current  medications and supplements including opioid prescriptions. Patient is currently taking opioid prescriptions. Information provided to patient regarding non-opioid alternatives. Patient advised to discuss non-opioid treatment plan with their provider. Functional ability and status Nutritional status Physical activity Advanced directives List of other physicians Hospitalizations, surgeries, and ER visits in previous 12 months Vitals Screenings to include cognitive, depression, and falls Referrals and appointments  In addition, I have reviewed and discussed with patient certain preventive protocols, quality metrics, and best practice recommendations. A written personalized care plan for preventive services as well as general preventive health recommendations were provided to patient.     Kassity Woodson L Zakyra [place], CMA   08/01/2023   After Visit Summary: (MyChart) Due to this being a telephonic visit, the after visit summary with patients personalized plan was offered to patient via MyChart   Notes: Please refer to Routing Comments.

## 2023-08-02 ENCOUNTER — Telehealth (HOSPITAL_COMMUNITY): Payer: Self-pay | Admitting: Cardiology

## 2023-08-02 DIAGNOSIS — E278 Other specified disorders of adrenal gland: Secondary | ICD-10-CM

## 2023-08-02 NOTE — Telephone Encounter (Signed)
 Patient called.  Patient aware.    Reports she is established with Dr Delton Coombes at [facility], will forward results for upcoming appt.  Order placed for CT

## 2023-08-02 NOTE — Telephone Encounter (Signed)
-----   Message from Marca Ancona sent at 07/26/2023  5:08 PM EDT ----- No fibrotic ILD.  Small airways disease, "cannot exclude" bronchiolitis obliterans.  Would refer for pulmonary evaluation.  Right adrenal mass => need CT abdomen with and without contrast to further evaluate.

## 2023-08-07 ENCOUNTER — Encounter: Payer: Self-pay | Admitting: Internal Medicine

## 2023-08-07 NOTE — Progress Notes (Unsigned)
 Subjective:    Patient ID: Hannah Scott, female    DOB: [birth year], 73 y.o.   MRN: [id number]     HPI Miette is here for follow up of her chronic medical problems.  ? Taking B12  Need A1c - ? Labs or not  Medications and allergies reviewed with patient and updated if appropriate.  Current Outpatient Medications on File Prior to Visit  Medication Sig Dispense Refill   albuterol (VENTOLIN HFA) 108 (90 Base) MCG/ACT inhaler Inhale 2 puffs into the lungs every 4 (four) hours as needed for wheezing or shortness of breath. 1 each 11   aspirin EC 81 MG tablet Take 1 tablet (81 mg total) by mouth daily. Swallow whole. 90 tablet 3   atorvastatin (LIPITOR) 20 MG tablet Take 1 tablet (20 mg total) by mouth daily. 90 tablet 3   bisoprolol (ZEBETA) 5 MG tablet TAKE 1 TABLET BY MOUTH DAILY 90 tablet 1   clobetasol cream (TEMOVATE) 0.05 % Apply 1 Application topically as needed.     DULoxetine (CYMBALTA) 30 MG capsule TAKE ONE CAPSULE EVERY DAY IN ADDITION TO 60MG  CAPSULE FOR A TOTAL OF 90MG  DAILY 90 capsule 1   DULoxetine (CYMBALTA) 60 MG capsule TAKE ONE CAPSULE BY MOUTH EVERY DAY TAKE ALONG WITH 30MG  CAPSULE. TOTAL DAILY DOSE is 90MG . 90 capsule 1   famotidine (PEPCID) 20 MG tablet TAKE ONE TABLET EVERY DAY AFTER SUPPER 90 tablet 2   fluticasone furoate-vilanterol (BREO ELLIPTA) 100-25 MCG/ACT AEPB INHALE 1 PUFF INTO THE LUNGS DAILY 60 each 11   furosemide (LASIX) 20 MG tablet Take 2 tablets (40 mg total) by mouth daily. 180 tablet 3   gabapentin (NEURONTIN) 600 MG tablet TAKE ONE TABLET BY MOUTH EVERY DAY AT BEDTIME 90 tablet 1   HYDROcodone-acetaminophen (NORCO) 10-325 MG tablet Take 1 tablet by mouth 3 (three) times daily as needed. Do Not Fill Before 08/04/2023 90 tablet 0   ipratropium-albuterol (DUONEB) 0.5-2.5 (3) MG/3ML SOLN Take 3 mLs by nebulization every 6 (six) hours as needed (wheezing or SOB). During exacerbation 120 mL 3   irbesartan (AVAPRO) 75 MG tablet TAKE 1 TABLET(75 MG)  BY MOUTH DAILY 90 tablet 1   levothyroxine (SYNTHROID) 100 MCG tablet TAKE 1 TABLET(100 MCG) BY MOUTH DAILY 90 tablet 3   macitentan (OPSUMIT) 10 MG tablet Take 1 tablet (10 mg total) by mouth daily.     tadalafil, PAH, (ADCIRCA) 20 MG tablet Take 1 tablet (20 mg total) by mouth daily. 30 tablet 11   No current facility-administered medications on file prior to visit.     Review of Systems     Objective:  There were no vitals filed for this visit. BP Readings from Last 3 Encounters:  07/20/23 105/66  06/12/23 124/72  05/22/23 130/80   Wt Readings from Last 3 Encounters:  08/01/23 130 lb (59 kg)  07/20/23 135 lb 3.2 oz (61.3 kg)  06/20/23 131 lb (59.4 kg)   There is no height or weight on file to calculate BMI.    Physical Exam     Lab Results  Component Value Date   WBC 7.0 03/13/2023   HGB 14.3 03/28/2023   HCT 42.0 03/28/2023   PLT 186 03/13/2023   GLUCOSE 95 04/17/2023   CHOL 127 02/07/2023   TRIG 67.0 02/07/2023   HDL 58.10 02/07/2023   LDLDIRECT 154.8 02/20/2009   LDLCALC 56 02/07/2023   ALT 30 02/07/2023   AST 34 02/07/2023   NA 136  04/17/2023   K 3.9 04/17/2023   CL 96 (L) 04/17/2023   CREATININE 0.86 04/17/2023   BUN 21 04/17/2023   CO2 33 (H) 04/17/2023   TSH 0.59 02/07/2023   INR 1.2 09/18/2021   HGBA1C 6.2 02/07/2023   MICROALBUR 9.4 (H) 02/07/2023     Assessment & Plan:    See Problem List for Assessment and Plan of chronic medical problems.

## 2023-08-07 NOTE — Patient Instructions (Addendum)
   Have blood work today.    You had a B12 injection today    Medications changes include :   Start B12 1000 mcg drops under your tongue daily       Return in about 6 months (around 02/07/2024) for Physical Exam.

## 2023-08-08 ENCOUNTER — Ambulatory Visit: Payer: Medicare Other | Admitting: Internal Medicine

## 2023-08-08 VITALS — BP 120/62 | HR 60 | Temp 98.3°F | Resp 18 | Ht 60.0 in | Wt 130.0 lb

## 2023-08-08 DIAGNOSIS — E538 Deficiency of other specified B group vitamins: Secondary | ICD-10-CM

## 2023-08-08 DIAGNOSIS — E119 Type 2 diabetes mellitus without complications: Secondary | ICD-10-CM | POA: Diagnosis not present

## 2023-08-08 DIAGNOSIS — E7849 Other hyperlipidemia: Secondary | ICD-10-CM | POA: Diagnosis not present

## 2023-08-08 DIAGNOSIS — I5032 Chronic diastolic (congestive) heart failure: Secondary | ICD-10-CM

## 2023-08-08 DIAGNOSIS — E039 Hypothyroidism, unspecified: Secondary | ICD-10-CM | POA: Diagnosis not present

## 2023-08-08 DIAGNOSIS — I10 Essential (primary) hypertension: Secondary | ICD-10-CM

## 2023-08-08 DIAGNOSIS — N1832 Chronic kidney disease, stage 3b: Secondary | ICD-10-CM

## 2023-08-08 DIAGNOSIS — F3289 Other specified depressive episodes: Secondary | ICD-10-CM

## 2023-08-08 DIAGNOSIS — M48062 Spinal stenosis, lumbar region with neurogenic claudication: Secondary | ICD-10-CM

## 2023-08-08 DIAGNOSIS — K219 Gastro-esophageal reflux disease without esophagitis: Secondary | ICD-10-CM

## 2023-08-08 LAB — HEMOGLOBIN A1C: Hgb A1c MFr Bld: 5.5 % (ref 4.6–6.5)

## 2023-08-08 LAB — TSH: TSH: 0.34 u[IU]/mL — ABNORMAL LOW (ref 0.35–5.50)

## 2023-08-08 LAB — COMPREHENSIVE METABOLIC PANEL WITH GFR
ALT: 11 U/L (ref 0–35)
AST: 20 U/L (ref 0–37)
Albumin: 4.1 g/dL (ref 3.5–5.2)
Alkaline Phosphatase: 92 U/L (ref 39–117)
BUN: 19 mg/dL (ref 6–23)
CO2: 36 meq/L — ABNORMAL HIGH (ref 19–32)
Calcium: 8.9 mg/dL (ref 8.4–10.5)
Chloride: 97 meq/L (ref 96–112)
Creatinine, Ser: 0.68 mg/dL (ref 0.40–1.20)
GFR: 86.04 mL/min (ref >60.00)
Glucose, Bld: 90 mg/dL (ref 70–99)
Potassium: 3.8 meq/L (ref 3.5–5.1)
Sodium: 137 meq/L (ref 135–145)
Total Bilirubin: 0.6 mg/dL (ref 0.2–1.2)
Total Protein: 6 g/dL (ref 6.0–8.3)

## 2023-08-08 LAB — LIPID PANEL
Cholesterol: 130 mg/dL (ref 0–200)
HDL: 61.9 mg/dL (ref >39.00)
LDL Cholesterol: 57 mg/dL (ref 0–99)
NonHDL: 68.37
Total CHOL/HDL Ratio: 2
Triglycerides: 57 mg/dL (ref 0.0–149.0)
VLDL: 11.4 mg/dL (ref 0.0–40.0)

## 2023-08-08 LAB — CBC WITH DIFFERENTIAL/PLATELET
Basophils Absolute: 0 10*3/uL (ref 0.0–0.1)
Basophils Relative: 0.6 % (ref 0.0–3.0)
Eosinophils Absolute: 0.2 10*3/uL (ref 0.0–0.7)
Eosinophils Relative: 6.9 % — ABNORMAL HIGH (ref 0.0–5.0)
HCT: 36.3 % (ref 36.0–46.0)
Hemoglobin: 11.8 g/dL — ABNORMAL LOW (ref 12.0–15.0)
Lymphocytes Relative: 19 % (ref 12.0–46.0)
Lymphs Abs: 0.7 10*3/uL (ref 0.7–4.0)
MCHC: 32.5 g/dL (ref 30.0–36.0)
MCV: 95.2 fl (ref 78.0–100.0)
Monocytes Absolute: 0.3 10*3/uL (ref 0.1–1.0)
Monocytes Relative: 8.6 % (ref 3.0–12.0)
Neutro Abs: 2.3 10*3/uL (ref 1.4–7.7)
Neutrophils Relative %: 64.9 % (ref 43.0–77.0)
Platelets: 147 10*3/uL — ABNORMAL LOW (ref 150.0–400.0)
RBC: 3.81 Mil/uL — ABNORMAL LOW (ref 3.87–5.11)
RDW: 13.5 % (ref 11.5–15.5)
WBC: 3.5 10*3/uL — ABNORMAL LOW (ref 4.0–10.5)

## 2023-08-08 MED ORDER — CYANOCOBALAMIN 1000 MCG/ML IJ SOLN
1000.0000 ug | Freq: Once | INTRAMUSCULAR | Status: AC
  Administered 2023-08-08: 1000 ug via INTRAMUSCULAR

## 2023-08-08 NOTE — Assessment & Plan Note (Signed)
 Chronic Fairly controlled Continue duloxetine 90 mg daily

## 2023-08-08 NOTE — Assessment & Plan Note (Addendum)
 Chronic BP on low side side and she is symptomatic Taking bisoprolol 5 mg daily, also on tadalafil, lasix and macitentan Could decrease this to 2.5 mg but she sees Dr Mitzie Anda next week and she is ok with waiting until she sees him Will continue to monitor BP at home cmp

## 2023-08-08 NOTE — Assessment & Plan Note (Signed)
Chronic  Experiencing increased fatigue-concern for hypothyroid Check tsh and will titrate med dose if needed Currently taking levothyroxine 100 mcg daily

## 2023-08-08 NOTE — Assessment & Plan Note (Signed)
 Chronic Not taking B12 supplementation May be contributing to her fatigue B12 injection today Start B12 drops SL

## 2023-08-08 NOTE — Assessment & Plan Note (Signed)
 Chronic Check lipid panel  Continue atorvastatin 20 mg daily

## 2023-08-08 NOTE — Assessment & Plan Note (Signed)
Chronic Has been stable CBC, CMP

## 2023-08-08 NOTE — Assessment & Plan Note (Signed)
 Chronic Following with cardiology and HF clinic Currently taking Lasix 40 mg daily

## 2023-08-08 NOTE — Assessment & Plan Note (Signed)
Chronic Following with pain management Currently on hydrocodone-acetaminophen 10-325 mg 3 times daily as needed

## 2023-08-08 NOTE — Assessment & Plan Note (Signed)
Chronic GERD controlled Continue famotidine 20 mg daily 

## 2023-08-08 NOTE — Assessment & Plan Note (Signed)
 Chronic   Lab Results  Component Value Date   HGBA1C 5.5 08/08/2023   Sugars controlled Check A1c Jardiance stopped by cardio Lifestyle controlled

## 2023-08-10 ENCOUNTER — Other Ambulatory Visit: Payer: Self-pay | Admitting: Internal Medicine

## 2023-08-10 ENCOUNTER — Encounter: Payer: Self-pay | Admitting: Internal Medicine

## 2023-08-10 MED ORDER — LEVOTHYROXINE SODIUM 100 MCG PO TABS: ORAL_TABLET | ORAL | Status: DC

## 2023-08-11 ENCOUNTER — Telehealth (HOSPITAL_COMMUNITY): Payer: Self-pay | Admitting: Cardiology

## 2023-08-11 NOTE — Telephone Encounter (Signed)
 Called to confirm/remind patient of [facility] on [facility]11[facility]23 .   Appointment:   [x] Confirmed  [] Left mess   [] No answer/No voice mail  [] VM Full/unable to leave message  [] Phone not in service  Patient reminded to bring all medications and/or complete list.  Confirmed patient has transportation. Gave directions, instructed to utilize valet parking.

## 2023-08-14 ENCOUNTER — Other Ambulatory Visit: Payer: Self-pay

## 2023-08-14 ENCOUNTER — Ambulatory Visit (HOSPITAL_COMMUNITY)
Admission: RE | Admit: 2023-08-14 | Discharge: 2023-08-14 | Disposition: A | Discharge status: Home / Self Care | Source: Ambulatory Visit | Attending: Cardiology | Admitting: Cardiology

## 2023-08-14 ENCOUNTER — Encounter (HOSPITAL_COMMUNITY): Payer: Self-pay | Admitting: Cardiology

## 2023-08-14 VITALS — BP 124/70 | HR 85 | Wt 131.4 lb

## 2023-08-14 DIAGNOSIS — I2510 Atherosclerotic heart disease of native coronary artery without angina pectoris: Secondary | ICD-10-CM | POA: Diagnosis not present

## 2023-08-14 DIAGNOSIS — I7300 Raynaud's syndrome without gangrene: Secondary | ICD-10-CM | POA: Insufficient documentation

## 2023-08-14 DIAGNOSIS — Z853 Personal history of malignant neoplasm of breast: Secondary | ICD-10-CM | POA: Insufficient documentation

## 2023-08-14 DIAGNOSIS — M419 Scoliosis, unspecified: Secondary | ICD-10-CM | POA: Insufficient documentation

## 2023-08-14 DIAGNOSIS — E785 Hyperlipidemia, unspecified: Secondary | ICD-10-CM | POA: Diagnosis not present

## 2023-08-14 DIAGNOSIS — I272 Pulmonary hypertension, unspecified: Secondary | ICD-10-CM | POA: Diagnosis not present

## 2023-08-14 DIAGNOSIS — E2749 Other adrenocortical insufficiency: Secondary | ICD-10-CM | POA: Diagnosis not present

## 2023-08-14 DIAGNOSIS — I5032 Chronic diastolic (congestive) heart failure: Secondary | ICD-10-CM | POA: Insufficient documentation

## 2023-08-14 DIAGNOSIS — I252 Old myocardial infarction: Secondary | ICD-10-CM | POA: Insufficient documentation

## 2023-08-14 DIAGNOSIS — Z79899 Other long term (current) drug therapy: Secondary | ICD-10-CM | POA: Insufficient documentation

## 2023-08-14 DIAGNOSIS — I110 Hypertensive heart disease with heart failure: Secondary | ICD-10-CM | POA: Diagnosis not present

## 2023-08-14 LAB — BRAIN NATRIURETIC PEPTIDE: B Natriuretic Peptide: 145.4 pg/mL — ABNORMAL HIGH (ref 0.0–100.0)

## 2023-08-14 MED ORDER — TADALAFIL (PAH) 20 MG PO TABS: 40.0000 mg | ORAL_TABLET | Freq: Every day | ORAL | 11 refills | Status: AC

## 2023-08-14 NOTE — Progress Notes (Signed)
 6 Min Walk Test Completed  Pt ambulated 810ft (243.84 m) O2 Sat ranged 88%-93% on 2L oxygen  HR ranged 76-118

## 2023-08-14 NOTE — Patient Instructions (Addendum)
 INCREASE Tadalafil  to 40 mg daily.  YOU WILL BE CONTACTED ABOUT YOUR UPTRAVI ONCE APPROVED.  Labs done today, your results will be available in MyChart, we will contact you for abnormal readings.  Your physician recommends that you schedule a follow-up appointment in: 3 months (July) ** PLEASE CALL THE OFFICE IN MAY TO ARRANGE YOUR FOLLOW UP APPOINTMENT.**  If you have any questions or concerns before your next appointment please send us  a message through [place] or call our office at [phone].    TO LEAVE A MESSAGE FOR THE NURSE SELECT OPTION 2, PLEASE LEAVE A MESSAGE INCLUDING: YOUR NAME DATE OF BIRTH CALL BACK NUMBER REASON FOR CALL**this is important as we prioritize the call backs  YOU WILL RECEIVE A CALL BACK THE SAME DAY AS LONG AS YOU CALL BEFORE 4:00 PM  At the [facility], you and your health needs are our priority. As part of our continuing mission to provide you with exceptional heart care, we have created designated Provider Care Teams. These Care Teams include your primary Cardiologist (physician) and Advanced Practice Providers (APPs- Physician Assistants and Nurse Practitioners) who all work together to provide you with the care you need, when you need it.   You may see any of the following providers on your designated Care Team at your next follow up: Dr Jules Oar Dr Peder Bourdon Dr. Alwin Baars Dr. Arta Lark Amy Marijane Shoulders, NP Ruddy Corral, [place] [facility] [place], [place] Dennise Fitz, NP [place] Lee, NP Shawnee Dellen, NP Luster Salters, PharmD Bevely Brush, PharmD   Please be sure to bring in all your medications bottles to every appointment.    Thank you for choosing [facility]

## 2023-08-15 ENCOUNTER — Telehealth (HOSPITAL_COMMUNITY): Payer: Self-pay | Admitting: Pharmacist

## 2023-08-15 ENCOUNTER — Other Ambulatory Visit (HOSPITAL_COMMUNITY): Payer: Self-pay

## 2023-08-15 NOTE — Progress Notes (Signed)
 PCP: Colene Dauphin, MD HF Cardiology: Dr. Mitzie Anda  73 y.o. with history of pulmonary hypertension returns for followup of PH and RV failure.  Patient has a history of rheumatoid arthritis though not currently treated, kyphoscoliosis, prior breast cancer, NSTEMI in 1991 and 1998 thought to be due to vasospasm, and asthma since childhood. She had an echo in 12/23 showing normal LV EF 60-65%, mild RV enlargement with normal RV systolic function, PASP 48 mmHg.  She has joint pain in her hands and has Raynaud's phenomenon.  She is not currently on treatment for RA.  She has had a long history of asthma, sees Dr. Baldwin Levee for this. She wears oxygen  at night, rarely during the day.   She had LHC/RHC in 12/24, this showed no significant CAD and normal filling pressures but CI was mildly low at 2.1 and there was severe pulmonary arterial hypertension with PVR 10.4 WU.   Echo in 12/24 showed EF 70-75%, moderate LVH, D-shaped septum, mild RV dysfunction with mildly dilated RV, PASP 73 mmHg, mild-moderate MR, IVC dilated.   V/Q scan in 3/25 showed no evidence for chronic PE.  High resolution CT chest in 3/25 showed no interstitial lung disease but cannot exclude bronchiolitis obliterans, also noted was a stable calcified right adrenal mass.   She is now on Opsumit  and tadalafil  20 mg daily.  She stopped Jardiance  due to lightheadedness. She is using a walker.  Mild dyspnea walking into the office, does ok walking around the house.  Uses 2 L home oxygen  with exertion and at night. No dyspnea dressing but does get short of breath bathing. Chronically raises head of bed at night. No palpitations or lightheadedness, SBP 90s-100s when she checks at home.  No chest pain. She says that she does not feel much different on pulmonary hypertension medications but her 6 minute walk today was considerably longer than prior.  Weight stable.   ECG (personally reviewed): NSR, normal  Labs (10/24): LDL 56 Labs (11/24): pro-BNP 1389,  hgb 13.8, K 4.2, creatinine 0.93 Labs (12/24): K 3.9, creatinine 0.86, anti-SCL 70 negative, anti-centromere negative, RF 396, CCP > 250.  Labs (4/25): hgb 11.8, K 3.8, creatinine 0.68, LFTs normal  6 minute walk (1/25): 152 m  6 minute walk (4/25): 243 m  PMH: 1. Hypothyroidism 2. CAD: NSTEMI thought to be related to vasospasm in 1991 and 1998.  - LHC (12/24): Normal coronaries.  3. HTN: Orthostatic hypotension with amlodipine .  4. Hyperlipidemia 5. Atrial septal aneurysm 6. Asthma: Since childhood, sees Dr. Baldwin Levee 7. Breast cancer: S/p lumpectomy, radiation.  8. Rheumatoid arthritis: Not on DMARD.  9. Raynaud's phenomenon.  10. Pulmonary hypertension: Echo (12/23) with EF 60-65%, mild RV enlargement with normal RV systolic function, severe RAE, PASP 48 mmHg.  - RHC (12/24): mean RA 8, PA 78/25 mean 46, mean PCWP 14, CI 2.1, PVR 10.4 WU, PAPi 6.6.  - Echo (12/24): EF 70-75%, moderate LVH, D-shaped septum, mild RV dysfunction with mildly dilated RV, PASP 73 mmHg, mild-moderate MR, IVC dilated.  - V/Q scan (3/25): No chronic PE.  - High resolution CT chest (3/25): No interstitial lung disease but cannot exclude bronchiolitis obliterans, also noted was a stable calcified right adrenal mass.  11. Kyphoscoliosis 12. Right THR  SH: Widow, lives alone in [place], 2 children locally. Nonsmoker. No ETOH.   Family History  Problem Relation Age of Onset   Heart failure Mother    Pneumonia Mother    Hypertension Mother    Heart  disease Mother    Stroke Maternal Grandfather    Hypertension Maternal Grandfather    Heart attack Father    Heart disease Father    Asthma Paternal Uncle        PAT UNCLES   Heart attack Paternal Uncle    ROS: All systems reviewed and negative except as per HPI.   Current Outpatient Medications  Medication Sig Dispense Refill   albuterol  (VENTOLIN  HFA) 108 (90 Base) MCG/ACT inhaler Inhale 2 puffs into the lungs every 4 (four) hours as needed for wheezing  or shortness of breath. 1 each 11   aspirin  EC 81 MG tablet Take 1 tablet (81 mg total) by mouth daily. Swallow whole. 90 tablet 3   atorvastatin  (LIPITOR) 20 MG tablet Take 1 tablet (20 mg total) by mouth daily. 90 tablet 3   bisoprolol  (ZEBETA ) 5 MG tablet TAKE 1 TABLET BY MOUTH DAILY 90 tablet 1   clobetasol  cream (TEMOVATE ) 0.05 % Apply 1 Application topically as needed.     DULoxetine  (CYMBALTA ) 30 MG capsule TAKE ONE CAPSULE EVERY DAY IN ADDITION TO 60MG  CAPSULE FOR A TOTAL OF 90MG  DAILY 90 capsule 1   DULoxetine  (CYMBALTA ) 60 MG capsule TAKE ONE CAPSULE BY MOUTH EVERY DAY TAKE ALONG WITH 30MG  CAPSULE. TOTAL DAILY DOSE is 90MG . 90 capsule 1   famotidine  (PEPCID ) 20 MG tablet TAKE ONE TABLET EVERY DAY AFTER SUPPER 90 tablet 2   fluticasone  furoate-vilanterol (BREO ELLIPTA ) 100-25 MCG/ACT AEPB INHALE 1 PUFF INTO THE LUNGS DAILY 60 each 11   furosemide  (LASIX ) 20 MG tablet Take 2 tablets (40 mg total) by mouth daily. 180 tablet 3   gabapentin  (NEURONTIN ) 600 MG tablet TAKE ONE TABLET BY MOUTH EVERY DAY AT BEDTIME 90 tablet 1   HYDROcodone -acetaminophen  (NORCO) 10-325 MG tablet Take 1 tablet by mouth 3 (three) times daily as needed. Do Not Fill Before 08/04/2023 90 tablet 0   ipratropium-albuterol  (DUONEB) 0.5-2.5 (3) MG/3ML SOLN Take 3 mLs by nebulization every 6 (six) hours as needed (wheezing or SOB). During exacerbation 120 mL 3   levothyroxine  (SYNTHROID ) 100 MCG tablet Take 1 tablet (100 MCG) by mouth daily 6 days a week.  Take 1/2 tablet by mouth 1 day a week     macitentan  (OPSUMIT ) 10 MG tablet Take 1 tablet (10 mg total) by mouth daily.     tadalafil , PAH, (ADCIRCA ) 20 MG tablet Take 2 tablets (40 mg total) by mouth daily. 60 tablet 11   No current facility-administered medications for this encounter.   BP 124/70   Pulse 85   Wt 59.6 kg (131 lb 6.4 oz)   SpO2 91%   BMI 25.66 kg/m  General: NAD, kyphotic Neck: No JVD, no thyromegaly or thyroid  nodule.  Lungs: Clear to  auscultation bilaterally with normal respiratory effort. CV: Nondisplaced PMI.  Heart regular S1/S2, no S3/S4, no murmur.  No peripheral edema.  No carotid bruit.  Normal pedal pulses.  Abdomen: Soft, nontender, no hepatosplenomegaly, no distention.  Skin: Intact without lesions or rashes.  Neurologic: Alert and oriented x 3.  Psych: Normal affect. Extremities: No clubbing or cyanosis.  HEENT: Normal.   Assessment/Plan: 1. Pulmonary hypertension/RV failure: Echo in 12/24 with  EF 70-75%, moderate LVH, D-shaped septum, mild RV dysfunction with mildly dilated RV, PASP 73 mmHg, mild-moderate MR, IVC dilated. RHC in 12/24 with normal filling pressures, CI 2.1, and severe PAH with mean PA pressure 46 and PVR 10.4 WU.  V/Q scan in 3/25 with no evidence for chronic  PE, high resolution CT chest in 3/25 with no evidence for interstitial lung disease.  I suspect group 1 PH related to rheumatologic disease. She has been given the diagnosis of RA in the past though not currently being treated, and she has a h/o Raynaud's phenomenon. Rheumatologic labs suggest RA with elevated RF and CCP.  NYHA class III symptoms chronically.  She is not volume overloaded on exam today.  She says that she does not feel much different on PH medications, but her 6 minute walk is significantly improved today.  - Continue Opsumit  10 mg daily.  - Increase tadalafil  to 40 mg daily.   - I will next work on getting her started on Uptravi.  - Continue Lasix  40 mg daily, BMET/BNP today - She did not tolerate Jardiance .  2. Rheumatoid arthritis: Patient carries this diagnosis and has hand changes consistent with RA.  She is not on disease-specific meds currently.  She also has Raynaud's phenomenon.  RF and CCP elevated. - She has a rheumatology appt scheduled but would like to move it sooner if possible.  3. CAD: Patient had NSTEMIs in 1991 and 1998, per her report thought to be due to vasospasm.  LHC in 12/24 showed no significant  coronary disease.  - Continue ASA 81  - Continue statin.  4. Hyperlipidemia: She is on atorvastatin , good lipids in 10/24.  5. Kyphoscoliosis: This likely contributed to her dyspnea via chest wall restriction.  6. HTN: BP controlled.  7. Abnormal CT chest: Cannot rule out bronchiolitis obliterans per report though no ILD.  Also with calcified right adrenal mass.  - Needs dedicated adrenal CT (has been ordered).  - Has appointment with Dr. Baldwin Levee with pulmonary.   Followup 2 months.   I spent 31 minutes reviewing data, interviewing patient, speaking with colleague about him, and organizing the orders/followup.   Peder Bourdon 08/15/2023

## 2023-08-15 NOTE — Telephone Encounter (Signed)
 Patient Advocate Encounter   Received notification from [facility] that prior authorization for Uptravi is required.   PA submitted on CoverMyMeds Key [id number] Status is pending   Will continue to follow.   Luster Salters, PharmD, BCPS, BCCP, CPP [facility] Clinic Pharmacist [phone]

## 2023-08-16 ENCOUNTER — Encounter: Admitting: Internal Medicine

## 2023-08-16 NOTE — Telephone Encounter (Signed)
 Advanced [facility] Failure Patient Advocate Encounter  Prior Authorization for Alison [place] has been approved.    Effective dates: 08/16/23 through 08/14/24   Referral faxed to [facility]. Once processed, will be triaged to [facility].    Luster Salters, PharmD, BCPS, BCCP, CPP Heart Failure Clinic Pharmacist [phone]

## 2023-08-16 NOTE — Progress Notes (Deleted)
 Office Visit Note  Patient: Hannah Scott             Date of Birth: [birth year]           MRN: [id number]             PCP: Colene Dauphin, MD Referring: Darlis Eisenmenger, MD Visit Date: 08/16/2023 Occupation: @GUAROCC @  Subjective:  No chief complaint on file.   History of Present Illness: ELYSA WOMAC is a 73 y.o. female ***     Activities of Daily Living:  Patient reports morning stiffness for *** {minute/hour:19697}.   Patient {ACTIONS;DENIES/REPORTS:21021675::"Denies"} nocturnal pain.  Difficulty dressing/grooming: {ACTIONS;DENIES/REPORTS:21021675::"Denies"} Difficulty climbing stairs: {ACTIONS;DENIES/REPORTS:21021675::"Denies"} Difficulty getting out of chair: {ACTIONS;DENIES/REPORTS:21021675::"Denies"} Difficulty using hands for taps, buttons, cutlery, and/or writing: {ACTIONS;DENIES/REPORTS:21021675::"Denies"}  No Rheumatology ROS completed.   PMFS History:  Patient Active Problem List   Diagnosis Date Noted   Vasospasm (HCC) 03/13/2023   Chronic respiratory failure (HCC) 03/13/2023   Leg cramping 12/16/2022   B12 deficiency 01/22/2022   Zinc  deficiency 01/19/2022   Chronic diastolic heart failure (HCC) 01/19/2022   Severe pulmonary hypertension (HCC) 01/19/2022   Abnormal CT of the chest 12/01/2021   Anemia due to zinc  deficiency 11/02/2021   LVH (left ventricular hypertrophy) 10/29/2021   Deficiency anemia 10/29/2021   Iron deficiency anemia secondary to inadequate dietary iron intake 10/29/2021   Stage 3b chronic kidney disease (HCC) 10/29/2021   Acute cystitis with hematuria    NSTEMI (non-ST elevated myocardial infarction) (HCC)    Esophageal dysphagia    Benign neoplasm of ascending colon    Benign neoplasm of descending colon    Kyphoscoliosis 03/30/2021   GERD (gastroesophageal reflux disease) 01/11/2021   Vitamin D  deficiency 01/11/2021   Insomnia 09/11/2019   Murmur, cardiac 01/16/2018   Moderate persistent asthma 12/13/2017   Osteoporosis  01/07/2017   Diabetes mellitus without complication (HCC) 07/07/2016   History of breast cancer 01/06/2016   Intraductal papilloma of left breast 01/06/2016   Essential hypertension 06/08/2015   DOE (dyspnea on exertion) 06/08/2015   Fatigue 05/20/2015   Back pain 04/22/2015   Multinodular goiter 12/01/2011   Hypothyroidism    CAD (coronary artery disease)    Atrial septal aneurysm    Hyperlipidemia    Spinal stenosis, lumbar region, with neurogenic claudication 07/01/2011   Lumbar postlaminectomy syndrome 07/01/2011   Osteoarthritis of right hip 07/01/2011   Contracture of right hip 07/01/2011   Endometriosis    Depression 12/24/2009   Migraine headache 05/19/2006    Past Medical History:  Diagnosis Date   Anemia    hx   Arthritis    Asthma    PFTs, February, 2011, moderate obstructive disease with response to bronchodilators, normal lung volumes, moderate reduction in diffusing capacity   Atrial septal aneurysm    Echo, 2008-not noted on 13 echo   CAD (coronary artery disease)    90% distal LAD in the past  /   nuclear, 2008, no ischemia, ejection fraction 70%   Cancer (HCC) 2002   DUCTAL CIS--S/P LUMPECTOMY, RADIATION AND 6 WEEKS OF TAMOXIFEN   D-dimer, elevated    January, 2014   Depression    Ejection fraction    EF 60%, echo, October, 2008   Elevated CPK    January, 2014   Endometriosis 1989   RIGHT TUBE   Endometriosis 1987   LEFT TUBE/OVARY W FOCAL IN-SITU ENDOMETRIAL ADENOCARCINOMA   GERD (gastroesophageal reflux disease)    occ   H/O hiatal hernia    ?  Hyperlipidemia    Hypertension    Hypothyroidism    Patient has had in the past that she does not need treatment   Kyphoscoliosis    Obstructive airway disease (HCC)    Pinched nerve    lower back   Shortness of breath    Echo 2/22: EF 60-65, no RWMA, mild LVH, GR 1  DD, GLS -24%, normal RVSF, mild MR, trivial AI, borderline dilation of aortic root (39 mm)   UTI (lower urinary tract infection)      Family History  Problem Relation Age of Onset   Heart failure Mother    Pneumonia Mother    Hypertension Mother    Heart disease Mother    Stroke Maternal Grandfather    Hypertension Maternal Grandfather    Heart attack Father    Heart disease Father    Asthma Paternal Uncle        PAT UNCLES   Heart attack Paternal Uncle    Past Surgical History:  Procedure Laterality Date   ANKLE SURGERY Left    ligament   APPENDECTOMY  1987   AT [facility]   BACK SURGERY     Fusion   BREAST LUMPECTOMY  2003   radiation on right   BREAST LUMPECTOMY WITH RADIOACTIVE SEED LOCALIZATION Left 02/12/2016   Procedure: LEFT BREAST LUMPECTOMY WITH RADIOACTIVE SEED LOCALIZATION;  Surgeon: Ayesha Lente, MD;  Location: [facility];  Service: General;  Laterality: Left;   CARDIOVASCULAR STRESS TEST  09/07/05   Nuclear, was negative   CATARACT EXTRACTION Bilateral 01,03   COLONOSCOPY WITH PROPOFOL  N/A 05/04/2021   Procedure: COLONOSCOPY WITH PROPOFOL ;  Surgeon: Kenney Peacemaker, MD;  Location: [facility];  Service: Endoscopy;  Laterality: N/A;   ESOPHAGOGASTRODUODENOSCOPY (EGD) WITH PROPOFOL  N/A 05/04/2021   Procedure: ESOPHAGOGASTRODUODENOSCOPY (EGD) WITH PROPOFOL ;  Surgeon: Kenney Peacemaker, MD;  Location: [facility];  Service: Endoscopy;  Laterality: N/A;   OOPHORECTOMY     LSO -RSO   PELVIC LAPAROSCOPY  1989   RSO, LYSIS OF ADHESIONS   POLYPECTOMY  05/04/2021   Procedure: POLYPECTOMY;  Surgeon: Kenney Peacemaker, MD;  Location: [facility];  Service: Endoscopy;;   RIGHT/LEFT HEART CATH AND CORONARY ANGIOGRAPHY N/A 03/28/2023   Procedure: RIGHT/LEFT HEART CATH AND CORONARY ANGIOGRAPHY;  Surgeon: Darlis Eisenmenger, MD;  Location: [facility];  Service: Cardiovascular;  Laterality: N/A;   TOTAL ABDOMINAL HYSTERECTOMY  1987   LSO, APPENDECTOMY   TOTAL HIP ARTHROPLASTY Right 10/28/2013   dr Murrel Arnt   TOTAL HIP ARTHROPLASTY Right 10/28/2013   Procedure: TOTAL HIP ARTHROPLASTY ANTERIOR  APPROACH;  Surgeon: Adah Acron, MD;  Location: [facility];  Service: Orthopedics;  Laterality: Right;  Right Total Hip Arthroplasty, Direct Anterior Approach   Social History   Social History Narrative   Widowed in 2015   2 sons 1 lives in the area the other is in close contact /2025   1 grandchild   Never smoker no drug use no alcohol    Immunization History  Administered Date(s) Administered   Fluad Quad(high Dose 65+) 01/04/2019, 01/20/2022   Fluad Trivalent(High Dose 65+) 02/07/2023   Influenza Split 03/23/2011, 02/15/2012   Influenza Whole 02/12/2003, 12/24/2009   Influenza, High Dose Seasonal PF 01/06/2016, 01/10/2017, 03/16/2018   Influenza,inj,Quad PF,6+ Mos 01/29/2013, 01/03/2014, 02/11/2015   Influenza-Unspecified 04/09/2021   Moderna Covid-19 Vaccine Bivalent Booster 87yrs & up 03/26/2021   Moderna SARS-COV2 Booster Vaccination 04/10/2020   Moderna Sars-Covid-2 Vaccination 06/21/2019, 07/24/2019   Pneumococcal Conjugate-13 05/06/2015  Pneumococcal Polysaccharide-23 07/06/2016   Tdap 01/09/2019     Objective: Vital Signs: There were no vitals taken for this visit.   Physical Exam   Musculoskeletal Exam: ***  CDAI Exam: CDAI Score: -- Patient Global: --; Provider Global: -- Swollen: --; Tender: -- Joint Exam 08/16/2023   No joint exam has been documented for this visit   There is currently no information documented on the homunculus. Go to the Rheumatology activity and complete the homunculus joint exam.  Investigation: No additional findings.  Imaging: CT Chest High Resolution Result Date: 07/26/2023 CLINICAL DATA:  Pulmonary hypertension. EXAM: CT CHEST WITHOUT CONTRAST TECHNIQUE: Multidetector CT imaging of the chest was performed following the standard protocol without intravenous contrast. High resolution imaging of the lungs, as well as inspiratory and expiratory imaging, was performed. RADIATION DOSE REDUCTION: This exam was performed according to the  departmental dose-optimization program which includes automated exposure control, adjustment of the mA and/or kV according to patient size and/or use of iterative reconstruction technique. COMPARISON:  03/28/2022. FINDINGS: Cardiovascular: Atherosclerotic calcification of the aorta and coronary arteries. Enlarged pulmonic trunk and heart. No pericardial effusion. Ascending aorta measures up to 3.8 cm (coronal image 101). Mediastinum/Nodes: Mediastinal lymph nodes measure up to 10 mm in the low left paratracheal station, unchanged. No pathologically enlarged mediastinal or axillary lymph nodes. Hilar regions are difficult to definitively evaluate without IV contrast. Esophagus is grossly unremarkable. Lungs/Pleura: Bibasilar scarring. Negative for subpleural reticulation, traction bronchiectasis/bronchiolectasis, ground glass, architectural distortion or honeycombing. Mosaic pulmonary parenchymal attenuation with air trapping. There may be some vascular attenuation within the lucent areas of the lung. No pleural fluid. Airway is unremarkable. Upper Abdomen: Gallstones. Coarsely calcified right adrenal mass measures 3.9 x 7.0 cm, stable when remeasured. Moderate hiatal hernia. Visualized portions of the liver, gallbladder, adrenal glands, kidneys, spleen, pancreas, stomach and bowel are otherwise grossly unremarkable. No upper abdominal adenopathy. Musculoskeletal: Severe kyphoscoliosis. Osteopenia. Degenerative changes in the spine. IMPRESSION: 1. No evidence of fibrotic interstitial lung disease. 2. Mosaic attenuation with air trapping, indicative of small airways disease. Difficult to exclude a component of bronchiolitis obliterans. 3. Cholelithiasis. 4. Coarsely calcified right adrenal mass, stable. This could be further evaluated with CT abdomen without and contrast, as clinically indicated. 5. Aortic atherosclerosis (ICD10-I70.0). Coronary artery calcification. 6. Enlarged pulmonic trunk, indicative of pulmonary  arterial hypertension. Electronically Signed   By: Shearon Denis M.D.   On: 07/26/2023 09:45    Recent Labs: Lab Results  Component Value Date   WBC 3.5 (L) 08/08/2023   HGB 11.8 (L) 08/08/2023   PLT 147.0 (L) 08/08/2023   NA 137 08/08/2023   K 3.8 08/08/2023   CL 97 08/08/2023   CO2 36 (H) 08/08/2023   GLUCOSE 90 08/08/2023   BUN 19 08/08/2023   CREATININE 0.68 08/08/2023   BILITOT 0.6 08/08/2023   ALKPHOS 92 08/08/2023   AST 20 08/08/2023   ALT 11 08/08/2023   PROT 6.0 08/08/2023   ALBUMIN  4.1 08/08/2023   CALCIUM  8.9 08/08/2023   GFRAA 79 06/05/2020    Speciality Comments: No specialty comments available.  Procedures:  No procedures performed Allergies: Fluticasone -salmeterol, Norvasc  [amlodipine  besylate], Tape, Heparin , and Morphine    Assessment / Plan:     Visit Diagnoses: No diagnosis found.  Orders: No orders of the defined types were placed in this encounter.  No orders of the defined types were placed in this encounter.   Face-to-face time spent with patient was *** minutes. Greater than 50% of time was spent  in counseling and coordination of care.  Follow-Up Instructions: No follow-ups on file.   Matt Song, MD  Note - This record has been created using [employer].  Chart creation errors have been sought, but may not always  have been located. Such creation errors do not reflect on  the standard of medical care.

## 2023-08-21 ENCOUNTER — Ambulatory Visit: Attending: Internal Medicine | Admitting: Internal Medicine

## 2023-08-21 ENCOUNTER — Ambulatory Visit

## 2023-08-21 ENCOUNTER — Encounter: Payer: Self-pay | Admitting: Internal Medicine

## 2023-08-21 VITALS — BP 114/64 | HR 76 | Resp 18 | Ht <= 58 in | Wt 132.0 lb

## 2023-08-21 DIAGNOSIS — M059 Rheumatoid arthritis with rheumatoid factor, unspecified: Secondary | ICD-10-CM | POA: Diagnosis not present

## 2023-08-21 DIAGNOSIS — R200 Anesthesia of skin: Secondary | ICD-10-CM

## 2023-08-21 DIAGNOSIS — M159 Polyosteoarthritis, unspecified: Secondary | ICD-10-CM

## 2023-08-21 DIAGNOSIS — R202 Paresthesia of skin: Secondary | ICD-10-CM

## 2023-08-21 DIAGNOSIS — N1832 Chronic kidney disease, stage 3b: Secondary | ICD-10-CM | POA: Diagnosis not present

## 2023-08-21 MED ORDER — UPTRAVI TITRATION 200 & 800 MCG PO TBPK: ORAL_TABLET | ORAL | Status: DC

## 2023-08-21 NOTE — Progress Notes (Unsigned)
 Office Visit Note  Patient: Hannah Scott             Date of Birth: [birth year]           MRN: [id number]             PCP: Colene Dauphin, MD Referring: Darlis Eisenmenger, MD Visit Date: 08/21/2023   Subjective:  New Patient (Initial Visit) (Abnormal labs)  Discussed the use of AI scribe software for clinical note transcription with the patient, who gave verbal consent to proceed.  History of Present Illness   Hannah Scott is a 73 year old female here for evaluation and management of her arthritis with an unclear RA history and highly positive RF and CCP Abs.  She has a long-standing history of rheumatoid arthritis, diagnosed in her late twenties or thirties, initially presenting with transient joint swelling and redness. These episodes were confirmed by blood tests at the time. Over the years, her symptoms subsided significantly, and she has not been on specific rheumatoid arthritis medications for a long time. Currently, she is experiencing significant issues with her feet, particularly her toes, which have become deformed over the past two to three months. She describes her toes as 'going over' and one toe hitting the floor when she walks, causing discomfort. This is the worst symptom she is experiencing at the moment.  She reports numbness in the first three fingers of her right hand, describing it as feeling 'like when you've been to the dentist.' This numbness is persistent and affects her ability to hold objects. No similar symptoms in her left hand, although her little finger tingles occasionally.  Her shoulder pain has been present for the past two to three months, with occasional aching and a 'squishy' sound. She has not had any procedures or injections for her joints, except for her back, which has received shots in the past.  She is currently taking hydrocodone  10 mg up to three times a day for back pain, gabapentin  600 mg at night, and duloxetine  90 mg daily (30 mg and 60 mg  tablets). She has used prednisone  in the past for asthma, which also alleviated her joint symptoms. No numbness in her feet but notes some swelling, particularly in her right foot. She has not experienced recent oral steroid use for her arthritis.      Labs reviewed 03/2023 RF 395.5 CCP >250 Centromere, Scl-70 neg  08/2021 HAV/HBV/HCV neg  Imaging reviewed 07/17/23 HRCT Chest IMPRESSION: 1. No evidence of fibrotic interstitial lung disease. 2. Mosaic attenuation with air trapping, indicative of small airways disease. Difficult to exclude a component of bronchiolitis obliterans. 3. Cholelithiasis. 4. Coarsely calcified right adrenal mass, stable. This could be further evaluated with CT abdomen without and contrast, as clinically indicated. 5. Aortic atherosclerosis (ICD10-I70.0). Coronary artery calcification. 6. Enlarged pulmonic trunk, indicative of pulmonary arterial hypertension.  03/28/23 R/L Heart Cath 1. Minimal coronary disease.  2. Normal filling pressures (mean PCWP 14) 3. Severe pulmonary arterial hypertension with PVR 10.4 WU.   Activities of Daily Living:  Patient reports morning stiffness for 2 hours.   Patient Reports nocturnal pain.  Difficulty dressing/grooming: Reports Difficulty climbing stairs: Reports Difficulty getting out of chair: Reports Difficulty using hands for taps, buttons, cutlery, and/or writing: Reports  Review of Systems  Constitutional:  Positive for fatigue.  HENT:  Positive for mouth dryness. Negative for mouth sores.   Eyes:  Negative for dryness.  Respiratory:  Positive for shortness of breath.  Cardiovascular:  Positive for palpitations. Negative for chest pain.  Gastrointestinal:  Negative for blood in stool, constipation and diarrhea.  Endocrine: Negative for increased urination.  Genitourinary:  Negative for involuntary urination.  Musculoskeletal:  Positive for joint pain, gait problem, joint pain, joint swelling, myalgias,  muscle weakness, morning stiffness, muscle tenderness and myalgias.  Skin:  Positive for rash, hair loss and sensitivity to sunlight. Negative for color change.  Allergic/Immunologic: Negative for susceptible to infections.  Neurological:  Positive for dizziness. Negative for headaches.  Hematological:  Negative for swollen glands.  Psychiatric/Behavioral:  Positive for depressed mood and sleep disturbance. The patient is nervous/anxious.     PMFS History:  Patient Active Problem List   Diagnosis Date Noted   Vasospasm (HCC) 03/13/2023   Chronic respiratory failure (HCC) 03/13/2023   Leg cramping 12/16/2022   B12 deficiency 01/22/2022   Zinc  deficiency 01/19/2022   Chronic diastolic heart failure (HCC) 01/19/2022   Severe pulmonary hypertension (HCC) 01/19/2022   Abnormal CT of the chest 12/01/2021   Anemia due to zinc  deficiency 11/02/2021   LVH (left ventricular hypertrophy) 10/29/2021   Deficiency anemia 10/29/2021   Iron deficiency anemia secondary to inadequate dietary iron intake 10/29/2021   Stage 3b chronic kidney disease (HCC) 10/29/2021   Acute cystitis with hematuria    NSTEMI (non-ST elevated myocardial infarction) (HCC)    Esophageal dysphagia    Benign neoplasm of ascending colon    Benign neoplasm of descending colon    Kyphoscoliosis 03/30/2021   GERD (gastroesophageal reflux disease) 01/11/2021   Vitamin D  deficiency 01/11/2021   Insomnia 09/11/2019   Murmur, cardiac 01/16/2018   Moderate persistent asthma 12/13/2017   Osteoporosis 01/07/2017   Diabetes mellitus without complication (HCC) 07/07/2016   History of breast cancer 01/06/2016   Intraductal papilloma of left breast 01/06/2016   Essential hypertension 06/08/2015   DOE (dyspnea on exertion) 06/08/2015   Fatigue 05/20/2015   Back pain 04/22/2015   Multinodular goiter 12/01/2011   Hypothyroidism    CAD (coronary artery disease)    Atrial septal aneurysm    Hyperlipidemia    Spinal stenosis,  lumbar region, with neurogenic claudication 07/01/2011   Lumbar postlaminectomy syndrome 07/01/2011   Osteoarthritis of right hip 07/01/2011   Contracture of right hip 07/01/2011   Endometriosis    Depression 12/24/2009   Migraine headache 05/19/2006    Past Medical History:  Diagnosis Date   Anemia    hx   Arthritis    Asthma    PFTs, February, 2011, moderate obstructive disease with response to bronchodilators, normal lung volumes, moderate reduction in diffusing capacity   Atrial septal aneurysm    Echo, 2008-not noted on 13 echo   CAD (coronary artery disease)    90% distal LAD in the past  /   nuclear, 2008, no ischemia, ejection fraction 70%   Cancer (HCC) 2002   DUCTAL CIS--S/P LUMPECTOMY, RADIATION AND 6 WEEKS OF TAMOXIFEN   D-dimer, elevated    January, 2014   Depression    Ejection fraction    EF 60%, echo, October, 2008   Elevated CPK    January, 2014   Endometriosis 1989   RIGHT TUBE   Endometriosis 1987   LEFT TUBE/OVARY W FOCAL IN-SITU ENDOMETRIAL ADENOCARCINOMA   GERD (gastroesophageal reflux disease)    occ   H/O hiatal hernia    ?   High cholesterol    Hyperlipidemia    Hypertension    Hypothyroidism    Patient has had in  the past that she does not need treatment   Kyphoscoliosis    Obstructive airway disease (HCC)    Pinched nerve    lower back   Shortness of breath    Echo 2/22: EF 60-65, no RWMA, mild LVH, GR 1  DD, GLS -24%, normal RVSF, mild MR, trivial AI, borderline dilation of aortic root (39 mm)   UTI (lower urinary tract infection)     Family History  Problem Relation Age of Onset   Heart failure Mother    Pneumonia Mother    Hypertension Mother    Heart disease Mother    Stroke Maternal Grandfather    Hypertension Maternal Grandfather    Heart attack Father    Heart disease Father    Asthma Paternal Uncle        PAT UNCLES   Heart attack Paternal Uncle    Past Surgical History:  Procedure Laterality Date   ANKLE SURGERY Left     ligament   APPENDECTOMY  1987   AT [facility]   BACK SURGERY     Fusion   BREAST LUMPECTOMY  2003   radiation on right   BREAST LUMPECTOMY WITH RADIOACTIVE SEED LOCALIZATION Left 02/12/2016   Procedure: LEFT BREAST LUMPECTOMY WITH RADIOACTIVE SEED LOCALIZATION;  Surgeon: Ayesha Lente, MD;  Location: [facility];  Service: General;  Laterality: Left;   CARDIOVASCULAR STRESS TEST  09/07/05   Nuclear, was negative   CATARACT EXTRACTION Bilateral 01,03   COLONOSCOPY WITH PROPOFOL  N/A 05/04/2021   Procedure: COLONOSCOPY WITH PROPOFOL ;  Surgeon: Kenney Peacemaker, MD;  Location: [facility];  Service: Endoscopy;  Laterality: N/A;   ESOPHAGOGASTRODUODENOSCOPY (EGD) WITH PROPOFOL  N/A 05/04/2021   Procedure: ESOPHAGOGASTRODUODENOSCOPY (EGD) WITH PROPOFOL ;  Surgeon: Kenney Peacemaker, MD;  Location: [facility];  Service: Endoscopy;  Laterality: N/A;   OOPHORECTOMY     LSO -RSO   PELVIC LAPAROSCOPY  1989   RSO, LYSIS OF ADHESIONS   POLYPECTOMY  05/04/2021   Procedure: POLYPECTOMY;  Surgeon: Kenney Peacemaker, MD;  Location: [facility];  Service: Endoscopy;;   RIGHT/LEFT HEART CATH AND CORONARY ANGIOGRAPHY N/A 03/28/2023   Procedure: RIGHT/LEFT HEART CATH AND CORONARY ANGIOGRAPHY;  Surgeon: Darlis Eisenmenger, MD;  Location: [facility];  Service: Cardiovascular;  Laterality: N/A;   TOTAL ABDOMINAL HYSTERECTOMY  1987   LSO, APPENDECTOMY   TOTAL HIP ARTHROPLASTY Right 10/28/2013   dr Murrel Arnt   TOTAL HIP ARTHROPLASTY Right 10/28/2013   Procedure: TOTAL HIP ARTHROPLASTY ANTERIOR APPROACH;  Surgeon: Adah Acron, MD;  Location: [facility];  Service: Orthopedics;  Laterality: Right;  Right Total Hip Arthroplasty, Direct Anterior Approach   Social History   Social History Narrative   Widowed in 2015   2 sons 1 lives in the area the other is in close contact /2025   1 grandchild   Never smoker no drug use no alcohol    Immunization History  Administered Date(s) Administered   Fluad  Quad(high Dose 65+) 01/04/2019, 01/20/2022   Fluad Trivalent(High Dose 65+) 02/07/2023   Influenza Split 03/23/2011, 02/15/2012   Influenza Whole 02/12/2003, 12/24/2009   Influenza, High Dose Seasonal PF 01/06/2016, 01/10/2017, 03/16/2018   Influenza,inj,Quad PF,6+ Mos 01/29/2013, 01/03/2014, 02/11/2015   Influenza-Unspecified 04/09/2021   Moderna Covid-19 Vaccine Bivalent Booster 62yrs & up 03/26/2021   Moderna SARS-COV2 Booster Vaccination 04/10/2020   Moderna Sars-Covid-2 Vaccination 06/21/2019, 07/24/2019   Pneumococcal Conjugate-13 05/06/2015   Pneumococcal Polysaccharide-23 07/06/2016   Tdap 01/09/2019     Objective: Vital Signs:  BP 114/64 (BP Location: Right Arm, Patient Position: Sitting, Cuff Size: Normal)   Pulse 76   Resp 18   Ht 4' 9.5" (1.461 m)   Wt 132 lb (59.9 kg)   BMI 28.07 kg/m    Physical Exam HENT:     Mouth/Throat:     Mouth: Mucous membranes are dry.     Pharynx: Oropharynx is clear.  Eyes:     Conjunctiva/sclera: Conjunctivae normal.  Cardiovascular:     Rate and Rhythm: Normal rate and regular rhythm.  Pulmonary:     Effort: Pulmonary effort is normal.     Breath sounds: Normal breath sounds.  Musculoskeletal:     Right lower leg: No edema.     Left lower leg: No edema.  Lymphadenopathy:     Cervical: No cervical adenopathy.  Skin:    General: Skin is warm and dry.     Findings: No rash.     Comments: Mild nonspecific nailfold capillary changes more pronounced on right hand fingers Faintly violaceous discoloration of the tips of several fingers and toes with normal capillary refill Palpable DP pulses  Neurological:     Mental Status: She is alert.  Psychiatric:        Mood and Affect: Mood normal.      Musculoskeletal Exam:  Right shoulder tenderness on anterior side of joint, full range of motion is intact to some pain and crepitus or impingement with full overhead abduction Elbows full ROM no tenderness or swelling Wrists full ROM,  right wrist pain with percussion radiating into hand, no palpable swelling Distal Heberden's nodes throughout both hands, first CMC squaring on both hands, left thumb with nonreducible MCP subluxation Severe kyphoscoliosis, lateral rotations in thoracic spine Knees full ROM, slight left knee swelling, anterior joint line tenderness Advanced right foot 1st and 5th MTP bunions with deviation, secondary deviation of 2nd and 4th toes compressing third digit with hammertoe deformity and small skin lesion on lateral border of the third toe, callus on tip of the right third toe and under right third MTP joint   Investigation: No additional findings.  Imaging: No results found.  Recent Labs: Lab Results  Component Value Date   WBC 3.5 (L) 08/08/2023   HGB 11.8 (L) 08/08/2023   PLT 147.0 (L) 08/08/2023   NA 137 08/08/2023   K 3.8 08/08/2023   CL 97 08/08/2023   CO2 36 (H) 08/08/2023   GLUCOSE 90 08/08/2023   BUN 19 08/08/2023   CREATININE 0.68 08/08/2023   BILITOT 0.6 08/08/2023   ALKPHOS 92 08/08/2023   AST 20 08/08/2023   ALT 11 08/08/2023   PROT 6.0 08/08/2023   ALBUMIN  4.1 08/08/2023   CALCIUM  8.9 08/08/2023   GFRAA 79 06/05/2020    Speciality Comments: No specialty comments available.  Procedures:  No procedures performed Allergies: Fluticasone -salmeterol, Norvasc  [amlodipine  besylate], Tape, Heparin , and Morphine    Assessment / Plan:     Visit Diagnoses: Seropositive rheumatoid arthritis (HCC) - Plan: XR Hand 2 View Right, XR Hand 2 View Left, XR Foot 2 Views Right, XR Foot 2 Views Left, XR Shoulder Right, Sedimentation rate, C-reactive protein Highly positive rheumatoid factor and CCP antibodies. Unclear if symptoms are due to active inflammation or chronic joint damage.  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  No peripheral joint synovitis appreciable on exam today.  Methotrexate discussed as potential  treatment if active inflammation confirmed. - Order x-rays of hands, feet,  and possibly shoulder to assess joint damage. - Order blood tests including sedimentation rate and C-reactive protein to evaluate current inflammation. - Provide information on methotrexate for review, pending further evaluation.  Stage 3b chronic kidney disease (HCC) Chart documented as stage IIIb CKD but most recent labs reviewed showed GFR greater than 60.  I do not believe she would have a real contraindication to methotrexate if indicated by her workup today.  Reviewed risk of medication including cytopenias, hepatotoxicity, infections alcohol  abstinence, and laboratory monitoring.  Could also be candidate for hydroxychloroquine if nonerosive disease.  Osteoarthritis Suspected in hands and knees. Shoulder pain may be related to rotator cuff issues rather than severe osteoarthritis. - Order x-rays of knees to assess for osteoarthritis. - Consider physical therapy or other interventions for shoulder if indicated by further evaluation.  Carpal tunnel syndrome Numbness and tingling in the first three fingers of the right hand suggestive of carpal tunnel syndrome. Positive Tinel's sign. - Recommend nerve conduction study to confirm diagnosis and determine site of nerve impingement or can wait to monitor symptom progress if starting anti-inflammatory treatment.     Orders: Orders Placed This Encounter  Procedures   XR Hand 2 View Right   XR Hand 2 View Left   XR Foot 2 Views Right   XR Foot 2 Views Left   XR Shoulder Right   Sedimentation rate   C-reactive protein   No orders of the defined types were placed in this encounter.    Follow-Up Instructions: Return in about 3 months (around 11/20/2023) for New pt RA/OA f/u 3mos.   Matt Song, MD  Note - This record has been created using [employer].  Chart creation errors have been sought, but may not always  have been located. Such creation  errors do not reflect on  the standard of medical care.

## 2023-08-22 DIAGNOSIS — J4540 Moderate persistent asthma, uncomplicated: Secondary | ICD-10-CM | POA: Diagnosis not present

## 2023-08-22 DIAGNOSIS — R0609 Other forms of dyspnea: Secondary | ICD-10-CM | POA: Diagnosis not present

## 2023-08-22 LAB — C-REACTIVE PROTEIN: CRP: <3 mg/L (ref <8.0)

## 2023-08-22 LAB — SEDIMENTATION RATE: Sed Rate: 2 mm/h (ref 0–30)

## 2023-08-23 NOTE — Progress Notes (Unsigned)
 Subjective:    Patient ID: Hannah Scott, female    DOB: 1951/03/28, 73 y.o.   MRN: [id number]  HPI: Hannah Scott is a 73 y.o. female who returns for follow up appointment for chronic pain and medication refill. states *** pain is located in  ***. rates pain ***. current exercise regime is walking and performing stretching exercises.  Ms. Coval Morphine  equivalent is *** MME.   Last Oral Swab was Performed on 07/20/2023, it was consistent.      Pain Inventory Average Pain 7 Pain Right Now 7 My pain is intermittent, constant, burning, tingling, and aching  In the last 24 hours, has pain interfered with the following? General activity 10 Relation with others 10 Enjoyment of life 10 What TIME of day is your pain at its worst? morning , daytime, evening, and night Sleep (in general) Poor  Pain is worse with: walking, sitting, standing, and some activites Pain improves with: rest, medication, and HEAT Relief from Meds: 6  Family History  Problem Relation Age of Onset   Heart failure Mother    Pneumonia Mother    Hypertension Mother    Heart disease Mother    Stroke Maternal Grandfather    Hypertension Maternal Grandfather    Heart attack Father    Heart disease Father    Asthma Paternal Uncle        PAT UNCLES   Heart attack Paternal Uncle    Social History   Socioeconomic History   Marital status: Widowed    Spouse name: Not on file   Number of children: Not on file   Years of education: Not on file   Highest education level: Not on file  Occupational History   Occupation: RETIRED  Tobacco Use   Smoking status: Never    Passive exposure: Never   Smokeless tobacco: Never  Vaping Use   Vaping status: Never Used  Substance and Sexual Activity   Alcohol  use: No    Alcohol /week: 0.0 standard drinks of alcohol    Drug use: No   Sexual activity: Not Currently    Partners: Male    Birth control/protection: Surgical  Other Topics Concern   Not on file  Social History  Narrative   Widowed in 2015   2 sons 1 lives in the area the other is in close contact /2025   1 grandchild   Never smoker no drug use no alcohol    Social Drivers of Corporate investment banker Strain: Medium Risk (08/01/2023)   Overall Financial Resource Strain (CARDIA)    Difficulty of Paying Living Expenses: Somewhat hard  Food Insecurity: No Food Insecurity (08/01/2023)   Hunger Vital Sign    Worried About Running Out of Food in the Last Year: Never true    Ran Out of Food in the Last Year: Never true  Transportation Needs: No Transportation Needs (08/01/2023)   PRAPARE - Administrator, Civil Service (Medical): No    Lack of Transportation (Non-Medical): No  Physical Activity: Inactive (08/01/2023)   Exercise Vital Sign    Days of Exercise per Week: 0 days    Minutes of Exercise per Session: 0 min  Stress: Stress Concern Present (08/01/2023)   [employer] of Occupational Health - Occupational Stress Questionnaire    Feeling of Stress : To some extent  Social Connections: Socially Isolated (08/01/2023)   Social Connection and Isolation Panel [NHANES]    Frequency of Communication with Friends and Family: More than three  times a week    Frequency of Social Gatherings with Friends and Family: Once a week    Attends Religious Services: Never    Database administrator or Organizations: No    Attends Banker Meetings: Never    Marital Status: Widowed   Past Surgical History:  Procedure Laterality Date   ANKLE SURGERY Left    ligament   APPENDECTOMY  1987   AT [facility]   BACK SURGERY     Fusion   BREAST LUMPECTOMY  2003   radiation on right   BREAST LUMPECTOMY WITH RADIOACTIVE SEED LOCALIZATION Left 02/12/2016   Procedure: LEFT BREAST LUMPECTOMY WITH RADIOACTIVE SEED LOCALIZATION;  Surgeon: Ayesha Lente, MD;  Location: [facility];  Service: General;  Laterality: Left;   CARDIOVASCULAR STRESS TEST  09/07/05   Nuclear, was negative    CATARACT EXTRACTION Bilateral 01,03   COLONOSCOPY WITH PROPOFOL  N/A 05/04/2021   Procedure: COLONOSCOPY WITH PROPOFOL ;  Surgeon: Kenney Peacemaker, MD;  Location: [facility];  Service: Endoscopy;  Laterality: N/A;   ESOPHAGOGASTRODUODENOSCOPY (EGD) WITH PROPOFOL  N/A 05/04/2021   Procedure: ESOPHAGOGASTRODUODENOSCOPY (EGD) WITH PROPOFOL ;  Surgeon: Kenney Peacemaker, MD;  Location: [facility];  Service: Endoscopy;  Laterality: N/A;   OOPHORECTOMY     LSO -RSO   PELVIC LAPAROSCOPY  1989   RSO, LYSIS OF ADHESIONS   POLYPECTOMY  05/04/2021   Procedure: POLYPECTOMY;  Surgeon: Kenney Peacemaker, MD;  Location: [facility];  Service: Endoscopy;;   RIGHT/LEFT HEART CATH AND CORONARY ANGIOGRAPHY N/A 03/28/2023   Procedure: RIGHT/LEFT HEART CATH AND CORONARY ANGIOGRAPHY;  Surgeon: Darlis Eisenmenger, MD;  Location[facility];  Service: Cardiovascular;  Laterality: N/A;   TOTAL ABDOMINAL HYSTERECTOMY  1987   LSO, APPENDECTOMY   TOTAL HIP ARTHROPLASTY Right 10/28/2013   dr Murrel Arnt   TOTAL HIP ARTHROPLASTY Right 10/28/2013   Procedure: TOTAL HIP ARTHROPLASTY ANTERIOR APPROACH;  Surgeon: Adah Acron, MD;  Location: [facility];  Service: Orthopedics;  Laterality: Right;  Right Total Hip Arthroplasty, Direct Anterior Approach   Past Surgical History:  Procedure Laterality Date   ANKLE SURGERY Left    ligament   APPENDECTOMY  1987   AT [facility]   BACK SURGERY     Fusion   BREAST LUMPECTOMY  2003   radiation on right   BREAST LUMPECTOMY WITH RADIOACTIVE SEED LOCALIZATION Left 02/12/2016   Procedure: LEFT BREAST LUMPECTOMY WITH RADIOACTIVE SEED LOCALIZATION;  Surgeon: Ayesha Lente, MD;  Location: [facility];  Service: General;  Laterality: Left;   CARDIOVASCULAR STRESS TEST  09/07/05   Nuclear, was negative   CATARACT EXTRACTION Bilateral 01,03   COLONOSCOPY WITH PROPOFOL  N/A 05/04/2021   Procedure: COLONOSCOPY WITH PROPOFOL ;  Surgeon: Kenney Peacemaker, MD;  Location: [facility];  Service:  Endoscopy;  Laterality: N/A;   ESOPHAGOGASTRODUODENOSCOPY (EGD) WITH PROPOFOL  N/A 05/04/2021   Procedure: ESOPHAGOGASTRODUODENOSCOPY (EGD) WITH PROPOFOL ;  Surgeon: Kenney Peacemaker, MD;  Location: [facility];  Service: Endoscopy;  Laterality: N/A;   OOPHORECTOMY     LSO -RSO   PELVIC LAPAROSCOPY  1989   RSO, LYSIS OF ADHESIONS   POLYPECTOMY  05/04/2021   Procedure: POLYPECTOMY;  Surgeon: Kenney Peacemaker, MD;  Location: [facility];  Service: Endoscopy;;   RIGHT/LEFT HEART CATH AND CORONARY ANGIOGRAPHY N/A 03/28/2023   Procedure: RIGHT/LEFT HEART CATH AND CORONARY ANGIOGRAPHY;  Surgeon: Darlis Eisenmenger, MD;  Location: [facility] INVASIVE CV LAB;  Service: Cardiovascular;  Laterality: N/A;   TOTAL ABDOMINAL  HYSTERECTOMY  1987   LSO, APPENDECTOMY   TOTAL HIP ARTHROPLASTY Right 10/28/2013   dr Murrel Arnt   TOTAL HIP ARTHROPLASTY Right 10/28/2013   Procedure: TOTAL HIP ARTHROPLASTY ANTERIOR APPROACH;  Surgeon: Adah Acron, MD;  Location: [facility];  Service: Orthopedics;  Laterality: Right;  Right Total Hip Arthroplasty, Direct Anterior Approach   Past Medical History:  Diagnosis Date   Anemia    hx   Arthritis    Asthma    PFTs, February, 2011, moderate obstructive disease with response to bronchodilators, normal lung volumes, moderate reduction in diffusing capacity   Atrial septal aneurysm    Echo, 2008-not noted on 13 echo   CAD (coronary artery disease)    90% distal LAD in the past  /   nuclear, 2008, no ischemia, ejection fraction 70%   Cancer (HCC) 2002   DUCTAL CIS--S/P LUMPECTOMY, RADIATION AND 6 WEEKS OF TAMOXIFEN   D-dimer, elevated    January, 2014   Depression    Ejection fraction    EF 60%, echo, October, 2008   Elevated CPK    January, 2014   Endometriosis 1989   RIGHT TUBE   Endometriosis 1987   LEFT TUBE/OVARY W FOCAL IN-SITU ENDOMETRIAL ADENOCARCINOMA   GERD (gastroesophageal reflux disease)    occ   H/O hiatal hernia    ?   High cholesterol    Hyperlipidemia     Hypertension    Hypothyroidism    Patient has had in the past that she does not need treatment   Kyphoscoliosis    Obstructive airway disease (HCC)    Pinched nerve    lower back   Shortness of breath    Echo 2/22: EF 60-65, no RWMA, mild LVH, GR 1  DD, GLS -24%, normal RVSF, mild MR, trivial AI, borderline dilation of aortic root (39 mm)   UTI (lower urinary tract infection)    There were no vitals taken for this visit.  Opioid Risk Score:   Fall Risk Score:  `1  Depression screen [facility] 2/9     08/01/2023    3:51 PM 06/20/2023    1:37 PM 04/14/2023   10:53 AM 03/16/2023    2:16 PM 01/03/2023    2:31 PM 11/22/2022    3:06 PM 10/21/2022    2:10 PM  Depression screen PHQ 2/9  Decreased Interest 2 0 0 0 1 0 1  Down, Depressed, Hopeless 3 0 0 0 1 0 1  PHQ - 2 Score 5 0 0 0 2 0 2  Altered sleeping 1        Tired, decreased energy 1        Change in appetite 0        Feeling bad or failure about yourself  3        Trouble concentrating 1        Moving slowly or fidgety/restless 0        Suicidal thoughts 0        PHQ-9 Score 11        Difficult doing work/chores Somewhat difficult          Review of Systems  Musculoskeletal:  Positive for back pain and gait problem.       PAIN IN BOTH KNEES, RIGHT SHOULDER & RIGHT HIP  All other systems reviewed and are negative.      Objective:   Physical Exam        Assessment & Plan:

## 2023-08-24 ENCOUNTER — Encounter: Payer: Self-pay | Admitting: Registered Nurse

## 2023-08-24 ENCOUNTER — Encounter: Attending: Registered Nurse | Admitting: Registered Nurse

## 2023-08-24 VITALS — BP 105/55 | HR 76 | Ht <= 58 in | Wt 128.0 lb

## 2023-08-24 DIAGNOSIS — M25551 Pain in right hip: Secondary | ICD-10-CM | POA: Insufficient documentation

## 2023-08-24 DIAGNOSIS — M48062 Spinal stenosis, lumbar region with neurogenic claudication: Secondary | ICD-10-CM | POA: Diagnosis not present

## 2023-08-24 DIAGNOSIS — M25561 Pain in right knee: Secondary | ICD-10-CM | POA: Diagnosis not present

## 2023-08-24 DIAGNOSIS — Z79891 Long term (current) use of opiate analgesic: Secondary | ICD-10-CM | POA: Insufficient documentation

## 2023-08-24 DIAGNOSIS — Z5181 Encounter for therapeutic drug level monitoring: Secondary | ICD-10-CM | POA: Insufficient documentation

## 2023-08-24 DIAGNOSIS — M24551 Contracture, right hip: Secondary | ICD-10-CM | POA: Diagnosis not present

## 2023-08-24 DIAGNOSIS — G8929 Other chronic pain: Secondary | ICD-10-CM | POA: Insufficient documentation

## 2023-08-24 DIAGNOSIS — G894 Chronic pain syndrome: Secondary | ICD-10-CM | POA: Insufficient documentation

## 2023-08-24 DIAGNOSIS — M25552 Pain in left hip: Secondary | ICD-10-CM | POA: Insufficient documentation

## 2023-08-24 MED ORDER — GABAPENTIN 100 MG PO CAPS: 100.0000 mg | ORAL_CAPSULE | Freq: Every morning | ORAL | 0 refills | Status: DC

## 2023-08-24 MED ORDER — HYDROCODONE-ACETAMINOPHEN 10-325 MG PO TABS: 1.0000 | ORAL_TABLET | Freq: Three times a day (TID) | ORAL | 0 refills | Status: DC | PRN

## 2023-08-31 ENCOUNTER — Other Ambulatory Visit (HOSPITAL_COMMUNITY): Payer: Self-pay | Admitting: Pharmacist

## 2023-08-31 NOTE — Progress Notes (Signed)
 Provided Uptravi  titration orders to Laird Pih, nurse with [facility].   Luster Salters, PharmD, BCPS, BCCP, CPP [facility] Failure [facility] Pharmacist [phone]

## 2023-09-13 ENCOUNTER — Encounter: Payer: Medicare Other | Admitting: Internal Medicine

## 2023-09-14 ENCOUNTER — Ambulatory Visit (HOSPITAL_BASED_OUTPATIENT_CLINIC_OR_DEPARTMENT_OTHER): Admitting: Emergency Medicine

## 2023-09-14 ENCOUNTER — Encounter: Payer: Self-pay | Admitting: Adult Health

## 2023-09-14 ENCOUNTER — Ambulatory Visit: Admitting: Adult Health

## 2023-09-14 VITALS — BP 108/52 | HR 83

## 2023-09-14 DIAGNOSIS — I272 Pulmonary hypertension, unspecified: Secondary | ICD-10-CM

## 2023-09-14 DIAGNOSIS — J4540 Moderate persistent asthma, uncomplicated: Secondary | ICD-10-CM

## 2023-09-14 DIAGNOSIS — Z9981 Dependence on supplemental oxygen: Secondary | ICD-10-CM

## 2023-09-14 DIAGNOSIS — J9611 Chronic respiratory failure with hypoxia: Secondary | ICD-10-CM | POA: Diagnosis not present

## 2023-09-14 DIAGNOSIS — R0683 Snoring: Secondary | ICD-10-CM

## 2023-09-14 DIAGNOSIS — I5032 Chronic diastolic (congestive) heart failure: Secondary | ICD-10-CM | POA: Diagnosis not present

## 2023-09-14 LAB — PULMONARY FUNCTION TEST
DL/VA % pred: 100 %
DL/VA: 4.26 ml/min/mmHg/L
DLCO cor % pred: 56 %
DLCO cor: 9.25 ml/min/mmHg
DLCO unc % pred: 53 %
DLCO unc: 8.76 ml/min/mmHg
FEF 25-75 Post: 0.43 L/s
FEF 25-75 Pre: 0.39 L/s
FEF2575-%Change-Post: 9 %
FEF2575-%Pred-Post: 29 %
FEF2575-%Pred-Pre: 26 %
FEV1-%Change-Post: 3 %
FEV1-%Pred-Post: 39 %
FEV1-%Pred-Pre: 38 %
FEV1-Post: 0.69 L
FEV1-Pre: 0.66 L
FEV1FVC-%Change-Post: 0 %
FEV1FVC-%Pred-Pre: 87 %
FEV6-%Change-Post: 4 %
FEV6-%Pred-Post: 47 %
FEV6-%Pred-Pre: 45 %
FEV6-Post: 1.05 L
FEV6-Pre: 1 L
FEV6FVC-%Pred-Post: 105 %
FEV6FVC-%Pred-Pre: 105 %
FVC-%Change-Post: 4 %
FVC-%Pred-Post: 45 %
FVC-%Pred-Pre: 43 %
FVC-Post: 1.05 L
FVC-Pre: 1 L
Post FEV1/FVC ratio: 65 %
Post FEV6/FVC ratio: 100 %
Pre FEV1/FVC ratio: 66 %
Pre FEV6/FVC Ratio: 100 %
RV % pred: 82 %
RV: 1.68 L
TLC % pred: 61 %
TLC: 2.71 L

## 2023-09-14 NOTE — Assessment & Plan Note (Signed)
 Encouraged on Oxygen  use.   Needs O2 3l/m with activity and At bedtime

## 2023-09-14 NOTE — Assessment & Plan Note (Signed)
 Continue on current regimen with cardiology  VQ scan neg for chronic PE, HRCT chest neg for ILD  Keep follow up with rheumatology for RA.  Check sleep study for possible sleep apnea.  Plan  . Patient Instructions  Continue on BREO 1 puff daily, rinse after use.  Albuterol  inhaler or Duoneb As needed   Saline nasal gel Twice daily   Continue on Oxygen  3l/m with activity and At bedtime  -goal is to keep O2 sats >88-90% Continue on current regimen with Cardiology  Set up for split night sleep study  Follow up with Dr. Baldwin Levee or Taysen Bushart NP in 3 months and As needed

## 2023-09-14 NOTE — Patient Instructions (Addendum)
 Continue on BREO 1 puff daily, rinse after use.  Albuterol  inhaler or Duoneb As needed   Saline nasal gel Twice daily   Continue on Oxygen  3l/m with activity and At bedtime  -goal is to keep O2 sats >88-90% Continue on current regimen with Cardiology  Set up for split night sleep study  Follow up with Dr. Baldwin Levee or Malon Branton NP in 3 months and As needed

## 2023-09-14 NOTE — Assessment & Plan Note (Signed)
 Continue on current regimen and follow up with cards

## 2023-09-14 NOTE — Assessment & Plan Note (Addendum)
 Controlled on present regimen-PFT are stable.   Plan  Patient Instructions  Continue on BREO 1 puff daily, rinse after use.  Albuterol  inhaler or Duoneb As needed   Saline nasal gel Twice daily   Continue on Oxygen  3l/m with activity and At bedtime  -goal is to keep O2 sats >88-90% Continue on current regimen with Cardiology  Set up for split night sleep study  Follow up with Dr. Baldwin Levee or Zyhir Cappella NP in 3 months and As needed

## 2023-09-14 NOTE — Progress Notes (Signed)
 Full PFT performed today.

## 2023-09-14 NOTE — Patient Instructions (Signed)
 Full PFT performed today.

## 2023-09-14 NOTE — Progress Notes (Signed)
 @Patient  ID: Joselyn Nicely, female    DOB: 1951-01-19, 73 y.o.   MRN: [id number]  Chief Complaint  Patient presents with   Follow-up    Referring provider: Drema Genta, NP  HPI: 73 yo female never smoker followed for Asthma, Restrictive lung disease-severe scoliosis, Chronic respiratory failure on O2 Medical history significant for breast cancer status postlumpectomy and tamoxifen ENT evaluation with paralyzed VC  Medical history significant for coronary artery disease, atrial septum aneurysm, congestive heart failure , pulmonary hypertension followed by cardiology, severe scoliosis   TEST/EVENTS :  CT chest Sep 10, 2021 clear lungs except for subsegmental atelectasis in the left lower lobe.  Elevation of bilateral hemidiaphragms left greater than right   2D echo April 14, 2022 EF 6065%, moderate LVH, grade 1 diastolic dysfunction moderately elevated pulmonary artery systolic pressure   Spirometry 2017 showed severe airflow obstruction FEV1 39%, ratio 56  09/14/2023 Follow up ; Asthma , O2 RF , PAH  Patient presents for a 6 month follow up. Has chronic obstructive asthma. Maintained on Breo daily. Feels breathing is doing okay with no flare of cough or wheezing. Walks with a walker, gets winded with heavy activity at baseline . PFT done today show stable lung function with severe airflow obstruction. -no significant change since 2017   Is followed by Cardiology for Diastolic heart failure and Pulmonary Hypertension. Underwent Right heart cath 03/28/23 that showed severe pulmonary hypertension. Referred to Rheumatology as she has a history of RA and Raynauds. HRCT chest was negative for ILD. VQ scan showed no evidence of chronic PE. She has been started on Opsumit  and Tadalafil . Says overall is tolerating okay.   She is on Oxygen  with activity and At bedtime  at 3l/m. Admits she does not wear with activity that much. Walk test today in office shows O2 sats drop 83% with short walk on  room. O2 at 3lm >88%.,  We discussed importance of Oxygen  compliance.   She has snoring and restless sleep . No previous sleep study in past.     Allergies  Allergen Reactions   Fluticasone -Salmeterol Other (See Comments)    REACTION: headache.   She is tolerating Dulera  well.  Other reaction(s): Other (See Comments)  Other Reaction(s): Other (See Comments)  REACTION: headache. , She is tolerating Dulera  well.   Norvasc  [Amlodipine  Besylate] Swelling and Other (See Comments)    edema   Tape Other (See Comments)    Prefers paper tape Other reaction(s): Other (See Comments) Prefers paper tape- "everything else rips my skin"   Heparin     Morphine  Nausea Only    Immunization History  Administered Date(s) Administered   Fluad Quad(high Dose 65+) 01/04/2019, 01/20/2022   Fluad Trivalent(High Dose 65+) 02/07/2023   Influenza Inj Mdck Quad Pf 04/09/2021   Influenza Split 03/23/2011, 02/15/2012   Influenza Whole 02/12/2003, 12/24/2009   Influenza, High Dose Seasonal PF 01/06/2016, 01/10/2017, 03/16/2018   Influenza,inj,Quad PF,6+ Mos 01/29/2013, 01/03/2014, 02/11/2015   Influenza-Unspecified 04/09/2021   Moderna Covid-19 Vaccine Bivalent Booster 16yrs & up 03/26/2021   Moderna SARS-COV2 Booster Vaccination 04/10/2020   Moderna Sars-Covid-2 Vaccination 06/21/2019, 07/24/2019   Pneumococcal Conjugate-13 05/06/2015   Pneumococcal Polysaccharide-23 07/06/2016   Tdap 01/09/2019    Past Medical History:  Diagnosis Date   Anemia    hx   Arthritis    Asthma    PFTs, February, 2011, moderate obstructive disease with response to bronchodilators, normal lung volumes, moderate reduction in diffusing capacity   Atrial septal aneurysm  Echo, 2008-not noted on 13 echo   CAD (coronary artery disease)    90% distal LAD in the past  /   nuclear, 2008, no ischemia, ejection fraction 70%   Cancer (HCC) 2002   DUCTAL CIS--S/P LUMPECTOMY, RADIATION AND 6 WEEKS OF TAMOXIFEN   D-dimer,  elevated    January, 2014   Depression    Ejection fraction    EF 60%, echo, October, 2008   Elevated CPK    January, 2014   Endometriosis 1989   RIGHT TUBE   Endometriosis 1987   LEFT TUBE/OVARY W FOCAL IN-SITU ENDOMETRIAL ADENOCARCINOMA   GERD (gastroesophageal reflux disease)    occ   H/O hiatal hernia    ?   High cholesterol    Hyperlipidemia    Hypertension    Hypothyroidism    Patient has had in the past that she does not need treatment   Kyphoscoliosis    Obstructive airway disease (HCC)    Pinched nerve    lower back   Shortness of breath    Echo 2/22: EF 60-65, no RWMA, mild LVH, GR 1  DD, GLS -24%, normal RVSF, mild MR, trivial AI, borderline dilation of aortic root (39 mm)   UTI (lower urinary tract infection)     Tobacco History: Social History   Tobacco Use  Smoking Status Never   Passive exposure: Never  Smokeless Tobacco Never   Counseling given: Not Answered   Outpatient Medications Prior to Visit  Medication Sig Dispense Refill   albuterol  (VENTOLIN  HFA) 108 (90 Base) MCG/ACT inhaler Inhale 2 puffs into the lungs every 4 (four) hours as needed for wheezing or shortness of breath. 1 each 11   aspirin  EC 81 MG tablet Take 1 tablet (81 mg total) by mouth daily. Swallow whole. 90 tablet 3   atorvastatin  (LIPITOR) 20 MG tablet Take 1 tablet (20 mg total) by mouth daily. 90 tablet 3   bisoprolol  (ZEBETA ) 5 MG tablet TAKE 1 TABLET BY MOUTH DAILY 90 tablet 1   clobetasol  cream (TEMOVATE ) 0.05 % Apply 1 Application topically as needed.     DULoxetine  (CYMBALTA ) 30 MG capsule TAKE ONE CAPSULE EVERY DAY IN ADDITION TO 60MG  CAPSULE FOR A TOTAL OF 90MG  DAILY 90 capsule 1   DULoxetine  (CYMBALTA ) 60 MG capsule TAKE ONE CAPSULE BY MOUTH EVERY DAY TAKE ALONG WITH 30MG  CAPSULE. TOTAL DAILY DOSE is 90MG . 90 capsule 1   famotidine  (PEPCID ) 20 MG tablet TAKE ONE TABLET EVERY DAY AFTER SUPPER 90 tablet 2   fluticasone  furoate-vilanterol (BREO ELLIPTA ) 100-25 MCG/ACT AEPB  INHALE 1 PUFF INTO THE LUNGS DAILY 60 each 11   furosemide  (LASIX ) 20 MG tablet Take 2 tablets (40 mg total) by mouth daily. 180 tablet 3   gabapentin  (NEURONTIN ) 100 MG capsule Take 1 capsule (100 mg total) by mouth in the morning. 30 capsule 0   gabapentin  (NEURONTIN ) 600 MG tablet TAKE ONE TABLET BY MOUTH EVERY DAY AT BEDTIME 90 tablet 1   HYDROcodone -acetaminophen  (NORCO) 10-325 MG tablet Take 1 tablet by mouth 3 (three) times daily as needed. Do Not Fill Before 09/07/2023 90 tablet 0   ipratropium-albuterol  (DUONEB) 0.5-2.5 (3) MG/3ML SOLN Take 3 mLs by nebulization every 6 (six) hours as needed (wheezing or SOB). During exacerbation 120 mL 3   levothyroxine  (SYNTHROID ) 100 MCG tablet Take 1 tablet (100 MCG) by mouth daily 6 days a week.  Take 1/2 tablet by mouth 1 day a week     macitentan  (OPSUMIT ) 10 MG  tablet Take 1 tablet (10 mg total) by mouth daily.     Selexipag  (UPTRAVI  TITRATION) 200 & 800 MCG TBPK Take 200 mcg by mouth twice daily. Increase as tolerated to target dose of 1600 mcg BID.     tadalafil , PAH, (ADCIRCA ) 20 MG tablet Take 2 tablets (40 mg total) by mouth daily. 60 tablet 11   No facility-administered medications prior to visit.     Review of Systems:   Constitutional:   No  weight loss, night sweats,  Fevers, chills,+ fatigue, or  lassitude.  HEENT:   No headaches,  Difficulty swallowing,  Tooth/dental problems, or  Sore throat,                No sneezing, itching, ear ache, nasal congestion, post nasal drip,   CV:  No chest pain,  Orthopnea, PND, swelling in lower extremities, anasarca, dizziness, palpitations, syncope.   GI  No heartburn, indigestion, abdominal pain, nausea, vomiting, diarrhea, change in bowel habits, loss of appetite, bloody stools.   Resp:   No chest wall deformity  Skin: no rash or lesions.  GU: no dysuria, change in color of urine, no urgency or frequency.  No flank pain, no hematuria   MS:  Arthritis in hands and back.    Physical  Exam  BP (!) 108/52 (BP Location: Right Arm, Cuff Size: Normal)   Pulse 83   SpO2 92%   GEN: A/Ox3; pleasant , NAD, well nourished, elderly , walker    HEENT:  [place]/AT,  NOSE-clear, THROAT-clear, no lesions, no postnasal drip or exudate noted.   NECK:  Supple w/ fair ROM; no JVD; normal carotid impulses w/o bruits; no thyromegaly or nodules palpated; no lymphadenopathy.    RESP  Clear  P & A; w/o, wheezes/ rales/ or rhonchi. no accessory muscle use, no dullness to percussion-scoliosis noted.   CARD:  RRR, no m/r/g, tr  peripheral edema, pulses intact, no cyanosis or clubbing.  GI:   Soft & nt; nml bowel sounds; no organomegaly or masses detected.   Musco: Warm bil, no deformities or joint swelling noted.   Neuro: alert, no focal deficits noted.    Skin: Warm, no lesions or rashes    Lab Results:    BMET   Imaging: No results found.  cyanocobalamin  (VITAMIN B12) injection 1,000 mcg     Date Action Dose Route User   08/08/2023 1527 Given 1,000 mcg Intramuscular (Left Deltoid) Katherene Pals, CMA          Latest Ref Rng & Units 09/14/2023    1:36 PM  PFT Results  FVC-Pre L 1.00  P  FVC-Predicted Pre % 43  P  FVC-Post L 1.05  P  FVC-Predicted Post % 45  P  Pre FEV1/FVC % % 66  P  Post FEV1/FCV % % 65  P  FEV1-Pre L 0.66  P  FEV1-Predicted Pre % 38  P  FEV1-Post L 0.69  P  DLCO uncorrected ml/min/mmHg 8.76  P  DLCO UNC% % 53  P  DLCO corrected ml/min/mmHg 9.25  P  DLCO COR %Predicted % 56  P  DLVA Predicted % 100  P  TLC L 2.71  P  TLC % Predicted % 61  P  RV % Predicted % 82  P    P Preliminary result    No results found for: "NITRICOXIDE"      Assessment & Plan:   Moderate persistent asthma Controlled on present regimen-PFT are stable.   Plan  Patient Instructions  Continue on BREO 1 puff daily, rinse after use.  Albuterol  inhaler or Duoneb As needed   Saline nasal gel Twice daily   Continue on Oxygen  3l/m with activity and At bedtime   -goal is to keep O2 sats >88-90% Continue on current regimen with Cardiology  Set up for split night sleep study  Follow up with Dr. Baldwin Levee or Tanesha Arambula NP in 3 months and As needed        Chronic respiratory failure (HCC) Encouraged on Oxygen  use.   Needs O2 3l/m with activity and At bedtime    Chronic diastolic heart failure (HCC) Continue on current regimen and follow up with cards   Severe pulmonary hypertension (HCC) Continue on current regimen with cardiology  VQ scan neg for chronic PE, HRCT chest neg for ILD  Keep follow up with rheumatology for RA.  Check sleep study for possible sleep apnea.  Plan  . Patient Instructions  Continue on BREO 1 puff daily, rinse after use.  Albuterol  inhaler or Duoneb As needed   Saline nasal gel Twice daily   Continue on Oxygen  3l/m with activity and At bedtime  -goal is to keep O2 sats >88-90% Continue on current regimen with Cardiology  Set up for split night sleep study  Follow up with Dr. Baldwin Levee or Hilarie Sinha NP in 3 months and As needed         I spent 41   minutes dedicated to the care of this patient on the date of this encounter to include pre-visit review of records, face-to-face time with the patient discussing conditions above, post visit ordering of testing, clinical documentation with the electronic health record, making appropriate referrals as documented, and communicating necessary findings to members of the patients care team.   Roena Clark, NP 09/14/2023

## 2023-09-20 DIAGNOSIS — H524 Presbyopia: Secondary | ICD-10-CM | POA: Diagnosis not present

## 2023-09-21 DIAGNOSIS — R0609 Other forms of dyspnea: Secondary | ICD-10-CM | POA: Diagnosis not present

## 2023-09-21 DIAGNOSIS — J4540 Moderate persistent asthma, uncomplicated: Secondary | ICD-10-CM | POA: Diagnosis not present

## 2023-09-22 DIAGNOSIS — H35341 Macular cyst, hole, or pseudohole, right eye: Secondary | ICD-10-CM | POA: Diagnosis not present

## 2023-09-22 DIAGNOSIS — H59812 Chorioretinal scars after surgery for detachment, left eye: Secondary | ICD-10-CM | POA: Diagnosis not present

## 2023-09-22 DIAGNOSIS — H35372 Puckering of macula, left eye: Secondary | ICD-10-CM | POA: Diagnosis not present

## 2023-09-22 DIAGNOSIS — H34832 Tributary (branch) retinal vein occlusion, left eye, with macular edema: Secondary | ICD-10-CM | POA: Diagnosis not present

## 2023-10-02 ENCOUNTER — Ambulatory Visit: Payer: Self-pay | Admitting: Adult Health

## 2023-10-02 ENCOUNTER — Encounter: Payer: Self-pay | Admitting: Registered Nurse

## 2023-10-02 ENCOUNTER — Encounter: Attending: Registered Nurse | Admitting: Registered Nurse

## 2023-10-02 VITALS — BP 112/63 | HR 83 | Ht <= 58 in | Wt 128.2 lb

## 2023-10-02 DIAGNOSIS — Z5181 Encounter for therapeutic drug level monitoring: Secondary | ICD-10-CM

## 2023-10-02 DIAGNOSIS — M48062 Spinal stenosis, lumbar region with neurogenic claudication: Secondary | ICD-10-CM

## 2023-10-02 DIAGNOSIS — M24551 Contracture, right hip: Secondary | ICD-10-CM

## 2023-10-02 DIAGNOSIS — G8929 Other chronic pain: Secondary | ICD-10-CM | POA: Diagnosis not present

## 2023-10-02 DIAGNOSIS — M792 Neuralgia and neuritis, unspecified: Secondary | ICD-10-CM

## 2023-10-02 DIAGNOSIS — G894 Chronic pain syndrome: Secondary | ICD-10-CM | POA: Diagnosis not present

## 2023-10-02 DIAGNOSIS — M25562 Pain in left knee: Secondary | ICD-10-CM | POA: Insufficient documentation

## 2023-10-02 DIAGNOSIS — M25511 Pain in right shoulder: Secondary | ICD-10-CM | POA: Insufficient documentation

## 2023-10-02 DIAGNOSIS — M25561 Pain in right knee: Secondary | ICD-10-CM | POA: Diagnosis not present

## 2023-10-02 DIAGNOSIS — Z79891 Long term (current) use of opiate analgesic: Secondary | ICD-10-CM

## 2023-10-02 MED ORDER — HYDROCODONE-ACETAMINOPHEN 10-325 MG PO TABS: 1.0000 | ORAL_TABLET | Freq: Three times a day (TID) | ORAL | 0 refills | Status: DC | PRN

## 2023-10-02 MED ORDER — GABAPENTIN 100 MG PO CAPS: 100.0000 mg | ORAL_CAPSULE | Freq: Every morning | ORAL | 0 refills | Status: DC

## 2023-10-02 NOTE — Progress Notes (Signed)
 Subjective:    Patient ID: Hannah Scott, female    DOB: 1951/02/08, 73 y.o.   MRN: [id number]  HPI: Hannah Scott is a 73 y.o. female who returns for follow up appointment for chronic pain and medication refill. She states her pain is located in her right shoulder, right thumb, right index and right middle finger with numbness. Also reports lower back pain, right hip and bilateral knee pain.She  rates her pain 8. Her current exercise regime is walking and performing stretching exercises.  [name] Morphine  equivalent is 30.00 MME.   Last Oral swab was Performed on 07/20/2023, it was consistent.     Pain Inventory Average Pain 7 Pain Right Now 8 My pain is constant, burning, tingling, and aching  In the last 24 hours, has pain interfered with the following? General activity 10 Relation with others 10 Enjoyment of life 10 What TIME of day is your pain at its worst? morning , daytime, evening, and night Sleep (in general) Poor  Pain is worse with: walking, bending, and standing Pain improves with: rest, heat/ice, and medication Relief from Meds: 6  Family History  Problem Relation Age of Onset   Heart failure Mother    Pneumonia Mother    Hypertension Mother    Heart disease Mother    Stroke Maternal Grandfather    Hypertension Maternal Grandfather    Heart attack Father    Heart disease Father    Asthma Paternal Uncle        PAT UNCLES   Heart attack Paternal Uncle    Social History   Socioeconomic History   Marital status: Widowed    Spouse name: Not on file   Number of children: Not on file   Years of education: Not on file   Highest education level: Not on file  Occupational History   Occupation: RETIRED  Tobacco Use   Smoking status: Never    Passive exposure: Never   Smokeless tobacco: Never  Vaping Use   Vaping status: Never Used  Substance and Sexual Activity   Alcohol  use: No    Alcohol /week: 0.0 standard drinks of alcohol    Drug use: No   Sexual  activity: Not Currently    Partners: Male    Birth control/protection: Surgical  Other Topics Concern   Not on file  Social History Narrative   Widowed in 2015   2 sons 1 lives in the area the other is in close contact /2025   1 grandchild   Never smoker no drug use no alcohol    Social Drivers of Corporate investment banker Strain: Medium Risk (08/01/2023)   Overall Financial Resource Strain (CARDIA)    Difficulty of Paying Living Expenses: Somewhat hard  Food Insecurity: No Food Insecurity (08/01/2023)   Hunger Vital Sign    Worried About Running Out of Food in the Last Year: Never true    Ran Out of Food in the Last Year: Never true  Transportation Needs: No Transportation Needs (08/01/2023)   PRAPARE - Administrator, Civil Service (Medical): No    Lack of Transportation (Non-Medical): No  Physical Activity: Inactive (08/01/2023)   Exercise Vital Sign    Days of Exercise per Week: 0 days    Minutes of Exercise per Session: 0 min  Stress: Stress Concern Present (08/01/2023)   [employer] of Occupational Health - Occupational Stress Questionnaire    Feeling of Stress : To some extent  Social Connections: Socially Isolated (  08/01/2023)   Social Connection and Isolation Panel [NHANES]    Frequency of Communication with Friends and Family: More than three times a week    Frequency of Social Gatherings with Friends and Family: Once a week    Attends Religious Services: Never    Database administrator or Organizations: No    Attends Banker Meetings: Never    Marital Status: Widowed   Past Surgical History:  Procedure Laterality Date   ANKLE SURGERY Left    ligament   APPENDECTOMY  1987   AT [facility]   BACK SURGERY     Fusion   BREAST LUMPECTOMY  2003   radiation on right   BREAST LUMPECTOMY WITH RADIOACTIVE SEED LOCALIZATION Left 02/12/2016   Procedure: LEFT BREAST LUMPECTOMY WITH RADIOACTIVE SEED LOCALIZATION;  Surgeon: Ayesha Lente, MD;  Location:  [facility];  Service: General;  Laterality: Left;   CARDIOVASCULAR STRESS TEST  09/07/05   Nuclear, was negative   CATARACT EXTRACTION Bilateral 01,03   COLONOSCOPY WITH PROPOFOL  N/A 05/04/2021   Procedure: COLONOSCOPY WITH PROPOFOL ;  Surgeon: Kenney Peacemaker, MD;  Location: [facility];  Service: Endoscopy;  Laterality: N/A;   ESOPHAGOGASTRODUODENOSCOPY (EGD) WITH PROPOFOL  N/A 05/04/2021   Procedure: ESOPHAGOGASTRODUODENOSCOPY (EGD) WITH PROPOFOL ;  Surgeon: Kenney Peacemaker, MD;  Location: [facility];  Service: Endoscopy;  Laterality: N/A;   OOPHORECTOMY     LSO -RSO   PELVIC LAPAROSCOPY  1989   RSO, LYSIS OF ADHESIONS   POLYPECTOMY  05/04/2021   Procedure: POLYPECTOMY;  Surgeon: Kenney Peacemaker, MD;  Location: [facility];  Service: Endoscopy;;   RIGHT/LEFT HEART CATH AND CORONARY ANGIOGRAPHY N/A 03/28/2023   Procedure: RIGHT/LEFT HEART CATH AND CORONARY ANGIOGRAPHY;  Surgeon: Darlis Eisenmenger, MD;  Location: [facility] INVASIVE CV LAB;  Service: Cardiovascular;  Laterality: N/A;   TOTAL ABDOMINAL HYSTERECTOMY  1987   LSO, APPENDECTOMY   TOTAL HIP ARTHROPLASTY Right 10/28/2013   dr Murrel Arnt   TOTAL HIP ARTHROPLASTY Right 10/28/2013   Procedure: TOTAL HIP ARTHROPLASTY ANTERIOR APPROACH;  Surgeon: Adah Acron, MD;  Location: [facility];  Service: Orthopedics;  Laterality: Right;  Right Total Hip Arthroplasty, Direct Anterior Approach   Past Surgical History:  Procedure Laterality Date   ANKLE SURGERY Left    ligament   APPENDECTOMY  1987   AT [facility]   BACK SURGERY     Fusion   BREAST LUMPECTOMY  2003   radiation on right   BREAST LUMPECTOMY WITH RADIOACTIVE SEED LOCALIZATION Left 02/12/2016   Procedure: LEFT BREAST LUMPECTOMY WITH RADIOACTIVE SEED LOCALIZATION;  Surgeon: Ayesha Lente, MD;  Location: [facility];  Service: General;  Laterality: Left;   CARDIOVASCULAR STRESS TEST  09/07/05   Nuclear, was negative   CATARACT EXTRACTION Bilateral 01,03   COLONOSCOPY WITH  PROPOFOL  N/A 05/04/2021   Procedure: COLONOSCOPY WITH PROPOFOL ;  Surgeon: Kenney Peacemaker, MD;  [facility]: [facility];  Service: Endoscopy;  Laterality: N/A;   ESOPHAGOGASTRODUODENOSCOPY (EGD) WITH PROPOFOL  N/A 05/04/2021   Procedure: ESOPHAGOGASTRODUODENOSCOPY (EGD) WITH PROPOFOL ;  Surgeon: Kenney Peacemaker, MD;  [facility]: [facility];  Service: Endoscopy;  Laterality: N/A;   OOPHORECTOMY     LSO -RSO   PELVIC LAPAROSCOPY  1989   RSO, LYSIS OF ADHESIONS   POLYPECTOMY  05/04/2021   Procedure: POLYPECTOMY;  Surgeon: Kenney Peacemaker, MD;  [facility];  Service: Endoscopy;;   RIGHT/LEFT HEART CATH AND CORONARY ANGIOGRAPHY N/A 03/28/2023   Procedure: RIGHT/LEFT HEART CATH AND CORONARY ANGIOGRAPHY;  Surgeon: Darlis Eisenmenger, MD;  Location: [facility] INVASIVE CV LAB;  Service: Cardiovascular;  Laterality: N/A;   TOTAL ABDOMINAL HYSTERECTOMY  1987   LSO, APPENDECTOMY   TOTAL HIP ARTHROPLASTY Right 10/28/2013   dr Murrel Arnt   TOTAL HIP ARTHROPLASTY Right 10/28/2013   Procedure: TOTAL HIP ARTHROPLASTY ANTERIOR APPROACH;  Surgeon: Adah Acron, MD;  Location: [facility];  Service: Orthopedics;  Laterality: Right;  Right Total Hip Arthroplasty, Direct Anterior Approach   Past Medical History:  Diagnosis Date   Anemia    hx   Arthritis    Asthma    PFTs, February, 2011, moderate obstructive disease with response to bronchodilators, normal lung volumes, moderate reduction in diffusing capacity   Atrial septal aneurysm    Echo, 2008-not noted on 13 echo   CAD (coronary artery disease)    90% distal LAD in the past  /   nuclear, 2008, no ischemia, ejection fraction 70%   Cancer (HCC) 2002   DUCTAL CIS--S/P LUMPECTOMY, RADIATION AND 6 WEEKS OF TAMOXIFEN   D-dimer, elevated    January, 2014   Depression    Ejection fraction    EF 60%, echo, October, 2008   Elevated CPK    January, 2014   Endometriosis 1989   RIGHT TUBE   Endometriosis 1987   LEFT TUBE/OVARY W FOCAL IN-SITU ENDOMETRIAL ADENOCARCINOMA    GERD (gastroesophageal reflux disease)    occ   H/O hiatal hernia    ?   High cholesterol    Hyperlipidemia    Hypertension    Hypothyroidism    Patient has had in the past that she does not need treatment   Kyphoscoliosis    Obstructive airway disease (HCC)    Pinched nerve    lower back   Shortness of breath    Echo 2/22: EF 60-65, no RWMA, mild LVH, GR 1  DD, GLS -24%, normal RVSF, mild MR, trivial AI, borderline dilation of aortic root (39 mm)   UTI (lower urinary tract infection)    BP 112/63   Pulse 83   Ht 4' 9.5 (1.461 m)   Wt 128 lb 3.2 oz (58.2 kg)   SpO2 90%   BMI 27.26 kg/m   Opioid Risk Score:   Fall Risk Score:  `1  Depression screen PHQ 2/9     10/02/2023    2:05 PM 08/24/2023    2:14 PM 08/01/2023    3:51 PM 06/20/2023    1:37 PM 04/14/2023   10:53 AM 03/16/2023    2:16 PM 01/03/2023    2:31 PM  Depression screen PHQ 2/9  Decreased Interest 0 1 2 0 0 0 1  Down, Depressed, Hopeless 0 1 3 0 0 0 1  PHQ - 2 Score 0 2 5 0 0 0 2  Altered sleeping   1      Tired, decreased energy   1      Change in appetite   0      Feeling bad or failure about yourself    3      Trouble concentrating   1      Moving slowly or fidgety/restless   0      Suicidal thoughts   0      PHQ-9 Score   11      Difficult doing work/chores   Somewhat difficult         Review of Systems  Musculoskeletal:  Positive for back pain.  Right arm and bilat knees  All other systems reviewed and are negative.      Objective:   Physical Exam Vitals and nursing note reviewed.  Constitutional:      Appearance: Normal appearance.   Cardiovascular:     Rate and Rhythm: Normal rate and regular rhythm.     Pulses: Normal pulses.     Heart sounds: Normal heart sounds.  Pulmonary:     Effort: Pulmonary effort is normal.     Breath sounds: Normal breath sounds.   Musculoskeletal:     Comments: Normal Muscle Bulk and Muscle Testing Reveals:  Upper Extremities: Right Decreased ROM  90 Degrees and Muscle Strength 5/5 Right AC Joint Tenderness Left Upper Extremity: Full ROM and Muscle Strength 5/5 Lumbar Paraspinal Tenderness: L-4-L-5 Right Greater Trochanter Tenderness Lower Extremities: Full ROM and Muscle Strength 5/5 Arises from Table slowly using walker for support Antalgic  Gait      Skin:    General: Skin is warm and dry.   Neurological:     Mental Status: She is alert and oriented to person, place, and time.   Psychiatric:        Mood and Affect: Mood normal.        Behavior: Behavior normal.         Assessment & Plan:  1. Lumbar post laminectomy syndrome with severe kyphoscoliosis thoracolumbar spine, s/p lumbar fusion. 10/02/2023 Continue current medication regimen. Refilled:  Hydrocodone  10/325mg  one tablet every 8 hours #90. We will continue the opioid monitoring program, this consists of regular clinic visits, examinations, urine drug screen, pill counts as well as use of [place]  Controlled Substance Reporting system. A 12 month History has been reviewed on the [place]  Controlled Substance Reporting System on 10/02/2023 2. Right Hip OA: S/P Right Hip Replacement 10/28/2013. 10/02/2023 3.Depression: Continue Cymbalta . Has Family Support and Friends.  Counseling with Geralynn Knife. 10/02/2023 4. Bilateral Hip Pain/ Right Greater Trochanteric Tenderness: No complaints today. Continue to Monitor. Continue with Ice and Heat Therapy. 10/02/2023 5. Poor Appetite/ Frail: No complaints today. Ms. Kitt states PCP following. Continue to monitor. 10/02/2023 6. Chronic Left  Knee Pain: Continue HEP as Tolerated. Continue to Monitor. 10/02/2023 7. Chronic Right  shoulder pain:  Continue HEP as tolerated. Continue to Monitor. 10/02/2023   F/U in 1 month

## 2023-10-05 ENCOUNTER — Other Ambulatory Visit: Payer: Self-pay | Admitting: Internal Medicine

## 2023-10-09 ENCOUNTER — Other Ambulatory Visit: Payer: Self-pay | Admitting: Internal Medicine

## 2023-10-16 ENCOUNTER — Other Ambulatory Visit: Payer: Self-pay | Admitting: Internal Medicine

## 2023-10-16 DIAGNOSIS — R0609 Other forms of dyspnea: Secondary | ICD-10-CM

## 2023-10-22 DIAGNOSIS — J4540 Moderate persistent asthma, uncomplicated: Secondary | ICD-10-CM | POA: Diagnosis not present

## 2023-10-22 DIAGNOSIS — R0609 Other forms of dyspnea: Secondary | ICD-10-CM | POA: Diagnosis not present

## 2023-10-25 DIAGNOSIS — H34832 Tributary (branch) retinal vein occlusion, left eye, with macular edema: Secondary | ICD-10-CM | POA: Diagnosis not present

## 2023-10-26 ENCOUNTER — Encounter: Admitting: Internal Medicine

## 2023-11-07 ENCOUNTER — Encounter: Attending: Registered Nurse | Admitting: Registered Nurse

## 2023-11-07 ENCOUNTER — Encounter: Payer: Self-pay | Admitting: Registered Nurse

## 2023-11-07 DIAGNOSIS — M25511 Pain in right shoulder: Secondary | ICD-10-CM | POA: Insufficient documentation

## 2023-11-07 DIAGNOSIS — Z5181 Encounter for therapeutic drug level monitoring: Secondary | ICD-10-CM | POA: Insufficient documentation

## 2023-11-07 DIAGNOSIS — M4185 Other forms of scoliosis, thoracolumbar region: Secondary | ICD-10-CM | POA: Diagnosis not present

## 2023-11-07 DIAGNOSIS — M48062 Spinal stenosis, lumbar region with neurogenic claudication: Secondary | ICD-10-CM | POA: Insufficient documentation

## 2023-11-07 DIAGNOSIS — M961 Postlaminectomy syndrome, not elsewhere classified: Secondary | ICD-10-CM

## 2023-11-07 DIAGNOSIS — Z79891 Long term (current) use of opiate analgesic: Secondary | ICD-10-CM | POA: Insufficient documentation

## 2023-11-07 DIAGNOSIS — G894 Chronic pain syndrome: Secondary | ICD-10-CM | POA: Insufficient documentation

## 2023-11-07 DIAGNOSIS — G8929 Other chronic pain: Secondary | ICD-10-CM | POA: Insufficient documentation

## 2023-11-07 DIAGNOSIS — M25562 Pain in left knee: Secondary | ICD-10-CM | POA: Insufficient documentation

## 2023-11-07 DIAGNOSIS — M25551 Pain in right hip: Secondary | ICD-10-CM | POA: Insufficient documentation

## 2023-11-07 DIAGNOSIS — M25552 Pain in left hip: Secondary | ICD-10-CM | POA: Insufficient documentation

## 2023-11-07 DIAGNOSIS — M24551 Contracture, right hip: Secondary | ICD-10-CM | POA: Insufficient documentation

## 2023-11-07 DIAGNOSIS — M792 Neuralgia and neuritis, unspecified: Secondary | ICD-10-CM | POA: Insufficient documentation

## 2023-11-07 MED ORDER — GABAPENTIN 100 MG PO CAPS: 100.0000 mg | ORAL_CAPSULE | Freq: Every morning | ORAL | 0 refills | Status: DC

## 2023-11-07 MED ORDER — HYDROCODONE-ACETAMINOPHEN 10-325 MG PO TABS: 1.0000 | ORAL_TABLET | Freq: Three times a day (TID) | ORAL | 0 refills | Status: DC | PRN

## 2023-11-07 NOTE — Progress Notes (Addendum)
 Subjective:    Patient ID: Hannah Scott, female    DOB: [birth year], 73 y.o.   MRN: [id number]  HPI: Hannah Scott is a 73 y.o. female who is scheduled for a Telephone follow up appointment for chronic pain and medication refill.   I connected with Ms. Genesis Boakye by telephone and verified that I am speaking with the correct person using two identifiers.  Location: Patient: In her Home Provider: In the Office    I discussed the limitations, risks, security and privacy concerns of performing an evaluation and management service by telephone and the availability of in person appointments. I also discussed with the patient that there may be a patient responsible charge related to this service. The patient expressed understanding and agreed to proceed.  She states her pain is located in her right shoulder, lower back, bilateral hips and left knee pain. She rates her pain 7.Her  current exercise regime is walking  with her walker or cane for short distances.   Ms. Godbee Morphine  equivalent is 30.00 MME.   Last Oral Swab was Performed on 06/30/2023, it was consistent.     Pain Inventory Average Pain 7 Pain Right Now 7 My pain is burning, tingling, and aching  In the last 24 hours, has pain interfered with the following? General activity 8 Relation with others 0 Enjoyment of life 10 What TIME of day is your pain at its worst? varies Sleep (in general) Poor  Pain is worse with: walking and standing Pain improves with: rest and medication Relief from Meds: 4  Family History  Problem Relation Age of Onset   Heart failure Mother    Pneumonia Mother    Hypertension Mother    Heart disease Mother    Stroke Maternal Grandfather    Hypertension Maternal Grandfather    Heart attack Father    Heart disease Father    Asthma Paternal Uncle        PAT UNCLES   Heart attack Paternal Uncle    Social History   Socioeconomic History   Marital status: Widowed    Spouse name: Not on file    Number of children: Not on file   Years of education: Not on file   Highest education level: Not on file  Occupational History   Occupation: RETIRED  Tobacco Use   Smoking status: Never    Passive exposure: Never   Smokeless tobacco: Never  Vaping Use   Vaping status: Never Used  Substance and Sexual Activity   Alcohol  use: No    Alcohol /week: 0.0 standard drinks of alcohol    Drug use: No   Sexual activity: Not Currently    Partners: Male    Birth control/protection: Surgical  Other Topics Concern   Not on file  Social History Narrative   Widowed in 2015   2 sons 1 lives in the area the other is in close contact /2025   1 grandchild   Never smoker no drug use no alcohol    Social Drivers of Corporate investment banker Strain: Medium Risk (08/01/2023)   Overall Financial Resource Strain (CARDIA)    Difficulty of Paying Living Expenses: Somewhat hard  Food Insecurity: No Food Insecurity (08/01/2023)   Hunger Vital Sign    Worried About Running Out of Food in the Last Year: Never true    Ran Out of Food in the Last Year: Never true  Transportation Needs: No Transportation Needs (08/01/2023)   PRAPARE - Transportation  Lack of Transportation (Medical): No    Lack of Transportation (Non-Medical): No  Physical Activity: Inactive (08/01/2023)   Exercise Vital Sign    Days of Exercise per Week: 0 days    Minutes of Exercise per Session: 0 min  Stress: Stress Concern Present (08/01/2023)   [employer] of Occupational Health - Occupational Stress Questionnaire    Feeling of Stress : To some extent  Social Connections: Socially Isolated (08/01/2023)   Social Connection and Isolation Panel    Frequency of Communication with Friends and Family: More than three times a week    Frequency of Social Gatherings with Friends and Family: Once a week    Attends Religious Services: Never    Database administrator or Organizations: No    Attends Banker Meetings: Never     Marital Status: Widowed   Past Surgical History:  Procedure Laterality Date   ANKLE SURGERY Left    ligament   APPENDECTOMY  1987   AT [facility]   BACK SURGERY     Fusion   BREAST LUMPECTOMY  2003   radiation on right   BREAST LUMPECTOMY WITH RADIOACTIVE SEED LOCALIZATION Left 02/12/2016   Procedure: LEFT BREAST LUMPECTOMY WITH RADIOACTIVE SEED LOCALIZATION;  Surgeon: Morene Olives, MD;  Location: [facility];  Service: General;  Laterality: Left;   CARDIOVASCULAR STRESS TEST  09/07/05   Nuclear, was negative   CATARACT EXTRACTION Bilateral 01,03   COLONOSCOPY WITH PROPOFOL  N/A 05/04/2021   Procedure: COLONOSCOPY WITH PROPOFOL ;  Surgeon: Avram Lupita BRAVO, MD;  Location: [facility];  Service: Endoscopy;  Laterality: N/A;   ESOPHAGOGASTRODUODENOSCOPY (EGD) WITH PROPOFOL  N/A 05/04/2021   Procedure: ESOPHAGOGASTRODUODENOSCOPY (EGD) WITH PROPOFOL ;  Surgeon: Avram Lupita BRAVO, MD;  Location: [facility];  Service: Endoscopy;  Laterality: N/A;   OOPHORECTOMY     LSO -RSO   PELVIC LAPAROSCOPY  1989   [facility], LYSIS OF ADHESIONS   POLYPECTOMY  05/04/2021   Procedure: POLYPECTOMY;  Surgeon: Avram Lupita BRAVO, MD;  Location: [facility];  Service: Endoscopy;;   RIGHT/LEFT HEART CATH AND CORONARY ANGIOGRAPHY N/A 03/28/2023   Procedure: RIGHT/LEFT HEART CATH AND CORONARY ANGIOGRAPHY;  Surgeon: Rolan Ezra RAMAN, MD;  Location: [facility] INVASIVE CV LAB;  Service: Cardiovascular;  Laterality: N/A;   TOTAL ABDOMINAL HYSTERECTOMY  1987   [facility], APPENDECTOMY   TOTAL HIP ARTHROPLASTY Right 10/28/2013   dr barbarann   TOTAL HIP ARTHROPLASTY Right 10/28/2013   Procedure: TOTAL HIP ARTHROPLASTY ANTERIOR APPROACH;  Surgeon: Oneil JAYSON barbarann, MD;  Location: [facility];  Service: Orthopedics;  Laterality: Right;  Right Total Hip Arthroplasty, Direct Anterior Approach   Past Surgical History:  Procedure Laterality Date   ANKLE SURGERY Left    ligament   APPENDECTOMY  1987   AT [facility]   BACK SURGERY     Fusion   BREAST  LUMPECTOMY  2003   radiation on right   BREAST LUMPECTOMY WITH RADIOACTIVE SEED LOCALIZATION Left 02/12/2016   Procedure: LEFT BREAST LUMPECTOMY WITH RADIOACTIVE SEED LOCALIZATION;  Surgeon: Morene Olives, MD;  Location: [facility];  Service: General;  Laterality: Left;   CARDIOVASCULAR STRESS TEST  09/07/05   Nuclear, was negative   CATARACT EXTRACTION Bilateral 01,03   COLONOSCOPY WITH PROPOFOL  N/A 05/04/2021   Procedure: COLONOSCOPY WITH PROPOFOL ;  Surgeon: Avram Lupita BRAVO, MD;  Location: [facility];  Service: Endoscopy;  Laterality: N/A;   ESOPHAGOGASTRODUODENOSCOPY (EGD) WITH PROPOFOL  N/A 05/04/2021   Procedure: ESOPHAGOGASTRODUODENOSCOPY (EGD) WITH PROPOFOL ;  Surgeon: Avram Lupita  E, MD;  Location: [facility];  Service: Endoscopy;  Laterality: N/A;   OOPHORECTOMY     LSO -RSO   PELVIC LAPAROSCOPY  1989   RSO, LYSIS OF ADHESIONS   POLYPECTOMY  05/04/2021   Procedure: POLYPECTOMY;  Surgeon: Avram Lupita BRAVO, MD;  Location: [facility];  Service: Endoscopy;;   RIGHT/LEFT HEART CATH AND CORONARY ANGIOGRAPHY N/A 03/28/2023   Procedure: RIGHT/LEFT HEART CATH AND CORONARY ANGIOGRAPHY;  Surgeon: Rolan Ezra RAMAN, MD;  Location: [facility] INVASIVE CV LAB;  Service: Cardiovascular;  Laterality: N/A;   TOTAL ABDOMINAL HYSTERECTOMY  1987   LSO, APPENDECTOMY   TOTAL HIP ARTHROPLASTY Right 10/28/2013   dr barbarann   TOTAL HIP ARTHROPLASTY Right 10/28/2013   Procedure: TOTAL HIP ARTHROPLASTY ANTERIOR APPROACH;  Surgeon: Oneil JAYSON barbarann, MD;  Location: [facility];  Service: Orthopedics;  Laterality: Right;  Right Total Hip Arthroplasty, Direct Anterior Approach   Past Medical History:  Diagnosis Date   Anemia    hx   Arthritis    Asthma    PFTs, February, 2011, moderate obstructive disease with response to bronchodilators, normal lung volumes, moderate reduction in diffusing capacity   Atrial septal aneurysm    Echo, 2008-not noted on 13 echo   CAD (coronary artery disease)    90% distal LAD  in the past  /   nuclear, 2008, no ischemia, ejection fraction 70%   Cancer (HCC) 2002   DUCTAL CIS--S/P LUMPECTOMY, RADIATION AND 6 WEEKS OF TAMOXIFEN   D-dimer, elevated    January, 2014   Depression    Ejection fraction    EF 60%, echo, October, 2008   Elevated CPK    January, 2014   Endometriosis 1989   RIGHT TUBE   Endometriosis 1987   LEFT TUBE/OVARY W FOCAL IN-SITU ENDOMETRIAL ADENOCARCINOMA   GERD (gastroesophageal reflux disease)    occ   H/O hiatal hernia    ?   High cholesterol    Hyperlipidemia    Hypertension    Hypothyroidism    Patient has had in the past that she does not need treatment   Kyphoscoliosis    Obstructive airway disease (HCC)    Pinched nerve    lower back   Shortness of breath    Echo 2/22: EF 60-65, no RWMA, mild LVH, GR 1  DD, GLS -24%, normal RVSF, mild MR, trivial AI, borderline dilation of aortic root (39 mm)   UTI (lower urinary tract infection)    There were no vitals taken for this visit.  Opioid Risk Score:   Fall Risk Score:  `1  Depression screen PHQ 2/9     10/02/2023    2:05 PM 08/24/2023    2:14 PM 08/01/2023    3:51 PM 06/20/2023    1:37 PM 04/14/2023   10:53 AM 03/16/2023    2:16 PM 01/03/2023    2:31 PM  Depression screen PHQ 2/9  Decreased Interest 0 1 2 0 0 0 1  Down, Depressed, Hopeless 0 1 3 0 0 0 1  PHQ - 2 Score 0 2 5 0 0 0 2  Altered sleeping   1      Tired, decreased energy   1      Change in appetite   0      Feeling bad or failure about yourself    3      Trouble concentrating   1      Moving slowly or fidgety/restless   0  Suicidal thoughts   0      PHQ-9 Score   11      Difficult doing work/chores   Somewhat difficult         Review of Systems  Musculoskeletal:  Positive for back pain and gait problem.       Right hand tingling B/L knee pain Right shoulder pain  All other systems reviewed and are negative.      Objective:   Physical Exam Vitals and nursing note reviewed.  Musculoskeletal:      Comments: No Physical Exam : Telephone visit          Assessment & Plan:  1. Lumbar post laminectomy syndrome with severe kyphoscoliosis thoracolumbar spine, s/p lumbar fusion. 11/07/2023 Continue current medication regimen. Refilled:  Hydrocodone  10/325mg  one tablet every 8 hours #90. We will continue the opioid monitoring program, this consists of regular clinic visits, examinations, urine drug screen, pill counts as well as use of [place]  Controlled Substance Reporting system. A 12 month History has been reviewed on the [place]  Controlled Substance Reporting System on 11/07/2023 2. Right Hip OA: S/P Right Hip Replacement 10/28/2013. 0715/2025 3.Depression: Continue Cymbalta . Has Family Support and Friends.  Counseling with Juliene. 11/07/2023 4. Bilateral Hip Pain/ Right Greater Trochanteric Tenderness: No complaints today. Continue to Monitor. Continue with Ice and Heat Therapy. 11/07/2023 5. Poor Appetite/ Frail: No complaints today. Ms. Krog states PCP following. Continue to monitor. 11/07/2023 6. Chronic Left  Knee Pain: Continue HEP as Tolerated. Continue to Monitor. 11/07/2023 7. Chronic Right  shoulder pain:  Continue HEP as tolerated. Continue to Monitor. 11/07/2023   F/U in 1 month  Telephone Visit Established Patient Location of Patient: In her Home Location of Provider: In the Office  Time :  10 Minutes

## 2023-11-10 ENCOUNTER — Encounter (HOSPITAL_COMMUNITY): Payer: Self-pay | Admitting: Cardiology

## 2023-11-10 ENCOUNTER — Ambulatory Visit (HOSPITAL_COMMUNITY)
Admission: RE | Admit: 2023-11-10 | Discharge: 2023-11-10 | Disposition: A | Discharge status: Home / Self Care | Source: Ambulatory Visit | Attending: Cardiology | Admitting: Cardiology

## 2023-11-10 ENCOUNTER — Ambulatory Visit (HOSPITAL_COMMUNITY): Payer: Self-pay | Admitting: Cardiology

## 2023-11-10 ENCOUNTER — Telehealth (HOSPITAL_COMMUNITY): Payer: Self-pay | Admitting: Pharmacy Technician

## 2023-11-10 VITALS — BP 148/82 | HR 82 | Ht 60.0 in | Wt 127.8 lb

## 2023-11-10 DIAGNOSIS — M419 Scoliosis, unspecified: Secondary | ICD-10-CM | POA: Diagnosis not present

## 2023-11-10 DIAGNOSIS — E785 Hyperlipidemia, unspecified: Secondary | ICD-10-CM | POA: Diagnosis not present

## 2023-11-10 DIAGNOSIS — I252 Old myocardial infarction: Secondary | ICD-10-CM | POA: Diagnosis not present

## 2023-11-10 DIAGNOSIS — J45909 Unspecified asthma, uncomplicated: Secondary | ICD-10-CM | POA: Diagnosis not present

## 2023-11-10 DIAGNOSIS — R918 Other nonspecific abnormal finding of lung field: Secondary | ICD-10-CM | POA: Diagnosis not present

## 2023-11-10 DIAGNOSIS — I110 Hypertensive heart disease with heart failure: Secondary | ICD-10-CM | POA: Insufficient documentation

## 2023-11-10 DIAGNOSIS — Z7982 Long term (current) use of aspirin: Secondary | ICD-10-CM | POA: Diagnosis not present

## 2023-11-10 DIAGNOSIS — I272 Pulmonary hypertension, unspecified: Secondary | ICD-10-CM | POA: Diagnosis not present

## 2023-11-10 DIAGNOSIS — M069 Rheumatoid arthritis, unspecified: Secondary | ICD-10-CM | POA: Insufficient documentation

## 2023-11-10 DIAGNOSIS — E278 Other specified disorders of adrenal gland: Secondary | ICD-10-CM | POA: Diagnosis not present

## 2023-11-10 DIAGNOSIS — I5032 Chronic diastolic (congestive) heart failure: Secondary | ICD-10-CM | POA: Diagnosis not present

## 2023-11-10 DIAGNOSIS — Z79899 Other long term (current) drug therapy: Secondary | ICD-10-CM | POA: Diagnosis not present

## 2023-11-10 DIAGNOSIS — I2510 Atherosclerotic heart disease of native coronary artery without angina pectoris: Secondary | ICD-10-CM | POA: Diagnosis not present

## 2023-11-10 DIAGNOSIS — I7300 Raynaud's syndrome without gangrene: Secondary | ICD-10-CM | POA: Diagnosis not present

## 2023-11-10 DIAGNOSIS — Z853 Personal history of malignant neoplasm of breast: Secondary | ICD-10-CM | POA: Diagnosis not present

## 2023-11-10 LAB — CBC
HCT: 36 % (ref 36.0–46.0)
Hemoglobin: 11.6 g/dL — ABNORMAL LOW (ref 12.0–15.0)
MCH: 30.5 pg (ref 26.0–34.0)
MCHC: 32.2 g/dL (ref 30.0–36.0)
MCV: 94.7 fL (ref 80.0–100.0)
Platelets: 156 K/uL (ref 150–400)
RBC: 3.8 MIL/uL — ABNORMAL LOW (ref 3.87–5.11)
RDW: 14.4 % (ref 11.5–15.5)
WBC: 3.7 K/uL — ABNORMAL LOW (ref 4.0–10.5)
nRBC: 0 % (ref 0.0–0.2)

## 2023-11-10 LAB — COMPREHENSIVE METABOLIC PANEL WITH GFR
ALT: 14 U/L (ref 0–44)
AST: 22 U/L (ref 15–41)
Albumin: 3.6 g/dL (ref 3.5–5.0)
Alkaline Phosphatase: 71 U/L (ref 38–126)
Anion gap: 7 (ref 5–15)
BUN: 19 mg/dL (ref 8–23)
CO2: 31 mmol/L (ref 22–32)
Calcium: 9.3 mg/dL (ref 8.9–10.3)
Chloride: 100 mmol/L (ref 98–111)
Creatinine, Ser: 0.88 mg/dL (ref 0.44–1.00)
GFR, Estimated: >60 mL/min (ref >60)
Glucose, Bld: 97 mg/dL (ref 70–99)
Potassium: 4.3 mmol/L (ref 3.5–5.1)
Sodium: 138 mmol/L (ref 135–145)
Total Bilirubin: 0.7 mg/dL (ref 0.0–1.2)
Total Protein: 5.9 g/dL — ABNORMAL LOW (ref 6.5–8.1)

## 2023-11-10 LAB — BRAIN NATRIURETIC PEPTIDE: B Natriuretic Peptide: 148.9 pg/mL — ABNORMAL HIGH (ref 0.0–100.0)

## 2023-11-10 NOTE — Telephone Encounter (Signed)
 Advanced Heart Failure Patient Advocate Encounter  Patient is being started on [facility]. Will send in referral once all signatures are obtained.

## 2023-11-10 NOTE — Patient Instructions (Addendum)
 You will be contacted about your new medication once it has been approved.  Labs done today, your results will be available in MyChart, we will contact you for abnormal readings.  Your physician has requested that you have an echocardiogram. Echocardiography is a painless test that uses sound waves to create images of your heart. It provides your doctor with information about the size and shape of your heart and how well your heart's chambers and valves are working. This procedure takes approximately one hour. There are no restrictions for this procedure. Please do NOT wear cologne, perfume, aftershave, or lotions (deodorant is allowed). Please arrive 15 minutes prior to your appointment time.  Please note: We ask at that you not bring children with you during ultrasound (echo/ vascular) testing. Due to room size and safety concerns, children are not allowed in the ultrasound rooms during exams. Our front office staff cannot provide observation of children in our lobby area while testing is being conducted. An adult accompanying a patient to their appointment will only be allowed in the ultrasound room at the discretion of the ultrasound technician under special circumstances. We apologize for any inconvenience.  Your physician recommends that you schedule a follow-up appointment in: 3 months ( October) ** PLEASE CALL THE OFFICE IN [place] TO ARRANGE YOUR FOLLOW UP APPOINTMENT.**  If you have any questions or concerns before your next appointment please send us  a message through [place] or call our office at [phone].    TO LEAVE A MESSAGE FOR THE NURSE SELECT OPTION 2, PLEASE LEAVE A MESSAGE INCLUDING: YOUR NAME DATE OF BIRTH CALL BACK NUMBER REASON FOR CALL**this is important as we prioritize the call backs  YOU WILL RECEIVE A CALL BACK THE SAME DAY AS LONG AS YOU CALL BEFORE 4:00 PM  At the [facility], you and your health needs are our priority. As part of our  continuing mission to provide you with exceptional heart care, we have created designated Provider Care Teams. These Care Teams include your primary Cardiologist (physician) and Advanced Practice Providers (APPs- Physician Assistants and Nurse Practitioners) who all work together to provide you with the care you need, when you need it.   You may see any of the following providers on your designated Care Team at your next follow up: Dr Toribio Fuel Dr Ezra Shuck Dr. Ria Commander Dr. Morene Brownie Amy Lenetta, NP Caffie Shed, [place] [facility], [place] Beckey Coe, NP [place] Lee, NP Ellouise Class, NP Tinnie Redman, PharmD Jaun Bash, PharmD   Please be sure to bring in all your medications bottles to every appointment.    Thank you for choosing [facility]

## 2023-11-12 NOTE — Progress Notes (Signed)
 PCP: Geofm Glade PARAS, MD HF Cardiology: Dr. Rolan  Chief complaint: Pulmonary hypertension  73 y.o. with history of pulmonary hypertension returns for followup of PH and RV failure.  Patient has a history of rheumatoid arthritis though not currently treated, kyphoscoliosis, prior breast cancer, NSTEMI in 1991 and 1998 thought to be due to vasospasm, and asthma since childhood. She had an echo in 12/23 showing normal LV EF 60-65%, mild RV enlargement with normal RV systolic function, PASP 48 mmHg.  She has joint pain in her hands and has Raynaud's phenomenon.  She is not currently on treatment for RA.  She has had a long history of asthma, sees Dr. Shelah for this. She wears oxygen  at night, rarely during the day.   She had LHC/RHC in 12/24, this showed no significant CAD and normal filling pressures but CI was mildly low at 2.1 and there was severe pulmonary arterial hypertension with PVR 10.4 WU.   Echo in 12/24 showed EF 70-75%, moderate LVH, D-shaped septum, mild RV dysfunction with mildly dilated RV, PASP 73 mmHg, mild-moderate MR, IVC dilated.   V/Q scan in 3/25 showed no evidence for chronic PE.  High resolution CT chest in 3/25 showed no interstitial lung disease but cannot exclude bronchiolitis obliterans, also noted was a stable calcified right adrenal mass.   She is now on Opsumit  and tadalafil  20 mg daily.  She stopped Jardiance  due to lightheadedness. She tried Uptravi  but stopped it due to intractable abdominal pain and diarrhea even with the lowest dose.   Using 2L home oxygen  at night and with exertion.  Using a walker for stability.  She fatigues easily.  Mild dyspnea after walking about 100 feet.  Short of breath with housework like vacuuming and sweeping.  She does ok with dressing, mild dyspnea with bathing.  Weight down 4 lbs. She has seen rheumatology with plans for treatment of her rheumatoid arthritis (has followup).  BP elevated in the office today but SBP generally runs around  100.  No lightheadedness or syncope. No chest pain.    ECG (personally reviewed): NSR with PACs, poor RWP.   Labs (10/24): LDL 56 Labs (11/24): pro-BNP 1389, hgb 13.8, K 4.2, creatinine 0.93 Labs (12/24): K 3.9, creatinine 0.86, anti-SCL 70 negative, anti-centromere negative, RF 396, CCP > 250.  Labs (4/25): hgb 11.8, K 3.8, creatinine 0.68, LFTs normal, LDL 57, BNP 145  6 minute walk (1/25): 152 m  6 minute walk (4/25): 243 m  PMH: 1. Hypothyroidism 2. CAD: NSTEMI thought to be related to vasospasm in 1991 and 1998.  - LHC (12/24): Normal coronaries.  3. HTN: Orthostatic hypotension with amlodipine .  4. Hyperlipidemia 5. Atrial septal aneurysm 6. Asthma: Since childhood, sees Dr. Shelah 7. Breast cancer: S/p lumpectomy, radiation.  8. Rheumatoid arthritis: Not on DMARD.  9. Raynaud's phenomenon.  10. Pulmonary hypertension: Echo (12/23) with EF 60-65%, mild RV enlargement with normal RV systolic function, severe RAE, PASP 48 mmHg.  - RHC (12/24): mean RA 8, PA 78/25 mean 46, mean PCWP 14, CI 2.1, PVR 10.4 WU, PAPi 6.6.  - Echo (12/24): EF 70-75%, moderate LVH, D-shaped septum, mild RV dysfunction with mildly dilated RV, PASP 73 mmHg, mild-moderate MR, IVC dilated.  - V/Q scan (3/25): No chronic PE.  - High resolution CT chest (3/25): No interstitial lung disease but cannot exclude bronchiolitis obliterans, also noted was a stable calcified right adrenal mass.  11. Kyphoscoliosis 12. Right THR  SH: Widow, lives alone in [place], 2 children  locally. Nonsmoker. No ETOH.   Family History  Problem Relation Age of Onset   Heart failure Mother    Pneumonia Mother    Hypertension Mother    Heart disease Mother    Stroke Maternal Grandfather    Hypertension Maternal Grandfather    Heart attack Father    Heart disease Father    Asthma Paternal Uncle        PAT UNCLES   Heart attack Paternal Uncle    ROS: All systems reviewed and negative except as per HPI.   Current  Outpatient Medications  Medication Sig Dispense Refill   albuterol  (VENTOLIN  HFA) 108 (90 Base) MCG/ACT inhaler Inhale 2 puffs into the lungs every 4 (four) hours as needed for wheezing or shortness of breath. 1 each 11   aspirin  EC 81 MG tablet Take 1 tablet (81 mg total) by mouth daily. Swallow whole. 90 tablet 3   atorvastatin  (LIPITOR) 20 MG tablet Take 1 tablet (20 mg total) by mouth daily. 90 tablet 3   clobetasol  cream (TEMOVATE ) 0.05 % Apply 1 Application topically as needed.     DULoxetine  (CYMBALTA ) 30 MG capsule TAKE ONE CAPSULE EVERY DAY IN ADDITION TO 60MG  CAPSULE FOR A TOTAL OF 90MG  DAILY 90 capsule 1   DULoxetine  (CYMBALTA ) 60 MG capsule TAKE ONE CAPSULE BY MOUTH EVERY DAY TAKE ALONG WITH 30MG  CAPSULE. TOTAL DAILY DOSE is 90MG . 90 capsule 1   famotidine  (PEPCID ) 20 MG tablet TAKE ONE TABLET BY MOUTH EVERY DAY AFTER SUPPER 90 tablet 2   fluticasone  furoate-vilanterol (BREO ELLIPTA ) 100-25 MCG/ACT AEPB INHALE 1 PUFF INTO THE LUNGS DAILY 60 each 11   furosemide  (LASIX ) 20 MG tablet Take 2 tablets (40 mg total) by mouth daily. 180 tablet 3   gabapentin  (NEURONTIN ) 100 MG capsule Take 1 capsule (100 mg total) by mouth in the morning. 30 capsule 0   gabapentin  (NEURONTIN ) 600 MG tablet TAKE ONE TABLET BY MOUTH EVERY DAY AT BEDTIME 90 tablet 1   HYDROcodone -acetaminophen  (NORCO) 10-325 MG tablet Take 1 tablet by mouth 3 (three) times daily as needed. Do Not Fill Before 11/12/2023 90 tablet 0   ipratropium-albuterol  (DUONEB) 0.5-2.5 (3) MG/3ML SOLN Take 3 mLs by nebulization every 6 (six) hours as needed (wheezing or SOB). During exacerbation 120 mL 3   levothyroxine  (SYNTHROID ) 100 MCG tablet TAKE 1 TABLET(100 MCG) BY MOUTH DAILY 90 tablet 2   macitentan  (OPSUMIT ) 10 MG tablet Take 1 tablet (10 mg total) by mouth daily.     tadalafil , PAH, (ADCIRCA ) 20 MG tablet Take 2 tablets (40 mg total) by mouth daily. 60 tablet 11   bisoprolol  (ZEBETA ) 5 MG tablet TAKE 1 TABLET BY MOUTH DAILY 90 tablet  1   Selexipag  (UPTRAVI  TITRATION) 200 & 800 MCG TBPK Take 200 mcg by mouth twice daily. Increase as tolerated to target dose of 1600 mcg BID. (Patient not taking: Reported on 11/10/2023)     No current facility-administered medications for this encounter.   BP (!) 148/82   Pulse 82   Ht 5' (1.524 m)   Wt 58 kg (127 lb 12.8 oz)   SpO2 93%   BMI 24.96 kg/m  General: Frail, kyphotic Neck: No JVD, no thyromegaly or thyroid  nodule.  Lungs: Clear to auscultation bilaterally with normal respiratory effort. CV: Nondisplaced PMI.  Heart regular S1/S2, no S3/S4, no murmur.  No peripheral edema.  No carotid bruit.  Normal pedal pulses.  Abdomen: Soft, nontender, no hepatosplenomegaly, no distention.  Skin: Intact without lesions  or rashes.  Neurologic: Alert and oriented x 3.  Psych: Normal affect. Extremities: No clubbing or cyanosis.  HEENT: Normal.   Assessment/Plan: 1. Pulmonary hypertension/RV failure: Echo in 12/24 with  EF 70-75%, moderate LVH, D-shaped septum, mild RV dysfunction with mildly dilated RV, PASP 73 mmHg, mild-moderate MR, IVC dilated. RHC in 12/24 with normal filling pressures, CI 2.1, and severe PAH with mean PA pressure 46 and PVR 10.4 WU.  V/Q scan in 3/25 with no evidence for chronic PE, high resolution CT chest in 3/25 with no evidence for interstitial lung disease.  I suspect group 1 PH related to rheumatologic disease => she has rheumatoid arthritis.  NYHA class III symptoms chronically.  She is not volume overloaded on exam today.  She did not tolerate even lowest dose of Uptravi .  - Continue Opsumit  10 mg daily.  - Continue tadalafil  40 mg daily.   - I will work on getting her started on sotatercept.  - 6 minute walk next appointment when she has started sotatercept. - Did not tolerate even the lowest dose of Uptravi  due to diarrhea and abdominal pain, will try her on Tyvaso DPI in the future.  - Continue Lasix  40 mg daily, BMET/BNP today - She did not tolerate  Jardiance .  - Echo in 12/25.  2. Rheumatoid arthritis:  She is not on disease-specific meds currently.  She also has Raynaud's phenomenon.  RF and CCP elevated. - Has followup with rheumatology to discuss treatment.  3. CAD: Patient had NSTEMIs in 1991 and 1998, per her report thought to be due to vasospasm.  LHC in 12/24 showed no significant coronary disease.  - Continue ASA 81  - Continue statin, good lipids in 4/25.  4. Hyperlipidemia: She is on atorvastatin , good lipids in 10/24.  5. Kyphoscoliosis: This likely contributed to her dyspnea via chest wall restriction.  6. HTN: BP controlled.  7. Abnormal CT chest: Cannot rule out bronchiolitis obliterans per report though no ILD.  Also with calcified right adrenal mass.  - Needs dedicated adrenal CT (has been ordered).  - Has appointment with Dr. Shelah with pulmonary.   Followup 3 months.   I spent 32 minutes reviewing data, interviewing patient, and organizing the orders/followup.   Ezra Shuck 11/12/2023

## 2023-11-13 ENCOUNTER — Other Ambulatory Visit (HOSPITAL_COMMUNITY): Payer: Self-pay

## 2023-11-13 ENCOUNTER — Telehealth (HOSPITAL_COMMUNITY): Payer: Self-pay | Admitting: Pharmacy Technician

## 2023-11-13 NOTE — Telephone Encounter (Signed)
 Patient Advocate Encounter   Received notification from [facility] Medicare that prior authorization for Winrev[employer] is required.   PA submitted on CoverMyMeds Key [id number] Status is pending   Will continue to follow.

## 2023-11-14 ENCOUNTER — Other Ambulatory Visit (HOSPITAL_COMMUNITY): Payer: Self-pay

## 2023-11-14 NOTE — Telephone Encounter (Signed)
 [facility] Patient Advocate Architect referral along with PA approval sent to [employer] via efax. Document scanned to chart.   Will follow up.

## 2023-11-14 NOTE — Telephone Encounter (Signed)
 Advanced [facility] Patient Advocate Encounter  [employer] confirmed receipt of referral.   Will follow up.

## 2023-11-14 NOTE — Telephone Encounter (Signed)
 Advanced [facility] Failure Patient Advocate Encounter  Prior Authorization for [employer] has been approved.    PA# [id number] Effective dates: 11/13/23 through 07/21/2  Patients co-pay is $0  Almarie JULIANNA Pa, CPhT

## 2023-11-15 ENCOUNTER — Other Ambulatory Visit: Payer: Self-pay | Admitting: Internal Medicine

## 2023-11-21 DIAGNOSIS — J4540 Moderate persistent asthma, uncomplicated: Secondary | ICD-10-CM | POA: Diagnosis not present

## 2023-11-21 DIAGNOSIS — R0609 Other forms of dyspnea: Secondary | ICD-10-CM | POA: Diagnosis not present

## 2023-11-21 NOTE — Telephone Encounter (Signed)
 Advanced [facility] Patient Advocate Encounter  Contacted [facility] regarding [facility]revair referral. They are scheduled to make a nursing call today. Should be able to schedule fill after welcome call.

## 2023-11-27 ENCOUNTER — Ambulatory Visit: Attending: Internal Medicine | Admitting: Internal Medicine

## 2023-11-27 ENCOUNTER — Encounter: Payer: Self-pay | Admitting: Internal Medicine

## 2023-11-27 VITALS — BP 113/60 | HR 73 | Resp 14 | Ht 60.0 in | Wt 126.0 lb

## 2023-11-27 DIAGNOSIS — R768 Other specified abnormal immunological findings in serum: Secondary | ICD-10-CM | POA: Diagnosis not present

## 2023-11-27 DIAGNOSIS — M159 Polyosteoarthritis, unspecified: Secondary | ICD-10-CM

## 2023-11-27 DIAGNOSIS — R200 Anesthesia of skin: Secondary | ICD-10-CM | POA: Diagnosis not present

## 2023-11-27 NOTE — Progress Notes (Signed)
 Office Visit Note  Patient: Hannah Scott             Date of Birth: 1950/06/02           MRN: [id number]             PCP: Geofm Glade JINNY, MD Referring: Geofm Glade JINNY, MD Visit Date: 11/27/2023   Subjective:  Follow-up   Discussed the use of AI scribe software for clinical note transcription with the patient, who gave verbal consent to proceed.  History of Present Illness   Hannah Scott is a 73 year old female with osteoarthritis who presents with joint pain and numbness in her hands.  She has a history of joint pain and stiffness, primarily affecting her hands, shoulders, and feet. She also experiences pain in her shoulder, particularly at night, and difficulty raising her shoulders.  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 These episodes occurred years ago and have not been bothersome recently. She has been suggested for rheumatoid arthritis based on antibody markers, but recent lab tests showed normal inflammation levels, and x-rays revealed degenerative changes without any particular damage indicative for RA.  She experiences numbness and tingling in her hands, particularly affecting the thumb and first three fingers, which has progressively worsened. The sensation is described as 'tingly' and similar to the feeling when 'there's feeling trying to come back into it after you've been to the dentist.' No numbness in her feet is reported. She has not had a nerve study done previously and reports difficulty gripping objects due to the numbness.     Previous HPI 08/21/23 Hannah Scott is a 73 year old female here for evaluation and management of her arthritis with an unclear RA history and highly positive RF and CCP Abs.   She has a long-standing history of rheumatoid arthritis, diagnosed in her late twenties or thirties, initially presenting with transient joint  swelling and redness. These episodes were confirmed by blood tests at the time. Over the years, her symptoms subsided significantly, and she has not been on specific rheumatoid arthritis medications for a long time. Currently, she is experiencing significant issues with her feet, particularly her toes, which have become deformed over the past two to three months. She describes her toes as 'going over' and one toe hitting the floor when she walks, causing discomfort. This is the worst symptom she is experiencing at the moment.   She reports numbness in the first three fingers of her right hand, describing it as feeling 'like when you've been to the dentist.' This numbness is persistent and affects her ability to hold objects. No similar symptoms in her left hand, although her little finger tingles occasionally.   Her shoulder pain has been present for the past two to three months, with occasional aching and a 'squishy' sound. She has not had any procedures or injections for her joints, except for her back, which has received shots in the past.   She is currently taking hydrocodone  10 mg up to three times a day for back pain, gabapentin  600 mg at night, and duloxetine  90 mg daily (30 mg and 60 mg tablets). She has used prednisone  in the past for asthma, which also alleviated her joint symptoms. No numbness in her feet but notes some swelling, particularly in her right foot. She has not experienced recent oral steroid use for  her arthritis.       Labs reviewed 03/2023 RF 395.5 CCP >250 Centromere, Scl-70 neg   08/2021 HAV/HBV/HCV neg   Imaging reviewed 07/17/23 HRCT Chest IMPRESSION: 1. No evidence of fibrotic interstitial lung disease. 2. Mosaic attenuation with air trapping, indicative of small airways disease. Difficult to exclude a component of bronchiolitis obliterans. 3. Cholelithiasis. 4. Coarsely calcified right adrenal mass, stable. This could be further evaluated with CT abdomen  without and contrast, as clinically indicated. 5. Aortic atherosclerosis (ICD10-I70.0). Coronary artery calcification. 6. Enlarged pulmonic trunk, indicative of pulmonary arterial hypertension.   03/28/23 R/L Heart Cath 1. Minimal coronary disease.  2. Normal filling pressures (mean PCWP 14) 3. Severe pulmonary arterial hypertension with PVR 10.4 WU.    Review of Systems  Constitutional:  Positive for fatigue.  HENT:  Positive for mouth dryness. Negative for mouth sores.   Eyes:  Negative for dryness.  Respiratory:  Positive for shortness of breath.   Cardiovascular:  Negative for chest pain and palpitations.  Gastrointestinal:  Negative for blood in stool, constipation and diarrhea.  Endocrine: Positive for increased urination.  Genitourinary:  Negative for involuntary urination.  Musculoskeletal:  Positive for joint pain, gait problem, joint pain, joint swelling, myalgias, muscle weakness, morning stiffness, muscle tenderness and myalgias.  Skin:  Positive for sensitivity to sunlight. Negative for color change, rash and hair loss.  Allergic/Immunologic: Negative for susceptible to infections.  Neurological:  Positive for dizziness. Negative for headaches.  Hematological:  Negative for swollen glands.  Psychiatric/Behavioral:  Positive for depressed mood and sleep disturbance. The patient is nervous/anxious.     PMFS History:  Patient Active Problem List   Diagnosis Date Noted   Bilateral hand numbness 11/27/2023   Rheumatoid factor positive 11/27/2023   Generalized osteoarthritis of multiple sites 11/27/2023   Vasospasm (HCC) 03/13/2023   Chronic respiratory failure (HCC) 03/13/2023   Leg cramping 12/16/2022   B12 deficiency 01/22/2022   Zinc  deficiency 01/19/2022   Chronic diastolic heart failure (HCC) 01/19/2022   Severe pulmonary hypertension (HCC) 01/19/2022   Abnormal CT of the chest 12/01/2021   Anemia due to zinc  deficiency 11/02/2021   LVH (left ventricular  hypertrophy) 10/29/2021   Deficiency anemia 10/29/2021   Iron deficiency anemia secondary to inadequate dietary iron intake 10/29/2021   Stage 3b chronic kidney disease (HCC) 10/29/2021   Acute cystitis with hematuria    NSTEMI (non-ST elevated myocardial infarction) (HCC)    Esophageal dysphagia    Benign neoplasm of ascending colon    Benign neoplasm of descending colon    Kyphoscoliosis 03/30/2021   GERD (gastroesophageal reflux disease) 01/11/2021   Vitamin D  deficiency 01/11/2021   Insomnia 09/11/2019   Murmur, cardiac 01/16/2018   Moderate persistent asthma 12/13/2017   Osteoporosis 01/07/2017   Diabetes mellitus without complication (HCC) 07/07/2016   History of breast cancer 01/06/2016   Intraductal papilloma of left breast 01/06/2016   Essential hypertension 06/08/2015   DOE (dyspnea on exertion) 06/08/2015   Fatigue 05/20/2015   Back pain 04/22/2015   Multinodular goiter 12/01/2011   Hypothyroidism    CAD (coronary artery disease)    Atrial septal aneurysm    Hyperlipidemia    Spinal stenosis, lumbar region, with neurogenic claudication 07/01/2011   Lumbar postlaminectomy syndrome 07/01/2011   Osteoarthritis of right hip 07/01/2011   Contracture of right hip 07/01/2011   Endometriosis    Depression 12/24/2009   Migraine headache 05/19/2006    Past Medical History:  Diagnosis Date   Anemia  hx   Arthritis    Asthma    PFTs, February, 2011, moderate obstructive disease with response to bronchodilators, normal lung volumes, moderate reduction in diffusing capacity   Atrial septal aneurysm    Echo, 2008-not noted on 13 echo   CAD (coronary artery disease)    90% distal LAD in the past  /   nuclear, 2008, no ischemia, ejection fraction 70%   Cancer (HCC) 2002   DUCTAL CIS--S/P LUMPECTOMY, RADIATION AND 6 WEEKS OF TAMOXIFEN   D-dimer, elevated    January, 2014   Depression    Ejection fraction    EF 60%, echo, October, 2008   Elevated CPK    January, 2014    Endometriosis 1989   RIGHT TUBE   Endometriosis 1987   LEFT TUBE/OVARY W FOCAL IN-SITU ENDOMETRIAL ADENOCARCINOMA   GERD (gastroesophageal reflux disease)    occ   H/O hiatal hernia    ?   High cholesterol    Hyperlipidemia    Hypertension    Hypothyroidism    Patient has had in the past that she does not need treatment   Kyphoscoliosis    Obstructive airway disease (HCC)    Pinched nerve    lower back   Shortness of breath    Echo 2/22: EF 60-65, no RWMA, mild LVH, GR 1  DD, GLS -24%, normal RVSF, mild MR, trivial AI, borderline dilation of aortic root (39 mm)   UTI (lower urinary tract infection)     Family History  Problem Relation Age of Onset   Heart failure Mother    Pneumonia Mother    Hypertension Mother    Heart disease Mother    Stroke Maternal Grandfather    Hypertension Maternal Grandfather    Heart attack Father    Heart disease Father    Asthma Paternal Uncle        PAT UNCLES   Heart attack Paternal Uncle    Past Surgical History:  Procedure Laterality Date   ANKLE SURGERY Left    ligament   APPENDECTOMY  1987   AT [facility]   BACK SURGERY     Fusion   BREAST LUMPECTOMY  2003   radiation on right   BREAST LUMPECTOMY WITH RADIOACTIVE SEED LOCALIZATION Left 02/12/2016   Procedure: LEFT BREAST LUMPECTOMY WITH RADIOACTIVE SEED LOCALIZATION;  Surgeon: Morene Olives, MD;  Location:  [facility];  Service: General;  Laterality: Left;   CARDIOVASCULAR STRESS TEST  09/07/05   Nuclear, was negative   CATARACT EXTRACTION Bilateral 01,03   COLONOSCOPY WITH PROPOFOL  N/A 05/04/2021   Procedure: COLONOSCOPY WITH PROPOFOL ;  Surgeon: Avram Lupita BRAVO, MD;  Location: [facility];  Service: Endoscopy;  Laterality: N/A;   ESOPHAGOGASTRODUODENOSCOPY (EGD) WITH PROPOFOL  N/A 05/04/2021   Procedure: ESOPHAGOGASTRODUODENOSCOPY (EGD) WITH PROPOFOL ;  Surgeon: Avram Lupita BRAVO, MD;  Location: [facility];  Service: Endoscopy;  Laterality: N/A;   OOPHORECTOMY      [facility] -RSO   PELVIC LAPAROSCOPY  1989   RSO, LYSIS OF ADHESIONS   POLYPECTOMY  05/04/2021   Procedure: POLYPECTOMY;  Surgeon: Avram Lupita BRAVO, MD;  Location: [facility];  Service: Endoscopy;;   RIGHT/LEFT HEART CATH AND CORONARY ANGIOGRAPHY N/A 03/28/2023   Procedure: RIGHT/LEFT HEART CATH AND CORONARY ANGIOGRAPHY;  Surgeon: Rolan Ezra RAMAN, MD;  Location: [facility];  Service: Cardiovascular;  Laterality: N/A;   TOTAL ABDOMINAL HYSTERECTOMY  1987   [facility], APPENDECTOMY   TOTAL HIP ARTHROPLASTY Right 10/28/2013   dr barbarann   TOTAL  HIP ARTHROPLASTY Right 10/28/2013   Procedure: TOTAL HIP ARTHROPLASTY ANTERIOR APPROACH;  Surgeon: Oneil JAYSON Herald, MD;  Location: [facility] OR;  Service: Orthopedics;  Laterality: Right;  Right Total Hip Arthroplasty, Direct Anterior Approach   Social History   Social History Narrative   Widowed in 2015   2 sons 1 lives in the area the other is in close contact /2025   1 grandchild   Never smoker no drug use no alcohol    Immunization History  Administered Date(s) Administered   Fluad Quad(high Dose 65+) 01/04/2019, 01/20/2022   Fluad Trivalent(High Dose 65+) 02/07/2023   Influenza Inj Mdck Quad Pf 04/09/2021   Influenza Split 03/23/2011, 02/15/2012   Influenza Whole 02/12/2003, 12/24/2009   Influenza, High Dose Seasonal PF 01/06/2016, 01/10/2017, 03/16/2018   Influenza,inj,Quad PF,6+ Mos 01/29/2013, 01/03/2014, 02/11/2015   Influenza-Unspecified 04/09/2021   Moderna Covid-19 Vaccine Bivalent Booster 39yrs & up 03/26/2021   Moderna SARS-COV2 Booster Vaccination 04/10/2020   Moderna Sars-Covid-2 Vaccination 06/21/2019, 07/24/2019   Pneumococcal Conjugate-13 05/06/2015   Pneumococcal Polysaccharide-23 07/06/2016   Tdap 01/09/2019     Objective: Vital Signs: BP 113/60 (BP Location: Right Arm, Patient Position: Sitting, Cuff Size: Normal)   Pulse 73   Resp 14   Ht 5' (1.524 m)   Wt 126 lb (57.2 kg)   BMI 24.61 kg/m    Physical Exam Eyes:      Conjunctiva/sclera: Conjunctivae normal.  Cardiovascular:     Rate and Rhythm: Normal rate and regular rhythm.  Pulmonary:     Effort: Pulmonary effort is normal.     Breath sounds: Normal breath sounds.  Skin:    General: Skin is warm and dry.  Neurological:     Mental Status: She is alert.  Psychiatric:        Mood and Affect: Mood normal.      Musculoskeletal Exam:  Right shoulder tenderness on anterior side of joint, full range of motion, some pain with full overhead abduction Elbows full ROM no tenderness or swelling Wrists full ROM, right wrist pain with percussion radiating into hand, no palpable swelling Distal Heberden's nodes throughout both hands, first CMC squaring on both hands, left thumb with nonreducible MCP subluxation Severe kyphoscoliosis, lateral rotations in thoracic spine Knees full ROM, anterior joint line tenderness  Investigation: No additional findings.  Imaging: No results found.  Recent Labs: Lab Results  Component Value Date   WBC 3.7 (L) 11/10/2023   HGB 11.6 (L) 11/10/2023   PLT 156 11/10/2023   NA 138 11/10/2023   K 4.3 11/10/2023   CL 100 11/10/2023   CO2 31 11/10/2023   GLUCOSE 97 11/10/2023   BUN 19 11/10/2023   CREATININE 0.88 11/10/2023   BILITOT 0.7 11/10/2023   ALKPHOS 71 11/10/2023   AST 22 11/10/2023   ALT 14 11/10/2023   PROT 5.9 (L) 11/10/2023   ALBUMIN  3.6 11/10/2023   CALCIUM  9.3 11/10/2023   GFRAA 79 06/05/2020    Speciality Comments: No specialty comments available.  Procedures:  No procedures performed Allergies: Fluticasone -salmeterol, Norvasc  [amlodipine  besylate], Tape, Heparin , and Morphine    Assessment / Plan:     Visit Diagnoses: Bilateral hand numbness Suspected right carpal tunnel syndrome Chronic numbness and tingling in right hand, suggestive of carpal tunnel syndrome, likely due to arthritis-related impingement. - Order nerve conduction study. - Provide hand and wrist exercises. - Consider night  wrist brace. - Consider injection therapy referral if confirmed.  Generalized osteoarthritis of multiple sites Osteoarthritis of multiple joints including advanced thumb  and finger involvement Advanced osteoarthritis with significant degenerative changes in thumb and finger joints, causing functional impairment. - Provide hand and wrist exercises. - Consider occupational therapy referral if nerve conduction study indicates nerve involvement.  Rheumatoid factor positive Palindromic rheumatism (quiescent rheumatoid arthritis) Palindromic rheumatism with quiescent rheumatoid arthritis, no active inflammation.       Orders: No orders of the defined types were placed in this encounter.  No orders of the defined types were placed in this encounter.    Follow-Up Instructions: No follow-ups on file.   Lonni LELON Ester, MD  Note - This record has been created using [employer].  Chart creation errors have been sought, but may not always  have been located. Such creation errors do not reflect on  the standard of medical care.

## 2023-11-29 ENCOUNTER — Encounter (HOSPITAL_BASED_OUTPATIENT_CLINIC_OR_DEPARTMENT_OTHER): Admitting: Internal Medicine

## 2023-11-30 NOTE — Telephone Encounter (Addendum)
 Advanced Heart Failure Patient Advocate Encounter  Per [facility], patient has been unreachable to set up [facility]. [facility] will reach out to her again. She needs to call, [phone] for RN, Karna Nett. Called and left the patient a message.

## 2023-12-04 DIAGNOSIS — H34832 Tributary (branch) retinal vein occlusion, left eye, with macular edema: Secondary | ICD-10-CM | POA: Diagnosis not present

## 2023-12-06 ENCOUNTER — Other Ambulatory Visit: Payer: Self-pay | Admitting: *Deleted

## 2023-12-06 DIAGNOSIS — R200 Anesthesia of skin: Secondary | ICD-10-CM

## 2023-12-06 DIAGNOSIS — R202 Paresthesia of skin: Secondary | ICD-10-CM

## 2023-12-06 NOTE — Progress Notes (Signed)
 Referral placed, LMOM for patient to call office to schedule f/u appt 1 week after appt with Dr. Eldonna.

## 2023-12-07 ENCOUNTER — Encounter: Attending: Registered Nurse | Admitting: Registered Nurse

## 2023-12-07 ENCOUNTER — Encounter: Payer: Self-pay | Admitting: Registered Nurse

## 2023-12-07 VITALS — BP 108/54 | HR 69 | Ht 60.0 in | Wt 124.0 lb

## 2023-12-07 DIAGNOSIS — M25511 Pain in right shoulder: Secondary | ICD-10-CM | POA: Diagnosis not present

## 2023-12-07 DIAGNOSIS — G8929 Other chronic pain: Secondary | ICD-10-CM | POA: Insufficient documentation

## 2023-12-07 DIAGNOSIS — G894 Chronic pain syndrome: Secondary | ICD-10-CM | POA: Insufficient documentation

## 2023-12-07 DIAGNOSIS — M48062 Spinal stenosis, lumbar region with neurogenic claudication: Secondary | ICD-10-CM | POA: Insufficient documentation

## 2023-12-07 DIAGNOSIS — M25551 Pain in right hip: Secondary | ICD-10-CM | POA: Insufficient documentation

## 2023-12-07 DIAGNOSIS — M24551 Contracture, right hip: Secondary | ICD-10-CM | POA: Diagnosis not present

## 2023-12-07 DIAGNOSIS — Z5181 Encounter for therapeutic drug level monitoring: Secondary | ICD-10-CM | POA: Insufficient documentation

## 2023-12-07 DIAGNOSIS — M792 Neuralgia and neuritis, unspecified: Secondary | ICD-10-CM | POA: Insufficient documentation

## 2023-12-07 DIAGNOSIS — M25552 Pain in left hip: Secondary | ICD-10-CM | POA: Insufficient documentation

## 2023-12-07 MED ORDER — HYDROCODONE-ACETAMINOPHEN 10-325 MG PO TABS: 1.0000 | ORAL_TABLET | Freq: Three times a day (TID) | ORAL | 0 refills | Status: DC | PRN

## 2023-12-07 NOTE — Progress Notes (Signed)
 Subjective:    Patient ID: Hannah Scott, female    DOB: 09-09-1950, 73 y.o.   MRN: [id number]  HPI: Hannah Scott is a 73 y.o. female whose appointment was changed to telephone visit, her car remains in the shop, she states hopefully she will have her car fixed by tomorrow. She is scheduled  for follow up appointment for chronic pain and medication refill.She  states her pain is located in her right shoulder, lower back  and bilateral hips. She also reports tingling in her right hand. She rates her pain 7. Her current exercise regime is walking with her walker for short distances and performing stretching exercises.  I connected with Hannah Scott by telephone and verified that I am speaking with the correct person using two identifiers.  Location: Patient: In her Home  Provider: In the Office    I discussed the limitations, risks, security and privacy concerns of performing an evaluation and management service by telephone and the availability of in person appointments. I also discussed with the patient that there may be a patient responsible charge related to this service. The patient expressed understanding and agreed to proceed.  Hannah Scott  equivalent is 30.00 MME.   Last oral swab was performed on 07/20/2023, it was consistent.     Pain Inventory Average Pain 7 Pain Right Now 7 My pain is constant, burning, tingling, and aching  In the last 24 hours, has pain interfered with the following? General activity 10 Relation with others 10 Enjoyment of life 10 What TIME of day is your pain at its worst? morning , daytime, evening, and night Sleep (in general) Poor  Pain is worse with: walking, standing, and some activites Pain improves with: rest and medication Relief from Meds: 5  Family History  Problem Relation Age of Onset   Heart failure Mother    Pneumonia Mother    Hypertension Mother    Heart disease Mother    Stroke Maternal Grandfather    Hypertension Maternal  Grandfather    Heart attack Father    Heart disease Father    Asthma Paternal Uncle        PAT UNCLES   Heart attack Paternal Uncle    Social History   Socioeconomic History   Marital status: Widowed    Spouse name: Not on file   Number of children: Not on file   Years of education: Not on file   Highest education level: Not on file  Occupational History   Occupation: RETIRED  Tobacco Use   Smoking status: Never    Passive exposure: Never   Smokeless tobacco: Never  Vaping Use   Vaping status: Never Used  Substance and Sexual Activity   Alcohol  use: No    Alcohol /week: 0.0 standard drinks of alcohol    Drug use: No   Sexual activity: Not Currently    Partners: Male    Birth control/protection: Surgical  Other Topics Concern   Not on file  Social History Narrative   Widowed in 2015   2 sons 1 lives in the area the other is in close contact /2025   1 grandchild   Never smoker no drug use no alcohol    Social Drivers of Corporate investment banker Strain: Medium Risk (08/01/2023)   Overall Financial Resource Strain (CARDIA)    Difficulty of Paying Living Expenses: Somewhat hard  Food Insecurity: No Food Insecurity (08/01/2023)   Hunger Vital Sign    Worried About Running  Out of Food in the Last Year: Never true    Ran Out of Food in the Last Year: Never true  Transportation Needs: No Transportation Needs (08/01/2023)   PRAPARE - Administrator, Civil Service (Medical): No    Lack of Transportation (Non-Medical): No  Physical Activity: Inactive (08/01/2023)   Exercise Vital Sign    Days of Exercise per Week: 0 days    Minutes of Exercise per Session: 0 min  Stress: Stress Concern Present (08/01/2023)   [employer] of Occupational Health - Occupational Stress Questionnaire    Feeling of Stress : To some extent  Social Connections: Socially Isolated (08/01/2023)   Social Connection and Isolation Panel    Frequency of Communication with Friends and Family: More  than three times a week    Frequency of Social Gatherings with Friends and Family: Once a week    Attends Religious Services: Never    Database administrator or Organizations: No    Attends Banker Meetings: Never    Marital Status: Widowed   Past Surgical History:  Procedure Laterality Date   ANKLE SURGERY Left    ligament   APPENDECTOMY  1987   AT [facility]   BACK SURGERY     Fusion   BREAST LUMPECTOMY  2003   radiation on right   BREAST LUMPECTOMY WITH RADIOACTIVE SEED LOCALIZATION Left 02/12/2016   Procedure: LEFT BREAST LUMPECTOMY WITH RADIOACTIVE SEED LOCALIZATION;  Surgeon: Morene Olives, MD;  Location: [facility];  Service: General;  Laterality: Left;   CARDIOVASCULAR STRESS TEST  09/07/05   Nuclear, was negative   CATARACT EXTRACTION Bilateral 01,03   COLONOSCOPY WITH PROPOFOL  N/A 05/04/2021   Procedure: COLONOSCOPY WITH PROPOFOL ;  Surgeon: Avram Lupita BRAVO, MD;  Location: [facility];  Service: Endoscopy;  Laterality: N/A;   ESOPHAGOGASTRODUODENOSCOPY (EGD) WITH PROPOFOL  N/A 05/04/2021   Procedure: ESOPHAGOGASTRODUODENOSCOPY (EGD) WITH PROPOFOL ;  Surgeon: Avram Lupita BRAVO, MD;  Location: [facility];  Service: Endoscopy;  Laterality: N/A;   OOPHORECTOMY     LSO -RSO   PELVIC LAPAROSCOPY  1989   RSO, LYSIS OF ADHESIONS   POLYPECTOMY  05/04/2021   Procedure: POLYPECTOMY;  Surgeon: Avram Lupita BRAVO, MD;  Location: [facility];  Service: Endoscopy;;   RIGHT/LEFT HEART CATH AND CORONARY ANGIOGRAPHY N/A 03/28/2023   Procedure: RIGHT/LEFT HEART CATH AND CORONARY ANGIOGRAPHY;  Surgeon: Rolan Ezra RAMAN, MD;  Location: [facility];  Service: Cardiovascular;  Laterality: N/A;   TOTAL ABDOMINAL HYSTERECTOMY  1987   LSO, APPENDECTOMY   TOTAL HIP ARTHROPLASTY Right 10/28/2013   dr barbarann   TOTAL HIP ARTHROPLASTY Right 10/28/2013   Procedure: TOTAL HIP ARTHROPLASTY ANTERIOR APPROACH;  Surgeon: Oneil JAYSON barbarann, MD;  Location: [facility];  Service: Orthopedics;   Laterality: Right;  Right Total Hip Arthroplasty, Direct Anterior Approach   Past Surgical History:  Procedure Laterality Date   ANKLE SURGERY Left    ligament   APPENDECTOMY  1987   AT [facility]   BACK SURGERY     Fusion   BREAST LUMPECTOMY  2003   radiation on right   BREAST LUMPECTOMY WITH RADIOACTIVE SEED LOCALIZATION Left 02/12/2016   Procedure: LEFT BREAST LUMPECTOMY WITH RADIOACTIVE SEED LOCALIZATION;  Surgeon: Morene Olives, MD;  Location: [facility];  Service: General;  Laterality: Left;   CARDIOVASCULAR STRESS TEST  09/07/05   Nuclear, was negative   CATARACT EXTRACTION Bilateral 01,03   COLONOSCOPY WITH PROPOFOL  N/A 05/04/2021   Procedure: COLONOSCOPY  WITH PROPOFOL ;  Surgeon: Avram Lupita BRAVO, MD;  Location: [facility];  Service: Endoscopy;  Laterality: N/A;   ESOPHAGOGASTRODUODENOSCOPY (EGD) WITH PROPOFOL  N/A 05/04/2021   Procedure: ESOPHAGOGASTRODUODENOSCOPY (EGD) WITH PROPOFOL ;  Surgeon: Avram Lupita BRAVO, MD;  Location: [facility];  Service: Endoscopy;  Laterality: N/A;   OOPHORECTOMY     LSO -RSO   PELVIC LAPAROSCOPY  1989   RSO, LYSIS OF ADHESIONS   POLYPECTOMY  05/04/2021   Procedure: POLYPECTOMY;  Surgeon: Avram Lupita BRAVO, MD;  Location: [facility];  Service: Endoscopy;;   RIGHT/LEFT HEART CATH AND CORONARY ANGIOGRAPHY N/A 03/28/2023   Procedure: RIGHT/LEFT HEART CATH AND CORONARY ANGIOGRAPHY;  Surgeon: Rolan Ezra RAMAN, MD;  [facility];  Service: Cardiovascular;  Laterality: N/A;   TOTAL ABDOMINAL HYSTERECTOMY  1987   LSO, APPENDECTOMY   TOTAL HIP ARTHROPLASTY Right 10/28/2013   dr barbarann   TOTAL HIP ARTHROPLASTY Right 10/28/2013   Procedure: TOTAL HIP ARTHROPLASTY ANTERIOR APPROACH;  Surgeon: Oneil JAYSON barbarann, MD;  Location: [facility];  Service: Orthopedics;  Laterality: Right;  Right Total Hip Arthroplasty, Direct Anterior Approach   Past Medical History:  Diagnosis Date   Anemia    hx   Arthritis    Asthma    PFTs, February, 2011,  moderate obstructive disease with response to bronchodilators, normal lung volumes, moderate reduction in diffusing capacity   Atrial septal aneurysm    Echo, 2008-not noted on 13 echo   CAD (coronary artery disease)    90% distal LAD in the past  /   nuclear, 2008, no ischemia, ejection fraction 70%   Cancer (HCC) 2002   DUCTAL CIS--S/P LUMPECTOMY, RADIATION AND 6 WEEKS OF TAMOXIFEN   D-dimer, elevated    January, 2014   Depression    Ejection fraction    EF 60%, echo, October, 2008   Elevated CPK    January, 2014   Endometriosis 1989   RIGHT TUBE   Endometriosis 1987   LEFT TUBE/OVARY W FOCAL IN-SITU ENDOMETRIAL ADENOCARCINOMA   GERD (gastroesophageal reflux disease)    occ   H/O hiatal hernia    ?   High cholesterol    Hyperlipidemia    Hypertension    Hypothyroidism    Patient has had in the past that she does not need treatment   Kyphoscoliosis    Obstructive airway disease (HCC)    Pinched nerve    lower back   Shortness of breath    Echo 2/22: EF 60-65, no RWMA, mild LVH, GR 1  DD, GLS -24%, normal RVSF, mild MR, trivial AI, borderline dilation of aortic root (39 mm)   UTI (lower urinary tract infection)    BP (!) 108/54 (Patient Position: Sitting, Cuff Size: Normal)   Pulse 69   Ht 5' (1.524 m)   Wt 124 lb (56.2 kg)   SpO2 91%   BMI 24.22 kg/m   Opioid Risk Score:   Fall Risk Score:  `1  Depression screen PHQ 2/9     10/02/2023    2:05 PM 08/24/2023    2:14 PM 08/01/2023    3:51 PM 06/20/2023    1:37 PM 04/14/2023   10:53 AM 03/16/2023    2:16 PM 01/03/2023    2:31 PM  Depression screen PHQ 2/9  Decreased Interest 0 1 2 0 0 0 1  Down, Depressed, Hopeless 0 1 3 0 0 0 1  PHQ - 2 Score 0 2 5 0 0 0 2  Altered sleeping  1      Tired, decreased energy   1      Change in appetite   0      Feeling bad or failure about yourself    3      Trouble concentrating   1      Moving slowly or fidgety/restless   0      Suicidal thoughts   0      PHQ-9 Score   11       Difficult doing work/chores   Somewhat difficult         Review of Systems  Musculoskeletal:  Positive for arthralgias, joint swelling and myalgias.       Chronic left knee pain, Right shoulder pain, right hip osteoarthritis, bilateral hip pain  All other systems reviewed and are negative.      Objective:   Physical Exam Vitals and nursing note reviewed.  Musculoskeletal:     Comments: No Physical Exam Performed: Telephone Visit          Assessment & Plan:  1. Lumbar post laminectomy syndrome with severe kyphoscoliosis thoracolumbar spine, s/p lumbar fusion. 12/07/2023 Continue current medication regimen. Refilled:  Hydrocodone  10/325mg  one tablet every 8 hours #90. We will continue the opioid monitoring program, this consists of regular clinic visits, examinations, urine drug screen, pill counts as well as use of [place]  Controlled Substance Reporting system. A 12 month History has been reviewed on the [place]  Controlled Substance Reporting System on 12/07/2023 2. Right Hip OA: S/P Right Hip Replacement 10/28/2013. 08/14/ 2025 3.Depression: Continue Cymbalta . Has Family Support and Friends.  Counseling with Juliene. 12/07/2023 4. Bilateral Hip Pain/ Right Greater Trochanteric Tenderness: No complaints today. Continue to Monitor. Continue with Ice and Heat Therapy. 12/07/2023 5. Poor Appetite/ Frail: No complaints today. Ms. Oliveri states PCP following. Continue to monitor. 12/07/2023 6. Chronic Left  Knee Pain: Continue HEP as Tolerated. Continue to Monitor. 12/07/2023 7. Chronic Right  shoulder pain:  Continue HEP as tolerated. Continue to Monitor. 12/07/2023  8. Neuropathic Pain: Continue Gabapentin . Continue to Monitor.   F/U in 1 month

## 2023-12-12 ENCOUNTER — Telehealth: Payer: Self-pay | Admitting: Internal Medicine

## 2023-12-12 NOTE — Telephone Encounter (Signed)
 Attempted to contact patient and left message to advise patient to call the office and schedule appointment.

## 2023-12-12 NOTE — Telephone Encounter (Signed)
-----   Message from Reena Stark sent at 12/12/2023  8:02 AM EDT ----- Regarding: F/U APPT W/DR. RICE AFTER NEW CONDUCTOIN STUDY WITH DR. ELDONNA Patient has appt w/Dr. ELDONNA for Nerve Conduction Study on 01/02/2024. Please call patient to schedule f/u appt w/Dr. Jeannetta at least 1 week after appt with Dr. ELDONNA. Thank you.

## 2023-12-14 ENCOUNTER — Other Ambulatory Visit: Payer: Self-pay | Admitting: Registered Nurse

## 2023-12-18 ENCOUNTER — Telehealth: Payer: Self-pay

## 2023-12-18 NOTE — Telephone Encounter (Signed)
 Are you able to read patient split night,says it was completed

## 2023-12-19 ENCOUNTER — Ambulatory Visit: Admitting: Adult Health

## 2023-12-19 ENCOUNTER — Telehealth: Payer: Self-pay | Admitting: Registered Nurse

## 2023-12-19 ENCOUNTER — Encounter: Payer: Self-pay | Admitting: Adult Health

## 2023-12-19 VITALS — BP 112/60 | HR 84 | Temp 98.3°F | Ht 60.0 in | Wt 127.6 lb

## 2023-12-19 DIAGNOSIS — I272 Pulmonary hypertension, unspecified: Secondary | ICD-10-CM | POA: Diagnosis not present

## 2023-12-19 DIAGNOSIS — M419 Scoliosis, unspecified: Secondary | ICD-10-CM | POA: Diagnosis not present

## 2023-12-19 DIAGNOSIS — J9611 Chronic respiratory failure with hypoxia: Secondary | ICD-10-CM

## 2023-12-19 DIAGNOSIS — M5459 Other low back pain: Secondary | ICD-10-CM | POA: Diagnosis not present

## 2023-12-19 DIAGNOSIS — G8929 Other chronic pain: Secondary | ICD-10-CM

## 2023-12-19 DIAGNOSIS — J45909 Unspecified asthma, uncomplicated: Secondary | ICD-10-CM | POA: Diagnosis not present

## 2023-12-19 DIAGNOSIS — J4530 Mild persistent asthma, uncomplicated: Secondary | ICD-10-CM

## 2023-12-19 MED ORDER — ALBUTEROL SULFATE HFA 108 (90 BASE) MCG/ACT IN AERS: 1.0000 | INHALATION_SPRAY | RESPIRATORY_TRACT | 3 refills | Status: AC | PRN

## 2023-12-19 NOTE — Patient Instructions (Addendum)
 Continue on BREO 1 puff daily, rinse after use.  Albuterol  inhaler or Duoneb As needed   Saline nasal gel Twice daily   Continue on Oxygen  3l/m with activity and At bedtime  -goal is to keep O2 sats >88-90% Continue on current regimen with Cardiology  Flu shot this fall.  Call back if you change your mind for sleep study.  Follow up with Dr. Shelah or Cardin Nitschke NP in 4-6 months and As needed

## 2023-12-19 NOTE — Progress Notes (Unsigned)
 @Patient  ID: Hannah Scott, female    DOB: [birth year], 73 y.o.   MRN: [id number]  Chief Complaint  Patient presents with   Asthma    Referring provider: Geofm Glade JINNY, MD  HPI: 73 yo female never smoker followed for Asthma, Restrictive lung disease-severe scoliosis, Chronic respiratory failure on Oxygen   Medical history significant for breast cancer status postlumpectomy and tamoxifen ENT evaluation with paralyzed VC  Medical history significant for coronary artery disease, atrial septum aneurysm, congestive heart failure , pulmonary hypertension followed by cardiology, severe scoliosis   TEST/EVENTS :  CT chest Sep 10, 2021 clear lungs except for subsegmental atelectasis in the left lower lobe.  Elevation of bilateral hemidiaphragms left greater than right   2D echo April 14, 2022 EF 6065%, moderate LVH, grade 1 diastolic dysfunction moderately elevated pulmonary artery systolic pressure   Spirometry 2017 showed severe airflow obstruction FEV1 39%, ratio 56  12/19/2023 Follow up : Asthma, O2 RF, PAH  Discussed the use of AI scribe software for clinical note transcription with the patient, who gave verbal consent to proceed.  History of Present Illness Hannah Scott is a 73 year old female with pulmonary hypertension and asthma who presents for follow-up of her pulmonary conditions.  She uses oxygen  at night and as needed during the day, maintaining oxygen  levels typically above 88-90%. Her pulmonary hypertension is managed with Opsumit  and Totarol, and she recently started [employer], which requires administration training. She previously discontinued Uptrush due to adverse effects. She experiences significant fatigue.  Her asthma is managed with Breo once daily, and she rinses after use. She has not required frequent use of albuterol  or nebulizer treatments. Previous imaging, including a CT scan and x-ray of the lungs, was normal.  She has a history of rheumatoid arthritis, though  recent evaluations suggest it is currently inactive. She experiences numbness in her hands, suspected to be carpal tunnel syndrome, with a nerve test scheduled. She also has osteoarthritis, contributing to her back and leg pain, which affects her sleep.  She lives alone with her cat, uses a rolling walker for mobility outside the home, and drives herself. She has a history of scoliosis, which was not treated in her youth, and she believes it has worsened over time.  Her thyroid  function was last checked in April and was at the lower end of normal. She has been noted to have prediabetes in the past, but recent tests have not confirmed this. She regularly receives flu shots and had a pneumonia vaccine in 2017.     Allergies  Allergen Reactions   Fluticasone -Salmeterol Other (See Comments)    REACTION: headache.   She is tolerating Dulera  well.  Other reaction(s): Other (See Comments)  Other Reaction(s): Other (See Comments)  REACTION: headache. , She is tolerating Dulera  well.   Norvasc  [Amlodipine  Besylate] Swelling and Other (See Comments)    edema   Tape Other (See Comments)    Prefers paper tape Other reaction(s): Other (See Comments) Prefers paper tape- everything else rips my skin   Heparin     Morphine  Nausea Only    Immunization History  Administered Date(s) Administered   Fluad Quad(high Dose 65+) 01/04/2019, 01/20/2022   Fluad Trivalent(High Dose 65+) 02/07/2023   INFLUENZA, HIGH DOSE SEASONAL PF 01/06/2016, 01/10/2017, 03/16/2018   Influenza Inj Mdck Quad Pf 04/09/2021   Influenza Split 03/23/2011, 02/15/2012   Influenza Whole 02/12/2003, 12/24/2009   Influenza,inj,Quad PF,6+ Mos 01/29/2013, 01/03/2014, 02/11/2015   Influenza-Unspecified 04/09/2021   Moderna Covid-19  Vaccine Bivalent Booster 51yrs & up 03/26/2021   Moderna SARS-COV2 Booster Vaccination 04/10/2020   Moderna Sars-Covid-2 Vaccination 06/21/2019, 07/24/2019   Pneumococcal Conjugate-13 05/06/2015    Pneumococcal Polysaccharide-23 07/06/2016   Tdap 01/09/2019    Past Medical History:  Diagnosis Date   Anemia    hx   Arthritis    Asthma    PFTs, February, 2011, moderate obstructive disease with response to bronchodilators, normal lung volumes, moderate reduction in diffusing capacity   Atrial septal aneurysm    Echo, 2008-not noted on 13 echo   CAD (coronary artery disease)    90% distal LAD in the past  /   nuclear, 2008, no ischemia, ejection fraction 70%   Cancer (HCC) 2002   DUCTAL CIS--S/P LUMPECTOMY, RADIATION AND 6 WEEKS OF TAMOXIFEN   D-dimer, elevated    January, 2014   Depression    Ejection fraction    EF 60%, echo, October, 2008   Elevated CPK    January, 2014   Endometriosis 1989   RIGHT TUBE   Endometriosis 1987   LEFT TUBE/OVARY W FOCAL IN-SITU ENDOMETRIAL ADENOCARCINOMA   GERD (gastroesophageal reflux disease)    occ   H/O hiatal hernia    ?   High cholesterol    Hyperlipidemia    Hypertension    Hypothyroidism    Patient has had in the past that she does not need treatment   Kyphoscoliosis    Obstructive airway disease (HCC)    Pinched nerve    lower back   Shortness of breath    Echo 2/22: EF 60-65, no RWMA, mild LVH, GR 1  DD, GLS -24%, normal RVSF, mild MR, trivial AI, borderline dilation of aortic root (39 mm)   UTI (lower urinary tract infection)     Tobacco History: Social History   Tobacco Use  Smoking Status Never   Passive exposure: Never  Smokeless Tobacco Never   Counseling given: Not Answered   Outpatient Medications Prior to Visit  Medication Sig Dispense Refill   albuterol  (VENTOLIN  HFA) 108 (90 Base) MCG/ACT inhaler Inhale 2 puffs into the lungs every 4 (four) hours as needed for wheezing or shortness of breath. 1 each 11   aspirin  EC 81 MG tablet Take 1 tablet (81 mg total) by mouth daily. Swallow whole. 90 tablet 3   atorvastatin  (LIPITOR) 20 MG tablet Take 1 tablet (20 mg total) by mouth daily. 90 tablet 3    bisoprolol  (ZEBETA ) 5 MG tablet TAKE 1 TABLET BY MOUTH DAILY 90 tablet 1   clobetasol  cream (TEMOVATE ) 0.05 % Apply 1 Application topically as needed.     DULoxetine  (CYMBALTA ) 30 MG capsule TAKE ONE CAPSULE EVERY DAY IN ADDITION TO 60MG  CAPSULE FOR A TOTAL OF 90MG  DAILY 90 capsule 1   DULoxetine  (CYMBALTA ) 60 MG capsule TAKE ONE CAPSULE BY MOUTH EVERY DAY TAKE ALONG WITH 30MG  CAPSULE. TOTAL DAILY DOSE is 90MG . 90 capsule 1   Ensure (ENSURE) Take 237 mLs by mouth.     famotidine  (PEPCID ) 20 MG tablet TAKE ONE TABLET BY MOUTH EVERY DAY AFTER SUPPER 90 tablet 2   fluticasone  furoate-vilanterol (BREO ELLIPTA ) 100-25 MCG/ACT AEPB INHALE 1 PUFF INTO THE LUNGS DAILY 60 each 11   furosemide  (LASIX ) 20 MG tablet Take 2 tablets (40 mg total) by mouth daily. 180 tablet 3   gabapentin  (NEURONTIN ) 100 MG capsule Take 1 capsule (100 mg total) by mouth in the morning. 30 capsule 0   gabapentin  (NEURONTIN ) 600 MG tablet TAKE ONE  TABLET BY MOUTH EVERY DAY AT BEDTIME 90 tablet 1   HYDROcodone -acetaminophen  (NORCO) 10-325 MG tablet Take 1 tablet by mouth 3 (three) times daily as needed. Do Not Fill Before 12/13/2023 90 tablet 0   ipratropium-albuterol  (DUONEB) 0.5-2.5 (3) MG/3ML SOLN Take 3 mLs by nebulization every 6 (six) hours as needed (wheezing or SOB). During exacerbation 120 mL 3   levothyroxine  (SYNTHROID ) 100 MCG tablet TAKE 1 TABLET(100 MCG) BY MOUTH DAILY 90 tablet 2   macitentan  (OPSUMIT ) 10 MG tablet Take 1 tablet (10 mg total) by mouth daily.     Selexipag  (UPTRAVI  TITRATION) 200 & 800 MCG TBPK Take 200 mcg by mouth twice daily. Increase as tolerated to target dose of 1600 mcg BID.     tadalafil , PAH, (ADCIRCA ) 20 MG tablet Take 2 tablets (40 mg total) by mouth daily. 60 tablet 11   No facility-administered medications prior to visit.     Review of Systems:   Constitutional:   No  weight loss, night sweats,  Fevers, chills, fatigue, or  lassitude.  HEENT:   No headaches,  Difficulty swallowing,   Tooth/dental problems, or  Sore throat,                No sneezing, itching, ear ache, nasal congestion, post nasal drip,   CV:  No chest pain,  Orthopnea, PND, swelling in lower extremities, anasarca, dizziness, palpitations, syncope.   GI  No heartburn, indigestion, abdominal pain, nausea, vomiting, diarrhea, change in bowel habits, loss of appetite, bloody stools.   Resp: No shortness of breath with exertion or at rest.  No excess mucus, no productive cough,  No non-productive cough,  No coughing up of blood.  No change in color of mucus.  No wheezing.  No chest wall deformity  Skin: no rash or lesions.  GU: no dysuria, change in color of urine, no urgency or frequency.  No flank pain, no hematuria   MS:  No joint pain or swelling.  No decreased range of motion.  No back pain.    Physical Exam  There were no vitals taken for this visit.  GEN: A/Ox3; pleasant , NAD, well nourished    HEENT:  [place]/AT,  EACs-clear, TMs-wnl, NOSE-clear, THROAT-clear, no lesions, no postnasal drip or exudate noted.   NECK:  Supple w/ fair ROM; no JVD; normal carotid impulses w/o bruits; no thyromegaly or nodules palpated; no lymphadenopathy.    RESP  Clear  P & A; w/o, wheezes/ rales/ or rhonchi. no accessory muscle use, no dullness to percussion  CARD:  RRR, no m/r/g, no peripheral edema, pulses intact, no cyanosis or clubbing.  GI:   Soft & nt; nml bowel sounds; no organomegaly or masses detected.   Musco: Warm bil, no deformities or joint swelling noted.   Neuro: alert, no focal deficits noted.    Skin: Warm, no lesions or rashes    Lab Results:  CBC    Component Value Date/Time   WBC 3.7 (L) 11/10/2023 1558   RBC 3.80 (L) 11/10/2023 1558   HGB 11.6 (L) 11/10/2023 1558   HGB 13.7 03/13/2023 1208   HGB 13.1 03/24/2009 0941   HCT 36.0 11/10/2023 1558   HCT 44.5 03/13/2023 1208   HCT 39.5 03/24/2009 0941   PLT 156 11/10/2023 1558   PLT 186 03/13/2023 1208   MCV 94.7 11/10/2023 1558    MCV 91 03/13/2023 1208   MCV 95.5 03/24/2009 0941   MCH 30.5 11/10/2023 1558   MCHC 32.2 11/10/2023 1558  RDW 14.4 11/10/2023 1558   RDW 15.7 (H) 03/13/2023 1208   RDW 13.8 03/24/2009 0941   LYMPHSABS 0.7 08/08/2023 1517   LYMPHSABS 1.5 03/24/2009 0941   MONOABS 0.3 08/08/2023 1517   MONOABS 0.5 03/24/2009 0941   EOSABS 0.2 08/08/2023 1517   EOSABS 0.5 03/24/2009 0941   BASOSABS 0.0 08/08/2023 1517   BASOSABS 0.0 03/24/2009 0941    BMET    Component Value Date/Time   NA 138 11/10/2023 1558   NA 140 03/13/2023 1208   K 4.3 11/10/2023 1558   CL 100 11/10/2023 1558   CO2 31 11/10/2023 1558   GLUCOSE 97 11/10/2023 1558   BUN 19 11/10/2023 1558   BUN 22 03/13/2023 1208   CREATININE 0.88 11/10/2023 1558   CREATININE 0.82 12/13/2019 1430   CALCIUM  9.3 11/10/2023 1558   GFRNONAA >60 11/10/2023 1558   GFRNONAA 72 12/13/2019 1430   GFRAA 79 06/05/2020 1316   GFRAA 84 12/13/2019 1430    BNP    Component Value Date/Time   BNP 148.9 (H) 11/10/2023 1558    ProBNP    Component Value Date/Time   PROBNP 1,389 (H) 03/13/2023 1208   PROBNP 1,285.0 (H) 12/16/2022 1434    Imaging: No results found.  Administration History     None          Latest Ref Rng & Units 09/14/2023    1:36 PM  PFT Results  FVC-Pre L 1.00   FVC-Predicted Pre % 43   FVC-Post L 1.05   FVC-Predicted Post % 45   Pre FEV1/FVC % % 66   Post FEV1/FCV % % 65   FEV1-Pre L 0.66   FEV1-Predicted Pre % 38   FEV1-Post L 0.69   DLCO uncorrected ml/min/mmHg 8.76   DLCO UNC% % 53   DLCO corrected ml/min/mmHg 9.25   DLCO COR %Predicted % 56   DLVA Predicted % 100   TLC L 2.71   TLC % Predicted % 61   RV % Predicted % 82     No results found for: NITRICOXIDE      Assessment & Plan:   No problem-specific Assessment & Plan notes found for this encounter.  Assessment and Plan Assessment & Plan Pulmonary hypertension   Pulmonary hypertension is managed with Opsumit  and tadalafil .  Uptravi  was discontinued due to adverse effects. Wenrevir has been prescribed and will be administered after training. The goal is to reduce pulmonary pressures. Oxygen  saturation remains stable at 92% at rest, with oxygen  therapy at 3 liters during activity and sleep. Continue Opsumit  and tadalafil , and administer Wenrevir post-training. Use oxygen  therapy at 3 liters during activity and sleep. Follow up with Doctor Rolan for management.  Asthma   Asthma is well-managed with no recent exacerbations. She continues Breo once daily and is aware of the need to rinse after use. Albuterol  refills have been provided. Continue Breo once daily, rinse mouth after use, and provide albuterol  refill.  Kyphoscoliosis with chronic low back pain   Kyphoscoliosis contributes to lung restriction and chronic low back pain. No specific treatments for scoliosis were pursued in the past. Pain is persistent, particularly in the low back.  Osteoarthritis   Osteoarthritis is confirmed by a rheumatologist with no active rheumatoid arthritis signs. Numbness in hands is attributed to carpal tunnel syndrome. The rheumatologist plans to monitor the condition. Continue monitoring by the rheumatologist and undergo a nerve test for carpal tunnel syndrome.  Chronic fatigue   Chronic fatigue persists, likely due to pulmonary hypertension,  kyphoscoliosis, and other comorbidities. Thyroid  function is normal. No diabetes, but prediabetes is noted. Monitor thyroid  function and watch for signs of diabetes.     Madelin Stank, NP 12/19/2023

## 2023-12-19 NOTE — Telephone Encounter (Signed)
 Call placed to Hannah Scott regarding Gabapentin  600 mg, no answer. Left message to return the call.

## 2023-12-20 ENCOUNTER — Telehealth: Payer: Self-pay | Admitting: Registered Nurse

## 2023-12-20 NOTE — Telephone Encounter (Signed)
 Advanced Heart Failure Patient Advocate Encounter  Spoke with patient. She received the Winrevair on Friday. Has not spoken with the nurse yet. They have been playing phone tag.   Advised her to call back if she continues having issues with reaching [place]. I will reach out to [facility], if that continues to be an issue.   Almarie JULIANNA Pa, CPhT

## 2023-12-20 NOTE — Telephone Encounter (Signed)
 Patient returned your phone call.

## 2023-12-20 NOTE — Telephone Encounter (Signed)
 Return Ms. Rumberger call, no answer. Left message to return the call.

## 2023-12-21 ENCOUNTER — Telehealth: Payer: Self-pay | Admitting: Registered Nurse

## 2023-12-21 MED ORDER — GABAPENTIN 600 MG PO TABS: ORAL_TABLET | ORAL | 1 refills | Status: DC

## 2023-12-21 NOTE — Telephone Encounter (Signed)
 Return Hannah Scott call,  She is taking her Gabapentin  600 mg nightly, Gabapentin  100 mg daily.  Prescription sent to the pharmacy.  Hannah Scott verbalizes understanding.

## 2023-12-21 NOTE — Telephone Encounter (Signed)
 Returning your call please call her before lunch time today.  She has an appt this afternoon.

## 2024-01-01 DIAGNOSIS — H34832 Tributary (branch) retinal vein occlusion, left eye, with macular edema: Secondary | ICD-10-CM | POA: Diagnosis not present

## 2024-01-02 ENCOUNTER — Ambulatory Visit: Admitting: Physical Medicine and Rehabilitation

## 2024-01-02 DIAGNOSIS — M79641 Pain in right hand: Secondary | ICD-10-CM

## 2024-01-02 DIAGNOSIS — G894 Chronic pain syndrome: Secondary | ICD-10-CM

## 2024-01-02 DIAGNOSIS — R29898 Other symptoms and signs involving the musculoskeletal system: Secondary | ICD-10-CM

## 2024-01-02 DIAGNOSIS — M79642 Pain in left hand: Secondary | ICD-10-CM | POA: Diagnosis not present

## 2024-01-02 DIAGNOSIS — R202 Paresthesia of skin: Secondary | ICD-10-CM

## 2024-01-02 DIAGNOSIS — R200 Anesthesia of skin: Secondary | ICD-10-CM | POA: Diagnosis not present

## 2024-01-02 NOTE — Progress Notes (Signed)
 Pain Scale   Average Pain 0 Patient advising she has numbness, tingling and weakness in her right hand and some numbness in her left hand. Patient is right hand dominate        +Driver, -BT, -Dye Allergies.

## 2024-01-08 NOTE — Procedures (Signed)
 EMG & NCV Findings: Evaluation of the left median motor and the right median motor nerves showed prolonged distal onset latency (L6.2, R6.5 ms), reduced amplitude (L1.8, R0.1 mV), and decreased conduction velocity (Elbow-Wrist, L46, R15 m/s).  The left median (across palm) sensory nerve showed prolonged distal peak latency (Wrist, 5.1 ms) and prolonged distal peak latency (Palm, 4.2 ms).  The right median (across palm) sensory nerve showed prolonged distal peak latency (Wrist, 4.8 ms), reduced amplitude (3.0 V), and prolonged distal peak latency (Palm, 5.6 ms).  The left ulnar sensory nerve showed prolonged distal peak latency (4.1 ms) and decreased conduction velocity (Wrist-5th Digit, 34 m/s).  The right ulnar sensory nerve showed prolonged distal peak latency (4.0 ms), reduced amplitude (8.9 V), and decreased conduction velocity (Wrist-5th Digit, 35 m/s).  All remaining nerves (as indicated in the following tables) were within normal limits.  Left vs. Right side comparison data for the median motor nerve indicates abnormal L-R amplitude difference (94.4 %) and abnormal L-R velocity difference (Elbow-Wrist, 31 m/s).  The ulnar motor nerve indicates abnormal L-R velocity difference (A Elbow-B Elbow, 18 m/s).  All remaining left vs. right side differences were within normal limits.    Needle evaluation of the right abductor pollicis brevis muscle showed decreased insertional activity, widespread spontaneous activity, and diminished recruitment.  All remaining muscles (as indicated in the following table) showed no evidence of electrical instability.    Impression: The above electrodiagnostic study is ABNORMAL and reveals evidence of a severe bilateral median nerve entrapment at the wrist (carpal tunnel syndrome) affecting sensory and motor components. The lesion is characterized by sensory and motor demyelination with evidence of significant axonal injury.   There is no significant electrodiagnostic  evidence of any other focal nerve entrapment, brachial plexopathy or cervical radiculopathy.   Recommendations: 1.  Follow-up with referring physician. 2.  Continue current management of symptoms. 3.  Suggest surgical evaluation.  ___________________________ Prentice Masters FAAPMR Board Certified, American Board of Physical Medicine and Rehabilitation    Nerve Conduction Studies Anti Sensory Summary Table   Stim Site NR Peak (ms) Norm Peak (ms) P-T Amp (V) Norm P-T Amp Site1 Site2 Delta-P (ms) Dist (cm) Vel (m/s) Norm Vel (m/s)  Left Median Acr Palm Anti Sensory (2nd Digit)  31.1C  Wrist    *5.1 <3.6 19.0 >10 Wrist Palm 0.9 0.0    Palm    *4.2 <2.0 1.4         Right Median Acr Palm Anti Sensory (2nd Digit)  30.7C  Wrist    *4.8 <3.6 *3.0 >10 Wrist Palm 0.8 0.0    Palm    *5.6 <2.0 4.3         Left Radial Anti Sensory (Base 1st Digit)  31.1C  Wrist    2.3 <3.1 32.3  Wrist Base 1st Digit 2.3 0.0    Right Radial Anti Sensory (Base 1st Digit)  30C  Wrist    2.5 <3.1 21.0  Wrist Base 1st Digit 2.5 0.0    Left Ulnar Anti Sensory (5th Digit)  31.5C  Wrist    *4.1 <3.7 22.5 >15.0 Wrist 5th Digit 4.1 14.0 *34 >38  Right Ulnar Anti Sensory (5th Digit)  30.7C  Wrist    *4.0 <3.7 *8.9 >15.0 Wrist 5th Digit 4.0 14.0 *35 >38   Motor Summary Table   Stim Site NR Onset (ms) Norm Onset (ms) O-P Amp (mV) Norm O-P Amp Site1 Site2 Delta-0 (ms) Dist (cm) Vel (m/s) Norm Vel (m/s)  Left Median  Motor (Abd Poll Brev)  31.3C  Wrist    *6.2 <4.2 *1.8 >5 Elbow Wrist 4.5 20.5 *46 >50  Elbow    10.7  1.4         Right Median Motor (Abd Poll Brev)  30.4C  Wrist    *6.5 <4.2 *0.1 >5 Elbow Wrist 13.9 20.5 *15 >50  Elbow    20.4  0.1         Left Ulnar Motor (Abd Dig Min)  31.5C  Wrist    3.2 <4.2 7.3 >3 B Elbow Wrist 3.3 19.0 58 >53  B Elbow    6.5  6.1  A Elbow B Elbow 1.3 10.0 77 >53  A Elbow    7.8  7.5         Right Ulnar Motor (Abd Dig Min)  30.3C  Wrist    3.7 <4.2 6.6 >3 B Elbow Wrist 3.4  18.5 54 >53  B Elbow    7.1  6.4  A Elbow B Elbow 1.7 10.0 59 >53  A Elbow    8.8  6.5          EMG   Side Muscle Nerve Root Ins Act Fibs Psw Amp Dur Poly Recrt Int Bruna Comment  Right Abd Poll Brev Median C8-T1 *Decr *4+ *4+ Nml Nml 0 *Reduced Nml   Right 1stDorInt Ulnar C8-T1 Nml Nml Nml Nml Nml 0 Nml Nml   Right PronatorTeres Median C6-7 Nml Nml Nml Nml Nml 0 Nml Nml   Right Biceps Musculocut C5-6 Nml Nml Nml Nml Nml 0 Nml Nml   Right Deltoid Axillary C5-6 Nml Nml Nml Nml Nml 0 Nml Nml     Nerve Conduction Studies Anti Sensory Left/Right Comparison   Stim Site L Lat (ms) R Lat (ms) L-R Lat (ms) L Amp (V) R Amp (V) L-R Amp (%) Site1 Site2 L Vel (m/s) R Vel (m/s) L-R Vel (m/s)  Median Acr Palm Anti Sensory (2nd Digit)  31.1C  Wrist *5.1 *4.8 0.3 19.0 *3.0 84.2 Wrist Palm     Palm *4.2 *5.6 1.4 1.4 4.3 67.4       Radial Anti Sensory (Base 1st Digit)  31.1C  Wrist 2.3 2.5 0.2 32.3 21.0 35.0 Wrist Base 1st Digit     Ulnar Anti Sensory (5th Digit)  31.5C  Wrist *4.1 *4.0 0.1 22.5 *8.9 60.4 Wrist 5th Digit *34 *35 1   Motor Left/Right Comparison   Stim Site L Lat (ms) R Lat (ms) L-R Lat (ms) L Amp (mV) R Amp (mV) L-R Amp (%) Site1 Site2 L Vel (m/s) R Vel (m/s) L-R Vel (m/s)  Median Motor (Abd Poll Brev)  31.3C  Wrist *6.2 *6.5 0.3 *1.8 *0.1 *94.4 Elbow Wrist *46 *15 *31  Elbow 10.7 20.4 9.7 1.4 0.1 92.9       Ulnar Motor (Abd Dig Min)  31.5C  Wrist 3.2 3.7 0.5 7.3 6.6 9.6 B Elbow Wrist 58 54 4  B Elbow 6.5 7.1 0.6 6.1 6.4 4.7 A Elbow B Elbow 77 59 *18  A Elbow 7.8 8.8 1.0 7.5 6.5 13.3          Waveforms:

## 2024-01-11 ENCOUNTER — Encounter: Payer: Self-pay | Admitting: Registered Nurse

## 2024-01-11 ENCOUNTER — Encounter: Attending: Registered Nurse | Admitting: Registered Nurse

## 2024-01-11 VITALS — BP 130/77 | Ht <= 58 in | Wt 126.2 lb

## 2024-01-11 DIAGNOSIS — M25511 Pain in right shoulder: Secondary | ICD-10-CM | POA: Insufficient documentation

## 2024-01-11 DIAGNOSIS — M24551 Contracture, right hip: Secondary | ICD-10-CM | POA: Insufficient documentation

## 2024-01-11 DIAGNOSIS — Z79891 Long term (current) use of opiate analgesic: Secondary | ICD-10-CM | POA: Diagnosis not present

## 2024-01-11 DIAGNOSIS — Z5181 Encounter for therapeutic drug level monitoring: Secondary | ICD-10-CM | POA: Insufficient documentation

## 2024-01-11 DIAGNOSIS — G8929 Other chronic pain: Secondary | ICD-10-CM | POA: Insufficient documentation

## 2024-01-11 DIAGNOSIS — M792 Neuralgia and neuritis, unspecified: Secondary | ICD-10-CM | POA: Diagnosis not present

## 2024-01-11 DIAGNOSIS — M25562 Pain in left knee: Secondary | ICD-10-CM | POA: Insufficient documentation

## 2024-01-11 DIAGNOSIS — G894 Chronic pain syndrome: Secondary | ICD-10-CM | POA: Diagnosis not present

## 2024-01-11 DIAGNOSIS — M48062 Spinal stenosis, lumbar region with neurogenic claudication: Secondary | ICD-10-CM | POA: Diagnosis not present

## 2024-01-11 MED ORDER — GABAPENTIN 100 MG PO CAPS: 100.0000 mg | ORAL_CAPSULE | Freq: Every morning | ORAL | 1 refills | Status: DC

## 2024-01-11 MED ORDER — HYDROCODONE-ACETAMINOPHEN 10-325 MG PO TABS: 1.0000 | ORAL_TABLET | Freq: Three times a day (TID) | ORAL | 0 refills | Status: DC | PRN

## 2024-01-11 NOTE — Progress Notes (Signed)
 Subjective:    Patient ID: Hannah Scott, female    DOB: 1950-05-02, 72 y.o.   MRN: [id number]  HPI: Hannah Scott is a 73 y.o. female who returns for follow up appointment for chronic pain and medication refill. She states her pain is located in her right shoulder, lower back, right hip and left knee pain.  Also reports right finger tips with numbness, She rates her pain 7. Her current exercise regime is walking with walker  and performing stretching exercises.  Ms. Broy Morphine  equivalent is 30.00 MME.   Oral Swab was Performed today.     Pain Inventory Average Pain 7 Pain Right Now 7 My pain is constant, burning, tingling, and aching  In the last 24 hours, has pain interfered with the following? General activity 10 Relation with others 10 Enjoyment of life 10 What TIME of day is your pain at its worst? morning , daytime, evening, and night Sleep (in general) Poor  Pain is worse with: walking and standing Pain improves with: rest and medication Relief from Meds: 6  Family History  Problem Relation Age of Onset   Heart failure Mother    Pneumonia Mother    Hypertension Mother    Heart disease Mother    Stroke Maternal Grandfather    Hypertension Maternal Grandfather    Heart attack Father    Heart disease Father    Asthma Paternal Uncle        PAT UNCLES   Heart attack Paternal Uncle    Social History   Socioeconomic History   Marital status: Widowed    Spouse name: Not on file   Number of children: Not on file   Years of education: Not on file   Highest education level: Not on file  Occupational History   Occupation: RETIRED  Tobacco Use   Smoking status: Never    Passive exposure: Never   Smokeless tobacco: Never  Vaping Use   Vaping status: Never Used  Substance and Sexual Activity   Alcohol  use: No    Alcohol /week: 0.0 standard drinks of alcohol    Drug use: No   Sexual activity: Not Currently    Partners: Male    Birth control/protection: Surgical   Other Topics Concern   Not on file  Social History Narrative   Widowed in 2015   2 sons 1 lives in the area the other is in close contact /2025   1 grandchild   Never smoker no drug use no alcohol    Social Drivers of Corporate investment banker Strain: Medium Risk (08/01/2023)   Overall Financial Resource Strain (CARDIA)    Difficulty of Paying Living Expenses: Somewhat hard  Food Insecurity: No Food Insecurity (08/01/2023)   Hunger Vital Sign    Worried About Running Out of Food in the Last Year: Never true    Ran Out of Food in the Last Year: Never true  Transportation Needs: No Transportation Needs (08/01/2023)   PRAPARE - Administrator, Civil Service (Medical): No    Lack of Transportation (Non-Medical): No  Physical Activity: Inactive (08/01/2023)   Exercise Vital Sign    Days of Exercise per Week: 0 days    Minutes of Exercise per Session: 0 min  Stress: Stress Concern Present (08/01/2023)   [employer] of Occupational Health - Occupational Stress Questionnaire    Feeling of Stress : To some extent  Social Connections: Socially Isolated (08/01/2023)   Social Connection and Isolation Panel  Frequency of Communication with Friends and Family: More than three times a week    Frequency of Social Gatherings with Friends and Family: Once a week    Attends Religious Services: Never    Database administrator or Organizations: No    Attends Banker Meetings: Never    Marital Status: Widowed   Past Surgical History:  Procedure Laterality Date   ANKLE SURGERY Left    ligament   APPENDECTOMY  1987   AT [facility]   BACK SURGERY     Fusion   BREAST LUMPECTOMY  2003   radiation on right   BREAST LUMPECTOMY WITH RADIOACTIVE SEED LOCALIZATION Left 02/12/2016   Procedure: LEFT BREAST LUMPECTOMY WITH RADIOACTIVE SEED LOCALIZATION;  Surgeon: Morene Olives, MD;  Location: [facility];  Service: General;  Laterality: Left;   CARDIOVASCULAR STRESS  TEST  09/07/05   Nuclear, was negative   CATARACT EXTRACTION Bilateral 01,03   COLONOSCOPY WITH PROPOFOL  N/A 05/04/2021   Procedure: COLONOSCOPY WITH PROPOFOL ;  Surgeon: Avram Lupita BRAVO, MD;  Location: [facility];  Service: Endoscopy;  Laterality: N/A;   ESOPHAGOGASTRODUODENOSCOPY (EGD) WITH PROPOFOL  N/A 05/04/2021   Procedure: ESOPHAGOGASTRODUODENOSCOPY (EGD) WITH PROPOFOL ;  Surgeon: Avram Lupita BRAVO, MD;  Location: [facility];  Service: Endoscopy;  Laterality: N/A;   OOPHORECTOMY     LSO -RSO   PELVIC LAPAROSCOPY  1989   RSO, LYSIS OF ADHESIONS   POLYPECTOMY  05/04/2021   Procedure: POLYPECTOMY;  Surgeon: Avram Lupita BRAVO, MD;  Location: [facility];  Service: Endoscopy;;   RIGHT/LEFT HEART CATH AND CORONARY ANGIOGRAPHY N/A 03/28/2023   Procedure: RIGHT/LEFT HEART CATH AND CORONARY ANGIOGRAPHY;  Surgeon: Rolan Ezra RAMAN, MD;  Location: [facility];  Service: Cardiovascular;  Laterality: N/A;   TOTAL ABDOMINAL HYSTERECTOMY  1987   LSO, APPENDECTOMY   TOTAL HIP ARTHROPLASTY Right 10/28/2013   dr barbarann   TOTAL HIP ARTHROPLASTY Right 10/28/2013   Procedure: TOTAL HIP ARTHROPLASTY ANTERIOR APPROACH;  Surgeon: Oneil JAYSON barbarann, MD;  Location: [facility];  Service: Orthopedics;  Laterality: Right;  Right Total Hip Arthroplasty, Direct Anterior Approach   Past Surgical History:  Procedure Laterality Date   ANKLE SURGERY Left    ligament   APPENDECTOMY  1987   AT [facility]   BACK SURGERY     Fusion   BREAST LUMPECTOMY  2003   radiation on right   BREAST LUMPECTOMY WITH RADIOACTIVE SEED LOCALIZATION Left 02/12/2016   Procedure: LEFT BREAST LUMPECTOMY WITH RADIOACTIVE SEED LOCALIZATION;  Surgeon: Morene Olives, MD;  Location: [facility];  Service: General;  Laterality: Left;   CARDIOVASCULAR STRESS TEST  09/07/05   Nuclear, was negative   CATARACT EXTRACTION Bilateral 01,03   COLONOSCOPY WITH PROPOFOL  N/A 05/04/2021   Procedure: COLONOSCOPY WITH PROPOFOL ;  Surgeon: Avram Lupita BRAVO,  MD;  Location: [facility];  Service: Endoscopy;  Laterality: N/A;   ESOPHAGOGASTRODUODENOSCOPY (EGD) WITH PROPOFOL  N/A 05/04/2021   Procedure: ESOPHAGOGASTRODUODENOSCOPY (EGD) WITH PROPOFOL ;  Surgeon: Avram Lupita BRAVO, MD;  Location: [facility];  Service: Endoscopy;  Laterality: N/A;   OOPHORECTOMY     LSO -RSO   PELVIC LAPAROSCOPY  1989   RSO, LYSIS OF ADHESIONS   POLYPECTOMY  05/04/2021   Procedure: POLYPECTOMY;  Surgeon: Avram Lupita BRAVO, MD;  Location: [facility];  Service: Endoscopy;;   RIGHT/LEFT HEART CATH AND CORONARY ANGIOGRAPHY N/A 03/28/2023   Procedure: RIGHT/LEFT HEART CATH AND CORONARY ANGIOGRAPHY;  Surgeon: Rolan Ezra RAMAN, MD;  Location: [facility] INVASIVE CV LAB;  Service: Cardiovascular;  Laterality: N/A;   TOTAL ABDOMINAL HYSTERECTOMY  1987   LSO, APPENDECTOMY   TOTAL HIP ARTHROPLASTY Right 10/28/2013   dr barbarann   TOTAL HIP ARTHROPLASTY Right 10/28/2013   Procedure: TOTAL HIP ARTHROPLASTY ANTERIOR APPROACH;  Surgeon: Oneil JAYSON barbarann, MD;  Location: [facility];  Service: Orthopedics;  Laterality: Right;  Right Total Hip Arthroplasty, Direct Anterior Approach   Past Medical History:  Diagnosis Date   Anemia    hx   Arthritis    Asthma    PFTs, February, 2011, moderate obstructive disease with response to bronchodilators, normal lung volumes, moderate reduction in diffusing capacity   Atrial septal aneurysm    Echo, 2008-not noted on 13 echo   CAD (coronary artery disease)    90% distal LAD in the past  /   nuclear, 2008, no ischemia, ejection fraction 70%   Cancer (HCC) 2002   DUCTAL CIS--S/P LUMPECTOMY, RADIATION AND 6 WEEKS OF TAMOXIFEN   D-dimer, elevated    January, 2014   Depression    Ejection fraction    EF 60%, echo, October, 2008   Elevated CPK    January, 2014   Endometriosis 1989   RIGHT TUBE   Endometriosis 1987   LEFT TUBE/OVARY W FOCAL IN-SITU ENDOMETRIAL ADENOCARCINOMA   GERD (gastroesophageal reflux disease)    occ   H/O hiatal hernia    ?   High  cholesterol    Hyperlipidemia    Hypertension    Hypothyroidism    Patient has had in the past that she does not need treatment   Kyphoscoliosis    Obstructive airway disease (HCC)    Pinched nerve    lower back   Shortness of breath    Echo 2/22: EF 60-65, no RWMA, mild LVH, GR 1  DD, GLS -24%, normal RVSF, mild MR, trivial AI, borderline dilation of aortic root (39 mm)   UTI (lower urinary tract infection)    BP 130/77   Ht 4' 9 (1.448 m)   Wt 126 lb 3.2 oz (57.2 kg)   BMI 27.31 kg/m   Opioid Risk Score:   Fall Risk Score:  `1  Depression screen [facility] 2/9     01/11/2024    3:25 PM 10/02/2023    2:05 PM 08/24/2023    2:14 PM 08/01/2023    3:51 PM 06/20/2023    1:37 PM 04/14/2023   10:53 AM 03/16/2023    2:16 PM  Depression screen PHQ 2/9  Decreased Interest 0 0 1 2 0 0 0  Down, Depressed, Hopeless 0 0 1 3 0 0 0  PHQ - 2 Score 0 0 2 5 0 0 0  Altered sleeping    1     Tired, decreased energy    1     Change in appetite    0     Feeling bad or failure about yourself     3     Trouble concentrating    1     Moving slowly or fidgety/restless    0     Suicidal thoughts    0     PHQ-9 Score    11     Difficult doing work/chores    Somewhat difficult        Review of Systems  Musculoskeletal:  Positive for gait problem.       Right hand hip  and shoulder, bil knee  All other systems reviewed and are negative.  Objective:   Physical Exam Vitals and nursing note reviewed.  Constitutional:      Appearance: Normal appearance.  Cardiovascular:     Rate and Rhythm: Normal rate and regular rhythm.     Pulses: Normal pulses.     Heart sounds: Normal heart sounds.  Pulmonary:     Effort: Pulmonary effort is normal.     Breath sounds: Normal breath sounds.  Musculoskeletal:     Comments: Normal Muscle Bulk and Muscle Testing Reveals:  Upper Extremities: Right: Decreased ROM 45 Degrees  and Muscle Strength 5/5 Right AC Joint Tenderness Left Upper Extremity: Full ROM  and Muscle Strength 5/5 Lumbar Paraspinal Tenderness: L-4-L-5 Right Greater Trochanter Tenderness Lower Extremities: Full ROM and Muscle Strength 5/5 Right Lower Extremity Flexion Produces Pain into her Right Hip Left Lower Extremity Flexion Produces Pain into her Left Patella Arises from Table slowly using walker for support Antalgic  Gait     Skin:    General: Skin is warm and dry.  Neurological:     Mental Status: She is alert and oriented to person, place, and time.  Psychiatric:        Mood and Affect: Mood normal.        Behavior: Behavior normal.          Assessment & Plan:  1. Lumbar post laminectomy syndrome with severe kyphoscoliosis thoracolumbar spine, s/p lumbar fusion. 01/11/2024 Continue current medication regimen. Refilled:  Hydrocodone  10/325mg  one tablet every 8 hours #90. We will continue the opioid monitoring program, this consists of regular clinic visits, examinations, urine drug screen, pill counts as well as use of [place]  Controlled Substance Reporting system. A 12 month History has been reviewed on the [place]  Controlled Substance Reporting System on 12/07/2023 2. Right Hip OA: S/P Right Hip Replacement 10/28/2013. 09/18/ 2025 3.Depression: Continue Cymbalta . Has Family Support and Friends.  Counseling with Juliene. 01/11/2024 4. Bilateral Hip Pain/ Right Greater Trochanteric Tenderness: No complaints today. Continue to Monitor. Continue with Ice and Heat Therapy. 01/11/2024 5. Poor Appetite/ Frail: No complaints today. Ms. Potenza states PCP following. Continue to monitor. 01/11/2024 6. Chronic Left  Knee Pain: Continue HEP as Tolerated. Continue to Monitor. 01/11/2024 7. Chronic Right  shoulder pain:  Continue HEP as tolerated. Continue to Monitor. 01/11/2024  8. Neuropathic Pain: Continue Gabapentin . Continue to Monitor. 01/11/2024   F/U in 1 month

## 2024-01-14 ENCOUNTER — Encounter: Payer: Self-pay | Admitting: Physical Medicine and Rehabilitation

## 2024-01-14 NOTE — Progress Notes (Signed)
 Hannah Scott - 73 y.o. female MRN [id number]  Date of birth: 23-Apr-1951  Office Visit Note: Visit Date: 01/02/2024 PCP: Geofm Glade JINNY, MD Referred by: Jeannetta Lonni ORN, MD  Subjective: Chief Complaint  Patient presents with   Right Hand - Numbness, Weakness, Pain   Left Hand - Numbness   HPI: Hannah Scott is a 73 y.o. female who comes in today at the request of Dr. Lonni Jeannetta for evaluation and management of chronic, worsening and severe pain, numbness and tingling in the Bilateral upper extremities.  Patient is Right hand dominant.  She reports ongoing symptoms now for quite some time with worsening in both hands.  She does endorse paresthesias more of the radial digits bilaterally but can be somewhat global.  Symptoms seem to radiate or refer up the arm to the elbow.  She gets some occasional pain down the arms.  She has history of neck and low back pain for which she sees chronic pain management and physical medicine rehabilitation at [facility].  She sees Fidela Ned, FNP.  She is on chronic medications for neuropathic pain but this does not help her hands.  No history of diabetes.  She does have thyroid  disease.  She does have pain in the low back and legs.  No history of polyneuropathy.  More history of perhaps palindromic rheumatism although not really diagnosed with rheumatoid arthritis.   I spent more than 30 minutes speaking face-to-face with the patient with 50% of the time in counseling and discussing coordination of care.      Review of Systems  Musculoskeletal:  Positive for back pain, joint pain and neck pain.  Neurological:  Positive for tingling, sensory change, focal weakness and weakness.  All other systems reviewed and are negative.  Otherwise per HPI.  Assessment & Plan: Visit Diagnoses:    ICD-10-CM   1. Paresthesia of skin  R20.2 NCV with EMG (electromyography)    2. Bilateral hand numbness  R20.0     3. Pain in left hand  M79.642     4. Pain in right  hand  M79.641     5. Weakness of both hands  R29.898     6. Chronic pain syndrome  G89.4        Plan: Impression: Clinically likely has fairly severe carpal tunnel syndrome underlying chronic pain syndrome and osteoarthritis pain of both hands and just generalized musculoskeletal chronic pain syndrome.  Electrodiagnostic study performed today.  The above electrodiagnostic study is ABNORMAL and reveals evidence of a severe bilateral median nerve entrapment at the wrist (carpal tunnel syndrome) affecting sensory and motor components. The lesion is characterized by sensory and motor demyelination with evidence of significant axonal injury.   There is no significant electrodiagnostic evidence of any other focal nerve entrapment, brachial plexopathy or cervical radiculopathy.   Recommendations: 1.  Follow-up with referring physician. 2.  Continue current management of symptoms. 3.  Suggest surgical evaluation.  Recommend referral to Gildardo Alderton, MD  Meds & Orders: No orders of the defined types were placed in this encounter.   Orders Placed This Encounter  Procedures   NCV with EMG (electromyography)    Follow-up: Return for Lonni Jeannetta, MD.   Procedures: No procedures performed  EMG & NCV Findings: Evaluation of the left median motor and the right median motor nerves showed prolonged distal onset latency (L6.2, R6.5 ms), reduced amplitude (L1.8, R0.1 mV), and decreased conduction velocity (Elbow-Wrist, L46, R15 m/s).  The left median (across  palm) sensory nerve showed prolonged distal peak latency (Wrist, 5.1 ms) and prolonged distal peak latency (Palm, 4.2 ms).  The right median (across palm) sensory nerve showed prolonged distal peak latency (Wrist, 4.8 ms), reduced amplitude (3.0 V), and prolonged distal peak latency (Palm, 5.6 ms).  The left ulnar sensory nerve showed prolonged distal peak latency (4.1 ms) and decreased conduction velocity (Wrist-5th Digit, 34 m/s).  The right  ulnar sensory nerve showed prolonged distal peak latency (4.0 ms), reduced amplitude (8.9 V), and decreased conduction velocity (Wrist-5th Digit, 35 m/s).  All remaining nerves (as indicated in the following tables) were within normal limits.  Left vs. Right side comparison data for the median motor nerve indicates abnormal L-R amplitude difference (94.4 %) and abnormal L-R velocity difference (Elbow-Wrist, 31 m/s).  The ulnar motor nerve indicates abnormal L-R velocity difference (A Elbow-B Elbow, 18 m/s).  All remaining left vs. right side differences were within normal limits.    Needle evaluation of the right abductor pollicis brevis muscle showed decreased insertional activity, widespread spontaneous activity, and diminished recruitment.  All remaining muscles (as indicated in the following table) showed no evidence of electrical instability.    Impression: The above electrodiagnostic study is ABNORMAL and reveals evidence of a severe bilateral median nerve entrapment at the wrist (carpal tunnel syndrome) affecting sensory and motor components. The lesion is characterized by sensory and motor demyelination with evidence of significant axonal injury.   There is no significant electrodiagnostic evidence of any other focal nerve entrapment, brachial plexopathy or cervical radiculopathy.   Recommendations: 1.  Follow-up with referring physician. 2.  Continue current management of symptoms. 3.  Suggest surgical evaluation.  ___________________________ Prentice Masters FAAPMR Board Certified, American Board of Physical Medicine and Rehabilitation    Nerve Conduction Studies Anti Sensory Summary Table   Stim Site NR Peak (ms) Norm Peak (ms) P-T Amp (V) Norm P-T Amp Site1 Site2 Delta-P (ms) Dist (cm) Vel (m/s) Norm Vel (m/s)  Left Median Acr Palm Anti Sensory (2nd Digit)  31.1C  Wrist    *5.1 <3.6 19.0 >10 Wrist Palm 0.9 0.0    Palm    *4.2 <2.0 1.4         Right Median Acr Palm Anti Sensory  (2nd Digit)  30.7C  Wrist    *4.8 <3.6 *3.0 >10 Wrist Palm 0.8 0.0    Palm    *5.6 <2.0 4.3         Left Radial Anti Sensory (Base 1st Digit)  31.1C  Wrist    2.3 <3.1 32.3  Wrist Base 1st Digit 2.3 0.0    Right Radial Anti Sensory (Base 1st Digit)  30C  Wrist    2.5 <3.1 21.0  Wrist Base 1st Digit 2.5 0.0    Left Ulnar Anti Sensory (5th Digit)  31.5C  Wrist    *4.1 <3.7 22.5 >15.0 Wrist 5th Digit 4.1 14.0 *34 >38  Right Ulnar Anti Sensory (5th Digit)  30.7C  Wrist    *4.0 <3.7 *8.9 >15.0 Wrist 5th Digit 4.0 14.0 *35 >38   Motor Summary Table   Stim Site NR Onset (ms) Norm Onset (ms) O-P Amp (mV) Norm O-P Amp Site1 Site2 Delta-0 (ms) Dist (cm) Vel (m/s) Norm Vel (m/s)  Left Median Motor (Abd Poll Brev)  31.3C  Wrist    *6.2 <4.2 *1.8 >5 Elbow Wrist 4.5 20.5 *46 >50  Elbow    10.7  1.4         Right Median Motor (Abd  Poll Brev)  30.4C  Wrist    *6.5 <4.2 *0.1 >5 Elbow Wrist 13.9 20.5 *15 >50  Elbow    20.4  0.1         Left Ulnar Motor (Abd Dig Min)  31.5C  Wrist    3.2 <4.2 7.3 >3 B Elbow Wrist 3.3 19.0 58 >53  B Elbow    6.5  6.1  A Elbow B Elbow 1.3 10.0 77 >53  A Elbow    7.8  7.5         Right Ulnar Motor (Abd Dig Min)  30.3C  Wrist    3.7 <4.2 6.6 >3 B Elbow Wrist 3.4 18.5 54 >53  B Elbow    7.1  6.4  A Elbow B Elbow 1.7 10.0 59 >53  A Elbow    8.8  6.5          EMG   Side Muscle Nerve Root Ins Act Fibs Psw Amp Dur Poly Recrt Int Bruna Comment  Right Abd Poll Brev Median C8-T1 *Decr *4+ *4+ Nml Nml 0 *Reduced Nml   Right 1stDorInt Ulnar C8-T1 Nml Nml Nml Nml Nml 0 Nml Nml   Right PronatorTeres Median C6-7 Nml Nml Nml Nml Nml 0 Nml Nml   Right Biceps Musculocut C5-6 Nml Nml Nml Nml Nml 0 Nml Nml   Right Deltoid Axillary C5-6 Nml Nml Nml Nml Nml 0 Nml Nml     Nerve Conduction Studies Anti Sensory Left/Right Comparison   Stim Site L Lat (ms) R Lat (ms) L-R Lat (ms) L Amp (V) R Amp (V) L-R Amp (%) Site1 Site2 L Vel (m/s) R Vel (m/s) L-R Vel (m/s)  Median Acr Palm  Anti Sensory (2nd Digit)  31.1C  Wrist *5.1 *4.8 0.3 19.0 *3.0 84.2 Wrist Palm     Palm *4.2 *5.6 1.4 1.4 4.3 67.4       Radial Anti Sensory (Base 1st Digit)  31.1C  Wrist 2.3 2.5 0.2 32.3 21.0 35.0 Wrist Base 1st Digit     Ulnar Anti Sensory (5th Digit)  31.5C  Wrist *4.1 *4.0 0.1 22.5 *8.9 60.4 Wrist 5th Digit *34 *35 1   Motor Left/Right Comparison   Stim Site L Lat (ms) R Lat (ms) L-R Lat (ms) L Amp (mV) R Amp (mV) L-R Amp (%) Site1 Site2 L Vel (m/s) R Vel (m/s) L-R Vel (m/s)  Median Motor (Abd Poll Brev)  31.3C  Wrist *6.2 *6.5 0.3 *1.8 *0.1 *94.4 Elbow Wrist *46 *15 *31  Elbow 10.7 20.4 9.7 1.4 0.1 92.9       Ulnar Motor (Abd Dig Min)  31.5C  Wrist 3.2 3.7 0.5 7.3 6.6 9.6 B Elbow Wrist 58 54 4  B Elbow 6.5 7.1 0.6 6.1 6.4 4.7 A Elbow B Elbow 77 59 *18  A Elbow 7.8 8.8 1.0 7.5 6.5 13.3          Waveforms:                     Clinical History: No specialty comments available.   She reports that she has never smoked. She has never been exposed to tobacco smoke. She has never used smokeless tobacco.  Recent Labs    02/07/23 1149 08/08/23 1517  HGBA1C 6.2 5.5    Objective:  VS:  HT:    WT:   BMI:     BP:   HR: bpm  TEMP: ( )  RESP:  Physical Exam Vitals and nursing note reviewed.  Constitutional:      General: She is not in acute distress.    Appearance: Normal appearance. She is well-developed. She is not ill-appearing.  HENT:     Head: Normocephalic and atraumatic.  Eyes:     Conjunctiva/sclera: Conjunctivae normal.     Pupils: Pupils are equal, round, and reactive to light.  Cardiovascular:     Rate and Rhythm: Normal rate.     Pulses: Normal pulses.  Pulmonary:     Effort: Pulmonary effort is normal.  Musculoskeletal:        General: Tenderness present. No swelling or deformity.     Right lower leg: No edema.     Left lower leg: No edema.     Comments: Inspection reveals OA changes and flattening of the bilateral APB but no atrophy of the  bilateral FDI or hand intrinsics. There is no swelling, color changes, allodynia or dystrophic changes. There is 5 out of 5 strength in the bilateral wrist extension, finger abduction and long finger flexion.  There is impaired sensation to light touch in the right more than left median nerve distribution.  There is a negative Phalen's test bilaterally. There is a positive Hoffmann's test bilaterally.  Skin:    General: Skin is warm and dry.     Findings: No erythema or rash.  Neurological:     General: No focal deficit present.     Mental Status: She is alert and oriented to person, place, and time.     Cranial Nerves: No cranial nerve deficit.     Sensory: Sensory deficit present.     Motor: No weakness or abnormal muscle tone.     Coordination: Coordination normal.     Gait: Gait abnormal.  Psychiatric:        Mood and Affect: Mood normal.        Behavior: Behavior normal.     Ortho Exam  Imaging: No results found.  Past Medical/Family/Surgical/Social History: Medications & Allergies reviewed per EMR, new medications updated. Patient Active Problem List   Diagnosis Date Noted   Bilateral hand numbness 11/27/2023   Rheumatoid factor positive 11/27/2023   Generalized osteoarthritis of multiple sites 11/27/2023   Vasospasm (HCC) 03/13/2023   Chronic respiratory failure (HCC) 03/13/2023   Leg cramping 12/16/2022   B12 deficiency 01/22/2022   Zinc  deficiency 01/19/2022   Chronic diastolic heart failure (HCC) 01/19/2022   Severe pulmonary hypertension (HCC) 01/19/2022   Abnormal CT of the chest 12/01/2021   Anemia due to zinc  deficiency 11/02/2021   LVH (left ventricular hypertrophy) 10/29/2021   Deficiency anemia 10/29/2021   Iron deficiency anemia secondary to inadequate dietary iron intake 10/29/2021   Stage 3b chronic kidney disease (HCC) 10/29/2021   Acute cystitis with hematuria    NSTEMI (non-ST elevated myocardial infarction) [facility])    Esophageal dysphagia    Benign  neoplasm of ascending colon    Benign neoplasm of descending colon    Kyphoscoliosis 03/30/2021   GERD (gastroesophageal reflux disease) 01/11/2021   Vitamin D  deficiency 01/11/2021   Insomnia 09/11/2019   Murmur, cardiac 01/16/2018   Moderate persistent asthma 12/13/2017   Osteoporosis 01/07/2017   Diabetes mellitus without complication (HCC) 07/07/2016   History of breast cancer 01/06/2016   Intraductal papilloma of left breast 01/06/2016   Essential hypertension 06/08/2015   DOE (dyspnea on exertion) 06/08/2015   Fatigue 05/20/2015   Back pain 04/22/2015   Multinodular goiter 12/01/2011   Hypothyroidism    CAD (coronary artery disease)  Atrial septal aneurysm    Hyperlipidemia    Spinal stenosis, lumbar region, with neurogenic claudication 07/01/2011   Lumbar postlaminectomy syndrome 07/01/2011   Osteoarthritis of right hip 07/01/2011   Contracture of right hip 07/01/2011   Endometriosis    Depression 12/24/2009   Migraine headache 05/19/2006   Past Medical History:  Diagnosis Date   Anemia    hx   Arthritis    Asthma    PFTs, February, 2011, moderate obstructive disease with response to bronchodilators, normal lung volumes, moderate reduction in diffusing capacity   Atrial septal aneurysm    Echo, 2008-not noted on 13 echo   CAD (coronary artery disease)    90% distal LAD in the past  /   nuclear, 2008, no ischemia, ejection fraction 70%   Cancer (HCC) 2002   DUCTAL CIS--S/P LUMPECTOMY, RADIATION AND 6 WEEKS OF TAMOXIFEN   D-dimer, elevated    January, 2014   Depression    Ejection fraction    EF 60%, echo, October, 2008   Elevated CPK    January, 2014   Endometriosis 1989   RIGHT TUBE   Endometriosis 1987   LEFT TUBE/OVARY W FOCAL IN-SITU ENDOMETRIAL ADENOCARCINOMA   GERD (gastroesophageal reflux disease)    occ   H/O hiatal hernia    ?   High cholesterol    Hyperlipidemia    Hypertension    Hypothyroidism    Patient has had in the past that she  does not need treatment   Kyphoscoliosis    Obstructive airway disease (HCC)    Pinched nerve    lower back   Shortness of breath    Echo 2/22: EF 60-65, no RWMA, mild LVH, GR 1  DD, GLS -24%, normal RVSF, mild MR, trivial AI, borderline dilation of aortic root (39 mm)   UTI (lower urinary tract infection)    Family History  Problem Relation Age of Onset   Heart failure Mother    Pneumonia Mother    Hypertension Mother    Heart disease Mother    Stroke Maternal Grandfather    Hypertension Maternal Grandfather    Heart attack Father    Heart disease Father    Asthma Paternal Uncle        PAT UNCLES   Heart attack Paternal Uncle    Past Surgical History:  Procedure Laterality Date   ANKLE SURGERY Left    ligament   APPENDECTOMY  1987   AT [facility]   BACK SURGERY     Fusion   BREAST LUMPECTOMY  2003   radiation on right   BREAST LUMPECTOMY WITH RADIOACTIVE SEED LOCALIZATION Left 02/12/2016   Procedure: LEFT BREAST LUMPECTOMY WITH RADIOACTIVE SEED LOCALIZATION;  Surgeon: Morene Olives, MD;  Location: [facility];  Service: General;  Laterality: Left;   CARDIOVASCULAR STRESS TEST  09/07/05   Nuclear, was negative   CATARACT EXTRACTION Bilateral 01,03   COLONOSCOPY WITH PROPOFOL  N/A 05/04/2021   Procedure: COLONOSCOPY WITH PROPOFOL ;  Surgeon: Avram Lupita BRAVO, MD;  Location: [facility];  Service: Endoscopy;  Laterality: N/A;   ESOPHAGOGASTRODUODENOSCOPY (EGD) WITH PROPOFOL  N/A 05/04/2021   Procedure: ESOPHAGOGASTRODUODENOSCOPY (EGD) WITH PROPOFOL ;  Surgeon: Avram Lupita BRAVO, MD;  Location: [facility];  Service: Endoscopy;  Laterality: N/A;   OOPHORECTOMY     LSO -RSO   PELVIC LAPAROSCOPY  1989   RSO, LYSIS OF ADHESIONS   POLYPECTOMY  05/04/2021   Procedure: POLYPECTOMY;  Surgeon: Avram Lupita BRAVO, MD;  Location: [facility];  Service:  Endoscopy;;   RIGHT/LEFT HEART CATH AND CORONARY ANGIOGRAPHY N/A 03/28/2023   Procedure: RIGHT/LEFT HEART CATH AND CORONARY  ANGIOGRAPHY;  Surgeon: Rolan Ezra RAMAN, MD;  Location: [facility] INVASIVE CV LAB;  Service: Cardiovascular;  Laterality: N/A;   TOTAL ABDOMINAL HYSTERECTOMY  1987   LSO, APPENDECTOMY   TOTAL HIP ARTHROPLASTY Right 10/28/2013   dr barbarann   TOTAL HIP ARTHROPLASTY Right 10/28/2013   Procedure: TOTAL HIP ARTHROPLASTY ANTERIOR APPROACH;  Surgeon: Oneil JAYSON barbarann, MD;  Location: [facility] OR;  Service: Orthopedics;  Laterality: Right;  Right Total Hip Arthroplasty, Direct Anterior Approach   Social History   Occupational History   Occupation: RETIRED  Tobacco Use   Smoking status: Never    Passive exposure: Never   Smokeless tobacco: Never  Vaping Use   Vaping status: Never Used  Substance and Sexual Activity   Alcohol  use: No    Alcohol /week: 0.0 standard drinks of alcohol    Drug use: No   Sexual activity: Not Currently    Partners: Male    Birth control/protection: Surgical

## 2024-01-15 ENCOUNTER — Other Ambulatory Visit: Payer: Self-pay

## 2024-01-15 DIAGNOSIS — R200 Anesthesia of skin: Secondary | ICD-10-CM

## 2024-01-15 DIAGNOSIS — H34832 Tributary (branch) retinal vein occlusion, left eye, with macular edema: Secondary | ICD-10-CM | POA: Diagnosis not present

## 2024-01-15 DIAGNOSIS — R202 Paresthesia of skin: Secondary | ICD-10-CM

## 2024-01-15 NOTE — Progress Notes (Signed)
 Patient advised per Dr. Jeannetta: Nerve study showed severe CTS on both sides with muscle involvement on the right side. Usually surgery to decompress this would be the recommended next step or should at least be evaluated. We can refer to orthopedic surgery for this if she is okay with that plan.    Patient verbalized understanding and is in agreement with being referred to Ortho. Patient prefers IT trainer.   Please review and sign pended referral. Thanks!

## 2024-01-16 ENCOUNTER — Ambulatory Visit (HOSPITAL_COMMUNITY): Payer: Self-pay | Admitting: Cardiology

## 2024-01-16 ENCOUNTER — Ambulatory Visit (HOSPITAL_COMMUNITY)
Admission: RE | Admit: 2024-01-16 | Discharge: 2024-01-16 | Disposition: A | Discharge status: Home / Self Care | Source: Ambulatory Visit | Attending: Cardiology | Admitting: Cardiology

## 2024-01-16 DIAGNOSIS — I5032 Chronic diastolic (congestive) heart failure: Secondary | ICD-10-CM | POA: Insufficient documentation

## 2024-01-16 LAB — CBC
HCT: 41.1 % (ref 36.0–46.0)
Hemoglobin: 13.3 g/dL (ref 12.0–15.0)
MCH: 31.3 pg (ref 26.0–34.0)
MCHC: 32.4 g/dL (ref 30.0–36.0)
MCV: 96.7 fL (ref 80.0–100.0)
Platelets: 215 K/uL (ref 150–400)
RBC: 4.25 MIL/uL (ref 3.87–5.11)
RDW: 13.7 % (ref 11.5–15.5)
WBC: 5.7 K/uL (ref 4.0–10.5)
nRBC: 0 % (ref 0.0–0.2)

## 2024-01-16 LAB — DRUG TOX MONITOR 1 W/CONF, ORAL FLD
Amphetamines: NEGATIVE ng/mL (ref <10)
Barbiturates: NEGATIVE ng/mL (ref <10)
Benzodiazepines: NEGATIVE ng/mL (ref <0.50)
Buprenorphine: NEGATIVE ng/mL (ref <0.10)
Cocaine: NEGATIVE ng/mL (ref <5.0)
Dihydrocodeine: 18.6 ng/mL — ABNORMAL HIGH (ref <2.5)
Fentanyl: NEGATIVE ng/mL (ref <0.10)
Heroin Metabolite: NEGATIVE ng/mL (ref <1.0)
Hydrocodone: 150.5 ng/mL — ABNORMAL HIGH (ref <2.5)
Hydromorphone: NEGATIVE ng/mL (ref <2.5)
MARIJUANA: NEGATIVE ng/mL (ref <2.5)
MDMA: NEGATIVE ng/mL (ref <10)
Meprobamate: NEGATIVE ng/mL (ref <2.5)
Methadone: NEGATIVE ng/mL (ref <5.0)
Morphine: NEGATIVE ng/mL (ref <2.5)
Nicotine Metabolite: NEGATIVE ng/mL (ref <5.0)
Norhydrocodone: 6.6 ng/mL — ABNORMAL HIGH (ref <2.5)
Noroxycodone: NEGATIVE ng/mL (ref <2.5)
Opiates: POSITIVE ng/mL — AB (ref <2.5)
Oxycodone: NEGATIVE ng/mL (ref <2.5)
Oxymorphone: NEGATIVE ng/mL (ref <2.5)
Phencyclidine: NEGATIVE ng/mL (ref <10)
Tapentadol: NEGATIVE ng/mL (ref <5.0)
Tramadol: NEGATIVE ng/mL (ref <5.0)
Zolpidem: NEGATIVE ng/mL (ref <5.0)

## 2024-01-16 LAB — DRUG TOX ALC METAB W/CON, ORAL FLD: Alcohol Metabolite: NEGATIVE ng/mL (ref <25)

## 2024-01-22 ENCOUNTER — Encounter: Payer: Self-pay | Admitting: Internal Medicine

## 2024-01-23 ENCOUNTER — Other Ambulatory Visit: Payer: Self-pay

## 2024-01-23 MED ORDER — LEVOTHYROXINE SODIUM 100 MCG PO TABS: ORAL_TABLET | ORAL | 2 refills | Status: AC

## 2024-01-29 DIAGNOSIS — H34832 Tributary (branch) retinal vein occlusion, left eye, with macular edema: Secondary | ICD-10-CM | POA: Diagnosis not present

## 2024-01-30 ENCOUNTER — Telehealth (HOSPITAL_COMMUNITY): Payer: Self-pay | Admitting: Cardiology

## 2024-02-07 ENCOUNTER — Encounter (HOSPITAL_COMMUNITY): Admitting: Cardiology

## 2024-02-08 ENCOUNTER — Encounter: Attending: Registered Nurse | Admitting: Registered Nurse

## 2024-02-08 ENCOUNTER — Encounter: Payer: Self-pay | Admitting: Registered Nurse

## 2024-02-08 VITALS — BP 111/67 | HR 73 | Ht <= 58 in | Wt 125.6 lb

## 2024-02-08 DIAGNOSIS — M792 Neuralgia and neuritis, unspecified: Secondary | ICD-10-CM | POA: Diagnosis not present

## 2024-02-08 DIAGNOSIS — Z79891 Long term (current) use of opiate analgesic: Secondary | ICD-10-CM | POA: Diagnosis not present

## 2024-02-08 DIAGNOSIS — M48062 Spinal stenosis, lumbar region with neurogenic claudication: Secondary | ICD-10-CM | POA: Diagnosis not present

## 2024-02-08 DIAGNOSIS — M25562 Pain in left knee: Secondary | ICD-10-CM | POA: Diagnosis not present

## 2024-02-08 DIAGNOSIS — Z5181 Encounter for therapeutic drug level monitoring: Secondary | ICD-10-CM | POA: Insufficient documentation

## 2024-02-08 DIAGNOSIS — M25511 Pain in right shoulder: Secondary | ICD-10-CM | POA: Insufficient documentation

## 2024-02-08 DIAGNOSIS — M25561 Pain in right knee: Secondary | ICD-10-CM | POA: Diagnosis not present

## 2024-02-08 DIAGNOSIS — G8929 Other chronic pain: Secondary | ICD-10-CM | POA: Insufficient documentation

## 2024-02-08 DIAGNOSIS — G894 Chronic pain syndrome: Secondary | ICD-10-CM | POA: Insufficient documentation

## 2024-02-08 DIAGNOSIS — M24551 Contracture, right hip: Secondary | ICD-10-CM | POA: Insufficient documentation

## 2024-02-08 MED ORDER — HYDROCODONE-ACETAMINOPHEN 10-325 MG PO TABS: 1.0000 | ORAL_TABLET | Freq: Three times a day (TID) | ORAL | 0 refills | Status: DC | PRN

## 2024-02-08 NOTE — Progress Notes (Signed)
 Subjective:    Patient ID: Hannah Scott, female    DOB: 02/27/1951, 73 y.o.   MRN: [id number]  HPI: Hannah Scott is a 73 y.o. female who returns for follow up appointment for chronic pain and medication refill. She states her pain is located in her lower back, bilateral knees and bilateral feet with tingling and burning. Reports increase frequency of neuropathic pain, her Gabapentin  increased 100 mg in am, late afternoon and continue 600 mg at HS. She was instructed to call provider next week with update on medication change, she verbalizes understanding. . She  rates her pain 7. Her current exercise regime is walking with her walker short distances.   Hannah Scott Morphine  equivalent is 30.00 MME.   Last Oral Swab was Performed on 01/11/2024, it was consistent.    Pain Inventory Average Pain 7 Pain Right Now 7 My pain is constant, burning, tingling, and aching  In the last 24 hours, has pain interfered with the following? General activity 10 Relation with others 10 Enjoyment of life 10 What TIME of day is your pain at its worst? morning , daytime, evening, and night Sleep (in general) Poor  Pain is worse with: walking, standing, and some activites Pain improves with: rest and medication Relief from Meds: 6  Family History  Problem Relation Age of Onset   Heart failure Mother    Pneumonia Mother    Hypertension Mother    Heart disease Mother    Stroke Maternal Grandfather    Hypertension Maternal Grandfather    Heart attack Father    Heart disease Father    Asthma Paternal Uncle        PAT UNCLES   Heart attack Paternal Uncle    Social History   Socioeconomic History   Marital status: Widowed    Spouse name: Not on file   Number of children: Not on file   Years of education: Not on file   Highest education level: Not on file  Occupational History   Occupation: RETIRED  Tobacco Use   Smoking status: Never    Passive exposure: Never   Smokeless tobacco: Never  Vaping Use    Vaping status: Never Used  Substance and Sexual Activity   Alcohol  use: No    Alcohol /week: 0.0 standard drinks of alcohol    Drug use: No   Sexual activity: Not Currently    Partners: Male    Birth control/protection: Surgical  Other Topics Concern   Not on file  Social History Narrative   Widowed in 2015   2 sons 1 lives in the area the other is in close contact /2025   1 grandchild   Never smoker no drug use no alcohol    Social Drivers of Corporate investment banker Strain: Medium Risk (08/01/2023)   Overall Financial Resource Strain (CARDIA)    Difficulty of Paying Living Expenses: Somewhat hard  Food Insecurity: No Food Insecurity (08/01/2023)   Hunger Vital Sign    Worried About Running Out of Food in the Last Year: Never true    Ran Out of Food in the Last Year: Never true  Transportation Needs: No Transportation Needs (08/01/2023)   PRAPARE - Administrator, Civil Service (Medical): No    Lack of Transportation (Non-Medical): No  Physical Activity: Inactive (08/01/2023)   Exercise Vital Sign    Days of Exercise per Week: 0 days    Minutes of Exercise per Session: 0 min  Stress: Stress Concern  Present (08/01/2023)   [employer] of Occupational Health - Occupational Stress Questionnaire    Feeling of Stress : To some extent  Social Connections: Socially Isolated (08/01/2023)   Social Connection and Isolation Panel    Frequency of Communication with Friends and Family: More than three times a week    Frequency of Social Gatherings with Friends and Family: Once a week    Attends Religious Services: Never    Database administrator or Organizations: No    Attends Banker Meetings: Never    Marital Status: Widowed   Past Surgical History:  Procedure Laterality Date   ANKLE SURGERY Left    ligament   APPENDECTOMY  1987   AT [facility]   BACK SURGERY     Fusion   BREAST LUMPECTOMY  2003   radiation on right   BREAST LUMPECTOMY WITH RADIOACTIVE SEED  LOCALIZATION Left 02/12/2016   Procedure: LEFT BREAST LUMPECTOMY WITH RADIOACTIVE SEED LOCALIZATION;  Surgeon: Morene Olives, MD;  Location: [facility];  Service: General;  Laterality: Left;   CARDIOVASCULAR STRESS TEST  09/07/05   Nuclear, was negative   CATARACT EXTRACTION Bilateral 01,03   COLONOSCOPY WITH PROPOFOL  N/A 05/04/2021   Procedure: COLONOSCOPY WITH PROPOFOL ;  Surgeon: Avram Lupita BRAVO, MD;  Location: [facility];  Service: Endoscopy;  Laterality: N/A;   ESOPHAGOGASTRODUODENOSCOPY (EGD) WITH PROPOFOL  N/A 05/04/2021   Procedure: ESOPHAGOGASTRODUODENOSCOPY (EGD) WITH PROPOFOL ;  Surgeon: Avram Lupita BRAVO, MD;  Location: [facility];  Service: Endoscopy;  Laterality: N/A;   OOPHORECTOMY     LSO -RSO   PELVIC LAPAROSCOPY  1989   RSO, LYSIS OF ADHESIONS   POLYPECTOMY  05/04/2021   Procedure: POLYPECTOMY;  Surgeon: Avram Lupita BRAVO, MD;  Location: [facility];  Service: Endoscopy;;   RIGHT/LEFT HEART CATH AND CORONARY ANGIOGRAPHY N/A 03/28/2023   Procedure: RIGHT/LEFT HEART CATH AND CORONARY ANGIOGRAPHY;  Surgeon: Rolan Ezra RAMAN, MD;  Location: [facility] IN[facility] CV LAB;  Service: Cardiovascular;  Laterality: N/A;   TOTAL ABDOMINAL HYSTERECTOMY  1987   LSO, APPENDECTOMY   TOTAL HIP ARTHROPLASTY Right 10/28/2013   dr barbarann   TOTAL HIP ARTHROPLASTY Right 10/28/2013   Procedure: TOTAL HIP ARTHROPLASTY ANTERIOR APPROACH;  Surgeon: Oneil JAYSON barbarann, MD;  Location: [facility];  Service: Orthopedics;  Laterality: Right;  Right Total Hip Arthroplasty, Direct Anterior Approach   Past Surgical History:  Procedure Laterality Date   ANKLE SURGERY Left    ligament   APPENDECTOMY  1987   AT [facility]   BACK SURGERY     Fusion   BREAST LUMPECTOMY  2003   radiation on right   BREAST LUMPECTOMY WITH RADIOACTIVE SEED LOCALIZATION Left 02/12/2016   Procedure: LEFT BREAST LUMPECTOMY WITH RADIOACTIVE SEED LOCALIZATION;  Surgeon: Morene Olives, MD;  Location: [facility];  Service:  General;  Laterality: Left;   CARDIOVASCULAR STRESS TEST  09/07/05   Nuclear, was negative   CATARACT EXTRACTION Bilateral 01,03   COLONOSCOPY WITH PROPOFOL  N/A 05/04/2021   Procedure: COLONOSCOPY WITH PROPOFOL ;  Surgeon: Avram Lupita BRAVO, MD;  Location: [facility];  Service: Endoscopy;  Laterality: N/A;   ESOPHAGOGASTRODUODENOSCOPY (EGD) WITH PROPOFOL  N/A 05/04/2021   Procedure: ESOPHAGOGASTRODUODENOSCOPY (EGD) WITH PROPOFOL ;  Surgeon: Avram Lupita BRAVO, MD;  Location: [facility];  Service: Endoscopy;  Laterality: N/A;   OOPHORECTOMY     LSO -RSO   PELVIC LAPAROSCOPY  1989   RSO, LYSIS OF ADHESIONS   POLYPECTOMY  05/04/2021   Procedure: POLYPECTOMY;  Surgeon: Avram Lupita BRAVO,  MD;  Location: [facility];  Service: Endoscopy;;   RIGHT/LEFT HEART CATH AND CORONARY ANGIOGRAPHY N/A 03/28/2023   Procedure: RIGHT/LEFT HEART CATH AND CORONARY ANGIOGRAPHY;  Surgeon: Rolan Ezra RAMAN, MD;  Location: [facility] IN[facility]ASIVE CV LAB;  Service: Cardiovascular;  Laterality: N/A;   TOTAL ABDOMINAL HYSTERECTOMY  1987   LSO, APPENDECTOMY   TOTAL HIP ARTHROPLASTY Right 10/28/2013   dr barbarann   TOTAL HIP ARTHROPLASTY Right 10/28/2013   Procedure: TOTAL HIP ARTHROPLASTY ANTERIOR APPROACH;  Surgeon: Oneil JAYSON barbarann, MD;  Location: [facility];  Service: Orthopedics;  Laterality: Right;  Right Total Hip Arthroplasty, Direct Anterior Approach   Past Medical History:  Diagnosis Date   Anemia    hx   Arthritis    Asthma    PFTs, February, 2011, moderate obstructive disease with response to bronchodilators, normal lung volumes, moderate reduction in diffusing capacity   Atrial septal aneurysm    Echo, 2008-not noted on 13 echo   CAD (coronary artery disease)    90% distal LAD in the past  /   nuclear, 2008, no ischemia, ejection fraction 70%   Cancer (HCC) 2002   DUCTAL CIS--S/P LUMPECTOMY, RADIATION AND 6 WEEKS OF TAMOXIFEN   D-dimer, elevated    January, 2014   Depression    Ejection fraction    EF 60%, echo, October, 2008    Elevated CPK    January, 2014   Endometriosis 1989   RIGHT TUBE   Endometriosis 1987   LEFT TUBE/OVARY W FOCAL IN-SITU ENDOMETRIAL ADENOCARCINOMA   GERD (gastroesophageal reflux disease)    occ   H/O hiatal hernia    ?   High cholesterol    Hyperlipidemia    Hypertension    Hypothyroidism    Patient has had in the past that she does not need treatment   Kyphoscoliosis    Obstructive airway disease (HCC)    Pinched nerve    lower back   Shortness of breath    Echo 2/22: EF 60-65, no RWMA, mild LVH, GR 1  DD, GLS -24%, normal RVSF, mild MR, trivial AI, borderline dilation of aortic root (39 mm)   UTI (lower urinary tract infection)    BP 111/67 (BP Location: Left Arm, Patient Position: Sitting, Cuff Size: Normal)   Pulse 73   Ht 4' 9 (1.448 m)   Wt 125 lb 9.6 oz (57 kg)   SpO2 93%   BMI 27.18 kg/m   Opioid Risk Score:   Fall Risk Score:  `1  Depression screen [facility] 2/9     01/11/2024    3:25 PM 10/02/2023    2:05 PM 08/24/2023    2:14 PM 08/01/2023    3:51 PM 06/20/2023    1:37 PM 04/14/2023   10:53 AM 03/16/2023    2:16 PM  Depression screen PHQ 2/9  Decreased Interest 0 0 1 2 0 0 0  Down, Depressed, Hopeless 0 0 1 3 0 0 0  PHQ - 2 Score 0 0 2 5 0 0 0  Altered sleeping    1     Tired, decreased energy    1     Change in appetite    0     Feeling bad or failure about yourself     3     Trouble concentrating    1     Moving slowly or fidgety/restless    0     Suicidal thoughts    0     PHQ-9 Score  11     Difficult doing work/chores    Somewhat difficult         Review of Systems  Musculoskeletal:  Positive for arthralgias, back pain and joint swelling.       Low back pain, bilateral knee pain, right wrist and hand pain  All other systems reviewed and are negative.      Objective:   Physical Exam Constitutional:      Appearance: Normal appearance. She is obese.  Cardiovascular:     Rate and Rhythm: Normal rate and regular rhythm.     Pulses: Normal  pulses.     Heart sounds: Normal heart sounds.  Pulmonary:     Effort: Pulmonary effort is normal.     Breath sounds: Normal breath sounds.  Musculoskeletal:     Comments: Normal Muscle Bulk and Muscle Testing Reveals:  Upper Extremities: Full ROM and Muscle Strength 5/5 Lumbar Paraspinal Tenderness: L-4-L-5 Right Greater Trochanter tenderness Lower Extremities: Full ROM and Muscle Strength 5/5 Arises from table slowly using walker for support Antalgic  Gait     Skin:    General: Skin is warm and dry.  Neurological:     Mental Status: She is alert and oriented to person, place, and time.  Psychiatric:        Mood and Affect: Mood normal.        Behavior: Behavior normal.          Assessment & Plan:  1. Lumbar post laminectomy syndrome with severe kyphoscoliosis thoracolumbar spine, s/p lumbar fusion. 02/08/2024 Continue current medication regimen. Refilled:  Hydrocodone  10/325mg  one tablet every 8 hours #90. We will continue the opioid monitoring program, this consists of regular clinic visits, examinations, urine drug screen, pill counts as well as use of [place]  Controlled Substance Reporting system. A 12 month History has been reviewed on the [place]  Controlled Substance Reporting System on 12/07/2023 2. Right Hip OA: S/P Right Hip Replacement 10/28/2013. 10/16/ 2025 3.Depression: Continue Cymbalta . Has Family Support and Friends.  Counseling with Juliene. 02/08/2024 4. Bilateral Hip Pain/ Right Greater Trochanteric Tenderness: No complaints today. Continue to Monitor. Continue with Ice and Heat Therapy. 02/08/2024 5. Poor Appetite/ Frail: No complaints today. Ms. Alatorre states PCP following. Continue to monitor. 02/08/2024 6. Chronic Bilateral  Knee Pain: Continue HEP as Tolerated. Continue to Monitor. 02/08/2024 7. Chronic Right  shoulder pain:  Continue HEP as tolerated. Continue to Monitor. 02/08/2024  8. Neuropathic Pain: Increase  Gabapentin  100 mg BID,  continue Gabapentin  600 mg at HS. She will call office next week with update on medication change. Continue to Monitor. 02/08/2024   F/U in 1 month

## 2024-02-09 ENCOUNTER — Encounter: Admitting: Internal Medicine

## 2024-02-13 ENCOUNTER — Other Ambulatory Visit: Payer: Self-pay | Admitting: Internal Medicine

## 2024-02-14 ENCOUNTER — Telehealth: Payer: Self-pay

## 2024-02-14 NOTE — Telephone Encounter (Signed)
 Gabapentin  dose is helping a little. Will continue on it.

## 2024-02-16 ENCOUNTER — Telehealth (HOSPITAL_COMMUNITY): Payer: Self-pay | Admitting: Cardiology

## 2024-02-16 NOTE — Telephone Encounter (Signed)
 Called to confirm/remind patient of their appointment at [facility] on 02/16/24.   Appointment:   [] Confirmed  [x] Left mess   [] No answer/No voice mail  [] VM Full/unable to leave message  [] Phone not in service  Patient reminded to bring all medications and/or complete list.  Confirmed patient has transportation. Gave directions, instructed to utilize valet parking.

## 2024-02-19 ENCOUNTER — Ambulatory Visit (HOSPITAL_COMMUNITY): Payer: Self-pay | Admitting: Cardiology

## 2024-02-19 ENCOUNTER — Ambulatory Visit (HOSPITAL_COMMUNITY)
Admission: RE | Admit: 2024-02-19 | Discharge: 2024-02-19 | Disposition: A | Discharge status: Home / Self Care | Source: Ambulatory Visit | Attending: Cardiology | Admitting: Cardiology

## 2024-02-19 ENCOUNTER — Other Ambulatory Visit (HOSPITAL_COMMUNITY): Payer: Self-pay

## 2024-02-19 DIAGNOSIS — M069 Rheumatoid arthritis, unspecified: Secondary | ICD-10-CM | POA: Insufficient documentation

## 2024-02-19 DIAGNOSIS — R197 Diarrhea, unspecified: Secondary | ICD-10-CM | POA: Insufficient documentation

## 2024-02-19 DIAGNOSIS — I119 Hypertensive heart disease without heart failure: Secondary | ICD-10-CM | POA: Diagnosis not present

## 2024-02-19 DIAGNOSIS — I2510 Atherosclerotic heart disease of native coronary artery without angina pectoris: Secondary | ICD-10-CM | POA: Diagnosis not present

## 2024-02-19 DIAGNOSIS — Z79890 Hormone replacement therapy (postmenopausal): Secondary | ICD-10-CM | POA: Insufficient documentation

## 2024-02-19 DIAGNOSIS — R109 Unspecified abdominal pain: Secondary | ICD-10-CM | POA: Insufficient documentation

## 2024-02-19 DIAGNOSIS — Z7951 Long term (current) use of inhaled steroids: Secondary | ICD-10-CM | POA: Diagnosis not present

## 2024-02-19 DIAGNOSIS — J45909 Unspecified asthma, uncomplicated: Secondary | ICD-10-CM | POA: Insufficient documentation

## 2024-02-19 DIAGNOSIS — I252 Old myocardial infarction: Secondary | ICD-10-CM | POA: Diagnosis not present

## 2024-02-19 DIAGNOSIS — I7300 Raynaud's syndrome without gangrene: Secondary | ICD-10-CM | POA: Insufficient documentation

## 2024-02-19 DIAGNOSIS — E2749 Other adrenocortical insufficiency: Secondary | ICD-10-CM | POA: Diagnosis not present

## 2024-02-19 DIAGNOSIS — I5032 Chronic diastolic (congestive) heart failure: Secondary | ICD-10-CM | POA: Diagnosis not present

## 2024-02-19 DIAGNOSIS — I272 Pulmonary hypertension, unspecified: Secondary | ICD-10-CM | POA: Diagnosis not present

## 2024-02-19 DIAGNOSIS — Z79899 Other long term (current) drug therapy: Secondary | ICD-10-CM | POA: Insufficient documentation

## 2024-02-19 DIAGNOSIS — M25541 Pain in joints of right hand: Secondary | ICD-10-CM | POA: Diagnosis not present

## 2024-02-19 DIAGNOSIS — M25542 Pain in joints of left hand: Secondary | ICD-10-CM | POA: Diagnosis not present

## 2024-02-19 DIAGNOSIS — E785 Hyperlipidemia, unspecified: Secondary | ICD-10-CM | POA: Insufficient documentation

## 2024-02-19 DIAGNOSIS — Z7982 Long term (current) use of aspirin: Secondary | ICD-10-CM | POA: Insufficient documentation

## 2024-02-19 DIAGNOSIS — I2721 Secondary pulmonary arterial hypertension: Secondary | ICD-10-CM | POA: Insufficient documentation

## 2024-02-19 DIAGNOSIS — M40209 Unspecified kyphosis, site unspecified: Secondary | ICD-10-CM | POA: Insufficient documentation

## 2024-02-19 DIAGNOSIS — I519 Heart disease, unspecified: Secondary | ICD-10-CM | POA: Diagnosis not present

## 2024-02-19 DIAGNOSIS — Z9981 Dependence on supplemental oxygen: Secondary | ICD-10-CM | POA: Diagnosis not present

## 2024-02-19 DIAGNOSIS — I491 Atrial premature depolarization: Secondary | ICD-10-CM | POA: Insufficient documentation

## 2024-02-19 LAB — BASIC METABOLIC PANEL WITH GFR
Anion gap: 10 (ref 5–15)
BUN: 20 mg/dL (ref 8–23)
CO2: 30 mmol/L (ref 22–32)
Calcium: 9.2 mg/dL (ref 8.9–10.3)
Chloride: 100 mmol/L (ref 98–111)
Creatinine, Ser: 0.69 mg/dL (ref 0.44–1.00)
GFR, Estimated: >60 mL/min (ref >60)
Glucose, Bld: 92 mg/dL (ref 70–99)
Potassium: 5.2 mmol/L — ABNORMAL HIGH (ref 3.5–5.1)
Sodium: 140 mmol/L (ref 135–145)

## 2024-02-19 LAB — CBC
HCT: 43.7 % (ref 36.0–46.0)
Hemoglobin: 14.1 g/dL (ref 12.0–15.0)
MCH: 31.1 pg (ref 26.0–34.0)
MCHC: 32.3 g/dL (ref 30.0–36.0)
MCV: 96.5 fL (ref 80.0–100.0)
Platelets: 203 K/uL (ref 150–400)
RBC: 4.53 MIL/uL (ref 3.87–5.11)
RDW: 14.8 % (ref 11.5–15.5)
WBC: 5.6 K/uL (ref 4.0–10.5)
nRBC: 0 % (ref 0.0–0.2)

## 2024-02-19 LAB — TSH: TSH: 1.681 u[IU]/mL (ref 0.350–4.500)

## 2024-02-19 LAB — BRAIN NATRIURETIC PEPTIDE: B Natriuretic Peptide: 56 pg/mL (ref 0.0–100.0)

## 2024-02-19 NOTE — H&P (View-Only) (Signed)
 PCP: Geofm Glade PARAS, MD HF Cardiology: Dr. Rolan  Chief complaint: Pulmonary hypertension  73 y.o. with history of pulmonary hypertension returns for followup of PH and RV failure.  Patient has a history of rheumatoid arthritis though not currently treated, kyphoscoliosis, prior breast cancer, NSTEMI in 1991 and 1998 thought to be due to vasospasm, and asthma since childhood. She had an echo in 12/23 showing normal LV EF 60-65%, mild RV enlargement with normal RV systolic function, PASP 48 mmHg.  She has joint pain in her hands and has Raynaud's phenomenon.  She is not currently on treatment for RA.  She has had a long history of asthma, sees Dr. Shelah for this. She wears oxygen  at night, rarely during the day.   She had LHC/RHC in 12/24, this showed no significant CAD and normal filling pressures but CI was mildly low at 2.1 and there was severe pulmonary arterial hypertension with PVR 10.4 WU.   Echo in 12/24 showed EF 70-75%, moderate LVH, D-shaped septum, mild RV dysfunction with mildly dilated RV, PASP 73 mmHg, mild-moderate MR, IVC dilated.   V/Q scan in 3/25 showed no evidence for chronic PE.  High resolution CT chest in 3/25 showed no interstitial lung disease but cannot exclude bronchiolitis obliterans, also noted was a stable calcified right adrenal mass.   She is now on Opsumit  and tadalafil  20 mg daily.  She stopped Jardiance  due to lightheadedness. She tried Uptravi  but stopped it due to intractable abdominal pain and diarrhea even with the lowest dose.   Using 2L home oxygen  at night and with exertion.  Using a walker for stability.  She does not feel any better since starting sotatercept. She gets short of breath vacuuming, bathing.  Generally ok dressing.  Mild dyspnea with walking more than just around the house. No chest pain.  No lightheadedness. Weight stable. No orthopnea/PND.  BP high in the office today but SBP runs 100s-110s at home.   ECG (personally reviewed): NSR,  normal  Labs (10/24): LDL 56 Labs (11/24): pro-BNP 1389, hgb 13.8, K 4.2, creatinine 0.93 Labs (12/24): K 3.9, creatinine 0.86, anti-SCL 70 negative, anti-centromere negative, RF 396, CCP > 250.  Labs (4/25): hgb 11.8, K 3.8, creatinine 0.68, LFTs normal, LDL 57, BNP 145 Labs (7/25): BNP 149, K 4.3, creatinine 0.88 Labs (9/25): 13.3  6 minute walk (1/25): 152 m  6 minute walk (4/25): 243 m 6 minute walk (10/25): 213 m  PMH: 1. Hypothyroidism 2. CAD: NSTEMI thought to be related to vasospasm in 1991 and 1998.  - LHC (12/24): Normal coronaries.  3. HTN: Orthostatic hypotension with amlodipine .  4. Hyperlipidemia 5. Atrial septal aneurysm 6. Asthma: Since childhood, sees Dr. Shelah 7. Breast cancer: S/p lumpectomy, radiation.  8. Rheumatoid arthritis: Not on DMARD.  9. Raynaud's phenomenon.  10. Pulmonary hypertension: Echo (12/23) with EF 60-65%, mild RV enlargement with normal RV systolic function, severe RAE, PASP 48 mmHg.  - RHC (12/24): mean RA 8, PA 78/25 mean 46, mean PCWP 14, CI 2.1, PVR 10.4 WU, PAPi 6.6.  - Echo (12/24): EF 70-75%, moderate LVH, D-shaped septum, mild RV dysfunction with mildly dilated RV, PASP 73 mmHg, mild-moderate MR, IVC dilated.  - V/Q scan (3/25): No chronic PE.  - High resolution CT chest (3/25): No interstitial lung disease but cannot exclude bronchiolitis obliterans, also noted was a stable calcified right adrenal mass.  11. Kyphoscoliosis 12. Right THR  SH: Widow, lives alone in [place], 2 children locally. Nonsmoker. No ETOH.  Family History  Problem Relation Age of Onset   Heart failure Mother    Pneumonia Mother    Hypertension Mother    Heart disease Mother    Stroke Maternal Grandfather    Hypertension Maternal Grandfather    Heart attack Father    Heart disease Father    Asthma Paternal Uncle        PAT UNCLES   Heart attack Paternal Uncle    ROS: All systems reviewed and negative except as per HPI.   Current Outpatient  Medications  Medication Sig Dispense Refill   albuterol  (VENTOLIN  HFA) 108 (90 Base) MCG/ACT inhaler Inhale 1-2 puffs into the lungs every 4 (four) hours as needed for wheezing or shortness of breath. 1 each 3   aspirin  EC 81 MG tablet Take 1 tablet (81 mg total) by mouth daily. Swallow whole. 90 tablet 3   atorvastatin  (LIPITOR) 20 MG tablet Take 1 tablet (20 mg total) by mouth daily. 90 tablet 3   bisoprolol  (ZEBETA ) 5 MG tablet TAKE 1 TABLET BY MOUTH DAILY 90 tablet 1   BREO ELLIPTA  100-25 MCG/ACT AEPB INHALE 1 PUFF INTO THE LUNGS DAILY 60 each 11   clobetasol  cream (TEMOVATE ) 0.05 % Apply 1 Application topically as needed.     DULoxetine  (CYMBALTA ) 30 MG capsule TAKE ONE CAPSULE EVERY DAY IN ADDITION TO 60MG  CAPSULE FOR A TOTAL OF 90MG  DAILY 90 capsule 1   DULoxetine  (CYMBALTA ) 60 MG capsule TAKE ONE CAPSULE BY MOUTH EVERY DAY TAKE ALONG WITH 30MG  CAPSULE. TOTAL DAILY DOSE is 90MG . 90 capsule 1   Ensure (ENSURE) Take 237 mLs by mouth.     famotidine  (PEPCID ) 20 MG tablet TAKE ONE TABLET BY MOUTH EVERY DAY AFTER SUPPER 90 tablet 2   furosemide  (LASIX ) 20 MG tablet Take 2 tablets (40 mg total) by mouth daily. 180 tablet 3   gabapentin  (NEURONTIN ) 100 MG capsule Take 1 capsule (100 mg total) by mouth in the morning. 30 capsule 1   gabapentin  (NEURONTIN ) 600 MG tablet TAKE ONE TABLET BY MOUTH EVERY DAY AT BEDTIME 90 tablet 1   HYDROcodone -acetaminophen  (NORCO) 10-325 MG tablet Take 1 tablet by mouth 3 (three) times daily as needed. Do Not Fill Before 02/14/2024 90 tablet 0   ipratropium-albuterol  (DUONEB) 0.5-2.5 (3) MG/3ML SOLN Take 3 mLs by nebulization every 6 (six) hours as needed (wheezing or SOB). During exacerbation 120 mL 3   levothyroxine  (SYNTHROID ) 100 MCG tablet TAKE 1 TABLET(100 MCG) BY MOUTH DAILY 90 tablet 2   macitentan  (OPSUMIT ) 10 MG tablet Take 1 tablet (10 mg total) by mouth daily.     tadalafil , PAH, (ADCIRCA ) 20 MG tablet Take 2 tablets (40 mg total) by mouth daily. 60 tablet  11   UNABLE TO FIND Med Name: Winrevair subcutaneous every 21 days.     Selexipag  (UPTRAVI  TITRATION) 200 & 800 MCG TBPK Take 200 mcg by mouth twice daily. Increase as tolerated to target dose of 1600 mcg BID. (Patient not taking: Reported on 02/19/2024)     No current facility-administered medications for this encounter.   BP 154/78, HR 69, wt 127 lbs General: Frail, kyphotic.  Neck: No JVD, no thyromegaly or thyroid  nodule.  Lungs: Clear to auscultation bilaterally with normal respiratory effort. CV: Nondisplaced PMI.  Heart regular S1/S2, no S3/S4, no murmur.  No peripheral edema.  No carotid bruit.  Normal pedal pulses.  Abdomen: Soft, nontender, no hepatosplenomegaly, no distention.  Skin: Intact without lesions or rashes.  Neurologic: Alert and oriented  x 3.  Psych: Normal affect. Extremities: No clubbing or cyanosis.  HEENT: Normal.   Assessment/Plan: 1. Pulmonary hypertension/RV failure: Echo in 12/24 with  EF 70-75%, moderate LVH, D-shaped septum, mild RV dysfunction with mildly dilated RV, PASP 73 mmHg, mild-moderate MR, IVC dilated. RHC in 12/24 with normal filling pressures, CI 2.1, and severe PAH with mean PA pressure 46 and PVR 10.4 WU.  V/Q scan in 3/25 with no evidence for chronic PE, high resolution CT chest in 3/25 with no evidence for interstitial lung disease.  I suspect group 1 PH related to rheumatologic disease => she has rheumatoid arthritis.  NYHA class III, 6 minute walk mildly less than before.  She does not feel like addition of sotatercept as helped.  She is not volume overloaded on exam today.  She did not tolerate even lowest dose of Uptravi .  - Continue Opsumit  10 mg daily.  - Continue tadalafil  40 mg daily.   - Continue sotatercept, check CBC today.  - Did not tolerate even the lowest dose of Uptravi  due to diarrhea and abdominal pain,.  - I am not sure if her dyspnea/fatigue is due to worsening/unimproved pulmonary hypertension or lung restriction from  kyphosis.  I will arrange for repeat RHC to reassess PA pressure and filling pressures.  If PA pressure remains significantly elevated, would recommend trial of inhaled Tyvaso to see if she could tolerate this better than Uptravi . We discussed risks/benefits and she agrees to procedure.  - Continue Lasix  40 mg daily, BMET/BNP today - She did not tolerate Jardiance .  - Echo in 12/25.  2. Rheumatoid arthritis:  She is not on disease-specific meds currently.  She also has Raynaud's phenomenon.  RF and CCP elevated. - Follows with rheumatology.   3. CAD: Patient had NSTEMIs in 1991 and 1998, per her report thought to be due to vasospasm.  LHC in 12/24 showed no significant coronary disease.  - Continue ASA 81  - Continue statin, good lipids in 4/25.  4. Hyperlipidemia: She is on atorvastatin , good lipids in 4/25.   5. Kyphoscoliosis: This likely contributed to her dyspnea via chest wall restriction.  6. HTN: BP controlled.  7. Abnormal CT chest: Cannot rule out bronchiolitis obliterans per report though no ILD.  Also with calcified right adrenal mass.  - Needs dedicated adrenal CT (has been ordered).  - Followup with pulmonary.   Followup 3 months.   I spent 41 minutes reviewing data, interviewing patient, and organizing the orders/followup.   Ezra Shuck 02/19/2024

## 2024-02-19 NOTE — Patient Instructions (Signed)
 There has been no changes to your medications.  Labs done today, your results will be available in MyChart, we will contact you for abnormal readings.  You are scheduled for a Cardiac Catheterization on Thursday, November 13 with Dr. Ezra Shuck.  1. Please arrive at the [facility] (Main Entrance A) at [facility]: [address] [place], [place] [address] at 7:00 AM (This time is 2 hour(s) before your procedure to ensure your preparation).   Free valet parking service is available. You will check in at ADMITTING. The support person will be asked to wait in the waiting room.  It is OK to have someone drop you off and come back when you are ready to be discharged.    Special note: Every effort is made to have your procedure done on time. Please understand that emergencies sometimes delay scheduled procedures.  2. Diet: Nothing to eat after midnight.   3.Medication instructions in preparation for your procedure:   Contrast Allergy: No  HOLD YOUR LASIX  THE MORNING OF YOUR PROCEDURE.  On the morning of your procedure, take any morning medicines NOT listed above.  You may use sips of water .  6. Plan to go home the same day, you will only stay overnight if medically necessary. 7. Bring a current list of your medications and current insurance cards. 8. You MUST have a responsible person to drive you home. 9. Someone MUST be with you the first 24 hours after you arrive home or your discharge will be delayed. 10. Please wear clothes that are easy to get on and off and wear slip-on shoes.  Your physician recommends that you schedule a follow-up appointment in: AS SCHEDULED

## 2024-02-19 NOTE — Progress Notes (Signed)
 6 Min Walk Test Completed  Pt ambulated 731ft (213.28m) O2 Sat ranged 99-85 on 3L oxygen  HR ranged 95-60

## 2024-02-19 NOTE — Progress Notes (Signed)
 PCP: Geofm Glade PARAS, MD HF Cardiology: Dr. Rolan  Chief complaint: Pulmonary hypertension  73 y.o. with history of pulmonary hypertension returns for followup of PH and RV failure.  Patient has a history of rheumatoid arthritis though not currently treated, kyphoscoliosis, prior breast cancer, NSTEMI in 1991 and 1998 thought to be due to vasospasm, and asthma since childhood. She had an echo in 12/23 showing normal LV EF 60-65%, mild RV enlargement with normal RV systolic function, PASP 48 mmHg.  She has joint pain in her hands and has Raynaud's phenomenon.  She is not currently on treatment for RA.  She has had a long history of asthma, sees Dr. Shelah for this. She wears oxygen  at night, rarely during the day.   She had LHC/RHC in 12/24, this showed no significant CAD and normal filling pressures but CI was mildly low at 2.1 and there was severe pulmonary arterial hypertension with PVR 10.4 WU.   Echo in 12/24 showed EF 70-75%, moderate LVH, D-shaped septum, mild RV dysfunction with mildly dilated RV, PASP 73 mmHg, mild-moderate MR, IVC dilated.   V/Q scan in 3/25 showed no evidence for chronic PE.  High resolution CT chest in 3/25 showed no interstitial lung disease but cannot exclude bronchiolitis obliterans, also noted was a stable calcified right adrenal mass.   She is now on Opsumit  and tadalafil  20 mg daily.  She stopped Jardiance  due to lightheadedness. She tried Uptravi  but stopped it due to intractable abdominal pain and diarrhea even with the lowest dose.   Using 2L home oxygen  at night and with exertion.  Using a walker for stability.  She does not feel any better since starting sotatercept. She gets short of breath vacuuming, bathing.  Generally ok dressing.  Mild dyspnea with walking more than just around the house. No chest pain.  No lightheadedness. Weight stable. No orthopnea/PND.  BP high in the office today but SBP runs 100s-110s at home.   ECG (personally reviewed): NSR,  normal  Labs (10/24): LDL 56 Labs (11/24): pro-BNP 1389, hgb 13.8, K 4.2, creatinine 0.93 Labs (12/24): K 3.9, creatinine 0.86, anti-SCL 70 negative, anti-centromere negative, RF 396, CCP > 250.  Labs (4/25): hgb 11.8, K 3.8, creatinine 0.68, LFTs normal, LDL 57, BNP 145 Labs (7/25): BNP 149, K 4.3, creatinine 0.88 Labs (9/25): 13.3  6 minute walk (1/25): 152 m  6 minute walk (4/25): 243 m 6 minute walk (10/25): 213 m  PMH: 1. Hypothyroidism 2. CAD: NSTEMI thought to be related to vasospasm in 1991 and 1998.  - LHC (12/24): Normal coronaries.  3. HTN: Orthostatic hypotension with amlodipine .  4. Hyperlipidemia 5. Atrial septal aneurysm 6. Asthma: Since childhood, sees Dr. Shelah 7. Breast cancer: S/p lumpectomy, radiation.  8. Rheumatoid arthritis: Not on DMARD.  9. Raynaud's phenomenon.  10. Pulmonary hypertension: Echo (12/23) with EF 60-65%, mild RV enlargement with normal RV systolic function, severe RAE, PASP 48 mmHg.  - RHC (12/24): mean RA 8, PA 78/25 mean 46, mean PCWP 14, CI 2.1, PVR 10.4 WU, PAPi 6.6.  - Echo (12/24): EF 70-75%, moderate LVH, D-shaped septum, mild RV dysfunction with mildly dilated RV, PASP 73 mmHg, mild-moderate MR, IVC dilated.  - V/Q scan (3/25): No chronic PE.  - High resolution CT chest (3/25): No interstitial lung disease but cannot exclude bronchiolitis obliterans, also noted was a stable calcified right adrenal mass.  11. Kyphoscoliosis 12. Right THR  SH: Widow, lives alone in [place], 2 children locally. Nonsmoker. No ETOH.  Family History  Problem Relation Age of Onset   Heart failure Mother    Pneumonia Mother    Hypertension Mother    Heart disease Mother    Stroke Maternal Grandfather    Hypertension Maternal Grandfather    Heart attack Father    Heart disease Father    Asthma Paternal Uncle        PAT UNCLES   Heart attack Paternal Uncle    ROS: All systems reviewed and negative except as per HPI.   Current Outpatient  Medications  Medication Sig Dispense Refill   albuterol  (VENTOLIN  HFA) 108 (90 Base) MCG/ACT inhaler Inhale 1-2 puffs into the lungs every 4 (four) hours as needed for wheezing or shortness of breath. 1 each 3   aspirin  EC 81 MG tablet Take 1 tablet (81 mg total) by mouth daily. Swallow whole. 90 tablet 3   atorvastatin  (LIPITOR) 20 MG tablet Take 1 tablet (20 mg total) by mouth daily. 90 tablet 3   bisoprolol  (ZEBETA ) 5 MG tablet TAKE 1 TABLET BY MOUTH DAILY 90 tablet 1   BREO ELLIPTA  100-25 MCG/ACT AEPB INHALE 1 PUFF INTO THE LUNGS DAILY 60 each 11   clobetasol  cream (TEMOVATE ) 0.05 % Apply 1 Application topically as needed.     DULoxetine  (CYMBALTA ) 30 MG capsule TAKE ONE CAPSULE EVERY DAY IN ADDITION TO 60MG  CAPSULE FOR A TOTAL OF 90MG  DAILY 90 capsule 1   DULoxetine  (CYMBALTA ) 60 MG capsule TAKE ONE CAPSULE BY MOUTH EVERY DAY TAKE ALONG WITH 30MG  CAPSULE. TOTAL DAILY DOSE is 90MG . 90 capsule 1   Ensure (ENSURE) Take 237 mLs by mouth.     famotidine  (PEPCID ) 20 MG tablet TAKE ONE TABLET BY MOUTH EVERY DAY AFTER SUPPER 90 tablet 2   furosemide  (LASIX ) 20 MG tablet Take 2 tablets (40 mg total) by mouth daily. 180 tablet 3   gabapentin  (NEURONTIN ) 100 MG capsule Take 1 capsule (100 mg total) by mouth in the morning. 30 capsule 1   gabapentin  (NEURONTIN ) 600 MG tablet TAKE ONE TABLET BY MOUTH EVERY DAY AT BEDTIME 90 tablet 1   HYDROcodone -acetaminophen  (NORCO) 10-325 MG tablet Take 1 tablet by mouth 3 (three) times daily as needed. Do Not Fill Before 02/14/2024 90 tablet 0   ipratropium-albuterol  (DUONEB) 0.5-2.5 (3) MG/3ML SOLN Take 3 mLs by nebulization every 6 (six) hours as needed (wheezing or SOB). During exacerbation 120 mL 3   levothyroxine  (SYNTHROID ) 100 MCG tablet TAKE 1 TABLET(100 MCG) BY MOUTH DAILY 90 tablet 2   macitentan  (OPSUMIT ) 10 MG tablet Take 1 tablet (10 mg total) by mouth daily.     tadalafil , PAH, (ADCIRCA ) 20 MG tablet Take 2 tablets (40 mg total) by mouth daily. 60 tablet  11   UNABLE TO FIND Med Name: Winrevair subcutaneous every 21 days.     Selexipag  (UPTRAVI  TITRATION) 200 & 800 MCG TBPK Take 200 mcg by mouth twice daily. Increase as tolerated to target dose of 1600 mcg BID. (Patient not taking: Reported on 02/19/2024)     No current facility-administered medications for this encounter.   BP 154/78, HR 69, wt 127 lbs General: Frail, kyphotic.  Neck: No JVD, no thyromegaly or thyroid  nodule.  Lungs: Clear to auscultation bilaterally with normal respiratory effort. CV: Nondisplaced PMI.  Heart regular S1/S2, no S3/S4, no murmur.  No peripheral edema.  No carotid bruit.  Normal pedal pulses.  Abdomen: Soft, nontender, no hepatosplenomegaly, no distention.  Skin: Intact without lesions or rashes.  Neurologic: Alert and oriented  x 3.  Psych: Normal affect. Extremities: No clubbing or cyanosis.  HEENT: Normal.   Assessment/Plan: 1. Pulmonary hypertension/RV failure: Echo in 12/24 with  EF 70-75%, moderate LVH, D-shaped septum, mild RV dysfunction with mildly dilated RV, PASP 73 mmHg, mild-moderate MR, IVC dilated. RHC in 12/24 with normal filling pressures, CI 2.1, and severe PAH with mean PA pressure 46 and PVR 10.4 WU.  V/Q scan in 3/25 with no evidence for chronic PE, high resolution CT chest in 3/25 with no evidence for interstitial lung disease.  I suspect group 1 PH related to rheumatologic disease => she has rheumatoid arthritis.  NYHA class III, 6 minute walk mildly less than before.  She does not feel like addition of sotatercept as helped.  She is not volume overloaded on exam today.  She did not tolerate even lowest dose of Uptravi .  - Continue Opsumit  10 mg daily.  - Continue tadalafil  40 mg daily.   - Continue sotatercept, check CBC today.  - Did not tolerate even the lowest dose of Uptravi  due to diarrhea and abdominal pain,.  - I am not sure if her dyspnea/fatigue is due to worsening/unimproved pulmonary hypertension or lung restriction from  kyphosis.  I will arrange for repeat RHC to reassess PA pressure and filling pressures.  If PA pressure remains significantly elevated, would recommend trial of inhaled Tyvaso to see if she could tolerate this better than Uptravi . We discussed risks/benefits and she agrees to procedure.  - Continue Lasix  40 mg daily, BMET/BNP today - She did not tolerate Jardiance .  - Echo in 12/25.  2. Rheumatoid arthritis:  She is not on disease-specific meds currently.  She also has Raynaud's phenomenon.  RF and CCP elevated. - Follows with rheumatology.   3. CAD: Patient had NSTEMIs in 1991 and 1998, per her report thought to be due to vasospasm.  LHC in 12/24 showed no significant coronary disease.  - Continue ASA 81  - Continue statin, good lipids in 4/25.  4. Hyperlipidemia: She is on atorvastatin , good lipids in 4/25.   5. Kyphoscoliosis: This likely contributed to her dyspnea via chest wall restriction.  6. HTN: BP controlled.  7. Abnormal CT chest: Cannot rule out bronchiolitis obliterans per report though no ILD.  Also with calcified right adrenal mass.  - Needs dedicated adrenal CT (has been ordered).  - Followup with pulmonary.   Followup 3 months.   I spent 41 minutes reviewing data, interviewing patient, and organizing the orders/followup.   Hannah Scott 02/19/2024

## 2024-02-21 DIAGNOSIS — H3582 Retinal ischemia: Secondary | ICD-10-CM | POA: Diagnosis not present

## 2024-02-21 DIAGNOSIS — H34832 Tributary (branch) retinal vein occlusion, left eye, with macular edema: Secondary | ICD-10-CM | POA: Diagnosis not present

## 2024-02-21 DIAGNOSIS — H35341 Macular cyst, hole, or pseudohole, right eye: Secondary | ICD-10-CM | POA: Diagnosis not present

## 2024-02-21 DIAGNOSIS — H35372 Puckering of macula, left eye: Secondary | ICD-10-CM | POA: Diagnosis not present

## 2024-02-26 ENCOUNTER — Encounter: Payer: Self-pay | Admitting: Radiology

## 2024-02-28 ENCOUNTER — Telehealth (HOSPITAL_COMMUNITY): Payer: Self-pay

## 2024-03-01 ENCOUNTER — Other Ambulatory Visit (HOSPITAL_COMMUNITY): Payer: Self-pay

## 2024-03-01 NOTE — Telephone Encounter (Signed)
 Advanced Heart Failure Patient Advocate Encounter  Prior authorization for Opsumit  has been submitted and approved. Test billing returns refill too soon rejection, unable to confirm copay at this time.  Key: [id number] Effective: 02/29/2024 to 02/27/2025  Rachel DEL, CPhT Rx Patient Advocate Phone: [phone]

## 2024-03-04 DIAGNOSIS — H34832 Tributary (branch) retinal vein occlusion, left eye, with macular edema: Secondary | ICD-10-CM | POA: Diagnosis not present

## 2024-03-11 ENCOUNTER — Encounter: Payer: Self-pay | Admitting: Registered Nurse

## 2024-03-11 ENCOUNTER — Encounter: Attending: Registered Nurse | Admitting: Registered Nurse

## 2024-03-11 ENCOUNTER — Other Ambulatory Visit (HOSPITAL_COMMUNITY): Payer: Self-pay

## 2024-03-11 VITALS — BP 112/68 | HR 79 | Ht <= 58 in | Wt 125.4 lb

## 2024-03-11 DIAGNOSIS — M48062 Spinal stenosis, lumbar region with neurogenic claudication: Secondary | ICD-10-CM | POA: Diagnosis not present

## 2024-03-11 DIAGNOSIS — G8929 Other chronic pain: Secondary | ICD-10-CM | POA: Insufficient documentation

## 2024-03-11 DIAGNOSIS — G894 Chronic pain syndrome: Secondary | ICD-10-CM | POA: Diagnosis not present

## 2024-03-11 DIAGNOSIS — M25561 Pain in right knee: Secondary | ICD-10-CM | POA: Insufficient documentation

## 2024-03-11 DIAGNOSIS — Z5181 Encounter for therapeutic drug level monitoring: Secondary | ICD-10-CM | POA: Diagnosis not present

## 2024-03-11 DIAGNOSIS — M24551 Contracture, right hip: Secondary | ICD-10-CM | POA: Diagnosis not present

## 2024-03-11 DIAGNOSIS — M792 Neuralgia and neuritis, unspecified: Secondary | ICD-10-CM | POA: Diagnosis not present

## 2024-03-11 DIAGNOSIS — Z79891 Long term (current) use of opiate analgesic: Secondary | ICD-10-CM | POA: Insufficient documentation

## 2024-03-11 DIAGNOSIS — M25562 Pain in left knee: Secondary | ICD-10-CM | POA: Diagnosis not present

## 2024-03-11 MED ORDER — HYDROCODONE-ACETAMINOPHEN 10-325 MG PO TABS: 1.0000 | ORAL_TABLET | Freq: Three times a day (TID) | ORAL | 0 refills | Status: DC | PRN

## 2024-03-11 NOTE — Progress Notes (Signed)
 Subjective:    Patient ID: Hannah Scott, female    DOB: April 29, 1950, 73 y.o.   MRN: [id number]  HPI: Hannah Scott is a 73 y.o. female who returns for follow up appointment for chronic pain and medication refill.She  states her pain is located in her lower back and left knee.She  rates her pain 7. Her current exercise regime is walking short distances with her walker.    Hannah Scott  equivalent is 30.00 MME.   Last Oral Swab was Performed on 01/11/2024, it was consistent.     Pain Inventory Average Pain 7 Pain Right Now 7 My pain is constant, burning, tingling, and aching  In the last 24 hours, has pain interfered with the following? General activity 10 Relation with others 10 Enjoyment of life 10 What TIME of day is your pain at its worst? morning , daytime, evening, night, and varies Sleep (in general) Poor  Pain is worse with: walking, standing, and some activites Pain improves with: rest, heat/ice, and medication Relief from Meds: 6  Family History  Problem Relation Age of Onset   Heart failure Mother    Pneumonia Mother    Hypertension Mother    Heart disease Mother    Stroke Maternal Grandfather    Hypertension Maternal Grandfather    Heart attack Father    Heart disease Father    Asthma Paternal Uncle        PAT UNCLES   Heart attack Paternal Uncle    Social History   Socioeconomic History   Marital status: Widowed    Spouse name: Not on file   Number of children: Not on file   Years of education: Not on file   Highest education level: Not on file  Occupational History   Occupation: RETIRED  Tobacco Use   Smoking status: Never    Passive exposure: Never   Smokeless tobacco: Never  Vaping Use   Vaping status: Never Used  Substance and Sexual Activity   Alcohol  use: No    Alcohol /week: 0.0 standard drinks of alcohol    Drug use: No   Sexual activity: Not Currently    Partners: Male    Birth control/protection: Surgical  Other Topics Concern   Not  on file  Social History Narrative   Widowed in 2015   2 sons 1 lives in the area the other is in close contact /2025   1 grandchild   Never smoker no drug use no alcohol    Social Drivers of Corporate Investment Banker Strain: Medium Risk (08/01/2023)   Overall Financial Resource Strain (CARDIA)    Difficulty of Paying Living Expenses: Somewhat hard  Food Insecurity: No Food Insecurity (08/01/2023)   Hunger Vital Sign    Worried About Running Out of Food in the Last Year: Never true    Ran Out of Food in the Last Year: Never true  Transportation Needs: No Transportation Needs (08/01/2023)   PRAPARE - Administrator, Civil Service (Medical): No    Lack of Transportation (Non-Medical): No  Physical Activity: Inactive (08/01/2023)   Exercise Vital Sign    Days of Exercise per Week: 0 days    Minutes of Exercise per Session: 0 min  Stress: Stress Concern Present (08/01/2023)   [employer] of Occupational Health - Occupational Stress Questionnaire    Feeling of Stress : To some extent  Social Connections: Socially Isolated (08/01/2023)   Social Connection and Isolation Panel    Frequency of  Communication with Friends and Family: More than three times a week    Frequency of Social Gatherings with Friends and Family: Once a week    Attends Religious Services: Never    Database Administrator or Organizations: No    Attends Banker Meetings: Never    Marital Status: Widowed   Past Surgical History:  Procedure Laterality Date   ANKLE SURGERY Left    ligament   APPENDECTOMY  1987   AT [facility]   BACK SURGERY     Fusion   BREAST LUMPECTOMY  2003   radiation on right   BREAST LUMPECTOMY WITH RADIOACTIVE SEED LOCALIZATION Left 02/12/2016   Procedure: LEFT BREAST LUMPECTOMY WITH RADIOACTIVE SEED LOCALIZATION;  Surgeon: Morene Olives, MD;  Location: [facility];  Service: General;  Laterality: Left;   CARDIOVASCULAR STRESS TEST  09/07/05   Nuclear, was  negative   CATARACT EXTRACTION Bilateral 01,03   COLONOSCOPY WITH PROPOFOL  N/A 05/04/2021   Procedure: COLONOSCOPY WITH PROPOFOL ;  Surgeon: Avram Lupita BRAVO, MD;  Location: [facility];  Service: Endoscopy;  Laterality: N/A;   ESOPHAGOGASTRODUODENOSCOPY (EGD) WITH PROPOFOL  N/A 05/04/2021   Procedure: ESOPHAGOGASTRODUODENOSCOPY (EGD) WITH PROPOFOL ;  Surgeon: Avram Lupita BRAVO, MD;  Location: [facility];  Service: Endoscopy;  Laterality: N/A;   OOPHORECTOMY     LSO -RSO   PELVIC LAPAROSCOPY  1989   RSO, LYSIS OF ADHESIONS   POLYPECTOMY  05/04/2021   Procedure: POLYPECTOMY;  Surgeon: Avram Lupita BRAVO, MD;  Location: [facility];  Service: Endoscopy;;   RIGHT/LEFT HEART CATH AND CORONARY ANGIOGRAPHY N/A 03/28/2023   Procedure: RIGHT/LEFT HEART CATH AND CORONARY ANGIOGRAPHY;  Surgeon: Rolan Ezra RAMAN, MD;  Location: [facility];  Service: Cardiovascular;  Laterality: N/A;   TOTAL ABDOMINAL HYSTERECTOMY  1987   LSO, APPENDECTOMY   TOTAL HIP ARTHROPLASTY Right 10/28/2013   dr barbarann   TOTAL HIP ARTHROPLASTY Right 10/28/2013   Procedure: TOTAL HIP ARTHROPLASTY ANTERIOR APPROACH;  Surgeon: Oneil JAYSON Barbarann, MD;  Location: [facility];  Service: Orthopedics;  Laterality: Right;  Right Total Hip Arthroplasty, Direct Anterior Approach   Past Surgical History:  Procedure Laterality Date   ANKLE SURGERY Left    ligament   APPENDECTOMY  1987   AT [facility]   BACK SURGERY     Fusion   BREAST LUMPECTOMY  2003   radiation on right   BREAST LUMPECTOMY WITH RADIOACTIVE SEED LOCALIZATION Left 02/12/2016   Procedure: LEFT BREAST LUMPECTOMY WITH RADIOACTIVE SEED LOCALIZATION;  Surgeon: Morene Olives, MD;  Location: [facility];  Service: General;  Laterality: Left;   CARDIOVASCULAR STRESS TEST  09/07/05   Nuclear, was negative   CATARACT EXTRACTION Bilateral 01,03   COLONOSCOPY WITH PROPOFOL  N/A 05/04/2021   Procedure: COLONOSCOPY WITH PROPOFOL ;  Surgeon: Avram Lupita BRAVO, MD;  Location: [facility];   Service: Endoscopy;  Laterality: N/A;   ESOPHAGOGASTRODUODENOSCOPY (EGD) WITH PROPOFOL  N/A 05/04/2021   Procedure: ESOPHAGOGASTRODUODENOSCOPY (EGD) WITH PROPOFOL ;  Surgeon: Avram Lupita BRAVO, MD;  Location: [facility];  Service: Endoscopy;  Laterality: N/A;   OOPHORECTOMY     LSO -RSO   PELVIC LAPAROSCOPY  1989   RSO, LYSIS OF ADHESIONS   POLYPECTOMY  05/04/2021   Procedure: POLYPECTOMY;  Surgeon: Avram Lupita BRAVO, MD;  Location: [facility];  Service: Endoscopy;;   RIGHT/LEFT HEART CATH AND CORONARY ANGIOGRAPHY N/A 03/28/2023   Procedure: RIGHT/LEFT HEART CATH AND CORONARY ANGIOGRAPHY;  Surgeon: Rolan Ezra RAMAN, MD;  Location: [facility] CV LAB;  Service:  Cardiovascular;  Laterality: N/A;   TOTAL ABDOMINAL HYSTERECTOMY  1987   LSO, APPENDECTOMY   TOTAL HIP ARTHROPLASTY Right 10/28/2013   dr barbarann   TOTAL HIP ARTHROPLASTY Right 10/28/2013   Procedure: TOTAL HIP ARTHROPLASTY ANTERIOR APPROACH;  Surgeon: Oneil JAYSON Barbarann, MD;  Location: [facility] OR;  Service: Orthopedics;  Laterality: Right;  Right Total Hip Arthroplasty, Direct Anterior Approach   Past Medical History:  Diagnosis Date   Anemia    hx   Arthritis    Asthma    PFTs, February, 2011, moderate obstructive disease with response to bronchodilators, normal lung volumes, moderate reduction in diffusing capacity   Atrial septal aneurysm    Echo, 2008-not noted on 13 echo   CAD (coronary artery disease)    90% distal LAD in the past  /   nuclear, 2008, no ischemia, ejection fraction 70%   Cancer (HCC) 2002   DUCTAL CIS--S/P LUMPECTOMY, RADIATION AND 6 WEEKS OF TAMOXIFEN   D-dimer, elevated    January, 2014   Depression    Ejection fraction    EF 60%, echo, October, 2008   Elevated CPK    January, 2014   Endometriosis 1989   RIGHT TUBE   Endometriosis 1987   LEFT TUBE/OVARY W FOCAL IN-SITU ENDOMETRIAL ADENOCARCINOMA   GERD (gastroesophageal reflux disease)    occ   H/O hiatal hernia    ?   High cholesterol    Hyperlipidemia     Hypertension    Hypothyroidism    Patient has had in the past that she does not need treatment   Kyphoscoliosis    Obstructive airway disease (HCC)    Pinched nerve    lower back   Shortness of breath    Echo 2/22: EF 60-65, no RWMA, mild LVH, GR 1  DD, GLS -24%, normal RVSF, mild MR, trivial AI, borderline dilation of aortic root (39 mm)   UTI (lower urinary tract infection)    BP 112/68 (BP Location: Left Arm, Patient Position: Sitting, Cuff Size: Normal)   Pulse 79   Ht 4' 9 (1.448 m)   Wt 125 lb 6.4 oz (56.9 kg)   SpO2 91%   BMI 27.14 kg/m   Opioid Risk Score:   Fall Risk Score:  `1  Depression screen [facility] 2/9     01/11/2024    3:25 PM 10/02/2023    2:05 PM 08/24/2023    2:14 PM 08/01/2023    3:51 PM 06/20/2023    1:37 PM 04/14/2023   10:53 AM 03/16/2023    2:16 PM  Depression screen PHQ 2/9  Decreased Interest 0 0 1 2 0 0 0  Down, Depressed, Hopeless 0 0 1 3 0 0 0  PHQ - 2 Score 0 0 2 5 0 0 0  Altered sleeping    1     Tired, decreased energy    1     Change in appetite    0     Feeling bad or failure about yourself     3     Trouble concentrating    1     Moving slowly or fidgety/restless    0     Suicidal thoughts    0     PHQ-9 Score    11      Difficult doing work/chores    Somewhat difficult        Data saved with a previous flowsheet row definition   HPI    Review of Systems  Musculoskeletal:  Positive for arthralgias and back pain.       Bilateral hand and knee pain, low back pain  All other systems reviewed and are negative.      Objective:   Physical Exam Vitals and nursing note reviewed.  Constitutional:      Appearance: Normal appearance.  Cardiovascular:     Rate and Rhythm: Normal rate and regular rhythm.     Pulses: Normal pulses.     Heart sounds: Normal heart sounds.  Pulmonary:     Effort: Pulmonary effort is normal.     Breath sounds: Normal breath sounds.  Musculoskeletal:     Comments: Normal Muscle Bulk and Muscle Testing  Reveals:  Upper Extremities: Decreased ROM 90 Degrees  and Muscle Strength 5/5 Lumbar Paraspinal Tenderness: L-4-L-5 Lower Extremities: ight: Full ROM and Muscle Strength 5/5 Left Lower Extremity: Decreased ROM and Muscle Strength 5/5 Left Lower Extremity Flexion Produces Pain into her Left Patella Arises from Table slowly using walker for support Antalgic  Gait     Skin:    General: Skin is warm and dry.  Neurological:     Mental Status: She is alert and oriented to person, place, and time.  Psychiatric:        Mood and Affect: Mood normal.        Behavior: Behavior normal.          Assessment & Plan:  1. Lumbar post laminectomy syndrome with severe kyphoscoliosis thoracolumbar spine, s/p lumbar fusion. 03/11/2024 Continue current medication regimen. Refilled:  Hydrocodone  10/325mg  one tablet every 8 hours #90. We will continue the opioid monitoring program, this consists of regular clinic visits, examinations, urine drug screen, pill counts as well as use of [place]  Controlled Substance Reporting system. A 12 month History has been reviewed on the [place]  Controlled Substance Reporting System on 03/11/2024 2. Right Hip OA: S/P Right Hip Replacement 10/28/2013. 11/17/ 2025 3.Depression: Continue Cymbalta . Has Family Support and Friends.  Counseling with Juliene. 03/11/2024 4. Bilateral Hip Pain/ Right Greater Trochanteric Tenderness: No complaints today. Continue to Monitor. Continue with Ice and Heat Therapy. 03/11/2024 5. Poor Appetite/ Frail: No complaints today. Hannah Scott states PCP following. Continue to monitor. 03/11/2024 6. Chronic Bilateral  Knee Pain: Continue HEP as Tolerated. Continue to Monitor. 03/11/2024 7. Chronic Right  shoulder pain:  Continue HEP as tolerated. Continue to Monitor. 03/11/2024  8. Neuropathic Pain: continue Gabapentin  600 mg at HS.Hannah Scott Continue to Monitor. 03/11/2024   F/U in 1 month

## 2024-03-14 ENCOUNTER — Encounter (HOSPITAL_COMMUNITY): Admission: RE | Disposition: A | Payer: Self-pay | Source: Home / Self Care | Attending: Cardiology

## 2024-03-14 ENCOUNTER — Other Ambulatory Visit: Payer: Self-pay

## 2024-03-14 ENCOUNTER — Encounter (HOSPITAL_COMMUNITY): Payer: Self-pay | Admitting: Cardiology

## 2024-03-14 ENCOUNTER — Ambulatory Visit (HOSPITAL_COMMUNITY)
Admission: RE | Admit: 2024-03-14 | Discharge: 2024-03-14 | Disposition: A | Discharge status: Home / Self Care | Attending: Cardiology | Admitting: Cardiology

## 2024-03-14 DIAGNOSIS — J45909 Unspecified asthma, uncomplicated: Secondary | ICD-10-CM | POA: Insufficient documentation

## 2024-03-14 DIAGNOSIS — I2510 Atherosclerotic heart disease of native coronary artery without angina pectoris: Secondary | ICD-10-CM | POA: Diagnosis not present

## 2024-03-14 DIAGNOSIS — Z79899 Other long term (current) drug therapy: Secondary | ICD-10-CM | POA: Insufficient documentation

## 2024-03-14 DIAGNOSIS — M069 Rheumatoid arthritis, unspecified: Secondary | ICD-10-CM | POA: Diagnosis not present

## 2024-03-14 DIAGNOSIS — Z7982 Long term (current) use of aspirin: Secondary | ICD-10-CM | POA: Insufficient documentation

## 2024-03-14 DIAGNOSIS — Z853 Personal history of malignant neoplasm of breast: Secondary | ICD-10-CM | POA: Insufficient documentation

## 2024-03-14 DIAGNOSIS — I252 Old myocardial infarction: Secondary | ICD-10-CM | POA: Insufficient documentation

## 2024-03-14 DIAGNOSIS — I2721 Secondary pulmonary arterial hypertension: Secondary | ICD-10-CM | POA: Insufficient documentation

## 2024-03-14 DIAGNOSIS — E785 Hyperlipidemia, unspecified: Secondary | ICD-10-CM | POA: Diagnosis not present

## 2024-03-14 DIAGNOSIS — I272 Pulmonary hypertension, unspecified: Secondary | ICD-10-CM

## 2024-03-14 DIAGNOSIS — M419 Scoliosis, unspecified: Secondary | ICD-10-CM | POA: Insufficient documentation

## 2024-03-14 DIAGNOSIS — I10 Essential (primary) hypertension: Secondary | ICD-10-CM | POA: Diagnosis not present

## 2024-03-14 HISTORY — PX: RIGHT HEART CATH: CATH118263

## 2024-03-14 LAB — POCT I-STAT 7, (LYTES, BLD GAS, ICA,H+H)
Acid-Base Excess: 5 mmol/L — ABNORMAL HIGH (ref 0.0–2.0)
Acid-Base Excess: 2 mmol/L (ref 0.0–2.0)
Bicarbonate: 31.8 mmol/L — ABNORMAL HIGH (ref 20.0–28.0)
Bicarbonate: 28.8 mmol/L — ABNORMAL HIGH (ref 20.0–28.0)
Calcium, Ion: 1.25 mmol/L (ref 1.15–1.40)
Calcium, Ion: 1.2 mmol/L (ref 1.15–1.40)
HCT: 40 % (ref 36.0–46.0)
HCT: 38 % (ref 36.0–46.0)
Hemoglobin: 13.6 g/dL (ref 12.0–15.0)
Hemoglobin: 12.9 g/dL (ref 12.0–15.0)
O2 Saturation: 69 %
O2 Saturation: 71 %
Potassium: 3.7 mmol/L (ref 3.5–5.1)
Potassium: 3.4 mmol/L — ABNORMAL LOW (ref 3.5–5.1)
Sodium: 138 mmol/L (ref 135–145)
Sodium: 140 mmol/L (ref 135–145)
TCO2: 33 mmol/L — ABNORMAL HIGH (ref 22–32)
TCO2: 30 mmol/L (ref 22–32)
pCO2 arterial: 57.6 mmHg — ABNORMAL HIGH (ref 32–48)
pCO2 arterial: 53.7 mmHg — ABNORMAL HIGH (ref 32–48)
pH, Arterial: 7.349 — ABNORMAL LOW (ref 7.35–7.45)
pH, Arterial: 7.338 — ABNORMAL LOW (ref 7.35–7.45)
pO2, Arterial: 39 mmHg — CL (ref 83–108)
pO2, Arterial: 41 mmHg — ABNORMAL LOW (ref 83–108)

## 2024-03-14 SURGERY — RIGHT HEART CATH
Anesthesia: LOCAL

## 2024-03-14 MED ORDER — LIDOCAINE HCL (PF) 1 % IJ SOLN
INTRAMUSCULAR | Status: AC
  Filled 2024-03-14: qty 30

## 2024-03-14 MED ORDER — HYDRALAZINE HCL 20 MG/ML IJ SOLN: 10.0000 mg | INTRAMUSCULAR | Status: DC | PRN

## 2024-03-14 MED ORDER — SODIUM CHLORIDE 0.9 % IV SOLN: 250.0000 mL | INTRAVENOUS | Status: DC | PRN

## 2024-03-14 MED ORDER — SODIUM CHLORIDE 0.9% FLUSH: 3.0000 mL | Freq: Two times a day (BID) | INTRAVENOUS | Status: DC

## 2024-03-14 MED ORDER — SODIUM CHLORIDE 0.9% FLUSH: 3.0000 mL | INTRAVENOUS | Status: DC | PRN

## 2024-03-14 MED ORDER — LABETALOL HCL 5 MG/ML IV SOLN: 10.0000 mg | INTRAVENOUS | Status: DC | PRN

## 2024-03-14 MED ORDER — FREE WATER: 250.0000 mL | Freq: Once | Status: DC

## 2024-03-14 MED ORDER — SODIUM CHLORIDE 0.9% FLUSH
3.0000 mL | INTRAVENOUS | Status: DC | PRN
Start: 2024-03-14 — End: 2024-03-14

## 2024-03-14 MED ORDER — SODIUM CHLORIDE 0.9 % IV SOLN
250.0000 mL | INTRAVENOUS | Status: DC | PRN
Start: 2024-03-14 — End: 2024-03-14

## 2024-03-14 MED ORDER — ACETAMINOPHEN 325 MG PO TABS: 650.0000 mg | ORAL_TABLET | ORAL | Status: DC | PRN

## 2024-03-14 MED ORDER — LIDOCAINE HCL (PF) 1 % IJ SOLN
INTRAMUSCULAR | Status: DC | PRN
Start: 2024-03-14 — End: 2024-03-14
  Administered 2024-03-14: 5 mL

## 2024-03-14 MED ORDER — ONDANSETRON HCL 4 MG/2ML IJ SOLN
4.0000 mg | Freq: Four times a day (QID) | INTRAMUSCULAR | Status: DC | PRN
Start: 2024-03-14 — End: 2024-03-14

## 2024-03-14 SURGICAL SUPPLY — 6 items
CATH BALLN WEDGE 5F 110CM (CATHETERS) IMPLANT
GUIDEWIRE .025 260CM (WIRE) IMPLANT
PACK CARDIAC CATHETERIZATION (CUSTOM PROCEDURE TRAY) ×1 IMPLANT
SHEATH GLIDE SLENDER 4/5FR (SHEATH) IMPLANT
TRANSDUCER W/STOPCOCK (MISCELLANEOUS) IMPLANT
TUBING ART PRESS 72 MALE/FEM (TUBING) IMPLANT

## 2024-03-14 NOTE — Interval H&P Note (Signed)
 History and Physical Interval Note:  03/14/2024 9:18 AM  Hannah Scott  has presented today for surgery, with the diagnosis of hp.  The various methods of treatment have been discussed with the patient and family. After consideration of risks, benefits and other options for treatment, the patient has consented to  Procedure(s): RIGHT HEART CATH (N/A) as a surgical intervention.  The patient's history has been reviewed, patient examined, no change in status, stable for surgery.  I have reviewed the patient's chart and labs.  Questions were answered to the patient's satisfaction.     Trystan Eads [employer]

## 2024-03-14 NOTE — Discharge Instructions (Signed)

## 2024-03-15 ENCOUNTER — Telehealth: Payer: Self-pay

## 2024-03-15 NOTE — Telephone Encounter (Signed)
 CMN received from [facility] for oxygen .  Form has been signed and faxed back successfully.

## 2024-03-28 ENCOUNTER — Ambulatory Visit (HOSPITAL_COMMUNITY): Admitting: Cardiology

## 2024-04-02 ENCOUNTER — Ambulatory Visit (HOSPITAL_COMMUNITY)
Admission: RE | Admit: 2024-04-02 | Discharge: 2024-04-02 | Disposition: A | Source: Ambulatory Visit | Attending: Cardiology | Admitting: Cardiology

## 2024-04-02 DIAGNOSIS — I083 Combined rheumatic disorders of mitral, aortic and tricuspid valves: Secondary | ICD-10-CM | POA: Diagnosis not present

## 2024-04-02 DIAGNOSIS — I272 Pulmonary hypertension, unspecified: Secondary | ICD-10-CM | POA: Diagnosis not present

## 2024-04-02 DIAGNOSIS — I5032 Chronic diastolic (congestive) heart failure: Secondary | ICD-10-CM | POA: Diagnosis not present

## 2024-04-02 DIAGNOSIS — I77810 Thoracic aortic ectasia: Secondary | ICD-10-CM | POA: Diagnosis not present

## 2024-04-02 DIAGNOSIS — E785 Hyperlipidemia, unspecified: Secondary | ICD-10-CM | POA: Diagnosis not present

## 2024-04-02 DIAGNOSIS — I2510 Atherosclerotic heart disease of native coronary artery without angina pectoris: Secondary | ICD-10-CM | POA: Diagnosis not present

## 2024-04-02 DIAGNOSIS — I110 Hypertensive heart disease with heart failure: Secondary | ICD-10-CM | POA: Diagnosis not present

## 2024-04-02 LAB — ECHOCARDIOGRAM COMPLETE
Area-P 1/2: 2.83 cm2
Calc EF: 50.9 %
S' Lateral: 2.6 cm
Single Plane A2C EF: 53.8 %
Single Plane A4C EF: 49.3 %

## 2024-04-08 DIAGNOSIS — H34832 Tributary (branch) retinal vein occlusion, left eye, with macular edema: Secondary | ICD-10-CM | POA: Diagnosis not present

## 2024-04-10 ENCOUNTER — Other Ambulatory Visit (HOSPITAL_COMMUNITY): Payer: Self-pay | Admitting: Cardiology

## 2024-04-11 ENCOUNTER — Encounter: Payer: Self-pay | Admitting: Registered Nurse

## 2024-04-11 ENCOUNTER — Encounter: Attending: Registered Nurse | Admitting: Registered Nurse

## 2024-04-11 ENCOUNTER — Other Ambulatory Visit: Payer: Self-pay | Admitting: Registered Nurse

## 2024-04-11 VITALS — BP 107/57 | HR 91 | Ht 60.0 in | Wt 123.0 lb

## 2024-04-11 DIAGNOSIS — Z79891 Long term (current) use of opiate analgesic: Secondary | ICD-10-CM | POA: Diagnosis not present

## 2024-04-11 DIAGNOSIS — M792 Neuralgia and neuritis, unspecified: Secondary | ICD-10-CM

## 2024-04-11 DIAGNOSIS — M48062 Spinal stenosis, lumbar region with neurogenic claudication: Secondary | ICD-10-CM

## 2024-04-11 DIAGNOSIS — G894 Chronic pain syndrome: Secondary | ICD-10-CM | POA: Diagnosis not present

## 2024-04-11 DIAGNOSIS — G8929 Other chronic pain: Secondary | ICD-10-CM | POA: Insufficient documentation

## 2024-04-11 DIAGNOSIS — M24551 Contracture, right hip: Secondary | ICD-10-CM

## 2024-04-11 DIAGNOSIS — M25562 Pain in left knee: Secondary | ICD-10-CM | POA: Diagnosis not present

## 2024-04-11 DIAGNOSIS — M25561 Pain in right knee: Secondary | ICD-10-CM | POA: Insufficient documentation

## 2024-04-11 MED ORDER — GABAPENTIN 100 MG PO CAPS: ORAL_CAPSULE | ORAL | 2 refills | Status: AC

## 2024-04-11 MED ORDER — GABAPENTIN 600 MG PO TABS: ORAL_TABLET | ORAL | 1 refills | Status: AC

## 2024-04-11 NOTE — Progress Notes (Unsigned)
 Subjective:    Patient ID: Hannah Scott, female    DOB: 05/11/1950, 73 y.o.   MRN: [id number]  HPI: Hannah Scott is a 73 y.o. female who returns for follow up appointment for chronic pain and medication refill. states *** pain is located in  ***. rates pain ***. current exercise regime is walking and performing stretching exercises.  Hannah Scott  equivalent is *** MME.      Pain Inventory Average Pain 7 Pain Right Now 6 My pain is constant, burning, dull, and aching  In the last 24 hours, has pain interfered with the following? General activity 10 Relation with others 10 Enjoyment of life 10 What TIME of day is your pain at its worst? morning , daytime, evening, and night Sleep (in general) Poor  Pain is worse with: walking, bending, and standing Pain improves with: rest, heat/ice, and medication Relief from Meds: 5  Family History  Problem Relation Age of Onset   Heart failure Mother    Pneumonia Mother    Hypertension Mother    Heart disease Mother    Stroke Maternal Grandfather    Hypertension Maternal Grandfather    Heart attack Father    Heart disease Father    Asthma Paternal Uncle        PAT UNCLES   Heart attack Paternal Uncle    Social History   Socioeconomic History   Marital status: Widowed    Spouse name: Not on file   Number of children: Not on file   Years of education: Not on file   Highest education level: Not on file  Occupational History   Occupation: RETIRED  Tobacco Use   Smoking status: Never    Passive exposure: Never   Smokeless tobacco: Never  Vaping Use   Vaping status: Never Used  Substance and Sexual Activity   Alcohol  use: No    Alcohol /week: 0.0 standard drinks of alcohol    Drug use: No   Sexual activity: Not Currently    Partners: Male    Birth control/protection: Surgical  Other Topics Concern   Not on file  Social History Narrative   Widowed in 2015   2 sons 1 lives in the area the other is in close contact /2025    1 grandchild   Never smoker no drug use no alcohol    Social Drivers of Health   Tobacco Use: Low Risk (04/11/2024)   Patient History    Smoking Tobacco Use: Never    Smokeless Tobacco Use: Never    Passive Exposure: Never  Financial Resource Strain: Medium Risk (08/01/2023)   Overall Financial Resource Strain (CARDIA)    Difficulty of Paying Living Expenses: Somewhat hard  Food Insecurity: No Food Insecurity (08/01/2023)   Hunger Vital Sign    Worried About Running Out of Food in the Last Year: Never true    Ran Out of Food in the Last Year: Never true  Transportation Needs: No Transportation Needs (08/01/2023)   PRAPARE - Administrator, Civil Service (Medical): No    Lack of Transportation (Non-Medical): No  Physical Activity: Inactive (08/01/2023)   Exercise Vital Sign    Days of Exercise per Week: 0 days    Minutes of Exercise per Session: 0 min  Stress: Stress Concern Present (08/01/2023)   [employer] of Occupational Health - Occupational Stress Questionnaire    Feeling of Stress : To some extent  Social Connections: Socially Isolated (08/01/2023)   Social Connection and Isolation  Panel    Frequency of Communication with Friends and Family: More than three times a week    Frequency of Social Gatherings with Friends and Family: Once a week    Attends Religious Services: Never    Database Administrator or Organizations: No    Attends Banker Meetings: Never    Marital Status: Widowed  Depression (PHQ2-9): Low Risk (01/11/2024)   Depression (PHQ2-9)    PHQ-2 Score: 0  Alcohol  Screen: Low Risk (08/01/2023)   Alcohol  Screen    Last Alcohol  Screening Score (AUDIT): 0  Housing: Unknown (08/01/2023)   Housing Stability Vital Sign    Unable to Pay for Housing in the Last Year: No    Number of Times Moved in the Last Year: Not on file    Homeless in the Last Year: No  Utilities: Not At Risk (08/01/2023)   AHC Utilities    Threatened with loss of utilities: No   Health Literacy: Adequate Health Literacy (08/01/2023)   B1300 Health Literacy    Frequency of need for help with medical instructions: Never   Past Surgical History:  Procedure Laterality Date   ANKLE SURGERY Left    ligament   APPENDECTOMY  1987   AT [facility]   BACK SURGERY     Fusion   BREAST LUMPECTOMY  2003   radiation on right   BREAST LUMPECTOMY WITH RADIOACTIVE SEED LOCALIZATION Left 02/12/2016   Procedure: LEFT BREAST LUMPECTOMY WITH RADIOACTIVE SEED LOCALIZATION;  Surgeon: Morene Olives, MD;  Location: [facility];  Service: General;  Laterality: Left;   CARDIOVASCULAR STRESS TEST  09/07/05   Nuclear, was negative   CATARACT EXTRACTION Bilateral 01,03   COLONOSCOPY WITH PROPOFOL  N/A 05/04/2021   Procedure: COLONOSCOPY WITH PROPOFOL ;  Surgeon: Avram Lupita BRAVO, MD;  Location: [facility];  Service: Endoscopy;  Laterality: N/A;   ESOPHAGOGASTRODUODENOSCOPY (EGD) WITH PROPOFOL  N/A 05/04/2021   Procedure: ESOPHAGOGASTRODUODENOSCOPY (EGD) WITH PROPOFOL ;  Surgeon: Avram Lupita BRAVO, MD;  [facility];  Service: Endoscopy;  Laterality: N/A;   OOPHORECTOMY     LSO -RSO   PELVIC LAPAROSCOPY  1989   RSO, LYSIS OF ADHESIONS   POLYPECTOMY  05/04/2021   Procedure: POLYPECTOMY;  Surgeon: Avram Lupita BRAVO, MD;  [facility]: [facility];  Service: Endoscopy;;   RIGHT HEART CATH N/A 03/14/2024   Procedure: RIGHT HEART CATH;  Surgeon: Rolan Ezra RAMAN, MD;  Location: [facility] INVASIVE CV LAB;  Service: Cardiovascular;  Laterality: N/A;   RIGHT/LEFT HEART CATH AND CORONARY ANGIOGRAPHY N/A 03/28/2023   Procedure: RIGHT/LEFT HEART CATH AND CORONARY ANGIOGRAPHY;  Surgeon: Rolan Ezra RAMAN, MD;  Location: [facility] INVASIVE CV LAB;  Service: Cardiovascular;  Laterality: N/A;   TOTAL ABDOMINAL HYSTERECTOMY  1987   LSO, APPENDECTOMY   TOTAL HIP ARTHROPLASTY Right 10/28/2013   dr barbarann   TOTAL HIP ARTHROPLASTY Right 10/28/2013   Procedure: TOTAL HIP ARTHROPLASTY ANTERIOR APPROACH;  Surgeon: Oneil JAYSON Barbarann, MD;  [facility]: [facility];  Service: Orthopedics;  Laterality: Right;  Right Total Hip Arthroplasty, Direct Anterior Approach   Past Surgical History:  Procedure Laterality Date   ANKLE SURGERY Left    ligament   APPENDECTOMY  1987   AT [facility]   BACK SURGERY     Fusion   BREAST LUMPECTOMY  2003   radiation on right   BREAST LUMPECTOMY WITH RADIOACTIVE SEED LOCALIZATION Left 02/12/2016   Procedure: LEFT BREAST LUMPECTOMY WITH RADIOACTIVE SEED LOCALIZATION;  Surgeon: Morene Olives, MD;  Location:  [facility]ERY  [facility];  Service: General;  Laterality: Left;   CARDIOVASCULAR STRESS TEST  09/07/05   Nuclear, was negative   CATARACT EXTRACTION Bilateral 01,03   COLONOSCOPY WITH PROPOFOL  N/A 05/04/2021   Procedure: COLONOSCOPY WITH PROPOFOL ;  Surgeon: Avram Lupita BRAVO, MD;  [facility];  Service: Endoscopy;  Laterality: N/A;   ESOPHAGOGASTRODUODENOSCOPY (EGD) WITH PROPOFOL  N/A 05/04/2021   Procedure: ESOPHAGOGASTRODUODENOSCOPY (EGD) WITH PROPOFOL ;  Surgeon: Avram Lupita BRAVO, MD;  [facility];  Service: Endoscopy;  Laterality: N/A;   OOPHORECTOMY     LSO -RSO   PELVIC LAPAROSCOPY  1989   RSO, LYSIS OF ADHESIONS   POLYPECTOMY  05/04/2021   Procedure: POLYPECTOMY;  Surgeon: Avram Lupita BRAVO, MD;  [facility];  Service: Endoscopy;;   RIGHT HEART CATH N/A 03/14/2024   Procedure: RIGHT HEART CATH;  Surgeon: Rolan Ezra RAMAN, MD;  Location: [facility] INVASIVE CV LAB;  Service: Cardiovascular;  Laterality: N/A;   RIGHT/LEFT HEART CATH AND CORONARY ANGIOGRAPHY N/A 03/28/2023   Procedure: RIGHT/LEFT HEART CATH AND CORONARY ANGIOGRAPHY;  Surgeon: Rolan Ezra RAMAN, MD;  Location: [facility] INVASIVE CV LAB;  Service: Cardiovascular;  Laterality: N/A;   TOTAL ABDOMINAL HYSTERECTOMY  1987   LSO, APPENDECTOMY   TOTAL HIP ARTHROPLASTY Right 10/28/2013   dr barbarann   TOTAL HIP ARTHROPLASTY Right 10/28/2013   Procedure: TOTAL HIP ARTHROPLASTY ANTERIOR APPROACH;  Surgeon: Oneil JAYSON Barbarann, MD;   [facility];  Service: Orthopedics;  Laterality: Right;  Right Total Hip Arthroplasty, Direct Anterior Approach   Past Medical History:  Diagnosis Date   Anemia    hx   Arthritis    Asthma    PFTs, February, 2011, moderate obstructive disease with response to bronchodilators, normal lung volumes, moderate reduction in diffusing capacity   Atrial septal aneurysm    Echo, 2008-not noted on 13 echo   CAD (coronary artery disease)    90% distal LAD in the past  /   nuclear, 2008, no ischemia, ejection fraction 70%   Cancer (HCC) 2002   DUCTAL CIS--S/P LUMPECTOMY, RADIATION AND 6 WEEKS OF TAMOXIFEN   D-dimer, elevated    January, 2014   Depression    Ejection fraction    EF 60%, echo, October, 2008   Elevated CPK    January, 2014   Endometriosis 1989   RIGHT TUBE   Endometriosis 1987   LEFT TUBE/OVARY W FOCAL IN-SITU ENDOMETRIAL ADENOCARCINOMA   GERD (gastroesophageal reflux disease)    occ   H/O hiatal hernia    ?   High cholesterol    Hyperlipidemia    Hypertension    Hypothyroidism    Patient has had in the past that she does not need treatment   Kyphoscoliosis    Obstructive airway disease (HCC)    Pinched nerve    lower back   Shortness of breath    Echo 2/22: EF 60-65, no RWMA, mild LVH, GR 1  DD, GLS -24%, normal RVSF, mild MR, trivial AI, borderline dilation of aortic root (39 mm)   UTI (lower urinary tract infection)    BP (!) 107/57   Pulse 91   Ht 5' (1.524 m)   Wt 123 lb (55.8 kg)   SpO2 93%   BMI 24.02 kg/m   Opioid Risk Score:   Fall Risk Score:  `1  Depression screen [facility] 2/9     01/11/2024    3:25 PM 10/02/2023    2:05 PM 08/24/2023    2:14 PM 08/01/2023    3:51  PM 06/20/2023    1:37 PM 04/14/2023   10:53 AM 03/16/2023    2:16 PM  Depression screen PHQ 2/9  Decreased Interest 0 0 1 2 0 0 0  Down, Depressed, Hopeless 0 0 1 3 0 0 0  PHQ - 2 Score 0 0 2 5 0 0 0  Altered sleeping    1     Tired, decreased energy    1     Change in appetite    0      Feeling bad or failure about yourself     3     Trouble concentrating    1     Moving slowly or fidgety/restless    0     Suicidal thoughts    0     PHQ-9 Score    11      Difficult doing work/chores    Somewhat difficult        Data saved with a previous flowsheet row definition      Review of Systems  Musculoskeletal:        Right hip pain, bilateral knee pain, low back pain  All other systems reviewed and are negative.      Objective:   Physical Exam        Assessment & Plan:

## 2024-04-13 NOTE — Addendum Note (Signed)
 Addended by: DEBBY FIDELA CROME on: 04/13/2024 07:17 PM   Modules accepted: Level of Service

## 2024-04-16 ENCOUNTER — Ambulatory Visit: Admitting: Adult Health

## 2024-04-18 ENCOUNTER — Other Ambulatory Visit: Payer: Self-pay | Admitting: Internal Medicine

## 2024-04-26 ENCOUNTER — Other Ambulatory Visit (HOSPITAL_COMMUNITY): Payer: Self-pay | Admitting: Cardiology

## 2024-04-26 DIAGNOSIS — E782 Mixed hyperlipidemia: Secondary | ICD-10-CM

## 2024-04-26 DIAGNOSIS — Z79899 Other long term (current) drug therapy: Secondary | ICD-10-CM

## 2024-04-26 DIAGNOSIS — I25119 Atherosclerotic heart disease of native coronary artery with unspecified angina pectoris: Secondary | ICD-10-CM

## 2024-04-26 DIAGNOSIS — I272 Pulmonary hypertension, unspecified: Secondary | ICD-10-CM

## 2024-05-09 ENCOUNTER — Telehealth: Payer: Self-pay

## 2024-05-09 ENCOUNTER — Encounter: Admitting: Registered Nurse

## 2024-05-09 NOTE — Telephone Encounter (Signed)
 Patients appointment has been changed to phone appointment for 05/10/24. Reviewed with NP and okay to change appointment to phone visit.   Patient has COPD and due to cold, windy dry air it is best if patient stays inside to prevent any irritation to her lungs and breathing.

## 2024-05-10 ENCOUNTER — Encounter: Payer: Self-pay | Admitting: *Deleted

## 2024-05-10 ENCOUNTER — Encounter: Payer: Self-pay | Admitting: Registered Nurse

## 2024-05-10 ENCOUNTER — Encounter: Attending: Registered Nurse | Admitting: Registered Nurse

## 2024-05-10 VITALS — BP 103/53 | HR 78 | Ht 60.0 in | Wt 125.0 lb

## 2024-05-10 DIAGNOSIS — M24551 Contracture, right hip: Secondary | ICD-10-CM | POA: Insufficient documentation

## 2024-05-10 DIAGNOSIS — M25561 Pain in right knee: Secondary | ICD-10-CM | POA: Diagnosis not present

## 2024-05-10 DIAGNOSIS — Z79891 Long term (current) use of opiate analgesic: Secondary | ICD-10-CM | POA: Insufficient documentation

## 2024-05-10 DIAGNOSIS — Z5181 Encounter for therapeutic drug level monitoring: Secondary | ICD-10-CM | POA: Diagnosis not present

## 2024-05-10 DIAGNOSIS — G894 Chronic pain syndrome: Secondary | ICD-10-CM | POA: Insufficient documentation

## 2024-05-10 DIAGNOSIS — M25562 Pain in left knee: Secondary | ICD-10-CM | POA: Insufficient documentation

## 2024-05-10 DIAGNOSIS — M792 Neuralgia and neuritis, unspecified: Secondary | ICD-10-CM | POA: Diagnosis not present

## 2024-05-10 DIAGNOSIS — M48062 Spinal stenosis, lumbar region with neurogenic claudication: Secondary | ICD-10-CM | POA: Diagnosis not present

## 2024-05-10 DIAGNOSIS — G8929 Other chronic pain: Secondary | ICD-10-CM | POA: Insufficient documentation

## 2024-05-10 MED ORDER — HYDROCODONE-ACETAMINOPHEN 10-325 MG PO TABS: 1.0000 | ORAL_TABLET | Freq: Three times a day (TID) | ORAL | 0 refills | Status: AC | PRN

## 2024-05-10 NOTE — Progress Notes (Signed)
 "  Subjective:    Patient ID: Hannah Scott, female    DOB: 16-May-1950, 74 y.o.   MRN: [id number]  HPI: Hannah Scott is a 74 y.o. female whose appointment was changed to telephone visit , due to asthma exacerbation she reports. I connected with Hannah Scott at by telephone and verified that I am speaking with the correct person using two identifiers.  Location: Patient: In her home  Provider: In the Office    I discussed the limitations, risks, security and privacy concerns of performing an evaluation and management service by telephone and the availability of in person appointments. I also discussed with the patient that there may be a patient responsible charge related to this service. The patient expressed understanding and agreed to proceed.  She states her pain is located in her lower back  and bilateral knee pain. She rates her pain 7. Her current exercise regime is walking and performing stretching exercises.  Hannah Scott Morphine  equivalent is 30.00 MME.        Pain Inventory Average Pain 7 Pain Right Now 7 My pain is constant, sharp, tingling, and aching  In the last 24 hours, has pain interfered with the following? General activity 10 Relation with others 10 Enjoyment of life 10 What TIME of day is your pain at its worst? morning , daytime, evening, and night Sleep (in general) Poor  Pain is worse with: walking, bending, sitting, inactivity, standing, and some activites Pain improves with: rest and medication Relief from Meds: 5  Family History  Problem Relation Age of Onset   Heart failure Mother    Pneumonia Mother    Hypertension Mother    Heart disease Mother    Stroke Maternal Grandfather    Hypertension Maternal Grandfather    Heart attack Father    Heart disease Father    Asthma Paternal Uncle        PAT UNCLES   Heart attack Paternal Uncle    Social History   Socioeconomic History   Marital status: Widowed    Spouse name: Not on file   Number of  children: Not on file   Years of education: Not on file   Highest education level: Not on file  Occupational History   Occupation: RETIRED  Tobacco Use   Smoking status: Never    Passive exposure: Never   Smokeless tobacco: Never  Vaping Use   Vaping status: Never Used  Substance and Sexual Activity   Alcohol  use: No    Alcohol /week: 0.0 standard drinks of alcohol    Drug use: No   Sexual activity: Not Currently    Partners: Male    Birth control/protection: Surgical  Other Topics Concern   Not on file  Social History Narrative   Widowed in 2015   2 sons 1 lives in the area the other is in close contact /2025   1 grandchild   Never smoker no drug use no alcohol    Social Drivers of Health   Tobacco Use: Low Risk (05/10/2024)   Patient History    Smoking Tobacco Use: Never    Smokeless Tobacco Use: Never    Passive Exposure: Never  Financial Resource Strain: Medium Risk (08/01/2023)   Overall Financial Resource Strain (CARDIA)    Difficulty of Paying Living Expenses: Somewhat hard  Food Insecurity: No Food Insecurity (08/01/2023)   Hunger Vital Sign    Worried About Running Out of Food in the Last Year: Never true  Ran Out of Food in the Last Year: Never true  Transportation Needs: No Transportation Needs (08/01/2023)   PRAPARE - Administrator, Civil Service (Medical): No    Lack of Transportation (Non-Medical): No  Physical Activity: Inactive (08/01/2023)   Exercise Vital Sign    Days of Exercise per Week: 0 days    Minutes of Exercise per Session: 0 min  Stress: Stress Concern Present (08/01/2023)   Hannah Scott    Feeling of Stress : To some extent  Social Connections: Socially Isolated (08/01/2023)   Social Connection and Isolation Panel    Frequency of Communication with Friends and Family: More than three times a week    Frequency of Social Gatherings with Friends and Family: Once a week     Attends Religious Services: Never    Database Administrator or Organizations: No    Attends Banker Meetings: Never    Marital Status: Widowed  Depression (PHQ2-9): Low Risk (01/11/2024)   Depression (PHQ2-9)    PHQ-2 Score: 0  Alcohol  Screen: Low Risk (08/01/2023)   Alcohol  Screen    Last Alcohol  Screening Score (AUDIT): 0  Housing: Unknown (08/01/2023)   Housing Stability Vital Sign    Unable to Pay for Housing in the Last Year: No    Number of Times Moved in the Last Year: Not on file    Homeless in the Last Year: No  Utilities: Not At Risk (08/01/2023)   AHC Utilities    Threatened with loss of utilities: No  Health Literacy: Adequate Health Literacy (08/01/2023)   B1300 Health Literacy    Frequency of need for help with medical instructions: Never   Past Surgical History:  Procedure Laterality Date   ANKLE SURGERY Left    ligament   APPENDECTOMY  1987   AT [facility]   BACK SURGERY     Fusion   BREAST LUMPECTOMY  2003   radiation on right   BREAST LUMPECTOMY WITH RADIOACTIVE SEED LOCALIZATION Left 02/12/2016   Procedure: LEFT BREAST LUMPECTOMY WITH RADIOACTIVE SEED LOCALIZATION;  Surgeon: Morene Olives, MD;  Location: [facility];  Service: General;  Laterality: Left;   CARDIOVASCULAR STRESS TEST  09/07/05   Nuclear, was negative   CATARACT EXTRACTION Bilateral 01,03   COLONOSCOPY WITH PROPOFOL  N/A 05/04/2021   Procedure: COLONOSCOPY WITH PROPOFOL ;  Surgeon: Avram Lupita BRAVO, MD;  [facility];  Service: Endoscopy;  Laterality: N/A;   ESOPHAGOGASTRODUODENOSCOPY (EGD) WITH PROPOFOL  N/A 05/04/2021   Procedure: ESOPHAGOGASTRODUODENOSCOPY (EGD) WITH PROPOFOL ;  Surgeon: Avram Lupita BRAVO, MD;  Location: [facility];  Service: Endoscopy;  Laterality: N/A;   OOPHORECTOMY     LSO -RSO   PELVIC LAPAROSCOPY  1989   [facility]O, LYSIS OF ADHESIONS   POLYPECTOMY  05/04/2021   Procedure: POLYPECTOMY;  Surgeon: Avram Lupita BRAVO, MD;  [facility];  Service:  Endoscopy;;   RIGHT HEART CATH N/A 03/14/2024   Procedure: RIGHT HEART CATH;  Surgeon: Rolan Ezra RAMAN, MD;  Location: [facility] INVASIVE CV LAB;  Service: Cardiovascular;  Laterality: N/A;   RIGHT/LEFT HEART CATH AND CORONARY ANGIOGRAPHY N/A 03/28/2023   Procedure: RIGHT/LEFT HEART CATH AND CORONARY ANGIOGRAPHY;  Surgeon: Rolan Ezra RAMAN, MD;  Location: [facility] INV[facility] CV LAB;  Service: Cardiovascular;  Laterality: N/A;   TOTAL ABDOMINAL HYSTERECTOMY  1987   [facility], APPENDECTOMY   TOTAL HIP ARTHROPLASTY Right 10/28/2013   dr barbarann   TOTAL HIP ARTHROPLASTY Right 10/28/2013  Procedure: TOTAL HIP ARTHROPLASTY ANTERIOR APPROACH;  Surgeon: Oneil JAYSON Herald, MD;  Location: [facility];  Service: Orthopedics;  Laterality: Right;  Right Total Hip Arthroplasty, Direct Anterior Approach   Past Surgical History:  Procedure Laterality Date   ANKLE SURGERY Left    ligament   APPENDECTOMY  1987   AT [facility]   BACK SURGERY     Fusion   BREAST LUMPECTOMY  2003   radiation on right   BREAST LUMPECTOMY WITH RADIOACTIVE SEED LOCALIZATION Left 02/12/2016   Procedure: LEFT BREAST LUMPECTOMY WITH RADIOACTIVE SEED LOCALIZATION;  Surgeon: Morene Olives, MD;  Location: [facility];  Service: General;  Laterality: Left;   CARDIOVASCULAR STRESS TEST  09/07/05   Nuclear, was negative   CATARACT EXTRACTION Bilateral 01,03   COLONOSCOPY WITH PROPOFOL  N/A 05/04/2021   Procedure: COLONOSCOPY WITH PROPOFOL ;  Surgeon: Avram Lupita BRAVO, MD;  [facility];  Service: Endoscopy;  Laterality: N/A;   ESOPHAGOGASTRODUODENOSCOPY (EGD) WITH PROPOFOL  N/A 05/04/2021   Procedure: ESOPHAGOGASTRODUODENOSCOPY (EGD) WITH PROPOFOL ;  Surgeon: Avram Lupita BRAVO, MD;  [facility];  Service: Endoscopy;  Laterality: N/A;   OOPHORECTOMY     LSO -RSO   PELVIC LAPAROSCOPY  1989   RSO, LYSIS OF ADHESIONS   POLYPECTOMY  05/04/2021   Procedure: POLYPECTOMY;  Surgeon: Avram Lupita BRAVO, MD;  [facility];  Service: Endoscopy;;    RIGHT HEART CATH N/A 03/14/2024   Procedure: RIGHT HEART CATH;  Surgeon: Rolan Ezra RAMAN, MD;  [facility] INVAS[facility] CV LAB;  Service: Cardiovascular;  Laterality: N/A;   RIGHT/LEFT HEART CATH AND CORONARY ANGIOGRAPHY N/A 03/28/2023   Procedure: RIGHT/LEFT HEART CATH AND CORONARY ANGIOGRAPHY;  Surgeon: Rolan Ezra RAMAN, MD;  Location: [facility] INVASIVE CV LAB;  Service: Cardiovascular;  Laterality: N/A;   TOTAL ABDOMINAL HYSTERECTOMY  1987   LSO, APPENDECTOMY   TOTAL HIP ARTHROPLASTY Right 10/28/2013   dr herald   TOTAL HIP ARTHROPLASTY Right 10/28/2013   Procedure: TOTAL HIP ARTHROPLASTY ANTERIOR APPROACH;  Surgeon: Oneil JAYSON Herald, MD;  Location: [facility];  Service: Orthopedics;  Laterality: Right;  Right Total Hip Arthroplasty, Direct Anterior Approach   Past Medical History:  Diagnosis Date   Anemia    hx   Arthritis    Asthma    PFTs, February, 2011, moderate obstructive disease with response to bronchodilators, normal lung volumes, moderate reduction in diffusing capacity   Atrial septal aneurysm    Echo, 2008-not noted on 13 echo   CAD (coronary artery disease)    90% distal LAD in the past  /   nuclear, 2008, no ischemia, ejection fraction 70%   Cancer (HCC) 2002   DUCTAL CIS--S/P LUMPECTOMY, RADIATION AND 6 WEEKS OF TAMOXIFEN   D-dimer, elevated    January, 2014   Depression    Ejection fraction    EF 60%, echo, October, 2008   Elevated CPK    January, 2014   Endometriosis 1989   RIGHT TUBE   Endometriosis 1987   LEFT TUBE/OVARY W FOCAL IN-SITU ENDOMETRIAL ADENOCARCINOMA   GERD (gastroesophageal reflux disease)    occ   H/O hiatal hernia    ?   High cholesterol    Hyperlipidemia    Hypertension    Hypothyroidism    Patient has had in the past that she does not need treatment   Kyphoscoliosis    Obstructive airway disease (HCC)    Pinched nerve    lower back   Shortness of breath    Echo 2/22: EF  60-65, no RWMA, mild LVH, GR 1  DD, GLS -24%, normal RVSF, mild MR, trivial AI,  borderline dilation of aortic root (39 mm)   UTI (lower urinary tract infection)    BP (!) 103/53 Comment: patient obtained BP  Pulse 78   Ht 5' (1.524 m)   Wt 125 lb (56.7 kg)   SpO2 (!) 89%   BMI 24.41 kg/m   Opioid Risk Score:   Fall Risk Score:  `1  Depression screen [facility] 2/9     01/11/2024    3:25 PM 10/02/2023    2:05 PM 08/24/2023    2:14 PM 08/01/2023    3:51 PM 06/20/2023    1:37 PM 04/14/2023   10:53 AM 03/16/2023    2:16 PM  Depression screen PHQ 2/9  Decreased Interest 0 0 1 2 0 0 0  Down, Depressed, Hopeless 0 0 1 3 0 0 0  PHQ - 2 Score 0 0 2 5 0 0 0  Altered sleeping    1     Tired, decreased energy    1     Change in appetite    0     Feeling bad or failure about yourself     3     Trouble concentrating    1     Moving slowly or fidgety/restless    0     Suicidal thoughts    0     PHQ-9 Score    11      Difficult doing work/chores    Somewhat difficult        Data saved with a previous flowsheet row definition    Review of Systems  Musculoskeletal:  Positive for arthralgias, back pain and myalgias.       Right shoulder pain, low back pain bilateral knee pain  All other systems reviewed and are negative.      Objective:   Physical Exam Vitals and nursing note reviewed.  Musculoskeletal:     Comments: No Physical Exam : Telephone Visit           Assessment & Plan:  1. Lumbar post laminectomy syndrome with severe kyphoscoliosis thoracolumbar spine, s/p lumbar fusion. 05/10/2024 Continue current medication regimen. Refilled:  Hydrocodone  10/325mg  one tablet every 8 hours #90. We will continue the opioid monitoring program, this consists of regular clinic visits, examinations, urine drug screen, pill counts as well as use of [place]  Controlled Substance Reporting system. A 12 month History has been reviewed on the [place]  Controlled Substance Reporting System on 05/10/2024 2. Right Hip OA: S/P Right Hip Replacement 10/28/2013. 01/16/  2026 3.Depression: Continue Cymbalta . Has Family Support and Friends.  Counseling with Juliene. 05/10/2024 4. Bilateral Hip Pain/ Right Greater Trochanteric Tenderness: No complaints today. Continue to Monitor. Continue with Ice and Heat Therapy. 05/10/2024 5. Poor Appetite/ Frail: No complaints today. Hannah Scott states PCP following. Continue to monitor. 05/10/2024 6. Chronic Bilateral  Knee Pain: Continue HEP as Tolerated. Continue to Monitor. 05/10/2024 7. Chronic Right  shoulder pain:  No complaints today. Continue HEP as tolerated. Continue to Monitor. 05/10/2024  8. Neuropathic Pain: continue Gabapentin  600 mg at HS.Hannah Scott Continue to Monitor. 05/10/2024   F/U in 1 month Telephone visit Established Patient Location of Patient: In her Home Location of provider: In the Office     "

## 2024-05-10 NOTE — Progress Notes (Signed)
 MAIA HANDA                                          MRN: [id number]   05/10/2024   The [facility] Quality Team Specialist reviewed this patient medical record for the purposes of chart review for care gap closure. The following were reviewed: chart review for care gap closure-kidney health evaluation for diabetes:eGFR  and uACR.    [facility]I Quality Team

## 2024-05-13 ENCOUNTER — Encounter: Admitting: Registered Nurse

## 2024-05-16 ENCOUNTER — Encounter: Payer: Self-pay | Admitting: Internal Medicine

## 2024-05-16 ENCOUNTER — Ambulatory Visit: Attending: Internal Medicine | Admitting: Internal Medicine

## 2024-05-16 VITALS — BP 101/59 | HR 71 | Temp 98.4°F | Resp 17 | Ht 60.0 in | Wt 130.5 lb

## 2024-05-16 DIAGNOSIS — M059 Rheumatoid arthritis with rheumatoid factor, unspecified: Secondary | ICD-10-CM | POA: Diagnosis not present

## 2024-05-16 DIAGNOSIS — R200 Anesthesia of skin: Secondary | ICD-10-CM

## 2024-05-16 MED ORDER — PREDNISONE 5 MG PO TABS: ORAL_TABLET | ORAL | 0 refills | Status: AC

## 2024-05-16 NOTE — Progress Notes (Signed)
 "  Office Visit Note  Patient: Hannah Scott             Date of Birth: 1951/01/20           MRN: [id number]             PCP: Geofm Glade JINNY, MD Referring: Geofm Glade JINNY, MD Visit Date: 05/16/2024   Subjective:  Pain (Bilateral hand pain and swelling also burning and tingling more in the right hand than the left )  Discussed the use of AI scribe software for clinical note transcription with the patient, who gave verbal consent to proceed.  History of Present Illness   Hannah Scott is a 74 year old female with severe carpal tunnel syndrome and history of rheumatoid arthritis who presents with worsening hand symptoms. She was referred to Dr. Eldonna for a nerve conduction study which confirmed severe carpal tunnel syndrome.  She has been experiencing symptoms of carpal tunnel syndrome for a long time. Symptoms have been present for a long time but have worsened recently. She describes episodes of significant hand swelling and sensations of her fingers being submerged in hot water , causing intense stinging and burning. These episodes occur intermittently, with the most recent lasting from Sunday to the following night. During these episodes, she experiences numbness and difficulty moving her fingers and picking up objects.  She also has a history of rheumatoid arthritis, which she believes contributes to her symptoms. She describes episodes of swelling and fever in her hands, associating them with rheumatoid arthritis flares. She is not currently on any medication for rheumatoid arthritis. She recalls taking prednisone  in the past for asthma but has not used it recently.  No current medication for rheumatoid arthritis. She experiences swelling, burning, and stinging sensations in her fingers, particularly during episodes of hand swelling. She notes numbness and difficulty picking up objects.      Previous HPI 11/27/23 Hannah Scott is a 74 year old female with osteoarthritis who presents with joint  pain and numbness in her hands.   She has a history of joint pain and stiffness, primarily affecting her hands, shoulders, and feet. She also experiences pain in her shoulder, particularly at night, and difficulty raising her shoulders.   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 These episodes occurred years ago and have not been bothersome recently. She has been suggested for rheumatoid arthritis based on antibody markers, but recent lab tests showed normal inflammation levels, and x-rays revealed degenerative changes without any particular damage indicative for RA.   She experiences numbness and tingling in her hands, particularly affecting the thumb and first three fingers, which has progressively worsened. The sensation is described as 'tingly' and similar to the feeling when 'there's feeling trying to come back into it after you've been to the dentist.' No numbness in her feet is reported. She has not had a nerve study done previously and reports difficulty gripping objects due to the numbness.       Previous HPI 08/21/23 Hannah Scott is a 74 year old female here for evaluation and management of her arthritis with an unclear RA history and highly positive RF and CCP Abs.   She has a long-standing history of rheumatoid arthritis, diagnosed in her late twenties or thirties, initially presenting with transient joint swelling and redness. These episodes were confirmed by blood tests at the  time. Over the years, her symptoms subsided significantly, and she has not been on specific rheumatoid arthritis medications for a long time. Currently, she is experiencing significant issues with her feet, particularly her toes, which have become deformed over the past two to three months. She describes her toes as 'going over' and one toe hitting the floor when she walks, causing discomfort.  This is the worst symptom she is experiencing at the moment.   She reports numbness in the first three fingers of her right hand, describing it as feeling 'like when you've been to the dentist.' This numbness is persistent and affects her ability to hold objects. No similar symptoms in her left hand, although her little finger tingles occasionally.   Her shoulder pain has been present for the past two to three months, with occasional aching and a 'squishy' sound. She has not had any procedures or injections for her joints, except for her back, which has received shots in the past.   She is currently taking hydrocodone  10 mg up to three times a day for back pain, gabapentin  600 mg at night, and duloxetine  90 mg daily (30 mg and 60 mg tablets). She has used prednisone  in the past for asthma, which also alleviated her joint symptoms. No numbness in her feet but notes some swelling, particularly in her right foot. She has not experienced recent oral steroid use for her arthritis.       Labs reviewed 03/2023 RF 395.5 CCP >250 Centromere, Scl-70 neg   08/2021 HAV/HBV/HCV neg   Imaging reviewed 07/17/23 HRCT Chest IMPRESSION: 1. No evidence of fibrotic interstitial lung disease. 2. Mosaic attenuation with air trapping, indicative of small airways disease. Difficult to exclude a component of bronchiolitis obliterans. 3. Cholelithiasis. 4. Coarsely calcified right adrenal mass, stable. This could be further evaluated with CT abdomen without and contrast, as clinically indicated. 5. Aortic atherosclerosis (ICD10-I70.0). Coronary artery calcification. 6. Enlarged pulmonic trunk, indicative of pulmonary arterial hypertension.   03/28/23 R/L Heart Cath 1. Minimal coronary disease.  2. Normal filling pressures (mean PCWP 14) 3. Severe pulmonary arterial hypertension with PVR 10.4 WU.    Review of Systems  Constitutional:  Positive for fatigue.  HENT:  Positive for mouth dryness. Negative for  mouth sores.   Eyes:  Negative for dryness.  Respiratory:  Positive for shortness of breath.   Cardiovascular:  Negative for chest pain and palpitations.  Gastrointestinal:  Negative for blood in stool, constipation and diarrhea.  Endocrine: Negative for increased urination.  Genitourinary:  Negative for involuntary urination.  Musculoskeletal:  Positive for joint pain, joint pain, joint swelling, myalgias, morning stiffness and myalgias. Negative for gait problem, muscle weakness and muscle tenderness.  Skin:  Negative for color change, rash, hair loss and sensitivity to sunlight.  Allergic/Immunologic: Negative for susceptible to infections.  Neurological:  Positive for dizziness. Negative for headaches.  Hematological:  Negative for swollen glands.  Psychiatric/Behavioral:  Positive for depressed mood and sleep disturbance. The patient is nervous/anxious.     PMFS History:  Patient Active Problem List   Diagnosis Date Noted   Bilateral hand numbness 11/27/2023   Rheumatoid factor positive 11/27/2023   Generalized osteoarthritis of multiple sites 11/27/2023   Vasospasm 03/13/2023   Chronic respiratory failure (HCC) 03/13/2023   Leg cramping 12/16/2022   B12 deficiency 01/22/2022   Zinc  deficiency 01/19/2022   Chronic diastolic heart failure (HCC) 01/19/2022   Severe pulmonary hypertension (HCC) 01/19/2022   Abnormal CT of the chest 12/01/2021  Anemia due to zinc  deficiency 11/02/2021   LVH (left ventricular hypertrophy) 10/29/2021   Deficiency anemia 10/29/2021   Iron deficiency anemia secondary to inadequate dietary iron intake 10/29/2021   Stage 3b chronic kidney disease (HCC) 10/29/2021   Acute cystitis with hematuria    NSTEMI (non-ST elevated myocardial infarction) [facility])    Esophageal dysphagia    Benign neoplasm of ascending colon    Benign neoplasm of descending colon    Kyphoscoliosis 03/30/2021   GERD (gastroesophageal reflux disease) 01/11/2021   Vitamin D   deficiency 01/11/2021   Insomnia 09/11/2019   Murmur, cardiac 01/16/2018   Moderate persistent asthma 12/13/2017   Osteoporosis 01/07/2017   Diabetes mellitus without complication (HCC) 07/07/2016   History of breast cancer 01/06/2016   Intraductal papilloma of left breast 01/06/2016   Essential hypertension 06/08/2015   DOE (dyspnea on exertion) 06/08/2015   Fatigue 05/20/2015   Back pain 04/22/2015   Multinodular goiter 12/01/2011   Hypothyroidism    CAD (coronary artery disease)    Atrial septal aneurysm    Hyperlipidemia    Spinal stenosis, lumbar region, with neurogenic claudication 07/01/2011   Lumbar postlaminectomy syndrome 07/01/2011   Osteoarthritis of right hip 07/01/2011   Contracture of right hip 07/01/2011   Endometriosis    Depression 12/24/2009   Migraine headache 05/19/2006    Past Medical History:  Diagnosis Date   Anemia    hx   Arthritis    Asthma    PFTs, February, 2011, moderate obstructive disease with response to bronchodilators, normal lung volumes, moderate reduction in diffusing capacity   Atrial septal aneurysm    Echo, 2008-not noted on 13 echo   CAD (coronary artery disease)    90% distal LAD in the past  /   nuclear, 2008, no ischemia, ejection fraction 70%   Cancer (HCC) 2002   DUCTAL CIS--S/P LUMPECTOMY, RADIATION AND 6 WEEKS OF TAMOXIFEN   D-dimer, elevated    January, 2014   Depression    Ejection fraction    EF 60%, echo, October, 2008   Elevated CPK    January, 2014   Endometriosis 1989   RIGHT TUBE   Endometriosis 1987   LEFT TUBE/OVARY W FOCAL IN-SITU ENDOMETRIAL ADENOCARCINOMA   GERD (gastroesophageal reflux disease)    occ   H/O hiatal hernia    ?   High cholesterol    Hyperlipidemia    Hypertension    Hypothyroidism    Patient has had in the past that she does not need treatment   Kyphoscoliosis    Obstructive airway disease (HCC)    Pinched nerve    lower back   Shortness of breath    Echo 2/22: EF 60-65, no  RWMA, mild LVH, GR 1  DD, GLS -24%, normal RVSF, mild MR, trivial AI, borderline dilation of aortic root (39 mm)   UTI (lower urinary tract infection)     Family History  Problem Relation Age of Onset   Heart failure Mother    Pneumonia Mother    Hypertension Mother    Heart disease Mother    Stroke Maternal Grandfather    Hypertension Maternal Grandfather    Heart attack Father    Heart disease Father    Asthma Paternal Uncle        PAT UNCLES   Heart attack Paternal Uncle    Past Surgical History:  Procedure Laterality Date   ANKLE SURGERY Left    ligament   APPENDECTOMY  1987   AT [facility]  BACK SURGERY     Fusion   BREAST LUMPECTOMY  2003   radiation on right   BREAST LUMPECTOMY WITH RADIOACTIVE SEED LOCALIZATION Left 02/12/2016   Procedure: LEFT BREAST LUMPECTOMY WITH RADIOACTIVE SEED LOCALIZATION;  Surgeon: Morene Olives, MD;  Location: [facility];  Service: General;  Laterality: Left;   CARDIOVASCULAR STRESS TEST  09/07/05   Nuclear, was negative   CATARACT EXTRACTION Bilateral 01,03   COLONOSCOPY WITH PROPOFOL  N/A 05/04/2021   Procedure: COLONOSCOPY WITH PROPOFOL ;  Surgeon: Avram Lupita BRAVO, MD;  Location: [facility];  Service: Endoscopy;  Laterality: N/A;   ESOPHAGOGASTRODUODENOSCOPY (EGD) WITH PROPOFOL  N/A 05/04/2021   Procedure: ESOPHAGOGASTRODUODENOSCOPY (EGD) WITH PROPOFOL ;  Surgeon: Avram Lupita BRAVO, MD;  Location: [facility];  Service: Endoscopy;  Laterality: N/A;   OOPHORECTOMY     LSO -RSO   PELVIC LAPAROSCOPY  1989   RSO, LYSIS OF ADHESIONS   POLYPECTOMY  05/04/2021   Procedure: POLYPECTOMY;  Surgeon: Avram Lupita BRAVO, MD;  Location: [facility];  Service: Endoscopy;;   RIGHT HEART CATH N/A 03/14/2024   Procedure: RIGHT HEART CATH;  Surgeon: Rolan Ezra RAMAN, MD;  Location[facility] INVAS[facility] CV LAB;  Service: Cardiovascular;  Laterality: N/A;   RIGHT/LEFT HEART CATH AND CORONARY ANGIOGRAPHY N/A 03/28/2023   Procedure: RIGHT/LEFT HEART CATH AND  CORONARY ANGIOGRAPHY;  Surgeon: Rolan Ezra RAMAN, MD;  Location: [facility] INVASIVE CV LAB;  Service: Cardiovascular;  Laterality: N/A;   TOTAL ABDOMINAL HYSTERECTOMY  1987   LSO, APPENDECTOMY   TOTAL HIP ARTHROPLASTY Right 10/28/2013   dr barbarann   TOTAL HIP ARTHROPLASTY Right 10/28/2013   Procedure: TOTAL HIP ARTHROPLASTY ANTERIOR APPROACH;  Surgeon: Oneil JAYSON Barbarann, MD;  Location: [facility];  Service: Orthopedics;  Laterality: Right;  Right Total Hip Arthroplasty, Direct Anterior Approach   Social History   Social History Narrative   Widowed in 2015   2 sons 1 lives in the area the other is in close contact /2025   1 grandchild   Never smoker no drug use no alcohol    Immunization History  Administered Date(s) Administered   Fluad Quad(high Dose 65+) 01/04/2019, 01/20/2022   Fluad Trivalent(High Dose 65+) 02/07/2023   INFLUENZA, HIGH DOSE SEASONAL PF 01/06/2016, 01/10/2017, 03/16/2018   Influenza Inj Mdck Quad Pf 04/09/2021   Influenza Split 03/23/2011, 02/15/2012   Influenza Whole 02/12/2003, 12/24/2009   Influenza,inj,Quad PF,6+ Mos 01/29/2013, 01/03/2014, 02/11/2015   Influenza-Unspecified 04/09/2021   Moderna Covid-19 Vaccine Bivalent Booster 62yrs & up 03/26/2021   Moderna SARS-COV2 Booster Vaccination 04/10/2020   Moderna Sars-Covid-2 Vaccination 06/21/2019, 07/24/2019   Pneumococcal Conjugate-13 05/06/2015   Pneumococcal Polysaccharide-23 07/06/2016   Tdap 01/09/2019     Objective: Vital Signs: BP (!) 101/59   Pulse 71   Temp 98.4 F (36.9 C)   Resp 17   Ht 5' (1.524 m)   Wt 130 lb 8 oz (59.2 kg)   BMI 25.49 kg/m    Physical Exam Eyes:     Conjunctiva/sclera: Conjunctivae normal.  Cardiovascular:     Rate and Rhythm: Normal rate and regular rhythm.  Pulmonary:     Effort: Pulmonary effort is normal.     Breath sounds: Normal breath sounds.  Lymphadenopathy:     Cervical: No cervical adenopathy.  Skin:    General: Skin is warm and dry.     Findings: No rash.   Neurological:     Mental Status: She is alert.  Psychiatric:        Mood and Affect: Mood normal.  Musculoskeletal Exam:  Right shoulder tenderness on anterior side of joint, full range of motion, some pain with full overhead abduction Elbows full ROM no tenderness or swelling Wrists full ROM, right wrist pain with percussion radiating into hand, no palpable swelling Distal Heberden's nodes throughout both hands, first CMC squaring on both hands, left thumb with nonreducible MCP subluxation, right 3rd finger painful with resisted flexion Knees full ROM, anterior joint line tenderness   Investigation: No additional findings.  Imaging: No results found.  Recent Labs: Lab Results  Component Value Date   WBC 5.6 02/19/2024   HGB 13.6 03/14/2024   HGB 12.9 03/14/2024   PLT 203 02/19/2024   NA 138 03/14/2024   NA 140 03/14/2024   K 3.7 03/14/2024   K 3.4 (L) 03/14/2024   CL 100 02/19/2024   CO2 30 02/19/2024   GLUCOSE 92 02/19/2024   BUN 20 02/19/2024   CREATININE 0.69 02/19/2024   BILITOT 0.7 11/10/2023   ALKPHOS 71 11/10/2023   AST 22 11/10/2023   ALT 14 11/10/2023   PROT 5.9 (L) 11/10/2023   ALBUMIN  3.6 11/10/2023   CALCIUM  9.2 02/19/2024   GFRAA 79 06/05/2020    Speciality Comments: No specialty comments available.  Procedures:  No procedures performed Allergies: Fluticasone -salmeterol, Norvasc  [amlodipine  besylate], Tape, Heparin , and Morphine    Assessment / Plan:     Visit Diagnoses:  Assessment & Plan Seropositive rheumatoid arthritis (HCC) Episodic flares with significant hand swelling and pain, exacerbated by wrist swelling. Previous tests showed no significant inflammation. Long-term medications not recommended. - Prescribed prednisone  5 mg tablets for as-needed use during flares. - Advised taking 5-10 mg of prednisone  at symptom onset. Orders:   predniSONE  (DELTASONE ) 5 MG tablet; Take 1-2 tablets daily as needed for hand pain and  swelling  Bilateral hand numbness Severe bilateral carpal tunnel syndrome confirmed by nerve conduction study. Symptoms include burning and numbness due to nerve compression, exacerbated by rheumatoid arthritis.Discussed trying US  guided steroid injection she prefers trying oral medication for short term management. Long term will benefit form orthopedic consultation given severe disease with motor involvement. - Referred to orthopedic surgeon for evaluation and potential surgical intervention.  Orders:   predniSONE  (DELTASONE ) 5 MG tablet; Take 1-2 tablets daily as needed for hand pain and swelling     Follow-Up Instructions: Return in about 3 months (around 08/14/2024) for RA/CTS on GC f/u 3mos.   Lonni LELON Ester, MD  Note - This record has been created using [employer].  Chart creation errors have been sought, but may not always  have been located. Such creation errors do not reflect on  the standard of medical care. "

## 2024-05-20 ENCOUNTER — Other Ambulatory Visit: Payer: Self-pay | Admitting: Internal Medicine

## 2024-05-25 NOTE — Assessment & Plan Note (Signed)
 Severe bilateral carpal tunnel syndrome confirmed by nerve conduction study. Symptoms include burning and numbness due to nerve compression, exacerbated by rheumatoid arthritis.Discussed trying US  guided steroid injection she prefers trying oral medication for short term management. Long term will benefit form orthopedic consultation given severe disease with motor involvement. - Referred to orthopedic surgeon for evaluation and potential surgical intervention.  Orders:   predniSONE  (DELTASONE ) 5 MG tablet; Take 1-2 tablets daily as needed for hand pain and swelling

## 2024-05-27 ENCOUNTER — Ambulatory Visit: Admitting: Adult Health

## 2024-06-11 ENCOUNTER — Encounter: Admitting: Registered Nurse

## 2024-07-11 ENCOUNTER — Ambulatory Visit: Admitting: Adult Health

## 2024-08-01 ENCOUNTER — Ambulatory Visit

## 2024-08-14 ENCOUNTER — Ambulatory Visit: Admitting: Internal Medicine

## 2024-08-28 ENCOUNTER — Ambulatory Visit
